# Patient Record
Sex: Female | Born: 1935 | Race: White | Hispanic: No | Smoking: Former smoker
Health system: Southern US, Community
[De-identification: ages and names within clinical notes are randomized; demographics above are authoritative.]

## PROBLEM LIST (undated history)

## (undated) DIAGNOSIS — B029 Zoster without complications: Secondary | ICD-10-CM

## (undated) DIAGNOSIS — I2510 Atherosclerotic heart disease of native coronary artery without angina pectoris: Secondary | ICD-10-CM

## (undated) DIAGNOSIS — K219 Gastro-esophageal reflux disease without esophagitis: Secondary | ICD-10-CM

## (undated) DIAGNOSIS — I10 Essential (primary) hypertension: Secondary | ICD-10-CM

## (undated) DIAGNOSIS — T4145XA Adverse effect of unspecified anesthetic, initial encounter: Secondary | ICD-10-CM

## (undated) DIAGNOSIS — F329 Major depressive disorder, single episode, unspecified: Secondary | ICD-10-CM

## (undated) DIAGNOSIS — M1990 Unspecified osteoarthritis, unspecified site: Secondary | ICD-10-CM

## (undated) DIAGNOSIS — R209 Unspecified disturbances of skin sensation: Secondary | ICD-10-CM

## (undated) DIAGNOSIS — K649 Unspecified hemorrhoids: Secondary | ICD-10-CM

## (undated) DIAGNOSIS — E785 Hyperlipidemia, unspecified: Secondary | ICD-10-CM

## (undated) DIAGNOSIS — K589 Irritable bowel syndrome without diarrhea: Secondary | ICD-10-CM

## (undated) DIAGNOSIS — C801 Malignant (primary) neoplasm, unspecified: Secondary | ICD-10-CM

## (undated) DIAGNOSIS — G4700 Insomnia, unspecified: Secondary | ICD-10-CM

## (undated) DIAGNOSIS — R0600 Dyspnea, unspecified: Secondary | ICD-10-CM

## (undated) DIAGNOSIS — J449 Chronic obstructive pulmonary disease, unspecified: Secondary | ICD-10-CM

## (undated) DIAGNOSIS — Z8719 Personal history of other diseases of the digestive system: Secondary | ICD-10-CM

## (undated) DIAGNOSIS — K5730 Diverticulosis of large intestine without perforation or abscess without bleeding: Secondary | ICD-10-CM

## (undated) DIAGNOSIS — M25559 Pain in unspecified hip: Secondary | ICD-10-CM

## (undated) DIAGNOSIS — E039 Hypothyroidism, unspecified: Secondary | ICD-10-CM

## (undated) DIAGNOSIS — M545 Low back pain: Secondary | ICD-10-CM

## (undated) DIAGNOSIS — I700 Atherosclerosis of aorta: Secondary | ICD-10-CM

## (undated) DIAGNOSIS — D649 Anemia, unspecified: Secondary | ICD-10-CM

## (undated) DIAGNOSIS — T8859XA Other complications of anesthesia, initial encounter: Secondary | ICD-10-CM

## (undated) DIAGNOSIS — F411 Generalized anxiety disorder: Secondary | ICD-10-CM

## (undated) DIAGNOSIS — R51 Headache: Secondary | ICD-10-CM

## (undated) HISTORY — DX: Pain in unspecified hip: M25.559

## (undated) HISTORY — PX: HEMORRHOID SURGERY: SHX153

## (undated) HISTORY — DX: Unspecified disturbances of skin sensation: R20.9

## (undated) HISTORY — DX: Hypothyroidism, unspecified: E03.9

## (undated) HISTORY — PX: BILATERAL SALPINGOOPHORECTOMY: SHX1223

## (undated) HISTORY — DX: Headache: R51

## (undated) HISTORY — DX: Diverticulosis of large intestine without perforation or abscess without bleeding: K57.30

## (undated) HISTORY — PX: TONSILLECTOMY: SHX5217

## (undated) HISTORY — DX: Essential (primary) hypertension: I10

## (undated) HISTORY — DX: Insomnia, unspecified: G47.00

## (undated) HISTORY — DX: Major depressive disorder, single episode, unspecified: F32.9

## (undated) HISTORY — DX: Generalized anxiety disorder: F41.1

## (undated) HISTORY — PX: APPENDECTOMY: SHX54

## (undated) HISTORY — DX: Gastro-esophageal reflux disease without esophagitis: K21.9

## (undated) HISTORY — DX: Low back pain: M54.5

## (undated) HISTORY — DX: Personal history of other diseases of the digestive system: Z87.19

## (undated) HISTORY — PX: ABDOMINAL HYSTERECTOMY: SHX81

## (undated) HISTORY — DX: Irritable bowel syndrome, unspecified: K58.9

## (undated) HISTORY — DX: Hyperlipidemia, unspecified: E78.5

---

## 1999-06-26 ENCOUNTER — Inpatient Hospital Stay (HOSPITAL_COMMUNITY): Admission: EM | Admit: 1999-06-26 | Discharge: 1999-06-27 | Payer: Self-pay | Admitting: Emergency Medicine

## 1999-06-26 ENCOUNTER — Encounter: Payer: Self-pay | Admitting: Gastroenterology

## 1999-09-16 ENCOUNTER — Encounter: Admission: RE | Admit: 1999-09-16 | Discharge: 1999-09-16 | Payer: Self-pay | Admitting: Gastroenterology

## 2000-01-10 ENCOUNTER — Encounter: Admission: RE | Admit: 2000-01-10 | Discharge: 2000-01-10 | Payer: Self-pay | Admitting: *Deleted

## 2000-01-10 ENCOUNTER — Encounter: Payer: Self-pay | Admitting: *Deleted

## 2000-01-17 ENCOUNTER — Encounter: Admission: RE | Admit: 2000-01-17 | Discharge: 2000-01-17 | Payer: Self-pay | Admitting: *Deleted

## 2000-01-17 ENCOUNTER — Encounter: Payer: Self-pay | Admitting: *Deleted

## 2000-07-29 ENCOUNTER — Encounter (INDEPENDENT_AMBULATORY_CARE_PROVIDER_SITE_OTHER): Payer: Self-pay | Admitting: Specialist

## 2000-07-29 ENCOUNTER — Ambulatory Visit (HOSPITAL_COMMUNITY): Admission: RE | Admit: 2000-07-29 | Discharge: 2000-07-29 | Payer: Self-pay | Admitting: Gastroenterology

## 2001-04-02 ENCOUNTER — Encounter: Payer: Self-pay | Admitting: Emergency Medicine

## 2001-04-02 ENCOUNTER — Emergency Department (HOSPITAL_COMMUNITY): Admission: EM | Admit: 2001-04-02 | Discharge: 2001-04-02 | Payer: Self-pay | Admitting: Emergency Medicine

## 2004-09-27 ENCOUNTER — Encounter: Admission: RE | Admit: 2004-09-27 | Discharge: 2004-09-27 | Payer: Self-pay | Admitting: Internal Medicine

## 2004-10-01 ENCOUNTER — Encounter: Admission: RE | Admit: 2004-10-01 | Discharge: 2004-10-01 | Payer: Self-pay | Admitting: Internal Medicine

## 2004-12-08 DIAGNOSIS — B029 Zoster without complications: Secondary | ICD-10-CM

## 2004-12-08 HISTORY — DX: Zoster without complications: B02.9

## 2004-12-20 ENCOUNTER — Encounter (INDEPENDENT_AMBULATORY_CARE_PROVIDER_SITE_OTHER): Payer: Self-pay | Admitting: *Deleted

## 2004-12-20 ENCOUNTER — Inpatient Hospital Stay (HOSPITAL_COMMUNITY): Admission: RE | Admit: 2004-12-20 | Discharge: 2004-12-22 | Payer: Self-pay | Admitting: Obstetrics and Gynecology

## 2004-12-23 ENCOUNTER — Inpatient Hospital Stay (HOSPITAL_COMMUNITY): Admission: AD | Admit: 2004-12-23 | Discharge: 2004-12-23 | Payer: Self-pay | Admitting: Obstetrics and Gynecology

## 2005-11-22 ENCOUNTER — Emergency Department (HOSPITAL_COMMUNITY): Admission: EM | Admit: 2005-11-22 | Discharge: 2005-11-22 | Payer: Self-pay | Admitting: Emergency Medicine

## 2005-12-27 ENCOUNTER — Inpatient Hospital Stay (HOSPITAL_COMMUNITY): Admission: EM | Admit: 2005-12-27 | Discharge: 2006-01-02 | Payer: Self-pay | Admitting: Emergency Medicine

## 2006-09-22 ENCOUNTER — Encounter: Admission: RE | Admit: 2006-09-22 | Discharge: 2006-09-22 | Payer: Self-pay | Admitting: Gastroenterology

## 2006-09-25 ENCOUNTER — Encounter: Admission: RE | Admit: 2006-09-25 | Discharge: 2006-09-25 | Payer: Self-pay | Admitting: Gastroenterology

## 2006-10-06 ENCOUNTER — Encounter: Payer: Self-pay | Admitting: Internal Medicine

## 2007-01-20 ENCOUNTER — Encounter: Admission: RE | Admit: 2007-01-20 | Discharge: 2007-01-20 | Payer: Self-pay | Admitting: Internal Medicine

## 2007-04-28 ENCOUNTER — Encounter: Admission: RE | Admit: 2007-04-28 | Discharge: 2007-04-28 | Payer: Self-pay | Admitting: Internal Medicine

## 2007-11-19 ENCOUNTER — Encounter: Admission: RE | Admit: 2007-11-19 | Discharge: 2007-11-19 | Payer: Self-pay | Admitting: Internal Medicine

## 2009-03-21 ENCOUNTER — Encounter: Payer: Self-pay | Admitting: Internal Medicine

## 2009-07-23 ENCOUNTER — Ambulatory Visit: Payer: Self-pay | Admitting: Internal Medicine

## 2009-07-23 DIAGNOSIS — K5730 Diverticulosis of large intestine without perforation or abscess without bleeding: Secondary | ICD-10-CM | POA: Insufficient documentation

## 2009-07-23 DIAGNOSIS — E785 Hyperlipidemia, unspecified: Secondary | ICD-10-CM | POA: Insufficient documentation

## 2009-07-23 DIAGNOSIS — F411 Generalized anxiety disorder: Secondary | ICD-10-CM

## 2009-07-23 DIAGNOSIS — I10 Essential (primary) hypertension: Secondary | ICD-10-CM | POA: Insufficient documentation

## 2009-07-23 DIAGNOSIS — R519 Headache, unspecified: Secondary | ICD-10-CM | POA: Insufficient documentation

## 2009-07-23 DIAGNOSIS — R51 Headache: Secondary | ICD-10-CM | POA: Insufficient documentation

## 2009-07-23 DIAGNOSIS — F329 Major depressive disorder, single episode, unspecified: Secondary | ICD-10-CM

## 2009-07-23 DIAGNOSIS — K219 Gastro-esophageal reflux disease without esophagitis: Secondary | ICD-10-CM

## 2009-07-23 DIAGNOSIS — E039 Hypothyroidism, unspecified: Secondary | ICD-10-CM | POA: Insufficient documentation

## 2009-07-23 DIAGNOSIS — F419 Anxiety disorder, unspecified: Secondary | ICD-10-CM | POA: Insufficient documentation

## 2009-07-23 DIAGNOSIS — F3289 Other specified depressive episodes: Secondary | ICD-10-CM

## 2009-07-23 HISTORY — DX: Generalized anxiety disorder: F41.1

## 2009-07-23 HISTORY — DX: Other specified depressive episodes: F32.89

## 2009-07-23 HISTORY — DX: Hypothyroidism, unspecified: E03.9

## 2009-07-23 HISTORY — DX: Gastro-esophageal reflux disease without esophagitis: K21.9

## 2009-07-23 HISTORY — DX: Headache: R51

## 2009-07-23 HISTORY — DX: Hyperlipidemia, unspecified: E78.5

## 2009-07-23 HISTORY — DX: Major depressive disorder, single episode, unspecified: F32.9

## 2009-07-23 HISTORY — DX: Diverticulosis of large intestine without perforation or abscess without bleeding: K57.30

## 2009-07-23 HISTORY — DX: Essential (primary) hypertension: I10

## 2009-08-21 ENCOUNTER — Ambulatory Visit: Payer: Self-pay | Admitting: Internal Medicine

## 2009-10-01 ENCOUNTER — Ambulatory Visit: Payer: Self-pay | Admitting: Internal Medicine

## 2009-10-01 DIAGNOSIS — R209 Unspecified disturbances of skin sensation: Secondary | ICD-10-CM

## 2009-10-01 DIAGNOSIS — M545 Low back pain, unspecified: Secondary | ICD-10-CM

## 2009-10-01 HISTORY — DX: Unspecified disturbances of skin sensation: R20.9

## 2009-10-01 HISTORY — DX: Low back pain, unspecified: M54.50

## 2010-01-07 ENCOUNTER — Ambulatory Visit: Payer: Self-pay | Admitting: Internal Medicine

## 2010-01-07 ENCOUNTER — Telehealth: Payer: Self-pay | Admitting: Internal Medicine

## 2010-01-07 DIAGNOSIS — N390 Urinary tract infection, site not specified: Secondary | ICD-10-CM | POA: Insufficient documentation

## 2010-01-07 LAB — CONVERTED CEMR LAB
Bilirubin Urine: NEGATIVE
Glucose, Urine, Semiquant: NEGATIVE
Ketones, urine, test strip: NEGATIVE
Nitrite: POSITIVE
Specific Gravity, Urine: <1.005
Urobilinogen, UA: 0.2
pH: 7

## 2010-01-10 ENCOUNTER — Encounter: Payer: Self-pay | Admitting: Internal Medicine

## 2010-01-10 ENCOUNTER — Telehealth (INDEPENDENT_AMBULATORY_CARE_PROVIDER_SITE_OTHER): Payer: Self-pay

## 2010-01-15 ENCOUNTER — Encounter: Admission: RE | Admit: 2010-01-15 | Discharge: 2010-01-15 | Payer: Self-pay | Admitting: Internal Medicine

## 2010-01-17 ENCOUNTER — Telehealth: Payer: Self-pay | Admitting: Internal Medicine

## 2010-02-04 ENCOUNTER — Telehealth: Payer: Self-pay | Admitting: Internal Medicine

## 2010-03-25 ENCOUNTER — Telehealth: Payer: Self-pay | Admitting: Internal Medicine

## 2010-04-11 ENCOUNTER — Telehealth: Payer: Self-pay | Admitting: Internal Medicine

## 2010-04-16 ENCOUNTER — Ambulatory Visit: Payer: Self-pay | Admitting: Internal Medicine

## 2010-05-16 ENCOUNTER — Telehealth: Payer: Self-pay | Admitting: Internal Medicine

## 2010-05-16 DIAGNOSIS — M25559 Pain in unspecified hip: Secondary | ICD-10-CM | POA: Insufficient documentation

## 2010-05-16 HISTORY — DX: Pain in unspecified hip: M25.559

## 2010-05-22 ENCOUNTER — Ambulatory Visit: Payer: Self-pay | Admitting: Internal Medicine

## 2010-06-13 ENCOUNTER — Telehealth: Payer: Self-pay | Admitting: Internal Medicine

## 2010-07-25 ENCOUNTER — Ambulatory Visit: Payer: Self-pay | Admitting: Internal Medicine

## 2010-08-20 ENCOUNTER — Encounter: Payer: Self-pay | Admitting: Internal Medicine

## 2010-08-21 ENCOUNTER — Encounter: Payer: Self-pay | Admitting: Internal Medicine

## 2010-09-17 ENCOUNTER — Telehealth: Payer: Self-pay | Admitting: Internal Medicine

## 2010-11-04 ENCOUNTER — Telehealth: Payer: Self-pay | Admitting: Internal Medicine

## 2010-11-15 ENCOUNTER — Encounter: Payer: Self-pay | Admitting: Internal Medicine

## 2010-11-15 ENCOUNTER — Ambulatory Visit: Payer: Self-pay | Admitting: Internal Medicine

## 2010-11-15 LAB — CONVERTED CEMR LAB
ALT: 9 units/L (ref 0–35)
AST: 18 units/L (ref 0–37)
Albumin: 4.2 g/dL (ref 3.5–5.2)
Alkaline Phosphatase: 56 units/L (ref 39–117)
BUN: 10 mg/dL (ref 6–23)
Basophils Absolute: 0 10*3/uL (ref 0.0–0.1)
Basophils Relative: 0.5 % (ref 0.0–3.0)
Bilirubin Urine: NEGATIVE
Bilirubin, Direct: 0.1 mg/dL (ref 0.0–0.3)
CO2: 30 meq/L (ref 19–32)
Calcium: 9.4 mg/dL (ref 8.4–10.5)
Chloride: 101 meq/L (ref 96–112)
Cholesterol: 200 mg/dL (ref 0–200)
Creatinine, Ser: 0.8 mg/dL (ref 0.4–1.2)
Eosinophils Absolute: 0.1 10*3/uL (ref 0.0–0.7)
Eosinophils Relative: 1.2 % (ref 0.0–5.0)
GFR calc non Af Amer: 80.42 mL/min (ref >60.00)
Glucose, Bld: 92 mg/dL (ref 70–99)
Glucose, Urine, Semiquant: NEGATIVE
HCT: 35.3 % — ABNORMAL LOW (ref 36.0–46.0)
HDL: 107.9 mg/dL (ref >39.00)
Hemoglobin: 11.7 g/dL — ABNORMAL LOW (ref 12.0–15.0)
Ketones, urine, test strip: NEGATIVE
LDL Cholesterol: 56 mg/dL (ref 0–99)
Lymphocytes Relative: 30.3 % (ref 12.0–46.0)
Lymphs Abs: 2.4 10*3/uL (ref 0.7–4.0)
MCHC: 33.2 g/dL (ref 30.0–36.0)
MCV: 97.3 fL (ref 78.0–100.0)
Monocytes Absolute: 0.6 10*3/uL (ref 0.1–1.0)
Monocytes Relative: 7.2 % (ref 3.0–12.0)
Neutro Abs: 4.8 10*3/uL (ref 1.4–7.7)
Neutrophils Relative %: 60.8 % (ref 43.0–77.0)
Nitrite: NEGATIVE
Platelets: 346 10*3/uL (ref 150.0–400.0)
Potassium: 3.9 meq/L (ref 3.5–5.1)
Protein, U semiquant: NEGATIVE
RBC: 3.63 M/uL — ABNORMAL LOW (ref 3.87–5.11)
RDW: 13.6 % (ref 11.5–14.6)
Sodium: 141 meq/L (ref 135–145)
Specific Gravity, Urine: 1.01
TSH: 1.05 microintl units/mL (ref 0.35–5.50)
Total Bilirubin: 0.5 mg/dL (ref 0.3–1.2)
Total CHOL/HDL Ratio: 2
Total Protein: 7.2 g/dL (ref 6.0–8.3)
Triglycerides: 182 mg/dL — ABNORMAL HIGH (ref 0.0–149.0)
Urobilinogen, UA: 0.2
VLDL: 36.4 mg/dL (ref 0.0–40.0)
WBC: 7.9 10*3/uL (ref 4.5–10.5)
pH: 6.5

## 2010-11-28 ENCOUNTER — Telehealth: Payer: Self-pay | Admitting: Internal Medicine

## 2010-12-05 ENCOUNTER — Telehealth: Payer: Self-pay

## 2010-12-19 ENCOUNTER — Telehealth: Payer: Self-pay | Admitting: Internal Medicine

## 2010-12-28 ENCOUNTER — Encounter: Payer: Self-pay | Admitting: Internal Medicine

## 2010-12-29 ENCOUNTER — Encounter: Payer: Self-pay | Admitting: Gastroenterology

## 2010-12-31 ENCOUNTER — Ambulatory Visit
Admission: RE | Admit: 2010-12-31 | Discharge: 2010-12-31 | Payer: Self-pay | Source: Home / Self Care | Attending: Internal Medicine | Admitting: Internal Medicine

## 2010-12-31 DIAGNOSIS — J069 Acute upper respiratory infection, unspecified: Secondary | ICD-10-CM | POA: Insufficient documentation

## 2011-01-09 NOTE — Progress Notes (Signed)
Summary: [facility] clarification  Phone Note From Pharmacy   Caller: [facility] Summary of Call: nexium order for packets - has alway gotten caps.   change faxed back to [facility] authing cap. Med list changed. KIK Initial call taken by: Cay Schillings LPN,  December 29, 624THL 12:21 PM    New/Updated Medications: * NEXIUM 40 MG  CAPSULES (ESOMEPRAZOLE MAGNESIUM) 1 once daily

## 2011-01-09 NOTE — Progress Notes (Signed)
Summary: refill triazolam  Phone Note Refill Request Message from:  Fax from Pharmacy on December 19, 2010 12:17 PM  Refills Requested: Medication #1:  TRIAZOLAM 0.25 MG TABS 1 or 2 at bedtime as needed [facility]   Method Requested: Fax to [facility] Initial call taken by: Cay Schillings LPN,  January 12, [id number] 12:18 PM    Prescriptions: TRIAZOLAM 0.25 MG TABS (TRIAZOLAM) 1 or 2 at bedtime as needed  #60 x 2   Entered by:   Cay Schillings LPN   Authorized by:   [name]  MD   Signed by:   Cay Schillings LPN on [id number]   Method used:   Historical   [id number]  faxed back to [facility]

## 2011-01-09 NOTE — Progress Notes (Signed)
Summary: request to increase med - denied  Phone Note Refill Request Message from:  Fax from Pharmacy on November 04, 2010 4:00 PM  Refills Requested: Medication #1:  CLORAZEPATE DIPOTASSIUM 7.5 MG TABS two times a day as needed   Last Refilled: 09/10/2010 [facility] faxed request that pt is requesting to increase to three times a day -   please advise. KIK   Method Requested: Fax to [facility] Initial call taken by: Cay Schillings LPN,  November 28, 624THL 4:01 PM  Follow-up for Phone Call        continue BID Follow-up by: [name]  MD,  November 04, 2010 5:37 PM  Additional Follow-up for Phone Call Additional follow up Details #1::        faxed denial for three times a day back to pharmacy Additional Follow-up by: Cay Schillings LPN,  November 28, 624THL 5:39 PM

## 2011-01-09 NOTE — Miscellaneous (Signed)
Summary: flu vaccine  Clinical Lists Changes  Observations: Added new observation of FLU VAX: Historical (08/20/2010 9:44)      Immunization History:  Influenza Immunization History:    Influenza:  Historical (08/20/2010)

## 2011-01-09 NOTE — Progress Notes (Signed)
Summary: refill triazolam  Phone Note Refill Request Message from:  Patient on KERR [phone]  Refills Requested: Medication #1:  TRIAZOLAM 0.25 MG TABS 1 or 2 at bedtime as needed Initial call taken by: Glo Herring,  September 17, 2010 12:59 PM    Prescriptions: TRIAZOLAM 0.25 MG TABS (TRIAZOLAM) 1 or 2 at bedtime as needed  #60 x 2   Entered by:   Cay Schillings LPN   Authorized by:   [name]  MD   Signed by:   Cay Schillings LPN on [id number]   Method used:   Historical   [id number]  called [facility]    kik

## 2011-01-09 NOTE — Progress Notes (Signed)
Summary: REFILL  Phone Note Refill Request Message from:  Fax from Pharmacy  Refills Requested: Medication #1:  ESTROPIPATE 1.5 MG TABS 1 qam [facility] FAX---[phone]  Initial call taken by: Despina Arias,  February 04, 2010 12:30 PM    Prescriptions: ESTROPIPATE 1.5 MG TABS (ESTROPIPATE) 1 qam  #90 x 3   Entered by:   Cay Schillings LPN   Authorized by:   [name]  MD   Signed by:   Cay Schillings LPN on 075-GRM   Method used:   Electronically to        [facility] (mail-order)             ,          Ph: [phone]       Fax: [phone]   [id number]

## 2011-01-09 NOTE — Assessment & Plan Note (Signed)
Summary: URINARY RETENTION (ISCHURIA?) // RS   Vital Signs:  Patient profile:   76 year old female Weight:      107 pounds Temp:     98.5 degrees F oral BP standing:   126 / 64  (left arm) Cuff size:   regular  Vitals Entered By: Chipper Oman, RN (January 07, 2010 1:13 PM) CC: C/o difficulty voiding- has cathed herself 3x over weekend and also constipated- took laxative yesterday. Abd bloated and backache.   CC:  C/o difficulty voiding- has cathed herself 3x over weekend and also constipated- took laxative yesterday. Abd bloated and backache..  History of Present Illness: 76 year old patient who is seen today complaining of voiding difficulties, dysuria, bloating.  She has noted a foul odor to the urine.  She has seen urology in the past and has been treated for acute urinary retention.  She was told years ago, that she would require self catheterizations, and definitely. however, after several months she started voiding spontaneously, and has done fairly well until the past few days when she has had resumed self catheterizations.  She has a history of chronic low back pain.  No extremity paresthesias and also chronic constipation.  She was placed on the last visit, and she states she has had a very nice clinical response.  Allergies: 1)  ! Cymbalta (Duloxetine Hcl) 2)  ! Vioxx 3)  ! Metronidazole (Metronidazole)  Past History:  Past Medical History: Reviewed history from 10/01/2009 and no changes required. Depression Hyperlipidemia Hypertension chronic pain syndrome paresthesias of the legs GERD Hypothyroidism Anxiety insomnia menopausal syndrome Diverticulosis, colon IBS Headache history of acute urinary retention chronic constipation history of Crohn's colitis shingles 10/06 IGT   Past Surgical History: Reviewed history from 10/01/2009 and no changes required. colonoscopy 2008 Appendectomy Hysterectomy age 89 Tonsillectomy   abdominal pelvic CT December  2008  Review of Systems       The patient complains of anorexia, abdominal pain, and depression.  The patient denies fever, weight loss, weight gain, vision loss, decreased hearing, hoarseness, chest pain, syncope, dyspnea on exertion, peripheral edema, prolonged cough, headaches, hemoptysis, melena, hematochezia, severe indigestion/heartburn, hematuria, incontinence, genital sores, muscle weakness, suspicious skin lesions, transient blindness, difficulty walking, unusual weight change, abnormal bleeding, enlarged lymph nodes, and angioedema.    Physical Exam  General:  Well-developed,well-nourished,in no acute distress; alert,appropriate and cooperative throughout examination; normal blood pressure Head:  Normocephalic and atraumatic without obvious abnormalities. No apparent alopecia or balding. Eyes:  No corneal or conjunctival inflammation noted. EOMI. Perrla. Funduscopic exam benign, without hemorrhages, exudates or papilledema. Vision grossly normal. Mouth:  Oral mucosa and oropharynx without lesions or exudates.  Teeth in good repair. Neck:  No deformities, masses, or tenderness noted. Lungs:  Normal respiratory effort, chest expands symmetrically. Lungs are clear to auscultation, no crackles or wheezes. Heart:  Normal rate and regular rhythm. S1 and S2 normal without gallop, murmur, click, rub or other extra sounds. Abdomen:  Bowel sounds positive,abdomen soft and non-tender without masses, organomegaly or hernias noted.  no obvious urinary retention Msk:  No deformity or scoliosis noted of thoracic or lumbar spine.   Neurologic:  able to easily stand from a sitting position walk across the room with a normal appearing gait   Impression & Recommendations:  Problem # 1:  UTI (ICD-599.0)  Her updated medication list for this problem includes:    Levaquin 500 Mg Tabs (Levofloxacin) ..... One daily  Problem # 2:  LOW BACK PAIN, CHRONIC (ICD-724.2)  Her updated medication list for  this problem includes:    Hydrocodone-acetaminophen 5-500 Mg Tabs (Hydrocodone-acetaminophen) ..... One every 6 hours as needed for pain will check a lumbar MRI; increased e to a b.i.d. regimen  Problem # 3:  PARESTHESIA (ICD-782.0) will check a lumbar MRI  Problem # 4:  HYPERTENSION (ICD-401.9)  Her updated medication list for this problem includes:    Diovan Hct 160-25 Mg Tabs (Valsartan-hydrochlorothiazide) .[name]... 1 once daily    Diltiazem Hcl Er Beads 240 Mg Xr24h-cap (Diltiazem hcl er beads) .[name]... 1 once daily  Complete Medication List: 1)  Diovan Hct 160-25 Mg Tabs (Valsartan-hydrochlorothiazide) .[name].. 1 once daily 2)  Lipitor 20 Mg Tabs (Atorvastatin calcium) .[name].. 1 once daily 3)  Lyrica 50 Mg Caps (Pregabalin) .[name].. 1  twice daily 4)  Nexium 40 Mg Pack (Esomeprazole magnesium) .[name].. 1 once daily 5)  Synthroid 25 Mcg Tabs (Levothyroxine sodium) .[name].. 1 once daily 6)  Triazolam 0.25 Mg Tabs (Triazolam) .[name].. 1 or 2 at bedtime as needed 7)  Estropipate 1.5 Mg Tabs (Estropipate) .[name].. 1 qam 8)  Diltiazem Hcl Er Beads 240 Mg Xr24h-cap (Diltiazem hcl er beads) .[name].. 1 once daily 9)  Clorazepate Dipotassium 7.5 Mg Tabs (Clorazepate dipotassium) .... Two times a day as needed 10)  Hydrocodone-acetaminophen 5-500 Mg Tabs (Hydrocodone-acetaminophen) .... One every 6 hours as needed for pain 11)  Promethazine Hcl 25 Mg Tabs (Promethazine hcl) .... One every 6 hours as needed for nausea 12)  Levaquin 500 Mg Tabs (Levofloxacin) .... One daily  Other Orders: UA Dipstick w/o Micro (manual) [id number])  Patient Instructions: 1)  Please schedule a follow-up appointment in 3 months. 2)  Limit your Sodium (Salt). 3)  Take your antibiotic as prescribed until ALL of it is gone, but stop if you develop a rash or swelling and contact our office as soon as possible. 4)  Lumbar MRI as scheduled Prescriptions: LYRICA 50 MG CAPS (PREGABALIN) 1  twice daily  #180 x 4   Entered and Authorized by:   [name]  MD   Signed by:   [name]  MD on 01/07/2010   Method used:   Print then Give to Patient   RxID:   [id number]   Laboratory Results   Urine Tests    Routine Urinalysis   Color: yellow Appearance: Cloudy Glucose: negative   (Normal Range: Negative) Bilirubin: negative   (Normal Range: Negative) Ketone: negative   (Normal Range: Negative) Spec. Gravity: <1.005   (Normal Range: 1.003-1.035) Blood: trace-lysed   (Normal Range: Negative) pH: 7.0   (Normal Range: 5.0-8.0) Protein: trace   (Normal Range: Negative) Urobilinogen: 0.2   (Normal Range: 0-1) Nitrite: positive   (Normal Range: Negative) Leukocyte Esterace: moderate   (Normal Range: Negative)

## 2011-01-09 NOTE — Assessment & Plan Note (Signed)
Summary: cough/congestion/cjr   Vital Signs:  Patient profile:   76 year old female Weight:      124 pounds O2 Sat:      96 % on Room air Temp:     98.2 degrees F oral Pulse rate:   70 / minute BP sitting:   112 / 70  (left arm) Cuff size:   regular  Vitals Entered By: Cay Schillings LPN (January 24, [id number] 12:56 PM)  O2 Flow:  Room air CC: c/o head and chest congestion , cough  Is Patient Diabetic? No   CC:  c/o head and chest congestion  and cough .  History of Present Illness: 76 year old patient who presents with a 3-day history of sore throats cough, minimally productive of white sputum.  She has a history of hypertension and dyslipidemia.  There's been no fever or shortness of breath.  Denies any wheezing.    Allergies: 1)  ! Cymbalta (Duloxetine Hcl) 2)  ! Vioxx 3)  ! Metronidazole (Metronidazole)  Past History:  Past Medical History: Reviewed history from 10/01/2009 and no changes required. Depression Hyperlipidemia Hypertension chronic pain syndrome paresthesias of the legs GERD Hypothyroidism Anxiety insomnia menopausal syndrome Diverticulosis, colon IBS Headache history of acute urinary retention chronic constipation history of Crohn's colitis shingles 10/06 IGT   Review of Systems       The patient complains of prolonged cough.  The patient denies anorexia, fever, weight loss, weight gain, vision loss, decreased hearing, hoarseness, chest pain, syncope, dyspnea on exertion, peripheral edema, headaches, hemoptysis, abdominal pain, melena, hematochezia, severe indigestion/heartburn, hematuria, incontinence, genital sores, muscle weakness, suspicious skin lesions, transient blindness, difficulty walking, depression, unusual weight change, abnormal bleeding, enlarged lymph nodes, angioedema, and breast masses.    Physical Exam  General:  Well-developed,well-nourished,in no acute distress; alert,appropriate and cooperative throughout  examination Head:  Normocephalic and atraumatic without obvious abnormalities. No apparent alopecia or balding. Eyes:  No corneal or conjunctival inflammation noted. EOMI. Perrla. Funduscopic exam benign, without hemorrhages, exudates or papilledema. Vision grossly normal. Ears:  External ear exam shows no significant lesions or deformities.  Otoscopic examination reveals clear canals, tympanic membranes are intact bilaterally without bulging, retraction, inflammation or discharge. Hearing is grossly normal bilaterally. Mouth:  Oral mucosa and oropharynx without lesions or exudates.  Teeth in good repair. Neck:  No deformities, masses, or tenderness noted. Lungs:  few scattered rhonchi O2 saturation 96%normal respiratory effort, no intercostal retractions, and no accessory muscle use.   Heart:  Normal rate and regular rhythm. S1 and S2 normal without gallop, murmur, click, rub or other extra sounds. Abdomen:  Bowel sounds positive,abdomen soft and non-tender without masses, organomegaly or hernias noted. Msk:  No deformity or scoliosis noted of thoracic or lumbar spine.     Impression & Recommendations:  Problem # 1:  URI (ICD-465.9)  Her updated medication list for this problem includes:    Promethazine Hcl 25 Mg Tabs (Promethazine hcl) ..... One every 6 hours as needed for nausea  Problem # 2:  HYPERTENSION (ICD-401.9)  Her updated medication list for this problem includes:    Diovan Hct 160-25 Mg Tabs (Valsartan-hydrochlorothiazide) .[name]... 1 once daily    Diltiazem Hcl Er Beads 240 Mg Xr24h-cap (Diltiazem hcl er beads) .[name]... 1 once daily  Complete Medication List: 1)  Diovan Hct 160-25 Mg Tabs (Valsartan-hydrochlorothiazide) .[name].. 1 once daily 2)  Lipitor 20 Mg Tabs (Atorvastatin calcium) .[name].. 1 once daily 3)  Lyrica 50 Mg Caps (Pregabalin) .[name].. 1  twice daily 4)  Nexium 40 Mg Capsules (esomeprazole Magnesium)  .[name].. 1 once daily 5)  Synthroid 25 Mcg Tabs (Levothyroxine sodium) .[name].. 1  once daily 6)  Triazolam 0.25 Mg Tabs (Triazolam) .[name].. 1 or 2 at bedtime as needed 7)  Estropipate 1.5 Mg Tabs (Estropipate) .[name].. 1 qam 8)  Diltiazem Hcl Er Beads 240 Mg Xr24h-cap (Diltiazem hcl er beads) .[name].. 1 once daily 9)  Clorazepate Dipotassium 7.5 Mg Tabs (Clorazepate dipotassium) .... Two times a day as needed 10)  Promethazine Hcl 25 Mg Tabs (Promethazine hcl) .... One every 6 hours as needed for nausea 11)  Hydrocodone-acetaminophen 5-500 Mg Tabs (Hydrocodone-acetaminophen) .... One every 6  hours for pain  Patient Instructions: 1)  Get plenty of rest, drink lots of clear liquids, and use Tylenol or Ibuprofen for fever and comfort. Return in 7-10 days if you're not better:sooner if you're feeling worse.   Orders Added: 1)  Est. Patient Level III [id number]

## 2011-01-09 NOTE — Progress Notes (Signed)
Summary: ? UTI?  Phone Note Call from Patient   Caller: Patient Call For: [name]  MD Summary of Call: Voice mail from patient requests a call regarding Urinary and bowel problems.  [phone] No answer, and no voice mail when call was returned. Initial call taken by: Deanna Artis CMA,  January 07, 2010 9:24 AM  Follow-up for Phone Call        Appt scheduled. Follow-up by: Deanna Artis CMA,  January 07, 2010 10:27 AM

## 2011-01-09 NOTE — Progress Notes (Signed)
Summary: refill clorazepate  Phone Note Refill Request Message from:  Fax from Pharmacy on March 25, 2010 2:35 PM  Refills Requested: Medication #1:  CLORAZEPATE DIPOTASSIUM 7.5 MG TABS two times a day as needed  Method Requested: Telephone to Pharmacy Initial call taken by: Cay Schillings LPN,  April 18, [id number] 2:35 PM    Prescriptions: CLORAZEPATE DIPOTASSIUM 7.5 MG TABS (CLORAZEPATE DIPOTASSIUM) two times a day as needed  #90 x 3   Entered by:   Cay Schillings LPN   Authorized by:   [name]  MD   Signed by:   Cay Schillings LPN on [id number]   Method used:   Historical   [id number]  #90  one two times a day as needed RF 2  called to [place]

## 2011-01-09 NOTE — Assessment & Plan Note (Signed)
Summary: 3 MTH ROV // RS   Vital Signs:  Patient profile:   76 year old female Weight:      113 pounds Temp:     97.7 degrees F oral BP sitting:   98 / 62  (left arm) Cuff size:   regular  Vitals Entered By: Cay Schillings LPN (May 10, 624THL 624THL AM) CC: 3 mos rov - congested cough - productive and waking  Is Patient Diabetic? No   CC:  3 mos rov - congested cough - productive and waking .  History of Present Illness: 76 year old patient who is seen today for follow-up.  She has multiple complaints, but her chief complaint is worsening left hip pain.  She did have a MRI of the lumbar spine revealed considerable posture arthritic changes.  She feels that she is not ready to consider total hip replacement surgery.  She wishes to try a different analgesic due to sedation with hydrocodone.  She does not take any  anti-inflammatory medications.  Also, complains of some mild allergy related symptoms.  She has dyslipidemia and hypertension.  She remains on DiovanHCT, with nice, blood pressure control is also on Cardizem 240 mg daily  Allergies: 1)  ! Cymbalta (Duloxetine Hcl) 2)  ! Vioxx 3)  ! Metronidazole (Metronidazole)  Past History:  Past Medical History: Reviewed history from 10/01/2009 and no changes required. Depression Hyperlipidemia Hypertension chronic pain syndrome paresthesias of the legs GERD Hypothyroidism Anxiety insomnia menopausal syndrome Diverticulosis, colon IBS Headache history of acute urinary retention chronic constipation history of Crohn's colitis shingles 10/06 IGT   Review of Systems       The patient complains of hoarseness, difficulty walking, and depression.  The patient denies anorexia, fever, weight loss, weight gain, vision loss, decreased hearing, chest pain, syncope, dyspnea on exertion, peripheral edema, prolonged cough, headaches, hemoptysis, abdominal pain, melena, hematochezia, severe indigestion/heartburn, hematuria,  incontinence, genital sores, muscle weakness, suspicious skin lesions, transient blindness, unusual weight change, abnormal bleeding, enlarged lymph nodes, angioedema, and breast masses.    Physical Exam  General:  Well-developed,well-nourished,in no acute distress; alert,appropriate and cooperative throughout examination Head:  Normocephalic and atraumatic without obvious abnormalities. No apparent alopecia or balding. Eyes:  No corneal or conjunctival inflammation noted. EOMI. Perrla. Funduscopic exam benign, without hemorrhages, exudates or papilledema. Vision grossly normal. Mouth:  Oral mucosa and oropharynx without lesions or exudates.  Teeth in good repair. Neck:  No deformities, masses, or tenderness noted. Lungs:  Normal respiratory effort, chest expands symmetrically. Lungs are clear to auscultation, no crackles or wheezes. Heart:  Normal rate and regular rhythm. S1 and S2 normal without gallop, murmur, click, rub or other extra sounds. Abdomen:  Bowel sounds positive,abdomen soft and non-tender without masses, organomegaly or hernias noted. Msk:  diminished range of motion with discomfort of the left hip Pulses:  R and L carotid,radial,femoral,dorsalis pedis and posterior tibial pulses are full and equal bilaterally Extremities:  No clubbing, cyanosis, edema, or deformity noted with normal full range of motion of all joints.     Impression & Recommendations:  Problem # 1:  LOW BACK PAIN, CHRONIC (ICD-724.2)  The following medications were removed from the medication list:    Hydrocodone-acetaminophen 5-500 Mg Tabs (Hydrocodone-acetaminophen) ..... One every 6 hours as needed for pain Her updated medication list for this problem includes:    Tramadol Hcl 50 Mg Tabs (Tramadol hcl) ..... One every 6 hours for pain  The following medications were removed from the medication list:    Hydrocodone-acetaminophen  5-500 Mg Tabs (Hydrocodone-acetaminophen) ..... One every 6 hours as needed  for pain Her updated medication list for this problem includes:    Tramadol Hcl 50 Mg Tabs (Tramadol hcl) ..... One every 6 hours for pain  Orders: Depo- Medrol 40mg  (J1030) Admin of Therapeutic Inj  intramuscular or subcutaneous [id number])  Problem # 2:  HYPOTHYROIDISM (ICD-244.9)  Her updated medication list for this problem includes:    Synthroid 25 Mcg Tabs (Levothyroxine sodium) .[name]... 1 once daily  Her updated medication list for this problem includes:    Synthroid 25 Mcg Tabs (Levothyroxine sodium) .[name]... 1 once daily  Problem # 3:  HYPERTENSION (ICD-401.9)  Her updated medication list for this problem includes:    Diovan Hct 160-25 Mg Tabs (Valsartan-hydrochlorothiazide) .[name]... 1 once daily    Diltiazem Hcl Er Beads 240 Mg Xr24h-cap (Diltiazem hcl er beads) .[name]... 1 once daily  Her updated medication list for this problem includes:    Diovan Hct 160-25 Mg Tabs (Valsartan-hydrochlorothiazide) .[name]... 1 once daily    Diltiazem Hcl Er Beads 240 Mg Xr24h-cap (Diltiazem hcl er beads) .[name]... 1 once daily  Problem # 4:  HYPERLIPIDEMIA (ICD-272.4)  Her updated medication list for this problem includes:    Lipitor 20 Mg Tabs (Atorvastatin calcium) .[name]... 1 once daily  Her updated medication list for this problem includes:    Lipitor 20 Mg Tabs (Atorvastatin calcium) .[name]... 1 once daily  Complete Medication List: 1)  Diovan Hct 160-25 Mg Tabs (Valsartan-hydrochlorothiazide) .[name].. 1 once daily 2)  Lipitor 20 Mg Tabs (Atorvastatin calcium) .[name].. 1 once daily 3)  Lyrica 50 Mg Caps (Pregabalin) .[name].. 1  twice daily 4)  Nexium 40 Mg Pack (Esomeprazole magnesium) .[name].. 1 once daily 5)  Synthroid 25 Mcg Tabs (Levothyroxine sodium) .[name].. 1 once daily 6)  Triazolam 0.25 Mg Tabs (Triazolam) .[name].. 1 or 2 at bedtime as needed 7)  Estropipate 1.5 Mg Tabs (Estropipate) .[name].. 1 qam 8)  Diltiazem Hcl Er Beads 240 Mg Xr24h-cap (Diltiazem hcl er beads) .[name].. 1 once daily 9)  Clorazepate Dipotassium 7.5 Mg Tabs  (Clorazepate dipotassium) .... Two times a day as needed 10)  Promethazine Hcl 25 Mg Tabs (Promethazine hcl) .... One every 6 hours as needed for nausea 11)  Levaquin 500 Mg Tabs (Levofloxacin) .... One daily 12)  Tramadol Hcl 50 Mg Tabs (Tramadol hcl) .... One every 6 hours for pain  Patient Instructions: 1)  Please schedule a follow-up appointment in 3 months. 2)  Limit your Sodium (Salt) to less than 2 grams a day(slightly less than 1/2 a teaspoon) to prevent fluid retention, swelling, or worsening of symptoms. 3)  It is important that you exercise regularly at least 20 minutes 5 times a week. If you develop chest pain, have severe difficulty breathing, or feel very tired , stop exercising immediately and seek medical attention. 4)  Take 400-600mg  of Ibuprofen (Advil, Motrin) with food every 4-6 hours as needed for relief of pain or comfort of fever. Prescriptions: TRAMADOL HCL 50 MG TABS (TRAMADOL HCL) one every 6 hours for pain  #100 x 4   Entered and Authorized by:   [name]  MD   Signed by:   [name]  MD on 04/16/2010   Method used:   Print then Give to Patient   RxID:   [id number] CLORAZEPATE DIPOTASSIUM 7.5 MG TABS (CLORAZEPATE DIPOTASSIUM) two times a day as needed  #90 x 3   Entered and Authorized by:   [name]  MD   Signed by:   [name]  MD on 04/16/2010   Method used:   Print then Give to Patient   RxID:   [id number] DILTIAZEM HCL ER BEADS 240 MG XR24H-CAP (DILTIAZEM HCL ER BEADS) 1 once daily  #90 x 4   Entered and Authorized by:   [name]  MD   Signed by:   [name]  MD on 04/16/2010   Method used:   Print then Give to Patient   RxID:   [id number] SYNTHROID 25 MCG TABS (LEVOTHYROXINE SODIUM) 1 once daily  #90 x 4   Entered and Authorized by:   [name]  MD   Signed by:   [name]  MD on 04/16/2010   Method used:   Print then Give to Patient   RxID:    [id number] [place] 40 MG PACK (ESOMEPRAZOLE MAGNESIUM) 1 once daily  #90 x 4   Entered and Authorized by:   [name]  MD   Signed by:   [name]  MD on 04/16/2010   Method used:   Print then Give to Patient   RxID:   [id number] LYRICA 50 MG CAPS (PREGABALIN) 1  twice daily  #180 x 4   Entered and Authorized by:   [name]  MD   Signed by:   [name]  MD on 04/16/2010   Method used:   Print then Give to Patient   RxID:   [id number] LIPITOR 20 MG TABS (ATORVASTATIN CALCIUM) 1 once daily  #90 x 4   Entered and Authorized by:   [name]  MD   Signed by:   [name]  MD on 04/16/2010   Method used:   Print then Give to Patient   RxID:   [id number] DIOVAN HCT 160-25 MG TABS (VALSARTAN-HYDROCHLOROTHIAZIDE) 1 once daily  #90 x 4   Entered and Authorized by:   [name]  MD   Signed by:   [name]  MD on 04/16/2010   Method used:   Print then Give to Patient   RxID:   [id number]    Medication Administration  Injection # 1:    Medication: Depo- Medrol 40mg     Diagnosis: LOW BACK PAIN, CHRONIC (ICD-724.2)    Route: IM    Site: LUOQ gluteus    Exp Date: 10/2012    Lot #: [id number]    Mfr: [employer]    Patient tolerated injection without complications    Given by: Cay Schillings LPN (May 10, 624THL [id number] PM)  Orders Added: 1)  Est. Patient Level IV [id number] 2)  Depo- Medrol 40mg  [J1030] 3)  Admin of Therapeutic Inj  intramuscular or subcutaneous [id number]

## 2011-01-09 NOTE — Progress Notes (Signed)
Summary: refill triazolam  Phone Note Refill Request Message from:  Fax from Pharmacy on June 13, 2010 2:47 PM  Refills Requested: Medication #1:  TRIAZOLAM 0.25 MG TABS 1 or 2 at bedtime as needed   Last Refilled: 05/13/2010 [facility] [place]   Method Requested: Fax to [facility] Initial call taken by: Cay Schillings LPN,  July  7, 624THL 579FGE PM    Prescriptions: TRIAZOLAM 0.25 MG TABS (TRIAZOLAM) 1 or 2 at bedtime as needed  #60 x 2   Entered by:   Cay Schillings LPN   Authorized by:   [name]  MD   Signed by:   Cay Schillings LPN on [id number]   Method used:   Historical   [id number]  faxed to pharmacy. KIk

## 2011-01-09 NOTE — Assessment & Plan Note (Signed)
Summary: 3 MONTH FOLLOW UP/CJR   Vital Signs:  Patient profile:   76 year old female Weight:      116 pounds Temp:     97.7 degrees F oral BP sitting:   100 / 60  (right arm)  Vitals Entered By: Clearnce Sorrel CMA (July 25, 2010 11:29 AM) CC: 3 mth fu, needs another pain med,src   CC:  3 mth fu, needs another pain med, and src.  History of Present Illness: 76 year old patient who is seen today for follow-up.  She has a history of chronic low back pain and established with this practice.  Approximately 1 year ago.  She has hypertension, dyslipidemia, and gastroesophageal reflux disease.  She requires analgesics for her chronic low back pain.  She has treated hypothyroidism  Current Medications (verified): 1)  Diovan Hct 160-25 Mg Tabs (Valsartan-Hydrochlorothiazide) .[name].. 1 Once Daily 2)  Lipitor 20 Mg Tabs (Atorvastatin Calcium) .[name].. 1 Once Daily 3)  Lyrica 50 Mg Caps (Pregabalin) .[name].. 1  Twice Daily 4)  Nexium 40 Mg Pack (Esomeprazole Magnesium) .[name].. 1 Once Daily 5)  Synthroid 25 Mcg Tabs (Levothyroxine Sodium) .[name].. 1 Once Daily 6)  Triazolam 0.25 Mg Tabs (Triazolam) .[name].. 1 or 2 At Bedtime As Needed 7)  Estropipate 1.5 Mg Tabs (Estropipate) .[name].. 1 Qam 8)  Diltiazem Hcl Er Beads 240 Mg Xr24h-Cap (Diltiazem Hcl Er Beads) .[name].. 1 Once Daily 9)  Clorazepate Dipotassium 7.5 Mg Tabs (Clorazepate Dipotassium) .... Two Times A Day As Needed 10)  Promethazine Hcl 25 Mg Tabs (Promethazine Hcl) .... One Every 6 Hours As Needed For Nausea 11)  Levaquin 500 Mg Tabs (Levofloxacin) .... One Daily  Allergies (verified): 1)  ! Cymbalta (Duloxetine Hcl) 2)  ! Vioxx 3)  ! Metronidazole (Metronidazole)  Past History:  Past Medical History: Reviewed history from 10/01/2009 and no changes required. Depression Hyperlipidemia Hypertension chronic pain syndrome paresthesias of the legs GERD Hypothyroidism Anxiety insomnia menopausal syndrome Diverticulosis, colon IBS Headache history of  acute urinary retention chronic constipation history of Crohn's colitis shingles 10/06 IGT   Past Surgical History: Reviewed history from 10/01/2009 and no changes required. colonoscopy 2008 Appendectomy Hysterectomy age 62 Tonsillectomy   abdominal pelvic CT December 2008  Review of Systems       The patient complains of difficulty walking.  The patient denies anorexia, fever, weight loss, weight gain, vision loss, decreased hearing, hoarseness, chest pain, syncope, dyspnea on exertion, peripheral edema, prolonged cough, headaches, hemoptysis, abdominal pain, melena, hematochezia, severe indigestion/heartburn, hematuria, incontinence, genital sores, muscle weakness, suspicious skin lesions, transient blindness, depression, unusual weight change, abnormal bleeding, enlarged lymph nodes, angioedema, and breast masses.    Physical Exam  General:  Well-developed,well-nourished,in no acute distress; alert,appropriate and cooperative throughout examination Head:  Normocephalic and atraumatic without obvious abnormalities. No apparent alopecia or balding. Eyes:  No corneal or conjunctival inflammation noted. EOMI. Perrla. Funduscopic exam benign, without hemorrhages, exudates or papilledema. Vision grossly normal. Mouth:  Oral mucosa and oropharynx without lesions or exudates.  Teeth in good repair. Neck:  No deformities, masses, or tenderness noted. Lungs:  Normal respiratory effort, chest expands symmetrically. Lungs are clear to auscultation, no crackles or wheezes. Heart:  Normal rate and regular rhythm. S1 and S2 normal without gallop, murmur, click, rub or other extra sounds. Abdomen:  Bowel sounds positive,abdomen soft and non-tender without masses, organomegaly or hernias noted. Msk:  No deformity or scoliosis noted of thoracic or lumbar spine.   Pulses:  R and L carotid,radial,femoral,dorsalis pedis and  posterior tibial pulses are full and equal bilaterally; the right dorsalis  pedis pulse diminished Extremities:  No clubbing, cyanosis, edema, or deformity noted with normal full range of motion of all joints.   Skin:  Intact without suspicious lesions or rashes Cervical Nodes:  No lymphadenopathy noted   Impression & Recommendations:  Problem # 1:  LOW BACK PAIN, CHRONIC (ICD-724.2)  Her updated medication list for this problem includes:    Hydrocodone-acetaminophen 5-500 Mg Tabs (Hydrocodone-acetaminophen) ..... One every 6  hours for pain  Problem # 2:  HYPOTHYROIDISM (ICD-244.9)  Her updated medication list for this problem includes:    Synthroid 25 Mcg Tabs (Levothyroxine sodium) .[name]... 1 once daily  Problem # 3:  HYPERTENSION (ICD-401.9)  Her updated medication list for this problem includes:    Diovan Hct 160-25 Mg Tabs (Valsartan-hydrochlorothiazide) .[name]... 1 once daily    Diltiazem Hcl Er Beads 240 Mg Xr24h-cap (Diltiazem hcl er beads) .[name]... 1 once daily  Problem # 4:  HYPERLIPIDEMIA (ICD-272.4)  Her updated medication list for this problem includes:    Lipitor 20 Mg Tabs (Atorvastatin calcium) .[name]... 1 once daily  Complete Medication List: 1)  Diovan Hct 160-25 Mg Tabs (Valsartan-hydrochlorothiazide) .[name].. 1 once daily 2)  Lipitor 20 Mg Tabs (Atorvastatin calcium) .[name].. 1 once daily 3)  Lyrica 50 Mg Caps (Pregabalin) .[name].. 1  twice daily 4)  Nexium 40 Mg Pack (Esomeprazole magnesium) .[name].. 1 once daily 5)  Synthroid 25 Mcg Tabs (Levothyroxine sodium) .[name].. 1 once daily 6)  Triazolam 0.25 Mg Tabs (Triazolam) .[name].. 1 or 2 at bedtime as needed 7)  Estropipate 1.5 Mg Tabs (Estropipate) .[name].. 1 qam 8)  Diltiazem Hcl Er Beads 240 Mg Xr24h-cap (Diltiazem hcl er beads) .[name].. 1 once daily 9)  Clorazepate Dipotassium 7.5 Mg Tabs (Clorazepate dipotassium) .... Two times a day as needed 10)  Promethazine Hcl 25 Mg Tabs (Promethazine hcl) .... One every 6 hours as needed for nausea 11)  Hydrocodone-acetaminophen 5-500 Mg Tabs (Hydrocodone-acetaminophen) .... One  every 6  hours for pain  Patient Instructions: 1)  Please schedule a follow-up appointment in 3 months. 2)  Advised not to eat any food or drink any liquids after 10 PM the night before your procedure. 3)  It is important that you exercise regularly at least 20 minutes 5 times a week. If you develop chest pain, have severe difficulty breathing, or feel very tired , stop exercising immediately and seek medical attention. Prescriptions: HYDROCODONE-ACETAMINOPHEN 5-500 MG TABS (HYDROCODONE-ACETAMINOPHEN) one every 6  hours for pain  #100 x 2   Entered and Authorized by:   [name]  MD   Signed by:   [name]  MD on 07/25/2010   Method used:   Print then Give to Patient   RxID:   [id number]

## 2011-01-09 NOTE — Progress Notes (Signed)
Summary: refill hydrocodone-acteaminophem and triazolam  Phone Note Refill Request Message from:  Fax from Pharmacy on Apr 11, 2010 8:07 AM  Refills Requested: Medication #1:  TRIAZOLAM 0.25 MG TABS 1 or 2 at bedtime as needed  Medication #2:  HYDROCODONE-ACETAMINOPHEN 5-500 MG TABS one every 6 hours as needed for pain [facility] ph [phone]    [place]   Method Requested: Telephone to Pharmacy Initial call taken by: Cay Schillings LPN,  May  5, 624THL 579FGE AM  Follow-up for Phone Call        OK  RF 2 Follow-up by: [name]  MD,  Apr 11, 2010 9:31 AM    Prescriptions: HYDROCODONE-ACETAMINOPHEN 5-500 MG TABS (HYDROCODONE-ACETAMINOPHEN) one every 6 hours as needed for pain  #100 x 2   Entered by:   Cay Schillings LPN   Authorized by:   [name]  MD   Signed by:   Cay Schillings LPN on [id number]   Method used:   Historical   [id number] TRIAZOLAM 0.25 MG TABS (TRIAZOLAM) 1 or 2 at bedtime as needed  #60 x 2   Entered by:   Cay Schillings LPN   Authorized by:   [name]  MD   Signed by:   Cay Schillings LPN on [id number]   Method used:   Historical   [id number]  called to [facility]. KIK

## 2011-01-09 NOTE — Miscellaneous (Signed)
Summary: Flu Shot/[facility]  Flu Shot/[facility]   Imported By: Laural Benes 08/26/2010 09:32:47  _____________________________________________________________________  External Attachment:    Type:   Image     Comment:   External Document

## 2011-01-09 NOTE — Miscellaneous (Signed)
  Clinical Lists Changes  Orders: Added new Referral order of Radiology Referral (Radiology) - Signed 

## 2011-01-09 NOTE — Progress Notes (Signed)
Summary: lab results  Phone Note Call from Patient Call back at Home Phone [phone]   Caller: Patient Call For: [name]  MD Summary of Call: Needs lab results, please. Initial call taken by: Deanna Artis CMA AAMA,  November 28, 2010 4:19 PM  Follow-up for Phone Call        all normal except very mild anemia Follow-up by: [name]  MD,  November 28, 2010 5:14 PM  Additional Follow-up for Phone Call Additional follow up Details #1::        spoke with pt - labs normal except mild anemia Additional Follow-up by: Cay Schillings LPN,  December 23, 624THL 9:19 AM

## 2011-01-09 NOTE — Assessment & Plan Note (Signed)
Summary: pt will come in fasting/njr pt rsc/njr/pt rescd from bump//ccm   Vital Signs:  Patient profile:   76 year old female Height:      58.5 inches Weight:      125 pounds BMI:     25.77 Temp:     97.5 degrees F oral BP sitting:   110 / 68  (left arm) Cuff size:   regular  Vitals Entered By: Cay Schillings LPN (December  9, 624THL 11:02 AM) CC: cpx--- rash to L neck Is Patient Diabetic? No   CC:  cpx--- rash to L neck.  History of Present Illness: 76 year old patient who is seen today for a wellness exam.  She has a history of depression.  Lower leg paresthesias, chronic low back pain.  She has been given a diagnosis in the past of fibromyalgia.  She is on Lyrica.  Allergies include Cymbalta.  She has hypertension and dyslipidemia.  Here for Medicare AWV:  1.   Risk factors based on Past M, S, F history:  Her vascular risk factors include hypertension, and dyslipidemia. 2.   Physical Activities: fairly sedentary limited somewhat by back and hip pain 3.   Depression/mood: on history depression 4.   Hearing: no deficits 5.   ADL's: independent in all aspects of daily living 6.   Fall Risk: moderate due to age and hip pain 7.   Home Safety: no problems identified 8.   Height, weight, &visual acuity:height and weight stable.  No change in visual acuity 9.   Counseling: heart healthy diet restricted salt diet.  All encouraged 10.   Labs ordered based on risk factors: return profile, including lipid panel will be reviewed.  Electrolytes will be monitored 11.           Referral Coordination- annual mammogram.  Encouraged will consider orthopedic referral 12.           Care Plan- heart healthy diet more regular exercise.  Encouraged 13.            Cognitive Assessment- alert and oriented, with normal affect.  No history of cognitive impairment or memory disturbance   Allergies: 1)  ! Cymbalta (Duloxetine Hcl) 2)  ! Vioxx 3)  ! Metronidazole (Metronidazole)  Past  History:  Past Medical History: Reviewed history from 10/01/2009 and no changes required. Depression Hyperlipidemia Hypertension chronic pain syndrome paresthesias of the legs GERD Hypothyroidism Anxiety insomnia menopausal syndrome Diverticulosis, colon IBS Headache history of acute urinary retention chronic constipation history of Crohn's colitis shingles 10/06 IGT   Past Surgical History: Reviewed history from 10/01/2009 and no changes required. colonoscopy 2008 Appendectomy Hysterectomy age 29 Tonsillectomy   abdominal pelvic CT December 2008  Family History: Reviewed history from 07/23/2009 and no changes required. father died age 38-history of R. A. TB, died of cancer, unclear type mother died mid 11s cancer, unclear type  Four sisters no brothers-positive for breast cancer, hypertension, dyslipidemia, end-stage COPD  Social History: Reviewed history from 07/23/2009 and no changes required. 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  The patient denies anorexia, fever, weight loss, weight gain, vision loss, decreased hearing, hoarseness, chest pain, syncope, dyspnea on exertion, prolonged cough, headaches, hemoptysis, abdominal pain, melena, hematochezia, severe indigestion/heartburn, hematuria, incontinence, genital sores, muscle weakness, suspicious skin lesions, transient blindness, depression, unusual weight change, abnormal bleeding, enlarged lymph nodes, angioedema, breast masses, and testicular masses.    Physical Exam  General:  Well-developed,well-nourished,in no acute distress; alert,appropriate and cooperative throughout examination Head:  Normocephalic and atraumatic without obvious abnormalities. No apparent alopecia or balding. Eyes:  No corneal or conjunctival inflammation noted. EOMI. Perrla. Funduscopic exam benign, without hemorrhages, exudates or  papilledema. Vision grossly normal. Ears:  External ear exam shows no significant lesions or deformities.  Otoscopic examination reveals clear canals, tympanic membranes are intact bilaterally without bulging, retraction, inflammation or discharge. Hearing is grossly normal bilaterally. Nose:  External nasal examination shows no deformity or inflammation. Nasal mucosa are pink and moist without lesions or exudates. Mouth:  Oral mucosa and oropharynx without lesions or exudates.  upper dentures in place Neck:  No deformities, masses, or tenderness noted. Chest Wall:  No deformities, masses, or tenderness noted. Breasts:  No mass, nodules, thickening, tenderness, bulging, retraction, inflamation, nipple discharge or skin changes noted.   Lungs:  Normal respiratory effort, chest expands symmetrically. Lungs are clear to auscultation, no crackles or wheezes. Heart:  Normal rate and regular rhythm. S1 and S2 normal without gallop, murmur, click, rub or other extra sounds. Abdomen:  Bowel sounds positive,abdomen soft and non-tender without masses, organomegaly or hernias noted. Rectal:  No external abnormalities noted. Normal sphincter tone. No rectal masses or tenderness. Genitalia:  status post hysterectomy Msk:  No deformity or scoliosis noted of thoracic or lumbar spine.   Pulses:  R and L carotid,radial,femoral,dorsalis pedis and posterior tibial pulses are full and equal bilaterally Extremities:  1+ left pedal edema and 1+ right pedal edema.  1+ left pedal edema and 1+ right pedal edema.   Neurologic:  No cranial nerve deficits noted. Station and gait are normal. Plantar reflexes are down-going bilaterally. DTRs are symmetrical throughout. Sensory, motor and coordinative functions appear intact. Skin:  Intact without suspicious lesions or rashes Cervical Nodes:  No lymphadenopathy noted Axillary Nodes:  No palpable lymphadenopathy Inguinal Nodes:  No significant adenopathy Psych:  Cognition and  judgment appear intact. Alert and cooperative with normal attention span and concentration. No apparent delusions, illusions, hallucinations   Impression & Recommendations:  Problem # 1:  Preventive Health Care (ICD-V70.0)  Orders: Medicare -1st Annual Wellness Visit [phone])  Problem # 2:  HIP PAIN, LEFT (ICD-719.45)  Her updated medication list for this problem includes:    Hydrocodone-acetaminophen 5-500 Mg Tabs (Hydrocodone-acetaminophen) ..... One every 6  hours for pain  Her updated medication list for this problem includes:    Hydrocodone-acetaminophen 5-500 Mg Tabs (Hydrocodone-acetaminophen) ..... One every 6  hours for pain  Problem # 3:  LOW BACK PAIN, CHRONIC (ICD-724.2)  Her updated medication list for this problem includes:    Hydrocodone-acetaminophen 5-500 Mg Tabs (Hydrocodone-acetaminophen) ..... One every 6  hours for pain    Her updated medication list for this problem includes:    Hydrocodone-acetaminophen 5-500 Mg Tabs (Hydrocodone-acetaminophen) ..... One every 6  hours for pain  Orders: Venipuncture [id number]) UA Dipstick w/o Micro (automated)  (81003) TLB-BMP (Basic Metabolic Panel-BMET) (99991111) TLB-Hepatic/Liver Function Pnl (80076-HEPATIC) TLB-CBC Platelet - w/Differential (85025-CBCD)  Problem # 4:  HYPERTENSION (ICD-401.9)  Her updated medication list for this problem includes:    Diovan Hct 160-25 Mg Tabs (Valsartan-hydrochlorothiazide) .[name]... 1 once daily    Diltiazem Hcl Er Beads 240 Mg Xr24h-cap (Diltiazem hcl er beads) .[name]... 1 once daily  Orders: EKG w/ Interpretation (93000) Venipuncture [id number]) TLB-BMP (Basic Metabolic Panel-BMET) (99991111) TLB-Hepatic/Liver Function Pnl (80076-HEPATIC) TLB-CBC Platelet - w/Differential (85025-CBCD)  Her updated medication list for this problem includes:    Diovan Hct 160-25  Mg Tabs (Valsartan-hydrochlorothiazide) .[name]... 1 once daily    Diltiazem Hcl Er Beads 240 Mg Xr24h-cap (Diltiazem hcl  er beads) .[name]... 1 once daily  Problem # 5:  HYPERLIPIDEMIA (ICD-272.4)  Her updated medication list for this problem includes:    Lipitor 20 Mg Tabs (Atorvastatin calcium) .[name]... 1 once daily    Her updated medication list for this problem includes:    Lipitor 20 Mg Tabs (Atorvastatin calcium) .[name]... 1 once daily  Orders: Venipuncture [id number]) TLB-Lipid Panel (80061-LIPID) TLB-BMP (Basic Metabolic Panel-BMET) (99991111) TLB-Hepatic/Liver Function Pnl (80076-HEPATIC) TLB-CBC Platelet - w/Differential (85025-CBCD)  Complete Medication List: 1)  Diovan Hct 160-25 Mg Tabs (Valsartan-hydrochlorothiazide) .[name].. 1 once daily 2)  Lipitor 20 Mg Tabs (Atorvastatin calcium) .[name].. 1 once daily 3)  Lyrica 50 Mg Caps (Pregabalin) .[name].. 1  twice daily 4)  Nexium 40 Mg Pack (Esomeprazole magnesium) .[name].. 1 once daily 5)  Synthroid 25 Mcg Tabs (Levothyroxine sodium) .[name].. 1 once daily 6)  Triazolam 0.25 Mg Tabs (Triazolam) .[name].. 1 or 2 at bedtime as needed 7)  Estropipate 1.5 Mg Tabs (Estropipate) .[name].. 1 qam 8)  Diltiazem Hcl Er Beads 240 Mg Xr24h-cap (Diltiazem hcl er beads) .[name].. 1 once daily 9)  Clorazepate Dipotassium 7.5 Mg Tabs (Clorazepate dipotassium) .... Two times a day as needed 10)  Promethazine Hcl 25 Mg Tabs (Promethazine hcl) .... One every 6 hours as needed for nausea 11)  Hydrocodone-acetaminophen 5-500 Mg Tabs (Hydrocodone-acetaminophen) .... One every 6  hours for pain  Other Orders: Zoster (Shingles) Vaccine Live [id number]) Admin 1st Vaccine [phone]) TLB-TSH (Thyroid Stimulating Hormone) (84443-TSH)  Patient Instructions: 1)  Please schedule a follow-up appointment in 4 months. 2)  Limit your Sodium (Salt). 3)  It is important that you exercise regularly at least 20 minutes 5 times a week. If you develop chest pain, have severe difficulty breathing, or feel very tired , stop exercising immediately and seek medical attention. 4)  Schedule your mammogram. 5)  Take calcium +Vitamin D  daily. Prescriptions: HYDROCODONE-ACETAMINOPHEN 5-500 MG TABS (HYDROCODONE-ACETAMINOPHEN) one every 6  hours for pain  #100 x 2   Entered and Authorized by:   [name]  MD   Signed by:   [name]  MD on 11/15/2010   Method used:   Print then Give to Patient   RxID:   [id number] CLORAZEPATE DIPOTASSIUM 7.5 MG TABS (CLORAZEPATE DIPOTASSIUM) two times a day as needed  #90 x 3   Entered and Authorized by:   [name]  MD   Signed by:   [name]  MD on 11/15/2010   Method used:   Print then Give to Patient   RxID:   [id number] DILTIAZEM HCL ER BEADS 240 MG XR24H-CAP (DILTIAZEM HCL ER BEADS) 1 once daily  #90 x 4   Entered and Authorized by:   [name]  MD   Signed by:   [name]  MD on 11/15/2010   Method used:   Print then Give to Patient   RxID:   [id number] SYNTHROID 25 MCG TABS (LEVOTHYROXINE SODIUM) 1 once daily  #90 x 4   Entered and Authorized by:   [name]  MD   Signed by:   [name]  MD on 11/15/2010   Method used:   Print then Give to Patient   RxID:   [id number] NEXIUM 40 MG PACK (ESOMEPRAZOLE MAGNESIUM) 1 once daily  #90 x 4   Entered and Authorized by:  [name]  MD   Signed by:   [name]  MD on 11/15/2010   Method used:   Print then Give to Patient   RxID:   [id number] LYRICA 50 MG CAPS (PREGABALIN) 1  twice daily  #180 x 6   Entered and Authorized by:   [name]  MD   Signed by:   [name]  MD on 11/15/2010   Method used:   Print then Give to Patient   RxID:   [id number] LIPITOR 20 MG TABS (ATORVASTATIN CALCIUM) 1 once daily  #90 x 4   Entered and Authorized by:   [name]  MD   Signed by:   [name]  MD on 11/15/2010   Method used:   Print then Give to Patient   RxID:   [id number] DIOVAN HCT 160-25 MG TABS (VALSARTAN-HYDROCHLOROTHIAZIDE) 1 once daily Brand medically  necessary #90 Tablet x 6   Entered and Authorized by:   [name]  MD   Signed by:   [name]  MD on 11/15/2010   Method used:   Print then Give to Patient   RxID:   [id number]    Orders Added: 1)  EKG w/ Interpretation [93000] 2)  Medicare -1st Annual Wellness Visit [id number] 3)  Est. Patient Level III [id number] 4)  Venipuncture [id number] 5)  UA Dipstick w/o Micro (automated)  [81003] 6)  Zoster (Shingles) Vaccine Live [90736] 7)  Admin 1st Vaccine [90471] 8)  TLB-Lipid Panel [80061-LIPID] 9)  TLB-BMP (Basic Metabolic Panel-BMET) [id number] 10)  TLB-Hepatic/Liver Function Pnl [80076-HEPATIC] 11)  TLB-TSH (Thyroid Stimulating Hormone) [84443-TSH] 12)  TLB-CBC Platelet - w/Differential [85025-CBCD]   Immunizations Administered:  Zostavax # 1:    Vaccine Type: Zostavax    Site: left deltoid    Mfr: Merck    Dose: 0.5 ml    Route: IM    Given by: Cay Schillings LPN    Exp. Date: 10/09/2011    Lot #: [id number]    VIS given: 09/19/05 given November 15, 2010.    Physician counseled: yes   Immunizations Administered:  Zostavax # 1:    Vaccine Type: Zostavax    Site: left deltoid    Mfr: Merck    Dose: 0.5 ml    Route: IM    Given by: Cay Schillings LPN    Exp. Date: 10/09/2011    Lot #: [id number]    VIS given: 09/19/05 given November 15, 2010.    Physician counseled: yes   Laboratory Results   Urine Tests    Routine Urinalysis   Color: yellow Appearance: Clear Glucose: negative   (Normal Range: Negative) Bilirubin: negative   (Normal Range: Negative) Ketone: negative   (Normal Range: Negative) Spec. Gravity: 1.010   (Normal Range: 1.003-1.035) Blood: trace-lysed   (Normal Range: Negative) pH: 6.5   (Normal Range: 5.0-8.0) Protein: negative   (Normal Range: Negative) Urobilinogen: 0.2   (Normal Range: 0-1) Nitrite: negative   (Normal Range: Negative) Leukocyte Esterace: 2+   (Normal Range: Negative)    Comments: Joyce Gross  November 15, 2010 5:00 PM

## 2011-01-09 NOTE — Progress Notes (Signed)
Summary: RESULTS OF MRI  Phone Note Call from Patient   Caller: Patient [phone] Reason for Call: Talk to Nurse, Talk to Doctor Summary of Call: Pt called to adv that she would like to have results from MRI called to her once received / read by Dr Raliegh Ip.... Pt adv that she can come in for an OV if necessary to go over same.  Pt can be reached at [phone] with any questions or concerns...Marland KitchenMarland KitchenMarland Kitchen If pt needs an OV to go over MRI results, direct back to me and I will schedule same.... If not, pt will be scheduled for 3 mth f/u.   Initial call taken by: Duanne Moron,  January 17, 2010 10:29 AM    called and discussed

## 2011-01-09 NOTE — Progress Notes (Signed)
Summary: hip xrays, change pain meds?  Phone Note Call from Patient   Summary of Call: Pt is asking for a stronger pain med, and wants to schedule hip xrays.  States Dr. Raliegh Ip wanted her to do this at her last visit, but could not go at that time.  [place] Renie Ora) Initial call taken by: Deanna Artis CMA,  May 16, 2010 9:33 AM  Follow-up for Phone Call        x ray l hip  Follow-up by: [name]  MD,  May 16, 2010 12:53 PM  Additional Follow-up for Phone Call Additional follow up Details #1::        Pt is requesting stronger pain meds. Additional Follow-up by: Deanna Artis CMA,  May 16, 2010 1:18 PM  New Problems: HIP PAIN, LEFT (ICD-719.45)   Additional Follow-up for Phone Call Additional follow up Details #2::    pt cb pt is aware waiting on doc advice Follow-up by: Glo Herring,  May 16, 2010 4:55 PM  Additional Follow-up for Phone Call Additional follow up Details #3:: Details for Additional Follow-up Action Taken: patient has tramadol and vidcodin to take for pain-  may take whichever works the best with alleve 2 twice daily Additional Follow-up by: [name]  MD,  May 16, 2010 4:58 PM  New Problems: HIP PAIN, LEFT (ICD-719.45)     Pt will try the Vicodin 1/2 with the Aleve and see if she gets better pain relief.

## 2011-01-09 NOTE — Progress Notes (Signed)
----   Converted from flag ---- ---- 01/10/2010 1:55 PM, [name]  MD wrote: alprazolam 0.5  #6 one 30 minutes prior to procedure  ---- 01/10/2010 1:45 PM, Daine Gravel [facility], RN wrote: wants med to help her relax for MRI ------------------------------

## 2011-01-24 ENCOUNTER — Encounter: Payer: Self-pay | Admitting: Internal Medicine

## 2011-01-24 ENCOUNTER — Ambulatory Visit (INDEPENDENT_AMBULATORY_CARE_PROVIDER_SITE_OTHER): Payer: Medicare Other | Admitting: Internal Medicine

## 2011-01-24 DIAGNOSIS — F329 Major depressive disorder, single episode, unspecified: Secondary | ICD-10-CM

## 2011-01-24 DIAGNOSIS — R5381 Other malaise: Secondary | ICD-10-CM

## 2011-01-24 DIAGNOSIS — F411 Generalized anxiety disorder: Secondary | ICD-10-CM

## 2011-01-24 DIAGNOSIS — R531 Weakness: Secondary | ICD-10-CM

## 2011-01-24 DIAGNOSIS — M545 Low back pain, unspecified: Secondary | ICD-10-CM

## 2011-01-24 DIAGNOSIS — E039 Hypothyroidism, unspecified: Secondary | ICD-10-CM

## 2011-01-24 DIAGNOSIS — E785 Hyperlipidemia, unspecified: Secondary | ICD-10-CM

## 2011-01-24 DIAGNOSIS — R5383 Other fatigue: Secondary | ICD-10-CM

## 2011-01-24 LAB — HEPATIC FUNCTION PANEL
ALT: 10 U/L (ref 0–35)
AST: 15 U/L (ref 0–37)
Albumin: 3.9 g/dL (ref 3.5–5.2)
Alkaline Phosphatase: 60 U/L (ref 39–117)
Bilirubin, Direct: 0.1 mg/dL (ref 0.0–0.3)
Total Bilirubin: 0.4 mg/dL (ref 0.3–1.2)
Total Protein: 7.2 g/dL (ref 6.0–8.3)

## 2011-01-24 LAB — BASIC METABOLIC PANEL
BUN: 19 mg/dL (ref 6–23)
CO2: 29 mEq/L (ref 19–32)
Calcium: 9.8 mg/dL (ref 8.4–10.5)
Chloride: 102 mEq/L (ref 96–112)
Creatinine, Ser: 1 mg/dL (ref 0.4–1.2)
GFR: 57.67 mL/min — ABNORMAL LOW (ref >60.00)
Glucose, Bld: 101 mg/dL — ABNORMAL HIGH (ref 70–99)
Potassium: 4.4 mEq/L (ref 3.5–5.1)
Sodium: 142 mEq/L (ref 135–145)

## 2011-01-24 LAB — TSH: TSH: 1.14 u[IU]/mL (ref 0.35–5.50)

## 2011-01-24 LAB — SEDIMENTATION RATE: Sed Rate: 53 mm/hr — ABNORMAL HIGH (ref 0–22)

## 2011-01-24 NOTE — Progress Notes (Signed)
Addended by: Denna Haggard on: 01/24/2011 04:26 PM   Modules accepted: Orders

## 2011-01-24 NOTE — Patient Instructions (Signed)
Limit your sodium (Salt) intake    It is important that you exercise regularly, at least 20 minutes 3 to 4 times per week.  If you develop chest pain or shortness of breath seek  medical attention.  Avoids foods high in acid such as tomatoes citrus juices, and spicy foods.  Avoid eating within two hours of lying down or before exercising.  Do not overheat.  Try smaller more frequent meals.  If symptoms persist, elevate the head of her bed 12 inches while sleeping.  Return office visit two weeks

## 2011-01-24 NOTE — Progress Notes (Signed)
  Subjective:    Patient ID: Heather [name], female    DOB: 04-20-1935, 76 y.o.   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  She has multinodal problems include a history of anxiety, depression, chronic pain syndrome, insomnia, history of IBS, and Crohn's colitis.  She has been seen by Dr. [name] in the past.  She has been under considerable situational stress due to the poor health of a close friend.  Complaints include swelling, difficulty in the morning, but apparently not an issue later in the day.  She complains of some weight gain, fatigue, poor sleep, and worsening pain.  She is requesting a stronger pain medication.  She is accompanied by her sister, who feels that at times.  She is over sedated. She has treated hypertension, dyslipidemia, and also hypothyroidism.  Is on low-dose thyroid supplementation    Review of Systems  Constitutional: Positive for fatigue.  HENT: Negative for hearing loss, congestion, sore throat, rhinorrhea, dental problem, sinus pressure and tinnitus.   Eyes: Negative for pain, discharge and visual disturbance.  Respiratory: Negative for cough and shortness of breath.   Cardiovascular: Negative for chest pain, palpitations and leg swelling.  Gastrointestinal: Negative for nausea, vomiting, abdominal pain, diarrhea, constipation, blood in stool and abdominal distention.  Genitourinary: Negative for dysuria, urgency, frequency, hematuria, flank pain, vaginal bleeding, vaginal discharge, difficulty urinating, vaginal pain and pelvic pain.  Musculoskeletal: Positive for back pain. Negative for joint swelling, arthralgias and gait problem.  Skin: Negative for rash.  Neurological: Positive for weakness, numbness and headaches. Negative for dizziness, syncope and speech difficulty.  Hematological: Negative for adenopathy.  Psychiatric/Behavioral: Positive for dysphoric mood and decreased concentration. Negative for behavioral  problems and agitation. The patient is not nervous/anxious.        Objective:   Physical Exam  Constitutional: She is oriented to person, place, and time. She appears well-developed and well-nourished.       Overweight Appears slightly depressed  HENT:  Head: Normocephalic.  Right Ear: External ear normal.  Left Ear: External ear normal.  Mouth/Throat: Oropharynx is clear and moist.  Eyes: Conjunctivae and EOM are normal. Pupils are equal, round, and reactive to light.  Neck: Normal range of motion. Neck supple. No thyromegaly present.  Cardiovascular: Normal rate, regular rhythm, normal heart sounds and intact distal pulses.   Pulmonary/Chest: Effort normal and breath sounds normal.  Abdominal: Soft. Bowel sounds are normal. She exhibits no mass. There is no tenderness.  Musculoskeletal: Normal range of motion.  Lymphadenopathy:    She has no cervical adenopathy.  Neurological: She is alert and oriented to person, place, and time.  Skin: Skin is warm and dry. No rash noted.  Psychiatric: Her behavior is normal.       Appears slightly depressed, and somnolent          Assessment & Plan:  Depression Chronic pain Insomnia Hypertension Dyslipidemia Hypothyroidism  Laboratory update  will be reviewed, including a TSH.  Psychological referral discussed.  Will continue her present medical regimen.

## 2011-02-06 ENCOUNTER — Encounter: Payer: Self-pay | Admitting: Internal Medicine

## 2011-02-07 ENCOUNTER — Ambulatory Visit (INDEPENDENT_AMBULATORY_CARE_PROVIDER_SITE_OTHER): Payer: Medicare Other | Admitting: Internal Medicine

## 2011-02-07 ENCOUNTER — Encounter: Payer: Self-pay | Admitting: Internal Medicine

## 2011-02-07 DIAGNOSIS — E039 Hypothyroidism, unspecified: Secondary | ICD-10-CM

## 2011-02-07 DIAGNOSIS — R209 Unspecified disturbances of skin sensation: Secondary | ICD-10-CM

## 2011-02-07 DIAGNOSIS — F411 Generalized anxiety disorder: Secondary | ICD-10-CM

## 2011-02-07 DIAGNOSIS — M545 Low back pain, unspecified: Secondary | ICD-10-CM

## 2011-02-07 DIAGNOSIS — F329 Major depressive disorder, single episode, unspecified: Secondary | ICD-10-CM

## 2011-02-07 DIAGNOSIS — I10 Essential (primary) hypertension: Secondary | ICD-10-CM

## 2011-02-07 MED ORDER — OXYCODONE-ACETAMINOPHEN 5-325 MG PO TABS
1.0000 | ORAL_TABLET | Freq: Four times a day (QID) | ORAL | Status: DC | PRN
Start: 2011-02-07 — End: 2011-09-22

## 2011-02-07 MED ORDER — FUROSEMIDE 20 MG PO TABS: ORAL_TABLET | ORAL | Status: DC

## 2011-02-07 NOTE — Patient Instructions (Signed)
Pain clinic referral as discussed Limit your sodium (Salt) intake  Return in 3 months for follow-up  Please check your blood pressure on a regular basis.  If it is consistently greater than 150/90, please make an office appointment.

## 2011-02-07 NOTE — Progress Notes (Signed)
Subjective:    Patient ID: Heather [name], female    DOB: 1935-06-27, 76 y.o.   MRN: [id number]  HPI   A 76 year old patient who is seen today for her two-week followup. She continues to complain of pain in the back and legs. She states is received A. Shingles vaccine several weeks ago and had resolution of her burning dysesthesias of the legs for several days. She complains of increasing leg pain burning and pedal edema. She states that she " hurts all over". She states that the hydrocodone is no longer effective  And she is requesting Percocet. She was on this medicine from her previous PCP. Sulfasalazine with this practice he has had radiographs of the hips do 2 left hip pain that were nonrevealing. Due to the chronic low back pain and paresthesias a lumbar MRI was also obtained.    Vitals Entered By: Chipper Oman, RN (January 02, 2010 8:43 AM)  O2 Flow:  Room air CC: OV, fasting. C/o SOB, congestion.   CC:  OV, fasting. C/o SOB, and congestion.[name]  History of Present Illness:  76 year old patient who is seen today for a comprehensive evaluation.  he has a history of advanced COPD, which has been fairly stable.  For the past few days, he has noticed an increasing cough and congestion is slightly worsened dyspnea on exertion.  He has treated hypertension, history of BPH and also a history of paroxysmal atrial fibrillation. He is on maintenance Advair, as well as home nebulizer treatments with Xopenex.  No new concerns or complaints.  In general, his pulmonary status has been stable  Allergies:  1)  ! Penicillin V Potassium (Penicillin V Potassium)  Past History:  Past Medical History: Reviewed history from 07/11/2008 and no changes required. Asthma Hypertension COPD Atrial fibrillation Benign prostatic hypertrophy  Past Surgical History: Reviewed history from 09/15/2008 and no changes required. Tonsillectomy sigmoidoscopy 2002 colonoscopy 2007  Family History: Reviewed  history from 08/18/2007 and no changes required. father died age 23 mother died age 22 apparently, an autoimmune  disorder one sister, health unknown  Social History: Reviewed history from 08/18/2007 and no changes required. wife deceased from colon cancer   Past Medical History  Diagnosis Date  . ANXIETY 07/23/2009  . DEPRESSION 07/23/2009  . DIVERTICULOSIS, COLON 07/23/2009  . GERD 07/23/2009  . Headache 07/23/2009  . HIP PAIN, LEFT 05/16/2010  . HYPERLIPIDEMIA 07/23/2009  . HYPERTENSION 07/23/2009  . HYPOTHYROIDISM 07/23/2009  . LOW BACK PAIN, CHRONIC 10/01/2009  . PARESTHESIA 10/01/2009  . IBS (irritable bowel syndrome)   . History of Crohn's disease   . Chronic pain   . Hypothyroidism   . Insomnia    Past Surgical History  Procedure Date  . Appendectomy   . Abdominal hysterectomy   . Tonsillectomy     reports that she has never smoked. She does not have any smokeless tobacco history on file. Her alcohol and drug histories not on file. family history is not on file.        Review of Systems  Constitutional: Negative.   HENT: Negative for hearing loss, congestion, sore throat, rhinorrhea, dental problem, sinus pressure and tinnitus.   Eyes: Negative for pain, discharge and visual disturbance.  Respiratory: Negative for cough and shortness of breath.   Cardiovascular: Negative for chest pain, palpitations and leg swelling.  Gastrointestinal: Negative for nausea, vomiting, abdominal pain, diarrhea, constipation, blood in stool and abdominal distention.  Genitourinary: Negative for dysuria, urgency, frequency, hematuria, flank pain, vaginal bleeding,  vaginal discharge, difficulty urinating, vaginal pain and pelvic pain.  Musculoskeletal: Positive for back pain. Negative for joint swelling, arthralgias and gait problem.  Skin: Negative for rash.  Neurological: Negative for dizziness, syncope, speech difficulty, weakness, numbness and headaches.       [ Complains of burning  dysesthesias of her legs which has been a chronic problem Hematological: Negative for adenopathy.  Psychiatric/Behavioral: Negative for behavioral problems, dysphoric mood and agitation. The patient is not nervous/anxious.        Objective:   Physical Exam  Constitutional: She is oriented to person, place, and time. She appears well-developed and well-nourished.  HENT:  Head: Normocephalic.  Right Ear: External ear normal.  Left Ear: External ear normal.  Mouth/Throat: Oropharynx is clear and moist.  Eyes: Conjunctivae and EOM are normal. Pupils are equal, round, and reactive to light.  Neck: Normal range of motion. Neck supple. No thyromegaly present.  Cardiovascular: Normal rate, regular rhythm, normal heart sounds and intact distal pulses.   Pulmonary/Chest: Effort normal and breath sounds normal.  Abdominal: Soft. Bowel sounds are normal. She exhibits no mass. There is no tenderness.  Musculoskeletal: Normal range of motion. She exhibits edema.        Trace pedal edema  Lymphadenopathy:    She has no cervical adenopathy.  Neurological: She is alert and oriented to person, place, and time.  Skin: Skin is warm and dry. No rash noted.  Psychiatric: She has a normal mood and affect. Her behavior is normal.          Assessment & Plan:   chronic low back pain and lower extremity dysesthesia. She is requesting a stronger pain medication. She was told that I did not prescribe long-term oxycodone that was going to give her a short supply until evaluation by pain management;  she is agreeable   hypertension well controlled  2 edema we'll treat with when necessary furosemide 20 mg  mildly elevated ESR  impaired glucose tolerance- possible diabetic neuropathy

## 2011-02-18 ENCOUNTER — Inpatient Hospital Stay (HOSPITAL_COMMUNITY)
Admission: EM | Admit: 2011-02-18 | Discharge: 2011-02-22 | DRG: 689 | Disposition: A | Discharge status: Home / Self Care | Payer: Medicare Other | Attending: Internal Medicine | Admitting: Internal Medicine

## 2011-02-18 ENCOUNTER — Other Ambulatory Visit (HOSPITAL_COMMUNITY): Payer: Medicare Other

## 2011-02-18 ENCOUNTER — Emergency Department (HOSPITAL_COMMUNITY): Payer: Medicare Other

## 2011-02-18 DIAGNOSIS — I10 Essential (primary) hypertension: Secondary | ICD-10-CM | POA: Diagnosis present

## 2011-02-18 DIAGNOSIS — M1990 Unspecified osteoarthritis, unspecified site: Secondary | ICD-10-CM | POA: Diagnosis present

## 2011-02-18 DIAGNOSIS — N390 Urinary tract infection, site not specified: Principal | ICD-10-CM | POA: Diagnosis present

## 2011-02-18 DIAGNOSIS — E039 Hypothyroidism, unspecified: Secondary | ICD-10-CM | POA: Diagnosis present

## 2011-02-18 DIAGNOSIS — E860 Dehydration: Secondary | ICD-10-CM | POA: Diagnosis present

## 2011-02-18 DIAGNOSIS — R109 Unspecified abdominal pain: Secondary | ICD-10-CM | POA: Diagnosis present

## 2011-02-18 DIAGNOSIS — K219 Gastro-esophageal reflux disease without esophagitis: Secondary | ICD-10-CM | POA: Diagnosis present

## 2011-02-18 DIAGNOSIS — E785 Hyperlipidemia, unspecified: Secondary | ICD-10-CM | POA: Diagnosis present

## 2011-02-18 DIAGNOSIS — K5900 Constipation, unspecified: Secondary | ICD-10-CM | POA: Diagnosis present

## 2011-02-18 DIAGNOSIS — G9341 Metabolic encephalopathy: Secondary | ICD-10-CM | POA: Diagnosis present

## 2011-02-18 LAB — DIFFERENTIAL
Basophils Absolute: 0 10*3/uL (ref 0.0–0.1)
Basophils Relative: 0 % (ref 0–1)
Eosinophils Absolute: 0.2 10*3/uL (ref 0.0–0.7)
Eosinophils Relative: 2 % (ref 0–5)
Lymphocytes Relative: 23 % (ref 12–46)
Lymphs Abs: 2.5 10*3/uL (ref 0.7–4.0)
Monocytes Absolute: 0.8 10*3/uL (ref 0.1–1.0)
Monocytes Relative: 7 % (ref 3–12)
Neutro Abs: 7.3 10*3/uL (ref 1.7–7.7)
Neutrophils Relative %: 67 % (ref 43–77)

## 2011-02-18 LAB — CARDIAC PANEL(CRET KIN+CKTOT+MB+TROPI)
CK, MB: 0.9 ng/mL (ref 0.3–4.0)
Relative Index: INVALID (ref 0.0–2.5)
Total CK: 54 U/L (ref 7–177)
Troponin I: 0.01 ng/mL (ref 0.00–0.06)

## 2011-02-18 LAB — CBC
HCT: 37.3 % (ref 36.0–46.0)
Hemoglobin: 11.9 g/dL — ABNORMAL LOW (ref 12.0–15.0)
MCH: 30.7 pg (ref 26.0–34.0)
MCHC: 31.9 g/dL (ref 30.0–36.0)
MCV: 96.4 fL (ref 78.0–100.0)
Platelets: 370 10*3/uL (ref 150–400)
RBC: 3.87 MIL/uL (ref 3.87–5.11)
RDW: 13.6 % (ref 11.5–15.5)
WBC: 10.9 10*3/uL — ABNORMAL HIGH (ref 4.0–10.5)

## 2011-02-18 LAB — COMPREHENSIVE METABOLIC PANEL
ALT: 11 U/L (ref 0–35)
AST: 20 U/L (ref 0–37)
Albumin: 3.9 g/dL (ref 3.5–5.2)
Alkaline Phosphatase: 73 U/L (ref 39–117)
BUN: 16 mg/dL (ref 6–23)
CO2: 28 mEq/L (ref 19–32)
Calcium: 9.3 mg/dL (ref 8.4–10.5)
Chloride: 102 mEq/L (ref 96–112)
Creatinine, Ser: 1.23 mg/dL — ABNORMAL HIGH (ref 0.4–1.2)
GFR calc Af Amer: 52 mL/min — ABNORMAL LOW (ref >60)
GFR calc non Af Amer: 43 mL/min — ABNORMAL LOW (ref >60)
Glucose, Bld: 117 mg/dL — ABNORMAL HIGH (ref 70–99)
Potassium: 3.7 mEq/L (ref 3.5–5.1)
Sodium: 138 mEq/L (ref 135–145)
Total Bilirubin: 0.6 mg/dL (ref 0.3–1.2)
Total Protein: 7.4 g/dL (ref 6.0–8.3)

## 2011-02-18 LAB — URINE MICROSCOPIC-ADD ON

## 2011-02-18 LAB — POCT CARDIAC MARKERS
CKMB, poc: <1 ng/mL — ABNORMAL LOW (ref 1.0–8.0)
Myoglobin, poc: 91.5 ng/mL (ref 12–200)
Troponin i, poc: <0.05 ng/mL (ref 0.00–0.09)

## 2011-02-18 LAB — URINALYSIS, ROUTINE W REFLEX MICROSCOPIC
Bilirubin Urine: NEGATIVE
Glucose, UA: NEGATIVE mg/dL
Hgb urine dipstick: NEGATIVE
Ketones, ur: NEGATIVE mg/dL
Nitrite: NEGATIVE
Protein, ur: NEGATIVE mg/dL
Specific Gravity, Urine: 1.01 (ref 1.005–1.030)
Urobilinogen, UA: 0.2 mg/dL (ref 0.0–1.0)
pH: 7.5 (ref 5.0–8.0)

## 2011-02-18 LAB — TSH: TSH: 0.298 u[IU]/mL — ABNORMAL LOW (ref 0.350–4.500)

## 2011-02-18 LAB — T4, FREE: Free T4: 1.31 ng/dL (ref 0.80–1.80)

## 2011-02-18 LAB — T3 UPTAKE: T3 Uptake Ratio: 32.9 % (ref 22.5–37.0)

## 2011-02-19 ENCOUNTER — Emergency Department (HOSPITAL_COMMUNITY): Payer: Medicare Other

## 2011-02-19 ENCOUNTER — Inpatient Hospital Stay (HOSPITAL_COMMUNITY)
Admit: 2011-02-19 | Discharge: 2011-02-19 | Disposition: A | Discharge status: Home / Self Care | Payer: Medicare Other | Attending: Internal Medicine | Admitting: Internal Medicine

## 2011-02-19 DIAGNOSIS — I369 Nonrheumatic tricuspid valve disorder, unspecified: Secondary | ICD-10-CM

## 2011-02-19 LAB — URINALYSIS, ROUTINE W REFLEX MICROSCOPIC
Bilirubin Urine: NEGATIVE
Glucose, UA: NEGATIVE mg/dL
Hgb urine dipstick: NEGATIVE
Ketones, ur: NEGATIVE mg/dL
Nitrite: NEGATIVE
Protein, ur: NEGATIVE mg/dL
Specific Gravity, Urine: 1.013 (ref 1.005–1.030)
Urobilinogen, UA: 0.2 mg/dL (ref 0.0–1.0)
pH: 7.5 (ref 5.0–8.0)

## 2011-02-19 LAB — CARDIAC PANEL(CRET KIN+CKTOT+MB+TROPI)
CK, MB: 1.1 ng/mL (ref 0.3–4.0)
CK, MB: 1.2 ng/mL (ref 0.3–4.0)
Relative Index: INVALID (ref 0.0–2.5)
Relative Index: INVALID (ref 0.0–2.5)
Total CK: 50 U/L (ref 7–177)
Total CK: 50 U/L (ref 7–177)
Troponin I: 0.01 ng/mL (ref 0.00–0.06)
Troponin I: 0.01 ng/mL (ref 0.00–0.06)

## 2011-02-19 LAB — COMPREHENSIVE METABOLIC PANEL
ALT: 12 U/L (ref 0–35)
AST: 22 U/L (ref 0–37)
Albumin: 3.8 g/dL (ref 3.5–5.2)
Alkaline Phosphatase: 75 U/L (ref 39–117)
BUN: 12 mg/dL (ref 6–23)
CO2: 27 mEq/L (ref 19–32)
Calcium: 10 mg/dL (ref 8.4–10.5)
Chloride: 105 mEq/L (ref 96–112)
Creatinine, Ser: 0.88 mg/dL (ref 0.4–1.2)
GFR calc Af Amer: >60 mL/min (ref >60)
GFR calc non Af Amer: >60 mL/min (ref >60)
Glucose, Bld: 154 mg/dL — ABNORMAL HIGH (ref 70–99)
Potassium: 3.5 mEq/L (ref 3.5–5.1)
Sodium: 140 mEq/L (ref 135–145)
Total Bilirubin: 0.7 mg/dL (ref 0.3–1.2)
Total Protein: 7.4 g/dL (ref 6.0–8.3)

## 2011-02-19 LAB — CBC
HCT: 37.3 % (ref 36.0–46.0)
Hemoglobin: 11.7 g/dL — ABNORMAL LOW (ref 12.0–15.0)
MCH: 30.1 pg (ref 26.0–34.0)
MCHC: 31.4 g/dL (ref 30.0–36.0)
MCV: 95.9 fL (ref 78.0–100.0)
Platelets: 308 10*3/uL (ref 150–400)
RBC: 3.89 MIL/uL (ref 3.87–5.11)
RDW: 13.3 % (ref 11.5–15.5)
WBC: 9 10*3/uL (ref 4.0–10.5)

## 2011-02-19 LAB — DIFFERENTIAL
Basophils Absolute: 0 10*3/uL (ref 0.0–0.1)
Basophils Relative: 0 % (ref 0–1)
Eosinophils Absolute: 0.3 10*3/uL (ref 0.0–0.7)
Eosinophils Relative: 3 % (ref 0–5)
Lymphocytes Relative: 28 % (ref 12–46)
Lymphs Abs: 2.5 10*3/uL (ref 0.7–4.0)
Monocytes Absolute: 0.8 10*3/uL (ref 0.1–1.0)
Monocytes Relative: 9 % (ref 3–12)
Neutro Abs: 5.3 10*3/uL (ref 1.7–7.7)
Neutrophils Relative %: 60 % (ref 43–77)

## 2011-02-19 LAB — URINE MICROSCOPIC-ADD ON

## 2011-02-19 LAB — VITAMIN B12: Vitamin B-12: 557 pg/mL (ref 211–911)

## 2011-02-19 LAB — RPR: RPR Ser Ql: NONREACTIVE

## 2011-02-19 LAB — MAGNESIUM: Magnesium: 2.1 mg/dL (ref 1.5–2.5)

## 2011-02-19 NOTE — H&P (Signed)
Heather [name], BOGA NO.:  [id number]  MEDICAL RECORD NO.:  [id number]           PATIENT TYPE:  E  LOCATION:  WLED                         FACILITY:  [facility]  PHYSICIAN:  Jackie Plum, MD  DATE OF BIRTH:  1935-07-29  DATE OF ADMISSION:  02/18/2011 DATE OF DISCHARGE:                             HISTORY & PHYSICAL   PRIMARY CARE DOCTOR:  [name], MD  CHIEF COMPLAINT:  Visual hallucinations and irrational talk.  This occurred at about 7:30 a.m. today.  HISTORY OF PRESENT ILLNESS:  History is obtainable from the patient and the patient's family members.  The patient is a 76 year old Caucasian female with history of hypertension and hyperlipidemia who was said to have had episodes of blackouts about a month ago.  However, at about 7:30 a.m. today, the patient woke up and was imagining seeing dead family members and also hearing voices.  The patient was also said to have been talking irrationally.  There was however no history of a fall. There was no history of a chest pain.  There is no history of shortness of breath.  There was no history of nausea or vomiting.  No fever.  No chills.  No rigor.  This abnormal behavior was, however, noticed by the caregiver who comes to the house and subsequently called family members and the patient was thereafter brought to the hospital to be evaluated.  PAST MEDICAL HISTORY:  Positive for: 1. Hypercholesterolemia. 2. Hypertension. 3. Hypothyroidism. 4. Periodic self-catheterization.  PAST SURGICAL HISTORY: 1. Hemorrhoidectomy. 2. Hysterectomy.  PREADMISSION MEDICATIONS INCLUDE: 1. Lipitor 20 mg p.o. daily. 2. Synthroid 25 mcg p.o. daily. 3. Diltiazem 240 mg p.o. daily. 4. Diovan 160/25 one p.o. daily. 5. Nexium 40 mg p.o. daily. 6. Lasix 20 mg p.o. daily. 7. Triazolam 0.2 mg p.o. p.r.n. 8. Tramadol/acetaminophen 50 mg p.o. daily. 9. Lyrica 50 mg p.o. b.i.d. 10.Promethazine VC 25 mg p.o.  p.r.n. 11.Hydrocodone/acetaminophen 5/325 p.r.n. 12.Clorazepate dipotassium 7.5 mg p.r.n. 13.Tylenol 650 mg p.r.n.  ALLERGIES:  She has no known allergies.  SOCIAL HISTORY:  Negative for alcohol or tobacco use.  The patient lives at home by herself with an elderly friend she is taking care of.  FAMILY HISTORY:  Said to positive for hypertension and cancer of unknown origin.  REVIEW OF SYSTEMS:  The patient denies any history of headaches.  No blurry vision.  No nausea or vomiting.  No fever, no chills, no rigor. No chest pain or shortness of breath.  Denied cough.  Complained about pain in the suprapubic region, which the patient had dysuria, also has periodic swelling of the lower extremities.  No intolerance to heat or cold, and no neuropsychiatric disorder.  The patient however complained about aches and pains at the low back and also at the shoulder joints.  PHYSICAL EXAMINATION:  GENERAL:  Elderly lady not in any acute distress, moderately dehydrated.  PRESENT VITAL SIGNS:  Blood pressure is 96/59, pulse 67, respiratory rate is 15, temperature is 98.0.  HEENT:  Pallor, but pupils are reactive to light and extraocular muscles are intact. NECK:  No jugular venous distention.  No carotid bruit.  No  lymphadenopathy.  CHEST:  Clear to auscultation.  HEART: Sounds are 1 and 2.  ABDOMEN:  Soft with tenderness in the suprapubic region.  No guarding, no rigidity.  Liver, spleen tip not palpable.  Bowel sounds are positive.  EXTREMITIES:  Show +5 pedal edema.  NEUROLOGIC: Nonfocal.  MUSCULOSKELETAL:  Arthritic changes in the knees and in the feet.  NEUROPSYCHIATRIC:  Unremarkable.  PERTINENT LABORATORY AND X-RAY DATA:  Initial chemistry done on the patient showed sodium of 138, potassium of 3.7, chloride of 102 with a bicarb of 28, glucose is 117, BUN is 16, creatinine is 1.23.  LFT normal.  Hematologic indices showed WBC of 10.9, hemoglobin 11.9, hematocrit 37.3, MCV of 96.4, with a  platelet count of 372 with normal differentials.  Urine microscopy showed WBC 11-20 with few bacteria. First set of cardiac markers troponin-I less than 0.05.  Urine microscopy showed moderate leukocyte esterase.  EKG done on the patient showed normal sinus rhythm with no acute ST-wave change.  Imaging study done include CT of the head without contrast, which was negative for bleed or any acute intracranial process.  Chest x- ray was normal.  X-ray of the right hip did not show any acute findings, and x-ray of the left hip showed mild degenerative changes, no acute findings.  IMPRESSION: 1. Acute confusional state?etiology. 2. Dehydration. 3. Bacteriuria, questionable urinary tract infection. 4. Hypothyroidism. 5. Hyperlipidemia. 6. History of degenerative joint disease.  PLAN:  To admit the patient to telemetry.  The patient will be adequately rehydrated with normal saline to go at a rate of 60 mL an hour.  She empirically will be treated for UTI with Rocephin 1 g IV q.24.  Other medication to be given to the patient will include Lipitor 20 mg p.o. daily for hyperlipidemia and Synthroid 25 mcg p.o. daily for hypothyroidism.  The patient's generalized pain will be managed with Dilaudid 1 mg IV q.4 p.r.n.  For now all antihypertensive medications will be on hold because of borderline blood pressure.  The patient will be on Protonix 40 mg IV q.24 for GI prophylaxis and Lovenox 40 mg subcu q.24 for DVT prophylaxis.  Further labs to be ordered on this patient will include: 1. Cardiac enzymes q.6 x3. 2. Blood culture and urine culture before starting IV antibiotics. 3. Thyroid panel will be ordered which include TSH, T3 and T4, and an     EEG. 4. 2-D echo will be ordered, and MRI and MRA of the brain will also be     done. 5. CBC, the CMP and magnesium will be repeated in the a.m. The patient will be followed and evaluated on a day-to-day basis.     Jackie Plum,  MD     CN/MEDQ  D:  02/18/2011  T:  02/18/2011  Job:  [id number]  Electronically Signed by Jackie Plum  on 02/19/2011 10:22:13 AM

## 2011-02-20 LAB — CBC
HCT: 33.9 % — ABNORMAL LOW (ref 36.0–46.0)
Hemoglobin: 10.7 g/dL — ABNORMAL LOW (ref 12.0–15.0)
MCH: 30.2 pg (ref 26.0–34.0)
MCHC: 31.6 g/dL (ref 30.0–36.0)
MCV: 95.8 fL (ref 78.0–100.0)
Platelets: 291 10*3/uL (ref 150–400)
RBC: 3.54 MIL/uL — ABNORMAL LOW (ref 3.87–5.11)
RDW: 13 % (ref 11.5–15.5)
WBC: 7.4 10*3/uL (ref 4.0–10.5)

## 2011-02-20 LAB — BASIC METABOLIC PANEL
BUN: 9 mg/dL (ref 6–23)
CO2: 26 mEq/L (ref 19–32)
Calcium: 8.7 mg/dL (ref 8.4–10.5)
Chloride: 105 mEq/L (ref 96–112)
Creatinine, Ser: 0.79 mg/dL (ref 0.4–1.2)
GFR calc Af Amer: >60 mL/min (ref >60)
GFR calc non Af Amer: >60 mL/min (ref >60)
Glucose, Bld: 96 mg/dL (ref 70–99)
Potassium: 3.4 mEq/L — ABNORMAL LOW (ref 3.5–5.1)
Sodium: 137 mEq/L (ref 135–145)

## 2011-02-21 ENCOUNTER — Inpatient Hospital Stay (HOSPITAL_COMMUNITY): Payer: Medicare Other

## 2011-02-21 LAB — URINE CULTURE
Colony Count: 55000
Culture  Setup Time: 201203140429

## 2011-02-21 LAB — BASIC METABOLIC PANEL
BUN: 9 mg/dL (ref 6–23)
CO2: 25 mEq/L (ref 19–32)
Calcium: 8.4 mg/dL (ref 8.4–10.5)
Chloride: 109 mEq/L (ref 96–112)
Creatinine, Ser: 0.7 mg/dL (ref 0.4–1.2)
GFR calc Af Amer: >60 mL/min (ref >60)
GFR calc non Af Amer: >60 mL/min (ref >60)
Glucose, Bld: 86 mg/dL (ref 70–99)
Potassium: 3.3 mEq/L — ABNORMAL LOW (ref 3.5–5.1)
Sodium: 139 mEq/L (ref 135–145)

## 2011-02-21 LAB — CBC
HCT: 30.7 % — ABNORMAL LOW (ref 36.0–46.0)
Hemoglobin: 9.9 g/dL — ABNORMAL LOW (ref 12.0–15.0)
MCH: 30.7 pg (ref 26.0–34.0)
MCHC: 32.2 g/dL (ref 30.0–36.0)
MCV: 95.3 fL (ref 78.0–100.0)
Platelets: 263 10*3/uL (ref 150–400)
RBC: 3.22 MIL/uL — ABNORMAL LOW (ref 3.87–5.11)
RDW: 12.8 % (ref 11.5–15.5)
WBC: 6.7 10*3/uL (ref 4.0–10.5)

## 2011-02-24 LAB — CULTURE, BLOOD (ROUTINE X 2)
Culture  Setup Time: 201203132220
Culture  Setup Time: 201203132220
Culture: NO GROWTH
Culture: NO GROWTH

## 2011-02-26 NOTE — Procedures (Signed)
EEG NUMBER:  __________  HISTORY:  This is a 76 year old female with acute confusional state.  MEDICATIONS:  Aspirin, Lovenox, Zocor, Synthroid, Protonix, Rocephin.  CONDITIONS OF RECORDING: 16-channel EEG was carried out with the patient in the awake and drowsy states.  DESCRIPTION:  The waking background activity consists of a low-voltage symmetrical fairly well-organized 7-8 Hz beta activity seen from the parietal, occipital, and posterior temporal regions.  Low-voltage fast activity poorly organized and seen in 2-minute time superimposed on more posterior rhythms.  A mixture of theta and alpha activity are seen from the central and temporal regions.  The patient drowses with slowing to irregular, low voltage theta and beta activity.  Stage II sleep was not obtained.  Hypoventilation and intermittent photic stimulation were not performed.  No epileptiform activity is noted.  IMPRESSION:  This is an abnormal EEG secondary to posterior background slowing.  This finding can be seen with a diffuse gray matter Disturbance that is etiologically nonspecific but may include dementia among other possibilities.  No epileptiform activity is noted.          ______________________________ Alexis Goodell, MD    [id number] D:  02/19/2011 18:51:22  T:  02/19/2011 23:57:41  Job #:  [id number]

## 2011-02-27 ENCOUNTER — Ambulatory Visit (INDEPENDENT_AMBULATORY_CARE_PROVIDER_SITE_OTHER): Payer: Medicare Other | Admitting: Internal Medicine

## 2011-02-27 ENCOUNTER — Encounter: Payer: Self-pay | Admitting: Internal Medicine

## 2011-02-27 DIAGNOSIS — F329 Major depressive disorder, single episode, unspecified: Secondary | ICD-10-CM

## 2011-02-27 DIAGNOSIS — M25559 Pain in unspecified hip: Secondary | ICD-10-CM

## 2011-02-27 DIAGNOSIS — I10 Essential (primary) hypertension: Secondary | ICD-10-CM

## 2011-02-27 DIAGNOSIS — M545 Low back pain, unspecified: Secondary | ICD-10-CM

## 2011-02-27 NOTE — Patient Instructions (Signed)
Limit your sodium (Salt) intake  Please check your blood pressure on a regular basis.  If it is consistently greater than 150/90, please make an office appointment.  Return in one month for follow-up  

## 2011-02-27 NOTE — Progress Notes (Signed)
Subjective:    Patient ID: Heather [name], female    DOB: [birth year], 76 y.o.   MRN: [id number]  Hypertension Pertinent negatives include no chest pain, headaches, palpitations or shortness of breath.  Atrial Fibrillation Symptoms include hypertension. Symptoms are negative for chest pain, dizziness, palpitations, shortness of breath and weakness. Past medical history includes atrial fibrillation and hyperlipidemia.  Hyperlipidemia Pertinent negatives include no chest pain or shortness of breath.  Urinary Tract Infection  Pertinent negatives include no flank pain, frequency, hematuria, nausea, urgency or vomiting.     76 year old patient who is seen today following hospital discharge. She was admitted for evaluation and treatment of a metabolic encephalopathy related to a urinary tract infection and dehydration. Hospital records reviewed. Evaluation included a brain MRI that revealed chronic small vessel disease only. There is no evidence of an acute stroke. She was discharged on diltiazem but the Ativan a CT has been on hold. She has been afebrile since her hospital discharge.  She had multiple complaints about her treatment in the hospital. She states that the physicians he was in contact with were all very professional that she did not feel she was kept well informed with her testing. There was apparently some rude behavior by other medical personnel. She is still quite upset about her hospital experience.  she was discharged on a small dose of oxycodone. The patient has been told that this office does not prescribe this medication chronically. Prior to going into the hospital she was scheduled to see pain management  Review of Systems  Constitutional: Negative.   HENT: Negative for hearing loss, congestion, sore throat, rhinorrhea, dental problem, sinus pressure and tinnitus.   Eyes: Negative for pain, discharge and visual disturbance.  Respiratory: Negative for cough and shortness of  breath.   Cardiovascular: Negative for chest pain, palpitations and leg swelling.  Gastrointestinal: Negative for nausea, vomiting, abdominal pain, diarrhea, constipation, blood in stool and abdominal distention.  Genitourinary: Negative for dysuria, urgency, frequency, hematuria, flank pain, vaginal bleeding, vaginal discharge, difficulty urinating, vaginal pain and pelvic pain.  Musculoskeletal: Positive for back pain and arthralgias. Negative for joint swelling and gait problem.       [ Chronic left hip pain Skin: Negative for rash.  Neurological: Negative for dizziness, syncope, speech difficulty, weakness, numbness and headaches.  Hematological: Negative for adenopathy.  Psychiatric/Behavioral: Positive for dysphoric mood. Negative for behavioral problems and agitation. The patient is not nervous/anxious.        Objective:   Physical Exam  Constitutional: She is oriented to person, place, and time. She appears well-developed and well-nourished. No distress.  HENT:  Head: Normocephalic.  Right Ear: External ear normal.  Left Ear: External ear normal.  Mouth/Throat: Oropharynx is clear and moist.  Eyes: Conjunctivae and EOM are normal. Pupils are equal, round, and reactive to light.  Neck: Normal range of motion. Neck supple. No thyromegaly present.  Cardiovascular: Normal rate, regular rhythm, normal heart sounds and intact distal pulses.   Pulmonary/Chest: Effort normal and breath sounds normal.  Abdominal: Soft. Bowel sounds are normal. She exhibits no mass. There is no tenderness.  Musculoskeletal: Normal range of motion.  Lymphadenopathy:    She has no cervical adenopathy.  Neurological: She is alert and oriented to person, place, and time.  Skin: Skin is warm and dry. No rash noted.  Psychiatric: She has a normal mood and affect. Her behavior is normal.          Assessment & Plan:   status  post metabolic encephalopathy secondary to UTI  Chronic pain syndrome. Followup  pain management  Hypertension. We'll continue to hold the Diovan we'll recheck her blood pressure in 4 weeks  Chronic low back pain and left hip pain. Followup at pain management

## 2011-03-04 NOTE — Discharge Summary (Signed)
Heather [name], Heather [name]           ACCOUNT NO.:  [id number]  MEDICAL RECORD NO.:  [id number]           PATIENT TYPE:  I  LOCATION:  [id number]                         FACILITY:  [facility]  PHYSICIAN:  Kathie Dike, MD     DATE OF BIRTH:  [birth year]  DATE OF ADMISSION:  02/18/2011 DATE OF DISCHARGE:  02/22/2011                              DISCHARGE SUMMARY   PRIMARY CARE PHYSICIAN:  [name], MD from [facility].  DISCHARGE DIAGNOSES: 1. Metabolic encephalopathy. 2. Urinary tract infection. 3. Dehydration. 4. Abdominal pain secondary to constipation, resolved. 5. Hypothyroidism. 6. Hyperlipidemia. 7. History of degenerative joint disease with chronic hip pain. 8. Gastroesophageal reflux disease.  DISCHARGE MEDICATIONS: 1. Augmentin 875 mg p.o. b.i.d. 2. MiraLax 17 g by mouth daily. 3. Percocet 5/325 mg one tablet by mouth every 4 hours as needed. 4. Tylenol 650 mg one tablet by mouth every 6 hours as needed. 5. Diltiazem CD 240 mg 1 capsule by mouth daily. 6. Lasix 20 mg 1 tablet by mouth every morning. 7. Hydrocodone/APAP 5/500 mg 1 tablet by mouth every 6 hours as     needed. 8. Levothyroxine 25 mcg 1 tablet by mouth daily. 9. Lipitor 20 mg 1 tablet by mouth daily. 10.Lyrica 50 mg 1 tablet by mouth twice daily. 11.Nexium 40 mg 1 capsule by mouth daily. 12.Triazolam 0.25 mg 1 to 2 tablets by mouth daily at bedtime as     needed.  ADMISSION HISTORY:  This is a 76 year old female who was brought to the emergency room with altered mental status.  The patient had woken up on the morning of admission with visual hallucination.  She was also hearing voices.  She was subsequently admitted to the hospital with altered mental status.  For further details, please refer the history and physical dictated by Dr. Jackie Plum.  HOSPITAL COURSE: 1. Altered mental status secondary to urinary tract infection.  The     patient has had positive urinalysis.  She was started  empirically     on Rocephin and was given some IV fluids.  With these measures, her     mental status has returned to baseline.  She has had no more     hallucinations and she is currently back to her baseline.  We will     continue with course of Augmentin.  Her urine culture was positive     for coagulase-negative staph.  We will complete a total of 7 days     of antibiotics. 2. Abdominal pain.  The patient had significant abdominal pain.  Her     abdominal x-ray showed constipation with gas.  She was given enemas     as well as MiraLax.  Since then, her abdominal pain has resolved     with bowel movements. 3. Chronic pain.  The patient has chronic hip pain secondary to     degenerative joint disease.  She has followed up with her primary     care physician for this and may eventually need an orthopedic     referral.  We will defer this to the primary care physician. 4. History of hypertension.  The patient was on Diovan as well as     diltiazem.  Her blood pressure was initially on the lower side.     Therefore, both of these medications were held on admission.  Her     blood pressure has somewhat improved.  We will restart the     diltiazem at this time but continue to hold the Diovan.  On repeat     visit with her primary care physician, her blood pressure can be     reassessed and if felt appropriate, the Diovan can be restarted at     that time. 5. Remainder the patient's medical issues have remained stable.  CONSULTATIONS:  None.  PROCEDURES:  None.  DIAGNOSTIC IMAGING: 1. X-ray of the left hip shows no acute findings. 2. X-ray of right hip shows no acute findings. 3. Chest x-ray from February 18, 2011, shows no acute cardiopulmonary     disease. 4. CT head February 18, 2011, shows negative for bleed or other acute     intracranial process. 5. MRI of the brain on February 19, 2011, shows mild chronic     microvascular ischemia in the white matter, no acute infarct.  MRA     of  the head shows probable mild intracranial atherosclerotic     disease without large vessel occlusion. 6. Abdominal x-ray on February 21, 2011, shows fairly large amount of gas     and stool in the colon that could be asymptomatic.  No sign of     obstruction or small bowel abnormality. 7. A 2D echocardiogram shows wall thickness was normal, systolic     function of 123456 to 65% with no regional wall motion abnormalities,     grade 1 diastolic dysfunction.  DISCHARGE INSTRUCTIONS:  The patient should follow up with her primary care physician within the next 1 week.  She will be set up with home health services including PT, OT and a health aide.  She should continue on heart-healthy diet, conduct her activity as tolerated.  Plan was discussed with the patient who is also in agreement.  CONDITION ON DISCHARGE:  Condition at the time of discharge is improved.  TIME SPENT ON DISCHARGE:  30 minutes.     Kathie Dike, MD     JM/MEDQ  D:  02/22/2011  T:  02/22/2011  Job:  [id number]  cc:   [name], MD [place] [address]  Electronically Signed by Kathie Dike  on 03/04/2011 [phone] PM

## 2011-03-07 ENCOUNTER — Other Ambulatory Visit: Payer: Self-pay

## 2011-03-07 MED ORDER — TRIAZOLAM 0.25 MG PO TABS: 0.2500 mg | ORAL_TABLET | Freq: Every evening | ORAL | Status: DC | PRN

## 2011-03-07 NOTE — Telephone Encounter (Signed)
Faxed back to [facility]

## 2011-03-18 ENCOUNTER — Telehealth: Payer: Self-pay | Admitting: *Deleted

## 2011-03-18 NOTE — Telephone Encounter (Signed)
Pt has cancelled all PT from [facility].

## 2011-03-21 ENCOUNTER — Ambulatory Visit: Payer: Self-pay | Admitting: Internal Medicine

## 2011-03-25 ENCOUNTER — Other Ambulatory Visit: Payer: Self-pay

## 2011-03-25 MED ORDER — HYDROCODONE-ACETAMINOPHEN 5-500 MG PO TABS: 1.0000 | ORAL_TABLET | Freq: Four times a day (QID) | ORAL | Status: DC | PRN

## 2011-03-25 NOTE — Telephone Encounter (Signed)
Faxed back to pharmacy

## 2011-03-25 NOTE — Telephone Encounter (Signed)
#  [phone]  RF 2

## 2011-03-25 NOTE — Telephone Encounter (Signed)
Refill request for vicodin 5- 550mg   - last fill 11/2010  #[phone]  Please advise

## 2011-03-31 ENCOUNTER — Encounter: Payer: Self-pay | Admitting: Internal Medicine

## 2011-03-31 ENCOUNTER — Ambulatory Visit (INDEPENDENT_AMBULATORY_CARE_PROVIDER_SITE_OTHER): Payer: Medicare Other | Admitting: Internal Medicine

## 2011-03-31 DIAGNOSIS — F411 Generalized anxiety disorder: Secondary | ICD-10-CM

## 2011-03-31 DIAGNOSIS — I10 Essential (primary) hypertension: Secondary | ICD-10-CM

## 2011-03-31 DIAGNOSIS — M545 Low back pain, unspecified: Secondary | ICD-10-CM

## 2011-03-31 MED ORDER — SERTRALINE HCL 25 MG PO TABS: 25.0000 mg | ORAL_TABLET | Freq: Every day | ORAL | Status: DC

## 2011-03-31 NOTE — Patient Instructions (Signed)
Increase Lasix to 2 daily for the next 3 days Limit your sodium (Salt) intake  Keep her legs elevated as much as possible  Call if you develop  worsening swelling  Return office visit 2 months

## 2011-03-31 NOTE — Progress Notes (Signed)
  Subjective:    Patient ID: Heather [name], female    DOB: 04-07-1935, 76 y.o.   MRN: [id number]  HPI is a 76 year old patient who is seen today for followup. She has a history of hypertension which is controlled on diltiazem. For the past few days she has had some increasing pedal edema. She does take Lasix 20 mg every morning. In general she has done quite well she has been hospitalized recently for a metabolic encephalopathy. She is followed by chronic pain management. She has osteoarthritis hypothyroidism.   When discharged instructions were being discussed the patient brought up depression. She feels that she would benefit from an antidepressant.  Review of Systems  Constitutional: Negative.   HENT: Negative for hearing loss, congestion, sore throat, rhinorrhea, dental problem, sinus pressure and tinnitus.   Eyes: Negative for pain, discharge and visual disturbance.  Respiratory: Negative for cough and shortness of breath.   Cardiovascular: Positive for leg swelling. Negative for chest pain and palpitations.  Gastrointestinal: Negative for nausea, vomiting, abdominal pain, diarrhea, constipation, blood in stool and abdominal distention.  Genitourinary: Negative for dysuria, urgency, frequency, hematuria, flank pain, vaginal bleeding, vaginal discharge, difficulty urinating, vaginal pain and pelvic pain.  Musculoskeletal: Negative for joint swelling, arthralgias and gait problem.  Skin: Negative for rash.  Neurological: Negative for dizziness, syncope, speech difficulty, weakness, numbness and headaches.  Hematological: Negative for adenopathy.  Psychiatric/Behavioral: Negative for behavioral problems, dysphoric mood and agitation. The patient is not nervous/anxious.        Objective:   Physical Exam  Constitutional: She is oriented to person, place, and time. She appears well-developed and well-nourished. No distress.       Blood pressure 130/80  HENT:  Head: Normocephalic.    Right Ear: External ear normal.  Left Ear: External ear normal.  Mouth/Throat: Oropharynx is clear and moist.  Eyes: Conjunctivae and EOM are normal. Pupils are equal, round, and reactive to light.  Neck: Normal range of motion. Neck supple. No thyromegaly present.  Cardiovascular: Normal rate, regular rhythm, normal heart sounds and intact distal pulses.   Pulmonary/Chest: Effort normal and breath sounds normal.       O2 saturation 94-95%  Abdominal: Soft. Bowel sounds are normal. She exhibits no mass. There is no tenderness.  Musculoskeletal: Normal range of motion.  Lymphadenopathy:    She has no cervical adenopathy.  Neurological: She is alert and oriented to person, place, and time.  Skin: Skin is warm and dry. No rash noted. There is erythema.       +2 ankle and pedal edema  Psychiatric: She has a normal mood and affect. Her behavior is normal.            Assessment and plan-  Hypertension well controlled Pedal edema. We'll increase her Lasix to 40 mg daily for 3 days. We'll attempt to elevate feet and restrict her sodium Osteoarthritis Depression. We'll start the patient on sertraline 25 mg daily and see back in 4 weeks

## 2011-04-05 ENCOUNTER — Other Ambulatory Visit: Payer: Self-pay | Admitting: Internal Medicine

## 2011-04-08 ENCOUNTER — Ambulatory Visit: Payer: Medicare Other | Admitting: Physical Medicine & Rehabilitation

## 2011-04-14 ENCOUNTER — Other Ambulatory Visit: Payer: Self-pay | Admitting: Internal Medicine

## 2011-04-25 NOTE — Procedures (Signed)
[facility]  Patient:    Heather [name], Heather [name]                  MRN: [id number] Proc. Date: 07/29/00 Adm. Date:  [id number] Attending:  Ernie Avena CC:         Dennie Bible. Drue Flirt, M.D.   Procedure Report  PROCEDURE:  Colonoscopywith biopsies.  INDICATION:  76 year-old female with change in bowel habits, toward diarrhea, where she used to have daily bowel movements or even constipation. Now she is having several loose bowel movements a day.  Rule out microscopic colitis.  FINDINGS:  Normal exam to the terminal ileum except for moderate sigmoid mycosis and  diverticulosis and a rare right-sided diverticulum.  INFORMED CONSENT:  The nature, purpose, and risks of the procedure were familiar to the patient from prior examination.  She provided written consent.   SEDATION:  Droperidol 5 mg, fentanyl 75 mcg and Versed 8 mg IV to a level of mildsedation without arrhythmias or desaturation.  DESCRIPTION OF PROCEDURE:  The Olympus pediatric video colonoscope was advanced fairly easily around a somewhat fixated and angulated sigmoid region to the terminal ileum which had a normal appearance, being inspected for about 5-8 cm.  Pullback was then performed.  The quality of the prep was excellent, and it is felt that all areas were well seen.  There were some sigmoid diverticula along with associated muscular thickening (mycosis), and also rare right-sided diverticulum, but I did not see any evidence of polyps, cancer, or colitis, and, in fact, the vascular pattern of the colonic mucosa looked very, very normal.  Retroflexion was performed in the rectum which led to a superficial abrasion of the rectal mucosa but no evidence of frank perforation.  There appeared to be some internal hemorrhoids which were also visible on perianal inspection.  Random mucosal biopsies wereobtained from the terminal ileum and along the length of the colon.   Thepatient tolerated the procedure quite well, and there were no evident complications.  IMPRESSION:  Essentially normal colonoscopy except for diverticular disease. specifically, no source of diarrhea endoscopically-evident.  PLAN:  Await pathology on todays biopsies. DD:  07/30/15 TD:  07/30/00 Job: [id number]

## 2011-04-25 NOTE — Discharge Summary (Signed)
NAMETONNI, HACKENBURG NO.:  [id number]   MEDICAL RECORD NO.:  [id number]          PATIENT TYPE:  INP   LOCATION:  9303                          FACILITY:  [facility]   PHYSICIAN:  Lovenia Kim, M.D.DATE OF BIRTH:  08-17-1935   DATE OF ADMISSION:  12/20/2004  DATE OF DISCHARGE:  12/22/2004                                 DISCHARGE SUMMARY   HOSPITAL COURSE:  The patient underwent uncomplicated diagnostic  laparoscopy, exploratory laparotomy, BSO, lysis of adhesions and pelvic  washings on December 20, 2004.  Postoperative course was complicated on day  #1 by low grade temperature and postoperative ileus.  Over the next 24  hours, ileus has resolved, tolerating liquids without difficulty, passing  flatus without difficulty, ambulating without difficulty.  Vital signs are  stable.  Postoperative anemia noted with a hemoglobin of 8.9, hematocrit of  25.5.   DISCHARGE MEDICATIONS:  She is discharged to home on Percocet #30 and her  previous medications as noted.   FOLLOW UP:  She is to follow up in the office in two days for staple  removal.  Discharge teaching and incision care discussed.  The patient  acknowledges and will follow up at [facility] in two days.     Rich   RJT/MEDQ  D:  12/22/2004  T:  12/22/2004  Job:  [id number]

## 2011-04-25 NOTE — Op Note (Signed)
NAMENERY, MUZNY           ACCOUNT NO.:  [id number]   MEDICAL RECORD NO.:  [id number]          PATIENT TYPE:  AMB   LOCATION:  [place]                           FACILITY:  [facility]   PHYSICIAN:  Lovenia Kim, M.D.DATE OF BIRTH:  1935/11/28   DATE OF PROCEDURE:  12/20/2004  DATE OF DISCHARGE:                                 OPERATIVE REPORT   PREOPERATIVE DIAGNOSES:  1.  Pelvic mass.  2.  Bilateral ovarian cyst.   POSTOPERATIVE DIAGNOSES:  1.  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   PROCEDURE:  Diagnostic open laparoscopy, pelvic washings, exploratory  laparotomy, BSO, left ureteral dissection with unroofing of the left ureter.   SURGEON:  Lovenia Kim, M.D.   ASSISTANT:  Freddie Apley, M.D.   ESTIMATED BLOOD LOSS:  50 mL.   COMPLICATIONS:  None.   DRAINS:  Foley.   COUNTS:  Correct.   Patient to recovery in good condition. Frozen section to pathology.   DESCRIPTION OF PROCEDURE:  After being apprised of the risks of anesthesia,  infection, bleeding, injury to abdominal organs and need for repair,  possibility for need for exploratory laparotomy, the patient was brought to  the operating room where she was administered general anesthetic without  complications, prepped and draped in the usual sterile fashion, Foley  catheter placed, sponge stick forceps placed per vagina.  An infraumbilical  incision made with the scalpel, fascia identified and opened transversely  using Mayo scissors, laparoscope placed. A pursestring suture of #0 Vicryl  suture placed, atraumatic trocar entry noted.  Normal liver and gallbladder  bed, normal omentum and bowel. Appendix not visualized.  Pelvic masses  identified bilaterally with cystic and solid component thought to be too  large to address laparoscopically thereby the procedure is terminated at  this time.  CO2 is released through the Hasson laparoscope, fascia closed  using a pursestring suture and skin  closed using Dermabond and bilateral  lower quadrant incisions were also closed. These were 5 mm trocar sites  which were made and closed using Dermabond. They were made under direct  visualization for manipulation.  Please note that during the laparoscopic  procedure, pelvic washings are collected approximately 500 mL of lactated  Ringer's solution and kept for permanent evaluation.  At this time after  closing all incisions, attention is turned to previous Pfannenstiel skin  incision which is made with the scalpel after placement of a dilute Marcaine  solution.  The fascia is then identified and opened transversely using Mayo  scissors, rectus muscles dissected sharply in the midline, peritoneum  entered sharply, Balfour retractor placed. At this time, the left adnexa was  addressed, the ureter is identified. There are bowel adhesions to the left  ovary which are dissected sharply off of the left ovary and at the level of  the attachment to the bowel mesentery, they are clamped and divided and  suture tied.  At this time, the left ovary is mobilized but there is some  adhesions into the left pelvic fossa thereby the retroperitoneal entry is  obtained. The left round ligament is identified, clamped and ligated  using  electrocautery.  The ureters are then identified and infundibulopelvic  ligament on the left side is clamped and ligated with a suture and a free  tie at this time. The ovary is dissected sharply. Because of its close  adherence to the left ureter, the left ureter is dissected off the  peritoneum and unroofed in its entirety in order to note its separation from  the existing left pelvic mass. The pelvic mass is then clamped where it  attaches to the peritoneum and removed and tied. On the right side, there is  no evidence of bowel adhesion to the right tuboovarian round ligament  complex.  At this time, the tube is dissected sharply off the peritoneum,  retroperitoneal entry  is made, infundibulopelvic ligament and right ureter  identified, infundibulopelvic ligament is clamped, ligated and tied. At this  time, the mass is then dissected sharply off the peritoneum, ureter is  identified throughout its extent along the right side but is not adherent to  the mass as was the case on the left side.  The specimen is then removed,  clamped at its attachment and tied to the attachment to the peritoneum and  the round ligament on the right side was also ligated and cauterized  allowing retroperitoneal entry and more easy removal. At this time both  masses are sent for frozen section and frozen section confirms bilateral  ovarian cystadenofibroma. Irrigation is accomplished, good hemostasis noted,  both ureters are found to be peristalsing normally. At this time, all  instruments are removed, counts are correct, irrigation is accomplished,  fascia closed using a #0 Vicryl in continuous running fashion.  The skin  closed using staples. The patient tolerated the procedure well and was  awakened and transferred to recovery in good condition.     Rich   RJT/MEDQ  D:  12/20/2004  T:  12/20/2004  Job:  [id number]

## 2011-04-25 NOTE — Consult Note (Signed)
[place]  Patient:    Heather [name], Heather [name]                  MRN: [id number] Proc. Date: 04/02/01 Adm. Date:  [id number] Disc. Date: [id number] Attending:  Katy Apo CC:         [name], M.D.  Cleotis Nipper, M.D.   Consultation Report  HISTORY OF PRESENT ILLNESS:  The patient presented to the emergency room tonight complaining of severe left-sided abdominal pain with diarrhea every 45 minutes or so, getting progressively worse since Tuesday of this week.  She has also been having rectal bleeding, mostly in the early morning, when the stool is somewhat firmer, progressing without watery diarrhea later in the day.  She has a diagnosis of collagenous colitis based on an August 2001 colonoscopy with biopsies.  She also has some degree of diverticulosis.  In addition, she has had a history of reflux with dilation of a possible stricture seen on a barium swallow in 1997.  A 12 mm barium tablet hung up at that point.  She underwent dilation, and was well for four years, although she has again noted progressive dysphagia, and is scheduled for a repeat upper endoscopy and dilation in May.  She also has a history of hemorrhoids, and had a hemorrhoidectomy in the past.  When she was seen by Dr. [name] in the office in March, she had a rigid sigmoidoscopy because of complaints of rectal bleeding.  Only hemorrhoids were seen.  PAST MEDICAL HISTORY:  Pertinent for recent disequilibrium, questionably secondary to inner ear problems, and for anxiety.  There is also a question of esophageal spasm.  CURRENT MEDICATIONS:  Ogen, one q.d.  Halcion p.r.n.  Calcium 600 mg b.i.d. Centrum Silver vitamins.  Darvocet-N 100, which she discontinued early in the week because she thought maybe it was aggravating her colon.  Asacol, 12 pills q.d.  Protonix 40 mg q.d.  Tranxene 7.5 mg t.i.d.  ALLERGIES:  Vioxx and Flagyl.  FAMILY HISTORY:   Noncontributory.  SOCIAL HISTORY:  She smokes at least 1/2 pack of cigarettes per day and does not drink alcohol.  She is a widow.  REVIEW OF SYSTEMS:  GENERAL: Questionable weight loss.  No night sweats. ENDOCRINE: No history of diabetes or thyroid problems.  SKIN: No rash or pruritus.  EYES: No icterus or change in vision.  ENT: No aphthous ulcers or chronic sore throat.  RESPIRATORY:  No shortness of breath, cough or wheezing. CARDIAC: No chest pain, palpitations or history of valvular heart disease. GASTROINTESTINAL: As above.  GENITOURINARY: No dysuria or hematuria. NEUROLOGIC: She is complaining of a diffuse headache, as she has noted in the past.  She denies any focal neurologic symptoms.  PHYSICAL EXAMINATION:  VITAL SIGNS:  Afebrile.  Initial blood pressure 189/87, later noted to be 167/72.  Pulse 111, respiratory rate 20.  GENERAL:  No acute distress.  SKIN:  Normal.  HEENT:  Eyes are anicteric.  Pupils are somewhat dilated and only mildly reactive to light.  Funduscopic exam is normal.  Oropharynx is unremarkable.  NECK:  Supple without thyromegaly.  There is no cervical or inguinal adenopathy.  CHEST:  Few scattered rhonchi.  HEART:  Regular rate and rhythm.  ABDOMEN:  Soft with minimal left lower quadrant tenderness to deep palpation. There in no rebound or hernia.  Bowel sounds are normal.  RECTAL:  Minimal external hemorrhoids.  There is no blood on the examining finger.  Stool (  which is formed) is guaiac negative.  EXTREMITIES:  Without clubbing, cyanosis, edema or rash.  LABORATORY DATA:  Abdominal x-ray series is unremarkable.  CBC reveals hemoglobin 13.2, white blood cell count 8, platelet count 608 (thrombocytosis has been noted before).  Complete metabolic panel is totally normal.  IMPRESSION: 1. Collagenous colitis with questionable recent exacerbation. 2. Rectal bleeding which cannot be documented and, in the face of a normal    hemoglobin and  negative rectal exam, I do not believe requires admission. 3. Anxiety. 4. Probable tension headache. 5. Dysphagia with an endoscopy and dilation already scheduled.  RECOMMENDATIONS:  I have tried to reassure the patient that I do not think she requires admission, and have informed her so.  I have told her to take Imodium p.r.n. and to resume her Asacol.  I also think that her abrupt discontinuation of Darvocet may have aggravated her diarrhea and precipitated her headache, and she probably should resume the same medication.  She is to call our office Monday and arrange to be seen by Dr. [name].  Headaches should be addressed by her primary physician. DD:  04/02/01 TD:  04/04/01 Job: [id number]

## 2011-04-25 NOTE — H&P (Signed)
Heather [name], SAVITZ NO.:  [id number]   MEDICAL RECORD NO.:  [id number]          PATIENT TYPE:  AMB   LOCATION:  [place]                           FACILITY:  [facility]   PHYSICIAN:  Lovenia Kim, M.D.DATE OF BIRTH:  [birth year]   DATE OF ADMISSION:  DATE OF DISCHARGE:                                HISTORY & PHYSICAL   CHIEF COMPLAINT:  Pelvic mass.   HISTORY OF PRESENT ILLNESS:  The patient is a 76 year old white female, G1,  P1, who presented for a consultation with bilateral ovarian cysts.  She has  had a normal CA125 and bilateral cystic pelvic masses confirmed by  ultrasound for definitive evaluation, removal, and possible staging.   MEDICATIONS:  1.  Purinethol for ulcerative colitis.  2.  Phenergan.  3.  Nexium.  4.  Tranxene.  5.  Ogen, which has been discontinued.  6.  Darvocet.  7.  Halcion p.r.n. sleep.   ALLERGIES:  METRONIDAZOLE.   PAST SURGICAL HISTORY:  She has a history of a hemorrhoid surgery  approximately 12 years ago.   FAMILY HISTORY:  Breast cancer, diabetes, and heart disease.   MEDICAL PROBLEMS:  1.  Status post hysterectomy.  2.  History of ulcerative colitis.  3.  Pelvic masses, as noted.   She has had a gynecologic oncology consultation with Dr. Kathyrn Drown.  In  addition, her past medical history was remarkable for esophageal stricture  and esophageal stretching.  She is a half to three-quarter pack a day  smoker.  She denies domestic or physical violence.   PHYSICAL EXAMINATION:  GENERAL:  She is a well-developed, well-nourished,  white female in no apparent distress.  HEENT:  Normal.  LUNGS:  Clear.  HEART:  Regular rate and rhythm.  ABDOMEN:  Soft, nontender.  PELVIC:  An absent uterus and cervical and pelvic fullness, consistent with  previously-noted pelvic masses.  EXTREMITIES:  No cords.  NEUROLOGIC:  Nonfocal.   IMPRESSION:  Bilateral pelvic masses with normal CA125; rule out ovarian  neoplasm versus benign  entity.   PLAN:  The plan is to proceed with open diagnostic laparoscopy, probable  BSO, peritoneal washings, possible biopsy, possible need for staging  operation.  Risks of anesthesia, infection, bleeding, injury to abdominal  organs with need for repair was discussed. Complications to include bowel  and bladder injury noted, possible inability to cure cancer at the time of  operation has been noted.  The patient acknowledges and wishes to proceed.     Rich   RJT/MEDQ  D:  12/19/2004  T:  12/19/2004  Job:  [id number]   cc:   Erling Conte

## 2011-04-25 NOTE — Discharge Summary (Signed)
[name], [name]   MEDICAL RECORD NO.:  [id number]          PATIENT TYPE:  INP   LOCATION:  [id number]                         FACILITY:  [facility]   PHYSICIAN:  Sherryl Manges, M.D.  DATE OF BIRTH:  24-Jun-1935   DATE OF ADMISSION:  12/26/2005  DATE OF DISCHARGE:  12/31/2005                                 DISCHARGE SUMMARY   DISCHARGE DIAGNOSES:  1.  Acute diverticulitis.  2.  Collagenous colitis.  3.  Macrocytic anemia. Status post blood transfusion of 2 units packed red      blood cells.  4.  Vitamin B12/Folate deficiency.  5.  Hypertension.  6.  Gastroesophageal reflux disease.  7.  History of anxiety.   DISCHARGE MEDICATIONS:  1.  Nexium 40 mg p.o. daily.  2.  Purinethol 7.5 mg p.o. daily.  3.  Estradiol 1.5 mg p.o. daily.  4.  Cardizem CD 240mg  p.o. daily.  5.  Tranxene 7.5 mg p.o. t.i.d. p.r.n.  6.  Asacol (400 mg) 4 capsules p.o. t.i.d.  7.  Halcion 0.25 mg 1 p.o. p.r.n. q.h.s.  8.  Ciprofloxacin 500 mg p.o. b.i.d., to be completed on January 04, 2006.  9.  Folic acid 1 mg p.o. daily.  10. Vitamin B12 1 mg IM monthly.  The first dose was given on December 30, 2005.  11. Synthroid 71mcg p.o. daily.  12. Vicodin (5/500) 1pill p.r.n. 6hourly.   PROCEDURES:  Abdominal/pelvic CT scan dated December 26, 2005.  This showed  extensive descending colonic diverticulosis, faint increased density  adjacent to the ascending colon, which may represent minimal diverticulitis,  possible small left renal calculus versus vascular calcification.  No  hydronephrosis.  There is also a fatty liver.  Patient is status post  hysterectomy and resection of ovarian masses.   CONSULTATIONS:  Dr. [name], gastroenterology.   ADMISSION HISTORY:  As in H&P notes of December 26, 2005. However, in brief,  this is a 76 year old female, with known history of collagenous colitis,  under the care of Dr. [name] for the past eight years, who presents with  diffuse abdominal pain, maximum in both lower quadrants, as well as  increased frequency of bowel movements.  She was admitted for evaluation,  investigation, and management.   CLINICAL COURSE:  1.  Acute diverticulitis:  Patient presents with diffuse abdominal pain,      maximal in both lower quadrants, also increased stool frequency.  Given      her known history of collagenous colitis, which at the time of      presentation, was thought to be ulcerative colitis, the possibility of a      flare-up of colitis was considered.  Patient was placed on bowel rest,      intravenous fluid hydration, stool samples were sent for microscopy,      fecal leukocytes, C. difficile toxin. As abdominal/pelvic CT scan      indicated minimal diverticulitis, the patient was started on      ciprofloxacin.  She is noted to be allergic to FLAGYL.  Also, she was  empirically started on parenteral steroids.  Gastroenterology      consultation was called, which was kindly provided by Dr. [name],      who concluded that this was not actually flare-up of colitis, but that      the clinical picture was much more consistent with diverticulitis.  The      patient's steroids were therefore discontinued.  Further review of      patient's history by Dr. [name], revealed that the patient did indeed      have collagenous colitis, as opposed to ulcerative colitis.  She      continues to be followed up by Dr. [name].  The patient      responded to above-mentioned measures.  She clinically improved, and was      subsequently advanced to a regular diet.  It is anticipated that she      will complete a total of 10 days of ciprofloxacin.  As of December 30, 2005, stool frequency has markedly decreased, and the patient is passing      only semi-solid stools.  She is actually no longer troubled by abdominal      pain.   1.  Macrocytic anemia:  At the time of presentation, the patient was found      to have a  hemoglobin of 11.4 with a hematocrit of 33.1.  Against a      background of intravenous fluid hydration, the patient subsequently      experienced a drop in hemoglobin, and this was found to be significant      at 8.7 on December 29, 2005.  MCV at that time was noted to be 109, i.e.,      macrocytic anemia, necessitating hematinic studies.  These were carried      out on December 29, 2005 and showed an iron of 53, TIBC 275, percent      saturation 19, ferritin 43.  B12 was 294, which is at best marginal.      Folate was 4.3.  The patient has therefore been commenced on B12 and      folate supplements.  She is expected to have monthly B12 injections,      which we expect the patient's PMD to arrange, and follow her hematologic      indices.   1.  Hypertension:  This remained controlled during the course of the      patient's hospital stay, initially with Catapres-TTS patch while patient      was n.p.o. and subsequently with Diltiazem 60 mg p.o. t.i.d.  We expect      the patient's PMD to follow up on the blood pressure and ajust      medications appropriately, in due course.   1.  History of gastroesophageal reflux disease:  This was managed with      proton pump inhibitor treatment.   1.  Hypothyroidism:  As part of workup for macrocytic anemia, the patient's      thyroid profile was done.  TSH was found to be elevated at 8.082.  The      patient has therefore been started on low dose Synthroid, i.e., 25 mcg      p.o. daily.  TSH should be repeated in approximately four weeks, which      we shall leave to this patient's PMD.   DISPOSITION:  Patient has significantly improved during the course of her  hospital stay and is now  considered clinically stable for discharge to be  considered.  It is anticipated that she will be discharged on or about  January 01, 2006.   DIET:  Healthy heart disease.   ACTIVITY:  As tolerated.   WOUND CARE:  Not applicable.  PAIN MANAGEMENT:  Not  applicable.   FOLLOW-UP INSTRUCTIONS:  Patient is expected to follow up with her PMD, Dr.  [name], in approximately one week.  She is to call for an  appointment.  She is also expected to follow up with gastroenterologist, Dr.  [name], per prior scheduled appointment.  All of this has been  communicated to the patient.  She has verbalized understanding.      Sherryl Manges, M.D.  Electronically Signed     CO/MEDQ  D:  12/30/2005  T:  12/30/2005  Job:  [id number]   cc:   Doree Albee, M.D.  Fax: [phone]   [name], M.D.  Fax: [phone]   Wonda Horner, M.D.  Fax: [phone]

## 2011-04-25 NOTE — Consult Note (Signed)
Heather [name], Heather [name]   MEDICAL RECORD NO.:  [id number]          PATIENT TYPE:  INP   LOCATION:  [id number]                         FACILITY:  [facility]   PHYSICIAN:  Wonda Horner, M.D.   DATE OF BIRTH:  09/22/1935   DATE OF CONSULTATION:  12/27/2005  DATE OF DISCHARGE:                                   CONSULTATION   REASON FOR CONSULTATION:  The patient is a 76 year old female with a history  of chronic ulcerative colitis.  She has been under the care of Dr. Herbie Baltimore  Buccini for her ulcerative colitis for about eight years.  The patient has  been on maintenance therapy for her ulcerative colitis with Asacol four  capsules t.i.d. and __________ 7.5 mg daily.  Other medications include  Nexium, Estradiol, Phenergan, Diltiazem, Tranxene, calcium, and Percocet.  The patient saw Dr. [name] in the office last week and was scheduled for a  colonoscopy next week.   The reason for her admission is that she has been experiencing abdominal  bloating and lower abdominal pain recently and it has increased.  She does  have chronic diarrhea but this has also increased to about 10 or 12 stools a  day.  She does not see blood in the stool.  She came to the emergency room  where a CT scan was done of the abdomen.  This showed extensive  diverticulosis of the descending colon and a suggestion of possible mild  diverticulitis.   PAST HISTORY:  1.  GERD.  2.  Peptic stricture of the esophagus.  3.  Ulcerative colitis.  4.  Hypertension.  5.  Diverticulosis.  6.  Shingles.  7.  Anxiety.   PRIOR SURGERIES:  1.  Hysterectomy.  2.  Salpingo-oophorectomy.  3.  Laparoscopy.  4.  Hemorrhoidectomy.   ALLERGIES:  1.  FLAGYL.  2.  VIOXX.   REVIEW OF SYSTEMS:  She is not complaining of chest pain or shortness of  breath.  She has recently noticed difficulty in her urination with being  unable to urinate at times.   PHYSICAL EXAMINATION:  VITAL SIGNS:  Stable.  GENERAL:  She is in no distress.  HEENT:  Non-icteric.  HEART:  Regular rhythm.  No murmurs.  LUNGS:  Clear.  ABDOMEN:  Bowel sounds are present.  It is soft.  There is some mild  tenderness in the left lower quadrant and also suprapubic area.   IMPRESSION:  1.  Chronic ulcerative colitis.  The patient has been on maintenance therapy      for this.  2.  Probable diverticulitis based on new change in clinical symptoms and CT      scan findings.   PLAN:  Continue maintenance therapy for the ulcerative colitis.  Antibiotics  with Cipro for treatment of presumed diverticulitis.  At this time I would  defer a colonoscopy until the symptoms of diverticulitis have resolved.           ______________________________  Wonda Horner, M.D.     SFG/MEDQ  D:  12/27/2005  T:  12/27/2005  Job:  [id number]   cc:  Geronimo Boot, M.D.   [name], M.D.  Fax: [phone]

## 2011-04-25 NOTE — Discharge Summary (Signed)
Heather [name], Heather [name]   MEDICAL RECORD NO.:  [id number]          PATIENT TYPE:  INP   LOCATION:  [id number]                         FACILITY:  [facility]   PHYSICIAN:  Sherryl Manges, M.D.  DATE OF BIRTH:  1935/04/02   DATE OF ADMISSION:  12/26/2005  DATE OF DISCHARGE:  01/02/2006                                 DISCHARGE SUMMARY   ADDENDUM:  For discharge diagnoses, discharge medications, procedures,  consultations, and admission history/detailed clinical course, refer to  interim discharge summary dated December 30, 2005, in addition, for the  period from December 31, 2005 to January 02, 2006.  The patient's clinical  condition remains quite stable.  She did have some problems with blood  pressure control, necessitating changing her Cardizem from the short-acting  version to Cardizem CD 240 mg p.o. daily, with improved blood pressure  control.  We expect her PMD, Dr. Anastasio Champion to follow up with this, and adjust  medications accordingly.  The patient's only other issues were nausea on  December 31, 2005 and January 01, 2006, which responded to antiemetics, i.e.,  Phenergan given p.o.  She had some loose bowel movements on January 25, 207,  after drinking orange juice and eating some cabbage with it, but this  apparently has resolved.  On January 02, 2006, the patient was completely  asymptomatic and very keen to go home.  It is felt that she is somewhat  deconditioned because of her recent illness.  Physical therapy/occupational  therapy consultation was called, and it was recommended that the patient  would benefit from home health aide, home health RN, PT/OT, and the  utilization of a rolling walker.  The patient appears satisfied with this  arrangement.  She has been discharged therefore, in satisfactory condition  on January 02, 2006.  For follow up instructions, refer to discharge summary  dated December 30, 2005.  She is expected to follow up with her PMD,  Dr.  [name], in approximately 1 week.  She is also to follow up with her  gastroenterologist, Dr. [name], per prior scheduled appointment.      Sherryl Manges, M.D.  Electronically Signed     CO/MEDQ  D:  01/02/2006  T:  01/02/2006  Job:  [id number]

## 2011-04-25 NOTE — H&P (Signed)
Heather [name], Heather [name]   MEDICAL RECORD NO.:  [id number]          PATIENT TYPE:  INP   LOCATION:  38                         FACILITY:  [facility]   PHYSICIAN:  Sherryl Manges, M.D.  DATE OF BIRTH:  May 09, 1935   DATE OF ADMISSION:  12/26/2005  DATE OF DISCHARGE:                                HISTORY & PHYSICAL   PRIMARY CARE PHYSICIAN:  Doree Albee, M.D.  Gastroenterologist, [name], M.D.   CHIEF COMPLAINT:  Abdominal pain and diarrhea.   HISTORY OF PRESENT ILLNESS:  This is a 76 year old female. For past medical  history, see below.  known case of ulcerative colitis, under the care of Dr.  [name] for the past 8 years.  Over the past 1 week, she has had diffuse  abdominal pain, maximal in both lower quadrants.  Also, has had increased  stool frequency with about 10-12 stools per day, watery, no blood.  She  denies vomiting.  She is feeling increasingly weak.   PAST MEDICAL HISTORY:  1.  GERD/Peptic stricture of esophagus, status post dilatation in the past.  2.  Ulcerative colitis, under the care of Dr. [name].  3.  Hypertension.  4.  Diverticulosis.  5.  Status post hysterectomy and bilateral salpingo-oophorectomy.  6.  Status post diagnostic laparoscopy/surgery in January 2006.  7.  Remote history of hemorrhoid surgery.  8.  Shingles in October 2006.  9.  Anxiety.   MEDICATIONS:  1.  Nexium 40 mg p.o. daily.  2.  Purinethol 7.5 mg daily.  3.  Estradiol 1.5 mg p.o. daily.  4.  Phenergan 25 mg p.o. p.r.n.  5.  Diltiazem 6 mg p.o. b.i.d.  6.  Tranxene 7.5 mg p.o. t.i.d. p.r.n.  7.  Asacol 400 mg. Four capsules p.o. t.i.d.  8.  Halcion 0.25 mg one to two pills p.o. p.r.n. nightly.  9.  Percocet 1 p.o. p.r.n.   ALLERGIES:  FLAGYL/VIOXX both causing a rash.   REVIEW OF SYSTEMS:  CARDIOPULMONARY:  Denies any shortness of breath.  GENITOURINARY:  Had difficulty passing urine on December 26, 2005.  Review of  systems  otherwise negative.   SOCIAL HISTORY:  The patient lives with a female friend, who is elderly.  She  gets [employer].  She ambulates without a cane, but is finding it  increasingly difficult to cope.  She is a nonsmoker, nondrinker.  She has no  history of drug abuse.   FAMILY HISTORY:  She has one son that is alive and well.  She had a strong  family history of cancer in one sister and both parents.  Her family history  is otherwise noncontributory.   PHYSICAL EXAMINATION:  VITAL SIGNS:  Temperature 98.7, pulse 85 per minute,  respirations 20, BP 199/80 mmHg, rechecked 155/74 mmHg, pulse oximetry 98%  on room air.  GENERAL:  The patient is alert, comfortable, communicative, not short of  breath at rest, in no obvious acute distress.  HEENT:  No clinical pallor, no jaundice, no conjunctival injection.  Throat  appears quite clear.  NECK:  Supple, JVP not seen.  No palpable  lymphadenopathy.  No palpable  goiter.  CHEST:  Clear to auscultation with no wheezes or crackles.  HEART:  S1, S2 heard, normal, regular.  No murmurs.  ABDOMEN:  Soft.  There is mild tenderness in the left lower quadrant.  No  guarding.  Bowel sounds are brisk.  EXTREMITIES:  Lower extremity examination with no pitting edema.  Palpable  peripheral pulses.  MUSCULOSKELETAL:  System was not formally examined, however, osteoarthritic  changes are noted.  NEUROLOGIC:  No neurological deficits on gross examination.   LABORATORY DATA AND X-RAY FINDINGS:  CBC with WBC 3.1, hemoglobin 11.4,  hematocrit 33.1, platelets 380.  Electrolytes with sodium 138, potassium  3.1, chloride 105, CO2 25, BUN 5, creatinine 0.7, glucose 128.  LFTs normal.  Urinalysis negative.   Abdominal/pelvic CT scan dated December 26, 2005, shows extensive colonic  diverticulosis, possible minimal diverticulitis, possible left renal  calculus vs vascular calcification.  The patient is status post  hysterectomy.   ASSESSMENT/PLAN:  1.   Ulcerative colitis with exacerbation.  We will admit the patient and      institute bowel rest, intravenous fluid hydration, ciprofloxacin and      steroids.  The patient was due to see Dr. [name] on December 27, 2005, for possible colonoscopy.  We will consult him accordingly.      Meanwhile, will send stools for Clostridium difficile toxin, fecal      leukocytes and microscopy.   1.  Hypertension, uncontrolled.  We shall institute Catapres TTS patch for      now.   1.  History of gastroesophageal reflux disease.  The patient has been placed      on proton pump inhibitor treatment.   1.  Possible diverticulitis.  This should be adequately covered by      ciprofloxacin.  Allergy to Flagyl as noted.   1.  Anxiety.  Continue pre-admission medications.  Further management will depend on clinical course.      Sherryl Manges, M.D.  Electronically Signed     CO/MEDQ  D:  12/27/2005  T:  12/27/2005  Job:  [id number]   cc:   Doree Albee, M.D.  Fax: [phone]   [name], M.D.  Fax: [phone]

## 2011-04-25 NOTE — Consult Note (Signed)
Heather Heather [name], Heather [name], M.D.DATE OF BIRTH:  Jun 25, 1935   DATE OF CONSULTATION:  11/22/2005  DATE OF DISCHARGE:                                   CONSULTATION   CONSULTING PHYSICIAN:  Cherene Altes, [name], emergency room department physician.   REASON FOR CONSULTATION:  Abdominal pain.   HISTORY OF PRESENT ILLNESS:  Heather Heather [name] is a 76 year old female  with a history of ulcerative colitis who had the abrupt onset last night at  2 in the morning of severe watery diarrhea. This was associated with severe  diffuse abdominal cramps.  Between the hours of 2 a.m. and 8 a.m., she had 7-  8 watery stools with very little p.o. intake.  She reports a subjective  fever.  There was no shortness of breath or diaphoresis.  The patient took  Vicodin which she had been given for other pain and this did help some with  the abdominal pain.  Symptoms worsened as the morning progressed, however,  and her son suggested that she should present to the emergency room for  evaluation.  She was further prompted to do so after she began to develop  some distention in the abdomen.  The patient called her GI physician who did  support the idea of her presenting to the ER for evaluation.   In the ER, the patient had a KUB which revealed no evidence of obstruction.  She was treated with pain medication.  She has had no more diarrhea during  her ER stay.  Her abdominal cramps are much improved at this time.  She is  currently tolerating p.o. intake.   REVIEW OF SYSTEMS:  Comprehensive review of systems is without significant  positive findings except for those noted in the history of present illness  above.   PAST MEDICAL HISTORY:  1.  Gastroesophageal reflux disease  with esophageal stricture, status post      dilatation.  2.  Status post hysterectomy.  3.  Status post oophorectomy.  4.  Status post diagnostic laparoscopy with bilateral salpingo-oophorectomy      and lysis of adhesions, January 2006.  5.  Ulcerative colitis.  6.  Status post hemorrhoidectomy, approximately 12 years ago.  7.  Prior history of tobacco abuse in the amount of one half to one pack per      day, discontinued one year ago.  8.  Diverticulosis via colonoscopy.  9.  Hypertension.  10. Recent shingles, October 2006.  11. Anxiety disorder.   MEDICATIONS:  1.  NuLev 1-2 p.r.n. q.4h.  2.  Nexium 40 mg every day.  3.  Halcion 1-2 p.o. q.h.s. p.r.n.  4.  Tranxene 7.5 mg t.i.d. p.r.n.  5.  Purinethol 75 mg every day.  6.  Estrogen 1.25 mg every day.  7.  Phenergan 25 mg p.r.n.  8.  Diltiazem 60 mg p.o. b.i.d.  9.  ___________ p.o.  t.i.d.  10. Vicodin p.r.n. pain.   ALLERGIES:  FLAGYL.   FAMILY HISTORY:  Noncontributory to this visit.   SOCIAL HISTORY:  The patient lives in [place].  She has a family friend  who lives with her.  She is a widow.  She has one son.  She does not drink  alcohol.   DATA REVIEW:  Hemoglobin is low at 11.6 with an MCV of 105, white count is  normal, platelets are 485.  Potassium is low at 2.9.  Electrolytes are  otherwise balanced.  BUN is 6, creatinine is 0.7, serum glucose is normal.  LFTs are normal.  Albumin is 2.9.  Lipase is 24.  Urinalysis is negative.  A  12-lead EKG reveals a normal sinus rhythm with no acute changes.  KUB  reveals no nonspecific, nonobstructive bowel gas pattern and chest x-ray  reveals no acute disease and there is no free air in the diaphragm.   PHYSICAL EXAMINATION:  VITAL SIGNS:  Temperature 97.9, blood pressure  200/85, heart rate 104, respiratory rate 22, O2 sat 97% on room air.  GENERAL:  Well-developed, well-nourished female in no acute respiratory  distress.  HEENT:  Normocephalic, atraumatic.  Pupils  equal, round, reactive to light  and accommodation.  Extraocular muscles intact bilaterally.  OC/OP clear.  NECK:  No JVD.  No lymphadenopathy.  LUNGS:  Clear to auscultation bilaterally without wheezes or rhonchi.  CARDIOVASCULAR:  Regular rate and rhythm without murmur, gallop or rub.  Normal S1 and S2.  ABDOMEN:  Mildly distended, tympanic.  Bowel sounds positive.  Mildly tender  to deep palpation throughout.  No rebound.  No abdominal wall edema.  No  erythema.  No Murphy sign.  EXTREMITIES:  No significant cyanosis, clubbing, edema bilateral lower  extremities.  NEUROLOGIC:  Alert and oriented x4.  Cranial nerves II-XII intact  bilaterally.  Strength 5/5 bilateral upper and lower extremities.  Intact  sensation to touch throughout.   ASSESSMENT/RECOMMENDATIONS:  1.  Hypokalemia.  The patient's hypokalemia is likely due to her multiple      episodes of perfuse watery diarrhea this morning.  She admits to needing      potassium replacement in the past because of this problem.  She has been      given one 10 mEq dose of potassium chloride intravenously in the      emergency room.  I recommend dosing her with an additional 40 mEq orally      at this time.  She says that she can tolerate this and has done this      before.  I am additionally placing her on K-Dur 20 mEq tablets two      tablets a day for ten days empirically to ensure that her potassium      improves to a normal range.  EKG is without evidence of changes one      would expect with severe hypokalemia.  2.  Abdominal pain.  I feel that the patient is most likely having cramping      and is trapping gas related to her ulcerative colitis bout.  She has      been seen by her gastroenterologist who confirms that she does not have      a mega colon.  I did not see an indication of remission simply based on      this as the patient has responded well to pain control in the emergency     room.  I will  provide her with Percocet  for home use for pain control.      I have advised her to follow up her gastroenterologist or her primary      care doctor in seven days for recheck of her abdomen and recheck of her      potassium level as well.  3.  Uncontrolled hypertension.  The patient has markedly elevated blood      pressure in the emergency room but is asymptomatic from such.  I feel      that this is a normal physiologic response to pain and anxiety.  I have      chosen      to not initiate any additional medications.  The patient is instructed      to resume her usual medications at home once she returns ____________.   DISPOSITION:  I have cleared the patient for discharge from the emergency  room.  ____________.      Cherene Altes, M.D.  Electronically Signed     JTM/MEDQ  D:  11/22/2005  T:  11/23/2005  Job:  [id number]   cc:   Doree Albee, M.D.  Fax: [phone]   [name], M.D.  Fax: [phone]

## 2011-05-09 ENCOUNTER — Ambulatory Visit: Payer: Medicare Other | Admitting: Internal Medicine

## 2011-05-16 ENCOUNTER — Ambulatory Visit (INDEPENDENT_AMBULATORY_CARE_PROVIDER_SITE_OTHER): Payer: Medicare Other | Admitting: Internal Medicine

## 2011-05-16 ENCOUNTER — Encounter: Payer: Self-pay | Admitting: Internal Medicine

## 2011-05-16 DIAGNOSIS — R600 Localized edema: Secondary | ICD-10-CM

## 2011-05-16 DIAGNOSIS — M545 Low back pain, unspecified: Secondary | ICD-10-CM

## 2011-05-16 DIAGNOSIS — F411 Generalized anxiety disorder: Secondary | ICD-10-CM

## 2011-05-16 DIAGNOSIS — R609 Edema, unspecified: Secondary | ICD-10-CM

## 2011-05-16 DIAGNOSIS — E785 Hyperlipidemia, unspecified: Secondary | ICD-10-CM

## 2011-05-16 DIAGNOSIS — F329 Major depressive disorder, single episode, unspecified: Secondary | ICD-10-CM

## 2011-05-16 NOTE — Progress Notes (Signed)
  Subjective:    Patient ID: Heather [name], female    DOB: 08/19/1935, 76 y.o.   MRN: [id number]  HPI  76 year old patient who is seen today for followup. She has a history of treated hypertension which has been quite stable she is presently on diltiazem 240 mg daily she has had some pedal edema that has been better controlled. She does use furosemide when necessary but not very often. She has a history of anxiety depression which has done remarkably well on sertraline. She is also on Lyrica for chronic low back pain. She has hypothyroidism gastroesophageal reflux disease. She is followed by chronic pain management.    Review of Systems  Constitutional: Negative.   HENT: Negative for hearing loss, congestion, sore throat, rhinorrhea, dental problem, sinus pressure and tinnitus.   Eyes: Negative for pain, discharge and visual disturbance.  Respiratory: Negative for cough and shortness of breath.   Cardiovascular: Negative for chest pain, palpitations and leg swelling.  Gastrointestinal: Negative for nausea, vomiting, abdominal pain, diarrhea, constipation, blood in stool and abdominal distention.  Genitourinary: Negative for dysuria, urgency, frequency, hematuria, flank pain, vaginal bleeding, vaginal discharge, difficulty urinating, vaginal pain and pelvic pain.  Musculoskeletal: Positive for back pain. Negative for joint swelling, arthralgias and gait problem.  Skin: Negative for rash.  Neurological: Negative for dizziness, syncope, speech difficulty, weakness, numbness and headaches.  Hematological: Negative for adenopathy.  Psychiatric/Behavioral: Negative for behavioral problems, dysphoric mood and agitation. The patient is not nervous/anxious.        Objective:   Physical Exam  Constitutional: She is oriented to person, place, and time. She appears well-developed and well-nourished.       Alert appropriate bright affect Blood pressure nicely controlled  HENT:  Head:  Normocephalic.  Right Ear: External ear normal.  Left Ear: External ear normal.  Mouth/Throat: Oropharynx is clear and moist.  Eyes: Conjunctivae and EOM are normal. Pupils are equal, round, and reactive to light.  Neck: Normal range of motion. Neck supple. No thyromegaly present.  Cardiovascular: Normal rate, regular rhythm, normal heart sounds and intact distal pulses.   Pulmonary/Chest: Effort normal and breath sounds normal.  Abdominal: Soft. Bowel sounds are normal. She exhibits no mass. There is no tenderness.  Musculoskeletal: Normal range of motion.  Lymphadenopathy:    She has no cervical adenopathy.  Neurological: She is alert and oriented to person, place, and time.  Skin: Skin is warm and dry. No rash noted.  Psychiatric: She has a normal mood and affect. Her behavior is normal.          Assessment & Plan:   Hypertension well controlled Anxiety depression stable Chronic low back pain stable Gastroesophageal reflux disease Dyslipidemia well controlled on Lipitor  Low salt diet encouraged We'll recheck in 4 months

## 2011-05-16 NOTE — Patient Instructions (Signed)
Limit your sodium (Salt) intake  Take a calcium supplement, plus [phone] units of vitamin D  Return in 4 months for follow-up

## 2011-06-21 ENCOUNTER — Other Ambulatory Visit: Payer: Self-pay | Admitting: Internal Medicine

## 2011-06-26 ENCOUNTER — Other Ambulatory Visit: Payer: Self-pay

## 2011-06-26 MED ORDER — CLORAZEPATE DIPOTASSIUM 7.5 MG PO TABS: 7.5000 mg | ORAL_TABLET | Freq: Two times a day (BID) | ORAL | Status: DC | PRN

## 2011-06-26 NOTE — Telephone Encounter (Signed)
Faxed back to [facility]. kik

## 2011-07-25 ENCOUNTER — Other Ambulatory Visit: Payer: Self-pay

## 2011-07-25 MED ORDER — TRIAZOLAM 0.25 MG PO TABS: 0.2500 mg | ORAL_TABLET | Freq: Every evening | ORAL | Status: DC | PRN

## 2011-07-25 NOTE — Telephone Encounter (Signed)
Faxed back to [facility]

## 2011-08-04 ENCOUNTER — Other Ambulatory Visit: Payer: Self-pay

## 2011-08-04 MED ORDER — PREGABALIN 50 MG PO CAPS: 50.0000 mg | ORAL_CAPSULE | Freq: Two times a day (BID) | ORAL | Status: DC

## 2011-08-04 NOTE — Telephone Encounter (Signed)
Faxed back to [facility]

## 2011-09-15 ENCOUNTER — Ambulatory Visit: Payer: Medicare Other | Admitting: Internal Medicine

## 2011-09-22 ENCOUNTER — Encounter: Payer: Self-pay | Admitting: Internal Medicine

## 2011-09-22 ENCOUNTER — Ambulatory Visit (INDEPENDENT_AMBULATORY_CARE_PROVIDER_SITE_OTHER): Payer: Medicare Other | Admitting: Internal Medicine

## 2011-09-22 VITALS — BP 114/70 | Temp 97.4°F | Wt 123.0 lb

## 2011-09-22 DIAGNOSIS — M545 Low back pain, unspecified: Secondary | ICD-10-CM

## 2011-09-22 DIAGNOSIS — R209 Unspecified disturbances of skin sensation: Secondary | ICD-10-CM

## 2011-09-22 DIAGNOSIS — F329 Major depressive disorder, single episode, unspecified: Secondary | ICD-10-CM

## 2011-09-22 DIAGNOSIS — Z23 Encounter for immunization: Secondary | ICD-10-CM

## 2011-09-22 DIAGNOSIS — Z0000 Encounter for general adult medical examination without abnormal findings: Secondary | ICD-10-CM

## 2011-09-22 DIAGNOSIS — I10 Essential (primary) hypertension: Secondary | ICD-10-CM

## 2011-09-22 MED ORDER — PREGABALIN 50 MG PO CAPS: 50.0000 mg | ORAL_CAPSULE | Freq: Three times a day (TID) | ORAL | Status: DC

## 2011-09-22 NOTE — Patient Instructions (Signed)
Limit your sodium (Salt) intake    It is important that you exercise regularly, at least 20 minutes 3 to 4 times per week.  If you develop chest pain or shortness of breath seek  medical attention.  Return in 6 months for follow-up  

## 2011-09-22 NOTE — Progress Notes (Signed)
  Subjective:    Patient ID: Heather [name], female    DOB: 08-17-1935, 76 y.o.   MRN: [id number]  HPI  76 year old patient who is seen today for followup. She is followed by chronic pain management for chronic low back pain and paresthesias. She states that she has done quite well and only has modest pain from the hip to the knee area. She describes this as a burning dysesthesia. She states that she discontinued all analgesics on June 11. She presently is on Lyrica 50 mg 3 times daily and feels quite well on this regimen. She is treated hypertension which has done well on diltiazem. She has a history of anxiety depression as well as dyslipidemia. She remains on Lipitor 20 mg daily.    Review of Systems  Constitutional: Negative.   HENT: Negative for hearing loss, congestion, sore throat, rhinorrhea, dental problem, sinus pressure and tinnitus.   Eyes: Negative for pain, discharge and visual disturbance.  Respiratory: Negative for cough and shortness of breath.   Cardiovascular: Negative for chest pain, palpitations and leg swelling.  Gastrointestinal: Negative for nausea, vomiting, abdominal pain, diarrhea, constipation, blood in stool and abdominal distention.  Genitourinary: Negative for dysuria, urgency, frequency, hematuria, flank pain, vaginal bleeding, vaginal discharge, difficulty urinating, vaginal pain and pelvic pain.  Musculoskeletal: Positive for back pain and arthralgias. Negative for joint swelling and gait problem.  Skin: Negative for rash.  Neurological: Positive for numbness. Negative for dizziness, syncope, speech difficulty, weakness and headaches.  Hematological: Negative for adenopathy.  Psychiatric/Behavioral: Negative for behavioral problems, dysphoric mood and agitation. The patient is not nervous/anxious.        Objective:   Physical Exam  Constitutional: She is oriented to person, place, and time. She appears well-developed and well-nourished.  HENT:  Head:  Normocephalic.  Right Ear: External ear normal.  Left Ear: External ear normal.  Mouth/Throat: Oropharynx is clear and moist.  Eyes: Conjunctivae and EOM are normal. Pupils are equal, round, and reactive to light.  Neck: Normal range of motion. Neck supple. No thyromegaly present.  Cardiovascular: Normal rate, regular rhythm, normal heart sounds and intact distal pulses.   Pulmonary/Chest: Effort normal and breath sounds normal.  Abdominal: Soft. Bowel sounds are normal. She exhibits no mass. There is no tenderness.  Musculoskeletal: Normal range of motion.  Lymphadenopathy:    She has no cervical adenopathy.  Neurological: She is alert and oriented to person, place, and time.  Skin: Skin is warm and dry. No rash noted.  Psychiatric: She has a normal mood and affect. Her behavior is normal.          Assessment & Plan:   Chronic back pain. This is quite stable on Lyrica along without the narcotic analgesics. We'll continue her present regimen Hypertension well controlled Anxiety depression stable  We'll see in 6 months for her annual physical

## 2011-10-23 ENCOUNTER — Other Ambulatory Visit: Payer: Self-pay | Admitting: Internal Medicine

## 2011-11-28 ENCOUNTER — Other Ambulatory Visit: Payer: Self-pay

## 2011-11-28 ENCOUNTER — Other Ambulatory Visit: Payer: Self-pay | Admitting: Internal Medicine

## 2011-11-28 NOTE — Telephone Encounter (Signed)
Last OV 09/22/11. Last filled 08/26/11

## 2011-11-28 NOTE — Telephone Encounter (Signed)
ok 

## 2011-12-01 MED ORDER — CLORAZEPATE DIPOTASSIUM 7.5 MG PO TABS: 7.5000 mg | ORAL_TABLET | Freq: Two times a day (BID) | ORAL | Status: DC | PRN

## 2011-12-01 NOTE — Telephone Encounter (Signed)
Called in.

## 2011-12-10 ENCOUNTER — Other Ambulatory Visit: Payer: Self-pay

## 2011-12-10 MED ORDER — TRIAZOLAM 0.25 MG PO TABS: 0.2500 mg | ORAL_TABLET | Freq: Every evening | ORAL | Status: DC | PRN

## 2012-01-05 ENCOUNTER — Other Ambulatory Visit: Payer: Self-pay | Admitting: Internal Medicine

## 2012-01-30 ENCOUNTER — Other Ambulatory Visit: Payer: Self-pay | Admitting: Internal Medicine

## 2012-02-20 ENCOUNTER — Other Ambulatory Visit: Payer: Self-pay | Admitting: Internal Medicine

## 2012-02-25 ENCOUNTER — Other Ambulatory Visit: Payer: Self-pay

## 2012-02-25 MED ORDER — PREGABALIN 50 MG PO CAPS: 50.0000 mg | ORAL_CAPSULE | Freq: Three times a day (TID) | ORAL | Status: DC

## 2012-03-02 ENCOUNTER — Other Ambulatory Visit: Payer: Self-pay

## 2012-03-02 MED ORDER — TRIAZOLAM 0.25 MG PO TABS: 0.2500 mg | ORAL_TABLET | Freq: Every evening | ORAL | Status: DC | PRN

## 2012-03-15 ENCOUNTER — Other Ambulatory Visit: Payer: Self-pay | Admitting: Internal Medicine

## 2012-03-23 ENCOUNTER — Encounter: Payer: Self-pay | Admitting: Internal Medicine

## 2012-03-23 ENCOUNTER — Ambulatory Visit (INDEPENDENT_AMBULATORY_CARE_PROVIDER_SITE_OTHER): Payer: Medicare Other | Admitting: Internal Medicine

## 2012-03-23 VITALS — BP 122/80 | HR 72 | Temp 97.7°F | Resp 18 | Ht <= 58 in | Wt 124.0 lb

## 2012-03-23 DIAGNOSIS — K5730 Diverticulosis of large intestine without perforation or abscess without bleeding: Secondary | ICD-10-CM | POA: Diagnosis not present

## 2012-03-23 DIAGNOSIS — E039 Hypothyroidism, unspecified: Secondary | ICD-10-CM

## 2012-03-23 DIAGNOSIS — R209 Unspecified disturbances of skin sensation: Secondary | ICD-10-CM | POA: Diagnosis not present

## 2012-03-23 DIAGNOSIS — Z0000 Encounter for general adult medical examination without abnormal findings: Secondary | ICD-10-CM | POA: Diagnosis not present

## 2012-03-23 DIAGNOSIS — I10 Essential (primary) hypertension: Secondary | ICD-10-CM | POA: Diagnosis not present

## 2012-03-23 DIAGNOSIS — E785 Hyperlipidemia, unspecified: Secondary | ICD-10-CM

## 2012-03-23 DIAGNOSIS — K219 Gastro-esophageal reflux disease without esophagitis: Secondary | ICD-10-CM | POA: Diagnosis not present

## 2012-03-23 LAB — LIPID PANEL
Cholesterol: 203 mg/dL — ABNORMAL HIGH (ref 0–200)
HDL: 122.9 mg/dL (ref >39.00)
Total CHOL/HDL Ratio: 2
Triglycerides: 128 mg/dL (ref 0.0–149.0)
VLDL: 25.6 mg/dL (ref 0.0–40.0)

## 2012-03-23 LAB — CBC WITH DIFFERENTIAL/PLATELET
Basophils Absolute: 0 10*3/uL (ref 0.0–0.1)
Basophils Relative: 0.2 % (ref 0.0–3.0)
Eosinophils Absolute: 0.1 10*3/uL (ref 0.0–0.7)
Eosinophils Relative: 0.8 % (ref 0.0–5.0)
HCT: 34.2 % — ABNORMAL LOW (ref 36.0–46.0)
Hemoglobin: 11 g/dL — ABNORMAL LOW (ref 12.0–15.0)
Lymphocytes Relative: 23.8 % (ref 12.0–46.0)
Lymphs Abs: 2.3 10*3/uL (ref 0.7–4.0)
MCHC: 32.2 g/dL (ref 30.0–36.0)
MCV: 84.1 fl (ref 78.0–100.0)
Monocytes Absolute: 0.6 10*3/uL (ref 0.1–1.0)
Monocytes Relative: 6.1 % (ref 3.0–12.0)
Neutro Abs: 6.6 10*3/uL (ref 1.4–7.7)
Neutrophils Relative %: 69.1 % (ref 43.0–77.0)
Platelets: 373 10*3/uL (ref 150.0–400.0)
RBC: 4.07 Mil/uL (ref 3.87–5.11)
RDW: 16.5 % — ABNORMAL HIGH (ref 11.5–14.6)
WBC: 9.6 10*3/uL (ref 4.5–10.5)

## 2012-03-23 LAB — COMPREHENSIVE METABOLIC PANEL
ALT: 10 U/L (ref 0–35)
AST: 18 U/L (ref 0–37)
Albumin: 4 g/dL (ref 3.5–5.2)
Alkaline Phosphatase: 89 U/L (ref 39–117)
BUN: 11 mg/dL (ref 6–23)
CO2: 27 mEq/L (ref 19–32)
Calcium: 9.1 mg/dL (ref 8.4–10.5)
Chloride: 104 mEq/L (ref 96–112)
Creatinine, Ser: 0.8 mg/dL (ref 0.4–1.2)
GFR: 74.37 mL/min (ref >60.00)
Glucose, Bld: 101 mg/dL — ABNORMAL HIGH (ref 70–99)
Potassium: 3.6 mEq/L (ref 3.5–5.1)
Sodium: 141 mEq/L (ref 135–145)
Total Bilirubin: 0.3 mg/dL (ref 0.3–1.2)
Total Protein: 7.6 g/dL (ref 6.0–8.3)

## 2012-03-23 LAB — TSH: TSH: 1.09 u[IU]/mL (ref 0.35–5.50)

## 2012-03-23 LAB — LDL CHOLESTEROL, DIRECT: Direct LDL: 52.3 mg/dL

## 2012-03-23 MED ORDER — LEVOTHYROXINE SODIUM 25 MCG PO TABS: 25.0000 ug | ORAL_TABLET | Freq: Every day | ORAL | Status: DC

## 2012-03-23 MED ORDER — ATORVASTATIN CALCIUM 20 MG PO TABS: 20.0000 mg | ORAL_TABLET | Freq: Every day | ORAL | Status: DC

## 2012-03-23 MED ORDER — TRIAZOLAM 0.25 MG PO TABS: 0.2500 mg | ORAL_TABLET | Freq: Every evening | ORAL | Status: DC | PRN

## 2012-03-23 MED ORDER — ESOMEPRAZOLE MAGNESIUM 40 MG PO CPDR: 40.0000 mg | DELAYED_RELEASE_CAPSULE | Freq: Every day | ORAL | Status: DC

## 2012-03-23 MED ORDER — DILTIAZEM HCL ER COATED BEADS 240 MG PO CP24: 240.0000 mg | ORAL_CAPSULE | Freq: Every day | ORAL | Status: DC

## 2012-03-23 MED ORDER — SERTRALINE HCL 25 MG PO TABS: 25.0000 mg | ORAL_TABLET | Freq: Every day | ORAL | Status: DC

## 2012-03-23 MED ORDER — CLORAZEPATE DIPOTASSIUM 7.5 MG PO TABS: 7.5000 mg | ORAL_TABLET | Freq: Two times a day (BID) | ORAL | Status: DC | PRN

## 2012-03-23 MED ORDER — PREGABALIN 50 MG PO CAPS: 50.0000 mg | ORAL_CAPSULE | Freq: Three times a day (TID) | ORAL | Status: DC

## 2012-03-23 NOTE — Progress Notes (Signed)
Subjective:    Patient ID: Heather [name], female    DOB: October 20, 1935, 77 y.o.   MRN: [id number]  HPI  History of Present Illness:   3 -year-old patient who is seen today for a wellness exam. She has a history of depression. Lower leg paresthesias, chronic low back pain. She has been given a diagnosis in the past of fibromyalgia. She is on Lyrica. Allergies include Cymbalta. She has hypertension and dyslipidemia.   Here for Medicare AWV:   1. Risk factors based on Past M, S, F history: Her vascular risk factors include hypertension, and dyslipidemia.  2. Physical Activities: fairly sedentary limited somewhat by back and hip pain  3. Depression/mood: on history depression  4. Hearing: no deficits  5. ADL's: independent in all aspects of daily living  6. Fall Risk: moderate due to age and hip pain  7. Home Safety: no problems identified  8. Height, weight, &visual acuity:height and weight stable. No change in visual acuity  9. Counseling: heart healthy diet restricted salt diet. All encouraged  10. Labs ordered based on risk factors: return profile, including lipid panel will be reviewed. Electrolytes will be monitored  11. Referral Coordination- annual mammogram. Encouraged will consider orthopedic referral  12. Care Plan- heart healthy diet more regular exercise. Encouraged  13. Cognitive Assessment- alert and oriented, with normal affect. No history of cognitive impairment or memory disturbance   Allergies:  1) ! Cymbalta (Duloxetine Hcl)  2) ! Vioxx  3) ! Metronidazole (Metronidazole)   Past History:  Past Medical History:  Reviewed history from 10/01/2009 and no changes required.  Depression  Hyperlipidemia  Hypertension  chronic pain syndrome  paresthesias of the legs  GERD  Hypothyroidism  Anxiety  insomnia  menopausal syndrome  Diverticulosis, colon  IBS  Headache  history of acute urinary retention  chronic constipation  history of Crohn's colitis    shingles 10/06  IGT   Past Surgical History:  Reviewed history from 10/01/2009 and no changes required.  colonoscopy 2008 (Buccini) Appendectomy  Hysterectomy age 54  Tonsillectomy  abdominal pelvic CT December 2008   Family History:  Reviewed history from 07/23/2009 and no changes required.  father died age 53-history of R. A. TB, died of cancer, unclear type  mother died mid 61s cancer, unclear type  Four sisters no brothers-positive for breast cancer, hypertension, dyslipidemia, end-stage COPD   Social History:  Reviewed history from 07/23/2009 and no changes required.  Retired  Widow/Widower  Never Smoked   Past Medical History  Diagnosis Date  . ANXIETY 07/23/2009  . DEPRESSION 07/23/2009  . DIVERTICULOSIS, COLON 07/23/2009  . GERD 07/23/2009  . Headache 07/23/2009  . HIP PAIN, LEFT 05/16/2010  . HYPERLIPIDEMIA 07/23/2009  . HYPERTENSION 07/23/2009  . HYPOTHYROIDISM 07/23/2009  . LOW BACK PAIN, CHRONIC 10/01/2009  . PARESTHESIA 10/01/2009  . IBS (irritable bowel syndrome)   . History of Crohn's disease   . Chronic pain   . Hypothyroidism   . Insomnia     History   Social History  . Marital Status: Widowed    Spouse Name: N/A    Number of Children: N/A  . Years of Education: N/A   Occupational History  . Not on file.   Social History Main Topics  . Smoking status: Never Smoker   . Smokeless tobacco: Never Used  . Alcohol Use: No  . Drug Use: No  . Sexually Active: Not on file   Other Topics Concern  . Not on file  Social History Narrative  . No narrative on file    Past Surgical History  Procedure Date  . Appendectomy   . Abdominal hysterectomy   . Tonsillectomy     No family history on file.  Allergies  Allergen Reactions  . Duloxetine   . Metronidazole   . Rofecoxib     Current Outpatient Prescriptions on File Prior to Visit  Medication Sig Dispense Refill  . atorvastatin (LIPITOR) 20 MG tablet TAKE 1 TABLET DAILY  90 tablet  3  .  clorazepate (TRANXENE) 7.5 MG tablet Take 1 tablet (7.5 mg total) by mouth 2 (two) times daily as needed.  90 tablet  0  . estropipate (OGEN) 1.5 MG tablet TAKE 1 TABLET EVERY MORNING  90 tablet  0  . levothyroxine (SYNTHROID, LEVOTHROID) 25 MCG tablet TAKE 1 TABLET DAILY  90 tablet  3  . NEXIUM 40 MG capsule TAKE 1 CAPSULE DAILY  90 capsule  3  . polyethylene glycol powder (GLYCOLAX/MIRALAX) powder       . pregabalin (LYRICA) 50 MG capsule Take 1 capsule (50 mg total) by mouth 3 (three) times daily.  90 capsule  0  . promethazine (PHENERGAN) 25 MG tablet TAKE ONE (1) TABLET(S) EVERY SIX (6) HOURS AS NEEDED FOR NAUSEA  30 tablet  0  . sertraline (ZOLOFT) 25 MG tablet TAKE ONE TABLET BY MOUTH ONE TIME DAILY  30 tablet  3  . triazolam (HALCION) 0.25 MG tablet Take 1 tablet (0.25 mg total) by mouth at bedtime as needed. 1 or 2 as needed at bedtime  60 tablet  2  . diltiazem (CARDIZEM CD) 240 MG 24 hr capsule TAKE 1 CAPSULE DAILY  90 capsule  3    BP 122/80  Pulse 72  Temp(Src) 97.7 F (36.5 C) (Oral)  Resp 18  Ht 4\' 10"  (1.473 m)  Wt 124 lb (56.246 kg)  BMI 25.92 kg/m2  SpO2 95%     Review of Systems  Constitutional: Negative for fever, appetite change, fatigue and unexpected weight change.  HENT: Negative for hearing loss, ear pain, nosebleeds, congestion, sore throat, mouth sores, trouble swallowing, neck stiffness, dental problem, voice change, sinus pressure and tinnitus.   Eyes: Negative for photophobia, pain, redness and visual disturbance.  Respiratory: Negative for cough, chest tightness and shortness of breath.   Cardiovascular: Negative for chest pain, palpitations and leg swelling.  Gastrointestinal: Negative for nausea, vomiting, abdominal pain, diarrhea, constipation, blood in stool, abdominal distention and rectal pain.  Genitourinary: Negative for dysuria, urgency, frequency, hematuria, flank pain, vaginal bleeding, vaginal discharge, difficulty urinating, genital sores,  vaginal pain, menstrual problem and pelvic pain.  Musculoskeletal: Positive for myalgias, back pain and arthralgias.  Skin: Negative for rash.  Neurological: Positive for weakness and numbness (lower extremity numbness). Negative for dizziness, syncope, speech difficulty, light-headedness and headaches.  Hematological: Negative for adenopathy. Does not bruise/bleed easily.  Psychiatric/Behavioral: Negative for suicidal ideas, behavioral problems, self-injury, dysphoric mood and agitation. The patient is not nervous/anxious.        Objective:   Physical Exam  Constitutional: She is oriented to person, place, and time. She appears well-developed and well-nourished.  HENT:  Head: Normocephalic and atraumatic.  Right Ear: External ear normal.  Left Ear: External ear normal.  Mouth/Throat: Oropharynx is clear and moist.  Eyes: Conjunctivae and EOM are normal.       Mild ptosis  Neck: Normal range of motion. Neck supple. No JVD present. No thyromegaly present.  Cardiovascular: Normal rate, regular  rhythm, normal heart sounds and intact distal pulses.   No murmur heard. Pulmonary/Chest: Effort normal. She has no wheezes. She has rales.       Few crackles right base only  Abdominal: Soft. Bowel sounds are normal. She exhibits no distension and no mass. There is no tenderness. There is no rebound and no guarding.  Musculoskeletal: Normal range of motion. She exhibits no edema and no tenderness.  Neurological: She is alert and oriented to person, place, and time. She has normal reflexes. No cranial nerve deficit. She exhibits normal muscle tone. Coordination normal.  Skin: Skin is warm and dry. No rash noted.  Psychiatric: She has a normal mood and affect. Her behavior is normal.          Assessment & Plan:   Preventive health exam Hypothyroidism. We'll check a TSH Dyslipidemia. We'll follow up on a lipid profile Fibromyalgia History of anxiety depression  Recheck 6 months

## 2012-03-23 NOTE — Patient Instructions (Signed)
Limit your sodium (Salt) intake    It is important that you exercise regularly, at least 20 minutes 3 to 4 times per week.  If you develop chest pain or shortness of breath seek  medical attention.  Return in 6 months for follow-up  Take a calcium supplement, plus [phone] units of vitamin D  Schedule your colonoscopy to help detect colon cancer.

## 2012-03-24 ENCOUNTER — Telehealth: Payer: Self-pay | Admitting: Internal Medicine

## 2012-03-24 NOTE — Telephone Encounter (Signed)
Pt said he medications were not called in correctly and they were supposed to do to a mail in pharmacy not a local pharmacy, pt seemed very confused about what medication needed to be sent where. Please contact pt

## 2012-03-25 MED ORDER — PROMETHAZINE HCL 25 MG PO TABS: 25.0000 mg | ORAL_TABLET | Freq: Three times a day (TID) | ORAL | Status: DC | PRN

## 2012-03-25 MED ORDER — CLORAZEPATE DIPOTASSIUM 7.5 MG PO TABS: 7.5000 mg | ORAL_TABLET | Freq: Two times a day (BID) | ORAL | Status: DC | PRN

## 2012-03-25 NOTE — Telephone Encounter (Signed)
Spoke with pt- she is requesting that all med rx go to [facility] at this time -

## 2012-04-12 ENCOUNTER — Other Ambulatory Visit: Payer: Self-pay | Admitting: Internal Medicine

## 2012-04-27 ENCOUNTER — Other Ambulatory Visit: Payer: Self-pay

## 2012-04-27 MED ORDER — PREGABALIN 50 MG PO CAPS: 50.0000 mg | ORAL_CAPSULE | Freq: Three times a day (TID) | ORAL | Status: DC

## 2012-05-25 ENCOUNTER — Other Ambulatory Visit: Payer: Self-pay

## 2012-05-25 ENCOUNTER — Other Ambulatory Visit: Payer: Self-pay | Admitting: Internal Medicine

## 2012-05-25 MED ORDER — CLORAZEPATE DIPOTASSIUM 7.5 MG PO TABS: 7.5000 mg | ORAL_TABLET | Freq: Two times a day (BID) | ORAL | Status: DC | PRN

## 2012-05-25 NOTE — Telephone Encounter (Signed)
Called in traxene - but declined halcion - should not need till next month

## 2012-06-22 ENCOUNTER — Other Ambulatory Visit: Payer: Self-pay

## 2012-06-22 ENCOUNTER — Other Ambulatory Visit: Payer: Self-pay | Admitting: Internal Medicine

## 2012-06-22 MED ORDER — TRIAZOLAM 0.25 MG PO TABS: 0.2500 mg | ORAL_TABLET | Freq: Every evening | ORAL | Status: DC | PRN

## 2012-08-17 ENCOUNTER — Telehealth: Payer: Self-pay

## 2012-08-17 DIAGNOSIS — H40059 Ocular hypertension, unspecified eye: Secondary | ICD-10-CM | POA: Diagnosis not present

## 2012-08-17 NOTE — Telephone Encounter (Signed)
Rx request for promethazine hcl 25 mg.

## 2012-08-17 NOTE — Telephone Encounter (Signed)
Rx request for triazolam 0.25 mg.  Rx last filled 06/22/12 #60x 1 rf. Pt last seen 03/23/12.  Pls advise.

## 2012-08-18 MED ORDER — PROMETHAZINE HCL 25 MG PO TABS: 25.0000 mg | ORAL_TABLET | Freq: Three times a day (TID) | ORAL | Status: DC | PRN

## 2012-08-18 MED ORDER — TRIAZOLAM 0.25 MG PO TABS: ORAL_TABLET | ORAL | Status: DC

## 2012-08-18 NOTE — Telephone Encounter (Signed)
ok 

## 2012-08-18 NOTE — Telephone Encounter (Signed)
Rx called in to pharmacy. 

## 2012-09-14 ENCOUNTER — Other Ambulatory Visit: Payer: Self-pay

## 2012-09-14 ENCOUNTER — Other Ambulatory Visit: Payer: Self-pay | Admitting: Internal Medicine

## 2012-09-14 MED ORDER — CLORAZEPATE DIPOTASSIUM 7.5 MG PO TABS: 7.5000 mg | ORAL_TABLET | Freq: Two times a day (BID) | ORAL | Status: DC | PRN

## 2012-09-22 ENCOUNTER — Encounter: Payer: Self-pay | Admitting: Internal Medicine

## 2012-09-22 ENCOUNTER — Ambulatory Visit (INDEPENDENT_AMBULATORY_CARE_PROVIDER_SITE_OTHER): Payer: Medicare Other | Admitting: Internal Medicine

## 2012-09-22 VITALS — BP 112/76 | Temp 98.1°F | Wt 130.0 lb

## 2012-09-22 DIAGNOSIS — F329 Major depressive disorder, single episode, unspecified: Secondary | ICD-10-CM

## 2012-09-22 DIAGNOSIS — Z23 Encounter for immunization: Secondary | ICD-10-CM

## 2012-09-22 DIAGNOSIS — F411 Generalized anxiety disorder: Secondary | ICD-10-CM | POA: Diagnosis not present

## 2012-09-22 DIAGNOSIS — E039 Hypothyroidism, unspecified: Secondary | ICD-10-CM

## 2012-09-22 DIAGNOSIS — I10 Essential (primary) hypertension: Secondary | ICD-10-CM | POA: Diagnosis not present

## 2012-09-22 DIAGNOSIS — F3289 Other specified depressive episodes: Secondary | ICD-10-CM

## 2012-09-22 DIAGNOSIS — M545 Low back pain, unspecified: Secondary | ICD-10-CM

## 2012-09-22 MED ORDER — CLORAZEPATE DIPOTASSIUM 7.5 MG PO TABS: 7.5000 mg | ORAL_TABLET | Freq: Two times a day (BID) | ORAL | Status: DC | PRN

## 2012-09-22 MED ORDER — TRIAZOLAM 0.25 MG PO TABS: ORAL_TABLET | ORAL | Status: DC

## 2012-09-22 NOTE — Progress Notes (Signed)
Subjective:    Patient ID: Heather [name], female    DOB: 05/27/1935, 77 y.o.   MRN: [id number]  HPI  77 year old patient who is seen today for followup. She has a history of treated hypothyroidism. Her TSH was normal 6 months ago. She has a history also of anxiety or depression which has been well-controlled on present therapy She has been on Lyrica for fibromyalgia and there has been some modest weight gain. In general she has done quite well. She has gastroesophageal reflux disease which has been controlled on Nexium. No GI complaints. She does have a history of low back and left hip pain that has been fairly stable  Past Medical History  Diagnosis Date  . ANXIETY 07/23/2009  . DEPRESSION 07/23/2009  . DIVERTICULOSIS, COLON 07/23/2009  . GERD 07/23/2009  . Headache 07/23/2009  . HIP PAIN, LEFT 05/16/2010  . HYPERLIPIDEMIA 07/23/2009  . HYPERTENSION 07/23/2009  . HYPOTHYROIDISM 07/23/2009  . LOW BACK PAIN, CHRONIC 10/01/2009  . PARESTHESIA 10/01/2009  . IBS (irritable bowel syndrome)   . History of Crohn's disease   . Chronic pain   . Hypothyroidism   . Insomnia     History   Social History  . Marital Status: Widowed    Spouse Name: N/A    Number of Children: N/A  . Years of Education: N/A   Occupational History  . Not on file.   Social History Main Topics  . Smoking status: Never Smoker   . Smokeless tobacco: Never Used  . Alcohol Use: No  . Drug Use: No  . Sexually Active: Not on file   Other Topics Concern  . Not on file   Social History Narrative  . No narrative on file    Past Surgical History  Procedure Date  . Appendectomy   . Abdominal hysterectomy   . Tonsillectomy     No family history on file.  Allergies  Allergen Reactions  . Duloxetine   . Metronidazole   . Rofecoxib     Current Outpatient Prescriptions on File Prior to Visit  Medication Sig Dispense Refill  . atorvastatin (LIPITOR) 20 MG tablet Take 1 tablet (20 mg total) by mouth  daily.  90 tablet  3  . diltiazem (CARDIZEM CD) 240 MG 24 hr capsule Take 1 capsule (240 mg total) by mouth daily.  90 capsule  3  . esomeprazole (NEXIUM) 40 MG capsule Take 1 capsule (40 mg total) by mouth daily before breakfast.  90 capsule  3  . estropipate (OGEN) 1.5 MG tablet TAKE 1 TABLET EVERY MORNING  90 tablet  3  . levothyroxine (SYNTHROID, LEVOTHROID) 25 MCG tablet Take 1 tablet (25 mcg total) by mouth daily.  90 tablet  3  . polyethylene glycol powder (GLYCOLAX/MIRALAX) powder       . pregabalin (LYRICA) 50 MG capsule Take 1 capsule (50 mg total) by mouth 3 (three) times daily.  90 capsule  5  . promethazine (PHENERGAN) 25 MG tablet TAKE ONE TABLET BY MOUTH EVERY EIGHT HOURS AS NEEDED FOR NAUSEA  20 tablet  0  . sertraline (ZOLOFT) 25 MG tablet Take 1 tablet (25 mg total) by mouth daily.  30 tablet  3  . DISCONTD: triazolam (HALCION) 0.25 MG tablet 1 or 2 as needed at bedtime  60 tablet  1    BP 112/76  Temp 98.1 F (36.7 C) (Oral)  Wt 130 lb (58.968 kg)       Review of Systems  Constitutional: Positive for  unexpected weight change.  HENT: Negative for hearing loss, congestion, sore throat, rhinorrhea, dental problem, sinus pressure and tinnitus.   Eyes: Negative for pain, discharge and visual disturbance.  Respiratory: Negative for cough and shortness of breath.   Cardiovascular: Negative for chest pain, palpitations and leg swelling.  Gastrointestinal: Negative for nausea, vomiting, abdominal pain, diarrhea, constipation, blood in stool and abdominal distention.  Genitourinary: Negative for dysuria, urgency, frequency, hematuria, flank pain, vaginal bleeding, vaginal discharge, difficulty urinating, vaginal pain and pelvic pain.  Musculoskeletal: Positive for back pain and arthralgias. Negative for joint swelling and gait problem.  Skin: Negative for rash.  Neurological: Negative for dizziness, syncope, speech difficulty, weakness, numbness and headaches.    Hematological: Negative for adenopathy.  Psychiatric/Behavioral: Negative for behavioral problems, dysphoric mood and agitation. The patient is not nervous/anxious.        Objective:   Physical Exam  Constitutional: She is oriented to person, place, and time. She appears well-developed and well-nourished.  HENT:  Head: Normocephalic.  Right Ear: External ear normal.  Left Ear: External ear normal.  Mouth/Throat: Oropharynx is clear and moist.  Eyes: Conjunctivae normal and EOM are normal. Pupils are equal, round, and reactive to light.  Neck: Normal range of motion. Neck supple. No thyromegaly present.  Cardiovascular: Normal rate, regular rhythm, normal heart sounds and intact distal pulses.   Pulmonary/Chest: Effort normal and breath sounds normal.  Abdominal: Soft. Bowel sounds are normal. She exhibits no mass. There is no tenderness.  Musculoskeletal: Normal range of motion.  Lymphadenopathy:    She has no cervical adenopathy.  Neurological: She is alert and oriented to person, place, and time.  Skin: Skin is warm and dry. No rash noted.  Psychiatric: She has a normal mood and affect. Her behavior is normal.       Bright affect today          Assessment & Plan:   Anxiety depression. Appears to be stable Modest weight gain. We'll continue Lyrica the patient will a moderate her caloric intake try to get more exercise. Hypertension stable GERD stable Dyslipidemia will  Continue atorvastatin

## 2012-09-22 NOTE — Patient Instructions (Signed)
Limit your sodium (Salt) intake    It is important that you exercise regularly, at least 20 minutes 3 to 4 times per week.  If you develop chest pain or shortness of breath seek  medical attention. 

## 2012-10-14 ENCOUNTER — Other Ambulatory Visit: Payer: Self-pay

## 2012-10-14 MED ORDER — SERTRALINE HCL 25 MG PO TABS: 25.0000 mg | ORAL_TABLET | Freq: Every day | ORAL | Status: DC

## 2012-10-14 MED ORDER — PROMETHAZINE HCL 25 MG PO TABS: 25.0000 mg | ORAL_TABLET | Freq: Three times a day (TID) | ORAL | Status: DC | PRN

## 2012-10-15 ENCOUNTER — Other Ambulatory Visit: Payer: Self-pay

## 2012-10-15 NOTE — Telephone Encounter (Signed)
Request for halcion too early - was called in 09-22-12 # 60 1 RF - not due until 11-22-12

## 2012-11-18 DIAGNOSIS — Z1211 Encounter for screening for malignant neoplasm of colon: Secondary | ICD-10-CM | POA: Diagnosis not present

## 2012-11-18 DIAGNOSIS — K5901 Slow transit constipation: Secondary | ICD-10-CM | POA: Diagnosis not present

## 2012-12-03 ENCOUNTER — Other Ambulatory Visit: Payer: Self-pay | Admitting: Internal Medicine

## 2012-12-03 MED ORDER — PROMETHAZINE HCL 25 MG PO TABS: 25.0000 mg | ORAL_TABLET | Freq: Three times a day (TID) | ORAL | Status: DC | PRN

## 2012-12-03 NOTE — Telephone Encounter (Signed)
Spoke to pt told her refilled Phenergan and there was refills left on Lyrica and pharmacy should be getting ready for her. Pt verbalized understanding.

## 2012-12-03 NOTE — Telephone Encounter (Addendum)
Pt needs refill of pregabalin (LYRICA) 50 MG capsule and promethazine (PHENERGAN) 25 MG tablet . Pt has called the pharm, but she is completely out and needs as soon as we can do.  Thank you.

## 2012-12-23 ENCOUNTER — Other Ambulatory Visit: Payer: Self-pay | Admitting: Internal Medicine

## 2012-12-23 MED ORDER — TEMAZEPAM 15 MG PO CAPS: 15.0000 mg | ORAL_CAPSULE | Freq: Every evening | ORAL | Status: DC | PRN

## 2012-12-23 NOTE — Telephone Encounter (Signed)
Spoke to pt told her called into pharmacy Restoril 15 mg capsule to take at bedtime as needed. Pt verbalized understanding.

## 2012-12-23 NOTE — Telephone Encounter (Signed)
Pt takes triazolam (HALCION) 0.25 MG tablet for sleep. But due to a 200% increase in price, pt would like to have an alternative. Pharm: [facility]/ [place]  Pt is out and needs something ASAP.

## 2012-12-23 NOTE — Telephone Encounter (Signed)
Generic Restoril 15 mg #30 one at bedtime when necessary sleep refill x1

## 2012-12-28 ENCOUNTER — Telehealth: Payer: Self-pay | Admitting: Internal Medicine

## 2012-12-28 MED ORDER — ZOLPIDEM TARTRATE 5 MG PO TABS: 5.0000 mg | ORAL_TABLET | Freq: Every evening | ORAL | Status: DC | PRN

## 2012-12-28 MED ORDER — TRAMADOL HCL 50 MG PO TABS: 50.0000 mg | ORAL_TABLET | Freq: Four times a day (QID) | ORAL | Status: DC | PRN

## 2012-12-28 NOTE — Telephone Encounter (Signed)
Spoke to pt told her will be calling in two Rx's for her Tramadol 50 mg one tablet by mouth every 6 hours as needed for pain and Ambien 5 mg one tablet at bedtime as needed for sleep. Pt verbalized understanding. Rx's called into pharmacy.

## 2012-12-28 NOTE — Telephone Encounter (Signed)
Generic Ambien 5 mg #30 one at bedtime as needed for sleep

## 2012-12-28 NOTE — Telephone Encounter (Signed)
Patient Information:  Caller Name: Pamel  Phone: [phone]  Patient: Heather [name], Heather [name]  Gender: Female  DOB: 1935-03-24  Age: 78 Years  PCP: Bluford Kaufmann [facility])  Office Follow Up:  Does the office need to follow up with this patient?: Yes  Instructions For The Office: Wants renewal of pain medication, and change sleep Rx if possible krs/can  RN Note:  States this is on going problem.  States flare up of leg pain, radiating from the left knee to the groin; states it feels hot.  States has taken halcyon and this helps, but the Rx went from $21 to $60 in price.  Something new was called in due to cost, and this medication caused nightmares.  Per Epic, was placed on restoril.  Would like new Rx for something to help her sleep, and also would like something for pain.  Per leg pain protocol, emergent symptoms denied; advised and offered appt for See Within 2 Weeks' disposition.  Patient declines at this time; prefers renewal of pain medication and switch to a different sleeping medication, as Restoril caused nightmares.  Info to office for provider review/Rx/callback.  Uses [facility] [phone].  May reach patient [phone].  krs/can  Symptoms  Reason For Call & Symptoms: leg pain  Reviewed Health History In EMR: Yes  Reviewed Medications In EMR: Yes  Reviewed Allergies In EMR: Yes  Reviewed Surgeries / Procedures: Yes  Date of Onset of Symptoms: Unknown  Guideline(s) Used:  Leg Pain  Disposition Per Guideline:   See Within 2 Weeks in Office  Reason For Disposition Reached:   Mild pain persists > 7 days  Advice Given:  N/A  Patient Refused Recommendation:  Patient Requests Prescription  Wants renewal of pain medication, and change sleep Rx if possible krs/can

## 2012-12-28 NOTE — Addendum Note (Signed)
Addended by: Marian Sorrow on: 12/28/2012 05:33 PM   Modules accepted: Orders

## 2013-01-24 ENCOUNTER — Other Ambulatory Visit: Payer: Self-pay | Admitting: Internal Medicine

## 2013-01-26 ENCOUNTER — Other Ambulatory Visit: Payer: Self-pay | Admitting: Internal Medicine

## 2013-02-17 ENCOUNTER — Other Ambulatory Visit: Payer: Self-pay | Admitting: Internal Medicine

## 2013-02-19 ENCOUNTER — Other Ambulatory Visit: Payer: Self-pay | Admitting: *Deleted

## 2013-02-19 MED ORDER — ESOMEPRAZOLE MAGNESIUM 40 MG PO CPDR: 40.0000 mg | DELAYED_RELEASE_CAPSULE | Freq: Every day | ORAL | Status: DC

## 2013-03-16 ENCOUNTER — Other Ambulatory Visit: Payer: Self-pay | Admitting: Internal Medicine

## 2013-03-29 ENCOUNTER — Ambulatory Visit (INDEPENDENT_AMBULATORY_CARE_PROVIDER_SITE_OTHER): Payer: Medicare Other | Admitting: Internal Medicine

## 2013-03-29 ENCOUNTER — Encounter: Payer: Self-pay | Admitting: Internal Medicine

## 2013-03-29 VITALS — BP 150/70 | HR 86 | Temp 98.2°F | Resp 20 | Ht <= 58 in | Wt 131.0 lb

## 2013-03-29 DIAGNOSIS — I10 Essential (primary) hypertension: Secondary | ICD-10-CM | POA: Diagnosis not present

## 2013-03-29 DIAGNOSIS — E039 Hypothyroidism, unspecified: Secondary | ICD-10-CM | POA: Diagnosis not present

## 2013-03-29 DIAGNOSIS — Z0000 Encounter for general adult medical examination without abnormal findings: Secondary | ICD-10-CM

## 2013-03-29 DIAGNOSIS — M545 Low back pain, unspecified: Secondary | ICD-10-CM | POA: Diagnosis not present

## 2013-03-29 DIAGNOSIS — E785 Hyperlipidemia, unspecified: Secondary | ICD-10-CM | POA: Diagnosis not present

## 2013-03-29 DIAGNOSIS — K219 Gastro-esophageal reflux disease without esophagitis: Secondary | ICD-10-CM

## 2013-03-29 LAB — COMPREHENSIVE METABOLIC PANEL
ALT: 10 U/L (ref 0–35)
AST: 19 U/L (ref 0–37)
Albumin: 3.8 g/dL (ref 3.5–5.2)
Alkaline Phosphatase: 104 U/L (ref 39–117)
BUN: 11 mg/dL (ref 6–23)
CO2: 29 mEq/L (ref 19–32)
Calcium: 9.3 mg/dL (ref 8.4–10.5)
Chloride: 101 mEq/L (ref 96–112)
Creatinine, Ser: 0.9 mg/dL (ref 0.4–1.2)
GFR: 69.15 mL/min (ref >60.00)
Glucose, Bld: 106 mg/dL — ABNORMAL HIGH (ref 70–99)
Potassium: 4.4 mEq/L (ref 3.5–5.1)
Sodium: 138 mEq/L (ref 135–145)
Total Bilirubin: 0.6 mg/dL (ref 0.3–1.2)
Total Protein: 7.5 g/dL (ref 6.0–8.3)

## 2013-03-29 LAB — CBC WITH DIFFERENTIAL/PLATELET
Basophils Absolute: 0.1 10*3/uL (ref 0.0–0.1)
Basophils Relative: 0.6 % (ref 0.0–3.0)
Eosinophils Absolute: 0.1 10*3/uL (ref 0.0–0.7)
Eosinophils Relative: 1 % (ref 0.0–5.0)
HCT: 31.4 % — ABNORMAL LOW (ref 36.0–46.0)
Hemoglobin: 9.7 g/dL — ABNORMAL LOW (ref 12.0–15.0)
Lymphocytes Relative: 22.3 % (ref 12.0–46.0)
Lymphs Abs: 2 10*3/uL (ref 0.7–4.0)
MCHC: 31 g/dL (ref 30.0–36.0)
MCV: 75 fl — ABNORMAL LOW (ref 78.0–100.0)
Monocytes Absolute: 0.6 10*3/uL (ref 0.1–1.0)
Monocytes Relative: 6.7 % (ref 3.0–12.0)
Neutro Abs: 6.1 10*3/uL (ref 1.4–7.7)
Neutrophils Relative %: 69.4 % (ref 43.0–77.0)
Platelets: 453 10*3/uL — ABNORMAL HIGH (ref 150.0–400.0)
RBC: 4.18 Mil/uL (ref 3.87–5.11)
RDW: 18.5 % — ABNORMAL HIGH (ref 11.5–14.6)
WBC: 8.8 10*3/uL (ref 4.5–10.5)

## 2013-03-29 LAB — LIPID PANEL
Cholesterol: 246 mg/dL — ABNORMAL HIGH (ref 0–200)
HDL: 96 mg/dL (ref >39.00)
Total CHOL/HDL Ratio: 3
Triglycerides: 166 mg/dL — ABNORMAL HIGH (ref 0.0–149.0)
VLDL: 33.2 mg/dL (ref 0.0–40.0)

## 2013-03-29 LAB — TSH: TSH: 1.28 u[IU]/mL (ref 0.35–5.50)

## 2013-03-29 LAB — LDL CHOLESTEROL, DIRECT: Direct LDL: 118.2 mg/dL

## 2013-03-29 NOTE — Progress Notes (Signed)
Patient ID: Heather [name], female   DOB: Sep 27, 1935, 78 y.o.   MRN: [id number]  Subjective:    Patient ID: Heather [name], female    DOB: 04-08-1935, 78 y.o.   MRN: [id number]  HPI  History of Present Illness:   78 year-old patient who is seen today for a wellness exam. She has a history of depression. Lower leg paresthesias, chronic low back pain. She has been given a diagnosis in the past of fibromyalgia. She is on Lyrica. Allergies include Cymbalta. She has hypertension and dyslipidemia.  She has been seen by GI recently who does not feel that further colonoscopies are indicated. She apparently has remote history of Crohn's disease that has been quiescent.  Here for Medicare AWV:   1. Risk factors based on Past M, S, F history: Her vascular risk factors include hypertension, and dyslipidemia.  2. Physical Activities: fairly sedentary limited somewhat by back and hip pain  3. Depression/mood:  history depression  stable on sertraline 4. Hearing: no deficits  5. ADL's: independent in all aspects of daily living  6. Fall Risk: moderate due to age and hip pain  7. Home Safety: no problems identified  8. Height, weight, &visual acuity:height and weight stable. No change in visual acuity  9. Counseling: heart healthy diet restricted salt diet. All encouraged  10. Labs ordered based on risk factors: return profile, including lipid panel will be reviewed. Electrolytes will be monitored  11. Referral Coordination- annual mammogram. Encouraged will consider orthopedic referral  12. Care Plan- heart healthy diet more regular exercise. Encouraged  13. Cognitive Assessment- alert and oriented, with normal affect. No history of cognitive impairment or memory disturbance   Allergies:  1) ! Cymbalta (Duloxetine Hcl)  2) ! Vioxx  3) ! Metronidazole (Metronidazole)   Past History:  Past Medical History:  Reviewed history from 10/01/2009 and no changes required.  Depression   Hyperlipidemia  Hypertension  chronic pain syndrome  paresthesias of the legs  GERD  Hypothyroidism  Anxiety  insomnia  menopausal syndrome  Diverticulosis, colon  IBS  Headache  history of acute urinary retention  chronic constipation  history of Crohn's colitis  shingles 10/06  IGT   Past Surgical History:  Reviewed history from 10/01/2009 and no changes required.  colonoscopy 2008 (Buccini) Appendectomy  Hysterectomy age 36  Tonsillectomy  abdominal pelvic CT December 2008   Family History:  Reviewed history from 07/23/2009 and no changes required.  father died age 34-history of R. A. TB, died of cancer, unclear type  mother died mid 25s cancer, unclear type  Four sisters no brothers-positive for breast cancer, hypertension, dyslipidemia, end-stage COPD   Social History:  Reviewed history from 07/23/2009 and no changes required.  Retired  Widow/Widower  Never Smoked   Past Medical History  Diagnosis Date  . ANXIETY 07/23/2009  . DEPRESSION 07/23/2009  . DIVERTICULOSIS, COLON 07/23/2009  . GERD 07/23/2009  . Headache 07/23/2009  . HIP PAIN, LEFT 05/16/2010  . HYPERLIPIDEMIA 07/23/2009  . HYPERTENSION 07/23/2009  . HYPOTHYROIDISM 07/23/2009  . LOW BACK PAIN, CHRONIC 10/01/2009  . PARESTHESIA 10/01/2009  . IBS (irritable bowel syndrome)   . History of Crohn's disease   . Chronic pain   . Hypothyroidism   . Insomnia     History   Social History  . Marital Status: Widowed    Spouse Name: N/A    Number of Children: N/A  . Years of Education: N/A   Occupational History  . Not on file.  Social History Main Topics  . Smoking status: Never Smoker   . Smokeless tobacco: Never Used  . Alcohol Use: No  . Drug Use: No  . Sexually Active: Not on file   Other Topics Concern  . Not on file   Social History Narrative  . No narrative on file    Past Surgical History  Procedure Laterality Date  . Appendectomy    . Abdominal hysterectomy    .  Tonsillectomy      No family history on file.  Allergies  Allergen Reactions  . Duloxetine   . Metronidazole   . Rofecoxib     Current Outpatient Prescriptions on File Prior to Visit  Medication Sig Dispense Refill  . atorvastatin (LIPITOR) 20 MG tablet Take 1 tablet (20 mg total) by mouth daily.  90 tablet  3  . clorazepate (TRANXENE) 7.5 MG tablet TAKE ONE TABLET BY MOUTH TWICE DAILY  90 tablet  1  . diltiazem (CARDIZEM CD) 240 MG 24 hr capsule Take 1 capsule (240 mg total) by mouth daily.  90 capsule  3  . esomeprazole (NEXIUM) 40 MG capsule Take 1 capsule (40 mg total) by mouth daily before breakfast.  90 capsule  3  . estropipate (OGEN) 1.5 MG tablet TAKE 1 TABLET EVERY MORNING  90 tablet  3  . levothyroxine (SYNTHROID, LEVOTHROID) 25 MCG tablet Take 1 tablet (25 mcg total) by mouth daily.  90 tablet  3  . Polyethyl Glycol-Propyl Glycol (SYSTANE OP) Apply to eye 2 (two) times daily.      . polyethylene glycol powder (GLYCOLAX/MIRALAX) powder       . pregabalin (LYRICA) 50 MG capsule Take 1 capsule (50 mg total) by mouth 3 (three) times daily.  90 capsule  5  . promethazine (PHENERGAN) 25 MG tablet TAKE 1 TABLET BY MOUTH EVERY 8 HOURS AS NEEDED FOR NAUSEA  20 tablet  0  . sertraline (ZOLOFT) 25 MG tablet Take 1 tablet (25 mg total) by mouth daily.  30 tablet  5  . temazepam (RESTORIL) 15 MG capsule Take 1 capsule (15 mg total) by mouth at bedtime as needed for sleep.  30 capsule  1  . traMADol (ULTRAM) 50 MG tablet TAKE ONE TABLET EVERY 6 HOURS AS NEEDED FOR PAIN  60 tablet  2  . zolpidem (AMBIEN) 5 MG tablet TAKE 1 TABLET BY MOUTH DAILY AS NEEDED FOR SLEEP  30 tablet  2   No current facility-administered medications on file prior to visit.    BP 150/70  Pulse 86  Temp(Src) 98.2 F (36.8 C) (Oral)  Resp 20  Ht 4\' 10"  (1.473 m)  Wt 131 lb (59.421 kg)  BMI 27.39 kg/m2  SpO2 94%     Review of Systems  Constitutional: Negative for fever, appetite change, fatigue and  unexpected weight change.  HENT: Negative for hearing loss, ear pain, nosebleeds, congestion, sore throat, mouth sores, trouble swallowing, neck stiffness, dental problem, voice change, sinus pressure and tinnitus.   Eyes: Negative for photophobia, pain, redness and visual disturbance.  Respiratory: Negative for cough, chest tightness and shortness of breath.   Cardiovascular: Negative for chest pain, palpitations and leg swelling.  Gastrointestinal: Negative for nausea, vomiting, abdominal pain, diarrhea, constipation, blood in stool, abdominal distention and rectal pain.  Genitourinary: Negative for dysuria, urgency, frequency, hematuria, flank pain, vaginal bleeding, vaginal discharge, difficulty urinating, genital sores, vaginal pain, menstrual problem and pelvic pain.  Musculoskeletal: Positive for myalgias, back pain and arthralgias.  Skin: Negative  for rash.  Neurological: Positive for weakness and numbness (lower extremity numbness). Negative for dizziness, syncope, speech difficulty, light-headedness and headaches.  Hematological: Negative for adenopathy. Does not bruise/bleed easily.  Psychiatric/Behavioral: Negative for suicidal ideas, behavioral problems, self-injury, dysphoric mood and agitation. The patient is not nervous/anxious.        Objective:   Physical Exam  Constitutional: She is oriented to person, place, and time. She appears well-developed and well-nourished.  HENT:  Head: Normocephalic and atraumatic.  Right Ear: External ear normal.  Left Ear: External ear normal.  Mouth/Throat: Oropharynx is clear and moist.  Eyes: Conjunctivae and EOM are normal.  Mild ptosis  Neck: Normal range of motion. Neck supple. No JVD present. No thyromegaly present.  Cardiovascular: Normal rate, regular rhythm, normal heart sounds and intact distal pulses.   No murmur heard. Posterior tibial pulses are full. Dorsalis pedis pulses faint  Pulmonary/Chest: Effort normal. She has no  wheezes. She has rales.  Few crackles right base only  Abdominal: Soft. Bowel sounds are normal. She exhibits no distension and no mass. There is no tenderness. There is no rebound and no guarding.  Musculoskeletal: Normal range of motion. She exhibits no edema and no tenderness.  Neurological: She is alert and oriented to person, place, and time. She has normal reflexes. No cranial nerve deficit. She exhibits normal muscle tone. Coordination normal.  Skin: Skin is warm and dry. No rash noted.  Psychiatric: She has a normal mood and affect. Her behavior is normal.          Assessment & Plan:   Preventive health exam Hypothyroidism. We'll check a TSH Dyslipidemia. We'll follow up on a lipid profile Fibromyalgia History of anxiety depression  Recheck 6 months

## 2013-03-29 NOTE — Patient Instructions (Signed)
Limit your sodium (Salt) intake    It is important that you exercise regularly, at least 20 minutes 3 to 4 times per week.  If you develop chest pain or shortness of breath seek  medical attention.  You need to lose weight.  Consider a lower calorie diet and regular exercise.  Return in 6 months for follow-up   

## 2013-03-30 ENCOUNTER — Other Ambulatory Visit: Payer: Self-pay | Admitting: Internal Medicine

## 2013-03-30 DIAGNOSIS — D649 Anemia, unspecified: Secondary | ICD-10-CM

## 2013-04-05 ENCOUNTER — Other Ambulatory Visit (INDEPENDENT_AMBULATORY_CARE_PROVIDER_SITE_OTHER): Payer: Medicare Other

## 2013-04-05 DIAGNOSIS — K5731 Diverticulosis of large intestine without perforation or abscess with bleeding: Secondary | ICD-10-CM

## 2013-04-05 LAB — HEMOCCULT GUIAC POC 1CARD (OFFICE)
Card #2 Fecal Occult Blod, POC: NEGATIVE
Card #3 Fecal Occult Blood, POC: NEGATIVE
Fecal Occult Blood, POC: NEGATIVE

## 2013-04-11 ENCOUNTER — Other Ambulatory Visit: Payer: Self-pay | Admitting: Internal Medicine

## 2013-04-13 ENCOUNTER — Other Ambulatory Visit (INDEPENDENT_AMBULATORY_CARE_PROVIDER_SITE_OTHER): Payer: Medicare Other

## 2013-04-13 DIAGNOSIS — D649 Anemia, unspecified: Secondary | ICD-10-CM | POA: Diagnosis not present

## 2013-04-13 LAB — CBC WITH DIFFERENTIAL/PLATELET
Basophils Absolute: 0 10*3/uL (ref 0.0–0.1)
Basophils Relative: 0.5 % (ref 0.0–3.0)
Eosinophils Absolute: 0.2 10*3/uL (ref 0.0–0.7)
Eosinophils Relative: 2.7 % (ref 0.0–5.0)
HCT: 34.5 % — ABNORMAL LOW (ref 36.0–46.0)
Hemoglobin: 11.1 g/dL — ABNORMAL LOW (ref 12.0–15.0)
Lymphocytes Relative: 31.4 % (ref 12.0–46.0)
Lymphs Abs: 2.4 10*3/uL (ref 0.7–4.0)
MCHC: 32 g/dL (ref 30.0–36.0)
MCV: 78.1 fl (ref 78.0–100.0)
Monocytes Absolute: 0.7 10*3/uL (ref 0.1–1.0)
Monocytes Relative: 9.4 % (ref 3.0–12.0)
Neutro Abs: 4.2 10*3/uL (ref 1.4–7.7)
Neutrophils Relative %: 56 % (ref 43.0–77.0)
Platelets: 485 10*3/uL — ABNORMAL HIGH (ref 150.0–400.0)
RBC: 4.42 Mil/uL (ref 3.87–5.11)
RDW: 24.8 % — ABNORMAL HIGH (ref 11.5–14.6)
WBC: 7.6 10*3/uL (ref 4.5–10.5)

## 2013-05-09 ENCOUNTER — Other Ambulatory Visit: Payer: Self-pay | Admitting: Internal Medicine

## 2013-05-26 ENCOUNTER — Other Ambulatory Visit: Payer: Self-pay | Admitting: Internal Medicine

## 2013-05-26 NOTE — Telephone Encounter (Signed)
Request from pharmacy refill for Phenergan, please advise.

## 2013-05-26 NOTE — Telephone Encounter (Signed)
ok 

## 2013-05-27 ENCOUNTER — Other Ambulatory Visit: Payer: Self-pay | Admitting: Internal Medicine

## 2013-06-06 ENCOUNTER — Other Ambulatory Visit: Payer: Self-pay | Admitting: Internal Medicine

## 2013-06-13 ENCOUNTER — Other Ambulatory Visit: Payer: Self-pay | Admitting: Internal Medicine

## 2013-06-15 NOTE — Telephone Encounter (Signed)
Spoke to pt told her Rx's called into pharmacy and she can take OTC Iron. Pt verbalized understanding.

## 2013-06-15 NOTE — Telephone Encounter (Addendum)
Pt is out of meds  Pt is in pain also. [facility] will deliver if RX is in by 9am. zolpidem (AMBIEN) 5 MG tablet  1 prn traMADol (ULTRAM) 50 MG tablet  1 / 6 hrs prn promethazine (PHENERGAN) 25 MG tablet  1 / 8 hrs prn All 90 day Pharm: [facility] / [place]  Pt was told she needs to take an IRON supplement.  Pt would like to know what dosage.[name]

## 2013-07-06 ENCOUNTER — Other Ambulatory Visit: Payer: Self-pay | Admitting: Internal Medicine

## 2013-07-25 ENCOUNTER — Other Ambulatory Visit: Payer: Self-pay | Admitting: Internal Medicine

## 2013-07-25 ENCOUNTER — Other Ambulatory Visit: Payer: Self-pay | Admitting: *Deleted

## 2013-07-25 MED ORDER — CLORAZEPATE DIPOTASSIUM 7.5 MG PO TABS: ORAL_TABLET | ORAL | Status: DC

## 2013-08-03 ENCOUNTER — Other Ambulatory Visit: Payer: Self-pay | Admitting: Internal Medicine

## 2013-08-04 ENCOUNTER — Other Ambulatory Visit: Payer: Self-pay | Admitting: Internal Medicine

## 2013-08-05 ENCOUNTER — Other Ambulatory Visit: Payer: Self-pay | Admitting: *Deleted

## 2013-08-05 MED ORDER — TRAMADOL HCL 50 MG PO TABS: ORAL_TABLET | ORAL | Status: DC

## 2013-08-16 ENCOUNTER — Other Ambulatory Visit: Payer: Self-pay | Admitting: Internal Medicine

## 2013-08-30 ENCOUNTER — Other Ambulatory Visit: Payer: Self-pay | Admitting: Internal Medicine

## 2013-08-31 NOTE — Telephone Encounter (Signed)
Please advise if okay to refill Phenergan was last filled on 9/9 # 20.

## 2013-09-01 NOTE — Telephone Encounter (Signed)
Rx was sent to 9/23.

## 2013-09-05 ENCOUNTER — Other Ambulatory Visit: Payer: Self-pay | Admitting: Internal Medicine

## 2013-09-20 ENCOUNTER — Other Ambulatory Visit: Payer: Self-pay | Admitting: Internal Medicine

## 2013-09-28 ENCOUNTER — Ambulatory Visit (INDEPENDENT_AMBULATORY_CARE_PROVIDER_SITE_OTHER): Payer: Medicare Other | Admitting: Internal Medicine

## 2013-09-28 ENCOUNTER — Encounter: Payer: Self-pay | Admitting: Internal Medicine

## 2013-09-28 VITALS — BP 132/70 | HR 88 | Temp 98.2°F | Wt 130.0 lb

## 2013-09-28 DIAGNOSIS — M545 Low back pain, unspecified: Secondary | ICD-10-CM | POA: Diagnosis not present

## 2013-09-28 DIAGNOSIS — E785 Hyperlipidemia, unspecified: Secondary | ICD-10-CM

## 2013-09-28 DIAGNOSIS — J069 Acute upper respiratory infection, unspecified: Secondary | ICD-10-CM

## 2013-09-28 DIAGNOSIS — E039 Hypothyroidism, unspecified: Secondary | ICD-10-CM | POA: Diagnosis not present

## 2013-09-28 DIAGNOSIS — I10 Essential (primary) hypertension: Secondary | ICD-10-CM | POA: Diagnosis not present

## 2013-09-28 NOTE — Progress Notes (Signed)
Subjective:    Patient ID: Heather [name], female    DOB: 1935-01-17, 78 y.o.   MRN: [id number]  HPI  78 year old patient who is seen today for her six-month followup. For the past week or 2 she has had increased cough congestion and some nocturnal wheezing. She has dyslipidemia hypertension and chronic low back pain. In general she is doing reasonably well except for recent URI symptoms  Past Medical History  Diagnosis Date  . ANXIETY 07/23/2009  . DEPRESSION 07/23/2009  . DIVERTICULOSIS, COLON 07/23/2009  . GERD 07/23/2009  . Headache(784.0) 07/23/2009  . HIP PAIN, LEFT 05/16/2010  . HYPERLIPIDEMIA 07/23/2009  . HYPERTENSION 07/23/2009  . HYPOTHYROIDISM 07/23/2009  . LOW BACK PAIN, CHRONIC 10/01/2009  . PARESTHESIA 10/01/2009  . IBS (irritable bowel syndrome)   . History of Crohn's disease   . Chronic pain   . Hypothyroidism   . Insomnia     History   Social History  . Marital Status: Widowed    Spouse Name: N/A    Number of Children: N/A  . Years of Education: N/A   Occupational History  . Not on file.   Social History Main Topics  . Smoking status: Never Smoker   . Smokeless tobacco: Never Used  . Alcohol Use: No  . Drug Use: No  . Sexual Activity: Not on file   Other Topics Concern  . Not on file   Social History Narrative  . No narrative on file    Past Surgical History  Procedure Laterality Date  . Appendectomy    . Abdominal hysterectomy    . Tonsillectomy      No family history on file.  Allergies  Allergen Reactions  . Duloxetine   . Metronidazole   . Rofecoxib     Current Outpatient Prescriptions on File Prior to Visit  Medication Sig Dispense Refill  . atorvastatin (LIPITOR) 20 MG tablet Take 1 tablet (20 mg total) by mouth daily.  90 tablet  3  . CARTIA XT 240 MG 24 hr capsule TAKE ONE CAPSULE BY MOUTH ONE TIME DAILY  90 capsule  0  . clorazepate (TRANXENE) 7.5 MG tablet TAKE 1 TABLET BY MOUTH TWICE DAILY  180 tablet  0  . estropipate  (OGEN) 1.5 MG tablet TAKE 1 TABLET EVERY MORNING  90 tablet  3  . furosemide (LASIX) 20 MG tablet TAKE 1 TABLET EVERY MORNING TO CONTROL SWELLING OF THE FEET  30 tablet  2  . levothyroxine (SYNTHROID, LEVOTHROID) 25 MCG tablet TAKE 1 TABLET BY MOUTH EVERY DAY  90 tablet  1  . LYRICA 50 MG capsule TAKE 1 CAPSULE BY MOUTH THREE TIMES DAILY  90 capsule  2  . NEXIUM 40 MG capsule TAKE ONE CAPSULE BY MOUTH ONE TIME DAILY  90 capsule  1  . Polyethyl Glycol-Propyl Glycol (SYSTANE OP) Apply to eye 2 (two) times daily.      . polyethylene glycol powder (GLYCOLAX/MIRALAX) powder       . promethazine (PHENERGAN) 25 MG tablet TAKE 1 TABLET BY MOUTH EVERY 8 HOURS AS NEEDED FOR NAUSEA  20 tablet  1  . sertraline (ZOLOFT) 25 MG tablet TAKE 1 TABLET BY MOUTH EVERY DAY  30 tablet  2  . temazepam (RESTORIL) 15 MG capsule Take 1 capsule (15 mg total) by mouth at bedtime as needed for sleep.  30 capsule  1  . traMADol (ULTRAM) 50 MG tablet TAKE 1 TABLET BY MOUTH EVERY 6 HOURS AS NEEDED FOR PAIN  60 tablet  2  . zolpidem (AMBIEN) 5 MG tablet TAKE 1 TABLET BY MOUTH AT BEDTIME AS NEEDED SLEEP  30 tablet  2  . furosemide (LASIX) 20 MG tablet One daily in the morning to control swelling of the feet  30 tablet  11   No current facility-administered medications on file prior to visit.    BP 132/70  Pulse 88  Temp(Src) 98.2 F (36.8 C) (Oral)  Wt 130 lb (58.968 kg)  BMI 27.18 kg/m2  SpO2 93%       Review of Systems  Constitutional: Positive for fatigue.  HENT: Negative for congestion, dental problem, hearing loss, rhinorrhea, sinus pressure, sore throat and tinnitus.   Eyes: Negative for pain, discharge and visual disturbance.  Respiratory: Positive for cough and wheezing. Negative for shortness of breath.   Cardiovascular: Negative for chest pain, palpitations and leg swelling.  Gastrointestinal: Negative for nausea, vomiting, abdominal pain, diarrhea, constipation, blood in stool and abdominal distention.   Genitourinary: Negative for dysuria, urgency, frequency, hematuria, flank pain, vaginal bleeding, vaginal discharge, difficulty urinating, vaginal pain and pelvic pain.  Musculoskeletal: Negative for arthralgias, gait problem and joint swelling.  Skin: Negative for rash.  Neurological: Negative for dizziness, syncope, speech difficulty, weakness, numbness and headaches.  Hematological: Negative for adenopathy.  Psychiatric/Behavioral: Negative for behavioral problems, dysphoric mood and agitation. The patient is not nervous/anxious.        Objective:   Physical Exam  Constitutional: She is oriented to person, place, and time. She appears well-developed and well-nourished.  HENT:  Head: Normocephalic.  Right Ear: External ear normal.  Left Ear: External ear normal.  Mouth/Throat: Oropharynx is clear and moist.  Eyes: Conjunctivae and EOM are normal. Pupils are equal, round, and reactive to light.  Neck: Normal range of motion. Neck supple. No thyromegaly present.  Cardiovascular: Normal rate, regular rhythm, normal heart sounds and intact distal pulses.   Pulmonary/Chest: Effort normal.  Few scattered rhonchi and faint and expiratory wheezes  Abdominal: Soft. Bowel sounds are normal. She exhibits no mass. There is no tenderness.  Musculoskeletal: Normal range of motion.  Lymphadenopathy:    She has no cervical adenopathy.  Neurological: She is alert and oriented to person, place, and time.  Skin: Skin is warm and dry. No rash noted.  Psychiatric: She has a normal mood and affect. Her behavior is normal.          Assessment & Plan:   Viral bronchitis with cough. Will treat symptomatically Hypertension well controlled  dyslipidemia. Continue with atorvastatin Chronic back pain. Medications refilled  CPX 6 months

## 2013-09-28 NOTE — Patient Instructions (Signed)
Limit your sodium (Salt) intake  Acute bronchitis symptoms for less than 10 days are generally not helped by antibiotics.  Take over-the-counter expectorants and cough medications such as  Mucinex DM.  Call if there is no improvement in 5 to 7 days or if he developed worsening cough, fever, or new symptoms, such as shortness of breath or chest pain.

## 2013-10-03 ENCOUNTER — Emergency Department (HOSPITAL_COMMUNITY)
Admission: EM | Admit: 2013-10-03 | Discharge: 2013-10-03 | Disposition: A | Discharge status: Home / Self Care | Payer: Medicare Other | Attending: Emergency Medicine | Admitting: Emergency Medicine

## 2013-10-03 ENCOUNTER — Emergency Department (HOSPITAL_COMMUNITY): Payer: Medicare Other

## 2013-10-03 ENCOUNTER — Telehealth: Payer: Self-pay | Admitting: Internal Medicine

## 2013-10-03 ENCOUNTER — Encounter (HOSPITAL_COMMUNITY): Payer: Self-pay | Admitting: Emergency Medicine

## 2013-10-03 DIAGNOSIS — I10 Essential (primary) hypertension: Secondary | ICD-10-CM | POA: Insufficient documentation

## 2013-10-03 DIAGNOSIS — Z90710 Acquired absence of both cervix and uterus: Secondary | ICD-10-CM | POA: Diagnosis not present

## 2013-10-03 DIAGNOSIS — E039 Hypothyroidism, unspecified: Secondary | ICD-10-CM | POA: Insufficient documentation

## 2013-10-03 DIAGNOSIS — F329 Major depressive disorder, single episode, unspecified: Secondary | ICD-10-CM | POA: Diagnosis not present

## 2013-10-03 DIAGNOSIS — G4700 Insomnia, unspecified: Secondary | ICD-10-CM | POA: Diagnosis not present

## 2013-10-03 DIAGNOSIS — G8929 Other chronic pain: Secondary | ICD-10-CM | POA: Diagnosis not present

## 2013-10-03 DIAGNOSIS — R112 Nausea with vomiting, unspecified: Secondary | ICD-10-CM | POA: Diagnosis not present

## 2013-10-03 DIAGNOSIS — K219 Gastro-esophageal reflux disease without esophagitis: Secondary | ICD-10-CM | POA: Insufficient documentation

## 2013-10-03 DIAGNOSIS — R197 Diarrhea, unspecified: Secondary | ICD-10-CM | POA: Insufficient documentation

## 2013-10-03 DIAGNOSIS — F411 Generalized anxiety disorder: Secondary | ICD-10-CM | POA: Insufficient documentation

## 2013-10-03 DIAGNOSIS — R1032 Left lower quadrant pain: Secondary | ICD-10-CM | POA: Insufficient documentation

## 2013-10-03 DIAGNOSIS — F3289 Other specified depressive episodes: Secondary | ICD-10-CM | POA: Insufficient documentation

## 2013-10-03 DIAGNOSIS — Z79899 Other long term (current) drug therapy: Secondary | ICD-10-CM | POA: Insufficient documentation

## 2013-10-03 DIAGNOSIS — M545 Low back pain, unspecified: Secondary | ICD-10-CM | POA: Insufficient documentation

## 2013-10-03 DIAGNOSIS — Z9089 Acquired absence of other organs: Secondary | ICD-10-CM | POA: Insufficient documentation

## 2013-10-03 DIAGNOSIS — K625 Hemorrhage of anus and rectum: Secondary | ICD-10-CM | POA: Insufficient documentation

## 2013-10-03 DIAGNOSIS — IMO0002 Reserved for concepts with insufficient information to code with codable children: Secondary | ICD-10-CM | POA: Insufficient documentation

## 2013-10-03 DIAGNOSIS — R6889 Other general symptoms and signs: Secondary | ICD-10-CM | POA: Diagnosis not present

## 2013-10-03 DIAGNOSIS — R42 Dizziness and giddiness: Secondary | ICD-10-CM | POA: Insufficient documentation

## 2013-10-03 DIAGNOSIS — E785 Hyperlipidemia, unspecified: Secondary | ICD-10-CM | POA: Diagnosis not present

## 2013-10-03 DIAGNOSIS — R109 Unspecified abdominal pain: Secondary | ICD-10-CM | POA: Diagnosis not present

## 2013-10-03 DIAGNOSIS — R5381 Other malaise: Secondary | ICD-10-CM | POA: Diagnosis not present

## 2013-10-03 LAB — COMPREHENSIVE METABOLIC PANEL
ALT: 15 U/L (ref 0–35)
AST: 20 U/L (ref 0–37)
Albumin: 4 g/dL (ref 3.5–5.2)
Alkaline Phosphatase: 128 U/L — ABNORMAL HIGH (ref 39–117)
BUN: 9 mg/dL (ref 6–23)
CO2: 30 mEq/L (ref 19–32)
Calcium: 10.6 mg/dL — ABNORMAL HIGH (ref 8.4–10.5)
Chloride: 98 mEq/L (ref 96–112)
Creatinine, Ser: 0.82 mg/dL (ref 0.50–1.10)
GFR calc Af Amer: 79 mL/min — ABNORMAL LOW (ref >90)
GFR calc non Af Amer: 68 mL/min — ABNORMAL LOW (ref >90)
Glucose, Bld: 121 mg/dL — ABNORMAL HIGH (ref 70–99)
Potassium: 4.2 mEq/L (ref 3.5–5.1)
Sodium: 139 mEq/L (ref 135–145)
Total Bilirubin: 0.3 mg/dL (ref 0.3–1.2)
Total Protein: 7.9 g/dL (ref 6.0–8.3)

## 2013-10-03 LAB — URINALYSIS, ROUTINE W REFLEX MICROSCOPIC
Bilirubin Urine: NEGATIVE
Glucose, UA: NEGATIVE mg/dL
Hgb urine dipstick: NEGATIVE
Ketones, ur: NEGATIVE mg/dL
Nitrite: NEGATIVE
Protein, ur: NEGATIVE mg/dL
Specific Gravity, Urine: 1.014 (ref 1.005–1.030)
Urobilinogen, UA: 0.2 mg/dL (ref 0.0–1.0)
pH: 7.5 (ref 5.0–8.0)

## 2013-10-03 LAB — CBC WITH DIFFERENTIAL/PLATELET
Basophils Absolute: 0 10*3/uL (ref 0.0–0.1)
Basophils Relative: 0 % (ref 0–1)
Eosinophils Absolute: 0.1 10*3/uL (ref 0.0–0.7)
Eosinophils Relative: 1 % (ref 0–5)
HCT: 40.6 % (ref 36.0–46.0)
Hemoglobin: 13.6 g/dL (ref 12.0–15.0)
Lymphocytes Relative: 21 % (ref 12–46)
Lymphs Abs: 1.9 10*3/uL (ref 0.7–4.0)
MCH: 30.8 pg (ref 26.0–34.0)
MCHC: 33.5 g/dL (ref 30.0–36.0)
MCV: 91.9 fL (ref 78.0–100.0)
Monocytes Absolute: 0.6 10*3/uL (ref 0.1–1.0)
Monocytes Relative: 6 % (ref 3–12)
Neutro Abs: 6.6 10*3/uL (ref 1.7–7.7)
Neutrophils Relative %: 72 % (ref 43–77)
Platelets: 373 10*3/uL (ref 150–400)
RBC: 4.42 MIL/uL (ref 3.87–5.11)
RDW: 12.8 % (ref 11.5–15.5)
WBC: 9.2 10*3/uL (ref 4.0–10.5)

## 2013-10-03 LAB — URINE MICROSCOPIC-ADD ON

## 2013-10-03 LAB — LIPASE, BLOOD: Lipase: 22 U/L (ref 11–59)

## 2013-10-03 MED ORDER — DOXYCYCLINE HYCLATE 100 MG PO CAPS: 100.0000 mg | ORAL_CAPSULE | Freq: Two times a day (BID) | ORAL | Status: DC

## 2013-10-03 MED ORDER — ONDANSETRON HCL 4 MG/2ML IJ SOLN
4.0000 mg | Freq: Once | INTRAMUSCULAR | Status: AC
  Administered 2013-10-03: 4 mg via INTRAVENOUS
  Filled 2013-10-03: qty 2

## 2013-10-03 MED ORDER — OXYCODONE-ACETAMINOPHEN 5-325 MG PO TABS: 1.0000 | ORAL_TABLET | ORAL | Status: DC | PRN

## 2013-10-03 MED ORDER — ONDANSETRON HCL 4 MG PO TABS: 4.0000 mg | ORAL_TABLET | Freq: Four times a day (QID) | ORAL | Status: DC

## 2013-10-03 MED ORDER — OXYCODONE-ACETAMINOPHEN 5-325 MG PO TABS
1.0000 | ORAL_TABLET | Freq: Once | ORAL | Status: AC
  Administered 2013-10-03: 1 via ORAL
  Filled 2013-10-03: qty 1

## 2013-10-03 MED ORDER — SODIUM CHLORIDE 0.9 % IV SOLN
Freq: Once | INTRAVENOUS | Status: AC
  Administered 2013-10-03: 10:00:00 via INTRAVENOUS

## 2013-10-03 MED ORDER — IOHEXOL 300 MG/ML  SOLN
50.0000 mL | Freq: Once | INTRAMUSCULAR | Status: AC | PRN
  Administered 2013-10-03: 50 mL via ORAL

## 2013-10-03 MED ORDER — SACCHAROMYCES BOULARDII 250 MG PO CAPS: 250.0000 mg | ORAL_CAPSULE | Freq: Two times a day (BID) | ORAL | Status: DC

## 2013-10-03 MED ORDER — MORPHINE SULFATE 4 MG/ML IJ SOLN
4.0000 mg | Freq: Once | INTRAMUSCULAR | Status: AC
  Administered 2013-10-03: 4 mg via INTRAVENOUS
  Filled 2013-10-03: qty 1

## 2013-10-03 MED ORDER — IOHEXOL 300 MG/ML  SOLN
100.0000 mL | Freq: Once | INTRAMUSCULAR | Status: AC | PRN
  Administered 2013-10-03: 100 mL via INTRAVENOUS

## 2013-10-03 NOTE — Telephone Encounter (Signed)
Pt states she was given Symbicort samples at ov last week.  Pharmacy needs to know the dosage pt was given.

## 2013-10-03 NOTE — Telephone Encounter (Signed)
Please advise 

## 2013-10-03 NOTE — ED Provider Notes (Signed)
CSN: [id number]     Arrival date & time 10/03/13  [id number] History   First MD Initiated Contact with Patient 10/03/13 0932     Chief Complaint  Patient presents with  . Abdominal Pain  . Rectal Bleeding   (Consider location/radiation/quality/duration/timing/severity/associated sxs/prior Treatment) Patient is a 78 y.o. female presenting with abdominal pain and hematochezia. The history is provided by the patient. No language interpreter was used.  Abdominal Pain Pain location:  LLQ Pain quality: bloating, cramping and sharp   Pain severity:  Severe Onset quality:  Gradual Progression:  Worsening Associated symptoms: diarrhea, hematochezia, nausea and vomiting   Associated symptoms: no chest pain, no dysuria, no fever and no shortness of breath   Associated symptoms comment:  She is having abdominal pain and generalized weakness for the past 2 days. No fever. She has had nausea with limited vomiting, diarrhea with blood present. She has a history of crohn's disease and feels this is familiar to her as a flare up. She states the last exacerbation was years ago.  Rectal Bleeding Associated symptoms: abdominal pain, dizziness and vomiting   Associated symptoms: no fever     Past Medical History  Diagnosis Date  . ANXIETY 07/23/2009  . DEPRESSION 07/23/2009  . DIVERTICULOSIS, COLON 07/23/2009  . GERD 07/23/2009  . Headache(784.0) 07/23/2009  . HIP PAIN, LEFT 05/16/2010  . HYPERLIPIDEMIA 07/23/2009  . HYPERTENSION 07/23/2009  . HYPOTHYROIDISM 07/23/2009  . LOW BACK PAIN, CHRONIC 10/01/2009  . PARESTHESIA 10/01/2009  . IBS (irritable bowel syndrome)   . History of Crohn's disease   . Chronic pain   . Hypothyroidism   . Insomnia    Past Surgical History  Procedure Laterality Date  . Appendectomy    . Abdominal hysterectomy    . Tonsillectomy     History reviewed. No pertinent family history. History  Substance Use Topics  . Smoking status: Never Smoker   . Smokeless tobacco: Never Used   . Alcohol Use: No   OB History   Grav Para Term Preterm Abortions TAB SAB Ect Mult Living                 Review of Systems  Constitutional: Negative for fever.  Respiratory: Negative.  Negative for shortness of breath.   Cardiovascular: Negative.  Negative for chest pain.  Gastrointestinal: Positive for nausea, vomiting, abdominal pain, diarrhea, blood in stool and hematochezia.  Genitourinary: Negative.  Negative for dysuria.  Musculoskeletal: Negative.   Neurological: Positive for dizziness and weakness.  Psychiatric/Behavioral: Negative for confusion.    Allergies  Duloxetine; Metronidazole; and Rofecoxib  Home Medications   Current Outpatient Rx  Name  Route  Sig  Dispense  Refill  . atorvastatin (LIPITOR) 20 MG tablet   Oral   Take 20 mg by mouth daily after supper.         . Budesonide-Formoterol Fumarate (SYMBICORT IN)   Inhalation   Inhale 1 puff into the lungs 2 (two) times daily.         . clorazepate (TRANXENE) 7.5 MG tablet   Oral   Take 7.5 mg by mouth 2 (two) times daily as needed for anxiety.         . Cyanocobalamin (VITAMIN B 12 PO)   Oral   Take 1 tablet by mouth at bedtime.         [name] Dextromethorphan-Guaifenesin (MUCINEX DM) 30-600 MG TB12   Oral   Take 1 tablet by mouth every 12 (twelve) hours as needed (For congestion.).         [name]  diltiazem (CARTIA XT) 240 MG 24 hr capsule   Oral   Take 240 mg by mouth every morning.         [name] esomeprazole (NEXIUM) 40 MG capsule   Oral   Take 40 mg by mouth daily before breakfast.         . furosemide (LASIX) 20 MG tablet   Oral   Take 20 mg by mouth daily as needed for fluid.         [name] levothyroxine (SYNTHROID, LEVOTHROID) 25 MCG tablet   Oral   Take 25 mcg by mouth daily before breakfast.         . Polyethyl Glycol-Propyl Glycol (SYSTANE OP)   Both Eyes   Place 1 drop into both eyes 2 (two) times daily.          . polyethylene glycol powder (GLYCOLAX/MIRALAX) powder    Oral   Take 17 g by mouth every morning.          . pregabalin (LYRICA) 50 MG capsule   Oral   Take 50 mg by mouth 3 (three) times daily.         . promethazine (PHENERGAN) 25 MG tablet   Oral   Take 25 mg by mouth every 8 (eight) hours as needed for nausea.         . sertraline (ZOLOFT) 25 MG tablet   Oral   Take 25 mg by mouth every morning.         . traMADol (ULTRAM) 50 MG tablet   Oral   Take 50 mg by mouth every 6 (six) hours as needed for pain.         [name] zolpidem (AMBIEN) 5 MG tablet   Oral   Take 5 mg by mouth every other day.          BP 150/59  Pulse 89  Temp(Src) 98.7 F (37.1 C) (Oral)  Resp 18  SpO2 94% Physical Exam  Constitutional: She is oriented to person, place, and time. She appears well-developed and well-nourished.  HENT:  Head: Normocephalic.  Neck: Normal range of motion. Neck supple.  Cardiovascular: Normal rate and regular rhythm.   Pulmonary/Chest: Effort normal and breath sounds normal.  Abdominal: Soft. Bowel sounds are normal. She exhibits no mass. There is tenderness. There is no rebound and no guarding.  Diffuse tenderness, worse in the LLQ.  Musculoskeletal: Normal range of motion.  Neurological: She is alert and oriented to person, place, and time.  Skin: Skin is warm and dry. No rash noted.  Psychiatric: She has a normal mood and affect.    ED Course  Procedures (including critical care time) Labs Review Labs Reviewed  URINE CULTURE  CBC WITH DIFFERENTIAL  COMPREHENSIVE METABOLIC PANEL  LIPASE, BLOOD  URINALYSIS, ROUTINE W REFLEX MICROSCOPIC   Results for orders placed during the hospital encounter of 10/03/13  CBC WITH DIFFERENTIAL      Result Value Range   WBC 9.2  4.0 - 10.5 K/uL   RBC 4.42  3.87 - 5.11 MIL/uL   Hemoglobin 13.6  12.0 - 15.0 g/dL   HCT 40.6  36.0 - 46.0 %   MCV 91.9  78.0 - 100.0 fL   MCH 30.8  26.0 - 34.0 pg   MCHC 33.5  30.0 - 36.0 g/dL   RDW 12.8  11.5 - 15.5 %   Platelets 373  150 -  400 K/uL   Neutrophils Relative % 72  43 - 77 %  Neutro Abs 6.6  1.7 - 7.7 K/uL   Lymphocytes Relative 21  12 - 46 %   Lymphs Abs 1.9  0.7 - 4.0 K/uL   Monocytes Relative 6  3 - 12 %   Monocytes Absolute 0.6  0.1 - 1.0 K/uL   Eosinophils Relative 1  0 - 5 %   Eosinophils Absolute 0.1  0.0 - 0.7 K/uL   Basophils Relative 0  0 - 1 %   Basophils Absolute 0.0  0.0 - 0.1 K/uL  COMPREHENSIVE METABOLIC PANEL      Result Value Range   Sodium 139  135 - 145 mEq/L   Potassium 4.2  3.5 - 5.1 mEq/L   Chloride 98  96 - 112 mEq/L   CO2 30  19 - 32 mEq/L   Glucose, Bld 121 (*) 70 - 99 mg/dL   BUN 9  6 - 23 mg/dL   Creatinine, Ser 0.82  0.50 - 1.10 mg/dL   Calcium 10.6 (*) 8.4 - 10.5 mg/dL   Total Protein 7.9  6.0 - 8.3 g/dL   Albumin 4.0  3.5 - 5.2 g/dL   AST 20  0 - 37 U/L   ALT 15  0 - 35 U/L   Alkaline Phosphatase 128 (*) 39 - 117 U/L   Total Bilirubin 0.3  0.3 - 1.2 mg/dL   GFR calc non Af Amer 68 (*) >90 mL/min   GFR calc Af Amer 79 (*) >90 mL/min  LIPASE, BLOOD      Result Value Range   Lipase 22  11 - 59 U/L  URINALYSIS, ROUTINE W REFLEX MICROSCOPIC      Result Value Range   Color, Urine YELLOW  YELLOW   APPearance CLOUDY (*) CLEAR   Specific Gravity, Urine 1.014  1.005 - 1.030   pH 7.5  5.0 - 8.0   Glucose, UA NEGATIVE  NEGATIVE mg/dL   Hgb urine dipstick NEGATIVE  NEGATIVE   Bilirubin Urine NEGATIVE  NEGATIVE   Ketones, ur NEGATIVE  NEGATIVE mg/dL   Protein, ur NEGATIVE  NEGATIVE mg/dL   Urobilinogen, UA 0.2  0.0 - 1.0 mg/dL   Nitrite NEGATIVE  NEGATIVE   Leukocytes, UA MODERATE (*) NEGATIVE  URINE MICROSCOPIC-ADD ON      Result Value Range   Squamous Epithelial / LPF FEW (*) RARE   WBC, UA 7-10  <3 WBC/hpf   RBC / HPF 0-2  <3 RBC/hpf   Bacteria, UA MANY (*) RARE   Ct Abdomen Pelvis W Contrast  10/03/2013   CLINICAL DATA:  Lower abdominal pain. Rectal bleeding.  EXAM: CT ABDOMEN AND PELVIS WITH CONTRAST  TECHNIQUE: Multidetector CT imaging of the abdomen and pelvis  was performed using the standard protocol following bolus administration of intravenous contrast.  CONTRAST:  120mL OMNIPAQUE IOHEXOL 300 MG/ML  SOLN  COMPARISON:  11/19/2007  FINDINGS: Diffuse hepatic steatosis.  Gallbladder, spleen, pancreas, adrenal glands are within normal limits.  Vascular calcifications within the central left kidney. Tiny hypodensities in the kidneys are likely cysts. They are stable bilaterally.  Borderline enlarged hepatoduodenal ligament lymph nodes are stable.  Extensive sigmoid diverticulosis without definitive evidence of acute diverticulitis.  Bladder is distended.  Retro aortic left renal vein anatomy.  No free-fluid.  Uterus is absent. Adnexa are unremarkable.  Broad-based disk protrusion at L5-S1 this does narrow the lateral recesses somewhat.  Atherosclerotic calcifications of the visceral vasculature arm minimal.  No acute bony deformity.  IMPRESSION: No acute intra-abdominal pathology.   Electronically Signed  By: Maryclare Bean M.D.   On: 10/03/2013 12:37   Imaging Review No results found.  EKG Interpretation   None       MDM  No diagnosis found. 1. Abdominal pain  Pain has been improved with medications. VSS and have remained stable. No diarrhea or vomiting in ED - tolerated CT CM without complaint of nausea.   Discussed with Dr. [name]. Advises Doxycycline for the next 7 days, a probiotic as well. He will follow up in the office. Stable for discharge. Dr. Wilson Singer has seen patient and agrees with care plan.  Dewaine Oats, PA-C 10/03/13 1514

## 2013-10-03 NOTE — ED Notes (Signed)
Pt states abd pain and bleeding started on Friday night. Pt complains of weakness and dizziness.

## 2013-10-03 NOTE — ED Notes (Addendum)
Per EMs. PT complains of abd pain and blood in diarrhea. Pt has hx of crohns, and feels like this is a flareup. Also complains of trouble swallowing, hx of esophagus stretched 15 years ago. EMS states pt slightly anxious

## 2013-10-04 LAB — URINE CULTURE: Colony Count: 7000

## 2013-10-04 NOTE — Telephone Encounter (Signed)
Samples will be sufficient. Patient will not need a long-term prescription

## 2013-10-04 NOTE — ED Provider Notes (Signed)
Medical screening examination/treatment/procedure(s) were conducted as a shared visit with non-physician practitioner(s) and myself.  I personally evaluated the patient during the encounter.  EKG Interpretation   None      78yF with abdominal pain and additionally dysphagia. Hx of crohn's and what sound like esophageal stricture? With previous dilation years ago. Ct w/o concerning pathology. Case discussed with GI by [place], PA-C. Meds per recommendations and outpt FU.   Virgel Manifold, MD 10/04/13 [phone]

## 2013-10-04 NOTE — Telephone Encounter (Signed)
Spoke to pt asked her what dosage of Symbicort she was given and what happened? Pt stated dosage 80/4.5 and felt like my tongue was swollen. Asked pt if she stopped using the inhaler? Pt stated yes immediately. Told pt okay, will had to allergy list and let pharmacy know the dosage because they called.  Pt verbalized understanding.  [place] and spoke to pharmacist she said Angela Nevin was not there and she was not sure what she wanted but being as pt is not in the ED any longer to disregard. Told her okay.

## 2013-10-10 ENCOUNTER — Other Ambulatory Visit: Payer: Self-pay | Admitting: Internal Medicine

## 2013-10-19 DIAGNOSIS — R1084 Generalized abdominal pain: Secondary | ICD-10-CM | POA: Diagnosis not present

## 2013-10-19 DIAGNOSIS — R110 Nausea: Secondary | ICD-10-CM | POA: Diagnosis not present

## 2013-10-19 DIAGNOSIS — K591 Functional diarrhea: Secondary | ICD-10-CM | POA: Diagnosis not present

## 2013-10-27 ENCOUNTER — Other Ambulatory Visit: Payer: Self-pay | Admitting: Internal Medicine

## 2013-10-27 ENCOUNTER — Encounter (HOSPITAL_COMMUNITY): Payer: Self-pay | Admitting: *Deleted

## 2013-10-27 ENCOUNTER — Other Ambulatory Visit: Payer: Self-pay | Admitting: *Deleted

## 2013-10-27 MED ORDER — CLORAZEPATE DIPOTASSIUM 7.5 MG PO TABS: 7.5000 mg | ORAL_TABLET | Freq: Two times a day (BID) | ORAL | Status: DC | PRN

## 2013-11-01 ENCOUNTER — Encounter (HOSPITAL_COMMUNITY): Payer: Self-pay | Admitting: Pharmacist

## 2013-11-07 ENCOUNTER — Other Ambulatory Visit: Payer: Self-pay | Admitting: Internal Medicine

## 2013-11-15 ENCOUNTER — Other Ambulatory Visit: Payer: Self-pay | Admitting: Internal Medicine

## 2013-11-17 ENCOUNTER — Ambulatory Visit (HOSPITAL_COMMUNITY)
Admission: RE | Admit: 2013-11-17 | Discharge: 2013-11-17 | Disposition: A | Discharge status: Home / Self Care | Payer: Medicare Other | Source: Ambulatory Visit | Attending: Gastroenterology | Admitting: Gastroenterology

## 2013-11-17 ENCOUNTER — Encounter (HOSPITAL_COMMUNITY): Payer: Medicare Other | Admitting: Anesthesiology

## 2013-11-17 ENCOUNTER — Encounter (HOSPITAL_COMMUNITY)
Admission: RE | Disposition: A | Discharge status: Home / Self Care | Payer: Self-pay | Source: Ambulatory Visit | Attending: Gastroenterology

## 2013-11-17 ENCOUNTER — Ambulatory Visit (HOSPITAL_COMMUNITY): Payer: Medicare Other | Admitting: Anesthesiology

## 2013-11-17 ENCOUNTER — Encounter (HOSPITAL_COMMUNITY): Payer: Self-pay | Admitting: *Deleted

## 2013-11-17 DIAGNOSIS — I10 Essential (primary) hypertension: Secondary | ICD-10-CM | POA: Diagnosis not present

## 2013-11-17 DIAGNOSIS — K5730 Diverticulosis of large intestine without perforation or abscess without bleeding: Secondary | ICD-10-CM | POA: Insufficient documentation

## 2013-11-17 DIAGNOSIS — K2940 Chronic atrophic gastritis without bleeding: Secondary | ICD-10-CM | POA: Insufficient documentation

## 2013-11-17 DIAGNOSIS — D126 Benign neoplasm of colon, unspecified: Secondary | ICD-10-CM | POA: Insufficient documentation

## 2013-11-17 DIAGNOSIS — Z79899 Other long term (current) drug therapy: Secondary | ICD-10-CM | POA: Insufficient documentation

## 2013-11-17 DIAGNOSIS — E785 Hyperlipidemia, unspecified: Secondary | ICD-10-CM | POA: Diagnosis not present

## 2013-11-17 DIAGNOSIS — K589 Irritable bowel syndrome without diarrhea: Secondary | ICD-10-CM | POA: Diagnosis not present

## 2013-11-17 DIAGNOSIS — K5090 Crohn's disease, unspecified, without complications: Secondary | ICD-10-CM | POA: Insufficient documentation

## 2013-11-17 DIAGNOSIS — R110 Nausea: Secondary | ICD-10-CM | POA: Diagnosis not present

## 2013-11-17 DIAGNOSIS — K219 Gastro-esophageal reflux disease without esophagitis: Secondary | ICD-10-CM | POA: Insufficient documentation

## 2013-11-17 DIAGNOSIS — K2960 Other gastritis without bleeding: Secondary | ICD-10-CM | POA: Diagnosis not present

## 2013-11-17 DIAGNOSIS — E039 Hypothyroidism, unspecified: Secondary | ICD-10-CM | POA: Insufficient documentation

## 2013-11-17 DIAGNOSIS — R197 Diarrhea, unspecified: Secondary | ICD-10-CM | POA: Insufficient documentation

## 2013-11-17 DIAGNOSIS — R198 Other specified symptoms and signs involving the digestive system and abdomen: Secondary | ICD-10-CM | POA: Diagnosis not present

## 2013-11-17 DIAGNOSIS — R633 Feeding difficulties, unspecified: Secondary | ICD-10-CM | POA: Diagnosis not present

## 2013-11-17 HISTORY — DX: Unspecified hemorrhoids: K64.9

## 2013-11-17 HISTORY — PX: ESOPHAGOGASTRODUODENOSCOPY: SHX5428

## 2013-11-17 HISTORY — PX: COLONOSCOPY: SHX5424

## 2013-11-17 HISTORY — DX: Other complications of anesthesia, initial encounter: T88.59XA

## 2013-11-17 HISTORY — DX: Zoster without complications: B02.9

## 2013-11-17 HISTORY — DX: Adverse effect of unspecified anesthetic, initial encounter: T41.45XA

## 2013-11-17 LAB — BASIC METABOLIC PANEL
BUN: 10 mg/dL (ref 6–23)
CO2: 25 mEq/L (ref 19–32)
Calcium: 9.7 mg/dL (ref 8.4–10.5)
Chloride: 100 mEq/L (ref 96–112)
Creatinine, Ser: 1 mg/dL (ref 0.50–1.10)
GFR calc Af Amer: 62 mL/min — ABNORMAL LOW (ref >90)
GFR calc non Af Amer: 53 mL/min — ABNORMAL LOW (ref >90)
Glucose, Bld: 108 mg/dL — ABNORMAL HIGH (ref 70–99)
Potassium: 3.9 mEq/L (ref 3.5–5.1)
Sodium: 139 mEq/L (ref 135–145)

## 2013-11-17 LAB — HEMOGLOBIN AND HEMATOCRIT, BLOOD
HCT: 46.9 % — ABNORMAL HIGH (ref 36.0–46.0)
Hemoglobin: 15.7 g/dL — ABNORMAL HIGH (ref 12.0–15.0)

## 2013-11-17 SURGERY — EGD (ESOPHAGOGASTRODUODENOSCOPY)
Anesthesia: Monitor Anesthesia Care

## 2013-11-17 MED ORDER — FENTANYL CITRATE 0.05 MG/ML IJ SOLN
INTRAMUSCULAR | Status: AC
  Filled 2013-11-17: qty 2

## 2013-11-17 MED ORDER — GLYCOPYRROLATE 0.2 MG/ML IJ SOLN
INTRAMUSCULAR | Status: AC
  Filled 2013-11-17: qty 1

## 2013-11-17 MED ORDER — LIDOCAINE HCL (CARDIAC) 20 MG/ML IV SOLN
INTRAVENOUS | Status: DC | PRN
  Administered 2013-11-17: 40 mg via INTRAVENOUS

## 2013-11-17 MED ORDER — SODIUM CHLORIDE 0.9 % IV SOLN: INTRAVENOUS | Status: DC

## 2013-11-17 MED ORDER — PROPOFOL 10 MG/ML IV BOLUS
INTRAVENOUS | Status: AC
  Filled 2013-11-17: qty 20

## 2013-11-17 MED ORDER — PROPOFOL 10 MG/ML IV BOLUS
INTRAVENOUS | Status: DC | PRN
  Administered 2013-11-17: 40 mg via INTRAVENOUS

## 2013-11-17 MED ORDER — SPOT INK MARKER SYRINGE KIT
PACK | SUBMUCOSAL | Status: DC | PRN
  Administered 2013-11-17: .5 mL via SUBMUCOSAL

## 2013-11-17 MED ORDER — MIDAZOLAM HCL 5 MG/5ML IJ SOLN
INTRAMUSCULAR | Status: DC | PRN
  Administered 2013-11-17: 1 mg via INTRAVENOUS

## 2013-11-17 MED ORDER — LACTATED RINGERS IV SOLN
INTRAVENOUS | Status: DC | PRN
  Administered 2013-11-17: 12:00:00 via INTRAVENOUS

## 2013-11-17 MED ORDER — MIDAZOLAM HCL 2 MG/2ML IJ SOLN
INTRAMUSCULAR | Status: AC
  Filled 2013-11-17: qty 2

## 2013-11-17 MED ORDER — FENTANYL CITRATE 0.05 MG/ML IJ SOLN
INTRAMUSCULAR | Status: DC | PRN
  Administered 2013-11-17: 50 ug via INTRAVENOUS

## 2013-11-17 MED ORDER — GLYCOPYRROLATE 0.2 MG/ML IJ SOLN
INTRAMUSCULAR | Status: DC | PRN
  Administered 2013-11-17: 0.2 mg via INTRAVENOUS

## 2013-11-17 MED ORDER — SPOT INK MARKER SYRINGE KIT
PACK | SUBMUCOSAL | Status: AC
  Filled 2013-11-17: qty 5

## 2013-11-17 MED ORDER — PROPOFOL INFUSION 10 MG/ML OPTIME
INTRAVENOUS | Status: DC | PRN
  Administered 2013-11-17: 180 ug/kg/min via INTRAVENOUS

## 2013-11-17 NOTE — Transfer of Care (Signed)
Immediate Anesthesia Transfer of Care Note  Patient: Heather [name]  Procedure(s) Performed: Procedure(s): ESOPHAGOGASTRODUODENOSCOPY (EGD) (N/A) COLONOSCOPY (N/A)  Patient Location: PACU  Anesthesia Type:MAC  Level of Consciousness: awake, alert , oriented and patient cooperative  Airway & Oxygen Therapy: Patient Spontanous Breathing and Patient connected to nasal cannula oxygen  Post-op Assessment: Report given to PACU RN, Post -op Vital signs reviewed and stable and Patient moving all extremities X 4  Post vital signs: stable  Complications: No apparent anesthesia complications

## 2013-11-17 NOTE — Anesthesia Preprocedure Evaluation (Signed)
Anesthesia Evaluation  Patient identified by MRN, date of birth, ID band Patient awake    Reviewed: Allergy & Precautions, H&P , NPO status , Patient's Chart, lab work & pertinent test results  History of Anesthesia Complications Negative for: history of anesthetic complications  Airway Mallampati: II TM Distance: >3 FB Neck ROM: Full    Dental no notable dental hx. (+) Partial Upper   Pulmonary neg pulmonary ROS,  breath sounds clear to auscultation  Pulmonary exam normal       Cardiovascular hypertension, negative cardio ROS  Rhythm:Regular Rate:Normal     Neuro/Psych negative neurological ROS  negative psych ROS   GI/Hepatic negative GI ROS, Neg liver ROS,   Endo/Other  negative endocrine ROS  Renal/GU negative Renal ROS  negative genitourinary   Musculoskeletal negative musculoskeletal ROS (+)   Abdominal   Peds negative pediatric ROS (+)  Hematology negative hematology ROS (+)   Anesthesia Other Findings   Reproductive/Obstetrics negative OB ROS                           Anesthesia Physical Anesthesia Plan  ASA: II  Anesthesia Plan: MAC   Post-op Pain Management:    Induction:   Airway Management Planned:   Additional Equipment:   Intra-op Plan:   Post-operative Plan:   Informed Consent: I have reviewed the patients History and Physical, chart, labs and discussed the procedure including the risks, benefits and alternatives for the proposed anesthesia with the patient or authorized representative who has indicated his/her understanding and acceptance.   Dental advisory given  Plan Discussed with: CRNA  Anesthesia Plan Comments:         Anesthesia Quick Evaluation

## 2013-11-17 NOTE — Op Note (Signed)
[facility] [place], [address]   COLONOSCOPY PROCEDURE REPORT  PATIENT: Heather [name], Heather [name]  MR#: [id number] BIRTHDATE: 10-05-1935 , 28  yrs. old GENDER: Female ENDOSCOPIST: [name], MD REFERRED BY:   Dr. Bluford Kaufmann PROCEDURE DATE:  11/17/2013 PROCEDURE:     colonoscopy with biopsies and directed submucosal injection ASA CLASS: INDICATIONS:  irregularity of bowel habit, with recent diarrhea in a patient with a previous history of collagenous colitis (biopsies negative for that condition on her most recent colonoscopy 7 years ago) MEDICATIONS:    MAC per anesthesia  DESCRIPTION OF PROCEDURE: the patient came as an outpatient to the [facility] long endoscopy unit and provided written consent. This procedure was performed immediately prior to her upper endoscopy. Time out that been performed already.  The Pentax adult video colonoscope was quite easily advanced around the colon to the area just above the cecum, where upon abdominal compression facilitate passage of the scope into the base of the cecum which was identified by the absence of further lumen and are rather indistinct appendiceal orifice, as well as the appearance of the ileal cecal valve. I did not enter the terminal ileum.  The quality of the prep was excellent in its felt that all areas were well seen.  In the ascending colon there was an area of slightly irregular mucosa along a fold, possibly representing a flat, spreading polyp. Several biopsies were obtained from this area and injection of SPOT (Niger ink) to mark the location was then performed.  Random mucosal biopsies were obtained along the length of the colon.  There was no evidence of colitis or other mucosal abnormalities on this exam apart from the possible polyp mentioned above. The patient did have moderate scattered diverticulosis, predominantly left-sided.  I did not see any overt inflammatory  changes, polyps, or masses.  The rectum was too small to perform retroflexion, but antegrade viewing discloses no significant abnormalities. The patient did have some mild to moderate internal hemorrhoids within the anal canal.  The patient tolerated the procedure well.     COMPLICATIONS: None  ENDOSCOPIC IMPRESSION:  1. Scattered diverticulosis 2. Slightly irregular mucosa on a fold in the proximal colon, possibly representing a flat polyp, biopsies pending 3. No endoscopically-evident source of diarrhea seen   RECOMMENDATIONS:  Await pathology results. If they are negative (as anticipated) for evidence of recurrent collagenous colitis, I would favor treating this patient as for irritable bowel    _______________________________ eSignedRonald Lobo, MD 11/17/2013 1:56 PM     PATIENT NAME:  Heather [name], Heather [name] MR#: [id number]

## 2013-11-17 NOTE — Op Note (Signed)
[facility] [place], [address]   ENDOSCOPY PROCEDURE REPORT  PATIENT: Heather, [name]  MR#: [id number] BIRTHDATE: [birth year] , 78  yrs. old GENDER: Female ENDOSCOPIST:Alyzza Andringa, MD REFERRED BY:  Dr. Bluford Kaufmann PROCEDURE DATE:  11/17/2013 PROCEDURE:      upper endoscopy with biopsy ASA CLASS: INDICATIONS:   nausea, food intolerance MEDICATION:  MAC per anesthesia TOPICAL ANESTHETIC:    none  DESCRIPTION OF PROCEDURE:   the patient came as an outpatient to the [facility] long endoscopy unit and provided written consent. Time out was performed and the patient was sedated by anesthesia.  The Pentax adult video endoscope was passed under direct vision. The larynx the vocal cords looked normal. The esophagus was readily entered.  The esophagus was endoscopically normal, without evidence of reflux esophagitis, Barrett's esophagus, varices, infection, neoplasia, or any ring, stricture, or hiatal hernia.  The stomach was entered. It contained absolutely no residual, and specifically no bile. The gastric mucosa was normal, without evidence of any erythema or gastritis, nor any focal lesions such as erosions, ulcers, polyps, or masses. A retroflexed view of the cardia was normal.  The pylorus, duodenal bulb, and second duodenum looked normal. Random biopsies of the duodenum as well as the gastric antrum were obtained prior to removal of the scope.  The patient tolerated the procedure well.     COMPLICATIONS: None  ENDOSCOPIC IMPRESSION:  1. Normal endoscopy 2. No source of nausea or food intolerance endoscopically evident  RECOMMENDATIONS:  1. Await pathology results 2. Proceed to colonoscopic evaluation because of lower tract symptoms    _______________________________ eSigned:  [name], MD 11/17/2013 1:50 PM    PATIENT NAME:  Heather, [name] MR#: [id number]

## 2013-11-17 NOTE — Anesthesia Postprocedure Evaluation (Signed)
  Anesthesia Post-op Note  Patient: Heather [name]  Procedure(s) Performed: Procedure(s) (LRB): ESOPHAGOGASTRODUODENOSCOPY (EGD) (N/A) COLONOSCOPY (N/A)  Patient Location: PACU  Anesthesia Type: MAC  Level of Consciousness: awake and alert   Airway and Oxygen Therapy: Patient Spontanous Breathing  Post-op Pain: mild  Post-op Assessment: Post-op Vital signs reviewed, Patient's Cardiovascular Status Stable, Respiratory Function Stable, Patent Airway and No signs of Nausea or vomiting  Last Vitals:  Filed Vitals:   11/17/13 1340  BP: 123/63  Pulse:   Temp:   Resp:     Post-op Vital Signs: stable   Complications: No apparent anesthesia complications

## 2013-11-17 NOTE — H&P (Signed)
Heather [name] is an 78 y.o. female.   Chief Complaint: nausea and diarrhea HPI: presents today for endoscopy and colonoscopy, because of nausea with food intolerance as well as alteration of bowel habit with irregularity of bowel movements, tending toward diarrhea, in the setting of a previous history of collagenous colitis although her most recent colonoscopy, about 7 years ago, did not show colitis activity at that time she has also had left lower quadrant pain and rectal bleeding, for which reason evaluation at the emergency room was negative including a CT scan and blood work and in fact her stool was Hemoccult negative at that time  Past Medical History  Diagnosis Date  . ANXIETY 07/23/2009  . DEPRESSION 07/23/2009  . DIVERTICULOSIS, COLON 07/23/2009  . GERD 07/23/2009  . HIP PAIN, LEFT 05/16/2010  . HYPERLIPIDEMIA 07/23/2009  . HYPERTENSION 07/23/2009  . HYPOTHYROIDISM 07/23/2009  . LOW BACK PAIN, CHRONIC 10/01/2009  . PARESTHESIA 10/01/2009  . IBS (irritable bowel syndrome)   . History of Crohn's disease   . Chronic pain   . Hypothyroidism   . Insomnia   . Headache(784.0) 07/23/2009    occasional  . Shingles 2006    back  . Hemorrhoids   . Complication of anesthesia 7 or 8 yrs ago    woke up during colonscopy    Past Surgical History  Procedure Laterality Date  . Tonsillectomy  yrs ago  . Hemorrhoid surgery  yrs ago  . Appendectomy  yrs ago  . Bilateral salpingoophorectomy  age 46 or 75  . Abdominal hysterectomy  age 58 or 14  . Colonscopy  7 or 8 yrs ago    History reviewed. No pertinent family history. Social History:  reports that she has never smoked. She has never used smokeless tobacco. She reports that she does not drink alcohol or use illicit drugs.  Allergies:  Allergies  Allergen Reactions  . Symbicort [Budesonide-Formoterol Fumarate] Other (See Comments)    Pt felt like her tongue was swollen, could not swallow  . Duloxetine Other (See Comments)   Patient doesn't recall  . Metronidazole Other (See Comments)    Patient doesn't recall   . Rofecoxib Other (See Comments)    Patient doesn't recall     Medications Prior to Admission  Medication Sig Dispense Refill  . Cholecalciferol 2000 UNITS TABS Take 2,000 Units by mouth daily.      . clorazepate (TRANXENE) 7.5 MG tablet Take 1 tablet (7.5 mg total) by mouth 2 (two) times daily as needed for anxiety.  30 tablet  2  . Dextromethorphan-Guaifenesin (MUCINEX DM) 30-600 MG TB12 Take 1 tablet by mouth daily as needed (For congestion.).       [name] diltiazem (CARTIA XT) 240 MG 24 hr capsule Take 240 mg by mouth daily.      [name] esomeprazole (NEXIUM) 40 MG capsule Take 40 mg by mouth daily before breakfast.      . ferrous sulfate 325 (65 FE) MG tablet Take 325 mg by mouth daily with breakfast.      . levothyroxine (SYNTHROID, LEVOTHROID) 25 MCG tablet Take 25 mcg by mouth daily before breakfast.      . Polyethyl Glycol-Propyl Glycol (SYSTANE OP) Place 1 drop into both eyes 2 (two) times daily.       . polyethylene glycol powder (GLYCOLAX/MIRALAX) powder Take 17 g by mouth daily as needed for mild constipation.       . pregabalin (LYRICA) 50 MG capsule Take 50 mg by mouth 3 (  three) times daily.      . promethazine (PHENERGAN) 25 MG tablet Take 25 mg by mouth every 8 (eight) hours as needed for nausea.      . sertraline (ZOLOFT) 25 MG tablet Take 25 mg by mouth every morning.      . traMADol (ULTRAM) 50 MG tablet Take 50 mg by mouth every 6 (six) hours as needed for pain.      [name] zolpidem (AMBIEN) 5 MG tablet Take 5 mg by mouth at bedtime as needed.       . furosemide (LASIX) 20 MG tablet Take 20 mg by mouth daily as needed for fluid.      . promethazine (PHENERGAN) 25 MG tablet TAKE 1 TABLET BY MOUTH EVERY 8 HOURS AS NEEDED FOR NAUSEA  20 tablet  0    No results found for this or any previous visit (from the past 48 hour(s)). No results found.  ROS  Blood pressure 156/78, temperature 98.3 F  (36.8 C), temperature source Oral, resp. rate 18, SpO2 94.00%. Physical Exam  Chest, heart, and abdomen are unremarkable.  Pt is alert, NAD.  No pallor or icterus.  Assessment/Plan Endoscopy and colonoscopy today, with random mucosal biopsies  Asiel Chrostowski V 11/17/2013, 12:16 PM

## 2013-11-18 ENCOUNTER — Encounter (HOSPITAL_COMMUNITY): Payer: Self-pay | Admitting: Gastroenterology

## 2013-11-25 ENCOUNTER — Other Ambulatory Visit: Payer: Self-pay | Admitting: Internal Medicine

## 2013-12-04 ENCOUNTER — Other Ambulatory Visit: Payer: Self-pay | Admitting: Internal Medicine

## 2013-12-12 ENCOUNTER — Encounter: Payer: Self-pay | Admitting: Internal Medicine

## 2013-12-14 ENCOUNTER — Other Ambulatory Visit: Payer: Self-pay | Admitting: Internal Medicine

## 2014-01-06 ENCOUNTER — Other Ambulatory Visit: Payer: Self-pay | Admitting: Internal Medicine

## 2014-01-11 ENCOUNTER — Other Ambulatory Visit: Payer: Self-pay | Admitting: Gastroenterology

## 2014-01-11 DIAGNOSIS — R1310 Dysphagia, unspecified: Secondary | ICD-10-CM

## 2014-01-11 DIAGNOSIS — R143 Flatulence: Secondary | ICD-10-CM | POA: Diagnosis not present

## 2014-01-11 DIAGNOSIS — R142 Eructation: Secondary | ICD-10-CM | POA: Diagnosis not present

## 2014-01-11 DIAGNOSIS — R1032 Left lower quadrant pain: Secondary | ICD-10-CM | POA: Diagnosis not present

## 2014-01-11 DIAGNOSIS — K591 Functional diarrhea: Secondary | ICD-10-CM | POA: Diagnosis not present

## 2014-01-11 DIAGNOSIS — R141 Gas pain: Secondary | ICD-10-CM | POA: Diagnosis not present

## 2014-01-16 ENCOUNTER — Other Ambulatory Visit: Payer: Self-pay | Admitting: Internal Medicine

## 2014-01-25 ENCOUNTER — Other Ambulatory Visit: Payer: Medicare Other

## 2014-01-31 ENCOUNTER — Telehealth: Payer: Self-pay | Admitting: Internal Medicine

## 2014-01-31 MED ORDER — PREGABALIN 50 MG PO CAPS: 50.0000 mg | ORAL_CAPSULE | Freq: Three times a day (TID) | ORAL | Status: DC

## 2014-01-31 MED ORDER — ESOMEPRAZOLE MAGNESIUM 40 MG PO CPDR: 40.0000 mg | DELAYED_RELEASE_CAPSULE | Freq: Every day | ORAL | Status: DC

## 2014-01-31 MED ORDER — PROMETHAZINE HCL 25 MG PO TABS: ORAL_TABLET | ORAL | Status: DC

## 2014-01-31 MED ORDER — LEVOTHYROXINE SODIUM 25 MCG PO TABS: 25.0000 ug | ORAL_TABLET | Freq: Every day | ORAL | Status: DC

## 2014-01-31 NOTE — Telephone Encounter (Signed)
Rx sent to pharmacy, and Lyrica called into pharmacy.

## 2014-01-31 NOTE — Telephone Encounter (Signed)
[facility] requesting new scripts for the following:  esomeprazole (NEXIUM) 40 MG capsule levothyroxine (SYNTHROID, LEVOTHROID) 25 MCG tablet pregabalin (LYRICA) 50 MG capsule promethazine (PHENERGAN) 25 MG tablet

## 2014-03-02 ENCOUNTER — Other Ambulatory Visit: Payer: Self-pay | Admitting: Internal Medicine

## 2014-03-06 ENCOUNTER — Other Ambulatory Visit: Payer: Self-pay | Admitting: *Deleted

## 2014-03-06 MED ORDER — CLORAZEPATE DIPOTASSIUM 7.5 MG PO TABS: ORAL_TABLET | ORAL | Status: DC

## 2014-03-09 ENCOUNTER — Telehealth: Payer: Self-pay | Admitting: Internal Medicine

## 2014-03-09 MED ORDER — TRAMADOL HCL 50 MG PO TABS: ORAL_TABLET | ORAL | Status: DC

## 2014-03-09 NOTE — Telephone Encounter (Signed)
Rx called in to pharmacy. 

## 2014-03-09 NOTE — Telephone Encounter (Signed)
[facility], [address] Is requesting re-fill on traMADol (ULTRAM) 50 MG tablet

## 2014-03-22 ENCOUNTER — Ambulatory Visit
Admission: RE | Admit: 2014-03-22 | Discharge: 2014-03-22 | Disposition: A | Discharge status: Home / Self Care | Payer: Medicare Other | Source: Ambulatory Visit | Attending: Gastroenterology | Admitting: Gastroenterology

## 2014-03-22 DIAGNOSIS — K222 Esophageal obstruction: Secondary | ICD-10-CM | POA: Diagnosis not present

## 2014-03-22 DIAGNOSIS — R1310 Dysphagia, unspecified: Secondary | ICD-10-CM

## 2014-03-29 ENCOUNTER — Ambulatory Visit (INDEPENDENT_AMBULATORY_CARE_PROVIDER_SITE_OTHER): Payer: Medicare Other | Admitting: Internal Medicine

## 2014-03-29 ENCOUNTER — Encounter: Payer: Self-pay | Admitting: Internal Medicine

## 2014-03-29 ENCOUNTER — Telehealth: Payer: Self-pay | Admitting: Internal Medicine

## 2014-03-29 VITALS — BP 130/80 | HR 87 | Temp 98.0°F | Resp 20 | Ht <= 58 in | Wt 125.0 lb

## 2014-03-29 DIAGNOSIS — G4700 Insomnia, unspecified: Secondary | ICD-10-CM

## 2014-03-29 DIAGNOSIS — E785 Hyperlipidemia, unspecified: Secondary | ICD-10-CM | POA: Diagnosis not present

## 2014-03-29 DIAGNOSIS — K219 Gastro-esophageal reflux disease without esophagitis: Secondary | ICD-10-CM | POA: Diagnosis not present

## 2014-03-29 DIAGNOSIS — I10 Essential (primary) hypertension: Secondary | ICD-10-CM

## 2014-03-29 DIAGNOSIS — E039 Hypothyroidism, unspecified: Secondary | ICD-10-CM

## 2014-03-29 DIAGNOSIS — F411 Generalized anxiety disorder: Secondary | ICD-10-CM

## 2014-03-29 MED ORDER — PROMETHAZINE HCL 25 MG PO TABS: ORAL_TABLET | ORAL | Status: DC

## 2014-03-29 MED ORDER — TRAZODONE HCL 50 MG PO TABS: 25.0000 mg | ORAL_TABLET | Freq: Every evening | ORAL | Status: DC | PRN

## 2014-03-29 MED ORDER — LEVOTHYROXINE SODIUM 25 MCG PO TABS: 25.0000 ug | ORAL_TABLET | Freq: Every day | ORAL | Status: DC

## 2014-03-29 NOTE — Progress Notes (Signed)
Pre-visit discussion using our clinic review tool. No additional management support is needed unless otherwise documented below in the visit note.  

## 2014-03-29 NOTE — Patient Instructions (Addendum)
Limit your sodium (Salt) intake  Please check your blood pressure on a regular basis.  If it is consistently greater than 150/90, please make an office appointment.  Return in 6 months for follow-up  Avoids foods high in acid such as tomatoes citrus juices, and spicy foods.  Avoid eating within two hours of lying down or before exercising.  Do not overheat.  Try smaller more frequent meals.  If symptoms persist, elevate the head of her bed 12 inches while sleeping.  Insomnia Insomnia is frequent trouble falling and/or staying asleep. Insomnia can be a long term problem or a short term problem. Both are common. Insomnia can be a short term problem when the wakefulness is related to a certain stress or worry. Long term insomnia is often related to ongoing stress during waking hours and/or poor sleeping habits. Overtime, sleep deprivation itself can make the problem worse. Every little thing feels more severe because you are overtired and your ability to cope is decreased. CAUSES   Stress, anxiety, and depression.  Poor sleeping habits.  Distractions such as TV in the bedroom.  Naps close to bedtime.  Engaging in emotionally charged conversations before bed.  Technical reading before sleep.  Alcohol and other sedatives. They may make the problem worse. They can hurt normal sleep patterns and normal dream activity.  Stimulants such as caffeine for several hours prior to bedtime.  Pain syndromes and shortness of breath can cause insomnia.  Exercise late at night.  Changing time zones may cause sleeping problems (jet lag). It is sometimes helpful to have someone observe your sleeping patterns. They should look for periods of not breathing during the night (sleep apnea). They should also look to see how long those periods last. If you live alone or observers are uncertain, you can also be observed at a sleep clinic where your sleep patterns will be professionally monitored. Sleep apnea  requires a checkup and treatment. Give your caregivers your medical history. Give your caregivers observations your family has made about your sleep.  SYMPTOMS   Not feeling rested in the morning.  Anxiety and restlessness at bedtime.  Difficulty falling and staying asleep. TREATMENT   Your caregiver may prescribe treatment for an underlying medical disorders. Your caregiver can give advice or help if you are using alcohol or other drugs for self-medication. Treatment of underlying problems will usually eliminate insomnia problems.  Medications can be prescribed for short time use. They are generally not recommended for lengthy use.  Over-the-counter sleep medicines are not recommended for lengthy use. They can be habit forming.  You can promote easier sleeping by making lifestyle changes such as:  Using relaxation techniques that help with breathing and reduce muscle tension.  Exercising earlier in the day.  Changing your diet and the time of your last meal. No night time snacks.  Establish a regular time to go to bed.  Counseling can help with stressful problems and worry.  Soothing music and white noise may be helpful if there are background noises you cannot remove.  Stop tedious detailed work at least one hour before bedtime. HOME CARE INSTRUCTIONS   Keep a diary. Inform your caregiver about your progress. This includes any medication side effects. See your caregiver regularly. Take note of:  Times when you are asleep.  Times when you are awake during the night.  The quality of your sleep.  How you feel the next day. This information will help your caregiver care for you.  Get out  of bed if you are still awake after 15 minutes. Read or do some quiet activity. Keep the lights down. Wait until you feel sleepy and go back to bed.  Keep regular sleeping and waking hours. Avoid naps.  Exercise regularly.  Avoid distractions at bedtime. Distractions include watching  television or engaging in any intense or detailed activity like attempting to balance the household checkbook.  Develop a bedtime ritual. Keep a familiar routine of bathing, brushing your teeth, climbing into bed at the same time each night, listening to soothing music. Routines increase the success of falling to sleep faster.  Use relaxation techniques. This can be using breathing and muscle tension release routines. It can also include visualizing peaceful scenes. You can also help control troubling or intruding thoughts by keeping your mind occupied with boring or repetitive thoughts like the old concept of counting sheep. You can make it more creative like imagining planting one beautiful flower after another in your backyard garden.  During your day, work to eliminate stress. When this is not possible use some of the previous suggestions to help reduce the anxiety that accompanies stressful situations. MAKE SURE YOU:   Understand these instructions.  Will watch your condition.  Will get help right away if you are not doing well or get worse. Document Released: 11/21/2000 Document Revised: 02/16/2012 Document Reviewed: 12/22/2007 [facility] Patient Information 2014 [place].

## 2014-03-29 NOTE — Telephone Encounter (Signed)
Relevant patient education mailed to patient.  

## 2014-03-29 NOTE — Progress Notes (Signed)
Subjective:    Patient ID: Heather [name], female    DOB: 07-29-1935, 79 y.o.   MRN: [id number]  HPI   79 year old patient who is in today for her six-month followup.  She has a history of hypertension, dyslipidemia, and gastroesophageal reflux disease.  She has had a recent full GI evaluation.  That has included upper and lower endoscopy.  She has a history of anxiety, depression, in general, she seems to be doing quite well.  She is having some insomnia.  Denies any cardiopulmonary complaints.  Past Medical History  Diagnosis Date  . ANXIETY 07/23/2009  . DEPRESSION 07/23/2009  . DIVERTICULOSIS, COLON 07/23/2009  . GERD 07/23/2009  . HIP PAIN, LEFT 05/16/2010  . HYPERLIPIDEMIA 07/23/2009  . HYPERTENSION 07/23/2009  . HYPOTHYROIDISM 07/23/2009  . LOW BACK PAIN, CHRONIC 10/01/2009  . PARESTHESIA 10/01/2009  . IBS (irritable bowel syndrome)   . History of Crohn's disease   . Chronic pain   . Hypothyroidism   . Insomnia   . Headache(784.0) 07/23/2009    occasional  . Shingles 2006    back  . Hemorrhoids   . Complication of anesthesia 7 or 8 yrs ago    woke up during colonscopy    History   Social History  . Marital Status: Widowed    Spouse Name: N/A    Number of Children: N/A  . Years of Education: N/A   Occupational History  . Not on file.   Social History Main Topics  . Smoking status: Never Smoker   . Smokeless tobacco: Never Used  . Alcohol Use: No  . Drug Use: No  . Sexual Activity: Not on file   Other Topics Concern  . Not on file   Social History Narrative  . No narrative on file    Past Surgical History  Procedure Laterality Date  . Tonsillectomy  yrs ago  . Hemorrhoid surgery  yrs ago  . Appendectomy  yrs ago  . Bilateral salpingoophorectomy  age 5 or 73  . Abdominal hysterectomy  age 40 or 83  . Colonscopy  7 or 8 yrs ago  . Esophagogastroduodenoscopy N/A 11/17/2013    Procedure: ESOPHAGOGASTRODUODENOSCOPY (EGD);  Surgeon: Cleotis Nipper, MD;   Location: [facility];  Service: Endoscopy;  Laterality: N/A;  . Colonoscopy N/A 11/17/2013    Procedure: COLONOSCOPY;  Surgeon: Cleotis Nipper, MD;  Location: [facility];  Service: Endoscopy;  Laterality: N/A;    No family history on file.  Allergies  Allergen Reactions  . Symbicort [Budesonide-Formoterol Fumarate] Other (See Comments)    Pt felt like her tongue was swollen, could not swallow  . Duloxetine Other (See Comments)    Patient doesn't recall  . Metronidazole Other (See Comments)    Patient doesn't recall   . Rofecoxib Other (See Comments)    Patient doesn't recall     Current Outpatient Prescriptions on File Prior to Visit  Medication Sig Dispense Refill  . CARTIA XT 240 MG 24 hr capsule TAKE 1 CAPSULE BY MOUTH EVERY DAY  90 capsule  1  . Cholecalciferol 2000 UNITS TABS Take 2,000 Units by mouth daily.      . clorazepate (TRANXENE) 7.5 MG tablet TAKE (1) TABLET TWICE DAILY AS NEEDED FOR ANXIETY.  180 tablet  1  . Dextromethorphan-Guaifenesin (MUCINEX DM) 30-600 MG TB12 Take 1 tablet by mouth daily as needed (For congestion.).       [name] diltiazem (CARTIA XT) 240 MG 24 hr capsule Take 240 mg  by mouth daily.      [name] esomeprazole (NEXIUM) 40 MG capsule Take 1 capsule (40 mg total) by mouth daily before breakfast.  90 capsule  3  . ferrous sulfate 325 (65 FE) MG tablet Take 325 mg by mouth daily with breakfast.      . furosemide (LASIX) 20 MG tablet Take 20 mg by mouth daily as needed for fluid.      Vladimir Faster Glycol-Propyl Glycol (SYSTANE OP) Place 1 drop into both eyes 2 (two) times daily.       . polyethylene glycol powder (GLYCOLAX/MIRALAX) powder Take 17 g by mouth daily as needed for mild constipation.       . pregabalin (LYRICA) 50 MG capsule Take 1 capsule (50 mg total) by mouth 3 (three) times daily.  90 capsule  1  . sertraline (ZOLOFT) 25 MG tablet TAKE 1 TABLET BY MOUTH EVERY DAY  30 tablet  5  . traMADol (ULTRAM) 50 MG tablet TAKE 1 TABLET BY MOUTH EVERY 6  HOURS AS NEEDED FOR PAIN  60 tablet  3  . zolpidem (AMBIEN) 5 MG tablet TAKE 1 TABLET BY MOUTH AT BEDTIME AS NEEDED FOR SLEEP  30 tablet  5   No current facility-administered medications on file prior to visit.    BP 130/80  Pulse 87  Temp(Src) 98 F (36.7 C) (Oral)  Resp 20  Ht 4\' 10"  (1.473 m)  Wt 125 lb (56.7 kg)  BMI 26.13 kg/m2  SpO2 95%       Review of Systems  Constitutional: Negative.   HENT: Negative for congestion, dental problem, hearing loss, rhinorrhea, sinus pressure, sore throat and tinnitus.   Eyes: Negative for pain, discharge and visual disturbance.  Respiratory: Negative for cough and shortness of breath.   Cardiovascular: Negative for chest pain, palpitations and leg swelling.  Gastrointestinal: Negative for nausea, vomiting, abdominal pain, diarrhea, constipation, blood in stool and abdominal distention.  Genitourinary: Negative for dysuria, urgency, frequency, hematuria, flank pain, vaginal bleeding, vaginal discharge, difficulty urinating, vaginal pain and pelvic pain.  Musculoskeletal: Positive for back pain. Negative for arthralgias, gait problem and joint swelling.  Skin: Negative for rash.  Neurological: Negative for dizziness, syncope, speech difficulty, weakness, numbness and headaches.  Hematological: Negative for adenopathy.  Psychiatric/Behavioral: Positive for sleep disturbance. Negative for behavioral problems, dysphoric mood and agitation. The patient is nervous/anxious.        Objective:   Physical Exam  Constitutional: She is oriented to person, place, and time. She appears well-developed and well-nourished.  HENT:  Head: Normocephalic.  Right Ear: External ear normal.  Left Ear: External ear normal.  Mouth/Throat: Oropharynx is clear and moist.  Eyes: Conjunctivae and EOM are normal. Pupils are equal, round, and reactive to light.  Neck: Normal range of motion. Neck supple. No thyromegaly present.  Cardiovascular: Normal rate,  regular rhythm, normal heart sounds and intact distal pulses.   Pulmonary/Chest: Effort normal and breath sounds normal.  Abdominal: Soft. Bowel sounds are normal. She exhibits no mass. There is no tenderness.  Musculoskeletal: Normal range of motion.  Lymphadenopathy:    She has no cervical adenopathy.  Neurological: She is alert and oriented to person, place, and time.  Skin: Skin is warm and dry. No rash noted.  Psychiatric: She has a normal mood and affect. Her behavior is normal.          Assessment & Plan:   Anxiety, depression.  Fairly stable.  We'll continue with sertraline. Hypertension well controlled Dyslipidemia.  We'll check a lipid profile in 6 months. Gastroesophageal reflux disease.  Continue PPI. Insomnia.  We'll at low dose trazodone

## 2014-04-07 DIAGNOSIS — R1032 Left lower quadrant pain: Secondary | ICD-10-CM | POA: Diagnosis not present

## 2014-04-07 DIAGNOSIS — R1310 Dysphagia, unspecified: Secondary | ICD-10-CM | POA: Diagnosis not present

## 2014-05-17 ENCOUNTER — Other Ambulatory Visit: Payer: Self-pay | Admitting: Internal Medicine

## 2014-05-19 ENCOUNTER — Other Ambulatory Visit: Payer: Self-pay | Admitting: Internal Medicine

## 2014-05-29 ENCOUNTER — Other Ambulatory Visit: Payer: Self-pay | Admitting: Internal Medicine

## 2014-06-16 ENCOUNTER — Other Ambulatory Visit: Payer: Self-pay | Admitting: Internal Medicine

## 2014-07-17 ENCOUNTER — Other Ambulatory Visit: Payer: Self-pay | Admitting: Internal Medicine

## 2014-08-06 ENCOUNTER — Other Ambulatory Visit: Payer: Self-pay | Admitting: Internal Medicine

## 2014-08-13 ENCOUNTER — Inpatient Hospital Stay (HOSPITAL_COMMUNITY)
Admission: EM | Admit: 2014-08-13 | Discharge: 2014-08-16 | DRG: 689 | Disposition: A | Discharge status: Home Health | Payer: Medicare Other | Attending: Internal Medicine | Admitting: Internal Medicine

## 2014-08-13 ENCOUNTER — Other Ambulatory Visit (HOSPITAL_COMMUNITY): Payer: Medicare Other

## 2014-08-13 ENCOUNTER — Encounter (HOSPITAL_COMMUNITY): Payer: Self-pay | Admitting: Emergency Medicine

## 2014-08-13 ENCOUNTER — Emergency Department (HOSPITAL_COMMUNITY): Payer: Medicare Other

## 2014-08-13 DIAGNOSIS — E039 Hypothyroidism, unspecified: Secondary | ICD-10-CM | POA: Diagnosis present

## 2014-08-13 DIAGNOSIS — M6282 Rhabdomyolysis: Secondary | ICD-10-CM

## 2014-08-13 DIAGNOSIS — K219 Gastro-esophageal reflux disease without esophagitis: Secondary | ICD-10-CM | POA: Diagnosis present

## 2014-08-13 DIAGNOSIS — S0990XA Unspecified injury of head, initial encounter: Secondary | ICD-10-CM | POA: Diagnosis not present

## 2014-08-13 DIAGNOSIS — F419 Anxiety disorder, unspecified: Secondary | ICD-10-CM | POA: Diagnosis present

## 2014-08-13 DIAGNOSIS — G8929 Other chronic pain: Secondary | ICD-10-CM | POA: Diagnosis present

## 2014-08-13 DIAGNOSIS — W19XXXA Unspecified fall, initial encounter: Secondary | ICD-10-CM

## 2014-08-13 DIAGNOSIS — Z79899 Other long term (current) drug therapy: Secondary | ICD-10-CM

## 2014-08-13 DIAGNOSIS — F329 Major depressive disorder, single episode, unspecified: Secondary | ICD-10-CM | POA: Diagnosis present

## 2014-08-13 DIAGNOSIS — F3289 Other specified depressive episodes: Secondary | ICD-10-CM | POA: Diagnosis not present

## 2014-08-13 DIAGNOSIS — I10 Essential (primary) hypertension: Secondary | ICD-10-CM | POA: Diagnosis not present

## 2014-08-13 DIAGNOSIS — G9340 Encephalopathy, unspecified: Secondary | ICD-10-CM | POA: Diagnosis present

## 2014-08-13 DIAGNOSIS — F411 Generalized anxiety disorder: Secondary | ICD-10-CM | POA: Diagnosis present

## 2014-08-13 DIAGNOSIS — T796XXA Traumatic ischemia of muscle, initial encounter: Secondary | ICD-10-CM | POA: Diagnosis not present

## 2014-08-13 DIAGNOSIS — M25559 Pain in unspecified hip: Secondary | ICD-10-CM | POA: Diagnosis not present

## 2014-08-13 DIAGNOSIS — E785 Hyperlipidemia, unspecified: Secondary | ICD-10-CM | POA: Diagnosis present

## 2014-08-13 DIAGNOSIS — R5383 Other fatigue: Secondary | ICD-10-CM | POA: Diagnosis not present

## 2014-08-13 DIAGNOSIS — R55 Syncope and collapse: Secondary | ICD-10-CM | POA: Diagnosis present

## 2014-08-13 DIAGNOSIS — N390 Urinary tract infection, site not specified: Secondary | ICD-10-CM | POA: Diagnosis present

## 2014-08-13 DIAGNOSIS — R4182 Altered mental status, unspecified: Secondary | ICD-10-CM | POA: Diagnosis not present

## 2014-08-13 DIAGNOSIS — S79919A Unspecified injury of unspecified hip, initial encounter: Secondary | ICD-10-CM | POA: Diagnosis not present

## 2014-08-13 DIAGNOSIS — R5381 Other malaise: Secondary | ICD-10-CM | POA: Diagnosis not present

## 2014-08-13 DIAGNOSIS — R404 Transient alteration of awareness: Secondary | ICD-10-CM | POA: Diagnosis not present

## 2014-08-13 LAB — URINALYSIS, ROUTINE W REFLEX MICROSCOPIC
Bilirubin Urine: NEGATIVE
Glucose, UA: NEGATIVE mg/dL
Hgb urine dipstick: NEGATIVE
Ketones, ur: NEGATIVE mg/dL
Nitrite: NEGATIVE
Protein, ur: NEGATIVE mg/dL
Specific Gravity, Urine: 1.012 (ref 1.005–1.030)
Urobilinogen, UA: 0.2 mg/dL (ref 0.0–1.0)
pH: 6 (ref 5.0–8.0)

## 2014-08-13 LAB — CBC WITH DIFFERENTIAL/PLATELET
Basophils Absolute: 0 10*3/uL (ref 0.0–0.1)
Basophils Relative: 0 % (ref 0–1)
Eosinophils Absolute: 0 10*3/uL (ref 0.0–0.7)
Eosinophils Relative: 0 % (ref 0–5)
HCT: 41.6 % (ref 36.0–46.0)
Hemoglobin: 13.7 g/dL (ref 12.0–15.0)
Lymphocytes Relative: 15 % (ref 12–46)
Lymphs Abs: 1.6 10*3/uL (ref 0.7–4.0)
MCH: 29.9 pg (ref 26.0–34.0)
MCHC: 32.9 g/dL (ref 30.0–36.0)
MCV: 90.8 fL (ref 78.0–100.0)
Monocytes Absolute: 1.1 10*3/uL — ABNORMAL HIGH (ref 0.1–1.0)
Monocytes Relative: 10 % (ref 3–12)
Neutro Abs: 8.4 10*3/uL — ABNORMAL HIGH (ref 1.7–7.7)
Neutrophils Relative %: 75 % (ref 43–77)
Platelets: 362 10*3/uL (ref 150–400)
RBC: 4.58 MIL/uL (ref 3.87–5.11)
RDW: 13.7 % (ref 11.5–15.5)
WBC: 11.2 10*3/uL — ABNORMAL HIGH (ref 4.0–10.5)

## 2014-08-13 LAB — URINE MICROSCOPIC-ADD ON

## 2014-08-13 LAB — BASIC METABOLIC PANEL
Anion gap: 13 (ref 5–15)
BUN: 10 mg/dL (ref 6–23)
CO2: 28 mEq/L (ref 19–32)
Calcium: 9.5 mg/dL (ref 8.4–10.5)
Chloride: 102 mEq/L (ref 96–112)
Creatinine, Ser: 0.78 mg/dL (ref 0.50–1.10)
GFR calc Af Amer: >90 mL/min (ref >90)
GFR calc non Af Amer: 79 mL/min — ABNORMAL LOW (ref >90)
Glucose, Bld: 110 mg/dL — ABNORMAL HIGH (ref 70–99)
Potassium: 4.3 mEq/L (ref 3.7–5.3)
Sodium: 143 mEq/L (ref 137–147)

## 2014-08-13 LAB — CK: Total CK: 573 U/L — ABNORMAL HIGH (ref 7–177)

## 2014-08-13 LAB — TROPONIN I
Troponin I: <0.3 ng/mL (ref <0.30)
Troponin I: <0.3 ng/mL (ref <0.30)

## 2014-08-13 MED ORDER — HYDROCODONE-ACETAMINOPHEN 5-325 MG PO TABS
1.0000 | ORAL_TABLET | ORAL | Status: DC | PRN
  Administered 2014-08-14 – 2014-08-15 (×3): 1 via ORAL
  Administered 2014-08-15: 2 via ORAL
  Filled 2014-08-13: qty 1
  Filled 2014-08-13: qty 2
  Filled 2014-08-13 (×2): qty 1

## 2014-08-13 MED ORDER — LEVOTHYROXINE SODIUM 25 MCG PO TABS
25.0000 ug | ORAL_TABLET | Freq: Every day | ORAL | Status: DC
  Administered 2014-08-14 – 2014-08-16 (×3): 25 ug via ORAL
  Filled 2014-08-13 (×4): qty 1

## 2014-08-13 MED ORDER — TRAMADOL HCL 50 MG PO TABS
50.0000 mg | ORAL_TABLET | Freq: Four times a day (QID) | ORAL | Status: DC | PRN
  Administered 2014-08-14 – 2014-08-16 (×5): 50 mg via ORAL
  Filled 2014-08-13 (×5): qty 1

## 2014-08-13 MED ORDER — CLORAZEPATE DIPOTASSIUM 3.75 MG PO TABS
3.7500 mg | ORAL_TABLET | Freq: Two times a day (BID) | ORAL | Status: DC | PRN
  Administered 2014-08-14 – 2014-08-15 (×2): 3.75 mg via ORAL
  Filled 2014-08-13 (×2): qty 1

## 2014-08-13 MED ORDER — DEXTROSE 5 % IV SOLN
1.0000 g | INTRAVENOUS | Status: DC
  Administered 2014-08-13: 1 g via INTRAVENOUS
  Filled 2014-08-13 (×2): qty 10

## 2014-08-13 MED ORDER — PANTOPRAZOLE SODIUM 40 MG PO TBEC
40.0000 mg | DELAYED_RELEASE_TABLET | Freq: Every day | ORAL | Status: DC
  Administered 2014-08-13 – 2014-08-16 (×4): 40 mg via ORAL
  Filled 2014-08-13 (×4): qty 1

## 2014-08-13 MED ORDER — DILTIAZEM HCL ER COATED BEADS 240 MG PO CP24
240.0000 mg | ORAL_CAPSULE | Freq: Every day | ORAL | Status: DC
  Administered 2014-08-14 – 2014-08-16 (×3): 240 mg via ORAL
  Filled 2014-08-13 (×3): qty 1

## 2014-08-13 MED ORDER — ONDANSETRON HCL 4 MG PO TABS
4.0000 mg | ORAL_TABLET | Freq: Four times a day (QID) | ORAL | Status: DC | PRN
  Administered 2014-08-14 – 2014-08-16 (×6): 4 mg via ORAL
  Filled 2014-08-13 (×6): qty 1

## 2014-08-13 MED ORDER — FERROUS SULFATE 325 (65 FE) MG PO TABS
325.0000 mg | ORAL_TABLET | Freq: Every day | ORAL | Status: DC
  Administered 2014-08-14 – 2014-08-16 (×3): 325 mg via ORAL
  Filled 2014-08-13 (×4): qty 1

## 2014-08-13 MED ORDER — TRAZODONE 25 MG HALF TABLET
25.0000 mg | ORAL_TABLET | Freq: Every evening | ORAL | Status: DC | PRN
  Administered 2014-08-13 – 2014-08-15 (×2): 25 mg via ORAL
  Filled 2014-08-13: qty 2

## 2014-08-13 MED ORDER — DOCUSATE SODIUM 100 MG PO CAPS
100.0000 mg | ORAL_CAPSULE | Freq: Two times a day (BID) | ORAL | Status: DC
  Administered 2014-08-13 – 2014-08-16 (×6): 100 mg via ORAL
  Filled 2014-08-13 (×7): qty 1

## 2014-08-13 MED ORDER — MORPHINE SULFATE 2 MG/ML IJ SOLN: 2.0000 mg | INTRAMUSCULAR | Status: DC | PRN

## 2014-08-13 MED ORDER — SODIUM CHLORIDE 0.9 % IJ SOLN
3.0000 mL | Freq: Two times a day (BID) | INTRAMUSCULAR | Status: DC
  Administered 2014-08-13 – 2014-08-16 (×4): 3 mL via INTRAVENOUS

## 2014-08-13 MED ORDER — SERTRALINE HCL 25 MG PO TABS
25.0000 mg | ORAL_TABLET | Freq: Every day | ORAL | Status: DC
  Administered 2014-08-13 – 2014-08-16 (×4): 25 mg via ORAL
  Filled 2014-08-13 (×4): qty 1

## 2014-08-13 MED ORDER — VITAMIN D3 25 MCG (1000 UNIT) PO TABS
2000.0000 [IU] | ORAL_TABLET | Freq: Every day | ORAL | Status: DC
  Administered 2014-08-14 – 2014-08-16 (×3): 2000 [IU] via ORAL
  Filled 2014-08-13 (×3): qty 2

## 2014-08-13 MED ORDER — ACETAMINOPHEN 325 MG PO TABS: 650.0000 mg | ORAL_TABLET | Freq: Four times a day (QID) | ORAL | Status: DC | PRN

## 2014-08-13 MED ORDER — HEPARIN SODIUM (PORCINE) 5000 UNIT/ML IJ SOLN
5000.0000 [IU] | Freq: Three times a day (TID) | INTRAMUSCULAR | Status: DC
  Administered 2014-08-13 – 2014-08-16 (×6): 5000 [IU] via SUBCUTANEOUS
  Filled 2014-08-13 (×12): qty 1

## 2014-08-13 MED ORDER — PREGABALIN 50 MG PO CAPS
50.0000 mg | ORAL_CAPSULE | Freq: Three times a day (TID) | ORAL | Status: DC
  Administered 2014-08-13 – 2014-08-16 (×9): 50 mg via ORAL
  Filled 2014-08-13 (×9): qty 1

## 2014-08-13 MED ORDER — HYDROMORPHONE HCL PF 1 MG/ML IJ SOLN
0.5000 mg | INTRAMUSCULAR | Status: DC | PRN
  Administered 2014-08-13: 0.5 mg via INTRAVENOUS
  Filled 2014-08-13: qty 1

## 2014-08-13 MED ORDER — SODIUM CHLORIDE 0.9 % IV SOLN
INTRAVENOUS | Status: AC
  Administered 2014-08-13 – 2014-08-14 (×2): via INTRAVENOUS

## 2014-08-13 MED ORDER — CHOLECALCIFEROL 50 MCG (2000 UT) PO TABS: 2000.0000 [IU] | ORAL_TABLET | Freq: Every day | ORAL | Status: DC

## 2014-08-13 MED ORDER — SENNA 8.6 MG PO TABS
1.0000 | ORAL_TABLET | Freq: Two times a day (BID) | ORAL | Status: DC
  Administered 2014-08-13 – 2014-08-16 (×6): 8.6 mg via ORAL
  Filled 2014-08-13 (×7): qty 1

## 2014-08-13 MED ORDER — ONDANSETRON HCL 4 MG/2ML IJ SOLN: 4.0000 mg | Freq: Four times a day (QID) | INTRAMUSCULAR | Status: DC | PRN

## 2014-08-13 MED ORDER — BISACODYL 10 MG RE SUPP: 10.0000 mg | Freq: Every day | RECTAL | Status: DC | PRN

## 2014-08-13 MED ORDER — ACETAMINOPHEN 650 MG RE SUPP: 650.0000 mg | Freq: Four times a day (QID) | RECTAL | Status: DC | PRN

## 2014-08-13 NOTE — ED Notes (Signed)
Pt states she woke on floor this am.  Pt is slightly confused per EMS.  No neck or back pain. C/o left hip  And right elbow.pt lives alone.

## 2014-08-13 NOTE — ED Notes (Signed)
Pt unsure if she fell last night or this am.  Pt alert and oriented on arrival to ED

## 2014-08-13 NOTE — ED Notes (Signed)
MD Nanavati at bedside.

## 2014-08-13 NOTE — ED Provider Notes (Signed)
CSN: [id number]     Arrival date & time 08/13/14  1257 History   First MD Initiated Contact with Patient 08/13/14 1315     Chief Complaint  Patient presents with  . Fall  . Weakness     (Consider location/radiation/quality/duration/timing/severity/associated sxs/prior Treatment) HPI Comments: Pt comes in with cc of fall. Pt reports that waking up, face down on the floor this afternoon. Pt reports that her TV was on, and she typically watches TV from her bed, but she doesn't recall turning the TV on, or feeling like she was going to pass out. She has no hx of seizures, and denies any incontinence or painful tongue. There is no hx of similar sx in the past.  Pt has hx of htn, hl, crohns, chronic pain. She lives by herself. Currently, pt has a headache, L sided pain - arms, hip, leg. Not on anticuagulants.  The history is provided by the patient.    Past Medical History  Diagnosis Date  . ANXIETY 07/23/2009  . DEPRESSION 07/23/2009  . DIVERTICULOSIS, COLON 07/23/2009  . GERD 07/23/2009  . HIP PAIN, LEFT 05/16/2010  . HYPERLIPIDEMIA 07/23/2009  . HYPERTENSION 07/23/2009  . HYPOTHYROIDISM 07/23/2009  . LOW BACK PAIN, CHRONIC 10/01/2009  . PARESTHESIA 10/01/2009  . IBS (irritable bowel syndrome)   . History of Crohn's disease   . Chronic pain   . Hypothyroidism   . Insomnia   . Headache(784.0) 07/23/2009    occasional  . Shingles 2006    back  . Hemorrhoids   . Complication of anesthesia 7 or 8 yrs ago    woke up during colonscopy   Past Surgical History  Procedure Laterality Date  . Tonsillectomy  yrs ago  . Hemorrhoid surgery  yrs ago  . Appendectomy  yrs ago  . Bilateral salpingoophorectomy  age 46 or 34  . Abdominal hysterectomy  age 66 or 83  . Colonscopy  7 or 8 yrs ago  . Esophagogastroduodenoscopy N/A 11/17/2013    Procedure: ESOPHAGOGASTRODUODENOSCOPY (EGD);  Surgeon: Cleotis Nipper, MD;  Location: [facility];  Service: Endoscopy;  Laterality: N/A;  . Colonoscopy N/A  11/17/2013    Procedure: COLONOSCOPY;  Surgeon: Cleotis Nipper, MD;  Location: [facility];  Service: Endoscopy;  Laterality: N/A;   History reviewed. No pertinent family history. History  Substance Use Topics  . Smoking status: Never Smoker   . Smokeless tobacco: Never Used  . Alcohol Use: No   OB History   Grav Para Term Preterm Abortions TAB SAB Ect Mult Living                 Review of Systems  Constitutional: Negative for fever, chills and activity change.  HENT: Negative for facial swelling.   Respiratory: Negative for cough, shortness of breath and wheezing.   Cardiovascular: Negative for chest pain, palpitations and leg swelling.  Gastrointestinal: Negative for nausea, vomiting, abdominal pain, diarrhea, constipation, blood in stool and abdominal distention.  Genitourinary: Negative for dysuria, hematuria and difficulty urinating.  Musculoskeletal: Negative for neck pain.  Skin: Positive for wound. Negative for color change.  Neurological: Positive for syncope and headaches. Negative for speech difficulty.  Hematological: Does not bruise/bleed easily.  Psychiatric/Behavioral: Negative for confusion.      Allergies  Symbicort; Duloxetine; Metronidazole; and Rofecoxib  Home Medications   Prior to Admission medications   Medication Sig Start Date End Date Taking? Authorizing Provider  Cholecalciferol 2000 UNITS TABS Take 2,000 Units by mouth daily.   Yes  Historical Provider, MD  clorazepate (TRANXENE) 7.5 MG tablet Take 7.5 mg by mouth 2 (two) times daily as needed for anxiety.   Yes Historical Provider, MD  diltiazem (CARDIZEM CD) 240 MG 24 hr capsule Take 240 mg by mouth daily.   Yes Historical Provider, MD  esomeprazole (NEXIUM) 40 MG capsule Take 1 capsule (40 mg total) by mouth daily before breakfast. 01/31/14  Yes [name], MD  levothyroxine (SYNTHROID, LEVOTHROID) 25 MCG tablet Take 1 tablet (25 mcg total) by mouth daily before breakfast. 03/29/14   Yes [name], MD  OVER THE COUNTER MEDICATION Take 1 capsule by mouth daily. OTC laxative   Yes Historical Provider, MD  Polyethyl Glycol-Propyl Glycol (SYSTANE OP) Place 1 drop into both eyes 2 (two) times daily.    Yes Historical Provider, MD  pregabalin (LYRICA) 50 MG capsule Take 1 capsule (50 mg total) by mouth 3 (three) times daily. 01/31/14  Yes [name], MD  promethazine (PHENERGAN) 25 MG tablet Take 25 mg by mouth every 6 (six) hours as needed for nausea.   Yes Historical Provider, MD  sertraline (ZOLOFT) 25 MG tablet Take 25 mg by mouth daily.   Yes Historical Provider, MD  traMADol (ULTRAM) 50 MG tablet Take 50 mg by mouth every 6 (six) hours as needed for moderate pain.   Yes Historical Provider, MD  traZODone (DESYREL) 50 MG tablet Take 0.5-1 tablets (25-50 mg total) by mouth at bedtime as needed for sleep. 03/29/14  Yes [name], MD  ferrous sulfate 325 (65 FE) MG tablet Take 325 mg by mouth daily with breakfast.    Historical Provider, MD   BP 127/57  Pulse 81  Temp(Src) 99.2 F (37.3 C) (Oral)  Resp 17  SpO2 95% Physical Exam  Nursing note and vitals reviewed. Constitutional: She is oriented to person, place, and time. She appears well-developed and well-nourished.  HENT:  Head: Normocephalic and atraumatic.  Eyes: EOM are normal. Pupils are equal, round, and reactive to light.  Neck: Neck supple.  No midline c-spine tenderness, pt able to turn head to 45 degrees bilaterally without any pain and able to flex neck to the chest and extend without any pain or neurologic symptoms.   Cardiovascular: Normal rate, regular rhythm and normal heart sounds.   No murmur heard. Pulmonary/Chest: Effort normal. No respiratory distress.  Abdominal: Soft. She exhibits no distension. There is no tenderness. There is no rebound and no guarding.  Musculoskeletal:  Bilateral hip tenderness.  Head to toe evaluation shows no hematoma, bleeding of the scalp, no  facial abrasions, step offs, crepitus. No tenderness to palpation of the bilateral upper and lower extremities, no gross deformities, no chest tenderness.  Neurological: She is alert and oriented to person, place, and time.  Skin: Skin is warm and dry.    ED Course  Procedures (including critical care time) Labs Review Labs Reviewed  CBC WITH DIFFERENTIAL - Abnormal; Notable for the following:    WBC 11.2 (*)    Neutro Abs 8.4 (*)    Monocytes Absolute 1.1 (*)    All other components within normal limits  BASIC METABOLIC PANEL - Abnormal; Notable for the following:    Glucose, Bld 110 (*)    GFR calc non Af Amer 79 (*)    All other components within normal limits  URINALYSIS, ROUTINE W REFLEX MICROSCOPIC - Abnormal; Notable for the following:    APPearance HAZY (*)    Leukocytes, UA TRACE (*)  All other components within normal limits  CK - Abnormal; Notable for the following:    Total CK 573 (*)    All other components within normal limits  URINE MICROSCOPIC-ADD ON - Abnormal; Notable for the following:    Squamous Epithelial / LPF MANY (*)    Bacteria, UA FEW (*)    Casts HYALINE CASTS (*)    Crystals CA OXALATE CRYSTALS (*)    All other components within normal limits  TROPONIN I    Imaging Review Dg Hip Bilateral W/pelvis  08/13/2014   CLINICAL DATA:  Lateral right hip pain and left hip pain, fall today  EXAM: BILATERAL HIP WITH PELVIS - 4+ VIEW  COMPARISON:  CT 10/03/2013  FINDINGS: No displaced pelvic fracture. Bilateral proximal femora appear unremarkable and the femoral heads appear properly located. No soft tissue abnormality. Normal visualized bowel gas pattern.  IMPRESSION: No bilateral hip or pelvic fracture identified.   Electronically Signed   By: Conchita Paris M.D.   On: 08/13/2014 15:16   Ct Head Wo Contrast  08/13/2014   CLINICAL DATA:  Patient found down, altered mental status, confusion  EXAM: CT HEAD WITHOUT CONTRAST  TECHNIQUE: Contiguous axial images  were obtained from the base of the skull through the vertex without intravenous contrast.  COMPARISON:  Brain MRI 02/19/2011, head CT 02/18/2011  FINDINGS: Mild cortical volume loss noted with proportional ventricular prominence. Areas of periventricular white matter hypodensity are most compatible with small vessel ischemic change. No acute hemorrhage, infarct, or mass lesion is identified. No significant change since prior exam. No skull fracture. A focus of trabecular rarefaction within the left occipital bone image 26 is stable. Mild mucoperiosteal thickening is identified in the ethmoid and sphenoid sinuses. Globes are grossly unremarkable.  IMPRESSION: No acute intracranial finding or significant interval change.   Electronically Signed   By: Conchita Paris M.D.   On: 08/13/2014 16:39     EKG Interpretation   Date/Time:  Sunday August 13 2014 13:05:21 EDT Ventricular Rate:  92 PR Interval:  158 QRS Duration: 77 QT Interval:  356 QTC Calculation: 440 R Axis:   73 Text Interpretation:  Sinus rhythm Nonspecific T abnormalities, diffuse  leads No acute changes No new findings Confirmed by Kathrynn Humble, MD, Thelma Comp  [phone]) on 08/13/2014 1:13:26 PM      MDM   Final diagnoses:  Traumatic rhabdomyolysis, initial encounter  Fall, initial encounter  Syncope and collapse    DDx includes: Orthostatic hypotension Stroke Vertebral artery dissection/stenosis Dysrhythmia PE Vasovagal/neurocardiogenic syncope Aortic stenosis Valvular disorder/Cardiomyopathy Anemia  DDx includes: - ICH - Fractures - Contusions - Soft tissue injury  PT comes in with fall and has hip pain, and inability to walk. It appears that patient fell due to syncope. She hd no prodrome at all, which is concerning. EKG shows no specific interval problems or dysrhythmias, which is reassuring. PT is not on any new meds.  Pt reports inability to get up after fall, and having to crawl to the phone over a period of  several minutes. CK is elevated. Pt is unable to walk in the ER. Xrays of B hip and CT head are normal.  Will admit for syncope.  Varney Biles, MD 08/13/14 [phone]

## 2014-08-13 NOTE — H&P (Signed)
Patient Demographics  Heather [name], is a 79 y.o. female  MRN: [id number]   DOB - 1935/09/18  Admit Date - 08/13/2014  Outpatient Primary MD for the patient is [name], MD   With History of -  Past Medical History  Diagnosis Date  . ANXIETY 07/23/2009  . DEPRESSION 07/23/2009  . DIVERTICULOSIS, COLON 07/23/2009  . GERD 07/23/2009  . HIP PAIN, LEFT 05/16/2010  . HYPERLIPIDEMIA 07/23/2009  . HYPERTENSION 07/23/2009  . HYPOTHYROIDISM 07/23/2009  . LOW BACK PAIN, CHRONIC 10/01/2009  . PARESTHESIA 10/01/2009  . IBS (irritable bowel syndrome)   . History of Crohn's disease   . Chronic pain   . Hypothyroidism   . Insomnia   . Headache(784.0) 07/23/2009    occasional  . Shingles 2006    back  . Hemorrhoids   . Complication of anesthesia 7 or 8 yrs ago    woke up during colonscopy      Past Surgical History  Procedure Laterality Date  . Tonsillectomy  yrs ago  . Hemorrhoid surgery  yrs ago  . Appendectomy  yrs ago  . Bilateral salpingoophorectomy  age 39 or 41  . Abdominal hysterectomy  age 59 or 42  . Colonscopy  7 or 8 yrs ago  . Esophagogastroduodenoscopy N/A 11/17/2013    Procedure: ESOPHAGOGASTRODUODENOSCOPY (EGD);  Surgeon: Cleotis Nipper, MD;  Location: [facility];  Service: Endoscopy;  Laterality: N/A;  . Colonoscopy N/A 11/17/2013    Procedure: COLONOSCOPY;  Surgeon: Cleotis Nipper, MD;  Location: [facility];  Service: Endoscopy;  Laterality: N/A;    in for   Chief Complaint  Patient presents with  . Fall  . Weakness     HPI  Heather [name]  is a 79 y.o. female, who lives at home by herself, ambulate without assistance, presents with syncope, patient reports she workup on the floor this afternoon, face down, she does not remember waking up or trying to walk, but the TV was on, which she usually turns on after she wakes up, she remember only going to bed yesterday night, patient was too weak she could not stand up secondary to her  generalized weakness and hip pain, where she had to crawl on the floor to call EMS, orthostatic could not be checked in ED secondary to hip pain, x-ray of the hips did not show any acute fracture, patient workup was only significant for UTI, and elevated total CK, no leukocytosis, no significant electrolyte abnormalities, no renal failure, CT head did not show any acute findings, patient denies any urinary or stool incontinence, there was no tongue bite, she denies any focal deficits, tingling numbness, slurred speech, altered mental status, hospitalist requested to admit the patient for further evaluation. She reports dysuria started today.   Review of Systems    In addition to the HPI above,  No Fever-chills, No Headache, No changes with Vision or hearing, No problems swallowing food or Liquids, No Chest pain, Cough or Shortness of Breath, No Abdominal pain, No Nausea or Vommitting, Bowel movements are regular, No Blood in stool or Urine, Report dysuria, No new skin rashes or bruises, No new joints pains-aches,  No new focal weakness, tingling, numbness in any extremity, but complains of generalized weakness No recent weight gain or loss, No polyuria, polydypsia or polyphagia, No significant Mental Stressors.  A full 10 point Review of Systems was done, except as stated above, all other Review of Systems were negative.   Social History History  Substance Use Topics  . Smoking status: Never Smoker   . Smokeless tobacco: Never Used  . Alcohol Use: No     Family History History reviewed. No pertinent family history.   Prior to Admission medications   Medication Sig Start Date End Date Taking? Authorizing Provider  Cholecalciferol 2000 UNITS TABS Take 2,000 Units by mouth daily.   Yes Historical Provider, MD  clorazepate (TRANXENE) 7.5 MG tablet Take 7.5 mg by mouth 2 (two) times daily as needed for anxiety.   Yes Historical Provider, MD  diltiazem (CARDIZEM CD) 240 MG 24 hr  capsule Take 240 mg by mouth daily.   Yes Historical Provider, MD  esomeprazole (NEXIUM) 40 MG capsule Take 1 capsule (40 mg total) by mouth daily before breakfast. 01/31/14  Yes [name], MD  levothyroxine (SYNTHROID, LEVOTHROID) 25 MCG tablet Take 1 tablet (25 mcg total) by mouth daily before breakfast. 03/29/14  Yes [name], MD  OVER THE COUNTER MEDICATION Take 1 capsule by mouth daily. OTC laxative   Yes Historical Provider, MD  Polyethyl Glycol-Propyl Glycol (SYSTANE OP) Place 1 drop into both eyes 2 (two) times daily.    Yes Historical Provider, MD  pregabalin (LYRICA) 50 MG capsule Take 1 capsule (50 mg total) by mouth 3 (three) times daily. 01/31/14  Yes [name], MD  promethazine (PHENERGAN) 25 MG tablet Take 25 mg by mouth every 6 (six) hours as needed for nausea.   Yes Historical Provider, MD  sertraline (ZOLOFT) 25 MG tablet Take 25 mg by mouth daily.   Yes Historical Provider, MD  traMADol (ULTRAM) 50 MG tablet Take 50 mg by mouth every 6 (six) hours as needed for moderate pain.   Yes Historical Provider, MD  traZODone (DESYREL) 50 MG tablet Take 0.5-1 tablets (25-50 mg total) by mouth at bedtime as needed for sleep. 03/29/14  Yes [name], MD  ferrous sulfate 325 (65 FE) MG tablet Take 325 mg by mouth daily with breakfast.    Historical Provider, MD    Allergies  Allergen Reactions  . Symbicort [Budesonide-Formoterol Fumarate] Other (See Comments)    Pt felt like her tongue was swollen, could not swallow  . Duloxetine Other (See Comments)    Patient doesn't recall  . Metronidazole Other (See Comments)    Patient doesn't recall   . Rofecoxib Other (See Comments)    Patient doesn't recall     Physical Exam  Vitals  Blood pressure 142/60, pulse 81, temperature 99.2 F (37.3 C), temperature source Oral, resp. rate 17, SpO2 94.00%.   1. General frail elderly female lying in bed in NAD,    2. Normal affect and insight, Not Suicidal  or Homicidal, Awake Alert, Oriented X 3.  3. No F.N deficits, ALL C.Nerves Intact, Strength 5/5 all 4 extremities, Sensation intact all 4 extremities, Plantars down going.  4. Ears and Eyes appear Normal, Conjunctivae clear, PERRLA. Moist Oral Mucosa.  5. Supple Neck, No JVD, No cervical lymphadenopathy appriciated, No Carotid Bruits.  6. Symmetrical Chest wall movement, Good air movement bilaterally, CTAB.  7. RRR, No Gallops, Rubs or Murmurs, No Parasternal Heave.  8. Positive Bowel Sounds, Abdomen Soft, No tenderness, No organomegaly appriciated,No rebound -guarding or rigidity.  9.  No Cyanosis, Normal Skin Turgor, No Skin Rash or Bruise.  10. Good muscle tone,  joints appear normal , no effusions, Normal ROM. Has mild tenderness around hip area upon active flexion and in extension of her hips, but she is  able to perform that without significant pain.  11. No Palpable Lymph Nodes in Neck or Axillae    Data Review  CBC  Recent Labs Lab 08/13/14 1408  WBC 11.2*  HGB 13.7  HCT 41.6  PLT 362  MCV 90.8  MCH 29.9  MCHC 32.9  RDW 13.7  LYMPHSABS 1.6  MONOABS 1.1*  EOSABS 0.0  BASOSABS 0.0   ------------------------------------------------------------------------------------------------------------------  Chemistries   Recent Labs Lab 08/13/14 1408  NA 143  K 4.3  CL 102  CO2 28  GLUCOSE 110*  BUN 10  CREATININE 0.78  CALCIUM 9.5   ------------------------------------------------------------------------------------------------------------------ CrCl is unknown because both a height and weight (above a minimum accepted value) are required for this calculation. ------------------------------------------------------------------------------------------------------------------ No results found for this basename: TSH, T4TOTAL, FREET3, T3FREE, THYROIDAB,  in the last 72 hours   Coagulation profile No results found for this basename: INR, PROTIME,  in the last 168  hours ------------------------------------------------------------------------------------------------------------------- No results found for this basename: DDIMER,  in the last 72 hours -------------------------------------------------------------------------------------------------------------------  Cardiac Enzymes  Recent Labs Lab 08/13/14 1408  TROPONINI <0.30   ------------------------------------------------------------------------------------------------------------------ No components found with this basename: POCBNP,    ---------------------------------------------------------------------------------------------------------------  Urinalysis    Component Value Date/Time   COLORURINE YELLOW 08/13/2014 1411   APPEARANCEUR HAZY* 08/13/2014 1411   LABSPEC 1.012 08/13/2014 1411   PHURINE 6.0 08/13/2014 1411   GLUCOSEU NEGATIVE 08/13/2014 1411   [place] 08/13/2014 1411   HGBUR trace-lysed 11/15/2010 [place] 08/13/2014 1411   [place] 08/13/2014 1411   PROTEINUR NEGATIVE 08/13/2014 1411   UROBILINOGEN 0.2 08/13/2014 1411   NITRITE NEGATIVE 08/13/2014 1411   LEUKOCYTESUR TRACE* 08/13/2014 1411    ----------------------------------------------------------------------------------------------------------------  Imaging results:   Dg Hip Bilateral W/pelvis  08/13/2014   CLINICAL DATA:  Lateral right hip pain and left hip pain, fall today  EXAM: BILATERAL HIP WITH PELVIS - 4+ VIEW  COMPARISON:  CT 10/03/2013  FINDINGS: No displaced pelvic fracture. Bilateral proximal femora appear unremarkable and the femoral heads appear properly located. No soft tissue abnormality. Normal visualized bowel gas pattern.  IMPRESSION: No bilateral hip or pelvic fracture identified.   Electronically Signed   By: Conchita Paris M.D.   On: 08/13/2014 15:16   Ct Head Wo Contrast  08/13/2014   CLINICAL DATA:  Patient found down, altered mental status, confusion  EXAM: CT HEAD WITHOUT  CONTRAST  TECHNIQUE: Contiguous axial images were obtained from the base of the skull through the vertex without intravenous contrast.  COMPARISON:  Brain MRI 02/19/2011, head CT 02/18/2011  FINDINGS: Mild cortical volume loss noted with proportional ventricular prominence. Areas of periventricular white matter hypodensity are most compatible with small vessel ischemic change. No acute hemorrhage, infarct, or mass lesion is identified. No significant change since prior exam. No skull fracture. A focus of trabecular rarefaction within the left occipital bone image 26 is stable. Mild mucoperiosteal thickening is identified in the ethmoid and sphenoid sinuses. Globes are grossly unremarkable.  IMPRESSION: No acute intracranial finding or significant interval change.   Electronically Signed   By: Conchita Paris M.D.   On: 08/13/2014 16:39    My personal review of EKG: Rhythm NSR, Rate  92 /min, QTc for 440 , no Acute ST changes    Assessment & Plan  Principal Problem:   Syncope Active Problems:   UTI (lower urinary tract infection)   Rhabdomyolysis   HYPOTHYROIDISM   DEPRESSION   HYPERTENSION   GERD    1.  Syncope:  This is most likely related to UTI, patient will be admitted to telemetry floor, she will be kept on IV fluids for hydration, could not check orthostatic in ED, we'll treat her UTI, will check 2-D echo, continue to monitor her on telemetry, will check bilateral carotid Dopplers, cardiac enzymes, check TSH, continue with hydration. 2. Rhabdomyolysis: Patient has elevation of her total CK, appears to be on the floor for few hours, continue with IV fluid hydration, avoid nephrotoxic medication, monitor BMP close, repeat total CK in a.m. 3. UTI: Will start patient on Rocephin 4. Hypothyroidism: Continue with 5. Depression:  Continue with home medication. 6. Hypertension: Blood pressure is acceptable, continue with Cardizem. 7. GERD: Continue with PPI 8. Generalized  weakness: Consult PT/OT, hydrate, treat UTI  DVT Prophylaxis Heparin -  SCDs   AM Labs Ordered, also please review Full Orders  Family Communication: Admission, patients condition and plan of care including tests being ordered have been discussed with the patient and friend at the who indicate understanding and agree with the plan and Code Status. Tried to call son but no answer.  Code Status full code, but does not wish to be kept on life support if has poor prognosis, has no living well, son is health care power of attorney.  Likely DC to  home versus subacute rehabilitation  Condition GUARDED   Time spent in minutes : 17 minute    Lopez Dentinger M.D on 08/13/2014 at 5:57 PM  Between 7am to 7pm - Pager - [phone]  After 7pm go to [email or web address] - password [email or web address]  And look for the night coverage person covering me after hours  [facility] Office  [phone]   **Disclaimer: This note may have been dictated with voice recognition software. Similar sounding words can inadvertently be transcribed and this note may contain transcription errors which may not have been corrected upon publication of note.**

## 2014-08-13 NOTE — ED Notes (Signed)
Report given to Leslie, RN.

## 2014-08-13 NOTE — ED Notes (Signed)
Called CT to advise pt is ready now.  Several ahead of pt.

## 2014-08-13 NOTE — ED Notes (Addendum)
Pt admitted to 3W room 25, pt transported on tele via stretcher, report given to [employer]

## 2014-08-13 NOTE — ED Notes (Signed)
Pt up to [facility] with the assist of Chastity NT and [employer]. Pt was unable to hold herself up. Pt was able to take a few steps but then began holding onto the staff stating, "I can't walk anymore, my hips want go"

## 2014-08-13 NOTE — Progress Notes (Signed)
Went to get patient for Head CT, pt was using the bathroom and RN was in room waiting to start and IV and draw labs.  Will check back.

## 2014-08-14 DIAGNOSIS — F329 Major depressive disorder, single episode, unspecified: Secondary | ICD-10-CM

## 2014-08-14 DIAGNOSIS — N390 Urinary tract infection, site not specified: Principal | ICD-10-CM

## 2014-08-14 DIAGNOSIS — R55 Syncope and collapse: Secondary | ICD-10-CM

## 2014-08-14 DIAGNOSIS — T796XXA Traumatic ischemia of muscle, initial encounter: Secondary | ICD-10-CM

## 2014-08-14 DIAGNOSIS — F3289 Other specified depressive episodes: Secondary | ICD-10-CM

## 2014-08-14 LAB — CBC
HCT: 34.3 % — ABNORMAL LOW (ref 36.0–46.0)
Hemoglobin: 11.2 g/dL — ABNORMAL LOW (ref 12.0–15.0)
MCH: 29.9 pg (ref 26.0–34.0)
MCHC: 32.7 g/dL (ref 30.0–36.0)
MCV: 91.5 fL (ref 78.0–100.0)
Platelets: 290 10*3/uL (ref 150–400)
RBC: 3.75 MIL/uL — ABNORMAL LOW (ref 3.87–5.11)
RDW: 13.8 % (ref 11.5–15.5)
WBC: 7 10*3/uL (ref 4.0–10.5)

## 2014-08-14 LAB — BASIC METABOLIC PANEL
Anion gap: 12 (ref 5–15)
BUN: 8 mg/dL (ref 6–23)
CO2: 28 mEq/L (ref 19–32)
Calcium: 8.5 mg/dL (ref 8.4–10.5)
Chloride: 106 mEq/L (ref 96–112)
Creatinine, Ser: 0.77 mg/dL (ref 0.50–1.10)
GFR calc Af Amer: >90 mL/min (ref >90)
GFR calc non Af Amer: 79 mL/min — ABNORMAL LOW (ref >90)
Glucose, Bld: 101 mg/dL — ABNORMAL HIGH (ref 70–99)
Potassium: 3.4 mEq/L — ABNORMAL LOW (ref 3.7–5.3)
Sodium: 146 mEq/L (ref 137–147)

## 2014-08-14 LAB — CK TOTAL AND CKMB (NOT AT ARMC)
CK, MB: 4.6 ng/mL — ABNORMAL HIGH (ref 0.3–4.0)
Relative Index: 1 (ref 0.0–2.5)
Total CK: 441 U/L — ABNORMAL HIGH (ref 7–177)

## 2014-08-14 LAB — TROPONIN I
Troponin I: <0.3 ng/mL (ref <0.30)
Troponin I: <0.3 ng/mL (ref <0.30)

## 2014-08-14 MED ORDER — DEXTROSE 5 % IV SOLN
1.0000 g | INTRAVENOUS | Status: DC
  Administered 2014-08-14 – 2014-08-15 (×2): 1 g via INTRAVENOUS
  Filled 2014-08-14 (×2): qty 10

## 2014-08-14 NOTE — Progress Notes (Signed)
*  PRELIMINARY RESULTS* Vascular Ultrasound Carotid Duplex (Doppler) has been completed.  Findings suggest 1-39% internal carotid artery stenosis bilaterally. Vertebral arteries are patent with antegrade flow.  08/14/2014 5:17 PM Maudry Mayhew, RVT, RDCS, RDMS

## 2014-08-14 NOTE — Progress Notes (Signed)
[facility] PRO[facility]ESS NOTE Assessment/Plan: Syncope: - Check orhtostatic. U?A, U.C. Pending. - cardiac markers negative x2 - no focal weakness. Echo pending.  Rhabdomyolysis - < 5K. KVO IV fluids.  HYPOTHYROIDISM - cont synthroid.   DEPRESSION Continue with home medication.   Hypertension:  Blood pressure is acceptable, continue with Cardizem  Code Status: full Family Communication: none  Disposition Plan: inpatinet   Consultants:  none  Procedures:  Echo  CXR  Antibiotics: rocephin HPI/Subjective: No complains feels better  Objective: Filed Vitals:   08/13/14 1758 08/13/14 1950 08/13/14 2114 08/14/14 0600  BP:   140/49 127/46  Pulse:   71 73  Temp: 98.6 F (37 C)  98.7 F (37.1 C) 97.8 F (36.6 C)  TempSrc: Oral  Oral Oral  Resp:   18 18  Height:  4\' 11"  (1.499 m)    Weight:  55.203 kg (121 lb 11.2 oz)  55.2 kg (121 lb 11.1 oz)  SpO2:   97% 95%    Intake/Output Summary (Last 24 hours) at 08/14/14 1032 Last data filed at 08/14/14 0600  Gross per 24 hour  Intake 961.25 ml  Output    400 ml  Net 561.25 ml   Filed Weights   08/13/14 1950 08/14/14 0600  Weight: 55.203 kg (121 lb 11.2 oz) 55.2 kg (121 lb 11.1 oz)    Exam:  General: Alert, awake, oriented x3, in no acute distress.  HEENT: No bruits, no goiter.  Heart: Regular rate and rhythm. Lungs: Good air movement, clear Abdomen: Soft, nontender, nondistended, positive bowel sounds.  Neuro: Grossly intact, nonfocal.   Data Reviewed: Basic Metabolic Panel:  Recent Labs Lab 08/13/14 1408 08/14/14 0210  NA 143 146  K 4.3 3.4*  CL 102 106  CO2 28 28  GLUCOSE 110* 101*  BUN 10 8  CREATININE 0.78 0.77  CALCIUM 9.5 8.5   Liver Function Tests: No results found for this basename: AST, ALT, ALKPHOS, BILITOT, PROT, ALBUMIN,  in the last 168 hours No results found for this basename: LIPASE, AMYLASE,  in the last 168 hours No results found for this basename: AMMONIA,  in the  last 168 hours CBC:  Recent Labs Lab 08/13/14 1408 08/14/14 0210  WBC 11.2* 7.0  NEUTROABS 8.4*  --   HGB 13.7 11.2*  HCT 41.6 34.3*  MCV 90.8 91.5  PLT 362 290   Cardiac Enzymes:  Recent Labs Lab 08/13/14 1408 08/13/14 2040 08/14/14 0210 08/14/14 0652  CKTOTAL 573*  --   --  441*  CKMB  --   --   --  4.6*  TROPONINI <0.30 <0.30 <0.30 <0.30   BNP (last 3 results) No results found for this basename: PROBNP,  in the last 8760 hours CBG: No results found for this basename: GLUCAP,  in the last 168 hours  No results found for this or any previous visit (from the past 240 hour(s)).   Studies: Dg Hip Bilateral W/pelvis  08/13/2014   CLINICAL DATA:  Lateral right hip pain and left hip pain, fall today  EXAM: BILATERAL HIP WITH PELVIS - 4+ VIEW  COMPARISON:  CT 10/03/2013  FINDINGS: No displaced pelvic fracture. Bilateral proximal femora appear unremarkable and the femoral heads appear properly located. No soft tissue abnormality. Normal visualized bowel gas pattern.  IMPRESSION: No bilateral hip or pelvic fracture identified.   Electronically Signed   By: Conchita Paris M.D.   On: 08/13/2014 15:16   Ct Head Wo Contrast  08/13/2014   CLINICAL DATA:  Patient found down, altered mental status, confusion  EXAM: CT HEAD WITHOUT CONTRAST  TECHNIQUE: Contiguous axial images were obtained from the base of the skull through the vertex without intravenous contrast.  COMPARISON:  Brain MRI 02/19/2011, head CT 02/18/2011  FINDINGS: Mild cortical volume loss noted with proportional ventricular prominence. Areas of periventricular white matter hypodensity are most compatible with small vessel ischemic change. No acute hemorrhage, infarct, or mass lesion is identified. No significant change since prior exam. No skull fracture. A focus of trabecular rarefaction within the left occipital bone image 26 is stable. Mild mucoperiosteal thickening is identified in the ethmoid and sphenoid sinuses. Globes are  grossly unremarkable.  IMPRESSION: No acute intracranial finding or significant interval change.   Electronically Signed   By: Conchita Paris M.D.   On: 08/13/2014 16:39    Scheduled Meds: . cefTRIAXone (ROCEPHIN)  IV  1 g Intravenous Q24H  . cholecalciferol  2,000 Units Oral Daily  . diltiazem  240 mg Oral Daily  . docusate sodium  100 mg Oral BID  . ferrous sulfate  325 mg Oral Q breakfast  . heparin  5,000 Units Subcutaneous 3 times per day  . levothyroxine  25 mcg Oral QAC breakfast  . pantoprazole  40 mg Oral Daily  . pregabalin  50 mg Oral TID  . senna  1 tablet Oral BID  . sertraline  25 mg Oral Daily  . sodium chloride  3 mL Intravenous Q12H   Continuous Infusions: . sodium chloride 75 mL/hr at 08/14/14 0842     Charlynne Cousins  [facility] Pager [phone]. If 8PM-8AM, please contact night-coverage at [email or web address], password [facility] 08/14/2014, 10:32 AM  LOS: 1 day      **Disclaimer: This note may have been dictated with voice recognition software. Similar sounding words can inadvertently be transcribed and this note may contain transcription errors which may not have been corrected upon publication of note.**

## 2014-08-14 NOTE — Evaluation (Signed)
Physical Therapy Evaluation Patient Details Name: Heather [name] MRN: [id number] DOB: 03/07/1935 Today's Date: 08/14/2014   History of Present Illness  Patient is a 79 y/o female admitted s/p syncopal episode at home. Pt reports she woke up on the floor, face down and does not remember waking up or trying to walk. Pt only remembers going to bed the previous night and was too weak she could not stand up secondary to her generalized weakness and hip pain- had to crawl on the floor to call EMS. Hip x-rays (-). UTI (+)   Clinical Impression  Patient presents with functional limitations due to deficits listed in PT problem list (see below). Pt with generalized weakness and balance deficits putting pt at increased risk for falls. Recommend use of RW for support during gait due to safety. Pt with short term memory impairments noticed within session even with cues. Pt would benefit from skilled acute PT and ST SNF to improve gait, balance and overall safe mobility so pt can maximize independence and return to PLOF.    Follow Up Recommendations SNF;Supervision/Assistance - 24 hour    Equipment Recommendations   (defer to SNF.)    Recommendations for Other Services       Precautions / Restrictions Precautions Precautions: Fall Precaution Comments: Aspiration precautions Restrictions Weight Bearing Restrictions: No      Mobility  Bed Mobility Overal bed mobility: Needs Assistance Bed Mobility: Rolling;Sidelying to Sit Rolling: Supervision Sidelying to sit: Supervision       General bed mobility comments: HOB flat, use of rails. Increased time and cues for technique required due to soreness.  Transfers Overall transfer level: Needs assistance Equipment used: None Transfers: Sit to/from Stand Sit to Stand: Min guard         General transfer comment: Able to stand from low bed height without AD. VC for hand placement. Dizziness upon standing - resolved within 1 minute. Min guard  for safety.  Ambulation/Gait Ambulation/Gait assistance: Min guard Ambulation Distance (Feet): 150 Feet Assistive device: Rolling walker (2 wheeled) Gait Pattern/deviations: Step-through pattern;Decreased stride length;Drifts right/left;Trunk flexed Gait velocity: decreased   General Gait Details: Unsteady gait pattern noted. Drifting present and VC for RW management and safety. 1 long standing rest break half way through bout due to weakness, fatigue. Pt pushing RW away during gait to try without it, LOB noted. requiring Min A to prevent fall.  Stairs            Wheelchair Mobility    Modified Rankin (Stroke Patients Only)       Balance Overall balance assessment: Needs assistance Sitting-balance support: Feet supported;No upper extremity supported Sitting balance-Leahy Scale: Fair     Standing balance support: During functional activity;Bilateral upper extremity supported Standing balance-Leahy Scale: Poor Standing balance comment: Requires BUE support on RW during dynamic standing activities due to balance deficits. Attempted to perform turning without AD and LOB noted.                              Pertinent Vitals/Pain Pain Assessment:  (No pain but reports being "sore all over.")    Home Living Family/patient expects to be discharged to:: Skilled nursing facility Living Arrangements: Alone                    Prior Function Level of Independence: Independent               Hand Dominance  Extremity/Trunk Assessment   Upper Extremity Assessment: Overall WFL for tasks assessed           Lower Extremity Assessment: Generalized weakness         Communication   Communication: No difficulties  Cognition Arousal/Alertness: Awake/alert Behavior During Therapy: WFL for tasks assessed/performed Overall Cognitive Status: No family/caregiver present to determine baseline cognitive functioning Area of Impairment:  Memory;Orientation;Problem solving Orientation Level: Disoriented to;Time   Memory: Decreased short-term memory       Problem Solving: Slow processing;Requires verbal cues General Comments: Pt repeatedly told it was "labor day" however unable to state this later in session on a few occasions even with cues. "I just can't get it out."  Delayed processing and response to answer questions.     General Comments General comments (skin integrity, edema, etc.): Wears glasses.     Exercises        Assessment/Plan    PT Assessment Patient needs continued PT services  PT Diagnosis Difficulty walking;Generalized weakness   PT Problem List Decreased strength;Decreased cognition;Decreased activity tolerance;Decreased knowledge of use of DME;Decreased safety awareness;Decreased balance;Decreased mobility  PT Treatment Interventions DME instruction;Balance training;Gait training;Cognitive remediation;Functional mobility training;Patient/family education;Therapeutic activities;Therapeutic exercise   PT Goals (Current goals can be found in the Care Plan section) Acute Rehab PT Goals Patient Stated Goal: to get walking better PT Goal Formulation: With patient Time For Goal Achievement: 08/28/14 Potential to Achieve Goals: Good    Frequency Min 2X/week   Barriers to discharge Decreased caregiver support Pt lives alone    Co-evaluation               End of Session Equipment Utilized During Treatment: Gait belt Activity Tolerance: Patient limited by fatigue Patient left: in chair;with call bell/phone within reach Nurse Communication: Mobility status         Time: 0131-4388 PT Time Calculation (min): 37 min   Charges:   PT Evaluation $Initial PT Evaluation Tier I: 1 Procedure PT Treatments $Gait Training: 8-22 mins   PT G CodesCandy Sledge A 08/14/2014, 11:26 AM Candy Sledge, [place], DPT [phone]

## 2014-08-15 DIAGNOSIS — M6282 Rhabdomyolysis: Secondary | ICD-10-CM

## 2014-08-15 MED ORDER — METHOCARBAMOL 500 MG PO TABS
500.0000 mg | ORAL_TABLET | Freq: Three times a day (TID) | ORAL | Status: DC | PRN
  Administered 2014-08-15: 500 mg via ORAL
  Filled 2014-08-15: qty 1

## 2014-08-15 MED ORDER — CIPROFLOXACIN HCL 500 MG PO TABS
500.0000 mg | ORAL_TABLET | Freq: Two times a day (BID) | ORAL | Status: DC
Start: 2014-08-15 — End: 2014-08-16
  Administered 2014-08-15 – 2014-08-16 (×2): 500 mg via ORAL
  Filled 2014-08-15 (×5): qty 1

## 2014-08-15 MED ORDER — POTASSIUM CHLORIDE CRYS ER 20 MEQ PO TBCR
40.0000 meq | EXTENDED_RELEASE_TABLET | Freq: Two times a day (BID) | ORAL | Status: AC
  Administered 2014-08-15 (×2): 40 meq via ORAL
  Filled 2014-08-15 (×2): qty 2

## 2014-08-15 NOTE — Progress Notes (Addendum)
[facility] PROGRESS NOTE Assessment/Plan: Syncope/generalized weakness/? Acute encephalopathy: - due to ? UTI U.C. Not send for analysis - started empirically on rocephin on admisison, now d/c. - start cipro watch overnight. - cardiac markers negative x2 - no focal weakness. Echo Systolic function was normal. The estimated ejection fraction was in the range of 55% to 60%.  Rhabdomyolysis - < 5K. KVO IV fluids.  HYPOTHYROIDISM - cont synthroid.   DEPRESSION Continue with home medication.   Hypertension:  Blood pressure is acceptable, continue with Cardizem  Code Status: full Family Communication: none  Disposition Plan: inpatinet   Consultants:  none  Procedures:  Echo  CXR  Antibiotics: rocephin HPI/Subjective: No complains feels better  Objective: Filed Vitals:   08/14/14 0600 08/14/14 1402 08/14/14 2038 08/15/14 0455  BP: 127/46 126/46 140/45 111/61  Pulse: 73 69 68 63  Temp: 97.8 F (36.6 C) 99.5 F (37.5 C) 98.6 F (37 C) 98.4 F (36.9 C)  TempSrc: Oral Oral Oral Oral  Resp: 18 18 18 18   Height:      Weight: 55.2 kg (121 lb 11.1 oz)   55.4 kg (122 lb 2.2 oz)  SpO2: 95% 97% 95% 95%    Intake/Output Summary (Last 24 hours) at 08/15/14 0948 Last data filed at 08/15/14 0100  Gross per 24 hour  Intake    600 ml  Output    800 ml  Net   -200 ml   Filed Weights   08/13/14 1950 08/14/14 0600 08/15/14 0455  Weight: 55.203 kg (121 lb 11.2 oz) 55.2 kg (121 lb 11.1 oz) 55.4 kg (122 lb 2.2 oz)    Exam:  General: Alert, awake, oriented x3, in no acute distress.  HEENT: No bruits, no goiter.  Heart: Regular rate and rhythm. Lungs: Good air movement, clear Abdomen: Soft, nontender, nondistended, positive bowel sounds.  Neuro: Grossly intact, nonfocal.   Data Reviewed: Basic Metabolic Panel:  Recent Labs Lab 08/13/14 1408 08/14/14 0210  NA 143 146  K 4.3 3.4*  CL 102 106  CO2 28 28  GLUCOSE 110* 101*  BUN 10 8  CREATININE  0.78 0.77  CALCIUM 9.5 8.5   Liver Function Tests: No results found for this basename: AST, ALT, ALKPHOS, BILITOT, PROT, ALBUMIN,  in the last 168 hours No results found for this basename: LIPASE, AMYLASE,  in the last 168 hours No results found for this basename: AMMONIA,  in the last 168 hours CBC:  Recent Labs Lab 08/13/14 1408 08/14/14 0210  WBC 11.2* 7.0  NEUTROABS 8.4*  --   HGB 13.7 11.2*  HCT 41.6 34.3*  MCV 90.8 91.5  PLT 362 290   Cardiac Enzymes:  Recent Labs Lab 08/13/14 1408 08/13/14 2040 08/14/14 0210 08/14/14 0652  CKTOTAL 573*  --   --  441*  CKMB  --   --   --  4.6*  TROPONINI <0.30 <0.30 <0.30 <0.30   BNP (last 3 results) No results found for this basename: PROBNP,  in the last 8760 hours CBG: No results found for this basename: GLUCAP,  in the last 168 hours  No results found for this or any previous visit (from the past 240 hour(s)).   Studies: Dg Hip Bilateral W/pelvis  08/13/2014   CLINICAL DATA:  Lateral right hip pain and left hip pain, fall today  EXAM: BILATERAL HIP WITH PELVIS - 4+ VIEW  COMPARISON:  CT 10/03/2013  FINDINGS: No displaced pelvic fracture. Bilateral proximal femora appear unremarkable and the femoral heads  appear properly located. No soft tissue abnormality. Normal visualized bowel gas pattern.  IMPRESSION: No bilateral hip or pelvic fracture identified.   Electronically Signed   By: Conchita Paris M.D.   On: 08/13/2014 15:16   Ct Head Wo Contrast  08/13/2014   CLINICAL DATA:  Patient found down, altered mental status, confusion  EXAM: CT HEAD WITHOUT CONTRAST  TECHNIQUE: Contiguous axial images were obtained from the base of the skull through the vertex without intravenous contrast.  COMPARISON:  Brain MRI 02/19/2011, head CT 02/18/2011  FINDINGS: Mild cortical volume loss noted with proportional ventricular prominence. Areas of periventricular white matter hypodensity are most compatible with small vessel ischemic change. No acute  hemorrhage, infarct, or mass lesion is identified. No significant change since prior exam. No skull fracture. A focus of trabecular rarefaction within the left occipital bone image 26 is stable. Mild mucoperiosteal thickening is identified in the ethmoid and sphenoid sinuses. Globes are grossly unremarkable.  IMPRESSION: No acute intracranial finding or significant interval change.   Electronically Signed   By: Conchita Paris M.D.   On: 08/13/2014 16:39    Scheduled Meds: . cholecalciferol  2,000 Units Oral Daily  . ciprofloxacin  500 mg Oral BID  . diltiazem  240 mg Oral Daily  . docusate sodium  100 mg Oral BID  . ferrous sulfate  325 mg Oral Q breakfast  . heparin  5,000 Units Subcutaneous 3 times per day  . levothyroxine  25 mcg Oral QAC breakfast  . pantoprazole  40 mg Oral Daily  . potassium chloride  40 mEq Oral BID  . pregabalin  50 mg Oral TID  . senna  1 tablet Oral BID  . sertraline  25 mg Oral Daily  . sodium chloride  3 mL Intravenous Q12H   Continuous Infusions: . sodium chloride 10 mL/hr at 08/14/14 [place], Panagiotis Oelkers  [facility] Pager [phone]. If 8PM-8AM, please contact night-coverage at [email or web address], password [facility] 08/15/2014, 9:48 AM  LOS: 2 days      **Disclaimer: This note may have been dictated with voice recognition software. Similar sounding words can inadvertently be transcribed and this note may contain transcription errors which may not have been corrected upon publication of note.**

## 2014-08-15 NOTE — Care Management Note (Addendum)
    Page 1 of 2   08/16/2014     1:51:55 PM CARE MANAGEMENT NOTE 08/16/2014  Patient:  Heather [name], Heather [name]   Account Number:  [id number]  Date Initiated:  08/15/2014  Documentation initiated by:  GRAVES-BIGELOW,Azaela Caracci  Subjective/Objective Assessment:   Pt admitted for syncope and UTI. Initiated on IV Rocephin.     Action/Plan:   Plan is for SNF once stable for d/c. CSW assisting with disposition needs.   Anticipated DC Date:  08/16/2014   Anticipated DC Plan:  SKILLED NURSING FACILITY  In-house referral  Clinical Social Worker      DC Forensic scientist  CM consult      [facility] Choice  HOME HEALTH   Choice offered to / List presented to:  C-1 Patient        [place] arranged  HH-1 RN  [place] PT      [place] agency  [place]   Status of service:  Completed, signed off Medicare Important Message given?  YES (If response is "NO", the following Medicare IM given date fields will be blank) Date Medicare IM given:  08/15/2014 Medicare IM given by:  GRAVES-BIGELOW,Mirian Casco Date Additional Medicare IM given:   Additional Medicare IM given by:    Discharge Disposition:  [place]  Per UR Regulation:  Reviewed for med. necessity/level of care/duration of stay  If discussed at [place] of Stay Meetings, dates discussed:    Comments:   08-16-14 [place], RN,BSN [phone] Plan for d/c home with [facility] services via [facility]. WS office can service the pt. No further needs from CM at this time. MD please place orders for [facility] RN, PT.  08-16-14 [address], RN,BSN [phone] CM did speak to pt and son. Pt will go home with son Heather [name] at d/c. Heather [name] [phone]. Address [place], [place] [address]. CM did make referral with [facility] to see if WS office has availability. Awaiting call back. No DME needs at this time.

## 2014-08-16 LAB — URINALYSIS, ROUTINE W REFLEX MICROSCOPIC
Bilirubin Urine: NEGATIVE
Glucose, UA: NEGATIVE mg/dL
Hgb urine dipstick: NEGATIVE
Ketones, ur: NEGATIVE mg/dL
Leukocytes, UA: NEGATIVE
Nitrite: NEGATIVE
Protein, ur: NEGATIVE mg/dL
Specific Gravity, Urine: 1.013 (ref 1.005–1.030)
Urobilinogen, UA: 0.2 mg/dL (ref 0.0–1.0)
pH: 7 (ref 5.0–8.0)

## 2014-08-16 NOTE — Progress Notes (Signed)
Clinical Social Work Department BRIEF PSYCHOSOCIAL ASSESSMENT 08/16/2014  Patient:  Heather [name], Heather [name]     Account Number:  [id number]     Admit date:  08/13/2014  Clinical Social Worker:  Adair Laundry  Date/Time:  08/15/2014 03:30 PM  Referred by:  Physician  Date Referred:  08/15/2014 Referred for  SNF Placement   Other Referral:   Interview type:  Patient Other interview type:    PSYCHOSOCIAL DATA Living Status:  ALONE Admitted from facility:   Level of care:   Primary support name:  [name] [phone] Primary support relationship to patient:  CHILD, ADULT Degree of support available:   Pt has good support available    CURRENT CONCERNS Current Concerns  Post-Acute Placement   Other Concerns:    SOCIAL WORK ASSESSMENT / PLAN CSW visited pt room and spoke with pt about PT recommendations. Pt was understanding of recommendation and had numerous questions about SNF process. CSW answered questions as best possible. CSW explained referral and selection process to pt and pt became upset. Pt informed CSW she would want to visit facilities prior to having to stay there for two weeks. CSW asked if pt had any friends or family that could visit on her behalf. Pt informed CSW her son could possibly do this. After further discussion of SNF referral process, pt once again stated she did not want to dc somewhere she had not seen. CSW explained that SNF policiies require pt to dc from hospital straight to SNF and pt would not be able to make stops inbetween. Pt not happy with this information and informed CSW she would like to speak with her son before making any decisions. CSW asked if pt would be agreeable to CSW sending out referral in the mean time so pt would have available options of SNF is the dc plan her and her son decide on. However, pt was not agreeable to this and became upset with CSW. CSW offered support and informed pt that CSW would visit again in the morning  after pt had spoken with her son.   Assessment/plan status:  Psychosocial Support/Ongoing Assessment of Needs Other assessment/ plan:   Information/referral to community resources:   SNF list denied    PATIENT'S/FAMILY'S RESPONSE TO PLAN OF CARE: Pt is agreeable to SNF for ST rehab however she is unhappy with referral and selection process. At this time, pt undecided on dc plan.       [place], [place]

## 2014-08-16 NOTE — Discharge Instructions (Signed)
Cardiac Diet This diet can help prevent heart disease and stroke. Many factors influence your heart health, including eating and exercise habits. Coronary risk rises a lot with abnormal blood fat (lipid) levels. Cardiac meal planning includes limiting unhealthy fats, increasing healthy fats, and making other small dietary changes. General guidelines are as follows:  Adjust calorie intake to reach and maintain desirable body weight.  Limit total fat intake to less than 30% of total calories. Saturated fat should be less than 7% of calories.  Saturated fats are found in animal products and in some vegetable products. Saturated vegetable fats are found in coconut oil, cocoa butter, palm oil, and palm kernel oil. Read labels carefully to avoid these products as much as possible. Use butter in moderation. Choose tub margarines and oils that have 2 grams of fat or less. Good cooking oils are canola and olive oils.  Practice low-fat cooking techniques. Do not fry food. Instead, broil, bake, boil, steam, grill, roast on a rack, stir-fry, or microwave it. Other fat reducing suggestions include:  Remove the skin from poultry.  Remove all visible fat from meats.  Skim the fat off stews, soups, and gravies before serving them.  Steam vegetables in water or broth instead of sauting them in fat.  Avoid foods with trans fat (or hydrogenated oils), such as commercially fried foods and commercially baked goods. Commercial shortening and deep-frying fats will contain trans fat.  Increase intake of fruits, vegetables, whole grains, and legumes to replace foods high in fat.  Increase consumption of nuts, legumes, and seeds to at least 4 servings weekly. One serving of a legume equals  cup, and 1 serving of nuts or seeds equals  cup.  Choose whole grains more often. Have 3 servings per day (a serving is 1 ounce [oz]).  Eat 4 to 5 servings of vegetables per day. A serving of vegetables is 1 cup of raw leafy  vegetables;  cup of raw or cooked cut-up vegetables;  cup of vegetable juice.  Eat 4 to 5 servings of fruit per day. A serving of fruit is 1 medium whole fruit;  cup of dried fruit;  cup of fresh, frozen, or canned fruit;  cup of 100% fruit juice.  Increase your intake of dietary fiber to 20 to 30 grams per day. Insoluble fiber may help lower your risk of heart disease and may help curb your appetite.  Soluble fiber binds cholesterol to be removed from the blood. Foods high in soluble fiber are dried beans, citrus fruits, oats, apples, bananas, broccoli, Brussels sprouts, and eggplant.  Try to include foods fortified with plant sterols or stanols, such as yogurt, breads, juices, or margarines. Choose several fortified foods to achieve a daily intake of 2 to 3 grams of plant sterols or stanols.  Foods with omega-3 fats can help reduce your risk of heart disease. Aim to have a 3.5 oz portion of fatty fish twice per week, such as salmon, mackerel, albacore tuna, sardines, lake trout, or herring. If you wish to take a fish oil supplement, choose one that contains 1 gram of both DHA and EPA.  Limit processed meats to 2 servings (3 oz portion) weekly.  Limit the sodium in your diet to 1500 milligrams (mg) per day. If you have high blood pressure, talk to a registered dietitian about a DASH (Dietary Approaches to Stop Hypertension) eating plan.  Limit sweets and beverages with added sugar, such as soda, to no more than 5 servings per week. One   serving is:   1 tablespoon sugar.  1 tablespoon jelly or jam.   cup sorbet.  1 cup lemonade.   cup regular soda. CHOOSING FOODS Starches  Allowed: Breads: All kinds (wheat, rye, raisin, white, oatmeal, Italian, French, and English muffin bread). Low-fat rolls: English muffins, frankfurter and hamburger buns, bagels, pita bread, tortillas (not fried). Pancakes, waffles, biscuits, and muffins made with recommended oil.  Avoid: Products made with  saturated or trans fats, oils, or whole milk products. Butter rolls, cheese breads, croissants. Commercial doughnuts, muffins, sweet rolls, biscuits, waffles, pancakes, store-bought mixes. Crackers  Allowed: Low-fat crackers and snacks: Animal, graham, rye, saltine (with recommended oil, no lard), oyster, and matzo crackers. Bread sticks, melba toast, rusks, flatbread, pretzels, and light popcorn.  Avoid: High-fat crackers: cheese crackers, butter crackers, and those made with coconut, palm oil, or trans fat (hydrogenated oils). Buttered popcorn. Cereals  Allowed: Hot or cold whole-grain cereals.  Avoid: Cereals containing coconut, hydrogenated vegetable fat, or animal fat. Potatoes / Pasta / Rice  Allowed: All kinds of potatoes, rice, and pasta (such as macaroni, spaghetti, and noodles).  Avoid: Pasta or rice prepared with cream sauce or high-fat cheese. Chow mein noodles, French fries. Vegetables  Allowed: All vegetables and vegetable juices.  Avoid: Fried vegetables. Vegetables in cream, butter, or high-fat cheese sauces. Limit coconut. Fruit in cream or custard. Protein  Allowed: Limit your intake of meat, seafood, and poultry to no more than 6 oz (cooked weight) per day. All lean, well-trimmed beef, veal, pork, and lamb. All chicken and turkey without skin. All fish and shellfish. Wild game: wild duck, rabbit, pheasant, and venison. Egg whites or low-cholesterol egg substitutes may be used as desired. Meatless dishes: recipes with dried beans, peas, lentils, and tofu (soybean curd). Seeds and nuts: all seeds and most nuts.  Avoid: Prime grade and other heavily marbled and fatty meats, such as short ribs, spare ribs, rib eye roast or steak, frankfurters, sausage, bacon, and high-fat luncheon meats, mutton. Caviar. Commercially fried fish. Domestic duck, goose, venison sausage. Organ meats: liver, gizzard, heart, chitterlings, brains, kidney, sweetbreads. Dairy  Allowed: Low-fat  cheeses: nonfat or low-fat cottage cheese (1% or 2% fat), cheeses made with part skim milk, such as mozzarella, farmers, string, or ricotta. (Cheeses should be labeled no more than 2 to 6 grams fat per oz.). Skim (or 1%) milk: liquid, powdered, or evaporated. Buttermilk made with low-fat milk. Drinks made with skim or low-fat milk or cocoa. Chocolate milk or cocoa made with skim or low-fat (1%) milk. Nonfat or low-fat yogurt.  Avoid: Whole milk cheeses, including colby, cheddar, muenster, Monterey Jack, Havarti, Brie, Camembert, American, Swiss, and blue. Creamed cottage cheese, cream cheese. Whole milk and whole milk products, including buttermilk or yogurt made from whole milk, drinks made from whole milk. Condensed milk, evaporated whole milk, and 2% milk. Soups and Combination Foods  Allowed: Low-fat low-sodium soups: broth, dehydrated soups, homemade broth, soups with the fat removed, homemade cream soups made with skim or low-fat milk. Low-fat spaghetti, lasagna, chili, and Spanish rice if low-fat ingredients and low-fat cooking techniques are used.  Avoid: Cream soups made with whole milk, cream, or high-fat cheese. All other soups. Desserts and Sweets  Allowed: Sherbet, fruit ices, gelatins, meringues, and angel food cake. Homemade desserts with recommended fats, oils, and milk products. Jam, jelly, honey, marmalade, sugars, and syrups. Pure sugar candy, such as gum drops, hard candy, jelly beans, marshmallows, mints, and small amounts of dark chocolate.  Avoid: Commercially prepared   cakes, pies, cookies, frosting, pudding, or mixes for these products. Desserts containing whole milk products, chocolate, coconut, lard, palm oil, or palm kernel oil. Ice cream or ice cream drinks. Candy that contains chocolate, coconut, butter, hydrogenated fat, or unknown ingredients. Buttered syrups. Fats and Oils  Allowed: Vegetable oils: safflower, sunflower, corn, soybean, cottonseed, sesame, canola, olive,  or peanut. Non-hydrogenated margarines. Salad dressing or mayonnaise: homemade or commercial, made with a recommended oil. Low or nonfat salad dressing or mayonnaise.  Limit added fats and oils to 6 to 8 tsp per day (includes fats used in cooking, baking, salads, and spreads on bread). Remember to count the "hidden fats" in foods.  Avoid: Solid fats and shortenings: butter, lard, salt pork, bacon drippings. Gravy containing meat fat, shortening, or suet. Cocoa butter, coconut. Coconut oil, palm oil, palm kernel oil, or hydrogenated oils: these ingredients are often used in bakery products, nondairy creamers, whipped toppings, candy, and commercially fried foods. Read labels carefully. Salad dressings made of unknown oils, sour cream, or cheese, such as blue cheese and Roquefort. Cream, all kinds: half-and-half, light, heavy, or whipping. Sour cream or cream cheese (even if "light" or low-fat). Nondairy cream substitutes: coffee creamers and sour cream substitutes made with palm, palm kernel, hydrogenated oils, or coconut oil. Beverages  Allowed: Coffee (regular or decaffeinated), tea. Diet carbonated beverages, mineral water. Alcohol: Check with your caregiver. Moderation is recommended.  Avoid: Whole milk, regular sodas, and juice drinks with added sugar. Condiments  Allowed: All seasonings and condiments. Cocoa powder. "Cream" sauces made with recommended ingredients.  Avoid: Carob powder made with hydrogenated fats. SAMPLE MENU Breakfast   cup orange juice   cup oatmeal  1 slice toast  1 tsp margarine  1 cup skim milk Lunch  Turkey sandwich with 2 oz turkey, 2 slices bread  Lettuce and tomato slices  Fresh fruit  Carrot sticks  Coffee or tea Snack  Fresh fruit or low-fat crackers Dinner  3 oz lean ground beef  1 baked potato  1 tsp margarine   cup asparagus  Lettuce salad  1 tbs non-creamy dressing   cup peach slices  1 cup skim milk Document Released:  09/02/2008 Document Revised: 05/25/2012 Document Reviewed: 01/24/2014 [employer] Patient Information 2015 [employer]. This information is not intended to replace advice given to you by your health care provider. Make sure you discuss any questions you have with your health care provider.  

## 2014-08-16 NOTE — Discharge Summary (Signed)
Physician Discharge Summary  Heather [name] YHC:[id number] DOB: [birth year] DOA: 08/13/2014  PCP: Heather Cowden, MD  Admit date: 08/13/2014 Discharge date: 08/16/2014  Time spent: >45 minutes   Discharge Condition: stable Diet recommendation: heart healthy  Discharge Diagnoses:  Principal Problem:   Syncope Active Problems:   HYPOTHYROIDISM   DEPRESSION   HYPERTENSION   GERD   UTI (lower urinary tract infection)   Rhabdomyolysis   History of present illness:  79 y/o female who lives at home by herself, ambulates without assistance, presents with syncope. The patient reports she woke up on the floor this afternoon, face down. She does not remember waking up or trying to walk, but the TV was on, which she usually turns on after she wakes up. She remember only going to bed yesterday night, patient was too weak she could not stand up secondary to her generalized weakness and hip pain, where she had to crawl on the floor to call EMS, orthostatic could not be checked in ED secondary to hip pain, x-ray of the hips did not show any acute fracture, patient workup was only significant for UTI, and elevated total CK, no leukocytosis, no significant electrolyte abnormalities, no renal failure, CT head did not show any acute findings, patient denies any urinary or stool incontinence, there was no tongue bite, she denies any focal deficits, tingling numbness, slurred speech, altered mental status, hospitalist requested to admit the patient for further evaluation.   Hospital Course:  Syncope/generalized weakness - etiology uncertain and symptoms resolved - orthostatic vitals negative - no arrhythmias noted on Telemetry - CT head negative - Carotid duplex reveal 1-395 ICA stenosis b/l and antegrade flow in Vertebral arteries - no focal weakness. Echo Systolic function was normal. The estimated ejection fraction was in the range of 55% to 60%.  - has ambulated in hall today and has no  complaints - will be going to reside with her son who will watch her for further spells at home   UTI - culture not sent - given Rocephin and then switched to Cipro- has had 4 days of antibiotic- repeat UA negative  Rhabdomyolysis  -improved- hydration stopped  HYPOTHYROIDISM  - cont synthroid.   DEPRESSION  Continue with home medication.   Hypertension:  Blood pressure is acceptable, continue with Cardizem    Procedures:  none  Consultations:  none  Discharge Exam: Filed Weights   08/14/14 0600 08/15/14 0455 08/16/14 0509  Weight: 55.2 kg (121 lb 11.1 oz) 55.4 kg (122 lb 2.2 oz) 55.3 kg (121 lb 14.6 oz)   Filed Vitals:   08/16/14 1320  BP: 117/48  Pulse: 71  Temp: 98.5 F (36.9 C)  Resp: 18    General: AAO x 3, no distress Cardiovascular: RRR, no murmurs  Respiratory: clear to auscultation bilaterally GI: soft, non-tender, non-distended, bowel sound positive  Discharge Instructions You were cared for by a hospitalist during your hospital stay. If you have any questions about your discharge medications or the care you received while you were in the hospital after you are discharged, you can call the unit and asked to speak with the hospitalist on call if the hospitalist that took care of you is not available. Once you are discharged, your primary care physician will handle any further medical issues. Please note that NO REFILLS for any discharge medications will be authorized once you are discharged, as it is imperative that you return to your primary care physician (or establish a relationship with a primary care  physician if you do not have one) for your aftercare needs so that they can reassess your need for medications and monitor your lab values.     Medication List    ASK your doctor about these medications       Cholecalciferol 2000 UNITS Tabs  Take 2,000 Units by mouth daily.     clorazepate 7.5 MG tablet  Commonly known as:  TRANXENE  Take 7.5 mg  by mouth 2 (two) times daily as needed for anxiety.     diltiazem 240 MG 24 hr capsule  Commonly known as:  CARDIZEM CD  Take 240 mg by mouth daily.     esomeprazole 40 MG capsule  Commonly known as:  NEXIUM  Take 1 capsule (40 mg total) by mouth daily before breakfast.     ferrous sulfate 325 (65 FE) MG tablet  Take 325 mg by mouth daily with breakfast.     levothyroxine 25 MCG tablet  Commonly known as:  SYNTHROID, LEVOTHROID  Take 1 tablet (25 mcg total) by mouth daily before breakfast.     OVER THE COUNTER MEDICATION  Take 1 capsule by mouth daily. OTC laxative     pregabalin 50 MG capsule  Commonly known as:  LYRICA  Take 1 capsule (50 mg total) by mouth 3 (three) times daily.     promethazine 25 MG tablet  Commonly known as:  PHENERGAN  Take 25 mg by mouth every 6 (six) hours as needed for nausea.     sertraline 25 MG tablet  Commonly known as:  ZOLOFT  Take 25 mg by mouth daily.     SYSTANE OP  Place 1 drop into both eyes 2 (two) times daily.     traMADol 50 MG tablet  Commonly known as:  ULTRAM  Take 50 mg by mouth every 6 (six) hours as needed for moderate pain.     traZODone 50 MG tablet  Commonly known as:  DESYREL  Take 0.5-1 tablets (25-50 mg total) by mouth at bedtime as needed for sleep.       Allergies  Allergen Reactions  . Symbicort [Budesonide-Formoterol Fumarate] Other (See Comments)    Pt felt like her tongue was swollen, could not swallow  . Duloxetine Other (See Comments)    Patient doesn't recall  . Metronidazole Other (See Comments)    Patient doesn't recall   . Rofecoxib Other (See Comments)    Patient doesn't recall        Follow-up Information   Follow up with [facility]. Occupational hygienist, Physical Therapy )    Contact information:   [address] [place] [address] [phone]        The results of significant diagnostics from this hospitalization (including imaging, microbiology, ancillary  and laboratory) are listed below for reference.    Significant Diagnostic Studies: Dg Hip Bilateral W/pelvis  08/13/2014   CLINICAL DATA:  Lateral right hip pain and left hip pain, fall today  EXAM: BILATERAL HIP WITH PELVIS - 4+ VIEW  COMPARISON:  CT 10/03/2013  FINDINGS: No displaced pelvic fracture. Bilateral proximal femora appear unremarkable and the femoral heads appear properly located. No soft tissue abnormality. Normal visualized bowel gas pattern.  IMPRESSION: No bilateral hip or pelvic fracture identified.   Electronically Signed   By: Conchita Paris M.D.   On: 08/13/2014 15:16   Ct Head Wo Contrast  08/13/2014   CLINICAL DATA:  Patient found down, altered mental status, confusion  EXAM: CT  HEAD WITHOUT CONTRAST  TECHNIQUE: Contiguous axial images were obtained from the base of the skull through the vertex without intravenous contrast.  COMPARISON:  Brain MRI 02/19/2011, head CT 02/18/2011  FINDINGS: Mild cortical volume loss noted with proportional ventricular prominence. Areas of periventricular white matter hypodensity are most compatible with small vessel ischemic change. No acute hemorrhage, infarct, or mass lesion is identified. No significant change since prior exam. No skull fracture. A focus of trabecular rarefaction within the left occipital bone image 26 is stable. Mild mucoperiosteal thickening is identified in the ethmoid and sphenoid sinuses. Globes are grossly unremarkable.  IMPRESSION: No acute intracranial finding or significant interval change.   Electronically Signed   By: Conchita Paris M.D.   On: 08/13/2014 16:39    Microbiology: No results found for this or any previous visit (from the past 240 hour(s)).   Labs: Basic Metabolic Panel:  Recent Labs Lab 08/13/14 1408 08/14/14 0210  NA 143 146  K 4.3 3.4*  CL 102 106  CO2 28 28  GLUCOSE 110* 101*  BUN 10 8  CREATININE 0.78 0.77  CALCIUM 9.5 8.5   Liver Function Tests: No results found for this basename:  AST, ALT, ALKPHOS, BILITOT, PROT, ALBUMIN,  in the last 168 hours No results found for this basename: LIPASE, AMYLASE,  in the last 168 hours No results found for this basename: AMMONIA,  in the last 168 hours CBC:  Recent Labs Lab 08/13/14 1408 08/14/14 0210  WBC 11.2* 7.0  NEUTROABS 8.4*  --   HGB 13.7 11.2*  HCT 41.6 34.3*  MCV 90.8 91.5  PLT 362 290   Cardiac Enzymes:  Recent Labs Lab 08/13/14 1408 08/13/14 2040 08/14/14 0210 08/14/14 0652  CKTOTAL 573*  --   --  441*  CKMB  --   --   --  4.6*  TROPONINI <0.30 <0.30 <0.30 <0.30   BNP: BNP (last 3 results) No results found for this basename: PROBNP,  in the last 8760 hours CBG: No results found for this basename: GLUCAP,  in the last 168 hours     Signed:  Debbe Odea, MD [facility] 08/16/2014, 3:48 PM

## 2014-08-17 DIAGNOSIS — F3289 Other specified depressive episodes: Secondary | ICD-10-CM | POA: Diagnosis not present

## 2014-08-17 DIAGNOSIS — F329 Major depressive disorder, single episode, unspecified: Secondary | ICD-10-CM | POA: Diagnosis not present

## 2014-08-17 DIAGNOSIS — M545 Low back pain, unspecified: Secondary | ICD-10-CM | POA: Diagnosis not present

## 2014-08-17 DIAGNOSIS — F411 Generalized anxiety disorder: Secondary | ICD-10-CM | POA: Diagnosis not present

## 2014-08-17 DIAGNOSIS — I10 Essential (primary) hypertension: Secondary | ICD-10-CM | POA: Diagnosis not present

## 2014-08-17 DIAGNOSIS — W19XXXA Unspecified fall, initial encounter: Secondary | ICD-10-CM | POA: Diagnosis not present

## 2014-08-17 DIAGNOSIS — M25559 Pain in unspecified hip: Secondary | ICD-10-CM | POA: Diagnosis not present

## 2014-08-17 NOTE — Progress Notes (Signed)
CSW Armed forces technical officer) spoke with pt and pt son at bedside. They informed CSW that after speaking with MD they have decided for pt to dc home with son. At this time, pt has no hospital social work needs. CSW signing off.  [place], [place]

## 2014-08-18 ENCOUNTER — Telehealth: Payer: Self-pay | Admitting: Internal Medicine

## 2014-08-18 NOTE — Telephone Encounter (Signed)
Pt dc'd from hospital on 9/9. Pt had UTI.   Pt did not come home w/ any meds.  Pt is begining to act a little "loopy". And he is concerned UTI was not cleared up in 3 days w/ no meds or iv only.  Son did call the hospital and they instructed him to call her PCP. He states they implied once they are discharged, that's it.he wants to know if you think she shuold be on any other meds for this. Also, son wants to know if you think we need to see pt prior to her scheduled 10/2 appt?

## 2014-08-18 NOTE — Telephone Encounter (Signed)
Spoke to pt's son [name], told him pt does not need anymore antibiotics for UTI, infection was clear when she left the hospital. [name] verbalized understanding. Asked him if she has a hospital follow up next week. [name] said no. Appointment scheduled for Friday Sept 18 th at 10:30 with Dr. Raliegh Ip for hospital follow up. [name] verbalized understanding.

## 2014-08-21 DIAGNOSIS — I10 Essential (primary) hypertension: Secondary | ICD-10-CM | POA: Diagnosis not present

## 2014-08-21 DIAGNOSIS — W19XXXA Unspecified fall, initial encounter: Secondary | ICD-10-CM | POA: Diagnosis not present

## 2014-08-21 DIAGNOSIS — M545 Low back pain, unspecified: Secondary | ICD-10-CM | POA: Diagnosis not present

## 2014-08-21 DIAGNOSIS — M25559 Pain in unspecified hip: Secondary | ICD-10-CM | POA: Diagnosis not present

## 2014-08-21 DIAGNOSIS — F3289 Other specified depressive episodes: Secondary | ICD-10-CM | POA: Diagnosis not present

## 2014-08-21 DIAGNOSIS — F411 Generalized anxiety disorder: Secondary | ICD-10-CM | POA: Diagnosis not present

## 2014-08-21 DIAGNOSIS — F329 Major depressive disorder, single episode, unspecified: Secondary | ICD-10-CM | POA: Diagnosis not present

## 2014-08-22 ENCOUNTER — Emergency Department (HOSPITAL_COMMUNITY): Payer: Medicare Other

## 2014-08-22 ENCOUNTER — Encounter (HOSPITAL_COMMUNITY): Payer: Self-pay | Admitting: Emergency Medicine

## 2014-08-22 ENCOUNTER — Emergency Department (HOSPITAL_COMMUNITY)
Admission: EM | Admit: 2014-08-22 | Discharge: 2014-08-22 | Disposition: A | Discharge status: Home / Self Care | Payer: Medicare Other | Attending: Emergency Medicine | Admitting: Emergency Medicine

## 2014-08-22 DIAGNOSIS — F329 Major depressive disorder, single episode, unspecified: Secondary | ICD-10-CM | POA: Insufficient documentation

## 2014-08-22 DIAGNOSIS — I10 Essential (primary) hypertension: Secondary | ICD-10-CM | POA: Insufficient documentation

## 2014-08-22 DIAGNOSIS — K219 Gastro-esophageal reflux disease without esophagitis: Secondary | ICD-10-CM | POA: Insufficient documentation

## 2014-08-22 DIAGNOSIS — Z8719 Personal history of other diseases of the digestive system: Secondary | ICD-10-CM | POA: Diagnosis not present

## 2014-08-22 DIAGNOSIS — G8929 Other chronic pain: Secondary | ICD-10-CM | POA: Diagnosis not present

## 2014-08-22 DIAGNOSIS — Z79899 Other long term (current) drug therapy: Secondary | ICD-10-CM | POA: Insufficient documentation

## 2014-08-22 DIAGNOSIS — K921 Melena: Secondary | ICD-10-CM | POA: Diagnosis not present

## 2014-08-22 DIAGNOSIS — F411 Generalized anxiety disorder: Secondary | ICD-10-CM | POA: Diagnosis not present

## 2014-08-22 DIAGNOSIS — F3289 Other specified depressive episodes: Secondary | ICD-10-CM | POA: Diagnosis not present

## 2014-08-22 DIAGNOSIS — Z8619 Personal history of other infectious and parasitic diseases: Secondary | ICD-10-CM | POA: Diagnosis not present

## 2014-08-22 DIAGNOSIS — K5900 Constipation, unspecified: Secondary | ICD-10-CM | POA: Insufficient documentation

## 2014-08-22 DIAGNOSIS — K2990 Gastroduodenitis, unspecified, without bleeding: Secondary | ICD-10-CM | POA: Diagnosis not present

## 2014-08-22 DIAGNOSIS — E039 Hypothyroidism, unspecified: Secondary | ICD-10-CM | POA: Diagnosis not present

## 2014-08-22 DIAGNOSIS — R1032 Left lower quadrant pain: Secondary | ICD-10-CM | POA: Diagnosis not present

## 2014-08-22 DIAGNOSIS — R109 Unspecified abdominal pain: Secondary | ICD-10-CM | POA: Insufficient documentation

## 2014-08-22 DIAGNOSIS — K2970 Gastritis, unspecified, without bleeding: Secondary | ICD-10-CM | POA: Diagnosis not present

## 2014-08-22 LAB — URINALYSIS, ROUTINE W REFLEX MICROSCOPIC
Bilirubin Urine: NEGATIVE
Glucose, UA: NEGATIVE mg/dL
Hgb urine dipstick: NEGATIVE
Ketones, ur: NEGATIVE mg/dL
Leukocytes, UA: NEGATIVE
Nitrite: NEGATIVE
Protein, ur: NEGATIVE mg/dL
Specific Gravity, Urine: 1.011 (ref 1.005–1.030)
Urobilinogen, UA: 0.2 mg/dL (ref 0.0–1.0)
pH: 6 (ref 5.0–8.0)

## 2014-08-22 LAB — COMPREHENSIVE METABOLIC PANEL
ALT: 9 U/L (ref 0–35)
AST: 15 U/L (ref 0–37)
Albumin: 3.5 g/dL (ref 3.5–5.2)
Alkaline Phosphatase: 109 U/L (ref 39–117)
Anion gap: 15 (ref 5–15)
BUN: 11 mg/dL (ref 6–23)
CO2: 24 mEq/L (ref 19–32)
Calcium: 9.4 mg/dL (ref 8.4–10.5)
Chloride: 104 mEq/L (ref 96–112)
Creatinine, Ser: 0.72 mg/dL (ref 0.50–1.10)
GFR calc Af Amer: >90 mL/min (ref >90)
GFR calc non Af Amer: 81 mL/min — ABNORMAL LOW (ref >90)
Glucose, Bld: 115 mg/dL — ABNORMAL HIGH (ref 70–99)
Potassium: 3.6 mEq/L — ABNORMAL LOW (ref 3.7–5.3)
Sodium: 143 mEq/L (ref 137–147)
Total Bilirubin: 0.6 mg/dL (ref 0.3–1.2)
Total Protein: 7 g/dL (ref 6.0–8.3)

## 2014-08-22 LAB — CBC WITH DIFFERENTIAL/PLATELET
Basophils Absolute: 0 10*3/uL (ref 0.0–0.1)
Basophils Relative: 0 % (ref 0–1)
Eosinophils Absolute: 0 10*3/uL (ref 0.0–0.7)
Eosinophils Relative: 0 % (ref 0–5)
HCT: 40.2 % (ref 36.0–46.0)
Hemoglobin: 13.4 g/dL (ref 12.0–15.0)
Lymphocytes Relative: 16 % (ref 12–46)
Lymphs Abs: 1.6 10*3/uL (ref 0.7–4.0)
MCH: 29.3 pg (ref 26.0–34.0)
MCHC: 33.3 g/dL (ref 30.0–36.0)
MCV: 87.8 fL (ref 78.0–100.0)
Monocytes Absolute: 0.8 10*3/uL (ref 0.1–1.0)
Monocytes Relative: 8 % (ref 3–12)
Neutro Abs: 7.9 10*3/uL — ABNORMAL HIGH (ref 1.7–7.7)
Neutrophils Relative %: 76 % (ref 43–77)
Platelets: 452 10*3/uL — ABNORMAL HIGH (ref 150–400)
RBC: 4.58 MIL/uL (ref 3.87–5.11)
RDW: 13.5 % (ref 11.5–15.5)
WBC: 10.4 10*3/uL (ref 4.0–10.5)

## 2014-08-22 LAB — LIPASE, BLOOD: Lipase: 18 U/L (ref 11–59)

## 2014-08-22 MED ORDER — POLYETHYLENE GLYCOL 3350 17 G PO PACK: 17.0000 g | PACK | Freq: Every day | ORAL | Status: DC

## 2014-08-22 MED ORDER — MORPHINE SULFATE 2 MG/ML IJ SOLN
2.0000 mg | Freq: Once | INTRAMUSCULAR | Status: AC
  Administered 2014-08-22: 2 mg via INTRAVENOUS
  Filled 2014-08-22: qty 1

## 2014-08-22 MED ORDER — ONDANSETRON HCL 4 MG/2ML IJ SOLN
4.0000 mg | Freq: Once | INTRAMUSCULAR | Status: AC
  Administered 2014-08-22: 4 mg via INTRAVENOUS
  Filled 2014-08-22: qty 2

## 2014-08-22 MED ORDER — IOHEXOL 300 MG/ML  SOLN: 25.0000 mL | Freq: Once | INTRAMUSCULAR | Status: DC | PRN

## 2014-08-22 MED ORDER — IOHEXOL 300 MG/ML  SOLN
80.0000 mL | Freq: Once | INTRAMUSCULAR | Status: AC | PRN
  Administered 2014-08-22: 80 mL via INTRAVENOUS

## 2014-08-22 MED ORDER — MORPHINE SULFATE 4 MG/ML IJ SOLN
4.0000 mg | Freq: Once | INTRAMUSCULAR | Status: AC
  Administered 2014-08-22: 4 mg via INTRAVENOUS
  Filled 2014-08-22: qty 1

## 2014-08-22 MED ORDER — MAGNESIUM SULFATE (LAXATIVE) POWD: 10.0000 g | Freq: Two times a day (BID) | Status: DC | PRN

## 2014-08-22 NOTE — ED Notes (Signed)
Pt reports LLQ abd pain for 2 days; reports constipation for 2 days; had 4 small BMs today with dark blood then had some relief; has IBS. BP 120/60, HR 86

## 2014-08-22 NOTE — ED Provider Notes (Signed)
CSN: [id number]     Arrival date & time 08/22/14  0910 History   First MD Initiated Contact with Patient 08/22/14 0919     Chief Complaint  Patient presents with  . Abdominal Pain     (Consider location/radiation/quality/duration/timing/severity/associated sxs/prior Treatment) HPI Pt is a 79yo female with hx of diverticulosis, GERD, IBS, and crohn's presenting to ED with c/o 2 day hx of gradually worsening LLQ pain, associated with constipation and hematochezia.  Pt states she had 4 small BMs today with dark blood, but then had some relief of abdominal pain.  Pt states pain is sharp and cramping in lower abdomen, worse in LLQ, 8/10 at worst.  States she has had some chills but no fevers. Reports dysuria but no hematuria. Reports being admitted just last week for syncope and was found to have a UTI, pt was given antibiotics while she was admitted.  Denies sick contacts or recent travel. States pain does feel similar to previous exacerbations of her IBS.  Pt is seen by Dr. [name], GI specialist.   Past Medical History  Diagnosis Date  . ANXIETY 07/23/2009  . DEPRESSION 07/23/2009  . DIVERTICULOSIS, COLON 07/23/2009  . GERD 07/23/2009  . HIP PAIN, LEFT 05/16/2010  . HYPERLIPIDEMIA 07/23/2009  . HYPERTENSION 07/23/2009  . HYPOTHYROIDISM 07/23/2009  . LOW BACK PAIN, CHRONIC 10/01/2009  . PARESTHESIA 10/01/2009  . IBS (irritable bowel syndrome)   . History of Crohn's disease   . Chronic pain   . Hypothyroidism   . Insomnia   . Headache(784.0) 07/23/2009    occasional  . Shingles 2006    back  . Hemorrhoids   . Complication of anesthesia 7 or 8 yrs ago    woke up during colonscopy   Past Surgical History  Procedure Laterality Date  . Tonsillectomy  yrs ago  . Hemorrhoid surgery  yrs ago  . Appendectomy  yrs ago  . Bilateral salpingoophorectomy  age 30 or 63  . Abdominal hysterectomy  age 22 or 60  . Colonscopy  7 or 8 yrs ago  . Esophagogastroduodenoscopy N/A 11/17/2013    Procedure:  ESOPHAGOGASTRODUODENOSCOPY (EGD);  Surgeon: Cleotis Nipper, MD;  Location: [facility];  Service: Endoscopy;  Laterality: N/A;  . Colonoscopy N/A 11/17/2013    Procedure: COLONOSCOPY;  Surgeon: Cleotis Nipper, MD;  Location: [facility];  Service: Endoscopy;  Laterality: N/A;   No family history on file. History  Substance Use Topics  . Smoking status: Never Smoker   . Smokeless tobacco: Never Used  . Alcohol Use: No   OB History   Grav Para Term Preterm Abortions TAB SAB Ect Mult Living                 Review of Systems  Constitutional: Negative for fever and chills.  Respiratory: Negative for cough and shortness of breath.   Cardiovascular: Negative for chest pain and palpitations.  Gastrointestinal: Positive for nausea, abdominal pain ( suprapubic and LLQ), constipation and blood in stool. Negative for vomiting and diarrhea.  Genitourinary: Positive for dysuria. Negative for urgency, hematuria and flank pain.  Musculoskeletal: Negative for back pain and myalgias.  All other systems reviewed and are negative.     Allergies  Symbicort; Duloxetine; Metronidazole; and Rofecoxib  Home Medications   Prior to Admission medications   Medication Sig Start Date End Date Taking? Authorizing Provider  Cholecalciferol 2000 UNITS TABS Take 2,000 Units by mouth daily.   Yes Historical Provider, MD  clorazepate (TRANXENE) 7.5 MG tablet  Take 7.5 mg by mouth 2 (two) times daily as needed for anxiety.   Yes Historical Provider, MD  diltiazem (CARDIZEM CD) 240 MG 24 hr capsule Take 240 mg by mouth daily.   Yes Historical Provider, MD  esomeprazole (NEXIUM) 40 MG capsule Take 1 capsule (40 mg total) by mouth daily before breakfast. 01/31/14  Yes [name], MD  levothyroxine (SYNTHROID, LEVOTHROID) 25 MCG tablet Take 1 tablet (25 mcg total) by mouth daily before breakfast. 03/29/14  Yes [name], MD  OVER THE COUNTER MEDICATION Take 1 capsule by mouth daily as needed  (for constipation. Dulcolax OTC).    Yes Historical Provider, MD  Polyethyl Glycol-Propyl Glycol (SYSTANE OP) Place 1 drop into both eyes 2 (two) times daily.    Yes Historical Provider, MD  pregabalin (LYRICA) 50 MG capsule Take 1 capsule (50 mg total) by mouth 3 (three) times daily. 01/31/14  Yes [name], MD  promethazine (PHENERGAN) 25 MG tablet Take 25 mg by mouth every 6 (six) hours as needed for nausea.   Yes Historical Provider, MD  sertraline (ZOLOFT) 25 MG tablet Take 25 mg by mouth daily.   Yes Historical Provider, MD  traMADol (ULTRAM) 50 MG tablet Take 50 mg by mouth every 6 (six) hours as needed for moderate pain.   Yes Historical Provider, MD  traZODone (DESYREL) 50 MG tablet Take 0.5-1 tablets (25-50 mg total) by mouth at bedtime as needed for sleep. 03/29/14  Yes [name], MD  Magnesium Sulfate, Laxative, POWD 10 g by Does not apply route 2 (two) times daily as needed (for constipation). 08/22/14   Noland Fordyce, PA-C  polyethylene glycol (MIRALAX / GLYCOLAX) packet Take 17 g by mouth daily. 08/22/14   Noland Fordyce, PA-C   BP 167/68  Pulse 83  Temp(Src) 98.1 F (36.7 C) (Oral)  Resp 14  SpO2 91% Physical Exam  Nursing note and vitals reviewed. Constitutional: She appears well-developed and well-nourished. No distress.  Elderly female lying comfortably in exam bed, NAD  HENT:  Head: Normocephalic and atraumatic.  Eyes: Conjunctivae are normal. No scleral icterus.  Neck: Normal range of motion.  Cardiovascular: Normal rate, regular rhythm and normal heart sounds.   Pulmonary/Chest: Effort normal and breath sounds normal. No respiratory distress. She has no wheezes. She has no rales. She exhibits no tenderness.  Abdominal: Soft. Bowel sounds are normal. She exhibits no distension and no mass. There is tenderness. There is guarding. There is no rebound.  Soft, non-distended. Tenderness in suprapubic region and LLQ with voluntary guarding of LLQ.  No rebound  or masses. No CVAT  Musculoskeletal: Normal range of motion.  Neurological: She is alert.  Skin: Skin is warm and dry. She is not diaphoretic.    ED Course  Procedures (including critical care time) Labs Review Labs Reviewed  CBC WITH DIFFERENTIAL - Abnormal; Notable for the following:    Platelets 452 (*)    Neutro Abs 7.9 (*)    All other components within normal limits  COMPREHENSIVE METABOLIC PANEL - Abnormal; Notable for the following:    Potassium 3.6 (*)    Glucose, Bld 115 (*)    GFR calc non Af Amer 81 (*)    All other components within normal limits  URINALYSIS, ROUTINE W REFLEX MICROSCOPIC - Abnormal; Notable for the following:    Color, Urine AMBER (*)    All other components within normal limits  URINE CULTURE  LIPASE, BLOOD    Imaging Review Ct  Abdomen Pelvis W Contrast  08/22/2014   CLINICAL DATA:  Two days of left lower quadrant pain and constipation, melena  EXAM: CT ABDOMEN AND PELVIS WITH CONTRAST  TECHNIQUE: Multidetector CT imaging of the abdomen and pelvis was performed using the standard protocol following bolus administration of intravenous contrast.  CONTRAST:  87mL OMNIPAQUE IOHEXOL 300 MG/ML SOLN intravenously; the patient also received oral contrast material.  COMPARISON:  Abdominal and pelvic CT scan of October 03, 2013  FINDINGS: There is a moderately increased stool burden in the left side of the colon. Oral contrast has not yet reached the majority of the left colon. There is an area of focal narrowing in the mid descending colon demonstrated on image 29 of series 201, on coronal image number 50 of series 203, and on sagittal image 118 of series 204. There are scattered sigmoid diverticula without evidence of acute diverticulitis. More proximally the colon exhibits a normal contrast and gas pattern. The appendix is surgically absent.  The liver, pancreas, spleen, nondistended stomach, adrenal glands, and kidneys exhibit no acute abnormalities. There is a  retroaortic left renal vein. The caliber of the abdominal aorta is normal. The gallbladder is mildly distended. A faint intraluminal radiodensity is demonstrated on image 25 which could reflect a faintly calcified stone.  Within the pelvis the urinary bladder is moderately distended but grossly normal. The uterus and adnexal structures are surgically absent. There is no free pelvic fluid. There is no inguinal hernia. There is a tiny umbilical hernia containing fat.  The lumbar spine and bony pelvis are unremarkable. The lung bases are clear.  IMPRESSION: 1. Increased stool burden within the left colon likely reflects clinical constipation. An area of focal luminal narrowing in the mid transverse colon is demonstrated. Direct visualization is recommended to exclude an annular constricting lesion in the mid transverse colon. 2. There is no objective evidence of colitis or diverticulitis. There is sigmoid diverticulosis. 3. A tiny gallstone may be present. Otherwise there is no acute hepatobiliary abnormality. 4. There is no acute urinary tract abnormality.   Electronically Signed   By: David  Martinique   On: 08/22/2014 12:02     EKG Interpretation None      MDM   Final diagnoses:  Constipation, unspecified constipation type  Abdominal pain, left lower quadrant    Pt with hx of IBS presenting to ED with c/o LLQ pain associated with constipation and dark blood in stool that provided some relief after 4 small BMs today.  Pt appears well, non-toxic. NAD.  Moderate tenderness in LLQ with voluntary guarding but no rebound or masses.   Labs: unremarkable. CT abd: consistent with increased stool burden w/n left colon, clinical constipation. An area of focal luminal narrowing in mid transverse colon is demonstrated, recommend direct visualization. Discussed findings with pt, pt states she just had a colonoscopy by Dr. [name] in August 2015. Advised pt to still advised Dr. [name] of today's findings to ensure he  is aware of mid transverse colon narrowing.    No evidence of emergent process taking place at this time including SBO, diverticulitis, pyelonephritis. Will tx for constipation. Pt discharged home with magnesium sulfate and miralax. Advised to f/u with PCP or GI specialist in 3-4 days for recheck of symptoms. Return precautions provided. Pt verbalized understanding and agreement with tx plan.  Discussed pt with Dr. Alvino Chapel who also examined pt, agrees with tx plan.     Noland Fordyce, PA-C 08/22/14 [phone]

## 2014-08-23 ENCOUNTER — Telehealth: Payer: Self-pay | Admitting: Internal Medicine

## 2014-08-23 DIAGNOSIS — M25559 Pain in unspecified hip: Secondary | ICD-10-CM | POA: Diagnosis not present

## 2014-08-23 DIAGNOSIS — F329 Major depressive disorder, single episode, unspecified: Secondary | ICD-10-CM | POA: Diagnosis not present

## 2014-08-23 DIAGNOSIS — M545 Low back pain, unspecified: Secondary | ICD-10-CM | POA: Diagnosis not present

## 2014-08-23 DIAGNOSIS — W19XXXA Unspecified fall, initial encounter: Secondary | ICD-10-CM | POA: Diagnosis not present

## 2014-08-23 DIAGNOSIS — F3289 Other specified depressive episodes: Secondary | ICD-10-CM | POA: Diagnosis not present

## 2014-08-23 DIAGNOSIS — F411 Generalized anxiety disorder: Secondary | ICD-10-CM | POA: Diagnosis not present

## 2014-08-23 DIAGNOSIS — I10 Essential (primary) hypertension: Secondary | ICD-10-CM | POA: Diagnosis not present

## 2014-08-23 LAB — URINE CULTURE
Colony Count: NO GROWTH
Culture: NO GROWTH

## 2014-08-23 NOTE — Telephone Encounter (Signed)
Heather [name] reports pt is independent and requires no further OT therapy

## 2014-08-23 NOTE — ED Provider Notes (Signed)
Medical screening examination/treatment/procedure(s) were performed by non-physician practitioner and as supervising physician I was immediately available for consultation/collaboration.   EKG Interpretation None       Jasper Riling. Alvino Chapel, MD 08/23/14 1500

## 2014-08-24 DIAGNOSIS — F3289 Other specified depressive episodes: Secondary | ICD-10-CM | POA: Diagnosis not present

## 2014-08-24 DIAGNOSIS — I10 Essential (primary) hypertension: Secondary | ICD-10-CM | POA: Diagnosis not present

## 2014-08-24 DIAGNOSIS — F329 Major depressive disorder, single episode, unspecified: Secondary | ICD-10-CM | POA: Diagnosis not present

## 2014-08-24 DIAGNOSIS — M545 Low back pain, unspecified: Secondary | ICD-10-CM | POA: Diagnosis not present

## 2014-08-24 DIAGNOSIS — M25559 Pain in unspecified hip: Secondary | ICD-10-CM | POA: Diagnosis not present

## 2014-08-24 DIAGNOSIS — F411 Generalized anxiety disorder: Secondary | ICD-10-CM | POA: Diagnosis not present

## 2014-08-24 DIAGNOSIS — W19XXXA Unspecified fall, initial encounter: Secondary | ICD-10-CM | POA: Diagnosis not present

## 2014-08-24 NOTE — Telephone Encounter (Signed)
FYI

## 2014-08-25 ENCOUNTER — Ambulatory Visit (INDEPENDENT_AMBULATORY_CARE_PROVIDER_SITE_OTHER): Payer: Medicare Other | Admitting: Internal Medicine

## 2014-08-25 ENCOUNTER — Encounter: Payer: Self-pay | Admitting: Internal Medicine

## 2014-08-25 VITALS — BP 110/60 | HR 64 | Temp 98.0°F | Resp 18 | Ht 59.0 in | Wt 119.0 lb

## 2014-08-25 DIAGNOSIS — E785 Hyperlipidemia, unspecified: Secondary | ICD-10-CM

## 2014-08-25 DIAGNOSIS — M545 Low back pain, unspecified: Secondary | ICD-10-CM

## 2014-08-25 DIAGNOSIS — Z23 Encounter for immunization: Secondary | ICD-10-CM | POA: Diagnosis not present

## 2014-08-25 DIAGNOSIS — F411 Generalized anxiety disorder: Secondary | ICD-10-CM | POA: Diagnosis not present

## 2014-08-25 DIAGNOSIS — F3289 Other specified depressive episodes: Secondary | ICD-10-CM | POA: Diagnosis not present

## 2014-08-25 DIAGNOSIS — I10 Essential (primary) hypertension: Secondary | ICD-10-CM | POA: Diagnosis not present

## 2014-08-25 DIAGNOSIS — M25559 Pain in unspecified hip: Secondary | ICD-10-CM | POA: Diagnosis not present

## 2014-08-25 DIAGNOSIS — F329 Major depressive disorder, single episode, unspecified: Secondary | ICD-10-CM | POA: Diagnosis not present

## 2014-08-25 DIAGNOSIS — W19XXXA Unspecified fall, initial encounter: Secondary | ICD-10-CM | POA: Diagnosis not present

## 2014-08-25 NOTE — Progress Notes (Signed)
Subjective:    Patient ID: Heather [name], female    DOB: 09-12-1935, 79 y.o.   MRN: [id number]  HPI  79 year old patient who has a history of hypertension, dyslipidemia, anxiety, depression. She was hostile as recently after a syncopal episode that was felt related to a UTI.  She also return to the hospital.  More recently, for evaluation of abdominal pain, thought secondary to constipation.  She has done quite well since her hospital release and states that she feels better today than she has in some time.  She is accompanied by her son.  She has received home physical therapy and briefly stayed with her son. She is using MiraLax and bowel habits are normal She did have a colonoscopy in October of last year Abdominal CT suggested a possible focal constriction in the mid transverse colon  Emergency department and hospital record reviewed  Wt Readings from Last 3 Encounters:  08/25/14 119 lb (53.978 kg)  08/16/14 121 lb 14.6 oz (55.3 kg)  03/29/14 125 lb (56.7 kg)    Past Medical History  Diagnosis Date  . ANXIETY 07/23/2009  . DEPRESSION 07/23/2009  . DIVERTICULOSIS, COLON 07/23/2009  . GERD 07/23/2009  . HIP PAIN, LEFT 05/16/2010  . HYPERLIPIDEMIA 07/23/2009  . HYPERTENSION 07/23/2009  . HYPOTHYROIDISM 07/23/2009  . LOW BACK PAIN, CHRONIC 10/01/2009  . PARESTHESIA 10/01/2009  . IBS (irritable bowel syndrome)   . History of Crohn's disease   . Chronic pain   . Hypothyroidism   . Insomnia   . Headache(784.0) 07/23/2009    occasional  . Shingles 2006    back  . Hemorrhoids   . Complication of anesthesia 7 or 8 yrs ago    woke up during colonscopy    History   Social History  . Marital Status: Widowed    Spouse Name: N/A    Number of Children: N/A  . Years of Education: N/A   Occupational History  . Not on file.   Social History Main Topics  . Smoking status: Never Smoker   . Smokeless tobacco: Never Used  . Alcohol Use: No  . Drug Use: No  . Sexual Activity:  Not on file   Other Topics Concern  . Not on file   Social History Narrative  . No narrative on file    Past Surgical History  Procedure Laterality Date  . Tonsillectomy  yrs ago  . Hemorrhoid surgery  yrs ago  . Appendectomy  yrs ago  . Bilateral salpingoophorectomy  age 31 or 10  . Abdominal hysterectomy  age 49 or 65  . Colonscopy  7 or 8 yrs ago  . Esophagogastroduodenoscopy N/A 11/17/2013    Procedure: ESOPHAGOGASTRODUODENOSCOPY (EGD);  Surgeon: Cleotis Nipper, MD;  Location: [facility];  Service: Endoscopy;  Laterality: N/A;  . Colonoscopy N/A 11/17/2013    Procedure: COLONOSCOPY;  Surgeon: Cleotis Nipper, MD;  Location: [facility];  Service: Endoscopy;  Laterality: N/A;    No family history on file.  Allergies  Allergen Reactions  . Symbicort [Budesonide-Formoterol Fumarate] Other (See Comments)    Pt felt like her tongue was swollen, could not swallow  . Duloxetine Other (See Comments)    Patient doesn't recall  . Metronidazole Other (See Comments)    Patient doesn't recall   . Rofecoxib Other (See Comments)    Patient doesn't recall     Current Outpatient Prescriptions on File Prior to Visit  Medication Sig Dispense Refill  . Cholecalciferol 2000 UNITS TABS  Take 2,000 Units by mouth daily.      . clorazepate (TRANXENE) 7.5 MG tablet Take 7.5 mg by mouth 2 (two) times daily as needed for anxiety.      [name] diltiazem (CARDIZEM CD) 240 MG 24 hr capsule Take 240 mg by mouth daily.      [name] esomeprazole (NEXIUM) 40 MG capsule Take 1 capsule (40 mg total) by mouth daily before breakfast.  90 capsule  3  . levothyroxine (SYNTHROID, LEVOTHROID) 25 MCG tablet Take 1 tablet (25 mcg total) by mouth daily before breakfast.  90 tablet  1  . Magnesium Sulfate, Laxative, POWD 10 g by Does not apply route 2 (two) times daily as needed (for constipation).  125 g  0  . OVER THE COUNTER MEDICATION Take 1 capsule by mouth daily as needed (for constipation. Dulcolax OTC).         Vladimir Faster Glycol-Propyl Glycol (SYSTANE OP) Place 1 drop into both eyes 2 (two) times daily.       . polyethylene glycol (MIRALAX / GLYCOLAX) packet Take 17 g by mouth daily.  14 each  0  . pregabalin (LYRICA) 50 MG capsule Take 1 capsule (50 mg total) by mouth 3 (three) times daily.  90 capsule  1  . promethazine (PHENERGAN) 25 MG tablet Take 25 mg by mouth every 6 (six) hours as needed for nausea.      . sertraline (ZOLOFT) 25 MG tablet Take 25 mg by mouth daily.      . traMADol (ULTRAM) 50 MG tablet Take 50 mg by mouth every 6 (six) hours as needed for moderate pain.      . traZODone (DESYREL) 50 MG tablet Take 0.5-1 tablets (25-50 mg total) by mouth at bedtime as needed for sleep.  60 tablet  3   No current facility-administered medications on file prior to visit.    BP 110/60  Pulse 64  Temp(Src) 98 F (36.7 C) (Oral)  Resp 18  Ht 4\' 11"  (1.499 m)  Wt 119 lb (53.978 kg)  BMI 24.02 kg/m2  SpO2 97%     Review of Systems  Constitutional: Negative.   HENT: Negative for congestion, dental problem, hearing loss, rhinorrhea, sinus pressure, sore throat and tinnitus.   Eyes: Negative for pain, discharge and visual disturbance.  Respiratory: Negative for cough and shortness of breath.   Cardiovascular: Negative for chest pain, palpitations and leg swelling.  Gastrointestinal: Negative for nausea, vomiting, abdominal pain, diarrhea, constipation, blood in stool and abdominal distention.  Genitourinary: Negative for dysuria, urgency, frequency, hematuria, flank pain, vaginal bleeding, vaginal discharge, difficulty urinating, vaginal pain and pelvic pain.  Musculoskeletal: Negative for arthralgias, gait problem and joint swelling.  Skin: Negative for rash.  Neurological: Negative for dizziness, syncope, speech difficulty, weakness, numbness and headaches.  Hematological: Negative for adenopathy.  Psychiatric/Behavioral: Negative for behavioral problems, dysphoric mood and agitation.  The patient is not nervous/anxious.        Objective:   Physical Exam  Constitutional: She is oriented to person, place, and time. She appears well-developed and well-nourished.  HENT:  Head: Normocephalic.  Right Ear: External ear normal.  Left Ear: External ear normal.  Mouth/Throat: Oropharynx is clear and moist.  Eyes: Conjunctivae and EOM are normal. Pupils are equal, round, and reactive to light.  Neck: Normal range of motion. Neck supple. No thyromegaly present.  Cardiovascular: Normal rate, regular rhythm, normal heart sounds and intact distal pulses.   Pulmonary/Chest: Effort normal and breath sounds normal.  Abdominal:  Soft. Bowel sounds are normal. She exhibits no distension and no mass. There is no tenderness. There is no rebound and no guarding.  Musculoskeletal: Normal range of motion.  Lymphadenopathy:    She has no cervical adenopathy.  Neurological: She is alert and oriented to person, place, and time.  Skin: Skin is warm and dry. No rash noted.  Psychiatric: She has a normal mood and affect. Her behavior is normal.          Assessment & Plan:  Abdominal pain, resolved Constipation improved History of UTI Anxiety depression Hypertension stable Hypothyroidism  Recheck 6 months Flu and Pneumovax, dispensed

## 2014-08-25 NOTE — Progress Notes (Signed)
Pre visit review using our clinic review tool, if applicable. No additional management support is needed unless otherwise documented below in the visit note. 

## 2014-08-25 NOTE — Patient Instructions (Signed)
It is important that you exercise regularly, at least 20 minutes 3 to 4 times per week.  If you develop chest pain or shortness of breath seek  medical attention. \Return in 6 months for follow-up  Call or return to clinic prn if these symptoms worsen or fail to improve as anticipated.

## 2014-08-28 ENCOUNTER — Other Ambulatory Visit: Payer: Self-pay | Admitting: Internal Medicine

## 2014-09-06 DIAGNOSIS — I10 Essential (primary) hypertension: Secondary | ICD-10-CM

## 2014-09-06 DIAGNOSIS — F3289 Other specified depressive episodes: Secondary | ICD-10-CM | POA: Diagnosis not present

## 2014-09-06 DIAGNOSIS — W19XXXA Unspecified fall, initial encounter: Secondary | ICD-10-CM

## 2014-09-06 DIAGNOSIS — M25559 Pain in unspecified hip: Secondary | ICD-10-CM | POA: Diagnosis not present

## 2014-09-06 DIAGNOSIS — M545 Low back pain, unspecified: Secondary | ICD-10-CM

## 2014-09-06 DIAGNOSIS — F329 Major depressive disorder, single episode, unspecified: Secondary | ICD-10-CM | POA: Diagnosis not present

## 2014-09-06 DIAGNOSIS — F411 Generalized anxiety disorder: Secondary | ICD-10-CM

## 2014-09-08 ENCOUNTER — Encounter: Payer: Medicare Other | Admitting: Internal Medicine

## 2014-09-15 ENCOUNTER — Telehealth: Payer: Self-pay | Admitting: Internal Medicine

## 2014-09-15 NOTE — Telephone Encounter (Signed)
Pt stated her trash cans or to heavy and she needs a note fax to [employer] [phone] to get small trash cans for a family of one.

## 2014-09-18 ENCOUNTER — Encounter: Payer: Self-pay | Admitting: *Deleted

## 2014-09-18 ENCOUNTER — Other Ambulatory Visit: Payer: Self-pay | Admitting: Internal Medicine

## 2014-09-18 NOTE — Telephone Encounter (Signed)
Please call pt once note has been fax

## 2014-09-19 NOTE — Telephone Encounter (Signed)
Pt called back told her letter was faxed to [facility]. Pt verbalized understanding.

## 2014-09-19 NOTE — Telephone Encounter (Signed)
Left message on voicemail to call office.  

## 2014-10-10 ENCOUNTER — Telehealth: Payer: Self-pay | Admitting: Internal Medicine

## 2014-10-10 NOTE — Telephone Encounter (Signed)
Pt needs a letter from dr k to  Have the  removal of her mailbox from street to put up beside/or on front porch . Please mail letter to home address

## 2014-10-12 NOTE — Telephone Encounter (Signed)
Spoke to pt, told her letter is done and it will go out in the mail tomorrow. Pt verbalized understanding. Letter mailed to pt.

## 2014-10-17 ENCOUNTER — Telehealth: Payer: Self-pay | Admitting: Internal Medicine

## 2014-10-17 MED ORDER — TRAMADOL HCL 50 MG PO TABS: 50.0000 mg | ORAL_TABLET | Freq: Four times a day (QID) | ORAL | Status: DC | PRN

## 2014-10-17 NOTE — Telephone Encounter (Signed)
Rx called in to pharmacy. 

## 2014-10-17 NOTE — Telephone Encounter (Signed)
[facility], [address]. Is requesting re-fill on traMADol (ULTRAM) 50 MG tablet

## 2014-10-30 ENCOUNTER — Other Ambulatory Visit: Payer: Self-pay | Admitting: Internal Medicine

## 2014-11-20 ENCOUNTER — Other Ambulatory Visit: Payer: Self-pay | Admitting: Internal Medicine

## 2014-12-10 ENCOUNTER — Other Ambulatory Visit: Payer: Self-pay | Admitting: Internal Medicine

## 2014-12-26 ENCOUNTER — Other Ambulatory Visit: Payer: Self-pay | Admitting: Internal Medicine

## 2015-01-08 ENCOUNTER — Encounter: Payer: Self-pay | Admitting: Internal Medicine

## 2015-01-08 ENCOUNTER — Encounter: Payer: Self-pay | Admitting: *Deleted

## 2015-01-08 ENCOUNTER — Ambulatory Visit (INDEPENDENT_AMBULATORY_CARE_PROVIDER_SITE_OTHER): Payer: Medicare Other | Admitting: Internal Medicine

## 2015-01-08 VITALS — BP 140/80 | HR 102 | Temp 97.2°F | Resp 18 | Ht 59.0 in | Wt 116.0 lb

## 2015-01-08 DIAGNOSIS — E785 Hyperlipidemia, unspecified: Secondary | ICD-10-CM

## 2015-01-08 DIAGNOSIS — I10 Essential (primary) hypertension: Secondary | ICD-10-CM | POA: Diagnosis not present

## 2015-01-08 MED ORDER — TRAMADOL HCL 50 MG PO TABS: 50.0000 mg | ORAL_TABLET | Freq: Four times a day (QID) | ORAL | Status: DC | PRN

## 2015-01-08 MED ORDER — PROMETHAZINE HCL 25 MG PO TABS: ORAL_TABLET | ORAL | Status: DC

## 2015-01-08 NOTE — Progress Notes (Signed)
Pre visit review using our clinic review tool, if applicable. No additional management support is needed unless otherwise documented below in the visit note. 

## 2015-01-08 NOTE — Progress Notes (Signed)
Subjective:    Patient ID: Heather [name], female    DOB: [birth year], 80 y.o.   MRN: [id number]  HPI 80 year old patient who has a long history of anxiety and depression.  Last week the patient developed nausea and vomiting, but states that she was too sick to come in at that time.  An appointment was made for today.  She is improved but still having neck pain.  She states that this has been present since her last visit here in September of last year.  She also states that she has noted some swollen glands in the neck for the past 2 months.  She was hospitalized for syncope in September of last year and had an abdominal CT scan at that time.  No further nausea or vomiting.  She generally feels unwell.  She lost her sister yesterday after a prolonged illness  Past Medical History  Diagnosis Date  . ANXIETY 07/23/2009  . DEPRESSION 07/23/2009  . DIVERTICULOSIS, COLON 07/23/2009  . GERD 07/23/2009  . HIP PAIN, LEFT 05/16/2010  . HYPERLIPIDEMIA 07/23/2009  . HYPERTENSION 07/23/2009  . HYPOTHYROIDISM 07/23/2009  . LOW BACK PAIN, CHRONIC 10/01/2009  . PARESTHESIA 10/01/2009  . IBS (irritable bowel syndrome)   . History of Crohn's disease   . Chronic pain   . Hypothyroidism   . Insomnia   . Headache(784.0) 07/23/2009    occasional  . Shingles 2006    back  . Hemorrhoids   . Complication of anesthesia 7 or 8 yrs ago    woke up during colonscopy    History   Social History  . Marital Status: Widowed    Spouse Name: N/A    Number of Children: N/A  . Years of Education: N/A   Occupational History  . Not on file.   Social History Main Topics  . Smoking status: Never Smoker   . Smokeless tobacco: Never Used  . Alcohol Use: No  . Drug Use: No  . Sexual Activity: Not on file   Other Topics Concern  . Not on file   Social History Narrative    Past Surgical History  Procedure Laterality Date  . Tonsillectomy  yrs ago  . Hemorrhoid surgery  yrs ago  . Appendectomy  yrs ago    . Bilateral salpingoophorectomy  age 76 or 52  . Abdominal hysterectomy  age 17 or 48  . Colonscopy  7 or 8 yrs ago  . Esophagogastroduodenoscopy N/A 11/17/2013    Procedure: ESOPHAGOGASTRODUODENOSCOPY (EGD);  Surgeon: Cleotis Nipper, MD;  Location: [facility];  Service: Endoscopy;  Laterality: N/A;  . Colonoscopy N/A 11/17/2013    Procedure: COLONOSCOPY;  Surgeon: Cleotis Nipper, MD;  Location: [facility];  Service: Endoscopy;  Laterality: N/A;    No family history on file.  Allergies  Allergen Reactions  . Symbicort [Budesonide-Formoterol Fumarate] Other (See Comments)    Pt felt like her tongue was swollen, could not swallow  . Duloxetine Other (See Comments)    Patient doesn't recall  . Metronidazole Other (See Comments)    Patient doesn't recall   . Rofecoxib Other (See Comments)    Patient doesn't recall     Current Outpatient Prescriptions on File Prior to Visit  Medication Sig Dispense Refill  . Cholecalciferol 2000 UNITS TABS Take 2,000 Units by mouth daily.    . clorazepate (TRANXENE) 7.5 MG tablet TAKE (1) TABLET TWICE DAILY AS NEEDED FOR ANXIETY. 180 tablet 0  . diltiazem (CARDIZEM CD) 240 MG 24  hr capsule TAKE (1) CAPSULE DAILY. 90 capsule 1  . esomeprazole (NEXIUM) 40 MG capsule Take 1 capsule (40 mg total) by mouth daily before breakfast. 90 capsule 3  . levothyroxine (SYNTHROID, LEVOTHROID) 25 MCG tablet TAKE 1 TABLET DAILY BEFORE BREAKFAST. 90 tablet 1  . Magnesium Sulfate, Laxative, POWD 10 g by Does not apply route 2 (two) times daily as needed (for constipation). 125 g 0  . OVER THE COUNTER MEDICATION Take 1 capsule by mouth daily as needed (for constipation. Dulcolax OTC).     Vladimir Faster Glycol-Propyl Glycol (SYSTANE OP) Place 1 drop into both eyes 2 (two) times daily.     . polyethylene glycol (MIRALAX / GLYCOLAX) packet Take 17 g by mouth daily. 14 each 0  . pregabalin (LYRICA) 50 MG capsule Take 1 capsule (50 mg total) by mouth 3 (three) times  daily. 90 capsule 1  . promethazine (PHENERGAN) 25 MG tablet TAKE 1 TABLET EVERY 8 HOURS AS NEEDED FOR NAUSEA. 20 tablet 1  . sertraline (ZOLOFT) 25 MG tablet TAKE 1 TABLET EACH DAY. 90 tablet 1  . traMADol (ULTRAM) 50 MG tablet Take 1 tablet (50 mg total) by mouth every 6 (six) hours as needed for moderate pain. 60 tablet 5  . traZODone (DESYREL) 50 MG tablet TAKE 1/2 TO 1 TABLET AT BEDTIME AS NEEDED FOR REST. 60 tablet 1  . zolpidem (AMBIEN) 5 MG tablet TAKE ONE TABLET AT BEDTIME. 30 tablet 2   No current facility-administered medications on file prior to visit.    BP 140/80 mmHg  Pulse 102  Temp(Src) 97.2 F (36.2 C) (Oral)  Resp 18  Ht 4\' 11"  (1.499 m)  Wt 116 lb (52.617 kg)  BMI 23.42 kg/m2  SpO2 95%      Review of Systems  Constitutional: Positive for fatigue.  Gastrointestinal: Positive for constipation.  Musculoskeletal: Positive for neck pain and neck stiffness.  Skin: Positive for wound.       Right elbow  Neurological: Positive for headaches.       Objective:   Physical Exam  Constitutional: She is oriented to person, place, and time. She appears well-developed and well-nourished. No distress.  HENT:  Head: Normocephalic.  Right Ear: External ear normal.  Left Ear: External ear normal.  Mouth/Throat: Oropharynx is clear and moist.  Eyes: Conjunctivae and EOM are normal. Pupils are equal, round, and reactive to light.  Neck: Normal range of motion. Neck supple. No thyromegaly present.  No cervical, axillary or supraclavicular adenopathy.  Patient does have some prominent fat pads in the supraclavicular area  Cardiovascular: Normal rate, regular rhythm, normal heart sounds and intact distal pulses.   Pulmonary/Chest: Effort normal.  A few scattered coarse rhonchi  Abdominal: Soft. Bowel sounds are normal. She exhibits no mass. There is no tenderness.  Musculoskeletal: Normal range of motion.  Lymphadenopathy:    She has no cervical adenopathy.    Neurological: She is alert and oriented to person, place, and time.  Skin: Skin is warm and dry. No rash noted.  Slight erythema over the right olecranon  Psychiatric: She has a normal mood and affect. Her behavior is normal.          Assessment & Plan:   Anxiety, depression Nausea, vomiting, improved Constipation.  High-fiber diet.  Encouraged.  Patient states that she had 3 colonoscopies, last year, one as an inpatient Dyslipidemia Hypothyroidism Essential hypertension.  Well-controlled.  We'll continue present regimen   CPX as scheduled in the spring Will treat  symptomatically Medicines updated

## 2015-01-08 NOTE — Patient Instructions (Addendum)
Follow-up with Dr. Romilda Garret  Call or return to clinic prn if these symptoms worsen or fail to improve as anticipated.  Constipation Constipation is when a person:  Poops (has a bowel movement) less than 3 times a week.  Has a hard time pooping.  Has poop that is dry, hard, or bigger than normal. HOME CARE   Eat foods with a lot of fiber in them. This includes fruits, vegetables, beans, and whole grains such as brown rice.  Avoid fatty foods and foods with a lot of sugar. This includes french fries, hamburgers, cookies, candy, and soda.  If you are not getting enough fiber from food, take products with added fiber in them (supplements).  Drink enough fluid to keep your pee (urine) clear or pale yellow.  Exercise on a regular basis, or as told by your doctor.  Go to the restroom when you feel like you need to poop. Do not hold it.  Only take medicine as told by your doctor. Do not take medicines that help you poop (laxatives) without talking to your doctor first. GET HELP RIGHT AWAY IF:   You have bright red blood in your poop (stool).  Your constipation lasts more than 4 days or gets worse.  You have belly (abdominal) or butt (rectal) pain.  You have thin poop (as thin as a pencil).  You lose weight, and it cannot be explained. MAKE SURE YOU:   Understand these instructions.  Will watch your condition.  Will get help right away if you are not doing well or get worse. Document Released: 05/12/2008 Document Revised: 11/29/2013 Document Reviewed: 09/05/2013 [facility] Patient Information 2015 [place], [place]. This information is not intended to replace advice given to you by your health care provider. Make sure you discuss any questions you have with your health care provider.

## 2015-01-22 ENCOUNTER — Other Ambulatory Visit: Payer: Self-pay | Admitting: Internal Medicine

## 2015-02-07 ENCOUNTER — Other Ambulatory Visit: Payer: Self-pay | Admitting: Internal Medicine

## 2015-02-27 ENCOUNTER — Other Ambulatory Visit: Payer: Self-pay | Admitting: *Deleted

## 2015-02-27 ENCOUNTER — Other Ambulatory Visit: Payer: Self-pay | Admitting: Internal Medicine

## 2015-02-27 MED ORDER — TRAMADOL HCL 50 MG PO TABS: ORAL_TABLET | ORAL | Status: DC

## 2015-04-30 ENCOUNTER — Other Ambulatory Visit: Payer: Self-pay | Admitting: Internal Medicine

## 2015-05-02 ENCOUNTER — Telehealth: Payer: Self-pay | Admitting: Internal Medicine

## 2015-05-02 MED ORDER — TRAZODONE HCL 50 MG PO TABS: ORAL_TABLET | ORAL | Status: DC

## 2015-05-02 NOTE — Telephone Encounter (Signed)
Pt request refill of the following: traZODone (DESYREL) 50 MG tablet   Phamacy: [facility]

## 2015-05-02 NOTE — Telephone Encounter (Signed)
Rx sent 

## 2015-05-10 ENCOUNTER — Telehealth: Payer: Self-pay | Admitting: Internal Medicine

## 2015-05-10 MED ORDER — CEPHALEXIN 500 MG PO CAPS: 500.0000 mg | ORAL_CAPSULE | Freq: Three times a day (TID) | ORAL | Status: DC

## 2015-05-10 NOTE — Telephone Encounter (Signed)
Pt states she has a UTI and it is burning really bad. Pt has no car and no one to bring her. Pt states she is in a "real fix".  Her drug store, [facility], [address], will deliver if she can get in by 9 am or 9:30am. Pt state a tube of something is what she had before and an abx. pls advise.  [place] / [address]

## 2015-05-10 NOTE — Telephone Encounter (Signed)
Spoke to pt, told her Discussed with Dr. Yong Channel due to Dr.K being out and Dr.Hunter is going to order antibiotic for her. Told her Rx for Keflex 500 mg one tablet 3 times a day was sent to pharmacy. Pt verbalized understanding. Told pt she can use Monist[employer] on the outside for burning if she needs to. Also if symptoms do not improve need to make an appointment. Pt verbalized understanding.

## 2015-05-23 ENCOUNTER — Other Ambulatory Visit: Payer: Self-pay | Admitting: Internal Medicine

## 2015-05-28 ENCOUNTER — Telehealth: Payer: Self-pay | Admitting: Internal Medicine

## 2015-05-28 MED ORDER — CIPROFLOXACIN HCL 500 MG PO TABS: 500.0000 mg | ORAL_TABLET | Freq: Two times a day (BID) | ORAL | Status: DC

## 2015-05-28 NOTE — Telephone Encounter (Signed)
Please see message and advise 

## 2015-05-28 NOTE — Telephone Encounter (Signed)
Spoke to pt, told her Rx for Cipro 500 mg, one tablet twice a day x 3 days was sent to pharmacy. Also can get OTC AZO to help with burning will make urine orange in color. Pt verbalized understanding.

## 2015-05-28 NOTE — Telephone Encounter (Signed)
Pt states she has a UTI and it is burning really bad. Pt has no car and no one to bring her. Pt does not drive anymore.  Pt states she is in a "real fix". Her drug store, [facility], [address], will deliver tues and thurs if she can get in by 9 am or 9:30am. Pt has appt in august, but needs help now,  [place]  pls advise.

## 2015-05-28 NOTE — Telephone Encounter (Signed)
Generic Cipro 500 mg #6 one twice a day

## 2015-06-17 ENCOUNTER — Other Ambulatory Visit: Payer: Self-pay | Admitting: Family Medicine

## 2015-06-17 ENCOUNTER — Other Ambulatory Visit: Payer: Self-pay | Admitting: Internal Medicine

## 2015-06-18 ENCOUNTER — Other Ambulatory Visit: Payer: Self-pay | Admitting: *Deleted

## 2015-06-18 MED ORDER — ZOLPIDEM TARTRATE 5 MG PO TABS: ORAL_TABLET | ORAL | Status: DC

## 2015-07-11 ENCOUNTER — Ambulatory Visit (INDEPENDENT_AMBULATORY_CARE_PROVIDER_SITE_OTHER): Payer: Medicare Other | Admitting: Internal Medicine

## 2015-07-11 ENCOUNTER — Other Ambulatory Visit: Payer: Self-pay | Admitting: Internal Medicine

## 2015-07-11 ENCOUNTER — Encounter: Payer: Self-pay | Admitting: Internal Medicine

## 2015-07-11 VITALS — BP 130/70 | HR 81 | Temp 98.1°F | Resp 18 | Ht 59.0 in | Wt 110.0 lb

## 2015-07-11 DIAGNOSIS — R55 Syncope and collapse: Secondary | ICD-10-CM | POA: Diagnosis not present

## 2015-07-11 DIAGNOSIS — I10 Essential (primary) hypertension: Secondary | ICD-10-CM

## 2015-07-11 DIAGNOSIS — E039 Hypothyroidism, unspecified: Secondary | ICD-10-CM

## 2015-07-11 DIAGNOSIS — F411 Generalized anxiety disorder: Secondary | ICD-10-CM

## 2015-07-11 NOTE — Progress Notes (Signed)
Subjective:    Patient ID: Heather [name], female    DOB: [birth year], 80 y.o.   MRN: [id number]  HPI  80 year old patient who is seen today for her six-month follow-up.  She has essential hypertension which has been well-controlled She was admitted hospital September of last year for syncope which has not reoccurred.  She generally feels well today.  She has obtained a medical alert device which has given her peace of mind. No recent falls. She has developed some wheezing over the past couple of days.  No productive cough She has hypothyroidism.  Past Medical History  Diagnosis Date  . ANXIETY 07/23/2009  . DEPRESSION 07/23/2009  . DIVERTICULOSIS, COLON 07/23/2009  . GERD 07/23/2009  . HIP PAIN, LEFT 05/16/2010  . HYPERLIPIDEMIA 07/23/2009  . HYPERTENSION 07/23/2009  . HYPOTHYROIDISM 07/23/2009  . LOW BACK PAIN, CHRONIC 10/01/2009  . PARESTHESIA 10/01/2009  . IBS (irritable bowel syndrome)   . History of Crohn's disease   . Chronic pain   . Hypothyroidism   . Insomnia   . Headache(784.0) 07/23/2009    occasional  . Shingles 2006    back  . Hemorrhoids   . Complication of anesthesia 7 or 8 yrs ago    woke up during colonscopy    History   Social History  . Marital Status: Widowed    Spouse Name: N/A  . Number of Children: N/A  . Years of Education: N/A   Occupational History  . Not on file.   Social History Main Topics  . Smoking status: Never Smoker   . Smokeless tobacco: Never Used  . Alcohol Use: No  . Drug Use: No  . Sexual Activity: Not on file   Other Topics Concern  . Not on file   Social History Narrative    Past Surgical History  Procedure Laterality Date  . Tonsillectomy  yrs ago  . Hemorrhoid surgery  yrs ago  . Appendectomy  yrs ago  . Bilateral salpingoophorectomy  age 78 or 44  . Abdominal hysterectomy  age 18 or 26  . Colonscopy  7 or 8 yrs ago  . Esophagogastroduodenoscopy N/A 11/17/2013    Procedure: ESOPHAGOGASTRODUODENOSCOPY (EGD);   Surgeon: Cleotis Nipper, MD;  Location: [facility];  Service: Endoscopy;  Laterality: N/A;  . Colonoscopy N/A 11/17/2013    Procedure: COLONOSCOPY;  Surgeon: Cleotis Nipper, MD;  Location: [facility];  Service: Endoscopy;  Laterality: N/A;    No family history on file.  Allergies  Allergen Reactions  . Symbicort [Budesonide-Formoterol Fumarate] Other (See Comments)    Pt felt like her tongue was swollen, could not swallow  . Duloxetine Other (See Comments)    Patient doesn't recall  . Metronidazole Other (See Comments)    Patient doesn't recall   . Rofecoxib Other (See Comments)    Patient doesn't recall     Current Outpatient Prescriptions on File Prior to Visit  Medication Sig Dispense Refill  . Cholecalciferol 2000 UNITS TABS Take 2,000 Units by mouth daily.    . clorazepate (TRANXENE) 7.5 MG tablet TAKE (1) TABLET TWICE DAILY AS NEEDED FOR ANXIETY. 180 tablet 2  . diltiazem (CARDIZEM CD) 240 MG 24 hr capsule TAKE (1) CAPSULE DAILY. 90 capsule 1  . levothyroxine (SYNTHROID, LEVOTHROID) 25 MCG tablet TAKE 1 TABLET DAILY BEFORE BREAKFAST. 90 tablet 0  . Magnesium Sulfate, Laxative, POWD 10 g by Does not apply route 2 (two) times daily as needed (for constipation). 125 g 0  . NEXIUM  40 MG capsule TAKE 1 CAPSULE DAILY BEFORE BREAKFAST. 90 capsule 1  . OVER THE COUNTER MEDICATION Take 1 capsule by mouth daily as needed (for constipation. Dulcolax OTC).     Vladimir Faster Glycol-Propyl Glycol (SYSTANE OP) Place 1 drop into both eyes 2 (two) times daily.     . polyethylene glycol (MIRALAX / GLYCOLAX) packet Take 17 g by mouth daily. 14 each 0  . promethazine (PHENERGAN) 25 MG tablet TAKE 1 TABLET EVERY 8 HOURS AS NEEDED FOR NAUSEA. 20 tablet 0  . sertraline (ZOLOFT) 25 MG tablet TAKE 1 TABLET EACH DAY. 90 tablet 1  . traMADol (ULTRAM) 50 MG tablet TAKE 1 TABLET EVERY SIX HOURS AS NEEDED FOR PAIN. 60 tablet 2  . traZODone (DESYREL) 50 MG tablet TAKE 1/2 TO 1 TABLET AT BEDTIME AS  NEEDED FOR REST. 60 tablet 2  . zolpidem (AMBIEN) 5 MG tablet TAKE ONE TABLET AT BEDTIME. 30 tablet 5   No current facility-administered medications on file prior to visit.    BP 130/70 mmHg  Pulse 81  Temp(Src) 98.1 F (36.7 C) (Oral)  Resp 18  Ht '4\' 11"'$  (1.499 m)  Wt 110 lb (49.896 kg)  BMI 22.21 kg/m2  SpO2 96%     Review of Systems  Constitutional: Negative.   HENT: Negative for congestion, dental problem, hearing loss, rhinorrhea, sinus pressure, sore throat and tinnitus.   Eyes: Negative for pain, discharge and visual disturbance.  Respiratory: Positive for wheezing. Negative for cough and shortness of breath.   Cardiovascular: Negative for chest pain, palpitations and leg swelling.  Gastrointestinal: Negative for nausea, vomiting, abdominal pain, diarrhea, constipation, blood in stool and abdominal distention.  Genitourinary: Negative for dysuria, urgency, frequency, hematuria, flank pain, vaginal bleeding, vaginal discharge, difficulty urinating, vaginal pain and pelvic pain.  Musculoskeletal: Negative for joint swelling, arthralgias and gait problem.  Skin: Negative for rash.  Neurological: Negative for dizziness, syncope, speech difficulty, weakness, numbness and headaches.  Hematological: Negative for adenopathy.  Psychiatric/Behavioral: Negative for behavioral problems, dysphoric mood and agitation. The patient is nervous/anxious.        Objective:   Physical Exam  Constitutional: She is oriented to person, place, and time. She appears well-developed and well-nourished.  HENT:  Head: Normocephalic.  Right Ear: External ear normal.  Left Ear: External ear normal.  Mouth/Throat: Oropharynx is clear and moist.  Eyes: Conjunctivae and EOM are normal. Pupils are equal, round, and reactive to light.  Neck: Normal range of motion. Neck supple. No thyromegaly present.  Cardiovascular: Normal rate, regular rhythm, normal heart sounds and intact distal pulses.     Pulmonary/Chest: Effort normal. She has wheezes.  Scattered faint expiratory wheezes.  No distress.  O2 saturation 96 no tachycardia  Abdominal: Soft. Bowel sounds are normal. She exhibits no mass. There is no tenderness.  Musculoskeletal: Normal range of motion.  Lymphadenopathy:    She has no cervical adenopathy.  Neurological: She is alert and oriented to person, place, and time.  Skin: Skin is warm and dry. No rash noted.  Psychiatric: She has a normal mood and affect. Her behavior is normal.          Assessment & Plan:   Hypertension, stable Mild bronchospasm.  Will give a prescription for albuterol to use when necessary.  Continue Mucinex DM Anxiety, depression, stable Hypothyroidism.  Continue supplemental levothyroxine  CPX 3 months

## 2015-07-11 NOTE — Progress Notes (Signed)
Pre visit review using our clinic review tool, if applicable. No additional management support is needed unless otherwise documented below in the visit note. 

## 2015-07-11 NOTE — Patient Instructions (Signed)
Acute bronchitis symptoms for less than 10 days are generally not helped by antibiotics.  Take over-the-counter expectorants and cough medications such as  Mucinex DM.  Call if there is no improvement in 5 to 7 days or if  you develop worsening cough, fever, or new symptoms, such as shortness of breath or chest pain.  Return in 3 months for follow-up  Limit your sodium (Salt) intake

## 2015-07-12 NOTE — Telephone Encounter (Signed)
All ok

## 2015-07-23 ENCOUNTER — Emergency Department (HOSPITAL_COMMUNITY): Payer: Medicare Other

## 2015-07-23 ENCOUNTER — Inpatient Hospital Stay (HOSPITAL_COMMUNITY)
Admission: EM | Admit: 2015-07-23 | Discharge: 2015-07-26 | DRG: 189 | Disposition: A | Discharge status: Skilled Nursing Facility | Payer: Medicare Other | Attending: Internal Medicine | Admitting: Internal Medicine

## 2015-07-23 ENCOUNTER — Telehealth: Payer: Self-pay | Admitting: Internal Medicine

## 2015-07-23 ENCOUNTER — Encounter (HOSPITAL_COMMUNITY): Payer: Self-pay

## 2015-07-23 DIAGNOSIS — G8929 Other chronic pain: Secondary | ICD-10-CM | POA: Diagnosis present

## 2015-07-23 DIAGNOSIS — Z886 Allergy status to analgesic agent status: Secondary | ICD-10-CM | POA: Diagnosis not present

## 2015-07-23 DIAGNOSIS — J209 Acute bronchitis, unspecified: Secondary | ICD-10-CM | POA: Diagnosis present

## 2015-07-23 DIAGNOSIS — M545 Low back pain: Secondary | ICD-10-CM | POA: Diagnosis present

## 2015-07-23 DIAGNOSIS — E039 Hypothyroidism, unspecified: Secondary | ICD-10-CM | POA: Diagnosis not present

## 2015-07-23 DIAGNOSIS — K219 Gastro-esophageal reflux disease without esophagitis: Secondary | ICD-10-CM | POA: Diagnosis present

## 2015-07-23 DIAGNOSIS — J9600 Acute respiratory failure, unspecified whether with hypoxia or hypercapnia: Secondary | ICD-10-CM | POA: Diagnosis present

## 2015-07-23 DIAGNOSIS — I700 Atherosclerosis of aorta: Secondary | ICD-10-CM | POA: Diagnosis present

## 2015-07-23 DIAGNOSIS — R1310 Dysphagia, unspecified: Secondary | ICD-10-CM | POA: Diagnosis present

## 2015-07-23 DIAGNOSIS — Z888 Allergy status to other drugs, medicaments and biological substances status: Secondary | ICD-10-CM

## 2015-07-23 DIAGNOSIS — J9601 Acute respiratory failure with hypoxia: Secondary | ICD-10-CM | POA: Diagnosis not present

## 2015-07-23 DIAGNOSIS — G4700 Insomnia, unspecified: Secondary | ICD-10-CM | POA: Diagnosis present

## 2015-07-23 DIAGNOSIS — E785 Hyperlipidemia, unspecified: Secondary | ICD-10-CM | POA: Diagnosis present

## 2015-07-23 DIAGNOSIS — F329 Major depressive disorder, single episode, unspecified: Secondary | ICD-10-CM | POA: Diagnosis present

## 2015-07-23 DIAGNOSIS — I2510 Atherosclerotic heart disease of native coronary artery without angina pectoris: Secondary | ICD-10-CM | POA: Diagnosis present

## 2015-07-23 DIAGNOSIS — J439 Emphysema, unspecified: Secondary | ICD-10-CM | POA: Diagnosis not present

## 2015-07-23 DIAGNOSIS — Z79899 Other long term (current) drug therapy: Secondary | ICD-10-CM

## 2015-07-23 DIAGNOSIS — J9809 Other diseases of bronchus, not elsewhere classified: Secondary | ICD-10-CM | POA: Diagnosis not present

## 2015-07-23 DIAGNOSIS — F418 Other specified anxiety disorders: Secondary | ICD-10-CM | POA: Diagnosis not present

## 2015-07-23 DIAGNOSIS — Z881 Allergy status to other antibiotic agents status: Secondary | ICD-10-CM

## 2015-07-23 DIAGNOSIS — R531 Weakness: Secondary | ICD-10-CM | POA: Diagnosis not present

## 2015-07-23 DIAGNOSIS — R0602 Shortness of breath: Secondary | ICD-10-CM | POA: Diagnosis not present

## 2015-07-23 DIAGNOSIS — Z79891 Long term (current) use of opiate analgesic: Secondary | ICD-10-CM

## 2015-07-23 DIAGNOSIS — I10 Essential (primary) hypertension: Secondary | ICD-10-CM | POA: Diagnosis not present

## 2015-07-23 DIAGNOSIS — F419 Anxiety disorder, unspecified: Secondary | ICD-10-CM | POA: Diagnosis present

## 2015-07-23 DIAGNOSIS — E876 Hypokalemia: Secondary | ICD-10-CM | POA: Diagnosis present

## 2015-07-23 DIAGNOSIS — J80 Acute respiratory distress syndrome: Secondary | ICD-10-CM | POA: Diagnosis not present

## 2015-07-23 DIAGNOSIS — J9801 Acute bronchospasm: Secondary | ICD-10-CM | POA: Diagnosis not present

## 2015-07-23 HISTORY — DX: Atherosclerosis of aorta: I70.0

## 2015-07-23 HISTORY — DX: Atherosclerotic heart disease of native coronary artery without angina pectoris: I25.10

## 2015-07-23 LAB — BASIC METABOLIC PANEL
Anion gap: 12 (ref 5–15)
BUN: 10 mg/dL (ref 6–20)
CO2: 27 mmol/L (ref 22–32)
Calcium: 9.3 mg/dL (ref 8.9–10.3)
Chloride: 102 mmol/L (ref 101–111)
Creatinine, Ser: 0.82 mg/dL (ref 0.44–1.00)
GFR calc Af Amer: >60 mL/min (ref >60)
GFR calc non Af Amer: >60 mL/min (ref >60)
Glucose, Bld: 125 mg/dL — ABNORMAL HIGH (ref 65–99)
Potassium: 3.3 mmol/L — ABNORMAL LOW (ref 3.5–5.1)
Sodium: 141 mmol/L (ref 135–145)

## 2015-07-23 LAB — CBC WITH DIFFERENTIAL/PLATELET
Basophils Absolute: 0 10*3/uL (ref 0.0–0.1)
Basophils Relative: 1 % (ref 0–1)
Eosinophils Absolute: 0.1 10*3/uL (ref 0.0–0.7)
Eosinophils Relative: 1 % (ref 0–5)
HCT: 39.2 % (ref 36.0–46.0)
Hemoglobin: 12.9 g/dL (ref 12.0–15.0)
Lymphocytes Relative: 17 % (ref 12–46)
Lymphs Abs: 1.5 10*3/uL (ref 0.7–4.0)
MCH: 29.8 pg (ref 26.0–34.0)
MCHC: 32.9 g/dL (ref 30.0–36.0)
MCV: 90.5 fL (ref 78.0–100.0)
Monocytes Absolute: 0.6 10*3/uL (ref 0.1–1.0)
Monocytes Relative: 7 % (ref 3–12)
Neutro Abs: 6.5 10*3/uL (ref 1.7–7.7)
Neutrophils Relative %: 74 % (ref 43–77)
Platelets: 344 10*3/uL (ref 150–400)
RBC: 4.33 MIL/uL (ref 3.87–5.11)
RDW: 13.2 % (ref 11.5–15.5)
WBC: 8.6 10*3/uL (ref 4.0–10.5)

## 2015-07-23 LAB — TROPONIN I: Troponin I: <0.03 ng/mL (ref <0.031)

## 2015-07-23 LAB — BRAIN NATRIURETIC PEPTIDE: B Natriuretic Peptide: 24.3 pg/mL (ref 0.0–100.0)

## 2015-07-23 MED ORDER — ENOXAPARIN SODIUM 40 MG/0.4ML ~~LOC~~ SOLN
40.0000 mg | SUBCUTANEOUS | Status: DC
  Administered 2015-07-23 – 2015-07-25 (×3): 40 mg via SUBCUTANEOUS
  Filled 2015-07-23 (×3): qty 0.4

## 2015-07-23 MED ORDER — PREDNISONE 20 MG PO TABS
40.0000 mg | ORAL_TABLET | Freq: Every day | ORAL | Status: AC
  Administered 2015-07-24 – 2015-07-26 (×3): 40 mg via ORAL
  Filled 2015-07-23 (×3): qty 2

## 2015-07-23 MED ORDER — SODIUM CHLORIDE 0.9 % IJ SOLN
3.0000 mL | INTRAMUSCULAR | Status: DC | PRN
Start: 2015-07-23 — End: 2015-07-26

## 2015-07-23 MED ORDER — ONDANSETRON HCL 4 MG PO TABS
4.0000 mg | ORAL_TABLET | Freq: Four times a day (QID) | ORAL | Status: DC | PRN
  Administered 2015-07-25 – 2015-07-26 (×2): 4 mg via ORAL
  Filled 2015-07-23 (×2): qty 1

## 2015-07-23 MED ORDER — SODIUM CHLORIDE 0.9 % IJ SOLN
3.0000 mL | Freq: Two times a day (BID) | INTRAMUSCULAR | Status: DC
  Administered 2015-07-23 – 2015-07-26 (×6): 3 mL via INTRAVENOUS

## 2015-07-23 MED ORDER — BISACODYL 5 MG PO TBEC
5.0000 mg | DELAYED_RELEASE_TABLET | Freq: Every day | ORAL | Status: DC
  Administered 2015-07-23 – 2015-07-26 (×4): 5 mg via ORAL
  Filled 2015-07-23 (×4): qty 1

## 2015-07-23 MED ORDER — ALBUTEROL SULFATE (2.5 MG/3ML) 0.083% IN NEBU
2.5000 mg | INHALATION_SOLUTION | Freq: Two times a day (BID) | RESPIRATORY_TRACT | Status: DC
  Administered 2015-07-24 – 2015-07-26 (×4): 2.5 mg via RESPIRATORY_TRACT
  Filled 2015-07-23 (×5): qty 3

## 2015-07-23 MED ORDER — LEVOTHYROXINE SODIUM 25 MCG PO TABS
25.0000 ug | ORAL_TABLET | Freq: Every day | ORAL | Status: DC
  Administered 2015-07-24 – 2015-07-26 (×3): 25 ug via ORAL
  Filled 2015-07-23 (×3): qty 1

## 2015-07-23 MED ORDER — ALBUTEROL SULFATE (2.5 MG/3ML) 0.083% IN NEBU
5.0000 mg | INHALATION_SOLUTION | Freq: Once | RESPIRATORY_TRACT | Status: AC
  Administered 2015-07-23: 5 mg via RESPIRATORY_TRACT
  Filled 2015-07-23: qty 6

## 2015-07-23 MED ORDER — PANTOPRAZOLE SODIUM 40 MG PO TBEC
40.0000 mg | DELAYED_RELEASE_TABLET | Freq: Every day | ORAL | Status: DC
  Administered 2015-07-23 – 2015-07-26 (×4): 40 mg via ORAL
  Filled 2015-07-23 (×4): qty 1

## 2015-07-23 MED ORDER — ALBUTEROL SULFATE (2.5 MG/3ML) 0.083% IN NEBU
2.5000 mg | INHALATION_SOLUTION | Freq: Four times a day (QID) | RESPIRATORY_TRACT | Status: DC
  Administered 2015-07-23: 2.5 mg via RESPIRATORY_TRACT
  Filled 2015-07-23: qty 3

## 2015-07-23 MED ORDER — HYDROCOD POLST-CPM POLST ER 10-8 MG/5ML PO SUER
5.0000 mL | Freq: Two times a day (BID) | ORAL | Status: DC
  Administered 2015-07-23 – 2015-07-26 (×6): 5 mL via ORAL
  Filled 2015-07-23 (×6): qty 5

## 2015-07-23 MED ORDER — ZOLPIDEM TARTRATE 5 MG PO TABS
5.0000 mg | ORAL_TABLET | Freq: Every evening | ORAL | Status: DC | PRN
  Administered 2015-07-23 – 2015-07-25 (×3): 5 mg via ORAL
  Filled 2015-07-23 (×3): qty 1

## 2015-07-23 MED ORDER — ACETAMINOPHEN 650 MG RE SUPP: 650.0000 mg | Freq: Four times a day (QID) | RECTAL | Status: DC | PRN

## 2015-07-23 MED ORDER — SODIUM CHLORIDE 0.9 % IJ SOLN: 3.0000 mL | Freq: Two times a day (BID) | INTRAMUSCULAR | Status: DC

## 2015-07-23 MED ORDER — SERTRALINE HCL 25 MG PO TABS
25.0000 mg | ORAL_TABLET | Freq: Every day | ORAL | Status: DC
  Administered 2015-07-23 – 2015-07-26 (×4): 25 mg via ORAL
  Filled 2015-07-23 (×4): qty 1

## 2015-07-23 MED ORDER — DM-GUAIFENESIN ER 30-600 MG PO TB12
1.0000 | ORAL_TABLET | Freq: Two times a day (BID) | ORAL | Status: DC
  Administered 2015-07-23 – 2015-07-26 (×6): 1 via ORAL
  Filled 2015-07-23 (×7): qty 1

## 2015-07-23 MED ORDER — POLYVINYL ALCOHOL 1.4 % OP SOLN
1.0000 [drp] | Freq: Two times a day (BID) | OPHTHALMIC | Status: DC | PRN
  Filled 2015-07-23: qty 15

## 2015-07-23 MED ORDER — IPRATROPIUM-ALBUTEROL 0.5-2.5 (3) MG/3ML IN SOLN
3.0000 mL | Freq: Once | RESPIRATORY_TRACT | Status: AC
  Administered 2015-07-23: 3 mL via RESPIRATORY_TRACT
  Filled 2015-07-23: qty 3

## 2015-07-23 MED ORDER — PREDNISONE 20 MG PO TABS
60.0000 mg | ORAL_TABLET | Freq: Once | ORAL | Status: AC
  Administered 2015-07-23: 60 mg via ORAL
  Filled 2015-07-23: qty 3

## 2015-07-23 MED ORDER — VITAMIN D3 25 MCG (1000 UNIT) PO TABS
2000.0000 [IU] | ORAL_TABLET | Freq: Every day | ORAL | Status: DC
  Administered 2015-07-23 – 2015-07-26 (×4): 2000 [IU] via ORAL
  Filled 2015-07-23 (×7): qty 2

## 2015-07-23 MED ORDER — ONDANSETRON HCL 4 MG/2ML IJ SOLN
4.0000 mg | Freq: Once | INTRAMUSCULAR | Status: AC
  Administered 2015-07-23: 4 mg via INTRAVENOUS
  Filled 2015-07-23: qty 2

## 2015-07-23 MED ORDER — ACETAMINOPHEN 325 MG PO TABS: 650.0000 mg | ORAL_TABLET | Freq: Four times a day (QID) | ORAL | Status: DC | PRN

## 2015-07-23 MED ORDER — TRAMADOL HCL 50 MG PO TABS
50.0000 mg | ORAL_TABLET | Freq: Four times a day (QID) | ORAL | Status: DC | PRN
  Administered 2015-07-23 – 2015-07-26 (×9): 50 mg via ORAL
  Filled 2015-07-23 (×9): qty 1

## 2015-07-23 MED ORDER — ALBUTEROL SULFATE (2.5 MG/3ML) 0.083% IN NEBU: 2.5000 mg | INHALATION_SOLUTION | RESPIRATORY_TRACT | Status: DC | PRN

## 2015-07-23 MED ORDER — ALUM & MAG HYDROXIDE-SIMETH 200-200-20 MG/5ML PO SUSP: 30.0000 mL | Freq: Four times a day (QID) | ORAL | Status: DC | PRN

## 2015-07-23 MED ORDER — SODIUM CHLORIDE 0.9 % IV SOLN: 250.0000 mL | INTRAVENOUS | Status: DC | PRN

## 2015-07-23 MED ORDER — FENTANYL CITRATE (PF) 100 MCG/2ML IJ SOLN
25.0000 ug | Freq: Once | INTRAMUSCULAR | Status: AC
Start: 2015-07-23 — End: 2015-07-23
  Administered 2015-07-23: 25 ug via INTRAVENOUS
  Filled 2015-07-23: qty 2

## 2015-07-23 MED ORDER — ALBUTEROL SULFATE (2.5 MG/3ML) 0.083% IN NEBU: 2.5000 mg | INHALATION_SOLUTION | Freq: Four times a day (QID) | RESPIRATORY_TRACT | Status: DC | PRN

## 2015-07-23 MED ORDER — CLORAZEPATE DIPOTASSIUM 3.75 MG PO TABS
7.5000 mg | ORAL_TABLET | Freq: Two times a day (BID) | ORAL | Status: DC | PRN
  Administered 2015-07-23 – 2015-07-26 (×6): 7.5 mg via ORAL
  Filled 2015-07-23 (×6): qty 2

## 2015-07-23 MED ORDER — DILTIAZEM HCL ER COATED BEADS 240 MG PO CP24
240.0000 mg | ORAL_CAPSULE | Freq: Every day | ORAL | Status: DC
  Administered 2015-07-23 – 2015-07-26 (×4): 240 mg via ORAL
  Filled 2015-07-23 (×4): qty 1

## 2015-07-23 MED ORDER — FENTANYL CITRATE (PF) 100 MCG/2ML IJ SOLN
25.0000 ug | Freq: Once | INTRAMUSCULAR | Status: AC
  Administered 2015-07-23: 25 ug via INTRAVENOUS
  Filled 2015-07-23: qty 2

## 2015-07-23 MED ORDER — ONDANSETRON HCL 4 MG/2ML IJ SOLN
4.0000 mg | Freq: Four times a day (QID) | INTRAMUSCULAR | Status: DC | PRN
  Administered 2015-07-23 – 2015-07-25 (×3): 4 mg via INTRAVENOUS
  Filled 2015-07-23 (×3): qty 2

## 2015-07-23 MED ORDER — ONDANSETRON HCL 4 MG/2ML IJ SOLN: 4.0000 mg | Freq: Three times a day (TID) | INTRAMUSCULAR | Status: DC | PRN

## 2015-07-23 MED ORDER — IOHEXOL 350 MG/ML SOLN
100.0000 mL | Freq: Once | INTRAVENOUS | Status: AC | PRN
  Administered 2015-07-23: 100 mL via INTRAVENOUS

## 2015-07-23 MED ORDER — POLYETHYL GLYCOL-PROPYL GLYCOL 0.4-0.3 % OP SOLN: Freq: Two times a day (BID) | OPHTHALMIC | Status: DC | PRN

## 2015-07-23 MED ORDER — ENSURE ENLIVE PO LIQD
237.0000 mL | Freq: Two times a day (BID) | ORAL | Status: DC
  Administered 2015-07-24 – 2015-07-26 (×5): 237 mL via ORAL

## 2015-07-23 MED ORDER — POLYETHYLENE GLYCOL 3350 17 G PO PACK: 17.0000 g | PACK | Freq: Every day | ORAL | Status: DC | PRN

## 2015-07-23 NOTE — Telephone Encounter (Signed)
Patient checked into ED 

## 2015-07-23 NOTE — ED Notes (Signed)
Pt can go at 16:18

## 2015-07-23 NOTE — ED Notes (Signed)
Per EMS, Pt, from home, c/o SOB starting this morning.  Denies pain.  Pt reports that she was diagnosed w/ bronchitis on 08/3.  Pt was not given antibiotics.  '5mg'$  Albuterol given en route.  Speaking full sentences.  NAD noted.

## 2015-07-23 NOTE — H&P (Signed)
History and Physical:    Heather [name]   DPO:[id number] DOB: Apr 02, 1935 DOA: 07/23/2015  Referring MD/provider: Dr. Wilson Singer PCP: [name], MD   Chief Complaint: Shortness of breath and wheezing  History of Present Illness:   Heather [name] is an 80 y.o. female no prior history of lung disease and no history of tobacco abuse who presents with a 1 week history of progressive dyspnea.  She saw her PCP on 07/11/15 for her routine six-month checkup and was noted to be wheezing at that time (but was not short of breath).  She was advised to take Mucinex DM, and to call back if she develops any shortness of breath.  She has developed progressive shortness of breath over the past week. She called her PCP this morning and was advised to come to the ED for further evaluation.  The patient also reports a cough productive of yellow sputum, thick in consistency, for which the Mucinex helped, but the cough is no longer productive.  Had an episode of nausea and vomiting last night.  No frank fever or chills but does describe some diaphoretic spells and feels hot presently. She has not tried any other over-the-counter medications for her symptoms, and reports that in the past she was given Symbicort for similar issue and developed tongue swelling with it. It looks like her PCP also prescribed albuterol, but it does not look like the patient took this.  ROS:   Review of Systems  Constitutional: Positive for malaise/fatigue and diaphoresis. Negative for fever, chills and weight loss.  HENT: Positive for congestion, ear discharge, ear pain and sore throat. Negative for hearing loss, nosebleeds and tinnitus.   Eyes: Negative.   Respiratory: Positive for cough, sputum production, shortness of breath and wheezing. Negative for hemoptysis and stridor.   Cardiovascular: Negative.   Gastrointestinal: Positive for heartburn, nausea, vomiting, abdominal pain and constipation. Negative for  diarrhea, blood in stool and melena.  Genitourinary: Negative.   Musculoskeletal: Positive for myalgias. Negative for falls.  Skin: Negative for itching and rash.  Neurological: Positive for dizziness, weakness and headaches.  Endo/Heme/Allergies: Negative for environmental allergies and polydipsia. Does not bruise/bleed easily.  Psychiatric/Behavioral: Positive for depression. The patient is nervous/anxious.     Past Medical History:   Past Medical History  Diagnosis Date  . ANXIETY 07/23/2009  . DEPRESSION 07/23/2009  . DIVERTICULOSIS, COLON 07/23/2009  . GERD 07/23/2009  . HIP PAIN, LEFT 05/16/2010  . HYPERLIPIDEMIA 07/23/2009  . HYPERTENSION 07/23/2009  . HYPOTHYROIDISM 07/23/2009  . LOW BACK PAIN, CHRONIC 10/01/2009  . PARESTHESIA 10/01/2009  . IBS (irritable bowel syndrome)   . History of Crohn's disease   . Chronic pain   . Hypothyroidism   . Insomnia   . Headache(784.0) 07/23/2009    occasional  . Shingles 2006    back  . Hemorrhoids   . Complication of anesthesia 7 or 8 yrs ago    woke up during colonscopy    Past Surgical History:   Past Surgical History  Procedure Laterality Date  . Tonsillectomy  yrs ago  . Hemorrhoid surgery  yrs ago  . Appendectomy  yrs ago  . Bilateral salpingoophorectomy  age 46 or 76  . Abdominal hysterectomy  age 80 or 54  . Colonscopy  7 or 8 yrs ago  . Esophagogastroduodenoscopy N/A 11/17/2013    Procedure: ESOPHAGOGASTRODUODENOSCOPY (EGD);  Surgeon: Cleotis Nipper, MD;  Location: [facility];  Service: Endoscopy;  Laterality: N/A;  . Colonoscopy N/A  11/17/2013    Procedure: COLONOSCOPY;  Surgeon: Cleotis Nipper, MD;  Location: [facility];  Service: Endoscopy;  Laterality: N/A;    Social History:   Social History   Social History  . Marital Status: Widowed    Spouse Name: N/A  . Number of Children: 1  . Years of Education: N/A   Occupational History  . Not on file.   Social History Main Topics  . Smoking status:  Never Smoker   . Smokeless tobacco: Never Used  . Alcohol Use: No  . Drug Use: No  . Sexual Activity: Not on file   Other Topics Concern  . Not on file   Social History Narrative   Widowed.  Lives alone in her own home.  Ambulates with a cane/walker when needed.    Family history:   Family History  Problem Relation Age of Onset  . Clotting disorder Neg Hx   . COPD Sister   . Cancer Sister     Breast  . Cancer Mother     Unknown type    Allergies   Symbicort; Duloxetine; Metronidazole; and Rofecoxib  Current Medications:   Prior to Admission medications   Medication Sig Start Date End Date Taking? Authorizing Provider  bisacodyl (DULCOLAX) 5 MG EC tablet Take 5 mg by mouth daily.   Yes Historical Provider, MD  Cholecalciferol 2000 UNITS TABS Take 2,000 Units by mouth daily.   Yes Historical Provider, MD  clorazepate (TRANXENE) 7.5 MG tablet TAKE (1) TABLET TWICE DAILY AS NEEDED FOR ANXIETY. 01/22/15  Yes [name], MD  Dextromethorphan-Guaifenesin [facility] DM PO) Take 1 tablet by mouth every 12 (twelve) hours as needed (for cold).    Yes Historical Provider, MD  diltiazem (CARDIZEM CD) 240 MG 24 hr capsule TAKE (1) CAPSULE DAILY. 07/12/15  Yes [name], MD  levothyroxine (SYNTHROID, LEVOTHROID) 25 MCG tablet TAKE 1 TABLET DAILY BEFORE BREAKFAST. 06/18/15  Yes [name], MD  NEXIUM 40 MG capsule TAKE 1 CAPSULE DAILY BEFORE BREAKFAST. 02/07/15  Yes [name], MD  Polyethyl Glycol-Propyl Glycol (SYSTANE OP) Place 1 drop into both eyes 2 (two) times daily as needed (for dry eyes).    Yes Historical Provider, MD  promethazine (PHENERGAN) 25 MG tablet TAKE 1 TABLET EVERY 8 HOURS AS NEEDED FOR NAUSEA. 07/12/15  Yes [name], MD  sertraline (ZOLOFT) 25 MG tablet TAKE 1 TABLET EACH DAY. 02/27/15  Yes [name], MD  traMADol (ULTRAM) 50 MG tablet TAKE 1 TABLET EVERY SIX HOURS AS NEEDED FOR PAIN. 07/12/15  Yes [name], MD    traZODone (DESYREL) 50 MG tablet TAKE 1/2 TO 1 TABLET AT BEDTIME AS NEEDED FOR REST. 05/02/15  Yes [name], MD  zolpidem (AMBIEN) 5 MG tablet TAKE ONE TABLET AT BEDTIME. 06/18/15  Yes [name], MD  polyethylene glycol [facility] / Floria Raveling) packet Take 17 g by mouth daily. Patient not taking: Reported on 07/23/2015 08/22/14   Noland Fordyce, PA-C    Physical Exam:   Filed Vitals:   07/23/15 1144 07/23/15 1145 07/23/15 1245 07/23/15 1400  BP: 161/57 150/57 136/55 127/50  Pulse: 83 91 85 79  Temp: 98.1 F (36.7 C)     TempSrc: Oral     Resp: '18 21 14 13  '$ SpO2: 98% 98% 97% 98%     Physical Exam: Blood pressure 127/50, pulse 79, temperature 98.1 F (36.7 C), temperature source Oral, resp. rate 13, SpO2 98 %. Gen: No  acute distress. Head: Normocephalic, atraumatic. Eyes: PERRL, EOMI, sclerae nonicteric. Mouth: Oropharynx clear. Tongue midline. Neck: Supple, no thyromegaly, no lymphadenopathy, no jugular venous distention. Chest: Lung sounds are decreased with expiratory wheezes. CV: Heart sounds are regular. No murmurs, rubs, or gallops. Abdomen: Soft, nontender, nondistended with normal active bowel sounds. Extremities: Extremities are without clubbing, edema, or cyanosis. Skin: Warm and dry. Neuro: Alert and oriented. Grossly nonfocal. Psych: Mood and affect normal.   Data Review:    Labs: Basic Metabolic Panel:  Recent Labs Lab 07/23/15 1042  NA 141  K 3.3*  CL 102  CO2 27  GLUCOSE 125*  BUN 10  CREATININE 0.82  CALCIUM 9.3   CBC:  Recent Labs Lab 07/23/15 1042  WBC 8.6  NEUTROABS 6.5  HGB 12.9  HCT 39.2  MCV 90.5  PLT 344   Cardiac Enzymes:  Recent Labs Lab 07/23/15 1042  TROPONINI <0.03    Radiographic Studies: Dg Chest 2 View  07/23/2015   CLINICAL DATA:  Shortness of breath over several days  EXAM: CHEST  2 VIEW  COMPARISON:  February 18, 2011  FINDINGS: There is minimal scarring in the left base. Lungs elsewhere clear.  Heart size pulmonary vascularity are normal. No adenopathy. No bone lesions.  IMPRESSION: Slight scarring left base.  No edema or consolidation.   Electronically Signed   By: Lowella Grip III M.D.   On: 07/23/2015 10:45   *I have personally reviewed the images above*  EKG: Independently reviewed. Normal sinus rhythm at 88 bpm. Wandering baseline. No ischemic changes. No change from baseline.   Assessment/Plan:   Principal Problem:   Acute respiratory failure with hypoxia / bronchospasm secondary to acute bronchitis - Likely secondary to bronchitis/bronchospasm. - We'll hold off on antibiotics until the CT scan of her chest is completed. Bronchitis typically viral. - We'll give bronchodilators and steroids. Continue antitussives.  Active Problems:   Hypothyroidism - Continue Synthroid.    Anxiety and depression - Continue Tranxene and Zoloft.    Essential hypertension - Continue Cardizem CD.  DVT prophylaxis  Code Status: Full. Family Communication: Anastasio Champion [phone] Disposition Plan: Home when stable.  Time spent: 1 hour.  Desmin Daleo [facility] Pager [phone] Cell: [phone]   If 7PM-7AM, please contact night-coverage [email or web address] Password [facility] 07/23/2015, 3:31 PM

## 2015-07-23 NOTE — ED Provider Notes (Signed)
CSN: [id number]     Arrival date & time 07/23/15  5188 History   First MD Initiated Contact with Patient 07/23/15 [phone]     Chief Complaint  Patient presents with  . Shortness of Breath     (Consider location/radiation/quality/duration/timing/severity/associated sxs/prior Treatment) HPI   80 year old female with dyspnea. Onset this morning. Progressively throughout the day. Patient reports she is diagnosed with bronchitis on 8/3. Treated with cough medicine and an inhaler. No steroids or antibiotics. She denies any fevers or chills. No unusual leg pain or swelling. Denies any chest pain. Cough is nonproductive. No hemoptysis.  Past Medical History  Diagnosis Date  . ANXIETY 07/23/2009  . DEPRESSION 07/23/2009  . DIVERTICULOSIS, COLON 07/23/2009  . GERD 07/23/2009  . HIP PAIN, LEFT 05/16/2010  . HYPERLIPIDEMIA 07/23/2009  . HYPERTENSION 07/23/2009  . HYPOTHYROIDISM 07/23/2009  . LOW BACK PAIN, CHRONIC 10/01/2009  . PARESTHESIA 10/01/2009  . IBS (irritable bowel syndrome)   . History of Crohn's disease   . Chronic pain   . Hypothyroidism   . Insomnia   . Headache(784.0) 07/23/2009    occasional  . Shingles 2006    back  . Hemorrhoids   . Complication of anesthesia 7 or 8 yrs ago    woke up during colonscopy   Past Surgical History  Procedure Laterality Date  . Tonsillectomy  yrs ago  . Hemorrhoid surgery  yrs ago  . Appendectomy  yrs ago  . Bilateral salpingoophorectomy  age 20 or 89  . Abdominal hysterectomy  age 81 or 75  . Colonscopy  7 or 8 yrs ago  . Esophagogastroduodenoscopy N/A 11/17/2013    Procedure: ESOPHAGOGASTRODUODENOSCOPY (EGD);  Surgeon: Cleotis Nipper, MD;  Location: [facility];  Service: Endoscopy;  Laterality: N/A;  . Colonoscopy N/A 11/17/2013    Procedure: COLONOSCOPY;  Surgeon: Cleotis Nipper, MD;  Location: [facility];  Service: Endoscopy;  Laterality: N/A;   History reviewed. No pertinent family history. Social History  Substance Use Topics  .  Smoking status: Never Smoker   . Smokeless tobacco: Never Used  . Alcohol Use: No   OB History    No data available     Review of Systems  All systems reviewed and negative, other than as noted in HPI.   Allergies  Symbicort; Duloxetine; Metronidazole; and Rofecoxib  Home Medications   Prior to Admission medications   Medication Sig Start Date End Date Taking? Authorizing Provider  bisacodyl (DULCOLAX) 5 MG EC tablet Take 5 mg by mouth daily.   Yes Historical Provider, MD  Cholecalciferol 2000 UNITS TABS Take 2,000 Units by mouth daily.   Yes Historical Provider, MD  clorazepate (TRANXENE) 7.5 MG tablet TAKE (1) TABLET TWICE DAILY AS NEEDED FOR ANXIETY. 01/22/15  Yes [name], MD  Dextromethorphan-Guaifenesin [facility] DM PO) Take 1 tablet by mouth every 12 (twelve) hours as needed (for cold).    Yes Historical Provider, MD  diltiazem (CARDIZEM CD) 240 MG 24 hr capsule TAKE (1) CAPSULE DAILY. 07/12/15  Yes [name], MD  levothyroxine (SYNTHROID, LEVOTHROID) 25 MCG tablet TAKE 1 TABLET DAILY BEFORE BREAKFAST. 06/18/15  Yes [name], MD  NEXIUM 40 MG capsule TAKE 1 CAPSULE DAILY BEFORE BREAKFAST. 02/07/15  Yes [name], MD  Polyethyl Glycol-Propyl Glycol (SYSTANE OP) Place 1 drop into both eyes 2 (two) times daily as needed (for dry eyes).    Yes Historical Provider, MD  promethazine (PHENERGAN) 25 MG tablet TAKE 1 TABLET EVERY 8 HOURS AS  NEEDED FOR NAUSEA. 07/12/15  Yes [name], MD  sertraline (ZOLOFT) 25 MG tablet TAKE 1 TABLET EACH DAY. 02/27/15  Yes [name], MD  traMADol (ULTRAM) 50 MG tablet TAKE 1 TABLET EVERY SIX HOURS AS NEEDED FOR PAIN. 07/12/15  Yes [name], MD  traZODone (DESYREL) 50 MG tablet TAKE 1/2 TO 1 TABLET AT BEDTIME AS NEEDED FOR REST. 05/02/15  Yes [name], MD  zolpidem (AMBIEN) 5 MG tablet TAKE ONE TABLET AT BEDTIME. 06/18/15  Yes [name], MD  polyethylene glycol [facility] /  Floria Raveling) packet Take 17 g by mouth daily. Patient not taking: Reported on 07/23/2015 08/22/14   Noland Fordyce, PA-C   BP 136/55 mmHg  Pulse 85  Temp(Src) 98.1 F (36.7 C) (Oral)  Resp 14  SpO2 97% Physical Exam  Constitutional: She appears well-developed and well-nourished. No distress.  HENT:  Head: Normocephalic and atraumatic.  Eyes: Conjunctivae are normal. Right eye exhibits no discharge. Left eye exhibits no discharge.  Neck: Neck supple.  Cardiovascular: Normal rate, regular rhythm and normal heart sounds.  Exam reveals no gallop and no friction rub.   No murmur heard. Pulmonary/Chest: Effort normal. No respiratory distress. She has wheezes.  Abdominal: Soft. She exhibits no distension. There is no tenderness.  Musculoskeletal: She exhibits no edema or tenderness.  Neurological: She is alert.  Skin: Skin is warm and dry.  Psychiatric: She has a normal mood and affect. Her behavior is normal. Thought content normal.  Nursing note and vitals reviewed.   ED Course  Procedures (including critical care time) Labs Review Labs Reviewed  BASIC METABOLIC PANEL - Abnormal; Notable for the following:    Potassium 3.3 (*)    Glucose, Bld 125 (*)    All other components within normal limits  CBC WITH DIFFERENTIAL/PLATELET  BRAIN NATRIURETIC PEPTIDE  TROPONIN I    Imaging Review Dg Chest 2 View  07/23/2015   CLINICAL DATA:  Shortness of breath over several days  EXAM: CHEST  2 VIEW  COMPARISON:  February 18, 2011  FINDINGS: There is minimal scarring in the left base. Lungs elsewhere clear. Heart size pulmonary vascularity are normal. No adenopathy. No bone lesions.  IMPRESSION: Slight scarring left base.  No edema or consolidation.   Electronically Signed   By: Lowella Grip III M.D.   On: 07/23/2015 10:45   I, Latorria Zeoli, personally reviewed and evaluated these images and lab results as part of my medical decision-making.   EKG Interpretation None      MDM   Final  diagnoses:  Acute respiratory failure with hypoxia    79year-old female with dyspnea. Initially wheezing. Chest x-ray does not show any acute abnormality. She was ambulated and desaturated to the 80s. Her wheezing has improved significantly after nebs but she continues to remain hypoxemic which seems out of proportion to what I'm hearing.  Atypical for ACS. EKG is not normal, but does not appear acutely changed from EKG from September/2015. nitial troponin is normal. BNP is normal as well. Needs admission for continued hypoxemia. Will CT to eval for possible PE.   Virgel Manifold, MD 07/30/15 [phone]

## 2015-07-23 NOTE — Telephone Encounter (Signed)
Patient Name: Heather [name] DOB: Apr 07, 1935 Initial Comment Caller states she was diagnosed with bronchitis last week, woke up this morning with shortness of breath Nurse Assessment Nurse: Ronnald Ramp, RN, [place] Date/Time (Eastern Time): 07/23/2015 8:15:30 AM Confirm and document reason for call. If symptomatic, describe symptoms. ---Caller states she was diagnosed with Bronchitis last week, only told to take Mucinex DM. She has been having trouble breathing and wheezing that has gotten worse since Friday. Has the patient traveled out of the country within the last 30 days? ---Not Applicable Does the patient require triage? ---Yes Related visit to physician within the last 2 weeks? ---Yes Does the PT have any chronic conditions? (i.e. diabetes, asthma, etc.) ---Yes List chronic conditions. ---Thyroid, Depression, GERD, Syncope, HTN, Guidelines Guideline Title Affirmed Question Affirmed Notes Breathing Difficulty [1] MODERATE difficulty breathing (e.g., speaks in phrases, SOB even at rest, pulse 100-120) AND [2] NEW-onset or WORSE than normal Final Disposition User Go to ED Now Ronnald Ramp, RN, [facility] - ED Disagree/Comply: Comply

## 2015-07-23 NOTE — ED Notes (Signed)
Bed: CW88 Expected date:  Expected time:  Means of arrival:  Comments: EMS- 80yo F, SOB, recent bronchitis

## 2015-07-23 NOTE — ED Notes (Signed)
Will do ekg when pt returns to room

## 2015-07-23 NOTE — ED Notes (Signed)
Pt ambulated in the hall on room air. During ambulation O2 saturation was 85 and when resting O2 saturation was 92.

## 2015-07-23 NOTE — Progress Notes (Signed)
Patient threw up dinner.  Pt has had esophagus streched in the past and states she has been unable to tolerate much food the last few days.  MD made aware.

## 2015-07-24 ENCOUNTER — Encounter (HOSPITAL_COMMUNITY): Payer: Self-pay | Admitting: Internal Medicine

## 2015-07-24 DIAGNOSIS — R1310 Dysphagia, unspecified: Secondary | ICD-10-CM

## 2015-07-24 DIAGNOSIS — I2510 Atherosclerotic heart disease of native coronary artery without angina pectoris: Secondary | ICD-10-CM | POA: Diagnosis present

## 2015-07-24 DIAGNOSIS — I700 Atherosclerosis of aorta: Secondary | ICD-10-CM | POA: Diagnosis present

## 2015-07-24 HISTORY — DX: Atherosclerotic heart disease of native coronary artery without angina pectoris: I25.10

## 2015-07-24 HISTORY — DX: Atherosclerosis of aorta: I70.0

## 2015-07-24 MED ORDER — POTASSIUM CHLORIDE 20 MEQ/15ML (10%) PO SOLN
20.0000 meq | Freq: Two times a day (BID) | ORAL | Status: AC
  Administered 2015-07-24 (×2): 20 meq via ORAL
  Filled 2015-07-24 (×2): qty 15

## 2015-07-24 MED ORDER — TRAZODONE HCL 50 MG PO TABS
50.0000 mg | ORAL_TABLET | Freq: Every day | ORAL | Status: DC
  Administered 2015-07-24 – 2015-07-25 (×2): 50 mg via ORAL
  Filled 2015-07-24 (×2): qty 1

## 2015-07-24 MED ORDER — BISACODYL 10 MG RE SUPP
10.0000 mg | Freq: Every day | RECTAL | Status: DC | PRN
  Administered 2015-07-24: 10 mg via RECTAL
  Filled 2015-07-24: qty 1

## 2015-07-24 MED ORDER — ASPIRIN 81 MG PO CHEW
81.0000 mg | CHEWABLE_TABLET | Freq: Every day | ORAL | Status: DC
  Administered 2015-07-24 – 2015-07-26 (×3): 81 mg via ORAL
  Filled 2015-07-24 (×3): qty 1

## 2015-07-24 NOTE — Progress Notes (Signed)
Patient complaining of abdominal distention.  Requesting a suppository, states that miralax does not work.  Last BM on Sunday.  MD made aware.

## 2015-07-24 NOTE — Care Management Note (Signed)
Case Management Note  Patient Details  Name: LORRE OPDAHL MRN: [id number] Date of Birth: 20-Oct-1935  Subjective/Objective:                   Shortness of breath and wheezing Action/Plan: Discharge planning  Expected Discharge Date:   (unknown)               Expected Discharge Plan:  Home/Self Care  In-House Referral:     Discharge planning Services  CM Consult  Post Acute Care Choice:    Choice offered to:     DME Arranged:    DME Agency:     HH Arranged:    HH Agency:     Status of Service:  In process, will continue to follow  Medicare Important Message Given:    Date Medicare IM Given:    Medicare IM give by:    Date Additional Medicare IM Given:    Additional Medicare Important Message give by:     If discussed at [place] of Stay Meetings, dates discussed:    Additional Comments: Utilization Review completed.  CM notes pt lives alone at home and ambulates with aid of cane or walker.  Will continue to monitor for discharge needs. Dellie Catholic, RN 07/24/2015, 12:50 PM

## 2015-07-24 NOTE — Progress Notes (Signed)
Pt stated she felt dizzy after ambulating a short distance in the hall.  Had patient sit down in chair and wheeled her back to room.  Sats sustaining in the low 90's on RA during ambulation.  Will continue to monitor closely.

## 2015-07-24 NOTE — Progress Notes (Signed)
Pt unable to tolerate any solid food (only ate two bites and then threw up).  She was able to tolerate the full liquids throughout the day.  Gave zofran.  MD made aware.

## 2015-07-24 NOTE — Progress Notes (Signed)
While ambulating to [facility] patients sats dropped to mid 80's on room air.  Placed patient back on nasal cannula on 1 L, sats rose back up to mid 90's.

## 2015-07-24 NOTE — Progress Notes (Addendum)
Progress Note   [name] ZOX:[id number] DOB: 1935/01/12 DOA: 07/23/2015 PCP: [name], MD   Brief Narrative:   Heather [name] is an 80 y.o. female with no prior history of lung disease and no history of tobacco abuse (but noted to have emphysema on imaging studies) who was admitted with hypoxia and bronchospasm secondary to acute bronchitis. In the ED, her CT angiogram was negative for pulmonary embolism or pneumonia.  Assessment/Plan:   Principal Problem:  Acute respiratory failure with hypoxia / bronchospasm secondary to acute bronchitis - Secondary to acute bronchitis/bronchospasm. - No current indication for antibiotics. Bronchitis typically viral. - Continue bronchodilators and steroids. Continue antitussives. - Wean oxygen.  Active Problems:   Hypokalemia - Give KCL 20 mEq PO BID x 2 doses.    Dysphagia - H/O esophageal stricture.  Vomited last night.  Diet downgraded to [facility]. - ST evaluation requested.  May need esophogram.    Coronary artery and aortic atherosclerosis - Noted on CT of chest. - 2-D echo done 08/14/14 with no evidence of reduced EF or diastolic dysfunction. - ASA 81 mg daily initiated.   Hypothyroidism - Continue Synthroid.   Anxiety and depression - Continue Tranxene and Zoloft.   Essential hypertension - Continue Cardizem CD. Blood pressure controlled.    DVT prophylaxis - Lovenox ordered.  Code Status: Full. Family Communication: Kathrin Penner [phone] updated by telephone, grandson updated in room. Disposition Plan: Home when hypoxia resolved, dysphagia evaluated, 1-2 days.    Code Status Orders        Start     Ordered   07/23/15 1733  Full code   Continuous     07/23/15 1732    Advance Directive Documentation        Most Recent Value   Type of Advance Directive  Healthcare Power of Attorney, Living will   Pre-existing out of facility DNR order (yellow form or pink MOST form)       "MOST" Form in Place?          IV Access:    Peripheral IV   Procedures and diagnostic studies:   Dg Chest 2 View  07/23/2015   CLINICAL DATA:  Shortness of breath over several days  EXAM: CHEST  2 VIEW  COMPARISON:  February 18, 2011  FINDINGS: There is minimal scarring in the left base. Lungs elsewhere clear. Heart size pulmonary vascularity are normal. No adenopathy. No bone lesions.  IMPRESSION: Slight scarring left base.  No edema or consolidation.   Electronically Signed   By: Lowella Grip III M.D.   On: 07/23/2015 10:45   Ct Angio Chest Pe W/cm &/or Wo Cm  07/23/2015   CLINICAL DATA:  Patient arise from home complaining of shortness of breath beginning this morning. No pain. Diagnosed with bronchitis on 07/11/2015. No treatment with antibiotics. History of hypertension.  EXAM: CT ANGIOGRAPHY CHEST WITH CONTRAST  TECHNIQUE: Multidetector CT imaging of the chest was performed using the standard protocol during bolus administration of intravenous contrast. Multiplanar CT image reconstructions and MIPs were obtained to evaluate the vascular anatomy.  CONTRAST:  160m OMNIPAQUE IOHEXOL 350 MG/ML SOLN  COMPARISON:  Chest radiograph, 07/23/2015.  FINDINGS: Angiographic study: No evidence of a pulmonary embolus. Great vessels are normal in caliber. No aortic dissection. Mild partly calcified atherosclerotic plaque along the aortic arch and at the origin of the arch branch vessels.  Thoracic inlet: No mass or adenopathy. Visualized thyroid is unremarkable.  Mediastinum and  hila: Heart is normal in size. Mild coronary artery calcifications. No mediastinal or hilar masses or pathologically enlarged lymph nodes.  Lungs and pleura: No lung consolidation or edema. No mass or suspicious nodule. Mild bronchial wall thickening is noted to the lower lobes, right greater than left, and minimally in the central right middle lobe and left upper lobe lingula. This may all be chronic. An acute component of  bronchitis is possible. There are moderate changes of centrilobular emphysema most evident in the upper lobes. No pleural effusion or pneumothorax.  Limited upper abdomen: Atherosclerotic changes of the upper abdominal aorta. Otherwise unremarkable.  Musculoskeletal:  Unremarkable.  Review of the MIP images confirms the above findings.  IMPRESSION: 1. No evidence of a pulmonary embolus. 2. Mild bronchial wall thickening in the lower lungs, which may all reflect chronic bronchitis. An acute component is possible. 3. No other evidence of an acute abnormality. No evidence of pneumonia or pulmonary edema. 4. Moderate emphysema. 5. Coronary artery and aortic atherosclerotic calcifications.   Electronically Signed   By: Lajean Manes M.D.   On: 07/23/2015 15:59     Medical Consultants:    None.  Anti-Infectives:    None.  Subjective:    Heather [name] still is a "little bit" dyspneic.  Says she didn't sleep well last night.  Vomited up her dinner last night.  Has trouble swallowing pills.  Has chronic pain "all over".  No BM for 2 days.  Objective:    Filed Vitals:   07/23/15 1730 07/23/15 1910 07/23/15 2053 07/24/15 0538  BP: 154/60  142/69 126/54  Pulse:   102 72  Temp:   97.7 F (36.5 C) 98.1 F (36.7 C)  TempSrc:   Oral Oral  Resp:   18 18  Height:      Weight:      SpO2:  98% 96% 100%    Intake/Output Summary (Last 24 hours) at 07/24/15 0938 Last data filed at 07/23/15 2134  Gross per 24 hour  Intake      3 ml  Output    350 ml  Net   -347 ml    Exam: Gen:  NAD Cardiovascular:  RRR, No M/R/G Respiratory:  Lungs with faint exp wheezes bilaterally Gastrointestinal:  Abdomen soft, NT/ND, + BS Extremities:  No C/E/C   Data Reviewed:    Labs: Basic Metabolic Panel:  Recent Labs Lab 07/23/15 1042  NA 141  K 3.3*  CL 102  CO2 27  GLUCOSE 125*  BUN 10  CREATININE 0.82  CALCIUM 9.3   GFR Estimated Creatinine Clearance: 38.6 mL/min (by C-G formula  based on Cr of 0.82).  CBC:  Recent Labs Lab 07/23/15 1042  WBC 8.6  NEUTROABS 6.5  HGB 12.9  HCT 39.2  MCV 90.5  PLT 344   Cardiac Enzymes:  Recent Labs Lab 07/23/15 1042  TROPONINI <0.03   Microbiology No results found for this or any previous visit (from the past 240 hour(s)).   Medications:   . albuterol  2.5 mg Nebulization BID  . bisacodyl  5 mg Oral Daily  . chlorpheniramine-HYDROcodone  5 mL Oral Q12H  . cholecalciferol  2,000 Units Oral Daily  . dextromethorphan-guaiFENesin  1 tablet Oral BID  . diltiazem  240 mg Oral Daily  . enoxaparin (LOVENOX) injection  40 mg Subcutaneous Q24H  . feeding supplement (ENSURE ENLIVE)  237 mL Oral BID BM  . levothyroxine  25 mcg Oral QAC breakfast  . pantoprazole  40 mg  Oral Daily  . predniSONE  40 mg Oral Q breakfast  . sertraline  25 mg Oral Daily  . sodium chloride  3 mL Intravenous Q12H  . sodium chloride  3 mL Intravenous Q12H   Continuous Infusions:   Time spent: 35 minutes with > 50% of time discussing current diagnostic test results, clinical impression and plan of care with the patient and her family.   LOS: 1 day   Eddie Koc  [facility] Pager [phone]. If unable to reach me by pager, please call my cell phone at [phone].  *Please refer to [email or web address], password [email or web address] to get updated schedule on who will round on this patient, as hospitalists switch teams weekly. If 7PM-7AM, please contact night-coverage at [email or web address], password [email or web address] for any overnight needs.  07/24/2015, 7:29 AM

## 2015-07-24 NOTE — Progress Notes (Signed)
Initial Nutrition Assessment  DOCUMENTATION CODES:   Not applicable  INTERVENTION:  - Continue Ensure Enlive BID, each supplement provides 350 kcal and 20 grams of protein - Will order Magic cup BID with meals, each supplement provides 290 kcal and 9 grams of protein - RD will continue to monitor for needs  NUTRITION DIAGNOSIS:   Swallowing difficulty related to dysphagia as evidenced by per patient/family report.  GOAL:   Patient will meet greater than or equal to 90% of their needs  MONITOR:   PO intake, Supplement acceptance, Weight trends, Labs, I & O's  REASON FOR ASSESSMENT:   Malnutrition Screening Tool  ASSESSMENT:   80 y.o. female no prior history of lung disease and no history of tobacco abuse who presents with a 1 week history of progressive dyspnea. She saw her PCP on 07/11/15 for her routine six-month checkup and was noted to be wheezing at that time (but was not short of breath). She was advised to take Mucinex DM, and to call back if she develops any shortness of breath. She has developed progressive shortness of breath over the past week. She called her PCP this morning and was advised to come to the ED for further evaluation.  Pt seen for MST. BMI indicates normal weight status. Pt was on Regular diet but vomited after dinner last night and was subsequently downgraded to FLD. She states she felt nauseated before emesis but had not taken anti-nausea medication for meal. She has had one episode of nausea without emesis today and was given anti-nausea medication at that time.  Pt consumed 75% of breakfast this AM per chart review. Visualized lunch tray with 100% completion of ice cream cup and 50% of soup. Pt states that cold items feel good on her throat. Per notes, she has hx of esophageal dilation and she had reported inability to tolerate much food the last few days related to this.   Pt drinks Ensure and Boost at home and has had a whole one earlier today. She  is thankful for [employer] order. She states she typically eats 2 meals/day because she eats so slowly and that she has a bowl of ice cream each night.  Very mild muscle wasting to upper body. Per weight hx, pt has lost 2 lbs (2% body weight) in the past 12 days which is not significant for time frame.  Likely not fully meeting needs since admission. Medications reviewed. Labs reviewed; K: 3.3 mmol/L.    Diet Order:  DIET SOFT Room service appropriate?: Yes; Fluid consistency:: Thin  Skin:  Reviewed, no issues  Last BM:  8/14  Height:   Ht Readings from Last 1 Encounters:  07/23/15 '4\' 11"'$  (1.499 m)    Weight:   Wt Readings from Last 1 Encounters:  07/23/15 108 lb 3.9 oz (49.1 kg)    Ideal Body Weight:  44.1 kg (kg)  BMI:  Body mass index is 21.85 kg/(m^2).  Estimated Nutritional Needs:   Kcal:  0160-1093  Protein:  50-60 grams  Fluid:  2 L/day  EDUCATION NEEDS:   No education needs identified at this time     Jarome Matin, RD, LDN Inpatient Clinical Dietitian Pager # [phone] After hours/weekend pager # [phone]

## 2015-07-24 NOTE — Evaluation (Addendum)
Clinical/Bedside Swallow Evaluation Patient Details  Name: SKYLARR LIZ MRN: [id number] Date of Birth: March 14, 1935  Today's Date: 07/24/2015 Time: SLP Start Time (ACUTE ONLY): 1139 SLP Stop Time (ACUTE ONLY): 1226 SLP Time Calculation (min) (ACUTE ONLY): 47 min  Past Medical History:  Past Medical History  Diagnosis Date  . ANXIETY 07/23/2009  . DEPRESSION 07/23/2009  . DIVERTICULOSIS, COLON 07/23/2009  . GERD 07/23/2009  . HIP PAIN, LEFT 05/16/2010  . HYPERLIPIDEMIA 07/23/2009  . HYPERTENSION 07/23/2009  . HYPOTHYROIDISM 07/23/2009  . LOW BACK PAIN, CHRONIC 10/01/2009  . PARESTHESIA 10/01/2009  . IBS (irritable bowel syndrome)   . History of Crohn's disease   . Chronic pain   . Hypothyroidism   . Insomnia   . Headache(784.0) 07/23/2009    occasional  . Shingles 2006    back  . Hemorrhoids   . Complication of anesthesia 7 or 8 yrs ago    woke up during colonscopy  . Atherosclerosis of aorta 07/24/2015  . Coronary atherosclerosis 07/24/2015   Past Surgical History:  Past Surgical History  Procedure Laterality Date  . Tonsillectomy  yrs ago  . Hemorrhoid surgery  yrs ago  . Appendectomy  yrs ago  . Bilateral salpingoophorectomy  age 55 or 20  . Abdominal hysterectomy  age 89 or 81  . Colonscopy  7 or 8 yrs ago  . Esophagogastroduodenoscopy N/A 11/17/2013    Procedure: ESOPHAGOGASTRODUODENOSCOPY (EGD);  Surgeon: Cleotis Nipper, MD;  Location: [facility];  Service: Endoscopy;  Laterality: N/A;  . Colonoscopy N/A 11/17/2013    Procedure: COLONOSCOPY;  Surgeon: Cleotis Nipper, MD;  Location: [facility];  Service: Endoscopy;  Laterality: N/A;   HPI:  80 yo female adm to [facility] with acute respiratory failure.  PMH + GERD, esophageal dysmotility and narrowing s/p dilatation per pt.   (*Although note from 11/2013 EGD indicated normal endoscopy and no dilatation).  Pt also with nutrition risk- weight loss of 10 lbs since September 2015 per pt, bronchiectasis, CAD, anxiety, lung  cancer.  Pt reports she had esophageal endoscopy previously with some improvement.  EPIC indicates study completed 11/2013 with normal findings.  Esophagram completed 03/2014 showed short distal esophageal stricture and dysmotility.  Pt reports  she last saw Dr Kalman Shan in September 2015 due to dysphagia symptoms and he informed her that as we age certain parts can not be fixed - and we learn to deal with them.  She states Dr Kalman Shan informed her that she did not need to come back to see him.   Pt states her swallow ability has been problematic since September without worsening however she has lost weight due to vomiting occuring a few times a week.     Assessment / Plan / Recommendation Clinical Impression  Pt with functional oropharyngeal swallow based on clinical swallow evaluation.  No focal CN deficits nor s/s of aspiration with po observed.   Pt does report problems with sensation of food lodging in esophagus - followed by belching and subsequently vomiting a few times a week.  She does admit she only eats 2 meals a day and consumes icecream at midnight.    Pt reports good tolerance of liquid breakfast today.  Vomiting occurred last pm tuna and boiled egg, SLP ?s if food was too dense given pt dysmotility.  Upon further interview, pt admits she can tolerate approximately 1/4 of meal in before occurences.  Advised her to esophageal precautions to migitate her dysphagia.   Suspect symptoms are consistent with dysmotility.  Advised pt consume several small meals/day, consume liquids t/o meal *consider room temp, masticate food thoroughly, maximize liquid nutritional supplment as able, etc.  She advised she would attempt these strategies to see if helpful.    Pt reports she last saw GI MD in September 2015 due to dysphagia symptoms and he informed her that as we age certain parts can not be fixed - and we learn to deal with them.  She states GI MD informed her that she did not need to come back to see  him.   Recommend advance diet as tolerated using general esophageal precautions.  Thanks for allowing me to help with this pt's care.     Aspiration Risk  Mild    Diet Recommendation Age appropriate regular solids;Thin   Medication Administration: Whole meds with liquid (pt has taken pills with pudding previously but reports no longer need to do so) Compensations: Small sips/bites;Slow rate;Follow solids with liquid    Other  Recommendations Oral Care Recommendations: Oral care BID             SLP Swallow Goals  n/a   Swallow Study Prior Functional Status   see hhx    General Date of Onset: 07/24/15 Other Pertinent Information: 80 yo female adm to [facility] with acute respiratory failure.  PMH + GERD, esophageal dysmotility and narrowing s/p dilatation per pt.   (*Although note from 11/2013 EGD indicated normal endoscopy and no dilatation).  Pt also with nutrition risk- weight loss of 10 lbs since September 2015 per pt, bronchiectasis, CAD, anxiety, lung cancer.  Pt reports she had her esophagus stretched previously with some improvement.  EPIC indicates study completed 2014 with normal findings.  Esophagram completed 03/2014 showed short distal esophageal stricture and dysmotility.  Pt reports  she last saw Dr Kalman Shan in September 2015 due to dysphagia symptoms and he informed her that as we age certain parts can not be fixed - and we learn to deal with them.  She states Dr Kalman Shan informed her that she did not need to come back to see him.   Pt states her swallow ability has been consistent since September however she has lost weight due to vomiting occuring a few times a week.   Type of Study: Bedside swallow evaluation Previous Swallow Assessment: see HHX Diet Prior to this Study: Thin liquids Temperature Spikes Noted: No Respiratory Status: Supplemental O2 delivered via (comment) (pt reports being xerostomic) Behavior/Cognition: Alert;Cooperative;Pleasant mood Oral Cavity -  Dentition: Adequate natural dentition/normal for age;Other (Comment) (upper denture) Self-Feeding Abilities: Able to feed self Patient Positioning: Upright in chair/Tumbleform Baseline Vocal Quality: Normal Volitional Swallow: Able to elicit    Oral/Motor/Sensory Function Overall Oral Motor/Sensory Function: Appears within functional limits for tasks assessed   Ice Chips Ice chips: Not tested   Thin Liquid Thin Liquid: Within functional limits Presentation: Cup;Self Fed    Nectar Thick Nectar Thick Liquid: Within functional limits Presentation: Cup;Self Fed Other Comments: Ensure   Honey Thick Honey Thick Liquid: Not tested   Puree Puree: Within functional limits Presentation: Self Fed;Spoon   Solid   GO    Solid: Within functional limits Presentation: Self Fed Other Comments: cracker mixed with soup       Luanna Salk, [facility] SLP [phone]

## 2015-07-25 DIAGNOSIS — I10 Essential (primary) hypertension: Secondary | ICD-10-CM

## 2015-07-25 DIAGNOSIS — J9601 Acute respiratory failure with hypoxia: Principal | ICD-10-CM

## 2015-07-25 DIAGNOSIS — E039 Hypothyroidism, unspecified: Secondary | ICD-10-CM

## 2015-07-25 DIAGNOSIS — F418 Other specified anxiety disorders: Secondary | ICD-10-CM

## 2015-07-25 MED ORDER — ENSURE ENLIVE PO LIQD: 237.0000 mL | Freq: Two times a day (BID) | ORAL | Status: DC

## 2015-07-25 MED ORDER — ALUM & MAG HYDROXIDE-SIMETH 200-200-20 MG/5ML PO SUSP: 30.0000 mL | Freq: Four times a day (QID) | ORAL | Status: DC | PRN

## 2015-07-25 MED ORDER — ASPIRIN 81 MG PO CHEW: 81.0000 mg | CHEWABLE_TABLET | Freq: Every day | ORAL | Status: DC

## 2015-07-25 MED ORDER — ACETAMINOPHEN 325 MG PO TABS: 650.0000 mg | ORAL_TABLET | Freq: Four times a day (QID) | ORAL | Status: DC | PRN

## 2015-07-25 MED ORDER — PREDNISONE 50 MG PO TABS: 50.0000 mg | ORAL_TABLET | Freq: Every day | ORAL | Status: DC

## 2015-07-25 NOTE — Discharge Summary (Signed)
Physician Discharge Summary  Heather [name] YTK:[id number] DOB: 1935/10/21 DOA: 07/23/2015  PCP: [name], MD  Admit date: 07/23/2015 Discharge date: 07/25/2015  Recommendations for Outpatient Follow-up:  1. Taper down prednisone starting from 50 mg a day, taper down by 5 mg a day down to 0 mg. for ex, today 50 mg, tomorrow 45 mg, then 40 mg the following day and etc... 2. Please avoid multiple sleeping agents. Trazodone continued.  Discharge Diagnoses:  Principal Problem:   Acute respiratory failure with hypoxia Active Problems:   Hypothyroidism   Anxiety and depression   Essential hypertension   Bronchospasm with bronchitis, acute   Coronary atherosclerosis   Atherosclerosis of aorta   Dysphagia    Discharge Condition: stable   Diet recommendation: as tolerated   History of present illness:  80 y.o. female with no prior history of lung disease and no history of tobacco abuse (but noted to have emphysema on imaging studies) who was admitted with hypoxia and bronchospasm secondary to acute bronchitis. In the ED, her CT angiogram was negative for pulmonary embolism. No acute cardiopulmonary findings were seen on CXR.   Assessment/Plan:    Principal Problem:  Acute respiratory failure with hypoxia / bronchospasm secondary to acute bronchitis - Respiratory status stable - Taper prednisone as indicated above on discharge.  - No current indication for antibiotics. Bronchitis typically viral.  Active Problems:  Hypokalemia - Due to nebulizer treatments - Potassium supplemented    Dysphagia - H/O esophageal stricture. - Tolerates soft food.   Coronary artery and aortic atherosclerosis - Noted on CT of chest. - 2-D echo done 08/14/14 showed no evidence of reduced EF or diastolic dysfunction. - Continue daily aspirin    Hypothyroidism - Continue Synthroid.   Anxiety and depression - Continue Zoloft    Essential hypertension - Continue  Cardizem CD.   DVT Prophylaxis  - Lovenox subQ ordered    Code Status: Full.  Family Communication:  plan of care discussed with the patient Disposition Plan: to SNF   IV access:  Peripheral IV  Procedures and diagnostic studies:    Dg Chest 2 View 07/23/2015   Slight scarring left base.  No edema or consolidation.   Electronically Signed   By: Lowella Grip III M.D.   On: 07/23/2015 10:45   Ct Angio Chest Pe W/cm &/or Wo Cm 07/23/2015    1. No evidence of a pulmonary embolus. 2. Mild bronchial wall thickening in the lower lungs, which may all reflect chronic bronchitis. An acute component is possible. 3. No other evidence of an acute abnormality. No evidence of pneumonia or pulmonary edema. 4. Moderate emphysema. 5. Coronary artery and aortic atherosclerotic calcifications.     Medical Consultants:  None   Other Consultants:  Physical therapy   IAnti-Infectives:   None    Signed:  Leisa Lenz, MD  [facility] 07/25/2015, 7:54 PM  Pager #: [phone]  Time spent in minutes: more than 30 minutes    Discharge Exam: Filed Vitals:   07/25/15 1506  BP: 116/57  Pulse: 71  Temp: 97.9 F (36.6 C)  Resp: 20   Filed Vitals:   07/25/15 0746 07/25/15 1108 07/25/15 1506 07/25/15 1943  BP:   116/57   Pulse:   71   Temp:   97.9 F (36.6 C)   TempSrc:   Oral   Resp:   20   Height:      Weight:      SpO2: 88% 83% 97% 94%  General: Pt is alert, follows commands appropriately, not in acute distress Cardiovascular: Rate controlled, S1/S2 +, no murmurs Respiratory: Clear to auscultation bilaterally, no wheezing, no crackles, no rhonchi Abdominal: Soft, non tender, non distended, bowel sounds +, no guarding Extremities: no edema, no cyanosis, pulses palpable bilaterally DP and PT Neuro: Grossly nonfocal  Discharge Instructions  Discharge Instructions    Call MD for:  difficulty breathing, headache or visual disturbances    Complete by:  As directed       Call MD for:  persistant nausea and vomiting    Complete by:  As directed      Call MD for:  severe uncontrolled pain    Complete by:  As directed      Diet - low sodium heart healthy    Complete by:  As directed      Discharge instructions    Complete by:  As directed   1. Taper down prednisone starting from 50 mg a day, taper down by 5 mg a day down to 0 mg. for ex, today 50 mg, tomorrow 45 mg, then 40 mg the following day and etc... 2. Please avoid multiple sleeping agents. Trazodone continued.     Increase activity slowly    Complete by:  As directed             Medication List    STOP taking these medications        polyethylene glycol packet  Commonly known as:  MIRALAX / GLYCOLAX     traMADol 50 MG tablet  Commonly known as:  ULTRAM     zolpidem 5 MG tablet  Commonly known as:  AMBIEN      TAKE these medications        acetaminophen 325 MG tablet  Commonly known as:  TYLENOL  Take 2 tablets (650 mg total) by mouth every 6 (six) hours as needed for mild pain (or Fever >/= 101).     alum & mag hydroxide-simeth 200-200-20 MG/5ML suspension  Commonly known as:  MAALOX/MYLANTA  Take 30 mLs by mouth every 6 (six) hours as needed for indigestion or heartburn (dyspepsia).     aspirin 81 MG chewable tablet  Chew 1 tablet (81 mg total) by mouth daily.     bisacodyl 5 MG EC tablet  Commonly known as:  DULCOLAX  Take 5 mg by mouth daily.     Cholecalciferol 2000 UNITS Tabs  Take 2,000 Units by mouth daily.     clorazepate 7.5 MG tablet  Commonly known as:  TRANXENE  TAKE (1) TABLET TWICE DAILY AS NEEDED FOR ANXIETY.     diltiazem 240 MG 24 hr capsule  Commonly known as:  CARDIZEM CD  TAKE (1) CAPSULE DAILY.     feeding supplement (ENSURE ENLIVE) Liqd  Take 237 mLs by mouth 2 (two) times daily between meals.     levothyroxine 25 MCG tablet  Commonly known as:  SYNTHROID, LEVOTHROID  TAKE 1 TABLET DAILY BEFORE BREAKFAST.     MUCINEX DM PO  Take 1 tablet  by mouth every 12 (twelve) hours as needed (for cold).     NEXIUM 40 MG capsule  Generic drug:  esomeprazole  TAKE 1 CAPSULE DAILY BEFORE BREAKFAST.     predniSONE 50 MG tablet  Commonly known as:  DELTASONE  Take 1 tablet (50 mg total) by mouth daily with breakfast.     promethazine 25 MG tablet  Commonly known as:  PHENERGAN  TAKE 1 TABLET EVERY 8  HOURS AS NEEDED FOR NAUSEA.     sertraline 25 MG tablet  Commonly known as:  ZOLOFT  TAKE 1 TABLET EACH DAY.     SYSTANE OP  Place 1 drop into both eyes 2 (two) times daily as needed (for dry eyes).     traZODone 50 MG tablet  Commonly known as:  DESYREL  TAKE 1/2 TO 1 TABLET AT BEDTIME AS NEEDED FOR REST.           Follow-up Information    Follow up with [name], MD. Schedule an appointment as soon as possible for a visit in 1 week.   Specialty:  Internal Medicine   Why:  Follow up appt after recent hospitalization   Contact information:   [place] [address] [phone]        The results of significant diagnostics from this hospitalization (including imaging, microbiology, ancillary and laboratory) are listed below for reference.    Significant Diagnostic Studies: Dg Chest 2 View  07/23/2015   CLINICAL DATA:  Shortness of breath over several days  EXAM: CHEST  2 VIEW  COMPARISON:  February 18, 2011  FINDINGS: There is minimal scarring in the left base. Lungs elsewhere clear. Heart size pulmonary vascularity are normal. No adenopathy. No bone lesions.  IMPRESSION: Slight scarring left base.  No edema or consolidation.   Electronically Signed   By: Lowella Grip III M.D.   On: 07/23/2015 10:45   Ct Angio Chest Pe W/cm &/or Wo Cm  07/23/2015   CLINICAL DATA:  Patient arise from home complaining of shortness of breath beginning this morning. No pain. Diagnosed with bronchitis on 07/11/2015. No treatment with antibiotics. History of hypertension.  EXAM: CT ANGIOGRAPHY CHEST WITH  CONTRAST  TECHNIQUE: Multidetector CT imaging of the chest was performed using the standard protocol during bolus administration of intravenous contrast. Multiplanar CT image reconstructions and MIPs were obtained to evaluate the vascular anatomy.  CONTRAST:  175m OMNIPAQUE IOHEXOL 350 MG/ML SOLN  COMPARISON:  Chest radiograph, 07/23/2015.  FINDINGS: Angiographic study: No evidence of a pulmonary embolus. Great vessels are normal in caliber. No aortic dissection. Mild partly calcified atherosclerotic plaque along the aortic arch and at the origin of the arch branch vessels.  Thoracic inlet: No mass or adenopathy. Visualized thyroid is unremarkable.  Mediastinum and hila: Heart is normal in size. Mild coronary artery calcifications. No mediastinal or hilar masses or pathologically enlarged lymph nodes.  Lungs and pleura: No lung consolidation or edema. No mass or suspicious nodule. Mild bronchial wall thickening is noted to the lower lobes, right greater than left, and minimally in the central right middle lobe and left upper lobe lingula. This may all be chronic. An acute component of bronchitis is possible. There are moderate changes of centrilobular emphysema most evident in the upper lobes. No pleural effusion or pneumothorax.  Limited upper abdomen: Atherosclerotic changes of the upper abdominal aorta. Otherwise unremarkable.  Musculoskeletal:  Unremarkable.  Review of the MIP images confirms the above findings.  IMPRESSION: 1. No evidence of a pulmonary embolus. 2. Mild bronchial wall thickening in the lower lungs, which may all reflect chronic bronchitis. An acute component is possible. 3. No other evidence of an acute abnormality. No evidence of pneumonia or pulmonary edema. 4. Moderate emphysema. 5. Coronary artery and aortic atherosclerotic calcifications.   Electronically Signed   By: DLajean ManesM.D.   On: 07/23/2015 15:59    Microbiology: No results found for this or any previous  visit (from the  past 240 hour(s)).   Labs: Basic Metabolic Panel:  Recent Labs Lab 07/23/15 1042  NA 141  K 3.3*  CL 102  CO2 27  GLUCOSE 125*  BUN 10  CREATININE 0.82  CALCIUM 9.3   Liver Function Tests: No results for input(s): AST, ALT, ALKPHOS, BILITOT, PROT, ALBUMIN in the last 168 hours. No results for input(s): LIPASE, AMYLASE in the last 168 hours. No results for input(s): AMMONIA in the last 168 hours. CBC:  Recent Labs Lab 07/23/15 1042  WBC 8.6  NEUTROABS 6.5  HGB 12.9  HCT 39.2  MCV 90.5  PLT 344   Cardiac Enzymes:  Recent Labs Lab 07/23/15 1042  TROPONINI <0.03   BNP: BNP (last 3 results)  Recent Labs  07/23/15 1042  BNP 24.3    ProBNP (last 3 results) No results for input(s): PROBNP in the last 8760 hours.  CBG: No results for input(s): GLUCAP in the last 168 hours.

## 2015-07-25 NOTE — Plan of Care (Signed)
Problem: Phase II Progression Outcomes Goal: Vital signs remain stable Outcome: Progressing O2 sats drop into the 70s while ambulating on room air. At rest levels stay 98-100% on room air.

## 2015-07-25 NOTE — Clinical Social Work Placement (Signed)
   CLINICAL SOCIAL WORK PLACEMENT  NOTE  Date:  07/25/2015  Patient Details  Name: Heather [name] MRN: [id number] Date of Birth: 1935/03/10  Clinical Social Work is seeking post-discharge placement for this patient at the [place] level of care (*CSW will initial, date and re-position this form in  chart as items are completed):  Yes   Patient/family provided with [place] Work Department's list of facilities offering this level of care within the geographic area requested by the patient (or if unable, by the patient's family).  Yes   Patient/family informed of their freedom to choose among providers that offer the needed level of care, that participate in Medicare, Medicaid or managed care program needed by the patient, have an available bed and are willing to accept the patient.  Yes   Patient/family informed of [facility]'s ownership interest in [facility] and [facility], as well as of the fact that they are under no obligation to receive care at these facilities.  PASRR submitted to EDS on 07/25/15     PASRR number received on 07/25/15     Existing PASRR number confirmed on       FL2 transmitted to all facilities in geographic area requested by pt/family on 07/25/15     FL2 transmitted to all facilities within larger geographic area on       Patient informed that his/her managed care company has contracts with or will negotiate with certain facilities, including the following:        Yes   Patient/family informed of bed offers received.  Patient chooses bed at       Physician recommends and patient chooses bed at      Patient to be transferred to   on  .  Patient to be transferred to facility by       Patient family notified on   of transfer.  Name of family member notified:        PHYSICIAN       Additional Comment:    _______________________________________________ Standley Brooking, LCSW 07/25/2015, 3:08  PM

## 2015-07-25 NOTE — Evaluation (Signed)
Physical Therapy Evaluation Patient Details Name: Heather [name] MRN: [id number] DOB: 1935/03/30 Today's Date: 07/25/2015   History of Present Illness  80 y.o. female admitted with dyspnea, productive cough, N/V.  Dx acute respiratory failure.   Clinical Impression  Pt admitted with above diagnosis. Pt currently with functional limitations due to the deficits listed below (see PT Problem List). Pt ambulated 77' with RW and min/guard assist, SaO2 dropped to 83% on RA while walking. Pt lives alone, doesn't have assistance available, and would benefit from ST-SNF for strengthening prior to returning home. * Pt will benefit from skilled PT to increase their independence and safety with mobility to allow discharge to the venue listed below.       Follow Up Recommendations SNF;Supervision for mobility/OOB    Equipment Recommendations  None recommended by PT    Recommendations for Other Services       Precautions / Restrictions Precautions Precautions: Fall Precaution Comments: monitor O2 Restrictions Weight Bearing Restrictions: No      Mobility  Bed Mobility Overal bed mobility: Modified Independent             General bed mobility comments: with rail  Transfers Overall transfer level: Needs assistance Equipment used: Rolling walker (2 wheeled) Transfers: Sit to/from Stand Sit to Stand: Min guard         General transfer comment: min guard for safety/balance  Ambulation/Gait Ambulation/Gait assistance: Min guard Ambulation Distance (Feet): 85 Feet Assistive device: Rolling walker (2 wheeled) Gait Pattern/deviations: Step-through pattern;Decreased stride length   Gait velocity interpretation: Below normal speed for age/gender General Gait Details: steady with RW, SaO2 dropped to 83% on RA with walking and pt reported dizziness, 97% on 2L O2 [place] at rest  Stairs            Wheelchair Mobility    Modified Rankin (Stroke Patients Only)       Balance  Overall balance assessment: Needs assistance   Sitting balance-Leahy Scale: Good       Standing balance-Leahy Scale: Fair                               Pertinent Vitals/Pain      Home Living Family/patient expects to be discharged to:: Private residence Living Arrangements: Alone     Home Access: Stairs to enter Entrance Stairs-Rails: Right;Left;Can reach both Entrance Stairs-Number of Steps: 3 Home Layout: One level Home Equipment: [place] - 4 wheels;Cane - quad;Bedside commode;Shower seat;Grab bars - tub/shower;Hand held shower head Additional Comments: walk in shower, hand held shower    Prior Function Level of Independence: Independent with assistive device(s)               Hand Dominance        Extremity/Trunk Assessment   Upper Extremity Assessment: Overall WFL for tasks assessed           Lower Extremity Assessment: Overall WFL for tasks assessed      Cervical / Trunk Assessment: Normal  Communication   Communication: No difficulties  Cognition Arousal/Alertness: Awake/alert Behavior During Therapy: WFL for tasks assessed/performed Overall Cognitive Status: Within Functional Limits for tasks assessed                      General Comments      Exercises        Assessment/Plan    PT Assessment Patient needs continued PT services  PT Diagnosis Difficulty walking  PT Problem List Decreased activity tolerance;Decreased mobility;Cardiopulmonary status limiting activity  PT Treatment Interventions Gait training;Functional mobility training;Therapeutic activities;Patient/family education;Therapeutic exercise   PT Goals (Current goals can be found in the Care Plan section) Acute Rehab PT Goals Patient Stated Goal: to be strong enough to be independent at home PT Goal Formulation: With patient Time For Goal Achievement: 08/08/15 Potential to Achieve Goals: Good    Frequency Min 3X/week   Barriers to discharge  Decreased caregiver support      Co-evaluation               End of Session Equipment Utilized During Treatment: Gait belt;Oxygen Activity Tolerance: Patient limited by fatigue;Treatment limited secondary to medical complications (Comment) Patient left: in bed;with call bell/phone within reach Nurse Communication: Mobility status;Other (comment) (O2 sats with walking)         Time: 1007-1219 PT Time Calculation (min) (ACUTE ONLY): 25 min   Charges:   PT Evaluation $Initial PT Evaluation Tier I: 1 Procedure PT Treatments $Gait Training: 8-22 mins   PT G Codes:        Blondell Reveal Kistler 07/25/2015, 11:13 AM [phone]

## 2015-07-25 NOTE — Clinical Social Work Note (Signed)
Clinical Social Work Assessment  Patient Details  Name: Heather [name] MRN: [id number] Date of Birth: 12/20/1934  Date of referral:  07/25/15               Reason for consult:  Facility Placement                Permission sought to share information with:  Chartered certified accountant granted to share information::  Yes, Verbal Permission Granted  Name::        Agency::     Relationship::     Contact Information:     Housing/Transportation Living arrangements for the past 2 months:  Single Family Home Source of Information:  Patient, Adult Children Patient Interpreter Needed:  None Criminal Activity/Legal Involvement Pertinent to Current Situation/Hospitalization:  No - Comment as needed Significant Relationships:  None Lives with:  Self Do you feel safe going back to the place where you live?  No Need for family participation in patient care:  Yes (Comment)  Care giving concerns:  CSW reviewed PT evaluation recommending SNF at discharge.    Social Worker assessment / plan:  CSW spoke with patient & son, [name] at bedside re: discharge planning/SNF.   Employment status:  Retired Forensic scientist:  [employer] PT Recommendations:  [place] / Referral to community resources:  [place]  Patient/Family's Response to care:  Patient & son are agreeable with plan for SNF - patient's sister had been to a facility in the past and would like to talk with her & other friends to determine which SNF she goes to.   Patient/Family's Understanding of and Emotional Response to Diagnosis, Current Treatment, and Prognosis:  Patient is concerned about being on oxygen, patient had removed the oxygen tubing from her nose and her O2 sats dropped from 95 to 91. Patient states that she was not on O2 at home and hopes to be able to get rid of it before returning home.   Emotional Assessment Appearance:  Appears stated  age Attitude/Demeanor/Rapport:    Affect (typically observed):  Happy, Calm Orientation:  Oriented to Self, Oriented to Place, Oriented to Situation, Oriented to  Time Alcohol / Substance use:    Psych involvement (Current and /or in the community):     Discharge Needs  Concerns to be addressed:    Readmission within the last 30 days:    Current discharge risk:    Barriers to Discharge:      Standley Brooking, LCSW 07/25/2015, 3:04 PM

## 2015-07-25 NOTE — Care Management Important Message (Signed)
Important Message  Patient Details  Name: ABBEYGAIL IGOE MRN: [id number] Date of Birth: August 25, 1935   Medicare Important Message Given:  Yes-second notification given    Camillo Flaming 07/25/2015, 1:25 PMImportant Message  Patient Details  Name: CARLISLE TORGESON MRN: [id number] Date of Birth: [birth year]   Medicare Important Message Given:  Yes-second notification given    Camillo Flaming 07/25/2015, 1:25 PM

## 2015-07-25 NOTE — Discharge Instructions (Signed)
Acute Respiratory Failure °Respiratory failure is when your lungs are not working well and your breathing (respiratory) system fails. When respiratory failure occurs, it is difficult for your lungs to get enough oxygen, get rid of carbon dioxide, or both. Respiratory failure can be life threatening.  °Respiratory failure can be acute or chronic. Acute respiratory failure is sudden, severe, and requires emergency medical treatment. Chronic respiratory failure is less severe, happens over time, and requires ongoing treatment.  °WHAT ARE THE CAUSES OF ACUTE RESPIRATORY FAILURE?  °Any problem affecting the heart or lungs can cause acute respiratory failure. Some of these causes include the following: °· Chronic bronchitis and emphysema (COPD).   °· Blood clot going to a lung (pulmonary embolism).   °· Having water in the lungs caused by heart failure, lung injury, or infection (pulmonary edema).   °· Collapsed lung (pneumothorax).   °· Pneumonia.   °· Pulmonary fibrosis.   °· Obesity.   °· Asthma.   °· Heart failure.   °· Any type of trauma to the chest that can make breathing difficult.   °· Nerve or muscle diseases making chest movements difficult. °WHAT SYMPTOMS SHOULD YOU WATCH FOR?  °If you have any of these signs or symptoms, you should seek immediate medical care:  °· You have shortness of breath (dyspnea) with or without activity.   °· You have rapid, fast breathing (tachypnea).   °· You are wheezing. °· You are unable to say more than a few words without having to catch your breath. °· You find it very difficult to function normally. °· You have a fast heart rate.   °· You have a bluish color to your finger or toe nail beds.   °· You have confusion or drowsiness or both.   °HOW WILL MY ACUTE RESPIRATORY FAILURE BE TREATED?  °Treatment of acute respiratory failure depends on the cause of the respiratory failure. Usually, you will stay in the intensive care unit so your breathing can be watched closely. Treatment  can include the following: °· Oxygen. Oxygen can be delivered through the following: °· Nasal cannula. This is small tubing that goes in your nose to give you oxygen. °· Face mask. A face mask covers your nose and mouth to give you oxygen. °· Medicine. Different medicines can be given to help with breathing. These can include: °· Nebulizers. Nebulizers deliver medicines to open the air passages (bronchodilators). These medicines help to open or relax the airways in the lungs so you can breathe better. They can also help loosen mucus from your lungs. °· Diuretics. Diuretic medicines can help you breathe better by getting rid of extra water in your body. °· Steroids. Steroid medicines can help decrease swelling (inflammation) in your lungs. °· Antibiotics. °· Chest tube. If you have a collapsed lung (pneumothorax), a chest tube is placed to help reinflate the lung. °· Non-invasive positive pressure ventilation (NPPV). This is a tight-fitting mask that goes over your nose and mouth. The mask has tubing that is attached to a machine. The machine blows air into the tubing, which helps to keep the tiny air sacs (alveoli) in your lungs open. This machine allows you to breathe on your own. °· Ventilator. A ventilator is a breathing machine. When on a ventilator, a breathing tube is put into the lungs. A ventilator is used when you can no longer breathe well enough on your own. You may have low oxygen levels or high carbon dioxide (CO2) levels in your blood. When you are on a ventilator, sedation and pain medicines are given to make you sleep   so your lungs can heal. Document Released: 11/29/2013 Document Revised: 04/10/2014 Document Reviewed: 11/29/2013 [facility] Patient Information 2015 [place], [place]. This information is not intended to replace advice given to you by your health care provider. Make sure you discuss any questions you have with your health care provider.  Chronic Respiratory Failure Respiratory failure  is when your lungs are not working well and your breathing (respiratory) system fails. When respiratory failure occurs, it is difficult for your lungs to get enough oxygen or get rid of carbon dioxide or both. Respiratory failure can be life threatening.  Respiratory failure can be acute or chronic. Acute respiratory failure is sudden, severe, and requires emergency medical treatment. Chronic respiratory failure is less severe, happens over time, and requires ongoing treatment.  CAUSES  Any problem affecting the heart or lungs can cause respiratory failure. Some of these causes may be:  Chronic bronchitis and emphysema (COPD).  Blood clot going to the lung (pulmonary embolism).  Having water in the lungs caused by heart failure, lung injury, or infection (pulmonary edema).  Collapsed lung (pneumothorax).  Pneumonia.  Pulmonary fibrosis.  Obesity.  Asthma.  Heart failure.  Any type of trauma to the chest that can make breathing difficult.  Nerve or muscle diseases making chest movements difficult. SYMPTOMS  Signs and symptoms of chronic respiratory failure include:  Shortness of breath (dyspnea) with or without activity.  Rapid, fast breathing (tachypnea).  Wheezing.  Fast heart rate.  Bluish color to the fingernail or toenail beds.  Confusion or drowsiness or both. DIAGNOSIS  Initial diagnosis requires a thorough history and a physical exam by your health care provider. Additional tests may include:  Chest X-ray.  CT scan of your lungs.  Ultrasound to check for blood clots.  Blood tests, such as an arterial blood gas test (ABG). This is a blood test that looks at the oxygen and carbon dioxide levels in your arterial blood.  Your vital signs will be taken. This includes your respiratory rate (how many times a minute you are breathing), oxygen saturation (this measures the oxygen level in your blood), heart rate, and blood pressure. These numbers help your health care  provider determine the next steps.  Electrocardiogram. TREATMENT  Treatment of chronic respiratory failure depends on the cause of the respiratory failure. Treatment can include the following:  Oxygen. Oxygen can be delivered through the following:  Nasal cannula. This is small tubing that goes in your nose to give you oxygen.  Face mask. A face mask covers your nose and mouth to give you oxygen.  Medicine. Different medicines can be given to help with breathing. These can include:  Nebulizers. Nebulizers deliver medicines to open the air passages (bronchodilators). These medicines help to open or relax the airways in the lungs so you can breathe better. They can also help loosen mucus from your lungs.  Diuretics. Diuretic medicines can help you breathe better by getting rid of extra fluid in your body.  Steroids. Steroid medicines can help decrease inflammation in your lungs.  Chest tube. If you have a collapsed lung (pneumothorax), a chest tube is placed to help reinflate the lung.  Non-invasive positive pressure ventilation (NPPV). This is a tight-fitting mask that goes over your nose and mouth. The mask has tubing that is attached to a machine. The machine blows air into the tubing, which helps to keep the tiny air sacs (alveoli) in your lungs open. This machine allows you to breathe on your own.  Ventilator. A ventilator  is a breathing machine. When on a ventilator, a breathing tube is put into the lungs. A ventilator is used when you can no longer breathe well enough on your own. You may have low oxygen levels or high carbon dioxide (CO2) levels in your blood. When you are on a ventilator, sedation and pain medicines are given to make you sleep so your lungs can heal. HOME CARE INSTRUCTIONS  Follow your health care provider's directions about medicines and respiratory therapy.  Quit smoking if you smoke. SEEK MEDICAL CARE IF:  You have increasing shortness of breath and are less  functional than you have been.  You have increased sputum, wheezing, coughing, or loss of energy.  You are on oxygen and are requiring more. SEEK IMMEDIATE MEDICAL CARE IF:  Your shortness of breath is significantly worse.  You are unable to say more than a few words without having to catch your breath.  You are much less functional. MAKE SURE YOU:  Understand these instructions.  Will watch your condition.  Will get help right away if you are not doing well or get worse. Document Released: 11/24/2005 Document Revised: 04/10/2014 Document Reviewed: 09/22/2013 [facility] Patient Information 2015 [place], [place]. This information is not intended to replace advice given to you by your health care provider. Make sure you discuss any questions you have with your health care provider.

## 2015-07-26 DIAGNOSIS — G894 Chronic pain syndrome: Secondary | ICD-10-CM | POA: Diagnosis not present

## 2015-07-26 DIAGNOSIS — R262 Difficulty in walking, not elsewhere classified: Secondary | ICD-10-CM | POA: Diagnosis not present

## 2015-07-26 DIAGNOSIS — M6281 Muscle weakness (generalized): Secondary | ICD-10-CM | POA: Diagnosis not present

## 2015-07-26 DIAGNOSIS — R2689 Other abnormalities of gait and mobility: Secondary | ICD-10-CM | POA: Diagnosis not present

## 2015-07-26 DIAGNOSIS — M797 Fibromyalgia: Secondary | ICD-10-CM | POA: Diagnosis not present

## 2015-07-26 DIAGNOSIS — I10 Essential (primary) hypertension: Secondary | ICD-10-CM | POA: Diagnosis not present

## 2015-07-26 DIAGNOSIS — J209 Acute bronchitis, unspecified: Secondary | ICD-10-CM

## 2015-07-26 DIAGNOSIS — J80 Acute respiratory distress syndrome: Secondary | ICD-10-CM | POA: Diagnosis not present

## 2015-07-26 DIAGNOSIS — R1310 Dysphagia, unspecified: Secondary | ICD-10-CM | POA: Diagnosis not present

## 2015-07-26 DIAGNOSIS — E039 Hypothyroidism, unspecified: Secondary | ICD-10-CM | POA: Diagnosis not present

## 2015-07-26 DIAGNOSIS — R531 Weakness: Secondary | ICD-10-CM | POA: Diagnosis not present

## 2015-07-26 DIAGNOSIS — E785 Hyperlipidemia, unspecified: Secondary | ICD-10-CM | POA: Diagnosis not present

## 2015-07-26 DIAGNOSIS — Z760 Encounter for issue of repeat prescription: Secondary | ICD-10-CM | POA: Diagnosis not present

## 2015-07-26 DIAGNOSIS — J449 Chronic obstructive pulmonary disease, unspecified: Secondary | ICD-10-CM | POA: Diagnosis not present

## 2015-07-26 DIAGNOSIS — I2510 Atherosclerotic heart disease of native coronary artery without angina pectoris: Secondary | ICD-10-CM | POA: Diagnosis not present

## 2015-07-26 DIAGNOSIS — M6250 Muscle wasting and atrophy, not elsewhere classified, unspecified site: Secondary | ICD-10-CM | POA: Diagnosis not present

## 2015-07-26 DIAGNOSIS — J9801 Acute bronchospasm: Secondary | ICD-10-CM | POA: Diagnosis not present

## 2015-07-26 DIAGNOSIS — J9601 Acute respiratory failure with hypoxia: Secondary | ICD-10-CM | POA: Diagnosis not present

## 2015-07-26 LAB — CBC
HCT: 37.2 % (ref 36.0–46.0)
Hemoglobin: 11.5 g/dL — ABNORMAL LOW (ref 12.0–15.0)
MCH: 28.8 pg (ref 26.0–34.0)
MCHC: 30.9 g/dL (ref 30.0–36.0)
MCV: 93.2 fL (ref 78.0–100.0)
Platelets: 314 10*3/uL (ref 150–400)
RBC: 3.99 MIL/uL (ref 3.87–5.11)
RDW: 13.6 % (ref 11.5–15.5)
WBC: 11 10*3/uL — ABNORMAL HIGH (ref 4.0–10.5)

## 2015-07-26 LAB — BASIC METABOLIC PANEL
Anion gap: 6 (ref 5–15)
BUN: 20 mg/dL (ref 6–20)
CO2: 33 mmol/L — ABNORMAL HIGH (ref 22–32)
Calcium: 9.6 mg/dL (ref 8.9–10.3)
Chloride: 101 mmol/L (ref 101–111)
Creatinine, Ser: 0.84 mg/dL (ref 0.44–1.00)
GFR calc Af Amer: >60 mL/min (ref >60)
GFR calc non Af Amer: >60 mL/min (ref >60)
Glucose, Bld: 111 mg/dL — ABNORMAL HIGH (ref 65–99)
Potassium: 4.6 mmol/L (ref 3.5–5.1)
Sodium: 140 mmol/L (ref 135–145)

## 2015-07-26 NOTE — Progress Notes (Signed)
Discharged to SNF via stretcher w/ [facility].Voices no C/O.

## 2015-07-26 NOTE — Progress Notes (Signed)
Patient seen and examined at the bedside. Please refer to discharge summary completed 07/25/2015. Patient is medically stable for discharge to skilled nursing facility. No new changes in medical management since 07/25/2015.  Leisa Lenz [facility] [phone]

## 2015-07-26 NOTE — Progress Notes (Signed)
Report called to SN[facility].Spoke to [employer]

## 2015-07-26 NOTE — Care Management Note (Signed)
Case Management Note  Patient Details  Name: Heather [name] MRN: [id number] Date of Birth: 06-Aug-1935  Subjective/Objective:                   Shortness of breath and wheezing Action/Plan: Discharge planning  Expected Discharge Date:  07/26/15               Expected Discharge Plan:  [place]  In-House Referral:     Discharge planning Services  CM Consult  Post Acute Care Choice:    Choice offered to:     DME Arranged:    DME Agency:     HH Arranged:    [place] [address] Agency:     Status of Service:  Completed, signed off  Medicare Important Message Given:  Yes-second notification given Date Medicare IM Given:    Medicare IM give by:    Date Additional Medicare IM Given:    Additional Medicare Important Message give by:     If discussed at [place] of Stay Meetings, dates discussed:    Additional Comments: CM notes pt to go to SNF; CSW arranging.  No other CM needs were communicated. Dellie Catholic, RN 07/26/2015, 12:04 PM

## 2015-07-26 NOTE — Clinical Social Work Placement (Signed)
Patient is set to discharge to [facility] today. Patient & son, [name] aware. Discharge packet given to RN, April. [facility] called for transport.     Raynaldo Opitz, [facility] Clinical Social Worker cell #: [phone]    CLINICAL SOCIAL WORK PLACEMENT  NOTE  Date:  07/26/2015  Patient Details  Name: Heather [name] MRN: [id number] Date of Birth: 01/27/1935  Clinical Social Work is seeking post-discharge placement for this patient at the [place] level of care (*CSW will initial, date and re-position this form in  chart as items are completed):  Yes   Patient/family provided with [place] Work Department's list of facilities offering this level of care within the geographic area requested by the patient (or if unable, by the patient's family).  Yes   Patient/family informed of their freedom to choose among providers that offer the needed level of care, that participate in Medicare, Medicaid or managed care program needed by the patient, have an available bed and are willing to accept the patient.  Yes   Patient/family informed of [facility]'s ownership interest in [facility] and [facility], as well as of the fact that they are under no obligation to receive care at these facilities.  PASRR submitted to EDS on 07/25/15     PASRR number received on 07/25/15     Existing PASRR number confirmed on       FL2 transmitted to all facilities in geographic area requested by pt/family on 07/25/15     FL2 transmitted to all facilities within larger geographic area on       Patient informed that his/her managed care company has contracts with or will negotiate with certain facilities, including the following:        Yes   Patient/family informed of bed offers received.  Patient chooses bed at [facility]     Physician recommends and patient chooses bed at      Patient to be transferred to [facility] on  07/26/15.  Patient to be transferred to facility by [facility]     Patient family notified on 07/26/15 of transfer.  Name of family member notified:  patient's son, [name] via phone     PHYSICIAN       Additional Comment:    _______________________________________________ Standley Brooking, LCSW 07/26/2015, 1:51 PM

## 2015-07-26 NOTE — Progress Notes (Signed)
Pt has flutter valve at bedside that she has been using. Pt knows and understands how to use.

## 2015-07-27 ENCOUNTER — Telehealth: Payer: Self-pay | Admitting: *Deleted

## 2015-07-27 DIAGNOSIS — M6250 Muscle wasting and atrophy, not elsewhere classified, unspecified site: Secondary | ICD-10-CM | POA: Diagnosis not present

## 2015-07-27 DIAGNOSIS — R2689 Other abnormalities of gait and mobility: Secondary | ICD-10-CM | POA: Diagnosis not present

## 2015-07-27 DIAGNOSIS — G894 Chronic pain syndrome: Secondary | ICD-10-CM | POA: Diagnosis not present

## 2015-07-27 DIAGNOSIS — M797 Fibromyalgia: Secondary | ICD-10-CM | POA: Diagnosis not present

## 2015-07-27 DIAGNOSIS — I10 Essential (primary) hypertension: Secondary | ICD-10-CM | POA: Diagnosis not present

## 2015-07-27 DIAGNOSIS — J449 Chronic obstructive pulmonary disease, unspecified: Secondary | ICD-10-CM | POA: Diagnosis not present

## 2015-07-27 DIAGNOSIS — I2510 Atherosclerotic heart disease of native coronary artery without angina pectoris: Secondary | ICD-10-CM | POA: Diagnosis not present

## 2015-07-27 NOTE — Telephone Encounter (Signed)
Attemped to call patient for Transitional Care Management call. Appointment is already scheduled. Will attempt to call again; left voicemail to call back.

## 2015-07-31 DIAGNOSIS — I10 Essential (primary) hypertension: Secondary | ICD-10-CM | POA: Diagnosis not present

## 2015-07-31 DIAGNOSIS — Z760 Encounter for issue of repeat prescription: Secondary | ICD-10-CM | POA: Diagnosis not present

## 2015-07-31 DIAGNOSIS — E039 Hypothyroidism, unspecified: Secondary | ICD-10-CM | POA: Diagnosis not present

## 2015-07-31 DIAGNOSIS — E785 Hyperlipidemia, unspecified: Secondary | ICD-10-CM | POA: Diagnosis not present

## 2015-08-02 ENCOUNTER — Ambulatory Visit: Payer: Medicare Other | Admitting: Internal Medicine

## 2015-08-06 DIAGNOSIS — M6281 Muscle weakness (generalized): Secondary | ICD-10-CM | POA: Diagnosis not present

## 2015-08-06 DIAGNOSIS — R262 Difficulty in walking, not elsewhere classified: Secondary | ICD-10-CM | POA: Diagnosis not present

## 2015-08-09 DIAGNOSIS — R262 Difficulty in walking, not elsewhere classified: Secondary | ICD-10-CM | POA: Diagnosis not present

## 2015-08-09 DIAGNOSIS — J9601 Acute respiratory failure with hypoxia: Secondary | ICD-10-CM | POA: Diagnosis not present

## 2015-08-09 DIAGNOSIS — J9801 Acute bronchospasm: Secondary | ICD-10-CM | POA: Diagnosis not present

## 2015-08-09 DIAGNOSIS — M6281 Muscle weakness (generalized): Secondary | ICD-10-CM | POA: Diagnosis not present

## 2015-08-09 DIAGNOSIS — J209 Acute bronchitis, unspecified: Secondary | ICD-10-CM | POA: Diagnosis not present

## 2015-08-17 DIAGNOSIS — R1310 Dysphagia, unspecified: Secondary | ICD-10-CM | POA: Diagnosis not present

## 2015-08-17 DIAGNOSIS — K219 Gastro-esophageal reflux disease without esophagitis: Secondary | ICD-10-CM | POA: Diagnosis not present

## 2015-08-17 DIAGNOSIS — G8929 Other chronic pain: Secondary | ICD-10-CM | POA: Diagnosis not present

## 2015-08-17 DIAGNOSIS — I10 Essential (primary) hypertension: Secondary | ICD-10-CM | POA: Diagnosis not present

## 2015-08-17 DIAGNOSIS — I2510 Atherosclerotic heart disease of native coronary artery without angina pectoris: Secondary | ICD-10-CM | POA: Diagnosis not present

## 2015-08-17 DIAGNOSIS — J439 Emphysema, unspecified: Secondary | ICD-10-CM | POA: Diagnosis not present

## 2015-08-19 DIAGNOSIS — I10 Essential (primary) hypertension: Secondary | ICD-10-CM | POA: Diagnosis not present

## 2015-08-19 DIAGNOSIS — J439 Emphysema, unspecified: Secondary | ICD-10-CM | POA: Diagnosis not present

## 2015-08-19 DIAGNOSIS — K219 Gastro-esophageal reflux disease without esophagitis: Secondary | ICD-10-CM | POA: Diagnosis not present

## 2015-08-19 DIAGNOSIS — R1310 Dysphagia, unspecified: Secondary | ICD-10-CM | POA: Diagnosis not present

## 2015-08-19 DIAGNOSIS — G8929 Other chronic pain: Secondary | ICD-10-CM | POA: Diagnosis not present

## 2015-08-19 DIAGNOSIS — I2510 Atherosclerotic heart disease of native coronary artery without angina pectoris: Secondary | ICD-10-CM | POA: Diagnosis not present

## 2015-08-20 ENCOUNTER — Telehealth: Payer: Self-pay | Admitting: Internal Medicine

## 2015-08-20 DIAGNOSIS — K219 Gastro-esophageal reflux disease without esophagitis: Secondary | ICD-10-CM | POA: Diagnosis not present

## 2015-08-20 DIAGNOSIS — J439 Emphysema, unspecified: Secondary | ICD-10-CM | POA: Diagnosis not present

## 2015-08-20 DIAGNOSIS — G8929 Other chronic pain: Secondary | ICD-10-CM | POA: Diagnosis not present

## 2015-08-20 DIAGNOSIS — I10 Essential (primary) hypertension: Secondary | ICD-10-CM | POA: Diagnosis not present

## 2015-08-20 DIAGNOSIS — I2510 Atherosclerotic heart disease of native coronary artery without angina pectoris: Secondary | ICD-10-CM | POA: Diagnosis not present

## 2015-08-20 DIAGNOSIS — R1310 Dysphagia, unspecified: Secondary | ICD-10-CM | POA: Diagnosis not present

## 2015-08-20 NOTE — Telephone Encounter (Signed)
Please see message and okay orders.

## 2015-08-20 NOTE — Telephone Encounter (Signed)
Left detailed message on Heather [name]'s personal voicemail, orders given for nursing orders for pt 2 x week for 9 weeks,  Home health aid 3 x week for 4 weeks,  2 x wk / 4 weeks okay per Dr.K. Any questions please call the office.

## 2015-08-20 NOTE — Telephone Encounter (Signed)
ok 

## 2015-08-20 NOTE — Telephone Encounter (Signed)
Debbie,w/  [facility] calling looking for nursing orders for pt  2 x  wk/ 9  Home health aid  3 x wk / 4,  2 x wk / 4  They sent pt home w/ albuterol vial solution , but not nebulizer.  But not sure pt needs. Her lungs are clear, pt just very tired Pt does have an inhaler and Spiriva which is 1 x day  Pt was very sick. Had resp failure.  Debbie made a visit on friday , and sat.  They were worried about pt. Do to her being so weak and her issues. They hope this was ok. Will need ok for this as well.  It was late Friday when they made decision to go. And office was closed.  Ok to [facility] on her secure personlized VM

## 2015-08-21 DIAGNOSIS — I10 Essential (primary) hypertension: Secondary | ICD-10-CM | POA: Diagnosis not present

## 2015-08-21 DIAGNOSIS — K219 Gastro-esophageal reflux disease without esophagitis: Secondary | ICD-10-CM | POA: Diagnosis not present

## 2015-08-21 DIAGNOSIS — R1310 Dysphagia, unspecified: Secondary | ICD-10-CM | POA: Diagnosis not present

## 2015-08-21 DIAGNOSIS — J439 Emphysema, unspecified: Secondary | ICD-10-CM | POA: Diagnosis not present

## 2015-08-21 DIAGNOSIS — I2510 Atherosclerotic heart disease of native coronary artery without angina pectoris: Secondary | ICD-10-CM | POA: Diagnosis not present

## 2015-08-21 DIAGNOSIS — G8929 Other chronic pain: Secondary | ICD-10-CM | POA: Diagnosis not present

## 2015-08-22 ENCOUNTER — Telehealth: Payer: Self-pay | Admitting: Internal Medicine

## 2015-08-22 DIAGNOSIS — K219 Gastro-esophageal reflux disease without esophagitis: Secondary | ICD-10-CM | POA: Diagnosis not present

## 2015-08-22 DIAGNOSIS — R1310 Dysphagia, unspecified: Secondary | ICD-10-CM | POA: Diagnosis not present

## 2015-08-22 DIAGNOSIS — G8929 Other chronic pain: Secondary | ICD-10-CM | POA: Diagnosis not present

## 2015-08-22 DIAGNOSIS — I10 Essential (primary) hypertension: Secondary | ICD-10-CM | POA: Diagnosis not present

## 2015-08-22 DIAGNOSIS — I2510 Atherosclerotic heart disease of native coronary artery without angina pectoris: Secondary | ICD-10-CM | POA: Diagnosis not present

## 2015-08-22 DIAGNOSIS — J439 Emphysema, unspecified: Secondary | ICD-10-CM | POA: Diagnosis not present

## 2015-08-22 NOTE — Telephone Encounter (Signed)
ok 

## 2015-08-22 NOTE — Telephone Encounter (Signed)
Okay for PT for pt?

## 2015-08-22 NOTE — Telephone Encounter (Signed)
Heather [name] is calling to request for pt to having physical therapy twice a wk for 6 wks. Verbal order is ok

## 2015-08-23 DIAGNOSIS — I10 Essential (primary) hypertension: Secondary | ICD-10-CM | POA: Diagnosis not present

## 2015-08-23 DIAGNOSIS — R1310 Dysphagia, unspecified: Secondary | ICD-10-CM | POA: Diagnosis not present

## 2015-08-23 DIAGNOSIS — K219 Gastro-esophageal reflux disease without esophagitis: Secondary | ICD-10-CM | POA: Diagnosis not present

## 2015-08-23 DIAGNOSIS — G8929 Other chronic pain: Secondary | ICD-10-CM | POA: Diagnosis not present

## 2015-08-23 DIAGNOSIS — I2510 Atherosclerotic heart disease of native coronary artery without angina pectoris: Secondary | ICD-10-CM | POA: Diagnosis not present

## 2015-08-23 DIAGNOSIS — J439 Emphysema, unspecified: Secondary | ICD-10-CM | POA: Diagnosis not present

## 2015-08-23 NOTE — Telephone Encounter (Signed)
Spoke to [employer], verbal order given for Physical Therapy twice a week for 6 weeks for pt, okay per Dr.K. Danielle verbalized understanding.

## 2015-08-24 DIAGNOSIS — J439 Emphysema, unspecified: Secondary | ICD-10-CM | POA: Diagnosis not present

## 2015-08-24 DIAGNOSIS — G8929 Other chronic pain: Secondary | ICD-10-CM | POA: Diagnosis not present

## 2015-08-24 DIAGNOSIS — R1310 Dysphagia, unspecified: Secondary | ICD-10-CM | POA: Diagnosis not present

## 2015-08-24 DIAGNOSIS — I2510 Atherosclerotic heart disease of native coronary artery without angina pectoris: Secondary | ICD-10-CM | POA: Diagnosis not present

## 2015-08-24 DIAGNOSIS — I10 Essential (primary) hypertension: Secondary | ICD-10-CM | POA: Diagnosis not present

## 2015-08-24 DIAGNOSIS — K219 Gastro-esophageal reflux disease without esophagitis: Secondary | ICD-10-CM | POA: Diagnosis not present

## 2015-08-27 ENCOUNTER — Telehealth: Payer: Self-pay | Admitting: Internal Medicine

## 2015-08-27 DIAGNOSIS — R1310 Dysphagia, unspecified: Secondary | ICD-10-CM | POA: Diagnosis not present

## 2015-08-27 DIAGNOSIS — I10 Essential (primary) hypertension: Secondary | ICD-10-CM | POA: Diagnosis not present

## 2015-08-27 DIAGNOSIS — K219 Gastro-esophageal reflux disease without esophagitis: Secondary | ICD-10-CM | POA: Diagnosis not present

## 2015-08-27 DIAGNOSIS — J439 Emphysema, unspecified: Secondary | ICD-10-CM | POA: Diagnosis not present

## 2015-08-27 DIAGNOSIS — G8929 Other chronic pain: Secondary | ICD-10-CM | POA: Diagnosis not present

## 2015-08-27 DIAGNOSIS — I2510 Atherosclerotic heart disease of native coronary artery without angina pectoris: Secondary | ICD-10-CM | POA: Diagnosis not present

## 2015-08-27 NOTE — Telephone Encounter (Signed)
Heather [name] with [facility] home health requesting a verbal order for in home OT for 2 times a week for four weeks.

## 2015-08-28 ENCOUNTER — Ambulatory Visit (INDEPENDENT_AMBULATORY_CARE_PROVIDER_SITE_OTHER): Payer: Medicare Other | Admitting: Internal Medicine

## 2015-08-28 ENCOUNTER — Encounter: Payer: Self-pay | Admitting: Internal Medicine

## 2015-08-28 ENCOUNTER — Other Ambulatory Visit: Payer: Self-pay | Admitting: Internal Medicine

## 2015-08-28 VITALS — BP 110/70 | HR 81 | Temp 98.2°F | Resp 20 | Ht 59.0 in | Wt 112.0 lb

## 2015-08-28 DIAGNOSIS — M545 Low back pain: Secondary | ICD-10-CM | POA: Diagnosis not present

## 2015-08-28 DIAGNOSIS — E785 Hyperlipidemia, unspecified: Secondary | ICD-10-CM

## 2015-08-28 DIAGNOSIS — F419 Anxiety disorder, unspecified: Secondary | ICD-10-CM

## 2015-08-28 DIAGNOSIS — I2510 Atherosclerotic heart disease of native coronary artery without angina pectoris: Secondary | ICD-10-CM

## 2015-08-28 DIAGNOSIS — Z23 Encounter for immunization: Secondary | ICD-10-CM

## 2015-08-28 DIAGNOSIS — I10 Essential (primary) hypertension: Secondary | ICD-10-CM | POA: Diagnosis not present

## 2015-08-28 DIAGNOSIS — F418 Other specified anxiety disorders: Secondary | ICD-10-CM

## 2015-08-28 DIAGNOSIS — E039 Hypothyroidism, unspecified: Secondary | ICD-10-CM | POA: Diagnosis not present

## 2015-08-28 DIAGNOSIS — F329 Major depressive disorder, single episode, unspecified: Secondary | ICD-10-CM

## 2015-08-28 DIAGNOSIS — J9601 Acute respiratory failure with hypoxia: Secondary | ICD-10-CM

## 2015-08-28 MED ORDER — ALBUTEROL SULFATE 108 (90 BASE) MCG/ACT IN AEPB: 2.0000 | INHALATION_SPRAY | Freq: Four times a day (QID) | RESPIRATORY_TRACT | Status: DC | PRN

## 2015-08-28 NOTE — Progress Notes (Signed)
Pre visit review using our clinic review tool, if applicable. No additional management support is needed unless otherwise documented below in the visit note. 

## 2015-08-28 NOTE — Telephone Encounter (Signed)
Okay for OT for pt?

## 2015-08-28 NOTE — Patient Instructions (Signed)
Limit your sodium (Salt) intake    It is important that you exercise regularly, at least 20 minutes 3 to 4 times per week.  If you develop chest pain or shortness of breath seek  medical attention.  Return in 6 months for follow-up  

## 2015-08-28 NOTE — Telephone Encounter (Signed)
ok 

## 2015-08-28 NOTE — Progress Notes (Signed)
Subjective:    Patient ID: Heather [name], female    DOB: 1935-12-03, 80 y.o.   MRN: [id number]  HPI Admit date: 07/23/2015 Discharge date: 07/25/2015  Recommendations for Outpatient Follow-up:  1. Taper down prednisone starting from 50 mg a day, taper down by 5 mg a day down to 0 mg. for ex, today 50 mg, tomorrow 45 mg, then 40 mg the following day and etc... 2. Please avoid multiple sleeping agents. Trazodone continued.  Discharge Diagnoses:  Principal Problem:  Acute respiratory failure with hypoxia Active Problems:  Hypothyroidism  Anxiety and depression  Essential hypertension  Bronchospasm with bronchitis, acute  Coronary atherosclerosis  Atherosclerosis of aorta  Dysphagia  80 year old patient without prior chronic lung disease.  She was admitted to the hospital about one month ago for acute bronchospasm with hypoxemia.  She was discharged on a prednisone taper to rehabilitation where she completed 20 days.  She continues to have in-home rehabilitation and continues to get stronger, but still complaining of some weakness.  No pulmonary complaints Treatment was, gated by thrush She has essential hypertension, anxiety, dyslipidemia. Denies any wheezing.  She was discharged on Spiriva  On complaint today.  Her weakness and some constipation issues.  She has been treated with MiraLAX  Past Medical History  Diagnosis Date  . ANXIETY 07/23/2009  . DEPRESSION 07/23/2009  . DIVERTICULOSIS, COLON 07/23/2009  . GERD 07/23/2009  . HIP PAIN, LEFT 05/16/2010  . HYPERLIPIDEMIA 07/23/2009  . HYPERTENSION 07/23/2009  . HYPOTHYROIDISM 07/23/2009  . LOW BACK PAIN, CHRONIC 10/01/2009  . PARESTHESIA 10/01/2009  . IBS (irritable bowel syndrome)   . History of Crohn's disease   . Chronic pain   . Hypothyroidism   . Insomnia   . Headache(784.0) 07/23/2009    occasional  . Shingles 2006    back  . Hemorrhoids   . Complication of anesthesia 7 or 8 yrs ago    woke up during  colonscopy  . Atherosclerosis of aorta 07/24/2015  . Coronary atherosclerosis 07/24/2015    Social History   Social History  . Marital Status: Widowed    Spouse Name: N/A  . Number of Children: 1  . Years of Education: N/A   Occupational History  . Not on file.   Social History Main Topics  . Smoking status: Never Smoker   . Smokeless tobacco: Never Used  . Alcohol Use: No  . Drug Use: No  . Sexual Activity: Not on file   Other Topics Concern  . Not on file   Social History Narrative   Widowed.  Lives alone in her own home.  Ambulates with a cane/walker when needed.    Past Surgical History  Procedure Laterality Date  . Tonsillectomy  yrs ago  . Hemorrhoid surgery  yrs ago  . Appendectomy  yrs ago  . Bilateral salpingoophorectomy  age 16 or 75  . Abdominal hysterectomy  age 71 or 72  . Colonscopy  7 or 8 yrs ago  . Esophagogastroduodenoscopy N/A 11/17/2013    Procedure: ESOPHAGOGASTRODUODENOSCOPY (EGD);  Surgeon: Cleotis Nipper, MD;  Location: [facility];  Service: Endoscopy;  Laterality: N/A;  . Colonoscopy N/A 11/17/2013    Procedure: COLONOSCOPY;  Surgeon: Cleotis Nipper, MD;  Location: [facility];  Service: Endoscopy;  Laterality: N/A;    Family History  Problem Relation Age of Onset  . Clotting disorder Neg Hx   . COPD Sister   . Cancer Sister     Breast  . Cancer Mother  Unknown type    Allergies  Allergen Reactions  . Symbicort [Budesonide-Formoterol Fumarate] Other (See Comments)    Pt felt like her tongue was swollen, could not swallow  . Duloxetine Other (See Comments)    Patient doesn't recall  . Metronidazole Other (See Comments)    Patient doesn't recall   . Rofecoxib Other (See Comments)    Patient doesn't recall     Current Outpatient Prescriptions on File Prior to Visit  Medication Sig Dispense Refill  . acetaminophen (TYLENOL) 325 MG tablet Take 2 tablets (650 mg total) by mouth every 6 (six) hours as needed for mild pain  (or Fever >/= 101). 30 tablet 0  . alum & mag hydroxide-simeth (MAALOX/MYLANTA) 200-200-20 MG/5ML suspension Take 30 mLs by mouth every 6 (six) hours as needed for indigestion or heartburn (dyspepsia). 355 mL 0  . Cholecalciferol 2000 UNITS TABS Take 2,000 Units by mouth daily.    . clorazepate (TRANXENE) 7.5 MG tablet TAKE (1) TABLET TWICE DAILY AS NEEDED FOR ANXIETY. 180 tablet 2  . Dextromethorphan-Guaifenesin (MUCINEX DM PO) Take 1 tablet by mouth every 12 (twelve) hours as needed (for cold).     [name] diltiazem (CARDIZEM CD) 240 MG 24 hr capsule TAKE (1) CAPSULE DAILY. 90 capsule 0  . feeding supplement, ENSURE ENLIVE, (ENSURE ENLIVE) LIQD Take 237 mLs by mouth 2 (two) times daily between meals. 237 mL 12  . levothyroxine (SYNTHROID, LEVOTHROID) 25 MCG tablet TAKE 1 TABLET DAILY BEFORE BREAKFAST. 90 tablet 0  . NEXIUM 40 MG capsule TAKE 1 CAPSULE DAILY BEFORE BREAKFAST. 90 capsule 1  . Polyethyl Glycol-Propyl Glycol (SYSTANE OP) Place 1 drop into both eyes 2 (two) times daily as needed (for dry eyes).     . promethazine (PHENERGAN) 25 MG tablet TAKE 1 TABLET EVERY 8 HOURS AS NEEDED FOR NAUSEA. 20 tablet 0  . sertraline (ZOLOFT) 25 MG tablet TAKE 1 TABLET EACH DAY. 90 tablet 1  . traZODone (DESYREL) 50 MG tablet TAKE 1/2 TO 1 TABLET AT BEDTIME AS NEEDED FOR REST. 60 tablet 2   No current facility-administered medications on file prior to visit.    BP 110/70 mmHg  Pulse 81  Temp(Src) 98.2 F (36.8 C) (Oral)  Resp 20  Ht '4\' 11"'$  (1.499 m)  Wt 112 lb (50.803 kg)  BMI 22.61 kg/m2  SpO2 96%     Review of Systems  Constitutional: Positive for fatigue.  HENT: Negative for congestion, dental problem, hearing loss, rhinorrhea, sinus pressure, sore throat and tinnitus.   Eyes: Negative for pain, discharge and visual disturbance.  Respiratory: Negative for cough and shortness of breath.   Cardiovascular: Negative for chest pain, palpitations and leg swelling.  Gastrointestinal: Positive for  constipation. Negative for nausea, vomiting, abdominal pain, diarrhea, blood in stool and abdominal distention.  Genitourinary: Negative for dysuria, urgency, frequency, hematuria, flank pain, vaginal bleeding, vaginal discharge, difficulty urinating, vaginal pain and pelvic pain.  Musculoskeletal: Negative for joint swelling, arthralgias and gait problem.  Skin: Negative for rash.  Neurological: Positive for weakness. Negative for dizziness, syncope, speech difficulty, numbness and headaches.  Hematological: Negative for adenopathy.  Psychiatric/Behavioral: Negative for behavioral problems, dysphoric mood and agitation. The patient is not nervous/anxious.        Objective:   Physical Exam  Constitutional: She is oriented to person, place, and time. She appears well-developed and well-nourished.  HENT:  Head: Normocephalic.  Right Ear: External ear normal.  Left Ear: External ear normal.  Mouth/Throat: Oropharynx is clear and moist.  Eyes: Conjunctivae and EOM are normal. Pupils are equal, round, and reactive to light.  Neck: Normal range of motion. Neck supple. No thyromegaly present.  Cardiovascular: Normal rate, regular rhythm, normal heart sounds and intact distal pulses.   Pulmonary/Chest: Effort normal and breath sounds normal. No respiratory distress. She has no wheezes.  Abdominal: Soft. Bowel sounds are normal. She exhibits no mass. There is no tenderness.  Musculoskeletal: Normal range of motion.  Lymphadenopathy:    She has no cervical adenopathy.  Neurological: She is alert and oriented to person, place, and time.  Skin: Skin is warm and dry. No rash noted.  Psychiatric: She has a normal mood and affect. Her behavior is normal.          Assessment & Plan:   Status post acute bronchitis with bronchospasm. Mild deconditioning.  Will continue in-home rehabilitation Anxiety and depression Hypothyroidism  Will give a prescription for when necessary albuterol use.   Will discontinue maintenance medications Return in 6 months for follow-up Continue rehabilitation

## 2015-08-28 NOTE — Telephone Encounter (Signed)
Spoke to [place] with [facility] verbal order given for in home OT for 2 times a week for four weeks for pt okay per Dr. Shan Levans verbalized understanding.

## 2015-08-29 DIAGNOSIS — R1310 Dysphagia, unspecified: Secondary | ICD-10-CM | POA: Diagnosis not present

## 2015-08-29 DIAGNOSIS — I2510 Atherosclerotic heart disease of native coronary artery without angina pectoris: Secondary | ICD-10-CM | POA: Diagnosis not present

## 2015-08-29 DIAGNOSIS — I10 Essential (primary) hypertension: Secondary | ICD-10-CM | POA: Diagnosis not present

## 2015-08-29 DIAGNOSIS — G8929 Other chronic pain: Secondary | ICD-10-CM | POA: Diagnosis not present

## 2015-08-29 DIAGNOSIS — K219 Gastro-esophageal reflux disease without esophagitis: Secondary | ICD-10-CM | POA: Diagnosis not present

## 2015-08-29 DIAGNOSIS — J439 Emphysema, unspecified: Secondary | ICD-10-CM | POA: Diagnosis not present

## 2015-08-30 DIAGNOSIS — G8929 Other chronic pain: Secondary | ICD-10-CM | POA: Diagnosis not present

## 2015-08-30 DIAGNOSIS — I2510 Atherosclerotic heart disease of native coronary artery without angina pectoris: Secondary | ICD-10-CM | POA: Diagnosis not present

## 2015-08-30 DIAGNOSIS — I10 Essential (primary) hypertension: Secondary | ICD-10-CM | POA: Diagnosis not present

## 2015-08-30 DIAGNOSIS — R1310 Dysphagia, unspecified: Secondary | ICD-10-CM | POA: Diagnosis not present

## 2015-08-30 DIAGNOSIS — J439 Emphysema, unspecified: Secondary | ICD-10-CM | POA: Diagnosis not present

## 2015-08-30 DIAGNOSIS — K219 Gastro-esophageal reflux disease without esophagitis: Secondary | ICD-10-CM | POA: Diagnosis not present

## 2015-08-31 DIAGNOSIS — G8929 Other chronic pain: Secondary | ICD-10-CM | POA: Diagnosis not present

## 2015-08-31 DIAGNOSIS — K219 Gastro-esophageal reflux disease without esophagitis: Secondary | ICD-10-CM | POA: Diagnosis not present

## 2015-08-31 DIAGNOSIS — I10 Essential (primary) hypertension: Secondary | ICD-10-CM | POA: Diagnosis not present

## 2015-08-31 DIAGNOSIS — I2510 Atherosclerotic heart disease of native coronary artery without angina pectoris: Secondary | ICD-10-CM | POA: Diagnosis not present

## 2015-08-31 DIAGNOSIS — J439 Emphysema, unspecified: Secondary | ICD-10-CM | POA: Diagnosis not present

## 2015-08-31 DIAGNOSIS — R1310 Dysphagia, unspecified: Secondary | ICD-10-CM | POA: Diagnosis not present

## 2015-09-03 ENCOUNTER — Telehealth: Payer: Self-pay | Admitting: Internal Medicine

## 2015-09-03 DIAGNOSIS — R1310 Dysphagia, unspecified: Secondary | ICD-10-CM | POA: Diagnosis not present

## 2015-09-03 DIAGNOSIS — I2510 Atherosclerotic heart disease of native coronary artery without angina pectoris: Secondary | ICD-10-CM | POA: Diagnosis not present

## 2015-09-03 DIAGNOSIS — J439 Emphysema, unspecified: Secondary | ICD-10-CM | POA: Diagnosis not present

## 2015-09-03 DIAGNOSIS — I10 Essential (primary) hypertension: Secondary | ICD-10-CM | POA: Diagnosis not present

## 2015-09-03 DIAGNOSIS — G8929 Other chronic pain: Secondary | ICD-10-CM | POA: Diagnosis not present

## 2015-09-03 DIAGNOSIS — K219 Gastro-esophageal reflux disease without esophagitis: Secondary | ICD-10-CM | POA: Diagnosis not present

## 2015-09-03 NOTE — Telephone Encounter (Signed)
Heather [name] from [place] saw patient today and is calling to report pt has thick yellow sputum she is coughing up. Per carol pt has wheezing expiration in lungs except upper left lung. Please advise

## 2015-09-03 NOTE — Telephone Encounter (Signed)
Spoke to pt, asked her how she is? Pt said she not feeling well, little SOB. Asked pt if taking Mucinex DM? Pt said no. Told pt Dr.K wants you to use your inhaler every 4-6 hours and see you tomorrow. Pt said okay. Asked pt if she has any cough syrup? Pt said yes, Delsym. Told pt okay take some of that as directed on the bottle. Pt verbalized understanding. Asked pt if she wants to call son to schedule appt tomorrow. Pt said yes. Told pt okay.   Called and spoke to [place] regarding pt and told him Dr.K would like to see her tomorrow and I have an opening at 11:00 AM is that time okay. [name] said yes, that would be fine I will call her and let her know. Told him okay. Appt scheduled.

## 2015-09-03 NOTE — Telephone Encounter (Signed)
Take over-the-counter expectorants and cough medications such as  Mucinex DM.  Continue albuterol treatments every 4-6 hours. Office visit tomorrow or ED referral if any clinical worsening

## 2015-09-03 NOTE — Telephone Encounter (Signed)
Please see message and advise 

## 2015-09-04 ENCOUNTER — Encounter: Payer: Self-pay | Admitting: Internal Medicine

## 2015-09-04 ENCOUNTER — Ambulatory Visit (INDEPENDENT_AMBULATORY_CARE_PROVIDER_SITE_OTHER): Payer: Medicare Other | Admitting: Internal Medicine

## 2015-09-04 VITALS — BP 130/70 | HR 74 | Temp 98.3°F | Resp 22 | Ht 59.0 in | Wt 112.0 lb

## 2015-09-04 DIAGNOSIS — I2510 Atherosclerotic heart disease of native coronary artery without angina pectoris: Secondary | ICD-10-CM

## 2015-09-04 DIAGNOSIS — J9601 Acute respiratory failure with hypoxia: Secondary | ICD-10-CM

## 2015-09-04 DIAGNOSIS — J209 Acute bronchitis, unspecified: Secondary | ICD-10-CM | POA: Diagnosis not present

## 2015-09-04 MED ORDER — TIOTROPIUM BROMIDE MONOHYDRATE 18 MCG IN CAPS: 18.0000 ug | ORAL_CAPSULE | Freq: Every day | RESPIRATORY_TRACT | Status: DC

## 2015-09-04 NOTE — Progress Notes (Signed)
Pre visit review using our clinic review tool, if applicable. No additional management support is needed unless otherwise documented below in the visit note. 

## 2015-09-04 NOTE — Progress Notes (Signed)
Subjective:    Patient ID: Heather [name], female    DOB: [birth year], 80 y.o.   MRN: [id number]  HPI 80 year old patient who was seen 1 week ago following a recent hospital admission for acute bronchitis with respiratory insufficiency with hypoxia and bronchospasm.  She had done well following a prednisone taper.  She was given a prescription for when necessary albuterol. She was stable to yesterday when she had the onset of cough and wheezing.  She has been using albuterol with resolution of her wheezing.  Today she feels well.  She has not done well on Symbicort in the past  Past Medical History  Diagnosis Date  . ANXIETY 07/23/2009  . DEPRESSION 07/23/2009  . DIVERTICULOSIS, COLON 07/23/2009  . GERD 07/23/2009  . HIP PAIN, LEFT 05/16/2010  . HYPERLIPIDEMIA 07/23/2009  . HYPERTENSION 07/23/2009  . HYPOTHYROIDISM 07/23/2009  . LOW BACK PAIN, CHRONIC 10/01/2009  . PARESTHESIA 10/01/2009  . IBS (irritable bowel syndrome)   . History of Crohn's disease   . Chronic pain   . Hypothyroidism   . Insomnia   . Headache(784.0) 07/23/2009    occasional  . Shingles 2006    back  . Hemorrhoids   . Complication of anesthesia 7 or 8 yrs ago    woke up during colonscopy  . Atherosclerosis of aorta 07/24/2015  . Coronary atherosclerosis 07/24/2015    Social History   Social History  . Marital Status: Widowed    Spouse Name: N/A  . Number of Children: 1  . Years of Education: N/A   Occupational History  . Not on file.   Social History Main Topics  . Smoking status: Never Smoker   . Smokeless tobacco: Never Used  . Alcohol Use: No  . Drug Use: No  . Sexual Activity: Not on file   Other Topics Concern  . Not on file   Social History Narrative   Widowed.  Lives alone in her own home.  Ambulates with a cane/walker when needed.    Past Surgical History  Procedure Laterality Date  . Tonsillectomy  yrs ago  . Hemorrhoid surgery  yrs ago  . Appendectomy  yrs ago  . Bilateral  salpingoophorectomy  age 75 or 89  . Abdominal hysterectomy  age 12 or 18  . Colonscopy  7 or 8 yrs ago  . Esophagogastroduodenoscopy N/A 11/17/2013    Procedure: ESOPHAGOGASTRODUODENOSCOPY (EGD);  Surgeon: Cleotis Nipper, MD;  Location: [facility];  Service: Endoscopy;  Laterality: N/A;  . Colonoscopy N/A 11/17/2013    Procedure: COLONOSCOPY;  Surgeon: Cleotis Nipper, MD;  Location: [facility];  Service: Endoscopy;  Laterality: N/A;    Family History  Problem Relation Age of Onset  . Clotting disorder Neg Hx   . COPD Sister   . Cancer Sister     Breast  . Cancer Mother     Unknown type    Allergies  Allergen Reactions  . Symbicort [Budesonide-Formoterol Fumarate] Other (See Comments)    Pt felt like her tongue was swollen, could not swallow  . Duloxetine Other (See Comments)    Patient doesn't recall  . Metronidazole Other (See Comments)    Patient doesn't recall   . Rofecoxib Other (See Comments)    Patient doesn't recall     Current Outpatient Prescriptions on File Prior to Visit  Medication Sig Dispense Refill  . acetaminophen (TYLENOL) 325 MG tablet Take 2 tablets (650 mg total) by mouth every 6 (six) hours as needed for  mild pain (or Fever >/= 101). 30 tablet 0  . Albuterol Sulfate (PROAIR RESPICLICK) 161 (90 BASE) MCG/ACT AEPB Inhale 2 puffs into the lungs every 6 (six) hours as needed. 1 each 1  . alum & mag hydroxide-simeth (MAALOX/MYLANTA) 200-200-20 MG/5ML suspension Take 30 mLs by mouth every 6 (six) hours as needed for indigestion or heartburn (dyspepsia). 355 mL 0  . Cholecalciferol 2000 UNITS TABS Take 2,000 Units by mouth daily.    . clorazepate (TRANXENE) 7.5 MG tablet TAKE (1) TABLET TWICE DAILY AS NEEDED FOR ANXIETY. 180 tablet 2  . Dextromethorphan-Guaifenesin (MUCINEX DM PO) Take 1 tablet by mouth every 12 (twelve) hours as needed (for cold).     [name] diltiazem (CARDIZEM CD) 240 MG 24 hr capsule TAKE (1) CAPSULE DAILY. 90 capsule 0  . feeding  supplement, ENSURE ENLIVE, (ENSURE ENLIVE) LIQD Take 237 mLs by mouth 2 (two) times daily between meals. 237 mL 12  . levothyroxine (SYNTHROID, LEVOTHROID) 25 MCG tablet TAKE 1 TABLET DAILY BEFORE BREAKFAST. 90 tablet 1  . NEXIUM 40 MG capsule TAKE 1 CAPSULE DAILY BEFORE BREAKFAST. 90 capsule 1  . Polyethyl Glycol-Propyl Glycol (SYSTANE OP) Place 1 drop into both eyes 2 (two) times daily as needed (for dry eyes).     . promethazine (PHENERGAN) 25 MG tablet TAKE 1 TABLET EVERY 8 HOURS AS NEEDED FOR NAUSEA. 20 tablet 0  . sertraline (ZOLOFT) 25 MG tablet TAKE 1 TABLET EACH DAY. 90 tablet 1  . traMADol (ULTRAM) 50 MG tablet TAKE 1 TABLET EVERY SIX HOURS AS NEEDED FOR PAIN. 60 tablet 2  . traZODone (DESYREL) 50 MG tablet TAKE 1/2 TO 1 TABLET AT BEDTIME AS NEEDED FOR REST. 60 tablet 2   No current facility-administered medications on file prior to visit.    BP 130/70 mmHg  Pulse 74  Temp(Src) 98.3 F (36.8 C) (Oral)  Resp 22  Ht '4\' 11"'$  (1.499 m)  Wt 112 lb (50.803 kg)  BMI 22.61 kg/m2  SpO2 93%      Review of Systems  Constitutional: Negative.   HENT: Negative for congestion, dental problem, hearing loss, rhinorrhea, sinus pressure, sore throat and tinnitus.   Eyes: Negative for pain, discharge and visual disturbance.  Respiratory: Positive for cough and wheezing. Negative for shortness of breath.   Cardiovascular: Negative for chest pain, palpitations and leg swelling.  Gastrointestinal: Negative for nausea, vomiting, abdominal pain, diarrhea, constipation, blood in stool and abdominal distention.  Genitourinary: Negative for dysuria, urgency, frequency, hematuria, flank pain, vaginal bleeding, vaginal discharge, difficulty urinating, vaginal pain and pelvic pain.  Musculoskeletal: Negative for joint swelling, arthralgias and gait problem.  Skin: Negative for rash.  Neurological: Negative for dizziness, syncope, speech difficulty, weakness, numbness and headaches.  Hematological:  Negative for adenopathy.  Psychiatric/Behavioral: Negative for behavioral problems, dysphoric mood and agitation. The patient is not nervous/anxious.        Objective:   Physical Exam  Constitutional: She is oriented to person, place, and time. She appears well-developed and well-nourished.  HENT:  Head: Normocephalic.  Right Ear: External ear normal.  Left Ear: External ear normal.  Mouth/Throat: Oropharynx is clear and moist.  Eyes: Conjunctivae and EOM are normal. Pupils are equal, round, and reactive to light.  Neck: Normal range of motion. Neck supple. No thyromegaly present.  Cardiovascular: Normal rate, regular rhythm, normal heart sounds and intact distal pulses.   Pulmonary/Chest: Effort normal and breath sounds normal. No respiratory distress. She has no wheezes. She has no rales.  Chest  is clear without increased work of breathing.  O2 saturation 93  Abdominal: Soft. Bowel sounds are normal. She exhibits no mass. There is no tenderness.  Musculoskeletal: Normal range of motion.  Lymphadenopathy:    She has no cervical adenopathy.  Neurological: She is alert and oriented to person, place, and time.  Skin: Skin is warm and dry. No rash noted.  Psychiatric: She has a normal mood and affect. Her behavior is normal.          Assessment & Plan:   COPD exacerbation.  The patient has been intolerant of Symbicort.  The patient was given a prescription for Spiriva post hospital admission.  Will refill.  Continue albuterol when necessary Anxiety, depression Essential hypertension, stable

## 2015-09-04 NOTE — Patient Instructions (Signed)
Spiriva use daily Continue pro-air every 4-6 hours as needed for wheezing  Return as scheduled for follow-up

## 2015-09-05 DIAGNOSIS — J439 Emphysema, unspecified: Secondary | ICD-10-CM | POA: Diagnosis not present

## 2015-09-05 DIAGNOSIS — K219 Gastro-esophageal reflux disease without esophagitis: Secondary | ICD-10-CM | POA: Diagnosis not present

## 2015-09-05 DIAGNOSIS — R1310 Dysphagia, unspecified: Secondary | ICD-10-CM | POA: Diagnosis not present

## 2015-09-05 DIAGNOSIS — I2510 Atherosclerotic heart disease of native coronary artery without angina pectoris: Secondary | ICD-10-CM | POA: Diagnosis not present

## 2015-09-05 DIAGNOSIS — I10 Essential (primary) hypertension: Secondary | ICD-10-CM | POA: Diagnosis not present

## 2015-09-05 DIAGNOSIS — G8929 Other chronic pain: Secondary | ICD-10-CM | POA: Diagnosis not present

## 2015-09-07 DIAGNOSIS — R1310 Dysphagia, unspecified: Secondary | ICD-10-CM | POA: Diagnosis not present

## 2015-09-07 DIAGNOSIS — K219 Gastro-esophageal reflux disease without esophagitis: Secondary | ICD-10-CM | POA: Diagnosis not present

## 2015-09-07 DIAGNOSIS — I10 Essential (primary) hypertension: Secondary | ICD-10-CM | POA: Diagnosis not present

## 2015-09-07 DIAGNOSIS — G8929 Other chronic pain: Secondary | ICD-10-CM | POA: Diagnosis not present

## 2015-09-07 DIAGNOSIS — I2510 Atherosclerotic heart disease of native coronary artery without angina pectoris: Secondary | ICD-10-CM | POA: Diagnosis not present

## 2015-09-07 DIAGNOSIS — J439 Emphysema, unspecified: Secondary | ICD-10-CM | POA: Diagnosis not present

## 2015-09-10 DIAGNOSIS — K219 Gastro-esophageal reflux disease without esophagitis: Secondary | ICD-10-CM | POA: Diagnosis not present

## 2015-09-10 DIAGNOSIS — I2510 Atherosclerotic heart disease of native coronary artery without angina pectoris: Secondary | ICD-10-CM | POA: Diagnosis not present

## 2015-09-10 DIAGNOSIS — R1310 Dysphagia, unspecified: Secondary | ICD-10-CM | POA: Diagnosis not present

## 2015-09-10 DIAGNOSIS — J439 Emphysema, unspecified: Secondary | ICD-10-CM | POA: Diagnosis not present

## 2015-09-10 DIAGNOSIS — G8929 Other chronic pain: Secondary | ICD-10-CM | POA: Diagnosis not present

## 2015-09-10 DIAGNOSIS — I10 Essential (primary) hypertension: Secondary | ICD-10-CM | POA: Diagnosis not present

## 2015-09-12 DIAGNOSIS — R1310 Dysphagia, unspecified: Secondary | ICD-10-CM | POA: Diagnosis not present

## 2015-09-12 DIAGNOSIS — K219 Gastro-esophageal reflux disease without esophagitis: Secondary | ICD-10-CM | POA: Diagnosis not present

## 2015-09-12 DIAGNOSIS — I10 Essential (primary) hypertension: Secondary | ICD-10-CM | POA: Diagnosis not present

## 2015-09-12 DIAGNOSIS — G8929 Other chronic pain: Secondary | ICD-10-CM | POA: Diagnosis not present

## 2015-09-12 DIAGNOSIS — J439 Emphysema, unspecified: Secondary | ICD-10-CM | POA: Diagnosis not present

## 2015-09-12 DIAGNOSIS — I2510 Atherosclerotic heart disease of native coronary artery without angina pectoris: Secondary | ICD-10-CM | POA: Diagnosis not present

## 2015-09-14 DIAGNOSIS — J439 Emphysema, unspecified: Secondary | ICD-10-CM | POA: Diagnosis not present

## 2015-09-14 DIAGNOSIS — G8929 Other chronic pain: Secondary | ICD-10-CM | POA: Diagnosis not present

## 2015-09-14 DIAGNOSIS — I2510 Atherosclerotic heart disease of native coronary artery without angina pectoris: Secondary | ICD-10-CM | POA: Diagnosis not present

## 2015-09-14 DIAGNOSIS — K219 Gastro-esophageal reflux disease without esophagitis: Secondary | ICD-10-CM | POA: Diagnosis not present

## 2015-09-14 DIAGNOSIS — I10 Essential (primary) hypertension: Secondary | ICD-10-CM | POA: Diagnosis not present

## 2015-09-14 DIAGNOSIS — R1310 Dysphagia, unspecified: Secondary | ICD-10-CM | POA: Diagnosis not present

## 2015-09-17 DIAGNOSIS — I2510 Atherosclerotic heart disease of native coronary artery without angina pectoris: Secondary | ICD-10-CM | POA: Diagnosis not present

## 2015-09-17 DIAGNOSIS — I10 Essential (primary) hypertension: Secondary | ICD-10-CM | POA: Diagnosis not present

## 2015-09-17 DIAGNOSIS — G8929 Other chronic pain: Secondary | ICD-10-CM | POA: Diagnosis not present

## 2015-09-17 DIAGNOSIS — K219 Gastro-esophageal reflux disease without esophagitis: Secondary | ICD-10-CM | POA: Diagnosis not present

## 2015-09-17 DIAGNOSIS — R1310 Dysphagia, unspecified: Secondary | ICD-10-CM | POA: Diagnosis not present

## 2015-09-17 DIAGNOSIS — J439 Emphysema, unspecified: Secondary | ICD-10-CM | POA: Diagnosis not present

## 2015-09-18 DIAGNOSIS — R1310 Dysphagia, unspecified: Secondary | ICD-10-CM | POA: Diagnosis not present

## 2015-09-18 DIAGNOSIS — I2510 Atherosclerotic heart disease of native coronary artery without angina pectoris: Secondary | ICD-10-CM | POA: Diagnosis not present

## 2015-09-18 DIAGNOSIS — G8929 Other chronic pain: Secondary | ICD-10-CM | POA: Diagnosis not present

## 2015-09-18 DIAGNOSIS — K219 Gastro-esophageal reflux disease without esophagitis: Secondary | ICD-10-CM | POA: Diagnosis not present

## 2015-09-18 DIAGNOSIS — J439 Emphysema, unspecified: Secondary | ICD-10-CM | POA: Diagnosis not present

## 2015-09-18 DIAGNOSIS — I10 Essential (primary) hypertension: Secondary | ICD-10-CM | POA: Diagnosis not present

## 2015-09-19 DIAGNOSIS — I2510 Atherosclerotic heart disease of native coronary artery without angina pectoris: Secondary | ICD-10-CM | POA: Diagnosis not present

## 2015-09-19 DIAGNOSIS — K219 Gastro-esophageal reflux disease without esophagitis: Secondary | ICD-10-CM | POA: Diagnosis not present

## 2015-09-19 DIAGNOSIS — G8929 Other chronic pain: Secondary | ICD-10-CM | POA: Diagnosis not present

## 2015-09-19 DIAGNOSIS — I10 Essential (primary) hypertension: Secondary | ICD-10-CM | POA: Diagnosis not present

## 2015-09-19 DIAGNOSIS — J439 Emphysema, unspecified: Secondary | ICD-10-CM | POA: Diagnosis not present

## 2015-09-19 DIAGNOSIS — R1310 Dysphagia, unspecified: Secondary | ICD-10-CM | POA: Diagnosis not present

## 2015-09-21 ENCOUNTER — Telehealth: Payer: Self-pay | Admitting: Internal Medicine

## 2015-09-21 DIAGNOSIS — K219 Gastro-esophageal reflux disease without esophagitis: Secondary | ICD-10-CM | POA: Diagnosis not present

## 2015-09-21 DIAGNOSIS — R1310 Dysphagia, unspecified: Secondary | ICD-10-CM | POA: Diagnosis not present

## 2015-09-21 DIAGNOSIS — I10 Essential (primary) hypertension: Secondary | ICD-10-CM | POA: Diagnosis not present

## 2015-09-21 DIAGNOSIS — I2510 Atherosclerotic heart disease of native coronary artery without angina pectoris: Secondary | ICD-10-CM | POA: Diagnosis not present

## 2015-09-21 DIAGNOSIS — G8929 Other chronic pain: Secondary | ICD-10-CM | POA: Diagnosis not present

## 2015-09-21 DIAGNOSIS — J439 Emphysema, unspecified: Secondary | ICD-10-CM | POA: Diagnosis not present

## 2015-09-21 NOTE — Telephone Encounter (Signed)
Pt is having a lot of coughing and wheezing.  She has been taking OTC with no relief, pt may need rx. Please advise.

## 2015-09-21 NOTE — Telephone Encounter (Signed)
Acute bronchitis symptoms for less than 10 days are generally not helped by antibiotics.  Take over-the-counter expectorants and cough medications such as  Mucinex DM.  Call if there is no improvement in 5 to 7 days or if  you develop worsening cough, fever, or new symptoms, such as shortness of breath or chest pain.   

## 2015-09-22 NOTE — Telephone Encounter (Signed)
Called and spoke with pt and pt is aware of Dr. Marthann Schiller recommendations. Pt states she has been taking that Mucinex and the tablet is huge.  Pt states she broke her pill cutter trying to cut the pill in half.  Pt crushed the pill and then read on the insert that it should not be taken that way.  Pt states she is feeling better. The cough is the main thing that is keeping her up at night.  Pt has tried OTC cough medications but they are not helping.  Pt request a codeine cough medication. She states she had that a while ago and it helped.  Advised pt that IF Dr. Raliegh Ip approved this medication it would have to be picked up in the office.  Pt states she cannot get out to get the medication.  Pt would like to know if there is another cough medication that can be sent to [facility].  Pls advise.

## 2015-09-24 ENCOUNTER — Telehealth: Payer: Self-pay | Admitting: Internal Medicine

## 2015-09-24 DIAGNOSIS — I2510 Atherosclerotic heart disease of native coronary artery without angina pectoris: Secondary | ICD-10-CM | POA: Diagnosis not present

## 2015-09-24 DIAGNOSIS — R1310 Dysphagia, unspecified: Secondary | ICD-10-CM | POA: Diagnosis not present

## 2015-09-24 DIAGNOSIS — I10 Essential (primary) hypertension: Secondary | ICD-10-CM | POA: Diagnosis not present

## 2015-09-24 DIAGNOSIS — G8929 Other chronic pain: Secondary | ICD-10-CM | POA: Diagnosis not present

## 2015-09-24 DIAGNOSIS — J439 Emphysema, unspecified: Secondary | ICD-10-CM | POA: Diagnosis not present

## 2015-09-24 DIAGNOSIS — K219 Gastro-esophageal reflux disease without esophagitis: Secondary | ICD-10-CM | POA: Diagnosis not present

## 2015-09-24 MED ORDER — BENZONATATE 100 MG PO CAPS: 100.0000 mg | ORAL_CAPSULE | Freq: Two times a day (BID) | ORAL | Status: DC | PRN

## 2015-09-24 NOTE — Telephone Encounter (Signed)
Please call and check on progress; if cough still a problem, call in tessalon  perles 100 mg #20 one BID

## 2015-09-24 NOTE — Telephone Encounter (Signed)
Spoke to pt, asked her how she was feeling? Pt said she is feeling better except for her cough, she is coughing her head off. Told pt will send Rx to pharmacy for Tessalon capsules to help with cough can take one twice a day. Pt verbalized understanding. Told pt if cough does not get better to call. Pt verbalized understanding. Rx sent to pharmacy.

## 2015-09-24 NOTE — Telephone Encounter (Signed)
Heather [name] from [place] is calling needing verbal order to extended OT to twice a wk for 3 wks. Heather [name] also would like order to use breather to strengthening respiratory muscles

## 2015-09-25 DIAGNOSIS — J439 Emphysema, unspecified: Secondary | ICD-10-CM | POA: Diagnosis not present

## 2015-09-25 DIAGNOSIS — I10 Essential (primary) hypertension: Secondary | ICD-10-CM | POA: Diagnosis not present

## 2015-09-25 DIAGNOSIS — I2510 Atherosclerotic heart disease of native coronary artery without angina pectoris: Secondary | ICD-10-CM | POA: Diagnosis not present

## 2015-09-25 DIAGNOSIS — K219 Gastro-esophageal reflux disease without esophagitis: Secondary | ICD-10-CM | POA: Diagnosis not present

## 2015-09-25 DIAGNOSIS — G8929 Other chronic pain: Secondary | ICD-10-CM | POA: Diagnosis not present

## 2015-09-25 DIAGNOSIS — R1310 Dysphagia, unspecified: Secondary | ICD-10-CM | POA: Diagnosis not present

## 2015-09-25 NOTE — Telephone Encounter (Signed)
ok 

## 2015-09-25 NOTE — Telephone Encounter (Signed)
Okay to extend OT for pt?

## 2015-09-25 NOTE — Telephone Encounter (Signed)
Left detailed message on Nancy's voicemail, verbal order for OT twice a week for 3 weeks and breather to strengthening respiratory muscles okay per Dr. Raliegh Ip. Any questions please call office.

## 2015-09-26 DIAGNOSIS — J439 Emphysema, unspecified: Secondary | ICD-10-CM | POA: Diagnosis not present

## 2015-09-26 DIAGNOSIS — I2510 Atherosclerotic heart disease of native coronary artery without angina pectoris: Secondary | ICD-10-CM | POA: Diagnosis not present

## 2015-09-26 DIAGNOSIS — I10 Essential (primary) hypertension: Secondary | ICD-10-CM | POA: Diagnosis not present

## 2015-09-26 DIAGNOSIS — R1310 Dysphagia, unspecified: Secondary | ICD-10-CM | POA: Diagnosis not present

## 2015-09-26 DIAGNOSIS — G8929 Other chronic pain: Secondary | ICD-10-CM | POA: Diagnosis not present

## 2015-09-26 DIAGNOSIS — K219 Gastro-esophageal reflux disease without esophagitis: Secondary | ICD-10-CM | POA: Diagnosis not present

## 2015-09-27 DIAGNOSIS — J439 Emphysema, unspecified: Secondary | ICD-10-CM | POA: Diagnosis not present

## 2015-09-27 DIAGNOSIS — R1310 Dysphagia, unspecified: Secondary | ICD-10-CM | POA: Diagnosis not present

## 2015-09-27 DIAGNOSIS — I2510 Atherosclerotic heart disease of native coronary artery without angina pectoris: Secondary | ICD-10-CM | POA: Diagnosis not present

## 2015-09-27 DIAGNOSIS — I10 Essential (primary) hypertension: Secondary | ICD-10-CM | POA: Diagnosis not present

## 2015-09-27 DIAGNOSIS — G8929 Other chronic pain: Secondary | ICD-10-CM | POA: Diagnosis not present

## 2015-09-27 DIAGNOSIS — K219 Gastro-esophageal reflux disease without esophagitis: Secondary | ICD-10-CM | POA: Diagnosis not present

## 2015-09-28 ENCOUNTER — Encounter: Payer: Medicare Other | Admitting: Internal Medicine

## 2015-09-28 DIAGNOSIS — I2510 Atherosclerotic heart disease of native coronary artery without angina pectoris: Secondary | ICD-10-CM | POA: Diagnosis not present

## 2015-09-28 DIAGNOSIS — K219 Gastro-esophageal reflux disease without esophagitis: Secondary | ICD-10-CM | POA: Diagnosis not present

## 2015-09-28 DIAGNOSIS — J439 Emphysema, unspecified: Secondary | ICD-10-CM | POA: Diagnosis not present

## 2015-09-28 DIAGNOSIS — G8929 Other chronic pain: Secondary | ICD-10-CM | POA: Diagnosis not present

## 2015-09-28 DIAGNOSIS — I10 Essential (primary) hypertension: Secondary | ICD-10-CM | POA: Diagnosis not present

## 2015-09-28 DIAGNOSIS — R1310 Dysphagia, unspecified: Secondary | ICD-10-CM | POA: Diagnosis not present

## 2015-10-01 DIAGNOSIS — J439 Emphysema, unspecified: Secondary | ICD-10-CM | POA: Diagnosis not present

## 2015-10-01 DIAGNOSIS — G8929 Other chronic pain: Secondary | ICD-10-CM | POA: Diagnosis not present

## 2015-10-01 DIAGNOSIS — I2510 Atherosclerotic heart disease of native coronary artery without angina pectoris: Secondary | ICD-10-CM | POA: Diagnosis not present

## 2015-10-01 DIAGNOSIS — I10 Essential (primary) hypertension: Secondary | ICD-10-CM | POA: Diagnosis not present

## 2015-10-01 DIAGNOSIS — K219 Gastro-esophageal reflux disease without esophagitis: Secondary | ICD-10-CM | POA: Diagnosis not present

## 2015-10-01 DIAGNOSIS — R1310 Dysphagia, unspecified: Secondary | ICD-10-CM | POA: Diagnosis not present

## 2015-10-02 ENCOUNTER — Telehealth: Payer: Self-pay | Admitting: Internal Medicine

## 2015-10-02 DIAGNOSIS — G8929 Other chronic pain: Secondary | ICD-10-CM | POA: Diagnosis not present

## 2015-10-02 DIAGNOSIS — K219 Gastro-esophageal reflux disease without esophagitis: Secondary | ICD-10-CM | POA: Diagnosis not present

## 2015-10-02 DIAGNOSIS — J439 Emphysema, unspecified: Secondary | ICD-10-CM | POA: Diagnosis not present

## 2015-10-02 DIAGNOSIS — R1310 Dysphagia, unspecified: Secondary | ICD-10-CM | POA: Diagnosis not present

## 2015-10-02 DIAGNOSIS — I10 Essential (primary) hypertension: Secondary | ICD-10-CM | POA: Diagnosis not present

## 2015-10-02 DIAGNOSIS — I2510 Atherosclerotic heart disease of native coronary artery without angina pectoris: Secondary | ICD-10-CM | POA: Diagnosis not present

## 2015-10-02 NOTE — Telephone Encounter (Signed)
Okay to extend PT for pt?

## 2015-10-02 NOTE — Telephone Encounter (Signed)
Heather [name] w/ [facility] Would like to request extension for physical therapy :  2 X / wk for 2 weeks

## 2015-10-02 NOTE — Telephone Encounter (Signed)
ok 

## 2015-10-03 ENCOUNTER — Other Ambulatory Visit: Payer: Self-pay | Admitting: Internal Medicine

## 2015-10-03 DIAGNOSIS — J439 Emphysema, unspecified: Secondary | ICD-10-CM | POA: Diagnosis not present

## 2015-10-03 DIAGNOSIS — I2510 Atherosclerotic heart disease of native coronary artery without angina pectoris: Secondary | ICD-10-CM | POA: Diagnosis not present

## 2015-10-03 DIAGNOSIS — I10 Essential (primary) hypertension: Secondary | ICD-10-CM | POA: Diagnosis not present

## 2015-10-03 DIAGNOSIS — R1310 Dysphagia, unspecified: Secondary | ICD-10-CM | POA: Diagnosis not present

## 2015-10-03 DIAGNOSIS — K219 Gastro-esophageal reflux disease without esophagitis: Secondary | ICD-10-CM | POA: Diagnosis not present

## 2015-10-03 DIAGNOSIS — G8929 Other chronic pain: Secondary | ICD-10-CM | POA: Diagnosis not present

## 2015-10-03 NOTE — Telephone Encounter (Signed)
Spoke with Andee Poles and gave her verbal orders. She will call back in 2 weeks with progress report.//am/cma.

## 2015-10-04 DIAGNOSIS — I2510 Atherosclerotic heart disease of native coronary artery without angina pectoris: Secondary | ICD-10-CM | POA: Diagnosis not present

## 2015-10-04 DIAGNOSIS — G8929 Other chronic pain: Secondary | ICD-10-CM | POA: Diagnosis not present

## 2015-10-04 DIAGNOSIS — I10 Essential (primary) hypertension: Secondary | ICD-10-CM | POA: Diagnosis not present

## 2015-10-04 DIAGNOSIS — K219 Gastro-esophageal reflux disease without esophagitis: Secondary | ICD-10-CM | POA: Diagnosis not present

## 2015-10-04 DIAGNOSIS — J439 Emphysema, unspecified: Secondary | ICD-10-CM | POA: Diagnosis not present

## 2015-10-04 DIAGNOSIS — R1310 Dysphagia, unspecified: Secondary | ICD-10-CM | POA: Diagnosis not present

## 2015-10-05 ENCOUNTER — Other Ambulatory Visit: Payer: Self-pay | Admitting: Internal Medicine

## 2015-10-06 DIAGNOSIS — R1310 Dysphagia, unspecified: Secondary | ICD-10-CM | POA: Diagnosis not present

## 2015-10-06 DIAGNOSIS — I2510 Atherosclerotic heart disease of native coronary artery without angina pectoris: Secondary | ICD-10-CM | POA: Diagnosis not present

## 2015-10-06 DIAGNOSIS — I10 Essential (primary) hypertension: Secondary | ICD-10-CM | POA: Diagnosis not present

## 2015-10-06 DIAGNOSIS — K219 Gastro-esophageal reflux disease without esophagitis: Secondary | ICD-10-CM | POA: Diagnosis not present

## 2015-10-06 DIAGNOSIS — J439 Emphysema, unspecified: Secondary | ICD-10-CM | POA: Diagnosis not present

## 2015-10-06 DIAGNOSIS — G8929 Other chronic pain: Secondary | ICD-10-CM | POA: Diagnosis not present

## 2015-10-08 ENCOUNTER — Other Ambulatory Visit: Payer: Self-pay | Admitting: Internal Medicine

## 2015-10-08 MED ORDER — CLORAZEPATE DIPOTASSIUM 7.5 MG PO TABS: 7.5000 mg | ORAL_TABLET | Freq: Two times a day (BID) | ORAL | Status: DC | PRN

## 2015-10-09 DIAGNOSIS — I10 Essential (primary) hypertension: Secondary | ICD-10-CM | POA: Diagnosis not present

## 2015-10-09 DIAGNOSIS — K219 Gastro-esophageal reflux disease without esophagitis: Secondary | ICD-10-CM | POA: Diagnosis not present

## 2015-10-09 DIAGNOSIS — J439 Emphysema, unspecified: Secondary | ICD-10-CM | POA: Diagnosis not present

## 2015-10-09 DIAGNOSIS — G8929 Other chronic pain: Secondary | ICD-10-CM | POA: Diagnosis not present

## 2015-10-09 DIAGNOSIS — I2510 Atherosclerotic heart disease of native coronary artery without angina pectoris: Secondary | ICD-10-CM | POA: Diagnosis not present

## 2015-10-09 DIAGNOSIS — R1310 Dysphagia, unspecified: Secondary | ICD-10-CM | POA: Diagnosis not present

## 2015-10-11 DIAGNOSIS — I10 Essential (primary) hypertension: Secondary | ICD-10-CM | POA: Diagnosis not present

## 2015-10-11 DIAGNOSIS — G8929 Other chronic pain: Secondary | ICD-10-CM | POA: Diagnosis not present

## 2015-10-11 DIAGNOSIS — I2510 Atherosclerotic heart disease of native coronary artery without angina pectoris: Secondary | ICD-10-CM | POA: Diagnosis not present

## 2015-10-11 DIAGNOSIS — R1310 Dysphagia, unspecified: Secondary | ICD-10-CM | POA: Diagnosis not present

## 2015-10-11 DIAGNOSIS — J439 Emphysema, unspecified: Secondary | ICD-10-CM | POA: Diagnosis not present

## 2015-10-11 DIAGNOSIS — K219 Gastro-esophageal reflux disease without esophagitis: Secondary | ICD-10-CM | POA: Diagnosis not present

## 2015-10-12 ENCOUNTER — Telehealth: Payer: Self-pay

## 2015-10-12 ENCOUNTER — Telehealth: Payer: Self-pay | Admitting: Internal Medicine

## 2015-10-12 ENCOUNTER — Ambulatory Visit (INDEPENDENT_AMBULATORY_CARE_PROVIDER_SITE_OTHER): Payer: Medicare Other | Admitting: Family Medicine

## 2015-10-12 ENCOUNTER — Encounter: Payer: Self-pay | Admitting: Family Medicine

## 2015-10-12 VITALS — BP 155/76 | HR 84 | Temp 98.1°F | Ht 59.0 in | Wt 113.0 lb

## 2015-10-12 DIAGNOSIS — I2510 Atherosclerotic heart disease of native coronary artery without angina pectoris: Secondary | ICD-10-CM

## 2015-10-12 DIAGNOSIS — I10 Essential (primary) hypertension: Secondary | ICD-10-CM | POA: Diagnosis not present

## 2015-10-12 DIAGNOSIS — G8929 Other chronic pain: Secondary | ICD-10-CM | POA: Diagnosis not present

## 2015-10-12 DIAGNOSIS — J209 Acute bronchitis, unspecified: Secondary | ICD-10-CM | POA: Diagnosis not present

## 2015-10-12 DIAGNOSIS — J439 Emphysema, unspecified: Secondary | ICD-10-CM | POA: Diagnosis not present

## 2015-10-12 DIAGNOSIS — R1310 Dysphagia, unspecified: Secondary | ICD-10-CM | POA: Diagnosis not present

## 2015-10-12 DIAGNOSIS — K219 Gastro-esophageal reflux disease without esophagitis: Secondary | ICD-10-CM | POA: Diagnosis not present

## 2015-10-12 MED ORDER — METHYLPREDNISOLONE ACETATE 80 MG/ML IJ SUSP
120.0000 mg | Freq: Once | INTRAMUSCULAR | Status: AC
  Administered 2015-10-12: 120 mg via INTRAMUSCULAR

## 2015-10-12 MED ORDER — AZITHROMYCIN 250 MG PO TABS
ORAL_TABLET | ORAL | Status: DC
Start: 2015-10-12 — End: 2015-12-11

## 2015-10-12 NOTE — Telephone Encounter (Signed)
Patient Name: DAVIONNE MASTRANGELO  DOB: Jan 17, 1935    Initial Comment Marcene Brawn from [facility], pt is wheezing and c/o nausea, [phone]   Nurse Assessment  Nurse: Raphael Gibney, RN, Vanita Ingles Date/Time Eilene Ghazi Time): 10/12/2015 9:09:38 AM  Confirm and document reason for call. If symptomatic, describe symptoms. ---Caller states pt is having wheezing in both lungs throughout her lungs. She is nauseated. She has emphysema. She is nauseated. Pulse 96 BP 124/68. O2 sat 92% on room air. She feels SOB. She can hear her wheezing audibly.  Has the patient traveled out of the country within the last 30 days? ---No  Does the patient have any new or worsening symptoms? ---Yes  Will a triage be completed? ---Yes  Related visit to physician within the last 2 weeks? ---No  Does the PT have any chronic conditions? (i.e. diabetes, asthma, etc.) ---Yes  List chronic conditions. ---emphysema     Guidelines    Guideline Title Affirmed Question Affirmed Notes  Breathing Difficulty Wheezing can be heard across the room    Final Disposition User   Go to ED Now Raphael Gibney, RN, Vanita Ingles    Comments  Called back line at office and gave report that the home health nurse called about the pt and she has wheezing throughout her lungs; she is having loud audible wheezing with O2 sat of 92% on room air; advised to go to ER and [facility] nurse has already advised her of that and she is refusing to go. Nurse in office with let Dr. Melina Fiddler know and will call pt back. Also provided home health nurse's number.   Referrals  GO TO FACILITY REFUSED   Disagree/Comply: Disagree  Disagree/Comply Reason: Disagree with instructions

## 2015-10-12 NOTE — Telephone Encounter (Signed)
ok 

## 2015-10-12 NOTE — Telephone Encounter (Signed)
See message and advise if orders are okay to recert skilled nursing for management for her emphysema.

## 2015-10-12 NOTE — Telephone Encounter (Signed)
Pt scheduled to see Dr Fry today.  

## 2015-10-12 NOTE — Progress Notes (Signed)
   Subjective:    Patient ID: Heather [name], female    DOB: 09-04-1935, 80 y.o.   MRN: [id number]  HPI Here for 3 days of chest tightness, wheezing, and coughing up yellow sputum. No fever or chest pain. Using her albuterol inhaler and this gives her temporary relief.    Review of Systems  Constitutional: Negative.   HENT: Negative.   Eyes: Negative.   Respiratory: Positive for cough, chest tightness, shortness of breath and wheezing.   Cardiovascular: Negative for chest pain, palpitations and leg swelling.  Gastrointestinal: Negative.        Objective:   Physical Exam  Constitutional: She appears well-developed and well-nourished. No distress.  Neck: Neck supple. No thyromegaly present.  Cardiovascular: Normal rate, regular rhythm, normal heart sounds and intact distal pulses.   Pulmonary/Chest: Effort normal. She has no rales.  Scattered wheezes and rhonchi   Lymphadenopathy:    She has no cervical adenopathy.          Assessment & Plan:  Acute bronchitis on top of chronic COPD. Treat with a Zpack and a steroid shot. Recheck prn

## 2015-10-12 NOTE — Telephone Encounter (Signed)
Spoke to Heather [name] with [facility], verbal orders given to recert her for continuing skilled nursing for management for her emphysema  2 x's a week for 5 weeks  and 1 x a week 4 weeks okay per Dr.K. Arbie Cookey verbalized understanding.

## 2015-10-12 NOTE — Progress Notes (Signed)
Pre visit review using our clinic review tool, if applicable. No additional management support is needed unless otherwise documented below in the visit note. 

## 2015-10-12 NOTE — Telephone Encounter (Signed)
Marcene Brawn from [facility] called again to request a verbal order to recert her for continuing skilled nursing for management for her emphazema.  Need it for 2 weeks 5 and one week 4.

## 2015-10-15 DIAGNOSIS — I10 Essential (primary) hypertension: Secondary | ICD-10-CM | POA: Diagnosis not present

## 2015-10-15 DIAGNOSIS — J439 Emphysema, unspecified: Secondary | ICD-10-CM | POA: Diagnosis not present

## 2015-10-15 DIAGNOSIS — G8929 Other chronic pain: Secondary | ICD-10-CM | POA: Diagnosis not present

## 2015-10-15 DIAGNOSIS — I2510 Atherosclerotic heart disease of native coronary artery without angina pectoris: Secondary | ICD-10-CM | POA: Diagnosis not present

## 2015-10-15 DIAGNOSIS — K219 Gastro-esophageal reflux disease without esophagitis: Secondary | ICD-10-CM | POA: Diagnosis not present

## 2015-10-15 DIAGNOSIS — R1310 Dysphagia, unspecified: Secondary | ICD-10-CM | POA: Diagnosis not present

## 2015-10-16 ENCOUNTER — Telehealth: Payer: Self-pay | Admitting: Internal Medicine

## 2015-10-16 DIAGNOSIS — I10 Essential (primary) hypertension: Secondary | ICD-10-CM | POA: Diagnosis not present

## 2015-10-16 DIAGNOSIS — F329 Major depressive disorder, single episode, unspecified: Secondary | ICD-10-CM | POA: Diagnosis not present

## 2015-10-16 DIAGNOSIS — F419 Anxiety disorder, unspecified: Secondary | ICD-10-CM | POA: Diagnosis not present

## 2015-10-16 DIAGNOSIS — I2510 Atherosclerotic heart disease of native coronary artery without angina pectoris: Secondary | ICD-10-CM | POA: Diagnosis not present

## 2015-10-16 DIAGNOSIS — J439 Emphysema, unspecified: Secondary | ICD-10-CM | POA: Diagnosis not present

## 2015-10-16 DIAGNOSIS — G8929 Other chronic pain: Secondary | ICD-10-CM | POA: Diagnosis not present

## 2015-10-16 NOTE — Telephone Encounter (Signed)
Heather [name] w/ [facility] would like orders for : 2 x wk for 6 weeks for PT  Verbal is requested  Pt has been re certified and this is in addition to previous order

## 2015-10-16 NOTE — Telephone Encounter (Signed)
Spoke to [employer], verbal orders given for Physical Therapy 2 x wk for 6 weeks okay per Dr. Braulio Bosch verbalized understanding.

## 2015-10-17 DIAGNOSIS — F329 Major depressive disorder, single episode, unspecified: Secondary | ICD-10-CM | POA: Diagnosis not present

## 2015-10-17 DIAGNOSIS — J439 Emphysema, unspecified: Secondary | ICD-10-CM | POA: Diagnosis not present

## 2015-10-17 DIAGNOSIS — F419 Anxiety disorder, unspecified: Secondary | ICD-10-CM | POA: Diagnosis not present

## 2015-10-17 DIAGNOSIS — I2510 Atherosclerotic heart disease of native coronary artery without angina pectoris: Secondary | ICD-10-CM | POA: Diagnosis not present

## 2015-10-17 DIAGNOSIS — I10 Essential (primary) hypertension: Secondary | ICD-10-CM | POA: Diagnosis not present

## 2015-10-17 DIAGNOSIS — G8929 Other chronic pain: Secondary | ICD-10-CM | POA: Diagnosis not present

## 2015-10-18 DIAGNOSIS — G8929 Other chronic pain: Secondary | ICD-10-CM | POA: Diagnosis not present

## 2015-10-18 DIAGNOSIS — J439 Emphysema, unspecified: Secondary | ICD-10-CM | POA: Diagnosis not present

## 2015-10-18 DIAGNOSIS — I2510 Atherosclerotic heart disease of native coronary artery without angina pectoris: Secondary | ICD-10-CM | POA: Diagnosis not present

## 2015-10-18 DIAGNOSIS — F329 Major depressive disorder, single episode, unspecified: Secondary | ICD-10-CM | POA: Diagnosis not present

## 2015-10-18 DIAGNOSIS — I10 Essential (primary) hypertension: Secondary | ICD-10-CM | POA: Diagnosis not present

## 2015-10-18 DIAGNOSIS — F419 Anxiety disorder, unspecified: Secondary | ICD-10-CM | POA: Diagnosis not present

## 2015-10-19 ENCOUNTER — Telehealth: Payer: Self-pay | Admitting: Internal Medicine

## 2015-10-19 NOTE — Telephone Encounter (Signed)
Heather [name], Occupational therapist with Heather [name], needs VO for her to continue 2 times a week through Dec 3 to continue working on ADL's, strengthening and endurance. Except for the week of Thanksgiving and that needs to be once week b/c it is a short week.

## 2015-10-19 NOTE — Telephone Encounter (Signed)
Called Izora Gala with [facility] verbal orders given to continue 2 times a week through Dec 3 to continue working on ADL's, strengthening and endurance for pt okay per Dr. Shan Levans verbalized understanding.

## 2015-10-23 ENCOUNTER — Other Ambulatory Visit: Payer: Self-pay | Admitting: Internal Medicine

## 2015-10-23 DIAGNOSIS — G8929 Other chronic pain: Secondary | ICD-10-CM | POA: Diagnosis not present

## 2015-10-23 DIAGNOSIS — F329 Major depressive disorder, single episode, unspecified: Secondary | ICD-10-CM | POA: Diagnosis not present

## 2015-10-23 DIAGNOSIS — I10 Essential (primary) hypertension: Secondary | ICD-10-CM | POA: Diagnosis not present

## 2015-10-23 DIAGNOSIS — J439 Emphysema, unspecified: Secondary | ICD-10-CM | POA: Diagnosis not present

## 2015-10-23 DIAGNOSIS — F419 Anxiety disorder, unspecified: Secondary | ICD-10-CM | POA: Diagnosis not present

## 2015-10-23 DIAGNOSIS — I2510 Atherosclerotic heart disease of native coronary artery without angina pectoris: Secondary | ICD-10-CM | POA: Diagnosis not present

## 2015-10-25 DIAGNOSIS — I10 Essential (primary) hypertension: Secondary | ICD-10-CM | POA: Diagnosis not present

## 2015-10-25 DIAGNOSIS — J439 Emphysema, unspecified: Secondary | ICD-10-CM | POA: Diagnosis not present

## 2015-10-25 DIAGNOSIS — G8929 Other chronic pain: Secondary | ICD-10-CM | POA: Diagnosis not present

## 2015-10-25 DIAGNOSIS — F419 Anxiety disorder, unspecified: Secondary | ICD-10-CM | POA: Diagnosis not present

## 2015-10-25 DIAGNOSIS — F329 Major depressive disorder, single episode, unspecified: Secondary | ICD-10-CM | POA: Diagnosis not present

## 2015-10-25 DIAGNOSIS — I2510 Atherosclerotic heart disease of native coronary artery without angina pectoris: Secondary | ICD-10-CM | POA: Diagnosis not present

## 2015-10-26 DIAGNOSIS — J439 Emphysema, unspecified: Secondary | ICD-10-CM | POA: Diagnosis not present

## 2015-10-28 DIAGNOSIS — I10 Essential (primary) hypertension: Secondary | ICD-10-CM | POA: Diagnosis not present

## 2015-10-28 DIAGNOSIS — F419 Anxiety disorder, unspecified: Secondary | ICD-10-CM | POA: Diagnosis not present

## 2015-10-28 DIAGNOSIS — J439 Emphysema, unspecified: Secondary | ICD-10-CM | POA: Diagnosis not present

## 2015-10-28 DIAGNOSIS — F329 Major depressive disorder, single episode, unspecified: Secondary | ICD-10-CM | POA: Diagnosis not present

## 2015-10-28 DIAGNOSIS — I2510 Atherosclerotic heart disease of native coronary artery without angina pectoris: Secondary | ICD-10-CM | POA: Diagnosis not present

## 2015-10-28 DIAGNOSIS — G8929 Other chronic pain: Secondary | ICD-10-CM | POA: Diagnosis not present

## 2015-10-29 DIAGNOSIS — I2510 Atherosclerotic heart disease of native coronary artery without angina pectoris: Secondary | ICD-10-CM | POA: Diagnosis not present

## 2015-10-29 DIAGNOSIS — J439 Emphysema, unspecified: Secondary | ICD-10-CM | POA: Diagnosis not present

## 2015-10-29 DIAGNOSIS — F329 Major depressive disorder, single episode, unspecified: Secondary | ICD-10-CM | POA: Diagnosis not present

## 2015-10-29 DIAGNOSIS — F419 Anxiety disorder, unspecified: Secondary | ICD-10-CM | POA: Diagnosis not present

## 2015-10-29 DIAGNOSIS — G8929 Other chronic pain: Secondary | ICD-10-CM | POA: Diagnosis not present

## 2015-10-29 DIAGNOSIS — I10 Essential (primary) hypertension: Secondary | ICD-10-CM | POA: Diagnosis not present

## 2015-10-31 DIAGNOSIS — I10 Essential (primary) hypertension: Secondary | ICD-10-CM | POA: Diagnosis not present

## 2015-10-31 DIAGNOSIS — F419 Anxiety disorder, unspecified: Secondary | ICD-10-CM | POA: Diagnosis not present

## 2015-10-31 DIAGNOSIS — F329 Major depressive disorder, single episode, unspecified: Secondary | ICD-10-CM | POA: Diagnosis not present

## 2015-10-31 DIAGNOSIS — J439 Emphysema, unspecified: Secondary | ICD-10-CM | POA: Diagnosis not present

## 2015-10-31 DIAGNOSIS — G8929 Other chronic pain: Secondary | ICD-10-CM | POA: Diagnosis not present

## 2015-10-31 DIAGNOSIS — I2510 Atherosclerotic heart disease of native coronary artery without angina pectoris: Secondary | ICD-10-CM | POA: Diagnosis not present

## 2015-11-02 DIAGNOSIS — I2510 Atherosclerotic heart disease of native coronary artery without angina pectoris: Secondary | ICD-10-CM | POA: Diagnosis not present

## 2015-11-02 DIAGNOSIS — J439 Emphysema, unspecified: Secondary | ICD-10-CM | POA: Diagnosis not present

## 2015-11-02 DIAGNOSIS — F329 Major depressive disorder, single episode, unspecified: Secondary | ICD-10-CM | POA: Diagnosis not present

## 2015-11-02 DIAGNOSIS — F419 Anxiety disorder, unspecified: Secondary | ICD-10-CM | POA: Diagnosis not present

## 2015-11-02 DIAGNOSIS — G8929 Other chronic pain: Secondary | ICD-10-CM | POA: Diagnosis not present

## 2015-11-02 DIAGNOSIS — I10 Essential (primary) hypertension: Secondary | ICD-10-CM | POA: Diagnosis not present

## 2015-11-05 DIAGNOSIS — I10 Essential (primary) hypertension: Secondary | ICD-10-CM | POA: Diagnosis not present

## 2015-11-05 DIAGNOSIS — J439 Emphysema, unspecified: Secondary | ICD-10-CM | POA: Diagnosis not present

## 2015-11-05 DIAGNOSIS — F329 Major depressive disorder, single episode, unspecified: Secondary | ICD-10-CM | POA: Diagnosis not present

## 2015-11-05 DIAGNOSIS — I2510 Atherosclerotic heart disease of native coronary artery without angina pectoris: Secondary | ICD-10-CM | POA: Diagnosis not present

## 2015-11-05 DIAGNOSIS — F419 Anxiety disorder, unspecified: Secondary | ICD-10-CM | POA: Diagnosis not present

## 2015-11-05 DIAGNOSIS — G8929 Other chronic pain: Secondary | ICD-10-CM | POA: Diagnosis not present

## 2015-11-06 DIAGNOSIS — I10 Essential (primary) hypertension: Secondary | ICD-10-CM | POA: Diagnosis not present

## 2015-11-06 DIAGNOSIS — I2510 Atherosclerotic heart disease of native coronary artery without angina pectoris: Secondary | ICD-10-CM | POA: Diagnosis not present

## 2015-11-06 DIAGNOSIS — G8929 Other chronic pain: Secondary | ICD-10-CM | POA: Diagnosis not present

## 2015-11-06 DIAGNOSIS — J439 Emphysema, unspecified: Secondary | ICD-10-CM | POA: Diagnosis not present

## 2015-11-06 DIAGNOSIS — F419 Anxiety disorder, unspecified: Secondary | ICD-10-CM | POA: Diagnosis not present

## 2015-11-06 DIAGNOSIS — F329 Major depressive disorder, single episode, unspecified: Secondary | ICD-10-CM | POA: Diagnosis not present

## 2015-11-07 DIAGNOSIS — G8929 Other chronic pain: Secondary | ICD-10-CM | POA: Diagnosis not present

## 2015-11-07 DIAGNOSIS — F329 Major depressive disorder, single episode, unspecified: Secondary | ICD-10-CM | POA: Diagnosis not present

## 2015-11-07 DIAGNOSIS — J439 Emphysema, unspecified: Secondary | ICD-10-CM | POA: Diagnosis not present

## 2015-11-07 DIAGNOSIS — I2510 Atherosclerotic heart disease of native coronary artery without angina pectoris: Secondary | ICD-10-CM | POA: Diagnosis not present

## 2015-11-07 DIAGNOSIS — F419 Anxiety disorder, unspecified: Secondary | ICD-10-CM | POA: Diagnosis not present

## 2015-11-07 DIAGNOSIS — I10 Essential (primary) hypertension: Secondary | ICD-10-CM | POA: Diagnosis not present

## 2015-11-08 ENCOUNTER — Telehealth: Payer: Self-pay | Admitting: Internal Medicine

## 2015-11-08 DIAGNOSIS — F419 Anxiety disorder, unspecified: Secondary | ICD-10-CM | POA: Diagnosis not present

## 2015-11-08 DIAGNOSIS — I2510 Atherosclerotic heart disease of native coronary artery without angina pectoris: Secondary | ICD-10-CM | POA: Diagnosis not present

## 2015-11-08 DIAGNOSIS — F329 Major depressive disorder, single episode, unspecified: Secondary | ICD-10-CM | POA: Diagnosis not present

## 2015-11-08 DIAGNOSIS — I10 Essential (primary) hypertension: Secondary | ICD-10-CM | POA: Diagnosis not present

## 2015-11-08 DIAGNOSIS — G8929 Other chronic pain: Secondary | ICD-10-CM | POA: Diagnosis not present

## 2015-11-08 DIAGNOSIS — J439 Emphysema, unspecified: Secondary | ICD-10-CM | POA: Diagnosis not present

## 2015-11-08 NOTE — Telephone Encounter (Signed)
Heather [name]. From [place] called saying Ms. Hayden told her two days ago she had extremely bad diarrhea and her bowel movements were the color of tar. You can call Arbie Cookey or Katherine directly regarding this.  Arbie Cookey H's ph# [phone] Thank you.

## 2015-11-09 NOTE — Telephone Encounter (Signed)
FYI

## 2015-11-09 NOTE — Telephone Encounter (Signed)
Spoke to pt, asked her if she was still having diarrhea or black stools? Pt said no, she had a normal bowel movement today and no blood, was constipated and took stool softner and laxative. Told pt okay if she has black stools again to let us know. Pt verbalized understanding.

## 2015-11-12 ENCOUNTER — Other Ambulatory Visit: Payer: Self-pay | Admitting: Internal Medicine

## 2015-11-12 DIAGNOSIS — I10 Essential (primary) hypertension: Secondary | ICD-10-CM | POA: Diagnosis not present

## 2015-11-12 DIAGNOSIS — J439 Emphysema, unspecified: Secondary | ICD-10-CM | POA: Diagnosis not present

## 2015-11-12 DIAGNOSIS — F419 Anxiety disorder, unspecified: Secondary | ICD-10-CM | POA: Diagnosis not present

## 2015-11-12 DIAGNOSIS — F329 Major depressive disorder, single episode, unspecified: Secondary | ICD-10-CM | POA: Diagnosis not present

## 2015-11-12 DIAGNOSIS — I2510 Atherosclerotic heart disease of native coronary artery without angina pectoris: Secondary | ICD-10-CM | POA: Diagnosis not present

## 2015-11-12 DIAGNOSIS — G8929 Other chronic pain: Secondary | ICD-10-CM | POA: Diagnosis not present

## 2015-11-14 ENCOUNTER — Other Ambulatory Visit: Payer: Self-pay | Admitting: Internal Medicine

## 2015-11-14 DIAGNOSIS — F329 Major depressive disorder, single episode, unspecified: Secondary | ICD-10-CM | POA: Diagnosis not present

## 2015-11-14 DIAGNOSIS — J439 Emphysema, unspecified: Secondary | ICD-10-CM | POA: Diagnosis not present

## 2015-11-14 DIAGNOSIS — I2510 Atherosclerotic heart disease of native coronary artery without angina pectoris: Secondary | ICD-10-CM | POA: Diagnosis not present

## 2015-11-14 DIAGNOSIS — F419 Anxiety disorder, unspecified: Secondary | ICD-10-CM | POA: Diagnosis not present

## 2015-11-14 DIAGNOSIS — I10 Essential (primary) hypertension: Secondary | ICD-10-CM | POA: Diagnosis not present

## 2015-11-14 DIAGNOSIS — G8929 Other chronic pain: Secondary | ICD-10-CM | POA: Diagnosis not present

## 2015-11-14 MED ORDER — TRAZODONE HCL 50 MG PO TABS: ORAL_TABLET | ORAL | Status: DC

## 2015-11-15 DIAGNOSIS — G8929 Other chronic pain: Secondary | ICD-10-CM | POA: Diagnosis not present

## 2015-11-15 DIAGNOSIS — J439 Emphysema, unspecified: Secondary | ICD-10-CM | POA: Diagnosis not present

## 2015-11-15 DIAGNOSIS — F419 Anxiety disorder, unspecified: Secondary | ICD-10-CM | POA: Diagnosis not present

## 2015-11-15 DIAGNOSIS — I2510 Atherosclerotic heart disease of native coronary artery without angina pectoris: Secondary | ICD-10-CM | POA: Diagnosis not present

## 2015-11-15 DIAGNOSIS — I10 Essential (primary) hypertension: Secondary | ICD-10-CM | POA: Diagnosis not present

## 2015-11-15 DIAGNOSIS — F329 Major depressive disorder, single episode, unspecified: Secondary | ICD-10-CM | POA: Diagnosis not present

## 2015-11-17 ENCOUNTER — Emergency Department (HOSPITAL_COMMUNITY): Payer: Medicare Other

## 2015-11-17 ENCOUNTER — Emergency Department (HOSPITAL_COMMUNITY)
Admission: EM | Admit: 2015-11-17 | Discharge: 2015-11-18 | Disposition: A | Discharge status: Home / Self Care | Payer: Medicare Other | Attending: Emergency Medicine | Admitting: Emergency Medicine

## 2015-11-17 ENCOUNTER — Encounter (HOSPITAL_COMMUNITY): Payer: Self-pay | Admitting: Emergency Medicine

## 2015-11-17 DIAGNOSIS — F419 Anxiety disorder, unspecified: Secondary | ICD-10-CM | POA: Diagnosis not present

## 2015-11-17 DIAGNOSIS — R1084 Generalized abdominal pain: Secondary | ICD-10-CM

## 2015-11-17 DIAGNOSIS — G8929 Other chronic pain: Secondary | ICD-10-CM | POA: Insufficient documentation

## 2015-11-17 DIAGNOSIS — Z8719 Personal history of other diseases of the digestive system: Secondary | ICD-10-CM | POA: Insufficient documentation

## 2015-11-17 DIAGNOSIS — Z90710 Acquired absence of both cervix and uterus: Secondary | ICD-10-CM | POA: Diagnosis not present

## 2015-11-17 DIAGNOSIS — Z79899 Other long term (current) drug therapy: Secondary | ICD-10-CM | POA: Insufficient documentation

## 2015-11-17 DIAGNOSIS — R112 Nausea with vomiting, unspecified: Secondary | ICD-10-CM | POA: Diagnosis not present

## 2015-11-17 DIAGNOSIS — I10 Essential (primary) hypertension: Secondary | ICD-10-CM | POA: Diagnosis not present

## 2015-11-17 DIAGNOSIS — R140 Abdominal distension (gaseous): Secondary | ICD-10-CM | POA: Diagnosis not present

## 2015-11-17 DIAGNOSIS — F329 Major depressive disorder, single episode, unspecified: Secondary | ICD-10-CM | POA: Diagnosis not present

## 2015-11-17 DIAGNOSIS — E039 Hypothyroidism, unspecified: Secondary | ICD-10-CM | POA: Insufficient documentation

## 2015-11-17 DIAGNOSIS — G4700 Insomnia, unspecified: Secondary | ICD-10-CM | POA: Diagnosis not present

## 2015-11-17 DIAGNOSIS — K625 Hemorrhage of anus and rectum: Secondary | ICD-10-CM | POA: Insufficient documentation

## 2015-11-17 DIAGNOSIS — I2510 Atherosclerotic heart disease of native coronary artery without angina pectoris: Secondary | ICD-10-CM | POA: Diagnosis not present

## 2015-11-17 DIAGNOSIS — K2970 Gastritis, unspecified, without bleeding: Secondary | ICD-10-CM | POA: Diagnosis not present

## 2015-11-17 DIAGNOSIS — K921 Melena: Secondary | ICD-10-CM | POA: Diagnosis not present

## 2015-11-17 DIAGNOSIS — Z8619 Personal history of other infectious and parasitic diseases: Secondary | ICD-10-CM | POA: Insufficient documentation

## 2015-11-17 LAB — COMPREHENSIVE METABOLIC PANEL
ALT: 10 U/L — ABNORMAL LOW (ref 14–54)
AST: 19 U/L (ref 15–41)
Albumin: 4.4 g/dL (ref 3.5–5.0)
Alkaline Phosphatase: 97 U/L (ref 38–126)
Anion gap: 11 (ref 5–15)
BUN: 12 mg/dL (ref 6–20)
CO2: 25 mmol/L (ref 22–32)
Calcium: 10 mg/dL (ref 8.9–10.3)
Chloride: 104 mmol/L (ref 101–111)
Creatinine, Ser: 0.84 mg/dL (ref 0.44–1.00)
GFR calc Af Amer: >60 mL/min (ref >60)
GFR calc non Af Amer: >60 mL/min (ref >60)
Glucose, Bld: 100 mg/dL — ABNORMAL HIGH (ref 65–99)
Potassium: 3.8 mmol/L (ref 3.5–5.1)
Sodium: 140 mmol/L (ref 135–145)
Total Bilirubin: 0.7 mg/dL (ref 0.3–1.2)
Total Protein: 7.8 g/dL (ref 6.5–8.1)

## 2015-11-17 LAB — CBC WITH DIFFERENTIAL/PLATELET
Basophils Absolute: 0 10*3/uL (ref 0.0–0.1)
Basophils Relative: 0 %
Eosinophils Absolute: 0.1 10*3/uL (ref 0.0–0.7)
Eosinophils Relative: 1 %
HCT: 43.3 % (ref 36.0–46.0)
Hemoglobin: 14.3 g/dL (ref 12.0–15.0)
Lymphocytes Relative: 26 %
Lymphs Abs: 2.7 10*3/uL (ref 0.7–4.0)
MCH: 29.7 pg (ref 26.0–34.0)
MCHC: 33 g/dL (ref 30.0–36.0)
MCV: 89.8 fL (ref 78.0–100.0)
Monocytes Absolute: 0.7 10*3/uL (ref 0.1–1.0)
Monocytes Relative: 7 %
Neutro Abs: 6.8 10*3/uL (ref 1.7–7.7)
Neutrophils Relative %: 66 %
Platelets: 382 10*3/uL (ref 150–400)
RBC: 4.82 MIL/uL (ref 3.87–5.11)
RDW: 13.3 % (ref 11.5–15.5)
WBC: 10.3 10*3/uL (ref 4.0–10.5)

## 2015-11-17 LAB — POC OCCULT BLOOD, ED: Fecal Occult Bld: POSITIVE — AB

## 2015-11-17 LAB — LIPASE, BLOOD: Lipase: 30 U/L (ref 11–51)

## 2015-11-17 LAB — I-STAT CG4 LACTIC ACID, ED: Lactic Acid, Venous: 1.23 mmol/L (ref 0.5–2.0)

## 2015-11-17 MED ORDER — MORPHINE SULFATE (PF) 2 MG/ML IV SOLN
2.0000 mg | Freq: Once | INTRAVENOUS | Status: AC
  Administered 2015-11-17: 2 mg via INTRAVENOUS
  Filled 2015-11-17: qty 1

## 2015-11-17 MED ORDER — IOHEXOL 300 MG/ML  SOLN
100.0000 mL | Freq: Once | INTRAMUSCULAR | Status: AC | PRN
  Administered 2015-11-17: 100 mL via INTRAVENOUS

## 2015-11-17 MED ORDER — PROMETHAZINE HCL 25 MG/ML IJ SOLN
12.5000 mg | Freq: Once | INTRAMUSCULAR | Status: AC
  Administered 2015-11-17: 12.5 mg via INTRAVENOUS
  Filled 2015-11-17: qty 1

## 2015-11-17 MED ORDER — IOHEXOL 300 MG/ML  SOLN: 25.0000 mL | Freq: Once | INTRAMUSCULAR | Status: DC | PRN

## 2015-11-17 NOTE — Discharge Instructions (Signed)
Abdominal Pain, Adult Many things can cause abdominal pain. Usually, abdominal pain is not caused by a disease and will improve without treatment. It can often be observed and treated at home. Your health care provider will do a physical exam and possibly order blood tests and X-rays to help determine the seriousness of your pain. However, in many cases, more time must pass before a clear cause of the pain can be found. Before that point, your health care provider may not know if you need more testing or further treatment. HOME CARE INSTRUCTIONS Monitor your abdominal pain for any changes. The following actions may help to alleviate any discomfort you are experiencing:  Only take over-the-counter or prescription medicines as directed by your health care provider.  Do not take laxatives unless directed to do so by your health care provider.  Try a clear liquid diet (broth, tea, or water) as directed by your health care provider. Slowly move to a bland diet as tolerated. SEEK MEDICAL CARE IF:  You have unexplained abdominal pain.  You have abdominal pain associated with nausea or diarrhea.  You have pain when you urinate or have a bowel movement.  You experience abdominal pain that wakes you in the night.  You have abdominal pain that is worsened or improved by eating food.  You have abdominal pain that is worsened with eating fatty foods.  You have a fever. SEEK IMMEDIATE MEDICAL CARE IF:  Your pain does not go away within 2 hours.  You keep throwing up (vomiting).  Your pain is felt only in portions of the abdomen, such as the right side or the left lower portion of the abdomen.  You pass bloody or black tarry stools. MAKE SURE YOU:  Understand these instructions.  Will watch your condition.  Will get help right away if you are not doing well or get worse.   This information is not intended to replace advice given to you by your health care provider. Make sure you discuss  any questions you have with your health care provider.   Document Released: 09/03/2005 Document Revised: 08/15/2015 Document Reviewed: 08/03/2013 Elsevier Interactive Patient Education [employer].  Please continue using previosly prescribed medications for constipation. If any new or worsening signs or symptoms present, please return immediately for further evaluation and management.

## 2015-11-17 NOTE — ED Notes (Signed)
[facility] here to transport pt back to her residence.

## 2015-11-17 NOTE — ED Notes (Signed)
  Pt transported to ct 

## 2015-11-17 NOTE — ED Notes (Signed)
Pt BIB EMS. Pt c/o generalized abdominal pain and distention. Symptoms started today. Pt denies N/V/D. Denies fever, chills. Pt has hx of IBS and Crohn's dz. Pt told EMS she had some bright red blood in her stool this afternoon. Pt has hx of hemorrhoids. Pt alert, no acute distress. Skin warm, dry.

## 2015-11-17 NOTE — ED Notes (Signed)
Bed: DG64 Expected date:  Expected time:  Means of arrival:  Comments: 74's F abd pain/rectal bleeding

## 2015-11-17 NOTE — ED Notes (Signed)
[facility] called for transport home. 

## 2015-11-17 NOTE — ED Provider Notes (Signed)
CSN: [id number]     Arrival date & time 11/17/15  1843 History   First MD Initiated Contact with Patient 11/17/15 1852     Chief Complaint  Patient presents with  . Abdominal Pain  . Rectal Bleeding    HPI   80 year old female presents today with abdominal pain and rectal bleeding. Patient reports that 3 days ago she experienced what she described as "tarry" stools with constipation. Patient reports that she battles with constipation so this is not abnormal other than the dark stools. She states that last night she started to develop left-sided abdominal pain has progressed diffusely throughout her abdomen today. She reports associated distention with the pain. She notes this morning with her bowel movement she had bright red blood along with stool. She denies any loss of blood per rectum since this incident. She notes she is had some nausea last night with vomiting of "flem" . She reports she was able tolerate by mouth today having soup without any vomiting. Patient reports she's had a colonoscopy within the last several years, chart review shows colonoscopy/endoscopy in 2014.   Past Medical History  Diagnosis Date  . ANXIETY 07/23/2009  . DEPRESSION 07/23/2009  . DIVERTICULOSIS, COLON 07/23/2009  . GERD 07/23/2009  . HIP PAIN, LEFT 05/16/2010  . HYPERLIPIDEMIA 07/23/2009  . HYPERTENSION 07/23/2009  . HYPOTHYROIDISM 07/23/2009  . LOW BACK PAIN, CHRONIC 10/01/2009  . PARESTHESIA 10/01/2009  . IBS (irritable bowel syndrome)   . History of Crohn's disease   . Chronic pain   . Hypothyroidism   . Insomnia   . Headache(784.0) 07/23/2009    occasional  . Shingles 2006    back  . Hemorrhoids   . Complication of anesthesia 7 or 8 yrs ago    woke up during colonscopy  . Atherosclerosis of aorta ([place]) 07/24/2015  . Coronary atherosclerosis 07/24/2015   Past Surgical History  Procedure Laterality Date  . Tonsillectomy  yrs ago  . Hemorrhoid surgery  yrs ago  . Appendectomy  yrs ago  .  Bilateral salpingoophorectomy  age 59 or 24  . Abdominal hysterectomy  age 28 or 69  . Colonscopy  7 or 8 yrs ago  . Esophagogastroduodenoscopy N/A 11/17/2013    Procedure: ESOPHAGOGASTRODUODENOSCOPY (EGD);  Surgeon: Cleotis Nipper, MD;  Location: [facility];  Service: Endoscopy;  Laterality: N/A;  . Colonoscopy N/A 11/17/2013    Procedure: COLONOSCOPY;  Surgeon: Cleotis Nipper, MD;  Location: [facility];  Service: Endoscopy;  Laterality: N/A;   Family History  Problem Relation Age of Onset  . Clotting disorder Neg Hx   . COPD Sister   . Cancer Sister     Breast  . Cancer Mother     Unknown type   Social History  Substance Use Topics  . Smoking status: Never Smoker   . Smokeless tobacco: Never Used  . Alcohol Use: No   OB History    No data available     Review of Systems  All other systems reviewed and are negative.   Allergies  Symbicort; Duloxetine; Metronidazole; and Rofecoxib  Home Medications   Prior to Admission medications   Medication Sig Start Date End Date Taking? Authorizing Provider  acetaminophen (TYLENOL) 325 MG tablet Take 2 tablets (650 mg total) by mouth every 6 (six) hours as needed for mild pain (or Fever >/= 101). 07/25/15   Robbie Lis, MD  Albuterol Sulfate (PROAIR RESPICLICK) 825 (90 BASE) MCG/ACT AEPB Inhale 2 puffs into the lungs every  6 (six) hours as needed. 08/28/15   [name], MD  alum & mag hydroxide-simeth (MAALOX/MYLANTA) 200-200-20 MG/5ML suspension Take 30 mLs by mouth every 6 (six) hours as needed for indigestion or heartburn (dyspepsia). Patient not taking: Reported on 10/12/2015 07/25/15   Robbie Lis, MD  azithromycin [facility]) 250 MG tablet As directed 10/12/15   Laurey Morale, MD  benzonatate (TESSALON) 100 MG capsule Take 1 capsule (100 mg total) by mouth 2 (two) times daily as needed for cough. 09/24/15   [name], MD  Cholecalciferol 2000 UNITS TABS Take 2,000 Units by mouth daily.    Historical  Provider, MD  clorazepate (TRANXENE) 7.5 MG tablet Take 1 tablet (7.5 mg total) by mouth 2 (two) times daily as needed for anxiety. 10/08/15   [name], MD  Dextromethorphan-Guaifenesin [facility] DM PO) Take 1 tablet by mouth every 12 (twelve) hours as needed (for cold).     Historical Provider, MD  diltiazem (CARDIZEM CD) 240 MG 24 hr capsule TAKE (1) CAPSULE DAILY. 11/12/15   [name], MD  feeding supplement, ENSURE ENLIVE, (ENSURE ENLIVE) LIQD Take 237 mLs by mouth 2 (two) times daily between meals. 07/25/15   Robbie Lis, MD  levothyroxine (SYNTHROID, LEVOTHROID) 25 MCG tablet TAKE 1 TABLET DAILY BEFORE BREAKFAST. 08/28/15   [name], MD  NEXIUM 40 MG capsule TAKE 1 CAPSULE DAILY BEFORE BREAKFAST. 08/28/15   [name], MD  Polyethyl Glycol-Propyl Glycol (SYSTANE OP) Place 1 drop into both eyes 2 (two) times daily as needed (for dry eyes).     Historical Provider, MD  promethazine (PHENERGAN) 25 MG tablet TAKE 1 TABLET EVERY 8 HOURS AS NEEDED FOR NAUSEA. 10/23/15   [name], MD  sertraline (ZOLOFT) 25 MG tablet TAKE 1 TABLET EACH DAY. 08/28/15   [name], MD  tiotropium (SPIRIVA HANDIHALER) 18 MCG inhalation capsule Place 1 capsule (18 mcg total) into inhaler and inhale daily. 09/04/15   [name], MD  traMADol (ULTRAM) 50 MG tablet TAKE 1 TABLET EVERY SIX HOURS AS NEEDED FOR PAIN. 11/12/15   [name], MD  traZODone (DESYREL) 50 MG tablet TAKE 1/2 TO 1 TABLET AT BEDTIME AS NEEDED FOR REST. 11/14/15   [name], MD  zolpidem (AMBIEN) 5 MG tablet Take 5 mg by mouth at bedtime as needed.  08/28/15   Historical Provider, MD   BP 155/78 mmHg  Pulse 69  Temp(Src) 98.2 F (36.8 C) (Oral)  Resp 18  SpO2 93%   Physical Exam  Constitutional: She is oriented to person, place, and time. She appears well-developed and well-nourished.  HENT:  Head: Normocephalic and atraumatic.  Eyes: Conjunctivae are normal.  Pupils are equal, round, and reactive to light. Right eye exhibits no discharge. Left eye exhibits no discharge. No scleral icterus.  Neck: Normal range of motion. No JVD present. No tracheal deviation present.  Pulmonary/Chest: Effort normal. No stridor.  Abdominal: Soft. Bowel sounds are normal. She exhibits distension. She exhibits no mass. There is tenderness. There is guarding. There is no rebound.  Genitourinary: Rectal exam shows external hemorrhoid and internal hemorrhoid. Guaiac positive stool.  Musculoskeletal: She exhibits no edema or tenderness.  Neurological: She is alert and oriented to person, place, and time. Coordination normal.  Psychiatric: She has a normal mood and affect. Her behavior is normal. Judgment and thought content normal.  Nursing note and vitals reviewed.     ED Course  Procedures (including critical  care time) Labs Review Labs Reviewed  COMPREHENSIVE METABOLIC PANEL - Abnormal; Notable for the following:    Glucose, Bld 100 (*)    ALT 10 (*)    All other components within normal limits  POC OCCULT BLOOD, ED - Abnormal; Notable for the following:    Fecal Occult Bld POSITIVE (*)    All other components within normal limits  CBC WITH DIFFERENTIAL/PLATELET  LIPASE, BLOOD  I-STAT CG4 LACTIC ACID, ED    Imaging Review Ct Abdomen Pelvis W Contrast  11/17/2015  CLINICAL DATA:  Generalized abdominal pain and distension. Symptom onset today. EXAM: CT ABDOMEN AND PELVIS WITH CONTRAST TECHNIQUE: Multidetector CT imaging of the abdomen and pelvis was performed using the standard protocol following bolus administration of intravenous contrast. CONTRAST:  153m OMNIPAQUE IOHEXOL 300 MG/ML  SOLN COMPARISON:  CT 08/22/2014 FINDINGS: Lower chest: Minimal subsegmental atelectasis the lung bases, right greater than left Liver: Normal in size.  No focal lesion. Hepatobiliary: Gallbladder physiologically distended. Pre gross question gallstone is not definitively seen.  Stable biliary prominence from prior. Pancreas: No ductal dilatation or inflammation. Spleen: Normal. Adrenal glands: No nodule. Kidneys: Symmetric renal enhancement.  No hydronephrosis. Stomach/Bowel: Stomach physiologically distended. There are no dilated or thickened small bowel loops. Extensive colonic diverticulosis without diverticulitis. No bowel inflammation. The descending colon is decompressed, the previous questioned luminal narrowing in the descending colon cannot be assessed. Small volume of stool throughout the colon without colonic wall thickening. The appendix is not visualized. Vascular/Lymphatic: No retroperitoneal adenopathy. Abdominal aorta is normal in caliber. Moderate atherosclerosis without aneurysm. There is a retro aortic left renal vein. Reproductive: Uterus surgically absent.  No adnexal mass. Bladder: Mildly distended, no wall thickening. Other: No free air, free fluid, or intra-abdominal fluid collection. Tiny fat containing umbilical hernia. Musculoskeletal: There are no acute or suspicious osseous abnormalities. Stable bone island and L3. IMPRESSION: 1. No acute abnormality in the abdomen/pelvis. 2. Colonic diverticulosis without diverticulitis. No bowel inflammation. Electronically Signed   By: MJeb LeveringM.D.   On: 11/17/2015 21:34   Dg Abd Acute W/chest  11/17/2015  CLINICAL DATA:  Left lower quadrant abdominal pain with dark red blood in stool yesterday and bright red blood today EXAM: DG ABDOMEN ACUTE W/ 1V CHEST COMPARISON:  None. FINDINGS: The heart size and vascular pattern normal. Lungs are clear. There is no free air. There are no abnormally dilated loops of bowel. There are no abnormal opacities over the abdomen. IMPRESSION: Nonobstructive gas pattern with no acute findings over the thorax or abdomen. Electronically Signed   By: RSkipper ClicheM.D.   On: 11/17/2015 19:55   I have personally reviewed and evaluated these images and lab results as part of my  medical decision-making.   EKG Interpretation None      MDM   Final diagnoses:  Generalized abdominal pain  Rectal bleeding    Labs: I-STAT lactic acid, CBC, CMP, lipase, 20 care occult blood- occult blood positive  Imaging: DG abdomen acute with chest, CT abdomen and pelvis- stomach physiologically distended no other acute findings  Consults:  Therapeutics: Morphine, Phenergan  Discharge Meds:   Assessment/Plan: Patient presents with abdominal pain and rectal bleeding. CT scan shows no acute findings that would necessitate further evaluation or management here in the ED. Patient was treated with morphine here in the ED with drastic improvement in her abdominal discomfort. Repeat abdominal exam showed a soft nontender abdomen. Patient was tolerating by mouth without difficulty urine the ED. Patient's rectal exam  showed no frank blood, no significant stool present in the rectal vault. Rectal bleeding today likely due to constipation with bleeding from internal hemorrhoids. Patient has reassuring vital signs, laboratory data, diagnostic imaging here in the ED. She will be discharged home with instructions to return immediately if any new or worsening signs or symptoms present. She does have in-home health with RNs checking on her daily, with a follow-up appointment with her primary care in 1 day. She is encouraged to follow-up as previously scheduled, return immediately if needed. She verbalized understanding and agreement for today's plan and had no further questions or concerns.   Patient care was shared with [facility] M.D. who personally evaluated the patient and agreed to my assessment plan        Okey Regal, PA-C 11/18/15 0005  Blanchie Dessert, MD 11/19/15 [phone]

## 2015-11-18 DIAGNOSIS — R791 Abnormal coagulation profile: Secondary | ICD-10-CM | POA: Diagnosis not present

## 2015-11-18 DIAGNOSIS — R1084 Generalized abdominal pain: Secondary | ICD-10-CM | POA: Diagnosis not present

## 2015-11-19 DIAGNOSIS — G8929 Other chronic pain: Secondary | ICD-10-CM | POA: Diagnosis not present

## 2015-11-19 DIAGNOSIS — F329 Major depressive disorder, single episode, unspecified: Secondary | ICD-10-CM | POA: Diagnosis not present

## 2015-11-19 DIAGNOSIS — I2510 Atherosclerotic heart disease of native coronary artery without angina pectoris: Secondary | ICD-10-CM | POA: Diagnosis not present

## 2015-11-19 DIAGNOSIS — I10 Essential (primary) hypertension: Secondary | ICD-10-CM | POA: Diagnosis not present

## 2015-11-19 DIAGNOSIS — J439 Emphysema, unspecified: Secondary | ICD-10-CM | POA: Diagnosis not present

## 2015-11-19 DIAGNOSIS — F419 Anxiety disorder, unspecified: Secondary | ICD-10-CM | POA: Diagnosis not present

## 2015-11-20 DIAGNOSIS — F419 Anxiety disorder, unspecified: Secondary | ICD-10-CM | POA: Diagnosis not present

## 2015-11-20 DIAGNOSIS — G8929 Other chronic pain: Secondary | ICD-10-CM | POA: Diagnosis not present

## 2015-11-20 DIAGNOSIS — I10 Essential (primary) hypertension: Secondary | ICD-10-CM | POA: Diagnosis not present

## 2015-11-20 DIAGNOSIS — I2510 Atherosclerotic heart disease of native coronary artery without angina pectoris: Secondary | ICD-10-CM | POA: Diagnosis not present

## 2015-11-20 DIAGNOSIS — F329 Major depressive disorder, single episode, unspecified: Secondary | ICD-10-CM | POA: Diagnosis not present

## 2015-11-20 DIAGNOSIS — J439 Emphysema, unspecified: Secondary | ICD-10-CM | POA: Diagnosis not present

## 2015-11-21 DIAGNOSIS — G8929 Other chronic pain: Secondary | ICD-10-CM | POA: Diagnosis not present

## 2015-11-21 DIAGNOSIS — F329 Major depressive disorder, single episode, unspecified: Secondary | ICD-10-CM | POA: Diagnosis not present

## 2015-11-21 DIAGNOSIS — I10 Essential (primary) hypertension: Secondary | ICD-10-CM | POA: Diagnosis not present

## 2015-11-21 DIAGNOSIS — I2510 Atherosclerotic heart disease of native coronary artery without angina pectoris: Secondary | ICD-10-CM | POA: Diagnosis not present

## 2015-11-21 DIAGNOSIS — J439 Emphysema, unspecified: Secondary | ICD-10-CM | POA: Diagnosis not present

## 2015-11-21 DIAGNOSIS — F419 Anxiety disorder, unspecified: Secondary | ICD-10-CM | POA: Diagnosis not present

## 2015-11-22 ENCOUNTER — Telehealth: Payer: Self-pay | Admitting: Internal Medicine

## 2015-11-22 DIAGNOSIS — I2510 Atherosclerotic heart disease of native coronary artery without angina pectoris: Secondary | ICD-10-CM | POA: Diagnosis not present

## 2015-11-22 DIAGNOSIS — I10 Essential (primary) hypertension: Secondary | ICD-10-CM | POA: Diagnosis not present

## 2015-11-22 DIAGNOSIS — G8929 Other chronic pain: Secondary | ICD-10-CM | POA: Diagnosis not present

## 2015-11-22 DIAGNOSIS — F329 Major depressive disorder, single episode, unspecified: Secondary | ICD-10-CM | POA: Diagnosis not present

## 2015-11-22 DIAGNOSIS — F419 Anxiety disorder, unspecified: Secondary | ICD-10-CM | POA: Diagnosis not present

## 2015-11-22 DIAGNOSIS — J439 Emphysema, unspecified: Secondary | ICD-10-CM | POA: Diagnosis not present

## 2015-11-22 NOTE — Telephone Encounter (Signed)
Danielle from [facility] called to receive permission for an extension of PT services for Heather [name]. She wants to have sessions with her twice a week for three weeks and then discharge. Please give her a call regarding this.  Danielle's ph# [phone] Thank you.

## 2015-11-22 NOTE — Telephone Encounter (Signed)
Arbie Cookey from [place] call to ask for VERBAL to do a  PRN on this pt. Pt has been having bloody stool. Arbie Cookey would like a call back before noon today    [phone]

## 2015-11-23 DIAGNOSIS — I10 Essential (primary) hypertension: Secondary | ICD-10-CM | POA: Diagnosis not present

## 2015-11-23 DIAGNOSIS — F419 Anxiety disorder, unspecified: Secondary | ICD-10-CM | POA: Diagnosis not present

## 2015-11-23 DIAGNOSIS — J439 Emphysema, unspecified: Secondary | ICD-10-CM | POA: Diagnosis not present

## 2015-11-23 DIAGNOSIS — G8929 Other chronic pain: Secondary | ICD-10-CM | POA: Diagnosis not present

## 2015-11-23 DIAGNOSIS — F329 Major depressive disorder, single episode, unspecified: Secondary | ICD-10-CM | POA: Diagnosis not present

## 2015-11-23 DIAGNOSIS — I2510 Atherosclerotic heart disease of native coronary artery without angina pectoris: Secondary | ICD-10-CM | POA: Diagnosis not present

## 2015-11-23 NOTE — Telephone Encounter (Signed)
Spoke with Arbie Cookey and give her the [facility]

## 2015-11-23 NOTE — Telephone Encounter (Signed)
Spoke to [place] from [place] and she is aware

## 2015-11-23 NOTE — Telephone Encounter (Signed)
Ok patient needs medical evaluation for rectal bleeding

## 2015-11-26 DIAGNOSIS — J439 Emphysema, unspecified: Secondary | ICD-10-CM | POA: Diagnosis not present

## 2015-11-26 DIAGNOSIS — F329 Major depressive disorder, single episode, unspecified: Secondary | ICD-10-CM | POA: Diagnosis not present

## 2015-11-26 DIAGNOSIS — F419 Anxiety disorder, unspecified: Secondary | ICD-10-CM | POA: Diagnosis not present

## 2015-11-26 DIAGNOSIS — I10 Essential (primary) hypertension: Secondary | ICD-10-CM | POA: Diagnosis not present

## 2015-11-26 DIAGNOSIS — G8929 Other chronic pain: Secondary | ICD-10-CM | POA: Diagnosis not present

## 2015-11-26 DIAGNOSIS — I2510 Atherosclerotic heart disease of native coronary artery without angina pectoris: Secondary | ICD-10-CM | POA: Diagnosis not present

## 2015-11-28 DIAGNOSIS — I10 Essential (primary) hypertension: Secondary | ICD-10-CM | POA: Diagnosis not present

## 2015-11-28 DIAGNOSIS — F329 Major depressive disorder, single episode, unspecified: Secondary | ICD-10-CM | POA: Diagnosis not present

## 2015-11-28 DIAGNOSIS — G8929 Other chronic pain: Secondary | ICD-10-CM | POA: Diagnosis not present

## 2015-11-28 DIAGNOSIS — J439 Emphysema, unspecified: Secondary | ICD-10-CM | POA: Diagnosis not present

## 2015-11-28 DIAGNOSIS — F419 Anxiety disorder, unspecified: Secondary | ICD-10-CM | POA: Diagnosis not present

## 2015-11-28 DIAGNOSIS — I2510 Atherosclerotic heart disease of native coronary artery without angina pectoris: Secondary | ICD-10-CM | POA: Diagnosis not present

## 2015-12-04 DIAGNOSIS — J439 Emphysema, unspecified: Secondary | ICD-10-CM | POA: Diagnosis not present

## 2015-12-04 DIAGNOSIS — I10 Essential (primary) hypertension: Secondary | ICD-10-CM | POA: Diagnosis not present

## 2015-12-04 DIAGNOSIS — I2510 Atherosclerotic heart disease of native coronary artery without angina pectoris: Secondary | ICD-10-CM | POA: Diagnosis not present

## 2015-12-04 DIAGNOSIS — G8929 Other chronic pain: Secondary | ICD-10-CM | POA: Diagnosis not present

## 2015-12-04 DIAGNOSIS — F329 Major depressive disorder, single episode, unspecified: Secondary | ICD-10-CM | POA: Diagnosis not present

## 2015-12-04 DIAGNOSIS — F419 Anxiety disorder, unspecified: Secondary | ICD-10-CM | POA: Diagnosis not present

## 2015-12-06 DIAGNOSIS — I10 Essential (primary) hypertension: Secondary | ICD-10-CM | POA: Diagnosis not present

## 2015-12-06 DIAGNOSIS — J439 Emphysema, unspecified: Secondary | ICD-10-CM | POA: Diagnosis not present

## 2015-12-06 DIAGNOSIS — F419 Anxiety disorder, unspecified: Secondary | ICD-10-CM | POA: Diagnosis not present

## 2015-12-06 DIAGNOSIS — I2510 Atherosclerotic heart disease of native coronary artery without angina pectoris: Secondary | ICD-10-CM | POA: Diagnosis not present

## 2015-12-06 DIAGNOSIS — G8929 Other chronic pain: Secondary | ICD-10-CM | POA: Diagnosis not present

## 2015-12-06 DIAGNOSIS — F329 Major depressive disorder, single episode, unspecified: Secondary | ICD-10-CM | POA: Diagnosis not present

## 2015-12-07 ENCOUNTER — Telehealth: Payer: Self-pay | Admitting: Internal Medicine

## 2015-12-07 DIAGNOSIS — F329 Major depressive disorder, single episode, unspecified: Secondary | ICD-10-CM | POA: Diagnosis not present

## 2015-12-07 DIAGNOSIS — I2510 Atherosclerotic heart disease of native coronary artery without angina pectoris: Secondary | ICD-10-CM | POA: Diagnosis not present

## 2015-12-07 DIAGNOSIS — F419 Anxiety disorder, unspecified: Secondary | ICD-10-CM | POA: Diagnosis not present

## 2015-12-07 DIAGNOSIS — I10 Essential (primary) hypertension: Secondary | ICD-10-CM | POA: Diagnosis not present

## 2015-12-07 DIAGNOSIS — J439 Emphysema, unspecified: Secondary | ICD-10-CM | POA: Diagnosis not present

## 2015-12-07 DIAGNOSIS — G8929 Other chronic pain: Secondary | ICD-10-CM | POA: Diagnosis not present

## 2015-12-07 NOTE — Telephone Encounter (Signed)
Spoke with pt and she doing better. She said if she start having pain or see blood in her stool she would go to ED. Pt has an appt on Tuesday

## 2015-12-07 NOTE — Telephone Encounter (Signed)
Ms. Scherzinger called saying this week she's had blood in her stool and is experiencing stomach pains. She was instructed to call 911 but refused to do so. She'd rather see Dr. Raliegh Ip. She said today she's not seen blood in her stool. At one point her home health nurse saw that her stool was black and suggested she be seen. Please give her a phone call if we can work her in next week.   Pt's ph# [phone] Thank you.

## 2015-12-11 ENCOUNTER — Encounter: Payer: Self-pay | Admitting: Internal Medicine

## 2015-12-11 ENCOUNTER — Ambulatory Visit (INDEPENDENT_AMBULATORY_CARE_PROVIDER_SITE_OTHER): Payer: Medicare Other | Admitting: Internal Medicine

## 2015-12-11 VITALS — BP 124/70 | HR 79 | Temp 98.4°F | Resp 18 | Ht 59.0 in | Wt 116.0 lb

## 2015-12-11 DIAGNOSIS — I10 Essential (primary) hypertension: Secondary | ICD-10-CM | POA: Diagnosis not present

## 2015-12-11 DIAGNOSIS — E039 Hypothyroidism, unspecified: Secondary | ICD-10-CM

## 2015-12-11 DIAGNOSIS — K625 Hemorrhage of anus and rectum: Secondary | ICD-10-CM

## 2015-12-11 DIAGNOSIS — R1030 Lower abdominal pain, unspecified: Secondary | ICD-10-CM

## 2015-12-11 DIAGNOSIS — F418 Other specified anxiety disorders: Secondary | ICD-10-CM | POA: Diagnosis not present

## 2015-12-11 DIAGNOSIS — F419 Anxiety disorder, unspecified: Secondary | ICD-10-CM

## 2015-12-11 DIAGNOSIS — F329 Major depressive disorder, single episode, unspecified: Secondary | ICD-10-CM

## 2015-12-11 LAB — CBC WITH DIFFERENTIAL/PLATELET
Basophils Absolute: 0 10*3/uL (ref 0.0–0.1)
Basophils Relative: 0.2 % (ref 0.0–3.0)
Eosinophils Absolute: 0.1 10*3/uL (ref 0.0–0.7)
Eosinophils Relative: 1.2 % (ref 0.0–5.0)
HCT: 40.8 % (ref 36.0–46.0)
Hemoglobin: 13.2 g/dL (ref 12.0–15.0)
Lymphocytes Relative: 27.2 % (ref 12.0–46.0)
Lymphs Abs: 2.6 10*3/uL (ref 0.7–4.0)
MCHC: 32.3 g/dL (ref 30.0–36.0)
MCV: 90.4 fl (ref 78.0–100.0)
Monocytes Absolute: 0.7 10*3/uL (ref 0.1–1.0)
Monocytes Relative: 6.9 % (ref 3.0–12.0)
Neutro Abs: 6.2 10*3/uL (ref 1.4–7.7)
Neutrophils Relative %: 64.5 % (ref 43.0–77.0)
Platelets: 383 10*3/uL (ref 150.0–400.0)
RBC: 4.51 Mil/uL (ref 3.87–5.11)
RDW: 14.9 % (ref 11.5–15.5)
WBC: 9.5 10*3/uL (ref 4.0–10.5)

## 2015-12-11 LAB — COMPREHENSIVE METABOLIC PANEL
ALT: 8 U/L (ref 0–35)
AST: 12 U/L (ref 0–37)
Albumin: 4.3 g/dL (ref 3.5–5.2)
Alkaline Phosphatase: 91 U/L (ref 39–117)
BUN: 11 mg/dL (ref 6–23)
CO2: 31 mEq/L (ref 19–32)
Calcium: 10 mg/dL (ref 8.4–10.5)
Chloride: 103 mEq/L (ref 96–112)
Creatinine, Ser: 0.89 mg/dL (ref 0.40–1.20)
GFR: 65.12 mL/min (ref >60.00)
Glucose, Bld: 92 mg/dL (ref 70–99)
Potassium: 4.8 mEq/L (ref 3.5–5.1)
Sodium: 143 mEq/L (ref 135–145)
Total Bilirubin: 0.4 mg/dL (ref 0.2–1.2)
Total Protein: 7.1 g/dL (ref 6.0–8.3)

## 2015-12-11 NOTE — Patient Instructions (Signed)
High-Fiber Diet Fiber, also called dietary fiber, is a type of carbohydrate found in fruits, vegetables, whole grains, and beans. A high-fiber diet can have many health benefits. Your health care provider may recommend a high-fiber diet to help:  Prevent constipation. Fiber can make your bowel movements more regular.  Lower your cholesterol.  Relieve hemorrhoids, uncomplicated diverticulosis, or irritable bowel syndrome.  Prevent overeating as part of a weight-loss plan.  Prevent heart disease, type 2 diabetes, and certain cancers. WHAT IS MY PLAN? The recommended daily intake of fiber includes:  38 grams for men under age 27.  97 grams for men over age 77.  70 grams for women under age 31.  7 grams for women over age 62. You can get the recommended daily intake of dietary fiber by eating a variety of fruits, vegetables, grains, and beans. Your health care provider may also recommend a fiber supplement if it is not possible to get enough fiber through your diet. WHAT DO I NEED TO KNOW ABOUT A HIGH-FIBER DIET?  Fiber supplements have not been widely studied for their effectiveness, so it is better to get fiber through food sources.  Always check the fiber content on thenutrition facts label of any prepackaged food. Look for foods that contain at least 5 grams of fiber per serving.  Ask your dietitian if you have questions about specific foods that are related to your condition, especially if those foods are not listed in the following section.  Increase your daily fiber consumption gradually. Increasing your intake of dietary fiber too quickly may cause bloating, cramping, or gas.  Drink plenty of water. Water helps you to digest fiber. WHAT FOODS CAN I EAT? Grains Whole-grain breads. Multigrain cereal. Oats and oatmeal. Brown rice. Barley. Bulgur wheat. [place]. Bran muffins. Popcorn. Rye wafer crackers. Vegetables Sweet potatoes. Spinach. Kale. Artichokes. Cabbage. Broccoli.  Green peas. Carrots. Squash. Fruits Berries. Pears. Apples. Oranges. Avocados. Prunes and raisins. Dried figs. Meats and Other Protein Sources Navy, kidney, pinto, and soy beans. Split peas. Lentils. Nuts and seeds. Dairy Fiber-fortified yogurt. Beverages Fiber-fortified soy milk. Fiber-fortified orange juice. Other Fiber bars. The items listed above may not be a complete list of recommended foods or beverages. Contact your dietitian for more options. WHAT FOODS ARE NOT RECOMMENDED? Grains White bread. Pasta made with refined flour. White rice. Vegetables Fried potatoes. Canned vegetables. Well-cooked vegetables.  Fruits Fruit juice. Cooked, strained fruit. Meats and Other Protein Sources Fatty cuts of meat. Fried Sales executive or fried fish. Dairy Milk. Yogurt. Cream cheese. Sour cream. Beverages Soft drinks. Other Cakes and pastries. Butter and oils. The items listed above may not be a complete list of foods and beverages to avoid. Contact your dietitian for more information. WHAT ARE SOME TIPS FOR INCLUDING HIGH-FIBER FOODS IN MY DIET?  Eat a wide variety of high-fiber foods.  Make sure that half of all grains consumed each day are whole grains.  Replace breads and cereals made from refined flour or white flour with whole-grain breads and cereals.  Replace white rice with brown rice, bulgur wheat, or millet.  Start the day with a breakfast that is high in fiber, such as a cereal that contains at least 5 grams of fiber per serving.  Use beans in place of meat in soups, salads, or pasta.  Eat high-fiber snacks, such as berries, raw vegetables, nuts, or popcorn.   This information is not intended to replace advice given to you by your health care provider. Make sure you discuss  any questions you have with your health care provider.   Document Released: 11/24/2005 Document Revised: 12/15/2014 Document Reviewed: 05/09/2014 Elsevier Interactive Patient Education 2016 Elsevier  Inc. Psyllium granules or powder for solution What is this medicine? PSYLLIUM (SIL i yum) is a bulk-forming fiber laxative. This medicine is used to treat constipation. This medicine may be used for other purposes; ask your health care provider or pharmacist if you have questions. What should I tell my health care provider before I take this medicine? They need to know if you have any of these conditions: -change in bowel habits for more than 14 days -blocked intestines or bowel -phenylketonuria -stomach pain, nausea, or vomiting -trouble swallowing -an unusual or allergic reaction to psyllium, tartrazine dye, other medicines, dyes, or preservatives -pregnant or trying or get pregnant -breast-feeding How should I use this medicine? Mix this medicine into a full glass of water or other drink. Take by mouth. Follow the directions on the package labeling, or take as directed by your health care professional. Mix this medicine with 8 ounces or more of fluid. If the drink thickens add more fluid before drinking. Take your medicine at regular intervals. Do not take your medicine more often than directed. Talk to your pediatrician regarding the use of this medicine in children. While this drug may be prescribed for children as young as 69 years old for selected conditions, precautions do apply. Overdosage: If you think you have taken too much of this medicine contact a poison control center or emergency room at once. NOTE: This medicine is only for you. Do not share this medicine with others. What if I miss a dose? If you miss a dose, take it as soon as you can. If it is almost time for your next dose, take only that dose. Do not take double or extra doses. What may interact with this medicine? Interactions are not expected. This list may not describe all possible interactions. Give your health care provider a list of all the medicines, herbs, non-prescription drugs, or dietary supplements you use.  Also tell them if you smoke, drink alcohol, or use illegal drugs. Some items may interact with your medicine. What should I watch for while using this medicine? This medicine can take up to 3 days to work. Check with your doctor or health care professional if your symptoms do not start to get better or if they get worse. See your doctor if you have to treat your constipation for more than 1 week. Avoid taking other medicines within 2 hours of taking this medicine. Drink several glasses of water a day while you are taking this medicine. This will help to relieve constipation and prevent dehydration. Avoid breathing in this medicine. This can cause an allergic reaction or make it difficult to breathe. What side effects may I notice from receiving this medicine? Side effects that you should report to your doctor or health care professional as soon as possible: -allergic reactions like skin rash, itching or hives, swelling of the face, lips, or tongue -breathing problems -chest pain -nausea, vomiting -rectal bleeding -trouble swallowing Side effects that usually do not require medical attention (report to your doctor or health care professional if they continue or are bothersome): -bloated or 'gassy' feeling -diarrhea -headache -stomach cramps This list may not describe all possible side effects. Call your doctor for medical advice about side effects. You may report side effects to FDA at 1-800-FDA-1088. Where should I keep my medicine? Keep out of the reach  of children. Store at room temperature between 15 and 30 degrees C (59 and 86 degrees F). Protect from moisture. Throw away any unused medicine after the expiration date. NOTE: This sheet is a summary. It may not cover all possible information. If you have questions about this medicine, talk to your doctor, pharmacist, or health care provider.    2016, Elsevier/Gold Standard. (2008-06-26 16:57:28)  Report any new or worsening symptoms GI  follow-up if unimproved

## 2015-12-11 NOTE — Progress Notes (Signed)
Pre visit review using our clinic review tool, if applicable. No additional management support is needed unless otherwise documented below in the visit note. 

## 2015-12-11 NOTE — Progress Notes (Signed)
Subjective:    Patient ID: Heather [name], female    DOB: June 27, 1935, 81 y.o.   MRN: [id number]  HPI  81 year old patient who presents with the complaints of abdominal pain, distention and rectal bleeding. She was seen in the ED on December 10 with similar complaints.  Abdominal CT scan was negative. She states she has a history of Crohn's disease but according to GI records, she has a history of collagenous colitis.  She is status post the EGD and colonoscopy in December 2014.  She states that she has had intermittent abdominal pain associated with alternating constipation, diarrhea for the past month.  She states approximately 4 weeks ago.  She had an episode of black stool, but over the past 4 months has had occasional bright red rectal bleeding.  She also complains of nausea and vomiting.  She does have assistance at home with [place].  she uses Dulcolax occasionally for constipation.  Issues  Wt Readings from Last 3 Encounters:  12/11/15 116 lb (52.617 kg)  10/12/15 113 lb (51.256 kg)  09/04/15 112 lb (50.803 kg)    Past Medical History  Diagnosis Date  . ANXIETY 07/23/2009  . DEPRESSION 07/23/2009  . DIVERTICULOSIS, COLON 07/23/2009  . GERD 07/23/2009  . HIP PAIN, LEFT 05/16/2010  . HYPERLIPIDEMIA 07/23/2009  . HYPERTENSION 07/23/2009  . HYPOTHYROIDISM 07/23/2009  . LOW BACK PAIN, CHRONIC 10/01/2009  . PARESTHESIA 10/01/2009  . IBS (irritable bowel syndrome)   . History of Crohn's disease   . Chronic pain   . Hypothyroidism   . Insomnia   . Headache(784.0) 07/23/2009    occasional  . Shingles 2006    back  . Hemorrhoids   . Complication of anesthesia 7 or 8 yrs ago    woke up during colonscopy  . Atherosclerosis of aorta ([place]) 07/24/2015  . Coronary atherosclerosis 07/24/2015     Review of Systems  Constitutional: Positive for appetite change.  HENT: Negative for congestion, dental problem, hearing loss, rhinorrhea, sinus pressure, sore throat and tinnitus.   Eyes:  Negative for pain, discharge and visual disturbance.  Respiratory: Negative for cough and shortness of breath.   Cardiovascular: Negative for chest pain, palpitations and leg swelling.  Gastrointestinal: Positive for nausea, abdominal pain, diarrhea, constipation, blood in stool, abdominal distention and anal bleeding. Negative for vomiting.  Genitourinary: Negative for dysuria, urgency, frequency, hematuria, flank pain, vaginal bleeding, vaginal discharge, difficulty urinating, vaginal pain and pelvic pain.  Musculoskeletal: Negative for joint swelling, arthralgias and gait problem.  Skin: Negative for rash.  Neurological: Negative for dizziness, syncope, speech difficulty, weakness, numbness and headaches.  Hematological: Negative for adenopathy.  Psychiatric/Behavioral: Negative for behavioral problems, dysphoric mood and agitation. The patient is not nervous/anxious.        Objective:   Physical Exam  Constitutional: She is oriented to person, place, and time. She appears well-developed and well-nourished.  HENT:  Head: Normocephalic.  Right Ear: External ear normal.  Left Ear: External ear normal.  Mouth/Throat: Oropharynx is clear and moist.  Eyes: Conjunctivae and EOM are normal. Pupils are equal, round, and reactive to light.  Neck: Normal range of motion. Neck supple. No thyromegaly present.  Cardiovascular: Normal rate, regular rhythm, normal heart sounds and intact distal pulses.   Pulmonary/Chest: Effort normal and breath sounds normal.  Abdominal: Soft. Bowel sounds are normal. She exhibits no mass. There is no tenderness. There is no rebound and no guarding.  Rectal exam revealed  hemorrhoidal tags Stool light brown, soft, hematest  negative No impaction  Musculoskeletal: Normal range of motion.  Lymphadenopathy:    She has no cervical adenopathy.  Neurological: She is alert and oriented to person, place, and time.  Skin: Skin is warm and dry. No rash noted.    Psychiatric: She has a normal mood and affect. Her behavior is normal.          Assessment & Plan:   Intermittent abdominal pain, distention, nausea, vomiting, bright red rectal bleeding.  History of colonoscopy December 2014.  History of collagenous colitis Essential hypertension, stable GERD Anxiety disorder  We'll check updated lab.  Continue present symptomatic therapy.  High-fiber diet.  Stressed GI follow-up if unimproved or she develops worsening symptoms

## 2015-12-12 DIAGNOSIS — F419 Anxiety disorder, unspecified: Secondary | ICD-10-CM | POA: Diagnosis not present

## 2015-12-12 DIAGNOSIS — I2510 Atherosclerotic heart disease of native coronary artery without angina pectoris: Secondary | ICD-10-CM | POA: Diagnosis not present

## 2015-12-12 DIAGNOSIS — G8929 Other chronic pain: Secondary | ICD-10-CM | POA: Diagnosis not present

## 2015-12-12 DIAGNOSIS — I10 Essential (primary) hypertension: Secondary | ICD-10-CM | POA: Diagnosis not present

## 2015-12-12 DIAGNOSIS — J439 Emphysema, unspecified: Secondary | ICD-10-CM | POA: Diagnosis not present

## 2015-12-12 DIAGNOSIS — F329 Major depressive disorder, single episode, unspecified: Secondary | ICD-10-CM | POA: Diagnosis not present

## 2015-12-13 DIAGNOSIS — F329 Major depressive disorder, single episode, unspecified: Secondary | ICD-10-CM | POA: Diagnosis not present

## 2015-12-13 DIAGNOSIS — G8929 Other chronic pain: Secondary | ICD-10-CM | POA: Diagnosis not present

## 2015-12-13 DIAGNOSIS — J439 Emphysema, unspecified: Secondary | ICD-10-CM | POA: Diagnosis not present

## 2015-12-13 DIAGNOSIS — I10 Essential (primary) hypertension: Secondary | ICD-10-CM | POA: Diagnosis not present

## 2015-12-13 DIAGNOSIS — F419 Anxiety disorder, unspecified: Secondary | ICD-10-CM | POA: Diagnosis not present

## 2015-12-13 DIAGNOSIS — I2510 Atherosclerotic heart disease of native coronary artery without angina pectoris: Secondary | ICD-10-CM | POA: Diagnosis not present

## 2016-01-08 ENCOUNTER — Encounter: Payer: Self-pay | Admitting: Internal Medicine

## 2016-01-08 ENCOUNTER — Ambulatory Visit (INDEPENDENT_AMBULATORY_CARE_PROVIDER_SITE_OTHER): Payer: Medicare Other | Admitting: Internal Medicine

## 2016-01-08 DIAGNOSIS — E785 Hyperlipidemia, unspecified: Secondary | ICD-10-CM | POA: Diagnosis not present

## 2016-01-08 DIAGNOSIS — E039 Hypothyroidism, unspecified: Secondary | ICD-10-CM

## 2016-01-08 DIAGNOSIS — I10 Essential (primary) hypertension: Secondary | ICD-10-CM | POA: Diagnosis not present

## 2016-01-08 DIAGNOSIS — R1012 Left upper quadrant pain: Secondary | ICD-10-CM | POA: Diagnosis not present

## 2016-01-08 MED ORDER — TRAZODONE HCL 50 MG PO TABS: ORAL_TABLET | ORAL | Status: DC

## 2016-01-08 MED ORDER — PROMETHAZINE HCL 25 MG PO TABS: ORAL_TABLET | ORAL | Status: DC

## 2016-01-08 MED ORDER — ZOLPIDEM TARTRATE 5 MG PO TABS: 5.0000 mg | ORAL_TABLET | Freq: Every evening | ORAL | Status: DC | PRN

## 2016-01-08 NOTE — Patient Instructions (Signed)
Gastroenterology follow-up as discussed  Low residue diet  Low-Fiber Diet Fiber is found in fruits, vegetables, and whole grains. A low-fiber diet restricts fibrous foods that are not digested in the small intestine. A diet containing about 10-15 grams of fiber per day is considered low fiber. Low-fiber diets may be used to:  Promote healing and rest the bowel during intestinal flare-ups.  Prevent blockage of a partially obstructed or narrowed gastrointestinal tract.  Reduce fecal weight and volume.  Slow the movement of feces. You may be on a low-fiber diet as a transitional diet following surgery, after an injury (trauma), or because of a short (acute) or lifelong (chronic) illness. Your health care provider will determine the length of time you need to stay on this diet.  WHAT DO I NEED TO KNOW ABOUT A LOW-FIBER DIET? Always check the fiber content on the packaging's Nutrition Facts label, especially on foods from the grains list. Ask your dietitian if you have questions about specific foods that are related to your condition, especially if the food is not listed below. In general, a low-fiber food will have less than 2 g of fiber. WHAT FOODS CAN I EAT? Grains All breads and crackers made with white flour. Sweet rolls, doughnuts, waffles, pancakes, [place] toast, bagels. Pretzels, Melba toast, zwieback. Well-cooked cereals, such as cornmeal, farina, or cream cereals. Dry cereals that do not contain whole grains, fruit, or nuts, such as refined corn, wheat, rice, and oat cereals. Potatoes prepared any way without skins, plain pastas and noodles, refined white rice. Use white flour for baking and making sauces. Use allowed list of grains for casseroles, dumplings, and puddings.  Vegetables Strained tomato and vegetable juices. Fresh lettuce, cucumber, spinach. Well-cooked (no skin or pulp) or canned vegetables, such as asparagus, bean sprouts, beets, carrots, green beans, mushrooms, potatoes,  pumpkin, spinach, yellow squash, tomato sauce/puree, turnips, yams, and zucchini. Keep servings limited to  cup.  Fruits All fruit juices except prune juice. Cooked or canned fruits without skin and seeds, such as applesauce, apricots, cherries, fruit cocktail, grapefruit, grapes, mandarin oranges, melons, peaches, pears, pineapple, and plums. Fresh fruits without skin, such as apricots, avocados, bananas, melons, pineapple, nectarines, and peaches. Keep servings limited to  cup or 1 piece.  Meat and Other Protein Sources Ground or well-cooked tender beef, ham, veal, lamb, pork, or poultry. Eggs, plain cheese. Fish, oysters, shrimp, lobster, and other seafood. Liver, organ meats. Smooth nut butters. Dairy All milk products and alternative dairy substitutes, such as soy, rice, almond, and coconut, not containing added whole nuts, seeds, or added fruit. Beverages Decaf coffee, fruit, and vegetable juices or smoothies (small amounts, with no pulp or skins, and with fruits from allowed list), sports drinks, herbal tea. Condiments Ketchup, mustard, vinegar, cream sauce, cheese sauce, cocoa powder. Spices in moderation, such as allspice, basil, bay leaves, celery powder or leaves, cinnamon, cumin powder, curry powder, ginger, mace, marjoram, onion or garlic powder, oregano, paprika, parsley flakes, ground pepper, rosemary, sage, savory, tarragon, thyme, and turmeric. Sweets and Desserts Plain cakes and cookies, pie made with allowed fruit, pudding, custard, cream pie. Gelatin, fruit, ice, sherbet, frozen ice pops. Ice cream, ice milk without nuts. Plain hard candy, honey, jelly, molasses, syrup, sugar, chocolate syrup, gumdrops, marshmallows. Limit overall sugar intake.  Fats and Oil Margarine, butter, cream, mayonnaise, salad oils, plain salad dressings made from allowed foods. Choose healthy fats such as olive oil, canola oil, and omega-3 fatty acids (such as found in salmon or tuna) when  possible.   Other Bouillon, broth, or cream soups made from allowed foods. Any strained soup. Casseroles or mixed dishes made with allowed foods. The items listed above may not be a complete list of recommended foods or beverages. Contact your dietitian for more options.  WHAT FOODS ARE NOT RECOMMENDED? Grains All whole wheat and whole grain breads and crackers. Multigrains, rye, bran seeds, nuts, or coconut. Cereals containing whole grains, multigrains, bran, coconut, nuts, raisins. Cooked or dry oatmeal, steel-cut oats. Coarse wheat cereals, granola. Cereals advertised as high fiber. Potato skins. Whole grain pasta, wild or brown rice. Popcorn. Coconut flour. Bran, buckwheat, corn bread, multigrains, rye, wheat germ.  Vegetables Fresh, cooked or canned vegetables, such as artichokes, asparagus, beet greens, broccoli, Brussels sprouts, cabbage, celery, cauliflower, corn, eggplant, kale, legumes or beans, okra, peas, and tomatoes. Avoid large servings of any vegetables, especially raw vegetables.  Fruits Fresh fruits, such as apples with or without skin, berries, cherries, figs, grapes, grapefruit, guavas, kiwis, mangoes, oranges, papayas, pears, persimmons, pineapple, and pomegranate. Prune juice and juices with pulp, stewed or dried prunes. Dried fruits, dates, raisins. Fruit seeds or skins. Avoid large servings of all fresh fruits. Meats and Other Protein Sources Tough, fibrous meats with gristle. Chunky nut butter. Cheese made with seeds, nuts, or other foods not recommended. Nuts, seeds, legumes (beans, including baked beans), dried peas, beans, lentils.  Dairy Yogurt or cheese that contains nuts, seeds, or added fruit.  Beverages Fruit juices with high pulp, prune juice. Caffeinated coffee and teas.  Condiments Coconut, maple syrup, pickles, olives. Sweets and Desserts Desserts, cookies, or candies that contain nuts or coconut, chunky peanut butter, dried fruits. Jams, preserves with seeds, marmalade.  Large amounts of sugar and sweets. Any other dessert made with fruits from the not recommended list.  Other Soups made from vegetables that are not recommended or that contain other foods not recommended.  The items listed above may not be a complete list of foods and beverages to avoid. Contact your dietitian for more information.   This information is not intended to replace advice given to you by your health care provider. Make sure you discuss any questions you have with your health care provider.   Document Released: 05/16/2002 Document Revised: 11/29/2013 Document Reviewed: 10/17/2013 Elsevier Interactive Patient Education [employer].

## 2016-01-08 NOTE — Progress Notes (Signed)
Subjective:    Patient ID: Heather [name], female    DOB: 04/21/1935, 81 y.o.   MRN: [id number]  HPI  Wt Readings from Last 3 Encounters:  12/11/15 116 lb (52.617 kg)  10/12/15 113 lb (51.256 kg)  09/04/15 112 lb (50.17 kg)   81 year old patient who is seen today in follow-up.  She presents with a one-week history of increasing abdominal discomfort, distention, nausea, anorexia.  She is seen here  about one month ago with intermittent bright red rectal bleeding.  This has not reoccurred. She was seen in the ED last month with CT abdominal scan revealed no acute findings.  She also describes some mild left flank discomfort.  She has been using tramadol without benefit. She continues to have intermittent constipation treated with Dulcolax with bouts of some loose watery stool.  She has been on a high fiber diet and also insure supplements.  She has seen GI in the past.  Past Medical History  Diagnosis Date  . ANXIETY 07/23/2009  . DEPRESSION 07/23/2009  . DIVERTICULOSIS, COLON 07/23/2009  . GERD 07/23/2009  . HIP PAIN, LEFT 05/16/2010  . HYPERLIPIDEMIA 07/23/2009  . HYPERTENSION 07/23/2009  . HYPOTHYROIDISM 07/23/2009  . LOW BACK PAIN, CHRONIC 10/01/2009  . PARESTHESIA 10/01/2009  . IBS (irritable bowel syndrome)   . History of Crohn's disease   . Chronic pain   . Hypothyroidism   . Insomnia   . Headache(784.0) 07/23/2009    occasional  . Shingles 2006    back  . Hemorrhoids   . Complication of anesthesia 7 or 8 yrs ago    woke up during colonscopy  . Atherosclerosis of aorta ([place]) 07/24/2015  . Coronary atherosclerosis 07/24/2015    Social History   Social History  . Marital Status: Widowed    Spouse Name: N/A  . Number of Children: 1  . Years of Education: N/A   Occupational History  . Not on file.   Social History Main Topics  . Smoking status: Never Smoker   . Smokeless tobacco: Never Used  . Alcohol Use: No  . Drug Use: No  . Sexual Activity: Not on file    Other Topics Concern  . Not on file   Social History Narrative   Widowed.  Lives alone in her own home.  Ambulates with a cane/walker when needed.    Past Surgical History  Procedure Laterality Date  . Tonsillectomy  yrs ago  . Hemorrhoid surgery  yrs ago  . Appendectomy  yrs ago  . Bilateral salpingoophorectomy  age 81 or 73  . Abdominal hysterectomy  age 81 or 57  . Colonscopy  7 or 8 yrs ago  . Esophagogastroduodenoscopy N/A 11/17/2013    Procedure: ESOPHAGOGASTRODUODENOSCOPY (EGD);  Surgeon: Cleotis Nipper, MD;  Location: [facility];  Service: Endoscopy;  Laterality: N/A;  . Colonoscopy N/A 11/17/2013    Procedure: COLONOSCOPY;  Surgeon: Cleotis Nipper, MD;  Location: [facility];  Service: Endoscopy;  Laterality: N/A;    Family History  Problem Relation Age of Onset  . Clotting disorder Neg Hx   . COPD Sister   . Cancer Sister     Breast  . Cancer Mother     Unknown type    Allergies  Allergen Reactions  . Tylenol [Acetaminophen] Other (See Comments)    Nightmares  . Symbicort [Budesonide-Formoterol Fumarate] Other (See Comments)    Pt felt like her tongue was swollen, could not swallow  . Duloxetine Other (See Comments)  Patient doesn't recall  . Metronidazole Other (See Comments)    Patient doesn't recall   . Rofecoxib Other (See Comments)    Patient doesn't recall     Current Outpatient Prescriptions on File Prior to Visit  Medication Sig Dispense Refill  . Albuterol Sulfate (PROAIR RESPICLICK) 161 (90 BASE) MCG/ACT AEPB Inhale 2 puffs into the lungs every 6 (six) hours as needed. 1 each 1  . benzonatate (TESSALON) 100 MG capsule Take 1 capsule (100 mg total) by mouth 2 (two) times daily as needed for cough. 20 capsule 0  . Cholecalciferol 2000 UNITS TABS Take 2,000 Units by mouth daily.    . clorazepate (TRANXENE) 7.5 MG tablet Take 1 tablet (7.5 mg total) by mouth 2 (two) times daily as needed for anxiety. 180 tablet 2  . diltiazem (CARDIZEM  CD) 240 MG 24 hr capsule TAKE (1) CAPSULE DAILY. 90 capsule 1  . feeding supplement, ENSURE ENLIVE, (ENSURE ENLIVE) LIQD Take 237 mLs by mouth 2 (two) times daily between meals. 237 mL 12  . levothyroxine (SYNTHROID, LEVOTHROID) 25 MCG tablet TAKE 1 TABLET DAILY BEFORE BREAKFAST. 90 tablet 1  . NEXIUM 40 MG capsule TAKE 1 CAPSULE DAILY BEFORE BREAKFAST. 90 capsule 1  . Polyethyl Glycol-Propyl Glycol (SYSTANE OP) Place 1 drop into both eyes 2 (two) times daily as needed (for dry eyes).     . sertraline (ZOLOFT) 25 MG tablet TAKE 1 TABLET EACH DAY. 90 tablet 1  . tiotropium (SPIRIVA HANDIHALER) 18 MCG inhalation capsule Place 1 capsule (18 mcg total) into inhaler and inhale daily. 30 capsule 12  . traMADol (ULTRAM) 50 MG tablet TAKE 1 TABLET EVERY SIX HOURS AS NEEDED FOR PAIN. 60 tablet 2   No current facility-administered medications on file prior to visit.    There were no vitals taken for this visit.      Review of Systems  Constitutional: Positive for appetite change.  HENT: Negative for congestion, dental problem, hearing loss, rhinorrhea, sinus pressure, sore throat and tinnitus.   Eyes: Negative for pain, discharge and visual disturbance.  Respiratory: Negative for cough and shortness of breath.   Cardiovascular: Negative for chest pain, palpitations and leg swelling.  Gastrointestinal: Positive for nausea, abdominal pain, diarrhea, constipation and abdominal distention. Negative for vomiting and blood in stool.  Genitourinary: Negative for dysuria, urgency, frequency, hematuria, flank pain, vaginal bleeding, vaginal discharge, difficulty urinating, vaginal pain and pelvic pain.  Musculoskeletal: Negative for joint swelling, arthralgias and gait problem.  Skin: Negative for rash.  Neurological: Negative for dizziness, syncope, speech difficulty, weakness, numbness and headaches.  Hematological: Negative for adenopathy.  Psychiatric/Behavioral: Negative for behavioral problems,  dysphoric mood and agitation. The patient is not nervous/anxious.        Objective:   Physical Exam  Constitutional: She is oriented to person, place, and time. She appears well-developed and well-nourished. No distress.  Afebrile Anxious Stable vital signs  HENT:  Head: Normocephalic.  Right Ear: External ear normal.  Left Ear: External ear normal.  Mouth/Throat: Oropharynx is clear and moist.  Eyes: Conjunctivae and EOM are normal. Pupils are equal, round, and reactive to light.  Neck: Normal range of motion. Neck supple. No thyromegaly present.  Cardiovascular: Normal rate, regular rhythm, normal heart sounds and intact distal pulses.   Pulmonary/Chest: Effort normal and breath sounds normal.  Abdominal: Soft. Bowel sounds are normal. She exhibits distension. She exhibits no mass. There is no tenderness. There is no rebound and no guarding.  Appears to be mildly  distended and tympanitic Bowel sounds active No guarding or rebound  Musculoskeletal: Normal range of motion.  Lymphadenopathy:    She has no cervical adenopathy.  Neurological: She is alert and oriented to person, place, and time.  Skin: Skin is warm and dry. No rash noted.  Psychiatric: She has a normal mood and affect. Her behavior is normal.          Assessment & Plan:   Abdominal pain and distention History of diverticulosis History of recent radiographs and CT abdominal scan which have been nonrevealing History of fairly recent colonoscopy 2014 Essential hypertension, stable Anxiety disorder  Due to the chronicity of her complaints, will set up for GI evaluation

## 2016-01-08 NOTE — Progress Notes (Signed)
Pre visit review using our clinic review tool, if applicable. No additional management support is needed unless otherwise documented below in the visit note. 

## 2016-01-18 DIAGNOSIS — R198 Other specified symptoms and signs involving the digestive system and abdomen: Secondary | ICD-10-CM | POA: Diagnosis not present

## 2016-01-18 DIAGNOSIS — R1084 Generalized abdominal pain: Secondary | ICD-10-CM | POA: Diagnosis not present

## 2016-01-18 DIAGNOSIS — K589 Irritable bowel syndrome without diarrhea: Secondary | ICD-10-CM | POA: Diagnosis not present

## 2016-02-03 ENCOUNTER — Other Ambulatory Visit: Payer: Self-pay | Admitting: Internal Medicine

## 2016-02-17 ENCOUNTER — Other Ambulatory Visit: Payer: Self-pay | Admitting: Internal Medicine

## 2016-02-21 DIAGNOSIS — K5901 Slow transit constipation: Secondary | ICD-10-CM | POA: Diagnosis not present

## 2016-02-21 DIAGNOSIS — R1030 Lower abdominal pain, unspecified: Secondary | ICD-10-CM | POA: Diagnosis not present

## 2016-02-21 DIAGNOSIS — K581 Irritable bowel syndrome with constipation: Secondary | ICD-10-CM | POA: Diagnosis not present

## 2016-02-26 ENCOUNTER — Ambulatory Visit: Payer: Medicare Other | Admitting: Internal Medicine

## 2016-03-10 ENCOUNTER — Other Ambulatory Visit: Payer: Self-pay | Admitting: Internal Medicine

## 2016-03-19 ENCOUNTER — Other Ambulatory Visit: Payer: Self-pay | Admitting: Internal Medicine

## 2016-04-01 ENCOUNTER — Encounter: Payer: Self-pay | Admitting: Internal Medicine

## 2016-04-01 ENCOUNTER — Ambulatory Visit (INDEPENDENT_AMBULATORY_CARE_PROVIDER_SITE_OTHER): Payer: Medicare Other | Admitting: Internal Medicine

## 2016-04-01 VITALS — BP 120/66 | HR 80 | Temp 98.1°F | Resp 20 | Ht 59.0 in | Wt 115.0 lb

## 2016-04-01 DIAGNOSIS — E039 Hypothyroidism, unspecified: Secondary | ICD-10-CM | POA: Diagnosis not present

## 2016-04-01 DIAGNOSIS — G8929 Other chronic pain: Secondary | ICD-10-CM

## 2016-04-01 DIAGNOSIS — I10 Essential (primary) hypertension: Secondary | ICD-10-CM | POA: Diagnosis not present

## 2016-04-01 DIAGNOSIS — M545 Low back pain: Secondary | ICD-10-CM

## 2016-04-01 MED ORDER — CLORAZEPATE DIPOTASSIUM 7.5 MG PO TABS: 7.5000 mg | ORAL_TABLET | Freq: Two times a day (BID) | ORAL | Status: DC | PRN

## 2016-04-01 MED ORDER — TRAMADOL HCL 50 MG PO TABS: ORAL_TABLET | ORAL | Status: DC

## 2016-04-01 MED ORDER — ALBUTEROL SULFATE 108 (90 BASE) MCG/ACT IN AEPB: 2.0000 | INHALATION_SPRAY | Freq: Four times a day (QID) | RESPIRATORY_TRACT | Status: DC | PRN

## 2016-04-01 MED ORDER — ZOLPIDEM TARTRATE 5 MG PO TABS: 5.0000 mg | ORAL_TABLET | Freq: Every evening | ORAL | Status: DC | PRN

## 2016-04-01 MED ORDER — FUROSEMIDE 20 MG PO TABS: ORAL_TABLET | ORAL | Status: DC

## 2016-04-01 NOTE — Progress Notes (Signed)
Subjective:    Patient ID: Heather [name], female    DOB: 10/31/1935, 81 y.o.   MRN: [id number]  HPI  81 year old patient who is seen today in follow-up.  She has done fairly well over the past few months.  She's had no further recurrent GI complaints.  She has a history of essential hypertension and treated hypothyroidism.  She has a history of anxiety or depression. She is requesting a handicap placard.  This was completed. Requires multiple medication refills  Past Medical History  Diagnosis Date  . ANXIETY 07/23/2009  . DEPRESSION 07/23/2009  . DIVERTICULOSIS, COLON 07/23/2009  . GERD 07/23/2009  . HIP PAIN, LEFT 05/16/2010  . HYPERLIPIDEMIA 07/23/2009  . HYPERTENSION 07/23/2009  . HYPOTHYROIDISM 07/23/2009  . LOW BACK PAIN, CHRONIC 10/01/2009  . PARESTHESIA 10/01/2009  . IBS (irritable bowel syndrome)   . History of Crohn's disease   . Chronic pain   . Hypothyroidism   . Insomnia   . Headache(784.0) 07/23/2009    occasional  . Shingles 2006    back  . Hemorrhoids   . Complication of anesthesia 7 or 8 yrs ago    woke up during colonscopy  . Atherosclerosis of aorta ([place]) 07/24/2015  . Coronary atherosclerosis 07/24/2015     Social History   Social History  . Marital Status: Widowed    Spouse Name: N/A  . Number of Children: 1  . Years of Education: N/A   Occupational History  . Not on file.   Social History Main Topics  . Smoking status: Never Smoker   . Smokeless tobacco: Never Used  . Alcohol Use: No  . Drug Use: No  . Sexual Activity: Not on file   Other Topics Concern  . Not on file   Social History Narrative   Widowed.  Lives alone in her own home.  Ambulates with a cane/walker when needed.    Past Surgical History  Procedure Laterality Date  . Tonsillectomy  yrs ago  . Hemorrhoid surgery  yrs ago  . Appendectomy  yrs ago  . Bilateral salpingoophorectomy  age 20 or 15  . Abdominal hysterectomy  age 11 or 59  . Colonscopy  7 or 8 yrs ago  .  Esophagogastroduodenoscopy N/A 11/17/2013    Procedure: ESOPHAGOGASTRODUODENOSCOPY (EGD);  Surgeon: Cleotis Nipper, MD;  Location: [facility];  Service: Endoscopy;  Laterality: N/A;  . Colonoscopy N/A 11/17/2013    Procedure: COLONOSCOPY;  Surgeon: Cleotis Nipper, MD;  Location: [facility];  Service: Endoscopy;  Laterality: N/A;    Family History  Problem Relation Age of Onset  . Clotting disorder Neg Hx   . COPD Sister   . Cancer Sister     Breast  . Cancer Mother     Unknown type    Allergies  Allergen Reactions  . Tylenol [Acetaminophen] Other (See Comments)    Nightmares  . Symbicort [Budesonide-Formoterol Fumarate] Other (See Comments)    Pt felt like her tongue was swollen, could not swallow  . Duloxetine Other (See Comments)    Patient doesn't recall  . Metronidazole Other (See Comments)    Patient doesn't recall   . Rofecoxib Other (See Comments)    Patient doesn't recall     Current Outpatient Prescriptions on File Prior to Visit  Medication Sig Dispense Refill  . benzonatate (TESSALON) 100 MG capsule TAKE 1 CAPSULE TWICE DAILY AS NEEDED FOR COUGH. 20 capsule 0  . Cholecalciferol 2000 UNITS TABS Take 2,000 Units by mouth  daily.    . diltiazem (CARDIZEM CD) 240 MG 24 hr capsule TAKE (1) CAPSULE DAILY. 90 capsule 1  . feeding supplement, ENSURE ENLIVE, (ENSURE ENLIVE) LIQD Take 237 mLs by mouth 2 (two) times daily between meals. 237 mL 12  . levothyroxine (SYNTHROID, LEVOTHROID) 25 MCG tablet TAKE 1 TABLET DAILY BEFORE BREAKFAST. 90 tablet 1  . NEXIUM 40 MG capsule TAKE 1 CAPSULE DAILY BEFORE BREAKFAST. 90 capsule 0  . Polyethyl Glycol-Propyl Glycol (SYSTANE OP) Place 1 drop into both eyes 2 (two) times daily as needed (for dry eyes).     . promethazine (PHENERGAN) 25 MG tablet TAKE 1 TABLET EVERY 8 HOURS AS NEEDED FOR NAUSEA. 20 tablet 5  . sertraline (ZOLOFT) 25 MG tablet TAKE 1 TABLET EACH DAY. 90 tablet 0  . tiotropium (SPIRIVA HANDIHALER) 18 MCG  inhalation capsule Place 1 capsule (18 mcg total) into inhaler and inhale daily. 30 capsule 12  . traZODone (DESYREL) 50 MG tablet TAKE 1/2 TO 1 TABLET AT BEDTIME AS NEEDED FOR REST. 60 tablet 2   No current facility-administered medications on file prior to visit.    BP 120/66 mmHg  Pulse 80  Temp(Src) 98.1 F (36.7 C) (Oral)  Resp 20  Ht '4\' 11"'$  (1.499 m)  Wt 115 lb (52.164 kg)  BMI 23.21 kg/m2  SpO2 94%     Review of Systems  Constitutional: Negative.   HENT: Negative for congestion, dental problem, hearing loss, rhinorrhea, sinus pressure, sore throat and tinnitus.   Eyes: Negative for pain, discharge and visual disturbance.  Respiratory: Negative for cough and shortness of breath.   Cardiovascular: Negative for chest pain, palpitations and leg swelling.  Gastrointestinal: Negative for nausea, vomiting, abdominal pain, diarrhea, constipation, blood in stool and abdominal distention.  Genitourinary: Negative for dysuria, urgency, frequency, hematuria, flank pain, vaginal bleeding, vaginal discharge, difficulty urinating, vaginal pain and pelvic pain.  Musculoskeletal: Positive for back pain and arthralgias. Negative for joint swelling and gait problem.  Skin: Negative for rash.  Neurological: Negative for dizziness, syncope, speech difficulty, weakness, numbness and headaches.  Hematological: Negative for adenopathy.  Psychiatric/Behavioral: Positive for sleep disturbance. Negative for behavioral problems, dysphoric mood and agitation. The patient is nervous/anxious.        Objective:   Physical Exam  Constitutional: She is oriented to person, place, and time. She appears well-developed and well-nourished.  HENT:  Head: Normocephalic.  Right Ear: External ear normal.  Left Ear: External ear normal.  Mouth/Throat: Oropharynx is clear and moist.  Eyes: Conjunctivae and EOM are normal. Pupils are equal, round, and reactive to light.  Neck: Normal range of motion. Neck  supple. No thyromegaly present.  Cardiovascular: Normal rate, regular rhythm, normal heart sounds and intact distal pulses.   Pulmonary/Chest: Effort normal and breath sounds normal.  Abdominal: Soft. Bowel sounds are normal. She exhibits no mass. There is no tenderness.  Musculoskeletal: Normal range of motion.  Lymphadenopathy:    She has no cervical adenopathy.  Neurological: She is alert and oriented to person, place, and time.  Skin: Skin is warm and dry. No rash noted.  Psychiatric: She has a normal mood and affect. Her behavior is normal.          Assessment & Plan:   Essential hypertension, stable.  We'll continue present regimen Anxiety disorder, stable Chronic low back pain.  Medications refilled Coronary artery disease, stable  No change in medical regimen Handicap placard completed  Return in 4-6 weeks for follow-up Medications updated

## 2016-04-01 NOTE — Progress Notes (Signed)
Pre visit review using our clinic review tool, if applicable. No additional management support is needed unless otherwise documented below in the visit note. 

## 2016-04-01 NOTE — Patient Instructions (Signed)
Limit your sodium (Salt) intake  Please check your blood pressure on a regular basis.  If it is consistently greater than 150/90, please make an office appointment.  Take a calcium supplement, plus [phone] units of vitamin D

## 2016-04-07 ENCOUNTER — Other Ambulatory Visit: Payer: Self-pay | Admitting: Internal Medicine

## 2016-04-16 ENCOUNTER — Other Ambulatory Visit: Payer: Self-pay | Admitting: Internal Medicine

## 2016-05-06 ENCOUNTER — Other Ambulatory Visit: Payer: Self-pay | Admitting: Internal Medicine

## 2016-05-20 ENCOUNTER — Other Ambulatory Visit: Payer: Self-pay | Admitting: Internal Medicine

## 2016-05-20 NOTE — Telephone Encounter (Signed)
Refill sent to pharmacy.   

## 2016-05-27 ENCOUNTER — Other Ambulatory Visit: Payer: Self-pay | Admitting: Internal Medicine

## 2016-05-28 ENCOUNTER — Encounter (HOSPITAL_COMMUNITY): Payer: Self-pay | Admitting: *Deleted

## 2016-05-28 ENCOUNTER — Emergency Department (HOSPITAL_COMMUNITY): Payer: Medicare Other

## 2016-05-28 ENCOUNTER — Inpatient Hospital Stay (HOSPITAL_COMMUNITY)
Admission: EM | Admit: 2016-05-28 | Discharge: 2016-06-01 | DRG: 192 | Disposition: A | Discharge status: Home / Self Care | Payer: Medicare Other | Attending: Internal Medicine | Admitting: Internal Medicine

## 2016-05-28 DIAGNOSIS — R05 Cough: Secondary | ICD-10-CM | POA: Diagnosis not present

## 2016-05-28 DIAGNOSIS — T148 Other injury of unspecified body region: Secondary | ICD-10-CM | POA: Diagnosis not present

## 2016-05-28 DIAGNOSIS — Z825 Family history of asthma and other chronic lower respiratory diseases: Secondary | ICD-10-CM

## 2016-05-28 DIAGNOSIS — G8929 Other chronic pain: Secondary | ICD-10-CM | POA: Diagnosis present

## 2016-05-28 DIAGNOSIS — M25559 Pain in unspecified hip: Secondary | ICD-10-CM | POA: Diagnosis present

## 2016-05-28 DIAGNOSIS — E785 Hyperlipidemia, unspecified: Secondary | ICD-10-CM | POA: Diagnosis not present

## 2016-05-28 DIAGNOSIS — R1310 Dysphagia, unspecified: Secondary | ICD-10-CM

## 2016-05-28 DIAGNOSIS — E039 Hypothyroidism, unspecified: Secondary | ICD-10-CM | POA: Diagnosis not present

## 2016-05-28 DIAGNOSIS — W19XXXA Unspecified fall, initial encounter: Secondary | ICD-10-CM

## 2016-05-28 DIAGNOSIS — K219 Gastro-esophageal reflux disease without esophagitis: Secondary | ICD-10-CM | POA: Diagnosis present

## 2016-05-28 DIAGNOSIS — I10 Essential (primary) hypertension: Secondary | ICD-10-CM | POA: Diagnosis present

## 2016-05-28 DIAGNOSIS — W07XXXA Fall from chair, initial encounter: Secondary | ICD-10-CM | POA: Diagnosis present

## 2016-05-28 DIAGNOSIS — M25552 Pain in left hip: Secondary | ICD-10-CM | POA: Diagnosis present

## 2016-05-28 DIAGNOSIS — J441 Chronic obstructive pulmonary disease with (acute) exacerbation: Secondary | ICD-10-CM | POA: Diagnosis not present

## 2016-05-28 DIAGNOSIS — R0602 Shortness of breath: Secondary | ICD-10-CM | POA: Diagnosis not present

## 2016-05-28 DIAGNOSIS — F411 Generalized anxiety disorder: Secondary | ICD-10-CM | POA: Diagnosis present

## 2016-05-28 DIAGNOSIS — K589 Irritable bowel syndrome without diarrhea: Secondary | ICD-10-CM | POA: Diagnosis present

## 2016-05-28 DIAGNOSIS — R0902 Hypoxemia: Secondary | ICD-10-CM | POA: Diagnosis present

## 2016-05-28 DIAGNOSIS — S3991XA Unspecified injury of abdomen, initial encounter: Secondary | ICD-10-CM | POA: Diagnosis not present

## 2016-05-28 DIAGNOSIS — I2510 Atherosclerotic heart disease of native coronary artery without angina pectoris: Secondary | ICD-10-CM | POA: Diagnosis not present

## 2016-05-28 DIAGNOSIS — F419 Anxiety disorder, unspecified: Secondary | ICD-10-CM | POA: Diagnosis present

## 2016-05-28 DIAGNOSIS — M533 Sacrococcygeal disorders, not elsewhere classified: Secondary | ICD-10-CM | POA: Diagnosis not present

## 2016-05-28 LAB — I-STAT CHEM 8, ED
BUN: 7 mg/dL (ref 6–20)
Calcium, Ion: 1.15 mmol/L (ref 1.13–1.30)
Chloride: 101 mmol/L (ref 101–111)
Creatinine, Ser: 0.8 mg/dL (ref 0.44–1.00)
Glucose, Bld: 192 mg/dL — ABNORMAL HIGH (ref 65–99)
HCT: 39 % (ref 36.0–46.0)
Hemoglobin: 13.3 g/dL (ref 12.0–15.0)
Potassium: 3.2 mmol/L — ABNORMAL LOW (ref 3.5–5.1)
Sodium: 140 mmol/L (ref 135–145)
TCO2: 25 mmol/L (ref 0–100)

## 2016-05-28 LAB — COMPREHENSIVE METABOLIC PANEL
ALT: 18 U/L (ref 14–54)
AST: 31 U/L (ref 15–41)
Albumin: 4 g/dL (ref 3.5–5.0)
Alkaline Phosphatase: 93 U/L (ref 38–126)
Anion gap: 14 (ref 5–15)
BUN: 9 mg/dL (ref 6–20)
CO2: 23 mmol/L (ref 22–32)
Calcium: 9.3 mg/dL (ref 8.9–10.3)
Chloride: 102 mmol/L (ref 101–111)
Creatinine, Ser: 0.92 mg/dL (ref 0.44–1.00)
GFR calc Af Amer: >60 mL/min (ref >60)
GFR calc non Af Amer: 58 mL/min — ABNORMAL LOW (ref >60)
Glucose, Bld: 192 mg/dL — ABNORMAL HIGH (ref 65–99)
Potassium: 3.1 mmol/L — ABNORMAL LOW (ref 3.5–5.1)
Sodium: 139 mmol/L (ref 135–145)
Total Bilirubin: 0.4 mg/dL (ref 0.3–1.2)
Total Protein: 7.4 g/dL (ref 6.5–8.1)

## 2016-05-28 LAB — TROPONIN I: Troponin I: <0.03 ng/mL (ref <0.031)

## 2016-05-28 LAB — CBC WITH DIFFERENTIAL/PLATELET
Basophils Absolute: 0 10*3/uL (ref 0.0–0.1)
Basophils Relative: 0 %
Eosinophils Absolute: 0 10*3/uL (ref 0.0–0.7)
Eosinophils Relative: 0 %
HCT: 36.8 % (ref 36.0–46.0)
Hemoglobin: 12.4 g/dL (ref 12.0–15.0)
Lymphocytes Relative: 5 %
Lymphs Abs: 0.6 10*3/uL — ABNORMAL LOW (ref 0.7–4.0)
MCH: 29 pg (ref 26.0–34.0)
MCHC: 33.7 g/dL (ref 30.0–36.0)
MCV: 86.2 fL (ref 78.0–100.0)
Monocytes Absolute: 0.1 10*3/uL (ref 0.1–1.0)
Monocytes Relative: 1 %
Neutro Abs: 12.5 10*3/uL — ABNORMAL HIGH (ref 1.7–7.7)
Neutrophils Relative %: 94 %
Platelets: 378 10*3/uL (ref 150–400)
RBC: 4.27 MIL/uL (ref 3.87–5.11)
RDW: 13.1 % (ref 11.5–15.5)
WBC: 13.2 10*3/uL — ABNORMAL HIGH (ref 4.0–10.5)

## 2016-05-28 MED ORDER — PROMETHAZINE HCL 25 MG PO TABS
25.0000 mg | ORAL_TABLET | Freq: Once | ORAL | Status: AC
  Administered 2016-05-28: 25 mg via ORAL
  Filled 2016-05-28: qty 1

## 2016-05-28 MED ORDER — IPRATROPIUM-ALBUTEROL 0.5-2.5 (3) MG/3ML IN SOLN
3.0000 mL | Freq: Once | RESPIRATORY_TRACT | Status: AC
  Administered 2016-05-28: 3 mL via RESPIRATORY_TRACT
  Filled 2016-05-28: qty 3

## 2016-05-28 MED ORDER — OXYCODONE HCL 5 MG PO TABS
5.0000 mg | ORAL_TABLET | Freq: Once | ORAL | Status: AC
  Administered 2016-05-28: 5 mg via ORAL
  Filled 2016-05-28: qty 1

## 2016-05-28 MED ORDER — PREDNISONE 20 MG PO TABS
40.0000 mg | ORAL_TABLET | Freq: Once | ORAL | Status: AC
  Administered 2016-05-28: 40 mg via ORAL
  Filled 2016-05-28: qty 2

## 2016-05-28 MED ORDER — OXYCODONE-ACETAMINOPHEN 5-325 MG PO TABS
2.0000 | ORAL_TABLET | Freq: Once | ORAL | Status: AC
  Administered 2016-05-28: 2 via ORAL
  Filled 2016-05-28: qty 2

## 2016-05-28 MED ORDER — AZITHROMYCIN 250 MG PO TABS
500.0000 mg | ORAL_TABLET | Freq: Once | ORAL | Status: AC
  Administered 2016-05-28: 500 mg via ORAL
  Filled 2016-05-28: qty 2

## 2016-05-28 NOTE — H&P (Signed)
History and Physical    Heather [name] WCB:[id number] DOB: 05/06/1935 DOA: 05/28/2016  PCP: [name], MD Consultants: GI - Buccini Patient coming from: home  Chief Complaint: SOB/wheezing/fall  HPI: Heather [name] is a 81 y.o. female with medical history significant of  COPD (reported by patient but not documented); IBS; chronic pain; and multiple other medical problems .  She reports that she has been in bed since Sunday with cough, using inhalers as instructed but symptoms kept getting worse.  She was considering an ER evaluation anyway when her telephone rang and she got up to answer it and her chair slipped out from underneath her.  Her feet went straight up and it hurt her back/tailbone badly.  Due to ongoing pain she called 911. She was very SOB while on the floor waiting for EMS.  They provided a breathing treatment and then got her out of the floor.  She does check her pulse ox daily and the last few days it has been down to 85%.  Has chronic problems with L hip and the pain is worse now after the fall.  Monday, temp was 100.4; otherwise no fevers.  Has been feeling diaphoretic and clammy.  +cough - productive of yellow sputum.  +wheezing - ambulance driver told her they could hear her wheezing from down the hall.  Has a chronic swallowing problem - able to eat small bites but has solid and pill dysphagia (takes pills with pudding) but no liquid dysphagia.  Also has IBS and she had laxative changed within the last few months with no improvement in symptoms.      ED Course: pelvic CT to evaluate for fracture; duonebs/steroids/Azithromycin for wheezing  Review of Systems: As per HPI otherwise 10 point review of systems reviewed and negative.   Ambulatory Status: ambulatory  Past Medical History  Diagnosis Date  . ANXIETY 07/23/2009  . DEPRESSION 07/23/2009  . DIVERTICULOSIS, COLON 07/23/2009  . GERD 07/23/2009  . HIP PAIN, LEFT 05/16/2010  . HYPERLIPIDEMIA  07/23/2009  . HYPERTENSION 07/23/2009  . HYPOTHYROIDISM 07/23/2009  . LOW BACK PAIN, CHRONIC 10/01/2009  . PARESTHESIA 10/01/2009  . IBS (irritable bowel syndrome)   . History of Crohn's disease   . Chronic pain   . Insomnia   . Headache(784.0) 07/23/2009    occasional  . Shingles 2006    back  . Hemorrhoids   . Complication of anesthesia 7 or 8 yrs ago    woke up during colonscopy  . Atherosclerosis of aorta ([place]) 07/24/2015  . Coronary atherosclerosis 07/24/2015    Past Surgical History  Procedure Laterality Date  . Tonsillectomy  yrs ago  . Hemorrhoid surgery  yrs ago  . Appendectomy  yrs ago  . Bilateral salpingoophorectomy  age 53 or 39  . Abdominal hysterectomy  age 48 or 21  . Esophagogastroduodenoscopy N/A 11/17/2013    Procedure: ESOPHAGOGASTRODUODENOSCOPY (EGD);  Surgeon: Cleotis Nipper, MD;  Location: [facility];  Service: Endoscopy;  Laterality: N/A;  . Colonoscopy N/A 11/17/2013    Procedure: COLONOSCOPY;  Surgeon: Cleotis Nipper, MD;  Location: [facility];  Service: Endoscopy;  Laterality: N/A;    Social History   Social History  . Marital Status: Widowed    Spouse Name: N/A  . Number of Children: 1  . Years of Education: N/A   Occupational History  . Not on file.   Social History Main Topics  . Smoking status: Never Smoker   . Smokeless tobacco: Never Used  Comment: smoked rarely in youth  . Alcohol Use: No  . Drug Use: No  . Sexual Activity: Not on file   Other Topics Concern  . Not on file   Social History Narrative   Widowed.  Lives alone in her own home.  Ambulates with a cane/walker when needed.    Allergies  Allergen Reactions  . Tylenol [Acetaminophen] Other (See Comments)    Nightmares  . Symbicort [Budesonide-Formoterol Fumarate] Other (See Comments)    Pt felt like her tongue was swollen, could not swallow  . Duloxetine Other (See Comments)    Patient doesn't recall  . Metronidazole Other (See Comments)    Patient  doesn't recall   . Rofecoxib Other (See Comments)    Patient doesn't recall     Family History  Problem Relation Age of Onset  . Clotting disorder Neg Hx   . COPD Sister   . Cancer Sister     Breast  . Cancer Mother     Unknown type    Prior to Admission medications   Medication Sig Start Date End Date Taking? Authorizing Provider  Albuterol Sulfate (PROAIR RESPICLICK) 502 (90 Base) MCG/ACT AEPB Inhale 2 puffs into the lungs every 6 (six) hours as needed. Patient taking differently: Inhale 2 puffs into the lungs every 6 (six) hours as needed (shortness of breath/ wheezing).  04/01/16  Yes [name], MD  benzonatate (TESSALON) 100 MG capsule TAKE 1 CAPSULE TWICE DAILY AS NEEDED FOR COUGH. 05/06/16  Yes [name], MD  Cholecalciferol 2000 UNITS TABS Take 2,000 Units by mouth daily.   Yes Historical Provider, MD  clorazepate (TRANXENE) 7.5 MG tablet Take 1 tablet (7.5 mg total) by mouth 2 (two) times daily as needed for anxiety. 04/01/16  Yes [name], MD  diltiazem (CARDIZEM CD) 240 MG 24 hr capsule TAKE (1) CAPSULE DAILY. Patient taking differently: TAKE (1) CAPSULE by mouth once DAILY. 05/27/16  Yes [name], MD  feeding supplement, ENSURE ENLIVE, (ENSURE ENLIVE) LIQD Take 237 mLs by mouth 2 (two) times daily between meals. Patient taking differently: Take 237 mLs by mouth 2 (two) times daily as needed (poor appetite).  07/25/15  Yes Robbie Lis, MD  furosemide (LASIX) 20 MG tablet 1 daily if needed for significant lower extremity swelling Patient taking differently: Take 20 mg by mouth daily as needed for fluid. 1 daily if needed for significant lower extremity swelling 04/01/16  Yes [name], MD  levothyroxine (SYNTHROID, LEVOTHROID) 25 MCG tablet TAKE 1 TABLET DAILY BEFORE BREAKFAST. 04/16/16  Yes [name], MD  NEXIUM 40 MG capsule TAKE 1 CAPSULE DAILY BEFORE BREAKFAST. 05/20/16  Yes [name], MD  Polyethyl  Glycol-Propyl Glycol (SYSTANE OP) Place 1 drop into both eyes 2 (two) times daily as needed (for dry eyes).    Yes Historical Provider, MD  promethazine (PHENERGAN) 25 MG tablet TAKE 1 TABLET EVERY 8 HOURS AS NEEDED FOR NAUSEA. 01/08/16  Yes [name], MD  sertraline (ZOLOFT) 25 MG tablet TAKE 1 TABLET EACH DAY. Patient taking differently: TAKE 1 TABLET by mouth once daily. 03/10/16  Yes [name], MD  tiotropium (SPIRIVA HANDIHALER) 18 MCG inhalation capsule Place 1 capsule (18 mcg total) into inhaler and inhale daily. 09/04/15  Yes [name], MD  traMADol (ULTRAM) 50 MG tablet TAKE 1 TABLET EVERY SIX HOURS AS NEEDED FOR PAIN. 04/01/16  Yes [name], MD  traZODone (DESYREL) 50 MG tablet TAKE  1/2 TO 1 TABLET AT BEDTIME AS NEEDED FOR REST. 03/19/16  Yes [name], MD  zolpidem (AMBIEN) 5 MG tablet Take 1 tablet (5 mg total) by mouth at bedtime as needed. Patient taking differently: Take 5 mg by mouth at bedtime as needed for sleep.  04/01/16  Yes [name], MD    Physical Exam: Filed Vitals:   05/28/16 1908 05/28/16 2216 05/29/16 0044 05/29/16 0116  BP: 152/67 163/69 102/58 119/61  Pulse: 91 105 92 87  Temp:  97.5 F (36.4 C) 98.5 F (36.9 C) 98.3 F (36.8 C)  TempSrc:  Oral Oral Oral  Resp: '18 28 19 20  '$ Height:    '4\' 11"'$  (1.499 m)  Weight:    53 kg (116 lb 13.5 oz)  SpO2: 93% 90% 97% 97%     General: Appears calm and comfortable.  Lying on L side due to hip/sacral pain Eyes: PERRL, EOMI, normal lids, iris ENT:  grossly normal hearing, lips & tongue, mmm Neck:  no LAD, masses or thyromegaly Cardiovascular:  RRR, no m/r/g. No LE edema.  Respiratory: Minimally increased respiratory effort while at rest or during exam.  Expiratory wheezes noted. Abdomen:  soft, ntnd, NABS Skin:  no rash or induration seen on limited exam.  No ecchymoses or obvious injury of back or buttocks. Musculoskeletal:  grossly normal tone BUE/BLE, good ROM,  no bony abnormality Psychiatric:  grossly normal mood and affect, speech fluent and appropriate, AOx3 Neurologic:  CN 2-12 grossly intact, moves all extremities in coordinated fashion, sensation intact  Labs on Admission: I have personally reviewed following labs and imaging studies  CBC:  Recent Labs Lab 05/28/16 2302 05/28/16 2312  WBC 13.2*  --   NEUTROABS 12.5*  --   HGB 12.4 13.3  HCT 36.8 39.0  MCV 86.2  --   PLT 378  --    Basic Metabolic Panel:  Recent Labs Lab 05/28/16 2302 05/28/16 2312  NA 139 140  K 3.1* 3.2*  CL 102 101  CO2 23  --   GLUCOSE 192* 192*  BUN 9 7  CREATININE 0.92 0.80  CALCIUM 9.3  --    GFR: Estimated Creatinine Clearance: 43.1 mL/min (by C-G formula based on Cr of 0.8). Liver Function Tests:  Recent Labs Lab 05/28/16 2302  AST 31  ALT 18  ALKPHOS 93  BILITOT 0.4  PROT 7.4  ALBUMIN 4.0   No results for input(s): LIPASE, AMYLASE in the last 168 hours. No results for input(s): AMMONIA in the last 168 hours. Coagulation Profile: No results for input(s): INR, PROTIME in the last 168 hours. Cardiac Enzymes:  Recent Labs Lab 05/28/16 2302  TROPONINI <0.03   BNP (last 3 results) No results for input(s): PROBNP in the last 8760 hours. HbA1C: No results for input(s): HGBA1C in the last 72 hours. CBG: No results for input(s): GLUCAP in the last 168 hours. Lipid Profile: No results for input(s): CHOL, HDL, LDLCALC, TRIG, CHOLHDL, LDLDIRECT in the last 72 hours. Thyroid Function Tests: No results for input(s): TSH, T4TOTAL, FREET4, T3FREE, THYROIDAB in the last 72 hours. Anemia Panel: No results for input(s): VITAMINB12, FOLATE, FERRITIN, TIBC, IRON, RETICCTPCT in the last 72 hours. Urine analysis:    Component Value Date/Time   COLORURINE AMBER* 08/22/2014 1105   APPEARANCEUR CLEAR 08/22/2014 1105   LABSPEC 1.011 08/22/2014 1105   PHURINE 6.0 08/22/2014 1105   GLUCOSEU NEGATIVE 08/22/2014 1105   HGBUR NEGATIVE 08/22/2014  1105   HGBUR trace-lysed 11/15/2010 1046  BILIRUBINUR NEGATIVE 08/22/2014 1105   KETONESUR NEGATIVE 08/22/2014 1105   PROTEINUR NEGATIVE 08/22/2014 1105   UROBILINOGEN 0.2 08/22/2014 1105   NITRITE NEGATIVE 08/22/2014 1105   LEUKOCYTESUR NEGATIVE 08/22/2014 1105    Creatinine Clearance: Estimated Creatinine Clearance: 43.1 mL/min (by C-G formula based on Cr of 0.8).  Sepsis Labs: '@LABRCNTIP'$ (procalcitonin:4,lacticidven:4) )No results found for this or any previous visit (from the past 240 hour(s)).   Radiological Exams on Admission: Dg Chest 2 View  05/28/2016  CLINICAL DATA:  Short of breath with productive cough for 1 week. History of emphysema and hypertension. EXAM: CHEST  2 VIEW COMPARISON:  11/17/2015 FINDINGS: Cardiac silhouette is normal in size. No mediastinal or hilar masses or evidence of adenopathy. Lungs are hyperexpanded but clear. No pleural effusion or pneumothorax. Bony thorax is intact. IMPRESSION: No acute cardiopulmonary disease. Hyperexpanded lungs consistent with COPD. Electronically Signed   By: Lajean Manes M.D.   On: 05/28/2016 17:53   Ct Pelvis Wo Contrast  05/28/2016  CLINICAL DATA:  Patient's chair slipped out from under her. C/o tailbone pain. Eval for fx. EXAM: CT PELVIS WITHOUT CONTRAST TECHNIQUE: Multidetector CT imaging of the pelvis was performed following the standard protocol without intravenous contrast. COMPARISON:  CT abdomen pelvis, 11/17/2015 FINDINGS: No fracture.  No bone lesion. Hip joints are normally spaced and aligned. There is subchondral cystic change along the superior right acetabulum. No other arthropathic change. No hip joint effusions. SI joints and symphysis pubis are normally spaced and aligned. Uterus is surgically absent. No pelvic masses or abnormal fluid collections. There is diverticulosis along the visualize left colon. No diverticulitis. No masses or adenopathy. Atherosclerotic calcification noted along the lower aorta and the  iliac vessels. Surrounding soft tissues are unremarkable. IMPRESSION: 1. No fracture or acute bony abnormality.  No dislocation. 2. Left colon diverticulosis. 3. Aortic and iliac vessel atherosclerosis. Electronically Signed   By: Lajean Manes M.D.   On: 05/28/2016 17:57    EKG: None  Assessment/Plan Principal Problem:   COPD exacerbation (HCC) Active Problems:   Hypothyroidism   Anxiety state   Essential hypertension   GERD   HIP PAIN, LEFT   Dysphagia    1. COPD Exacerbation - Patient's shortness of breath and productive cough are most likely caused by acute COPD exacerbation. She reports a h/o COPD and CXR is consistent with this diagnosis.  There is no apparent infiltrate appreciated on CXR.  Patient received 1 dose of azithromycin, steroids, and duoneb in the ED. She is not apparently followed by pulmonology and it is not clear if she has previously had spirometry performed. Plan:  -will admit patient to telemetry bed, observation status -COPD GOLD protocols initiated. -Nebulizers: scheduled Duoneb and prn albuterol -Prednisone 40 mg PO daily  -Oral azithromycin for 5 days.   -O2 supplementation as needed for hypoxia/tachypnea/DOE  2. Hypothyroidism - Continue Synthroid.  No recent (since 2014) testing appreciated in EMR so will check TSH/free T4.  3. Anxiety - Continue Sertraline, Tranxene, Trazodone.  4. HTN - Continue Cardizem - although this is a somewhat unusual option for HTN monotherapy.  5. GERD, current dysphagia - Nexium not preferred so changed to Protonix (PO).  Patient with c/o persistent dysphagia that has not apparently been evaluated.  Will order ST consult for further evaluation.  6. Hip pain - CT negative for fracture following fall but now hip pain is worse and she also has sacral pain.  Will order pain medications and follow.  Ambulation with assistance for now  and PT consult requested.   DVT prophylaxis: Lovenox  Code Status: Full - verified with  patient  Family Communication: None - NOK is son, Anastasio Champion, [phone]  Disposition Plan: home when improved  Consults called: COPD Gold  Admission status: Observation, telemetry    Karmen Bongo MD [facility]  If 7PM-7AM, please contact night-coverage [email or web address] Password [email or web address]  05/29/2016, 1:31 AM

## 2016-05-28 NOTE — ED Notes (Signed)
Provider in room  

## 2016-05-28 NOTE — ED Notes (Signed)
Dr. Dayna Barker informed of pt's request for phenergan.

## 2016-05-28 NOTE — ED Notes (Signed)
Pt. Placed on 2 liters oxygen per EDP,Mesner,MD.

## 2016-05-28 NOTE — ED Notes (Signed)
Pt now c/o nausea.  Pt stated "I take phenergan every morning & I didn't take it this morning."

## 2016-05-28 NOTE — ED Notes (Signed)
Per [facility], pt's chair slipped out from under her.  C/o tailbone pain.  Upon arrival heard pt coming down the hall wheezing.

## 2016-05-28 NOTE — ED Provider Notes (Signed)
CSN: [id number]     Arrival date & time 05/28/16  1632 History   First MD Initiated Contact with Patient 05/28/16 1639     Chief Complaint  Patient presents with  . Fall    low lumbar vs sacral pain  . Wheezing     (Consider location/radiation/quality/duration/timing/severity/associated sxs/prior Treatment) Patient is a 81 y.o. female presenting with fall and wheezing.  Fall This is a new problem. The current episode started less than 1 hour ago. The problem occurs constantly. The problem has not changed since onset.Associated symptoms include chest pain (with coughing) and shortness of breath. Pertinent negatives include no headaches. Nothing aggravates the symptoms. She has tried nothing for the symptoms. The treatment provided no relief.  Wheezing Associated symptoms: chest pain (with coughing), cough and shortness of breath   Associated symptoms: no headaches     Past Medical History  Diagnosis Date  . ANXIETY 07/23/2009  . DEPRESSION 07/23/2009  . DIVERTICULOSIS, COLON 07/23/2009  . GERD 07/23/2009  . HIP PAIN, LEFT 05/16/2010  . HYPERLIPIDEMIA 07/23/2009  . HYPERTENSION 07/23/2009  . HYPOTHYROIDISM 07/23/2009  . LOW BACK PAIN, CHRONIC 10/01/2009  . PARESTHESIA 10/01/2009  . IBS (irritable bowel syndrome)   . History of Crohn's disease   . Chronic pain   . Insomnia   . Headache(784.0) 07/23/2009    occasional  . Shingles 2006    back  . Hemorrhoids   . Complication of anesthesia 7 or 8 yrs ago    woke up during colonscopy  . Atherosclerosis of aorta ([place]) 07/24/2015  . Coronary atherosclerosis 07/24/2015   Past Surgical History  Procedure Laterality Date  . Tonsillectomy  yrs ago  . Hemorrhoid surgery  yrs ago  . Appendectomy  yrs ago  . Bilateral salpingoophorectomy  age 43 or 27  . Abdominal hysterectomy  age 82 or 26  . Esophagogastroduodenoscopy N/A 11/17/2013    Procedure: ESOPHAGOGASTRODUODENOSCOPY (EGD);  Surgeon: Cleotis Nipper, MD;  Location: [facility];   Service: Endoscopy;  Laterality: N/A;  . Colonoscopy N/A 11/17/2013    Procedure: COLONOSCOPY;  Surgeon: Cleotis Nipper, MD;  Location: [facility];  Service: Endoscopy;  Laterality: N/A;   Family History  Problem Relation Age of Onset  . Clotting disorder Neg Hx   . COPD Sister   . Cancer Sister     Breast  . Cancer Mother     Unknown type   Social History  Substance Use Topics  . Smoking status: Never Smoker   . Smokeless tobacco: Never Used     Comment: smoked rarely in youth  . Alcohol Use: No   OB History    No data available     Review of Systems  Respiratory: Positive for cough, shortness of breath and wheezing.   Cardiovascular: Positive for chest pain (with coughing).  Neurological: Negative for headaches.  All other systems reviewed and are negative.     Allergies  Tylenol; Symbicort; Duloxetine; Metronidazole; and Rofecoxib  Home Medications   Prior to Admission medications   Medication Sig Start Date End Date Taking? Authorizing Provider  Albuterol Sulfate (PROAIR RESPICLICK) 767 (90 Base) MCG/ACT AEPB Inhale 2 puffs into the lungs every 6 (six) hours as needed. Patient taking differently: Inhale 2 puffs into the lungs every 6 (six) hours as needed (shortness of breath/ wheezing).  04/01/16  Yes [name], MD  benzonatate (TESSALON) 100 MG capsule TAKE 1 CAPSULE TWICE DAILY AS NEEDED FOR COUGH. 05/06/16  Yes [name], MD  Cholecalciferol 2000 UNITS TABS Take 2,000 Units by mouth daily.   Yes Historical Provider, MD  clorazepate (TRANXENE) 7.5 MG tablet Take 1 tablet (7.5 mg total) by mouth 2 (two) times daily as needed for anxiety. 04/01/16  Yes [name], MD  diltiazem (CARDIZEM CD) 240 MG 24 hr capsule TAKE (1) CAPSULE DAILY. Patient taking differently: TAKE (1) CAPSULE by mouth once DAILY. 05/27/16  Yes [name], MD  feeding supplement, ENSURE ENLIVE, (ENSURE ENLIVE) LIQD Take 237 mLs by mouth 2 (two) times daily  between meals. Patient taking differently: Take 237 mLs by mouth 2 (two) times daily as needed (poor appetite).  07/25/15  Yes Robbie Lis, MD  furosemide (LASIX) 20 MG tablet 1 daily if needed for significant lower extremity swelling Patient taking differently: Take 20 mg by mouth daily as needed for fluid. 1 daily if needed for significant lower extremity swelling 04/01/16  Yes [name], MD  levothyroxine (SYNTHROID, LEVOTHROID) 25 MCG tablet TAKE 1 TABLET DAILY BEFORE BREAKFAST. 04/16/16  Yes [name], MD  NEXIUM 40 MG capsule TAKE 1 CAPSULE DAILY BEFORE BREAKFAST. 05/20/16  Yes [name], MD  Polyethyl Glycol-Propyl Glycol (SYSTANE OP) Place 1 drop into both eyes 2 (two) times daily as needed (for dry eyes).    Yes Historical Provider, MD  promethazine (PHENERGAN) 25 MG tablet TAKE 1 TABLET EVERY 8 HOURS AS NEEDED FOR NAUSEA. 01/08/16  Yes [name], MD  sertraline (ZOLOFT) 25 MG tablet TAKE 1 TABLET EACH DAY. Patient taking differently: TAKE 1 TABLET by mouth once daily. 03/10/16  Yes [name], MD  tiotropium (SPIRIVA HANDIHALER) 18 MCG inhalation capsule Place 1 capsule (18 mcg total) into inhaler and inhale daily. 09/04/15  Yes [name], MD  traMADol (ULTRAM) 50 MG tablet TAKE 1 TABLET EVERY SIX HOURS AS NEEDED FOR PAIN. 04/01/16  Yes [name], MD  traZODone (DESYREL) 50 MG tablet TAKE 1/2 TO 1 TABLET AT BEDTIME AS NEEDED FOR REST. 03/19/16  Yes [name], MD  zolpidem (AMBIEN) 5 MG tablet Take 1 tablet (5 mg total) by mouth at bedtime as needed. Patient taking differently: Take 5 mg by mouth at bedtime as needed for sleep.  04/01/16  Yes [name], MD   BP 119/61 mmHg  Pulse 87  Temp(Src) 98.3 F (36.8 C) (Oral)  Resp 20  Ht '4\' 11"'$  (1.499 m)  Wt 116 lb 13.5 oz (53 kg)  BMI 23.59 kg/m2  SpO2 97% Physical Exam  Constitutional: She is oriented to person, place, and time. She appears well-developed and  well-nourished.  HENT:  Head: Normocephalic and atraumatic.  Neck: Normal range of motion.  Cardiovascular: Normal rate and regular rhythm.   Pulmonary/Chest: No stridor. No respiratory distress. She has wheezes.  Tachypnea  Abdominal: Soft. She exhibits no distension. There is no tenderness.  Musculoskeletal: Normal range of motion. She exhibits no edema or tenderness.  No cervical spine tenderness, thoracic spine tenderness or Lumbar spine tenderness. Does have some ttp to sacral area. No tenderness or pain with palpation and full ROM of all joints in upper and lower extremities.  No ecchymosis or other signs of trauma on back or extremities.  No Pain with AP or lateral compression of ribs.  Paracervical ttp, paraspinal ttp   Neurological: She is alert and oriented to person, place, and time.  Skin: Skin is warm and dry.  Nursing note and vitals reviewed.   ED Course  Procedures (including critical care time) Labs Review Labs Reviewed  CBC WITH DIFFERENTIAL/PLATELET - Abnormal; Notable for the following:    WBC 13.2 (*)    Neutro Abs 12.5 (*)    Lymphs Abs 0.6 (*)    All other components within normal limits  COMPREHENSIVE METABOLIC PANEL - Abnormal; Notable for the following:    Potassium 3.1 (*)    Glucose, Bld 192 (*)    GFR calc non Af Amer 58 (*)    All other components within normal limits  I-STAT CHEM 8, ED - Abnormal; Notable for the following:    Potassium 3.2 (*)    Glucose, Bld 192 (*)    All other components within normal limits  TROPONIN I  BRAIN NATRIURETIC PEPTIDE  CBC  CREATININE, SERUM  BASIC METABOLIC PANEL    Imaging Review Dg Chest 2 View  05/28/2016  CLINICAL DATA:  Short of breath with productive cough for 1 week. History of emphysema and hypertension. EXAM: CHEST  2 VIEW COMPARISON:  11/17/2015 FINDINGS: Cardiac silhouette is normal in size. No mediastinal or hilar masses or evidence of adenopathy. Lungs are hyperexpanded but clear. No pleural  effusion or pneumothorax. Bony thorax is intact. IMPRESSION: No acute cardiopulmonary disease. Hyperexpanded lungs consistent with COPD. Electronically Signed   By: Lajean Manes M.D.   On: 05/28/2016 17:53   Ct Pelvis Wo Contrast  05/28/2016  CLINICAL DATA:  Patient's chair slipped out from under her. C/o tailbone pain. Eval for fx. EXAM: CT PELVIS WITHOUT CONTRAST TECHNIQUE: Multidetector CT imaging of the pelvis was performed following the standard protocol without intravenous contrast. COMPARISON:  CT abdomen pelvis, 11/17/2015 FINDINGS: No fracture.  No bone lesion. Hip joints are normally spaced and aligned. There is subchondral cystic change along the superior right acetabulum. No other arthropathic change. No hip joint effusions. SI joints and symphysis pubis are normally spaced and aligned. Uterus is surgically absent. No pelvic masses or abnormal fluid collections. There is diverticulosis along the visualize left colon. No diverticulitis. No masses or adenopathy. Atherosclerotic calcification noted along the lower aorta and the iliac vessels. Surrounding soft tissues are unremarkable. IMPRESSION: 1. No fracture or acute bony abnormality.  No dislocation. 2. Left colon diverticulosis. 3. Aortic and iliac vessel atherosclerosis. Electronically Signed   By: Lajean Manes M.D.   On: 05/28/2016 17:57   I have personally reviewed and evaluated these images and lab results as part of my medical decision-making.   EKG Interpretation None      MDM   Final diagnoses:  COPD exacerbation ([place])  Fall, initial encounter   Ct pelvis to eval for fx Duoneb/steroids/cxr/abx for dyspnea/wheezing that is likely related to copd exacerbation Multiple breathing treatments steroids and antibiotics patient still with persistent hypoxia when he relating and significant tachypnea. We'll admit for further evaluation. Images negative for any acute abnormalities secondary to the fall.    Merrily Pew,  MD 05/29/16 [id number]

## 2016-05-29 ENCOUNTER — Other Ambulatory Visit: Payer: Self-pay | Admitting: Internal Medicine

## 2016-05-29 ENCOUNTER — Encounter (HOSPITAL_COMMUNITY): Payer: Self-pay | Admitting: Internal Medicine

## 2016-05-29 DIAGNOSIS — R1310 Dysphagia, unspecified: Secondary | ICD-10-CM

## 2016-05-29 DIAGNOSIS — K589 Irritable bowel syndrome without diarrhea: Secondary | ICD-10-CM | POA: Diagnosis present

## 2016-05-29 DIAGNOSIS — E785 Hyperlipidemia, unspecified: Secondary | ICD-10-CM | POA: Diagnosis present

## 2016-05-29 DIAGNOSIS — I2510 Atherosclerotic heart disease of native coronary artery without angina pectoris: Secondary | ICD-10-CM | POA: Diagnosis present

## 2016-05-29 DIAGNOSIS — K219 Gastro-esophageal reflux disease without esophagitis: Secondary | ICD-10-CM

## 2016-05-29 DIAGNOSIS — F411 Generalized anxiety disorder: Secondary | ICD-10-CM | POA: Diagnosis not present

## 2016-05-29 DIAGNOSIS — R0902 Hypoxemia: Secondary | ICD-10-CM | POA: Diagnosis present

## 2016-05-29 DIAGNOSIS — E039 Hypothyroidism, unspecified: Secondary | ICD-10-CM | POA: Diagnosis not present

## 2016-05-29 DIAGNOSIS — R0602 Shortness of breath: Secondary | ICD-10-CM | POA: Diagnosis not present

## 2016-05-29 DIAGNOSIS — I10 Essential (primary) hypertension: Secondary | ICD-10-CM | POA: Diagnosis not present

## 2016-05-29 DIAGNOSIS — J9620 Acute and chronic respiratory failure, unspecified whether with hypoxia or hypercapnia: Secondary | ICD-10-CM | POA: Diagnosis not present

## 2016-05-29 DIAGNOSIS — M25552 Pain in left hip: Secondary | ICD-10-CM | POA: Diagnosis present

## 2016-05-29 DIAGNOSIS — G8929 Other chronic pain: Secondary | ICD-10-CM | POA: Diagnosis present

## 2016-05-29 DIAGNOSIS — Z825 Family history of asthma and other chronic lower respiratory diseases: Secondary | ICD-10-CM | POA: Diagnosis not present

## 2016-05-29 DIAGNOSIS — J441 Chronic obstructive pulmonary disease with (acute) exacerbation: Secondary | ICD-10-CM | POA: Diagnosis not present

## 2016-05-29 DIAGNOSIS — W07XXXA Fall from chair, initial encounter: Secondary | ICD-10-CM | POA: Diagnosis present

## 2016-05-29 LAB — CBC
HCT: 35.9 % — ABNORMAL LOW (ref 36.0–46.0)
Hemoglobin: 11.6 g/dL — ABNORMAL LOW (ref 12.0–15.0)
MCH: 28.6 pg (ref 26.0–34.0)
MCHC: 32.3 g/dL (ref 30.0–36.0)
MCV: 88.4 fL (ref 78.0–100.0)
Platelets: 354 10*3/uL (ref 150–400)
RBC: 4.06 MIL/uL (ref 3.87–5.11)
RDW: 13.4 % (ref 11.5–15.5)
WBC: 9.1 10*3/uL (ref 4.0–10.5)

## 2016-05-29 LAB — BASIC METABOLIC PANEL
Anion gap: 11 (ref 5–15)
BUN: 11 mg/dL (ref 6–20)
CO2: 25 mmol/L (ref 22–32)
Calcium: 9.1 mg/dL (ref 8.9–10.3)
Chloride: 102 mmol/L (ref 101–111)
Creatinine, Ser: 0.98 mg/dL (ref 0.44–1.00)
GFR calc Af Amer: >60 mL/min (ref >60)
GFR calc non Af Amer: 54 mL/min — ABNORMAL LOW (ref >60)
Glucose, Bld: 199 mg/dL — ABNORMAL HIGH (ref 65–99)
Potassium: 3.4 mmol/L — ABNORMAL LOW (ref 3.5–5.1)
Sodium: 138 mmol/L (ref 135–145)

## 2016-05-29 LAB — BRAIN NATRIURETIC PEPTIDE: B Natriuretic Peptide: 45.2 pg/mL (ref 0.0–100.0)

## 2016-05-29 LAB — TSH: TSH: 0.599 u[IU]/mL (ref 0.350–4.500)

## 2016-05-29 LAB — T4, FREE: Free T4: 0.83 ng/dL (ref 0.61–1.12)

## 2016-05-29 MED ORDER — DOCUSATE SODIUM 100 MG PO CAPS
100.0000 mg | ORAL_CAPSULE | Freq: Two times a day (BID) | ORAL | Status: DC
  Administered 2016-05-29 – 2016-06-01 (×8): 100 mg via ORAL
  Filled 2016-05-29 (×10): qty 1

## 2016-05-29 MED ORDER — MORPHINE SULFATE (PF) 2 MG/ML IV SOLN: 1.0000 mg | INTRAVENOUS | Status: DC | PRN

## 2016-05-29 MED ORDER — TRAZODONE HCL 50 MG PO TABS
50.0000 mg | ORAL_TABLET | Freq: Every evening | ORAL | Status: DC | PRN
  Administered 2016-05-29 – 2016-05-31 (×3): 50 mg via ORAL
  Filled 2016-05-29 (×3): qty 1

## 2016-05-29 MED ORDER — ENOXAPARIN SODIUM 40 MG/0.4ML ~~LOC~~ SOLN
40.0000 mg | SUBCUTANEOUS | Status: DC
  Administered 2016-05-29 – 2016-06-01 (×4): 40 mg via SUBCUTANEOUS
  Filled 2016-05-29 (×4): qty 0.4

## 2016-05-29 MED ORDER — MORPHINE SULFATE (PF) 2 MG/ML IV SOLN: 2.0000 mg | INTRAVENOUS | Status: DC | PRN

## 2016-05-29 MED ORDER — PANTOPRAZOLE SODIUM 40 MG PO TBEC
40.0000 mg | DELAYED_RELEASE_TABLET | Freq: Every day | ORAL | Status: DC
  Administered 2016-05-29 – 2016-06-01 (×4): 40 mg via ORAL
  Filled 2016-05-29 (×4): qty 1

## 2016-05-29 MED ORDER — DILTIAZEM HCL ER COATED BEADS 240 MG PO CP24
240.0000 mg | ORAL_CAPSULE | Freq: Every day | ORAL | Status: DC
  Administered 2016-05-29 – 2016-06-01 (×3): 240 mg via ORAL
  Filled 2016-05-29 (×4): qty 1

## 2016-05-29 MED ORDER — ACETAMINOPHEN 325 MG PO TABS
650.0000 mg | ORAL_TABLET | Freq: Four times a day (QID) | ORAL | Status: DC | PRN
  Filled 2016-05-29: qty 2

## 2016-05-29 MED ORDER — BISACODYL 10 MG RE SUPP
10.0000 mg | Freq: Every day | RECTAL | Status: DC | PRN
  Administered 2016-05-31: 10 mg via RECTAL
  Filled 2016-05-29: qty 1

## 2016-05-29 MED ORDER — ALBUTEROL SULFATE (2.5 MG/3ML) 0.083% IN NEBU
2.5000 mg | INHALATION_SOLUTION | RESPIRATORY_TRACT | Status: DC | PRN
  Administered 2016-06-01: 2.5 mg via RESPIRATORY_TRACT
  Filled 2016-05-29: qty 3

## 2016-05-29 MED ORDER — ENSURE ENLIVE PO LIQD
237.0000 mL | Freq: Two times a day (BID) | ORAL | Status: DC
  Administered 2016-05-29 – 2016-06-01 (×5): 237 mL via ORAL

## 2016-05-29 MED ORDER — METHYLPREDNISOLONE SODIUM SUCC 125 MG IJ SOLR
60.0000 mg | Freq: Two times a day (BID) | INTRAMUSCULAR | Status: DC
  Administered 2016-05-29 – 2016-05-31 (×4): 60 mg via INTRAVENOUS
  Filled 2016-05-29 (×6): qty 0.96

## 2016-05-29 MED ORDER — LEVOTHYROXINE SODIUM 25 MCG PO TABS
25.0000 ug | ORAL_TABLET | Freq: Every day | ORAL | Status: DC
  Administered 2016-05-29 – 2016-06-01 (×4): 25 ug via ORAL
  Filled 2016-05-29 (×5): qty 1

## 2016-05-29 MED ORDER — PROMETHAZINE HCL 25 MG PO TABS
25.0000 mg | ORAL_TABLET | Freq: Four times a day (QID) | ORAL | Status: DC | PRN
  Administered 2016-05-29 – 2016-06-01 (×7): 25 mg via ORAL
  Filled 2016-05-29 (×7): qty 1

## 2016-05-29 MED ORDER — FLEET ENEMA 7-19 GM/118ML RE ENEM
1.0000 | ENEMA | Freq: Once | RECTAL | Status: AC | PRN
  Administered 2016-05-31: 1 via RECTAL
  Filled 2016-05-29: qty 1

## 2016-05-29 MED ORDER — SENNOSIDES-DOCUSATE SODIUM 8.6-50 MG PO TABS
1.0000 | ORAL_TABLET | Freq: Every evening | ORAL | Status: DC | PRN
  Administered 2016-05-30 – 2016-05-31 (×2): 1 via ORAL
  Filled 2016-05-29 (×2): qty 1

## 2016-05-29 MED ORDER — IPRATROPIUM-ALBUTEROL 0.5-2.5 (3) MG/3ML IN SOLN
3.0000 mL | Freq: Four times a day (QID) | RESPIRATORY_TRACT | Status: DC
  Administered 2016-05-29: 3 mL via RESPIRATORY_TRACT
  Filled 2016-05-29: qty 3

## 2016-05-29 MED ORDER — SODIUM CHLORIDE 0.9 % IV SOLN
INTRAVENOUS | Status: DC
  Administered 2016-05-29 – 2016-05-31 (×3): via INTRAVENOUS

## 2016-05-29 MED ORDER — IPRATROPIUM-ALBUTEROL 0.5-2.5 (3) MG/3ML IN SOLN
3.0000 mL | Freq: Three times a day (TID) | RESPIRATORY_TRACT | Status: DC
  Administered 2016-05-29 – 2016-06-01 (×11): 3 mL via RESPIRATORY_TRACT
  Filled 2016-05-29 (×11): qty 3

## 2016-05-29 MED ORDER — OXYCODONE HCL 5 MG PO TABS
5.0000 mg | ORAL_TABLET | ORAL | Status: DC | PRN
  Administered 2016-05-29 – 2016-06-01 (×11): 5 mg via ORAL
  Filled 2016-05-29 (×11): qty 1

## 2016-05-29 MED ORDER — AZITHROMYCIN 250 MG PO TABS
250.0000 mg | ORAL_TABLET | Freq: Every day | ORAL | Status: AC
  Administered 2016-05-29 – 2016-06-01 (×4): 250 mg via ORAL
  Filled 2016-05-29 (×4): qty 1

## 2016-05-29 MED ORDER — SODIUM CHLORIDE 0.9% FLUSH
3.0000 mL | Freq: Two times a day (BID) | INTRAVENOUS | Status: DC
  Administered 2016-05-29 – 2016-05-30 (×4): 3 mL via INTRAVENOUS

## 2016-05-29 MED ORDER — PREDNISONE 20 MG PO TABS
40.0000 mg | ORAL_TABLET | Freq: Every day | ORAL | Status: DC
  Administered 2016-05-29: 40 mg via ORAL
  Filled 2016-05-29 (×2): qty 2

## 2016-05-29 MED ORDER — CLORAZEPATE DIPOTASSIUM 7.5 MG PO TABS
7.5000 mg | ORAL_TABLET | Freq: Two times a day (BID) | ORAL | Status: DC | PRN
  Administered 2016-05-29: 7.5 mg via ORAL
  Filled 2016-05-29: qty 1

## 2016-05-29 MED ORDER — SERTRALINE HCL 25 MG PO TABS
25.0000 mg | ORAL_TABLET | Freq: Every day | ORAL | Status: DC
  Administered 2016-05-29 – 2016-06-01 (×4): 25 mg via ORAL
  Filled 2016-05-29 (×4): qty 1

## 2016-05-29 NOTE — Care Management Note (Signed)
Case Management Note  Patient Details  Name: Heather [name] MRN: [id number] Date of Birth: [birth year]  Subjective/Objective: 81 y/o f admitted w/COPD. From home. PT cons-await recc.                   Action/Plan:d/c plan home.   Expected Discharge Date:                  Expected Discharge Plan:  [place]  In-House Referral:     Discharge planning Services  CM Consult  Post Acute Care Choice:    Choice offered to:     DME Arranged:    DME Agency:     HH Arranged:    [place] Agency:     Status of Service:  In process, will continue to follow  If discussed at Long Length of Stay Meetings, dates discussed:    Additional Comments:  Dessa Phi, RN 05/29/2016, 1:07 PM

## 2016-05-29 NOTE — Care Management Obs Status (Signed)
[place] NOTIFICATION   Patient Details  Name: NADJA LINA MRN: [id number] Date of Birth: [birth year]   Medicare Observation Status Notification Given:  Yes    MahabirJuliann Pulse, RN 05/29/2016, 1:07 PM

## 2016-05-29 NOTE — Progress Notes (Signed)
Patient does not meet criteria for COPD gold. Patient must have three or more admissions to he hospital in the last six months with a primary or secondary diagnosis of COPD.   Kathrin Greathouse, Latanya Presser, MSW Clinical Social Worker 5E and Psychiatric Service Line [phone] 05/29/2016  8:02 AM

## 2016-05-29 NOTE — Progress Notes (Signed)
PROGRESS NOTE    Heather [name]  JEH:[id number] DOB: 1935-01-24 DOA: 05/28/2016 PCP: [name], MD   [facility]     Brief Narrative:  Heather [name] is a 81 y.o. female with medical history significant of COPD (reported by patient but not documented); IBS; chronic pain; and multiple other medical problems . She reports that she has been in bed since Sunday with cough, using inhalers as instructed but symptoms kept getting worse. She was considering an ER evaluation anyway when her telephone rang and she got up to answer it and her chair slipped out from underneath her. Her feet went straight up and it hurt her back/tailbone badly. Due to ongoing pain she called 911. She was very SOB while on the floor waiting for EMS. They provided a breathing treatment and then got her out of the floor. She does check her pulse ox daily and the last few days it has been down to 85%. Has chronic problems with L hip and the pain is worse now after the fall. Monday, temp was 100.4; otherwise no fevers. Has been feeling diaphoretic and clammy. +cough - productive of yellow sputum. +wheezing - ambulance driver told her they could hear her wheezing from down the hall. Has a chronic swallowing problem - able to eat small bites but has solid and pill dysphagia (takes pills with pudding) but no liquid dysphagia. Also has IBS and she had laxative changed within the last few months with no improvement in symptoms.    Assessment & Plan:   Principal Problem:   COPD exacerbation ([place]) Active Problems:   Hypothyroidism   Anxiety state   Essential hypertension   GERD   HIP PAIN, LEFT   Dysphagia   COPD Exacerbation - -Nebulizers: scheduled Duoneb and prn albuterol -IV solumedrol -Oral azithromycin for 5 days.  -O2 supplementation as needed for hypoxia/tachypnea/DOE -PT eval in AM  Hypothyroidism - Continue Synthroid. No recent (since 2014) testing appreciated  in EMR  -TSH/free t 4 normal  Anxiety - Continue Sertraline, Tranxene, Trazodone.  HTN - Continue Cardizem - although this is a somewhat unusual option for HTN monotherapy.  GERD, current dysphagia -  PPI Patient with c/o persistent dysphagia that has not apparently been evaluated   Hip pain - CT negative for fracture following fall but now hip pain is worse and she also has sacral pain. PT eval in AM  DVT prophylaxis:  Lovenox  Code Status: Full Code   Family Communication: No family at bedside  Disposition Plan:     Consultants:     Procedures:        Subjective: C/o SOB and feeling like she needs IVF  Objective: Filed Vitals:   05/29/16 0116 05/29/16 0217 05/29/16 0450 05/29/16 0914  BP: 119/61  128/53   Pulse: 87  83   Temp: 98.3 F (36.8 C)  98 F (36.7 C)   TempSrc: Oral  Oral   Resp: 20  22   Height: '4\' 11"'$  (1.499 m)     Weight: 53 kg (116 lb 13.5 oz)     SpO2: 97% 98% 97% 97%   No intake or output data in the 24 hours ending 05/29/16 1112 Filed Weights   05/29/16 0116  Weight: 53 kg (116 lb 13.5 oz)    Examination:  General exam: Anxious appearing Respiratory system: Clear to auscultation. Respiratory effort normal. Cardiovascular system: S1 & S2 heard, RRR. No JVD, murmurs, rubs, gallops or clicks. No pedal edema. Gastrointestinal system: Abdomen  is nondistended, soft and nontender. No organomegaly or masses felt. Normal bowel sounds heard. Central nervous system: anxious     Data Reviewed: I have personally reviewed following labs and imaging studies  CBC:  Recent Labs Lab 05/28/16 2302 05/28/16 2312 05/29/16 0442  WBC 13.2*  --  9.1  NEUTROABS 12.5*  --   --   HGB 12.4 13.3 11.6*  HCT 36.8 39.0 35.9*  MCV 86.2  --  88.4  PLT 378  --  540   Basic Metabolic Panel:  Recent Labs Lab 05/28/16 2302 05/28/16 2312 05/29/16 0442  NA 139 140 138  K 3.1* 3.2* 3.4*  CL 102 101 102  CO2 23  --  25  GLUCOSE 192* 192*  199*  BUN '9 7 11  '$ CREATININE 0.92 0.80 0.98  CALCIUM 9.3  --  9.1   GFR: Estimated Creatinine Clearance: 35.2 mL/min (by C-G formula based on Cr of 0.98). Liver Function Tests:  Recent Labs Lab 05/28/16 2302  AST 31  ALT 18  ALKPHOS 93  BILITOT 0.4  PROT 7.4  ALBUMIN 4.0   No results for input(s): LIPASE, AMYLASE in the last 168 hours. No results for input(s): AMMONIA in the last 168 hours. Coagulation Profile: No results for input(s): INR, PROTIME in the last 168 hours. Cardiac Enzymes:  Recent Labs Lab 05/28/16 2302  TROPONINI <0.03   BNP (last 3 results) No results for input(s): PROBNP in the last 8760 hours. HbA1C: No results for input(s): HGBA1C in the last 72 hours. CBG: No results for input(s): GLUCAP in the last 168 hours. Lipid Profile: No results for input(s): CHOL, HDL, LDLCALC, TRIG, CHOLHDL, LDLDIRECT in the last 72 hours. Thyroid Function Tests:  Recent Labs  05/29/16 0445  TSH 0.599  FREET4 0.83   Anemia Panel: No results for input(s): VITAMINB12, FOLATE, FERRITIN, TIBC, IRON, RETICCTPCT in the last 72 hours. Urine analysis:    Component Value Date/Time   COLORURINE AMBER* 08/22/2014 1105   APPEARANCEUR CLEAR 08/22/2014 1105   LABSPEC 1.011 08/22/2014 1105   PHURINE 6.0 08/22/2014 1105   GLUCOSEU NEGATIVE 08/22/2014 1105   HGBUR NEGATIVE 08/22/2014 1105   HGBUR trace-lysed 11/15/2010 1046   BILIRUBINUR NEGATIVE 08/22/2014 1105   KETONESUR NEGATIVE 08/22/2014 1105   PROTEINUR NEGATIVE 08/22/2014 1105   UROBILINOGEN 0.2 08/22/2014 1105   NITRITE NEGATIVE 08/22/2014 1105   LEUKOCYTESUR NEGATIVE 08/22/2014 1105    )No results found for this or any previous visit (from the past 240 hour(s)).    Anti-infectives    Start     Dose/Rate Route Frequency Ordered Stop   05/29/16 1000  azithromycin (ZITHROMAX) tablet 250 mg     250 mg Oral Daily 05/29/16 0136 06/02/16 0959   05/28/16 1700  azithromycin (ZITHROMAX) tablet 500 mg     500 mg  Oral  Once 05/28/16 1652 05/28/16 1701       Radiology Studies: Dg Chest 2 View  05/28/2016  CLINICAL DATA:  Short of breath with productive cough for 1 week. History of emphysema and hypertension. EXAM: CHEST  2 VIEW COMPARISON:  11/17/2015 FINDINGS: Cardiac silhouette is normal in size. No mediastinal or hilar masses or evidence of adenopathy. Lungs are hyperexpanded but clear. No pleural effusion or pneumothorax. Bony thorax is intact. IMPRESSION: No acute cardiopulmonary disease. Hyperexpanded lungs consistent with COPD. Electronically Signed   By: Lajean Manes M.D.   On: 05/28/2016 17:53   Ct Pelvis Wo Contrast  05/28/2016  CLINICAL DATA:  Patient's chair slipped out  from under her. C/o tailbone pain. Eval for fx. EXAM: CT PELVIS WITHOUT CONTRAST TECHNIQUE: Multidetector CT imaging of the pelvis was performed following the standard protocol without intravenous contrast. COMPARISON:  CT abdomen pelvis, 11/17/2015 FINDINGS: No fracture.  No bone lesion. Hip joints are normally spaced and aligned. There is subchondral cystic change along the superior right acetabulum. No other arthropathic change. No hip joint effusions. SI joints and symphysis pubis are normally spaced and aligned. Uterus is surgically absent. No pelvic masses or abnormal fluid collections. There is diverticulosis along the visualize left colon. No diverticulitis. No masses or adenopathy. Atherosclerotic calcification noted along the lower aorta and the iliac vessels. Surrounding soft tissues are unremarkable. IMPRESSION: 1. No fracture or acute bony abnormality.  No dislocation. 2. Left colon diverticulosis. 3. Aortic and iliac vessel atherosclerosis. Electronically Signed   By: Lajean Manes M.D.   On: 05/28/2016 17:57        Scheduled Meds: . azithromycin  250 mg Oral Daily  . diltiazem  240 mg Oral Daily  . docusate sodium  100 mg Oral BID  . enoxaparin (LOVENOX) injection  40 mg Subcutaneous Q24H  .  ipratropium-albuterol  3 mL Nebulization TID  . levothyroxine  25 mcg Oral QAC breakfast  . methylPREDNISolone (SOLU-MEDROL) injection  60 mg Intravenous Q12H  . pantoprazole  40 mg Oral Daily  . sertraline  25 mg Oral Daily  . sodium chloride flush  3 mL Intravenous Q12H   Continuous Infusions:       Time spent: 25 min    [place], DO [facility] Pager [phone]  If 7PM-7AM, please contact night-coverage [email or web address] Password [facility] 05/29/2016, 11:12 AM

## 2016-05-29 NOTE — Progress Notes (Signed)
Initial Nutrition Assessment  DOCUMENTATION CODES:   Not applicable  INTERVENTION:  - Will order Ensure Enlive po BID, each supplement provides 350 kcal and 20 grams of protein. - Will order Magic Cup once/day in [employer] [facility], this supplement provides 290 kcal and 9 grams of protein. - Provide antinausea medication 30 minutes before meal per pt request. - RD will continue to monitor for needs.  NUTRITION DIAGNOSIS:   Inadequate oral intake related to nausea, acute illness as evidenced by per patient/family report.  GOAL:   Patient will meet greater than or equal to 90% of their needs  MONITOR:   PO intake, Supplement acceptance, Weight trends, Labs, Skin, I & O's  REASON FOR ASSESSMENT:   Malnutrition Screening Tool, Consult Assessment of nutrition requirement/status  ASSESSMENT:   81 y.o. female with medical history significant of COPD (reported by patient but not documented); IBS; chronic pain; and multiple other medical problems . She reports that she has been in bed since Sunday with cough, using inhalers as instructed but symptoms kept getting worse. She was considering an ER evaluation anyway when her telephone rang and she got up to answer it and her chair slipped out from underneath her. Her feet went straight up and it hurt her back/tailbone badly. Due to ongoing pain she called 911. She was very SOB while on the floor waiting for EMS. They provided a breathing treatment and then got her out of the floor. She does check her pulse ox daily and the last few days it has been down to 85%. Has chronic problems with L hip and the pain is worse now after the fall. Monday, temp was 100.4; otherwise no fevers. Has been feeling diaphoretic and clammy. +cough - productive of yellow sputum. +wheezing - ambulance driver told her they could hear her wheezing from down the hall. Has a chronic swallowing problem - able to eat small bites but has solid and pill dysphagia (takes  pills with pudding) but no liquid dysphagia. Also has IBS and she had laxative changed within the last few months with no improvement in symptoms.   Pt seen for MST and consult. BMI indicates normal weight. No intakes documented since admission. Pt reports she had a cup of ice cream early this AM and another cup of ice cream was provided by this RD per pt request at the end of visit. Pt reports she was unable to eat more than a few bites of eggs and grits with butter this AM due to not receiving antinausea medication in adequate time. Pt reports that she has been taking this medication "for a long time" at home and takes it 30 minutes before breakfast each day which lasts her throughout the day.  PTA, per her report, she had a fair appetite. She would eat breakfast ~1030, eat a snack ~1500, and eat dinner later in the evening. She was also drinking Ensure each day and intake of the supplement varied based on intakes of other foods. Pt also eats ice cream every night before bed. Pt states that for the past few months she has had increased difficulty with swallowing; this occurs with both foods and liquids and has gotten progressively worse with no alleviating factors. Pt denies chewing difficulty and she is unsure if she has increased SOB with eating/drinking as she has "not paid attention to that" previously. Pt states she takes vitamin D supplement daily.   Pt states that she weighs herself daily and that UBW 3 months ago was  119.5 lbs and that CBW was has been ~14.1 lbs. This would indicate 5.4 lb weight loss (4.5% body weight) in the past 3 months which is not significant for time frame. No muscle or fat wasting noted during physical assessment.  Will order supplements as outlined above and continue to monitor for additional needs. Medications reviewed. Labs reviewed; K: 3.4 mmol/L, GFR: 54 mL/min.     Diet Order:  Diet regular Room service appropriate?: Yes; Fluid consistency:: Thin  Skin:   Reviewed, no issues  Last BM:  PTA  Height:   Ht Readings from Last 1 Encounters:  05/29/16 '4\' 11"'$  (1.499 m)    Weight:   Wt Readings from Last 1 Encounters:  05/29/16 116 lb 13.5 oz (53 kg)    Ideal Body Weight:  42.91 kg (kg)  BMI:  Body mass index is 23.59 kg/(m^2).  Estimated Nutritional Needs:   Kcal:  1325-1540 (25-29 kcal/kg)  Protein:  50-60 grams  Fluid:  >/= 1.6 L/day  EDUCATION NEEDS:   No education needs identified at this time     Jarome Matin, MS, RD, LDN Inpatient Clinical Dietitian Pager # [phone] After hours/weekend pager # [phone]

## 2016-05-29 NOTE — Progress Notes (Signed)
PT Cancellation Note  Patient Details Name: Heather [name] MRN: [id number] DOB: 1935/08/30   Cancelled Treatment:    Reason Eval/Treat Not Completed: Medical issues which prohibited therapy-spoke with MD who recommended PT be held today.    Weston Anna, MPT Pager: [phone]

## 2016-05-29 NOTE — Evaluation (Addendum)
Clinical/Bedside Swallow Evaluation Patient Details  Name: Heather [name] MRN: [id number] Date of Birth: 03/11/1935  Today's Date: 05/29/2016 Time: SLP Start Time (ACUTE ONLY): 1645 SLP Stop Time (ACUTE ONLY): 1720 SLP Time Calculation (min) (ACUTE ONLY): 35 min  Past Medical History:  Past Medical History  Diagnosis Date  . ANXIETY 07/23/2009  . DEPRESSION 07/23/2009  . DIVERTICULOSIS, COLON 07/23/2009  . GERD 07/23/2009  . HIP PAIN, LEFT 05/16/2010  . HYPERLIPIDEMIA 07/23/2009  . HYPERTENSION 07/23/2009  . HYPOTHYROIDISM 07/23/2009  . LOW BACK PAIN, CHRONIC 10/01/2009  . PARESTHESIA 10/01/2009  . IBS (irritable bowel syndrome)   . History of Crohn's disease   . Chronic pain   . Insomnia   . Headache(784.0) 07/23/2009    occasional  . Shingles 2006    back  . Hemorrhoids   . Complication of anesthesia 7 or 8 yrs ago    woke up during colonscopy  . Atherosclerosis of aorta ([place]) 07/24/2015  . Coronary atherosclerosis 07/24/2015   Past Surgical History:  Past Surgical History  Procedure Laterality Date  . Tonsillectomy  yrs ago  . Hemorrhoid surgery  yrs ago  . Appendectomy  yrs ago  . Bilateral salpingooph[age]omy  age 64 or 70  . Abdominal hysterectomy  age 48 or 62  . Esophagogastroduodenoscopy N/A 11/17/2013    Procedure: ESOPHAGOGASTRODUODENOSCOPY (EGD);  Surgeon: Cleotis Nipper, MD;  Location: [facility];  Service: Endoscopy;  Laterality: N/A;  . Colonoscopy N/A 11/17/2013    Procedure: COLONOSCOPY;  Surgeon: Cleotis Nipper, MD;  Location: [facility];  Service: Endoscopy;  Laterality: N/A;   HPI:  81 yo female adm to [facility] after falling - has h/o COPD and esophageal dysmotility.  Swallow evaluation ordered.  Pt known to this SLP from prior evaluation August 2016.  Pt reports she is no longer able to have esophagus stretched due to it being "too thin", she also states last EGD did not improve her swallowing   Assessment / Plan / Recommendation Clinical  Impression  Pt known to this SLP from prior evaluation.  Pt continues to present with symptoms consistent with prior known esophageal dysmotility.  Did not observe pt with solids as pt was side lying due to tailbone pain from fall.  Consumption of water was functional re: oropharyngeal swallow ability.  SLP reviewed with pt and son prior clinical evaluation and prior esophagram.  Provided pt with recipies for liquid nutrition to help maximize nutrition.  Pt again advises that she eats two meals a day mostly = highly stressed recommendation for small frequent meals for nutrition and to help mitigate esophageal deficits.  Pt again admitted symptoms occur approximately after 1/4 way into meal, therefore modification may decrease incidents. Pt reports she continues pills with applesauce/puree - advised to start intake with liquids also.  Son present and educated as well, he reports he will help mother with liquid supplements.  As all education completed to help mitigate pt's chronic esophageal dysphagia, SLP will sign off.  Recommend pt not have SLP referral again as her issues are esophageal and she is compensating as best able.      Aspiration Risk  Moderate aspiration risk (due to dysmotility)    Diet Recommendation Regular;Thin liquid;Nectar-thick liquid   Liquid Administration via: Cup Medication Administration: Whole meds with puree Supervision: Patient able to self feed Compensations: Slow rate;Small sips/bites;Follow solids with liquid    Other  Recommendations     Follow up Recommendations       Frequency and  Duration            Prognosis        Swallow Study   General Date of Onset: 05/29/16 HPI: 81 yo female adm to [facility] after falling - has h/o COPD and esophageal dysmotility.  Swallow evaluation ordered.  Pt known to this SLP from prior evaluation August 2016.  Pt reports she is no longer able to have esophagus stretched due to it being "too thin", she also states last EGD did not  improve her swallowing Type of Study: Bedside Swallow Evaluation Previous Swallow Assessment: esophagram, 2015 dysmotility Diet Prior to this Study: Regular;Thin liquids Temperature Spikes Noted: No Respiratory Status: Nasal cannula History of Recent Intubation: No Behavior/Cognition: Alert Oral Cavity Assessment: Within Functional Limits Oral Care Completed by SLP: No Oral Cavity - Dentition: Adequate natural dentition Vision: Functional for self-feeding Self-Feeding Abilities: Able to feed self Patient Positioning: Partially reclined (pt lying on her side due to pain on tail bone from fall) Baseline Vocal Quality: Normal Volitional Cough: Strong Volitional Swallow: Able to elicit    Oral/Motor/Sensory Function Overall Oral Motor/Sensory Function: Within functional limits   Ice Chips Ice chips: Not tested   Thin Liquid Thin Liquid: Within functional limits Presentation: Self Fed;Straw    Nectar Thick Nectar Thick Liquid: Not tested   Honey Thick Honey Thick Liquid: Not tested   Puree Puree: Not tested   Solid   GO   Solid: Not tested        Luanna Salk, [place] [facility] SLP [phone]

## 2016-05-30 DIAGNOSIS — J9620 Acute and chronic respiratory failure, unspecified whether with hypoxia or hypercapnia: Secondary | ICD-10-CM

## 2016-05-30 MED ORDER — BISACODYL 5 MG PO TBEC: 5.0000 mg | DELAYED_RELEASE_TABLET | Freq: Every day | ORAL | Status: DC | PRN

## 2016-05-30 NOTE — Progress Notes (Signed)
PROGRESS NOTE    Heather [name]  OYD:[id number] DOB: [birth year] DOA: 05/28/2016 PCP: [name], MD   [facility]   Brief Narrative:  Heather [name] is a 81 y.o. female with medical history significant of COPD (reported by patient but not documented); IBS; chronic pain; and multiple other medical problems . She reports that she has been in bed since Sunday with cough, using inhalers as instructed but symptoms kept getting worse. She was considering an ER evaluation anyway when her telephone rang and she got up to answer it and her chair slipped out from underneath her. Her feet went straight up and it hurt her back/tailbone badly. Due to ongoing pain she called 911. She was very SOB while on the floor waiting for EMS. They provided a breathing treatment and then got her out of the floor. She does check her pulse ox daily and the last few days it has been down to 85%. Has chronic problems with L hip and the pain is worse now after the fall. Monday, temp was 100.4; otherwise no fevers. Has been feeling diaphoretic and clammy. +cough - productive of yellow sputum. +wheezing - ambulance driver told her they could hear her wheezing from down the hall. Has a chronic swallowing problem - able to eat small bites but has solid and pill dysphagia (takes pills with pudding) but no liquid dysphagia. Also has IBS and she had laxative changed within the last few months with no improvement in symptoms.    Assessment & Plan:   Principal Problem:   COPD exacerbation ([place]) Active Problems:   Hypothyroidism   Anxiety state   Essential hypertension   GERD   HIP PAIN, LEFT   Dysphagia   Hypoxia   COPD Exacerbation - Very minimal improvement reported. Low threshold to consult pulmonary team - Continue Nebulizers.-IV solumedrol - Antibiotics.  -O2 supplementation as needed for hypoxia/tachypnea/DOE - Hypothyroidism - Continue Synthroid.  Anxiety - Continue  Sertraline, Tranxene, Trazodone. -HTN - Optimize.      - Hip pain - CT negative for fracture following fall but now hip pain is worse and she also has sacral pain. PT input.  DVT prophylaxis:  Lovenox  Code Status: Full Code  Subjective: Reports very minimal improvement. Cough and sputum production have resolved.  Objective: Filed Vitals:   05/30/16 0842 05/30/16 0954 05/30/16 1340 05/30/16 1452  BP:  131/44  136/60  Pulse:  88    Temp:  98 F (36.7 C)    TempSrc:  Oral    Resp:  20    Height:      Weight:      SpO2: 94% 96% 94% 93%    Intake/Output Summary (Last 24 hours) at 05/30/16 1552 Last data filed at 05/30/16 0900  Gross per 24 hour  Intake    120 ml  Output      0 ml  Net    120 ml   Filed Weights   05/29/16 0116  Weight: 53 kg (116 lb 13.5 oz)    Examination:  General exam: Chronically ill looking.  Respiratory system: Very decreased air entry. Cardiovascular system: S1 & S2. Gastrointestinal system: Abdomen is nondistended, soft and nontender.   Central nervous system: Awake and alert. Moves all limbs. Extremities - No leg edema.  Data Reviewed: I have personally reviewed following labs and imaging studies  CBC:  Recent Labs Lab 05/28/16 2302 05/28/16 2312 05/29/16 0442  WBC 13.2*  --  9.1  NEUTROABS 12.5*  --   --  HGB 12.4 13.3 11.6*  HCT 36.8 39.0 35.9*  MCV 86.2  --  88.4  PLT 378  --  355   Basic Metabolic Panel:  Recent Labs Lab 05/28/16 2302 05/28/16 2312 05/29/16 0442  NA 139 140 138  K 3.1* 3.2* 3.4*  CL 102 101 102  CO2 23  --  25  GLUCOSE 192* 192* 199*  BUN '9 7 11  '$ CREATININE 0.92 0.80 0.98  CALCIUM 9.3  --  9.1   GFR: Estimated Creatinine Clearance: 35.2 mL/min (by C-G formula based on Cr of 0.98). Liver Function Tests:  Recent Labs Lab 05/28/16 2302  AST 31  ALT 18  ALKPHOS 93  BILITOT 0.4  PROT 7.4  ALBUMIN 4.0   No results for input(s): LIPASE, AMYLASE in the last 168 hours. No results for  input(s): AMMONIA in the last 168 hours. Coagulation Profile: No results for input(s): INR, PROTIME in the last 168 hours. Cardiac Enzymes:  Recent Labs Lab 05/28/16 2302  TROPONINI <0.03   BNP (last 3 results) No results for input(s): PROBNP in the last 8760 hours. HbA1C: No results for input(s): HGBA1C in the last 72 hours. CBG: No results for input(s): GLUCAP in the last 168 hours. Lipid Profile: No results for input(s): CHOL, HDL, LDLCALC, TRIG, CHOLHDL, LDLDIRECT in the last 72 hours. Thyroid Function Tests:  Recent Labs  05/29/16 0445  TSH 0.599  FREET4 0.83   Anemia Panel: No results for input(s): VITAMINB12, FOLATE, FERRITIN, TIBC, IRON, RETICCTPCT in the last 72 hours. Urine analysis:    Component Value Date/Time   COLORURINE AMBER* 08/22/2014 1105   APPEARANCEUR CLEAR 08/22/2014 1105   LABSPEC 1.011 08/22/2014 1105   PHURINE 6.0 08/22/2014 1105   GLUCOSEU NEGATIVE 08/22/2014 1105   HGBUR NEGATIVE 08/22/2014 1105   HGBUR trace-lysed 11/15/2010 1046   BILIRUBINUR NEGATIVE 08/22/2014 1105   KETONESUR NEGATIVE 08/22/2014 1105   PROTEINUR NEGATIVE 08/22/2014 1105   UROBILINOGEN 0.2 08/22/2014 1105   NITRITE NEGATIVE 08/22/2014 1105   LEUKOCYTESUR NEGATIVE 08/22/2014 1105    )No results found for this or any previous visit (from the past 240 hour(s)).    Anti-infectives    Start     Dose/Rate Route Frequency Ordered Stop   05/29/16 1000  azithromycin (ZITHROMAX) tablet 250 mg     250 mg Oral Daily 05/29/16 0136 06/02/16 0959   05/28/16 1700  azithromycin (ZITHROMAX) tablet 500 mg     500 mg Oral  Once 05/28/16 1652 05/28/16 1701       Radiology Studies: Dg Chest 2 View  05/28/2016  CLINICAL DATA:  Short of breath with productive cough for 1 week. History of emphysema and hypertension. EXAM: CHEST  2 VIEW COMPARISON:  11/17/2015 FINDINGS: Cardiac silhouette is normal in size. No mediastinal or hilar masses or evidence of adenopathy. Lungs are  hyperexpanded but clear. No pleural effusion or pneumothorax. Bony thorax is intact. IMPRESSION: No acute cardiopulmonary disease. Hyperexpanded lungs consistent with COPD. Electronically Signed   By: Lajean Manes M.D.   On: 05/28/2016 17:53   Ct Pelvis Wo Contrast  05/28/2016  CLINICAL DATA:  Patient's chair slipped out from under her. C/o tailbone pain. Eval for fx. EXAM: CT PELVIS WITHOUT CONTRAST TECHNIQUE: Multidetector CT imaging of the pelvis was performed following the standard protocol without intravenous contrast. COMPARISON:  CT abdomen pelvis, 11/17/2015 FINDINGS: No fracture.  No bone lesion. Hip joints are normally spaced and aligned. There is subchondral cystic change along the superior right acetabulum. No other  arthropathic change. No hip joint effusions. SI joints and symphysis pubis are normally spaced and aligned. Uterus is surgically absent. No pelvic masses or abnormal fluid collections. There is diverticulosis along the visualize left colon. No diverticulitis. No masses or adenopathy. Atherosclerotic calcification noted along the lower aorta and the iliac vessels. Surrounding soft tissues are unremarkable. IMPRESSION: 1. No fracture or acute bony abnormality.  No dislocation. 2. Left colon diverticulosis. 3. Aortic and iliac vessel atherosclerosis. Electronically Signed   By: Lajean Manes M.D.   On: 05/28/2016 17:57        Scheduled Meds: . azithromycin  250 mg Oral Daily  . diltiazem  240 mg Oral Daily  . docusate sodium  100 mg Oral BID  . enoxaparin (LOVENOX) injection  40 mg Subcutaneous Q24H  . feeding supplement (ENSURE ENLIVE)  237 mL Oral BID BM  . ipratropium-albuterol  3 mL Nebulization TID  . levothyroxine  25 mcg Oral QAC breakfast  . methylPREDNISolone (SOLU-MEDROL) injection  60 mg Intravenous Q12H  . pantoprazole  40 mg Oral Daily  . sertraline  25 mg Oral Daily  . sodium chloride flush  3 mL Intravenous Q12H   Continuous Infusions: . sodium chloride 50  mL/hr at 05/30/16 1149     LOS: 1 day    Time spent: 25 min    Bonnell Public, DO [facility] Pager [phone]  If 7PM-7AM, please contact night-coverage [email or web address] Password [facility] 05/30/2016, 3:52 PM

## 2016-05-30 NOTE — Progress Notes (Signed)
PT Cancellation Note  Patient Details Name: Heather [name] MRN: [id number] DOB: 03-Aug-1935   Cancelled Treatment:    Reason Eval/Treat Not Completed: Fatigue/lethargy limiting ability to participate (patient declined pleasantly. stated she just go stuck 3 times for an IV. will check back later as schedule allows,.)   Claretha Cooper 05/30/2016, 11:14 AM Tresa Endo PT [phone]

## 2016-05-30 NOTE — Evaluation (Signed)
Physical Therapy Evaluation Patient Details Name: Heather [name] MRN: [id number] DOB: [birth year] Today's Date: 05/30/2016   History of Present Illness  Heather [name] is a 81 y.o. female with medical history significant of COPD ,IBS; chronic pain; and multiple other medical problems . She reports that she has been in bed since Sunday with cough, using inhalers as instructed but symptoms kept getting worse. Patient had a slip and fall from a  chair slipped out from underneath her.admitted for COPD exacerbation.  Clinical Impression  The patient required much extra time to mobilize.  C/o pain of the tailbone. She adamantly declines SNF-"They nearly killed me there.".   Pt admitted with above diagnosis. Pt currently with functional limitations due to the deficits listed below (see PT Problem List). Pt will benefit from skilled PT to increase their independence and safety with mobility to allow discharge to the venue listed below.       Follow Up Recommendations SNF;Supervision/Assistance - 24 hour;Home health PT (patient states that she will not go to a snf therefore,  HHPT.)    Equipment Recommendations  None recommended by PT    Recommendations for Other Services       Precautions / Restrictions Precautions Precautions: Fall Precaution Comments: ck sats      Mobility  Bed Mobility Overal bed mobility: Needs Assistance Bed Mobility: Supine to Sit;Sit to Supine     Supine to sit: Min assist Sit to supine: Min assist   General bed mobility comments: assist with trunk and legs, moves slowly for pain  Transfers Overall transfer level: Needs assistance Equipment used: Rolling walker (2 wheeled) Transfers: Sit to/from Stand Sit to Stand: Min assist         General transfer comment: from bed and toilet  Ambulation/Gait Ambulation/Gait assistance: Min guard Ambulation Distance (Feet): 10 Feet (x2) Assistive device: Rolling walker (2 wheeled) Gait  Pattern/deviations: Step-through pattern     General Gait Details: slow, cues for safety, sats 92 % on RA for 15 minutes worth of activity  Stairs            Wheelchair Mobility    Modified Rankin (Stroke Patients Only)       Balance Overall balance assessment: History of Falls;Needs assistance Sitting-balance support: Feet supported;No upper extremity supported Sitting balance-Leahy Scale: Fair     Standing balance support: During functional activity;Single extremity supported Standing balance-Leahy Scale: Fair                               Pertinent Vitals/Pain Pain Score: 7  Pain Location: tail bone Pain Descriptors / Indicators: Cramping;Discomfort;Grimacing;Guarding Pain Intervention(s): Repositioned;Monitored during session;Limited activity within patient's tolerance    Home Living Family/patient expects to be discharged to:: Private residence Living Arrangements: Alone Available Help at Discharge: Available PRN/intermittently;Family Type of Home: House Home Access: Stairs to enter Entrance Stairs-Rails: Psychiatric nurse of Steps: 3 Home Layout: One level Home Equipment: Environmental consultant - 2 wheels;Cane - quad;Bedside commode;Shower seat;Hand held shower head Additional Comments: walk in shower, hand held shower    Prior Function Level of Independence: Independent with assistive device(s);Needs assistance         Comments: son cleans and buys groceries     Hand Dominance        Extremity/Trunk Assessment   Upper Extremity Assessment: Generalized weakness           Lower Extremity Assessment: Generalized weakness      Cervical /  Trunk Assessment: Normal  Communication   Communication: No difficulties  Cognition Arousal/Alertness: Awake/alert Behavior During Therapy: WFL for tasks assessed/performed Overall Cognitive Status: Within Functional Limits for tasks assessed                      General  Comments      Exercises        Assessment/Plan    PT Assessment Patient needs continued PT services  PT Diagnosis Difficulty walking;Acute pain;Generalized weakness   PT Problem List Decreased strength;Decreased activity tolerance;Decreased mobility;Decreased knowledge of precautions;Decreased knowledge of use of DME;Decreased safety awareness  PT Treatment Interventions Gait training;Functional mobility training;Therapeutic activities;Patient/family education   PT Goals (Current goals can be found in the Care Plan section) Acute Rehab PT Goals Patient Stated Goal: to get better PT Goal Formulation: With patient Time For Goal Achievement: 06/13/16 Potential to Achieve Goals: Good    Frequency Min 3X/week   Barriers to discharge Decreased caregiver support son works    Co-evaluation               End of Session   Activity Tolerance: Patient limited by fatigue;Patient limited by pain Patient left: in bed;with call bell/phone within reach Nurse Communication: Mobility status         Time: 1412-1450 PT Time Calculation (min) (ACUTE ONLY): 38 min   Charges:   PT Evaluation $PT Eval Low Complexity: 1 Procedure PT Treatments $Gait Training: 8-22 mins $Self Care/Home Management: 8-22   PT G Codes:        Claretha Cooper 05/30/2016, 3:03 PM Tresa Endo PT [phone]

## 2016-05-31 DIAGNOSIS — I10 Essential (primary) hypertension: Secondary | ICD-10-CM

## 2016-05-31 DIAGNOSIS — J441 Chronic obstructive pulmonary disease with (acute) exacerbation: Principal | ICD-10-CM

## 2016-05-31 DIAGNOSIS — E039 Hypothyroidism, unspecified: Secondary | ICD-10-CM

## 2016-05-31 LAB — BASIC METABOLIC PANEL
Anion gap: 8 (ref 5–15)
BUN: 17 mg/dL (ref 6–20)
CO2: 28 mmol/L (ref 22–32)
Calcium: 9.3 mg/dL (ref 8.9–10.3)
Chloride: 105 mmol/L (ref 101–111)
Creatinine, Ser: 0.71 mg/dL (ref 0.44–1.00)
GFR calc Af Amer: >60 mL/min (ref >60)
GFR calc non Af Amer: >60 mL/min (ref >60)
Glucose, Bld: 139 mg/dL — ABNORMAL HIGH (ref 65–99)
Potassium: 3.4 mmol/L — ABNORMAL LOW (ref 3.5–5.1)
Sodium: 141 mmol/L (ref 135–145)

## 2016-05-31 MED ORDER — BENZONATATE 100 MG PO CAPS
100.0000 mg | ORAL_CAPSULE | Freq: Two times a day (BID) | ORAL | Status: DC | PRN
  Administered 2016-05-31 – 2016-06-01 (×3): 100 mg via ORAL
  Filled 2016-05-31 (×3): qty 1

## 2016-05-31 MED ORDER — POTASSIUM CHLORIDE CRYS ER 20 MEQ PO TBCR
40.0000 meq | EXTENDED_RELEASE_TABLET | Freq: Once | ORAL | Status: AC
  Administered 2016-05-31: 40 meq via ORAL
  Filled 2016-05-31: qty 2

## 2016-05-31 MED ORDER — PREDNISONE 10 MG PO TABS
60.0000 mg | ORAL_TABLET | Freq: Every day | ORAL | Status: DC
  Administered 2016-05-31 – 2016-06-01 (×2): 60 mg via ORAL
  Filled 2016-05-31 (×3): qty 1

## 2016-05-31 MED ORDER — POLYETHYLENE GLYCOL 3350 17 G PO PACK
17.0000 g | PACK | Freq: Two times a day (BID) | ORAL | Status: DC
  Filled 2016-05-31 (×2): qty 1

## 2016-05-31 MED ORDER — WITCH HAZEL-GLYCERIN EX PADS
MEDICATED_PAD | CUTANEOUS | Status: DC | PRN
  Filled 2016-05-31: qty 100

## 2016-05-31 NOTE — Clinical Social Work Note (Signed)
CSW attempted to assess patient to confirm her refusal for SNF placement. Patient care currently in progress. CSW will try to check in with patient at a later time.    Liz Beach MSW, [place], [place], [phone]

## 2016-05-31 NOTE — Clinical Social Work Note (Signed)
Clinical Social Work Assessment  Patient Details  Name: MERIT GADSBY MRN: [id number] Date of Birth: 04/08/1935  Date of referral:  05/31/16               Reason for consult:  Discharge Planning                Permission sought to share information with:    Permission granted to share information::  No  Name::     [name]  Agency::     Relationship::  Son  Contact Information:     Housing/Transportation Living arrangements for the past 2 months:  Single Family Home Source of Information:  Patient Patient Interpreter Needed:  None Criminal Activity/Legal Involvement Pertinent to Current Situation/Hospitalization:  No - Comment as needed Significant Relationships:  Adult Children Lives with:  Self Do you feel safe going back to the place where you live?  Yes Need for family participation in patient care:  Yes (Comment)  Care giving concerns:  The patient states that she has no concerns about returning home at time of discharge. She requests [facility] services. She feels she will need an Therapist, sports, aide, and PT.   Social Worker assessment / plan:  CSW met with patient at bedside to complete assessment. The patient confirms that she would not be agreeable to placement in any kind of facility. She states that she will be returning home, despite understanding that PT's recommendation is for the patient to go to SNF. The patient states that she lives alone, but does have a supportive son and that "checks on her often." She states she doesn't have anyone that will be with her 24/7 but she remains adamant about going home. The patient will likely need ambulance transport home at time of discharge, unless the son can get off of work. Please contact case management if the patient requires ambulance transport.   Employment status:  Retired Forensic scientist:  [employer] PT Recommendations:  [place], [place], Home with [place] / Referral to  community resources:  Other (Comment Required) (Patient refuses placement)  Patient/Family's Response to care:  The patient appears happy with the care she has received. She is very appreciative of CSW's visit.  Patient/Family's Understanding of and Emotional Response to Diagnosis, Current Treatment, and Prognosis:  The patient seems to have a good understanding of the reason for her admission and her care plan. The patient does not seem realistic about her abilities to safely care for herself at home alone, but she remains adamant about going home.   Emotional Assessment Appearance:  Appears stated age Attitude/Demeanor/Rapport:  Other (The patient is appropriate and welcoming of CSW.) Affect (typically observed):  Accepting, Appropriate, Calm, Pleasant Orientation:  Oriented to Self, Oriented to Place, Oriented to  Time, Oriented to Situation Alcohol / Substance use:  Not Applicable Psych involvement (Current and /or in the community):  No (Comment)  Discharge Needs  Concerns to be addressed:  Discharge Planning Concerns Readmission within the last 30 days:  No Current discharge risk:  Chronically ill, Lives alone, Physical Impairment Barriers to Discharge:  Continued Medical Work up   Fredderick Phenix B, LCSW 05/31/2016, 1:13 PM

## 2016-05-31 NOTE — Progress Notes (Signed)
[facility] PROGRESS NOTE  Heather [name] ZOX:[id number] DOB: [birth year] DOA: 05/28/2016  PCP: [name], MD  Brief HPI: 81 year old Caucasian female with a past medical history of COPD, irritable bowel syndrome, chronic pain, presented with cough, shortness of breath. She was apparently wheezing. She was hospitalized for further management of COPD exacerbation.  Past medical history:  Past Medical History  Diagnosis Date  . ANXIETY 07/23/2009  . DEPRESSION 07/23/2009  . DIVERTICULOSIS, COLON 07/23/2009  . GERD 07/23/2009  . HIP PAIN, LEFT 05/16/2010  . HYPERLIPIDEMIA 07/23/2009  . HYPERTENSION 07/23/2009  . HYPOTHYROIDISM 07/23/2009  . LOW BACK PAIN, CHRONIC 10/01/2009  . PARESTHESIA 10/01/2009  . IBS (irritable bowel syndrome)   . History of Crohn's disease   . Chronic pain   . Insomnia   . Headache(784.0) 07/23/2009    occasional  . Shingles 2006    back  . Hemorrhoids   . Complication of anesthesia 7 or 8 yrs ago    woke up during colonscopy  . Atherosclerosis of aorta ([place]) 07/24/2015  . Coronary atherosclerosis 07/24/2015    Consultants: None  Procedures: None  Antibiotics: Azithromycin  Subjective: Patient states that she is feeling better, however, continues to have wheezing and cough. Denies any chest pain. No nausea, vomiting.  Objective:  Vital Signs  Filed Vitals:   05/30/16 2006 05/30/16 2106 05/31/16 0513 05/31/16 0816  BP:  148/65 158/77   Pulse:  93 79   Temp:  98.3 F (36.8 C) 98.4 F (36.9 C)   TempSrc:  Oral Oral   Resp:  22 20   Height:      Weight:      SpO2: 94% 95% 95% 94%    Intake/Output Summary (Last 24 hours) at 05/31/16 1302 Last data filed at 05/31/16 1136  Gross per 24 hour  Intake    100 ml  Output    200 ml  Net   -100 ml   Filed Weights   05/29/16 0116  Weight: 53 kg (116 lb 13.5 oz)    General appearance: alert, cooperative, appears stated age and no distress Resp: Good air entry  bilaterally. Few scattered wheezes. No crackles. Cardio: regular rate and rhythm, S1, S2 normal, no murmur, click, rub or gallop GI: soft, non-tender; bowel sounds normal; no masses,  no organomegaly Extremities: extremities normal, atraumatic, no cyanosis or edema Neurologic: Awake and alert. Oriented 3. No focal neurological deficits.  Lab Results:  Data Reviewed: I have personally reviewed following labs and imaging studies  CBC:  Recent Labs Lab 05/28/16 2302 05/28/16 2312 05/29/16 0442  WBC 13.2*  --  9.1  NEUTROABS 12.5*  --   --   HGB 12.4 13.3 11.6*  HCT 36.8 39.0 35.9*  MCV 86.2  --  88.4  PLT 378  --  811   Basic Metabolic Panel:  Recent Labs Lab 05/28/16 2302 05/28/16 2312 05/29/16 0442 05/31/16 0838  NA 139 140 138 141  K 3.1* 3.2* 3.4* 3.4*  CL 102 101 102 105  CO2 23  --  25 28  GLUCOSE 192* 192* 199* 139*  BUN '9 7 11 17  '$ CREATININE 0.92 0.80 0.98 0.71  CALCIUM 9.3  --  9.1 9.3   GFR: Estimated Creatinine Clearance: 43.1 mL/min (by C-G formula based on Cr of 0.71). Liver Function Tests:  Recent Labs Lab 05/28/16 2302  AST 31  ALT 18  ALKPHOS 93  BILITOT 0.4  PROT 7.4  ALBUMIN 4.0   Cardiac Enzymes:  Recent Labs Lab 05/28/16 2302  TROPONINI <0.03   Thyroid Function Tests:  Recent Labs  05/29/16 0445  TSH 0.599  FREET4 0.83   Urine analysis:    Component Value Date/Time   COLORURINE AMBER* 08/22/2014 1105   APPEARANCEUR CLEAR 08/22/2014 1105   LABSPEC 1.011 08/22/2014 1105   PHURINE 6.0 08/22/2014 1105   GLUCOSEU NEGATIVE 08/22/2014 1105   HGBUR NEGATIVE 08/22/2014 1105   HGBUR trace-lysed 11/15/2010 1046   BILIRUBINUR NEGATIVE 08/22/2014 1105   KETONESUR NEGATIVE 08/22/2014 1105   PROTEINUR NEGATIVE 08/22/2014 1105   UROBILINOGEN 0.2 08/22/2014 1105   NITRITE NEGATIVE 08/22/2014 1105   LEUKOCYTESUR NEGATIVE 08/22/2014 1105     Radiology Studies: No results found.   Medications:  Scheduled: . azithromycin  250  mg Oral Daily  . diltiazem  240 mg Oral Daily  . docusate sodium  100 mg Oral BID  . enoxaparin (LOVENOX) injection  40 mg Subcutaneous Q24H  . feeding supplement (ENSURE ENLIVE)  237 mL Oral BID BM  . ipratropium-albuterol  3 mL Nebulization TID  . levothyroxine  25 mcg Oral QAC breakfast  . pantoprazole  40 mg Oral Daily  . polyethylene glycol  17 g Oral BID  . predniSONE  60 mg Oral Q breakfast  . sertraline  25 mg Oral Daily  . sodium chloride flush  3 mL Intravenous Q12H   Continuous: . sodium chloride 10 mL/hr at 05/31/16 1012   RTM:YTRZNBVAPOLID, albuterol, benzonatate, bisacodyl, bisacodyl, clorazepate, oxyCODONE, promethazine, senna-docusate, traZODone, witch hazel-glycerin  Assessment/Plan:  Principal Problem:   COPD exacerbation ([place]) Active Problems:   Hypothyroidism   Anxiety state   Essential hypertension   GERD   HIP PAIN, LEFT   Dysphagia   Hypoxia     Acute COPD Exacerbation Patient was started on nebulizer treatments, steroids and antibiotics. She has been slow to improve but seems to be getting better. Lungs show improved air entry bilaterally. Continue current management for now. Change to oral steroids. Continue to mobilize. She has been saturating normally on room air. Does not use oxygen at home.  Hypothyroidism Continue Synthroid.TSH and free T4 were normal.  History of anxiety disorder  Continue Sertraline, Tranxene, Trazodone.  Essential hypertension  Continue Cardizem   GERD, dysphagia  PPI. Patient with c/o persistent dysphagia that has not apparently been evaluated. This can be pursued further as outpatient. She seems to be tolerating her diet without any problems.  Hip pain CT negative for fracture following fall but now hip pain is worse and she also has sacral pain.Seen by physical therapy. Skilled nursing facility was recommended. However, patient declines.  DVT Prophylaxis: Lovenox    Code Status: Full code  Family  Communication: Discussed with the patient  Disposition Plan: Mobilize. Anticipate discharge tomorrow with home health. Patient declined skilled nursing facility placement.    LOS: 2 days   [place] [facility] Pager [phone] 05/31/2016, 1:02 PM  If 7PM-7AM, please contact night-coverage at [email or web address], password [facility]

## 2016-06-01 MED ORDER — POLYETHYLENE GLYCOL 3350 17 G PO PACK: 17.0000 g | PACK | Freq: Two times a day (BID) | ORAL | Status: DC

## 2016-06-01 MED ORDER — PREDNISONE 20 MG PO TABS: ORAL_TABLET | ORAL | Status: DC

## 2016-06-01 MED ORDER — PROMETHAZINE HCL 25 MG PO TABS: ORAL_TABLET | ORAL | Status: DC

## 2016-06-01 MED ORDER — AZITHROMYCIN 250 MG PO TABS: 250.0000 mg | ORAL_TABLET | Freq: Every day | ORAL | Status: DC

## 2016-06-01 MED ORDER — ALBUTEROL SULFATE (2.5 MG/3ML) 0.083% IN NEBU
2.5000 mg | INHALATION_SOLUTION | Freq: Four times a day (QID) | RESPIRATORY_TRACT | Status: DC | PRN
Start: 2016-06-01 — End: 2016-09-22

## 2016-06-01 MED ORDER — DOCUSATE SODIUM 100 MG PO CAPS: 100.0000 mg | ORAL_CAPSULE | Freq: Two times a day (BID) | ORAL | Status: DC

## 2016-06-01 NOTE — Progress Notes (Signed)
SATURATION QUALIFICATIONS: (This note is used to comply with regulatory documentation for home oxygen)  Patient Saturations on Room Air at Rest = 94%  Patient Saturations on Room Air while Ambulating = 87%  Patient Saturations on 2 Liters of oxygen while Ambulating = 94%  Please briefly explain why patient needs home oxygen: COPD

## 2016-06-01 NOTE — Care Management Note (Addendum)
Case Management Note  Patient Details  Name: Heather [name] MRN: [id number] Date of Birth: 1935/01/02  Subjective/Objective:   COPD exacerbation.                 Action/Plan: Discharge Planning: AVS reviewed: NCM spoke to pt and offered choice for [facility]. States she had [employer] in the past and would like to use them for [facility]. Pt states she does not have nebulizer machine at home. Contacted [facility] DME rep for delivery of nebulizer to room prior to dc. Pt states he son, Anastasio Champion # [phone] does visit her daily and will assist as needed. Pt has declined SNF-rehab. Unit RN ambulated pt in hall without oxygen. Unit RN contacted attending. Oxygen for home not ordered.  Will reevaluate prior to dc.   [place] with new referral.   [facility] for oxygen for home. Spoke to [place], [employer] rep, will deliver portable to room and concentrator to her home once she arrives.   PCP- [name] MD   Expected Discharge Date:  06/01/2016              Expected Discharge Plan:  [place]  In-House Referral:  NA, Clinical Social Work  Discharge planning Services  CM Consult  Post Acute Care Choice:  Home Health Choice offered to:  Patient  DME Arranged:  Nebulizer/meds DME Agency:  [place]:  RN, PT, Nurse's Aide [place] Agency:  [facility]  Status of Service:  Completed, signed off  If discussed at [place] of Stay Meetings, dates discussed:    Additional Comments:  Erenest Rasher, RN 06/01/2016, 10:07 AM

## 2016-06-01 NOTE — Progress Notes (Signed)
Patient discharged.  Educated on discharge instructions, follow-up appointments, and discharge medications.  Son, Remo Lipps, in room and assists with medications.  Educated patient and son on use of nebulizer at home.  Son demonstrated understanding.  Prescriptions given to patient to bring to pharmacy.  Belongings gathered by son and patient.  Oxygen delivered to room and patient left with nebulizer machine and portable oxygen tank.  No questions or concerns at this time.  AVS signed.  Patient escorted to son's car via wheelchair with NT.

## 2016-06-01 NOTE — Discharge Summary (Addendum)
[facility]  Physician Discharge Summary   Patient ID: Heather [name] MRN: [id number] DOB/AGE: 1935-06-03 81 y.o.  Admit date: 05/28/2016 Discharge date: 06/01/2016  PCP: [name], MD  DISCHARGE DIAGNOSES:  Principal Problem:   COPD exacerbation ([place]) Active Problems:   Hypothyroidism   Anxiety state   Essential hypertension   GERD   HIP PAIN, LEFT   Dysphagia   Hypoxia   RECOMMENDATIONS FOR OUTPATIENT FOLLOW UP: 1. Patient declined skilled nursing facility placement. Home health has been ordered   DISCHARGE CONDITION: fair  Diet recommendation: As before  [facility] Weights   05/29/16 0116  Weight: 53 kg (116 lb 13.5 oz)    INITIAL HISTORY: 81 year old Caucasian female with a past medical history of COPD, irritable bowel syndrome, chronic pain, presented with cough, shortness of breath. She was apparently wheezing. She was hospitalized for further management of COPD exacerbation.   HOSPITAL COURSE:   Acute COPD Exacerbation Patient was started on nebulizer treatments, steroids and antibiotics. She was slow to improve but seems to be getting better. Lungs show improved air entry bilaterally. She will be discharged on antibiotics and a steroid taper. She was ambulated and her sats for the most part remained in the early 29s.   Hypothyroidism Continue Synthroid.TSH and free T4 were normal.  History of anxiety disorder  Continue Sertraline, Tranxene, Trazodone.  Essential hypertension  Continue Cardizem   GERD, dysphagia  Continue PPI. Patient with c/o persistent dysphagia that has not apparently been evaluated. This can be pursued further as outpatient. She seems to be tolerating her diet without any problems.  Hip pain CT negative for fracture following fall but now hip pain is worse and she also has sacral pain.Seen by physical therapy. Skilled nursing facility was recommended. However, patient declines. Home health will be  ordered.  Overall improved. Patient is stable for discharge today.  For unclear reasons patient was ambulated again and her sats dropped to 87%. Home o2 will be ordered.  PERTINENT LABS:  The results of significant diagnostics from this hospitalization (including imaging, microbiology, ancillary and laboratory) are listed below for reference.     Labs: Basic Metabolic Panel:  Recent Labs Lab 05/28/16 2302 05/28/16 2312 05/29/16 0442 05/31/16 0838  NA 139 140 138 141  K 3.1* 3.2* 3.4* 3.4*  CL 102 101 102 105  CO2 23  --  25 28  GLUCOSE 192* 192* 199* 139*  BUN '9 7 11 17  '$ CREATININE 0.92 0.80 0.98 0.71  CALCIUM 9.3  --  9.1 9.3   Liver Function Tests:  Recent Labs Lab 05/28/16 2302  AST 31  ALT 18  ALKPHOS 93  BILITOT 0.4  PROT 7.4  ALBUMIN 4.0   CBC:  Recent Labs Lab 05/28/16 2302 05/28/16 2312 05/29/16 0442  WBC 13.2*  --  9.1  NEUTROABS 12.5*  --   --   HGB 12.4 13.3 11.6*  HCT 36.8 39.0 35.9*  MCV 86.2  --  88.4  PLT 378  --  354   Cardiac Enzymes:  Recent Labs Lab 05/28/16 2302  TROPONINI <0.03   BNP: BNP (last 3 results)  Recent Labs  07/23/15 1042 05/28/16 2302  BNP 24.3 45.2     IMAGING STUDIES Dg Chest 2 View  05/28/2016  CLINICAL DATA:  Short of breath with productive cough for 1 week. History of emphysema and hypertension. EXAM: CHEST  2 VIEW COMPARISON:  11/17/2015 FINDINGS: Cardiac silhouette is normal in size. No mediastinal or hilar masses or evidence  of adenopathy. Lungs are hyperexpanded but clear. No pleural effusion or pneumothorax. Bony thorax is intact. IMPRESSION: No acute cardiopulmonary disease. Hyperexpanded lungs consistent with COPD. Electronically Signed   By: Lajean Manes M.D.   On: 05/28/2016 17:53   Ct Pelvis Wo Contrast  05/28/2016  CLINICAL DATA:  Patient's chair slipped out from under her. C/o tailbone pain. Eval for fx. EXAM: CT PELVIS WITHOUT CONTRAST TECHNIQUE: Multidetector CT imaging of the pelvis was  performed following the standard protocol without intravenous contrast. COMPARISON:  CT abdomen pelvis, 11/17/2015 FINDINGS: No fracture.  No bone lesion. Hip joints are normally spaced and aligned. There is subchondral cystic change along the superior right acetabulum. No other arthropathic change. No hip joint effusions. SI joints and symphysis pubis are normally spaced and aligned. Uterus is surgically absent. No pelvic masses or abnormal fluid collections. There is diverticulosis along the visualize left colon. No diverticulitis. No masses or adenopathy. Atherosclerotic calcification noted along the lower aorta and the iliac vessels. Surrounding soft tissues are unremarkable. IMPRESSION: 1. No fracture or acute bony abnormality.  No dislocation. 2. Left colon diverticulosis. 3. Aortic and iliac vessel atherosclerosis. Electronically Signed   By: Lajean Manes M.D.   On: 05/28/2016 17:57    DISCHARGE EXAMINATION: Filed Vitals:   05/31/16 2242 06/01/16 0436 06/01/16 5427 06/01/16 0748  BP: 155/64  160/62   Pulse: 81  82   Temp: 97.6 F (36.4 C)  98.2 F (36.8 C)   TempSrc: Oral  Oral   Resp: 16  16   Height:      Weight:      SpO2: 98% 93% 93% 98%   General appearance: alert, cooperative, appears stated age and no distress Resp: Much improved air entry. No wheezing. Cardio: regular rate and rhythm, S1, S2 normal, no murmur, click, rub or gallop GI: soft, non-tender; bowel sounds normal; no masses,  no organomegaly Extremities: extremities normal, atraumatic, no cyanosis or edema  DISPOSITION: Home with home health  Discharge Instructions    Call MD for:  difficulty breathing, headache or visual disturbances    Complete by:  As directed      Call MD for:  extreme fatigue    Complete by:  As directed      Call MD for:  persistant dizziness or light-headedness    Complete by:  As directed      Call MD for:  persistant nausea and vomiting    Complete by:  As directed      Call MD for:   severe uncontrolled pain    Complete by:  As directed      Call MD for:  temperature >100.4    Complete by:  As directed      Diet - low sodium heart healthy    Complete by:  As directed      Discharge instructions    Complete by:  As directed   Please follow up with your PCP within next few days.  You were cared for by a hospitalist during your hospital stay. If you have any questions about your discharge medications or the care you received while you were in the hospital after you are discharged, you can call the unit and asked to speak with the hospitalist on call if the hospitalist that took care of you is not available. Once you are discharged, your primary care physician will handle any further medical issues. Please note that NO REFILLS for any discharge medications will be authorized once you  are discharged, as it is imperative that you return to your primary care physician (or establish a relationship with a primary care physician if you do not have one) for your aftercare needs so that they can reassess your need for medications and monitor your lab values. If you do not have a primary care physician, you can call [phone] for a physician referral.     Increase activity slowly    Complete by:  As directed            ALLERGIES:  Allergies  Allergen Reactions  . Tylenol [Acetaminophen] Other (See Comments)    Nightmares  . Symbicort [Budesonide-Formoterol Fumarate] Other (See Comments)    Pt felt like her tongue was swollen, could not swallow  . Duloxetine Other (See Comments)    Patient doesn't recall  . Metronidazole Other (See Comments)    Patient doesn't recall   . Rofecoxib Other (See Comments)    Patient doesn't recall      Current Discharge Medication List    START taking these medications   Details  albuterol (PROVENTIL) (2.5 MG/3ML) 0.083% nebulizer solution Take 3 mLs (2.5 mg total) by nebulization every 6 (six) hours as needed for wheezing or shortness of  breath. Qty: 240 mL, Refills: 1    azithromycin (ZITHROMAX) 250 MG tablet Take 1 tablet (250 mg total) by mouth daily. For 3 more days Qty: 3 each, Refills: 0    docusate sodium (COLACE) 100 MG capsule Take 1 capsule (100 mg total) by mouth 2 (two) times daily. Qty: 60 capsule, Refills: 0    polyethylene glycol (MIRALAX / GLYCOLAX) packet Take 17 g by mouth 2 (two) times daily. Qty: 60 each, Refills: 0    predniSONE (DELTASONE) 20 MG tablet Take 3 tablets once daily for 3 days, then take 2 tablets once daily for 3 days, then take 1 tablet once daily for 3 days and then STOP. Qty: 18 tablet, Refills: 0      CONTINUE these medications which have CHANGED   Details  promethazine (PHENERGAN) 25 MG tablet TAKE 1 TABLET EVERY 8 HOURS AS NEEDED FOR NAUSEA. Qty: 20 tablet, Refills: 0      CONTINUE these medications which have NOT CHANGED   Details  Albuterol Sulfate (PROAIR RESPICLICK) 841 (90 Base) MCG/ACT AEPB Inhale 2 puffs into the lungs every 6 (six) hours as needed. Qty: 1 each, Refills: 2    benzonatate (TESSALON) 100 MG capsule TAKE 1 CAPSULE TWICE DAILY AS NEEDED FOR COUGH. Qty: 20 capsule, Refills: 0    Cholecalciferol 2000 UNITS TABS Take 2,000 Units by mouth daily.    clorazepate (TRANXENE) 7.5 MG tablet Take 1 tablet (7.5 mg total) by mouth 2 (two) times daily as needed for anxiety. Qty: 180 tablet, Refills: 1    diltiazem (CARDIZEM CD) 240 MG 24 hr capsule TAKE (1) CAPSULE DAILY. Qty: 90 capsule, Refills: 0    feeding supplement, ENSURE ENLIVE, (ENSURE ENLIVE) LIQD Take 237 mLs by mouth 2 (two) times daily between meals. Qty: 237 mL, Refills: 12    levothyroxine (SYNTHROID, LEVOTHROID) 25 MCG tablet TAKE 1 TABLET DAILY BEFORE BREAKFAST. Qty: 90 tablet, Refills: 1    NEXIUM 40 MG capsule TAKE 1 CAPSULE DAILY BEFORE BREAKFAST. Qty: 90 capsule, Refills: 1    Polyethyl Glycol-Propyl Glycol (SYSTANE OP) Place 1 drop into both eyes 2 (two) times daily as needed (for dry  eyes).     sertraline (ZOLOFT) 25 MG tablet TAKE 1 TABLET EACH DAY. Qty: 90  tablet, Refills: 0    tiotropium (SPIRIVA HANDIHALER) 18 MCG inhalation capsule Place 1 capsule (18 mcg total) into inhaler and inhale daily. Qty: 30 capsule, Refills: 12    traMADol (ULTRAM) 50 MG tablet TAKE 1 TABLET EVERY SIX HOURS AS NEEDED FOR PAIN. Qty: 60 tablet, Refills: 5    traZODone (DESYREL) 50 MG tablet TAKE 1/2 TO 1 TABLET AT BEDTIME AS NEEDED FOR REST. Qty: 60 tablet, Refills: 2    zolpidem (AMBIEN) 5 MG tablet Take 1 tablet (5 mg total) by mouth at bedtime as needed. Qty: 30 tablet, Refills: 5    furosemide (LASIX) 20 MG tablet TAKE 1 TABLET DAILY IF NEEDED FOR SIGNIFICANT LOWER EXTREMITY SWELLING. Qty: 30 tablet, Refills: 2       Follow-up Information    Follow up with [name], MD. Schedule an appointment as soon as possible for a visit in 3 days.   Specialty:  Internal Medicine   Why:  post hospitalization follow up   Contact information:   [place] [address] [place] [address] [phone]       Follow up with [facility]   Why:  Home Health RN and Physical Therapy   Contact information:   [address] [address] [place] [address] [phone]       TOTAL DISCHARGE TIME: 34 minutes  [facility]  [facility] Pager [phone]  06/01/2016, 11:54 AM

## 2016-06-02 DIAGNOSIS — F418 Other specified anxiety disorders: Secondary | ICD-10-CM | POA: Diagnosis not present

## 2016-06-02 DIAGNOSIS — G8929 Other chronic pain: Secondary | ICD-10-CM | POA: Diagnosis not present

## 2016-06-02 DIAGNOSIS — I2510 Atherosclerotic heart disease of native coronary artery without angina pectoris: Secondary | ICD-10-CM | POA: Diagnosis not present

## 2016-06-02 DIAGNOSIS — Z9981 Dependence on supplemental oxygen: Secondary | ICD-10-CM | POA: Diagnosis not present

## 2016-06-02 DIAGNOSIS — I10 Essential (primary) hypertension: Secondary | ICD-10-CM

## 2016-06-02 DIAGNOSIS — J441 Chronic obstructive pulmonary disease with (acute) exacerbation: Secondary | ICD-10-CM | POA: Diagnosis not present

## 2016-06-03 ENCOUNTER — Telehealth: Payer: Self-pay | Admitting: General Practice

## 2016-06-03 NOTE — Telephone Encounter (Signed)
Transition Care Management Follow-up Telephone Call   Date discharged?06/01/2016   How have you been since you were released from the hospital? Feeling very weak.   Do you understand why you were in the hospital? yes   Do you understand the discharge instructions? yes   Where were you discharged to? Home with home health.   Items Reviewed:  Medications reviewed: yes  Allergies reviewed: yes  Dietary changes reviewed: yes  Referrals reviewed: yes   Functional Questionnaire:   Activities of Daily Living (ADLs):   She states they are independent in the following: ambulation, feeding, continence, grooming, toileting and dressing States they require assistance with the following:Patient says that she has to have help bathing and that home health will be coming to help with that.   Any transportation issues/concerns?: yes.  Patient wants to wait until next week to schedule appointment due to transportation issues.     Any patient concerns? no   Confirmed importance and date/time of follow-up visits scheduled I confirmed the importance of follow up appointment however patient wants to wait until next week to schedule because she has to check with her son who just started a new job.  I told patient I would call her later on in the week to get something on the schedule.  Provider Appointment booked with.  Not yet scheduled.  Confirmed with patient if condition begins to worsen call PCP or go to the ER.  Patient was given the office number and encouraged to call back with question or concerns.  : yes

## 2016-06-04 DIAGNOSIS — I2510 Atherosclerotic heart disease of native coronary artery without angina pectoris: Secondary | ICD-10-CM | POA: Diagnosis not present

## 2016-06-04 DIAGNOSIS — F418 Other specified anxiety disorders: Secondary | ICD-10-CM | POA: Diagnosis not present

## 2016-06-04 DIAGNOSIS — Z9981 Dependence on supplemental oxygen: Secondary | ICD-10-CM | POA: Diagnosis not present

## 2016-06-04 DIAGNOSIS — I10 Essential (primary) hypertension: Secondary | ICD-10-CM | POA: Diagnosis not present

## 2016-06-04 DIAGNOSIS — G8929 Other chronic pain: Secondary | ICD-10-CM | POA: Diagnosis not present

## 2016-06-04 DIAGNOSIS — J441 Chronic obstructive pulmonary disease with (acute) exacerbation: Secondary | ICD-10-CM | POA: Diagnosis not present

## 2016-06-05 DIAGNOSIS — J441 Chronic obstructive pulmonary disease with (acute) exacerbation: Secondary | ICD-10-CM | POA: Diagnosis not present

## 2016-06-05 DIAGNOSIS — F418 Other specified anxiety disorders: Secondary | ICD-10-CM | POA: Diagnosis not present

## 2016-06-05 DIAGNOSIS — G8929 Other chronic pain: Secondary | ICD-10-CM | POA: Diagnosis not present

## 2016-06-05 DIAGNOSIS — I10 Essential (primary) hypertension: Secondary | ICD-10-CM | POA: Diagnosis not present

## 2016-06-05 DIAGNOSIS — Z9981 Dependence on supplemental oxygen: Secondary | ICD-10-CM | POA: Diagnosis not present

## 2016-06-05 DIAGNOSIS — I2510 Atherosclerotic heart disease of native coronary artery without angina pectoris: Secondary | ICD-10-CM | POA: Diagnosis not present

## 2016-06-06 ENCOUNTER — Telehealth: Payer: Self-pay | Admitting: Internal Medicine

## 2016-06-06 NOTE — Telephone Encounter (Signed)
All okay 

## 2016-06-06 NOTE — Telephone Encounter (Signed)
Heather [name] with [facility] home [facility] needs verbal orders. Did assessment this week. Orders for 1 this week,  2 x /week for 8 weeks (for basic strength, balancing and endurance)

## 2016-06-09 DIAGNOSIS — J441 Chronic obstructive pulmonary disease with (acute) exacerbation: Secondary | ICD-10-CM | POA: Diagnosis not present

## 2016-06-09 DIAGNOSIS — G8929 Other chronic pain: Secondary | ICD-10-CM | POA: Diagnosis not present

## 2016-06-09 DIAGNOSIS — I10 Essential (primary) hypertension: Secondary | ICD-10-CM | POA: Diagnosis not present

## 2016-06-09 DIAGNOSIS — F418 Other specified anxiety disorders: Secondary | ICD-10-CM | POA: Diagnosis not present

## 2016-06-09 DIAGNOSIS — Z9981 Dependence on supplemental oxygen: Secondary | ICD-10-CM | POA: Diagnosis not present

## 2016-06-09 DIAGNOSIS — I2510 Atherosclerotic heart disease of native coronary artery without angina pectoris: Secondary | ICD-10-CM | POA: Diagnosis not present

## 2016-06-11 ENCOUNTER — Ambulatory Visit (INDEPENDENT_AMBULATORY_CARE_PROVIDER_SITE_OTHER): Payer: Medicare Other | Admitting: Internal Medicine

## 2016-06-11 ENCOUNTER — Encounter: Payer: Self-pay | Admitting: Internal Medicine

## 2016-06-11 VITALS — BP 136/70 | HR 87 | Temp 98.4°F | Resp 18 | Ht 59.0 in | Wt 110.0 lb

## 2016-06-11 DIAGNOSIS — F418 Other specified anxiety disorders: Secondary | ICD-10-CM | POA: Diagnosis not present

## 2016-06-11 DIAGNOSIS — F329 Major depressive disorder, single episode, unspecified: Secondary | ICD-10-CM

## 2016-06-11 DIAGNOSIS — Z9981 Dependence on supplemental oxygen: Secondary | ICD-10-CM

## 2016-06-11 DIAGNOSIS — E039 Hypothyroidism, unspecified: Secondary | ICD-10-CM

## 2016-06-11 DIAGNOSIS — J9601 Acute respiratory failure with hypoxia: Secondary | ICD-10-CM

## 2016-06-11 DIAGNOSIS — J441 Chronic obstructive pulmonary disease with (acute) exacerbation: Secondary | ICD-10-CM

## 2016-06-11 DIAGNOSIS — F419 Anxiety disorder, unspecified: Secondary | ICD-10-CM

## 2016-06-11 DIAGNOSIS — I2510 Atherosclerotic heart disease of native coronary artery without angina pectoris: Secondary | ICD-10-CM | POA: Diagnosis not present

## 2016-06-11 DIAGNOSIS — I10 Essential (primary) hypertension: Secondary | ICD-10-CM

## 2016-06-11 DIAGNOSIS — G8929 Other chronic pain: Secondary | ICD-10-CM | POA: Diagnosis not present

## 2016-06-11 NOTE — Patient Instructions (Signed)
Limit your sodium (Salt) intake  Continue home physical therapy  Discontinue oxygen therapy (done)    It is important that you exercise regularly, at least 10-20 minutes 3 to 4 times per week.  If you develop chest pain or shortness of breath seek  medical attention.  Return in 3 months for follow-up

## 2016-06-11 NOTE — Progress Notes (Signed)
Pre visit review using our clinic review tool, if applicable. No additional management support is needed unless otherwise documented below in the visit note. 

## 2016-06-11 NOTE — Telephone Encounter (Signed)
Spoke to [place], verbal orders given for Home Health Physical Therapy, 1 x this past week, 2 x's a week for 8 weeks for pt. Okay per Dr.K. Mechele Claude verbalized understanding.

## 2016-06-11 NOTE — Progress Notes (Signed)
Subjective:    Patient ID: Heather [name], female    DOB: November 18, 1935, 81 y.o.   MRN: [id number]  HPI  Admit date: 05/28/2016 Discharge date: 06/01/2016  DISCHARGE DIAGNOSES:  Principal Problem:  COPD exacerbation ([place]) Active Problems:  Hypothyroidism  Anxiety state  Essential hypertension  GERD  HIP PAIN, LEFT  Dysphagia  Hypoxia   RECOMMENDATIONS FOR OUTPATIENT FOLLOW UP: 1. Patient declined skilled nursing facility placement. Home health has been ordered   81 year old patient who is seen following a recent hospital discharge within the past 2 weeks and for  transitional care management.  She continues to receive home physical therapy.  She continues to have weakness, but seems to be improving.   Her pulmonary status has been remarkably stable.  She was discharged on home O2, which she has not required.  O2 saturation here today 99%.  She remains on maintenance bronchodilators and does have a home nebulizer treatment that she uses very infrequently.  Since her discharge she did call EMS.  On a single occasion due to shortness of breath.  She has considerable anxiety.   Prednisone has been tapered and discontinued .  Denies any cough, fever, or shortness of breath.   Her main complaint is constipation.  Hospital course complicated by fecal impaction that required digital disimpaction.  The patient remains on Colace as well as MiraLAX daily.  Medications were reviewed.  When necessary medications include albuterol, furosemide, and Ultram  Past Medical History  Diagnosis Date  . ANXIETY 07/23/2009  . DEPRESSION 07/23/2009  . DIVERTICULOSIS, COLON 07/23/2009  . GERD 07/23/2009  . HIP PAIN, LEFT 05/16/2010  . HYPERLIPIDEMIA 07/23/2009  . HYPERTENSION 07/23/2009  . HYPOTHYROIDISM 07/23/2009  . LOW BACK PAIN, CHRONIC 10/01/2009  . PARESTHESIA 10/01/2009  . IBS (irritable bowel syndrome)   . History of Crohn's disease   . Chronic pain   . Insomnia   . Headache(784.0)  07/23/2009    occasional  . Shingles 2006    back  . Hemorrhoids   . Complication of anesthesia 7 or 8 yrs ago    woke up during colonscopy  . Atherosclerosis of aorta ([place]) 07/24/2015  . Coronary atherosclerosis 07/24/2015     Social History   Social History  . Marital Status: Widowed    Spouse Name: N/A  . Number of Children: 1  . Years of Education: N/A   Occupational History  . Not on file.   Social History Main Topics  . Smoking status: Never Smoker   . Smokeless tobacco: Never Used     Comment: smoked rarely in youth  . Alcohol Use: No  . Drug Use: No  . Sexual Activity: Not on file   Other Topics Concern  . Not on file   Social History Narrative   Widowed.  Lives alone in her own home.  Ambulates with a cane/walker when needed.    Past Surgical History  Procedure Laterality Date  . Tonsillectomy  yrs ago  . Hemorrhoid surgery  yrs ago  . Appendectomy  yrs ago  . Bilateral salpingoophorectomy  age 89 or 76  . Abdominal hysterectomy  age 54 or 43  . Esophagogastroduodenoscopy N/A 11/17/2013    Procedure: ESOPHAGOGASTRODUODENOSCOPY (EGD);  Surgeon: Cleotis Nipper, MD;  Location: [facility];  Service: Endoscopy;  Laterality: N/A;  . Colonoscopy N/A 11/17/2013    Procedure: COLONOSCOPY;  Surgeon: Cleotis Nipper, MD;  Location: [facility];  Service: Endoscopy;  Laterality: N/A;    Family History  Problem Relation Age of Onset  . Clotting disorder Neg Hx   . COPD Sister   . Cancer Sister     Breast  . Cancer Mother     Unknown type    Allergies  Allergen Reactions  . Tylenol [Acetaminophen] Other (See Comments)    Nightmares  . Symbicort [Budesonide-Formoterol Fumarate] Other (See Comments)    Pt felt like her tongue was swollen, could not swallow  . Duloxetine Other (See Comments)    Patient doesn't recall  . Metronidazole Other (See Comments)    Patient doesn't recall   . Rofecoxib Other (See Comments)    Patient doesn't recall      Current Outpatient Prescriptions on File Prior to Visit  Medication Sig Dispense Refill  . albuterol (PROVENTIL) (2.5 MG/3ML) 0.083% nebulizer solution Take 3 mLs (2.5 mg total) by nebulization every 6 (six) hours as needed for wheezing or shortness of breath. 240 mL 1  . Cholecalciferol 2000 UNITS TABS Take 2,000 Units by mouth daily.    . clorazepate (TRANXENE) 7.5 MG tablet Take 1 tablet (7.5 mg total) by mouth 2 (two) times daily as needed for anxiety. 180 tablet 1  . diltiazem (CARDIZEM CD) 240 MG 24 hr capsule TAKE (1) CAPSULE DAILY. (Patient taking differently: TAKE (1) CAPSULE by mouth once DAILY.) 90 capsule 0  . docusate sodium (COLACE) 100 MG capsule Take 1 capsule (100 mg total) by mouth 2 (two) times daily. 60 capsule 0  . feeding supplement, ENSURE ENLIVE, (ENSURE ENLIVE) LIQD Take 237 mLs by mouth 2 (two) times daily between meals. (Patient taking differently: Take 237 mLs by mouth 2 (two) times daily as needed (poor appetite). ) 237 mL 12  . furosemide (LASIX) 20 MG tablet TAKE 1 TABLET DAILY IF NEEDED FOR SIGNIFICANT LOWER EXTREMITY SWELLING. 30 tablet 2  . levothyroxine (SYNTHROID, LEVOTHROID) 25 MCG tablet TAKE 1 TABLET DAILY BEFORE BREAKFAST. 90 tablet 1  . NEXIUM 40 MG capsule TAKE 1 CAPSULE DAILY BEFORE BREAKFAST. 90 capsule 1  . Polyethyl Glycol-Propyl Glycol (SYSTANE OP) Place 1 drop into both eyes 2 (two) times daily as needed (for dry eyes).     . polyethylene glycol (MIRALAX / GLYCOLAX) packet Take 17 g by mouth 2 (two) times daily. 60 each 0  . promethazine (PHENERGAN) 25 MG tablet TAKE 1 TABLET EVERY 8 HOURS AS NEEDED FOR NAUSEA. 20 tablet 0  . sertraline (ZOLOFT) 25 MG tablet TAKE 1 TABLET EACH DAY. (Patient taking differently: TAKE 1 TABLET by mouth once daily.) 90 tablet 0  . tiotropium (SPIRIVA HANDIHALER) 18 MCG inhalation capsule Place 1 capsule (18 mcg total) into inhaler and inhale daily. 30 capsule 12  . traMADol (ULTRAM) 50 MG tablet TAKE 1 TABLET EVERY  SIX HOURS AS NEEDED FOR PAIN. 60 tablet 5  . traZODone (DESYREL) 50 MG tablet TAKE 1/2 TO 1 TABLET AT BEDTIME AS NEEDED FOR REST. 60 tablet 2  . zolpidem (AMBIEN) 5 MG tablet Take 1 tablet (5 mg total) by mouth at bedtime as needed. (Patient taking differently: Take 5 mg by mouth at bedtime as needed for sleep. ) 30 tablet 5   No current facility-administered medications on file prior to visit.    BP 136/70 mmHg  Pulse 87  Temp(Src) 98.4 F (36.9 C) (Oral)  Resp 18  Ht '4\' 11"'$  (1.499 m)  Wt 110 lb (49.896 kg)  BMI 22.21 kg/m2  SpO2 99%     Review of Systems  Constitutional: Positive for activity change and  fatigue. Negative for appetite change.  HENT: Negative for congestion, dental problem, hearing loss, rhinorrhea, sinus pressure, sore throat and tinnitus.   Eyes: Negative for pain, discharge and visual disturbance.  Respiratory: Positive for wheezing. Negative for cough and shortness of breath.   Cardiovascular: Negative for chest pain, palpitations and leg swelling.  Gastrointestinal: Positive for constipation. Negative for nausea, vomiting, abdominal pain, diarrhea, blood in stool and abdominal distention.  Genitourinary: Negative for dysuria, urgency, frequency, hematuria, flank pain, vaginal bleeding, vaginal discharge, difficulty urinating, vaginal pain and pelvic pain.  Musculoskeletal: Negative for joint swelling, arthralgias and gait problem.  Skin: Negative for rash.  Neurological: Positive for weakness. Negative for dizziness, syncope, speech difficulty, numbness and headaches.  Hematological: Negative for adenopathy.  Psychiatric/Behavioral: Negative for behavioral problems, dysphoric mood and agitation. The patient is nervous/anxious.        Objective:   Physical Exam  Constitutional: She is oriented to person, place, and time. She appears well-developed and well-nourished.  Anxious No distress Blood pressure 130/70 O2 saturation 99%  Wt Readings from Last 3  Encounters: 06/11/16 : 110 lb (49.896 kg) 05/29/16 : 116 lb 13.5 oz (53 kg) 04/01/16 : 115 lb (52.164 kg)   HENT:  Head: Normocephalic.  Right Ear: External ear normal.  Left Ear: External ear normal.  Mouth/Throat: Oropharynx is clear and moist.  Eyes: Conjunctivae and EOM are normal. Pupils are equal, round, and reactive to light.  Neck: Normal range of motion. Neck supple. No thyromegaly present.  Cardiovascular: Normal rate, regular rhythm, normal heart sounds and intact distal pulses.   Pulmonary/Chest: Effort normal and breath sounds normal. She has no wheezes. She has no rales.  Abdominal: Soft. Bowel sounds are normal. She exhibits no mass. There is no tenderness.  Musculoskeletal: Normal range of motion.  Lymphadenopathy:    She has no cervical adenopathy.  Neurological: She is alert and oriented to person, place, and time.  Skin: Skin is warm and dry. No rash noted.  Psychiatric: She has a normal mood and affect. Her behavior is normal.          Assessment & Plan:    COPD with recent exacerbation.  Hypoxia, resolved.  Oxygen therapy discontinued .  Deconditioning.  Will continue home physical therapy  essential hypertension, stable.  Patient states that she had accelerated hypertension during her hospital stay .  Anxiety disorder .  Constipation.  This issue addressed  .  Continue home physical therapy .  Increase level of activity .  Discontinue home O2  .  Recheck 2-3 months  [name], MD

## 2016-06-12 DIAGNOSIS — I10 Essential (primary) hypertension: Secondary | ICD-10-CM | POA: Diagnosis not present

## 2016-06-12 DIAGNOSIS — G8929 Other chronic pain: Secondary | ICD-10-CM | POA: Diagnosis not present

## 2016-06-12 DIAGNOSIS — J441 Chronic obstructive pulmonary disease with (acute) exacerbation: Secondary | ICD-10-CM | POA: Diagnosis not present

## 2016-06-12 DIAGNOSIS — I2510 Atherosclerotic heart disease of native coronary artery without angina pectoris: Secondary | ICD-10-CM | POA: Diagnosis not present

## 2016-06-12 DIAGNOSIS — F418 Other specified anxiety disorders: Secondary | ICD-10-CM | POA: Diagnosis not present

## 2016-06-12 DIAGNOSIS — Z9981 Dependence on supplemental oxygen: Secondary | ICD-10-CM | POA: Diagnosis not present

## 2016-06-13 DIAGNOSIS — Z9981 Dependence on supplemental oxygen: Secondary | ICD-10-CM | POA: Diagnosis not present

## 2016-06-13 DIAGNOSIS — F418 Other specified anxiety disorders: Secondary | ICD-10-CM | POA: Diagnosis not present

## 2016-06-13 DIAGNOSIS — I10 Essential (primary) hypertension: Secondary | ICD-10-CM | POA: Diagnosis not present

## 2016-06-13 DIAGNOSIS — I2510 Atherosclerotic heart disease of native coronary artery without angina pectoris: Secondary | ICD-10-CM | POA: Diagnosis not present

## 2016-06-13 DIAGNOSIS — G8929 Other chronic pain: Secondary | ICD-10-CM | POA: Diagnosis not present

## 2016-06-13 DIAGNOSIS — J441 Chronic obstructive pulmonary disease with (acute) exacerbation: Secondary | ICD-10-CM | POA: Diagnosis not present

## 2016-06-16 DIAGNOSIS — I10 Essential (primary) hypertension: Secondary | ICD-10-CM | POA: Diagnosis not present

## 2016-06-16 DIAGNOSIS — J441 Chronic obstructive pulmonary disease with (acute) exacerbation: Secondary | ICD-10-CM | POA: Diagnosis not present

## 2016-06-16 DIAGNOSIS — G8929 Other chronic pain: Secondary | ICD-10-CM | POA: Diagnosis not present

## 2016-06-16 DIAGNOSIS — F418 Other specified anxiety disorders: Secondary | ICD-10-CM | POA: Diagnosis not present

## 2016-06-16 DIAGNOSIS — I2510 Atherosclerotic heart disease of native coronary artery without angina pectoris: Secondary | ICD-10-CM | POA: Diagnosis not present

## 2016-06-16 DIAGNOSIS — Z9981 Dependence on supplemental oxygen: Secondary | ICD-10-CM | POA: Diagnosis not present

## 2016-06-18 ENCOUNTER — Other Ambulatory Visit: Payer: Self-pay | Admitting: Internal Medicine

## 2016-06-18 NOTE — Telephone Encounter (Signed)
Rx refill sent to pharmacy. 

## 2016-06-19 DIAGNOSIS — J441 Chronic obstructive pulmonary disease with (acute) exacerbation: Secondary | ICD-10-CM | POA: Diagnosis not present

## 2016-06-19 DIAGNOSIS — I10 Essential (primary) hypertension: Secondary | ICD-10-CM | POA: Diagnosis not present

## 2016-06-19 DIAGNOSIS — I2510 Atherosclerotic heart disease of native coronary artery without angina pectoris: Secondary | ICD-10-CM | POA: Diagnosis not present

## 2016-06-19 DIAGNOSIS — Z9981 Dependence on supplemental oxygen: Secondary | ICD-10-CM | POA: Diagnosis not present

## 2016-06-19 DIAGNOSIS — G8929 Other chronic pain: Secondary | ICD-10-CM | POA: Diagnosis not present

## 2016-06-19 DIAGNOSIS — F418 Other specified anxiety disorders: Secondary | ICD-10-CM | POA: Diagnosis not present

## 2016-06-23 DIAGNOSIS — G8929 Other chronic pain: Secondary | ICD-10-CM | POA: Diagnosis not present

## 2016-06-23 DIAGNOSIS — J441 Chronic obstructive pulmonary disease with (acute) exacerbation: Secondary | ICD-10-CM | POA: Diagnosis not present

## 2016-06-23 DIAGNOSIS — Z9981 Dependence on supplemental oxygen: Secondary | ICD-10-CM | POA: Diagnosis not present

## 2016-06-23 DIAGNOSIS — I2510 Atherosclerotic heart disease of native coronary artery without angina pectoris: Secondary | ICD-10-CM | POA: Diagnosis not present

## 2016-06-23 DIAGNOSIS — F418 Other specified anxiety disorders: Secondary | ICD-10-CM | POA: Diagnosis not present

## 2016-06-23 DIAGNOSIS — I10 Essential (primary) hypertension: Secondary | ICD-10-CM | POA: Diagnosis not present

## 2016-06-24 ENCOUNTER — Telehealth: Payer: Self-pay | Admitting: Internal Medicine

## 2016-06-24 DIAGNOSIS — G8929 Other chronic pain: Secondary | ICD-10-CM | POA: Diagnosis not present

## 2016-06-24 DIAGNOSIS — I2510 Atherosclerotic heart disease of native coronary artery without angina pectoris: Secondary | ICD-10-CM | POA: Diagnosis not present

## 2016-06-24 DIAGNOSIS — J441 Chronic obstructive pulmonary disease with (acute) exacerbation: Secondary | ICD-10-CM | POA: Diagnosis not present

## 2016-06-24 DIAGNOSIS — Z9981 Dependence on supplemental oxygen: Secondary | ICD-10-CM | POA: Diagnosis not present

## 2016-06-24 DIAGNOSIS — I10 Essential (primary) hypertension: Secondary | ICD-10-CM | POA: Diagnosis not present

## 2016-06-24 DIAGNOSIS — F418 Other specified anxiety disorders: Secondary | ICD-10-CM | POA: Diagnosis not present

## 2016-06-24 NOTE — Telephone Encounter (Signed)
Pt need you to send a fax for [facility] to go out and pick up the oxygen.  [facility]  Fax#  [phone]   Cust Id # [id number].   Pts son would like to be contacted.

## 2016-06-25 DIAGNOSIS — J441 Chronic obstructive pulmonary disease with (acute) exacerbation: Secondary | ICD-10-CM | POA: Diagnosis not present

## 2016-06-25 DIAGNOSIS — F418 Other specified anxiety disorders: Secondary | ICD-10-CM | POA: Diagnosis not present

## 2016-06-25 DIAGNOSIS — Z9981 Dependence on supplemental oxygen: Secondary | ICD-10-CM | POA: Diagnosis not present

## 2016-06-25 DIAGNOSIS — I10 Essential (primary) hypertension: Secondary | ICD-10-CM | POA: Diagnosis not present

## 2016-06-25 DIAGNOSIS — I2510 Atherosclerotic heart disease of native coronary artery without angina pectoris: Secondary | ICD-10-CM | POA: Diagnosis not present

## 2016-06-25 DIAGNOSIS — G8929 Other chronic pain: Secondary | ICD-10-CM | POA: Diagnosis not present

## 2016-06-25 NOTE — Telephone Encounter (Signed)
Spoke with Richardine Service at [facility] and she stated they had gotten the order to D/C oxygen and would go out and pick it up. Called patients son and left message to that effect.

## 2016-06-26 ENCOUNTER — Telehealth: Payer: Self-pay | Admitting: Internal Medicine

## 2016-06-26 DIAGNOSIS — F418 Other specified anxiety disorders: Secondary | ICD-10-CM | POA: Diagnosis not present

## 2016-06-26 DIAGNOSIS — G8929 Other chronic pain: Secondary | ICD-10-CM | POA: Diagnosis not present

## 2016-06-26 DIAGNOSIS — Z9981 Dependence on supplemental oxygen: Secondary | ICD-10-CM | POA: Diagnosis not present

## 2016-06-26 DIAGNOSIS — I10 Essential (primary) hypertension: Secondary | ICD-10-CM | POA: Diagnosis not present

## 2016-06-26 DIAGNOSIS — J441 Chronic obstructive pulmonary disease with (acute) exacerbation: Secondary | ICD-10-CM | POA: Diagnosis not present

## 2016-06-26 DIAGNOSIS — I2510 Atherosclerotic heart disease of native coronary artery without angina pectoris: Secondary | ICD-10-CM | POA: Diagnosis not present

## 2016-06-26 NOTE — Telephone Encounter (Signed)
Legrand Como is calling pt is having increase pain in hip and tramadol 50 mg is not touching the pain. Please advise. Legrand Como is aware md out of office until Monday 06/30/16

## 2016-06-27 NOTE — Telephone Encounter (Signed)
Please advise.  Pt was last seen 06/11/16.

## 2016-06-30 DIAGNOSIS — J441 Chronic obstructive pulmonary disease with (acute) exacerbation: Secondary | ICD-10-CM | POA: Diagnosis not present

## 2016-06-30 DIAGNOSIS — G8929 Other chronic pain: Secondary | ICD-10-CM | POA: Diagnosis not present

## 2016-06-30 DIAGNOSIS — I10 Essential (primary) hypertension: Secondary | ICD-10-CM | POA: Diagnosis not present

## 2016-06-30 DIAGNOSIS — I2510 Atherosclerotic heart disease of native coronary artery without angina pectoris: Secondary | ICD-10-CM | POA: Diagnosis not present

## 2016-06-30 DIAGNOSIS — F418 Other specified anxiety disorders: Secondary | ICD-10-CM | POA: Diagnosis not present

## 2016-06-30 DIAGNOSIS — Z9981 Dependence on supplemental oxygen: Secondary | ICD-10-CM | POA: Diagnosis not present

## 2016-06-30 NOTE — Telephone Encounter (Signed)
Please schedule office follow-up to reevaluate patient's worsening hip pain

## 2016-06-30 NOTE — Telephone Encounter (Signed)
Heather [name] like a call back to discuss this increase and dr Marthann Schiller advice

## 2016-06-30 NOTE — Telephone Encounter (Signed)
Please check on her current clinical status.  Concerning her hip pain

## 2016-07-01 ENCOUNTER — Other Ambulatory Visit: Payer: Self-pay | Admitting: Internal Medicine

## 2016-07-01 DIAGNOSIS — I2510 Atherosclerotic heart disease of native coronary artery without angina pectoris: Secondary | ICD-10-CM | POA: Diagnosis not present

## 2016-07-01 DIAGNOSIS — F418 Other specified anxiety disorders: Secondary | ICD-10-CM | POA: Diagnosis not present

## 2016-07-01 DIAGNOSIS — G8929 Other chronic pain: Secondary | ICD-10-CM | POA: Diagnosis not present

## 2016-07-01 DIAGNOSIS — J441 Chronic obstructive pulmonary disease with (acute) exacerbation: Secondary | ICD-10-CM | POA: Diagnosis not present

## 2016-07-01 DIAGNOSIS — I10 Essential (primary) hypertension: Secondary | ICD-10-CM | POA: Diagnosis not present

## 2016-07-01 DIAGNOSIS — Z9981 Dependence on supplemental oxygen: Secondary | ICD-10-CM | POA: Diagnosis not present

## 2016-07-01 MED ORDER — HYDROCODONE-ACETAMINOPHEN 5-325 MG PO TABS: 1.0000 | ORAL_TABLET | Freq: Four times a day (QID) | ORAL | 0 refills | Status: DC | PRN

## 2016-07-01 NOTE — Telephone Encounter (Signed)
Patient can increase the dose or frequency of the tramadol. We can call in a prescription for Vicodin 5-325 but she would need to have someone pick up the prescription for her

## 2016-07-01 NOTE — Telephone Encounter (Signed)
Pt is requesting that Vicodin Rx be mailed to her and she will have someone take it to the pharmacy.  She states that she will take 2 tramadol until she receives the prescription in the mail.  Please advise Vicodin quantity and directions.

## 2016-07-01 NOTE — Telephone Encounter (Signed)
Pt states she has no way to get here.  Pt states it is all she can do to walk. Pt doesn't leave the house. Pt states home health nurse is coming 2 x week, and an aid that comes 2 x wk and helps her take a shower because she cannot do alone. Pt would like Dr Raliegh Ip to please give a pain medicine or allow pt to take 2 of the tramadol. Pt states she is suffering with this pain.   Pt states she was just in here on 7/5, however was not in this much pain.

## 2016-07-02 DIAGNOSIS — I10 Essential (primary) hypertension: Secondary | ICD-10-CM | POA: Diagnosis not present

## 2016-07-02 DIAGNOSIS — J441 Chronic obstructive pulmonary disease with (acute) exacerbation: Secondary | ICD-10-CM | POA: Diagnosis not present

## 2016-07-02 DIAGNOSIS — Z9981 Dependence on supplemental oxygen: Secondary | ICD-10-CM | POA: Diagnosis not present

## 2016-07-02 DIAGNOSIS — I2510 Atherosclerotic heart disease of native coronary artery without angina pectoris: Secondary | ICD-10-CM | POA: Diagnosis not present

## 2016-07-02 DIAGNOSIS — F418 Other specified anxiety disorders: Secondary | ICD-10-CM | POA: Diagnosis not present

## 2016-07-02 DIAGNOSIS — G8929 Other chronic pain: Secondary | ICD-10-CM | POA: Diagnosis not present

## 2016-07-03 DIAGNOSIS — J441 Chronic obstructive pulmonary disease with (acute) exacerbation: Secondary | ICD-10-CM | POA: Diagnosis not present

## 2016-07-03 DIAGNOSIS — Z9981 Dependence on supplemental oxygen: Secondary | ICD-10-CM | POA: Diagnosis not present

## 2016-07-03 DIAGNOSIS — I2510 Atherosclerotic heart disease of native coronary artery without angina pectoris: Secondary | ICD-10-CM | POA: Diagnosis not present

## 2016-07-03 DIAGNOSIS — I10 Essential (primary) hypertension: Secondary | ICD-10-CM | POA: Diagnosis not present

## 2016-07-03 DIAGNOSIS — F418 Other specified anxiety disorders: Secondary | ICD-10-CM | POA: Diagnosis not present

## 2016-07-03 DIAGNOSIS — G8929 Other chronic pain: Secondary | ICD-10-CM | POA: Diagnosis not present

## 2016-07-04 DIAGNOSIS — F418 Other specified anxiety disorders: Secondary | ICD-10-CM | POA: Diagnosis not present

## 2016-07-04 DIAGNOSIS — J441 Chronic obstructive pulmonary disease with (acute) exacerbation: Secondary | ICD-10-CM | POA: Diagnosis not present

## 2016-07-04 DIAGNOSIS — I10 Essential (primary) hypertension: Secondary | ICD-10-CM | POA: Diagnosis not present

## 2016-07-04 DIAGNOSIS — I2510 Atherosclerotic heart disease of native coronary artery without angina pectoris: Secondary | ICD-10-CM | POA: Diagnosis not present

## 2016-07-04 DIAGNOSIS — G8929 Other chronic pain: Secondary | ICD-10-CM | POA: Diagnosis not present

## 2016-07-04 DIAGNOSIS — Z9981 Dependence on supplemental oxygen: Secondary | ICD-10-CM | POA: Diagnosis not present

## 2016-07-07 DIAGNOSIS — Z9981 Dependence on supplemental oxygen: Secondary | ICD-10-CM | POA: Diagnosis not present

## 2016-07-07 DIAGNOSIS — I10 Essential (primary) hypertension: Secondary | ICD-10-CM | POA: Diagnosis not present

## 2016-07-07 DIAGNOSIS — F418 Other specified anxiety disorders: Secondary | ICD-10-CM | POA: Diagnosis not present

## 2016-07-07 DIAGNOSIS — J441 Chronic obstructive pulmonary disease with (acute) exacerbation: Secondary | ICD-10-CM | POA: Diagnosis not present

## 2016-07-07 DIAGNOSIS — I2510 Atherosclerotic heart disease of native coronary artery without angina pectoris: Secondary | ICD-10-CM | POA: Diagnosis not present

## 2016-07-07 DIAGNOSIS — G8929 Other chronic pain: Secondary | ICD-10-CM | POA: Diagnosis not present

## 2016-07-08 ENCOUNTER — Other Ambulatory Visit: Payer: Self-pay | Admitting: Internal Medicine

## 2016-07-08 DIAGNOSIS — I2510 Atherosclerotic heart disease of native coronary artery without angina pectoris: Secondary | ICD-10-CM | POA: Diagnosis not present

## 2016-07-08 DIAGNOSIS — G8929 Other chronic pain: Secondary | ICD-10-CM | POA: Diagnosis not present

## 2016-07-08 DIAGNOSIS — I10 Essential (primary) hypertension: Secondary | ICD-10-CM | POA: Diagnosis not present

## 2016-07-08 DIAGNOSIS — Z9981 Dependence on supplemental oxygen: Secondary | ICD-10-CM | POA: Diagnosis not present

## 2016-07-08 DIAGNOSIS — J441 Chronic obstructive pulmonary disease with (acute) exacerbation: Secondary | ICD-10-CM | POA: Diagnosis not present

## 2016-07-08 DIAGNOSIS — F418 Other specified anxiety disorders: Secondary | ICD-10-CM | POA: Diagnosis not present

## 2016-07-08 NOTE — Telephone Encounter (Signed)
Okay to refill? 

## 2016-07-09 DIAGNOSIS — G8929 Other chronic pain: Secondary | ICD-10-CM | POA: Diagnosis not present

## 2016-07-09 DIAGNOSIS — I2510 Atherosclerotic heart disease of native coronary artery without angina pectoris: Secondary | ICD-10-CM | POA: Diagnosis not present

## 2016-07-09 DIAGNOSIS — Z9981 Dependence on supplemental oxygen: Secondary | ICD-10-CM | POA: Diagnosis not present

## 2016-07-09 DIAGNOSIS — F418 Other specified anxiety disorders: Secondary | ICD-10-CM | POA: Diagnosis not present

## 2016-07-09 DIAGNOSIS — I10 Essential (primary) hypertension: Secondary | ICD-10-CM | POA: Diagnosis not present

## 2016-07-09 DIAGNOSIS — J441 Chronic obstructive pulmonary disease with (acute) exacerbation: Secondary | ICD-10-CM | POA: Diagnosis not present

## 2016-07-09 NOTE — Telephone Encounter (Signed)
Spoke with patient and she confirmed her son picked up Rx and concerns have been cared for

## 2016-07-09 NOTE — Telephone Encounter (Signed)
Rx sent to pharmacy   

## 2016-07-10 DIAGNOSIS — Z9981 Dependence on supplemental oxygen: Secondary | ICD-10-CM | POA: Diagnosis not present

## 2016-07-10 DIAGNOSIS — G8929 Other chronic pain: Secondary | ICD-10-CM | POA: Diagnosis not present

## 2016-07-10 DIAGNOSIS — I10 Essential (primary) hypertension: Secondary | ICD-10-CM | POA: Diagnosis not present

## 2016-07-10 DIAGNOSIS — F418 Other specified anxiety disorders: Secondary | ICD-10-CM | POA: Diagnosis not present

## 2016-07-10 DIAGNOSIS — J441 Chronic obstructive pulmonary disease with (acute) exacerbation: Secondary | ICD-10-CM | POA: Diagnosis not present

## 2016-07-10 DIAGNOSIS — I2510 Atherosclerotic heart disease of native coronary artery without angina pectoris: Secondary | ICD-10-CM | POA: Diagnosis not present

## 2016-07-14 DIAGNOSIS — I10 Essential (primary) hypertension: Secondary | ICD-10-CM | POA: Diagnosis not present

## 2016-07-14 DIAGNOSIS — Z9981 Dependence on supplemental oxygen: Secondary | ICD-10-CM | POA: Diagnosis not present

## 2016-07-14 DIAGNOSIS — F418 Other specified anxiety disorders: Secondary | ICD-10-CM | POA: Diagnosis not present

## 2016-07-14 DIAGNOSIS — J441 Chronic obstructive pulmonary disease with (acute) exacerbation: Secondary | ICD-10-CM | POA: Diagnosis not present

## 2016-07-14 DIAGNOSIS — I2510 Atherosclerotic heart disease of native coronary artery without angina pectoris: Secondary | ICD-10-CM | POA: Diagnosis not present

## 2016-07-14 DIAGNOSIS — G8929 Other chronic pain: Secondary | ICD-10-CM | POA: Diagnosis not present

## 2016-07-17 DIAGNOSIS — G8929 Other chronic pain: Secondary | ICD-10-CM | POA: Diagnosis not present

## 2016-07-17 DIAGNOSIS — F418 Other specified anxiety disorders: Secondary | ICD-10-CM | POA: Diagnosis not present

## 2016-07-17 DIAGNOSIS — I2510 Atherosclerotic heart disease of native coronary artery without angina pectoris: Secondary | ICD-10-CM | POA: Diagnosis not present

## 2016-07-17 DIAGNOSIS — J441 Chronic obstructive pulmonary disease with (acute) exacerbation: Secondary | ICD-10-CM | POA: Diagnosis not present

## 2016-07-17 DIAGNOSIS — Z9981 Dependence on supplemental oxygen: Secondary | ICD-10-CM | POA: Diagnosis not present

## 2016-07-17 DIAGNOSIS — I10 Essential (primary) hypertension: Secondary | ICD-10-CM | POA: Diagnosis not present

## 2016-07-18 DIAGNOSIS — G8929 Other chronic pain: Secondary | ICD-10-CM | POA: Diagnosis not present

## 2016-07-18 DIAGNOSIS — Z9981 Dependence on supplemental oxygen: Secondary | ICD-10-CM | POA: Diagnosis not present

## 2016-07-18 DIAGNOSIS — J441 Chronic obstructive pulmonary disease with (acute) exacerbation: Secondary | ICD-10-CM | POA: Diagnosis not present

## 2016-07-18 DIAGNOSIS — I2510 Atherosclerotic heart disease of native coronary artery without angina pectoris: Secondary | ICD-10-CM | POA: Diagnosis not present

## 2016-07-18 DIAGNOSIS — F418 Other specified anxiety disorders: Secondary | ICD-10-CM | POA: Diagnosis not present

## 2016-07-18 DIAGNOSIS — I10 Essential (primary) hypertension: Secondary | ICD-10-CM | POA: Diagnosis not present

## 2016-07-21 DIAGNOSIS — Z9981 Dependence on supplemental oxygen: Secondary | ICD-10-CM | POA: Diagnosis not present

## 2016-07-21 DIAGNOSIS — I2510 Atherosclerotic heart disease of native coronary artery without angina pectoris: Secondary | ICD-10-CM | POA: Diagnosis not present

## 2016-07-21 DIAGNOSIS — J441 Chronic obstructive pulmonary disease with (acute) exacerbation: Secondary | ICD-10-CM | POA: Diagnosis not present

## 2016-07-21 DIAGNOSIS — I10 Essential (primary) hypertension: Secondary | ICD-10-CM | POA: Diagnosis not present

## 2016-07-21 DIAGNOSIS — G8929 Other chronic pain: Secondary | ICD-10-CM | POA: Diagnosis not present

## 2016-07-21 DIAGNOSIS — F418 Other specified anxiety disorders: Secondary | ICD-10-CM | POA: Diagnosis not present

## 2016-07-22 DIAGNOSIS — Z9981 Dependence on supplemental oxygen: Secondary | ICD-10-CM | POA: Diagnosis not present

## 2016-07-22 DIAGNOSIS — F418 Other specified anxiety disorders: Secondary | ICD-10-CM | POA: Diagnosis not present

## 2016-07-22 DIAGNOSIS — I10 Essential (primary) hypertension: Secondary | ICD-10-CM | POA: Diagnosis not present

## 2016-07-22 DIAGNOSIS — G8929 Other chronic pain: Secondary | ICD-10-CM | POA: Diagnosis not present

## 2016-07-22 DIAGNOSIS — J441 Chronic obstructive pulmonary disease with (acute) exacerbation: Secondary | ICD-10-CM | POA: Diagnosis not present

## 2016-07-22 DIAGNOSIS — I2510 Atherosclerotic heart disease of native coronary artery without angina pectoris: Secondary | ICD-10-CM | POA: Diagnosis not present

## 2016-07-23 ENCOUNTER — Other Ambulatory Visit: Payer: Self-pay | Admitting: Internal Medicine

## 2016-07-23 DIAGNOSIS — J441 Chronic obstructive pulmonary disease with (acute) exacerbation: Secondary | ICD-10-CM | POA: Diagnosis not present

## 2016-07-23 DIAGNOSIS — G8929 Other chronic pain: Secondary | ICD-10-CM | POA: Diagnosis not present

## 2016-07-23 DIAGNOSIS — I2510 Atherosclerotic heart disease of native coronary artery without angina pectoris: Secondary | ICD-10-CM | POA: Diagnosis not present

## 2016-07-23 DIAGNOSIS — Z9981 Dependence on supplemental oxygen: Secondary | ICD-10-CM | POA: Diagnosis not present

## 2016-07-23 DIAGNOSIS — F418 Other specified anxiety disorders: Secondary | ICD-10-CM | POA: Diagnosis not present

## 2016-07-23 DIAGNOSIS — I10 Essential (primary) hypertension: Secondary | ICD-10-CM | POA: Diagnosis not present

## 2016-07-23 NOTE — Telephone Encounter (Signed)
Okay to refill? 

## 2016-07-24 ENCOUNTER — Telehealth: Payer: Self-pay | Admitting: Internal Medicine

## 2016-07-24 DIAGNOSIS — G8929 Other chronic pain: Secondary | ICD-10-CM | POA: Diagnosis not present

## 2016-07-24 DIAGNOSIS — Z9981 Dependence on supplemental oxygen: Secondary | ICD-10-CM | POA: Diagnosis not present

## 2016-07-24 DIAGNOSIS — I10 Essential (primary) hypertension: Secondary | ICD-10-CM | POA: Diagnosis not present

## 2016-07-24 DIAGNOSIS — I2510 Atherosclerotic heart disease of native coronary artery without angina pectoris: Secondary | ICD-10-CM | POA: Diagnosis not present

## 2016-07-24 DIAGNOSIS — F418 Other specified anxiety disorders: Secondary | ICD-10-CM | POA: Diagnosis not present

## 2016-07-24 DIAGNOSIS — J441 Chronic obstructive pulmonary disease with (acute) exacerbation: Secondary | ICD-10-CM | POA: Diagnosis not present

## 2016-07-24 NOTE — Telephone Encounter (Signed)
Pt would like for [place] to mail her hydrocodone rx to [facility]

## 2016-07-25 NOTE — Telephone Encounter (Signed)
Okay to refill? 

## 2016-07-25 NOTE — Telephone Encounter (Signed)
Rx refill sent to pharmacy. 

## 2016-07-25 NOTE — Telephone Encounter (Signed)
Okay for refill?  

## 2016-07-25 NOTE — Telephone Encounter (Signed)
Prescription printed, signed and put in the mail for [facility].

## 2016-07-28 DIAGNOSIS — G8929 Other chronic pain: Secondary | ICD-10-CM | POA: Diagnosis not present

## 2016-07-28 DIAGNOSIS — J441 Chronic obstructive pulmonary disease with (acute) exacerbation: Secondary | ICD-10-CM | POA: Diagnosis not present

## 2016-07-28 DIAGNOSIS — I10 Essential (primary) hypertension: Secondary | ICD-10-CM | POA: Diagnosis not present

## 2016-07-28 DIAGNOSIS — I2510 Atherosclerotic heart disease of native coronary artery without angina pectoris: Secondary | ICD-10-CM | POA: Diagnosis not present

## 2016-07-28 DIAGNOSIS — Z9981 Dependence on supplemental oxygen: Secondary | ICD-10-CM | POA: Diagnosis not present

## 2016-07-28 DIAGNOSIS — F418 Other specified anxiety disorders: Secondary | ICD-10-CM | POA: Diagnosis not present

## 2016-07-30 DIAGNOSIS — F418 Other specified anxiety disorders: Secondary | ICD-10-CM | POA: Diagnosis not present

## 2016-07-30 DIAGNOSIS — G8929 Other chronic pain: Secondary | ICD-10-CM | POA: Diagnosis not present

## 2016-07-30 DIAGNOSIS — Z9981 Dependence on supplemental oxygen: Secondary | ICD-10-CM | POA: Diagnosis not present

## 2016-07-30 DIAGNOSIS — J441 Chronic obstructive pulmonary disease with (acute) exacerbation: Secondary | ICD-10-CM | POA: Diagnosis not present

## 2016-07-30 DIAGNOSIS — I10 Essential (primary) hypertension: Secondary | ICD-10-CM | POA: Diagnosis not present

## 2016-07-30 DIAGNOSIS — I2510 Atherosclerotic heart disease of native coronary artery without angina pectoris: Secondary | ICD-10-CM | POA: Diagnosis not present

## 2016-08-01 ENCOUNTER — Ambulatory Visit: Payer: Medicare Other | Admitting: Internal Medicine

## 2016-08-12 ENCOUNTER — Other Ambulatory Visit: Payer: Self-pay | Admitting: Internal Medicine

## 2016-08-26 ENCOUNTER — Other Ambulatory Visit: Payer: Self-pay | Admitting: Internal Medicine

## 2016-08-26 NOTE — Telephone Encounter (Signed)
Please advise if refill okay, last fill 9/8, # 20.

## 2016-08-26 NOTE — Telephone Encounter (Signed)
Okay for refill?  

## 2016-09-08 ENCOUNTER — Other Ambulatory Visit: Payer: Self-pay | Admitting: Internal Medicine

## 2016-09-15 ENCOUNTER — Other Ambulatory Visit: Payer: Self-pay | Admitting: Internal Medicine

## 2016-09-22 ENCOUNTER — Encounter: Payer: Self-pay | Admitting: Internal Medicine

## 2016-09-22 ENCOUNTER — Ambulatory Visit (INDEPENDENT_AMBULATORY_CARE_PROVIDER_SITE_OTHER): Payer: Medicare Other | Admitting: Internal Medicine

## 2016-09-22 VITALS — BP 148/68 | HR 82 | Temp 97.8°F | Ht 59.0 in | Wt 111.4 lb

## 2016-09-22 DIAGNOSIS — F411 Generalized anxiety disorder: Secondary | ICD-10-CM

## 2016-09-22 DIAGNOSIS — I10 Essential (primary) hypertension: Secondary | ICD-10-CM

## 2016-09-22 DIAGNOSIS — J441 Chronic obstructive pulmonary disease with (acute) exacerbation: Secondary | ICD-10-CM | POA: Diagnosis not present

## 2016-09-22 DIAGNOSIS — Z23 Encounter for immunization: Secondary | ICD-10-CM | POA: Diagnosis not present

## 2016-09-22 DIAGNOSIS — E039 Hypothyroidism, unspecified: Secondary | ICD-10-CM | POA: Diagnosis not present

## 2016-09-22 DIAGNOSIS — I2510 Atherosclerotic heart disease of native coronary artery without angina pectoris: Secondary | ICD-10-CM | POA: Diagnosis not present

## 2016-09-22 MED ORDER — PREDNISONE 20 MG PO TABS: 20.0000 mg | ORAL_TABLET | Freq: Two times a day (BID) | ORAL | 0 refills | Status: DC

## 2016-09-22 MED ORDER — ALBUTEROL SULFATE (2.5 MG/3ML) 0.083% IN NEBU: 2.5000 mg | INHALATION_SOLUTION | Freq: Four times a day (QID) | RESPIRATORY_TRACT | 1 refills | Status: DC | PRN

## 2016-09-22 MED ORDER — SERTRALINE HCL 50 MG PO TABS: 50.0000 mg | ORAL_TABLET | Freq: Every day | ORAL | 3 refills | Status: DC

## 2016-09-22 NOTE — Progress Notes (Signed)
Subjective:    Patient ID: Heather [name], female    DOB: Jul 05, 1935, 81 y.o.   MRN: [id number]  HPI  81 year old patient who is seen today for follow-up.  She presents with a one-week history of increasing sore throat, neck discomfort and increasing wheezing.  She does have a history of COPD and has been treated with home bronchodilators via nebulizer.  She's had increasing cough and wheezing over the past 7-10 days.  No fever. She has a history of anxiety, depression and feels that Zoloft has not been very effective.  She has only been on a 25 mg dose. She is complaining of worsening low back pain.  She has treated hypothyroidism.  Past Medical History:  Diagnosis Date  . ANXIETY 07/23/2009  . Atherosclerosis of aorta ([place]) 07/24/2015  . Chronic pain   . Complication of anesthesia 7 or 8 yrs ago   woke up during colonscopy  . Coronary atherosclerosis 07/24/2015  . DEPRESSION 07/23/2009  . DIVERTICULOSIS, COLON 07/23/2009  . GERD 07/23/2009  . Headache(784.0) 07/23/2009   occasional  . Hemorrhoids   . HIP PAIN, LEFT 05/16/2010  . History of Crohn's disease   . HYPERLIPIDEMIA 07/23/2009  . HYPERTENSION 07/23/2009  . HYPOTHYROIDISM 07/23/2009  . IBS (irritable bowel syndrome)   . Insomnia   . LOW BACK PAIN, CHRONIC 10/01/2009  . PARESTHESIA 10/01/2009  . Shingles 2006   back     Social History   Social History  . Marital status: Widowed    Spouse name: N/A  . Number of children: 1  . Years of education: N/A   Occupational History  . Not on file.   Social History Main Topics  . Smoking status: Never Smoker  . Smokeless tobacco: Never Used     Comment: smoked rarely in youth  . Alcohol use No  . Drug use: No  . Sexual activity: Not on file   Other Topics Concern  . Not on file   Social History Narrative   Widowed.  Lives alone in her own home.  Ambulates with a cane/walker when needed.    Past Surgical History:  Procedure Laterality Date  . ABDOMINAL  HYSTERECTOMY  age 44 or 26  . APPENDECTOMY  yrs ago  . BILATERAL SALPINGOOPHORECTOMY  age 65 or 15  . COLONOSCOPY N/A 11/17/2013   Procedure: COLONOSCOPY;  Surgeon: Cleotis Nipper, MD;  Location: [facility];  Service: Endoscopy;  Laterality: N/A;  . ESOPHAGOGASTRODUODENOSCOPY N/A 11/17/2013   Procedure: ESOPHAGOGASTRODUODENOSCOPY (EGD);  Surgeon: Cleotis Nipper, MD;  Location: [facility];  Service: Endoscopy;  Laterality: N/A;  . HEMORRHOID SURGERY  yrs ago  . TONSILLECTOMY  yrs ago    Family History  Problem Relation Age of Onset  . Clotting disorder Neg Hx   . COPD Sister   . Cancer Sister     Breast  . Cancer Mother     Unknown type    Allergies  Allergen Reactions  . Tylenol [Acetaminophen] Other (See Comments)    Nightmares  . Symbicort [Budesonide-Formoterol Fumarate] Other (See Comments)    Pt felt like her tongue was swollen, could not swallow  . Duloxetine Other (See Comments)    Patient doesn't recall  . Metronidazole Other (See Comments)    Patient doesn't recall   . Rofecoxib Other (See Comments)    Patient doesn't recall     Current Outpatient Prescriptions on File Prior to Visit  Medication Sig Dispense Refill  . benzonatate (TESSALON) 100 MG  capsule TAKE 1 CAPSULE TWICE DAILY AS NEEDED FOR COUGH. 20 capsule 0  . Cholecalciferol 2000 UNITS TABS Take 2,000 Units by mouth daily.    . clorazepate (TRANXENE) 7.5 MG tablet Take 1 tablet (7.5 mg total) by mouth 2 (two) times daily as needed for anxiety. 180 tablet 1  . diltiazem (CARDIZEM CD) 240 MG 24 hr capsule TAKE (1) CAPSULE DAILY. 90 capsule 1  . docusate sodium (COLACE) 100 MG capsule Take 1 capsule (100 mg total) by mouth 2 (two) times daily. 60 capsule 0  . feeding supplement, ENSURE ENLIVE, (ENSURE ENLIVE) LIQD Take 237 mLs by mouth 2 (two) times daily between meals. (Patient taking differently: Take 237 mLs by mouth 2 (two) times daily as needed (poor appetite). ) 237 mL 12  . furosemide (LASIX)  20 MG tablet TAKE 1 TABLET DAILY IF NEEDED FOR SIGNIFICANT LOWER EXTREMITY SWELLING. 30 tablet 2  . HYDROcodone-acetaminophen (NORCO/VICODIN) 5-325 MG tablet TAKE 1 TABLET EVERY 6 HOURS AS NEEDED FOR MODERATE PAIN. 60 tablet 0  . levothyroxine (SYNTHROID, LEVOTHROID) 25 MCG tablet TAKE 1 TABLET DAILY BEFORE BREAKFAST. 90 tablet 1  . NEXIUM 40 MG capsule TAKE 1 CAPSULE DAILY BEFORE BREAKFAST. 90 capsule 1  . Polyethyl Glycol-Propyl Glycol (SYSTANE OP) Place 1 drop into both eyes 2 (two) times daily as needed (for dry eyes).     . polyethylene glycol (MIRALAX / GLYCOLAX) packet Take 17 g by mouth 2 (two) times daily. 60 each 0  . PROAIR RESPICLICK 188 (90 Base) MCG/ACT AEPB INHALE 2 PUFFS INTO THE LUNGS EVERY 6 HOURS AS NEEDED. 1 each 3  . promethazine (PHENERGAN) 25 MG tablet TAKE 1 TABLET EVERY 8 HOURS AS NEEDED FOR NAUSEA. 20 tablet 0  . sertraline (ZOLOFT) 25 MG tablet TAKE 1 TABLET EACH DAY. 90 tablet 0  . tiotropium (SPIRIVA HANDIHALER) 18 MCG inhalation capsule Place 1 capsule (18 mcg total) into inhaler and inhale daily. 30 capsule 12  . traMADol (ULTRAM) 50 MG tablet TAKE 1 TABLET EVERY SIX HOURS AS NEEDED FOR PAIN. 60 tablet 5  . traZODone (DESYREL) 50 MG tablet TAKE 1/2 TO 1 TABLET AT BEDTIME AS NEEDED FOR REST. 60 tablet 2  . zolpidem (AMBIEN) 5 MG tablet Take 1 tablet (5 mg total) by mouth at bedtime as needed. (Patient taking differently: Take 5 mg by mouth at bedtime as needed for sleep. ) 30 tablet 5   No current facility-administered medications on file prior to visit.     BP (!) 148/68 (BP Location: Left Arm, Patient Position: Sitting, Cuff Size: Normal)   Pulse 82   Temp 97.8 F (36.6 C) (Oral)   Ht '4\' 11"'$  (1.499 m)   Wt 111 lb 6.4 oz (50.5 kg)   SpO2 97%   BMI 22.50 kg/m      Review of Systems  Constitutional: Positive for activity change, appetite change and fatigue.  HENT: Negative for congestion, dental problem, hearing loss, rhinorrhea, sinus pressure, sore  throat and tinnitus.   Eyes: Negative for pain, discharge and visual disturbance.  Respiratory: Positive for shortness of breath and wheezing. Negative for cough.   Cardiovascular: Negative for chest pain, palpitations and leg swelling.  Gastrointestinal: Negative for abdominal distention, abdominal pain, blood in stool, constipation, diarrhea, nausea and vomiting.  Genitourinary: Negative for difficulty urinating, dysuria, flank pain, frequency, hematuria, pelvic pain, urgency, vaginal bleeding, vaginal discharge and vaginal pain.  Musculoskeletal: Positive for arthralgias and back pain. Negative for gait problem and joint swelling.  Skin: Negative  for rash.  Neurological: Negative for dizziness, syncope, speech difficulty, weakness, numbness and headaches.  Hematological: Negative for adenopathy.  Psychiatric/Behavioral: Negative for agitation, behavioral problems and dysphoric mood. The patient is nervous/anxious.        Objective:   Physical Exam  Constitutional: She is oriented to person, place, and time. She appears well-developed and well-nourished.  HENT:  Head: Normocephalic.  Right Ear: External ear normal.  Left Ear: External ear normal.  Mouth/Throat: Oropharynx is clear and moist.  Eyes: Conjunctivae and EOM are normal. Pupils are equal, round, and reactive to light.  Neck: Normal range of motion. Neck supple. No thyromegaly present.  Cardiovascular: Normal rate, regular rhythm, normal heart sounds and intact distal pulses.   Pulmonary/Chest: Effort normal. She has wheezes.  Abdominal: Soft. Bowel sounds are normal. She exhibits no mass. There is no tenderness.  Musculoskeletal: Normal range of motion. She exhibits no edema.  Lymphadenopathy:    She has no cervical adenopathy.  Neurological: She is alert and oriented to person, place, and time.  Skin: Skin is warm and dry. No rash noted.  Psychiatric: She has a normal mood and affect. Her behavior is normal.           Assessment & Plan:    Exacerbation COPD.  Will continue home nebulizer treatments.  Will treat with prednisone 20 mg twice a day.  Will force fluids and add mucolytic Essential hypertension, stable Anxiety, depression.  Will increase sertraline to 50 mg daily Low back pain.  Tramadol refilled  [name]

## 2016-09-22 NOTE — Patient Instructions (Signed)
Acute bronchitis symptoms for less than 10 days are generally not helped by antibiotics.  Take over-the-counter expectorants and cough medications such as  Mucinex DM.  Call if there is no improvement in 5 to 7 days or if  you develop worsening cough, fever, or new symptoms, such as shortness of breath or chest pain.  Prednisone 20 mg twice daily for 6 days

## 2016-10-02 ENCOUNTER — Other Ambulatory Visit: Payer: Self-pay | Admitting: Internal Medicine

## 2016-10-08 ENCOUNTER — Other Ambulatory Visit: Payer: Self-pay | Admitting: Internal Medicine

## 2016-10-09 ENCOUNTER — Other Ambulatory Visit: Payer: Self-pay

## 2016-10-09 MED ORDER — CLORAZEPATE DIPOTASSIUM 7.5 MG PO TABS: 7.5000 mg | ORAL_TABLET | Freq: Two times a day (BID) | ORAL | 1 refills | Status: DC | PRN

## 2016-10-13 ENCOUNTER — Other Ambulatory Visit: Payer: Self-pay | Admitting: Internal Medicine

## 2016-10-27 ENCOUNTER — Other Ambulatory Visit: Payer: Self-pay | Admitting: Internal Medicine

## 2016-11-24 ENCOUNTER — Other Ambulatory Visit: Payer: Self-pay | Admitting: Internal Medicine

## 2016-12-30 ENCOUNTER — Other Ambulatory Visit: Payer: Self-pay | Admitting: Internal Medicine

## 2017-01-09 ENCOUNTER — Telehealth: Payer: Self-pay

## 2017-01-09 NOTE — Telephone Encounter (Signed)
Received PA request for Zolpidem. PA approved & form faxed back to pharmacy.

## 2017-01-26 ENCOUNTER — Other Ambulatory Visit: Payer: Self-pay | Admitting: Internal Medicine

## 2017-02-16 ENCOUNTER — Other Ambulatory Visit: Payer: Self-pay | Admitting: Internal Medicine

## 2017-03-02 ENCOUNTER — Other Ambulatory Visit: Payer: Self-pay | Admitting: Internal Medicine

## 2017-03-09 ENCOUNTER — Telehealth: Payer: Self-pay | Admitting: Internal Medicine

## 2017-03-13 ENCOUNTER — Ambulatory Visit (INDEPENDENT_AMBULATORY_CARE_PROVIDER_SITE_OTHER): Payer: Medicare Other | Admitting: Family Medicine

## 2017-03-13 ENCOUNTER — Telehealth: Payer: Self-pay

## 2017-03-13 ENCOUNTER — Encounter: Payer: Self-pay | Admitting: Family Medicine

## 2017-03-13 VITALS — BP 132/58 | HR 85 | Temp 98.4°F | Ht 59.0 in | Wt 107.4 lb

## 2017-03-13 DIAGNOSIS — J988 Other specified respiratory disorders: Secondary | ICD-10-CM | POA: Diagnosis not present

## 2017-03-13 DIAGNOSIS — J441 Chronic obstructive pulmonary disease with (acute) exacerbation: Secondary | ICD-10-CM | POA: Diagnosis not present

## 2017-03-13 MED ORDER — TIOTROPIUM BROMIDE MONOHYDRATE 18 MCG IN CAPS: 1.0000 | ORAL_CAPSULE | Freq: Every day | RESPIRATORY_TRACT | 0 refills | Status: DC

## 2017-03-13 MED ORDER — PREDNISONE 20 MG PO TABS: 40.0000 mg | ORAL_TABLET | Freq: Every day | ORAL | 0 refills | Status: DC

## 2017-03-13 MED ORDER — DOXYCYCLINE HYCLATE 100 MG PO TABS: 100.0000 mg | ORAL_TABLET | Freq: Two times a day (BID) | ORAL | 0 refills | Status: DC

## 2017-03-13 NOTE — Telephone Encounter (Signed)
Call to Ms. Brix to reschedule AWV; was scheduled for Monday the 16th after 3:45 pm apt with Dr. Raliegh Ip.  States she has sick, having difficulty breathing. States she has her inhalers, is using her nebulizer as well as proair q 5 hours.. c/o of sob during activity; does not know oxygen level; thermometer is broke.  Asked how long she has been sick and she states since Christmas, this episode does not improve. Has not been out of the house.   Advised  to come in for fast track but can't due to no transportation. Son works in [employer] and can't get off.  States the prednisone has helped her in the past.  Dr. Raliegh Ip is not here until Tuesday Called the patient back and stated her son will get off early today and scheduled her for fast track apt at 4:15 today. The patient confirmed

## 2017-03-13 NOTE — Progress Notes (Signed)
HPI:  Acute visit for Cough and SOB: -started about 1-2 weeks ago with sub fever, congestion, cough, chills -now worsening the last few days with increase sputum that is white and yellow, wheezing, SOB in increase inhaler use -she has COPD and reports when she feels this way prednisone is a miracle -denies vomiting, increased body aches, dizziness, weakness, CP -also requests refill of her spiriva  ROS: See pertinent positives and negatives per HPI.  Past Medical History:  Diagnosis Date  . ANXIETY 07/23/2009  . Atherosclerosis of aorta ([place]) 07/24/2015  . Chronic pain   . Complication of anesthesia 7 or 8 yrs ago   woke up during colonscopy  . Coronary atherosclerosis 07/24/2015  . DEPRESSION 07/23/2009  . DIVERTICULOSIS, COLON 07/23/2009  . GERD 07/23/2009  . Headache(784.0) 07/23/2009   occasional  . Hemorrhoids   . HIP PAIN, LEFT 05/16/2010  . History of Crohn's disease   . HYPERLIPIDEMIA 07/23/2009  . HYPERTENSION 07/23/2009  . HYPOTHYROIDISM 07/23/2009  . IBS (irritable bowel syndrome)   . Insomnia   . LOW BACK PAIN, CHRONIC 10/01/2009  . PARESTHESIA 10/01/2009  . Shingles 2006   back    Past Surgical History:  Procedure Laterality Date  . ABDOMINAL HYSTERECTOMY  age 48 or 63  . APPENDECTOMY  yrs ago  . BILATERAL SALPINGOOPHORECTOMY  age 26 or 57  . COLONOSCOPY N/A 11/17/2013   Procedure: COLONOSCOPY;  Surgeon: Cleotis Nipper, MD;  Location: [facility];  Service: Endoscopy;  Laterality: N/A;  . ESOPHAGOGASTRODUODENOSCOPY N/A 11/17/2013   Procedure: ESOPHAGOGASTRODUODENOSCOPY (EGD);  Surgeon: Cleotis Nipper, MD;  Location: [facility];  Service: Endoscopy;  Laterality: N/A;  . HEMORRHOID SURGERY  yrs ago  . TONSILLECTOMY  yrs ago    Family History  Problem Relation Age of Onset  . Clotting disorder Neg Hx   . COPD Sister   . Cancer Sister     Breast  . Cancer Mother     Unknown type    Social History   Social History  . Marital status: Widowed   Spouse name: N/A  . Number of children: 1  . Years of education: N/A   Social History Main Topics  . Smoking status: Never Smoker  . Smokeless tobacco: Never Used     Comment: smoked rarely in youth  . Alcohol use No  . Drug use: No  . Sexual activity: Not Asked   Other Topics Concern  . None   Social History Narrative   Widowed.  Lives alone in her own home.  Ambulates with a cane/walker when needed.     Current Outpatient Prescriptions:  .  albuterol (PROVENTIL) (2.5 MG/3ML) 0.083% nebulizer solution, Take 3 mLs (2.5 mg total) by nebulization every 6 (six) hours as needed for wheezing or shortness of breath., Disp: 240 mL, Rfl: 1 .  benzonatate (TESSALON) 100 MG capsule, TAKE 1 CAPSULE TWICE DAILY AS NEEDED FOR COUGH., Disp: 20 capsule, Rfl: 0 .  Cholecalciferol 2000 UNITS TABS, Take 2,000 Units by mouth daily., Disp: , Rfl:  .  clorazepate (TRANXENE) 7.5 MG tablet, Take 1 tablet (7.5 mg total) by mouth 2 (two) times daily as needed for anxiety., Disp: 180 tablet, Rfl: 1 .  diltiazem (CARDIZEM CD) 240 MG 24 hr capsule, TAKE (1) CAPSULE DAILY., Disp: 90 capsule, Rfl: 1 .  docusate sodium (COLACE) 100 MG capsule, Take 1 capsule (100 mg total) by mouth 2 (two) times daily., Disp: 60 capsule, Rfl: 0 .  feeding supplement, ENSURE ENLIVE, (ENSURE  ENLIVE) LIQD, Take 237 mLs by mouth 2 (two) times daily between meals. (Patient taking differently: Take 237 mLs by mouth 2 (two) times daily as needed (poor appetite). ), Disp: 237 mL, Rfl: 12 .  furosemide (LASIX) 20 MG tablet, TAKE 1 TABLET DAILY IF NEEDED FOR SIGNIFICANT LOWER EXTREMITY SWELLING., Disp: 30 tablet, Rfl: 2 .  HYDROcodone-acetaminophen (NORCO/VICODIN) 5-325 MG tablet, TAKE 1 TABLET EVERY 6 HOURS AS NEEDED FOR MODERATE PAIN., Disp: 60 tablet, Rfl: 0 .  levothyroxine (SYNTHROID, LEVOTHROID) 25 MCG tablet, TAKE 1 TABLET DAILY BEFORE BREAKFAST., Disp: 90 tablet, Rfl: 0 .  NEXIUM 40 MG capsule, TAKE 1 CAPSULE DAILY BEFORE BREAKFAST.,  Disp: 90 capsule, Rfl: 0 .  Polyethyl Glycol-Propyl Glycol (SYSTANE OP), Place 1 drop into both eyes 2 (two) times daily as needed (for dry eyes). , Disp: , Rfl:  .  polyethylene glycol (MIRALAX / GLYCOLAX) packet, Take 17 g by mouth 2 (two) times daily., Disp: 60 each, Rfl: 0 .  PROAIR RESPICLICK 093 (90 Base) MCG/ACT AEPB, INHALE 2 PUFFS INTO THE LUNGS EVERY 6 HOURS AS NEEDED., Disp: 1 each, Rfl: 3 .  promethazine (PHENERGAN) 25 MG tablet, TAKE 1 TABLET EVERY 8 HOURS AS NEEDED FOR NAUSEA., Disp: 20 tablet, Rfl: 0 .  sertraline (ZOLOFT) 50 MG tablet, Take 1 tablet (50 mg total) by mouth daily., Disp: 90 tablet, Rfl: 3 .  tiotropium (SPIRIVA HANDIHALER) 18 MCG inhalation capsule, Place 1 capsule (18 mcg total) into inhaler and inhale daily., Disp: 30 capsule, Rfl: 0 .  traMADol (ULTRAM) 50 MG tablet, TAKE 1 TABLET EVERY SIX HOURS AS NEEDED FOR PAIN., Disp: 60 tablet, Rfl: 0 .  traZODone (DESYREL) 50 MG tablet, TAKE 1/2 TO 1 TABLET AT BEDTIME AS NEEDED FOR REST., Disp: 60 tablet, Rfl: 0 .  zolpidem (AMBIEN) 5 MG tablet, TAKE 1 TABLET AT BEDTIME AS NEEDED., Disp: 30 tablet, Rfl: 0 .  doxycycline (VIBRA-TABS) 100 MG tablet, Take 1 tablet (100 mg total) by mouth 2 (two) times daily., Disp: 10 tablet, Rfl: 0 .  predniSONE (DELTASONE) 20 MG tablet, Take 2 tablets (40 mg total) by mouth daily with breakfast., Disp: 8 tablet, Rfl: 0  EXAM:  Vitals:   03/13/17 1620  BP: (!) 132/58  Pulse: 85  Temp: 98.4 F (36.9 C)    Body mass index is 21.69 kg/m.  GENERAL: vitals reviewed and listed above, alert, oriented, appears well hydrated and in no acute distress  HEENT: atraumatic, conjunttiva clear, no obvious abnormalities on inspection of external nose and ears  NECK: no obvious masses on inspection  LUNGS: difuse wheeze, prolonged exp, no signs resp distress  CV: HRRR, no peripheral edema  MS: moves all extremities without noticeable abnormality  PSYCH: pleasant and cooperative, no obvious  depression or anxiety  ASSESSMENT AND PLAN:  Discussed the following assessment and plan:  COPD with acute exacerbation (HCC)  Respiratory infection  -we discussed possible serious and likely etiologies, workup and treatment, treatment risks and return precautions -after this discussion, Albena opted for prednisone, doxy after discussion risks -follow up advised as schedule and as needed -of course, we advised Shiesha  to return or notify a doctor immediately if symptoms worsen or persist or new concerns arise.    Patient Instructions  Take the prednisone and the antibiotic (doxycycline) as prescribed.  Keep follow up as scheduled.  I hope you are feeling better soon! Seek care immediately if worsening, new concerns or you are not improving with treatment.     KIM,  HANNAH R., DO

## 2017-03-13 NOTE — Patient Instructions (Signed)
Take the prednisone and the antibiotic (doxycycline) as prescribed.  Keep follow up as scheduled.  I hope you are feeling better soon! Seek care immediately if worsening, new concerns or you are not improving with treatment.

## 2017-03-13 NOTE — Progress Notes (Signed)
Pre visit review using our clinic review tool, if applicable. No additional management support is needed unless otherwise documented below in the visit note. 

## 2017-03-16 ENCOUNTER — Ambulatory Visit: Payer: Medicare Other | Admitting: Family Medicine

## 2017-03-17 ENCOUNTER — Telehealth: Payer: Self-pay | Admitting: Internal Medicine

## 2017-03-19 NOTE — Telephone Encounter (Signed)
Pt made aware that Rx is a the pharmacy. Ready for pick up.

## 2017-03-19 NOTE — Telephone Encounter (Signed)
Pt states her home delivery is holding the  zolpidem (AMBIEN) 5 MG tablet Because they have not heard back for approval. Pt hopes to get approved today so they can deliver with the rest of her meds today.   [place], [place]

## 2017-03-23 ENCOUNTER — Encounter: Payer: Self-pay | Admitting: Internal Medicine

## 2017-03-23 ENCOUNTER — Ambulatory Visit: Payer: Medicare Other

## 2017-03-23 ENCOUNTER — Ambulatory Visit (INDEPENDENT_AMBULATORY_CARE_PROVIDER_SITE_OTHER): Payer: Medicare Other | Admitting: Internal Medicine

## 2017-03-23 VITALS — BP 140/58 | HR 77 | Temp 98.2°F | Ht 59.0 in | Wt 107.2 lb

## 2017-03-23 DIAGNOSIS — G8929 Other chronic pain: Secondary | ICD-10-CM

## 2017-03-23 DIAGNOSIS — J441 Chronic obstructive pulmonary disease with (acute) exacerbation: Secondary | ICD-10-CM

## 2017-03-23 DIAGNOSIS — M545 Low back pain, unspecified: Secondary | ICD-10-CM

## 2017-03-23 DIAGNOSIS — I10 Essential (primary) hypertension: Secondary | ICD-10-CM | POA: Diagnosis not present

## 2017-03-23 MED ORDER — HYDROCODONE-ACETAMINOPHEN 5-325 MG PO TABS: ORAL_TABLET | ORAL | 0 refills | Status: DC

## 2017-03-23 NOTE — Patient Instructions (Signed)
Limit your sodium (Salt) intake    It is important that you exercise regularly, at least 20 minutes 3 to 4 times per week.  If you develop chest pain or shortness of breath seek  medical attention.  Return in 3 months for follow-up  

## 2017-03-23 NOTE — Progress Notes (Signed)
Pre visit review using our clinic review tool, if applicable. No additional management support is needed unless otherwise documented below in the visit note. 

## 2017-03-23 NOTE — Progress Notes (Signed)
Subjective:    Patient ID: Heather [name], female    DOB: [birth year], 82 y.o.   MRN: [id number]  HPI  82 year old patient who is seen today following an exacerbation of COPD 10 days ago.  She is much improved and has completed antibiotic therapy as well as oral prednisone.  She feels her pulmonary status is now back to baseline.  Her main complaint today is her chronic low back pain.  Past Medical History:  Diagnosis Date  . ANXIETY 07/23/2009  . Atherosclerosis of aorta ([place]) 07/24/2015  . Chronic pain   . Complication of anesthesia 7 or 8 yrs ago   woke up during colonscopy  . Coronary atherosclerosis 07/24/2015  . DEPRESSION 07/23/2009  . DIVERTICULOSIS, COLON 07/23/2009  . GERD 07/23/2009  . Headache(784.0) 07/23/2009   occasional  . Hemorrhoids   . HIP PAIN, LEFT 05/16/2010  . History of Crohn's disease   . HYPERLIPIDEMIA 07/23/2009  . HYPERTENSION 07/23/2009  . HYPOTHYROIDISM 07/23/2009  . IBS (irritable bowel syndrome)   . Insomnia   . LOW BACK PAIN, CHRONIC 10/01/2009  . PARESTHESIA 10/01/2009  . Shingles 2006   back     Social History   Social History  . Marital status: Widowed    Spouse name: N/A  . Number of children: 1  . Years of education: N/A   Occupational History  . Not on file.   Social History Main Topics  . Smoking status: Never Smoker  . Smokeless tobacco: Never Used     Comment: smoked rarely in youth  . Alcohol use No  . Drug use: No  . Sexual activity: Not on file   Other Topics Concern  . Not on file   Social History Narrative   Widowed.  Lives alone in her own home.  Ambulates with a cane/walker when needed.    Past Surgical History:  Procedure Laterality Date  . ABDOMINAL HYSTERECTOMY  age 21 or 67  . APPENDECTOMY  yrs ago  . BILATERAL SALPINGOOPHORECTOMY  age 89 or 58  . COLONOSCOPY N/A 11/17/2013   Procedure: COLONOSCOPY;  Surgeon: Cleotis Nipper, MD;  Location: [facility];  Service: Endoscopy;  Laterality: N/A;  .  ESOPHAGOGASTRODUODENOSCOPY N/A 11/17/2013   Procedure: ESOPHAGOGASTRODUODENOSCOPY (EGD);  Surgeon: Cleotis Nipper, MD;  Location: [facility];  Service: Endoscopy;  Laterality: N/A;  . HEMORRHOID SURGERY  yrs ago  . TONSILLECTOMY  yrs ago    Family History  Problem Relation Age of Onset  . Clotting disorder Neg Hx   . COPD Sister   . Cancer Sister     Breast  . Cancer Mother     Unknown type    Allergies  Allergen Reactions  . Tylenol [Acetaminophen] Other (See Comments)    Nightmares  . Symbicort [Budesonide-Formoterol Fumarate] Other (See Comments)    Pt felt like her tongue was swollen, could not swallow  . Duloxetine Other (See Comments)    Patient doesn't recall  . Metronidazole Other (See Comments)    Patient doesn't recall   . Rofecoxib Other (See Comments)    Patient doesn't recall     Current Outpatient Prescriptions on File Prior to Visit  Medication Sig Dispense Refill  . albuterol (PROVENTIL) (2.5 MG/3ML) 0.083% nebulizer solution Take 3 mLs (2.5 mg total) by nebulization every 6 (six) hours as needed for wheezing or shortness of breath. 240 mL 1  . benzonatate (TESSALON) 100 MG capsule TAKE 1 CAPSULE TWICE DAILY AS NEEDED FOR COUGH. 20 capsule  0  . Cholecalciferol 2000 UNITS TABS Take 2,000 Units by mouth daily.    . clorazepate (TRANXENE) 7.5 MG tablet Take 1 tablet (7.5 mg total) by mouth 2 (two) times daily as needed for anxiety. 180 tablet 1  . diltiazem (CARDIZEM CD) 240 MG 24 hr capsule TAKE (1) CAPSULE DAILY. 90 capsule 1  . docusate sodium (COLACE) 100 MG capsule Take 1 capsule (100 mg total) by mouth 2 (two) times daily. 60 capsule 0  . feeding supplement, ENSURE ENLIVE, (ENSURE ENLIVE) LIQD Take 237 mLs by mouth 2 (two) times daily between meals. (Patient taking differently: Take 237 mLs by mouth 2 (two) times daily as needed (poor appetite). ) 237 mL 12  . furosemide (LASIX) 20 MG tablet TAKE 1 TABLET DAILY IF NEEDED FOR SIGNIFICANT LOWER EXTREMITY  SWELLING. 30 tablet 2  . levothyroxine (SYNTHROID, LEVOTHROID) 25 MCG tablet TAKE 1 TABLET DAILY BEFORE BREAKFAST. 90 tablet 0  . NEXIUM 40 MG capsule TAKE 1 CAPSULE DAILY BEFORE BREAKFAST. 90 capsule 0  . Polyethyl Glycol-Propyl Glycol (SYSTANE OP) Place 1 drop into both eyes 2 (two) times daily as needed (for dry eyes).     . polyethylene glycol (MIRALAX / GLYCOLAX) packet Take 17 g by mouth 2 (two) times daily. 60 each 0  . PROAIR RESPICLICK 427 (90 Base) MCG/ACT AEPB INHALE 2 PUFFS INTO THE LUNGS EVERY 6 HOURS AS NEEDED. 1 each 3  . promethazine (PHENERGAN) 25 MG tablet TAKE 1 TABLET EVERY 8 HOURS AS NEEDED FOR NAUSEA. 20 tablet 0  . sertraline (ZOLOFT) 50 MG tablet Take 1 tablet (50 mg total) by mouth daily. 90 tablet 3  . tiotropium (SPIRIVA HANDIHALER) 18 MCG inhalation capsule Place 1 capsule (18 mcg total) into inhaler and inhale daily. 30 capsule 0  . traMADol (ULTRAM) 50 MG tablet TAKE 1 TABLET EVERY SIX HOURS AS NEEDED FOR PAIN. 60 tablet 0  . traZODone (DESYREL) 50 MG tablet TAKE 1/2 TO 1 TABLET AT BEDTIME AS NEEDED FOR REST. 60 tablet 0  . zolpidem (AMBIEN) 5 MG tablet TAKE 1 TABLET AT BEDTIME AS NEEDED. 30 tablet 0   No current facility-administered medications on file prior to visit.     BP (!) 140/58 (BP Location: Left Arm, Patient Position: Sitting, Cuff Size: Normal)   Pulse 77   Temp 98.2 F (36.8 C) (Oral)   Ht '4\' 11"'$  (1.499 m)   Wt 107 lb 3.2 oz (48.6 kg)   SpO2 94%   BMI 21.65 kg/m      Review of Systems  Constitutional: Negative.   HENT: Negative for congestion, dental problem, hearing loss, rhinorrhea, sinus pressure, sore throat and tinnitus.   Eyes: Negative for pain, discharge and visual disturbance.  Respiratory: Positive for shortness of breath. Negative for cough.   Cardiovascular: Negative for chest pain, palpitations and leg swelling.  Gastrointestinal: Negative for abdominal distention, abdominal pain, blood in stool, constipation, diarrhea, nausea  and vomiting.  Genitourinary: Negative for difficulty urinating, dysuria, flank pain, frequency, hematuria, pelvic pain, urgency, vaginal bleeding, vaginal discharge and vaginal pain.  Musculoskeletal: Positive for back pain. Negative for arthralgias, gait problem and joint swelling.  Skin: Negative for rash.  Neurological: Negative for dizziness, syncope, speech difficulty, weakness, numbness and headaches.  Hematological: Negative for adenopathy.  Psychiatric/Behavioral: Negative for agitation, behavioral problems and dysphoric mood. The patient is not nervous/anxious.        Objective:   Physical Exam  Constitutional: She is oriented to person, place, and time. She appears  well-developed and well-nourished.  HENT:  Head: Normocephalic.  Right Ear: External ear normal.  Left Ear: External ear normal.  Mouth/Throat: Oropharynx is clear and moist.  Eyes: Conjunctivae and EOM are normal. Pupils are equal, round, and reactive to light.  Neck: Normal range of motion. Neck supple. No thyromegaly present.  Cardiovascular: Normal rate, regular rhythm, normal heart sounds and intact distal pulses.   Pulmonary/Chest: Effort normal and breath sounds normal. No respiratory distress. She has no wheezes. She has no rales.  Chest is clear O2 saturation 94%  Abdominal: Soft. Bowel sounds are normal. She exhibits no mass. There is no tenderness.  Musculoskeletal: Normal range of motion.  Lymphadenopathy:    She has no cervical adenopathy.  Neurological: She is alert and oriented to person, place, and time.  Skin: Skin is warm and dry. No rash noted.  Psychiatric: She has a normal mood and affect. Her behavior is normal.          Assessment & Plan:   COPD exacerbation.  Improved Chronic low back pain Anxiety disorder Essential hypertension, controlled  No change in medical regimen  Follow-up 3 months  Verlee Pope Pilar Plate

## 2017-04-10 ENCOUNTER — Other Ambulatory Visit: Payer: Self-pay | Admitting: Internal Medicine

## 2017-04-13 NOTE — Telephone Encounter (Signed)
Left Message - Called to reschedule awv ( appt is 4/16 - Manuela Schwartz will not be here)Left message for her to call us back.

## 2017-04-20 ENCOUNTER — Other Ambulatory Visit: Payer: Self-pay | Admitting: Internal Medicine

## 2017-04-20 ENCOUNTER — Other Ambulatory Visit: Payer: Self-pay | Admitting: Family Medicine

## 2017-04-20 NOTE — Telephone Encounter (Signed)
Last OV is 03/23/2017, Labs 05/31/16 and last refill was 10/09/2016.Please advise

## 2017-04-21 ENCOUNTER — Other Ambulatory Visit: Payer: Self-pay | Admitting: Internal Medicine

## 2017-04-21 NOTE — Telephone Encounter (Signed)
Patient is aware, left a voice message.

## 2017-04-21 NOTE — Telephone Encounter (Signed)
Pt called back and is aware of the above msg.

## 2017-04-21 NOTE — Telephone Encounter (Signed)
Okay to refill? 

## 2017-04-21 NOTE — Telephone Encounter (Signed)
Noted  

## 2017-04-21 NOTE — Telephone Encounter (Signed)
Patient Rx for Clorazepate 7.5 mg was phoned in to [facility].

## 2017-04-21 NOTE — Telephone Encounter (Signed)
Heather [name] the pharmacy is calling to in reference to the pt Rx pls call Juliann Pulse back at [phone].

## 2017-04-27 ENCOUNTER — Other Ambulatory Visit: Payer: Self-pay | Admitting: Internal Medicine

## 2017-05-05 ENCOUNTER — Other Ambulatory Visit: Payer: Self-pay | Admitting: Internal Medicine

## 2017-05-15 ENCOUNTER — Other Ambulatory Visit: Payer: Self-pay | Admitting: Internal Medicine

## 2017-05-18 ENCOUNTER — Telehealth: Payer: Self-pay | Admitting: Internal Medicine

## 2017-05-18 NOTE — Telephone Encounter (Signed)
States that she needs her refill for her HYDROcodone-acetaminophen (NORCO/VICODIN) 5-325 MG tablet.  States that we "mail" her Rx to the pharmacy for her and they deliver it to her.  Also states that she would like to see if it can be changed from every 6 hours to every 4-6 hours.

## 2017-05-18 NOTE — Telephone Encounter (Signed)
Please see message below, please advise 

## 2017-05-18 NOTE — Telephone Encounter (Signed)
No change in medication directions Okay to female 1 month supply

## 2017-05-20 MED ORDER — HYDROCODONE-ACETAMINOPHEN 5-325 MG PO TABS: ORAL_TABLET | ORAL | 0 refills | Status: DC

## 2017-05-21 NOTE — Telephone Encounter (Signed)
Rx was placed in out going mail this morning.

## 2017-05-23 ENCOUNTER — Other Ambulatory Visit: Payer: Self-pay | Admitting: Internal Medicine

## 2017-05-25 ENCOUNTER — Other Ambulatory Visit: Payer: Self-pay | Admitting: Internal Medicine

## 2017-06-01 ENCOUNTER — Other Ambulatory Visit: Payer: Self-pay | Admitting: Internal Medicine

## 2017-06-02 NOTE — Telephone Encounter (Signed)
Pt is due for her physical. Needs appointment

## 2017-06-04 NOTE — Telephone Encounter (Signed)
Pt need new Rx for zolpidem   Pharm:  [facility] state that she is out and if it can be called in this morning she will get it delivered to her house before noon.

## 2017-06-05 DIAGNOSIS — H40053 Ocular hypertension, bilateral: Secondary | ICD-10-CM | POA: Diagnosis not present

## 2017-06-19 ENCOUNTER — Telehealth: Payer: Self-pay | Admitting: Internal Medicine

## 2017-06-19 DIAGNOSIS — H40013 Open angle with borderline findings, low risk, bilateral: Secondary | ICD-10-CM | POA: Diagnosis not present

## 2017-06-19 NOTE — Telephone Encounter (Signed)
Left message on voicemail to call office.  

## 2017-06-19 NOTE — Telephone Encounter (Signed)
Pt would like to have a refill on prednisone and the antibiotic that she had previously.  Pharm:  [facility]

## 2017-06-19 NOTE — Progress Notes (Deleted)
Subjective:   Heather [name] is a 82 y.o. female who presents for Medicare Annual (Subsequent) preventive examination.  Review of Systems:  No ROS.  Medicare Wellness Visit. Additional risk factors are reflected in the social history.    Sleep patterns:    Home Safety/Smoke Alarms: Feels safe in home. Smoke alarms in place.  Living environment; residence and Firearm Safety:  [place] Safety/Bike Helmet: Wears seat belt.   Counseling:   Dental-  Female:   Pap-      Aged out, Mammo-      01/22/2007  Negative Dexa scan-    01/10/2000   Osteopenia CCS-  11/17/2013     Objective:     Vitals: There were no vitals taken for this visit.  There is no height or weight on file to calculate BMI.   Tobacco History  Smoking Status  . Never Smoker  Smokeless Tobacco  . Never Used    Comment: smoked rarely in youth     Counseling given: Not Answered   Past Medical History:  Diagnosis Date  . ANXIETY 07/23/2009  . Atherosclerosis of aorta ([place]) 07/24/2015  . Chronic pain   . Complication of anesthesia 7 or 8 yrs ago   woke up during colonscopy  . Coronary atherosclerosis 07/24/2015  . DEPRESSION 07/23/2009  . DIVERTICULOSIS, COLON 07/23/2009  . GERD 07/23/2009  . Headache(784.0) 07/23/2009   occasional  . Hemorrhoids   . HIP PAIN, LEFT 05/16/2010  . History of Crohn's disease   . HYPERLIPIDEMIA 07/23/2009  . HYPERTENSION 07/23/2009  . HYPOTHYROIDISM 07/23/2009  . IBS (irritable bowel syndrome)   . Insomnia   . LOW BACK PAIN, CHRONIC 10/01/2009  . PARESTHESIA 10/01/2009  . Shingles 2006   back   Past Surgical History:  Procedure Laterality Date  . ABDOMINAL HYSTERECTOMY  age 64 or 54  . APPENDECTOMY  yrs ago  . BILATERAL SALPINGOOPHORECTOMY  age 4 or 23  . COLONOSCOPY N/A 11/17/2013   Procedure: COLONOSCOPY;  Surgeon: Cleotis Nipper, MD;  Location: [facility];  Service: Endoscopy;  Laterality: N/A;  . ESOPHAGOGASTRODUODENOSCOPY N/A 11/17/2013   Procedure:  ESOPHAGOGASTRODUODENOSCOPY (EGD);  Surgeon: Cleotis Nipper, MD;  Location: [facility];  Service: Endoscopy;  Laterality: N/A;  . HEMORRHOID SURGERY  yrs ago  . TONSILLECTOMY  yrs ago   Family History  Problem Relation Age of Onset  . Clotting disorder Neg Hx   . COPD Sister   . Cancer Sister        Breast  . Cancer Mother        Unknown type   History  Sexual Activity  . Sexual activity: Not on file    Outpatient Encounter Prescriptions as of 06/26/2017  Medication Sig  . albuterol (PROVENTIL) (2.5 MG/3ML) 0.083% nebulizer solution Take 3 mLs (2.5 mg total) by nebulization every 6 (six) hours as needed for wheezing or shortness of breath.  . benzonatate (TESSALON) 100 MG capsule TAKE 1 CAPSULE TWICE DAILY AS NEEDED FOR COUGH.  . Cholecalciferol 2000 UNITS TABS Take 2,000 Units by mouth daily.  . clorazepate (TRANXENE) 7.5 MG tablet TAKE 1 TABLET TWICE DAILY AS NEEDED FOR ANXIETY.  [name] diltiazem (CARDIZEM CD) 240 MG 24 hr capsule TAKE (1) CAPSULE DAILY.  [name] docusate sodium (COLACE) 100 MG capsule Take 1 capsule (100 mg total) by mouth 2 (two) times daily.  . feeding supplement, ENSURE ENLIVE, (ENSURE ENLIVE) LIQD Take 237 mLs by mouth 2 (two) times daily between meals. (Patient taking differently:  Take 237 mLs by mouth 2 (two) times daily as needed (poor appetite). )  . furosemide (LASIX) 20 MG tablet TAKE 1 TABLET DAILY IF NEEDED FOR SIGNIFICANT LOWER EXTREMITY SWELLING.  [name] HYDROcodone-acetaminophen (NORCO/VICODIN) 5-325 MG tablet TAKE ONE TABLET EVERY 6 HOURS AS NEEDED FOR MODERATE PAIN.  [name] levothyroxine (SYNTHROID, LEVOTHROID) 25 MCG tablet TAKE 1 TABLET DAILY BEFORE BREAKFAST.  [name] NEXIUM 40 MG capsule TAKE 1 CAPSULE DAILY BEFORE BREAKFAST.  [name] Polyethyl Glycol-Propyl Glycol (SYSTANE OP) Place 1 drop into both eyes 2 (two) times daily as needed (for dry eyes).   . polyethylene glycol (MIRALAX / GLYCOLAX) packet Take 17 g by mouth 2 (two) times daily.  [name] PROAIR RESPICLICK 250 (90  Base) MCG/ACT AEPB INHALE 2 PUFFS INTO THE LUNGS EVERY 6 HOURS AS NEEDED FOR WHEEZING ORSHORTNESS OF BREATH.  . promethazine (PHENERGAN) 25 MG tablet TAKE 1 TABLET EVERY 8 HOURS AS NEEDED FOR NAUSEA.  [name] sertraline (ZOLOFT) 50 MG tablet Take 1 tablet (50 mg total) by mouth daily.  [name] SPIRIVA HANDIHALER 18 MCG inhalation capsule INHALE CONTENTS OF 1 CAPSULE DAILY.  . traMADol (ULTRAM) 50 MG tablet TAKE 1 TABLET EVERY SIX HOURS AS NEEDED FOR PAIN.  . traZODone (DESYREL) 50 MG tablet TAKE 1/2 TO 1 TABLET AT BEDTIME AS NEEDED FOR REST.  [name] zolpidem (AMBIEN) 5 MG tablet TAKE 1 TABLET AT BEDTIME AS NEEDED.   No facility-administered encounter medications on file as of 06/26/2017.     Activities of Daily Living No flowsheet data found.  Patient Care Team: [name], MD as PCP - General    Assessment:    Physical assessment deferred to PCP.   Exercise Activities and Dietary recommendations   Diet (meal preparation, eat out, water intake, caffeinated beverages, dairy products, fruits and vegetables): Breakfast: Lunch:  Dinner:      Goals    None     Fall Risk Fall Risk  06/11/2016 01/08/2015 09/28/2013  Falls in the past year? Yes Yes No  Number falls in past yr: 1 1 -  Injury with Fall? Yes No -  Risk for fall due to : Other (Comment) Impaired balance/gait Impaired mobility  Risk for fall due to (comments): slipped out of a chair - -   Depression Screen PHQ 2/9 Scores 06/11/2016 01/08/2015 09/28/2013  PHQ - 2 Score 0 1 0     Cognitive Function        Immunization History  Administered Date(s) Administered  . Influenza Split 09/22/2011, 09/22/2012  . Influenza Whole 10/01/2009, 08/20/2010  . Influenza, High Dose Seasonal PF 08/28/2015, 09/22/2016  . Influenza,inj,Quad PF,36+ Mos 08/25/2014  . Influenza-Unspecified 10/08/2013  . Pneumococcal Conjugate-13 08/25/2014  . Pneumococcal Polysaccharide-23 09/22/2011  . Zoster 11/15/2010   Screening Tests Health  Maintenance  Topic Date Due  . TETANUS/TDAP  08/27/2025 (Originally 06/18/1954)  . INFLUENZA VACCINE  07/08/2017  . DEXA SCAN  Completed  . PNA vac Low Risk Adult  Completed      Plan:   ***   I have personally reviewed and noted the following in the patient's chart:   . Medical and social history . Use of alcohol, tobacco or illicit drugs  . Current medications and supplements . Functional ability and status . Nutritional status . Physical activity . Advanced directives . List of other physicians . Vitals . Screenings to include cognitive, depression, and falls . Referrals and appointments  In addition, I have reviewed and discussed with patient certain preventive protocols, quality metrics, and  best practice recommendations. A written personalized care plan for preventive services as well as general preventive health recommendations were provided to patient.     Ree Edman, RN  06/19/2017

## 2017-06-19 NOTE — Telephone Encounter (Signed)
Patient will need follow-up office visit to determine appropriateness of these medications

## 2017-06-19 NOTE — Progress Notes (Deleted)
Pre visit review using our clinic review tool, if applicable. No additional management support is needed unless otherwise documented below in the visit note. 

## 2017-06-19 NOTE — Telephone Encounter (Signed)
Please advise 

## 2017-06-26 ENCOUNTER — Encounter: Payer: Self-pay | Admitting: Internal Medicine

## 2017-06-26 ENCOUNTER — Ambulatory Visit (INDEPENDENT_AMBULATORY_CARE_PROVIDER_SITE_OTHER): Payer: Medicare Other | Admitting: Internal Medicine

## 2017-06-26 VITALS — BP 166/70 | HR 95 | Temp 98.5°F | Ht 59.0 in | Wt 109.6 lb

## 2017-06-26 DIAGNOSIS — J441 Chronic obstructive pulmonary disease with (acute) exacerbation: Secondary | ICD-10-CM | POA: Diagnosis not present

## 2017-06-26 DIAGNOSIS — J439 Emphysema, unspecified: Secondary | ICD-10-CM | POA: Diagnosis not present

## 2017-06-26 DIAGNOSIS — I2510 Atherosclerotic heart disease of native coronary artery without angina pectoris: Secondary | ICD-10-CM

## 2017-06-26 DIAGNOSIS — I10 Essential (primary) hypertension: Secondary | ICD-10-CM | POA: Diagnosis not present

## 2017-06-26 DIAGNOSIS — J438 Other emphysema: Secondary | ICD-10-CM | POA: Insufficient documentation

## 2017-06-26 MED ORDER — TRAZODONE HCL 50 MG PO TABS: ORAL_TABLET | ORAL | 1 refills | Status: DC

## 2017-06-26 MED ORDER — FUROSEMIDE 20 MG PO TABS: ORAL_TABLET | ORAL | 2 refills | Status: DC

## 2017-06-26 MED ORDER — AZITHROMYCIN 250 MG PO TABS: ORAL_TABLET | ORAL | 0 refills | Status: DC

## 2017-06-26 MED ORDER — HYDROCODONE-ACETAMINOPHEN 5-325 MG PO TABS: ORAL_TABLET | ORAL | 0 refills | Status: DC

## 2017-06-26 MED ORDER — SERTRALINE HCL 50 MG PO TABS: 50.0000 mg | ORAL_TABLET | Freq: Every day | ORAL | 3 refills | Status: DC

## 2017-06-26 MED ORDER — ESOMEPRAZOLE MAGNESIUM 40 MG PO CPDR: DELAYED_RELEASE_CAPSULE | ORAL | 2 refills | Status: DC

## 2017-06-26 MED ORDER — ZOLPIDEM TARTRATE 5 MG PO TABS: 5.0000 mg | ORAL_TABLET | Freq: Every evening | ORAL | 0 refills | Status: DC | PRN

## 2017-06-26 MED ORDER — METHYLPREDNISOLONE ACETATE 80 MG/ML IJ SUSP
80.0000 mg | Freq: Once | INTRAMUSCULAR | Status: AC
  Administered 2017-06-26: 80 mg via INTRAMUSCULAR

## 2017-06-26 MED ORDER — TIOTROPIUM BROMIDE MONOHYDRATE 18 MCG IN CAPS: 1.0000 | ORAL_CAPSULE | Freq: Every day | RESPIRATORY_TRACT | 3 refills | Status: DC

## 2017-06-26 MED ORDER — LEVOTHYROXINE SODIUM 25 MCG PO TABS: 25.0000 ug | ORAL_TABLET | Freq: Every day | ORAL | 1 refills | Status: DC

## 2017-06-26 NOTE — Patient Instructions (Addendum)
Breo 1 inhalation daily   Take over-the-counter expectorants and cough medications such as  Mucinex DM.  Call if there is no improvement in 5 to 7 days or if  you develop worsening cough, fever, or new symptoms, such as shortness of breath or chest pain.  Take your antibiotic as prescribed until ALL of it is gone, but stop if you develop a rash, swelling, or any side effects of the medication.  Contact our office as soon as possible if  there are side effects of the medication.  Hydrate and Humidify  Drink enough water to keep your urine clear or pale yellow. Staying hydrated will help to thin your mucus.  Use a cool mist humidifier to keep the humidity level in your home above 50%.  Inhale steam for 10-15 minutes, 3-4 times a day or as told by your health care provider. You can do this in the bathroom while a hot shower is running.  Limit your exposure to cool or dry air. Rest  Rest as much as possible.

## 2017-06-26 NOTE — Progress Notes (Signed)
Subjective:    Patient ID: Heather [name], female    DOB: 1935/05/07, 82 y.o.   MRN: [id number]  HPI 82 year old patient who has essential hypertension and coronary artery disease.  She also has a history of emphysema. For the past week she has had increasing cough and wheezing.  She is on maintenance medication, which includes a home belies her treatment.  She has been compliant with her inhalational medications.  She has cough which is been minimally productive.  No fever.  Past Medical History:  Diagnosis Date  . ANXIETY 07/23/2009  . Atherosclerosis of aorta ([facility]) 07/24/2015  . Chronic pain   . Complication of anesthesia 7 or 8 yrs ago   woke up during colonscopy  . Coronary atherosclerosis 07/24/2015  . DEPRESSION 07/23/2009  . DIVERTICULOSIS, COLON 07/23/2009  . GERD 07/23/2009  . Headache(784.0) 07/23/2009   occasional  . Hemorrhoids   . HIP PAIN, LEFT 05/16/2010  . History of Crohn's disease   . HYPERLIPIDEMIA 07/23/2009  . HYPERTENSION 07/23/2009  . HYPOTHYROIDISM 07/23/2009  . IBS (irritable bowel syndrome)   . Insomnia   . LOW BACK PAIN, CHRONIC 10/01/2009  . PARESTHESIA 10/01/2009  . Shingles 2006   back     Social History   Social History  . Marital status: Widowed    Spouse name: N/A  . Number of children: 1  . Years of education: N/A   Occupational History  . Not on file.   Social History Main Topics  . Smoking status: Never Smoker  . Smokeless tobacco: Never Used     Comment: smoked rarely in youth  . Alcohol use No  . Drug use: No  . Sexual activity: Not on file   Other Topics Concern  . Not on file   Social History Narrative   Widowed.  Lives alone in her own home.  Ambulates with a cane/walker when needed.    Past Surgical History:  Procedure Laterality Date  . ABDOMINAL HYSTERECTOMY  age 66 or 65  . APPENDECTOMY  yrs ago  . BILATERAL SALPINGOOPHORECTOMY  age 32 or 59  . COLONOSCOPY N/A 11/17/2013   Procedure: COLONOSCOPY;  Surgeon:  Cleotis Nipper, MD;  Location: [facility];  Service: Endoscopy;  Laterality: N/A;  . ESOPHAGOGASTRODUODENOSCOPY N/A 11/17/2013   Procedure: ESOPHAGOGASTRODUODENOSCOPY (EGD);  Surgeon: Cleotis Nipper, MD;  Location: [facility];  Service: Endoscopy;  Laterality: N/A;  . HEMORRHOID SURGERY  yrs ago  . TONSILLECTOMY  yrs ago    Family History  Problem Relation Age of Onset  . Clotting disorder Neg Hx   . COPD Sister   . Cancer Sister        Breast  . Cancer Mother        Unknown type    Allergies  Allergen Reactions  . Tylenol [Acetaminophen] Other (See Comments)    Nightmares  . Symbicort [Budesonide-Formoterol Fumarate] Other (See Comments)    Pt felt like her tongue was swollen, could not swallow  . Duloxetine Other (See Comments)    Patient doesn't recall  . Metronidazole Other (See Comments)    Patient doesn't recall   . Rofecoxib Other (See Comments)    Patient doesn't recall     Current Outpatient Prescriptions on File Prior to Visit  Medication Sig Dispense Refill  . albuterol (PROVENTIL) (2.5 MG/3ML) 0.083% nebulizer solution Take 3 mLs (2.5 mg total) by nebulization every 6 (six) hours as needed for wheezing or shortness of breath. 240 mL 1  .  benzonatate (TESSALON) 100 MG capsule TAKE 1 CAPSULE TWICE DAILY AS NEEDED FOR COUGH. 20 capsule 0  . Cholecalciferol 2000 UNITS TABS Take 2,000 Units by mouth daily.    . clorazepate (TRANXENE) 7.5 MG tablet TAKE 1 TABLET TWICE DAILY AS NEEDED FOR ANXIETY. 60 tablet 0  . diltiazem (CARDIZEM CD) 240 MG 24 hr capsule TAKE (1) CAPSULE DAILY. 90 capsule 0  . docusate sodium (COLACE) 100 MG capsule Take 1 capsule (100 mg total) by mouth 2 (two) times daily. 60 capsule 0  . feeding supplement, ENSURE ENLIVE, (ENSURE ENLIVE) LIQD Take 237 mLs by mouth 2 (two) times daily between meals. (Patient taking differently: Take 237 mLs by mouth 2 (two) times daily as needed (poor appetite). ) 237 mL 12  . Polyethyl Glycol-Propyl Glycol  (SYSTANE OP) Place 1 drop into both eyes 2 (two) times daily as needed (for dry eyes).     . polyethylene glycol (MIRALAX / GLYCOLAX) packet Take 17 g by mouth 2 (two) times daily. 60 each 0  . PROAIR RESPICLICK 329 (90 Base) MCG/ACT AEPB INHALE 2 PUFFS INTO THE LUNGS EVERY 6 HOURS AS NEEDED FOR WHEEZING ORSHORTNESS OF BREATH. 1 each 3  . promethazine (PHENERGAN) 25 MG tablet TAKE 1 TABLET EVERY 8 HOURS AS NEEDED FOR NAUSEA. 20 tablet 0  . traMADol (ULTRAM) 50 MG tablet TAKE 1 TABLET EVERY SIX HOURS AS NEEDED FOR PAIN. 60 tablet 0   No current facility-administered medications on file prior to visit.     BP (!) 166/70 (BP Location: Left Arm, Patient Position: Sitting, Cuff Size: Normal)   Pulse 95   Temp 98.5 F (36.9 C) (Oral)   Ht 4\' 11"  (1.499 m)   Wt 109 lb 9.6 oz (49.7 kg)   SpO2 93%   BMI 22.14 kg/m      Review of Systems  Constitutional: Positive for activity change, appetite change and fatigue. Negative for fever.  HENT: Negative for congestion, dental problem, hearing loss, rhinorrhea, sinus pressure, sore throat and tinnitus.   Eyes: Negative for pain, discharge and visual disturbance.  Respiratory: Positive for cough, shortness of breath and wheezing.   Cardiovascular: Negative for chest pain, palpitations and leg swelling.  Gastrointestinal: Negative for abdominal distention, abdominal pain, blood in stool, constipation, diarrhea, nausea and vomiting.  Genitourinary: Negative for difficulty urinating, dysuria, flank pain, frequency, hematuria, pelvic pain, urgency, vaginal bleeding, vaginal discharge and vaginal pain.  Musculoskeletal: Negative for arthralgias, gait problem and joint swelling.  Skin: Negative for rash.  Neurological: Positive for weakness. Negative for dizziness, syncope, speech difficulty, numbness and headaches.  Hematological: Negative for adenopathy.  Psychiatric/Behavioral: Negative for agitation, behavioral problems and dysphoric mood. The patient  is not nervous/anxious.        Objective:   Physical Exam  Constitutional: She is oriented to person, place, and time. She appears well-developed and well-nourished. No distress.   Anxious No distress No increased work of breathing  HENT:  Head: Normocephalic.  Right Ear: External ear normal.  Left Ear: External ear normal.  Mouth/Throat: Oropharynx is clear and moist.  Eyes: Pupils are equal, round, and reactive to light. Conjunctivae and EOM are normal.  Neck: Normal range of motion. Neck supple. No thyromegaly present.  Cardiovascular: Normal rate, regular rhythm, normal heart sounds and intact distal pulses.   Pulmonary/Chest: Effort normal. No respiratory distress. She has wheezes.  Audible expiratory wheezing No tachypnea  Abdominal: Soft. Bowel sounds are normal. She exhibits no mass. There is no tenderness.  Musculoskeletal:  Normal range of motion.  Lymphadenopathy:    She has no cervical adenopathy.  Neurological: She is alert and oriented to person, place, and time.  Skin: Skin is warm and dry. No rash noted.  Psychiatric: She has a normal mood and affect. Her behavior is normal.          Assessment & Plan:   COPD exacerbation Will treat with Depo-Medrol expectorants.  Continue home albuterol nebulizer treatments.  Will treat with azithromycin  Essential hypertension, stable.  Systolic elevation Hypothyroidism Low back pain.  Analgesics refilled  Follow-up 3 months   Jayron Maqueda Pilar Plate

## 2017-07-07 ENCOUNTER — Other Ambulatory Visit: Payer: Self-pay | Admitting: Internal Medicine

## 2017-07-08 ENCOUNTER — Other Ambulatory Visit: Payer: Self-pay | Admitting: Internal Medicine

## 2017-07-09 ENCOUNTER — Other Ambulatory Visit: Payer: Self-pay | Admitting: Internal Medicine

## 2017-07-20 ENCOUNTER — Other Ambulatory Visit: Payer: Self-pay | Admitting: Internal Medicine

## 2017-07-24 ENCOUNTER — Telehealth: Payer: Self-pay | Admitting: Internal Medicine

## 2017-07-24 MED ORDER — HYDROCODONE-ACETAMINOPHEN 5-325 MG PO TABS: ORAL_TABLET | ORAL | 0 refills | Status: DC

## 2017-07-24 NOTE — Telephone Encounter (Signed)
Rx printed and signed and was mailed to: [address] [place], [place] [id number]

## 2017-07-24 NOTE — Telephone Encounter (Signed)
Pt needs [place] to mail rx hydrocodone to [facility]. Pt has copd and does not drive

## 2017-07-31 ENCOUNTER — Ambulatory Visit (INDEPENDENT_AMBULATORY_CARE_PROVIDER_SITE_OTHER): Payer: Medicare Other

## 2017-07-31 ENCOUNTER — Ambulatory Visit: Payer: Medicare Other | Admitting: Family Medicine

## 2017-07-31 ENCOUNTER — Other Ambulatory Visit: Payer: Self-pay | Admitting: Internal Medicine

## 2017-07-31 VITALS — BP 110/60 | HR 63 | Ht 59.0 in | Wt 110.0 lb

## 2017-07-31 DIAGNOSIS — Z0000 Encounter for general adult medical examination without abnormal findings: Secondary | ICD-10-CM

## 2017-07-31 DIAGNOSIS — Z23 Encounter for immunization: Secondary | ICD-10-CM

## 2017-07-31 MED ORDER — HYDROCODONE-ACETAMINOPHEN 5-325 MG PO TABS: ORAL_TABLET | ORAL | 0 refills | Status: DC

## 2017-07-31 NOTE — Patient Instructions (Addendum)
Heather [name] , Thank you for taking time to come for your Medicare Wellness Visit. I appreciate your ongoing commitment to your health goals. Please review the following plan we discussed and let me know if I can assist you in the future.   Drink a lot of fluids/ water Eat a couple of prunes daily    You had your high dose flu vaccine today     These are the goals we discussed: Goals    . Gain weight          Keep weight stable around 110  Eat snacks through out the day to keep you healthy        This is a list of the screening recommended for you and due dates:  Health Maintenance  Topic Date Due  . Flu Shot  07/08/2017  . Tetanus Vaccine  08/27/2025*  . DEXA scan (bone density measurement)  Completed  . Pneumonia vaccines  Completed  *Topic was postponed. The date shown is not the original due date.    Health Maintenance for Postmenopausal Women Menopause is a normal process in which your reproductive ability comes to an end. This process happens gradually over a span of months to years, usually between the ages of 97 and 47. Menopause is complete when you have missed 12 consecutive menstrual periods. It is important to talk with your health care provider about some of the most common conditions that affect postmenopausal women, such as heart disease, cancer, and bone loss (osteoporosis). Adopting a healthy lifestyle and getting preventive care can help to promote your health and wellness. Those actions can also lower your chances of developing some of these common conditions. What should I know about menopause? During menopause, you may experience a number of symptoms, such as:  Moderate-to-severe hot flashes.  Night sweats.  Decrease in sex drive.  Mood swings.  Headaches.  Tiredness.  Irritability.  Memory problems.  Insomnia.  Choosing to treat or not to treat menopausal changes is an individual decision that you make with your health care provider. What  should I know about hormone replacement therapy and supplements? Hormone therapy products are effective for treating symptoms that are associated with menopause, such as hot flashes and night sweats. Hormone replacement carries certain risks, especially as you become older. If you are thinking about using estrogen or estrogen with progestin treatments, discuss the benefits and risks with your health care provider. What should I know about heart disease and stroke? Heart disease, heart attack, and stroke become more likely as you age. This may be due, in part, to the hormonal changes that your body experiences during menopause. These can affect how your body processes dietary fats, triglycerides, and cholesterol. Heart attack and stroke are both medical emergencies. There are many things that you can do to help prevent heart disease and stroke:  Have your blood pressure checked at least every 1-2 years. High blood pressure causes heart disease and increases the risk of stroke.  If you are 52-20 years old, ask your health care provider if you should take aspirin to prevent a heart attack or a stroke.  Do not use any tobacco products, including cigarettes, chewing tobacco, or electronic cigarettes. If you need help quitting, ask your health care provider.  It is important to eat a healthy diet and maintain a healthy weight. ? Be sure to include plenty of vegetables, fruits, low-fat dairy products, and lean protein. ? Avoid eating foods that are high in solid fats,  added sugars, or salt (sodium).  Get regular exercise. This is one of the most important things that you can do for your health. ? Try to exercise for at least 150 minutes each week. The type of exercise that you do should increase your heart rate and make you sweat. This is known as moderate-intensity exercise. ? Try to do strengthening exercises at least twice each week. Do these in addition to the moderate-intensity exercise.  Know your  numbers.Ask your health care provider to check your cholesterol and your blood glucose. Continue to have your blood tested as directed by your health care provider.  What should I know about cancer screening? There are several types of cancer. Take the following steps to reduce your risk and to catch any cancer development as early as possible. Breast Cancer  Practice breast self-awareness. ? This means understanding how your breasts normally appear and feel. ? It also means doing regular breast self-exams. Let your health care provider know about any changes, no matter how small.  If you are 41 or older, have a clinician do a breast exam (clinical breast exam or CBE) every year. Depending on your age, family history, and medical history, it may be recommended that you also have a yearly breast X-ray (mammogram).  If you have a family history of breast cancer, talk with your health care provider about genetic screening.  If you are at high risk for breast cancer, talk with your health care provider about having an MRI and a mammogram every year.  Breast cancer (BRCA) gene test is recommended for women who have family members with BRCA-related cancers. Results of the assessment will determine the need for genetic counseling and BRCA1 and for BRCA2 testing. BRCA-related cancers include these types: ? Breast. This occurs in males or females. ? Ovarian. ? Tubal. This may also be called fallopian tube cancer. ? Cancer of the abdominal or pelvic lining (peritoneal cancer). ? Prostate. ? Pancreatic.  Cervical, Uterine, and Ovarian Cancer Your health care provider may recommend that you be screened regularly for cancer of the pelvic organs. These include your ovaries, uterus, and vagina. This screening involves a pelvic exam, which includes checking for microscopic changes to the surface of your cervix (Pap test).  For women ages 21-65, health care providers may recommend a pelvic exam and a Pap  test every three years. For women ages 55-65, they may recommend the Pap test and pelvic exam, combined with testing for human papilloma virus (HPV), every five years. Some types of HPV increase your risk of cervical cancer. Testing for HPV may also be done on women of any age who have unclear Pap test results.  Other health care providers may not recommend any screening for nonpregnant women who are considered low risk for pelvic cancer and have no symptoms. Ask your health care provider if a screening pelvic exam is right for you.  If you have had past treatment for cervical cancer or a condition that could lead to cancer, you need Pap tests and screening for cancer for at least 20 years after your treatment. If Pap tests have been discontinued for you, your risk factors (such as having a new sexual partner) need to be reassessed to determine if you should start having screenings again. Some women have medical problems that increase the chance of getting cervical cancer. In these cases, your health care provider may recommend that you have screening and Pap tests more often.  If you have a family  history of uterine cancer or ovarian cancer, talk with your health care provider about genetic screening.  If you have vaginal bleeding after reaching menopause, tell your health care provider.  There are currently no reliable tests available to screen for ovarian cancer.  Lung Cancer Lung cancer screening is recommended for adults 75-77 years old who are at high risk for lung cancer because of a history of smoking. A yearly low-dose CT scan of the lungs is recommended if you:  Currently smoke.  Have a history of at least 30 pack-years of smoking and you currently smoke or have quit within the past 15 years. A pack-year is smoking an average of one pack of cigarettes per day for one year.  Yearly screening should:  Continue until it has been 15 years since you quit.  Stop if you develop a health  problem that would prevent you from having lung cancer treatment.  Colorectal Cancer  This type of cancer can be detected and can often be prevented.  Routine colorectal cancer screening usually begins at age 40 and continues through age 34.  If you have risk factors for colon cancer, your health care provider may recommend that you be screened at an earlier age.  If you have a family history of colorectal cancer, talk with your health care provider about genetic screening.  Your health care provider may also recommend using home test kits to check for hidden blood in your stool.  A small camera at the end of a tube can be used to examine your colon directly (sigmoidoscopy or colonoscopy). This is done to check for the earliest forms of colorectal cancer.  Direct examination of the colon should be repeated every 5-10 years until age 38. However, if early forms of precancerous polyps or small growths are found or if you have a family history or genetic risk for colorectal cancer, you may need to be screened more often.  Skin Cancer  Check your skin from head to toe regularly.  Monitor any moles. Be sure to tell your health care provider: ? About any new moles or changes in moles, especially if there is a change in a mole's shape or color. ? If you have a mole that is larger than the size of a pencil eraser.  If any of your family members has a history of skin cancer, especially at a young age, talk with your health care provider about genetic screening.  Always use sunscreen. Apply sunscreen liberally and repeatedly throughout the day.  Whenever you are outside, protect yourself by wearing long sleeves, pants, a wide-brimmed hat, and sunglasses.  What should I know about osteoporosis? Osteoporosis is a condition in which bone destruction happens more quickly than new bone creation. After menopause, you may be at an increased risk for osteoporosis. To help prevent osteoporosis or the  bone fractures that can happen because of osteoporosis, the following is recommended:  If you are 75-23 years old, get at least 1,000 mg of calcium and at least 600 mg of vitamin D per day.  If you are older than age 58 but younger than age 56, get at least 1,200 mg of calcium and at least 600 mg of vitamin D per day.  If you are older than age 45, get at least 1,200 mg of calcium and at least 800 mg of vitamin D per day.  Smoking and excessive alcohol intake increase the risk of osteoporosis. Eat foods that are rich in calcium and vitamin D,  and do weight-bearing exercises several times each week as directed by your health care provider. What should I know about how menopause affects my mental health? Depression may occur at any age, but it is more common as you become older. Common symptoms of depression include:  Low or sad mood.  Changes in sleep patterns.  Changes in appetite or eating patterns.  Feeling an overall lack of motivation or enjoyment of activities that you previously enjoyed.  Frequent crying spells.  Talk with your health care provider if you think that you are experiencing depression. What should I know about immunizations? It is important that you get and maintain your immunizations. These include:  Tetanus, diphtheria, and pertussis (Tdap) booster vaccine.  Influenza every year before the flu season begins.  Pneumonia vaccine.  Shingles vaccine.  Your health care provider may also recommend other immunizations. This information is not intended to replace advice given to you by your health care provider. Make sure you discuss any questions you have with your health care provider. Document Released: 01/16/2006 Document Revised: 06/13/2016 Document Reviewed: 08/28/2015 Elsevier Interactive Patient Education  2018 [place] in the Home Falls can cause injuries and can affect people from all age groups. There are many simple things  that you can do to make your home safe and to help prevent falls. What can I do on the outside of my home?  Regularly repair the edges of walkways and driveways and fix any cracks.  Remove high doorway thresholds.  Trim any shrubbery on the main path into your home.  Use bright outdoor lighting.  Clear walkways of debris and clutter, including tools and rocks.  Regularly check that handrails are securely fastened and in good repair. Both sides of any steps should have handrails.  Install guardrails along the edges of any raised decks or porches.  Have leaves, snow, and ice cleared regularly.  Use sand or salt on walkways during winter months.  In the garage, clean up any spills right away, including grease or oil spills. What can I do in the bathroom?  Use night lights.  Install grab bars by the toilet and in the tub and shower. Do not use towel bars as grab bars.  Use non-skid mats or decals on the floor of the tub or shower.  If you need to sit down while you are in the shower, use a plastic, non-slip stool.  Keep the floor dry. Immediately clean up any water that spills on the floor.  Remove soap buildup in the tub or shower on a regular basis.  Attach bath mats securely with double-sided non-slip rug tape.  Remove throw rugs and other tripping hazards from the floor. What can I do in the bedroom?  Use night lights.  Make sure that a bedside light is easy to reach.  Do not use oversized bedding that drapes onto the floor.  Have a firm chair that has side arms to use for getting dressed.  Remove throw rugs and other tripping hazards from the floor. What can I do in the kitchen?  Clean up any spills right away.  Avoid walking on wet floors.  Place frequently used items in easy-to-reach places.  If you need to reach for something above you, use a sturdy step stool that has a grab bar.  Keep electrical cables out of the way.  Do not use floor polish or wax  that makes floors slippery. If you have to use wax,  make sure that it is non-skid floor wax.  Remove throw rugs and other tripping hazards from the floor. What can I do in the stairways?  Do not leave any items on the stairs.  Make sure that there are handrails on both sides of the stairs. Fix handrails that are broken or loose. Make sure that handrails are as long as the stairways.  Check any carpeting to make sure that it is firmly attached to the stairs. Fix any carpet that is loose or worn.  Avoid having throw rugs at the top or bottom of stairways, or secure the rugs with carpet tape to prevent them from moving.  Make sure that you have a light switch at the top of the stairs and the bottom of the stairs. If you do not have them, have them installed. What are some other fall prevention tips?  Wear closed-toe shoes that fit well and support your feet. Wear shoes that have rubber soles or low heels.  When you use a stepladder, make sure that it is completely opened and that the sides are firmly locked. Have someone hold the ladder while you are using it. Do not climb a closed stepladder.  Add color or contrast paint or tape to grab bars and handrails in your home. Place contrasting color strips on the first and last steps.  Use mobility aids as needed, such as canes, walkers, scooters, and crutches.  Turn on lights if it is dark. Replace any light bulbs that burn out.  Set up furniture so that there are clear paths. Keep the furniture in the same spot.  Fix any uneven floor surfaces.  Choose a carpet design that does not hide the edge of steps of a stairway.  Be aware of any and all pets.  Review your medicines with your healthcare provider. Some medicines can cause dizziness or changes in blood pressure, which increase your risk of falling. Talk with your health care provider about other ways that you can decrease your risk of falls. This may include working with a physical  therapist or trainer to improve your strength, balance, and endurance. This information is not intended to replace advice given to you by your health care provider. Make sure you discuss any questions you have with your health care provider. Document Released: 11/14/2002 Document Revised: 04/22/2016 Document Reviewed: 12/29/2014 Elsevier Interactive Patient Education  2017 [employer].   About Constipation  Constipation Overview Constipation is the most common gastrointestinal complaint - about 4 million Americans experience constipation and make 2.5 million physician visits a year to get help for the problem.  Constipation can occur when the colon absorbs too much water, the colon's muscle contraction is slow or sluggish, and/or there is delayed transit time through the colon.  The result is stool that is hard and dry.  Indicators of constipation include straining during bowel movements greater than 25% of the time, having fewer than three bowel movements per week, and/or the feeling of incomplete evacuation.  There are established guidelines ([place] II ) for defining constipation. A person needs to have two or more of the following symptoms for at least 12 weeks (not necessarily consecutive) in the preceding 12 months: . Straining in  greater than 25% of bowel movements . Lumpy or hard stools in greater than 25% of bowel movements . Sensation of incomplete emptying in greater than 25% of bowel movements . Sensation of anorectal obstruction/blockade in greater than 25% of bowel movements . Manual maneuvers to help  empty greater than 25% of bowel movements (e.g., digital evacuation, support of the pelvic floor)  . Less than  3 bowel movements/week . Loose stools are not present, and criteria for irritable bowel syndrome are insufficient  Common Causes of Constipation . Lack of fiber in your diet . Lack of physical activity . Medications, including iron and calcium supplements  . Dairy  intake . Dehydration . Abuse of laxatives  Travel  Irritable Bowel Syndrome  Pregnancy  Luteal phase of menstruation (after ovulation and before menses)  Colorectal problems  Intestinal Dysfunction  Treating Constipation  There are several ways of treating constipation, including changes to diet and exercise, use of laxatives, adjustments to the pelvic floor, and scheduled toileting.  These treatments include: . increasing fiber and fluids in the diet  . increasing physical activity . learning muscle coordination   learning proper toileting techniques and toileting modifications   designing and sticking  to a toileting schedule     2007, [employer] Doc.22

## 2017-07-31 NOTE — Progress Notes (Addendum)
Subjective:   Heather [name] is a 82 y.o. female who presents for Medicare Annual (Subsequent) preventive examination.  The Patient was informed that the wellness visit is to identify future health risk and educate and initiate measures that can reduce risk for increased disease through the lifespan.    Annual Wellness Assessment  Reports health as slow Dx copd x 1 year ago.,states she is feeling much better and looks well today.  Preventive Screening -Counseling & Management  Medicare Annual Preventive Care Visit - Subsequent Last OV -06/2017 Colonoscopy 12/14 Last mammogram 01/2007 Dexa; 01/2000- -2.1     VS reviewed;   Diet  Eats x 2 per day Cooks very little  Have meal delivered at lunch x 5 Cook on the weekends   BMI 22; was down to 103 but now eating better and eating ice cream   Exercise  Getting oob and taking care of self Does do exercise in her bed/ does pull up in bed Medical chair and bathroom and has grab bars and does not leave the home by herself  Risk for falls; no falls this year States she has alarm on wrist band; Medical alert  Feels very safe in her home Back yard is fenced    Dental - good for now  Stressors: Ok for now     Cardiac Risk Factors Addressed Hyperlipidemia - chol/hdl ratio is 3 Glucose elevated but has been on steriod therapy    Advanced Directives completed  Patient Care Team: [name], MD as PCP - General Assessed for additional providers  Immunization History  Administered Date(s) Administered  . Influenza Split 09/22/2011, 09/22/2012  . Influenza Whole 10/01/2009, 08/20/2010  . Influenza, High Dose Seasonal PF 08/28/2015, 09/22/2016  . Influenza,inj,Quad PF,6+ Mos 08/25/2014  . Influenza-Unspecified 10/08/2013  . Pneumococcal Conjugate-13 08/25/2014  . Pneumococcal Polysaccharide-23 09/22/2011  . Zoster 11/15/2010   Required Immunizations needed today  Screening test up to date or reviewed  for plan of completion Health Maintenance Due  Topic Date Due  . INFLUENZA VACCINE  07/08/2017   Given today    Cardiac Risk Factors include: advanced age (>72men, >49 women);family history of premature cardiovascular disease;hypertension     Objective:     Vitals: BP 110/60   Pulse 63   Ht 4\' 11"  (1.499 m)   Wt 110 lb (49.9 kg)   SpO2 97%   BMI 22.22 kg/m   Body mass index is 22.22 kg/m.   Tobacco History  Smoking Status  . Never Smoker  Smokeless Tobacco  . Never Used    Comment: smoked rarely in youth     Counseling given: Yes   Past Medical History:  Diagnosis Date  . ANXIETY 07/23/2009  . Atherosclerosis of aorta ([place]) 07/24/2015  . Chronic pain   . Complication of anesthesia 7 or 8 yrs ago   woke up during colonscopy  . Coronary atherosclerosis 07/24/2015  . DEPRESSION 07/23/2009  . DIVERTICULOSIS, COLON 07/23/2009  . GERD 07/23/2009  . Headache(784.0) 07/23/2009   occasional  . Hemorrhoids   . HIP PAIN, LEFT 05/16/2010  . History of Crohn's disease   . HYPERLIPIDEMIA 07/23/2009  . HYPERTENSION 07/23/2009  . HYPOTHYROIDISM 07/23/2009  . IBS (irritable bowel syndrome)   . Insomnia   . LOW BACK PAIN, CHRONIC 10/01/2009  . PARESTHESIA 10/01/2009  . Shingles 2006   back   Past Surgical History:  Procedure Laterality Date  . ABDOMINAL HYSTERECTOMY  age 31 or 58  . APPENDECTOMY  yrs ago  . BILATERAL SALPINGOOPHORECTOMY  age 43 or 11  . COLONOSCOPY N/A 11/17/2013   Procedure: COLONOSCOPY;  Surgeon: Cleotis Nipper, MD;  Location: [facility];  Service: Endoscopy;  Laterality: N/A;  . ESOPHAGOGASTRODUODENOSCOPY N/A 11/17/2013   Procedure: ESOPHAGOGASTRODUODENOSCOPY (EGD);  Surgeon: Cleotis Nipper, MD;  Location: [facility];  Service: Endoscopy;  Laterality: N/A;  . HEMORRHOID SURGERY  yrs ago  . TONSILLECTOMY  yrs ago   Family History  Problem Relation Age of Onset  . Cancer Mother        Unknown type  . COPD Sister   . Cancer Sister         Breast  . Clotting disorder Neg Hx    History  Sexual Activity  . Sexual activity: Not on file    Outpatient Encounter Prescriptions as of 07/31/2017  Medication Sig  . albuterol (PROVENTIL) (2.5 MG/3ML) 0.083% nebulizer solution Take 3 mLs (2.5 mg total) by nebulization every 6 (six) hours as needed for wheezing or shortness of breath.  . benzonatate (TESSALON) 100 MG capsule TAKE 1 CAPSULE TWICE DAILY AS NEEDED FOR COUGH.  . Cholecalciferol 2000 UNITS TABS Take 2,000 Units by mouth daily.  [name] diltiazem (CARDIZEM CD) 240 MG 24 hr capsule TAKE (1) CAPSULE DAILY.  [name] docusate sodium (COLACE) 100 MG capsule Take 1 capsule (100 mg total) by mouth 2 (two) times daily.  [name] esomeprazole (NEXIUM) 40 MG capsule TAKE 1 CAPSULE DAILY BEFORE BREAKFAST.  . feeding supplement, ENSURE ENLIVE, (ENSURE ENLIVE) LIQD Take 237 mLs by mouth 2 (two) times daily between meals. (Patient taking differently: Take 237 mLs by mouth 2 (two) times daily as needed (poor appetite). )  . furosemide (LASIX) 20 MG tablet TAKE 1 TABLET DAILY IF NEEDED FOR SIGNIFICANT LOWER EXTREMITY SWELLING.  [name] levothyroxine (SYNTHROID, LEVOTHROID) 25 MCG tablet Take 1 tablet (25 mcg total) by mouth daily before breakfast.  . Polyethyl Glycol-Propyl Glycol (SYSTANE OP) Place 1 drop into both eyes 2 (two) times daily as needed (for dry eyes).   . polyethylene glycol (MIRALAX / GLYCOLAX) packet Take 17 g by mouth 2 (two) times daily.  [name] PROAIR RESPICLICK 992 (90 Base) MCG/ACT AEPB INHALE 2 PUFFS INTO THE LUNGS EVERY 6 HOURS AS NEEDED FOR WHEEZING ORSHORTNESS OF BREATH.  . promethazine (PHENERGAN) 25 MG tablet TAKE 1 TABLET EVERY 8 HOURS AS NEEDED FOR NAUSEA.  [name] sertraline (ZOLOFT) 50 MG tablet Take 1 tablet (50 mg total) by mouth daily.  [name] tiotropium (SPIRIVA HANDIHALER) 18 MCG inhalation capsule Place 1 capsule (18 mcg total) into inhaler and inhale daily.  . traZODone (DESYREL) 50 MG tablet TAKE 1/2 TO 1 TABLET AT BEDTIME AS NEEDED FOR REST.  [name]  zolpidem (AMBIEN) 5 MG tablet Take 1 tablet (5 mg total) by mouth at bedtime as needed.  . [DISCONTINUED] HYDROcodone-acetaminophen (NORCO/VICODIN) 5-325 MG tablet TAKE ONE TABLET EVERY 6 HOURS AS NEEDED FOR MODERATE PAIN.  . clorazepate (TRANXENE) 7.5 MG tablet TAKE 1 TABLET TWICE DAILY AS NEEDED FOR ANXIETY.  . traMADol (ULTRAM) 50 MG tablet TAKE 1 TABLET EVERY SIX HOURS AS NEEDED FOR PAIN. (Patient not taking: Reported on 07/31/2017)  . [DISCONTINUED] azithromycin (ZITHROMAX) 250 MG tablet 2 tablets once daily for 3 consecutive days   No facility-administered encounter medications on file as of 07/31/2017.     Activities of Daily Living In your present state of health, do you have any difficulty performing the following activities: 07/31/2017  Hearing? N  Vision? Y  Comment a  little panicked over glaucoma; will educate but eye doctors are monitoring   Difficulty concentrating or making decisions? N  Walking or climbing stairs? N  Dressing or bathing? N  Doing errands, shopping? N  Preparing Food and eating ? N  Using the Toilet? N  In the past six months, have you accidently leaked urine? N  Do you have problems with loss of bowel control? Y  Comment constipation  Managing your Medications? N  Managing your Finances? N  Housekeeping or managing your Housekeeping? N  Some recent data might be hidden    Patient Care Team: [name], MD as PCP - General    Assessment:     Exercise Activities and Dietary recommendations Current Exercise Habits: Home exercise routine, Intensity: Mild, Exercise limited by: respiratory conditions(s)  Goals    . Gain weight          Keep weight stable around 110  Eat snacks through out the day to keep you healthy       Fall Risk Fall Risk  07/31/2017 06/11/2016 01/08/2015 09/28/2013  Falls in the past year? Yes Yes Yes No  Number falls in past yr: 1 1 1  -  Injury with Fall? Yes Yes No -  Risk for fall due to : - Other (Comment)  Impaired balance/gait Impaired mobility  Risk for fall due to: Comment - slipped out of a chair - -   Depression Screen PHQ 2/9 Scores 07/31/2017 07/31/2017 06/11/2016 01/08/2015  PHQ - 2 Score 0 0 0 1     Cognitive Function     6CIT Screen 07/31/2017  What Year? 0 points  What month? 0 points  What time? 0 points  Count back from 20 0 points  Months in reverse 0 points  Repeat phrase 0 points  Total Score 0    Immunization History  Administered Date(s) Administered  . Influenza Split 09/22/2011, 09/22/2012  . Influenza Whole 10/01/2009, 08/20/2010  . Influenza, High Dose Seasonal PF 08/28/2015, 09/22/2016  . Influenza,inj,Quad PF,6+ Mos 08/25/2014  . Influenza-Unspecified 10/08/2013  . Pneumococcal Conjugate-13 08/25/2014  . Pneumococcal Polysaccharide-23 09/22/2011  . Zoster 11/15/2010   Screening Tests Health Maintenance  Topic Date Due  . INFLUENZA VACCINE  07/08/2017  . TETANUS/TDAP  08/27/2025 (Originally 06/18/1954)  . DEXA SCAN  Completed  . PNA vac Low Risk Adult  Completed      Plan:     PCP Notes   Health Maintenance Patient agreed to take her high does flu vaccine today    Abnormal Screens no abnormal screens  Referrals  No referrals  Patient concerns; Needs hydrocodone refilled; called to [facility] and confirmed mailed rx on 8/17 was never rec'd. No refills since 7/20 and Rx re-written by Dr. Raliegh Ip and the patient left with px today  The patient also requested another Breo inhaler and was given another sample today as she stated it did help her. The patient was able to take several deep breaths today without difficulty. O2 sats were 97  Tonight, the patient is not taking her tramadol. States she did not feel this helped her at all.  The patient also stated the doctor told her she had borderline glaucoma. The doctor is following.  The patient complained of intermittent constipation. States friends help but she has not followed any. Recommended she  drink plenty of water and get up and walk around more during the day. Given information on constipation.  Nurse Concerns; As noted  Next PCP apt The  patient verbalized understanding that she is due an appointment with Dr. Raliegh Ip in October and she will make the appointment.      I have personally reviewed and noted the following in the patient's chart:   . Medical and social history . Use of alcohol, tobacco or illicit drugs  . Current medications and supplements . Functional ability and status . Nutritional status . Physical activity . Advanced directives . List of other physicians . Hospitalizations, surgeries, and ER visits in previous 12 months . Vitals . Screenings to include cognitive, depression, and falls . Referrals and appointments  In addition, I have reviewed and discussed with patient certain preventive protocols, quality metrics, and best practice recommendations. A written personalized care plan for preventive services as well as general preventive health recommendations were provided to patient.     Wynetta Fines, RN  07/31/2017  Patient's note from annual Medicare wellness visit, reviewed and agree with findings  [name]

## 2017-08-04 ENCOUNTER — Telehealth: Payer: Self-pay | Admitting: Internal Medicine

## 2017-08-04 NOTE — Telephone Encounter (Signed)
° °  Pt said she would like a call back concerning the below med. She was given a sample and would like a call back. Pt said Manuela Schwartz gave her the med   [facility] [id number]   [phone]

## 2017-08-04 NOTE — Telephone Encounter (Signed)
° ° °  Heather [name] pt said she would for you to call her concerning her visit with you on Fruday 07/31/17

## 2017-08-05 NOTE — Telephone Encounter (Signed)
Ms. Walsworth called;  No answer; left vm  To call back for questions [facility] RN

## 2017-08-05 NOTE — Telephone Encounter (Signed)
Dr. Raliegh Ip Ms. Hornbeck was here for AWV on Friday the 24th She requested the replacement for breo given as an sample when she was here 7/20   Patty pulled the sample which she now states Breo 25/200 vs the Breo 25/100 that you gave her. Documentation is for breo sample  Can she use the 25/200 until her next apt in October. She states she has tolerated well.  Please advise

## 2017-08-05 NOTE — Telephone Encounter (Signed)
Yes, ok to use 25/200. PK

## 2017-08-07 ENCOUNTER — Telehealth: Payer: Self-pay

## 2017-08-07 NOTE — Telephone Encounter (Signed)
Heather [name] was called back to take her 25/200  shauck rn

## 2017-08-16 ENCOUNTER — Other Ambulatory Visit: Payer: Self-pay | Admitting: Internal Medicine

## 2017-08-19 ENCOUNTER — Other Ambulatory Visit: Payer: Self-pay | Admitting: Internal Medicine

## 2017-09-07 ENCOUNTER — Other Ambulatory Visit: Payer: Self-pay | Admitting: Internal Medicine

## 2017-09-28 ENCOUNTER — Encounter: Payer: Self-pay | Admitting: Internal Medicine

## 2017-09-28 ENCOUNTER — Ambulatory Visit (INDEPENDENT_AMBULATORY_CARE_PROVIDER_SITE_OTHER): Payer: Medicare Other | Admitting: Internal Medicine

## 2017-09-28 VITALS — BP 158/68 | HR 85 | Temp 98.2°F | Ht 59.0 in | Wt 115.0 lb

## 2017-09-28 DIAGNOSIS — M545 Low back pain, unspecified: Secondary | ICD-10-CM

## 2017-09-28 DIAGNOSIS — G8929 Other chronic pain: Secondary | ICD-10-CM | POA: Diagnosis not present

## 2017-09-28 DIAGNOSIS — M25552 Pain in left hip: Secondary | ICD-10-CM | POA: Diagnosis not present

## 2017-09-28 DIAGNOSIS — I2510 Atherosclerotic heart disease of native coronary artery without angina pectoris: Secondary | ICD-10-CM | POA: Diagnosis not present

## 2017-09-28 DIAGNOSIS — I10 Essential (primary) hypertension: Secondary | ICD-10-CM

## 2017-09-28 MED ORDER — SERTRALINE HCL 50 MG PO TABS
50.0000 mg | ORAL_TABLET | Freq: Every day | ORAL | 3 refills | Status: DC
Start: 2017-09-28 — End: 2018-12-03

## 2017-09-28 MED ORDER — ZOLPIDEM TARTRATE 5 MG PO TABS: 5.0000 mg | ORAL_TABLET | Freq: Every evening | ORAL | 2 refills | Status: DC | PRN

## 2017-09-28 MED ORDER — HYDROCODONE-ACETAMINOPHEN 5-325 MG PO TABS: ORAL_TABLET | ORAL | 0 refills | Status: DC

## 2017-09-28 MED ORDER — METHYLPREDNISOLONE ACETATE 80 MG/ML IJ SUSP
80.0000 mg | Freq: Once | INTRAMUSCULAR | Status: AC
  Administered 2017-09-28: 80 mg via INTRAMUSCULAR

## 2017-09-28 MED ORDER — CLORAZEPATE DIPOTASSIUM 7.5 MG PO TABS: ORAL_TABLET | ORAL | 0 refills | Status: DC

## 2017-09-28 NOTE — Progress Notes (Signed)
   Subjective:    Patient ID: Heather [name], female    DOB: 12/05/1935, 82 y.o.   MRN: [id number]  HPI  82 year old patient who is seen today for pain management evaluation per opioid protocol  .Jenera printed, initialed  and scanned into the EMR. New controlled drug contract reviewed and signed  Indication for chronic opioid: patient has chronic low back pain.  This has been refractory to tramadol.  She uses hydrocodone maximum dose 3 per day Medication and dose: hydrocodone 5-acetaminophen 325 mg every 8 hours as needed for pain # pills per month: 90 Last UDS date: 09/28/2017 Pain contract signed (Y/N): yes Date narcotic database last reviewed (include red flags): 09/28/2017  Review of Systems     Objective:   Physical Exam        Assessment & Plan:    Encounter for chronic pain management (G89.29) Narcotic use  (711.90) Pain management contract signed (Z02.89)  [name]

## 2017-09-28 NOTE — Patient Instructions (Signed)
Limit your sodium (Salt) intake  Please check your blood pressure on a regular basis.  If it is consistently greater than 150/90, please make an office appointment.  Call or return to clinic prn if these symptoms worsen or fail to improve as anticipated.

## 2017-09-28 NOTE — Addendum Note (Signed)
Addended by: Abelardo Diesel on: 09/28/2017 04:58 PM   Modules accepted: Orders

## 2017-10-07 ENCOUNTER — Other Ambulatory Visit: Payer: Self-pay | Admitting: Internal Medicine

## 2017-10-21 ENCOUNTER — Other Ambulatory Visit: Payer: Self-pay | Admitting: Internal Medicine

## 2017-10-26 ENCOUNTER — Other Ambulatory Visit: Payer: Self-pay | Admitting: Internal Medicine

## 2017-11-01 ENCOUNTER — Encounter: Payer: Self-pay | Admitting: Emergency Medicine

## 2017-11-01 ENCOUNTER — Other Ambulatory Visit: Payer: Self-pay

## 2017-11-01 ENCOUNTER — Emergency Department (INDEPENDENT_AMBULATORY_CARE_PROVIDER_SITE_OTHER)
Admission: EM | Admit: 2017-11-01 | Discharge: 2017-11-01 | Disposition: A | Discharge status: Home / Self Care | Payer: Medicare Other | Source: Home / Self Care | Attending: Family Medicine | Admitting: Family Medicine

## 2017-11-01 DIAGNOSIS — J069 Acute upper respiratory infection, unspecified: Secondary | ICD-10-CM | POA: Diagnosis not present

## 2017-11-01 DIAGNOSIS — J441 Chronic obstructive pulmonary disease with (acute) exacerbation: Secondary | ICD-10-CM

## 2017-11-01 DIAGNOSIS — B9789 Other viral agents as the cause of diseases classified elsewhere: Secondary | ICD-10-CM | POA: Diagnosis not present

## 2017-11-01 MED ORDER — DOXYCYCLINE HYCLATE 100 MG PO CAPS: 100.0000 mg | ORAL_CAPSULE | Freq: Two times a day (BID) | ORAL | 0 refills | Status: DC

## 2017-11-01 MED ORDER — PREDNISONE 20 MG PO TABS: ORAL_TABLET | ORAL | 0 refills | Status: DC

## 2017-11-01 MED ORDER — METHYLPREDNISOLONE SODIUM SUCC 125 MG IJ SOLR
80.0000 mg | Freq: Once | INTRAMUSCULAR | Status: AC
  Administered 2017-11-01: 80 mg via INTRAMUSCULAR

## 2017-11-01 NOTE — Discharge Instructions (Signed)
Begin prednisone Monday 11/02/17. Take plain guaifenesin (1200mg  extended release tabs such as Mucinex) twice daily, with plenty of water, for cough and congestion.  Get adequate rest.   May use Afrin nasal spray (or generic oxymetazoline) each morning for about 5 days and then discontinue.  Also recommend using saline nasal spray several times daily and saline nasal irrigation (AYR is a common brand).  Use Flonase nasal spray each morning after using Afrin nasal spray and saline nasal irrigation. Try warm salt water gargles for sore throat.  Stop all antihistamines for now, and other non-prescription cough/cold preparations. May take Delsym Cough Suppressant at bedtime for nighttime cough.  Continue all inhalers as prescribed. If symptoms become significantly worse during the night or over the weekend, proceed to the local emergency room.

## 2017-11-01 NOTE — ED Provider Notes (Signed)
Heather [name] CARE    CSN: [id number] Arrival date & time: 11/01/17  1145     History   Chief Complaint Chief Complaint  Patient presents with  . Otalgia    HPI Heather [name] is a 82 y.o. female.   Patient complains of 3 day history of sore throat and left earache.  She has COPD and developed a dry cough with wheezing and a sensation of shortness of breath.  She has had to increase her use of her albuterol inhaler.  She has had chills and myalgias.       Past Medical History:  Diagnosis Date  . ANXIETY 07/23/2009  . Atherosclerosis of aorta ([place]) 07/24/2015  . Chronic pain   . Complication of anesthesia 7 or 8 yrs ago   woke up during colonscopy  . Coronary atherosclerosis 07/24/2015  . DEPRESSION 07/23/2009  . DIVERTICULOSIS, COLON 07/23/2009  . GERD 07/23/2009  . Headache(784.0) 07/23/2009   occasional  . Hemorrhoids   . HIP PAIN, LEFT 05/16/2010  . History of Crohn's disease   . HYPERLIPIDEMIA 07/23/2009  . HYPERTENSION 07/23/2009  . HYPOTHYROIDISM 07/23/2009  . IBS (irritable bowel syndrome)   . Insomnia   . LOW BACK PAIN, CHRONIC 10/01/2009  . PARESTHESIA 10/01/2009  . Shingles 2006   back    Patient Active Problem List   Diagnosis Date Noted  . 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 COPD exacerbation ([place]) 05/29/2016  . Hypoxia 05/29/2016  . Coronary atherosclerosis 07/24/2015  . Atherosclerosis of aorta ([place]) 07/24/2015  . Dysphagia 07/24/2015  . Acute respiratory failure with hypoxia ([place]) 07/23/2015  . Bronchospasm with bronchitis, acute 07/23/2015  . HIP PAIN, LEFT 05/16/2010  . LOW BACK PAIN, CHRONIC 10/01/2009  . PARESTHESIA 10/01/2009  . Hypothyroidism 07/23/2009  . Dyslipidemia 07/23/2009  . Anxiety state 07/23/2009  . Anxiety and depression 07/23/2009  . Essential hypertension 07/23/2009  . GERD 07/23/2009  . DIVERTICULOSIS, COLON 07/23/2009  . HEADACHE 07/23/2009    Past Surgical History:  Procedure Laterality Date  .  ABDOMINAL HYSTERECTOMY  age 53 or 68  . APPENDECTOMY  yrs ago  . BILATERAL SALPINGOOPHORECTOMY  age 14 or 57  . COLONOSCOPY N/A 11/17/2013   Procedure: COLONOSCOPY;  Surgeon: Cleotis Nipper, MD;  Location: [facility];  Service: Endoscopy;  Laterality: N/A;  . ESOPHAGOGASTRODUODENOSCOPY N/A 11/17/2013   Procedure: ESOPHAGOGASTRODUODENOSCOPY (EGD);  Surgeon: Cleotis Nipper, MD;  Location: [facility];  Service: Endoscopy;  Laterality: N/A;  . HEMORRHOID SURGERY  yrs ago  . TONSILLECTOMY  yrs ago    OB History    No data available       Home Medications    Prior to Admission medications   Medication Sig Start Date End Date Taking? Authorizing Provider  albuterol (PROVENTIL) (2.5 MG/3ML) 0.083% nebulizer solution INHALE CONTENTS OF 1 AMP VIA NEBULIZER EVERY 6 HRS AS NEEDED FOR WHEEZING/ SHORTNESS OF BREATH. 10/07/17   [name], MD  benzonatate (TESSALON) 100 MG capsule TAKE 1 CAPSULE TWICE DAILY AS NEEDED FOR COUGH. 07/20/17   [name], MD  Cholecalciferol 2000 UNITS TABS Take 2,000 Units by mouth daily.    [provider]  clorazepate (TRANXENE) 7.5 MG tablet TAKE 1 TABLET TWICE DAILY AS NEEDED FOR ANXIETY. 10/26/17   [name], MD  diltiazem (CARDIZEM CD) 240 MG 24 hr capsule TAKE (1) CAPSULE DAILY. 10/26/17   [name], MD  docusate sodium (COLACE) 100 MG capsule Take 1 capsule (100 mg total)  by mouth 2 (two) times daily. 06/01/16   Bonnielee Haff, MD  doxycycline (VIBRAMYCIN) 100 MG capsule Take 1 capsule (100 mg total) by mouth 2 (two) times daily. Take with food. 11/01/17   Kandra Nicolas, MD  esomeprazole (NEXIUM) 40 MG capsule TAKE 1 CAPSULE DAILY BEFORE BREAKFAST. 06/26/17   [name], MD  feeding supplement, ENSURE ENLIVE, (ENSURE ENLIVE) LIQD Take 237 mLs by mouth 2 (two) times daily between meals. Patient taking differently: Take 237 mLs by mouth 2 (two) times daily as needed (poor appetite).  07/25/15    Robbie Lis, MD  furosemide (LASIX) 20 MG tablet TAKE 1 TABLET DAILY IF NEEDED FOR SIGNIFICANT LOWER EXTREMITY SWELLING. 06/26/17   [name], MD  HYDROcodone-acetaminophen (NORCO/VICODIN) 5-325 MG tablet TAKE ONE TABLET EVERY 6 HOURS AS NEEDED FOR MODERATE PAIN. 09/28/17   [name], MD  levothyroxine (SYNTHROID, LEVOTHROID) 25 MCG tablet Take 1 tablet (25 mcg total) by mouth daily before breakfast. 06/26/17   [name], MD  Polyethyl Glycol-Propyl Glycol (SYSTANE OP) Place 1 drop into both eyes 2 (two) times daily as needed (for dry eyes).     [provider]  polyethylene glycol (MIRALAX / GLYCOLAX) packet Take 17 g by mouth 2 (two) times daily. 06/01/16   Bonnielee Haff, MD  predniSONE (DELTASONE) 20 MG tablet Take one tab by mouth twice daily for 4 days, then one daily for 3 days. Take with food. 11/01/17   Kandra Nicolas, MD  PROAIR RESPICLICK 147 [phone] Base) MCG/ACT AEPB INHALE 2 PUFFS INTO THE LUNGS EVERY 6 HOURS AS NEEDED FOR WHEEZING ORSHORTNESS OF BREATH. 10/07/17   [name], MD  promethazine (PHENERGAN) 25 MG tablet TAKE 1 TABLET EVERY 8 HOURS AS NEEDED FOR NAUSEA. 09/08/17   [name], MD  promethazine (PHENERGAN) 25 MG tablet TAKE 1 TABLET EVERY 8 HOURS AS NEEDED FOR NAUSEA. 10/21/17   [name], MD  sertraline (ZOLOFT) 50 MG tablet Take 1 tablet (50 mg total) by mouth daily. 09/28/17   [name], MD  tiotropium (SPIRIVA HANDIHALER) 18 MCG inhalation capsule Place 1 capsule (18 mcg total) into inhaler and inhale daily. 06/26/17   [name], MD  traZODone (DESYREL) 50 MG tablet TAKE 1/2 TO 1 TABLET AT BEDTIME AS NEEDED FOR REST. 06/26/17   [name], MD  zolpidem (AMBIEN) 5 MG tablet Take 1 tablet (5 mg total) by mouth at bedtime as needed. 09/28/17   [name], MD    Family History Family History  Problem Relation Age of Onset  . Cancer Mother        Unknown type  .  COPD Sister   . Cancer Sister        Breast  . Clotting disorder Neg Hx     Social History Social History   Tobacco Use  . Smoking status: Never Smoker  . Smokeless tobacco: Never Used  . Tobacco comment: smoked rarely in youth  Substance Use Topics  . Alcohol use: No    Alcohol/week: 0.0 oz  . Drug use: No     Allergies   Tylenol [acetaminophen]; Symbicort [budesonide-formoterol fumarate]; Duloxetine; Metronidazole; and Rofecoxib   Review of Systems Review of Systems + sore throat + cough No pleuritic pain + wheezing + nasal congestion + post-nasal drainage No sinus pain/pressure No itchy/red eyes + earache No hemoptysis + SOB No fever, + chills No nausea No vomiting No abdominal pain No diarrhea No  urinary symptoms No skin rash + fatigue + myalgias No headache Used OTC meds without relief   Physical Exam Triage Vital Signs ED Triage Vitals [11/01/17 1215]  Enc Vitals Group     BP (!) 170/86     Pulse Rate 67     Resp      Temp 98.6 F (37 C)     Temp Source Oral     SpO2 96 %     Weight 110 lb (49.9 kg)     Height 4\' 11"  (1.499 m)     Head Circumference      Peak Flow      Pain Score 10     Pain Loc      Pain Edu?      Excl. in [place]?    No data found.  Updated Vital Signs BP (!) 170/86 (BP Location: Left Arm) Comment: 172/85  Pulse 67   Temp 98.6 F (37 C) (Oral)   Ht 4\' 11"  (1.499 m)   Wt 110 lb (49.9 kg)   SpO2 96%   BMI 22.22 kg/m   Visual Acuity Right Eye Distance:   Left Eye Distance:   Bilateral Distance:    Right Eye Near:   Left Eye Near:    Bilateral Near:     Physical Exam Nursing notes and Vital Signs reviewed. Appearance:  Patient appears stated age, and in no acute distress Eyes:  Pupils are equal, round, and reactive to light and accomodation.  Extraocular movement is intact.  Conjunctivae are not inflamed  Ears:  Canals normal.  Tympanic membranes normal.  Nose:  Mildly congested turbinates.  No sinus  tenderness.   Pharynx:  Normal Neck:  Supple.  Enlarged posterior/lateral nodes are palpated bilaterally, tender to palpation on the left.   Lungs:   Faint bilateral posterior expiratory wheezes heard.  Breath sounds are equal.  Moving air well. Heart:  Regular rate and rhythm without murmurs, rubs, or gallops.  Abdomen:  Nontender without masses or hepatosplenomegaly.  Bowel sounds are present.  No CVA or flank tenderness.  Extremities:  No edema.  Skin:  No rash present.    UC Treatments / Results  Labs (all labs ordered are listed, but only abnormal results are displayed) Labs Reviewed - No data to display  EKG  EKG Interpretation None       Radiology No results found.  Procedures Procedures (including critical care time)  Medications Ordered in UC Medications  methylPREDNISolone sodium succinate (SOLU-MEDROL) 125 mg/2 mL injection 80 mg (not administered)     Initial Impression / Assessment and Plan / UC Course  I have reviewed the triage vital signs and the nursing notes.  Pertinent labs & imaging results that were available during my care of the patient were reviewed by me and considered in my medical decision making (see chart for details).    Administered Solumedrol 80mg  IM Begin empiric doxycycline 100mg  BID Begin prednisone burst/taper Monday 11/02/17. Take plain guaifenesin (1200mg  extended release tabs such as Mucinex) twice daily, with plenty of water, for cough and congestion.  Get adequate rest.   May use Afrin nasal spray (or generic oxymetazoline) each morning for about 5 days and then discontinue.  Also recommend using saline nasal spray several times daily and saline nasal irrigation (AYR is a common brand).  Use Flonase nasal spray each morning after using Afrin nasal spray and saline nasal irrigation. Try warm salt water gargles for sore throat.  Stop all antihistamines for now,  and other non-prescription cough/cold preparations. May take Delsym  Cough Suppressant at bedtime for nighttime cough.  Continue all inhalers as prescribed. If symptoms become significantly worse during the night or over the weekend, proceed to the local emergency room.  Followup with Family Doctor if not improved in one week.     Final Clinical Impressions(s) / UC Diagnoses   Final diagnoses:  Viral URI with cough  COPD with exacerbation [facility])    ED Discharge Orders        Ordered    predniSONE (DELTASONE) 20 MG tablet     11/01/17 1343    doxycycline (VIBRAMYCIN) 100 MG capsule  2 times daily     11/01/17 1343           Kandra Nicolas, MD 11/02/17 1410

## 2017-11-01 NOTE — ED Triage Notes (Signed)
Left Sided Ear Pain

## 2017-11-03 ENCOUNTER — Telehealth: Payer: Self-pay

## 2017-11-03 NOTE — Telephone Encounter (Signed)
Feeling better.  Ear pain is relieved.  Will follow up as needed.

## 2017-11-21 ENCOUNTER — Other Ambulatory Visit: Payer: Self-pay | Admitting: Internal Medicine

## 2017-12-07 ENCOUNTER — Other Ambulatory Visit: Payer: Self-pay | Admitting: Internal Medicine

## 2017-12-09 ENCOUNTER — Other Ambulatory Visit: Payer: Self-pay | Admitting: Internal Medicine

## 2017-12-10 NOTE — Telephone Encounter (Signed)
Copied from [place] [phone]. Topic: Quick Communication - See Telephone Encounter >> Dec 10, 2017 10:23 AM Bea Graff, NT wrote: CRM for notification. See Telephone encounter for: [facility] calling to get the refill of clorazepate (TRANXENE). They state the rx didn't got through to them yesterday. She is a 11am delivery today. CB# [phone] [facility]. Medication was denied yesterday.  12/10/17.

## 2017-12-10 NOTE — Telephone Encounter (Signed)
I called in script this morning. Pharmacy did not get script from yesterday.

## 2017-12-16 ENCOUNTER — Other Ambulatory Visit: Payer: Self-pay | Admitting: Internal Medicine

## 2017-12-17 ENCOUNTER — Other Ambulatory Visit: Payer: Self-pay | Admitting: Internal Medicine

## 2017-12-17 ENCOUNTER — Other Ambulatory Visit: Payer: Self-pay | Admitting: *Deleted

## 2017-12-17 MED ORDER — ALBUTEROL SULFATE 108 (90 BASE) MCG/ACT IN AEPB: 2.0000 | INHALATION_SPRAY | Freq: Four times a day (QID) | RESPIRATORY_TRACT | 0 refills | Status: DC | PRN

## 2017-12-17 MED ORDER — ALBUTEROL SULFATE (2.5 MG/3ML) 0.083% IN NEBU: INHALATION_SOLUTION | RESPIRATORY_TRACT | 0 refills | Status: DC

## 2017-12-17 NOTE — Telephone Encounter (Signed)
Rx pending for office review- has allergy alert

## 2017-12-17 NOTE — Telephone Encounter (Signed)
Copied from [place] [phone]. Topic: Quick Communication - Rx Refill/Question >> Dec 17, 2017 10:48 AM Antonieta Iba C wrote: Self.   Refill for  albuterol - pt says that she will be out. Pt says that the pharmacy will be delivering in 10 minutes. Pt says that she would like to know if we could fill as soon as possible? Pt says if not she will not receive until next week and she will be completely out by then.   Preferred Pharmacy (with phone number or street name): [place], [place].   Agent: Please be advised that RX refills may take up to 3 business days. We ask that you follow-up with your pharmacy.

## 2017-12-17 NOTE — Telephone Encounter (Signed)
Sent to the pharmacy by e-scribe. 

## 2018-01-05 ENCOUNTER — Other Ambulatory Visit: Payer: Self-pay | Admitting: Internal Medicine

## 2018-01-05 NOTE — Telephone Encounter (Signed)
Medication was last refilled on 12/10/17. Too early to refill medication.

## 2018-01-07 ENCOUNTER — Other Ambulatory Visit: Payer: Self-pay | Admitting: Internal Medicine

## 2018-01-08 ENCOUNTER — Other Ambulatory Visit: Payer: Self-pay | Admitting: Internal Medicine

## 2018-01-08 MED ORDER — CLORAZEPATE DIPOTASSIUM 7.5 MG PO TABS: 7.5000 mg | ORAL_TABLET | Freq: Two times a day (BID) | ORAL | 0 refills | Status: DC | PRN

## 2018-01-08 NOTE — Telephone Encounter (Signed)
Pt was called and informed that a face to face pain management appointment is needed to refill her medication. Pt verbalized understanding. The below letter was mailed to pt's home address.   "Dear patients, The Strengthen Opioid Misuse Prevention (STOP) Act of 2017 has been signed into law to fight the opioid problem that has had a major impact in [place] and the [place].  [facility] wants to be sure to follow the law while we continue to provide you with the exceptional care you have come to expect.   For most of you, nothing will change.  Those of you who get a regular long-term pain management prescription from one of our providers will need to schedule a separate pain management appointment.  At this appointment, your provider will talk you through the changes we have had to begin because of the STOP Act.  It's the law.  What this means for you is that now you will need to have a separate pain management visit with your provider at least every 3 months.  You will also need to sign an updated controlled substance contract that will be discussed with you in detail during your visit.   We know this change can be confusing and uncomfortable, but we are here to help you every step of the way.  Please contact us today to set up your pain management appointment.    Sincerely,   [facility] "

## 2018-01-15 ENCOUNTER — Other Ambulatory Visit: Payer: Self-pay | Admitting: Internal Medicine

## 2018-01-18 ENCOUNTER — Other Ambulatory Visit: Payer: Self-pay | Admitting: Internal Medicine

## 2018-01-19 NOTE — Telephone Encounter (Signed)
Okay for refill #60 

## 2018-01-19 NOTE — Telephone Encounter (Signed)
PT is requesting a refill on Tramadol... Medication not in chart. Please advise.

## 2018-01-20 NOTE — Telephone Encounter (Signed)
Caller name: Tye [place]  Relation to pt: [employer] back number: [phone]  Pharmacy:  [place], [place] [phone] (Phone) [phone] (Fax)     Reason for call:  Pharmacy checking on the status of message below, pharmacy requesting verbal order, please advise

## 2018-01-26 ENCOUNTER — Ambulatory Visit (INDEPENDENT_AMBULATORY_CARE_PROVIDER_SITE_OTHER): Payer: Medicare Other | Admitting: Internal Medicine

## 2018-01-26 ENCOUNTER — Encounter: Payer: Self-pay | Admitting: Internal Medicine

## 2018-01-26 VITALS — BP 122/62 | HR 79 | Temp 98.5°F | Ht 59.0 in | Wt 111.0 lb

## 2018-01-26 DIAGNOSIS — I2510 Atherosclerotic heart disease of native coronary artery without angina pectoris: Secondary | ICD-10-CM

## 2018-01-26 DIAGNOSIS — I10 Essential (primary) hypertension: Secondary | ICD-10-CM

## 2018-01-26 DIAGNOSIS — E039 Hypothyroidism, unspecified: Secondary | ICD-10-CM

## 2018-01-26 DIAGNOSIS — E785 Hyperlipidemia, unspecified: Secondary | ICD-10-CM | POA: Diagnosis not present

## 2018-01-26 DIAGNOSIS — J431 Panlobular emphysema: Secondary | ICD-10-CM

## 2018-01-26 DIAGNOSIS — M545 Low back pain, unspecified: Secondary | ICD-10-CM

## 2018-01-26 DIAGNOSIS — G8929 Other chronic pain: Secondary | ICD-10-CM

## 2018-01-26 LAB — CBC WITH DIFFERENTIAL/PLATELET
Basophils Absolute: 0.1 10*3/uL (ref 0.0–0.1)
Basophils Relative: 0.7 % (ref 0.0–3.0)
Eosinophils Absolute: 0.3 10*3/uL (ref 0.0–0.7)
Eosinophils Relative: 3.9 % (ref 0.0–5.0)
HCT: 35.6 % — ABNORMAL LOW (ref 36.0–46.0)
Hemoglobin: 11.4 g/dL — ABNORMAL LOW (ref 12.0–15.0)
Lymphocytes Relative: 23 % (ref 12.0–46.0)
Lymphs Abs: 1.8 10*3/uL (ref 0.7–4.0)
MCHC: 32.1 g/dL (ref 30.0–36.0)
MCV: 88.1 fl (ref 78.0–100.0)
Monocytes Absolute: 0.7 10*3/uL (ref 0.1–1.0)
Monocytes Relative: 8.5 % (ref 3.0–12.0)
Neutro Abs: 5.1 10*3/uL (ref 1.4–7.7)
Neutrophils Relative %: 63.9 % (ref 43.0–77.0)
Platelets: 441 10*3/uL — ABNORMAL HIGH (ref 150.0–400.0)
RBC: 4.05 Mil/uL (ref 3.87–5.11)
RDW: 15.3 % (ref 11.5–15.5)
WBC: 8 10*3/uL (ref 4.0–10.5)

## 2018-01-26 LAB — COMPREHENSIVE METABOLIC PANEL
ALT: 8 U/L (ref 0–35)
AST: 14 U/L (ref 0–37)
Albumin: 4.1 g/dL (ref 3.5–5.2)
Alkaline Phosphatase: 87 U/L (ref 39–117)
BUN: 8 mg/dL (ref 6–23)
CO2: 34 mEq/L — ABNORMAL HIGH (ref 19–32)
Calcium: 9.7 mg/dL (ref 8.4–10.5)
Chloride: 100 mEq/L (ref 96–112)
Creatinine, Ser: 0.77 mg/dL (ref 0.40–1.20)
GFR: 76.54 mL/min (ref >60.00)
Glucose, Bld: 105 mg/dL — ABNORMAL HIGH (ref 70–99)
Potassium: 4.2 mEq/L (ref 3.5–5.1)
Sodium: 140 mEq/L (ref 135–145)
Total Bilirubin: 0.5 mg/dL (ref 0.2–1.2)
Total Protein: 6.7 g/dL (ref 6.0–8.3)

## 2018-01-26 LAB — LIPID PANEL
Cholesterol: 234 mg/dL — ABNORMAL HIGH (ref 0–200)
HDL: 81.9 mg/dL (ref >39.00)
LDL Cholesterol: 130 mg/dL — ABNORMAL HIGH (ref 0–99)
NonHDL: 151.95
Total CHOL/HDL Ratio: 3
Triglycerides: 112 mg/dL (ref 0.0–149.0)
VLDL: 22.4 mg/dL (ref 0.0–40.0)

## 2018-01-26 LAB — TSH: TSH: 1.11 u[IU]/mL (ref 0.35–4.50)

## 2018-01-26 MED ORDER — TIOTROPIUM BROMIDE MONOHYDRATE 18 MCG IN CAPS
1.0000 | ORAL_CAPSULE | Freq: Every day | RESPIRATORY_TRACT | 3 refills | Status: DC
Start: 2018-01-26 — End: 2018-04-30

## 2018-01-26 MED ORDER — ALBUTEROL SULFATE 108 (90 BASE) MCG/ACT IN AEPB: 2.0000 | INHALATION_SPRAY | Freq: Four times a day (QID) | RESPIRATORY_TRACT | 6 refills | Status: DC | PRN

## 2018-01-26 MED ORDER — TRAMADOL HCL 50 MG PO TABS: 50.0000 mg | ORAL_TABLET | Freq: Four times a day (QID) | ORAL | 0 refills | Status: DC | PRN

## 2018-01-26 NOTE — Patient Instructions (Addendum)
Limit your sodium (Salt) intake  Avoids foods high in acid such as tomatoes citrus juices, and spicy foods.  Avoid eating within two hours of lying down or before exercising.  Do not overheat.  Try smaller more frequent meals.   Return in 3-6 months for follow-up

## 2018-01-26 NOTE — Progress Notes (Signed)
Subjective:    Patient ID: Heather [name], female    DOB: Apr 11, 1935, 83 y.o.   MRN: [id number]  HPI 83 year old patient who is seen today in follow-up.  She has a history of essential hypertension and coronary artery disease. She has COPD and is on maintenance Spiriva and requires frequent albuterol use.  She has arthritis and chronic back and left hip pain. She has a history of anxiety disorder. She has been on hydrocodone in the past but presently has been treated with tramadol. No history of recent falls  Past Medical History:  Diagnosis Date  . ANXIETY 07/23/2009  . Atherosclerosis of aorta ([place]) 07/24/2015  . Chronic pain   . Complication of anesthesia 7 or 8 yrs ago   woke up during colonscopy  . Coronary atherosclerosis 07/24/2015  . DEPRESSION 07/23/2009  . DIVERTICULOSIS, COLON 07/23/2009  . GERD 07/23/2009  . Headache(784.0) 07/23/2009   occasional  . Hemorrhoids   . HIP PAIN, LEFT 05/16/2010  . History of Crohn's disease   . HYPERLIPIDEMIA 07/23/2009  . HYPERTENSION 07/23/2009  . HYPOTHYROIDISM 07/23/2009  . IBS (irritable bowel syndrome)   . Insomnia   . LOW BACK PAIN, CHRONIC 10/01/2009  . PARESTHESIA 10/01/2009  . Shingles 2006   back     Social History   Socioeconomic History  . Marital status: Widowed    Spouse name: Not on file  . Number of children: 1  . Years of education: Not on file  . Highest education level: Not on file  Social Needs  . Financial resource strain: Not on file  . Food insecurity - worry: Not on file  . Food insecurity - inability: Not on file  . Transportation needs - medical: Not on file  . Transportation needs - non-medical: Not on file  Occupational History  . Not on file  Tobacco Use  . Smoking status: Never Smoker  . Smokeless tobacco: Never Used  . Tobacco comment: smoked rarely in youth  Substance and Sexual Activity  . Alcohol use: No    Alcohol/week: 0.0 oz  . Drug use: No  . Sexual activity: Not on file  Other  Topics Concern  . Not on file  Social History Narrative   Widowed.  Lives alone in her own home.  Ambulates with a cane/walker when needed.    Past Surgical History:  Procedure Laterality Date  . ABDOMINAL HYSTERECTOMY  age 30 or 61  . APPENDECTOMY  yrs ago  . BILATERAL SALPINGOOPHORECTOMY  age 33 or 44  . COLONOSCOPY N/A 11/17/2013   Procedure: COLONOSCOPY;  Surgeon: Cleotis Nipper, MD;  Location: [facility];  Service: Endoscopy;  Laterality: N/A;  . ESOPHAGOGASTRODUODENOSCOPY N/A 11/17/2013   Procedure: ESOPHAGOGASTRODUODENOSCOPY (EGD);  Surgeon: Cleotis Nipper, MD;  Location: [facility];  Service: Endoscopy;  Laterality: N/A;  . HEMORRHOID SURGERY  yrs ago  . TONSILLECTOMY  yrs ago    Family History  Problem Relation Age of Onset  . Cancer Mother        Unknown type  . COPD Sister   . Cancer Sister        Breast  . Clotting disorder Neg Hx     Allergies  Allergen Reactions  . Tylenol [Acetaminophen] Other (See Comments)    Nightmares  . Symbicort [Budesonide-Formoterol Fumarate] Other (See Comments)    Pt felt like her tongue was swollen, could not swallow  . Duloxetine Other (See Comments)    Patient doesn't recall  . Metronidazole Other (  See Comments)    Patient doesn't recall   . Rofecoxib Other (See Comments)    Patient doesn't recall     Current Outpatient Medications on File Prior to Visit  Medication Sig Dispense Refill  . albuterol (PROVENTIL) (2.5 MG/3ML) 0.083% nebulizer solution INHALE CONTENTS OF 1 AMP VIA NEBULIZER EVERY 6 HRS AS NEEDED FOR WHEEZING/ SHORTNESS OF BREATH. 225 mL 0  . Cholecalciferol 2000 UNITS TABS Take 2,000 Units by mouth daily.    . clorazepate (TRANXENE) 7.5 MG tablet Take 1 tablet (7.5 mg total) by mouth 2 (two) times daily as needed for anxiety. 60 tablet 0  . diltiazem (CARDIZEM CD) 240 MG 24 hr capsule TAKE (1) CAPSULE DAILY. 90 capsule 0  . docusate sodium (COLACE) 100 MG capsule Take 1 capsule (100 mg total) by mouth  2 (two) times daily. 60 capsule 0  . esomeprazole (NEXIUM) 40 MG capsule TAKE 1 CAPSULE DAILY BEFORE BREAKFAST. 90 capsule 2  . feeding supplement, ENSURE ENLIVE, (ENSURE ENLIVE) LIQD Take 237 mLs by mouth 2 (two) times daily between meals. (Patient taking differently: Take 237 mLs by mouth 2 (two) times daily as needed (poor appetite). ) 237 mL 12  . furosemide (LASIX) 20 MG tablet TAKE 1 TABLET DAILY IF NEEDED FOR SIGNIFICANT LOWER EXTREMITY SWELLING. 90 tablet 2  . HYDROcodone-acetaminophen (NORCO/VICODIN) 5-325 MG tablet TAKE ONE TABLET EVERY 6 HOURS AS NEEDED FOR MODERATE PAIN. 90 tablet 0  . levothyroxine (SYNTHROID, LEVOTHROID) 25 MCG tablet Take 1 tablet (25 mcg total) by mouth daily before breakfast. 90 tablet 1  . Polyethyl Glycol-Propyl Glycol (SYSTANE OP) Place 1 drop into both eyes 2 (two) times daily as needed (for dry eyes).     . polyethylene glycol (MIRALAX / GLYCOLAX) packet Take 17 g by mouth 2 (two) times daily. 60 each 0  . PROAIR RESPICLICK 597 (90 Base) MCG/ACT AEPB INHALE 2 PUFFS INTO THE LUNGS EVERY 6 HOURS AS NEEDED FOR WHEEZING ORSHORTNESS OF BREATH. 1 each 6  . sertraline (ZOLOFT) 50 MG tablet Take 1 tablet (50 mg total) by mouth daily. 90 tablet 3  . tiotropium (SPIRIVA HANDIHALER) 18 MCG inhalation capsule Place 1 capsule (18 mcg total) into inhaler and inhale daily. 90 capsule 3  . traMADol (ULTRAM) 50 MG tablet TAKE 1 TABLET EVERY SIX HOURS AS NEEDED FOR PAIN. 60 tablet 0  . traZODone (DESYREL) 50 MG tablet TAKE 1/2 TO 1 TABLET AT BEDTIME AS NEEDED FOR REST. 90 tablet 0   No current facility-administered medications on file prior to visit.     BP 122/62 (BP Location: Left Arm, Patient Position: Sitting, Cuff Size: Normal)   Pulse 79   Temp 98.5 F (36.9 C) (Oral)   Ht 4\' 11"  (1.499 m)   Wt 111 lb (50.3 kg)   SpO2 91%   BMI 22.42 kg/m      Review of Systems  Constitutional: Positive for activity change, appetite change and fatigue.  HENT: Positive for  congestion and ear pain.   Respiratory: Positive for cough, shortness of breath and wheezing.   Musculoskeletal: Positive for arthralgias, back pain and gait problem.       Objective:   Physical Exam  Constitutional: She is oriented to person, place, and time. She appears well-developed and well-nourished.  Elderly Frail Blood pressure low normal Weight 111 Walks with a cane  HENT:  Head: Normocephalic.  Right Ear: External ear normal.  Left Ear: External ear normal.  Mouth/Throat: Oropharynx is clear and moist.  Eyes:  Conjunctivae and EOM are normal. Pupils are equal, round, and reactive to light.  Neck: Normal range of motion. Neck supple. No thyromegaly present.  Cardiovascular: Normal rate, regular rhythm, normal heart sounds and intact distal pulses.  Pulmonary/Chest: Effort normal.  No increased work of breathing Scattered coarse rhonchi diffusely No tachypnea or tachycardia O2 saturation 91% (cold room)  Abdominal: Soft. Bowel sounds are normal. She exhibits no mass. There is no tenderness.  Musculoskeletal: Normal range of motion.  Lymphadenopathy:    She has no cervical adenopathy.  Neurological: She is alert and oriented to person, place, and time.  Skin: Skin is warm and dry. No rash noted.  Psychiatric: She has a normal mood and affect. Her behavior is normal.          Assessment & Plan:   COPD.  Medications updated Osteoarthritis Chronic back and left hip pain.  Analgesics refilled Essential hypertension well-controlled  We will update lab Medications updated  [name]

## 2018-01-26 NOTE — Progress Notes (Signed)
   Subjective:    Patient ID: Heather [name], female    DOB: [birth year], 83 y.o.   MRN: [id number]  HPI  Wt Readings from Last 3 Encounters:  01/26/18 111 lb (50.3 kg)  11/01/17 110 lb (49.9 kg)  09/28/17 115 lb (52.2 kg)    Review of Systems     Objective:   Physical Exam        Assessment & Plan:

## 2018-01-27 ENCOUNTER — Telehealth: Payer: Self-pay | Admitting: Internal Medicine

## 2018-01-27 DIAGNOSIS — F329 Major depressive disorder, single episode, unspecified: Secondary | ICD-10-CM | POA: Diagnosis not present

## 2018-01-27 DIAGNOSIS — J439 Emphysema, unspecified: Secondary | ICD-10-CM | POA: Diagnosis not present

## 2018-01-27 DIAGNOSIS — I2510 Atherosclerotic heart disease of native coronary artery without angina pectoris: Secondary | ICD-10-CM | POA: Diagnosis not present

## 2018-01-27 DIAGNOSIS — I10 Essential (primary) hypertension: Secondary | ICD-10-CM | POA: Diagnosis not present

## 2018-01-27 DIAGNOSIS — M545 Low back pain: Secondary | ICD-10-CM | POA: Diagnosis not present

## 2018-01-27 DIAGNOSIS — M6281 Muscle weakness (generalized): Secondary | ICD-10-CM | POA: Diagnosis not present

## 2018-01-27 NOTE — Telephone Encounter (Signed)
Okay for verbal orders as requested?

## 2018-01-27 NOTE — Telephone Encounter (Signed)
Copied from [place] [phone]. Topic: General - Other >> Jan 27, 2018  1:46 PM Darl Householder, RMA wrote: Reason for CRM: Gwinda Passe from [facility] is requesting verbal orders to continue physical therapy twice a week x4 week and then once a week x2 weeks, and please approve a home health aide,please return call to [place] at [phone]

## 2018-01-28 ENCOUNTER — Other Ambulatory Visit: Payer: Self-pay

## 2018-01-28 ENCOUNTER — Telehealth: Payer: Self-pay | Admitting: Internal Medicine

## 2018-01-28 ENCOUNTER — Emergency Department (HOSPITAL_COMMUNITY): Payer: Medicare Other

## 2018-01-28 ENCOUNTER — Encounter (HOSPITAL_COMMUNITY): Payer: Self-pay

## 2018-01-28 ENCOUNTER — Inpatient Hospital Stay (HOSPITAL_COMMUNITY)
Admission: EM | Admit: 2018-01-28 | Discharge: 2018-02-01 | DRG: 190 | Disposition: A | Discharge status: Home Health | Payer: Medicare Other | Attending: Family Medicine | Admitting: Family Medicine

## 2018-01-28 ENCOUNTER — Ambulatory Visit: Payer: Self-pay | Admitting: *Deleted

## 2018-01-28 DIAGNOSIS — K219 Gastro-esophageal reflux disease without esophagitis: Secondary | ICD-10-CM | POA: Diagnosis present

## 2018-01-28 DIAGNOSIS — J9601 Acute respiratory failure with hypoxia: Secondary | ICD-10-CM | POA: Diagnosis not present

## 2018-01-28 DIAGNOSIS — E440 Moderate protein-calorie malnutrition: Secondary | ICD-10-CM | POA: Diagnosis present

## 2018-01-28 DIAGNOSIS — J9621 Acute and chronic respiratory failure with hypoxia: Secondary | ICD-10-CM | POA: Diagnosis present

## 2018-01-28 DIAGNOSIS — B370 Candidal stomatitis: Secondary | ICD-10-CM | POA: Diagnosis present

## 2018-01-28 DIAGNOSIS — K589 Irritable bowel syndrome without diarrhea: Secondary | ICD-10-CM | POA: Diagnosis present

## 2018-01-28 DIAGNOSIS — D72829 Elevated white blood cell count, unspecified: Secondary | ICD-10-CM | POA: Diagnosis present

## 2018-01-28 DIAGNOSIS — F329 Major depressive disorder, single episode, unspecified: Secondary | ICD-10-CM

## 2018-01-28 DIAGNOSIS — E039 Hypothyroidism, unspecified: Secondary | ICD-10-CM | POA: Diagnosis not present

## 2018-01-28 DIAGNOSIS — E876 Hypokalemia: Secondary | ICD-10-CM | POA: Diagnosis not present

## 2018-01-28 DIAGNOSIS — M545 Low back pain: Secondary | ICD-10-CM | POA: Diagnosis not present

## 2018-01-28 DIAGNOSIS — J441 Chronic obstructive pulmonary disease with (acute) exacerbation: Secondary | ICD-10-CM | POA: Diagnosis not present

## 2018-01-28 DIAGNOSIS — M25552 Pain in left hip: Secondary | ICD-10-CM | POA: Diagnosis not present

## 2018-01-28 DIAGNOSIS — I10 Essential (primary) hypertension: Secondary | ICD-10-CM | POA: Diagnosis present

## 2018-01-28 DIAGNOSIS — R05 Cough: Secondary | ICD-10-CM | POA: Diagnosis not present

## 2018-01-28 DIAGNOSIS — Z90710 Acquired absence of both cervix and uterus: Secondary | ICD-10-CM

## 2018-01-28 DIAGNOSIS — R0902 Hypoxemia: Secondary | ICD-10-CM | POA: Diagnosis not present

## 2018-01-28 DIAGNOSIS — J439 Emphysema, unspecified: Secondary | ICD-10-CM | POA: Diagnosis not present

## 2018-01-28 DIAGNOSIS — M6281 Muscle weakness (generalized): Secondary | ICD-10-CM | POA: Diagnosis not present

## 2018-01-28 DIAGNOSIS — F419 Anxiety disorder, unspecified: Secondary | ICD-10-CM | POA: Diagnosis not present

## 2018-01-28 DIAGNOSIS — E785 Hyperlipidemia, unspecified: Secondary | ICD-10-CM | POA: Diagnosis present

## 2018-01-28 DIAGNOSIS — I2510 Atherosclerotic heart disease of native coronary artery without angina pectoris: Secondary | ICD-10-CM | POA: Diagnosis present

## 2018-01-28 DIAGNOSIS — Z79890 Hormone replacement therapy (postmenopausal): Secondary | ICD-10-CM | POA: Diagnosis not present

## 2018-01-28 DIAGNOSIS — F32A Depression, unspecified: Secondary | ICD-10-CM | POA: Diagnosis present

## 2018-01-28 LAB — CBC WITH DIFFERENTIAL/PLATELET
Basophils Absolute: 0 10*3/uL (ref 0.0–0.1)
Basophils Relative: 0 %
Eosinophils Absolute: 0.1 10*3/uL (ref 0.0–0.7)
Eosinophils Relative: 1 %
HCT: 36.1 % (ref 36.0–46.0)
Hemoglobin: 11.2 g/dL — ABNORMAL LOW (ref 12.0–15.0)
Lymphocytes Relative: 19 %
Lymphs Abs: 1.3 10*3/uL (ref 0.7–4.0)
MCH: 28.6 pg (ref 26.0–34.0)
MCHC: 31 g/dL (ref 30.0–36.0)
MCV: 92.1 fL (ref 78.0–100.0)
Monocytes Absolute: 0.6 10*3/uL (ref 0.1–1.0)
Monocytes Relative: 8 %
Neutro Abs: 4.8 10*3/uL (ref 1.7–7.7)
Neutrophils Relative %: 72 %
Platelets: 406 10*3/uL — ABNORMAL HIGH (ref 150–400)
RBC: 3.92 MIL/uL (ref 3.87–5.11)
RDW: 14.3 % (ref 11.5–15.5)
WBC: 6.8 10*3/uL (ref 4.0–10.5)

## 2018-01-28 LAB — BASIC METABOLIC PANEL
Anion gap: 12 (ref 5–15)
BUN: 9 mg/dL (ref 6–20)
CO2: 27 mmol/L (ref 22–32)
Calcium: 9.1 mg/dL (ref 8.9–10.3)
Chloride: 101 mmol/L (ref 101–111)
Creatinine, Ser: 0.69 mg/dL (ref 0.44–1.00)
GFR calc Af Amer: >60 mL/min (ref >60)
GFR calc non Af Amer: >60 mL/min (ref >60)
Glucose, Bld: 110 mg/dL — ABNORMAL HIGH (ref 65–99)
Potassium: 3.4 mmol/L — ABNORMAL LOW (ref 3.5–5.1)
Sodium: 140 mmol/L (ref 135–145)

## 2018-01-28 LAB — I-STAT CHEM 8, ED
BUN: 8 mg/dL (ref 6–20)
Calcium, Ion: 1.14 mmol/L — ABNORMAL LOW (ref 1.15–1.40)
Chloride: 98 mmol/L — ABNORMAL LOW (ref 101–111)
Creatinine, Ser: 0.6 mg/dL (ref 0.44–1.00)
Glucose, Bld: 107 mg/dL — ABNORMAL HIGH (ref 65–99)
HCT: 35 % — ABNORMAL LOW (ref 36.0–46.0)
Hemoglobin: 11.9 g/dL — ABNORMAL LOW (ref 12.0–15.0)
Potassium: 3.5 mmol/L (ref 3.5–5.1)
Sodium: 140 mmol/L (ref 135–145)
TCO2: 32 mmol/L (ref 22–32)

## 2018-01-28 LAB — INFLUENZA PANEL BY PCR (TYPE A & B)
Influenza A By PCR: NEGATIVE
Influenza B By PCR: NEGATIVE

## 2018-01-28 LAB — I-STAT TROPONIN, ED: Troponin i, poc: 0 ng/mL (ref 0.00–0.08)

## 2018-01-28 LAB — BRAIN NATRIURETIC PEPTIDE: B Natriuretic Peptide: 128.7 pg/mL — ABNORMAL HIGH (ref 0.0–100.0)

## 2018-01-28 MED ORDER — GUAIFENESIN-DM 100-10 MG/5ML PO SYRP
15.0000 mL | ORAL_SOLUTION | ORAL | Status: DC | PRN
  Administered 2018-01-28 – 2018-01-31 (×3): 15 mL via ORAL
  Filled 2018-01-28 (×3): qty 20

## 2018-01-28 MED ORDER — MAGNESIUM SULFATE 2 GM/50ML IV SOLN
2.0000 g | Freq: Once | INTRAVENOUS | Status: AC
  Administered 2018-01-28: 2 g via INTRAVENOUS
  Filled 2018-01-28: qty 50

## 2018-01-28 MED ORDER — PANTOPRAZOLE SODIUM 40 MG PO TBEC
40.0000 mg | DELAYED_RELEASE_TABLET | Freq: Every day | ORAL | Status: DC
  Administered 2018-01-29 – 2018-02-01 (×4): 40 mg via ORAL
  Filled 2018-01-28 (×3): qty 1

## 2018-01-28 MED ORDER — CLORAZEPATE DIPOTASSIUM 7.5 MG PO TABS
7.5000 mg | ORAL_TABLET | Freq: Two times a day (BID) | ORAL | Status: DC | PRN
  Administered 2018-01-28 – 2018-01-31 (×6): 7.5 mg via ORAL
  Filled 2018-01-28 (×6): qty 1

## 2018-01-28 MED ORDER — METHYLPREDNISOLONE SODIUM SUCC 125 MG IJ SOLR
125.0000 mg | Freq: Once | INTRAMUSCULAR | Status: AC
  Administered 2018-01-28: 125 mg via INTRAVENOUS
  Filled 2018-01-28: qty 2

## 2018-01-28 MED ORDER — IPRATROPIUM-ALBUTEROL 0.5-2.5 (3) MG/3ML IN SOLN: 3.0000 mL | Freq: Once | RESPIRATORY_TRACT | Status: DC

## 2018-01-28 MED ORDER — SERTRALINE HCL 50 MG PO TABS
50.0000 mg | ORAL_TABLET | Freq: Every day | ORAL | Status: DC
  Administered 2018-01-29 – 2018-02-01 (×4): 50 mg via ORAL
  Filled 2018-01-28 (×4): qty 1

## 2018-01-28 MED ORDER — ALBUTEROL SULFATE (2.5 MG/3ML) 0.083% IN NEBU
5.0000 mg | INHALATION_SOLUTION | Freq: Once | RESPIRATORY_TRACT | Status: AC
  Administered 2018-01-28: 5 mg via RESPIRATORY_TRACT
  Filled 2018-01-28: qty 6

## 2018-01-28 MED ORDER — IOPAMIDOL (ISOVUE-370) INJECTION 76%
INTRAVENOUS | Status: AC
  Administered 2018-01-28: 100 mL
  Filled 2018-01-28: qty 100

## 2018-01-28 MED ORDER — ONDANSETRON HCL 4 MG PO TABS: 4.0000 mg | ORAL_TABLET | Freq: Four times a day (QID) | ORAL | Status: DC | PRN

## 2018-01-28 MED ORDER — ALBUTEROL SULFATE (2.5 MG/3ML) 0.083% IN NEBU
2.5000 mg | INHALATION_SOLUTION | RESPIRATORY_TRACT | Status: DC | PRN
  Administered 2018-01-29: 2.5 mg via RESPIRATORY_TRACT
  Filled 2018-01-28: qty 3

## 2018-01-28 MED ORDER — LEVOTHYROXINE SODIUM 25 MCG PO TABS
25.0000 ug | ORAL_TABLET | Freq: Every day | ORAL | Status: DC
  Administered 2018-01-29 – 2018-02-01 (×4): 25 ug via ORAL
  Filled 2018-01-28 (×4): qty 1

## 2018-01-28 MED ORDER — ONDANSETRON HCL 4 MG/2ML IJ SOLN: 4.0000 mg | Freq: Four times a day (QID) | INTRAMUSCULAR | Status: DC | PRN

## 2018-01-28 MED ORDER — DILTIAZEM HCL ER COATED BEADS 240 MG PO CP24
240.0000 mg | ORAL_CAPSULE | Freq: Every day | ORAL | Status: DC
  Administered 2018-01-29 – 2018-02-01 (×4): 240 mg via ORAL
  Filled 2018-01-28 (×4): qty 1

## 2018-01-28 MED ORDER — IPRATROPIUM BROMIDE 0.02 % IN SOLN
0.5000 mg | Freq: Once | RESPIRATORY_TRACT | Status: AC
  Administered 2018-01-28: 0.5 mg via RESPIRATORY_TRACT
  Filled 2018-01-28: qty 2.5

## 2018-01-28 MED ORDER — SODIUM CHLORIDE 0.9 % IV SOLN
100.0000 mg | Freq: Two times a day (BID) | INTRAVENOUS | Status: DC
  Administered 2018-01-28 – 2018-01-29 (×3): 100 mg via INTRAVENOUS
  Filled 2018-01-28 (×4): qty 100

## 2018-01-28 MED ORDER — IPRATROPIUM-ALBUTEROL 0.5-2.5 (3) MG/3ML IN SOLN
3.0000 mL | Freq: Four times a day (QID) | RESPIRATORY_TRACT | Status: DC
  Administered 2018-01-29 – 2018-02-01 (×13): 3 mL via RESPIRATORY_TRACT
  Filled 2018-01-28 (×14): qty 3

## 2018-01-28 MED ORDER — ENOXAPARIN SODIUM 40 MG/0.4ML ~~LOC~~ SOLN
40.0000 mg | Freq: Every day | SUBCUTANEOUS | Status: DC
  Administered 2018-01-28 – 2018-01-31 (×4): 40 mg via SUBCUTANEOUS
  Filled 2018-01-28 (×4): qty 0.4

## 2018-01-28 MED ORDER — METHYLPREDNISOLONE SODIUM SUCC 125 MG IJ SOLR
60.0000 mg | Freq: Two times a day (BID) | INTRAMUSCULAR | Status: DC
  Administered 2018-01-29: 60 mg via INTRAVENOUS
  Filled 2018-01-28: qty 2

## 2018-01-28 MED ORDER — ALBUTEROL (5 MG/ML) CONTINUOUS INHALATION SOLN
10.0000 mg/h | INHALATION_SOLUTION | Freq: Once | RESPIRATORY_TRACT | Status: AC
  Administered 2018-01-28: 10 mg/h via RESPIRATORY_TRACT
  Filled 2018-01-28: qty 20

## 2018-01-28 MED ORDER — DOCUSATE SODIUM 100 MG PO CAPS
100.0000 mg | ORAL_CAPSULE | Freq: Two times a day (BID) | ORAL | Status: DC
  Administered 2018-01-28 – 2018-02-01 (×8): 100 mg via ORAL
  Filled 2018-01-28 (×8): qty 1

## 2018-01-28 MED ORDER — TRAZODONE HCL 50 MG PO TABS
50.0000 mg | ORAL_TABLET | Freq: Once | ORAL | Status: AC
  Administered 2018-01-28: 50 mg via ORAL
  Filled 2018-01-28: qty 1

## 2018-01-28 MED ORDER — POLYVINYL ALCOHOL 1.4 % OP SOLN: Freq: Two times a day (BID) | OPHTHALMIC | Status: DC | PRN

## 2018-01-28 NOTE — Care Management Note (Signed)
Case Management Note  Patient Details  Name: [name] MRN: [id number] Date of Birth: [birth year]  CM noted pt was active with [facility]. Contacted Michelle with [facility] and comfirmed HH PT and OT only.  She will follow for needs.  Expected Discharge Date:    Unknown              Expected Discharge Plan:  [place]  Post Acute Care Choice:  Home Health Choice offered to:  Patient  HH Arranged:  PT, OT HH Agency:  [facility]  Status of Service:  In process, will continue to follow  Rae Mar, RN 01/28/2018, 1:28 PM

## 2018-01-28 NOTE — ED Provider Notes (Addendum)
[name] DEPT Provider Note   CSN: [id number] Arrival date & time: 01/28/18  1210     History   Chief Complaint Chief Complaint  Patient presents with  . decreased SPO2  . Generalized Body Aches    HPI Heather [name] is a 83 y.o. female with a hx of COPD with emphysema, CAD, anxiety, depression, hypothyroidism, and IBS who arrives to the ED via EMS from home due to occupational therapy concern for oxygen saturation of 84% this AM. Patient states she has felt intermittently dyspneic with productive cough for the past 1 weeks. States sputum is milky in color. Has had some congestion and rhinorrhea as well.  Dyspnea has woken her from sleep, however does not seem more severe with laying in supine position. Symptoms do not have specific alleviating or aggravating factors.  Has had some chills with using nebulizer. Received 1 nebulizer tx this AM without improvement of SpO2 prompting EMS call. Upon EMS arrival patient found to be at 91% on RA. Patient has been using inhalers as prescribed including albuterol and spiriva. Was admitted to the hospital 1.5 years ago for COPD exacerbation, no admissions since, she reports that she was instructed for at home oxygen use after admission, however PCP did not think this was necessary therefore patient is not on oxygen. Denies chest pain, fever, leg swelling, or palpitations.   She was seen by her primary care provider 2 days ago for chronic left hip pain, mentioned her cough did not have any intervention with these. Recent increase in amount or tramadol prescribed for chronic L hip pain- patient requesting X-ray no recent new onset injuries. No numbness/weakenss. Ambulates with cane at baseline.   HPI  Past Medical History:  Diagnosis Date  . ANXIETY 07/23/2009  . Atherosclerosis of aorta ([place]) 07/24/2015  . Chronic pain   . Complication of anesthesia 7 or 8 yrs ago   woke up during colonscopy  . Coronary  atherosclerosis 07/24/2015  . DEPRESSION 07/23/2009  . DIVERTICULOSIS, COLON 07/23/2009  . GERD 07/23/2009  . Headache(784.0) 07/23/2009   occasional  . Hemorrhoids   . HIP PAIN, LEFT 05/16/2010  . History of Crohn's disease   . HYPERLIPIDEMIA 07/23/2009  . HYPERTENSION 07/23/2009  . HYPOTHYROIDISM 07/23/2009  . IBS (irritable bowel syndrome)   . Insomnia   . LOW BACK PAIN, CHRONIC 10/01/2009  . PARESTHESIA 10/01/2009  . Shingles 2006   back    Patient Active Problem List   Diagnosis Date Noted  . COPD with emphysema ([place]) 06/26/2017  . COPD exacerbation ([place]) 05/29/2016  . Hypoxia 05/29/2016  . Coronary atherosclerosis 07/24/2015  . Atherosclerosis of aorta ([place]) 07/24/2015  . Dysphagia 07/24/2015  . Acute respiratory failure with hypoxia ([place]) 07/23/2015  . Bronchospasm with bronchitis, acute 07/23/2015  . HIP PAIN, LEFT 05/16/2010  . LOW BACK PAIN, CHRONIC 10/01/2009  . PARESTHESIA 10/01/2009  . Hypothyroidism 07/23/2009  . Dyslipidemia 07/23/2009  . Anxiety state 07/23/2009  . Anxiety and depression 07/23/2009  . Essential hypertension 07/23/2009  . GERD 07/23/2009  . DIVERTICULOSIS, COLON 07/23/2009  . HEADACHE 07/23/2009    Past Surgical History:  Procedure Laterality Date  . ABDOMINAL HYSTERECTOMY  age 57 or 67  . APPENDECTOMY  yrs ago  . BILATERAL SALPINGOOPHORECTOMY  age 75 or 47  . COLONOSCOPY N/A 11/17/2013   Procedure: COLONOSCOPY;  Surgeon: Cleotis Nipper, MD;  Location: [facility] END[facility]COPY;  Service: Endoscopy;  Laterality: N/A;  . ESOPHAGOGASTRODUODENOSCOPY N/A 11/17/2013   Procedure: ESOPHAGOGASTRODUODENOSCOPY (  EGD);  Surgeon: Cleotis Nipper, MD;  Location: [facility] ENDOSCOPY;  Service: Endoscopy;  Laterality: N/A;  . HEMORRHOID SURGERY  yrs ago  . TONSILLECTOMY  yrs ago    OB History    No data available       Home Medications    Prior to Admission medications   Medication Sig Start Date End Date Taking? Authorizing Provider  albuterol (PROVENTIL) (2.5  MG/3ML) 0.083% nebulizer solution INHALE CONTENTS OF 1 AMP VIA NEBULIZER EVERY 6 HRS AS NEEDED FOR WHEEZING/ SHORTNESS OF BREATH. 12/17/17   [name], MD  Albuterol Sulfate (PROAIR RESPICLICK) 858 (90 Base) MCG/ACT AEPB Inhale 2 puffs into the lungs 4 (four) times daily as needed. 01/26/18   [name], MD  Cholecalciferol 2000 UNITS TABS Take 2,000 Units by mouth daily.    [provider]  clorazepate (TRANXENE) 7.5 MG tablet Take 1 tablet (7.5 mg total) by mouth 2 (two) times daily as needed for anxiety. 01/10/18   [name], MD  diltiazem (CARDIZEM CD) 240 MG 24 hr capsule TAKE (1) CAPSULE DAILY. 10/26/17   [name], MD  docusate sodium (COLACE) 100 MG capsule Take 1 capsule (100 mg total) by mouth 2 (two) times daily. 06/01/16   Bonnielee Haff, MD  esomeprazole ([place]) 40 MG capsule TAKE 1 CAPSULE DAILY BEFORE BREAKFAST. 06/26/17   [name], MD  feeding supplement, ENSURE ENLIVE, (ENSURE ENLIVE) LIQD Take 237 mLs by mouth 2 (two) times daily between meals. Patient taking differently: Take 237 mLs by mouth 2 (two) times daily as needed (poor appetite).  07/25/15   Robbie Lis, MD  furosemide (LASIX) 20 MG tablet TAKE 1 TABLET DAILY IF NEEDED FOR SIGNIFICANT LOWER EXTREMITY SWELLING. 06/26/17   [name], MD  levothyroxine (SYNTHROID, LEVOTHROID) 25 MCG tablet Take 1 tablet (25 mcg total) by mouth daily before breakfast. 06/26/17   [name], MD  Polyethyl Glycol-Propyl Glycol (SYSTANE OP) Place 1 drop into both eyes 2 (two) times daily as needed (for dry eyes).     [provider]  polyethylene glycol (MIRALAX / GLYCOLAX) packet Take 17 g by mouth 2 (two) times daily. 06/01/16   Bonnielee Haff, MD  sertraline (ZOLOFT) 50 MG tablet Take 1 tablet (50 mg total) by mouth daily. 09/28/17   [name], MD  tiotropium (SPIRIVA HANDIHALER) 18 MCG inhalation capsule Place 1 capsule (18 mcg total) into  inhaler and inhale daily. 01/26/18   [name], MD  traMADol (ULTRAM) 50 MG tablet Take 1 tablet (50 mg total) by mouth every 6 (six) hours as needed. 01/26/18   [name], MD  traZODone (DESYREL) 50 MG tablet TAKE 1/2 TO 1 TABLET AT BEDTIME AS NEEDED FOR REST. 01/18/18   [name], MD    Family History Family History  Problem Relation Age of Onset  . Cancer Mother        Unknown type  . COPD Sister   . Cancer Sister        Breast  . Clotting disorder Neg Hx     Social History Social History   Tobacco Use  . Smoking status: Never Smoker  . Smokeless tobacco: Never Used  . Tobacco comment: smoked rarely in youth  Substance Use Topics  . Alcohol use: No    Alcohol/week: 0.0 oz  . Drug use: No     Allergies   Tylenol [acetaminophen]; Symbicort [budesonide-formoterol fumarate]; Duloxetine; Metronidazole; and Rofecoxib  Review of Systems Review of Systems  Constitutional: Positive for chills (with nebulizer use). Negative for fever.  HENT: Positive for congestion and rhinorrhea. Negative for ear pain and sore throat.   Respiratory: Positive for cough and shortness of breath.   Cardiovascular: Negative for chest pain, palpitations and leg swelling.  Gastrointestinal: Negative for abdominal pain, blood in stool and diarrhea.  Musculoskeletal: Positive for arthralgias (L hip pain). Negative for back pain and neck pain.  Neurological: Negative for weakness and numbness.  All other systems reviewed and are negative.    Physical Exam Updated Vital Signs BP 122/61   Pulse 74   Temp 99 F (37.2 C) (Oral)   Resp 10   Ht 4\' 11"  (1.499 m)   Wt 49 kg (108 lb)   SpO2 100%   BMI 21.81 kg/m   Physical Exam  Constitutional: She appears well-developed and well-nourished.  Non-toxic appearance. No distress.  HENT:  Head: Normocephalic and atraumatic.  Right Ear: Tympanic membrane is not perforated, not erythematous, not retracted and not  bulging.  Left Ear: Tympanic membrane is not perforated, not erythematous, not retracted and not bulging.  Nose: Mucosal edema present.  Mouth/Throat: Uvula is midline and oropharynx is clear and moist. No oropharyngeal exudate or posterior oropharyngeal erythema.  Eyes: Conjunctivae are normal. Pupils are equal, round, and reactive to light. Right eye exhibits no discharge. Left eye exhibits no discharge.  Neck: Normal range of motion. Neck supple.  Cardiovascular: Normal rate and regular rhythm.  No murmur heard. Pulses:      Dorsalis pedis pulses are 2+ on the right side, and 2+ on the left side.  Pulmonary/Chest: No respiratory distress. She has decreased breath sounds. She has wheezes (mild expiratory throughout). She has no rales.  Patient has generally poor air movement throughout.   Abdominal: Soft. She exhibits no distension. There is no tenderness.  Musculoskeletal:  Back: No midline tenderness Lower extremities: L hip is diffusely tender to palpation, no point tenderness.   Lymphadenopathy:    She has no cervical adenopathy.  Neurological: She is alert.  Clear speech. Sensation grossly intact to bilateral lower extremities. Patient is able to lift both legs off of the bed.   Skin: Skin is warm and dry. No rash noted.  Psychiatric: She has a normal mood and affect. Her behavior is normal.  Nursing note and vitals reviewed.    ED Treatments / Results  Labs Results for orders placed or performed during the hospital encounter of 46/50/35  Basic metabolic panel  Result Value Ref Range   Sodium 140 135 - 145 mmol/L   Potassium 3.4 (L) 3.5 - 5.1 mmol/L   Chloride 101 101 - 111 mmol/L   CO2 27 22 - 32 mmol/L   Glucose, Bld 110 (H) 65 - 99 mg/dL   BUN 9 6 - 20 mg/dL   Creatinine, Ser 0.69 0.44 - 1.00 mg/dL   Calcium 9.1 8.9 - 10.3 mg/dL   GFR calc non Af Amer >60 >60 mL/min   GFR calc Af Amer >60 >60 mL/min   Anion gap 12 5 - 15  CBC with Differential  Result Value Ref  Range   WBC 6.8 4.0 - 10.5 K/uL   RBC 3.92 3.87 - 5.11 MIL/uL   Hemoglobin 11.2 (L) 12.0 - 15.0 g/dL   HCT 36.1 36.0 - 46.0 %   MCV 92.1 78.0 - 100.0 fL   MCH 28.6 26.0 - 34.0 pg   MCHC 31.0 30.0 - 36.0 g/dL  RDW 14.3 11.5 - 15.5 %   Platelets 406 (H) 150 - 400 K/uL   Neutrophils Relative % 72 %   Neutro Abs 4.8 1.7 - 7.7 K/uL   Lymphocytes Relative 19 %   Lymphs Abs 1.3 0.7 - 4.0 K/uL   Monocytes Relative 8 %   Monocytes Absolute 0.6 0.1 - 1.0 K/uL   Eosinophils Relative 1 %   Eosinophils Absolute 0.1 0.0 - 0.7 K/uL   Basophils Relative 0 %   Basophils Absolute 0.0 0.0 - 0.1 K/uL  Influenza panel by PCR (type A & B)  Result Value Ref Range   Influenza A By PCR NEGATIVE NEGATIVE   Influenza B By PCR NEGATIVE NEGATIVE  Brain natriuretic peptide  Result Value Ref Range   B Natriuretic Peptide 128.7 (H) 0.0 - 100.0 pg/mL  I-stat troponin, ED  Result Value Ref Range   Troponin i, poc 0.00 0.00 - 0.08 ng/mL   Comment 3          I-stat chem 8, ed  Result Value Ref Range   Sodium 140 135 - 145 mmol/L   Potassium 3.5 3.5 - 5.1 mmol/L   Chloride 98 (L) 101 - 111 mmol/L   BUN 8 6 - 20 mg/dL   Creatinine, Ser 0.60 0.44 - 1.00 mg/dL   Glucose, Bld 107 (H) 65 - 99 mg/dL   Calcium, Ion 1.14 (L) 1.15 - 1.40 mmol/L   TCO2 32 22 - 32 mmol/L   Hemoglobin 11.9 (L) 12.0 - 15.0 g/dL   HCT 35.0 (L) 36.0 - 46.0 %    EKG  EKG Interpretation  Date/Time:  Thursday January 28 2018 12:29:32 EST Ventricular Rate:  71 PR Interval:    QRS Duration: 90 QT Interval:  423 QTC Calculation: 460 R Axis:   63 Text Interpretation:  Sinus rhythm Nonspecific T abnormalities, diffuse leads No significant change since last tracing Confirmed by Wandra Arthurs [phone]) on 01/28/2018 1:44:38 PM       Radiology Dg Chest 2 View  Result Date: 01/28/2018 CLINICAL DATA:  Cough. EXAM: CHEST  2 VIEW COMPARISON:  Radiographs of May 28, 2016. FINDINGS: The heart size and mediastinal contours are within normal  limits. Both lungs are clear. No pneumothorax or pleural effusion is noted. The visualized skeletal structures are unremarkable. IMPRESSION: No active cardiopulmonary disease. Electronically Signed   By: Marijo Conception, M.D.   On: 01/28/2018 15:44   Ct Angio Chest Pe W And/or Wo Contrast  Result Date: 01/28/2018 CLINICAL DATA:  Low oxygen saturations.  Cough x2 days and vomiting. EXAM: CT ANGIOGRAPHY CHEST WITH CONTRAST TECHNIQUE: Multidetector CT imaging of the chest was performed using the standard protocol during bolus administration of intravenous contrast. Multiplanar CT image reconstructions and MIPs were obtained to evaluate the vascular anatomy. CONTRAST:  142mL ISOVUE-370 IOPAMIDOL (ISOVUE-370) INJECTION 76% COMPARISON:  07/23/2015 CT FINDINGS: Cardiovascular: Satisfactory opacification of the pulmonary arteries to the segmental level. No evidence of pulmonary embolism. Normal heart size. No pericardial effusion. Coronary arteriosclerosis is noted along the LAD. No aortic aneurysm. There is aortic atherosclerosis. Mediastinum/Nodes: No enlarged mediastinal, hilar, or axillary lymph nodes. Thyroid gland, trachea, and esophagus demonstrate no significant findings. Lungs/Pleura: Centrilobular emphysema of the lungs. No dominant mass or pneumonic consolidations. No effusion or pneumothorax. There is subsegmental atelectasis at the right lung base. Mild peribronchial thickening is seen to the right lower lobe where there is a 1 cm rounded opacity likely representing a small focus of mucous plugging. As a  pulmonary nodule is not entirely excluded, short-term interval follow-up is recommended. Upper Abdomen: No acute abnormality. Musculoskeletal: No chest wall abnormality. No acute or significant osseous findings. Review of the MIP images confirms the above findings. IMPRESSION: 1. Centrilobular emphysema with chronic bronchitic change and peribronchial thickening noted. Probable area of inspissated mucus in  the right lower lobe accounting for a nodular 1 cm opacity seen, new since prior 2016 CT. Consider one of the following in 3 months for both low-risk and high-risk individuals: (a) repeat chest CT, (b) follow-up PET-CT, or (c) tissue sampling. This recommendation follows the consensus statement: Guidelines for Management of Incidental Pulmonary Nodules Detected on CT Images: From the Fleischner Society 2017; Radiology 2017; 284:228-243. 2. No acute pulmonary embolus. 3. Coronary arteriosclerosis and aortic atherosclerosis. No aneurysm or dissection of the aorta. Aortic Atherosclerosis (ICD10-I70.0) and Emphysema (ICD10-J43.9). Electronically Signed   By: Ashley Royalty M.D.   On: 01/28/2018 18:41   Dg Hip Unilat With Pelvis 2-3 Views Left  Result Date: 01/28/2018 CLINICAL DATA:  Left hip pain. EXAM: DG HIP (WITH OR WITHOUT PELVIS) 2-3V LEFT COMPARISON:  None. FINDINGS: There is no evidence of hip fracture or dislocation. There is no evidence of arthropathy or other focal bone abnormality. IMPRESSION: Normal left hip. Electronically Signed   By: Marijo Conception, M.D.   On: 01/28/2018 15:46    Procedures Procedures (including critical care time)  Medications Ordered in ED Medications  methylPREDNISolone sodium succinate (SOLU-MEDROL) 125 mg/2 mL injection 125 mg (not administered)  albuterol (PROVENTIL) (2.5 MG/3ML) 0.083% nebulizer solution 5 mg (not administered)  ipratropium (ATROVENT) nebulizer solution 0.5 mg (not administered)    Initial Impression / Assessment and Plan / ED Course  I have reviewed the triage vital signs and the nursing notes.  Pertinent labs & imaging results that were available during my care of the patient were reviewed by me and considered in my medical decision making (see chart for details).   Patient presents with cough and dyspnea x 1 week with concern for desaturation to 84% this AM. Patient is nontoxic appearing, in no apparent distress, initial vitals are WNL on 2L  oxygen. On exam patient has poor air movement and minimal expiratory wheeze throughout. SpO2 >95% on 2L while in the room on initial exam therefore was wheaned off oxygen, maintained SpO2 >90% on RA will maintain on RA and continue to monitor. Will evaluate with CXR, EKG, screening lab work, influenza swab, as well as troponin and BNP. Will X-ray L hip at patient's request. Tx initiated with DuoNeb and Solumedrol.   14:00: Desaturated to 83% on RA- placed back on 2L O2 via [place], will add on CTA of the chest to evaluate for pulmonary embolism.   Lab work grossly unremarkable. Patient hgb 11.2- consistent with baseline. BNP minimally elevated. No leukocytosis. No significant electrolyte abnormalities. Troponin negative, EKG unchanged from previous, doubt ACS. CXR without active cardiopulmonary disease- no indication of pneumonia. CT negative for pulmonary embolism- Noted to have centrilobular emphysema with chronic bronchitic change and peribronchial thickening. Also mention of nodular 1 cm opacity seen, new since prior 2016 CT with recommended follow-up per CT read.  On re-evaluation patient continues to desat to the 80s when taken off of O2. Will administer continuous duoneb and IV magnesium. Plan for admission for COPD exacerbation and new onset oxygen requirement.   Findings and plan of care discussed with supervising physician Dr. Darl Householder who personally evaluated and examined this patient and is in agreement with plan.  Discussed case with hospitalist Dr. Lorin Mercy who accepts admission.    Final Clinical Impressions(s) / ED Diagnoses   Final diagnoses:  COPD exacerbation [facility])    ED Discharge Orders    None       Amaryllis Dyke, PA-C 01/28/18 2317    Drenda Freeze, MD 01/29/18 [phone]  CRITICAL CARE Performed by: Kennith Maes  Total critical care time: 35 minutes  Critical care time was exclusive of separately billable procedures and treating other  patients.  Critical care was necessary to treat or prevent imminent or life-threatening deterioration.  Critical care was time spent personally by me on the following activities: development of treatment plan with patient and/or surrogate as well as nursing, discussions with consultants, evaluation of patient's response to treatment, examination of patient, obtaining history from patient or surrogate, ordering and performing treatments and interventions, ordering and review of laboratory studies, ordering and review of radiographic studies, pulse oximetry and re-evaluation of patient's condition.      Leafy Kindle 02/17/18 1958    Drenda Freeze, MD 02/20/18 [phone]

## 2018-01-28 NOTE — ED Notes (Signed)
ED TO INPATIENT HANDOFF REPORT  Name/Age/Gender Heather [name] 83 y.o. female  Code Status Code Status History    Date Active Date Inactive Code Status Order ID Comments User Context   05/29/2016 01:37 06/01/2016 19:03 Full Code [id number]  Karmen Bongo, MD Inpatient   07/23/2015 17:32 07/26/2015 18:02 Full Code [id number]  RamaVenetia Maxon, MD Inpatient   08/13/2014 20:11 08/16/2014 20:04 Full Code [id number]  Elgergawy, [name], MD Inpatient    Advance Directive Documentation     Most Recent Value  Type of Advance Directive  Living will  Pre-existing out of facility DNR order (yellow form or pink MOST form)  No data  "MOST" Form in Place?  No data      Home/SNF/Other Home  Chief Complaint Body Pain  Level of Care/Admitting Diagnosis ED Disposition    ED Disposition Condition Comment   Admit  Hospital Area: [facility] [[id number]]  Level of Care: Med-Surg [16]  Diagnosis: COPD with acute exacerbation [facility]) [[id number]]  Admitting Physician: Karmen Bongo [[id number]]  Attending Physician: Karmen Bongo [[id number]]  Estimated length of stay: 3 - 4 days  Certification:: I certify this patient will need inpatient services for at least 2 midnights  PT Class (Do Not Modify): Inpatient [101]  PT Acc Code (Do Not Modify): Private [1]       Medical History Past Medical History:  Diagnosis Date  . ANXIETY 07/23/2009  . Atherosclerosis of aorta ([place]) 07/24/2015  . Complication of anesthesia 7 or 8 yrs ago   woke up during colonscopy  . Coronary atherosclerosis 07/24/2015  . DEPRESSION 07/23/2009  . DIVERTICULOSIS, COLON 07/23/2009  . GERD 07/23/2009  . Headache(784.0) 07/23/2009   occasional  . Hemorrhoids   . HIP PAIN, LEFT 05/16/2010  . History of Crohn's disease   . HYPERLIPIDEMIA 07/23/2009  . HYPERTENSION 07/23/2009  . HYPOTHYROIDISM 07/23/2009  . IBS (irritable bowel syndrome)   . Insomnia   . LOW BACK PAIN, CHRONIC 10/01/2009  . PARESTHESIA 10/01/2009  .  Shingles 2006   back    Allergies Allergies  Allergen Reactions  . Tylenol [Acetaminophen] Other (See Comments)    Nightmares  . Symbicort [Budesonide-Formoterol Fumarate] Other (See Comments)    Pt felt like her tongue was swollen, could not swallow  . Duloxetine Other (See Comments)    Patient doesn't recall  . Metronidazole Other (See Comments)    Patient doesn't recall   . Rofecoxib Other (See Comments)    Patient doesn't recall     IV Location/Drains/Wounds Patient Lines/Drains/Airways Status   Active Line/Drains/Airways    Name:   Placement date:   Placement time:   Site:   Days:   Peripheral IV 01/28/18 Left Antecubital   01/28/18    1748    Antecubital   less than 1   Peripheral IV 01/28/18 Anterior;Right Antecubital   01/28/18    1808    Antecubital   less than 1          Labs/Imaging Results for orders placed or performed during the hospital encounter of 01/28/18 (from the past 48 hour(s))  Basic metabolic panel     Status: Abnormal   Collection Time: 01/28/18  2:22 PM  Result Value Ref Range   Sodium 140 135 - 145 mmol/L   Potassium 3.4 (L) 3.5 - 5.1 mmol/L   Chloride 101 101 - 111 mmol/L   CO2 27 22 - 32 mmol/L   Glucose, Bld 110 (H) 65 - 99 mg/dL  BUN 9 6 - 20 mg/dL   Creatinine, Ser 0.69 0.44 - 1.00 mg/dL   Calcium 9.1 8.9 - 10.3 mg/dL   GFR calc non Af Amer >60 >60 mL/min   GFR calc Af Amer >60 >60 mL/min    Comment: (NOTE) The eGFR has been calculated using the CKD EPI equation. This calculation has not been validated in all clinical situations. eGFR's persistently <60 mL/min signify possible Chronic Kidney Disease.    Anion gap 12 5 - 15    Comment: Performed at [facility], [place] [address], [place], [place] [address]  CBC with Differential     Status: Abnormal   Collection Time: 01/28/18  2:22 PM  Result Value Ref Range   WBC 6.8 4.0 - 10.5 K/uL   RBC 3.92 3.87 - 5.11 MIL/uL   Hemoglobin 11.2 (L) 12.0 - 15.0 g/dL   HCT  36.1 36.0 - 46.0 %   MCV 92.1 78.0 - 100.0 fL   MCH 28.6 26.0 - 34.0 pg   MCHC 31.0 30.0 - 36.0 g/dL   RDW 14.3 11.5 - 15.5 %   Platelets 406 (H) 150 - 400 K/uL   Neutrophils Relative % 72 %   Neutro Abs 4.8 1.7 - 7.7 K/uL   Lymphocytes Relative 19 %   Lymphs Abs 1.3 0.7 - 4.0 K/uL   Monocytes Relative 8 %   Monocytes Absolute 0.6 0.1 - 1.0 K/uL   Eosinophils Relative 1 %   Eosinophils Absolute 0.1 0.0 - 0.7 K/uL   Basophils Relative 0 %   Basophils Absolute 0.0 0.0 - 0.1 K/uL    Comment: Performed at [facility], [place] [address] [place], [place] [address]  Influenza panel by PCR (type A & B)     Status: None   Collection Time: 01/28/18  2:22 PM  Result Value Ref Range   Influenza A By PCR NEGATIVE NEGATIVE   Influenza B By PCR NEGATIVE NEGATIVE    Comment: (NOTE) The Xpert Xpress Flu assay is intended as an aid in the diagnosis of  influenza and should not be used as a sole basis for treatment.  This  assay is FDA approved for nasopharyngeal swab specimens only. Nasal  washings and aspirates are unacceptable for Xpert Xpress Flu testing. Performed at [facility], [place] [address] [place], [place] [address]   Brain natriuretic peptide     Status: Abnormal   Collection Time: 01/28/18  2:22 PM  Result Value Ref Range   B Natriuretic Peptide 128.7 (H) 0.0 - 100.0 pg/mL    Comment: Performed at [facility], [place] [address], [place], [place] [address]  I-stat troponin, ED     Status: None   Collection Time: 01/28/18  2:34 PM  Result Value Ref Range   Troponin i, poc 0.00 0.00 - 0.08 ng/mL   Comment 3            Comment: Due to the release kinetics of cTnI, a negative result within the first hours of the onset of symptoms does not rule out myocardial infarction with certainty. If myocardial infarction is still suspected, repeat the test at appropriate intervals.   I-stat chem 8, ed     Status: Abnormal   Collection  Time: 01/28/18  2:35 PM  Result Value Ref Range   Sodium 140 135 - 145 mmol/L   Potassium 3.5 3.5 - 5.1 mmol/L   Chloride 98 (L) 101 - 111 mmol/L   BUN  8 6 - 20 mg/dL   Creatinine, Ser 0.60 0.44 - 1.00 mg/dL   Glucose, Bld 107 (H) 65 - 99 mg/dL   Calcium, Ion 1.14 (L) 1.15 - 1.40 mmol/L   TCO2 32 22 - 32 mmol/L   Hemoglobin 11.9 (L) 12.0 - 15.0 g/dL   HCT 35.0 (L) 36.0 - 46.0 %   Dg Chest 2 View  Result Date: 01/28/2018 CLINICAL DATA:  Cough. EXAM: CHEST  2 VIEW COMPARISON:  Radiographs of May 28, 2016. FINDINGS: The heart size and mediastinal contours are within normal limits. Both lungs are clear. No pneumothorax or pleural effusion is noted. The visualized skeletal structures are unremarkable. IMPRESSION: No active cardiopulmonary disease. Electronically Signed   By: Marijo Conception, M.D.   On: 01/28/2018 15:44   Ct Angio Chest Pe W And/or Wo Contrast  Result Date: 01/28/2018 CLINICAL DATA:  Low oxygen saturations.  Cough x2 days and vomiting. EXAM: CT ANGIOGRAPHY CHEST WITH CONTRAST TECHNIQUE: Multidetector CT imaging of the chest was performed using the standard protocol during bolus administration of intravenous contrast. Multiplanar CT image reconstructions and MIPs were obtained to evaluate the vascular anatomy. CONTRAST:  12m ISOVUE-370 IOPAMIDOL (ISOVUE-370) INJECTION 76% COMPARISON:  07/23/2015 CT FINDINGS: Cardiovascular: Satisfactory opacification of the pulmonary arteries to the segmental level. No evidence of pulmonary embolism. Normal heart size. No pericardial effusion. Coronary arteriosclerosis is noted along the LAD. No aortic aneurysm. There is aortic atherosclerosis. Mediastinum/Nodes: No enlarged mediastinal, hilar, or axillary lymph nodes. Thyroid gland, trachea, and esophagus demonstrate no significant findings. Lungs/Pleura: Centrilobular emphysema of the lungs. No dominant mass or pneumonic consolidations. No effusion or pneumothorax. There is subsegmental atelectasis  at the right lung base. Mild peribronchial thickening is seen to the right lower lobe where there is a 1 cm rounded opacity likely representing a small focus of mucous plugging. As a pulmonary nodule is not entirely excluded, short-term interval follow-up is recommended. Upper Abdomen: No acute abnormality. Musculoskeletal: No chest wall abnormality. No acute or significant osseous findings. Review of the MIP images confirms the above findings. IMPRESSION: 1. Centrilobular emphysema with chronic bronchitic change and peribronchial thickening noted. Probable area of inspissated mucus in the right lower lobe accounting for a nodular 1 cm opacity seen, new since prior 2016 CT. Consider one of the following in 3 months for both low-risk and high-risk individuals: (a) repeat chest CT, (b) follow-up PET-CT, or (c) tissue sampling. This recommendation follows the consensus statement: Guidelines for Management of Incidental Pulmonary Nodules Detected on CT Images: From the Fleischner Society 2017; Radiology 2017; 284:228-243. 2. No acute pulmonary embolus. 3. Coronary arteriosclerosis and aortic atherosclerosis. No aneurysm or dissection of the aorta. Aortic Atherosclerosis (ICD10-I70.0) and Emphysema (ICD10-J43.9). Electronically Signed   By: DAshley RoyaltyM.D.   On: 01/28/2018 18:41   Dg Hip Unilat With Pelvis 2-3 Views Left  Result Date: 01/28/2018 CLINICAL DATA:  Left hip pain. EXAM: DG HIP (WITH OR WITHOUT PELVIS) 2-3V LEFT COMPARISON:  None. FINDINGS: There is no evidence of hip fracture or dislocation. There is no evidence of arthropathy or other focal bone abnormality. IMPRESSION: Normal left hip. Electronically Signed   By: JMarijo Conception M.D.   On: 01/28/2018 15:46    Pending Labs Unresulted Labs (From admission, onward)   None      Vitals/Pain Today's Vitals   01/28/18 1941 01/28/18 1944 01/28/18 2030 01/28/18 2130  BP:  (!) 105/56 (!) 123/43 (!) 109/54  Pulse:  66 74 85  Resp:  14  16 15  Temp:       TempSrc:      SpO2: 99% 100% 100% 94%  Weight:      Height:      PainSc:        Isolation Precautions No active isolations  Medications Medications  ipratropium-albuterol (DUONEB) 0.5-2.5 (3) MG/3ML nebulizer solution 3 mL (3 mLs Nebulization Not Given 01/28/18 1941)  methylPREDNISolone sodium succinate (SOLU-MEDROL) 125 mg/2 mL injection 125 mg (125 mg Intravenous Given 01/28/18 1437)  albuterol (PROVENTIL) (2.5 MG/3ML) 0.083% nebulizer solution 5 mg (5 mg Nebulization Given 01/28/18 1439)  ipratropium (ATROVENT) nebulizer solution 0.5 mg (0.5 mg Nebulization Given 01/28/18 1439)  iopamidol (ISOVUE-370) 76 % injection (100 mLs  Contrast Given 01/28/18 1758)  magnesium sulfate IVPB 2 g 50 mL (0 g Intravenous Stopped 01/28/18 2001)  albuterol (PROVENTIL,VENTOLIN) solution continuous neb (10 mg/hr Nebulization Given 01/28/18 1940)    Mobility walks with device

## 2018-01-28 NOTE — Telephone Encounter (Signed)
Pt's occupational therapist, Lattie Haw from [facility] called to say that Ms. Fanelli's oxygen level was 88- 90.  Her B/P is 120/58 and HR  75,  Resp 24. She does have a productive cough with some wheezes. Pt states that she does not feel that good. Pt lives alone. Lattie Haw states that the patient is having a hard time opening up her meds for her breathing treatment and therefore have missed some treatments. She will assist her in opening up those meds and see that she gets a breathing treatment before she leaves. I will give her a call back and check to see how there O2 sat is after the treatments. According to protocol, she needs to be seen in the office within 24 hours. She was seen in the office on Tuesday. Will check with flow regarding appointment after I get results of the O2 sat after treatment.  Called Lisa back and the patient's oxygen level went up to 97% with the treatment then back down to 89. Pt is able to cough. Sounds like a congested cough. Lattie Haw attempted to notify her son, left a message on his phone. I also called son and left message to check on his mom. Pt is alert and she is able to call 911 if she gets into difficulty.

## 2018-01-28 NOTE — Telephone Encounter (Signed)
Okay 

## 2018-01-28 NOTE — Telephone Encounter (Signed)
Called [facility] and left message with orders as directed.

## 2018-01-28 NOTE — Telephone Encounter (Signed)
  Reason for Disposition . [1] MILD difficulty breathing  (e.g., minimal/no SOB at rest, SOB with walking) AND [2] worse than normal  Answer Assessment - Initial Assessment Questions 1. MAIN CONCERN OR SYMPTOM : "What's your main concern?" (e.g., low oxygen level, breathing difficulty) "What question do you have?"     Oxygen level down to below 2. ONSET: "When did the  ________  start?"      today 3. OXYGEN THERAPY:    - "Do you currently use home oxygen?" (e.g., yes, no).    - If yes, "What is your oxygen source?" (e.g., O2 tank, O2 concentrator).    - If yes, "How do you get the oxygen?" (e.g., nasal prongs, face mask).    - If yes, "How much oxygen are you supposed to use?" (e.g., 1-2 L [place])     Does not have oxygen 4. PULSE OXIMETER:    - "Do you have a pulse oximeter (pulse ox)?"  (e.g., yes, no)    - If yes, "Where do you place the probe?" (e.g., fingertip, ear lobe)     Yes, uses it on her fingertip 5. O2 MONITORING: "What is the oxygen level (pulse ox reading)?" (e.g., 70-100%) 6: VSS MONITORING "Do you monitor/measure your oxygen level or vital signs (e.g., yes, no, measurements are automatically sent to call center). Document CURRENT and NORMAL BASELINE values if available.     -  O2 SAT: "What is the oxygen level (pulse ox reading)?" (e.g., 70-100%)   -  P: "What is your pulse rate per minute?"   -  RR: "What is your respiratory rate per minute?"     B/P 120/58 and  HR 75  Resp 24 7. BREATHING DIFFICULTY: "Are you having any difficulty breathing?" If so, ask "How bad is it?"  (e.g., none, mild, moderate, severe)   - MILD: No SOB at rest, mild SOB with walking, speaks normally in sentences, able to lie down, no retractions, pulse < 100.   - MODERATE: SOB at rest, SOB with minimal exertion and prefers to sit, cannot lie down flat, speaks in phrases, mild retractions, audible wheezing, pulse 100-120.   - SEVERE: Very SOB at rest, speaks in single words, struggling to breathe,  sitting hunched forward, retractions, pulse > 120      moderate 8. OTHER SYMPTOMS: "Do you have any other symptoms?" (e.g., fever, change in sputum)     Wheezing a little . Temp 98.2, productive cough, sputum that is thick and milky color 9. SMOKING: "Do you smoke currently?" (Note: smoking around oxygen is dangerous!)     no  Protocols used: COPD OXYGEN MONITORING AND HYPOXIA-A-AH

## 2018-01-28 NOTE — Telephone Encounter (Signed)
Pt is currently in [facility] ED for evaluation.

## 2018-01-28 NOTE — ED Triage Notes (Signed)
Per EMS- Patient lives at home and a home Health nurse that was visiting for the first time called EMS for Low O2 sats-84%. When EMS arrived patient had sats-91% and when she took deep breaths her sats were 94%. Patient saw PCP 2 days ago for left hip pain.injury. Patient also c/o NP cough x 2 days and had vomiting x 2 days 4 days ago.

## 2018-01-28 NOTE — H&P (Addendum)
History and Physical    Heather [name] IDP:[id number] DOB: [birth year] DOA: 01/28/2018  PCP: [name], MD Consultants:  None Patient coming from: Home - lives alone; NOK: Son  Chief Complaint: SOB  HPI: Heather [name] is a 83 y.o. female with medical history significant of chronic pain; hypothyroidism; HTN; HLD; depression; COPD; and CAD presenting with SOB.  Symptoms started about 2 weeks ago.  She called to get an appointment but the doctor wasn't able to see her until last Tuesday.  When she saw the doctor, he did "nothing."  She was unable to get her medications, it was too early to fill the prescriptions.  +cough, productive of thick milky white sputum.  No fever.  No sick contacts.  Too weak to walk in her yard.   ED Course:  COPD exacerbation with new O2 requirement.  Intermittent cough and dyspnea x 1 week.  84% on RA this AM on OT evaluation.  2L [place] O2 here with desat to 83% in ER.  Duonebs, Mag, Solumedrol.  CTA negative for PE.  Review of Systems: As per HPI; otherwise review of systems reviewed and negative.   Ambulatory Status:   Ambulates with a cane  Past Medical History:  Diagnosis Date  . ANXIETY 07/23/2009  . Atherosclerosis of aorta ([place]) 07/24/2015  . Complication of anesthesia 7 or 8 yrs ago   woke up during colonscopy  . Coronary atherosclerosis 07/24/2015  . DEPRESSION 07/23/2009  . DIVERTICULOSIS, COLON 07/23/2009  . GERD 07/23/2009  . Headache(784.0) 07/23/2009   occasional  . Hemorrhoids   . HIP PAIN, LEFT 05/16/2010  . History of Crohn's disease   . HYPERLIPIDEMIA 07/23/2009  . HYPERTENSION 07/23/2009  . HYPOTHYROIDISM 07/23/2009  . IBS (irritable bowel syndrome)   . Insomnia   . LOW BACK PAIN, CHRONIC 10/01/2009  . PARESTHESIA 10/01/2009  . Shingles 2006   back    Past Surgical History:  Procedure Laterality Date  . ABDOMINAL HYSTERECTOMY  age 65 or 59  . APPENDECTOMY  yrs ago  . BILATERAL SALPINGOOPHORECTOMY  age 48 or 71  .  COLONOSCOPY N/A 11/17/2013   Procedure: COLONOSCOPY;  Surgeon: Cleotis Nipper, MD;  Location: [facility];  Service: Endoscopy;  Laterality: N/A;  . ESOPHAGOGASTRODUODENOSCOPY N/A 11/17/2013   Procedure: ESOPHAGOGASTRODUODENOSCOPY (EGD);  Surgeon: Cleotis Nipper, MD;  Location: [facility];  Service: Endoscopy;  Laterality: N/A;  . HEMORRHOID SURGERY  yrs ago  . TONSILLECTOMY  yrs ago    Social History   Socioeconomic History  . Marital status: Widowed    Spouse name: Not on file  . Number of children: 1  . Years of education: Not on file  . Highest education level: Not on file  Social Needs  . Financial resource strain: Not on file  . Food insecurity - worry: Not on file  . Food insecurity - inability: Not on file  . Transportation needs - medical: Not on file  . Transportation needs - non-medical: Not on file  Occupational History  . Not on file  Tobacco Use  . Smoking status: Never Smoker  . Smokeless tobacco: Never Used  . Tobacco comment: smoked rarely in youth  Substance and Sexual Activity  . Alcohol use: No    Alcohol/week: 0.0 oz  . Drug use: No  . Sexual activity: Not on file  Other Topics Concern  . Not on file  Social History Narrative   Widowed.  Lives alone in her own home.  Ambulates with  a cane/walker when needed.    Allergies  Allergen Reactions  . Tylenol [Acetaminophen] Other (See Comments)    Nightmares  . Symbicort [Budesonide-Formoterol Fumarate] Other (See Comments)    Pt felt like her tongue was swollen, could not swallow  . Duloxetine Other (See Comments)    Patient doesn't recall  . Metronidazole Other (See Comments)    Patient doesn't recall   . Rofecoxib Other (See Comments)    Patient doesn't recall     Family History  Problem Relation Age of Onset  . Cancer Mother        Unknown type  . COPD Sister   . Cancer Sister        Breast  . Clotting disorder Neg Hx     Prior to Admission medications   Medication Sig Start  Date End Date Taking? Authorizing Provider  albuterol (PROVENTIL) (2.5 MG/3ML) 0.083% nebulizer solution INHALE CONTENTS OF 1 AMP VIA NEBULIZER EVERY 6 HRS AS NEEDED FOR WHEEZING/ SHORTNESS OF BREATH. 12/17/17  Yes [name], MD  Albuterol Sulfate (PROAIR RESPICLICK) 627 (90 Base) MCG/ACT AEPB Inhale 2 puffs into the lungs 4 (four) times daily as needed. 01/26/18  Yes [name], MD  Cholecalciferol 2000 UNITS TABS Take 2,000 Units by mouth daily.   Yes [provider]  clorazepate (TRANXENE) 7.5 MG tablet Take 1 tablet (7.5 mg total) by mouth 2 (two) times daily as needed for anxiety. 01/10/18  Yes [name], MD  Dextromethorphan-guaiFENesin ([place] FAST-MAX DM MAX) 5-100 MG/5ML LIQD Take 15 mLs by mouth every 4 (four) hours as needed (chest congestion).   Yes [provider]  diltiazem (CARDIZEM CD) 240 MG 24 hr capsule TAKE (1) CAPSULE DAILY. 10/26/17  Yes [name], MD  esomeprazole (NEXIUM) 40 MG capsule TAKE 1 CAPSULE DAILY BEFORE BREAKFAST. 06/26/17  Yes [name], MD  levothyroxine (SYNTHROID, LEVOTHROID) 25 MCG tablet Take 1 tablet (25 mcg total) by mouth daily before breakfast. 06/26/17  Yes [name], MD  Polyethyl Glycol-Propyl Glycol (SYSTANE OP) Place 1 drop into both eyes 2 (two) times daily as needed (for dry eyes).    Yes [provider]  sertraline (ZOLOFT) 50 MG tablet Take 1 tablet (50 mg total) by mouth daily. 09/28/17  Yes [name], MD  tiotropium (SPIRIVA HANDIHALER) 18 MCG inhalation capsule Place 1 capsule (18 mcg total) into inhaler and inhale daily. 01/26/18  Yes [name], MD  traMADol (ULTRAM) 50 MG tablet Take 1 tablet (50 mg total) by mouth every 6 (six) hours as needed. 01/26/18  Yes [name], MD  traZODone (DESYREL) 50 MG tablet TAKE 1/2 TO 1 TABLET AT BEDTIME AS NEEDED FOR REST. 01/18/18  Yes [name], MD  docusate sodium (COLACE) 100 MG  capsule Take 1 capsule (100 mg total) by mouth 2 (two) times daily. Patient not taking: Reported on 01/28/2018 06/01/16   Bonnielee Haff, MD  feeding supplement, ENSURE ENLIVE, (ENSURE ENLIVE) LIQD Take 237 mLs by mouth 2 (two) times daily between meals. Patient not taking: Reported on 01/28/2018 07/25/15   Robbie Lis, MD  furosemide (LASIX) 20 MG tablet TAKE 1 TABLET DAILY IF NEEDED FOR SIGNIFICANT LOWER EXTREMITY SWELLING. Patient not taking: Reported on 01/28/2018 06/26/17   [name], MD  polyethylene glycol [facility] / Floria Raveling) packet Take 17 g by mouth 2 (two) times daily. Patient not taking: Reported on 01/28/2018 06/01/16   Bonnielee Haff, MD    Physical  Exam: Vitals:   01/28/18 2030 01/28/18 2130 01/28/18 2227 01/29/18 0143  BP: (!) 123/43 (!) 109/54 (!) 138/43   Pulse: 74 85 88   Resp: 16 15 16    Temp:   98.4 F (36.9 C)   TempSrc:   Oral   SpO2: 100% 94% 95% 96%  Weight:      Height:         General:  Appears calm and comfortable and is NAD; O2 sats off [place] O2 drifted into the 80s fairly quickly Eyes:  PERRL, EOMI, normal lids, iris ENT:  grossly normal hearing, lips & tongue, mmm; + oral thrush Neck:  no LAD, masses or thyromegaly; no carotid bruits Cardiovascular:  RRR, no m/r/g. No LE edema.  Respiratory:   CTA bilaterally with no wheezes/rales/rhonchi.  Normal respiratory effort.  She is just s/p continuous neb treatment. Abdomen:  soft, NT, ND, NABS Back:   normal alignment, no CVAT Skin:  no rash or induration seen on limited exam Musculoskeletal:  grossly normal tone BUE/BLE, good ROM, no bony abnormality Lower extremity:  No LE edema.  Limited foot exam with no ulcerations.  2+ distal pulses. Psychiatric:  grossly normal mood and affect, speech fluent and appropriate, AOx3 Neurologic:  CN 2-12 grossly intact, moves all extremities in coordinated fashion, sensation intact    Radiological Exams on Admission: Dg Chest 2 View  Result Date:  01/28/2018 CLINICAL DATA:  Cough. EXAM: CHEST  2 VIEW COMPARISON:  Radiographs of May 28, 2016. FINDINGS: The heart size and mediastinal contours are within normal limits. Both lungs are clear. No pneumothorax or pleural effusion is noted. The visualized skeletal structures are unremarkable. IMPRESSION: No active cardiopulmonary disease. Electronically Signed   By: Marijo Conception, M.D.   On: 01/28/2018 15:44   Ct Angio Chest Pe W And/or Wo Contrast  Result Date: 01/28/2018 CLINICAL DATA:  Low oxygen saturations.  Cough x2 days and vomiting. EXAM: CT ANGIOGRAPHY CHEST WITH CONTRAST TECHNIQUE: Multidetector CT imaging of the chest was performed using the standard protocol during bolus administration of intravenous contrast. Multiplanar CT image reconstructions and MIPs were obtained to evaluate the vascular anatomy. CONTRAST:  152mL ISOVUE-370 IOPAMIDOL (ISOVUE-370) INJECTION 76% COMPARISON:  07/23/2015 CT FINDINGS: Cardiovascular: Satisfactory opacification of the pulmonary arteries to the segmental level. No evidence of pulmonary embolism. Normal heart size. No pericardial effusion. Coronary arteriosclerosis is noted along the LAD. No aortic aneurysm. There is aortic atherosclerosis. Mediastinum/Nodes: No enlarged mediastinal, hilar, or axillary lymph nodes. Thyroid gland, trachea, and esophagus demonstrate no significant findings. Lungs/Pleura: Centrilobular emphysema of the lungs. No dominant mass or pneumonic consolidations. No effusion or pneumothorax. There is subsegmental atelectasis at the right lung base. Mild peribronchial thickening is seen to the right lower lobe where there is a 1 cm rounded opacity likely representing a small focus of mucous plugging. As a pulmonary nodule is not entirely excluded, short-term interval follow-up is recommended. Upper Abdomen: No acute abnormality. Musculoskeletal: No chest wall abnormality. No acute or significant osseous findings. Review of the MIP images confirms  the above findings. IMPRESSION: 1. Centrilobular emphysema with chronic bronchitic change and peribronchial thickening noted. Probable area of inspissated mucus in the right lower lobe accounting for a nodular 1 cm opacity seen, new since prior 2016 CT. Consider one of the following in 3 months for both low-risk and high-risk individuals: (a) repeat chest CT, (b) follow-up PET-CT, or (c) tissue sampling. This recommendation follows the consensus statement: Guidelines for Management of Incidental Pulmonary Nodules Detected  on CT Images: From the Fleischner Society 2017; Radiology 2017; 284:228-243. 2. No acute pulmonary embolus. 3. Coronary arteriosclerosis and aortic atherosclerosis. No aneurysm or dissection of the aorta. Aortic Atherosclerosis (ICD10-I70.0) and Emphysema (ICD10-J43.9). Electronically Signed   By: Ashley Royalty M.D.   On: 01/28/2018 18:41   Dg Hip Unilat With Pelvis 2-3 Views Left  Result Date: 01/28/2018 CLINICAL DATA:  Left hip pain. EXAM: DG HIP (WITH OR WITHOUT PELVIS) 2-3V LEFT COMPARISON:  None. FINDINGS: There is no evidence of hip fracture or dislocation. There is no evidence of arthropathy or other focal bone abnormality. IMPRESSION: Normal left hip. Electronically Signed   By: Marijo Conception, M.D.   On: 01/28/2018 15:46    EKG: Independently reviewed.  NSR with rate 71; nonspecific ST changes with no evidence of acute ischemia; NSCSLT   Labs on Admission: I have personally reviewed the available labs and imaging studies at the time of the admission.  Pertinent labs:   Glucose 110 BNP 128.7; 45.2 in 6/17 Troponin 0.00 Hgb 11.2, stable Flu negative Lipids 2/19: 234/82/130/112 TSH 2/19: 1.11   Assessment/Plan Principal Problem:   COPD with acute exacerbation (HCC) Active Problems:   Hypothyroidism   Anxiety and depression   Essential hypertension   Acute respiratory failure with hypoxia (HCC)   Oral thrush   Acute on chronic respiratory failure associated with  a COPD exacerbation -Patient's shortness of breath and productive cough are most likely caused by acute COPD exacerbation.  -She does not have fever or leukocytosis. Chest x-ray is not consistent with pneumonia.   -Chest CT is consistent with COPD but there is a nodular 1 cm opacity that will need repeat CT in 3 months. -She was given a continuous neb treatment in the ED with some improvement.   -will admit patient  -Nebulizers: scheduled Duoneb and prn albuterol -Solu-Medrol 60 mg IV BID  -IV Doxycycline (has 3/3 cardinal symptoms)   Depression/Anxiety -Continue Tranxene, Zoloft  HTN -Continue Cardizem  Hypothyroidism -Recent normal TSH -Continue Synthroid at current dose for now  Thrush -Will add Nystatin oral suspension    DVT prophylaxis: Lovenox  Code Status:  Full - confirmed with patient Family Communication: None present Disposition Plan:  Home once clinically improved Consults called: CM/SW/PT/OT/Nutrition/RT  Admission status: Admit - It is my clinical opinion that admission to INPATIENT is reasonable and necessary because this patient will require at least 2 midnights in the hospital to treat this condition based on the medical complexity of the problems presented.  Given the aforementioned information, the predictability of an adverse outcome is felt to be significant.   Karmen Bongo MD [facility]  If note is complete, please contact covering daytime or nighttime physician. [email or web address] Password [facility]  01/29/2018, 1:54 AM

## 2018-01-28 NOTE — Telephone Encounter (Signed)
Lattie Haw from [facility] is requesting an order for OT for this patient. She needs 1 week 1, and 2 week 6.

## 2018-01-28 NOTE — Telephone Encounter (Signed)
Message sent to Dr. Marthann Schiller nurse.

## 2018-01-28 NOTE — ED Notes (Signed)
Bed: WA13 Expected date:  Expected time:  Means of arrival:  Comments: EMS-SOB 

## 2018-01-28 NOTE — Telephone Encounter (Signed)
Occupational therapist called back and said that she had to call EMS because the patient's O2 sat has dropped to 79%. She was heading to the ED at [facility].

## 2018-01-29 DIAGNOSIS — E440 Moderate protein-calorie malnutrition: Secondary | ICD-10-CM

## 2018-01-29 DIAGNOSIS — E039 Hypothyroidism, unspecified: Secondary | ICD-10-CM

## 2018-01-29 DIAGNOSIS — E876 Hypokalemia: Secondary | ICD-10-CM

## 2018-01-29 DIAGNOSIS — J441 Chronic obstructive pulmonary disease with (acute) exacerbation: Principal | ICD-10-CM

## 2018-01-29 DIAGNOSIS — I10 Essential (primary) hypertension: Secondary | ICD-10-CM

## 2018-01-29 DIAGNOSIS — B370 Candidal stomatitis: Secondary | ICD-10-CM

## 2018-01-29 LAB — BASIC METABOLIC PANEL
Anion gap: 12 (ref 5–15)
BUN: 11 mg/dL (ref 6–20)
CO2: 25 mmol/L (ref 22–32)
Calcium: 9 mg/dL (ref 8.9–10.3)
Chloride: 102 mmol/L (ref 101–111)
Creatinine, Ser: 0.78 mg/dL (ref 0.44–1.00)
GFR calc Af Amer: >60 mL/min (ref >60)
GFR calc non Af Amer: >60 mL/min (ref >60)
Glucose, Bld: 281 mg/dL — ABNORMAL HIGH (ref 65–99)
Potassium: 2.5 mmol/L — CL (ref 3.5–5.1)
Sodium: 139 mmol/L (ref 135–145)

## 2018-01-29 LAB — CBC
HCT: 33.3 % — ABNORMAL LOW (ref 36.0–46.0)
Hemoglobin: 10.3 g/dL — ABNORMAL LOW (ref 12.0–15.0)
MCH: 28.2 pg (ref 26.0–34.0)
MCHC: 30.9 g/dL (ref 30.0–36.0)
MCV: 91.2 fL (ref 78.0–100.0)
Platelets: 425 10*3/uL — ABNORMAL HIGH (ref 150–400)
RBC: 3.65 MIL/uL — ABNORMAL LOW (ref 3.87–5.11)
RDW: 14.5 % (ref 11.5–15.5)
WBC: 8.3 10*3/uL (ref 4.0–10.5)

## 2018-01-29 MED ORDER — ENSURE ENLIVE PO LIQD: 237.0000 mL | ORAL | Status: DC | PRN

## 2018-01-29 MED ORDER — BISACODYL 10 MG RE SUPP
10.0000 mg | Freq: Once | RECTAL | Status: AC
  Administered 2018-01-29: 10 mg via RECTAL
  Filled 2018-01-29: qty 1

## 2018-01-29 MED ORDER — TRAZODONE HCL 50 MG PO TABS
50.0000 mg | ORAL_TABLET | Freq: Every evening | ORAL | Status: DC | PRN
  Administered 2018-01-29 – 2018-01-31 (×3): 50 mg via ORAL
  Filled 2018-01-29 (×3): qty 1

## 2018-01-29 MED ORDER — POTASSIUM CHLORIDE CRYS ER 20 MEQ PO TBCR
40.0000 meq | EXTENDED_RELEASE_TABLET | ORAL | Status: AC
  Administered 2018-01-29 (×2): 40 meq via ORAL
  Filled 2018-01-29 (×2): qty 2

## 2018-01-29 MED ORDER — METHYLPREDNISOLONE SODIUM SUCC 125 MG IJ SOLR
60.0000 mg | Freq: Four times a day (QID) | INTRAMUSCULAR | Status: DC
  Administered 2018-01-29 – 2018-02-01 (×12): 60 mg via INTRAVENOUS
  Filled 2018-01-29 (×12): qty 2

## 2018-01-29 MED ORDER — NYSTATIN 100000 UNIT/ML MT SUSP
5.0000 mL | Freq: Four times a day (QID) | OROMUCOSAL | Status: DC
  Administered 2018-01-29 – 2018-02-01 (×12): 500000 [IU] via ORAL
  Filled 2018-01-29 (×12): qty 5

## 2018-01-29 MED ORDER — TRAMADOL HCL 50 MG PO TABS
50.0000 mg | ORAL_TABLET | Freq: Four times a day (QID) | ORAL | Status: DC | PRN
  Administered 2018-01-29 – 2018-02-01 (×11): 50 mg via ORAL
  Filled 2018-01-29 (×11): qty 1

## 2018-01-29 MED ORDER — POTASSIUM CHLORIDE 10 MEQ/100ML IV SOLN
10.0000 meq | INTRAVENOUS | Status: AC
  Administered 2018-01-29 (×2): 10 meq via INTRAVENOUS
  Filled 2018-01-29 (×2): qty 100

## 2018-01-29 NOTE — Evaluation (Signed)
Clinical/Bedside Swallow Evaluation Patient Details  Name: Heather [name] MRN: [id number] Date of Birth: 08-Jan-1935  Today's Date: 01/29/2018 Time: SLP Start Time (ACUTE ONLY): 1225 SLP Stop Time (ACUTE ONLY): 1248 SLP Time Calculation (min) (ACUTE ONLY): 23 min  Past Medical History:  Past Medical History:  Diagnosis Date  . ANXIETY 07/23/2009  . Atherosclerosis of aorta ([place]) 07/24/2015  . Complication of anesthesia 7 or 8 yrs ago   woke up during colonscopy  . Coronary atherosclerosis 07/24/2015  . DEPRESSION 07/23/2009  . DIVERTICULOSIS, COLON 07/23/2009  . GERD 07/23/2009  . Headache(784.0) 07/23/2009   occasional  . Hemorrhoids   . HIP PAIN, LEFT 05/16/2010  . History of Crohn's disease   . HYPERLIPIDEMIA 07/23/2009  . HYPERTENSION 07/23/2009  . HYPOTHYROIDISM 07/23/2009  . IBS (irritable bowel syndrome)   . Insomnia   . LOW BACK PAIN, CHRONIC 10/01/2009  . PARESTHESIA 10/01/2009  . Shingles 2006   back   Past Surgical History:  Past Surgical History:  Procedure Laterality Date  . ABDOMINAL HYSTERECTOMY  age 63 or 43  . APPENDECTOMY  yrs ago  . BILATERAL SALPINGOOPHORECTOMY  age 13 or 10  . COLONOSCOPY N/A 11/17/2013   Procedure: COLONOSCOPY;  Surgeon: Cleotis Nipper, MD;  Location: [facility];  Service: Endoscopy;  Laterality: N/A;  . ESOPHAGOGASTRODUODENOSCOPY N/A 11/17/2013   Procedure: ESOPHAGOGASTRODUODENOSCOPY (EGD);  Surgeon: Cleotis Nipper, MD;  Location: [facility];  Service: Endoscopy;  Laterality: N/A;  . HEMORRHOID SURGERY  yrs ago  . TONSILLECTOMY  yrs ago   HPI:  Heather [name] a 83 y.o.femalewith medical history significant ofchronic pain; hypothyroidism; HTN; HLD; depression; COPD; and CAD presenting withSOB. Symptoms started about 2 weeks ago. Previously seen by SLP for BSE in 2017 revealing no s/s of aspiration and likely esphageal issues.     Assessment / Plan / Recommendation Clinical Impression   Pt presents with overtly  functional swallow function as indicated by lack of signs/symptoms concerning for aspiration with PO trials. Pt has extensive history of esophageal issues, which corresponds with pt's reported symptoms, including globus sensation and regurgitation. Pt educated on esophageal precuations: taking small.slow bites, alternating liquids and solids, eating more moist food, using hot liquids to facilitate esophageal clearance, and eating small meals throughout day, with pt reporting that she has heard of those precautions before. Following discussion, pt reported she was "choking," yet with further explanation suspect that bolus was stuck in esophagus. She used coffee to facilitate clearance with min verbal cues. Given presentation consistent with previous session, and pt's verification that she knows/follows strategies to aid in swallow comfort, SLP services not warranted. Please re-consult in the event of acute changes.   SLP Visit Diagnosis: Dysphagia, unspecified (R13.10)    Aspiration Risk  Mild aspiration risk    Diet Recommendation Regular;Thin liquid   Liquid Administration via: Straw;Cup Medication Administration: Whole meds with puree(as tolerated) Supervision: Intermittent supervision to cue for compensatory strategies;Staff to assist with self feeding Compensations: Slow rate;Small sips/bites;Follow solids with liquid;Minimize environmental distractions Postural Changes: Seated upright at 90 degrees;Remain upright for at least 30 minutes after po intake    Other  Recommendations Oral Care Recommendations: Oral care BID   Follow up Recommendations None      Frequency and Duration            Prognosis        Swallow Study   General HPI: Heather [name] a 83 y.o.femalewith medical history significant ofchronic pain; hypothyroidism; HTN; HLD; depression;  COPD; and CAD presenting withSOB. Symptoms started about 2 weeks ago. Previously seen by SLP for BSE in 2017 revealing no  s/s of aspiration and likely esphageal issues.   Type of Study: Bedside Swallow Evaluation Previous Swallow Assessment: See HPI Diet Prior to this Study: Regular;Thin liquids Temperature Spikes Noted: No Respiratory Status: Nasal cannula History of Recent Intubation: No Behavior/Cognition: Alert;Cooperative;Pleasant mood Oral Cavity Assessment: Within Functional Limits Oral Care Completed by SLP: No Oral Cavity - Dentition: Adequate natural dentition Vision: Functional for self-feeding Self-Feeding Abilities: Needs set up Patient Positioning: Upright in bed Baseline Vocal Quality: Normal Volitional Cough: Strong Volitional Swallow: Able to elicit    Oral/Motor/Sensory Function Overall Oral Motor/Sensory Function: Within functional limits   Ice Chips Ice chips: Not tested   Thin Liquid Thin Liquid: Within functional limits Presentation: Straw    Nectar Thick Nectar Thick Liquid: Not tested   Honey Thick Honey Thick Liquid: Not tested   Puree Puree: Not tested   Solid   GO   Solid: Within functional limits Presentation: Self Fed;Spoon        Heather [name] 01/29/2018,1:51 PM

## 2018-01-29 NOTE — Evaluation (Signed)
Occupational Therapy Evaluation Patient Details Name: Heather [name] MRN: [id number] DOB: [birth year] Today's Date: 01/29/2018    History of Present Illness Heather [name] is a 83 y.o. female with medical history significant of chronic pain; hypothyroidism; HTN; HLD; depression; COPD; and CAD presenting with SOB   Clinical Impression   Pt admitted with SOB. Pt currently with functional limitations due to the deficits listed below (see OT Problem List).  Pt will benefit from skilled OT to increase their safety and independence with ADL and functional mobility for ADL to facilitate discharge to venue listed below.      Follow Up Recommendations  SNF    Equipment Recommendations  None recommended by OT    Recommendations for Other Services       Precautions / Restrictions Precautions Precautions: Fall      Mobility Bed Mobility Overal bed mobility: Needs Assistance Bed Mobility: Sit to Supine       Sit to supine: Mod assist      Transfers Overall transfer level: Needs assistance Equipment used: 1 person hand held assist Transfers: Sit to/from [employer] Transfers Sit to Stand: Mod assist Stand pivot transfers: Mod assist                ADL either performed or assessed with clinical judgement   ADL Overall ADL's : Needs assistance/impaired Eating/Feeding: Sitting;Set up   Grooming: Set up;Sitting   Upper Body Bathing: Minimal assistance;Sitting   Lower Body Bathing: Sit to/from stand;Cueing for safety;Cueing for sequencing;Cueing for compensatory techniques;Moderate assistance       Lower Body Dressing: Minimal assistance;Sit to/from stand;Cueing for safety;Cueing for sequencing   Toilet Transfer: Moderate assistance;Stand-pivot Toilet Transfer Details (indicate cue type and reason): bed to chair Toileting- Clothing Manipulation and Hygiene: Maximal assistance;Sit to/from stand;Cueing for safety;Cueing for sequencing          General ADL Comments: pt fatigued very easily.  discussed possible need for [facility].  Pt resistant but know she needs A     Vision Patient Visual Report: No change from baseline              Pertinent Vitals/Pain Pain Assessment: Faces Pain Score: 4  Faces Pain Scale: Hurts little more Pain Location: IV in right arm Pain Descriptors / Indicators: Discomfort;Aching Pain Intervention(s): Limited activity within patient's tolerance;Monitored during session     Hand Dominance     Extremity/Trunk Assessment Upper Extremity Assessment Upper Extremity Assessment: Generalized weakness           Communication Communication Communication: No difficulties   Cognition Arousal/Alertness: Awake/alert Behavior During Therapy: WFL for tasks assessed/performed Overall Cognitive Status: Within Functional Limits for tasks assessed                                                Home Living Family/patient expects to be discharged to:: Private residence Living Arrangements: Alone   Type of Home: House Home Access: Stairs to enter     Home Layout: One level     Bathroom Shower/Tub: Walk-in shower         Home Equipment: Environmental consultant - 2 wheels;Cane - quad;Shower seat;Hand held shower head          Prior Functioning/Environment Level of Independence: Independent with assistive device(s)                 OT  Problem List: Decreased strength;Decreased activity tolerance;Impaired balance (sitting and/or standing);Decreased safety awareness;Decreased knowledge of precautions;Decreased knowledge of use of DME or AE      OT Treatment/Interventions: Self-care/ADL training;Patient/family education;DME and/or AE instruction    OT Goals(Current goals can be found in the care plan section) Acute Rehab OT Goals Patient Stated Goal: get stronger OT Goal Formulation: With patient Time For Goal Achievement: 02/12/18 Potential to Achieve Goals: Good  OT Frequency:  Min 2X/week    AM-PAC PT "6 Clicks" Daily Activity     Outcome Measure Help from another person eating meals?: A Little Help from another person taking care of personal grooming?: A Little Help from another person toileting, which includes using toliet, bedpan, or urinal?: A Lot Help from another person bathing (including washing, rinsing, drying)?: A Lot Help from another person to put on and taking off regular upper body clothing?: A Little Help from another person to put on and taking off regular lower body clothing?: A Lot 6 Click Score: 15   End of Session Nurse Communication: Mobility status  Activity Tolerance: Patient limited by fatigue Patient left: in chair;with call bell/phone within reach;with nursing/sitter in room  OT Visit Diagnosis: Unsteadiness on feet (R26.81)                Time: 4680-3212 OT Time Calculation (min): 24 min Charges:  OT General Charges $OT Visit: 1 Visit OT Evaluation $OT Eval Moderate Complexity: 1 Mod OT Treatments $Self Care/Home Management : 8-22 mins G-Codes:     Kari Baars, OT [phone]  Payton Mccallum D 01/29/2018, 1:43 PM

## 2018-01-29 NOTE — Progress Notes (Addendum)
Initial Nutrition Assessment  DOCUMENTATION CODES:   Non-severe (moderate) malnutrition in context of chronic illness  INTERVENTION:   - Ensure Enlive po PRN, each supplement provides 350 kcal and 20 grams of protein - Encouraged PO intake  NUTRITION DIAGNOSIS:   Moderate Malnutrition related to chronic illness(COPD, CAD) as evidenced by mild muscle depletion, mild fat depletion.  GOAL:   Patient will meet greater than or equal to 90% of their needs  MONITOR:   PO intake, Supplement acceptance, Labs  REASON FOR ASSESSMENT:   Consult, Malnutrition Screening Tool COPD Protocol  ASSESSMENT:   83 year old female with PMH significant for COPD, CAD, anxiety, and IBS who presented to ED with SOB.  Spoke with pt at bedside who reports having a hard time eating and decreased appetite for a "long time" due to nausea. Pt states that when she takes her nausea medication prior to eating, she is able to eat well. Pt states that she eats 2 meals daily and knows she is "supposed to snack in between meals" but does not because she is not hungry. Noted meal completion 50-100% in chart. Most recent meal completion listed as 100%.  Pt states that her UBW is 112 lbs and that she has lost weight. Per weight history in chart, pt's weight has been stable between 107-115 lbs since July 2017.  Medications reviewed and include: 100 mg Colace BID, 25 mcg levothyroxine, 40 mg Protonix, 40 mEq KCl every 4 hours  Labs reviewed: potassium 2.5 (L), glucose 281 (H)  NUTRITION - FOCUSED PHYSICAL EXAM:    Most Recent Value  Orbital Region  Mild depletion  Upper Arm Region  No depletion  Thoracic and Lumbar Region  No depletion  Buccal Region  No depletion  Temple Region  Mild depletion  Clavicle Bone Region  No depletion  Clavicle and Acromion Bone Region  No depletion  Scapular Bone Region  Unable to assess  Dorsal Hand  Mild depletion  Patellar Region  Mild depletion  Anterior Thigh Region  Mild  depletion  Posterior Calf Region  Mild depletion  Edema (RD Assessment)  None  Hair  Reviewed  Eyes  Reviewed  Mouth  Reviewed  Skin  Reviewed  Nails  Reviewed       Diet Order:  Diet regular Room service appropriate? Yes; Fluid consistency: Thin  EDUCATION NEEDS:   No education needs have been identified at this time  Skin:  Skin Assessment: Reviewed RN Assessment  Last BM:  01/29/18 small type 2  Height:   Ht Readings from Last 1 Encounters:  01/28/18 4\' 11"  (1.499 m)    Weight:   Wt Readings from Last 1 Encounters:  01/28/18 108 lb (49 kg)    Ideal Body Weight:  43.2 kg  BMI:  Body mass index is 21.81 kg/m.  Estimated Nutritional Needs:   Kcal:  1200-1400 kcal/day  Protein:  55-65 grams/day  Fluid:  > 1.5 L/day    Gaynell Face, MS, RD, LDN Pager: [phone] Weekend/After Hours: [phone]

## 2018-01-29 NOTE — Progress Notes (Signed)
CSW received consult for COPD Gold Protocol, though patient does not meet qualifications (only 1 admission within the past 6 months).    CSW signing off.   Jimie Kuwahara, LSCW [facility] [phone]   

## 2018-01-29 NOTE — Progress Notes (Signed)
PT Cancellation Note  Patient Details Name: Heather [name] MRN: [id number] DOB: [birth year]   Cancelled Treatment:    Reason Eval/Treat Not Completed: Fatigue/lethargy limiting ability to participate. Pt politely declined participation on today. She was agreeable to PT checking back another day to perform evaluation.    Weston Anna, MPT Pager: [phone]

## 2018-01-29 NOTE — Progress Notes (Signed)
CM consult for COPD Gold protocol. Pt does not qualify for COPD Gold as has not had any previous admissions this past year. Pt is already active with [facility] for PT/OT services. Could potentially add RN if needed. CM will continue to follow. Marney Doctor RN,BSN,NCM [phone]

## 2018-01-29 NOTE — Progress Notes (Signed)
[facility]  PROGRESS NOTE  Heather [name] SJG:[id number] DOB: 1935/05/10 DOA: 01/28/2018 PCP: [name], MD   Brief HPI:    83 y.o. female with medical history significant of chronic pain; hypothyroidism; HTN; HLD; depression; COPD; and CAD presenting with SOB.  Symptoms started about 2 weeks ago.  She called to get an appointment but the doctor wasn't able to see her until last Tuesday.  When she saw the doctor, he did "nothing."  She was unable to get her medications, it was too early to fill the prescriptions.  +cough, productive of thick milky white sputum.  No fever.  No sick contacts.  Too weak to walk in her yard.     Subjective   Patient seen and examined, complains of mild shortness of breath.   Assessment/Plan:     1. Acute on chronic hypoxic respiratory failure- secondary to COPD exacerbation, continue Solu-Medrol 60 mg IV Q6 hours, Atrovent/albuterol nebulizers Q6 hour, potassium DM Q4 hours PRN. Continue doxycycline 100 milligrams IV Q 12 hour. 2. Hypokalemia-potassium 2.5, will replace potassium and check BMP in am. 3. Depression/anxiety-continue Zoloft 4. Hypertension-blood pressure is stable, continue Cardizem 5. Hypothyroidism-recent TSH was normal, continue Synthroid 6. Oral thrush - continue nystatin oral suspension    DVT prophylaxis: Lovenox  Code Status: full code  Family Communication: no family at bedside  Disposition Plan: likely home in 2 to 3 days   Consultants:  none  Procedures:  none  Continuous infusions . doxycycline (VIBRAMYCIN) IV Stopped (01/29/18 1130)  . potassium chloride 10 mEq (01/29/18 1330)      Antibiotics:   Anti-infectives (From admission, onward)   Start     Dose/Rate Route Frequency Ordered Stop   01/28/18 2230  doxycycline (VIBRAMYCIN) 100 mg in sodium chloride 0.9 % 250 mL IVPB     100 mg 125 mL/hr over 120 Minutes Intravenous Every 12 hours 01/28/18 2225         Objective    Vitals:   01/29/18 0932 01/29/18 0939 01/29/18 1100 01/29/18 1213  BP: (!) 121/49     Pulse:      Resp:      Temp:      TempSrc:      SpO2:  (!) 88% 91% 92%  Weight:      Height:        Intake/Output Summary (Last 24 hours) at 01/29/2018 1420 Last data filed at 01/29/2018 6546 Gross per 24 hour  Intake 660 ml  Output -  Net 660 ml   Filed Weights   01/28/18 1231  Weight: 49 kg (108 lb)     Physical Examination:   Physical Exam: Eyes: No icterus, extraocular muscles intact  Mouth: Oral mucosa is moist, no lesions on palate,  Neck: Supple, no deformities, masses, or tenderness Lungs: Normal respiratory effort, bilateral wheezing Heart: Regular rate and rhythm, S1 and S2 normal, no murmurs, rubs auscultated Abdomen: BS normoactive,soft,nondistended,non-tender to palpation,no organomegaly Extremities: No pretibial edema, no erythema, no cyanosis, no clubbing Neuro : Alert and oriented to time, place and person, No focal deficits Skin: No rashes seen on exam     Data Reviewed: I have personally reviewed following labs and imaging studies  CBG: No results for input(s): GLUCAP in the last 168 hours.  CBC: Recent Labs  Lab 01/26/18 1322 01/28/18 1422 01/28/18 1435 01/29/18 0551  WBC 8.0 6.8  --  8.3  NEUTROABS 5.1 4.8  --   --   HGB 11.4* 11.2* 11.9* 10.3*  HCT 35.6* 36.1 35.0* 33.3*  MCV 88.1 92.1  --  91.2  PLT 441.0* 406*  --  425*    Basic Metabolic Panel: Recent Labs  Lab 01/26/18 1322 01/28/18 1422 01/28/18 1435 01/29/18 0551  NA 140 140 140 139  K 4.2 3.4* 3.5 2.5*  CL 100 101 98* 102  CO2 34* 27  --  25  GLUCOSE 105* 110* 107* 281*  BUN 8 9 8 11   CREATININE 0.77 0.69 0.60 0.78  CALCIUM 9.7 9.1  --  9.0    No results found for this or any previous visit (from the past 240 hour(s)).   Liver Function Tests: Recent Labs  Lab 01/26/18 1322  AST 14  ALT 8  ALKPHOS 87  BILITOT 0.5  PROT 6.7  ALBUMIN 4.1   No results for input(s):  LIPASE, AMYLASE in the last 168 hours. No results for input(s): AMMONIA in the last 168 hours.  Cardiac Enzymes: No results for input(s): CKTOTAL, CKMB, CKMBINDEX, TROPONINI in the last 168 hours. BNP (last 3 results) Recent Labs    01/28/18 1422  BNP 128.7*    ProBNP (last 3 results) No results for input(s): PROBNP in the last 8760 hours.    Studies: Dg Chest 2 View  Result Date: 01/28/2018 CLINICAL DATA:  Cough. EXAM: CHEST  2 VIEW COMPARISON:  Radiographs of May 28, 2016. FINDINGS: The heart size and mediastinal contours are within normal limits. Both lungs are clear. No pneumothorax or pleural effusion is noted. The visualized skeletal structures are unremarkable. IMPRESSION: No active cardiopulmonary disease. Electronically Signed   By: Marijo Conception, M.D.   On: 01/28/2018 15:44   Ct Angio Chest Pe W And/or Wo Contrast  Result Date: 01/28/2018 CLINICAL DATA:  Low oxygen saturations.  Cough x2 days and vomiting. EXAM: CT ANGIOGRAPHY CHEST WITH CONTRAST TECHNIQUE: Multidetector CT imaging of the chest was performed using the standard protocol during bolus administration of intravenous contrast. Multiplanar CT image reconstructions and MIPs were obtained to evaluate the vascular anatomy. CONTRAST:  135mL ISOVUE-370 IOPAMIDOL (ISOVUE-370) INJECTION 76% COMPARISON:  07/23/2015 CT FINDINGS: Cardiovascular: Satisfactory opacification of the pulmonary arteries to the segmental level. No evidence of pulmonary embolism. Normal heart size. No pericardial effusion. Coronary arteriosclerosis is noted along the LAD. No aortic aneurysm. There is aortic atherosclerosis. Mediastinum/Nodes: No enlarged mediastinal, hilar, or axillary lymph nodes. Thyroid gland, trachea, and esophagus demonstrate no significant findings. Lungs/Pleura: Centrilobular emphysema of the lungs. No dominant mass or pneumonic consolidations. No effusion or pneumothorax. There is subsegmental atelectasis at the right lung base.  Mild peribronchial thickening is seen to the right lower lobe where there is a 1 cm rounded opacity likely representing a small focus of mucous plugging. As a pulmonary nodule is not entirely excluded, short-term interval follow-up is recommended. Upper Abdomen: No acute abnormality. Musculoskeletal: No chest wall abnormality. No acute or significant osseous findings. Review of the MIP images confirms the above findings. IMPRESSION: 1. Centrilobular emphysema with chronic bronchitic change and peribronchial thickening noted. Probable area of inspissated mucus in the right lower lobe accounting for a nodular 1 cm opacity seen, new since prior 2016 CT. Consider one of the following in 3 months for both low-risk and high-risk individuals: (a) repeat chest CT, (b) follow-up PET-CT, or (c) tissue sampling. This recommendation follows the consensus statement: Guidelines for Management of Incidental Pulmonary Nodules Detected on CT Images: From the Fleischner Society 2017; Radiology 2017; 284:228-243. 2. No acute pulmonary embolus. 3. Coronary arteriosclerosis and aortic  atherosclerosis. No aneurysm or dissection of the aorta. Aortic Atherosclerosis (ICD10-I70.0) and Emphysema (ICD10-J43.9). Electronically Signed   By: Ashley Royalty M.D.   On: 01/28/2018 18:41   Dg Hip Unilat With Pelvis 2-3 Views Left  Result Date: 01/28/2018 CLINICAL DATA:  Left hip pain. EXAM: DG HIP (WITH OR WITHOUT PELVIS) 2-3V LEFT COMPARISON:  None. FINDINGS: There is no evidence of hip fracture or dislocation. There is no evidence of arthropathy or other focal bone abnormality. IMPRESSION: Normal left hip. Electronically Signed   By: Marijo Conception, M.D.   On: 01/28/2018 15:46    Scheduled Meds: . diltiazem  240 mg Oral Daily  . docusate sodium  100 mg Oral BID  . enoxaparin (LOVENOX) injection  40 mg Subcutaneous QHS  . ipratropium-albuterol  3 mL Nebulization QID  . levothyroxine  25 mcg Oral QAC breakfast  . methylPREDNISolone  (SOLU-MEDROL) injection  60 mg Intravenous Q12H  . nystatin  5 mL Oral QID  . pantoprazole  40 mg Oral Daily  . potassium chloride  40 mEq Oral Q4H  . sertraline  50 mg Oral Daily      Time spent: 25 min  [facility] Pager [phone]. If 7PM-7AM, please contact night-coverage at [email or web address], Office  [phone]  password [email or web address]  01/29/2018, 2:20 PM  LOS: 1 day

## 2018-01-29 NOTE — Progress Notes (Signed)
CRITICAL VALUE ALERT  Critical Value:  k 2.5  Date & Time Notied:  0658  Provider Notified: Yes  Orders Received/Actions taken: pending

## 2018-01-30 LAB — CBC
HCT: 32.4 % — ABNORMAL LOW (ref 36.0–46.0)
Hemoglobin: 10.1 g/dL — ABNORMAL LOW (ref 12.0–15.0)
MCH: 28.5 pg (ref 26.0–34.0)
MCHC: 31.2 g/dL (ref 30.0–36.0)
MCV: 91.5 fL (ref 78.0–100.0)
Platelets: 442 10*3/uL — ABNORMAL HIGH (ref 150–400)
RBC: 3.54 MIL/uL — ABNORMAL LOW (ref 3.87–5.11)
RDW: 14.9 % (ref 11.5–15.5)
WBC: 23.9 10*3/uL — ABNORMAL HIGH (ref 4.0–10.5)

## 2018-01-30 LAB — BASIC METABOLIC PANEL
Anion gap: 9 (ref 5–15)
BUN: 11 mg/dL (ref 6–20)
CO2: 25 mmol/L (ref 22–32)
Calcium: 8.9 mg/dL (ref 8.9–10.3)
Chloride: 107 mmol/L (ref 101–111)
Creatinine, Ser: 0.61 mg/dL (ref 0.44–1.00)
GFR calc Af Amer: >60 mL/min (ref >60)
GFR calc non Af Amer: >60 mL/min (ref >60)
Glucose, Bld: 156 mg/dL — ABNORMAL HIGH (ref 65–99)
Potassium: 5 mmol/L (ref 3.5–5.1)
Sodium: 141 mmol/L (ref 135–145)

## 2018-01-30 MED ORDER — DOXYCYCLINE HYCLATE 100 MG PO TABS
100.0000 mg | ORAL_TABLET | Freq: Two times a day (BID) | ORAL | Status: DC
  Administered 2018-01-30 – 2018-02-01 (×5): 100 mg via ORAL
  Filled 2018-01-30 (×5): qty 1

## 2018-01-30 NOTE — Progress Notes (Signed)
[facility]  PROGRESS NOTE  Heather [name] QHU:[id number] DOB: [birth year] DOA: 01/28/2018 PCP: [name], MD   Brief HPI:    83 y.o. female with medical history significant of chronic pain; hypothyroidism; HTN; HLD; depression; COPD; and CAD presenting with SOB.  Symptoms started about 2 weeks ago.  She called to get an appointment but the doctor wasn't able to see her until last Tuesday.  When she saw the doctor, he did "nothing."  She was unable to get her medications, it was too early to fill the prescriptions.  +cough, productive of thick milky white sputum.  No fever.  No sick contacts.  Too weak to walk in her yard.     Subjective   Patient seen and examined, still has mild wheezing bilaterally.   Assessment/Plan:     1. Acute on chronic hypoxic respiratory failure-slowly improving, secondary to COPD exacerbation, continue Solu-Medrol 60 mg IV Q6 hours, Atrovent/albuterol nebulizers Q6 hour, potassium DM Q4 hours PRN. Continue doxycycline. will change Doxy to PO 2. Hypokalemia-replete 3. Depression/anxiety-continue Zoloft 4. Hypertension-blood pressure is stable, continue Cardizem 5. Hypothyroidism-recent TSH was normal, continue Synthroid 6. Oral thrush - continue nystatin oral suspension 7. Leukocytosis-WBC is 23,000, likely from Solu-Medrol. Follow CBC in a.m.    DVT prophylaxis: Lovenox  Code Status: full code  Family Communication: no family at bedside  Disposition Plan: likely home in 2 to 3 days   Consultants:  none  Procedures:  none  Continuous infusions     Antibiotics:   Anti-infectives (From admission, onward)   Start     Dose/Rate Route Frequency Ordered Stop   01/30/18 1000  doxycycline (VIBRA-TABS) tablet 100 mg     100 mg Oral Every 12 hours 01/30/18 0836     01/28/18 2230  doxycycline (VIBRAMYCIN) 100 mg in sodium chloride 0.9 % 250 mL IVPB  Status:  Discontinued     100 mg 125 mL/hr over 120 Minutes Intravenous  Every 12 hours 01/28/18 2225 01/30/18 0836       Objective   Vitals:   01/29/18 2101 01/30/18 0616 01/30/18 0858 01/30/18 0903  BP:  (!) 121/58  (!) 115/43  Pulse:  80  70  Resp:  15  20  Temp:  98.2 F (36.8 C)  98.2 F (36.8 C)  TempSrc:  Oral  Oral  SpO2: 93% 91% 95% 95%  Weight:      Height:        Intake/Output Summary (Last 24 hours) at 01/30/2018 1108 Last data filed at 01/30/2018 1000 Gross per 24 hour  Intake 680 ml  Output 1350 ml  Net -670 ml   Filed Weights   01/28/18 1231  Weight: 49 kg (108 lb)     Physical Examination:   Physical Exam: Eyes: No icterus, extraocular muscles intact  Mouth: Oral mucosa is moist, no lesions on palate,  Neck: Supple, no deformities, masses, or tenderness Lungs: Normal respiratory effort, bilateral wheezing  Heart: Regular rate and rhythm, S1 and S2 normal, no murmurs, rubs auscultated Abdomen: BS normoactive,soft,nondistended,non-tender to palpation,no organomegaly Extremities: No pretibial edema, no erythema, no cyanosis, no clubbing Neuro : Alert and oriented to time, place and person, No focal deficits Skin: No rashes seen on exam      Data Reviewed: I have personally reviewed following labs and imaging studies  CBG: No results for input(s): GLUCAP in the last 168 hours.  CBC: Recent Labs  Lab 01/26/18 1322 01/28/18 1422 01/28/18 1435 01/29/18 0551 01/30/18 4656  WBC 8.0 6.8  --  8.3 23.9*  NEUTROABS 5.1 4.8  --   --   --   HGB 11.4* 11.2* 11.9* 10.3* 10.1*  HCT 35.6* 36.1 35.0* 33.3* 32.4*  MCV 88.1 92.1  --  91.2 91.5  PLT 441.0* 406*  --  425* 442*    Basic Metabolic Panel: Recent Labs  Lab 01/26/18 1322 01/28/18 1422 01/28/18 1435 01/29/18 0551 01/30/18 0626  NA 140 140 140 139 141  K 4.2 3.4* 3.5 2.5* 5.0  CL 100 101 98* 102 107  CO2 34* 27  --  25 25  GLUCOSE 105* 110* 107* 281* 156*  BUN 8 9 8 11 11   CREATININE 0.77 0.69 0.60 0.78 0.61  CALCIUM 9.7 9.1  --  9.0 8.9    No  results found for this or any previous visit (from the past 240 hour(s)).   Liver Function Tests: Recent Labs  Lab 01/26/18 1322  AST 14  ALT 8  ALKPHOS 87  BILITOT 0.5  PROT 6.7  ALBUMIN 4.1   No results for input(s): LIPASE, AMYLASE in the last 168 hours. No results for input(s): AMMONIA in the last 168 hours.  Cardiac Enzymes: No results for input(s): CKTOTAL, CKMB, CKMBINDEX, TROPONINI in the last 168 hours. BNP (last 3 results) Recent Labs    01/28/18 1422  BNP 128.7*    ProBNP (last 3 results) No results for input(s): PROBNP in the last 8760 hours.    Studies: Dg Chest 2 View  Result Date: 01/28/2018 CLINICAL DATA:  Cough. EXAM: CHEST  2 VIEW COMPARISON:  Radiographs of May 28, 2016. FINDINGS: The heart size and mediastinal contours are within normal limits. Both lungs are clear. No pneumothorax or pleural effusion is noted. The visualized skeletal structures are unremarkable. IMPRESSION: No active cardiopulmonary disease. Electronically Signed   By: Marijo Conception, M.D.   On: 01/28/2018 15:44   Ct Angio Chest Pe W And/or Wo Contrast  Result Date: 01/28/2018 CLINICAL DATA:  Low oxygen saturations.  Cough x2 days and vomiting. EXAM: CT ANGIOGRAPHY CHEST WITH CONTRAST TECHNIQUE: Multidetector CT imaging of the chest was performed using the standard protocol during bolus administration of intravenous contrast. Multiplanar CT image reconstructions and MIPs were obtained to evaluate the vascular anatomy. CONTRAST:  166mL ISOVUE-370 IOPAMIDOL (ISOVUE-370) INJECTION 76% COMPARISON:  07/23/2015 CT FINDINGS: Cardiovascular: Satisfactory opacification of the pulmonary arteries to the segmental level. No evidence of pulmonary embolism. Normal heart size. No pericardial effusion. Coronary arteriosclerosis is noted along the LAD. No aortic aneurysm. There is aortic atherosclerosis. Mediastinum/Nodes: No enlarged mediastinal, hilar, or axillary lymph nodes. Thyroid gland, trachea, and  esophagus demonstrate no significant findings. Lungs/Pleura: Centrilobular emphysema of the lungs. No dominant mass or pneumonic consolidations. No effusion or pneumothorax. There is subsegmental atelectasis at the right lung base. Mild peribronchial thickening is seen to the right lower lobe where there is a 1 cm rounded opacity likely representing a small focus of mucous plugging. As a pulmonary nodule is not entirely excluded, short-term interval follow-up is recommended. Upper Abdomen: No acute abnormality. Musculoskeletal: No chest wall abnormality. No acute or significant osseous findings. Review of the MIP images confirms the above findings. IMPRESSION: 1. Centrilobular emphysema with chronic bronchitic change and peribronchial thickening noted. Probable area of inspissated mucus in the right lower lobe accounting for a nodular 1 cm opacity seen, new since prior 2016 CT. Consider one of the following in 3 months for both low-risk and high-risk individuals: (a) repeat chest CT, (  b) follow-up PET-CT, or (c) tissue sampling. This recommendation follows the consensus statement: Guidelines for Management of Incidental Pulmonary Nodules Detected on CT Images: From the Fleischner Society 2017; Radiology 2017; 284:228-243. 2. No acute pulmonary embolus. 3. Coronary arteriosclerosis and aortic atherosclerosis. No aneurysm or dissection of the aorta. Aortic Atherosclerosis (ICD10-I70.0) and Emphysema (ICD10-J43.9). Electronically Signed   By: Ashley Royalty M.D.   On: 01/28/2018 18:41   Dg Hip Unilat With Pelvis 2-3 Views Left  Result Date: 01/28/2018 CLINICAL DATA:  Left hip pain. EXAM: DG HIP (WITH OR WITHOUT PELVIS) 2-3V LEFT COMPARISON:  None. FINDINGS: There is no evidence of hip fracture or dislocation. There is no evidence of arthropathy or other focal bone abnormality. IMPRESSION: Normal left hip. Electronically Signed   By: Marijo Conception, M.D.   On: 01/28/2018 15:46    Scheduled Meds: . diltiazem  240  mg Oral Daily  . docusate sodium  100 mg Oral BID  . doxycycline  100 mg Oral Q12H  . enoxaparin (LOVENOX) injection  40 mg Subcutaneous QHS  . ipratropium-albuterol  3 mL Nebulization QID  . levothyroxine  25 mcg Oral QAC breakfast  . methylPREDNISolone (SOLU-MEDROL) injection  60 mg Intravenous Q6H  . nystatin  5 mL Oral QID  . pantoprazole  40 mg Oral Daily  . sertraline  50 mg Oral Daily      Time spent: 25 min  [facility] Pager [phone]. If 7PM-7AM, please contact night-coverage at [email or web address], Office  [phone]  password [email or web address]  01/30/2018, 11:08 AM  LOS: 2 days

## 2018-01-30 NOTE — Evaluation (Signed)
Physical Therapy Evaluation Patient Details Name: Heather [name] MRN: [id number] DOB: 10-28-1935 Today's Date: 01/30/2018   History of Present Illness  NYNA CHILTON is a 83 y.o. female with medical history significant of chronic pain; hypothyroidism; HTN; HLD; depression; COPD; and CAD presenting with SOB.  pt with COPD exacerbation.  Clinical Impression  Pt walked in halls with RW.  Pt abel to keep O2 sats >92% on room air with cues.  Pt did have a lot of coughing with exercise.  Pt does not want any SNF care. She is weaker than her normal -she may need to hire extra assitance for few days when she gets home - encouraged hr to think about this. She needs to walk with nursing over next day to help her get her strength (I feel like she will recover her strength quickly if gets more active).  If not may need short term SNF as home alone. She VU    Follow Up Recommendations Home health PT;Supervision for mobility/OOB    Equipment Recommendations  None recommended by PT    Recommendations for Other Services       Precautions / Restrictions Precautions Precautions: Fall Restrictions Other Position/Activity Restrictions: pt reports a recent fall when she fell out of the chair.  she had bad habit of not reaching back when sitting - emphasized this in PT session today      Mobility  Bed Mobility Overal bed mobility: Needs Assistance Bed Mobility: Supine to Sit     Supine to sit: Min assist;HOB elevated     General bed mobility comments: pt needed cues for getting out of bed and hand placement  Transfers Overall transfer level: Needs assistance Equipment used: Rolling walker (2 wheeled) Transfers: Sit to/from Stand Sit to Stand: Min assist         General transfer comment: focused on cues - of pushing up from what she was sitting on and reaching back before sitting - she needed max assist every time.  no follow through of instruction during  session  Ambulation/Gait Ambulation/Gait assistance: Min assist Ambulation Distance (Feet): (45 feet x 2) Assistive device: Rolling walker (2 wheeled) Gait Pattern/deviations: Shuffle;Step-through pattern;Narrow base of support     General Gait Details: Started walk with pts quad cane - this is what she says she uses at home - pt very unsteady.  i had her sit back down and use RW.  pt given cues for breathing - without cues pt did not take deep breaths.  Pt had to be reminded repeatedely not to talk while walking - to focus on breathing.  Pt able to keep O2 sats >91% on room air but had to concentrate on breathing.  Pts legs got weak at 45 feet (HR and O2 sats OK) and pt had to have chair pulled up to sit and rest.    Stairs            Wheelchair Mobility    Modified Rankin (Stroke Patients Only)       Balance Overall balance assessment: Mild deficits observed, not formally tested(pt not safe with quad cane - needs RW for safety)                                           Pertinent Vitals/Pain Pain Assessment: 0-10 Pain Score: 4  Pain Location: pt hurts at cath site - she says  it took nursing long time to put it in.  later she reports to having pain all over Pain Intervention(s): Repositioned;Monitored during session    Home Living   Living Arrangements: Alone Available Help at Discharge: Family;Available PRN/intermittently Type of Home: House Home Access: Stairs to enter   [employer] of Steps: 4 with rail and quad cane Home Layout: One level Home Equipment: Kasandra Knudsen - quad;Walker - 2 wheels;Shower seat;Hand held shower head      Prior Function Level of Independence: Independent with assistive device(s)         Comments: pt reports son still helps clean and brings her groceries every 2 weeks. s he doesnt go out of the house without assist     Hand Dominance        Extremity/Trunk Assessment        Lower Extremity  Assessment Lower Extremity Assessment: Generalized weakness    Cervical / Trunk Assessment Cervical / Trunk Assessment: Normal  Communication   Communication: No difficulties  Cognition Arousal/Alertness: Awake/alert Behavior During Therapy: WFL for tasks assessed/performed Overall Cognitive Status: Within Functional Limits for tasks assessed                                 General Comments: pt with poor safety awareness. she was walking and then all of a sudden she said her legs were giving out and chair was pulled up for her to sit.  talked to her about identifying this sooner for her safety      General Comments      Exercises     Assessment/Plan    PT Assessment Patient needs continued PT services  PT Problem List Decreased mobility;Decreased strength;Decreased activity tolerance;Cardiopulmonary status limiting activity;Decreased safety awareness;Pain       PT Treatment Interventions Gait training;DME instruction;Therapeutic activities;Therapeutic exercise;Functional mobility training;Balance training;Patient/family education    PT Goals (Current goals can be found in the Care Plan section)  Acute Rehab PT Goals Patient Stated Goal: to do OK at home - be abel to breathe PT Goal Formulation: With patient Time For Goal Achievement: 02/06/18 Potential to Achieve Goals: Good    Frequency Min 3X/week   Barriers to discharge Decreased caregiver support pt reports seh had requested [facility] services from her PCP - they had come for one visit only -she cant remember which company    Co-evaluation               AM-PAC PT "6 Clicks" Daily Activity  Outcome Measure Difficulty turning over in bed (including adjusting bedclothes, sheets and blankets)?: Unable Difficulty moving from lying on back to sitting on the side of the bed? : Unable Difficulty sitting down on and standing up from a chair with arms (e.g., wheelchair, bedside commode, etc,.)?: Unable Help  needed moving to and from a bed to chair (including a wheelchair)?: A Lot Help needed walking in hospital room?: A Lot Help needed climbing 3-5 steps with a railing? : A Lot 6 Click Score: 9    End of Session Equipment Utilized During Treatment: Gait belt Activity Tolerance: Patient limited by fatigue Patient left: in chair;with chair alarm set;with call bell/phone within reach Nurse Communication: Mobility status PT Visit Diagnosis: Unsteadiness on feet (R26.81);History of falling (Z91.81);Difficulty in walking, not elsewhere classified (R26.2);Pain    Time: 1025-1110 PT Time Calculation (min) (ACUTE ONLY): 45 min   Charges:   PT Evaluation $PT Eval Low Complexity: 1 Low PT  Treatments $Gait Training: 23-37 mins   PT G Codes:        02-22-18   Rande Lawman, PT   Loyal Buba 2018-02-22, 11:30 AM

## 2018-01-31 LAB — BASIC METABOLIC PANEL
Anion gap: 11 (ref 5–15)
BUN: 17 mg/dL (ref 6–20)
CO2: 24 mmol/L (ref 22–32)
Calcium: 9 mg/dL (ref 8.9–10.3)
Chloride: 106 mmol/L (ref 101–111)
Creatinine, Ser: 0.67 mg/dL (ref 0.44–1.00)
GFR calc Af Amer: >60 mL/min (ref >60)
GFR calc non Af Amer: >60 mL/min (ref >60)
Glucose, Bld: 142 mg/dL — ABNORMAL HIGH (ref 65–99)
Potassium: 4.6 mmol/L (ref 3.5–5.1)
Sodium: 141 mmol/L (ref 135–145)

## 2018-01-31 LAB — CBC
HCT: 32.3 % — ABNORMAL LOW (ref 36.0–46.0)
Hemoglobin: 10.2 g/dL — ABNORMAL LOW (ref 12.0–15.0)
MCH: 29.1 pg (ref 26.0–34.0)
MCHC: 31.6 g/dL (ref 30.0–36.0)
MCV: 92 fL (ref 78.0–100.0)
Platelets: 410 10*3/uL — ABNORMAL HIGH (ref 150–400)
RBC: 3.51 MIL/uL — ABNORMAL LOW (ref 3.87–5.11)
RDW: 15.5 % (ref 11.5–15.5)
WBC: 19.1 10*3/uL — ABNORMAL HIGH (ref 4.0–10.5)

## 2018-01-31 MED ORDER — BISACODYL 10 MG RE SUPP
10.0000 mg | Freq: Once | RECTAL | Status: AC
  Administered 2018-01-31: 10 mg via RECTAL
  Filled 2018-01-31: qty 1

## 2018-01-31 NOTE — Progress Notes (Signed)
[facility]  PROGRESS NOTE  Heather [name] EUM:[id number] DOB: 24-Sep-1935 DOA: 01/28/2018 PCP: [name], MD   Brief HPI:    83 y.o. female with medical history significant of chronic pain; hypothyroidism; HTN; HLD; depression; COPD; and CAD presenting with SOB.  Symptoms started about 2 weeks ago.  She called to get an appointment but the doctor wasn't able to see her until last Tuesday.  When she saw the doctor, he did "nothing."  She was unable to get her medications, it was too early to fill the prescriptions.  +cough, productive of thick milky white sputum.  No fever.  No sick contacts.  Too weak to walk in her yard.     Subjective   Patient seen and examined, breathing has improved.   Assessment/Plan:     1. Acute on chronic hypoxic respiratory failure-improved , secondary to COPD exacerbation, continue will change Solu-Medrol to 60 mg IV Q8 hours, Atrovent/albuterol nebulizers Q6 hour, potassium DM Q4 hours PRN. Continue doxycycline. will change Doxy to PO 2. Hypokalemia-replete 3. Depression/anxiety-continue Zoloft 4. Hypertension-blood pressure is stable, continue Cardizem 5. Hypothyroidism-recent TSH was normal, continue Synthroid 6. Oral thrush - continue nystatin oral suspension 7. Leukocytosis-WBC is 19,000, likely from Solu-Medrol. Follow CBC in a.m.    DVT prophylaxis: Lovenox  Code Status: full code  Family Communication: no family at bedside  Disposition Plan: likely home in 2 to 3 days   Consultants:  none  Procedures:  none  Continuous infusions     Antibiotics:   Anti-infectives (From admission, onward)   Start     Dose/Rate Route Frequency Ordered Stop   01/30/18 1000  doxycycline (VIBRA-TABS) tablet 100 mg     100 mg Oral Every 12 hours 01/30/18 0836     01/28/18 2230  doxycycline (VIBRAMYCIN) 100 mg in sodium chloride 0.9 % 250 mL IVPB  Status:  Discontinued     100 mg 125 mL/hr over 120 Minutes Intravenous Every  12 hours 01/28/18 2225 01/30/18 0836       Objective   Vitals:   01/31/18 0542 01/31/18 0729 01/31/18 0940 01/31/18 1109  BP: 135/63     Pulse: 72     Resp: 14     Temp: 98.2 F (36.8 C)     TempSrc: Oral     SpO2: 91% 96% 95% 92%  Weight:      Height:        Intake/Output Summary (Last 24 hours) at 01/31/2018 1339 Last data filed at 01/31/2018 0800 Gross per 24 hour  Intake 480 ml  Output 751 ml  Net -271 ml   Filed Weights   01/28/18 1231  Weight: 49 kg (108 lb)     Physical Examination:   Physical Exam: Eyes: No icterus, extraocular muscles intact  Mouth: Oral mucosa is moist, no lesions on palate,  Neck: Supple, no deformities, masses, or tenderness Lungs: Normal respiratory effort, bilateral clear to auscultation, no crackles or wheezes.  Heart: Regular rate and rhythm, S1 and S2 normal, no murmurs, rubs auscultated Abdomen: BS normoactive,soft,nondistended,non-tender to palpation,no organomegaly Extremities: No pretibial edema, no erythema, no cyanosis, no clubbing Neuro : Alert and oriented to time, place and person, No focal deficits Skin: No rashes seen on exam     Data Reviewed: I have personally reviewed following labs and imaging studies  CBG: No results for input(s): GLUCAP in the last 168 hours.  CBC: Recent Labs  Lab 01/26/18 1322 01/28/18 1422 01/28/18 1435 01/29/18 0551 01/30/18 5400  01/31/18 0636  WBC 8.0 6.8  --  8.3 23.9* 19.1*  NEUTROABS 5.1 4.8  --   --   --   --   HGB 11.4* 11.2* 11.9* 10.3* 10.1* 10.2*  HCT 35.6* 36.1 35.0* 33.3* 32.4* 32.3*  MCV 88.1 92.1  --  91.2 91.5 92.0  PLT 441.0* 406*  --  425* 442* 410*    Basic Metabolic Panel: Recent Labs  Lab 01/26/18 1322 01/28/18 1422 01/28/18 1435 01/29/18 0551 01/30/18 0626 01/31/18 0636  NA 140 140 140 139 141 141  K 4.2 3.4* 3.5 2.5* 5.0 4.6  CL 100 101 98* 102 107 106  CO2 34* 27  --  25 25 24   GLUCOSE 105* 110* 107* 281* 156* 142*  BUN 8 9 8 11 11 17    CREATININE 0.77 0.69 0.60 0.78 0.61 0.67  CALCIUM 9.7 9.1  --  9.0 8.9 9.0    No results found for this or any previous visit (from the past 240 hour(s)).   Liver Function Tests: Recent Labs  Lab 01/26/18 1322  AST 14  ALT 8  ALKPHOS 87  BILITOT 0.5  PROT 6.7  ALBUMIN 4.1   No results for input(s): LIPASE, AMYLASE in the last 168 hours. No results for input(s): AMMONIA in the last 168 hours.  Cardiac Enzymes: No results for input(s): CKTOTAL, CKMB, CKMBINDEX, TROPONINI in the last 168 hours. BNP (last 3 results) Recent Labs    01/28/18 1422  BNP 128.7*    ProBNP (last 3 results) No results for input(s): PROBNP in the last 8760 hours.    Studies: No results found.  Scheduled Meds: . diltiazem  240 mg Oral Daily  . docusate sodium  100 mg Oral BID  . doxycycline  100 mg Oral Q12H  . enoxaparin (LOVENOX) injection  40 mg Subcutaneous QHS  . ipratropium-albuterol  3 mL Nebulization QID  . levothyroxine  25 mcg Oral QAC breakfast  . methylPREDNISolone (SOLU-MEDROL) injection  60 mg Intravenous Q6H  . nystatin  5 mL Oral QID  . pantoprazole  40 mg Oral Daily  . sertraline  50 mg Oral Daily      Time spent: 25 min  [facility] Pager [phone]. If 7PM-7AM, please contact night-coverage at [email or web address], Office  [phone]  password [email or web address]  01/31/2018, 1:39 PM  LOS: 3 days

## 2018-02-01 DIAGNOSIS — J9601 Acute respiratory failure with hypoxia: Secondary | ICD-10-CM

## 2018-02-01 LAB — BASIC METABOLIC PANEL
Anion gap: 10 (ref 5–15)
BUN: 20 mg/dL (ref 6–20)
CO2: 24 mmol/L (ref 22–32)
Calcium: 9.3 mg/dL (ref 8.9–10.3)
Chloride: 105 mmol/L (ref 101–111)
Creatinine, Ser: 0.71 mg/dL (ref 0.44–1.00)
GFR calc Af Amer: >60 mL/min (ref >60)
GFR calc non Af Amer: >60 mL/min (ref >60)
Glucose, Bld: 139 mg/dL — ABNORMAL HIGH (ref 65–99)
Potassium: 4.2 mmol/L (ref 3.5–5.1)
Sodium: 139 mmol/L (ref 135–145)

## 2018-02-01 MED ORDER — IPRATROPIUM-ALBUTEROL 0.5-2.5 (3) MG/3ML IN SOLN: 3.0000 mL | Freq: Two times a day (BID) | RESPIRATORY_TRACT | Status: DC

## 2018-02-01 MED ORDER — PREDNISONE 10 MG PO TABS: ORAL_TABLET | ORAL | 0 refills | Status: DC

## 2018-02-01 NOTE — Discharge Summary (Signed)
Physician Discharge Summary  Heather [name] VPX:[id number] DOB: 1935-05-14 DOA: 01/28/2018  PCP: [name], MD  Admit date: 01/28/2018 Discharge date: 02/01/2018  Time spent: 25* minutes  Recommendations for Outpatient Follow-up:  1. Follow up PCP in 2 weeks   Discharge Diagnoses:  Principal Problem:   COPD with acute exacerbation ([place]) Active Problems:   Hypothyroidism   Anxiety and depression   Essential hypertension   Acute respiratory failure with hypoxia ([facility])   Oral thrush   Malnutrition of moderate degree   Discharge Condition: Stable  Diet recommendation:  Heart healthy diet  Filed Weights   01/28/18 1231  Weight: 49 kg (108 lb)    History of present illness:  83 y.o.femalewith medical history significant ofchronic pain; hypothyroidism; HTN; HLD; depression; COPD; and CAD presenting withSOB. Symptoms started about 2 weeks ago. She called to get an appointment but the doctor wasn't able to see her until last Tuesday. When she saw the doctor, he did "nothing." She was unable to get her medications, it was too early to fill the prescriptions. +cough, productive of thick milky white sputum. No fever. No sick contacts. Too weak to walk in her yard.    Hospital Course:   1. Acute on chronic hypoxic respiratory failure-improved , secondary to COPD exacerbation, patient was started on  Solu-Medrol  60 mg IV Q8 hours, Atrovent/albuterol nebulizers Q6 hour, Doxycycline. Will discharge home on Prednisone taper. Has completed 5 days of Doxycyline in the hospital. 2. Hypokalemia-replete 3. Depression/anxiety-continue Zoloft 4. Hypertension-blood pressure is stable, continue Cardizem 5. Hypothyroidism-recent TSH was normal, continue Synthroid 6. Oral thrush - resolved with  nystatin oral suspension 7. Leukocytosis-WBC is 19,000, likely from Solu-Medrol.      Procedures:  None   Consultations:  None   Discharge Exam: Vitals:   01/31/18  2136 02/01/18 0512  BP:  (!) 142/67  Pulse:  77  Resp:  18  Temp:  97.8 F (36.6 C)  SpO2: 97% 91%    General: Appears in no acute distress Cardiovascular: S1S2 RRR Respiratory: Clear bilaterally  Discharge Instructions    Allergies as of 02/01/2018      Reactions   Tylenol [acetaminophen] Other (See Comments)   Nightmares   Symbicort [budesonide-formoterol Fumarate] Other (See Comments)   Pt felt like her tongue was swollen, could not swallow   Duloxetine Other (See Comments)   Patient doesn't recall   Metronidazole Other (See Comments)   Patient doesn't recall   Rofecoxib Other (See Comments)   Patient doesn't recall      Medication List    TAKE these medications   albuterol (2.5 MG/3ML) 0.083% nebulizer solution Commonly known as:  PROVENTIL INHALE CONTENTS OF 1 AMP VIA NEBULIZER EVERY 6 HRS AS NEEDED FOR WHEEZING/ SHORTNESS OF BREATH.   Albuterol Sulfate 108 (90 Base) MCG/ACT Aepb Commonly known as:  PROAIR RESPICLICK Inhale 2 puffs into the lungs 4 (four) times daily as needed.   Cholecalciferol 2000 units Tabs Take 2,000 Units by mouth daily.   clorazepate 7.5 MG tablet Commonly known as:  TRANXENE Take 1 tablet (7.5 mg total) by mouth 2 (two) times daily as needed for anxiety.   diltiazem 240 MG 24 hr capsule Commonly known as:  CARDIZEM CD TAKE (1) CAPSULE DAILY.   esomeprazole 40 MG capsule Commonly known as:  NEXIUM TAKE 1 CAPSULE DAILY BEFORE BREAKFAST.   levothyroxine 25 MCG tablet Commonly known as:  SYNTHROID, LEVOTHROID Take 1 tablet (25 mcg total) by mouth daily before breakfast.  MUCINEX FAST-MAX DM MAX 5-100 MG/5ML Liqd Generic drug:  Dextromethorphan-guaiFENesin Take 15 mLs by mouth every 4 (four) hours as needed (chest congestion).   predniSONE 10 MG tablet Commonly known as:  DELTASONE Prednisone 40 mg po daily x 1 day then Prednisone 30 mg po daily x 1 day then Prednisone 20 mg po daily x 1 day then Prednisone 10 mg daily x 1  day then stop...   sertraline 50 MG tablet Commonly known as:  ZOLOFT Take 1 tablet (50 mg total) by mouth daily.   SYSTANE OP Place 1 drop into both eyes 2 (two) times daily as needed (for dry eyes).   tiotropium 18 MCG inhalation capsule Commonly known as:  SPIRIVA HANDIHALER Place 1 capsule (18 mcg total) into inhaler and inhale daily.   traMADol 50 MG tablet Commonly known as:  ULTRAM Take 1 tablet (50 mg total) by mouth every 6 (six) hours as needed.   traZODone 50 MG tablet Commonly known as:  DESYREL TAKE 1/2 TO 1 TABLET AT BEDTIME AS NEEDED FOR REST.      Allergies  Allergen Reactions  . Tylenol [Acetaminophen] Other (See Comments)    Nightmares  . Symbicort [Budesonide-Formoterol Fumarate] Other (See Comments)    Pt felt like her tongue was swollen, could not swallow  . Duloxetine Other (See Comments)    Patient doesn't recall  . Metronidazole Other (See Comments)    Patient doesn't recall   . Rofecoxib Other (See Comments)    Patient doesn't recall    Follow-up Information    [name], MD.   Specialty:  Internal Medicine Contact information: [place] [address] [phone]            The results of significant diagnostics from this hospitalization (including imaging, microbiology, ancillary and laboratory) are listed below for reference.    Significant Diagnostic Studies: Dg Chest 2 View  Result Date: 01/28/2018 CLINICAL DATA:  Cough. EXAM: CHEST  2 VIEW COMPARISON:  Radiographs of May 28, 2016. FINDINGS: The heart size and mediastinal contours are within normal limits. Both lungs are clear. No pneumothorax or pleural effusion is noted. The visualized skeletal structures are unremarkable. IMPRESSION: No active cardiopulmonary disease. Electronically Signed   By: Marijo Conception, M.D.   On: 01/28/2018 15:44   Ct Angio Chest Pe W And/or Wo Contrast  Result Date: 01/28/2018 CLINICAL DATA:  Low oxygen  saturations.  Cough x2 days and vomiting. EXAM: CT ANGIOGRAPHY CHEST WITH CONTRAST TECHNIQUE: Multidetector CT imaging of the chest was performed using the standard protocol during bolus administration of intravenous contrast. Multiplanar CT image reconstructions and MIPs were obtained to evaluate the vascular anatomy. CONTRAST:  124mL ISOVUE-370 IOPAMIDOL (ISOVUE-370) INJECTION 76% COMPARISON:  07/23/2015 CT FINDINGS: Cardiovascular: Satisfactory opacification of the pulmonary arteries to the segmental level. No evidence of pulmonary embolism. Normal heart size. No pericardial effusion. Coronary arteriosclerosis is noted along the LAD. No aortic aneurysm. There is aortic atherosclerosis. Mediastinum/Nodes: No enlarged mediastinal, hilar, or axillary lymph nodes. Thyroid gland, trachea, and esophagus demonstrate no significant findings. Lungs/Pleura: Centrilobular emphysema of the lungs. No dominant mass or pneumonic consolidations. No effusion or pneumothorax. There is subsegmental atelectasis at the right lung base. Mild peribronchial thickening is seen to the right lower lobe where there is a 1 cm rounded opacity likely representing a small focus of mucous plugging. As a pulmonary nodule is not entirely excluded, short-term interval follow-up is recommended. Upper Abdomen: No acute abnormality. Musculoskeletal: No chest  wall abnormality. No acute or significant osseous findings. Review of the MIP images confirms the above findings. IMPRESSION: 1. Centrilobular emphysema with chronic bronchitic change and peribronchial thickening noted. Probable area of inspissated mucus in the right lower lobe accounting for a nodular 1 cm opacity seen, new since prior 2016 CT. Consider one of the following in 3 months for both low-risk and high-risk individuals: (a) repeat chest CT, (b) follow-up PET-CT, or (c) tissue sampling. This recommendation follows the consensus statement: Guidelines for Management of Incidental Pulmonary  Nodules Detected on CT Images: From the Fleischner Society 2017; Radiology 2017; 284:228-243. 2. No acute pulmonary embolus. 3. Coronary arteriosclerosis and aortic atherosclerosis. No aneurysm or dissection of the aorta. Aortic Atherosclerosis (ICD10-I70.0) and Emphysema (ICD10-J43.9). Electronically Signed   By: Ashley Royalty M.D.   On: 01/28/2018 18:41   Dg Hip Unilat With Pelvis 2-3 Views Left  Result Date: 01/28/2018 CLINICAL DATA:  Left hip pain. EXAM: DG HIP (WITH OR WITHOUT PELVIS) 2-3V LEFT COMPARISON:  None. FINDINGS: There is no evidence of hip fracture or dislocation. There is no evidence of arthropathy or other focal bone abnormality. IMPRESSION: Normal left hip. Electronically Signed   By: Marijo Conception, M.D.   On: 01/28/2018 15:46    Microbiology: No results found for this or any previous visit (from the past 240 hour(s)).   Labs: Basic Metabolic Panel: Recent Labs  Lab 01/28/18 1422 01/28/18 1435 01/29/18 0551 01/30/18 0626 01/31/18 0636 02/01/18 0617  NA 140 140 139 141 141 139  K 3.4* 3.5 2.5* 5.0 4.6 4.2  CL 101 98* 102 107 106 105  CO2 27  --  25 25 24 24   GLUCOSE 110* 107* 281* 156* 142* 139*  BUN 9 8 11 11 17 20   CREATININE 0.69 0.60 0.78 0.61 0.67 0.71  CALCIUM 9.1  --  9.0 8.9 9.0 9.3   Liver Function Tests: Recent Labs  Lab 01/26/18 1322  AST 14  ALT 8  ALKPHOS 87  BILITOT 0.5  PROT 6.7  ALBUMIN 4.1   No results for input(s): LIPASE, AMYLASE in the last 168 hours. No results for input(s): AMMONIA in the last 168 hours. CBC: Recent Labs  Lab 01/26/18 1322 01/28/18 1422 01/28/18 1435 01/29/18 0551 01/30/18 0626 01/31/18 0636  WBC 8.0 6.8  --  8.3 23.9* 19.1*  NEUTROABS 5.1 4.8  --   --   --   --   HGB 11.4* 11.2* 11.9* 10.3* 10.1* 10.2*  HCT 35.6* 36.1 35.0* 33.3* 32.4* 32.3*  MCV 88.1 92.1  --  91.2 91.5 92.0  PLT 441.0* 406*  --  425* 442* 410*   Cardiac Enzymes: No results for input(s): CKTOTAL, CKMB, CKMBINDEX, TROPONINI in the  last 168 hours. BNP: BNP (last 3 results) Recent Labs    01/28/18 1422  BNP 128.7*      Signed:  Oswald Hillock MD.  [facility] 02/01/2018, 10:43 AM

## 2018-02-01 NOTE — Progress Notes (Signed)
Physical Therapy Treatment Patient Details Name: Heather [name] MRN: [id number] DOB: [birth year] Today's Date: 02/01/2018    History of Present Illness Heather [name] is a 83 y.o. female with medical history significant of chronic pain; hypothyroidism; HTN; HLD; depression; COPD; and CAD presenting with SOB.  pt with COPD exacerbation.    PT Comments    Pt is progressing well with mobility, she ambulated 34' with RW, SaO2 91-92% on room air, HR 80s. Pt reports h/o BLEs "giving out" and h/o 3 falls in past 1 year. She had mild buckling twice today, pt was able to maintain balance with RW.  HHPT recommended.   Follow Up Recommendations  Home health PT;Supervision for mobility/OOB     Equipment Recommendations  None recommended by PT    Recommendations for Other Services       Precautions / Restrictions Precautions Precautions: Fall Precaution Comments: 3 falls in past 1 year Restrictions Other Position/Activity Restrictions: pt reports a recent fall when she fell out of the chair.  she had bad habit of not reaching back when sitting - emphasized this in PT session today    Mobility  Bed Mobility Overal bed mobility: Modified Independent;Needs Assistance Bed Mobility: Supine to Sit     Supine to sit: Supervision;HOB elevated     General bed mobility comments: pt needed cues for getting out of bed and hand placement  Transfers Overall transfer level: Needs assistance Equipment used: Rolling walker (2 wheeled) Transfers: Sit to/from Stand Sit to Stand: Min guard         General transfer comment: VCs hand placement  Ambulation/Gait Ambulation/Gait assistance: Min guard Ambulation Distance (Feet): 90 Feet Assistive device: Rolling walker (2 wheeled) Gait Pattern/deviations: Shuffle;Step-through pattern;Narrow base of support   Gait velocity interpretation: Below normal speed for age/gender General Gait Details: VCs to step closer to RW, mild buckling of LEs  x 2 in which pt was able to maintain her balance using the RW, she reports h/o BLEs "giving out"; SaO2 91-92% on room air walking   Stairs            Wheelchair Mobility    Modified Rankin (Stroke Patients Only)       Balance Overall balance assessment: Mild deficits observed, not formally tested;History of Falls(pt not safe with quad cane - needs RW for safety)                                          Cognition Arousal/Alertness: Awake/alert Behavior During Therapy: WFL for tasks assessed/performed Overall Cognitive Status: Within Functional Limits for tasks assessed                                        Exercises      General Comments        Pertinent Vitals/Pain Pain Score: 4  Pain Location: lower abdomen (pt thinks this is 2* constipation) Pain Descriptors / Indicators: Aching Pain Intervention(s): Monitored during session;Premedicated before session    Home Living                      Prior Function            PT Goals (current goals can now be found in the care plan section) Acute Rehab PT Goals Patient  Stated Goal: to do OK at home - be able to breathe PT Goal Formulation: With patient/family Time For Goal Achievement: 02/06/18 Potential to Achieve Goals: Good Progress towards PT goals: Progressing toward goals    Frequency    Min 3X/week      PT Plan Current plan remains appropriate    Co-evaluation              AM-PAC PT "6 Clicks" Daily Activity  Outcome Measure  Difficulty turning over in bed (including adjusting bedclothes, sheets and blankets)?: A Little Difficulty moving from lying on back to sitting on the side of the bed? : A Little Difficulty sitting down on and standing up from a chair with arms (e.g., wheelchair, bedside commode, etc,.)?: A Little Help needed moving to and from a bed to chair (including a wheelchair)?: A Little Help needed walking in hospital room?: A  Little Help needed climbing 3-5 steps with a railing? : A Lot 6 Click Score: 17    End of Session Equipment Utilized During Treatment: Gait belt Activity Tolerance: Patient tolerated treatment well Patient left: in chair;with call bell/phone within reach;with family/visitor present Nurse Communication: Mobility status PT Visit Diagnosis: Unsteadiness on feet (R26.81);History of falling (Z91.81);Difficulty in walking, not elsewhere classified (R26.2);Pain     Time: 5176-1607 PT Time Calculation (min) (ACUTE ONLY): 14 min  Charges:  $Gait Training: 8-22 mins                    G Codes:          Philomena Doheny 02/01/2018, 10:50 AM [phone]

## 2018-02-01 NOTE — Care Management Important Message (Signed)
Important Message  Patient Details  Name: PATRICK SOHM MRN: [id number] Date of Birth: 1935-01-24   Medicare Important Message Given:  Yes    Kerin Salen 02/01/2018, 11:15 AMImportant Message  Patient Details  Name: DONA WALBY MRN: [id number] Date of Birth: [birth year]   Medicare Important Message Given:  Yes    Kerin Salen 02/01/2018, 11:15 AM

## 2018-02-01 NOTE — Progress Notes (Signed)
Foley Catheter removed at 1010 am. Will monitor urine output

## 2018-02-02 ENCOUNTER — Telehealth: Payer: Self-pay | Admitting: Family Medicine

## 2018-02-02 NOTE — Telephone Encounter (Signed)
I left a voice message for pt to return my call.  

## 2018-02-03 NOTE — Telephone Encounter (Signed)
I left another voice message for pt to return my call.

## 2018-02-04 NOTE — Telephone Encounter (Signed)
I left another voice message for pt to return my call.

## 2018-02-05 ENCOUNTER — Telehealth: Payer: Self-pay | Admitting: Internal Medicine

## 2018-02-05 NOTE — Telephone Encounter (Signed)
Copied from [place]. Topic: Quick Communication - See Telephone Encounter >> Feb 05, 2018  3:10 PM Burnis Medin, NT wrote: CRM for notification. See Telephone encounter for: Son is calling because he said pt has been exposed to the flu. Pt is not showing any signs but son wanted to see if Tam flu can be sent to [place], [place]. [phone] (Phone) [phone] (Fax)    02/05/18.

## 2018-02-05 NOTE — Telephone Encounter (Signed)
Patient's son [name] called, he said "my mom just got out of the hospital and I brought her to stay with me for a while. My son was just diagnosed with the flu today and his doctor suggested that all of [place] take Tamiflu, especially her with her age, so that's why I called the office to get a prescription sent in." I advised this would be sent to the provider, he verbalized understanding.

## 2018-02-08 ENCOUNTER — Telehealth: Payer: Self-pay | Admitting: Internal Medicine

## 2018-02-08 NOTE — Telephone Encounter (Signed)
Copied from [place] [phone] [phone]. Topic: Quick Communication - See Telephone Encounter >> Feb 08, 2018  1:00 PM Antonieta Iba C wrote: CRM for notification. See Telephone encounter for: Gwinda Passe called in to request verbal orders for PT -- Frequency 2 times a week for 4 weeks and 1 time a week 1 week. CB: [phone]   02/08/18.

## 2018-02-08 NOTE — Telephone Encounter (Signed)
Copied from [place] [id number]. Topic: Quick Communication - See Telephone Encounter >> Feb 08, 2018  9:53 AM Ahmed Prima L wrote: CRM for notification. See Telephone encounter for:   02/08/18.  Marlowe Kays RN from [facility] would like verbal orders to [facility]. Will fax orders   [phone]

## 2018-02-09 ENCOUNTER — Other Ambulatory Visit: Payer: Self-pay | Admitting: Family Medicine

## 2018-02-09 ENCOUNTER — Other Ambulatory Visit: Payer: Self-pay | Admitting: Internal Medicine

## 2018-02-09 NOTE — Telephone Encounter (Signed)
Dr Raliegh Ip

## 2018-02-10 NOTE — Telephone Encounter (Signed)
Called and left detail voice message for Heather [name] from [facility] giving the verbal orders for [facility] for patient.

## 2018-02-11 ENCOUNTER — Other Ambulatory Visit: Payer: Self-pay | Admitting: Family Medicine

## 2018-02-11 ENCOUNTER — Telehealth: Payer: Self-pay

## 2018-02-11 MED ORDER — CLORAZEPATE DIPOTASSIUM 7.5 MG PO TABS: ORAL_TABLET | ORAL | 0 refills | Status: DC

## 2018-02-11 MED ORDER — OSELTAMIVIR PHOSPHATE 75 MG PO CAPS: 75.0000 mg | ORAL_CAPSULE | Freq: Two times a day (BID) | ORAL | 0 refills | Status: AC

## 2018-02-11 NOTE — Telephone Encounter (Signed)
Orders given to [facility] for [place] for [facility] per Dr.Todd

## 2018-02-11 NOTE — Telephone Encounter (Signed)
Copied from [place] [id number]. Topic: Quick Communication - Rx Refill/Question >> Feb 11, 2018 11:01 AM Oliver Pila B wrote: Medication: clorazepate (TRANXENE) 7.5 MG tablet [[id number]] DISCONTINUED, benzonatate (TESSALON) 100 MG capsule [[id number]] DISCONTINUED,  diltiazem (CARDIZEM CD) 240 MG 24 hr capsule [[id number]] ,  zolpidem (AMBIEN) 5 MG tablet [[id number]]    Has the patient contacted their pharmacy? Yes.     (Agent: If no, request that the patient contact the pharmacy for the refill.)   Preferred Pharmacy (with phone number or street name): [facility]: Please be advised that RX refills may take up to 3 business days. We ask that you follow-up with your pharmacy.

## 2018-02-11 NOTE — Telephone Encounter (Signed)
Spoke to patient and she stated that at 8:30pm she had a fever of 100.4. The patient rechecked her temperature while being on the phone with me and stated that it was at 98.7. She also said that the home nurse told her to try Tylenol and the patient claims that helped a lot. Patient had symptoms of a cough, sore throat and of body aches but patient claims that the body aches may be due to "coming off the steroids". Patient want a medication sent [facility]. I informed the patient that Dr.Kwiatkowski is out of the office and that I will speak to another provider regarding the issue. Informed patient that I will return phone call for update.

## 2018-02-11 NOTE — Telephone Encounter (Signed)
Called patient for update but no respond or voicemail set up. Tamiflu was sent out Because patient said that she could not come out in this weather feeling the way she feels to be seen. I called [place] [facility] to make sure that the patient will have her medication filled and delivered to her to day the pharmacy confirmed that the medication will be sent out at 11 am to the patients house. Spoke to patient regarding the update and went over medication and dosage. No further action needed.

## 2018-02-11 NOTE — Telephone Encounter (Signed)
Dr Raliegh Ip pt

## 2018-02-11 NOTE — Telephone Encounter (Signed)
2 prescription printed with error on 02/11/2018. Prescription shredded with Corrected one printed.

## 2018-02-11 NOTE — Telephone Encounter (Signed)
Spoke with patient's son [name] and he stated that his mom is running a fever of 101 per home nurse. Calling to check on her status now.

## 2018-02-11 NOTE — Telephone Encounter (Signed)
Ambien refill request  LOV 01/26/18 with Dr. Burnice Logan  [facility] [place], [place].

## 2018-02-12 NOTE — Telephone Encounter (Signed)
Rx/forms faxed. Fax confirmation received.

## 2018-02-14 NOTE — Telephone Encounter (Signed)
Okay for verbal order for PT

## 2018-02-15 NOTE — Telephone Encounter (Signed)
Verbal orders given to [facility]

## 2018-02-18 NOTE — Progress Notes (Signed)
Subjective:    Patient ID: Heather [name], female    DOB: 09/20/1935, 83 y.o.   MRN: [id number]  HPI She is here for an acute visit for cold symptoms.  She was in the hospital recently for the flu and COPD exacerbation.  She had low oxgyen at that time.  She has home nurse coming to her house. She is not on oxygen and has never been on oxygen.      Monday night she woke up and felt a squeezing presence in her lower chest - upper abdomen region - it wrapped around her.  Over the past week she has had decreased appetite, fatigue, sore throat, productive cough or clear, thick sputum, intermittent SOB, wheeze, constipation, and lightheadedness.    When she walked back here her oxygen saturation was in the 70% and improved with 2-3 L of oxygen.    She denies fever, chills, nasal congestion, ear pain, sinus pain, nausea, diarrhea and headaches.    Medications and allergies reviewed with patient and updated if appropriate.  Patient Active Problem List   Diagnosis Date Noted  . Oral thrush 01/29/2018  . Malnutrition of moderate degree 01/29/2018  . COPD with acute exacerbation ([place]) 01/28/2018  . COPD with emphysema ([place]) 06/26/2017  . COPD exacerbation ([place]) 05/29/2016  . Hypoxia 05/29/2016  . Coronary atherosclerosis 07/24/2015  . Atherosclerosis of aorta ([place]) 07/24/2015  . Dysphagia 07/24/2015  . Acute respiratory failure with hypoxia ([place]) 07/23/2015  . Bronchospasm with bronchitis, acute 07/23/2015  . HIP PAIN, LEFT 05/16/2010  . LOW BACK PAIN, CHRONIC 10/01/2009  . PARESTHESIA 10/01/2009  . Hypothyroidism 07/23/2009  . Dyslipidemia 07/23/2009  . Anxiety state 07/23/2009  . Anxiety and depression 07/23/2009  . Essential hypertension 07/23/2009  . GERD 07/23/2009  . DIVERTICULOSIS, COLON 07/23/2009  . HEADACHE 07/23/2009    Current Outpatient Medications on File Prior to Visit  Medication Sig Dispense Refill  . albuterol (PROVENTIL) (2.5 MG/3ML) 0.083% nebulizer  solution INHALE CONTENTS OF 1 AMP VIA NEBULIZER EVERY 6 HRS AS NEEDED FOR WHEEZING/ SHORTNESS OF BREATH. 225 mL 0  . Albuterol Sulfate (PROAIR RESPICLICK) 468 (90 Base) MCG/ACT AEPB Inhale 2 puffs into the lungs 4 (four) times daily as needed. 1 each 6  . benzonatate (TESSALON) 100 MG capsule TAKE 1 CAPSULE TWICE DAILY AS NEEDED FOR COUGH. 20 capsule 0  . Cholecalciferol 2000 UNITS TABS Take 2,000 Units by mouth daily.    . clorazepate (TRANXENE) 7.5 MG tablet TAKE (1) TABLET TWICE DAILY AS NEEDED FOR ANXIETY. 60 tablet 0  . Dextromethorphan-guaiFENesin ([place] FAST-MAX DM MAX) 5-100 MG/5ML LIQD Take 15 mLs by mouth every 4 (four) hours as needed (chest congestion).    [name] diltiazem (CARDIZEM CD) 240 MG 24 hr capsule TAKE (1) CAPSULE DAILY. 90 capsule 0  . esomeprazole (NEXIUM) 40 MG capsule TAKE 1 CAPSULE DAILY BEFORE BREAKFAST. 90 capsule 2  . levothyroxine (SYNTHROID, LEVOTHROID) 25 MCG tablet Take 1 tablet (25 mcg total) by mouth daily before breakfast. 90 tablet 1  . Polyethyl Glycol-Propyl Glycol (SYSTANE OP) Place 1 drop into both eyes 2 (two) times daily as needed (for dry eyes).     . sertraline (ZOLOFT) 50 MG tablet Take 1 tablet (50 mg total) by mouth daily. 90 tablet 3  . tiotropium (SPIRIVA HANDIHALER) 18 MCG inhalation capsule Place 1 capsule (18 mcg total) into inhaler and inhale daily. 90 capsule 3  . traMADol (ULTRAM) 50 MG tablet Take 1 tablet (50 mg total) by mouth  every 6 (six) hours as needed. 90 tablet 0  . traZODone (DESYREL) 50 MG tablet TAKE 1/2 TO 1 TABLET AT BEDTIME AS NEEDED FOR REST. 90 tablet 0  . zolpidem (AMBIEN) 5 MG tablet TAKE 1 TABLET AT BEDTIME AS NEEDED. 30 tablet 0   No current facility-administered medications on file prior to visit.     Past Medical History:  Diagnosis Date  . ANXIETY 07/23/2009  . Atherosclerosis of aorta ([place]) 07/24/2015  . Complication of anesthesia 7 or 8 yrs ago   woke up during colonscopy  . Coronary atherosclerosis 07/24/2015  .  DEPRESSION 07/23/2009  . DIVERTICULOSIS, COLON 07/23/2009  . GERD 07/23/2009  . Headache(784.0) 07/23/2009   occasional  . Hemorrhoids   . HIP PAIN, LEFT 05/16/2010  . History of Crohn's disease   . HYPERLIPIDEMIA 07/23/2009  . HYPERTENSION 07/23/2009  . HYPOTHYROIDISM 07/23/2009  . IBS (irritable bowel syndrome)   . Insomnia   . LOW BACK PAIN, CHRONIC 10/01/2009  . PARESTHESIA 10/01/2009  . Shingles 2006   back    Past Surgical History:  Procedure Laterality Date  . ABDOMINAL HYSTERECTOMY  age 24 or 42  . APPENDECTOMY  yrs ago  . BILATERAL SALPINGOOPHORECTOMY  age 89 or 55  . COLONOSCOPY N/A 11/17/2013   Procedure: COLONOSCOPY;  Surgeon: Cleotis Nipper, MD;  Location: [facility];  Service: Endoscopy;  Laterality: N/A;  . ESOPHAGOGASTRODUODENOSCOPY N/A 11/17/2013   Procedure: ESOPHAGOGASTRODUODENOSCOPY (EGD);  Surgeon: Cleotis Nipper, MD;  Location: [facility];  Service: Endoscopy;  Laterality: N/A;  . HEMORRHOID SURGERY  yrs ago  . TONSILLECTOMY  yrs ago    Social History   Socioeconomic History  . Marital status: Widowed    Spouse name: None  . Number of children: 1  . Years of education: None  . Highest education level: None  Social Needs  . Financial resource strain: None  . Food insecurity - worry: None  . Food insecurity - inability: None  . Transportation needs - medical: None  . Transportation needs - non-medical: None  Occupational History  . None  Tobacco Use  . Smoking status: Never Smoker  . Smokeless tobacco: Never Used  . Tobacco comment: smoked rarely in youth  Substance and Sexual Activity  . Alcohol use: No    Alcohol/week: 0.0 oz  . Drug use: No  . Sexual activity: None  Other Topics Concern  . None  Social History Narrative   Widowed.  Lives alone in her own home.  Ambulates with a cane/walker when needed.    Family History  Problem Relation Age of Onset  . Cancer Mother        Unknown type  . COPD Sister   . Cancer Sister         Breast  . Clotting disorder Neg Hx     Review of Systems  Constitutional: Positive for appetite change (decreased) and fatigue. Negative for chills and fever.  HENT: Positive for sore throat. Negative for congestion, ear pain and sinus pain.   Respiratory: Positive for cough (productive of clear, thick sputum), shortness of breath (intermittent) and wheezing.   Gastrointestinal: Positive for constipation. Negative for diarrhea and nausea.  Endocrine: Positive for cold intolerance.  Musculoskeletal: Positive for arthralgias (chronic arthritis) and myalgias (chronic).  Neurological: Positive for light-headedness. Negative for headaches.       Objective:   Vitals:   02/19/18 1359 02/19/18 1400  BP:    Pulse: 90 90  Temp:    SpO2:  93% 96%   Filed Weights   02/19/18 1358  Weight: 104 lb 8 oz (47.4 kg)   Body mass index is 21.11 kg/m.  Wt Readings from Last 3 Encounters:  02/19/18 104 lb 8 oz (47.4 kg)  01/28/18 108 lb (49 kg)  01/26/18 111 lb (50.3 kg)     Physical Exam GENERAL APPEARANCE: Appears stated age, well appearing, NAD EYES: conjunctiva clear, no icterus HEENT: bilateral tympanic membranes and ear canals normal, oropharynx with mild erythema, no thyromegaly, trachea midline, no cervical or supraclavicular lymphadenopathy LUNGS: unlabored breathing, fair air entry bilaterally, b/l expiratory wheeze - mild, no crackles CARDIOVASCULAR: Normal S1,S2 without murmurs, no edema ABD: soft, NT, ND SKIN: warm, dry        Assessment & Plan:   See Problem List for Assessment and Plan of chronic medical problems.

## 2018-02-19 ENCOUNTER — Ambulatory Visit (INDEPENDENT_AMBULATORY_CARE_PROVIDER_SITE_OTHER): Payer: Medicare Other | Admitting: Internal Medicine

## 2018-02-19 ENCOUNTER — Ambulatory Visit: Payer: Medicare Other | Admitting: Internal Medicine

## 2018-02-19 ENCOUNTER — Inpatient Hospital Stay (HOSPITAL_COMMUNITY)
Admission: EM | Admit: 2018-02-19 | Discharge: 2018-02-23 | DRG: 190 | Disposition: A | Discharge status: Home Health | Payer: Medicare Other | Attending: Family Medicine | Admitting: Family Medicine

## 2018-02-19 ENCOUNTER — Emergency Department (HOSPITAL_COMMUNITY): Payer: Medicare Other

## 2018-02-19 ENCOUNTER — Encounter: Payer: Self-pay | Admitting: Internal Medicine

## 2018-02-19 VITALS — BP 110/60 | HR 90 | Temp 98.4°F | Ht 59.0 in | Wt 104.5 lb

## 2018-02-19 DIAGNOSIS — I2510 Atherosclerotic heart disease of native coronary artery without angina pectoris: Secondary | ICD-10-CM | POA: Diagnosis present

## 2018-02-19 DIAGNOSIS — R0600 Dyspnea, unspecified: Secondary | ICD-10-CM | POA: Diagnosis not present

## 2018-02-19 DIAGNOSIS — E039 Hypothyroidism, unspecified: Secondary | ICD-10-CM | POA: Diagnosis present

## 2018-02-19 DIAGNOSIS — F419 Anxiety disorder, unspecified: Secondary | ICD-10-CM | POA: Diagnosis present

## 2018-02-19 DIAGNOSIS — R0602 Shortness of breath: Secondary | ICD-10-CM | POA: Diagnosis not present

## 2018-02-19 DIAGNOSIS — I10 Essential (primary) hypertension: Secondary | ICD-10-CM | POA: Diagnosis present

## 2018-02-19 DIAGNOSIS — I493 Ventricular premature depolarization: Secondary | ICD-10-CM | POA: Diagnosis not present

## 2018-02-19 DIAGNOSIS — F32A Depression, unspecified: Secondary | ICD-10-CM | POA: Diagnosis present

## 2018-02-19 DIAGNOSIS — E876 Hypokalemia: Secondary | ICD-10-CM

## 2018-02-19 DIAGNOSIS — R0902 Hypoxemia: Secondary | ICD-10-CM | POA: Diagnosis not present

## 2018-02-19 DIAGNOSIS — F329 Major depressive disorder, single episode, unspecified: Secondary | ICD-10-CM | POA: Diagnosis not present

## 2018-02-19 DIAGNOSIS — J9601 Acute respiratory failure with hypoxia: Secondary | ICD-10-CM | POA: Diagnosis present

## 2018-02-19 DIAGNOSIS — Z6821 Body mass index (BMI) 21.0-21.9, adult: Secondary | ICD-10-CM | POA: Diagnosis not present

## 2018-02-19 DIAGNOSIS — E785 Hyperlipidemia, unspecified: Secondary | ICD-10-CM | POA: Diagnosis present

## 2018-02-19 DIAGNOSIS — Z79899 Other long term (current) drug therapy: Secondary | ICD-10-CM | POA: Diagnosis not present

## 2018-02-19 DIAGNOSIS — J441 Chronic obstructive pulmonary disease with (acute) exacerbation: Principal | ICD-10-CM | POA: Diagnosis present

## 2018-02-19 DIAGNOSIS — J440 Chronic obstructive pulmonary disease with acute lower respiratory infection: Secondary | ICD-10-CM | POA: Diagnosis present

## 2018-02-19 DIAGNOSIS — E440 Moderate protein-calorie malnutrition: Secondary | ICD-10-CM | POA: Diagnosis present

## 2018-02-19 DIAGNOSIS — I700 Atherosclerosis of aorta: Secondary | ICD-10-CM | POA: Diagnosis present

## 2018-02-19 DIAGNOSIS — J209 Acute bronchitis, unspecified: Secondary | ICD-10-CM | POA: Diagnosis not present

## 2018-02-19 DIAGNOSIS — K219 Gastro-esophageal reflux disease without esophagitis: Secondary | ICD-10-CM | POA: Diagnosis present

## 2018-02-19 DIAGNOSIS — R1084 Generalized abdominal pain: Secondary | ICD-10-CM | POA: Diagnosis not present

## 2018-02-19 LAB — CBC WITH DIFFERENTIAL/PLATELET
Basophils Absolute: 0 10*3/uL (ref 0.0–0.1)
Basophils Relative: 0 %
Eosinophils Absolute: 0 10*3/uL (ref 0.0–0.7)
Eosinophils Relative: 0 %
HCT: 36.8 % (ref 36.0–46.0)
Hemoglobin: 11.7 g/dL — ABNORMAL LOW (ref 12.0–15.0)
Lymphocytes Relative: 20 %
Lymphs Abs: 1.8 10*3/uL (ref 0.7–4.0)
MCH: 27.9 pg (ref 26.0–34.0)
MCHC: 31.8 g/dL (ref 30.0–36.0)
MCV: 87.6 fL (ref 78.0–100.0)
Monocytes Absolute: 0.7 10*3/uL (ref 0.1–1.0)
Monocytes Relative: 8 %
Neutro Abs: 6.3 10*3/uL (ref 1.7–7.7)
Neutrophils Relative %: 72 %
Platelets: 636 10*3/uL — ABNORMAL HIGH (ref 150–400)
RBC: 4.2 MIL/uL (ref 3.87–5.11)
RDW: 15.1 % (ref 11.5–15.5)
WBC: 8.8 10*3/uL (ref 4.0–10.5)

## 2018-02-19 LAB — COMPREHENSIVE METABOLIC PANEL
ALT: 8 U/L — ABNORMAL LOW (ref 14–54)
AST: 15 U/L (ref 15–41)
Albumin: 3.7 g/dL (ref 3.5–5.0)
Alkaline Phosphatase: 82 U/L (ref 38–126)
Anion gap: 13 (ref 5–15)
BUN: 6 mg/dL (ref 6–20)
CO2: 28 mmol/L (ref 22–32)
Calcium: 9 mg/dL (ref 8.9–10.3)
Chloride: 96 mmol/L — ABNORMAL LOW (ref 101–111)
Creatinine, Ser: 0.73 mg/dL (ref 0.44–1.00)
GFR calc Af Amer: >60 mL/min (ref >60)
GFR calc non Af Amer: >60 mL/min (ref >60)
Glucose, Bld: 101 mg/dL — ABNORMAL HIGH (ref 65–99)
Potassium: 2.7 mmol/L — CL (ref 3.5–5.1)
Sodium: 137 mmol/L (ref 135–145)
Total Bilirubin: 0.6 mg/dL (ref 0.3–1.2)
Total Protein: 7.3 g/dL (ref 6.5–8.1)

## 2018-02-19 LAB — D-DIMER, QUANTITATIVE (NOT AT ARMC): D-Dimer, Quant: 0.33 ug/mL-FEU (ref 0.00–0.50)

## 2018-02-19 LAB — I-STAT TROPONIN, ED: Troponin i, poc: 0 ng/mL (ref 0.00–0.08)

## 2018-02-19 LAB — MAGNESIUM: Magnesium: 1.7 mg/dL (ref 1.7–2.4)

## 2018-02-19 MED ORDER — ALBUTEROL SULFATE (2.5 MG/3ML) 0.083% IN NEBU
5.0000 mg | INHALATION_SOLUTION | Freq: Once | RESPIRATORY_TRACT | Status: AC
  Administered 2018-02-19: 5 mg via RESPIRATORY_TRACT
  Filled 2018-02-19: qty 6

## 2018-02-19 MED ORDER — AZITHROMYCIN 250 MG PO TABS
250.0000 mg | ORAL_TABLET | Freq: Every day | ORAL | Status: AC
  Administered 2018-02-20 – 2018-02-23 (×4): 250 mg via ORAL
  Filled 2018-02-19 (×4): qty 1

## 2018-02-19 MED ORDER — TIOTROPIUM BROMIDE MONOHYDRATE 18 MCG IN CAPS: 1.0000 | ORAL_CAPSULE | Freq: Every day | RESPIRATORY_TRACT | Status: DC

## 2018-02-19 MED ORDER — POTASSIUM CHLORIDE 10 MEQ/100ML IV SOLN
10.0000 meq | INTRAVENOUS | Status: AC
  Administered 2018-02-20: 10 meq via INTRAVENOUS
  Filled 2018-02-19: qty 100

## 2018-02-19 MED ORDER — GUAIFENESIN ER 600 MG PO TB12
1200.0000 mg | ORAL_TABLET | Freq: Two times a day (BID) | ORAL | Status: DC
  Administered 2018-02-20 – 2018-02-23 (×7): 1200 mg via ORAL
  Filled 2018-02-19 (×8): qty 2

## 2018-02-19 MED ORDER — ENOXAPARIN SODIUM 40 MG/0.4ML ~~LOC~~ SOLN
40.0000 mg | SUBCUTANEOUS | Status: DC
  Administered 2018-02-19 – 2018-02-22 (×4): 40 mg via SUBCUTANEOUS
  Filled 2018-02-19 (×4): qty 0.4

## 2018-02-19 MED ORDER — LEVOTHYROXINE SODIUM 25 MCG PO TABS
25.0000 ug | ORAL_TABLET | Freq: Every day | ORAL | Status: DC
  Administered 2018-02-20 – 2018-02-23 (×4): 25 ug via ORAL
  Filled 2018-02-19 (×4): qty 1

## 2018-02-19 MED ORDER — ACETAMINOPHEN 325 MG PO TABS
650.0000 mg | ORAL_TABLET | Freq: Four times a day (QID) | ORAL | Status: DC | PRN
  Administered 2018-02-22: 650 mg via ORAL
  Filled 2018-02-19: qty 2

## 2018-02-19 MED ORDER — METHYLPREDNISOLONE SODIUM SUCC 40 MG IJ SOLR
40.0000 mg | Freq: Two times a day (BID) | INTRAMUSCULAR | Status: DC
  Administered 2018-02-19 – 2018-02-22 (×6): 40 mg via INTRAVENOUS
  Filled 2018-02-19 (×6): qty 1

## 2018-02-19 MED ORDER — POTASSIUM CHLORIDE 10 MEQ/100ML IV SOLN
INTRAVENOUS | Status: AC
  Administered 2018-02-19: 10 meq
  Filled 2018-02-19: qty 200

## 2018-02-19 MED ORDER — CLORAZEPATE DIPOTASSIUM 7.5 MG PO TABS
7.5000 mg | ORAL_TABLET | Freq: Two times a day (BID) | ORAL | Status: DC | PRN
  Administered 2018-02-20 – 2018-02-22 (×3): 7.5 mg via ORAL
  Filled 2018-02-19 (×3): qty 1

## 2018-02-19 MED ORDER — PANTOPRAZOLE SODIUM 40 MG PO TBEC
40.0000 mg | DELAYED_RELEASE_TABLET | Freq: Every day | ORAL | Status: DC
  Administered 2018-02-20 – 2018-02-23 (×4): 40 mg via ORAL
  Filled 2018-02-19 (×4): qty 1

## 2018-02-19 MED ORDER — TRAZODONE HCL 50 MG PO TABS
25.0000 mg | ORAL_TABLET | Freq: Every evening | ORAL | Status: DC | PRN
  Administered 2018-02-19: 25 mg via ORAL
  Filled 2018-02-19 (×2): qty 1

## 2018-02-19 MED ORDER — POTASSIUM CHLORIDE CRYS ER 20 MEQ PO TBCR
40.0000 meq | EXTENDED_RELEASE_TABLET | Freq: Once | ORAL | Status: AC
  Administered 2018-02-19: 40 meq via ORAL
  Filled 2018-02-19: qty 2

## 2018-02-19 MED ORDER — ONDANSETRON HCL 4 MG/2ML IJ SOLN
4.0000 mg | Freq: Four times a day (QID) | INTRAMUSCULAR | Status: DC | PRN
  Administered 2018-02-20 – 2018-02-23 (×9): 4 mg via INTRAVENOUS
  Filled 2018-02-19 (×9): qty 2

## 2018-02-19 MED ORDER — AZITHROMYCIN 250 MG PO TABS
500.0000 mg | ORAL_TABLET | Freq: Every day | ORAL | Status: AC
  Administered 2018-02-19: 500 mg via ORAL
  Filled 2018-02-19: qty 2

## 2018-02-19 MED ORDER — POTASSIUM CHLORIDE CRYS ER 20 MEQ PO TBCR
40.0000 meq | EXTENDED_RELEASE_TABLET | ORAL | Status: AC
  Administered 2018-02-19: 40 meq via ORAL
  Filled 2018-02-19: qty 2

## 2018-02-19 MED ORDER — ENSURE ENLIVE PO LIQD
237.0000 mL | Freq: Two times a day (BID) | ORAL | Status: DC
  Administered 2018-02-21 (×2): 237 mL via ORAL

## 2018-02-19 MED ORDER — DILTIAZEM HCL ER COATED BEADS 240 MG PO CP24
240.0000 mg | ORAL_CAPSULE | Freq: Every day | ORAL | Status: DC
  Administered 2018-02-20 – 2018-02-23 (×4): 240 mg via ORAL
  Filled 2018-02-19 (×4): qty 1

## 2018-02-19 MED ORDER — IPRATROPIUM-ALBUTEROL 0.5-2.5 (3) MG/3ML IN SOLN
3.0000 mL | Freq: Four times a day (QID) | RESPIRATORY_TRACT | Status: DC
  Administered 2018-02-19 – 2018-02-21 (×8): 3 mL via RESPIRATORY_TRACT
  Filled 2018-02-19 (×9): qty 3

## 2018-02-19 MED ORDER — SODIUM CHLORIDE 0.9 % IV SOLN: INTRAVENOUS | Status: DC

## 2018-02-19 MED ORDER — ALBUTEROL SULFATE (2.5 MG/3ML) 0.083% IN NEBU: 2.5000 mg | INHALATION_SOLUTION | RESPIRATORY_TRACT | Status: DC | PRN

## 2018-02-19 MED ORDER — BENZONATATE 100 MG PO CAPS
100.0000 mg | ORAL_CAPSULE | Freq: Three times a day (TID) | ORAL | Status: DC
  Administered 2018-02-19 – 2018-02-23 (×11): 100 mg via ORAL
  Filled 2018-02-19 (×11): qty 1

## 2018-02-19 MED ORDER — ACETAMINOPHEN 650 MG RE SUPP: 650.0000 mg | Freq: Four times a day (QID) | RECTAL | Status: DC | PRN

## 2018-02-19 MED ORDER — TRAMADOL HCL 50 MG PO TABS
50.0000 mg | ORAL_TABLET | Freq: Four times a day (QID) | ORAL | Status: DC | PRN
  Administered 2018-02-19 – 2018-02-23 (×11): 50 mg via ORAL
  Filled 2018-02-19 (×11): qty 1

## 2018-02-19 MED ORDER — ONDANSETRON HCL 4 MG PO TABS
4.0000 mg | ORAL_TABLET | Freq: Four times a day (QID) | ORAL | Status: DC | PRN
Start: 2018-02-19 — End: 2018-02-23
  Administered 2018-02-20: 4 mg via ORAL
  Filled 2018-02-19: qty 1

## 2018-02-19 MED ORDER — TIOTROPIUM BROMIDE MONOHYDRATE 18 MCG IN CAPS
1.0000 | ORAL_CAPSULE | Freq: Every day | RESPIRATORY_TRACT | Status: DC
  Filled 2018-02-19: qty 5

## 2018-02-19 MED ORDER — SERTRALINE HCL 50 MG PO TABS
50.0000 mg | ORAL_TABLET | Freq: Every day | ORAL | Status: DC
  Administered 2018-02-20 – 2018-02-23 (×4): 50 mg via ORAL
  Filled 2018-02-19 (×4): qty 1

## 2018-02-19 NOTE — ED Provider Notes (Signed)
[facility] Provider Note   CSN: [id number] Arrival date & time: 02/19/18  1503     History   Chief Complaint Chief Complaint  Patient presents with  . Shortness of Breath    HPI Heather [name] is a 83 y.o. female.  The history is provided by the patient and medical records. No language interpreter was used.   Heather [name] is a 83 y.o. female who presents to the Emergency Department complaining of sob.  She was attempted to the hospital one month ago for COPD exacerbation.  Following hospital discharge she contracted the flu a week ago and was treated with tamiflu.  She reports cough - chronic but worse for the last 2-3 days.  Cough is productive of clear/thick sputum, whitish at time.  Denies fevers.  She is SOB unless on oxygen.  She did have some leg swelling after leaving the hospital but resolved after taking lasix.  Denies chest pain but does feel like her ribs are broke.  Sxs are moderate, constant, worsening.   Past Medical History:  Diagnosis Date  . ANXIETY 07/23/2009  . Atherosclerosis of aorta ([place]) 07/24/2015  . Complication of anesthesia 7 or 8 yrs ago   woke up during colonscopy  . Coronary atherosclerosis 07/24/2015  . DEPRESSION 07/23/2009  . DIVERTICULOSIS, COLON 07/23/2009  . GERD 07/23/2009  . Headache(784.0) 07/23/2009   occasional  . Hemorrhoids   . HIP PAIN, LEFT 05/16/2010  . History of Crohn's disease   . HYPERLIPIDEMIA 07/23/2009  . HYPERTENSION 07/23/2009  . HYPOTHYROIDISM 07/23/2009  . IBS (irritable bowel syndrome)   . Insomnia   . LOW BACK PAIN, CHRONIC 10/01/2009  . PARESTHESIA 10/01/2009  . Shingles 2006   back    Patient Active Problem List   Diagnosis Date Noted  . Hypokalemia 02/19/2018  . Oral thrush 01/29/2018  . Malnutrition of moderate degree 01/29/2018  . COPD with acute exacerbation ([place]) 01/28/2018  . 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 COPD exacerbation ([place]) 05/29/2016  .  Hypoxia 05/29/2016  . Coronary atherosclerosis 07/24/2015  . Atherosclerosis of aorta ([place]) 07/24/2015  . Dysphagia 07/24/2015  . Acute respiratory failure with hypoxia ([place]) 07/23/2015  . Bronchospasm with bronchitis, acute 07/23/2015  . HIP PAIN, LEFT 05/16/2010  . LOW BACK PAIN, CHRONIC 10/01/2009  . PARESTHESIA 10/01/2009  . Hypothyroidism 07/23/2009  . Dyslipidemia 07/23/2009  . Anxiety state 07/23/2009  . Anxiety and depression 07/23/2009  . Essential hypertension 07/23/2009  . GERD 07/23/2009  . DIVERTICULOSIS, COLON 07/23/2009  . HEADACHE 07/23/2009    Past Surgical History:  Procedure Laterality Date  . ABDOMINAL HYSTERECTOMY  age 37 or 28  . APPENDECTOMY  yrs ago  . BILATERAL SALPINGOOPHORECTOMY  age 68 or 18  . COLONOSCOPY N/A 11/17/2013   Procedure: COLONOSCOPY;  Surgeon: Cleotis Nipper, MD;  Location: [facility];  Service: Endoscopy;  Laterality: N/A;  . ESOPHAGOGASTRODUODENOSCOPY N/A 11/17/2013   Procedure: ESOPHAGOGASTRODUODENOSCOPY (EGD);  Surgeon: Cleotis Nipper, MD;  Location: [facility];  Service: Endoscopy;  Laterality: N/A;  . HEMORRHOID SURGERY  yrs ago  . TONSILLECTOMY  yrs ago    OB History    No data available       Home Medications    Prior to Admission medications   Medication Sig Start Date End Date Taking? Authorizing Provider  albuterol (PROVENTIL) (2.5 MG/3ML) 0.083% nebulizer solution INHALE CONTENTS OF 1 AMP VIA NEBULIZER EVERY 6 HRS AS NEEDED FOR WHEEZING/ SHORTNESS  OF BREATH. 12/17/17  Yes [name], MD  Albuterol Sulfate (PROAIR RESPICLICK) 539 (90 Base) MCG/ACT AEPB Inhale 2 puffs into the lungs 4 (four) times daily as needed. Patient taking differently: Inhale 2 puffs into the lungs 4 (four) times daily as needed (sob and wheezing).  01/26/18  Yes [name], MD  benzonatate (TESSALON) 100 MG capsule TAKE 1 CAPSULE TWICE DAILY AS NEEDED FOR COUGH. 02/11/18  Yes [name], MD  Cholecalciferol 2000  UNITS TABS Take 2,000 Units by mouth daily.   Yes [provider]  clorazepate (TRANXENE) 7.5 MG tablet TAKE (1) TABLET TWICE DAILY AS NEEDED FOR ANXIETY. 02/11/18  Yes Laurey Morale, MD  diltiazem (CARDIZEM CD) 240 MG 24 hr capsule TAKE (1) CAPSULE DAILY. 02/11/18  Yes [name], MD  esomeprazole (NEXIUM) 40 MG capsule TAKE 1 CAPSULE DAILY BEFORE BREAKFAST. 06/26/17  Yes [name], MD  levothyroxine (SYNTHROID, LEVOTHROID) 25 MCG tablet Take 1 tablet (25 mcg total) by mouth daily before breakfast. 06/26/17  Yes [name], MD  sertraline (ZOLOFT) 50 MG tablet Take 1 tablet (50 mg total) by mouth daily. 09/28/17  Yes [name], MD  tiotropium (SPIRIVA HANDIHALER) 18 MCG inhalation capsule Place 1 capsule (18 mcg total) into inhaler and inhale daily. 01/26/18  Yes [name], MD  traMADol (ULTRAM) 50 MG tablet Take 1 tablet (50 mg total) by mouth every 6 (six) hours as needed. 01/26/18  Yes [name], MD  traZODone (DESYREL) 50 MG tablet TAKE 1/2 TO 1 TABLET AT BEDTIME AS NEEDED FOR REST. 01/18/18  Yes [name], MD  zolpidem (AMBIEN) 5 MG tablet TAKE 1 TABLET AT BEDTIME AS NEEDED. Patient taking differently: TAKE 1 TABLET PO AT BEDTIME AS NEEDED FOR SLEEP 02/09/18  Yes Dorena Cookey, MD  Dextromethorphan-guaiFENesin ([place] FAST-MAX DM MAX) 5-100 MG/5ML LIQD Take 15 mLs by mouth every 4 (four) hours as needed (chest congestion).    [provider]  Polyethyl Glycol-Propyl Glycol (SYSTANE OP) Place 1 drop into both eyes 2 (two) times daily as needed (for dry eyes).     [provider]    Family History Family History  Problem Relation Age of Onset  . Cancer Mother        Unknown type  . COPD Sister   . Cancer Sister        Breast  . Clotting disorder Neg Hx     Social History Social History   Tobacco Use  . Smoking status: Never Smoker  . Smokeless tobacco: Never Used  . Tobacco comment:  smoked rarely in youth  Substance Use Topics  . Alcohol use: No    Alcohol/week: 0.0 oz  . Drug use: No     Allergies   Tylenol [acetaminophen]; Symbicort [budesonide-formoterol fumarate]; Duloxetine; Metronidazole; and Rofecoxib   Review of Systems Review of Systems  All other systems reviewed and are negative.    Physical Exam Updated Vital Signs BP (!) 122/46 (BP Location: Right Arm)   Pulse 81   Temp 98.4 F (36.9 C) (Oral)   Resp 18   Ht 4\' 11"  (1.499 m)   Wt 47.2 kg (104 lb)   SpO2 92%   BMI 21.01 kg/m   Physical Exam  Constitutional: She is oriented to person, place, and time. She appears well-developed and well-nourished.  HENT:  Head: Normocephalic and atraumatic.  Cardiovascular: Normal rate and regular rhythm.  No murmur heard. Pulmonary/Chest: No respiratory distress.  Decreased air movement in right lung base  Abdominal: Soft. There is no rebound and no guarding.  Mild generalized abdominal tenderness  Musculoskeletal: She exhibits no edema or tenderness.  Neurological: She is alert and oriented to person, place, and time.  Skin: Skin is warm and dry.  Psychiatric: She has a normal mood and affect. Her behavior is normal.  Nursing note and vitals reviewed.    ED Treatments / Results  Labs (all labs ordered are listed, but only abnormal results are displayed) Labs Reviewed  COMPREHENSIVE METABOLIC PANEL - Abnormal; Notable for the following components:      Result Value   Potassium 2.7 (*)    Chloride 96 (*)    Glucose, Bld 101 (*)    ALT 8 (*)    All other components within normal limits  CBC WITH DIFFERENTIAL/PLATELET - Abnormal; Notable for the following components:   Hemoglobin 11.7 (*)    Platelets 636 (*)    All other components within normal limits  D-DIMER, QUANTITATIVE (NOT AT [facility])  MAGNESIUM  BASIC METABOLIC PANEL  I-STAT TROPONIN, ED    EKG  EKG Interpretation  Date/Time:  Friday February 19 2018 16:18:47 EDT Ventricular  Rate:  72 PR Interval:    QRS Duration: 92 QT Interval:  379 QTC Calculation: 415 R Axis:   59 Text Interpretation:  Sinus rhythm Ventricular premature complex Borderline T abnormalities, inferior leads Confirmed by Quintella Reichert [phone]) on 02/19/2018 5:22:31 PM       Radiology Dg Chest 2 View  Result Date: 02/19/2018 CLINICAL DATA:  Shortness of Breath EXAM: CHEST - 2 VIEW COMPARISON:  None. FINDINGS: The heart size and mediastinal contours are within normal limits. Both lungs are clear. The visualized skeletal structures are unremarkable. IMPRESSION: No active cardiopulmonary disease. Electronically Signed   By: Inez Catalina M.D.   On: 02/19/2018 16:08    Procedures Procedures (including critical care time)  Medications Ordered in ED Medications  potassium chloride 10 mEq in 100 mL IVPB (not administered)  potassium chloride SA (K-DUR,KLOR-CON) CR tablet 40 mEq (40 mEq Oral Given 02/19/18 2219)  diltiazem (CARDIZEM CD) 24 hr capsule 240 mg (not administered)  levothyroxine (SYNTHROID, LEVOTHROID) tablet 25 mcg (not administered)  sertraline (ZOLOFT) tablet 50 mg (not administered)  traZODone (DESYREL) tablet 25 mg (25 mg Oral Given 02/19/18 2239)  traMADol (ULTRAM) tablet 50 mg (50 mg Oral Given 02/19/18 2240)  pantoprazole (PROTONIX) EC tablet 40 mg (not administered)  clorazepate (TRANXENE) tablet 7.5 mg (not administered)  guaiFENesin (MUCINEX) 12 hr tablet 1,200 mg (1,200 mg Oral Given 02/19/18 2220)  ipratropium-albuterol (DUONEB) 0.5-2.5 (3) MG/3ML nebulizer solution 3 mL (3 mLs Nebulization Given 02/19/18 2025)  albuterol (PROVENTIL) (2.5 MG/3ML) 0.083% nebulizer solution 2.5 mg (not administered)  0.9 %  sodium chloride infusion ( Intravenous Hold 02/19/18 2347)  azithromycin (ZITHROMAX) tablet 500 mg (500 mg Oral Given 02/19/18 2004)    Followed by  azithromycin (ZITHROMAX) tablet 250 mg (not administered)  methylPREDNISolone sodium succinate (SOLU-MEDROL) 40 mg/mL  injection 40 mg (40 mg Intravenous Given 02/19/18 2004)  feeding supplement (ENSURE ENLIVE) (ENSURE ENLIVE) liquid 237 mL (not administered)  benzonatate (TESSALON) capsule 100 mg (100 mg Oral Given 02/19/18 2219)  enoxaparin (LOVENOX) injection 40 mg (40 mg Subcutaneous Given 02/19/18 2221)  acetaminophen (TYLENOL) tablet 650 mg (not administered)    Or  acetaminophen (TYLENOL) suppository 650 mg (not administered)  ondansetron (ZOFRAN) tablet 4 mg (not administered)    Or  ondansetron (ZOFRAN) injection 4 mg (  not administered)  tiotropium (SPIRIVA) inhalation capsule 18 mcg (not administered)  potassium chloride SA (K-DUR,KLOR-CON) CR tablet 40 mEq (40 mEq Oral Given 02/19/18 1823)  albuterol (PROVENTIL) (2.5 MG/3ML) 0.083% nebulizer solution 5 mg (5 mg Nebulization Given 02/19/18 1749)  potassium chloride 10 MEQ/100ML IVPB (0 mEq  Hold 02/19/18 2352)     Initial Impression / Assessment and Plan / ED Course  I have reviewed the triage vital signs and the nursing notes.  Pertinent labs & imaging results that were available during my care of the patient were reviewed by me and considered in my medical decision making (see chart for details).    patient here for evaluation of shortness of breath with new oxygen requirement of 2 L. She is in no distress in the emergency department. BMP demonstrates hypokalemia. Treated with IV and oral replacement. Hospitalist consulted for admission for hypoxia and hypokalemia. Patient updated findings of studies recommendation for treatment  Final Clinical Impressions(s) / ED Diagnoses   Final diagnoses:  None    ED Discharge Orders    None       Quintella Reichert, MD 02/20/18 0104

## 2018-02-19 NOTE — ED Notes (Signed)
RT at bedside.

## 2018-02-19 NOTE — ED Notes (Signed)
ED TO INPATIENT HANDOFF REPORT  Name/Age/Gender Heather [name] 83 y.o. female  Code Status Code Status History    Date Active Date Inactive Code Status Order ID Comments User Context   01/28/2018 22:25 02/01/2018 19:03 Full Code [id number]  Karmen Bongo, MD Inpatient   05/29/2016 01:37 06/01/2016 19:03 Full Code [id number]  Karmen Bongo, MD Inpatient   07/23/2015 17:32 07/26/2015 18:02 Full Code [id number]  RamaVenetia Maxon, MD Inpatient   08/13/2014 20:11 08/16/2014 20:04 Full Code [id number]  Elgergawy, [name], MD Inpatient    Advance Directive Documentation     Most Recent Value  Type of Advance Directive  Living will  Pre-existing out of facility DNR order (yellow form or pink MOST form)  No data  "MOST" Form in Place?  No data      Home/SNF/Other Home  Chief Complaint Respitory Distress  Level of Care/Admitting Diagnosis ED Disposition    ED Disposition Condition Comment   Admit  Hospital Area: [facility] [[id number]]  Level of Care: Telemetry [5]  Admit to tele based on following criteria: Other see comments  Comments: hypokalemia  Diagnosis: Hypokalemia [364680]  Admitting Physician: [place], [place]  Attending Physician: Debbe Odea [[id number]]  PT Class (Do Not Modify): Observation [104]  PT Acc Code (Do Not Modify): Observation [10022]       Medical History Past Medical History:  Diagnosis Date  . ANXIETY 07/23/2009  . Atherosclerosis of aorta ([place]) 07/24/2015  . Complication of anesthesia 7 or 8 yrs ago   woke up during colonscopy  . Coronary atherosclerosis 07/24/2015  . DEPRESSION 07/23/2009  . DIVERTICULOSIS, COLON 07/23/2009  . GERD 07/23/2009  . Headache(784.0) 07/23/2009   occasional  . Hemorrhoids   . HIP PAIN, LEFT 05/16/2010  . History of Crohn's disease   . HYPERLIPIDEMIA 07/23/2009  . HYPERTENSION 07/23/2009  . HYPOTHYROIDISM 07/23/2009  . IBS (irritable bowel syndrome)   . Insomnia   . LOW BACK PAIN, CHRONIC 10/01/2009  .  PARESTHESIA 10/01/2009  . Shingles 2006   back    Allergies Allergies  Allergen Reactions  . Tylenol [Acetaminophen] Other (See Comments)    Nightmares  . Symbicort [Budesonide-Formoterol Fumarate] Other (See Comments)    Pt felt like her tongue was swollen, could not swallow  . Duloxetine Other (See Comments)    Patient doesn't recall  . Metronidazole Other (See Comments)    Patient doesn't recall   . Rofecoxib Other (See Comments)    Patient doesn't recall     IV Location/Drains/Wounds Patient Lines/Drains/Airways Status   Active Line/Drains/Airways    None          Labs/Imaging Results for orders placed or performed during the hospital encounter of 02/19/18 (from the past 48 hour(s))  Comprehensive metabolic panel     Status: Abnormal   Collection Time: 02/19/18  3:33 PM  Result Value Ref Range   Sodium 137 135 - 145 mmol/L   Potassium 2.7 (LL) 3.5 - 5.1 mmol/L    Comment: CRITICAL RESULT CALLED TO, READ BACK BY AND VERIFIED WITH: CLAPP,S @ 1656 ON [id number] BY POTEAT,S    Chloride 96 (L) 101 - 111 mmol/L   CO2 28 22 - 32 mmol/L   Glucose, Bld 101 (H) 65 - 99 mg/dL   BUN 6 6 - 20 mg/dL   Creatinine, Ser 0.73 0.44 - 1.00 mg/dL   Calcium 9.0 8.9 - 10.3 mg/dL   Total Protein 7.3 6.5 - 8.1 g/dL   Albumin 3.7 3.5 -  5.0 g/dL   AST 15 15 - 41 U/L   ALT 8 (L) 14 - 54 U/L   Alkaline Phosphatase 82 38 - 126 U/L   Total Bilirubin 0.6 0.3 - 1.2 mg/dL   GFR calc non Af Amer >60 >60 mL/min   GFR calc Af Amer >60 >60 mL/min    Comment: (NOTE) The eGFR has been calculated using the CKD EPI equation. This calculation has not been validated in all clinical situations. eGFR's persistently <60 mL/min signify possible Chronic Kidney Disease.    Anion gap 13 5 - 15    Comment: Performed at [facility], [place] [address] [place], [place] [address]  CBC with Differential     Status: Abnormal   Collection Time: 02/19/18  3:33 PM  Result Value Ref Range   WBC  8.8 4.0 - 10.5 K/uL   RBC 4.20 3.87 - 5.11 MIL/uL   Hemoglobin 11.7 (L) 12.0 - 15.0 g/dL   HCT 36.8 36.0 - 46.0 %   MCV 87.6 78.0 - 100.0 fL   MCH 27.9 26.0 - 34.0 pg   MCHC 31.8 30.0 - 36.0 g/dL   RDW 15.1 11.5 - 15.5 %   Platelets 636 (H) 150 - 400 K/uL   Neutrophils Relative % 72 %   Neutro Abs 6.3 1.7 - 7.7 K/uL   Lymphocytes Relative 20 %   Lymphs Abs 1.8 0.7 - 4.0 K/uL   Monocytes Relative 8 %   Monocytes Absolute 0.7 0.1 - 1.0 K/uL   Eosinophils Relative 0 %   Eosinophils Absolute 0.0 0.0 - 0.7 K/uL   Basophils Relative 0 %   Basophils Absolute 0.0 0.0 - 0.1 K/uL    Comment: Performed at [facility], [place] [address], [place], [place] [address]  I-stat troponin, ED     Status: None   Collection Time: 02/19/18  3:44 PM  Result Value Ref Range   Troponin i, poc 0.00 0.00 - 0.08 ng/mL   Comment 3            Comment: Due to the release kinetics of cTnI, a negative result within the first hours of the onset of symptoms does not rule out myocardial infarction with certainty. If myocardial infarction is still suspected, repeat the test at appropriate intervals.    Dg Chest 2 View  Result Date: 02/19/2018 CLINICAL DATA:  Shortness of Breath EXAM: CHEST - 2 VIEW COMPARISON:  None. FINDINGS: The heart size and mediastinal contours are within normal limits. Both lungs are clear. The visualized skeletal structures are unremarkable. IMPRESSION: No active cardiopulmonary disease. Electronically Signed   By: Inez Catalina M.D.   On: 02/19/2018 16:08    Pending Labs Unresulted Labs (From admission, onward)   Start     Ordered   02/20/18 7846  Basic metabolic panel  Tomorrow morning,   R     02/19/18 1723   02/19/18 1758  Magnesium  Add-on,   R     02/19/18 1757   02/19/18 1713  D-dimer, quantitative  Once,   STAT     02/19/18 1712      Vitals/Pain Today's Vitals   02/19/18 1509 02/19/18 1519 02/19/18 1520  BP:  (!) 125/56   Pulse:  85   Resp:  18    Temp:  98.4 F (36.9 C)   TempSrc:  Oral   SpO2: 97% 96%   Weight:   104 lb (47.2 kg)  Height:   '4\' 11"'  (1.499 m)  Isolation Precautions No active isolations  Medications Medications  potassium chloride SA (K-DUR,KLOR-CON) CR tablet 40 mEq (not administered)  potassium chloride 10 mEq in 100 mL IVPB (not administered)  potassium chloride SA (K-DUR,KLOR-CON) CR tablet 40 mEq (not administered)  diltiazem (CARDIZEM CD) 24 hr capsule 240 mg (not administered)  levothyroxine (SYNTHROID, LEVOTHROID) tablet 25 mcg (not administered)  sertraline (ZOLOFT) tablet 50 mg (not administered)  tiotropium (SPIRIVA) inhalation capsule 18 mcg (18 mcg Inhalation Not Given 02/19/18 1740)  traZODone (DESYREL) tablet 25 mg (not administered)  traMADol (ULTRAM) tablet 50 mg (not administered)  pantoprazole (PROTONIX) EC tablet 40 mg (not administered)  clorazepate (TRANXENE) tablet 7.5 mg (not administered)  guaiFENesin (MUCINEX) 12 hr tablet 1,200 mg (not administered)  ipratropium-albuterol (DUONEB) 0.5-2.5 (3) MG/3ML nebulizer solution 3 mL (not administered)  albuterol (PROVENTIL) (2.5 MG/3ML) 0.083% nebulizer solution 2.5 mg (not administered)  0.9 %  sodium chloride infusion (not administered)  azithromycin (ZITHROMAX) tablet 500 mg (not administered)    Followed by  azithromycin (ZITHROMAX) tablet 250 mg (not administered)  methylPREDNISolone sodium succinate (SOLU-MEDROL) 40 mg/mL injection 40 mg (not administered)  albuterol (PROVENTIL) (2.5 MG/3ML) 0.083% nebulizer solution 5 mg (5 mg Nebulization Given 02/19/18 1749)    Mobility walks

## 2018-02-19 NOTE — H&P (Signed)
History and Physical    Heather [name] LOV:[id number] DOB: 04/19/1935 DOA: 02/19/2018    PCP: [name], MD  Patient coming from: home  Chief Complaint: sent for low oxygen levels  HPI: Heather [name] is a 83 y.o. female with medical history of COPD, HTN, Hypothyroidism, depression/Anxiety who is sent by her PCP. She had the flu recently with high fevers cough and dyspnea. She finished the treatment with Tamiflu but her cough has not gone away. She is coughing up white/ clear sputum. Her home health RN checked her pulse ox today and it was in the 70s. She went to see her PCP who sent her to the ER. Her pulse ox checked by myself at rest drops to about 85%. He has chest pain when she coughs. No recurrence of fevers. No sinus drainage, sore throat or ear ache. She is short of breath with exertion Has not eaten anything in 2 days. Has lost about 8 lbs in the past couple of weeks.  ED Course: K 2.8  Review of Systems:  All other systems reviewed and apart from HPI, are negative.  Past Medical History:  Diagnosis Date  . ANXIETY 07/23/2009  . Atherosclerosis of aorta ([place]) 07/24/2015  . Complication of anesthesia 7 or 8 yrs ago   woke up during colonscopy  . Coronary atherosclerosis 07/24/2015  . DEPRESSION 07/23/2009  . DIVERTICULOSIS, COLON 07/23/2009  . GERD 07/23/2009  . Headache(784.0) 07/23/2009   occasional  . Hemorrhoids   . HIP PAIN, LEFT 05/16/2010  . History of Crohn's disease   . HYPERLIPIDEMIA 07/23/2009  . HYPERTENSION 07/23/2009  . HYPOTHYROIDISM 07/23/2009  . IBS (irritable bowel syndrome)   . Insomnia   . LOW BACK PAIN, CHRONIC 10/01/2009  . PARESTHESIA 10/01/2009  . Shingles 2006   back    Past Surgical History:  Procedure Laterality Date  . ABDOMINAL HYSTERECTOMY  age 33 or 80  . APPENDECTOMY  yrs ago  . BILATERAL SALPINGOOPHORECTOMY  age 63 or 52  . COLONOSCOPY N/A 11/17/2013   Procedure: COLONOSCOPY;  Surgeon: Cleotis Nipper, MD;  Location:  [facility];  Service: Endoscopy;  Laterality: N/A;  . ESOPHAGOGASTRODUODENOSCOPY N/A 11/17/2013   Procedure: ESOPHAGOGASTRODUODENOSCOPY (EGD);  Surgeon: Cleotis Nipper, MD;  Location: [facility];  Service: Endoscopy;  Laterality: N/A;  . HEMORRHOID SURGERY  yrs ago  . TONSILLECTOMY  yrs ago    Social History:   reports that  has never smoked. she has never used smokeless tobacco. She reports that she does not drink alcohol or use drugs.  Allergies  Allergen Reactions  . Tylenol [Acetaminophen] Other (See Comments)    Nightmares  . Symbicort [Budesonide-Formoterol Fumarate] Other (See Comments)    Pt felt like her tongue was swollen, could not swallow  . Duloxetine Other (See Comments)    Patient doesn't recall  . Metronidazole Other (See Comments)    Patient doesn't recall   . Rofecoxib Other (See Comments)    Patient doesn't recall     Family History  Problem Relation Age of Onset  . Cancer Mother        Unknown type  . COPD Sister   . Cancer Sister        Breast  . Clotting disorder Neg Hx      Prior to Admission medications   Medication Sig Start Date End Date Taking? Authorizing Provider  albuterol (PROVENTIL) (2.5 MG/3ML) 0.083% nebulizer solution INHALE CONTENTS OF 1 AMP VIA NEBULIZER EVERY 6 HRS AS  NEEDED FOR WHEEZING/ SHORTNESS OF BREATH. 12/17/17  Yes [name], MD  Albuterol Sulfate (PROAIR RESPICLICK) 213 (90 Base) MCG/ACT AEPB Inhale 2 puffs into the lungs 4 (four) times daily as needed. Patient taking differently: Inhale 2 puffs into the lungs 4 (four) times daily as needed (sob and wheezing).  01/26/18  Yes [name], MD  benzonatate (TESSALON) 100 MG capsule TAKE 1 CAPSULE TWICE DAILY AS NEEDED FOR COUGH. 02/11/18  Yes [name], MD  Cholecalciferol 2000 UNITS TABS Take 2,000 Units by mouth daily.   Yes [provider]  clorazepate (TRANXENE) 7.5 MG tablet TAKE (1) TABLET TWICE DAILY AS NEEDED FOR ANXIETY. 02/11/18   Yes Laurey Morale, MD  diltiazem (CARDIZEM CD) 240 MG 24 hr capsule TAKE (1) CAPSULE DAILY. 02/11/18  Yes [name], MD  esomeprazole (NEXIUM) 40 MG capsule TAKE 1 CAPSULE DAILY BEFORE BREAKFAST. 06/26/17  Yes [name], MD  levothyroxine (SYNTHROID, LEVOTHROID) 25 MCG tablet Take 1 tablet (25 mcg total) by mouth daily before breakfast. 06/26/17  Yes [name], MD  sertraline (ZOLOFT) 50 MG tablet Take 1 tablet (50 mg total) by mouth daily. 09/28/17  Yes [name], MD  tiotropium (SPIRIVA HANDIHALER) 18 MCG inhalation capsule Place 1 capsule (18 mcg total) into inhaler and inhale daily. 01/26/18  Yes [name], MD  traMADol (ULTRAM) 50 MG tablet Take 1 tablet (50 mg total) by mouth every 6 (six) hours as needed. 01/26/18  Yes [name], MD  traZODone (DESYREL) 50 MG tablet TAKE 1/2 TO 1 TABLET AT BEDTIME AS NEEDED FOR REST. 01/18/18  Yes [name], MD  zolpidem (AMBIEN) 5 MG tablet TAKE 1 TABLET AT BEDTIME AS NEEDED. Patient taking differently: TAKE 1 TABLET PO AT BEDTIME AS NEEDED FOR SLEEP 02/09/18  Yes Dorena Cookey, MD  Dextromethorphan-guaiFENesin ([place] FAST-MAX DM MAX) 5-100 MG/5ML LIQD Take 15 mLs by mouth every 4 (four) hours as needed (chest congestion).    [provider]  Polyethyl Glycol-Propyl Glycol (SYSTANE OP) Place 1 drop into both eyes 2 (two) times daily as needed (for dry eyes).     [provider]    Physical Exam: Wt Readings from Last 3 Encounters:  02/19/18 47.2 kg (104 lb)  02/19/18 47.4 kg (104 lb 8 oz)  01/28/18 49 kg (108 lb)   Vitals:   02/19/18 1509 02/19/18 1519 02/19/18 1520  BP:  (!) 125/56   Pulse:  85   Resp:  18   Temp:  98.4 F (36.9 C)   TempSrc:  Oral   SpO2: 97% 96%   Weight:   47.2 kg (104 lb)  Height:   4\' 11"  (1.499 m)      Constitutional: NAD, calm, comfortable Eyes: PERTLA, lids and conjunctivae normal ENMT: Mucous membranes are moist. Posterior  pharynx clear of any exudate or lesions. Normal dentition.  Neck: normal, supple, no masses, no thyromegaly Respiratory: very poor air movement, frequent cough, faint wheezing bilaterally - pulse ox at 85% on room air at rest Cardiovascular: S1 & S2 heard, regular rate and rhythm, no murmurs / rubs / gallops. No extremity edema. 2+ pedal pulses. No carotid bruits.  Abdomen: No distension, no tenderness, no masses palpated. No hepatosplenomegaly. Bowel sounds normal.  Musculoskeletal: no clubbing / cyanosis. No joint deformity upper and lower extremities. Good ROM, no contractures. Normal muscle tone.  Skin: no rashes, lesions, ulcers. No induration Neurologic: CN 2-12 grossly intact. Sensation intact, DTR  normal. Strength 5/5 in all 4 limbs.  Psychiatric: Normal judgment and insight. Alert and oriented x 3. Normal mood.     Labs on Admission: I have personally reviewed following labs and imaging studies  CBC: Recent Labs  Lab 02/19/18 1533  WBC 8.8  NEUTROABS 6.3  HGB 11.7*  HCT 36.8  MCV 87.6  PLT 443*   Basic Metabolic Panel: Recent Labs  Lab 02/19/18 1533  NA 137  K 2.7*  CL 96*  CO2 28  GLUCOSE 101*  BUN 6  CREATININE 0.73  CALCIUM 9.0   GFR: Estimated Creatinine Clearance: 38.3 mL/min (by C-G formula based on SCr of 0.73 mg/dL). Liver Function Tests: Recent Labs  Lab 02/19/18 1533  AST 15  ALT 8*  ALKPHOS 82  BILITOT 0.6  PROT 7.3  ALBUMIN 3.7   No results for input(s): LIPASE, AMYLASE in the last 168 hours. No results for input(s): AMMONIA in the last 168 hours. Coagulation Profile: No results for input(s): INR, PROTIME in the last 168 hours. Cardiac Enzymes: No results for input(s): CKTOTAL, CKMB, CKMBINDEX, TROPONINI in the last 168 hours. BNP (last 3 results) No results for input(s): PROBNP in the last 8760 hours. HbA1C: No results for input(s): HGBA1C in the last 72 hours. CBG: No results for input(s): GLUCAP in the last 168 hours. Lipid  Profile: No results for input(s): CHOL, HDL, LDLCALC, TRIG, CHOLHDL, LDLDIRECT in the last 72 hours. Thyroid Function Tests: No results for input(s): TSH, T4TOTAL, FREET4, T3FREE, THYROIDAB in the last 72 hours. Anemia Panel: No results for input(s): VITAMINB12, FOLATE, FERRITIN, TIBC, IRON, RETICCTPCT in the last 72 hours. Urine analysis:    Component Value Date/Time   COLORURINE AMBER (A) 08/22/2014 1105   APPEARANCEUR CLEAR 08/22/2014 1105   LABSPEC 1.011 08/22/2014 1105   PHURINE 6.0 08/22/2014 1105   GLUCOSEU NEGATIVE 08/22/2014 1105   HGBUR NEGATIVE 08/22/2014 1105   HGBUR trace-lysed 11/15/2010 1046   BILIRUBINUR NEGATIVE 08/22/2014 1105   KETONESUR NEGATIVE 08/22/2014 1105   PROTEINUR NEGATIVE 08/22/2014 1105   UROBILINOGEN 0.2 08/22/2014 1105   NITRITE NEGATIVE 08/22/2014 1105   LEUKOCYTESUR NEGATIVE 08/22/2014 1105   Sepsis Labs: @LABRCNTIP (procalcitonin:4,lacticidven:4) )No results found for this or any previous visit (from the past 240 hour(s)).   Radiological Exams on Admission: Dg Chest 2 View  Result Date: 02/19/2018 CLINICAL DATA:  Shortness of Breath EXAM: CHEST - 2 VIEW COMPARISON:  None. FINDINGS: The heart size and mediastinal contours are within normal limits. Both lungs are clear. The visualized skeletal structures are unremarkable. IMPRESSION: No active cardiopulmonary disease. Electronically Signed   By: Inez Catalina M.D.   On: 02/19/2018 16:08    EKG: Independently reviewed. Sinus rhythm with a PVC  Assessment/Plan Principal Problem:   Bronchospasm with bronchitis, acute   Acute respiratory failure with hypoxia - she does not wear O2 at baseline and now is quite hypoxic  -attempting to recuperate from the flu - admitted last month with a COPD exacebation - on exam, she sounds very congested, has very poor air entry with wheezing and a very frequent cough- chest hurts when she coughs - start Mucinex, Dextromethorphan, Tessalon, Nebs, flutter valve,  Solumedrol, Z pak - start slow IVF as hydrating her may help loosen the mucous - d dimer ordered by ER is pending  Active Problems:   Hypokalemia - treat aggressively with oral K, check Mg  Anorexia/ weight loss - Ensure    Hypothyroidism - Synthroid    Anxiety and depression - Takes  Tranxene in the AM along with Zoloft and a PRN Trazodone at bedtime    Essential hypertension - Cardizem with holding parameters   GERD? - cont PPI  DVT prophylaxis: Lovenox Code Status: Full code  Family Communication: son at bedside  Disposition Plan: admit to telemetry  Consults called: none  Admission status: inpatient    Debbe Odea MD [facility] Pager: [email or web address] Password [email or web address] 7PM-7AM, please contact night-coverage   02/19/2018, 5:53 PM

## 2018-02-19 NOTE — ED Triage Notes (Signed)
Per EMS, patient comes from [facility]. Pt has flu and been having difficulty breathing. Oxygen 76 on arrival. 3L pt to 96%. Not on oxygen at home. Hx COPD and CHF. No other complaints besides SOB. Rhonchi/wheezing. Pt was here a month ago for the same thing.

## 2018-02-19 NOTE — Progress Notes (Signed)
Pt received from ED. Pt is A/O x4. No skin issues noted. NS infusing into left forearm PIV at 55ml/hr. Pt denies pain at this time with no s/s of distress noted. Orders carried out

## 2018-02-19 NOTE — ED Notes (Signed)
Bed: WA08 Expected date:  Expected time:  Means of arrival:  Comments: Resp distress

## 2018-02-20 ENCOUNTER — Other Ambulatory Visit: Payer: Self-pay

## 2018-02-20 DIAGNOSIS — I10 Essential (primary) hypertension: Secondary | ICD-10-CM

## 2018-02-20 DIAGNOSIS — E876 Hypokalemia: Secondary | ICD-10-CM

## 2018-02-20 DIAGNOSIS — J209 Acute bronchitis, unspecified: Secondary | ICD-10-CM

## 2018-02-20 DIAGNOSIS — E039 Hypothyroidism, unspecified: Secondary | ICD-10-CM

## 2018-02-20 DIAGNOSIS — J441 Chronic obstructive pulmonary disease with (acute) exacerbation: Principal | ICD-10-CM

## 2018-02-20 LAB — BASIC METABOLIC PANEL
Anion gap: 11 (ref 5–15)
BUN: 7 mg/dL (ref 6–20)
CO2: 24 mmol/L (ref 22–32)
Calcium: 8.5 mg/dL — ABNORMAL LOW (ref 8.9–10.3)
Chloride: 104 mmol/L (ref 101–111)
Creatinine, Ser: 0.69 mg/dL (ref 0.44–1.00)
GFR calc Af Amer: >60 mL/min (ref >60)
GFR calc non Af Amer: >60 mL/min (ref >60)
Glucose, Bld: 170 mg/dL — ABNORMAL HIGH (ref 65–99)
Potassium: 4.9 mmol/L (ref 3.5–5.1)
Sodium: 139 mmol/L (ref 135–145)

## 2018-02-20 MED ORDER — MAGNESIUM SULFATE 2 GM/50ML IV SOLN
2.0000 g | Freq: Once | INTRAVENOUS | Status: AC
  Administered 2018-02-20: 2 g via INTRAVENOUS
  Filled 2018-02-20: qty 50

## 2018-02-20 NOTE — Progress Notes (Signed)
PROGRESS NOTE    Heather [name]  ZDG:[id number] DOB: 01/12/1935 DOA: 02/19/2018 PCP: [name], MD    Brief Narrative:  83 year old female who presented with worsening hypoxemia.  She does have a significant past medical history for COPD, hypertension, hypothyroidism, depression/anxiety.  Recent influenza infection.  Received treatment with oseltamivir.  Patient had persistent cough, dry in nature, associated with pleuritic chest pain.  In the outpatient clinic her oximetry was found to be down to 85%, she was referred to the hospital for further evaluation.  On the initial physical examination blood pressure 125 and 56, heart rate 85, respiratory 18, temperature 98.4, oxygen saturation 96% on submental oxygen.  Her lungs have significant decreased air movement, positive bilateral wheezing, no rhonchi, heart S1-S2 present and rhythmic, the abdomen was nontender and nondistended, soft, no lower extremity edema.  Sodium 137, potassium 2.7, chloride 96, bicarb 28, glucose 101, BUN 6, creatinine 0.73, white count 8.8, hemoglobin 9.7, hematocrit 36.8, platelets 636.  D-dimer 0.33. Chest x-ray with positive signs of hyperinflation.  EKG normal sinus rhythm, normal axis, normal intervals, positive PVC  Patient was admitted to the hospital with a working diagnosis of acute hypoxic respiratory failure due to COPD exacerbation.   Assessment & Plan:   Principal Problem:   Bronchospasm with bronchitis, acute Active Problems:   Hypothyroidism   Anxiety and depression   Essential hypertension   Acute respiratory failure with hypoxia (HCC)   COPD exacerbation (HCC)   Hypokalemia   1.  Acute hypoxic respite failure due to COPD exacerbation. Will continue aggressive bronchodilator therapy with duoneb, and systemic steroids. Continue oxymetry monitoring and supplemental 02 per [place]. Chest film personally reviewed, noted hyperinflation but no infiltrates.  Hold on tiotropium while on  ipratropium.   2.  Hypokalemia and hypomagnesemia. Continue k correction with kcl. K at 4,9 with preserved renal function with serum cr at 0.69. Will follow on renal panel in am. Mg 1.7. Will order Mg sulfate 2 grams.   3.  Hypertension. Blood pressure monitoring, continue diltiazem.   4.  Hypothyroidism. Continue levothyroxine with good toleration.  5. Depression. Will continue sertraline and trazodone  DVT prophylaxis: enoxaparin  Code Status:  full Family Communication: no family at the bedside Disposition Plan: home when clinically improved   Consultants:     Procedures:     Antimicrobials:   Azithromycin    Subjective: Dyspnea with mild improvement. Persistent wheezing, no chest pain, significant weakness, no nausea or vomiting.   Objective: Vitals:   02/20/18 0116 02/20/18 0545 02/20/18 1357 02/20/18 1400  BP:  (!) 96/35    Pulse:  71    Resp:  17    Temp:  97.7 F (36.5 C)    TempSrc:  Oral    SpO2: 94% 92% 97% 97%  Weight:      Height:        Intake/Output Summary (Last 24 hours) at 02/20/2018 1420 Last data filed at 02/20/2018 0900 Gross per 24 hour  Intake 480 ml  Output 450 ml  Net 30 ml   Filed Weights   02/19/18 1520  Weight: 47.2 kg (104 lb)    Examination:   General: Not in pain or dyspnea. deconditioned Neurology: Awake and alert, non focal  E ENT: mild pallor, no icterus, oral mucosa moist Cardiovascular: No JVD. S1-S2 present, rhythmic, no gallops, rubs, or murmurs. No lower extremity edema. Pulmonary: decreased breath sounds bilaterally, poor air movement, positive expiratory  wheezing, no rhonchi or rales. Gastrointestinal. Abdomen  no organomegaly, non tender, no rebound or guarding Skin. No rashes Musculoskeletal: no joint deformities     Data Reviewed: I have personally reviewed following labs and imaging studies  CBC: Recent Labs  Lab 02/19/18 1533  WBC 8.8  NEUTROABS 6.3  HGB 11.7*  HCT 36.8  MCV 87.6  PLT 636*     Basic Metabolic Panel: Recent Labs  Lab 02/19/18 1533 02/19/18 1909 02/20/18 0615  NA 137  --  139  K 2.7*  --  PENDING  CL 96*  --  104  CO2 28  --  24  GLUCOSE 101*  --  170*  BUN 6  --  7  CREATININE 0.73  --  0.69  CALCIUM 9.0  --  8.5*  MG  --  1.7  --    GFR: Estimated Creatinine Clearance: 38.3 mL/min (by C-G formula based on SCr of 0.69 mg/dL). Liver Function Tests: Recent Labs  Lab 02/19/18 1533  AST 15  ALT 8*  ALKPHOS 82  BILITOT 0.6  PROT 7.3  ALBUMIN 3.7   No results for input(s): LIPASE, AMYLASE in the last 168 hours. No results for input(s): AMMONIA in the last 168 hours. Coagulation Profile: No results for input(s): INR, PROTIME in the last 168 hours. Cardiac Enzymes: No results for input(s): CKTOTAL, CKMB, CKMBINDEX, TROPONINI in the last 168 hours. BNP (last 3 results) No results for input(s): PROBNP in the last 8760 hours. HbA1C: No results for input(s): HGBA1C in the last 72 hours. CBG: No results for input(s): GLUCAP in the last 168 hours. Lipid Profile: No results for input(s): CHOL, HDL, LDLCALC, TRIG, CHOLHDL, LDLDIRECT in the last 72 hours. Thyroid Function Tests: No results for input(s): TSH, T4TOTAL, FREET4, T3FREE, THYROIDAB in the last 72 hours. Anemia Panel: No results for input(s): VITAMINB12, FOLATE, FERRITIN, TIBC, IRON, RETICCTPCT in the last 72 hours.    Radiology Studies: I have reviewed all of the imaging during this hospital visit personally     Scheduled Meds: . azithromycin  250 mg Oral Daily  . benzonatate  100 mg Oral TID  . diltiazem  240 mg Oral Daily  . enoxaparin (LOVENOX) injection  40 mg Subcutaneous Q24H  . feeding supplement (ENSURE ENLIVE)  237 mL Oral BID BM  . guaiFENesin  1,200 mg Oral BID  . ipratropium-albuterol  3 mL Nebulization Q6H  . levothyroxine  25 mcg Oral QAC breakfast  . methylPREDNISolone (SOLU-MEDROL) injection  40 mg Intravenous Q12H  . pantoprazole  40 mg Oral Daily  .  sertraline  50 mg Oral Daily  . tiotropium  1 capsule Inhalation Daily   Continuous Infusions: . sodium chloride 50 mL/hr at 02/20/18 0139     LOS: 1 day        Tawni Millers, MD [facility] Pager [phone]

## 2018-02-20 NOTE — Assessment & Plan Note (Signed)
She is hypoxic here and needs further evaluation to identify cause - possible PNA Will send patient to ED at [facility] for further evaluation and treatment Patient and her son agrees with ED evaluation

## 2018-02-20 NOTE — Progress Notes (Signed)
Pt refused her breathing medication due to being nausea. No distress noted at this time.

## 2018-02-20 NOTE — Patient Instructions (Signed)
To go to [facility] ED for further evaluation

## 2018-02-21 LAB — BASIC METABOLIC PANEL
Anion gap: 8 (ref 5–15)
BUN: 9 mg/dL (ref 6–20)
CO2: 22 mmol/L (ref 22–32)
Calcium: 8.4 mg/dL — ABNORMAL LOW (ref 8.9–10.3)
Chloride: 108 mmol/L (ref 101–111)
Creatinine, Ser: 0.64 mg/dL (ref 0.44–1.00)
GFR calc Af Amer: >60 mL/min (ref >60)
GFR calc non Af Amer: >60 mL/min (ref >60)
Glucose, Bld: 183 mg/dL — ABNORMAL HIGH (ref 65–99)
Potassium: 5.1 mmol/L (ref 3.5–5.1)
Sodium: 138 mmol/L (ref 135–145)

## 2018-02-21 LAB — MAGNESIUM: Magnesium: 2.7 mg/dL — ABNORMAL HIGH (ref 1.7–2.4)

## 2018-02-21 MED ORDER — IPRATROPIUM-ALBUTEROL 0.5-2.5 (3) MG/3ML IN SOLN
3.0000 mL | Freq: Two times a day (BID) | RESPIRATORY_TRACT | Status: DC
  Administered 2018-02-22 – 2018-02-23 (×3): 3 mL via RESPIRATORY_TRACT
  Filled 2018-02-21 (×3): qty 3

## 2018-02-21 NOTE — Progress Notes (Signed)
PROGRESS NOTE    Heather [name]  SJG:[id number] DOB: July 16, 1935 DOA: 02/19/2018 PCP: [name], MD    Brief Narrative:  83 year old female who presented with worsening hypoxemia.  She does have a significant past medical history for COPD, hypertension, hypothyroidism, depression/anxiety.  Recent influenza infection.  Received treatment with oseltamivir.  Patient had persistent cough, dry in nature, associated with pleuritic chest pain.  In the outpatient clinic her oximetry was found to be down to 85%, she was referred to the hospital for further evaluation.  On the initial physical examination blood pressure 125 and 56, heart rate 85, respiratory 18, temperature 98.4, oxygen saturation 96% on submental oxygen.  Her lungs have significant decreased air movement, positive bilateral wheezing, no rhonchi, heart S1-S2 present and rhythmic, the abdomen was nontender and nondistended, soft, no lower extremity edema.  Sodium 137, potassium 2.7, chloride 96, bicarb 28, glucose 101, BUN 6, creatinine 0.73, white count 8.8, hemoglobin 9.7, hematocrit 36.8, platelets 636.  D-dimer 0.33. Chest x-ray with positive signs of hyperinflation.  EKG normal sinus rhythm, normal axis, normal intervals, positive PVC  Patient was admitted to the hospital with a working diagnosis of acute hypoxic respiratory failure due to COPD exacerbation.  Assessment & Plan:   Principal Problem:   Bronchospasm with bronchitis, acute Active Problems:   Hypothyroidism   Anxiety and depression   Essential hypertension   Acute respiratory failure with hypoxia (HCC)   COPD exacerbation (HCC)   Hypokalemia   1.  Acute hypoxic respite failure due to COPD exacerbation. Clinically improvement in her symptoms, but still not back to her baseline, will continue with aggressive bronchodilator therapy with duoneb, plus systemic steroids. Oxymetry 93 % on room air. Will allow to be out of bed tid with meals, physical therapy  evaluation and ambulation in preparation for possible dc in am.  2.  Hypokalemia and hypomagnesemia. Corrected K at 5,1 and Mg at 2,7, will continue to follow on electrolytes in am, renal functio with serum cr at 0.64, Avoid hypotension or nephrotoxic medications.   3.  Hypertension. On diltiazem with good blood pressure control, systolic 654 mmHg.    4.  Hypothyroidism. On levothyroxine.  5. Depression. On sertraline and trazodone, no confusion or agitation.   DVT prophylaxis: enoxaparin  Code Status:  full Family Communication: no family at the bedside Disposition Plan: home when clinically improved   Consultants:     Procedures:     Antimicrobials:   Azithromycin    Subjective: Patient with improved dyspnea, not back to baseline, improved appetite, no nausea or vomiting, no chest pain. Positive cough.   Objective: Vitals:   02/20/18 2232 02/21/18 0141 02/21/18 0433 02/21/18 0806  BP: (!) 86/55  (!) 117/47   Pulse:   76   Resp:   16   Temp:   97.7 F (36.5 C)   TempSrc:   Oral   SpO2:  95% 94% 90%  Weight:      Height:        Intake/Output Summary (Last 24 hours) at 02/21/2018 1302 Last data filed at 02/21/2018 0900 Gross per 24 hour  Intake 480 ml  Output 750 ml  Net -270 ml   Filed Weights   02/19/18 1520  Weight: 47.2 kg (104 lb)    Examination:   General: Not in pain deconditioned Neurology: Awake and alert, non focal  E ENT: mild pallor, no icterus, oral mucosa moist Cardiovascular: No JVD. S1-S2 present, rhythmic, no gallops, rubs, or murmurs. No  lower extremity edema. Pulmonary: decreased breath sounds bilaterally, improved air movement, positive expiratory wheezing, scattered rhonchi and rales bilaterally. Gastrointestinal. Abdomen with no organomegaly, non tender, no rebound or guarding Skin. No rashes Musculoskeletal: no joint deformities     Data Reviewed: I have personally reviewed following labs and imaging  studies  CBC: Recent Labs  Lab 02/19/18 1533  WBC 8.8  NEUTROABS 6.3  HGB 11.7*  HCT 36.8  MCV 87.6  PLT 270*   Basic Metabolic Panel: Recent Labs  Lab 02/19/18 1533 02/19/18 1909 02/20/18 0615 02/21/18 0602  NA 137  --  139 138  K 2.7*  --  4.9 5.1  CL 96*  --  104 108  CO2 28  --  24 22  GLUCOSE 101*  --  170* 183*  BUN 6  --  7 9  CREATININE 0.73  --  0.69 0.64  CALCIUM 9.0  --  8.5* 8.4*  MG  --  1.7  --  2.7*   GFR: Estimated Creatinine Clearance: 38.3 mL/min (by C-G formula based on SCr of 0.64 mg/dL). Liver Function Tests: Recent Labs  Lab 02/19/18 1533  AST 15  ALT 8*  ALKPHOS 82  BILITOT 0.6  PROT 7.3  ALBUMIN 3.7   No results for input(s): LIPASE, AMYLASE in the last 168 hours. No results for input(s): AMMONIA in the last 168 hours. Coagulation Profile: No results for input(s): INR, PROTIME in the last 168 hours. Cardiac Enzymes: No results for input(s): CKTOTAL, CKMB, CKMBINDEX, TROPONINI in the last 168 hours. BNP (last 3 results) No results for input(s): PROBNP in the last 8760 hours. HbA1C: No results for input(s): HGBA1C in the last 72 hours. CBG: No results for input(s): GLUCAP in the last 168 hours. Lipid Profile: No results for input(s): CHOL, HDL, LDLCALC, TRIG, CHOLHDL, LDLDIRECT in the last 72 hours. Thyroid Function Tests: No results for input(s): TSH, T4TOTAL, FREET4, T3FREE, THYROIDAB in the last 72 hours. Anemia Panel: No results for input(s): VITAMINB12, FOLATE, FERRITIN, TIBC, IRON, RETICCTPCT in the last 72 hours.    Radiology Studies: I have reviewed all of the imaging during this hospital visit personally     Scheduled Meds: . azithromycin  250 mg Oral Daily  . benzonatate  100 mg Oral TID  . diltiazem  240 mg Oral Daily  . enoxaparin (LOVENOX) injection  40 mg Subcutaneous Q24H  . feeding supplement (ENSURE ENLIVE)  237 mL Oral BID BM  . guaiFENesin  1,200 mg Oral BID  . ipratropium-albuterol  3 mL  Nebulization Q6H  . levothyroxine  25 mcg Oral QAC breakfast  . methylPREDNISolone (SOLU-MEDROL) injection  40 mg Intravenous Q12H  . pantoprazole  40 mg Oral Daily  . sertraline  50 mg Oral Daily   Continuous Infusions:   LOS: 2 days        Karlisha Mathena Gerome Apley, MD [facility] Pager [phone]

## 2018-02-21 NOTE — Progress Notes (Addendum)
Initial Nutrition Assessment  DOCUMENTATION CODES:   Non-severe (moderate) malnutrition in context of chronic illness  INTERVENTION:   Continue Ensure Enlive po BID, each supplement provides 350 kcal and 20 grams of protein  NUTRITION DIAGNOSIS:   Moderate Malnutrition related to chronic illness(COPD) as evidenced by percent weight loss, mild muscle depletion, mild fat depletion.  GOAL:   Patient will meet greater than or equal to 90% of their needs  MONITOR:   PO intake, Supplement acceptance, Labs, Weight trends, I & O's  REASON FOR ASSESSMENT:   Malnutrition Screening Tool    ASSESSMENT:   83 year old female who presented with worsening hypoxemia.  She does have a significant past medical history for COPD, hypertension, hypothyroidism, depression/anxiety.   Pt currently consuming 50-75% of meals. This morning pt consumed ~430 kcal and 13g protein. Would still benefit from nutritional supplements ,will continue Ensure order. Pt struggles with nausea but she is able to eat well when she takes nausea medications.   Per chart review, pt has los 7 lb since 2/19 (6% wt loss x 1 month, significant for time frame).   Medications: IV Zofran PRN Labs reviewed: Elevated  Mg  NUTRITION - FOCUSED PHYSICAL EXAM:    Most Recent Value  Orbital Region  Mild depletion  Upper Arm Region  No depletion  Thoracic and Lumbar Region  Unable to assess  Buccal Region  No depletion  Temple Region  Mild depletion  Clavicle Bone Region  No depletion  Clavicle and Acromion Bone Region  No depletion  Scapular Bone Region  Unable to assess  Dorsal Hand  Mild depletion  Patellar Region  Mild depletion  Anterior Thigh Region  Mild depletion  Posterior Calf Region  Mild depletion  Edema (RD Assessment)  None       Diet Order:  Diet regular Room service appropriate? Yes; Fluid consistency: Thin  EDUCATION NEEDS:   No education needs have been identified at this time  Skin:  Skin  Assessment: Reviewed RN Assessment  Last BM:  PTA  Height:   Ht Readings from Last 1 Encounters:  02/19/18 4\' 11"  (1.499 m)    Weight:   Wt Readings from Last 1 Encounters:  02/19/18 104 lb (47.2 kg)    Ideal Body Weight:  44.7 kg  BMI:  Body mass index is 21.01 kg/m.  Estimated Nutritional Needs:   Kcal:  1400-1600  Protein:  65-75g  Fluid:  1.6L/day  Clayton Bibles, MS, RD, LDN [place] [facility] Pager: [phone] After Hours Pager: [phone]

## 2018-02-22 DIAGNOSIS — J9601 Acute respiratory failure with hypoxia: Secondary | ICD-10-CM

## 2018-02-22 LAB — BASIC METABOLIC PANEL
Anion gap: 7 (ref 5–15)
BUN: 11 mg/dL (ref 6–20)
CO2: 24 mmol/L (ref 22–32)
Calcium: 8.5 mg/dL — ABNORMAL LOW (ref 8.9–10.3)
Chloride: 108 mmol/L (ref 101–111)
Creatinine, Ser: 0.63 mg/dL (ref 0.44–1.00)
GFR calc Af Amer: >60 mL/min (ref >60)
GFR calc non Af Amer: >60 mL/min (ref >60)
Glucose, Bld: 138 mg/dL — ABNORMAL HIGH (ref 65–99)
Potassium: 5 mmol/L (ref 3.5–5.1)
Sodium: 139 mmol/L (ref 135–145)

## 2018-02-22 MED ORDER — METHYLPREDNISOLONE SODIUM SUCC 40 MG IJ SOLR
40.0000 mg | Freq: Every day | INTRAMUSCULAR | Status: DC
  Administered 2018-02-23: 40 mg via INTRAVENOUS
  Filled 2018-02-22: qty 1

## 2018-02-22 MED ORDER — BENZONATATE 100 MG PO CAPS
200.0000 mg | ORAL_CAPSULE | Freq: Three times a day (TID) | ORAL | Status: DC | PRN
  Administered 2018-02-22: 200 mg via ORAL
  Filled 2018-02-22: qty 2

## 2018-02-22 NOTE — Progress Notes (Addendum)
PROGRESS NOTE    Heather [name]  TKP:[id number] DOB: 07-02-1935 DOA: 02/19/2018 PCP: [name], MD    Brief Narrative:  83 year old female who presented with worsening hypoxemia. She does have a significant past medical history for COPD, hypertension, hypothyroidism, depression/anxiety. Recent influenza infection. Received treatment with oseltamivir.Patient had persistent cough,dry in nature, associated with pleuritic chest pain.In the outpatient clinic her oximetry was found to be down to 85%,she was referred to the hospital for further evaluation. On the initial physical examination blood pressure 125 and 56, heart rate 85, respiratory 18, temperature 98.4, oxygen saturation 96% on submental oxygen. Her lungs have significant decreased air movement, positive bilateral wheezing, no rhonchi, heart S1-S2 present and rhythmic, the abdomen was nontender and nondistended, soft, no lower extremity edema.Sodium 137, potassium 2.7, chloride 96, bicarb 28, glucose 101, BUN 6, creatinine 0.73, white count 8.8, hemoglobin 9.7, hematocrit 36.8, platelets 636.D-dimer 0.33. Chest x-ray with positive signs of hyperinflation.EKG normal sinus rhythm, normal axis, normal intervals, positive PVC  Patient was admitted to the hospital with a working diagnosis of acute hypoxic respiratory failure due to COPD exacerbation.   Assessment & Plan:   Principal Problem:   Bronchospasm with bronchitis, acute Active Problems:   Hypothyroidism   Anxiety and depression   Essential hypertension   Acute respiratory failure with hypoxia (HCC)   COPD exacerbation (HCC)   Hypokalemia    1.Acute hypoxic respite failure due to COPD exacerbation. Continue slow improvement, close to baseline her baseline. Continue  bronchodilator therapy with duoneb, taper steroids. Improved oxymetry, now at 92 % on room air. Out of bed as tolerated. Azithromycin po for total of 5 days.   2.Hypokalemia and  hypomagnesemia. Improved electrolytes, will continue to avoid nephrotoxic medications or hypotension. Patient tolerating po well, no nausea or vomiting, renal function preserved.   3.Hypertension. Continueu with diltiazem for blood pressure control.  4.Hypothyroidism. Continue with levothyroxine per home regimen.  5. Depression. Continue sertraline and trazodone.   DVT prophylaxis:enoxaparin Code Status:full Family Communication:no family at the bedside Disposition Plan:Plan for dc 03/19 of continue clinical improvement, will need home health and home pt.    Consultants:    Procedures:    Antimicrobials:  Azithromycin   Subjective: Dyspnea continue to improve but not back to baseline, positive weakness, no nausea or vomiting, no chest pain.   Objective: Vitals:   02/21/18 2200 02/22/18 0521 02/22/18 0629 02/22/18 0810  BP:  (!) 122/42 (!) 123/49   Pulse:  71    Resp:  18    Temp: 98.3 F (36.8 C) 98.3 F (36.8 C)    TempSrc: Oral Oral    SpO2:  93%  91%  Weight:      Height:        Intake/Output Summary (Last 24 hours) at 02/22/2018 1511 Last data filed at 02/22/2018 0851 Gross per 24 hour  Intake 360 ml  Output -  Net 360 ml   Filed Weights   02/19/18 1520  Weight: 47.2 kg (104 lb)    Examination:   General: Not in pain or dyspnea, deconditioned Neurology: Awake and alert, non focal  E ENT: mild pallor, no icterus, oral mucosa moist Cardiovascular: No JVD. S1-S2 present, rhythmic, no gallops, rubs, or murmurs. No lower extremity edema. Pulmonary: decreased breath sounds bilaterally, poor air movement, positive expiratory wheezing, scattered rhonchi and rales, bilaterally. Gastrointestinal. Abdomen flat, no organomegaly, non tender, no rebound or guarding Skin. No rashes Musculoskeletal: no joint deformities     Data Reviewed:  I have personally reviewed following labs and imaging studies  CBC: Recent Labs  Lab  02/19/18 1533  WBC 8.8  NEUTROABS 6.3  HGB 11.7*  HCT 36.8  MCV 87.6  PLT 322*   Basic Metabolic Panel: Recent Labs  Lab 02/19/18 1533 02/19/18 1909 02/20/18 0615 02/21/18 0602 02/22/18 0548  NA 137  --  139 138 139  K 2.7*  --  4.9 5.1 5.0  CL 96*  --  104 108 108  CO2 28  --  24 22 24   GLUCOSE 101*  --  170* 183* 138*  BUN 6  --  7 9 11   CREATININE 0.73  --  0.69 0.64 0.63  CALCIUM 9.0  --  8.5* 8.4* 8.5*  MG  --  1.7  --  2.7*  --    GFR: Estimated Creatinine Clearance: 38.3 mL/min (by C-G formula based on SCr of 0.63 mg/dL). Liver Function Tests: Recent Labs  Lab 02/19/18 1533  AST 15  ALT 8*  ALKPHOS 82  BILITOT 0.6  PROT 7.3  ALBUMIN 3.7   No results for input(s): LIPASE, AMYLASE in the last 168 hours. No results for input(s): AMMONIA in the last 168 hours. Coagulation Profile: No results for input(s): INR, PROTIME in the last 168 hours. Cardiac Enzymes: No results for input(s): CKTOTAL, CKMB, CKMBINDEX, TROPONINI in the last 168 hours. BNP (last 3 results) No results for input(s): PROBNP in the last 8760 hours. HbA1C: No results for input(s): HGBA1C in the last 72 hours. CBG: No results for input(s): GLUCAP in the last 168 hours. Lipid Profile: No results for input(s): CHOL, HDL, LDLCALC, TRIG, CHOLHDL, LDLDIRECT in the last 72 hours. Thyroid Function Tests: No results for input(s): TSH, T4TOTAL, FREET4, T3FREE, THYROIDAB in the last 72 hours. Anemia Panel: No results for input(s): VITAMINB12, FOLATE, FERRITIN, TIBC, IRON, RETICCTPCT in the last 72 hours.    Radiology Studies: I have reviewed all of the imaging during this hospital visit personally     Scheduled Meds: . azithromycin  250 mg Oral Daily  . benzonatate  100 mg Oral TID  . diltiazem  240 mg Oral Daily  . enoxaparin (LOVENOX) injection  40 mg Subcutaneous Q24H  . feeding supplement (ENSURE ENLIVE)  237 mL Oral BID BM  . guaiFENesin  1,200 mg Oral BID  . ipratropium-albuterol   3 mL Nebulization BID  . levothyroxine  25 mcg Oral QAC breakfast  . [START ON 02/23/2018] methylPREDNISolone (SOLU-MEDROL) injection  40 mg Intravenous Daily  . pantoprazole  40 mg Oral Daily  . sertraline  50 mg Oral Daily   Continuous Infusions:   LOS: 3 days        Nadira Single Gerome Apley, MD [facility] Pager [phone]

## 2018-02-22 NOTE — Care Management Note (Signed)
Case Management Note  Patient Details  Name: MACENZIE BURFORD MRN: [id number] Date of Birth: 04/14/1935  Subjective/Objective:   Pt admitted with COPD, Bronchospasm with Acute Bronchitis.               Action/Plan:Pt plan to discharge home with [facility], HHRN/PT/NA.    Expected Discharge Date:  (unknown)               Expected Discharge Plan:  [place]  In-House Referral:     Discharge planning Services  CM Consult  Post Acute Care Choice:  Home Health Choice offered to:  Patient  DME Arranged:    DME Agency:     HH Arranged:  RN, PT, OT, Nurse's Aide [place] Agency:  [facility]  Status of Service:  Completed, signed off  If discussed at [place] of Stay Meetings, dates discussed:    Additional CommentsPurcell Mouton, RN 02/22/2018, 1:24 PM

## 2018-02-22 NOTE — Evaluation (Signed)
Physical Therapy Evaluation Patient Details Name: Heather [name] MRN: [id number] DOB: 11-15-1935 Today's Date: 02/22/2018   History of Present Illness  83 yo female admitted with acute hypoxic resp failure, COPD exac. Hx of COPD, depression, HTN, CAD. Recent d/c from hospital 02/01/18    Clinical Impression  On eval, pt required Min assist for mobility. She walked ~40 feet with a RW. Pt presents with general weakness, decreased activity tolerance, and impaired gait and balance. O2 sat 90% on RA during session. Discussed d/c plan-pt stated she plans to return home. She does not wish to go anywhere for rehab. Recommend HHPT f/u. Pt may need increased aide services. Will follow and progress activity as tolerated.     Follow Up Recommendations Home health PT;Supervision for mobility/OOB    Equipment Recommendations  None recommended by PT    Recommendations for Other Services       Precautions / Restrictions Precautions Precautions: Fall Precaution Comments: 3 falls in past 1 year. monitor O2 sats Restrictions Weight Bearing Restrictions: No      Mobility  Bed Mobility Overal bed mobility: Needs Assistance Bed Mobility: Supine to Sit;Sit to Supine     Supine to sit: Supervision;HOB elevated Sit to supine: Supervision;HOB elevated   General bed mobility comments: for safety. increased time.  Transfers Overall transfer level: Needs assistance Equipment used: Rolling walker (2 wheeled) Transfers: Sit to/from Stand Sit to Stand: Min guard         General transfer comment: VCs safety, technique, hand placement. Increased time. Pt relied heavily on walker.   Ambulation/Gait Ambulation/Gait assistance: Min assist Ambulation Distance (Feet): 40 Feet Assistive device: Rolling walker (2 wheeled) Gait Pattern/deviations: Step-through pattern;Decreased stride length     General Gait Details: slow gait speed. Intermittent buckling noted. LE unsteadiness/shakiness noted.  Assist to stabilize pt intermittently. She fatigues easily. O2 sat 90% on Ra, dyspnea 2/4  Stairs            Wheelchair Mobility    Modified Rankin (Stroke Patients Only)       Balance Overall balance assessment: Mild deficits observed, not formally tested;History of Falls                                           Pertinent Vitals/Pain Pain Assessment: No/denies pain    Home Living Family/patient expects to be discharged to:: Private residence Living Arrangements: Alone Available Help at Discharge: Family;Available PRN/intermittently Type of Home: House Home Access: Stairs to enter   [employer] of Steps: 4 with rail and quad cane Home Layout: One level Home Equipment: Kasandra Knudsen - quad;Walker - 2 wheels;Shower seat;Hand held shower head      Prior Function Level of Independence: Needs assistance   Gait / Transfers Assistance Needed: using RW most recently. was using a quad cane prior to previous admission  ADL's / Homemaking Assistance Needed: aide assists with ADLs  Comments: pt reports son still helps clean and brings her groceries every 2 weeks. she doesnt go out of the house without assist     Hand Dominance        Extremity/Trunk Assessment   Upper Extremity Assessment Upper Extremity Assessment: Generalized weakness    Lower Extremity Assessment Lower Extremity Assessment: Generalized weakness    Cervical / Trunk Assessment Cervical / Trunk Assessment: Kyphotic  Communication   Communication: No difficulties  Cognition Arousal/Alertness: Awake/alert Behavior During Therapy: [facility]  for tasks assessed/performed Overall Cognitive Status: Within Functional Limits for tasks assessed                                        General Comments      Exercises     Assessment/Plan    PT Assessment Patient needs continued PT services  PT Problem List Decreased strength;Decreased balance;Decreased  mobility;Decreased activity tolerance;Decreased knowledge of use of DME;Decreased safety awareness       PT Treatment Interventions DME instruction;Gait training;Functional mobility training;Therapeutic activities;Balance training;Patient/family education;Therapeutic exercise    PT Goals (Current goals can be found in the Care Plan section)  Acute Rehab PT Goals Patient Stated Goal: to do OK at home  PT Goal Formulation: With patient Time For Goal Achievement: 03/08/18 Potential to Achieve Goals: Good    Frequency Min 3X/week   Barriers to discharge        Co-evaluation               AM-PAC PT "6 Clicks" Daily Activity  Outcome Measure Difficulty turning over in bed (including adjusting bedclothes, sheets and blankets)?: A Little Difficulty moving from lying on back to sitting on the side of the bed? : A Little Difficulty sitting down on and standing up from a chair with arms (e.g., wheelchair, bedside commode, etc,.)?: A Little Help needed moving to and from a bed to chair (including a wheelchair)?: A Little Help needed walking in hospital room?: A Little Help needed climbing 3-5 steps with a railing? : A Lot 6 Click Score: 17    End of Session Equipment Utilized During Treatment: Gait belt Activity Tolerance: Patient limited by fatigue Patient left: in bed;with call bell/phone within reach   PT Visit Diagnosis: Difficulty in walking, not elsewhere classified (R26.2);Muscle weakness (generalized) (M62.81);Unsteadiness on feet (R26.81);History of falling (Z91.81)    Time: 1111-1130 PT Time Calculation (min) (ACUTE ONLY): 19 min   Charges:   PT Evaluation $PT Eval Moderate Complexity: 1 Mod     PT G Codes:          Weston Anna, MPT Pager: [phone]

## 2018-02-22 NOTE — Care Management Important Message (Signed)
Important Message  Patient Details  Name: Heather [name] MRN: [id number] Date of Birth: 1935-07-23   Medicare Important Message Given:  Yes    Kerin Salen 02/22/2018, 10:40 AMImportant Message  Patient Details  Name: Heather [name] MRN: [id number] Date of Birth: 03-14-1935   Medicare Important Message Given:  Yes    Kerin Salen 02/22/2018, 10:40 AM

## 2018-02-22 NOTE — Consult Note (Signed)
   [facility] CM Inpatient Consult   02/22/2018  Heather [name] [facility] October 08, 1935 [id number]    [facility] Care Management referral received. Made inpatient RNCM aware writer will follow up tomorrow at bedside.   [name], MSN-Ed, RN,BSN [facility] Liaison [phone]

## 2018-02-23 MED ORDER — PREDNISONE 10 MG PO TABS: ORAL_TABLET | ORAL | 0 refills | Status: DC

## 2018-02-23 MED ORDER — MOMETASONE FURO-FORMOTEROL FUM 200-5 MCG/ACT IN AERO: 2.0000 | INHALATION_SPRAY | Freq: Two times a day (BID) | RESPIRATORY_TRACT | 0 refills | Status: DC

## 2018-02-23 NOTE — Consult Note (Addendum)
   [facility] CM Inpatient Consult   02/23/2018  Heather [name] [facility] January 14, 1935 [id number]   [facility] Care Management follow up.   Referral received from inpatient [facility] for COPD management.   Spoke with Heather [name] at bedside. Explained and offered [facility] program for disease and symptom management for COPD. She is agreeable and written consent obtained. [facility] Care Management folder provided.   Heather [name] lives alone.  Reports she has [facility].  Son Heather [name] takes patient to MD appointments. Heather [name] uses [employer] for medication delivery. Confirmed Primary Care MD is Dr. Burnice Logan. Primary Care office  ([facility]) is listed as doing their own transition of care call post discharge.  Explained [facility] Care Management will not interfere or replace services provided by home health.   Will refer to [facility] for COPD education and disease management.   Will make inpatient RNCM aware [place] Management will follow.   Addendum: Heather [name] requests that she be called after 11am because it take her a while to take her medications in the morning.    [name], MSN-Ed, RN,BSN [facility] Liaison [phone]

## 2018-02-23 NOTE — Discharge Summary (Signed)
Physician Discharge Summary  TAMEAKA EICHHORN  FIE:[id number]  DOB: 05-20-1935  DOA: 02/19/2018 PCP: [name], MD  Admit date: 02/19/2018 Discharge date: 02/23/2018  Admitted From: Home Disposition: Home  Recommendations for Outpatient Follow-up:  1. Follow up with PCP in 1 week 2. Please obtain BMP/CBC in one week to monitor hemoglobin and renal function 3. May need PFTs as an outpatient.  Home Health: PT/respiratory  Discharge Condition: Stable CODE STATUS: Full code Diet recommendation: Heart Healthy  Brief/Interim Summary: For full details see H&P/Progress note, but in brief, Heather [name] is a 83 year old female with past medical history of COPD, hypertension, hypothyroidism, depression/anxiety who presented to the emergency department after having dyspnea and hypoxia at outpatient clinic.  Upon initial evaluation chest x-ray showed signs of hyperinflation, d-dimer was negative.  Patient was found to have bilateral wheezing with decreasing air entry and patient was admitted with acute hypoxic respiratory failure due to COPD exacerbation.  She was treated with IV steroids, nebulizer treatment and azithromycin, subsequently oxygen was weaned off and she was saturating well while on room air.  Patient significantly improved and was deemed stable for discharge and follow-up with primary care physician.  Subjective: Patient seen and examined, she reported that she slept very well.  Denies nausea and vomiting.  Breathing has significantly improved.  Complaining of slight wheezing otherwise no shortness of breath.  Denies palpitation, dizziness and weakness.  Discharge Diagnoses/Hospital Course:  Acute respiratory failure due to COPD exacerbation Breathing has significantly improved for now at baseline.  Patient was treated with nebulizer therapy and IV steroids which will be transitioned to oral steroid for a taper of 14 days.  Pleated treatment of azithromycin.  Will  discharge on Spiriva daily, Dulera twice daily (?Allergies to Symbicort in 2014 patient does not recall any major issues, and ok to try [facility]) and albuterol as needed.  Prednisone taper for 14 days. Follow-up with PCP in 1 week.   Hypokalemia/hypomagnesemia Resolved  Hypertension Blood pressure stable during hospital stay Continue home medications without any changes  Hypothyroidism Stable continue Synthroid  Depression Continue sertraline and trazodone per home dose  All other chronic medical condition were stable during the hospitalization.  Patient was seen by physical therapy, [facility] PT On the day of the discharge the patient's vitals were stable, and no other acute medical condition were reported by patient. the patient was felt safe to be discharge to home  Discharge Instructions  You were cared for by a hospitalist during your hospital stay. If you have any questions about your discharge medications or the care you received while you were in the hospital after you are discharged, you can call the unit and asked to speak with the hospitalist on call if the hospitalist that took care of you is not available. Once you are discharged, your primary care physician will handle any further medical issues. Please note that NO REFILLS for any discharge medications will be authorized once you are discharged, as it is imperative that you return to your primary care physician (or establish a relationship with a primary care physician if you do not have one) for your aftercare needs so that they can reassess your need for medications and monitor your lab values.  Discharge Instructions    Call MD for:  difficulty breathing, headache or visual disturbances   Complete by:  As directed    Call MD for:  extreme fatigue   Complete by:  As directed  Call MD for:  hives   Complete by:  As directed    Call MD for:  persistant dizziness or light-headedness   Complete by:  As  directed    Call MD for:  persistant nausea and vomiting   Complete by:  As directed    Call MD for:  redness, tenderness, or signs of infection (pain, swelling, redness, odor or green/yellow discharge around incision site)   Complete by:  As directed    Call MD for:  severe uncontrolled pain   Complete by:  As directed    Call MD for:  temperature >100.4   Complete by:  As directed    Diet - low sodium heart healthy   Complete by:  As directed    Increase activity slowly   Complete by:  As directed      Allergies as of 02/23/2018      Reactions   Tylenol [acetaminophen] Other (See Comments)   Nightmares   Symbicort [budesonide-formoterol Fumarate] Other (See Comments)   Pt felt like her tongue was swollen, could not swallow   Duloxetine Other (See Comments)   Patient doesn't recall   Metronidazole Other (See Comments)   Patient doesn't recall   Rofecoxib Other (See Comments)   Patient doesn't recall      Medication List    TAKE these medications   albuterol (2.5 MG/3ML) 0.083% nebulizer solution Commonly known as:  PROVENTIL INHALE CONTENTS OF 1 AMP VIA NEBULIZER EVERY 6 HRS AS NEEDED FOR WHEEZING/ SHORTNESS OF BREATH. What changed:  Another medication with the same name was changed. Make sure you understand how and when to take each.   Albuterol Sulfate 108 (90 Base) MCG/ACT Aepb Commonly known as:  PROAIR RESPICLICK Inhale 2 puffs into the lungs 4 (four) times daily as needed. What changed:  reasons to take this   benzonatate 100 MG capsule Commonly known as:  TESSALON TAKE 1 CAPSULE TWICE DAILY AS NEEDED FOR COUGH.   Cholecalciferol 2000 units Tabs Take 2,000 Units by mouth daily.   clorazepate 7.5 MG tablet Commonly known as:  TRANXENE TAKE (1) TABLET TWICE DAILY AS NEEDED FOR ANXIETY.   diltiazem 240 MG 24 hr capsule Commonly known as:  CARDIZEM CD TAKE (1) CAPSULE DAILY.   esomeprazole 40 MG capsule Commonly known as:  NEXIUM TAKE 1 CAPSULE DAILY  BEFORE BREAKFAST.   levothyroxine 25 MCG tablet Commonly known as:  SYNTHROID, LEVOTHROID Take 1 tablet (25 mcg total) by mouth daily before breakfast.   mometasone-formoterol 200-5 MCG/ACT Aero Commonly known as:  DULERA Inhale 2 puffs into the lungs 2 (two) times daily.   MUCINEX FAST-MAX DM MAX 5-100 MG/5ML Liqd Generic drug:  Dextromethorphan-guaiFENesin Take 15 mLs by mouth every 4 (four) hours as needed (chest congestion).   predniSONE 10 MG tablet Commonly known as:  DELTASONE Take 4 tablets for 3 days; Take 3 tablets for 4 days; Take 2 tablets for 3 days; Take 1 tablet for 4 days   sertraline 50 MG tablet Commonly known as:  ZOLOFT Take 1 tablet (50 mg total) by mouth daily.   SYSTANE OP Place 1 drop into both eyes 2 (two) times daily as needed (for dry eyes).   tiotropium 18 MCG inhalation capsule Commonly known as:  SPIRIVA HANDIHALER Place 1 capsule (18 mcg total) into inhaler and inhale daily.   traMADol 50 MG tablet Commonly known as:  ULTRAM Take 1 tablet (50 mg total) by mouth every 6 (six) hours as needed.  traZODone 50 MG tablet Commonly known as:  DESYREL TAKE 1/2 TO 1 TABLET AT BEDTIME AS NEEDED FOR REST.   zolpidem 5 MG tablet Commonly known as:  AMBIEN TAKE 1 TABLET AT BEDTIME AS NEEDED. What changed:    how much to take  how to take this  when to take this      Follow-up Information    [name], MD. Schedule an appointment as soon as possible for a visit in 1 week(s).   Specialty:  Internal Medicine Why:  Hospital follow-up Contact information: [address] [place] [address] [phone]          Allergies  Allergen Reactions  . Tylenol [Acetaminophen] Other (See Comments)    Nightmares  . Symbicort [Budesonide-Formoterol Fumarate] Other (See Comments)    Pt felt like her tongue was swollen, could not swallow  . Duloxetine Other (See Comments)    Patient doesn't recall  . Metronidazole Other (See  Comments)    Patient doesn't recall   . Rofecoxib Other (See Comments)    Patient doesn't recall     Consultations:  None   Procedures/Studies: Dg Chest 2 View  Result Date: 02/19/2018 CLINICAL DATA:  Shortness of Breath EXAM: CHEST - 2 VIEW COMPARISON:  None. FINDINGS: The heart size and mediastinal contours are within normal limits. Both lungs are clear. The visualized skeletal structures are unremarkable. IMPRESSION: No active cardiopulmonary disease. Electronically Signed   By: Inez Catalina M.D.   On: 02/19/2018 16:08   Dg Chest 2 View  Result Date: 01/28/2018 CLINICAL DATA:  Cough. EXAM: CHEST  2 VIEW COMPARISON:  Radiographs of May 28, 2016. FINDINGS: The heart size and mediastinal contours are within normal limits. Both lungs are clear. No pneumothorax or pleural effusion is noted. The visualized skeletal structures are unremarkable. IMPRESSION: No active cardiopulmonary disease. Electronically Signed   By: Marijo Conception, M.D.   On: 01/28/2018 15:44   Ct Angio Chest Pe W And/or Wo Contrast  Result Date: 01/28/2018 CLINICAL DATA:  Low oxygen saturations.  Cough x2 days and vomiting. EXAM: CT ANGIOGRAPHY CHEST WITH CONTRAST TECHNIQUE: Multidetector CT imaging of the chest was performed using the standard protocol during bolus administration of intravenous contrast. Multiplanar CT image reconstructions and MIPs were obtained to evaluate the vascular anatomy. CONTRAST:  136mL ISOVUE-370 IOPAMIDOL (ISOVUE-370) INJECTION 76% COMPARISON:  07/23/2015 CT FINDINGS: Cardiovascular: Satisfactory opacification of the pulmonary arteries to the segmental level. No evidence of pulmonary embolism. Normal heart size. No pericardial effusion. Coronary arteriosclerosis is noted along the LAD. No aortic aneurysm. There is aortic atherosclerosis. Mediastinum/Nodes: No enlarged mediastinal, hilar, or axillary lymph nodes. Thyroid gland, trachea, and esophagus demonstrate no significant findings.  Lungs/Pleura: Centrilobular emphysema of the lungs. No dominant mass or pneumonic consolidations. No effusion or pneumothorax. There is subsegmental atelectasis at the right lung base. Mild peribronchial thickening is seen to the right lower lobe where there is a 1 cm rounded opacity likely representing a small focus of mucous plugging. As a pulmonary nodule is not entirely excluded, short-term interval follow-up is recommended. Upper Abdomen: No acute abnormality. Musculoskeletal: No chest wall abnormality. No acute or significant osseous findings. Review of the MIP images confirms the above findings. IMPRESSION: 1. Centrilobular emphysema with chronic bronchitic change and peribronchial thickening noted. Probable area of inspissated mucus in the right lower lobe accounting for a nodular 1 cm opacity seen, new since prior 2016 CT. Consider one of the following in 3 months for both low-risk  and high-risk individuals: (a) repeat chest CT, (b) follow-up PET-CT, or (c) tissue sampling. This recommendation follows the consensus statement: Guidelines for Management of Incidental Pulmonary Nodules Detected on CT Images: From the Fleischner Society 2017; Radiology 2017; 284:228-243. 2. No acute pulmonary embolus. 3. Coronary arteriosclerosis and aortic atherosclerosis. No aneurysm or dissection of the aorta. Aortic Atherosclerosis (ICD10-I70.0) and Emphysema (ICD10-J43.9). Electronically Signed   By: Ashley Royalty M.D.   On: 01/28/2018 18:41   Dg Hip Unilat With Pelvis 2-3 Views Left  Result Date: 01/28/2018 CLINICAL DATA:  Left hip pain. EXAM: DG HIP (WITH OR WITHOUT PELVIS) 2-3V LEFT COMPARISON:  None. FINDINGS: There is no evidence of hip fracture or dislocation. There is no evidence of arthropathy or other focal bone abnormality. IMPRESSION: Normal left hip. Electronically Signed   By: Marijo Conception, M.D.   On: 01/28/2018 15:46     Discharge Exam: Vitals:   02/23/18 0505 02/23/18 0809  BP: 131/61   Pulse:  67   Resp: 19   Temp: 97.7 F (36.5 C)   SpO2: 92% 95%   Vitals:   02/22/18 1511 02/22/18 2002 02/23/18 0505 02/23/18 0809  BP: (!) 127/53 (!) 141/60 131/61   Pulse: 72 66 67   Resp: 16 18 19    Temp: 98.2 F (36.8 C) 98.5 F (36.9 C) 97.7 F (36.5 C)   TempSrc: Oral Oral Oral   SpO2: 92% 92% 92% 95%  Weight:      Height:        General: Pt is alert, awake, not in acute distress Cardiovascular: RRR, S1/S2 +, no rubs, no gallops Respiratory: Bronchial sounds with late expiratory wheezing. No crackles or rales  Abdominal: Soft, NT, ND, bowel sounds + Extremities: no edema.    The results of significant diagnostics from this hospitalization (including imaging, microbiology, ancillary and laboratory) are listed below for reference.     Microbiology: No results found for this or any previous visit (from the past 240 hour(s)).   Labs: BNP (last 3 results) Recent Labs    01/28/18 1422  BNP 633.3*   Basic Metabolic Panel: Recent Labs  Lab 02/19/18 1533 02/19/18 1909 02/20/18 0615 02/21/18 0602 02/22/18 0548  NA 137  --  139 138 139  K 2.7*  --  4.9 5.1 5.0  CL 96*  --  104 108 108  CO2 28  --  24 22 24   GLUCOSE 101*  --  170* 183* 138*  BUN 6  --  7 9 11   CREATININE 0.73  --  0.69 0.64 0.63  CALCIUM 9.0  --  8.5* 8.4* 8.5*  MG  --  1.7  --  2.7*  --    Liver Function Tests: Recent Labs  Lab 02/19/18 1533  AST 15  ALT 8*  ALKPHOS 82  BILITOT 0.6  PROT 7.3  ALBUMIN 3.7   No results for input(s): LIPASE, AMYLASE in the last 168 hours. No results for input(s): AMMONIA in the last 168 hours. CBC: Recent Labs  Lab 02/19/18 1533  WBC 8.8  NEUTROABS 6.3  HGB 11.7*  HCT 36.8  MCV 87.6  PLT 636*   Cardiac Enzymes: No results for input(s): CKTOTAL, CKMB, CKMBINDEX, TROPONINI in the last 168 hours. BNP: Invalid input(s): POCBNP CBG: No results for input(s): GLUCAP in the last 168 hours. D-Dimer No results for input(s): DDIMER in the last 72  hours. Hgb A1c No results for input(s): HGBA1C in the last 72 hours. Lipid Profile No results for  input(s): CHOL, HDL, LDLCALC, TRIG, CHOLHDL, LDLDIRECT in the last 72 hours. Thyroid function studies No results for input(s): TSH, T4TOTAL, T3FREE, THYROIDAB in the last 72 hours.  Invalid input(s): FREET3 Anemia work up No results for input(s): VITAMINB12, FOLATE, FERRITIN, TIBC, IRON, RETICCTPCT in the last 72 hours. Urinalysis    Component Value Date/Time   COLORURINE AMBER (A) 08/22/2014 1105   APPEARANCEUR CLEAR 08/22/2014 1105   LABSPEC 1.011 08/22/2014 1105   PHURINE 6.0 08/22/2014 1105   GLUCOSEU NEGATIVE 08/22/2014 1105   HGBUR NEGATIVE 08/22/2014 1105   HGBUR trace-lysed 11/15/2010 1046   BILIRUBINUR NEGATIVE 08/22/2014 1105   KETONESUR NEGATIVE 08/22/2014 1105   PROTEINUR NEGATIVE 08/22/2014 1105   UROBILINOGEN 0.2 08/22/2014 1105   NITRITE NEGATIVE 08/22/2014 1105   LEUKOCYTESUR NEGATIVE 08/22/2014 1105   Sepsis Labs Invalid input(s): PROCALCITONIN,  WBC,  LACTICIDVEN Microbiology No results found for this or any previous visit (from the past 240 hour(s)).   Time coordinating discharge: 32 minutes  SIGNED:  Chipper Oman, MD  [facility] 02/23/2018, 11:07 AM  Pager please text page via  [email or web address]  Note - This record has been created using [employer]. Chart creation errors have been sought, but may not always have been located. Such creation errors do not reflect on the standard of medical care.

## 2018-02-24 ENCOUNTER — Telehealth: Payer: Self-pay | Admitting: Family Medicine

## 2018-02-24 ENCOUNTER — Other Ambulatory Visit: Payer: Self-pay | Admitting: *Deleted

## 2018-02-24 ENCOUNTER — Telehealth: Payer: Self-pay | Admitting: Internal Medicine

## 2018-02-24 NOTE — Telephone Encounter (Signed)
Noted! Routing to Dr.Kwiatkowski. 

## 2018-02-24 NOTE — Patient Outreach (Signed)
[facility]) Care Management  02/24/2018  Heather [name] Sep 20, 1935 [id number]    Referral received today with a d/c 3/19  Provider's office will complete transition of care. Therefore RN outreach for services related to the referral COPD however call was unsuccessful and RN left a HIPAA approved voice message requesting a call back. Will attempt another outreach call tomorrow for pending [facility] services.  [name], RN Care Management Coordinator [place] Office [phone]

## 2018-02-24 NOTE — Telephone Encounter (Signed)
Copied from [place] [phone]. Topic: Quick Communication - See Telephone Encounter >> Feb 24, 2018  9:03 AM Cleaster Corin, NT wrote: CRM for notification. See Telephone encounter for:   02/24/18. Christina calling from [facility] to let Dr. Burnice Logan know that pt. Wanted to start home health tomorrow 02/25/18 instead of today 02/24/18. Margreta Journey can be reached at [phone]

## 2018-02-24 NOTE — Telephone Encounter (Signed)
Transition Care Management Follow-up Telephone Call  Physician Discharge Summary  Heather [name]  SFS:[id number]  DOB: 1935-04-09  DOA: 02/19/2018 PCP: [name], MD  Admit date: 02/19/2018 Discharge date: 02/23/2018  Admitted From: Home Disposition: Home  Recommendations for Outpatient Follow-up:  1. Follow up with PCP in 1 week 2. Please obtain BMP/CBC in one week to monitor hemoglobin and renal function 3. May need PFTs as an outpatient.  Home Health: PT/respiratory  Discharge Condition: Stable CODE STATUS: Full code Diet recommendation: Heart Healthy  Brief/Interim Summary: For full details see H&P/Progress note, but in brief, Heather [name] is a 83 year old female with past medical history of COPD, hypertension, hypothyroidism, depression/anxiety who presented to the emergency department after having dyspnea and hypoxia at outpatient clinic.  Upon initial evaluation chest x-ray showed signs of hyperinflation, d-dimer was negative.  Patient was found to have bilateral wheezing with decreasing air entry and patient was admitted with acute hypoxic respiratory failure due to COPD exacerbation.  She was treated with IV steroids, nebulizer treatment and azithromycin, subsequently oxygen was weaned off and she was saturating well while on room air.  Patient significantly improved and was deemed stable for discharge and follow-up with primary care physician.    How have you been since you were released from the hospital? "weak"   Do you understand why you were in the hospital? yes   Do you understand the discharge instructions? yes   Where were you discharged to? Home    Items Reviewed:  Medications reviewed: yes  Allergies reviewed: yes  Dietary changes reviewed: yes  Referrals reviewed: yes   Functional Questionnaire:   Activities of Daily Living (ADLs):   She states they are independent in the following: ambulation, feeding, continence,  grooming, toileting and dressing States they require assistance with the following: bathing and hygiene   Any transportation issues/concerns?: no   Any patient concerns? no   Confirmed importance and date/time of follow-up visits scheduled no, son will call back on Monday to schedule visit  Provider Appointment booked with  Confirmed with patient if condition begins to worsen call PCP or go to the ER.  Patient was given the office number and encouraged to call back with question or concerns.  : yes

## 2018-02-25 ENCOUNTER — Telehealth: Payer: Self-pay | Admitting: Internal Medicine

## 2018-02-25 ENCOUNTER — Other Ambulatory Visit: Payer: Self-pay | Admitting: *Deleted

## 2018-02-25 ENCOUNTER — Telehealth: Payer: Self-pay | Admitting: Nurse Practitioner

## 2018-02-25 NOTE — Telephone Encounter (Signed)
Okay for transfer

## 2018-02-25 NOTE — Patient Outreach (Signed)
[facility]) Care Management  02/25/2018  [name] 08/09/1935 [id number]    RN attempted the 2nd outreach however unsuccessful but able to leave a HIPAA approved voice message requesting a call back. Will send outreach letter and allow pt to return a call and follow up in 10 days.  [name], RN Care Management Coordinator [place] Office [phone]

## 2018-02-25 NOTE — Telephone Encounter (Signed)
Okay for verbal orders. 

## 2018-02-25 NOTE — Telephone Encounter (Signed)
Okay for Verbal orders? Please advise

## 2018-02-25 NOTE — Telephone Encounter (Signed)
Okay with me 

## 2018-02-25 NOTE — Telephone Encounter (Signed)
Called Marlowe Kays and left detailed message giving okay orders per Dr.Kwiatkowski.

## 2018-02-25 NOTE — Telephone Encounter (Signed)
Copied from [place] [phone]. Topic: Quick Communication - See Telephone Encounter >> Feb 25, 2018  9:09 AM Corie Chiquito, NT wrote: CRM for notification. Patient's son is calling because he would like to change his mother's pcp. Stated that he is closer to the [place] office and would like his mother to be a patient in that office with [facility] since Dr. Burnice Logan will be retiring soon. If someone could give him a call back about this at [phone]

## 2018-02-25 NOTE — Telephone Encounter (Signed)
Copied from [place]. Topic: Quick Communication - See Telephone Encounter >> Feb 25, 2018 11:28 AM Cleaster Corin, NT wrote: CRM for notification. See Telephone encounter for: 02/25/18.  Marlowe Kays calling from [facility] to get verbal orders for skilled nursing for 6 weeks and a PT aide Marlowe Kays can be reached at [phone]

## 2018-02-25 NOTE — Telephone Encounter (Signed)
Request for transfer of care from Dr Inda Merlin at [place] to [facility] at [place].  Dr Inda Merlin, is this okay with you? Ashleigh, is this okay with you?

## 2018-02-26 NOTE — Telephone Encounter (Signed)
Appointment scheduled with Ashleigh. Advised to continue care as needed until appointment with Ashleigh in May (first available).

## 2018-02-27 ENCOUNTER — Inpatient Hospital Stay (HOSPITAL_COMMUNITY)
Admission: EM | Admit: 2018-02-27 | Discharge: 2018-03-13 | DRG: 394 | Disposition: A | Discharge status: Home Health | Payer: Medicare Other | Attending: Family Medicine | Admitting: Family Medicine

## 2018-02-27 ENCOUNTER — Emergency Department (HOSPITAL_COMMUNITY): Payer: Medicare Other

## 2018-02-27 ENCOUNTER — Other Ambulatory Visit: Payer: Self-pay

## 2018-02-27 ENCOUNTER — Encounter (HOSPITAL_COMMUNITY): Payer: Self-pay | Admitting: Emergency Medicine

## 2018-02-27 DIAGNOSIS — F329 Major depressive disorder, single episode, unspecified: Secondary | ICD-10-CM | POA: Diagnosis present

## 2018-02-27 DIAGNOSIS — R188 Other ascites: Secondary | ICD-10-CM | POA: Diagnosis not present

## 2018-02-27 DIAGNOSIS — K56609 Unspecified intestinal obstruction, unspecified as to partial versus complete obstruction: Secondary | ICD-10-CM | POA: Diagnosis not present

## 2018-02-27 DIAGNOSIS — K5909 Other constipation: Secondary | ICD-10-CM | POA: Diagnosis present

## 2018-02-27 DIAGNOSIS — K529 Noninfective gastroenteritis and colitis, unspecified: Secondary | ICD-10-CM | POA: Diagnosis not present

## 2018-02-27 DIAGNOSIS — R112 Nausea with vomiting, unspecified: Secondary | ICD-10-CM | POA: Diagnosis not present

## 2018-02-27 DIAGNOSIS — Z4682 Encounter for fitting and adjustment of non-vascular catheter: Secondary | ICD-10-CM | POA: Diagnosis not present

## 2018-02-27 DIAGNOSIS — J90 Pleural effusion, not elsewhere classified: Secondary | ICD-10-CM | POA: Diagnosis present

## 2018-02-27 DIAGNOSIS — K6389 Other specified diseases of intestine: Secondary | ICD-10-CM | POA: Diagnosis not present

## 2018-02-27 DIAGNOSIS — G4700 Insomnia, unspecified: Secondary | ICD-10-CM | POA: Diagnosis present

## 2018-02-27 DIAGNOSIS — R933 Abnormal findings on diagnostic imaging of other parts of digestive tract: Secondary | ICD-10-CM | POA: Diagnosis not present

## 2018-02-27 DIAGNOSIS — K219 Gastro-esophageal reflux disease without esophagitis: Secondary | ICD-10-CM | POA: Diagnosis present

## 2018-02-27 DIAGNOSIS — Z90710 Acquired absence of both cervix and uterus: Secondary | ICD-10-CM | POA: Diagnosis not present

## 2018-02-27 DIAGNOSIS — Z79890 Hormone replacement therapy (postmenopausal): Secondary | ICD-10-CM | POA: Diagnosis not present

## 2018-02-27 DIAGNOSIS — J449 Chronic obstructive pulmonary disease, unspecified: Secondary | ICD-10-CM | POA: Diagnosis present

## 2018-02-27 DIAGNOSIS — E440 Moderate protein-calorie malnutrition: Secondary | ICD-10-CM | POA: Diagnosis present

## 2018-02-27 DIAGNOSIS — I2510 Atherosclerotic heart disease of native coronary artery without angina pectoris: Secondary | ICD-10-CM | POA: Diagnosis present

## 2018-02-27 DIAGNOSIS — R339 Retention of urine, unspecified: Secondary | ICD-10-CM | POA: Diagnosis present

## 2018-02-27 DIAGNOSIS — D638 Anemia in other chronic diseases classified elsewhere: Secondary | ICD-10-CM | POA: Diagnosis present

## 2018-02-27 DIAGNOSIS — F419 Anxiety disorder, unspecified: Secondary | ICD-10-CM | POA: Diagnosis present

## 2018-02-27 DIAGNOSIS — R109 Unspecified abdominal pain: Secondary | ICD-10-CM

## 2018-02-27 DIAGNOSIS — R140 Abdominal distension (gaseous): Secondary | ICD-10-CM | POA: Diagnosis not present

## 2018-02-27 DIAGNOSIS — Z6821 Body mass index (BMI) 21.0-21.9, adult: Secondary | ICD-10-CM | POA: Diagnosis not present

## 2018-02-27 DIAGNOSIS — K558 Other vascular disorders of intestine: Secondary | ICD-10-CM | POA: Diagnosis present

## 2018-02-27 DIAGNOSIS — R197 Diarrhea, unspecified: Secondary | ICD-10-CM | POA: Diagnosis not present

## 2018-02-27 DIAGNOSIS — R1084 Generalized abdominal pain: Secondary | ICD-10-CM | POA: Diagnosis not present

## 2018-02-27 DIAGNOSIS — J438 Other emphysema: Secondary | ICD-10-CM | POA: Diagnosis present

## 2018-02-27 DIAGNOSIS — R1013 Epigastric pain: Secondary | ICD-10-CM | POA: Diagnosis not present

## 2018-02-27 DIAGNOSIS — N179 Acute kidney failure, unspecified: Secondary | ICD-10-CM | POA: Diagnosis present

## 2018-02-27 DIAGNOSIS — R1110 Vomiting, unspecified: Secondary | ICD-10-CM

## 2018-02-27 DIAGNOSIS — I10 Essential (primary) hypertension: Secondary | ICD-10-CM | POA: Diagnosis present

## 2018-02-27 DIAGNOSIS — F32A Depression, unspecified: Secondary | ICD-10-CM | POA: Diagnosis present

## 2018-02-27 DIAGNOSIS — E860 Dehydration: Secondary | ICD-10-CM | POA: Diagnosis not present

## 2018-02-27 DIAGNOSIS — E785 Hyperlipidemia, unspecified: Secondary | ICD-10-CM | POA: Diagnosis present

## 2018-02-27 DIAGNOSIS — R1032 Left lower quadrant pain: Secondary | ICD-10-CM | POA: Diagnosis not present

## 2018-02-27 DIAGNOSIS — K835 Biliary cyst: Secondary | ICD-10-CM | POA: Diagnosis not present

## 2018-02-27 DIAGNOSIS — E876 Hypokalemia: Secondary | ICD-10-CM | POA: Diagnosis present

## 2018-02-27 DIAGNOSIS — E039 Hypothyroidism, unspecified: Secondary | ICD-10-CM | POA: Diagnosis not present

## 2018-02-27 DIAGNOSIS — J439 Emphysema, unspecified: Secondary | ICD-10-CM | POA: Diagnosis present

## 2018-02-27 LAB — COMPREHENSIVE METABOLIC PANEL
ALT: 16 U/L (ref 14–54)
AST: 27 U/L (ref 15–41)
Albumin: 2.9 g/dL — ABNORMAL LOW (ref 3.5–5.0)
Alkaline Phosphatase: 101 U/L (ref 38–126)
Anion gap: 15 (ref 5–15)
BUN: 35 mg/dL — ABNORMAL HIGH (ref 6–20)
CO2: 27 mmol/L (ref 22–32)
Calcium: 8.1 mg/dL — ABNORMAL LOW (ref 8.9–10.3)
Chloride: 98 mmol/L — ABNORMAL LOW (ref 101–111)
Creatinine, Ser: 1.16 mg/dL — ABNORMAL HIGH (ref 0.44–1.00)
GFR calc Af Amer: 50 mL/min — ABNORMAL LOW (ref >60)
GFR calc non Af Amer: 43 mL/min — ABNORMAL LOW (ref >60)
Glucose, Bld: 123 mg/dL — ABNORMAL HIGH (ref 65–99)
Potassium: 3.1 mmol/L — ABNORMAL LOW (ref 3.5–5.1)
Sodium: 140 mmol/L (ref 135–145)
Total Bilirubin: 0.3 mg/dL (ref 0.3–1.2)
Total Protein: 5.7 g/dL — ABNORMAL LOW (ref 6.5–8.1)

## 2018-02-27 LAB — URINALYSIS, ROUTINE W REFLEX MICROSCOPIC
Bilirubin Urine: NEGATIVE
Glucose, UA: NEGATIVE mg/dL
Hgb urine dipstick: NEGATIVE
Ketones, ur: NEGATIVE mg/dL
Leukocytes, UA: NEGATIVE
Nitrite: NEGATIVE
Protein, ur: NEGATIVE mg/dL
Specific Gravity, Urine: 1.015 (ref 1.005–1.030)
pH: 5 (ref 5.0–8.0)

## 2018-02-27 LAB — CBC
HCT: 42.1 % (ref 36.0–46.0)
Hemoglobin: 13.7 g/dL (ref 12.0–15.0)
MCH: 27.6 pg (ref 26.0–34.0)
MCHC: 32.5 g/dL (ref 30.0–36.0)
MCV: 84.7 fL (ref 78.0–100.0)
Platelets: 575 10*3/uL — ABNORMAL HIGH (ref 150–400)
RBC: 4.97 MIL/uL (ref 3.87–5.11)
RDW: 16.2 % — ABNORMAL HIGH (ref 11.5–15.5)
WBC: 36 10*3/uL — ABNORMAL HIGH (ref 4.0–10.5)

## 2018-02-27 LAB — CBC WITH DIFFERENTIAL/PLATELET
Basophils Absolute: 0 10*3/uL (ref 0.0–0.1)
Basophils Relative: 0 %
Eosinophils Absolute: 0 10*3/uL (ref 0.0–0.7)
Eosinophils Relative: 0 %
HCT: 45.8 % (ref 36.0–46.0)
Hemoglobin: 14.6 g/dL (ref 12.0–15.0)
Lymphocytes Relative: 5 %
Lymphs Abs: 1.7 10*3/uL (ref 0.7–4.0)
MCH: 27.8 pg (ref 26.0–34.0)
MCHC: 31.9 g/dL (ref 30.0–36.0)
MCV: 87.2 fL (ref 78.0–100.0)
Monocytes Absolute: 4 10*3/uL — ABNORMAL HIGH (ref 0.1–1.0)
Monocytes Relative: 12 %
Neutro Abs: 27.3 10*3/uL — ABNORMAL HIGH (ref 1.7–7.7)
Neutrophils Relative %: 83 %
Platelets: 773 10*3/uL — ABNORMAL HIGH (ref 150–400)
RBC: 5.25 MIL/uL — ABNORMAL HIGH (ref 3.87–5.11)
RDW: 16.1 % — ABNORMAL HIGH (ref 11.5–15.5)
WBC: 33 10*3/uL — ABNORMAL HIGH (ref 4.0–10.5)

## 2018-02-27 LAB — CREATININE, SERUM
Creatinine, Ser: 1.05 mg/dL — ABNORMAL HIGH (ref 0.44–1.00)
GFR calc Af Amer: 57 mL/min — ABNORMAL LOW (ref >60)
GFR calc non Af Amer: 49 mL/min — ABNORMAL LOW (ref >60)

## 2018-02-27 LAB — LIPASE, BLOOD: Lipase: 25 U/L (ref 11–51)

## 2018-02-27 MED ORDER — POLYVINYL ALCOHOL 1.4 % OP SOLN: 1.0000 [drp] | Freq: Two times a day (BID) | OPHTHALMIC | Status: DC | PRN

## 2018-02-27 MED ORDER — MOMETASONE FURO-FORMOTEROL FUM 200-5 MCG/ACT IN AERO
2.0000 | INHALATION_SPRAY | Freq: Two times a day (BID) | RESPIRATORY_TRACT | Status: DC
  Administered 2018-02-28 – 2018-03-13 (×25): 2 via RESPIRATORY_TRACT
  Filled 2018-02-27: qty 8.8

## 2018-02-27 MED ORDER — POTASSIUM CHLORIDE IN NACL 40-0.9 MEQ/L-% IV SOLN
INTRAVENOUS | Status: AC
  Administered 2018-02-27: 20:00:00 75 mL/h via INTRAVENOUS
  Filled 2018-02-27 (×2): qty 1000

## 2018-02-27 MED ORDER — POTASSIUM CHLORIDE 10 MEQ/100ML IV SOLN
10.0000 meq | INTRAVENOUS | Status: AC
  Administered 2018-02-27 – 2018-02-28 (×3): 10 meq via INTRAVENOUS
  Filled 2018-02-27 (×3): qty 100

## 2018-02-27 MED ORDER — VITAMIN D3 25 MCG (1000 UNIT) PO TABS
2000.0000 [IU] | ORAL_TABLET | Freq: Every day | ORAL | Status: DC
  Administered 2018-02-27 – 2018-03-13 (×10): 2000 [IU] via ORAL
  Filled 2018-02-27 (×11): qty 2

## 2018-02-27 MED ORDER — PIPERACILLIN-TAZOBACTAM 3.375 G IVPB
3.3750 g | Freq: Three times a day (TID) | INTRAVENOUS | Status: DC
  Administered 2018-02-28 – 2018-03-04 (×14): 3.375 g via INTRAVENOUS
  Filled 2018-02-27 (×14): qty 50

## 2018-02-27 MED ORDER — PREDNISONE 20 MG PO TABS: 30.0000 mg | ORAL_TABLET | Freq: Every day | ORAL | Status: DC

## 2018-02-27 MED ORDER — IPRATROPIUM-ALBUTEROL 0.5-2.5 (3) MG/3ML IN SOLN
3.0000 mL | Freq: Four times a day (QID) | RESPIRATORY_TRACT | Status: DC
  Administered 2018-02-27: 3 mL via RESPIRATORY_TRACT
  Filled 2018-02-27: qty 3

## 2018-02-27 MED ORDER — SERTRALINE HCL 50 MG PO TABS
50.0000 mg | ORAL_TABLET | Freq: Every day | ORAL | Status: DC
  Administered 2018-02-27 – 2018-03-13 (×11): 50 mg via ORAL
  Filled 2018-02-27 (×14): qty 1

## 2018-02-27 MED ORDER — TRAZODONE HCL 50 MG PO TABS
50.0000 mg | ORAL_TABLET | Freq: Every evening | ORAL | Status: DC | PRN
  Administered 2018-02-27 – 2018-03-03 (×4): 50 mg via ORAL
  Filled 2018-02-27 (×4): qty 1

## 2018-02-27 MED ORDER — BENZONATATE 100 MG PO CAPS
100.0000 mg | ORAL_CAPSULE | Freq: Two times a day (BID) | ORAL | Status: DC | PRN
  Administered 2018-03-07: 100 mg via ORAL
  Filled 2018-02-27: qty 1

## 2018-02-27 MED ORDER — ONDANSETRON HCL 4 MG PO TABS
4.0000 mg | ORAL_TABLET | Freq: Four times a day (QID) | ORAL | Status: DC | PRN
  Administered 2018-02-28 – 2018-03-12 (×12): 4 mg via ORAL
  Filled 2018-02-27 (×12): qty 1

## 2018-02-27 MED ORDER — PREDNISONE 5 MG PO TABS
30.0000 mg | ORAL_TABLET | Freq: Every day | ORAL | Status: AC
  Administered 2018-02-28 – 2018-03-02 (×3): 30 mg via ORAL
  Filled 2018-02-27: qty 2
  Filled 2018-02-27 (×2): qty 1

## 2018-02-27 MED ORDER — BISACODYL 5 MG PO TBEC
5.0000 mg | DELAYED_RELEASE_TABLET | Freq: Every day | ORAL | Status: DC | PRN
  Administered 2018-03-01: 5 mg via ORAL
  Filled 2018-02-27: qty 1

## 2018-02-27 MED ORDER — PANTOPRAZOLE SODIUM 40 MG PO TBEC
40.0000 mg | DELAYED_RELEASE_TABLET | Freq: Every day | ORAL | Status: DC
  Administered 2018-02-28 – 2018-03-05 (×5): 40 mg via ORAL
  Filled 2018-02-27 (×7): qty 1

## 2018-02-27 MED ORDER — SODIUM CHLORIDE 0.9 % IV SOLN
INTRAVENOUS | Status: DC
  Administered 2018-02-27: 12:00:00 via INTRAVENOUS

## 2018-02-27 MED ORDER — ONDANSETRON HCL 4 MG/2ML IJ SOLN
4.0000 mg | Freq: Four times a day (QID) | INTRAMUSCULAR | Status: DC | PRN
  Administered 2018-02-28 – 2018-03-13 (×19): 4 mg via INTRAVENOUS
  Filled 2018-02-27 (×19): qty 2

## 2018-02-27 MED ORDER — SENNOSIDES-DOCUSATE SODIUM 8.6-50 MG PO TABS
1.0000 | ORAL_TABLET | Freq: Every evening | ORAL | Status: DC | PRN
  Administered 2018-03-01 – 2018-03-11 (×2): 1 via ORAL
  Filled 2018-02-27 (×3): qty 1

## 2018-02-27 MED ORDER — ALBUTEROL SULFATE (2.5 MG/3ML) 0.083% IN NEBU
2.5000 mg | INHALATION_SOLUTION | Freq: Four times a day (QID) | RESPIRATORY_TRACT | Status: DC
  Administered 2018-02-28: 08:00:00 2.5 mg via RESPIRATORY_TRACT
  Filled 2018-02-27: qty 3

## 2018-02-27 MED ORDER — IOPAMIDOL (ISOVUE-300) INJECTION 61%
INTRAVENOUS | Status: AC
  Administered 2018-02-27: 75 mL
  Filled 2018-02-27: qty 75

## 2018-02-27 MED ORDER — DILTIAZEM HCL ER COATED BEADS 240 MG PO CP24
240.0000 mg | ORAL_CAPSULE | Freq: Every day | ORAL | Status: DC
  Administered 2018-02-28 – 2018-03-01 (×2): 240 mg via ORAL
  Filled 2018-02-27 (×2): qty 1

## 2018-02-27 MED ORDER — ONDANSETRON HCL 4 MG/2ML IJ SOLN
4.0000 mg | Freq: Once | INTRAMUSCULAR | Status: AC
  Administered 2018-02-27: 4 mg via INTRAVENOUS
  Filled 2018-02-27: qty 2

## 2018-02-27 MED ORDER — CIPROFLOXACIN IN D5W 400 MG/200ML IV SOLN: 400.0000 mg | Freq: Once | INTRAVENOUS | Status: DC

## 2018-02-27 MED ORDER — PREDNISONE 5 MG PO TABS: 10.0000 mg | ORAL_TABLET | Freq: Every day | ORAL | Status: DC

## 2018-02-27 MED ORDER — PREDNISONE 20 MG PO TABS
20.0000 mg | ORAL_TABLET | Freq: Every day | ORAL | Status: AC
  Administered 2018-03-03 – 2018-03-05 (×2): 20 mg via ORAL
  Filled 2018-02-27 (×3): qty 1

## 2018-02-27 MED ORDER — SODIUM CHLORIDE 0.9 % IV BOLUS (SEPSIS)
500.0000 mL | Freq: Once | INTRAVENOUS | Status: AC
  Administered 2018-02-27: 500 mL via INTRAVENOUS

## 2018-02-27 MED ORDER — SODIUM CHLORIDE 0.9 % IJ SOLN
INTRAMUSCULAR | Status: AC
  Filled 2018-02-27: qty 50

## 2018-02-27 MED ORDER — MORPHINE SULFATE (PF) 2 MG/ML IV SOLN
2.0000 mg | INTRAVENOUS | Status: AC | PRN
  Administered 2018-02-27 – 2018-02-28 (×3): 2 mg via INTRAVENOUS
  Filled 2018-02-27 (×3): qty 1

## 2018-02-27 MED ORDER — METRONIDAZOLE IN NACL 5-0.79 MG/ML-% IV SOLN: 500.0000 mg | Freq: Once | INTRAVENOUS | Status: DC

## 2018-02-27 MED ORDER — HEPARIN SODIUM (PORCINE) 5000 UNIT/ML IJ SOLN
5000.0000 [IU] | Freq: Three times a day (TID) | INTRAMUSCULAR | Status: DC
  Administered 2018-02-27 – 2018-03-13 (×41): 5000 [IU] via SUBCUTANEOUS
  Filled 2018-02-27 (×42): qty 1

## 2018-02-27 MED ORDER — PIPERACILLIN-TAZOBACTAM 3.375 G IVPB 30 MIN
3.3750 g | INTRAVENOUS | Status: AC
  Filled 2018-02-27: qty 50

## 2018-02-27 MED ORDER — TRAMADOL HCL 50 MG PO TABS
50.0000 mg | ORAL_TABLET | Freq: Two times a day (BID) | ORAL | Status: DC | PRN
  Administered 2018-02-27 – 2018-03-01 (×4): 50 mg via ORAL
  Filled 2018-02-27 (×4): qty 1

## 2018-02-27 MED ORDER — FENTANYL CITRATE (PF) 100 MCG/2ML IJ SOLN
25.0000 ug | Freq: Once | INTRAMUSCULAR | Status: AC
  Administered 2018-02-27: 25 ug via INTRAVENOUS
  Filled 2018-02-27: qty 2

## 2018-02-27 MED ORDER — LEVOTHYROXINE SODIUM 25 MCG PO TABS
25.0000 ug | ORAL_TABLET | Freq: Every day | ORAL | Status: DC
  Administered 2018-02-28 – 2018-03-05 (×5): 25 ug via ORAL
  Filled 2018-02-27 (×6): qty 1

## 2018-02-27 NOTE — ED Notes (Signed)
Called report to [facility].

## 2018-02-27 NOTE — ED Notes (Signed)
Bed: WA02 Expected date: 02/27/18 Expected time: 9:38 AM Means of arrival: Ambulance Comments: Elderly female-constipated

## 2018-02-27 NOTE — Progress Notes (Addendum)
Pharmacy Antibiotic Note  Heather [name] is a 83 y.o. female admitted on 02/27/2018 with intra-abdominal infection.  Pharmacy has been consulted for Zosyn dosing.  Plan: Zosyn 3.375g IV x 1 over 30 minutes, then Zosyn 3.375g IV q8h (each dose infused over 4 hours). Monitor renal function, culture results as available, clinical course.   Height: 4\' 11"  (149.9 cm) Weight: 104 lb (47.2 kg) IBW/kg (Calculated) : 43.2  Temp (24hrs), Avg:98.1 F (36.7 C), Min:98.1 F (36.7 C), Max:98.1 F (36.7 C)  Recent Labs  Lab 02/21/18 0602 02/22/18 0548 02/27/18 1108  WBC  --   --  33.0*  CREATININE 0.64 0.63 1.16*    Estimated Creatinine Clearance: 26.4 mL/min (A) (by C-G formula based on SCr of 1.16 mg/dL (H)).    Allergies  Allergen Reactions  . Tylenol [Acetaminophen] Other (See Comments)    Nightmares  . Symbicort [Budesonide-Formoterol Fumarate] Other (See Comments)    Pt felt like her tongue was swollen, could not swallow  . Duloxetine Other (See Comments)    Patient doesn't recall  . Metronidazole Other (See Comments)    Patient doesn't recall   . Rofecoxib Other (See Comments)    Patient doesn't recall     Antimicrobials this admission: 3/23 >> Zosyn >>  Dose adjustments this admission: --  Microbiology results: None ordered  Thank you for allowing pharmacy to be a part of this patient's care.   Lindell Spar, PharmD, BCPS Pager: [phone] 02/27/2018 5:54 PM

## 2018-02-27 NOTE — ED Notes (Signed)
Pt is alert and orinted x 3 pt is c/o generalized abdominal pain pt reports nausea  And vomiting x 2 in the past 24 hours with yellow emesis. Pt reports 8/10 cramping. Pt has occasional moan. Pt denies diarrhea.

## 2018-02-27 NOTE — ED Notes (Signed)
ED TO INPATIENT HANDOFF REPORT  Name/Age/Gender Heather [name] 83 y.o. female  Code Status    Code Status Orders  (From admission, onward)        Start     Ordered   02/27/18 1704  Full code  Continuous     02/27/18 1706    Code Status History    Date Active Date Inactive Code Status Order ID Comments User Context   02/19/2018 1816 02/23/2018 1901 Full Code [id number]  Debbe Odea, MD ED   01/28/2018 2225 02/01/2018 1903 Full Code [id number]  Karmen Bongo, MD Inpatient   05/29/2016 0137 06/01/2016 1903 Full Code [id number]  Karmen Bongo, MD Inpatient   07/23/2015 1732 07/26/2015 1802 Full Code [id number]  Rama, [name], MD Inpatient   08/13/2014 2011 08/16/2014 2004 Full Code [id number]  Elgergawy, [name], MD Inpatient      Home/SNF/Other    Chief Complaint Abdominal Pain  Level of Care/Admitting Diagnosis ED Disposition    ED Disposition Condition [facility]: [facility] [[id number]]  Level of Care: Med-Surg [16]  Diagnosis: Colitis [562563]  Admitting Physician: [name] [facility] [[id number]]  Attending Physician: [name] [facility] [[id number]]  Estimated length of stay: 3 - 4 days  Certification:: I certify this patient will need inpatient services for at least 2 midnights  PT Class (Do Not Modify): Inpatient [101]  PT Acc Code (Do Not Modify): Private [1]       Medical History Past Medical History:  Diagnosis Date  . ANXIETY 07/23/2009  . Atherosclerosis of aorta ([place]) 07/24/2015  . Complication of anesthesia 7 or 8 yrs ago   woke up during colonscopy  . Coronary atherosclerosis 07/24/2015  . DEPRESSION 07/23/2009  . DIVERTICULOSIS, COLON 07/23/2009  . GERD 07/23/2009  . Headache(784.0) 07/23/2009   occasional  . Hemorrhoids   . HIP PAIN, LEFT 05/16/2010  . History of Crohn's disease   . HYPERLIPIDEMIA 07/23/2009  . HYPERTENSION 07/23/2009  . HYPOTHYROIDISM 07/23/2009  . IBS (irritable bowel syndrome)   . Insomnia   .  LOW BACK PAIN, CHRONIC 10/01/2009  . PARESTHESIA 10/01/2009  . Shingles 2006   back    Allergies Allergies  Allergen Reactions  . Tylenol [Acetaminophen] Other (See Comments)    Nightmares  . Symbicort [Budesonide-Formoterol Fumarate] Other (See Comments)    Pt felt like her tongue was swollen, could not swallow  . Duloxetine Other (See Comments)    Patient doesn't recall  . Metronidazole Other (See Comments)    Patient doesn't recall   . Rofecoxib Other (See Comments)    Patient doesn't recall     IV Location/Drains/Wounds Patient Lines/Drains/Airways Status   Active Line/Drains/Airways    Name:   Placement date:   Placement time:   Site:   Days:   Peripheral IV 02/27/18 Right Antecubital   02/27/18    1159    Antecubital   less than 1          Labs/Imaging Results for orders placed or performed during the hospital encounter of 02/27/18 (from the past 48 hour(s))  Comprehensive metabolic panel     Status: Abnormal   Collection Time: 02/27/18 11:08 AM  Result Value Ref Range   Sodium 140 135 - 145 mmol/L   Potassium 3.1 (L) 3.5 - 5.1 mmol/L   Chloride 98 (L) 101 - 111 mmol/L   CO2 27 22 - 32 mmol/L   Glucose, Bld 123 (H) 65 - 99 mg/dL  BUN 35 (H) 6 - 20 mg/dL   Creatinine, Ser 1.16 (H) 0.44 - 1.00 mg/dL   Calcium 8.1 (L) 8.9 - 10.3 mg/dL   Total Protein 5.7 (L) 6.5 - 8.1 g/dL   Albumin 2.9 (L) 3.5 - 5.0 g/dL   AST 27 15 - 41 U/L   ALT 16 14 - 54 U/L    Comment: RESULTS CONFIRMED BY MANUAL DILUTION   Alkaline Phosphatase 101 38 - 126 U/L   Total Bilirubin 0.3 0.3 - 1.2 mg/dL   GFR calc non Af Amer 43 (L) >60 mL/min   GFR calc Af Amer 50 (L) >60 mL/min    Comment: (NOTE) The eGFR has been calculated using the CKD EPI equation. This calculation has not been validated in all clinical situations. eGFR's persistently <60 mL/min signify possible Chronic Kidney Disease.    Anion gap 15 5 - 15    Comment: Performed at [facility], [place]  [address], [place], [place] [address]  CBC with Differential     Status: Abnormal   Collection Time: 02/27/18 11:08 AM  Result Value Ref Range   WBC 33.0 (H) 4.0 - 10.5 K/uL   RBC 5.25 (H) 3.87 - 5.11 MIL/uL   Hemoglobin 14.6 12.0 - 15.0 g/dL   HCT 45.8 36.0 - 46.0 %   MCV 87.2 78.0 - 100.0 fL   MCH 27.8 26.0 - 34.0 pg   MCHC 31.9 30.0 - 36.0 g/dL   RDW 16.1 (H) 11.5 - 15.5 %   Platelets 773 (H) 150 - 400 K/uL   Neutrophils Relative % 83 %   Lymphocytes Relative 5 %   Monocytes Relative 12 %   Eosinophils Relative 0 %   Basophils Relative 0 %   Neutro Abs 27.3 (H) 1.7 - 7.7 K/uL   Lymphs Abs 1.7 0.7 - 4.0 K/uL   Monocytes Absolute 4.0 (H) 0.1 - 1.0 K/uL   Eosinophils Absolute 0.0 0.0 - 0.7 K/uL   Basophils Absolute 0.0 0.0 - 0.1 K/uL   RBC Morphology POLYCHROMASIA PRESENT    WBC Morphology WHITE COUNT CONFIRMED ON SMEAR     Comment: Performed at [facility], [place] [address], [place], [place] [address]  Lipase, blood     Status: None   Collection Time: 02/27/18 11:08 AM  Result Value Ref Range   Lipase 25 11 - 51 U/L    Comment: Performed at [facility], [place] [address], [place], [place] [address]  Urinalysis, Routine w reflex microscopic     Status: None   Collection Time: 02/27/18 12:45 PM  Result Value Ref Range   Color, Urine YELLOW YELLOW   APPearance CLEAR CLEAR   Specific Gravity, Urine 1.015 1.005 - 1.030   pH 5.0 5.0 - 8.0   Glucose, UA NEGATIVE NEGATIVE mg/dL   Hgb urine dipstick NEGATIVE NEGATIVE   Bilirubin Urine NEGATIVE NEGATIVE   Ketones, ur NEGATIVE NEGATIVE mg/dL   Protein, ur NEGATIVE NEGATIVE mg/dL   Nitrite NEGATIVE NEGATIVE   Leukocytes, UA NEGATIVE NEGATIVE    Comment: Performed at [facility] [address], [place], [place] [address]   Ct Abdomen Pelvis W Contrast  Result Date: 02/27/2018 CLINICAL DATA:  Generalized abdominal pain with nausea.  Vomiting. EXAM: CT ABDOMEN AND PELVIS WITH  CONTRAST TECHNIQUE: Multidetector CT imaging of the abdomen and pelvis was performed using the standard protocol following bolus administration of intravenous contrast. CONTRAST:  69m ISOVUE-300 IOPAMIDOL (ISOVUE-300) INJECTION 61% COMPARISON:  November 17, 2015 FINDINGS: Lower chest: Mild opacity in the right base dependently is favored to represent atelectasis. Emphysematous changes in the lung bases. Fluid in the distal esophagus. Coronary artery calcifications. Lung bases otherwise normal. Hepatobiliary: Hepatic steatosis. No other acute liver abnormalities identified. The gallbladder is mildly distended but demonstrates no wall thickening, pericholecystic fluid, or stones. The portal vein is patent. Pancreas: Unremarkable. No pancreatic ductal dilatation or surrounding inflammatory changes. Spleen: Normal in size without focal abnormality. Adrenals/Urinary Tract: The adrenal glands are normal. A few tiny renal cysts are noted. The kidneys are otherwise normal. The ureters and bladder are normal. Stomach/Bowel: The stomach and small bowel are normal. Colonic diverticuli are identified. There is mucosal thickening associated with the colon, most prominent in the descending colon and proximal sigmoid colon. There is also mild wall thickening in the proximal descending colon and proximal sigmoid colon with mild adjacent stranding. There is also stranding in the left pericolic gutter, likely secondary to the colonic process. Proximal to the splenic flexure, the colon is fluid-filled, thin walled, and mildly prominent in caliber measuring up to 6 cm. The appendix is not visualized. No secondary evidence of appendicitis. The terminal ileum is normal. Vascular/Lymphatic: Atherosclerotic changes are seen in the nonaneurysmal aorta, iliac vessels common femoral vessels. No adenopathy. Reproductive: Status post hysterectomy. No adnexal masses. Other: No abdominal wall hernia or abnormality. No abdominopelvic ascites.  Musculoskeletal: There is scalloping of the inferior endplate of L3 which is new since December 2016 but does not appear to be acute. The bones are otherwise normal. IMPRESSION: 1. Mucosal enhancement and mild wall thickening associated with the descending and sigmoid colon is consistent with colitis. This could be infectious, inflammatory, or the ischemic. That being said, the IMA and SMA both enhance normally. Recommend clinical correlation and attention on follow-up. 2. Proximal to the splenic flexure, the colon is borderline in caliber and fluid-filled suggesting ileus or mild relative low-grade obstruction from the more distal colitis. No small bowel dilatation. 3. Mild opacity in the right lung base is favored to represent atelectasis rather than pneumonia. 4. Fluid in the esophagus consistent with reflux. 5. Emphysematous changes in the lung bases. 6. Atherosclerosis in the aorta. 7. Scalloping of the inferior endplate of L3, new since 2016. I suspect this is nonacute based on appearance. Electronically Signed   By: Dorise Bullion III M.D   On: 02/27/2018 15:06    Pending Labs Unresulted Labs (From admission, onward)   Start     Ordered   02/28/18 0500  Comprehensive metabolic panel  Tomorrow morning,   R     02/27/18 1706   02/28/18 0500  CBC  Tomorrow morning,   R     02/27/18 1706   02/27/18 1703  CBC  (heparin)  Once,   R    Comments:  Baseline for heparin therapy IF NOT ALREADY DRAWN.  Notify MD if PLT < 100 K.    02/27/18 1706   02/27/18 1703  Creatinine, serum  (heparin)  Once,   R    Comments:  Baseline for heparin therapy IF NOT ALREADY DRAWN.    02/27/18 1706      Vitals/Pain Today's Vitals   02/27/18 1600 02/27/18 1630 02/27/18 1640 02/27/18 1700  BP: (!) 110/48 124/62 124/62 122/60  Pulse: (!) 104 (!) 109 (!) 104 (!) 101  Resp:   (!) 28   Temp:      TempSrc:      SpO2: 92% 95% 94% 95%  Weight:  Height:        Isolation Precautions No active  isolations  Medications Medications  potassium chloride 10 mEq in 100 mL IVPB (10 mEq Intravenous New Bag/Given 02/27/18 1522)  sodium chloride 0.9 % injection (has no administration in time range)  polyethylene glycol 0.4% and propylene glycol 0.3% (SYSTANE) ophthalmic gel (has no administration in time range)  Cholecalciferol TABS 2,000 Units (has no administration in time range)  pantoprazole (PROTONIX) EC tablet 40 mg (has no administration in time range)  levothyroxine (SYNTHROID, LEVOTHROID) tablet 25 mcg (has no administration in time range)  sertraline (ZOLOFT) tablet 50 mg (has no administration in time range)  traZODone (DESYREL) tablet 50 mg (has no administration in time range)  traMADol (ULTRAM) tablet 50 mg (has no administration in time range)  diltiazem (CARDIZEM CD) 24 hr capsule 240 mg (has no administration in time range)  benzonatate (TESSALON) capsule 100 mg (has no administration in time range)  predniSONE (DELTASONE) tablet 30 mg (has no administration in time range)  mometasone-formoterol (DULERA) 200-5 MCG/ACT inhaler 2 puff (has no administration in time range)  heparin injection 5,000 Units (has no administration in time range)  0.9 % NaCl with KCl 40 mEq / L  infusion (has no administration in time range)  senna-docusate (Senokot-S) tablet 1 tablet (has no administration in time range)  bisacodyl (DULCOLAX) EC tablet 5 mg (has no administration in time range)  ondansetron (ZOFRAN) tablet 4 mg (has no administration in time range)    Or  ondansetron (ZOFRAN) injection 4 mg (has no administration in time range)  morphine 2 MG/ML injection 2 mg (has no administration in time range)  ipratropium-albuterol (DUONEB) 0.5-2.5 (3) MG/3ML nebulizer solution 3 mL (has no administration in time range)  piperacillin-tazobactam (ZOSYN) IVPB 3.375 g (has no administration in time range)  sodium chloride 0.9 % bolus 500 mL (0 mLs Intravenous Stopped 02/27/18 1543)  iopamidol  (ISOVUE-300) 61 % injection (75 mLs  Contrast Given 02/27/18 1419)  fentaNYL (SUBLIMAZE) injection 25 mcg (25 mcg Intravenous Given 02/27/18 1517)  ondansetron (ZOFRAN) injection 4 mg (4 mg Intravenous Given 02/27/18 1517)    Mobility walks with person assist

## 2018-02-27 NOTE — ED Triage Notes (Signed)
Per EMS, pt comes to the ED c/o abdominal pain x1 day with distention and guarding. Constipation x 3 days. Nausea x 2 days.

## 2018-02-27 NOTE — ED Provider Notes (Signed)
[facility] DEPT Provider Note   CSN: [id number] Arrival date & time: 02/27/18  6387     History   Chief Complaint Chief Complaint  Patient presents with  . Abdominal Pain  . Constipation    HPI Heather [name] is a 83 y.o. female.  Patient complains of abdominal pain with constipation.  She reports it has been going on for 3 days.  States that she is thirsty.  She is a poor historian.  She thinks she was here 3 days ago for evaluation of a similar problem.  Her last visit here was on 02/19/18 when she was evaluated for shortness of breath.  At that time she was admitted for hypokalemia, and subsequently discharged on 02/23/2018.  Pertinent issues during the hospitalization included hypokalemia, hypomagnesemia, hypertension, hypothyroidism and depression.  She has not seen her PCP recently.  Level 5 caveat-poor historian  HPI  Past Medical History:  Diagnosis Date  . ANXIETY 07/23/2009  . Atherosclerosis of aorta ([place]) 07/24/2015  . Complication of anesthesia 7 or 8 yrs ago   woke up during colonscopy  . Coronary atherosclerosis 07/24/2015  . DEPRESSION 07/23/2009  . DIVERTICULOSIS, COLON 07/23/2009  . GERD 07/23/2009  . Headache(784.0) 07/23/2009   occasional  . Hemorrhoids   . HIP PAIN, LEFT 05/16/2010  . History of Crohn's disease   . HYPERLIPIDEMIA 07/23/2009  . HYPERTENSION 07/23/2009  . HYPOTHYROIDISM 07/23/2009  . IBS (irritable bowel syndrome)   . Insomnia   . LOW BACK PAIN, CHRONIC 10/01/2009  . PARESTHESIA 10/01/2009  . Shingles 2006   back    Patient Active Problem List   Diagnosis Date Noted  . Hypokalemia 02/19/2018  . Oral thrush 01/29/2018  . Malnutrition of moderate degree 01/29/2018  . COPD with acute exacerbation ([place]) 01/28/2018  . 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 COPD exacerbation ([place]) 05/29/2016  . Hypoxia 05/29/2016  . Coronary atherosclerosis 07/24/2015  . Atherosclerosis of aorta ([place]) 07/24/2015    . Dysphagia 07/24/2015  . Acute respiratory failure with hypoxia ([place]) 07/23/2015  . Bronchospasm with bronchitis, acute 07/23/2015  . HIP PAIN, LEFT 05/16/2010  . LOW BACK PAIN, CHRONIC 10/01/2009  . PARESTHESIA 10/01/2009  . Hypothyroidism 07/23/2009  . Dyslipidemia 07/23/2009  . Anxiety state 07/23/2009  . Anxiety and depression 07/23/2009  . Essential hypertension 07/23/2009  . GERD 07/23/2009  . DIVERTICULOSIS, COLON 07/23/2009  . HEADACHE 07/23/2009    Past Surgical History:  Procedure Laterality Date  . ABDOMINAL HYSTERECTOMY  age 25 or 80  . APPENDECTOMY  yrs ago  . BILATERAL SALPINGOOPHORECTOMY  age 35 or 21  . COLONOSCOPY N/A 11/17/2013   Procedure: COLONOSCOPY;  Surgeon: Cleotis Nipper, MD;  Location: [facility] END[facility]COPY;  Service: Endoscopy;  Laterality: N/A;  . ESOPHAGOGASTRODUODENOSCOPY N/A 11/17/2013   Procedure: ESOPHAGOGASTRODUODENOSCOPY (EGD);  Surgeon: Cleotis Nipper, MD;  Location: [facility];  Service: Endoscopy;  Laterality: N/A;  . HEMORRHOID SURGERY  yrs ago  . TONSILLECTOMY  yrs ago     OB History   None      Home Medications    Prior to Admission medications   Medication Sig Start Date End Date Taking? Authorizing Provider  albuterol (PROVENTIL) (2.5 MG/3ML) 0.083% nebulizer solution INHALE CONTENTS OF 1 AMP VIA NEBULIZER EVERY 6 HRS AS NEEDED FOR WHEEZING/ SHORTNESS OF BREATH. 12/17/17  Yes [name], MD  Albuterol Sulfate (PROAIR RESPICLICK) 564 (90 Base) MCG/ACT AEPB Inhale 2 puffs into the lungs 4 (four) times daily as  needed. Patient taking differently: Inhale 2 puffs into the lungs 4 (four) times daily as needed (sob and wheezing).  01/26/18  Yes [name], MD  benzonatate (TESSALON) 100 MG capsule TAKE 1 CAPSULE TWICE DAILY AS NEEDED FOR COUGH. 02/11/18  Yes [name], MD  Cholecalciferol 2000 UNITS TABS Take 2,000 Units by mouth daily.   Yes [provider]  clorazepate (TRANXENE) 7.5 MG tablet TAKE  (1) TABLET TWICE DAILY AS NEEDED FOR ANXIETY. 02/11/18  Yes Laurey Morale, MD  Dextromethorphan-guaiFENesin ([place] FAST-MAX DM MAX) 5-100 MG/5ML LIQD Take 15 mLs by mouth every 4 (four) hours as needed (chest congestion).   Yes [provider]  diltiazem (CARDIZEM CD) 240 MG 24 hr capsule TAKE (1) CAPSULE DAILY. 02/11/18  Yes [name], MD  esomeprazole (NEXIUM) 40 MG capsule TAKE 1 CAPSULE DAILY BEFORE BREAKFAST. 06/26/17  Yes [name], MD  levothyroxine (SYNTHROID, LEVOTHROID) 25 MCG tablet Take 1 tablet (25 mcg total) by mouth daily before breakfast. 06/26/17  Yes [name], MD  mometasone-formoterol [facility]) 200-5 MCG/ACT AERO Inhale 2 puffs into the lungs 2 (two) times daily. 02/23/18  Yes Patrecia Pour, Christean Grief, MD  Polyethyl Glycol-Propyl Glycol (SYSTANE OP) Place 1 drop into both eyes 2 (two) times daily as needed (for dry eyes).    Yes [provider]  predniSONE (DELTASONE) 10 MG tablet Take 4 tablets for 3 days; Take 3 tablets for 4 days; Take 2 tablets for 3 days; Take 1 tablet for 4 days Patient taking differently: Take 30 mg by mouth daily. Take 4 tablets for 3 days; Take 3 tablets for 4 days; Take 2 tablets for 3 days; Take 1 tablet for 4 days 02/23/18  Yes Patrecia Pour, Christean Grief, MD  sertraline (ZOLOFT) 50 MG tablet Take 1 tablet (50 mg total) by mouth daily. 09/28/17  Yes [name], MD  tiotropium (SPIRIVA HANDIHALER) 18 MCG inhalation capsule Place 1 capsule (18 mcg total) into inhaler and inhale daily. 01/26/18  Yes [name], MD  traMADol (ULTRAM) 50 MG tablet Take 1 tablet (50 mg total) by mouth every 6 (six) hours as needed. 01/26/18  Yes [name], MD  traZODone (DESYREL) 50 MG tablet TAKE 1/2 TO 1 TABLET AT BEDTIME AS NEEDED FOR REST. 01/18/18  Yes [name], MD  zolpidem (AMBIEN) 5 MG tablet TAKE 1 TABLET AT BEDTIME AS NEEDED. Patient taking differently: TAKE 1 TABLET PO AT BEDTIME AS NEEDED FOR  SLEEP 02/09/18  Yes Dorena Cookey, MD    Family History Family History  Problem Relation Age of Onset  . Cancer Mother        Unknown type  . COPD Sister   . Cancer Sister        Breast  . Clotting disorder Neg Hx     Social History Social History   Tobacco Use  . Smoking status: Never Smoker  . Smokeless tobacco: Never Used  . Tobacco comment: smoked rarely in youth  Substance Use Topics  . Alcohol use: No    Alcohol/week: 0.0 oz  . Drug use: No     Allergies   Tylenol [acetaminophen]; Symbicort [budesonide-formoterol fumarate]; Duloxetine; Metronidazole; and Rofecoxib   Review of Systems Review of Systems  Unable to perform ROS: Other     Physical Exam Updated Vital Signs BP 124/62 (BP Location: Left Arm)   Pulse (!) 104   Temp 98.1 F (36.7 C) (Oral)   Resp (!) 28  Ht 4\' 11"  (1.499 m)   Wt 47.2 kg (104 lb)   SpO2 94%   BMI 21.01 kg/m   Physical Exam  Constitutional: She is oriented to person, place, and time. She appears well-developed.  Elderly, frail  HENT:  Head: Normocephalic and atraumatic.  Oral mucous members are dry.  Eyes: Pupils are equal, round, and reactive to light. Conjunctivae and EOM are normal.  Neck: Normal range of motion and phonation normal. Neck supple.  Cardiovascular: Regular rhythm and normal heart sounds.  Tachycardic  Pulmonary/Chest: Effort normal and breath sounds normal. She exhibits no tenderness.  Abdominal: Soft. She exhibits no distension. There is tenderness (Diffuse, moderate). There is no guarding.  Genitourinary:  Genitourinary Comments: Normal anus.  Rectal vault empty.  Mucus sent for Hemoccult testing.  Musculoskeletal:  Back is diffusely tender without palpable deformity of the cervical thoracic or lumbar spines.  Neurological: She is alert and oriented to person, place, and time. She exhibits normal muscle tone.  Skin: Skin is warm and dry.  Psychiatric: Her behavior is normal.  Anxious  Nursing note  and vitals reviewed.    ED Treatments / Results  Labs (all labs ordered are listed, but only abnormal results are displayed) Labs Reviewed  COMPREHENSIVE METABOLIC PANEL - Abnormal; Notable for the following components:      Result Value   Potassium 3.1 (*)    Chloride 98 (*)    Glucose, Bld 123 (*)    BUN 35 (*)    Creatinine, Ser 1.16 (*)    Calcium 8.1 (*)    Total Protein 5.7 (*)    Albumin 2.9 (*)    GFR calc non Af Amer 43 (*)    GFR calc Af Amer 50 (*)    All other components within normal limits  CBC WITH DIFFERENTIAL/PLATELET - Abnormal; Notable for the following components:   WBC 33.0 (*)    RBC 5.25 (*)    RDW 16.1 (*)    Platelets 773 (*)    Neutro Abs 27.3 (*)    Monocytes Absolute 4.0 (*)    All other components within normal limits  LIPASE, BLOOD  URINALYSIS, ROUTINE W REFLEX MICROSCOPIC  POC OCCULT BLOOD, ED    EKG None  Radiology Ct Abdomen Pelvis W Contrast  Result Date: 02/27/2018 CLINICAL DATA:  Generalized abdominal pain with nausea.  Vomiting. EXAM: CT ABDOMEN AND PELVIS WITH CONTRAST TECHNIQUE: Multidetector CT imaging of the abdomen and pelvis was performed using the standard protocol following bolus administration of intravenous contrast. CONTRAST:  53mL ISOVUE-300 IOPAMIDOL (ISOVUE-300) INJECTION 61% COMPARISON:  November 17, 2015 FINDINGS: Lower chest: Mild opacity in the right base dependently is favored to represent atelectasis. Emphysematous changes in the lung bases. Fluid in the distal esophagus. Coronary artery calcifications. Lung bases otherwise normal. Hepatobiliary: Hepatic steatosis. No other acute liver abnormalities identified. The gallbladder is mildly distended but demonstrates no wall thickening, pericholecystic fluid, or stones. The portal vein is patent. Pancreas: Unremarkable. No pancreatic ductal dilatation or surrounding inflammatory changes. Spleen: Normal in size without focal abnormality. Adrenals/Urinary Tract: The adrenal  glands are normal. A few tiny renal cysts are noted. The kidneys are otherwise normal. The ureters and bladder are normal. Stomach/Bowel: The stomach and small bowel are normal. Colonic diverticuli are identified. There is mucosal thickening associated with the colon, most prominent in the descending colon and proximal sigmoid colon. There is also mild wall thickening in the proximal descending colon and proximal sigmoid colon with mild adjacent stranding.  There is also stranding in the left pericolic gutter, likely secondary to the colonic process. Proximal to the splenic flexure, the colon is fluid-filled, thin walled, and mildly prominent in caliber measuring up to 6 cm. The appendix is not visualized. No secondary evidence of appendicitis. The terminal ileum is normal. Vascular/Lymphatic: Atherosclerotic changes are seen in the nonaneurysmal aorta, iliac vessels common femoral vessels. No adenopathy. Reproductive: Status post hysterectomy. No adnexal masses. Other: No abdominal wall hernia or abnormality. No abdominopelvic ascites. Musculoskeletal: There is scalloping of the inferior endplate of L3 which is new since December 2016 but does not appear to be acute. The bones are otherwise normal. IMPRESSION: 1. Mucosal enhancement and mild wall thickening associated with the descending and sigmoid colon is consistent with colitis. This could be infectious, inflammatory, or the ischemic. That being said, the IMA and SMA both enhance normally. Recommend clinical correlation and attention on follow-up. 2. Proximal to the splenic flexure, the colon is borderline in caliber and fluid-filled suggesting ileus or mild relative low-grade obstruction from the more distal colitis. No small bowel dilatation. 3. Mild opacity in the right lung base is favored to represent atelectasis rather than pneumonia. 4. Fluid in the esophagus consistent with reflux. 5. Emphysematous changes in the lung bases. 6. Atherosclerosis in the  aorta. 7. Scalloping of the inferior endplate of L3, new since 2016. I suspect this is nonacute based on appearance. Electronically Signed   By: Dorise Bullion III M.D   On: 02/27/2018 15:06    Procedures Procedures (including critical care time)  Medications Ordered in ED Medications  0.9 %  sodium chloride infusion ( Intravenous New Bag/Given 02/27/18 1203)  potassium chloride 10 mEq in 100 mL IVPB (10 mEq Intravenous New Bag/Given 02/27/18 1522)  sodium chloride 0.9 % injection (has no administration in time range)  ciprofloxacin (CIPRO) IVPB 400 mg (has no administration in time range)  metroNIDAZOLE (FLAGYL) IVPB 500 mg (has no administration in time range)  sodium chloride 0.9 % bolus 500 mL (0 mLs Intravenous Stopped 02/27/18 1543)  iopamidol (ISOVUE-300) 61 % injection (75 mLs  Contrast Given 02/27/18 1419)  fentaNYL (SUBLIMAZE) injection 25 mcg (25 mcg Intravenous Given 02/27/18 1517)  ondansetron (ZOFRAN) injection 4 mg (4 mg Intravenous Given 02/27/18 1517)     Initial Impression / Assessment and Plan / ED Course  I have reviewed the triage vital signs and the nursing notes.  Pertinent labs & imaging results that were available during my care of the patient were reviewed by me and considered in my medical decision making (see chart for details).  Clinical Course as of Feb 28 1647  Sat Feb 27, 2018  1040 She presents for evaluation of abdominal pain associated with decreased stooling.  She is a poor historian.  Initial vital signs are abnormal with elevated blood pressure and pulse.  Remainder normal.  History and exam completed.   [EW]  6295 At this time she remains uncomfortable, and her abdomen is distended and diffusely tender.  CT imaging reviewed by me is consistent with small bowel obstruction.  IV narcotic ordered and will await radiologist's formal interpretation prior to initiating further treatment.   [EW]  1446 Normal  Urinalysis, Routine w reflex microscopic [EW]    1446 Normal except hypokalemia and hypochloremia, elevated glucose, elevated BUN, elevated creatinine, low calcium and protein.  Comprehensive metabolic panel(!) [EW]  2841 Normal   [EW]  1447 Elevated white blood cell count with left shift and normal hemoglobin.  CBC with  Differential(!) [EW]  1622 Evaluation is consistent with colitis and nonspecific partial colon and small bowel obstruction  CT Abdomen Pelvis W Contrast [EW]    Clinical Course User Index [EW] Daleen Bo, MD     Patient Vitals for the past 24 hrs:  BP Temp Temp src Pulse Resp SpO2 Height Weight  02/27/18 1640 124/62 - - (!) 104 (!) 28 94 % - -  02/27/18 1530 (!) 126/56 - - (!) 108 - 91 % - -  02/27/18 1500 (!) 130/51 - - (!) 102 - 91 % - -  02/27/18 1445 - - - (!) 101 - 97 % - -  02/27/18 1430 (!) 146/136 - - - - - - -  02/27/18 1410 (!) 140/109 - - (!) 111 12 93 % - -  02/27/18 1207 - - - - - - 4\' 11"  (1.499 m) 47.2 kg (104 lb)  02/27/18 0948 (!) 151/70 98.1 F (36.7 C) Oral (!) 112 16 94 % - -    4:34 PM-Consult complete with hospitalist. Patient case explained and discussed.  He agrees to admit patient for further evaluation and treatment. Call ended at 4:42 PM  4:44 PM Reevaluation with update and discussion. After initial assessment and treatment, an updated evaluation reveals no change in clinical status, no vomiting now, discussed with the patient. Daleen Bo   MDM-colitis, unspecified with broad differential.  Patient symptomatic with decreased stooling and decreased appetite and partial small bowel and colonic obstruction.  Not currently vomiting.  She was begun emergency department, IV fluids, potassium, and empiric antibiotics.  Patient will require admission for ongoing management and possible consultation with GI.  No immediate indication for surgical intervention.  Doubt serious bacterial infection or impending vascular collapse.   Final Clinical Impressions(s) / ED Diagnoses   Final  diagnoses:  Colitis  Hypokalemia  Dehydration    ED Discharge Orders    None       Daleen Bo, MD 02/27/18 1650

## 2018-02-27 NOTE — Progress Notes (Signed)
ED RN reports pt needs one more K+ IVB, she has 3rd one & will send up. Reminded her to chart 2nd K+ IVB. Brynley Cuddeback, [employer]

## 2018-02-27 NOTE — H&P (Signed)
History and Physical    Heather [name] HMC:[id number] DOB: July 14, 1935 DOA: 02/27/2018  PCP: [name], MD Patient coming from: Home  Chief Complaint: Abdominal pain  HPI: Heather [name] is a 83 y.o. female with medical history significant of, depression, hypothyroidism, hypertension, insomnia comes to the hospital with complaints of abdominal pain.  Patient was recently admitted here for acute hypoxic respiratory distress secondary to COPD exacerbation and discharged on February 22, 2018.  She was discharged on 14-day steroid taper and bronchodilators.  Patient stated after going home she started developing left-sided abdominal pain which she describes as sharp and crampy along with feeling of nausea without any vomiting.  Due to this she was unable to tolerate any oral intake.  She denies any fevers, chills, diarrhea.  States typically she suffers from constipation and has not had a bowel movement last 4-5 days. Had his colonoscopy and endoscopy back in 2014, colonoscopy showed diverticulosis and flat polyp in the proximal colon.  Endoscopy was normal  In the ER today she appeared in mild distress due to left-sided abdominal pain.  Her vital signs are unremarkable.  On her labs she was noted to have hypokalemic potassium was 3.1, creatinine 1.16 which is up from baseline of 1.16.  WBC was elevated at 33.0.  CT of the abdomen pelvis was suggestive of distal sigmoid colitis, ileus versus partial small bowel obstruction.  Case was discussed with general surgery by the ER physician who recommended medical management and if necessary requested formal consult.  Patient was admitted to medical reasons   Review of Systems: As per HPI otherwise 10 point review of systems negative.  Review of Systems Otherwise negative except as per HPI, including: General: Denies fever, chills, night sweats or unintended weight loss. Resp: Denies cough, wheezing, shortness of breath. Cardiac: Denies  chest pain, palpitations, orthopnea, paroxysmal nocturnal dyspnea. GI: Denies hematemesis GU: Denies dysuria, frequency, hesitancy or incontinence MS: Denies muscle aches, joint pain or swelling Neuro: Denies headache, neurologic deficits (focal weakness, numbness, tingling), abnormal gait Psych: Denies anxiety, depression, SI/HI/AVH Skin: Denies new rashes or lesions ID: Denies sick contacts, exotic exposures, travel  Past Medical History:  Diagnosis Date  . ANXIETY 07/23/2009  . Atherosclerosis of aorta ([place]) 07/24/2015  . Complication of anesthesia 7 or 8 yrs ago   woke up during colonscopy  . Coronary atherosclerosis 07/24/2015  . DEPRESSION 07/23/2009  . DIVERTICULOSIS, COLON 07/23/2009  . GERD 07/23/2009  . Headache(784.0) 07/23/2009   occasional  . Hemorrhoids   . HIP PAIN, LEFT 05/16/2010  . History of Crohn's disease   . HYPERLIPIDEMIA 07/23/2009  . HYPERTENSION 07/23/2009  . HYPOTHYROIDISM 07/23/2009  . IBS (irritable bowel syndrome)   . Insomnia   . LOW BACK PAIN, CHRONIC 10/01/2009  . PARESTHESIA 10/01/2009  . Shingles 2006   back    Past Surgical History:  Procedure Laterality Date  . ABDOMINAL HYSTERECTOMY  age 75 or 42  . APPENDECTOMY  yrs ago  . BILATERAL SALPINGOOPHORECTOMY  age 84 or 51  . COLONOSCOPY N/A 11/17/2013   Procedure: COLONOSCOPY;  Surgeon: Cleotis Nipper, MD;  Location: [facility];  Service: Endoscopy;  Laterality: N/A;  . ESOPHAGOGASTRODUODENOSCOPY N/A 11/17/2013   Procedure: ESOPHAGOGASTRODUODENOSCOPY (EGD);  Surgeon: Cleotis Nipper, MD;  Location: [facility];  Service: Endoscopy;  Laterality: N/A;  . HEMORRHOID SURGERY  yrs ago  . TONSILLECTOMY  yrs ago    SOCIAL HISTORY:  reports that she has never smoked. She has never used smokeless  tobacco. She reports that she does not drink alcohol or use drugs.  Allergies  Allergen Reactions  . Tylenol [Acetaminophen] Other (See Comments)    Nightmares  . Symbicort [Budesonide-Formoterol  Fumarate] Other (See Comments)    Pt felt like her tongue was swollen, could not swallow  . Duloxetine Other (See Comments)    Patient doesn't recall  . Metronidazole Other (See Comments)    Patient doesn't recall   . Rofecoxib Other (See Comments)    Patient doesn't recall     FAMILY HISTORY: Family History  Problem Relation Age of Onset  . Cancer Mother        Unknown type  . COPD Sister   . Cancer Sister        Breast  . Clotting disorder Neg Hx      Prior to Admission medications   Medication Sig Start Date End Date Taking? Authorizing Provider  albuterol (PROVENTIL) (2.5 MG/3ML) 0.083% nebulizer solution INHALE CONTENTS OF 1 AMP VIA NEBULIZER EVERY 6 HRS AS NEEDED FOR WHEEZING/ SHORTNESS OF BREATH. 12/17/17  Yes [name], MD  Albuterol Sulfate (PROAIR RESPICLICK) 353 (90 Base) MCG/ACT AEPB Inhale 2 puffs into the lungs 4 (four) times daily as needed. Patient taking differently: Inhale 2 puffs into the lungs 4 (four) times daily as needed (sob and wheezing).  01/26/18  Yes [name], MD  benzonatate (TESSALON) 100 MG capsule TAKE 1 CAPSULE TWICE DAILY AS NEEDED FOR COUGH. 02/11/18  Yes [name], MD  Cholecalciferol 2000 UNITS TABS Take 2,000 Units by mouth daily.   Yes [provider]  clorazepate (TRANXENE) 7.5 MG tablet TAKE (1) TABLET TWICE DAILY AS NEEDED FOR ANXIETY. 02/11/18  Yes Laurey Morale, MD  Dextromethorphan-guaiFENesin ([place] FAST-MAX DM MAX) 5-100 MG/5ML LIQD Take 15 mLs by mouth every 4 (four) hours as needed (chest congestion).   Yes [provider]  diltiazem (CARDIZEM CD) 240 MG 24 hr capsule TAKE (1) CAPSULE DAILY. 02/11/18  Yes [name], MD  esomeprazole (NEXIUM) 40 MG capsule TAKE 1 CAPSULE DAILY BEFORE BREAKFAST. 06/26/17  Yes [name], MD  levothyroxine (SYNTHROID, LEVOTHROID) 25 MCG tablet Take 1 tablet (25 mcg total) by mouth daily before breakfast. 06/26/17  Yes [name], MD  mometasone-formoterol [facility]) 200-5 MCG/ACT AERO Inhale 2 puffs into the lungs 2 (two) times daily. 02/23/18  Yes Patrecia Pour, Christean Grief, MD  Polyethyl Glycol-Propyl Glycol (SYSTANE OP) Place 1 drop into both eyes 2 (two) times daily as needed (for dry eyes).    Yes [provider]  predniSONE (DELTASONE) 10 MG tablet Take 4 tablets for 3 days; Take 3 tablets for 4 days; Take 2 tablets for 3 days; Take 1 tablet for 4 days Patient taking differently: Take 30 mg by mouth daily. Take 4 tablets for 3 days; Take 3 tablets for 4 days; Take 2 tablets for 3 days; Take 1 tablet for 4 days 02/23/18  Yes Patrecia Pour, Christean Grief, MD  sertraline (ZOLOFT) 50 MG tablet Take 1 tablet (50 mg total) by mouth daily. 09/28/17  Yes [name], MD  tiotropium (SPIRIVA HANDIHALER) 18 MCG inhalation capsule Place 1 capsule (18 mcg total) into inhaler and inhale daily. 01/26/18  Yes [name], MD  traMADol (ULTRAM) 50 MG tablet Take 1 tablet (50 mg total) by mouth every 6 (six) hours as needed. 01/26/18  Yes [name], MD  traZODone (DESYREL) 50 MG tablet TAKE 1/2 TO 1 TABLET AT BEDTIME  AS NEEDED FOR REST. 01/18/18  Yes [name], MD  zolpidem (AMBIEN) 5 MG tablet TAKE 1 TABLET AT BEDTIME AS NEEDED. Patient taking differently: TAKE 1 TABLET PO AT BEDTIME AS NEEDED FOR SLEEP 02/09/18  Yes Dorena Cookey, MD    Physical Exam: Vitals:   02/27/18 1445 02/27/18 1500 02/27/18 1530 02/27/18 1640  BP:  (!) 130/51 (!) 126/56 124/62  Pulse: (!) 101 (!) 102 (!) 108 (!) 104  Resp:    (!) 28  Temp:      TempSrc:      SpO2: 97% 91% 91% 94%  Weight:      Height:          Constitutional: NAD, calm, comfortable, elderly frail-appearing Eyes: PERRL, lids and conjunctivae normal ENMT: Mucous membranes are dry posterior pharynx clear of any exudate or lesions.Normal dentition.  Neck: normal, supple, no masses, no thyromegaly Respiratory: clear to auscultation bilaterally, no  wheezing, no crackles. Normal respiratory effort. No accessory muscle use.  Cardiovascular: Regular rate and rhythm, no murmurs / rubs / gallops. No extremity edema. 2+ pedal pulses. No carotid bruits.  Abdomen: Tender to deep palpation on the left side, diminished bowel sounds.  No masses felt Musculoskeletal: no clubbing / cyanosis. No joint deformity upper and lower extremities. Good ROM, no contractures. Normal muscle tone.  Skin: no rashes, lesions, ulcers. No induration Neurologic: CN 2-12 grossly intact. Sensation intact, DTR normal. Strength 5/5 in all 4.  Psychiatric: Normal judgment and insight. Alert and oriented x 3. Normal mood.     Labs on Admission: I have personally reviewed following labs and imaging studies  CBC: Recent Labs  Lab 02/27/18 1108  WBC 33.0*  NEUTROABS 27.3*  HGB 14.6  HCT 45.8  MCV 87.2  PLT 948*   Basic Metabolic Panel: Recent Labs  Lab 02/21/18 0602 02/22/18 0548 02/27/18 1108  NA 138 139 140  K 5.1 5.0 3.1*  CL 108 108 98*  CO2 22 24 27   GLUCOSE 183* 138* 123*  BUN 9 11 35*  CREATININE 0.64 0.63 1.16*  CALCIUM 8.4* 8.5* 8.1*  MG 2.7*  --   --    GFR: Estimated Creatinine Clearance: 26.4 mL/min (A) (by C-G formula based on SCr of 1.16 mg/dL (H)). Liver Function Tests: Recent Labs  Lab 02/27/18 1108  AST 27  ALT 16  ALKPHOS 101  BILITOT 0.3  PROT 5.7*  ALBUMIN 2.9*   Recent Labs  Lab 02/27/18 1108  LIPASE 25   No results for input(s): AMMONIA in the last 168 hours. Coagulation Profile: No results for input(s): INR, PROTIME in the last 168 hours. Cardiac Enzymes: No results for input(s): CKTOTAL, CKMB, CKMBINDEX, TROPONINI in the last 168 hours. BNP (last 3 results) No results for input(s): PROBNP in the last 8760 hours. HbA1C: No results for input(s): HGBA1C in the last 72 hours. CBG: No results for input(s): GLUCAP in the last 168 hours. Lipid Profile: No results for input(s): CHOL, HDL, LDLCALC, TRIG, CHOLHDL,  LDLDIRECT in the last 72 hours. Thyroid Function Tests: No results for input(s): TSH, T4TOTAL, FREET4, T3FREE, THYROIDAB in the last 72 hours. Anemia Panel: No results for input(s): VITAMINB12, FOLATE, FERRITIN, TIBC, IRON, RETICCTPCT in the last 72 hours. Urine analysis:    Component Value Date/Time   COLORURINE YELLOW 02/27/2018 [place] 02/27/2018 1245   LABSPEC 1.015 02/27/2018 1245   PHURINE 5.0 02/27/2018 1245   GLUCOSEU NEGATIVE 02/27/2018 1245   HGBUR NEGATIVE 02/27/2018 1245  HGBUR trace-lysed 11/15/2010 [place] 02/27/2018 1245   KETONESUR NEGATIVE 02/27/2018 1245   PROTEINUR NEGATIVE 02/27/2018 1245   UROBILINOGEN 0.2 08/22/2014 1105   NITRITE NEGATIVE 02/27/2018 1245   LEUKOCYTESUR NEGATIVE 02/27/2018 1245   Sepsis Labs: !!!!!!!!!!!!!!!!!!!!!!!!!!!!!!!!!!!!!!!!!!!! @LABRCNTIP (procalcitonin:4,lacticidven:4) )No results found for this or any previous visit (from the past 240 hour(s)).   Radiological Exams on Admission: Ct Abdomen Pelvis W Contrast  Result Date: 02/27/2018 CLINICAL DATA:  Generalized abdominal pain with nausea.  Vomiting. EXAM: CT ABDOMEN AND PELVIS WITH CONTRAST TECHNIQUE: Multidetector CT imaging of the abdomen and pelvis was performed using the standard protocol following bolus administration of intravenous contrast. CONTRAST:  67mL ISOVUE-300 IOPAMIDOL (ISOVUE-300) INJECTION 61% COMPARISON:  November 17, 2015 FINDINGS: Lower chest: Mild opacity in the right base dependently is favored to represent atelectasis. Emphysematous changes in the lung bases. Fluid in the distal esophagus. Coronary artery calcifications. Lung bases otherwise normal. Hepatobiliary: Hepatic steatosis. No other acute liver abnormalities identified. The gallbladder is mildly distended but demonstrates no wall thickening, pericholecystic fluid, or stones. The portal vein is patent. Pancreas: Unremarkable. No pancreatic ductal dilatation or surrounding  inflammatory changes. Spleen: Normal in size without focal abnormality. Adrenals/Urinary Tract: The adrenal glands are normal. A few tiny renal cysts are noted. The kidneys are otherwise normal. The ureters and bladder are normal. Stomach/Bowel: The stomach and small bowel are normal. Colonic diverticuli are identified. There is mucosal thickening associated with the colon, most prominent in the descending colon and proximal sigmoid colon. There is also mild wall thickening in the proximal descending colon and proximal sigmoid colon with mild adjacent stranding. There is also stranding in the left pericolic gutter, likely secondary to the colonic process. Proximal to the splenic flexure, the colon is fluid-filled, thin walled, and mildly prominent in caliber measuring up to 6 cm. The appendix is not visualized. No secondary evidence of appendicitis. The terminal ileum is normal. Vascular/Lymphatic: Atherosclerotic changes are seen in the nonaneurysmal aorta, iliac vessels common femoral vessels. No adenopathy. Reproductive: Status post hysterectomy. No adnexal masses. Other: No abdominal wall hernia or abnormality. No abdominopelvic ascites. Musculoskeletal: There is scalloping of the inferior endplate of L3 which is new since December 2016 but does not appear to be acute. The bones are otherwise normal. IMPRESSION: 1. Mucosal enhancement and mild wall thickening associated with the descending and sigmoid colon is consistent with colitis. This could be infectious, inflammatory, or the ischemic. That being said, the IMA and SMA both enhance normally. Recommend clinical correlation and attention on follow-up. 2. Proximal to the splenic flexure, the colon is borderline in caliber and fluid-filled suggesting ileus or mild relative low-grade obstruction from the more distal colitis. No small bowel dilatation. 3. Mild opacity in the right lung base is favored to represent atelectasis rather than pneumonia. 4. Fluid in the  esophagus consistent with reflux. 5. Emphysematous changes in the lung bases. 6. Atherosclerosis in the aorta. 7. Scalloping of the inferior endplate of L3, new since 2016. I suspect this is nonacute based on appearance. Electronically Signed   By: Dorise Bullion III M.D   On: 02/27/2018 15:06     All images have been reviewed by me personally.    Assessment/Plan Principal Problem:   Colitis Active Problems:   Hypothyroidism   Dyslipidemia   Anxiety and depression   Essential hypertension   COPD with emphysema (HCC)   Malnutrition of moderate degree   Hypokalemia   Abdominal pain   SBO (small bowel obstruction) ([place])  Left-sided abdominal pain, crampy Descending and sigmoid colon colitis Nausea without vomiting Partial small bowel obstruction versus ileus -Admit the patient to the hospital for further care.  Colitis could be infectious or ischemic but overall bigger mesenteric vessels look patent. -We will keep the patient on clear liquid diet.  Advance as tolerated.  Start patient on Zosyn -She is not having any diarrhea therefore no stool studies -NG tube is not necessary at this point. PRN antiemetics.  We will treat her hypokalemia which can contribute to ileus -Provide supportive care, monitor urine output -General surgery curbside in the ER-recommends medical management -Pain control  C scope 2014: Scattered diverticulosis, flat polyp in the proximal colon EGD 2014: Normal endoscopy  Leukocytosis -Multifactorial in nature dehydration, infection and also being on steroids.  Currently she is getting treatment for all of them as discussed individually  Acute kidney injury -Baseline creatinine 0.6, today is 1.1.  Likely prerenal in nature from poor p.o. intake -She is getting gentle hydration, will trend creatinine.  No further workup indicated at this time unless does not improve with hydration  Hypokalemia -Getting repletion, PA tomorrow morning.  Possible  right-sided atelectasis COPD with emphysema, recent exacerbation-still recovering -We will continue her home taper dose of steroids, continue nebulizer treatments -Incentive spirometry has been ordered, supplemental oxygen as needed -Unlikely pneumonia but she is already on Zosyn  History of essential hypertension -Continue same diltiazem  Depression and insomnia - Continue home medications sertraline, trazodone  Hypothyroidism -Continue Synthroid 25 mcg   DVT prophylaxis: Subcutaneous heparin  code Status: Code Family Communication: Son at bedside Disposition Plan: To be determined Consults called: General surgery curbside by the ED physician, recommended medical treatment for partial small bowel obstruction Admission status: Inpatient admission    Ankit Arsenio Loader MD [facility] Pager [phone]  If 7PM-7AM, please contact night-coverage [email or web address] Password [facility]  02/27/2018, 5:08 PM   Please Note: This patient record was dictated using Editor, commissioning. Chart creation errors have been sought, but may not always have been located. Such creation errors do not reflect on the Standard of Medical Care.

## 2018-02-28 ENCOUNTER — Encounter (HOSPITAL_COMMUNITY): Payer: Self-pay | Admitting: *Deleted

## 2018-02-28 LAB — CBC
HCT: 40 % (ref 36.0–46.0)
Hemoglobin: 12.4 g/dL (ref 12.0–15.0)
MCH: 27.1 pg (ref 26.0–34.0)
MCHC: 31 g/dL (ref 30.0–36.0)
MCV: 87.5 fL (ref 78.0–100.0)
Platelets: 485 10*3/uL — ABNORMAL HIGH (ref 150–400)
RBC: 4.57 MIL/uL (ref 3.87–5.11)
RDW: 16.5 % — ABNORMAL HIGH (ref 11.5–15.5)
WBC: 29.8 10*3/uL — ABNORMAL HIGH (ref 4.0–10.5)

## 2018-02-28 LAB — COMPREHENSIVE METABOLIC PANEL
ALT: 10 U/L — ABNORMAL LOW (ref 14–54)
AST: 17 U/L (ref 15–41)
Albumin: 2.2 g/dL — ABNORMAL LOW (ref 3.5–5.0)
Alkaline Phosphatase: 104 U/L (ref 38–126)
Anion gap: 8 (ref 5–15)
BUN: 24 mg/dL — ABNORMAL HIGH (ref 6–20)
CO2: 26 mmol/L (ref 22–32)
Calcium: 7.2 mg/dL — ABNORMAL LOW (ref 8.9–10.3)
Chloride: 102 mmol/L (ref 101–111)
Creatinine, Ser: 0.88 mg/dL (ref 0.44–1.00)
GFR calc Af Amer: >60 mL/min (ref >60)
GFR calc non Af Amer: >60 mL/min (ref >60)
Glucose, Bld: 83 mg/dL (ref 65–99)
Potassium: 4.2 mmol/L (ref 3.5–5.1)
Sodium: 136 mmol/L (ref 135–145)
Total Bilirubin: 1 mg/dL (ref 0.3–1.2)
Total Protein: 4.8 g/dL — ABNORMAL LOW (ref 6.5–8.1)

## 2018-02-28 MED ORDER — ALBUTEROL SULFATE (2.5 MG/3ML) 0.083% IN NEBU
INHALATION_SOLUTION | RESPIRATORY_TRACT | Status: AC
  Filled 2018-02-28: qty 3

## 2018-02-28 MED ORDER — ALBUTEROL SULFATE (2.5 MG/3ML) 0.083% IN NEBU
2.5000 mg | INHALATION_SOLUTION | RESPIRATORY_TRACT | Status: DC | PRN
  Administered 2018-02-28 – 2018-03-01 (×2): 2.5 mg via RESPIRATORY_TRACT
  Filled 2018-02-28: qty 3

## 2018-02-28 MED ORDER — ALBUTEROL SULFATE (2.5 MG/3ML) 0.083% IN NEBU
2.5000 mg | INHALATION_SOLUTION | Freq: Three times a day (TID) | RESPIRATORY_TRACT | Status: DC
  Administered 2018-03-01 – 2018-03-05 (×10): 2.5 mg via RESPIRATORY_TRACT
  Filled 2018-02-28 (×15): qty 3

## 2018-02-28 MED ORDER — ALBUTEROL SULFATE (2.5 MG/3ML) 0.083% IN NEBU: 2.5000 mg | INHALATION_SOLUTION | Freq: Two times a day (BID) | RESPIRATORY_TRACT | Status: DC

## 2018-02-28 MED ORDER — TAMSULOSIN HCL 0.4 MG PO CAPS
0.4000 mg | ORAL_CAPSULE | Freq: Every day | ORAL | Status: DC
  Administered 2018-02-28 – 2018-03-12 (×9): 0.4 mg via ORAL
  Filled 2018-02-28 (×10): qty 1

## 2018-02-28 NOTE — Progress Notes (Signed)
Patient had been unable to void since admission at [place].   We were unsuccessful with the [place] as well.  Bladder scan showed at least 792 cc of urine in the bladder.  After 2 unsuccessful attempts to do an I&O cath, we tried a 14 fr foley and were successful on the 2nd attempt, resulting in an immediate return of 900 cc of clear, amber colored urine.  Patient reported previous trouble being catheterized as well.   The meatus is very close to and almost buried just inside the vagina.

## 2018-02-28 NOTE — Progress Notes (Signed)
Patient Demographics:    Heather [name], is a 83 y.o. female, DOB - 13-Jan-1935, WEX:[id number]  Admit date - 02/27/2018   Admitting Physician Ankit Arsenio Loader, MD  Outpatient Primary MD for the patient is [name], MD  LOS - 1   Chief Complaint  Patient presents with  . Abdominal Pain  . Constipation        Subjective:    [name] today has no fevers, no emesis,  No chest pain, she was unable to void, Foley placed, no further diarrhea  Assessment  & Plan :    Principal Problem:   Colitis Active Problems:   Hypothyroidism   Dyslipidemia   Anxiety and depression   Essential hypertension   COPD with emphysema (HCC)   Malnutrition of moderate degree   Hypokalemia   Abdominal pain   SBO (small bowel obstruction) [facility])   Brief Summary 83 year old who was discharged on 02/22/2018 after treatment for acute hypoxic respiratory failure secondary to COPD exacerbation who readmitted on 02/27/2018 with abdominal pain nausea and vomiting and findings suggestive of colitis.  Patient has a history of chronic constipation typically  C scope 2014: Scattered diverticulosis, flat polyp in the proximal colon EGD 2014: Normal endoscopy    Plan:- 1)Left-sided/Descending and sigmoid colon colitis--- colitis could be infectious, ischemic colitis less likely, continue Zosyn , concerns about possible ileus versus small bowel obstruction repeat abdominal x-rays in a.m. antiemetics and pain medications as ordered, leukocytosis noted however patient has been on steroids recently for COPD   2) acute kidney injury -suspect due to poor oral intake and GI losses , patient's baseline creatinine is typically 0.6 on admission creatinine was up to 1.1 hydrate, avoid nephrotoxic agents  3) COPD/emphysema-continue steroid taper, continue supplemental oxygen,, use incentive spirometry, continue  bronchodilators  4)HTN-it is not clear to me why patient is on Cardizem for BP control despite history of CAD and no history of atrial arrhythmias.  Continue Cardizem  5)Depression and insomnia- - Continue sertraline, trazodone  6)Hypothyroidism- -Continue Synthroid 25 mcg  7)Urinary Retention-after insertion of Foley catheter on 02/28/2018 900 mL of clear urine removed, start Flomax   DVT prophylaxis: Subcutaneous heparin  code Status: Code Disposition Plan: To be determined Consults called: General surgery curbside by the ED physician, recommended medical treatment for partial small bowel obstruction   Lab Results  Component Value Date   PLT 485 (H) 02/28/2018    Inpatient Medications  Scheduled Meds: . albuterol  2.5 mg Nebulization TID  . cholecalciferol  2,000 Units Oral Daily  . diltiazem  240 mg Oral Daily  . heparin  5,000 Units Subcutaneous Q8H  . levothyroxine  25 mcg Oral QAC breakfast  . mometasone-formoterol  2 puff Inhalation BID  . pantoprazole  40 mg Oral Daily  . predniSONE  30 mg Oral Q breakfast   Followed by  . [START ON 03/03/2018] predniSONE  20 mg Oral Q breakfast   Followed by  . [START ON 03/06/2018] predniSONE  10 mg Oral Q breakfast  . sertraline  50 mg Oral Daily  . tamsulosin  0.4 mg Oral QPC supper   Continuous Infusions: . 0.9 % NaCl with KCl 40 mEq / L 75 mL/hr (02/27/18 2026)  . piperacillin-tazobactam 100  mL/hr at 02/27/18 2026  . piperacillin-tazobactam (ZOSYN)  IV 3.375 g (02/28/18 1022)   PRN Meds:.albuterol, benzonatate, bisacodyl, ondansetron **OR** ondansetron (ZOFRAN) IV, polyvinyl alcohol, senna-docusate, traMADol, traZODone    Anti-infectives (From admission, onward)   Start     Dose/Rate Route Frequency Ordered Stop   02/28/18 0200  piperacillin-tazobactam (ZOSYN) IVPB 3.375 g     3.375 g 12.5 mL/hr over 240 Minutes Intravenous Every 8 hours 02/27/18 1753     02/27/18 1745  piperacillin-tazobactam (ZOSYN) IVPB 3.375 g      3.375 g 100 mL/hr over 30 Minutes Intravenous STAT 02/27/18 1735 02/28/18 1745   02/27/18 1645  ciprofloxacin (CIPRO) IVPB 400 mg  Status:  Discontinued     400 mg 200 mL/hr over 60 Minutes Intravenous  Once 02/27/18 1643 02/27/18 1734   02/27/18 1645  metroNIDAZOLE (FLAGYL) IVPB 500 mg  Status:  Discontinued     500 mg 100 mL/hr over 60 Minutes Intravenous  Once 02/27/18 1643 02/27/18 1734        Objective:   Vitals:   02/28/18 0646 02/28/18 0740 02/28/18 1317 02/28/18 1332  BP:    102/65  Pulse:    90  Resp:    17  Temp:    98 F (36.7 C)  TempSrc:    Oral  SpO2:  91% 90% 92%  Weight: 51.3 kg (113 lb 1.5 oz)     Height:        Wt Readings from Last 3 Encounters:  02/28/18 51.3 kg (113 lb 1.5 oz)  02/19/18 47.2 kg (104 lb)  02/19/18 47.4 kg (104 lb 8 oz)     Intake/Output Summary (Last 24 hours) at 02/28/2018 1527 Last data filed at 02/28/2018 1332 Gross per 24 hour  Intake 1700 ml  Output 1500 ml  Net 200 ml     Physical Exam  Gen:- Awake Alert, in no acute distress  HEENT:- [place].AT, No sclera icterus Neck-Supple Neck,No JVD,.  Lungs-  CTAB , fair air movement bilaterally CV- S1, S2 normal Abd-  +ve B.Sounds, Abd Soft, no abdominal tenderness, no rebound or guarding Extremity/Skin:- No  edema,   good pedal pulses Psych-affect is appropriate, mostly oriented, some cognitive concerns Neuro-no new focal deficits, no tremors   Data Review:   Micro Results No results found for this or any previous visit (from the past 240 hour(s)).  Radiology Reports Dg Chest 2 View  Result Date: 02/19/2018 CLINICAL DATA:  Shortness of Breath EXAM: CHEST - 2 VIEW COMPARISON:  None. FINDINGS: The heart size and mediastinal contours are within normal limits. Both lungs are clear. The visualized skeletal structures are unremarkable. IMPRESSION: No active cardiopulmonary disease. Electronically Signed   By: Inez Catalina M.D.   On: 02/19/2018 16:08   Ct Abdomen Pelvis W  Contrast  Result Date: 02/27/2018 CLINICAL DATA:  Generalized abdominal pain with nausea.  Vomiting. EXAM: CT ABDOMEN AND PELVIS WITH CONTRAST TECHNIQUE: Multidetector CT imaging of the abdomen and pelvis was performed using the standard protocol following bolus administration of intravenous contrast. CONTRAST:  38mL ISOVUE-300 IOPAMIDOL (ISOVUE-300) INJECTION 61% COMPARISON:  November 17, 2015 FINDINGS: Lower chest: Mild opacity in the right base dependently is favored to represent atelectasis. Emphysematous changes in the lung bases. Fluid in the distal esophagus. Coronary artery calcifications. Lung bases otherwise normal. Hepatobiliary: Hepatic steatosis. No other acute liver abnormalities identified. The gallbladder is mildly distended but demonstrates no wall thickening, pericholecystic fluid, or stones. The portal vein is patent. Pancreas: Unremarkable. No pancreatic ductal  dilatation or surrounding inflammatory changes. Spleen: Normal in size without focal abnormality. Adrenals/Urinary Tract: The adrenal glands are normal. A few tiny renal cysts are noted. The kidneys are otherwise normal. The ureters and bladder are normal. Stomach/Bowel: The stomach and small bowel are normal. Colonic diverticuli are identified. There is mucosal thickening associated with the colon, most prominent in the descending colon and proximal sigmoid colon. There is also mild wall thickening in the proximal descending colon and proximal sigmoid colon with mild adjacent stranding. There is also stranding in the left pericolic gutter, likely secondary to the colonic process. Proximal to the splenic flexure, the colon is fluid-filled, thin walled, and mildly prominent in caliber measuring up to 6 cm. The appendix is not visualized. No secondary evidence of appendicitis. The terminal ileum is normal. Vascular/Lymphatic: Atherosclerotic changes are seen in the nonaneurysmal aorta, iliac vessels common femoral vessels. No adenopathy.  Reproductive: Status post hysterectomy. No adnexal masses. Other: No abdominal wall hernia or abnormality. No abdominopelvic ascites. Musculoskeletal: There is scalloping of the inferior endplate of L3 which is new since December 2016 but does not appear to be acute. The bones are otherwise normal. IMPRESSION: 1. Mucosal enhancement and mild wall thickening associated with the descending and sigmoid colon is consistent with colitis. This could be infectious, inflammatory, or the ischemic. That being said, the IMA and SMA both enhance normally. Recommend clinical correlation and attention on follow-up. 2. Proximal to the splenic flexure, the colon is borderline in caliber and fluid-filled suggesting ileus or mild relative low-grade obstruction from the more distal colitis. No small bowel dilatation. 3. Mild opacity in the right lung base is favored to represent atelectasis rather than pneumonia. 4. Fluid in the esophagus consistent with reflux. 5. Emphysematous changes in the lung bases. 6. Atherosclerosis in the aorta. 7. Scalloping of the inferior endplate of L3, new since 2016. I suspect this is nonacute based on appearance. Electronically Signed   By: Dorise Bullion III M.D   On: 02/27/2018 15:06     CBC Recent Labs  Lab 02/27/18 1108 02/27/18 2006 02/28/18 0426  WBC 33.0* 36.0* 29.8*  HGB 14.6 13.7 12.4  HCT 45.8 42.1 40.0  PLT 773* 575* 485*  MCV 87.2 84.7 87.5  MCH 27.8 27.6 27.1  MCHC 31.9 32.5 31.0  RDW 16.1* 16.2* 16.5*  LYMPHSABS 1.7  --   --   MONOABS 4.0*  --   --   EOSABS 0.0  --   --   BASOSABS 0.0  --   --     Chemistries  Recent Labs  Lab 02/22/18 0548 02/27/18 1108 02/27/18 2006 02/28/18 0426  NA 139 140  --  136  K 5.0 3.1*  --  4.2  CL 108 98*  --  102  CO2 24 27  --  26  GLUCOSE 138* 123*  --  83  BUN 11 35*  --  24*  CREATININE 0.63 1.16* 1.05* 0.88  CALCIUM 8.5* 8.1*  --  7.2*  AST  --  27  --  17  ALT  --  16  --  10*  ALKPHOS  --  101  --  104    BILITOT  --  0.3  --  1.0   ------------------------------------------------------------------------------------------------------------------ No results for input(s): CHOL, HDL, LDLCALC, TRIG, CHOLHDL, LDLDIRECT in the last 72 hours.  No results found for: HGBA1C ------------------------------------------------------------------------------------------------------------------ No results for input(s): TSH, T4TOTAL, T3FREE, THYROIDAB in the last 72 hours.  Invalid input(s): FREET3 ------------------------------------------------------------------------------------------------------------------ No results  for input(s): VITAMINB12, FOLATE, FERRITIN, TIBC, IRON, RETICCTPCT in the last 72 hours.  Coagulation profile No results for input(s): INR, PROTIME in the last 168 hours.  No results for input(s): DDIMER in the last 72 hours.  Cardiac Enzymes No results for input(s): CKMB, TROPONINI, MYOGLOBIN in the last 168 hours.  Invalid input(s): CK ------------------------------------------------------------------------------------------------------------------    Component Value Date/Time   BNP 128.7 (H) 01/28/2018 1422     Roxan Hockey M.D on 02/28/2018 at 3:27 PM  Between 7am to 7pm - Pager - [phone]  After 7pm go to [email or web address] - password [email or web address]  [facility] -  Office  [phone]   Voice Recognition Viviann Spare dictation system was used to create this note, attempts have been made to correct errors. Please contact the author with questions and/or clarifications.

## 2018-03-01 LAB — BASIC METABOLIC PANEL
Anion gap: 10 (ref 5–15)
BUN: 14 mg/dL (ref 6–20)
CO2: 25 mmol/L (ref 22–32)
Calcium: 7.5 mg/dL — ABNORMAL LOW (ref 8.9–10.3)
Chloride: 101 mmol/L (ref 101–111)
Creatinine, Ser: 0.7 mg/dL (ref 0.44–1.00)
GFR calc Af Amer: >60 mL/min (ref >60)
GFR calc non Af Amer: >60 mL/min (ref >60)
Glucose, Bld: 103 mg/dL — ABNORMAL HIGH (ref 65–99)
Potassium: 3.6 mmol/L (ref 3.5–5.1)
Sodium: 136 mmol/L (ref 135–145)

## 2018-03-01 LAB — CBC
HCT: 35.1 % — ABNORMAL LOW (ref 36.0–46.0)
Hemoglobin: 10.7 g/dL — ABNORMAL LOW (ref 12.0–15.0)
MCH: 26.7 pg (ref 26.0–34.0)
MCHC: 30.5 g/dL (ref 30.0–36.0)
MCV: 87.5 fL (ref 78.0–100.0)
Platelets: 365 10*3/uL (ref 150–400)
RBC: 4.01 MIL/uL (ref 3.87–5.11)
RDW: 16.5 % — ABNORMAL HIGH (ref 11.5–15.5)
WBC: 23.6 10*3/uL — ABNORMAL HIGH (ref 4.0–10.5)

## 2018-03-01 LAB — GLUCOSE, CAPILLARY: Glucose-Capillary: 97 mg/dL (ref 65–99)

## 2018-03-01 MED ORDER — OXYCODONE HCL 5 MG PO TABS
5.0000 mg | ORAL_TABLET | Freq: Four times a day (QID) | ORAL | Status: DC | PRN
  Administered 2018-03-01 – 2018-03-02 (×4): 10 mg via ORAL
  Administered 2018-03-02: 5 mg via ORAL
  Administered 2018-03-02: 13:00:00 10 mg via ORAL
  Administered 2018-03-03: 5 mg via ORAL
  Administered 2018-03-03 (×2): 10 mg via ORAL
  Administered 2018-03-04 (×2): 5 mg via ORAL
  Filled 2018-03-01: qty 1
  Filled 2018-03-01 (×3): qty 2
  Filled 2018-03-01: qty 1
  Filled 2018-03-01 (×5): qty 2
  Filled 2018-03-01: qty 1

## 2018-03-01 MED ORDER — METOPROLOL TARTRATE 12.5 MG HALF TABLET
12.5000 mg | ORAL_TABLET | Freq: Two times a day (BID) | ORAL | Status: DC
  Administered 2018-03-02 – 2018-03-05 (×5): 12.5 mg via ORAL
  Filled 2018-03-01 (×7): qty 1

## 2018-03-01 MED ORDER — MENTHOL 3 MG MT LOZG
1.0000 | LOZENGE | OROMUCOSAL | Status: DC | PRN
  Filled 2018-03-01: qty 9

## 2018-03-01 NOTE — Progress Notes (Signed)
Patient Demographics:    Heather [name], is a 83 y.o. female, DOB - 31-Aug-1935, ONG:[id number]  Admit date - 02/27/2018   Admitting Physician Ankit Arsenio Loader, MD  Outpatient Primary MD for the patient is [name], MD  LOS - 2  Chief Complaint  Patient presents with  . Abdominal Pain  . Constipation        Subjective:    Heather [name] today has no fevers,   No chest pain, patient had emesis after breakfast this morning, no diarrhea  Assessment  & Plan :    Principal Problem:   Colitis Active Problems:   Hypothyroidism   Dyslipidemia   Anxiety and depression   Essential hypertension   COPD with emphysema (HCC)   Malnutrition of moderate degree   Hypokalemia   Abdominal pain   SBO (small bowel obstruction) [facility])   Brief Summary 83 year old who was discharged on 02/22/2018 after treatment for acute hypoxic respiratory failure secondary to COPD exacerbation who readmitted on 02/27/2018 with abdominal pain nausea and vomiting and findings suggestive of colitis.  Patient has a history of chronic constipation typically, on IV Zosyn since 02/27/2018, WBC trending down  C scope 2014: Scattered diverticulosis, flat polyp in the proximal colon EGD 2014: Normal endoscopy   Plan:- 1)Left-sided/Descending and sigmoid colon colitis--- colitis could be infectious, ischemic colitis less likely, continue Zosyn (started 02/27/18), concerns about possible ileus versus small bowel obstruction repeat abdominal x-rays in a.m if emesis abdominal discomfort persist. antiemetics and pain medications as ordered, leukocytosis noted however patient has been on steroids recently for COPD, WBC is down to 23k  from 36K   2)Acute kidney injury -now resolved, suspect due to poor oral intake and GI losses , patient's baseline creatinine is typically 0.6 on admission creatinine was up to 1.1, creatinine is  back down to 0.7 , avoid nephrotoxic agents  3) COPD/emphysema-continue Prednisone taper, continue supplemental oxygen,, use incentive spirometry, continue bronchodilators  4)HTN-it is not clear to me why patient is on Cardizem for BP control despite history of CAD and no history of atrial arrhythmias.  STOP Cardizem, give metoprolol 12.5 mg twice daily  5)Depression and insomnia- - Continue sertraline, trazodone  6)Hypothyroidism- -Continue Synthroid 25 mcg  7)Urinary Retention-after insertion of Foley catheter on 02/28/2018 900 mL of clear urine removed, started Flomax on 02/28/18, consider doing a voiding trial on 03/02/2018   DVT prophylaxis: Subcutaneous heparin  code Status: Code Disposition Plan: To be determined Consults called: General surgery curbside by the ED physician, recommended medical treatment for partial small bowel obstruction   Lab Results  Component Value Date   PLT 365 03/01/2018    Inpatient Medications  Scheduled Meds: . albuterol  2.5 mg Nebulization TID  . cholecalciferol  2,000 Units Oral Daily  . diltiazem  240 mg Oral Daily  . heparin  5,000 Units Subcutaneous Q8H  . levothyroxine  25 mcg Oral QAC breakfast  . mometasone-formoterol  2 puff Inhalation BID  . pantoprazole  40 mg Oral Daily  . predniSONE  30 mg Oral Q breakfast   Followed by  . [START ON 03/03/2018] predniSONE  20 mg Oral Q breakfast   Followed by  . [START ON 03/06/2018] predniSONE  10 mg Oral Q breakfast  .  sertraline  50 mg Oral Daily  . tamsulosin  0.4 mg Oral QPC supper   Continuous Infusions: . piperacillin-tazobactam (ZOSYN)  IV Stopped (03/01/18 1521)   PRN Meds:.albuterol, benzonatate, bisacodyl, menthol-cetylpyridinium, ondansetron **OR** ondansetron (ZOFRAN) IV, oxyCODONE, polyvinyl alcohol, senna-docusate, traMADol, traZODone    Anti-infectives (From admission, onward)   Start     Dose/Rate Route Frequency Ordered Stop   02/28/18 0200  piperacillin-tazobactam  (ZOSYN) IVPB 3.375 g     3.375 g 12.5 mL/hr over 240 Minutes Intravenous Every 8 hours 02/27/18 1753     02/27/18 1745  piperacillin-tazobactam (ZOSYN) IVPB 3.375 g     3.375 g 100 mL/hr over 30 Minutes Intravenous STAT 02/27/18 1735 02/28/18 1745   02/27/18 1645  ciprofloxacin (CIPRO) IVPB 400 mg  Status:  Discontinued     400 mg 200 mL/hr over 60 Minutes Intravenous  Once 02/27/18 1643 02/27/18 1734   02/27/18 1645  metroNIDAZOLE (FLAGYL) IVPB 500 mg  Status:  Discontinued     500 mg 100 mL/hr over 60 Minutes Intravenous  Once 02/27/18 1643 02/27/18 1734        Objective:   Vitals:   03/01/18 0937 03/01/18 1327 03/01/18 1332 03/01/18 1408  BP:  (!) 109/47    Pulse:  82    Resp:      Temp:  98.6 F (37 C)    TempSrc:  Oral    SpO2:  (!) 81% 91% 91%  Weight: 53.1 kg (117 lb 1 oz)     Height:        Wt Readings from Last 3 Encounters:  03/01/18 53.1 kg (117 lb 1 oz)  02/19/18 47.2 kg (104 lb)  02/19/18 47.4 kg (104 lb 8 oz)     Intake/Output Summary (Last 24 hours) at 03/01/2018 1813 Last data filed at 03/01/2018 1739 Gross per 24 hour  Intake 1200 ml  Output 1550 ml  Net -350 ml     Physical Exam  Gen:- Awake Alert, in no acute distress  HEENT:- [place].AT, No sclera icterus Neck-Supple Neck,No JVD,.  Lungs-  CTAB , fair air movement bilaterally CV- S1, S2 normal Abd-  +ve B.Sounds, Abd Soft, no abdominal tenderness, no rebound or guarding Extremity/Skin:- No  edema,   good pedal pulses Psych-affect is appropriate, mostly oriented, some cognitive concerns Neuro-no new focal deficits, no tremors   Data Review:   Micro Results No results found for this or any previous visit (from the past 240 hour(s)).  Radiology Reports Dg Chest 2 View  Result Date: 02/19/2018 CLINICAL DATA:  Shortness of Breath EXAM: CHEST - 2 VIEW COMPARISON:  None. FINDINGS: The heart size and mediastinal contours are within normal limits. Both lungs are clear. The visualized skeletal  structures are unremarkable. IMPRESSION: No active cardiopulmonary disease. Electronically Signed   By: Inez Catalina M.D.   On: 02/19/2018 16:08   Ct Abdomen Pelvis W Contrast  Result Date: 02/27/2018 CLINICAL DATA:  Generalized abdominal pain with nausea.  Vomiting. EXAM: CT ABDOMEN AND PELVIS WITH CONTRAST TECHNIQUE: Multidetector CT imaging of the abdomen and pelvis was performed using the standard protocol following bolus administration of intravenous contrast. CONTRAST:  42mL ISOVUE-300 IOPAMIDOL (ISOVUE-300) INJECTION 61% COMPARISON:  November 17, 2015 FINDINGS: Lower chest: Mild opacity in the right base dependently is favored to represent atelectasis. Emphysematous changes in the lung bases. Fluid in the distal esophagus. Coronary artery calcifications. Lung bases otherwise normal. Hepatobiliary: Hepatic steatosis. No other acute liver abnormalities identified. The gallbladder is mildly distended but demonstrates  no wall thickening, pericholecystic fluid, or stones. The portal vein is patent. Pancreas: Unremarkable. No pancreatic ductal dilatation or surrounding inflammatory changes. Spleen: Normal in size without focal abnormality. Adrenals/Urinary Tract: The adrenal glands are normal. A few tiny renal cysts are noted. The kidneys are otherwise normal. The ureters and bladder are normal. Stomach/Bowel: The stomach and small bowel are normal. Colonic diverticuli are identified. There is mucosal thickening associated with the colon, most prominent in the descending colon and proximal sigmoid colon. There is also mild wall thickening in the proximal descending colon and proximal sigmoid colon with mild adjacent stranding. There is also stranding in the left pericolic gutter, likely secondary to the colonic process. Proximal to the splenic flexure, the colon is fluid-filled, thin walled, and mildly prominent in caliber measuring up to 6 cm. The appendix is not visualized. No secondary evidence of  appendicitis. The terminal ileum is normal. Vascular/Lymphatic: Atherosclerotic changes are seen in the nonaneurysmal aorta, iliac vessels common femoral vessels. No adenopathy. Reproductive: Status post hysterectomy. No adnexal masses. Other: No abdominal wall hernia or abnormality. No abdominopelvic ascites. Musculoskeletal: There is scalloping of the inferior endplate of L3 which is new since December 2016 but does not appear to be acute. The bones are otherwise normal. IMPRESSION: 1. Mucosal enhancement and mild wall thickening associated with the descending and sigmoid colon is consistent with colitis. This could be infectious, inflammatory, or the ischemic. That being said, the IMA and SMA both enhance normally. Recommend clinical correlation and attention on follow-up. 2. Proximal to the splenic flexure, the colon is borderline in caliber and fluid-filled suggesting ileus or mild relative low-grade obstruction from the more distal colitis. No small bowel dilatation. 3. Mild opacity in the right lung base is favored to represent atelectasis rather than pneumonia. 4. Fluid in the esophagus consistent with reflux. 5. Emphysematous changes in the lung bases. 6. Atherosclerosis in the aorta. 7. Scalloping of the inferior endplate of L3, new since 2016. I suspect this is nonacute based on appearance. Electronically Signed   By: Dorise Bullion III M.D   On: 02/27/2018 15:06     CBC Recent Labs  Lab 02/27/18 1108 02/27/18 2006 02/28/18 0426 03/01/18 0510  WBC 33.0* 36.0* 29.8* 23.6*  HGB 14.6 13.7 12.4 10.7*  HCT 45.8 42.1 40.0 35.1*  PLT 773* 575* 485* 365  MCV 87.2 84.7 87.5 87.5  MCH 27.8 27.6 27.1 26.7  MCHC 31.9 32.5 31.0 30.5  RDW 16.1* 16.2* 16.5* 16.5*  LYMPHSABS 1.7  --   --   --   MONOABS 4.0*  --   --   --   EOSABS 0.0  --   --   --   BASOSABS 0.0  --   --   --     Chemistries  Recent Labs  Lab 02/27/18 1108 02/27/18 2006 02/28/18 0426 03/01/18 0510  NA 140  --  136 136  K  3.1*  --  4.2 3.6  CL 98*  --  102 101  CO2 27  --  26 25  GLUCOSE 123*  --  83 103*  BUN 35*  --  24* 14  CREATININE 1.16* 1.05* 0.88 0.70  CALCIUM 8.1*  --  7.2* 7.5*  AST 27  --  17  --   ALT 16  --  10*  --   ALKPHOS 101  --  104  --   BILITOT 0.3  --  1.0  --    ------------------------------------------------------------------------------------------------------------------ No results for input(s):  CHOL, HDL, LDLCALC, TRIG, CHOLHDL, LDLDIRECT in the last 72 hours.  No results found for: HGBA1C ------------------------------------------------------------------------------------------------------------------ No results for input(s): TSH, T4TOTAL, T3FREE, THYROIDAB in the last 72 hours.  Invalid input(s): FREET3 ------------------------------------------------------------------------------------------------------------------ No results for input(s): VITAMINB12, FOLATE, FERRITIN, TIBC, IRON, RETICCTPCT in the last 72 hours.  Coagulation profile No results for input(s): INR, PROTIME in the last 168 hours.  No results for input(s): DDIMER in the last 72 hours.  Cardiac Enzymes No results for input(s): CKMB, TROPONINI, MYOGLOBIN in the last 168 hours.  Invalid input(s): CK ------------------------------------------------------------------------------------------------------------------    Component Value Date/Time   BNP 128.7 (H) 01/28/2018 1422     Roxan Hockey M.D on 03/01/2018 at 6:13 PM  Between 7am to 7pm - Pager - [phone]  After 7pm go to [email or web address] - password [email or web address]  [facility] -  Office  [phone]   Voice Recognition Viviann Spare dictation system was used to create this note, attempts have been made to correct errors. Please contact the author with questions and/or clarifications.

## 2018-03-01 NOTE — Progress Notes (Signed)
This encounter was created in error - please disregard.

## 2018-03-02 ENCOUNTER — Inpatient Hospital Stay (HOSPITAL_COMMUNITY): Payer: Medicare Other

## 2018-03-02 DIAGNOSIS — E860 Dehydration: Secondary | ICD-10-CM

## 2018-03-02 DIAGNOSIS — R1013 Epigastric pain: Secondary | ICD-10-CM

## 2018-03-02 LAB — BASIC METABOLIC PANEL
Anion gap: 10 (ref 5–15)
BUN: 8 mg/dL (ref 6–20)
CO2: 28 mmol/L (ref 22–32)
Calcium: 8.4 mg/dL — ABNORMAL LOW (ref 8.9–10.3)
Chloride: 102 mmol/L (ref 101–111)
Creatinine, Ser: 0.7 mg/dL (ref 0.44–1.00)
GFR calc Af Amer: >60 mL/min (ref >60)
GFR calc non Af Amer: >60 mL/min (ref >60)
Glucose, Bld: 82 mg/dL (ref 65–99)
Potassium: 3.9 mmol/L (ref 3.5–5.1)
Sodium: 140 mmol/L (ref 135–145)

## 2018-03-02 LAB — CBC
HCT: 36.1 % (ref 36.0–46.0)
Hemoglobin: 11.1 g/dL — ABNORMAL LOW (ref 12.0–15.0)
MCH: 26.9 pg (ref 26.0–34.0)
MCHC: 30.7 g/dL (ref 30.0–36.0)
MCV: 87.4 fL (ref 78.0–100.0)
Platelets: 348 10*3/uL (ref 150–400)
RBC: 4.13 MIL/uL (ref 3.87–5.11)
RDW: 16.2 % — ABNORMAL HIGH (ref 11.5–15.5)
WBC: 18.8 10*3/uL — ABNORMAL HIGH (ref 4.0–10.5)

## 2018-03-02 LAB — GLUCOSE, CAPILLARY: Glucose-Capillary: 82 mg/dL (ref 65–99)

## 2018-03-02 MED ORDER — HYDROXYZINE HCL 25 MG PO TABS
25.0000 mg | ORAL_TABLET | ORAL | Status: DC | PRN
Start: 2018-03-02 — End: 2018-03-13
  Administered 2018-03-02 – 2018-03-12 (×5): 25 mg via ORAL
  Filled 2018-03-02 (×5): qty 1

## 2018-03-02 MED ORDER — POLYETHYLENE GLYCOL 3350 17 G PO PACK
17.0000 g | PACK | Freq: Two times a day (BID) | ORAL | Status: DC
  Administered 2018-03-02 – 2018-03-05 (×6): 17 g via ORAL
  Filled 2018-03-02 (×8): qty 1

## 2018-03-02 MED ORDER — BISACODYL 10 MG RE SUPP
10.0000 mg | Freq: Two times a day (BID) | RECTAL | Status: AC
  Administered 2018-03-02 (×2): 10 mg via RECTAL
  Filled 2018-03-02 (×2): qty 1

## 2018-03-02 MED ORDER — DOCUSATE SODIUM 100 MG PO CAPS
100.0000 mg | ORAL_CAPSULE | Freq: Two times a day (BID) | ORAL | Status: DC
  Administered 2018-03-02 – 2018-03-03 (×3): 100 mg via ORAL
  Filled 2018-03-02 (×3): qty 1

## 2018-03-02 MED ORDER — TRAMADOL HCL 50 MG PO TABS
50.0000 mg | ORAL_TABLET | Freq: Two times a day (BID) | ORAL | Status: DC | PRN
  Administered 2018-03-03 (×2): 50 mg via ORAL
  Filled 2018-03-02 (×2): qty 1

## 2018-03-02 NOTE — Progress Notes (Signed)
Pharmacy Antibiotic Note  Heather [name] is a 83 y.o. female admitted on 02/27/2018 with intra-abdominal infection.  Pharmacy has been consulted for Zosyn dosing.  03/02/2018 Scr 0.7, CrCl ~ 42.15mls/min WBC 18.8 afebrile  Plan: Continue Zosyn 3.375g IV Q8H infused over 4hrs. Pharmacy will sign off and follow peripherally   Height: 4\' 11"  (149.9 cm) Weight: 120 lb 9.5 oz (54.7 kg) IBW/kg (Calculated) : 43.2  Temp (24hrs), Avg:98.3 F (36.8 C), Min:98 F (36.7 C), Max:98.6 F (37 C)  Recent Labs  Lab 02/27/18 1108 02/27/18 2006 02/28/18 0426 03/01/18 0510 03/02/18 0645  WBC 33.0* 36.0* 29.8* 23.6* 18.8*  CREATININE 1.16* 1.05* 0.88 0.70 0.70    Estimated Creatinine Clearance: 42.3 mL/min (by C-G formula based on SCr of 0.7 mg/dL).    Allergies  Allergen Reactions  . Tylenol [Acetaminophen] Other (See Comments)    Nightmares  . Symbicort [Budesonide-Formoterol Fumarate] Other (See Comments)    Pt felt like her tongue was swollen, could not swallow  . Duloxetine Other (See Comments)    Patient doesn't recall  . Metronidazole Other (See Comments)    Patient doesn't recall   . Rofecoxib Other (See Comments)    Patient doesn't recall     Antimicrobials this admission: 3/23 >> Zosyn >>  Dose adjustments this admission: --  Microbiology results: None ordered  Thank you for allowing pharmacy to be a part of this patient's care.   Dolly Rias RPh 03/02/2018, 11:56 AM Pager [phone]

## 2018-03-02 NOTE — Evaluation (Signed)
Physical Therapy Evaluation Patient Details Name: Heather [name] MRN: [id number] DOB: 1935-01-16 Today's Date: 03/02/2018   History of Present Illness  83 year old who was discharged on 02/22/2018 after treatment for acute hypoxic respiratory failure secondary to COPD exacerbation who readmitted on 02/27/2018 with abdominal pain nausea and vomiting and findings suggestive of colitis.   Clinical Impression  Pt admitted with above diagnosis. Pt currently with functional limitations due to the deficits listed below (see PT Problem List). Pt ambulated 16' with RW, buckling of LEs noted, distance limited by pain and fatigue.  Pt will benefit from skilled PT during acute stay to increase their independence and safety with mobility to allow discharge to the venue listed below.       Follow Up Recommendations Supervision for mobility/OOB;SNF; HHPT and home health aide if pt refuses SNF    Equipment Recommendations  None recommended by PT    Recommendations for Other Services       Precautions / Restrictions Precautions Precautions: Fall Precaution Comments: 3 falls in past 1 year. monitor O2 sats Restrictions Weight Bearing Restrictions: No      Mobility  Bed Mobility               General bed mobility comments: up in chair  Transfers Overall transfer level: Needs assistance Equipment used: Rolling walker (2 wheeled) Transfers: Sit to/from Stand Sit to Stand: Min assist         General transfer comment: VCs safety, technique, hand placement. Increased time. Pt relied heavily on walker.   Ambulation/Gait Ambulation/Gait assistance: Min guard;Min assist Ambulation Distance (Feet): 60 Feet Assistive device: Rolling walker (2 wheeled) Gait Pattern/deviations: Step-through pattern;Decreased stride length   Gait velocity interpretation: Below normal speed for age/gender General Gait Details: slow gait speed. Intermittent buckling noted. LE unsteadiness/shakiness noted.  Assist to stabilize pt intermittently. She fatigues easily.   Stairs            Wheelchair Mobility    Modified Rankin (Stroke Patients Only)       Balance Overall balance assessment: Mild deficits observed, not formally tested;History of Falls                                           Pertinent Vitals/Pain Pain Assessment: 0-10 Pain Score: 7  Pain Location: lower abdomen Pain Descriptors / Indicators: Aching Pain Intervention(s): Limited activity within patient's tolerance;Monitored during session;Premedicated before session    Home Living Family/patient expects to be discharged to:: Private residence Living Arrangements: Alone Available Help at Discharge: Family;Available PRN/intermittently Type of Home: House Home Access: Stairs to enter   [employer] of Steps: 4 with rail and quad cane Home Layout: One level Home Equipment: Kasandra Knudsen - quad;Walker - 2 wheels;Shower seat;Hand held shower head      Prior Function Level of Independence: Needs assistance   Gait / Transfers Assistance Needed: used quad cane at home (RW doesn't fit well in her space)  ADL's / Homemaking Assistance Needed: aide assists with ADLs  Comments: pt reports son still helps clean and brings her groceries every 2 weeks. she doesnt go out of the house without assist     Hand Dominance        Extremity/Trunk Assessment   Upper Extremity Assessment Upper Extremity Assessment: Generalized weakness    Lower Extremity Assessment Lower Extremity Assessment: Generalized weakness    Cervical / Trunk Assessment Cervical /  Trunk Assessment: Kyphotic  Communication   Communication: No difficulties  Cognition Arousal/Alertness: Awake/alert Behavior During Therapy: WFL for tasks assessed/performed Overall Cognitive Status: Within Functional Limits for tasks assessed                                        General Comments      Exercises      Assessment/Plan    PT Assessment Patient needs continued PT services  PT Problem List Decreased strength;Decreased balance;Decreased mobility;Decreased activity tolerance;Decreased knowledge of use of DME;Decreased safety awareness       PT Treatment Interventions DME instruction;Gait training;Functional mobility training;Therapeutic activities;Balance training;Patient/family education;Therapeutic exercise    PT Goals (Current goals can be found in the Care Plan section)  Acute Rehab PT Goals Patient Stated Goal: to do OK at home  PT Goal Formulation: With patient Time For Goal Achievement: 03/16/18 Potential to Achieve Goals: Fair    Frequency Min 3X/week   Barriers to discharge        Co-evaluation               AM-PAC PT "6 Clicks" Daily Activity  Outcome Measure Difficulty turning over in bed (including adjusting bedclothes, sheets and blankets)?: A Little Difficulty moving from lying on back to sitting on the side of the bed? : A Little Difficulty sitting down on and standing up from a chair with arms (e.g., wheelchair, bedside commode, etc,.)?: A Little Help needed moving to and from a bed to chair (including a wheelchair)?: A Little Help needed walking in hospital room?: A Little Help needed climbing 3-5 steps with a railing? : A Lot 6 Click Score: 17    End of Session Equipment Utilized During Treatment: Gait belt Activity Tolerance: Patient limited by fatigue Patient left: with call bell/phone within reach;in chair Nurse Communication: Mobility status PT Visit Diagnosis: Difficulty in walking, not elsewhere classified (R26.2);Muscle weakness (generalized) (M62.81);Unsteadiness on feet (R26.81);History of falling (Z91.81)    Time: 8088-1103 PT Time Calculation (min) (ACUTE ONLY): 21 min   Charges:   PT Evaluation $PT Eval Low Complexity: 1 Low     PT G Codes:          Philomena Doheny 03/02/2018, 12:54 PM [phone]

## 2018-03-02 NOTE — Progress Notes (Addendum)
Patient Demographics:    Heather [name], is a 83 y.o. female, DOB - 12-02-1935, IOM:[id number]  Admit date - 02/27/2018   Admitting Physician Ankit Arsenio Loader, MD  Outpatient Primary MD for the patient is [name], MD  LOS - 3  Chief Complaint  Patient presents with  . Abdominal Pain  . Constipation        Subjective:    [name] today continues to be nauseous, very uncomfortable and very bloated, has not had a bowel movement since admission. Also complaining of anxiety     Assessment  & Plan :    Principal Problem:   Colitis Active Problems:   Hypothyroidism   Dyslipidemia   Anxiety and depression   Essential hypertension   COPD with emphysema (HCC)   Malnutrition of moderate degree   Hypokalemia   Abdominal pain   SBO (small bowel obstruction) [facility])   Brief Summary 83 year old who was discharged on 02/22/2018 after treatment for acute hypoxic respiratory failure secondary to COPD exacerbation who readmitted on 02/27/2018 with abdominal pain nausea and vomiting and findings suggestive of colitis.  Patient has a history of chronic constipation typically,. Patient also found to have a mild ileus or low-grade bowel obstruction from distal colitis. Patient initiated on IV Zosyn since 02/27/2018, WBC trending down  C scope 2014: Scattered diverticulosis, flat polyp in the proximal colon EGD 2014: Normal endoscopy   Plan:- 1)Left-sided/Descending and sigmoid colon colitis--- colitis could be infectious, ischemic colitis less likely, continue Zosyn (started 02/27/18), concerns about possible ileus versus small bowel obstruction repeat abdominal x-rays , did not show any bowel obstruction or ileus. Mild patient is distention of several small bowel loops, moderate colonic stool burden with no impaction. Patient continues to endorse abdominal discomfort  continue antiemetics  and pain medications as ordered, leukocytosis noted however patient has been on steroids recently for COPD, WBC is down to 18.8 k  from 36K. could transition him to oral antibiotics namely Cipro/Flagyl tomorrow, if able to tolerate orals, change to soft diet. Will also add MiraLAX and Colace, trial of Dulcolax suppository, encouraged ambulation   2)Acute kidney injury -now resolved, suspect due to poor oral intake and GI losses , patient's baseline creatinine is typically 0.6 on admission creatinine was up to 1.1, creatinine is back down to 0.7 , avoid nephrotoxic agents  3) COPD/emphysema-continue Prednisone taper, continue supplemental oxygen,, use incentive spirometry, continue bronchodilators  4)HTN-patient switch from Cardizem to metoprolol 12.5 mg twice daily  5)Depression and insomnia- - Continue sertraline, trazodone  6)Hypothyroidism- -Continue Synthroid 25 mcg  7)Urinary Retention-after insertion of Foley catheter on 02/28/2018 900 mL of clear urine removed, started Flomax on 02/28/18,  Will order to discontinue Foley tomorrow morning and see the patient can urinate  8)anxiety-patient started on when necessary Vistaril   DVT prophylaxis: Subcutaneous heparin  code Status: Code Disposition Plan: has not moved her bowels yet, still has ileus  Consults called: General surgery curbside by the ED physician, recommended medical treatment for partial small bowel obstruction   Lab Results  Component Value Date   PLT 348 03/02/2018    Inpatient Medications  Scheduled Meds: . albuterol  2.5 mg Nebulization TID  . cholecalciferol  2,000 Units Oral Daily  . heparin  5,000 Units Subcutaneous Q8H  . levothyroxine  25 mcg Oral QAC breakfast  . metoprolol tartrate  12.5 mg Oral BID  . mometasone-formoterol  2 puff Inhalation BID  . pantoprazole  40 mg Oral Daily  . [START ON 03/03/2018] predniSONE  20 mg Oral Q breakfast   Followed by  . [START ON 03/06/2018] predniSONE  10 mg  Oral Q breakfast  . sertraline  50 mg Oral Daily  . tamsulosin  0.4 mg Oral QPC supper   Continuous Infusions: . piperacillin-tazobactam (ZOSYN)  IV 3.375 g (03/02/18 1015)   PRN Meds:.albuterol, benzonatate, bisacodyl, menthol-cetylpyridinium, ondansetron **OR** ondansetron (ZOFRAN) IV, oxyCODONE, polyvinyl alcohol, senna-docusate, traMADol, traZODone    Anti-infectives (From admission, onward)   Start     Dose/Rate Route Frequency Ordered Stop   02/28/18 0200  piperacillin-tazobactam (ZOSYN) IVPB 3.375 g     3.375 g 12.5 mL/hr over 240 Minutes Intravenous Every 8 hours 02/27/18 1753     02/27/18 1745  piperacillin-tazobactam (ZOSYN) IVPB 3.375 g     3.375 g 100 mL/hr over 30 Minutes Intravenous STAT 02/27/18 1735 02/28/18 1745   02/27/18 1645  ciprofloxacin (CIPRO) IVPB 400 mg  Status:  Discontinued     400 mg 200 mL/hr over 60 Minutes Intravenous  Once 02/27/18 1643 02/27/18 1734   02/27/18 1645  metroNIDAZOLE (FLAGYL) IVPB 500 mg  Status:  Discontinued     500 mg 100 mL/hr over 60 Minutes Intravenous  Once 02/27/18 1643 02/27/18 1734        Objective:   Vitals:   03/01/18 2151 03/02/18 0500 03/02/18 0516 03/02/18 0719  BP: (!) 136/51  135/62   Pulse: 76  74   Resp:      Temp: 98 F (36.7 C)  98.3 F (36.8 C)   TempSrc: Oral  Oral   SpO2: 99%  97% 91%  Weight:  54.7 kg (120 lb 9.5 oz)    Height:        Wt Readings from Last 3 Encounters:  03/02/18 54.7 kg (120 lb 9.5 oz)  02/19/18 47.2 kg (104 lb)  02/19/18 47.4 kg (104 lb 8 oz)     Intake/Output Summary (Last 24 hours) at 03/02/2018 1053 Last data filed at 03/02/2018 0959 Gross per 24 hour  Intake 850 ml  Output 1800 ml  Net -950 ml     Physical Exam  Gen:- Awake Alert, in no acute distress  HEENT:- [place].AT, No sclera icterus Neck-Supple Neck,No JVD,.  Lungs-  CTAB , fair air movement bilaterally CV- S1, S2 normal Abd- diffusely tender and bloated Extremity/Skin:- No  edema,   good pedal  pulses Psych-affect is appropriate, mostly oriented, some cognitive concerns Neuro-no new focal deficits, no tremors   Data Review:   Micro Results No results found for this or any previous visit (from the past 240 hour(s)).  Radiology Reports Dg Chest 2 View  Result Date: 02/19/2018 CLINICAL DATA:  Shortness of Breath EXAM: CHEST - 2 VIEW COMPARISON:  None. FINDINGS: The heart size and mediastinal contours are within normal limits. Both lungs are clear. The visualized skeletal structures are unremarkable. IMPRESSION: No active cardiopulmonary disease. Electronically Signed   By: Inez Catalina M.D.   On: 02/19/2018 16:08   Ct Abdomen Pelvis W Contrast  Result Date: 02/27/2018 CLINICAL DATA:  Generalized abdominal pain with nausea.  Vomiting. EXAM: CT ABDOMEN AND PELVIS WITH CONTRAST TECHNIQUE: Multidetector CT imaging of the abdomen and pelvis was performed using the standard protocol following bolus administration of intravenous  contrast. CONTRAST:  55mL ISOVUE-300 IOPAMIDOL (ISOVUE-300) INJECTION 61% COMPARISON:  November 17, 2015 FINDINGS: Lower chest: Mild opacity in the right base dependently is favored to represent atelectasis. Emphysematous changes in the lung bases. Fluid in the distal esophagus. Coronary artery calcifications. Lung bases otherwise normal. Hepatobiliary: Hepatic steatosis. No other acute liver abnormalities identified. The gallbladder is mildly distended but demonstrates no wall thickening, pericholecystic fluid, or stones. The portal vein is patent. Pancreas: Unremarkable. No pancreatic ductal dilatation or surrounding inflammatory changes. Spleen: Normal in size without focal abnormality. Adrenals/Urinary Tract: The adrenal glands are normal. A few tiny renal cysts are noted. The kidneys are otherwise normal. The ureters and bladder are normal. Stomach/Bowel: The stomach and small bowel are normal. Colonic diverticuli are identified. There is mucosal thickening associated  with the colon, most prominent in the descending colon and proximal sigmoid colon. There is also mild wall thickening in the proximal descending colon and proximal sigmoid colon with mild adjacent stranding. There is also stranding in the left pericolic gutter, likely secondary to the colonic process. Proximal to the splenic flexure, the colon is fluid-filled, thin walled, and mildly prominent in caliber measuring up to 6 cm. The appendix is not visualized. No secondary evidence of appendicitis. The terminal ileum is normal. Vascular/Lymphatic: Atherosclerotic changes are seen in the nonaneurysmal aorta, iliac vessels common femoral vessels. No adenopathy. Reproductive: Status post hysterectomy. No adnexal masses. Other: No abdominal wall hernia or abnormality. No abdominopelvic ascites. Musculoskeletal: There is scalloping of the inferior endplate of L3 which is new since December 2016 but does not appear to be acute. The bones are otherwise normal. IMPRESSION: 1. Mucosal enhancement and mild wall thickening associated with the descending and sigmoid colon is consistent with colitis. This could be infectious, inflammatory, or the ischemic. That being said, the IMA and SMA both enhance normally. Recommend clinical correlation and attention on follow-up. 2. Proximal to the splenic flexure, the colon is borderline in caliber and fluid-filled suggesting ileus or mild relative low-grade obstruction from the more distal colitis. No small bowel dilatation. 3. Mild opacity in the right lung base is favored to represent atelectasis rather than pneumonia. 4. Fluid in the esophagus consistent with reflux. 5. Emphysematous changes in the lung bases. 6. Atherosclerosis in the aorta. 7. Scalloping of the inferior endplate of L3, new since 2016. I suspect this is nonacute based on appearance. Electronically Signed   By: Dorise Bullion III M.D   On: 02/27/2018 15:06   Dg Abd Acute W/chest  Result Date: 03/02/2018 CLINICAL  DATA:  Nausea and vomiting EXAM: DG ABDOMEN ACUTE W/ 1V CHEST COMPARISON:  Abdominal and pelvic CT scan of February 27, 2018 and chest x-ray of February 19, 2018. FINDINGS: The lungs are adequately inflated. There is a small amount of pleural fluid blunting the right lateral costophrenic angle. There is no alveolar infiltrate. The heart and pulmonary vascularity are normal. There is calcification in the wall of the aortic arch. The bony thorax exhibits no acute abnormality. Within the abdomen the colonic and rectal stool burden is moderate. There are a few loops of mildly distended gas-filled small bowel in the mid and lower abdomen. The bony structures are unremarkable. There are no abnormal soft tissue calcification. IMPRESSION: No bowel obstruction nor ileus. Mild gaseous distention of several small bowel loops may reflect a mild ileus. Moderate colonic stool burden but no fecal impaction. Small right pleural effusion new since the previous study. No focal pneumonia underlying COPD. Thoracic aortic atherosclerosis. Electronically Signed  By: David  Martinique M.D.   On: 03/02/2018 09:09     CBC Recent Labs  Lab 02/27/18 1108 02/27/18 2006 02/28/18 0426 03/01/18 0510 03/02/18 0645  WBC 33.0* 36.0* 29.8* 23.6* 18.8*  HGB 14.6 13.7 12.4 10.7* 11.1*  HCT 45.8 42.1 40.0 35.1* 36.1  PLT 773* 575* 485* 365 348  MCV 87.2 84.7 87.5 87.5 87.4  MCH 27.8 27.6 27.1 26.7 26.9  MCHC 31.9 32.5 31.0 30.5 30.7  RDW 16.1* 16.2* 16.5* 16.5* 16.2*  LYMPHSABS 1.7  --   --   --   --   MONOABS 4.0*  --   --   --   --   EOSABS 0.0  --   --   --   --   BASOSABS 0.0  --   --   --   --     Chemistries  Recent Labs  Lab 02/27/18 1108 02/27/18 2006 02/28/18 0426 03/01/18 0510 03/02/18 0645  NA 140  --  136 136 140  K 3.1*  --  4.2 3.6 3.9  CL 98*  --  102 101 102  CO2 27  --  26 25 28   GLUCOSE 123*  --  83 103* 82  BUN 35*  --  24* 14 8  CREATININE 1.16* 1.05* 0.88 0.70 0.70  CALCIUM 8.1*  --  7.2* 7.5* 8.4*   AST 27  --  17  --   --   ALT 16  --  10*  --   --   ALKPHOS 101  --  104  --   --   BILITOT 0.3  --  1.0  --   --    ------------------------------------------------------------------------------------------------------------------ No results for input(s): CHOL, HDL, LDLCALC, TRIG, CHOLHDL, LDLDIRECT in the last 72 hours.  No results found for: HGBA1C ------------------------------------------------------------------------------------------------------------------ No results for input(s): TSH, T4TOTAL, T3FREE, THYROIDAB in the last 72 hours.  Invalid input(s): FREET3 ------------------------------------------------------------------------------------------------------------------ No results for input(s): VITAMINB12, FOLATE, FERRITIN, TIBC, IRON, RETICCTPCT in the last 72 hours.  Coagulation profile No results for input(s): INR, PROTIME in the last 168 hours.  No results for input(s): DDIMER in the last 72 hours.  Cardiac Enzymes No results for input(s): CKMB, TROPONINI, MYOGLOBIN in the last 168 hours.  Invalid input(s): CK ------------------------------------------------------------------------------------------------------------------    Component Value Date/Time   BNP 128.7 (H) 01/28/2018 1422     Reyne Dumas M.D on 03/02/2018 at 10:53 AM     After 7pm go to [email or web address] - password [email or web address]  [facility] -  Office  [phone]   Voice Recognition Viviann Spare dictation system was used to create this note, attempts have been made to correct errors. Please contact the author with questions and/or clarifications.

## 2018-03-02 NOTE — Consult Note (Signed)
   [facility] CM Inpatient Consult   03/02/2018  Heather [name] 03-27-1935 [id number]     Heather [name] recently active with [place] [facility] program. However, [place] team was unable to reach her. Please see chart review tab then encounter for patient outreach details.   Spoke with Heather [name] at bedside about ongoing [place] [facility] follow up. She is agreeable. States she did receive the messages from [facility] team but was not well or was back in hospital.  Heather [name] is adamant that she is returning home and not go to SNF.   Spoke with inpatient RNCM to make aware [facility] is following. Also discussed patient could benefit from [place] program due to limited support at home.   Will continue to follow and update [facility] team as needed.     [name], MSN-Ed, RN,BSN [facility] Liaison [phone]

## 2018-03-03 ENCOUNTER — Inpatient Hospital Stay: Payer: Medicare Other | Admitting: Family Medicine

## 2018-03-03 DIAGNOSIS — E785 Hyperlipidemia, unspecified: Secondary | ICD-10-CM

## 2018-03-03 LAB — COMPREHENSIVE METABOLIC PANEL
ALT: 10 U/L — ABNORMAL LOW (ref 14–54)
AST: 10 U/L — ABNORMAL LOW (ref 15–41)
Albumin: 2.1 g/dL — ABNORMAL LOW (ref 3.5–5.0)
Alkaline Phosphatase: 96 U/L (ref 38–126)
Anion gap: 11 (ref 5–15)
BUN: 10 mg/dL (ref 6–20)
CO2: 26 mmol/L (ref 22–32)
Calcium: 8.1 mg/dL — ABNORMAL LOW (ref 8.9–10.3)
Chloride: 103 mmol/L (ref 101–111)
Creatinine, Ser: 0.68 mg/dL (ref 0.44–1.00)
GFR calc Af Amer: >60 mL/min (ref >60)
GFR calc non Af Amer: >60 mL/min (ref >60)
Glucose, Bld: 86 mg/dL (ref 65–99)
Potassium: 3.5 mmol/L (ref 3.5–5.1)
Sodium: 140 mmol/L (ref 135–145)
Total Bilirubin: 0.5 mg/dL (ref 0.3–1.2)
Total Protein: 4.9 g/dL — ABNORMAL LOW (ref 6.5–8.1)

## 2018-03-03 LAB — CBC
HCT: 31.2 % — ABNORMAL LOW (ref 36.0–46.0)
Hemoglobin: 9.6 g/dL — ABNORMAL LOW (ref 12.0–15.0)
MCH: 27.3 pg (ref 26.0–34.0)
MCHC: 30.8 g/dL (ref 30.0–36.0)
MCV: 88.6 fL (ref 78.0–100.0)
Platelets: 285 10*3/uL (ref 150–400)
RBC: 3.52 MIL/uL — ABNORMAL LOW (ref 3.87–5.11)
RDW: 16.4 % — ABNORMAL HIGH (ref 11.5–15.5)
WBC: 13.7 10*3/uL — ABNORMAL HIGH (ref 4.0–10.5)

## 2018-03-03 LAB — MAGNESIUM: Magnesium: 2.2 mg/dL (ref 1.7–2.4)

## 2018-03-03 MED ORDER — BISACODYL 10 MG RE SUPP
10.0000 mg | Freq: Once | RECTAL | Status: AC
  Administered 2018-03-03: 10 mg via RECTAL
  Filled 2018-03-03: qty 1

## 2018-03-03 MED ORDER — POTASSIUM CHLORIDE IN NACL 20-0.9 MEQ/L-% IV SOLN
INTRAVENOUS | Status: DC
  Administered 2018-03-03 – 2018-03-06 (×6): via INTRAVENOUS
  Filled 2018-03-03 (×6): qty 1000

## 2018-03-03 NOTE — Care Management Important Message (Signed)
Important Message  Patient Details IM Letter given to Suzanne/Case Manager to present to the Patient Name: Heather [name] MRN: [id number] Date of Birth: Mar 15, 1935   Medicare Important Message Given:  Yes    Kerin Salen 03/03/2018, 10:59 AMImportant Message  Patient Details  Name: Heather [name] MRN: [id number] Date of Birth: 28-May-1935   Medicare Important Message Given:  Yes    Kerin Salen 03/03/2018, 10:59 AM

## 2018-03-03 NOTE — Progress Notes (Signed)
Patient Demographics:    Heather [name], is a 83 y.o. female, DOB - 1935-03-04, JJK:[id number]  Admit date - 02/27/2018   Admitting Physician Ankit Arsenio Loader, MD  Outpatient Primary MD for the patient is [name], MD  LOS - 4  Chief Complaint  Patient presents with  . Abdominal Pain  . Constipation        Subjective:    Heather [name] today continues to be nauseous, she is trying to eat. Son is by the bedside. Patient states that she had a massive large bowel movement today     Assessment  & Plan :    Principal Problem:   Colitis Active Problems:   Hypothyroidism   Dyslipidemia   Anxiety and depression   Essential hypertension   COPD with emphysema (HCC)   Malnutrition of moderate degree   Hypokalemia   Abdominal pain   SBO (small bowel obstruction) (HCC)   Dehydration   Brief Summary 83 year old who was discharged on 02/22/2018 after treatment for acute hypoxic respiratory failure secondary to COPD exacerbation who readmitted on 02/27/2018 with abdominal pain nausea and vomiting and findings suggestive of colitis.  Patient has a history of chronic constipation typically,. Patient also found to have a mild ileus or low-grade bowel obstruction from distal colitis. Patient initiated on IV Zosyn since 02/27/2018, WBC trending down  C scope 2014: Scattered diverticulosis, flat polyp in the proximal colon EGD 2014: Normal endoscopy   Plan:- 1)Left-sided/Descending and sigmoid colon colitis--- colitis could be infectious, ischemic colitis less likely, also concern for C. Difficile colitis given recent admission between 3/15-3/19 for COPD  Exacerbation and treatment with antibiotics. Continue Zosyn (started 02/27/18), concerns about possible ileus versus small bowel obstruction repeat abdominal x-rays ,  Showed mild ileus .  moderate colonic stool burden with no  impaction.patient states that she had a large bowel movement today. Hopefully her ileus is improving.  continue antiemetics and pain medications as ordered, leukocytosis improving as well,  Recently given steroids for COPD exacerbation, WBC is down to 13.7  k  from 36K. could transition him to oral antibiotics namely Cipro/Flagyl  Once nausea resolves. Suspect she will need another one to 2 days before she can be discharged. Given the fact that she had a bowel movement, will send off stool studies, GI panel. Hold off on further laxatives/stool softeners, and monitor frequency of stools.she will need an outpatient colonoscopy and GI follow-up.   2)Acute kidney injury -now resolved, suspect due to poor oral intake and GI losses , patient's baseline creatinine is typically 0.6 on admission creatinine was up to 1.1, creatinine is back down to 0.7 , avoid nephrotoxic agents  3) COPD/emphysema-continue Prednisone taper, continue supplemental oxygen,, use incentive spirometry, continue bronchodilators  4)HTN-patient switch from Cardizem to metoprolol 12.5 mg twice daily  5)Depression and insomnia- - Continue sertraline, trazodone  6)Hypothyroidism- -Continue Synthroid 25 mcg  7)Urinary Retention-after insertion of Foley catheter on 02/28/2018 900 mL of clear urine removed, started Flomax on 02/28/18,  Will order to discontinue Foley   morning and see if the patient can urinate  8)anxiety-patient started on when necessary Vistaril   DVT prophylaxis: Subcutaneous heparin  code Status: Code Disposition Plan:  still nauseous, anticipate discharge in 1-2 days  Consults called: General surgery curbside by the ED physician, recommended medical treatment for partial small bowel obstruction   Lab Results  Component Value Date   PLT 285 03/03/2018    Inpatient Medications  Scheduled Meds: . albuterol  2.5 mg Nebulization TID  . cholecalciferol  2,000 Units Oral Daily  . heparin  5,000 Units  Subcutaneous Q8H  . levothyroxine  25 mcg Oral QAC breakfast  . metoprolol tartrate  12.5 mg Oral BID  . mometasone-formoterol  2 puff Inhalation BID  . pantoprazole  40 mg Oral Daily  . polyethylene glycol  17 g Oral BID  . predniSONE  20 mg Oral Q breakfast   Followed by  . [START ON 03/06/2018] predniSONE  10 mg Oral Q breakfast  . sertraline  50 mg Oral Daily  . tamsulosin  0.4 mg Oral QPC supper   Continuous Infusions: . piperacillin-tazobactam (ZOSYN)  IV Stopped (03/03/18 0526)   PRN Meds:.albuterol, benzonatate, hydrOXYzine, menthol-cetylpyridinium, ondansetron **OR** ondansetron (ZOFRAN) IV, oxyCODONE, polyvinyl alcohol, senna-docusate, traMADol, traZODone    Anti-infectives (From admission, onward)   Start     Dose/Rate Route Frequency Ordered Stop   02/28/18 0200  piperacillin-tazobactam (ZOSYN) IVPB 3.375 g     3.375 g 12.5 mL/hr over 240 Minutes Intravenous Every 8 hours 02/27/18 1753     02/27/18 1745  piperacillin-tazobactam (ZOSYN) IVPB 3.375 g     3.375 g 100 mL/hr over 30 Minutes Intravenous STAT 02/27/18 1735 02/28/18 1745   02/27/18 1645  ciprofloxacin (CIPRO) IVPB 400 mg  Status:  Discontinued     400 mg 200 mL/hr over 60 Minutes Intravenous  Once 02/27/18 1643 02/27/18 1734   02/27/18 1645  metroNIDAZOLE (FLAGYL) IVPB 500 mg  Status:  Discontinued     500 mg 100 mL/hr over 60 Minutes Intravenous  Once 02/27/18 1643 02/27/18 1734        Objective:   Vitals:   03/02/18 2203 03/03/18 0508 03/03/18 0510 03/03/18 0747  BP: 132/60     Pulse: 66 65    Resp: 15 16    Temp:  98.2 F (36.8 C)    TempSrc:  Oral    SpO2:  92%  95%  Weight:   54.3 kg (119 lb 11.4 oz)   Height:        Wt Readings from Last 3 Encounters:  03/03/18 54.3 kg (119 lb 11.4 oz)  02/19/18 47.2 kg (104 lb)  02/19/18 47.4 kg (104 lb 8 oz)     Intake/Output Summary (Last 24 hours) at 03/03/2018 1035 Last data filed at 03/03/2018 0500 Gross per 24 hour  Intake 610 ml  Output 875  ml  Net -265 ml     Physical Exam  Gen:- Awake Alert, in no acute distress  HEENT:- [place].AT, No sclera icterus Neck-Supple Neck,No JVD,.  Lungs-  CTAB , fair air movement bilaterally CV- S1, S2 normal Abd- diffusely tender and bloated Extremity/Skin:- No  edema,   good pedal pulses Psych-affect is appropriate, mostly oriented, some cognitive concerns Neuro-no new focal deficits, no tremors   Data Review:   Micro Results No results found for this or any previous visit (from the past 240 hour(s)).  Radiology Reports Dg Chest 2 View  Result Date: 02/19/2018 CLINICAL DATA:  Shortness of Breath EXAM: CHEST - 2 VIEW COMPARISON:  None. FINDINGS: The heart size and mediastinal contours are within normal limits. Both lungs are clear. The visualized skeletal structures are unremarkable. IMPRESSION: No active cardiopulmonary disease. Electronically Signed  By: Inez Catalina M.D.   On: 02/19/2018 16:08   Ct Abdomen Pelvis W Contrast  Result Date: 02/27/2018 CLINICAL DATA:  Generalized abdominal pain with nausea.  Vomiting. EXAM: CT ABDOMEN AND PELVIS WITH CONTRAST TECHNIQUE: Multidetector CT imaging of the abdomen and pelvis was performed using the standard protocol following bolus administration of intravenous contrast. CONTRAST:  74mL ISOVUE-300 IOPAMIDOL (ISOVUE-300) INJECTION 61% COMPARISON:  November 17, 2015 FINDINGS: Lower chest: Mild opacity in the right base dependently is favored to represent atelectasis. Emphysematous changes in the lung bases. Fluid in the distal esophagus. Coronary artery calcifications. Lung bases otherwise normal. Hepatobiliary: Hepatic steatosis. No other acute liver abnormalities identified. The gallbladder is mildly distended but demonstrates no wall thickening, pericholecystic fluid, or stones. The portal vein is patent. Pancreas: Unremarkable. No pancreatic ductal dilatation or surrounding inflammatory changes. Spleen: Normal in size without focal abnormality.  Adrenals/Urinary Tract: The adrenal glands are normal. A few tiny renal cysts are noted. The kidneys are otherwise normal. The ureters and bladder are normal. Stomach/Bowel: The stomach and small bowel are normal. Colonic diverticuli are identified. There is mucosal thickening associated with the colon, most prominent in the descending colon and proximal sigmoid colon. There is also mild wall thickening in the proximal descending colon and proximal sigmoid colon with mild adjacent stranding. There is also stranding in the left pericolic gutter, likely secondary to the colonic process. Proximal to the splenic flexure, the colon is fluid-filled, thin walled, and mildly prominent in caliber measuring up to 6 cm. The appendix is not visualized. No secondary evidence of appendicitis. The terminal ileum is normal. Vascular/Lymphatic: Atherosclerotic changes are seen in the nonaneurysmal aorta, iliac vessels common femoral vessels. No adenopathy. Reproductive: Status post hysterectomy. No adnexal masses. Other: No abdominal wall hernia or abnormality. No abdominopelvic ascites. Musculoskeletal: There is scalloping of the inferior endplate of L3 which is new since December 2016 but does not appear to be acute. The bones are otherwise normal. IMPRESSION: 1. Mucosal enhancement and mild wall thickening associated with the descending and sigmoid colon is consistent with colitis. This could be infectious, inflammatory, or the ischemic. That being said, the IMA and SMA both enhance normally. Recommend clinical correlation and attention on follow-up. 2. Proximal to the splenic flexure, the colon is borderline in caliber and fluid-filled suggesting ileus or mild relative low-grade obstruction from the more distal colitis. No small bowel dilatation. 3. Mild opacity in the right lung base is favored to represent atelectasis rather than pneumonia. 4. Fluid in the esophagus consistent with reflux. 5. Emphysematous changes in the lung  bases. 6. Atherosclerosis in the aorta. 7. Scalloping of the inferior endplate of L3, new since 2016. I suspect this is nonacute based on appearance. Electronically Signed   By: Dorise Bullion III M.D   On: 02/27/2018 15:06   Dg Abd Acute W/chest  Result Date: 03/02/2018 CLINICAL DATA:  Nausea and vomiting EXAM: DG ABDOMEN ACUTE W/ 1V CHEST COMPARISON:  Abdominal and pelvic CT scan of February 27, 2018 and chest x-ray of February 19, 2018. FINDINGS: The lungs are adequately inflated. There is a small amount of pleural fluid blunting the right lateral costophrenic angle. There is no alveolar infiltrate. The heart and pulmonary vascularity are normal. There is calcification in the wall of the aortic arch. The bony thorax exhibits no acute abnormality. Within the abdomen the colonic and rectal stool burden is moderate. There are a few loops of mildly distended gas-filled small bowel in the mid and lower abdomen.  The bony structures are unremarkable. There are no abnormal soft tissue calcification. IMPRESSION: No bowel obstruction nor ileus. Mild gaseous distention of several small bowel loops may reflect a mild ileus. Moderate colonic stool burden but no fecal impaction. Small right pleural effusion new since the previous study. No focal pneumonia underlying COPD. Thoracic aortic atherosclerosis. Electronically Signed   By: David  Martinique M.D.   On: 03/02/2018 09:09     CBC Recent Labs  Lab 02/27/18 1108 02/27/18 2006 02/28/18 0426 03/01/18 0510 03/02/18 0645 03/03/18 0553  WBC 33.0* 36.0* 29.8* 23.6* 18.8* 13.7*  HGB 14.6 13.7 12.4 10.7* 11.1* 9.6*  HCT 45.8 42.1 40.0 35.1* 36.1 31.2*  PLT 773* 575* 485* 365 348 285  MCV 87.2 84.7 87.5 87.5 87.4 88.6  MCH 27.8 27.6 27.1 26.7 26.9 27.3  MCHC 31.9 32.5 31.0 30.5 30.7 30.8  RDW 16.1* 16.2* 16.5* 16.5* 16.2* 16.4*  LYMPHSABS 1.7  --   --   --   --   --   MONOABS 4.0*  --   --   --   --   --   EOSABS 0.0  --   --   --   --   --   BASOSABS 0.0  --   --    --   --   --     Chemistries  Recent Labs  Lab 02/27/18 1108 02/27/18 2006 02/28/18 0426 03/01/18 0510 03/02/18 0645 03/03/18 0553  NA 140  --  136 136 140 140  K 3.1*  --  4.2 3.6 3.9 3.5  CL 98*  --  102 101 102 103  CO2 27  --  26 25 28 26   GLUCOSE 123*  --  83 103* 82 86  BUN 35*  --  24* 14 8 10   CREATININE 1.16* 1.05* 0.88 0.70 0.70 0.68  CALCIUM 8.1*  --  7.2* 7.5* 8.4* 8.1*  MG  --   --   --   --   --  2.2  AST 27  --  17  --   --  10*  ALT 16  --  10*  --   --  10*  ALKPHOS 101  --  104  --   --  96  BILITOT 0.3  --  1.0  --   --  0.5   ------------------------------------------------------------------------------------------------------------------ No results for input(s): CHOL, HDL, LDLCALC, TRIG, CHOLHDL, LDLDIRECT in the last 72 hours.  No results found for: HGBA1C ------------------------------------------------------------------------------------------------------------------ No results for input(s): TSH, T4TOTAL, T3FREE, THYROIDAB in the last 72 hours.  Invalid input(s): FREET3 ------------------------------------------------------------------------------------------------------------------ No results for input(s): VITAMINB12, FOLATE, FERRITIN, TIBC, IRON, RETICCTPCT in the last 72 hours.  Coagulation profile No results for input(s): INR, PROTIME in the last 168 hours.  No results for input(s): DDIMER in the last 72 hours.  Cardiac Enzymes No results for input(s): CKMB, TROPONINI, MYOGLOBIN in the last 168 hours.  Invalid input(s): CK ------------------------------------------------------------------------------------------------------------------    Component Value Date/Time   BNP 128.7 (H) 01/28/2018 1422     Reyne Dumas M.D on 03/03/2018 at 10:35 AM     After 7pm go to [email or web address] - password [email or web address]  [facility] -  Office  [phone]   Voice Recognition Viviann Spare dictation system was used to create this note, attempts have been  made to correct errors. Please contact the author with questions and/or clarifications.

## 2018-03-03 NOTE — Progress Notes (Signed)
Physical Therapy Treatment Patient Details Name: Heather [name] MRN: [id number] DOB: 1934-12-26 Today's Date: 03/03/2018    History of Present Illness 83 year old who was discharged on 02/22/2018 after treatment for acute hypoxic respiratory failure secondary to COPD exacerbation who readmitted on 02/27/2018 with abdominal pain nausea and vomiting and findings suggestive of colitis.     PT Comments    Pt agreeable to mobilize and requested using bathroom first.  Pt only able to tolerate ambulating to/from bathroom due to abdominal pain and fatigue.  RN notified.   Follow Up Recommendations  Supervision for mobility/OOB;SNF     Equipment Recommendations  None recommended by PT    Recommendations for Other Services       Precautions / Restrictions Precautions Precautions: Fall    Mobility  Bed Mobility Overal bed mobility: Needs Assistance Bed Mobility: Supine to Sit;Sit to Supine     Supine to sit: Supervision;HOB elevated Sit to supine: Supervision;HOB elevated   General bed mobility comments: increased time  Transfers Overall transfer level: Needs assistance Equipment used: Rolling walker (2 wheeled) Transfers: Sit to/from Stand Sit to Stand: Min guard         General transfer comment: verbal cues for safe technique  Ambulation/Gait Ambulation/Gait assistance: Min guard Ambulation Distance (Feet): 15 Feet Assistive device: Rolling walker (2 wheeled) Gait Pattern/deviations: Step-through pattern;Decreased stride length     General Gait Details: pt requested to use bathroom prior to ambulating and only able to tolerate ambulating to/from bathroom, shaky LEs with washing hands and requested back to bed   Stairs            Wheelchair Mobility    Modified Rankin (Stroke Patients Only)       Balance                                            Cognition Arousal/Alertness: Awake/alert Behavior During Therapy: WFL for tasks  assessed/performed Overall Cognitive Status: Within Functional Limits for tasks assessed                                        Exercises      General Comments        Pertinent Vitals/Pain Pain Assessment: 0-10 Pain Score: 8  Pain Location: lower abdomen Pain Descriptors / Indicators: Aching Pain Intervention(s): Repositioned;Limited activity within patient's tolerance;Monitored during session    Home Living                      Prior Function            PT Goals (current goals can now be found in the care plan section) Progress towards PT goals: Progressing toward goals    Frequency    Min 3X/week      PT Plan Current plan remains appropriate    Co-evaluation              AM-PAC PT "6 Clicks" Daily Activity  Outcome Measure  Difficulty turning over in bed (including adjusting bedclothes, sheets and blankets)?: A Little Difficulty moving from lying on back to sitting on the side of the bed? : A Little Difficulty sitting down on and standing up from a chair with arms (e.g., wheelchair, bedside commode, etc,.)?: A Little Help needed moving to and  from a bed to chair (including a wheelchair)?: A Little Help needed walking in hospital room?: A Little Help needed climbing 3-5 steps with a railing? : A Lot 6 Click Score: 17    End of Session   Activity Tolerance: Patient limited by fatigue Patient left: with call bell/phone within reach;in bed;with bed alarm set Nurse Communication: Mobility status PT Visit Diagnosis: Difficulty in walking, not elsewhere classified (R26.2);Muscle weakness (generalized) (M62.81);Unsteadiness on feet (R26.81)     Time: 4920-1007 PT Time Calculation (min) (ACUTE ONLY): 21 min  Charges:  $Gait Training: 8-22 mins                    G Codes:      Carmelia Bake, PT, DPT 03/03/2018 Pager: [phone]  York Ram E 03/03/2018, 2:02 PM

## 2018-03-03 NOTE — Progress Notes (Signed)
Patient has been up to the [facility] several times this shift. Passing large amounts of flatus but no stoool. Small amounts of mucous passed. Patient reports abdominal pain decreases when passing gas. Will cont to monitor.

## 2018-03-03 NOTE — Progress Notes (Signed)
Patient recommended for SNF but she declined. With recent admissions, discussed with [facility]. Referral made for [facility] program, patient agreeable. HH orders placed, Alvis Lemmings will follow for d/c needs. [phone]

## 2018-03-04 ENCOUNTER — Inpatient Hospital Stay (HOSPITAL_COMMUNITY): Payer: Medicare Other

## 2018-03-04 ENCOUNTER — Encounter (HOSPITAL_COMMUNITY): Payer: Self-pay | Admitting: Radiology

## 2018-03-04 DIAGNOSIS — J439 Emphysema, unspecified: Secondary | ICD-10-CM

## 2018-03-04 DIAGNOSIS — R112 Nausea with vomiting, unspecified: Secondary | ICD-10-CM

## 2018-03-04 LAB — BASIC METABOLIC PANEL
Anion gap: 11 (ref 5–15)
BUN: 8 mg/dL (ref 6–20)
CO2: 25 mmol/L (ref 22–32)
Calcium: 7.7 mg/dL — ABNORMAL LOW (ref 8.9–10.3)
Chloride: 103 mmol/L (ref 101–111)
Creatinine, Ser: 0.68 mg/dL (ref 0.44–1.00)
GFR calc Af Amer: >60 mL/min (ref >60)
GFR calc non Af Amer: >60 mL/min (ref >60)
Glucose, Bld: 87 mg/dL (ref 65–99)
Potassium: 3.4 mmol/L — ABNORMAL LOW (ref 3.5–5.1)
Sodium: 139 mmol/L (ref 135–145)

## 2018-03-04 LAB — GLUCOSE, CAPILLARY
Glucose-Capillary: 152 mg/dL — ABNORMAL HIGH (ref 65–99)
Glucose-Capillary: 96 mg/dL (ref 65–99)

## 2018-03-04 MED ORDER — POTASSIUM CHLORIDE 10 MEQ/100ML IV SOLN
10.0000 meq | INTRAVENOUS | Status: AC
  Administered 2018-03-04 (×3): 10 meq via INTRAVENOUS
  Filled 2018-03-04 (×3): qty 100

## 2018-03-04 MED ORDER — IOPAMIDOL (ISOVUE-300) INJECTION 61%
INTRAVENOUS | Status: AC
  Administered 2018-03-04: 100 mL
  Filled 2018-03-04: qty 100

## 2018-03-04 MED ORDER — PROCHLORPERAZINE EDISYLATE 5 MG/ML IJ SOLN
5.0000 mg | Freq: Once | INTRAMUSCULAR | Status: AC
  Administered 2018-03-04: 04:00:00 5 mg via INTRAVENOUS
  Filled 2018-03-04: qty 2

## 2018-03-04 MED ORDER — MORPHINE SULFATE (PF) 2 MG/ML IV SOLN
2.0000 mg | INTRAVENOUS | Status: DC | PRN
  Administered 2018-03-04 – 2018-03-05 (×4): 2 mg via INTRAVENOUS
  Filled 2018-03-04 (×4): qty 1

## 2018-03-04 MED ORDER — IOPAMIDOL (ISOVUE-300) INJECTION 61%
INTRAVENOUS | Status: AC
  Administered 2018-03-04: 15:00:00 15 mL
  Filled 2018-03-04: qty 30

## 2018-03-04 MED ORDER — METRONIDAZOLE IN NACL 5-0.79 MG/ML-% IV SOLN
500.0000 mg | Freq: Three times a day (TID) | INTRAVENOUS | Status: DC
  Administered 2018-03-04 – 2018-03-13 (×27): 500 mg via INTRAVENOUS
  Filled 2018-03-04 (×27): qty 100

## 2018-03-04 MED ORDER — MORPHINE SULFATE (PF) 2 MG/ML IV SOLN
2.0000 mg | INTRAVENOUS | Status: DC | PRN
  Administered 2018-03-04 (×5): 2 mg via INTRAVENOUS
  Filled 2018-03-04 (×5): qty 1

## 2018-03-04 MED ORDER — CIPROFLOXACIN IN D5W 400 MG/200ML IV SOLN
400.0000 mg | Freq: Two times a day (BID) | INTRAVENOUS | Status: DC
  Administered 2018-03-04 – 2018-03-13 (×19): 400 mg via INTRAVENOUS
  Filled 2018-03-04 (×20): qty 200

## 2018-03-04 MED ORDER — IOPAMIDOL (ISOVUE-300) INJECTION 61%: 30.0000 mL | Freq: Once | INTRAVENOUS | Status: DC

## 2018-03-04 MED ORDER — DEXTROSE 50 % IV SOLN
25.0000 mL | INTRAVENOUS | Status: DC | PRN
  Administered 2018-03-04 – 2018-03-07 (×3): 25 mL via INTRAVENOUS
  Filled 2018-03-04 (×3): qty 50

## 2018-03-04 NOTE — Progress Notes (Addendum)
Hypoglycemic Event  CBG: 68  Treatment: 89mL D50  Symptoms: None  Follow-up CBG: Time:1730 CBG Result:152  Possible Reasons for Event: Patient NPO  Comments/MD notified:Yes.     Fredderick Erb

## 2018-03-04 NOTE — Progress Notes (Signed)
Patient Demographics:    Heather [name], is a 83 y.o. female, DOB - 1935/08/10, DSK:[id number]  Admit date - 02/27/2018   Admitting Physician Ankit Arsenio Loader, MD  Outpatient Primary MD for the patient is [name], MD  LOS - 5  Chief Complaint  Patient presents with  . Abdominal Pain  . Constipation        Subjective:    [name] today continues to have nausea with vomiting. Also c/o abdominal distention, pain.     Assessment  & Plan :    Principal Problem:   Colitis Active Problems:   Hypothyroidism   Dyslipidemia   Anxiety and depression   Essential hypertension   COPD with emphysema (HCC)   Malnutrition of moderate degree   Hypokalemia   Abdominal pain   SBO (small bowel obstruction) (HCC)   Dehydration   Brief Summary 83 year old who was discharged on 02/22/2018 after treatment for acute hypoxic respiratory failure secondary to COPD exacerbation who readmitted on 02/27/2018 with abdominal pain nausea and vomiting and findings suggestive of colitis.  Patient has a history of chronic constipation typically,. Patient also found to have a mild ileus or low-grade bowel obstruction from distal colitis. Patient initiated on IV Zosyn since 02/27/2018, WBC trending down  C scope 2014: Scattered diverticulosis, flat polyp in the proximal colon EGD 2014: Normal endoscopy   Plan:- 1)Left-sided/Descending and sigmoid colon colitis--- colitis could be infectious, ischemic colitis less likely, also concern for C. Difficile colitis given recent admission between 3/15-3/19 for COPD  Exacerbation and treatment with antibiotics.  - On Zosyn (started 02/27/18), concerns about possible ileus versus small bowel obstruction repeat abdominal x-rays ,  Showed mild ileus .  moderate colonic stool burden with no impaction. -Previously it was mentioned that the patient had a large BM.   However, I discussed with the nurse and with the patient today and the patient denies having a bowel movement.  - She is complaining of worsening abdominal distention, nausea with vomiting.  She is unable to tolerate anything orally.  - Will make her NPO. - Close Monitoring of Accu-Cheks while NPO. - Consulted GI. - If abdominal not improving she may need another imaging. - On treatment with IV Zosyn.   - WBC count improving.  However, she is still having abdominal pain.  -If persistent then will consider switching to Cipro/Flagyl (Patient allergic to flagyl? But doesn't recall reactiion)  -Stool studies ordered but the patient did not have a bowel movement.     2)Acute kidney injury -now resolved, suspect due to poor oral intake and GI losses , patient's baseline creatinine is typically 0.6 on admission creatinine was up to 1.1, creatinine is back down to 0.68, avoid nephrotoxic agents  3) COPD/emphysema-continue Prednisone taper, continue supplemental oxygen,, use incentive spirometry, continue bronchodilators  4)HTN-patient switched from Cardizem to metoprolol 12.5 mg twice daily  5)Depression and insomnia- - Continue sertraline, trazodone  6)Hypothyroidism- -Continue Synthroid 25 mcg  7)Urinary Retention-after insertion of Foley catheter on 02/28/2018 900 mL of clear urine removed, started Flomax on 02/28/18. Because  of worsening abdominal pain, abdominal distention, nausea with vomiting will keep the Foley catheter for now to monitor intake/output.  8)anxiety-patient started on when necessary Vistaril   DVT prophylaxis:  Subcutaneous heparin  code Status: Code Disposition Plan:  still nauseous. 2 -3 days  Consults called: General surgery curbside by the ED physician, recommended medical treatment for partial small bowel obstruction. 03/04/18: Gastroenterology consulted.   Lab Results  Component Value Date   PLT 285 03/03/2018    Inpatient Medications  Scheduled  Meds: . albuterol  2.5 mg Nebulization TID  . cholecalciferol  2,000 Units Oral Daily  . heparin  5,000 Units Subcutaneous Q8H  . levothyroxine  25 mcg Oral QAC breakfast  . metoprolol tartrate  12.5 mg Oral BID  . mometasone-formoterol  2 puff Inhalation BID  . pantoprazole  40 mg Oral Daily  . polyethylene glycol  17 g Oral BID  . predniSONE  20 mg Oral Q breakfast   Followed by  . [START ON 03/06/2018] predniSONE  10 mg Oral Q breakfast  . sertraline  50 mg Oral Daily  . tamsulosin  0.4 mg Oral QPC supper   Continuous Infusions: . 0.9 % NaCl with KCl 20 mEq / L 75 mL/hr at 03/04/18 9485  . piperacillin-tazobactam (ZOSYN)  IV 3.375 g (03/04/18 0956)   PRN Meds:.albuterol, benzonatate, hydrOXYzine, menthol-cetylpyridinium, morphine injection, ondansetron **OR** ondansetron (ZOFRAN) IV, oxyCODONE, polyvinyl alcohol, senna-docusate, traMADol, traZODone    Anti-infectives (From admission, onward)   Start     Dose/Rate Route Frequency Ordered Stop   02/28/18 0200  piperacillin-tazobactam (ZOSYN) IVPB 3.375 g     3.375 g 12.5 mL/hr over 240 Minutes Intravenous Every 8 hours 02/27/18 1753     02/27/18 1745  piperacillin-tazobactam (ZOSYN) IVPB 3.375 g     3.375 g 100 mL/hr over 30 Minutes Intravenous STAT 02/27/18 1735 02/28/18 1745   02/27/18 1645  ciprofloxacin (CIPRO) IVPB 400 mg  Status:  Discontinued     400 mg 200 mL/hr over 60 Minutes Intravenous  Once 02/27/18 1643 02/27/18 1734   02/27/18 1645  metroNIDAZOLE (FLAGYL) IVPB 500 mg  Status:  Discontinued     500 mg 100 mL/hr over 60 Minutes Intravenous  Once 02/27/18 1643 02/27/18 1734        Objective:   Vitals:   03/03/18 2122 03/04/18 0500 03/04/18 0504 03/04/18 0837  BP: (!) 179/54  (!) 168/70   Pulse: 70  68   Resp: 18  18   Temp: 98.2 F (36.8 C)  98.8 F (37.1 C)   TempSrc: Oral  Oral   SpO2: 96%  92% 94%  Weight:  53.9 kg (118 lb 13.3 oz)    Height:        Wt Readings from Last 3 Encounters:  03/04/18  53.9 kg (118 lb 13.3 oz)  02/19/18 47.2 kg (104 lb)  02/19/18 47.4 kg (104 lb 8 oz)     Intake/Output Summary (Last 24 hours) at 03/04/2018 1137 Last data filed at 03/04/2018 0505 Gross per 24 hour  Intake 497.5 ml  Output 1000 ml  Net -502.5 ml     Physical Exam  Gen:- Awake Alert, mild - moderate distress  HEENT:- [place].AT, No sclera icterus Neck-Supple Neck,No JVD,.  Lungs-  CTAB , fair air movement bilaterally CV- S1, S2 normal Abd- distended, diffusely tender and bloated Extremity/Skin:- No  edema,   good pedal pulses Psych-affect is appropriate, mostly oriented, some cognitive concerns Neuro-no new focal deficits, no tremors   Data Review:   Micro Results No results found for this or any previous visit (from the past 240 hour(s)).  Radiology Reports Dg Chest 2 View  Result Date: 02/19/2018 CLINICAL  DATA:  Shortness of Breath EXAM: CHEST - 2 VIEW COMPARISON:  None. FINDINGS: The heart size and mediastinal contours are within normal limits. Both lungs are clear. The visualized skeletal structures are unremarkable. IMPRESSION: No active cardiopulmonary disease. Electronically Signed   By: Inez Catalina M.D.   On: 02/19/2018 16:08   Ct Abdomen Pelvis W Contrast  Result Date: 02/27/2018 CLINICAL DATA:  Generalized abdominal pain with nausea.  Vomiting. EXAM: CT ABDOMEN AND PELVIS WITH CONTRAST TECHNIQUE: Multidetector CT imaging of the abdomen and pelvis was performed using the standard protocol following bolus administration of intravenous contrast. CONTRAST:  37mL ISOVUE-300 IOPAMIDOL (ISOVUE-300) INJECTION 61% COMPARISON:  November 17, 2015 FINDINGS: Lower chest: Mild opacity in the right base dependently is favored to represent atelectasis. Emphysematous changes in the lung bases. Fluid in the distal esophagus. Coronary artery calcifications. Lung bases otherwise normal. Hepatobiliary: Hepatic steatosis. No other acute liver abnormalities identified. The gallbladder is mildly  distended but demonstrates no wall thickening, pericholecystic fluid, or stones. The portal vein is patent. Pancreas: Unremarkable. No pancreatic ductal dilatation or surrounding inflammatory changes. Spleen: Normal in size without focal abnormality. Adrenals/Urinary Tract: The adrenal glands are normal. A few tiny renal cysts are noted. The kidneys are otherwise normal. The ureters and bladder are normal. Stomach/Bowel: The stomach and small bowel are normal. Colonic diverticuli are identified. There is mucosal thickening associated with the colon, most prominent in the descending colon and proximal sigmoid colon. There is also mild wall thickening in the proximal descending colon and proximal sigmoid colon with mild adjacent stranding. There is also stranding in the left pericolic gutter, likely secondary to the colonic process. Proximal to the splenic flexure, the colon is fluid-filled, thin walled, and mildly prominent in caliber measuring up to 6 cm. The appendix is not visualized. No secondary evidence of appendicitis. The terminal ileum is normal. Vascular/Lymphatic: Atherosclerotic changes are seen in the nonaneurysmal aorta, iliac vessels common femoral vessels. No adenopathy. Reproductive: Status post hysterectomy. No adnexal masses. Other: No abdominal wall hernia or abnormality. No abdominopelvic ascites. Musculoskeletal: There is scalloping of the inferior endplate of L3 which is new since December 2016 but does not appear to be acute. The bones are otherwise normal. IMPRESSION: 1. Mucosal enhancement and mild wall thickening associated with the descending and sigmoid colon is consistent with colitis. This could be infectious, inflammatory, or the ischemic. That being said, the IMA and SMA both enhance normally. Recommend clinical correlation and attention on follow-up. 2. Proximal to the splenic flexure, the colon is borderline in caliber and fluid-filled suggesting ileus or mild relative low-grade  obstruction from the more distal colitis. No small bowel dilatation. 3. Mild opacity in the right lung base is favored to represent atelectasis rather than pneumonia. 4. Fluid in the esophagus consistent with reflux. 5. Emphysematous changes in the lung bases. 6. Atherosclerosis in the aorta. 7. Scalloping of the inferior endplate of L3, new since 2016. I suspect this is nonacute based on appearance. Electronically Signed   By: Dorise Bullion III M.D   On: 02/27/2018 15:06   Dg Abd Acute W/chest  Result Date: 03/02/2018 CLINICAL DATA:  Nausea and vomiting EXAM: DG ABDOMEN ACUTE W/ 1V CHEST COMPARISON:  Abdominal and pelvic CT scan of February 27, 2018 and chest x-ray of February 19, 2018. FINDINGS: The lungs are adequately inflated. There is a small amount of pleural fluid blunting the right lateral costophrenic angle. There is no alveolar infiltrate. The heart and pulmonary vascularity are normal. There is  calcification in the wall of the aortic arch. The bony thorax exhibits no acute abnormality. Within the abdomen the colonic and rectal stool burden is moderate. There are a few loops of mildly distended gas-filled small bowel in the mid and lower abdomen. The bony structures are unremarkable. There are no abnormal soft tissue calcification. IMPRESSION: No bowel obstruction nor ileus. Mild gaseous distention of several small bowel loops may reflect a mild ileus. Moderate colonic stool burden but no fecal impaction. Small right pleural effusion new since the previous study. No focal pneumonia underlying COPD. Thoracic aortic atherosclerosis. Electronically Signed   By: David  Martinique M.D.   On: 03/02/2018 09:09     CBC Recent Labs  Lab 02/27/18 1108 02/27/18 2006 02/28/18 0426 03/01/18 0510 03/02/18 0645 03/03/18 0553  WBC 33.0* 36.0* 29.8* 23.6* 18.8* 13.7*  HGB 14.6 13.7 12.4 10.7* 11.1* 9.6*  HCT 45.8 42.1 40.0 35.1* 36.1 31.2*  PLT 773* 575* 485* 365 348 285  MCV 87.2 84.7 87.5 87.5 87.4 88.6    MCH 27.8 27.6 27.1 26.7 26.9 27.3  MCHC 31.9 32.5 31.0 30.5 30.7 30.8  RDW 16.1* 16.2* 16.5* 16.5* 16.2* 16.4*  LYMPHSABS 1.7  --   --   --   --   --   MONOABS 4.0*  --   --   --   --   --   EOSABS 0.0  --   --   --   --   --   BASOSABS 0.0  --   --   --   --   --     Chemistries  Recent Labs  Lab 02/27/18 1108  02/28/18 0426 03/01/18 0510 03/02/18 0645 03/03/18 0553 03/04/18 0529  NA 140  --  136 136 140 140 139  K 3.1*  --  4.2 3.6 3.9 3.5 3.4*  CL 98*  --  102 101 102 103 103  CO2 27  --  26 25 28 26 25   GLUCOSE 123*  --  83 103* 82 86 87  BUN 35*  --  24* 14 8 10 8   CREATININE 1.16*   < > 0.88 0.70 0.70 0.68 0.68  CALCIUM 8.1*  --  7.2* 7.5* 8.4* 8.1* 7.7*  MG  --   --   --   --   --  2.2  --   AST 27  --  17  --   --  10*  --   ALT 16  --  10*  --   --  10*  --   ALKPHOS 101  --  104  --   --  96  --   BILITOT 0.3  --  1.0  --   --  0.5  --    < > = values in this interval not displayed.   ------------------------------------------------------------------------------------------------------------------ No results for input(s): CHOL, HDL, LDLCALC, TRIG, CHOLHDL, LDLDIRECT in the last 72 hours.  No results found for: HGBA1C ------------------------------------------------------------------------------------------------------------------ No results for input(s): TSH, T4TOTAL, T3FREE, THYROIDAB in the last 72 hours.  Invalid input(s): FREET3 ------------------------------------------------------------------------------------------------------------------ No results for input(s): VITAMINB12, FOLATE, FERRITIN, TIBC, IRON, RETICCTPCT in the last 72 hours.  Coagulation profile No results for input(s): INR, PROTIME in the last 168 hours.  No results for input(s): DDIMER in the last 72 hours.  Cardiac Enzymes No results for input(s): CKMB, TROPONINI, MYOGLOBIN in the last 168 hours.  Invalid input(s):  CK ------------------------------------------------------------------------------------------------------------------    Component Value Date/Time   BNP 128.7 (H) 01/28/2018 1422  Shaley Leavens M.D on 03/04/2018 at 11:37 AM     After 7pm go to [email or web address] - password [email or web address]  [facility] -  Office  [phone]   Voice Recognition Viviann Spare dictation system was used to create this note, attempts have been made to correct errors. Please contact the author with questions and/or clarifications.

## 2018-03-04 NOTE — Consult Note (Addendum)
Referring Provider:  Dr.Matcha Primary Care Physician:  [name], MD Primary Gastroenterologist:  Dr. [name]   Reason for Consultation:  Nausea, vomiting , Abdominal pain, colitis  HPI: Heather [name] is a 83 y.o. female past medical history of anxiety, depression, hypothyroidism admitted to the hospital on 02/27/2018 for evaluation of abdominal pain. CT scan on initial evaluation showed mild wall thickening of descending and sigmoid colon consistent with colitis. Patient continues to have nausea and vomiting and worsening abdominal pain. GI is consulted for further evaluation.  Patient seen and examined at bedside. She is complaining of worsening abdominal pain for last few days as well as worsening abdominal distention. Complaining of ongoing nausea and nonbloody vomiting. No bowel movement in last 5 days. Not passing gas.  EGD and colonoscopy in December 2014 by Dr. [name]. EGD was normal. Biopsy was positive for intestinal metaplasia of gastric mucosa.  Random colon biopsy was negative for microscopic colitis. Small hyperplastic polyp was noted. Past Medical History:  Diagnosis Date  . ANXIETY 07/23/2009  . Atherosclerosis of aorta ([place]) 07/24/2015  . Complication of anesthesia 7 or 8 yrs ago   woke up during colonscopy  . Coronary atherosclerosis 07/24/2015  . DEPRESSION 07/23/2009  . DIVERTICULOSIS, COLON 07/23/2009  . GERD 07/23/2009  . Headache(784.0) 07/23/2009   occasional  . Hemorrhoids   . HIP PAIN, LEFT 05/16/2010  . History of Crohn's disease   . HYPERLIPIDEMIA 07/23/2009  . HYPERTENSION 07/23/2009  . HYPOTHYROIDISM 07/23/2009  . IBS (irritable bowel syndrome)   . Insomnia   . LOW BACK PAIN, CHRONIC 10/01/2009  . PARESTHESIA 10/01/2009  . Shingles 2006   back    Past Surgical History:  Procedure Laterality Date  . ABDOMINAL HYSTERECTOMY  age 42 or 75  . APPENDECTOMY  yrs ago  . BILATERAL SALPINGOOPHORECTOMY  age 41 or 30  . COLONOSCOPY N/A 11/17/2013   Procedure: COLONOSCOPY;  Surgeon: Cleotis Nipper, MD;  Location: [facility];  Service: Endoscopy;  Laterality: N/A;  . ESOPHAGOGASTRODUODENOSCOPY N/A 11/17/2013   Procedure: ESOPHAGOGASTRODUODENOSCOPY (EGD);  Surgeon: Cleotis Nipper, MD;  Location: [facility];  Service: Endoscopy;  Laterality: N/A;  . HEMORRHOID SURGERY  yrs ago  . TONSILLECTOMY  yrs ago    Prior to Admission medications   Medication Sig Start Date End Date Taking? Authorizing Provider  albuterol (PROVENTIL) (2.5 MG/3ML) 0.083% nebulizer solution INHALE CONTENTS OF 1 AMP VIA NEBULIZER EVERY 6 HRS AS NEEDED FOR WHEEZING/ SHORTNESS OF BREATH. 12/17/17  Yes [name], MD  Albuterol Sulfate (PROAIR RESPICLICK) 188 (90 Base) MCG/ACT AEPB Inhale 2 puffs into the lungs 4 (four) times daily as needed. Patient taking differently: Inhale 2 puffs into the lungs 4 (four) times daily as needed (sob and wheezing).  01/26/18  Yes [name], MD  benzonatate (TESSALON) 100 MG capsule TAKE 1 CAPSULE TWICE DAILY AS NEEDED FOR COUGH. 02/11/18  Yes [name], MD  Cholecalciferol 2000 UNITS TABS Take 2,000 Units by mouth daily.   Yes [provider]  clorazepate (TRANXENE) 7.5 MG tablet TAKE (1) TABLET TWICE DAILY AS NEEDED FOR ANXIETY. 02/11/18  Yes Laurey Morale, MD  Dextromethorphan-guaiFENesin ([place] FAST-MAX DM MAX) 5-100 MG/5ML LIQD Take 15 mLs by mouth every 4 (four) hours as needed (chest congestion).   Yes [provider]  diltiazem (CARDIZEM CD) 240 MG 24 hr capsule TAKE (1) CAPSULE DAILY. 02/11/18  Yes [name], MD  esomeprazole (NEXIUM) 40 MG capsule TAKE 1 CAPSULE DAILY BEFORE  BREAKFAST. 06/26/17  Yes [name], MD  levothyroxine (SYNTHROID, LEVOTHROID) 25 MCG tablet Take 1 tablet (25 mcg total) by mouth daily before breakfast. 06/26/17  Yes [name], MD  mometasone-formoterol [facility]) 200-5 MCG/ACT AERO Inhale 2 puffs into the lungs 2 (two) times daily.  02/23/18  Yes Patrecia Pour, Christean Grief, MD  Polyethyl Glycol-Propyl Glycol (SYSTANE OP) Place 1 drop into both eyes 2 (two) times daily as needed (for dry eyes).    Yes [provider]  predniSONE (DELTASONE) 10 MG tablet Take 4 tablets for 3 days; Take 3 tablets for 4 days; Take 2 tablets for 3 days; Take 1 tablet for 4 days Patient taking differently: Take 30 mg by mouth daily. Take 4 tablets for 3 days; Take 3 tablets for 4 days; Take 2 tablets for 3 days; Take 1 tablet for 4 days 02/23/18  Yes Patrecia Pour, Christean Grief, MD  sertraline (ZOLOFT) 50 MG tablet Take 1 tablet (50 mg total) by mouth daily. 09/28/17  Yes [name], MD  tiotropium (SPIRIVA HANDIHALER) 18 MCG inhalation capsule Place 1 capsule (18 mcg total) into inhaler and inhale daily. 01/26/18  Yes [name], MD  traMADol (ULTRAM) 50 MG tablet Take 1 tablet (50 mg total) by mouth every 6 (six) hours as needed. 01/26/18  Yes [name], MD  traZODone (DESYREL) 50 MG tablet TAKE 1/2 TO 1 TABLET AT BEDTIME AS NEEDED FOR REST. 01/18/18  Yes [name], MD  zolpidem (AMBIEN) 5 MG tablet TAKE 1 TABLET AT BEDTIME AS NEEDED. Patient taking differently: TAKE 1 TABLET PO AT BEDTIME AS NEEDED FOR SLEEP 02/09/18  Yes Dorena Cookey, MD    Scheduled Meds: . albuterol  2.5 mg Nebulization TID  . cholecalciferol  2,000 Units Oral Daily  . heparin  5,000 Units Subcutaneous Q8H  . levothyroxine  25 mcg Oral QAC breakfast  . metoprolol tartrate  12.5 mg Oral BID  . mometasone-formoterol  2 puff Inhalation BID  . pantoprazole  40 mg Oral Daily  . polyethylene glycol  17 g Oral BID  . predniSONE  20 mg Oral Q breakfast   Followed by  . [START ON 03/06/2018] predniSONE  10 mg Oral Q breakfast  . sertraline  50 mg Oral Daily  . tamsulosin  0.4 mg Oral QPC supper   Continuous Infusions: . 0.9 % NaCl with KCl 20 mEq / L 75 mL/hr at 03/04/18 0865  . ciprofloxacin 400 mg (03/04/18 1249)  . metronidazole    .  potassium chloride     PRN Meds:.albuterol, benzonatate, hydrOXYzine, menthol-cetylpyridinium, morphine injection, ondansetron **OR** ondansetron (ZOFRAN) IV, oxyCODONE, polyvinyl alcohol, senna-docusate, traMADol, traZODone  Allergies as of 02/27/2018 - Review Complete 02/27/2018  Allergen Reaction Noted  . Tylenol [acetaminophen] Other (See Comments) 12/11/2015  . Symbicort [budesonide-formoterol fumarate] Other (See Comments) 10/04/2013  . Duloxetine Other (See Comments)   . Metronidazole Other (See Comments)   . Rofecoxib Other (See Comments) 07/23/2009    Family History  Problem Relation Age of Onset  . Cancer Mother        Unknown type  . COPD Sister   . Cancer Sister        Breast  . Clotting disorder Neg Hx     Social History   Socioeconomic History  . Marital status: Widowed    Spouse name: Not on file  . Number of children: 1  . Years of education: Not on file  . Highest education level: Not on  file  Occupational History  . Not on file  Social Needs  . Financial resource strain: Not on file  . Food insecurity:    Worry: Not on file    Inability: Not on file  . Transportation needs:    Medical: Not on file    Non-medical: Not on file  Tobacco Use  . Smoking status: Never Smoker  . Smokeless tobacco: Never Used  . Tobacco comment: smoked rarely in youth  Substance and Sexual Activity  . Alcohol use: No    Alcohol/week: 0.0 oz  . Drug use: No  . Sexual activity: Not on file  Lifestyle  . Physical activity:    Days per week: Not on file    Minutes per session: Not on file  . Stress: Not on file  Relationships  . Social connections:    Talks on phone: Not on file    Gets together: Not on file    Attends religious service: Not on file    Active member of club or organization: Not on file    Attends meetings of clubs or organizations: Not on file    Relationship status: Not on file  . Intimate partner violence:    Fear of current or ex partner: Not  on file    Emotionally abused: Not on file    Physically abused: Not on file    Forced sexual activity: Not on file  Other Topics Concern  . Not on file  Social History Narrative   Widowed.  Lives alone in her own home.  Ambulates with a cane/walker when needed.    Review of Systems: Review of Systems  Constitutional: Positive for malaise/fatigue. Negative for chills and fever.  HENT: Negative for hearing loss and tinnitus.   Eyes: Negative for blurred vision and double vision.  Respiratory: Positive for shortness of breath. Negative for cough and hemoptysis.   Cardiovascular: Negative for chest pain and palpitations.  Gastrointestinal: Positive for abdominal pain, constipation, heartburn, nausea and vomiting. Negative for blood in stool and melena.  Genitourinary: Positive for urgency. Negative for dysuria.  Musculoskeletal: Positive for back pain. Negative for myalgias.  Skin: Negative for itching and rash.  Neurological: Negative for seizures and loss of consciousness.  Endo/Heme/Allergies: Does not bruise/bleed easily.  Psychiatric/Behavioral: Negative for hallucinations and suicidal ideas.    Physical Exam: Vital signs: Vitals:   03/04/18 0504 03/04/18 0837  BP: (!) 168/70   Pulse: 68   Resp: 18   Temp: 98.8 F (37.1 C)   SpO2: 92% 94%   Last BM Date: 02/27/18 Physical Exam  Constitutional: She is oriented to person, place, and time. She appears well-developed and well-nourished. She appears distressed.  Mild distress from abdominal pain and distention  HENT:  Head: Normocephalic and atraumatic.  Oropharynx dry.  Eyes: EOM are normal. No scleral icterus.  Neck: Normal range of motion. Neck supple.  Cardiovascular: Normal rate, regular rhythm and normal heart sounds.  Pulmonary/Chest: Effort normal. No respiratory distress.  Decreased breath sounds bilaterally. Anterior exam only.  Abdominal: Bowel sounds are normal. She exhibits distension. There is tenderness.  There is guarding. There is no rebound.  Abdomen is difficult and distended with generalized tenderness to palpation. Positive for guarding But negative for rebound  Musculoskeletal: Normal range of motion. She exhibits no edema.  Neurological: She is alert and oriented to person, place, and time.  Skin: Skin is dry. No erythema.  Psychiatric: She has a normal mood and affect. Judgment normal.  GI:  Lab Results: Recent Labs    03/02/18 0645 03/03/18 0553  WBC 18.8* 13.7*  HGB 11.1* 9.6*  HCT 36.1 31.2*  PLT 348 285   BMET Recent Labs    03/02/18 0645 03/03/18 0553 03/04/18 0529  NA 140 140 139  K 3.9 3.5 3.4*  CL 102 103 103  CO2 28 26 25   GLUCOSE 82 86 87  BUN 8 10 8   CREATININE 0.70 0.68 0.68  CALCIUM 8.4* 8.1* 7.7*   LFT Recent Labs    03/03/18 0553  PROT 4.9*  ALBUMIN 2.1*  AST 10*  ALT 10*  ALKPHOS 96  BILITOT 0.5   PT/INR No results for input(s): LABPROT, INR in the last 72 hours.   Studies/Results: No results found.  Impression/Plan: - Generalized abdominal pain as well as distention. No bowel movement in last 5 days. Not passing gas. Need to rule out small bowel obstruction. CT on admission was negative for obstruction but patient's symptoms are getting worse. Abdominal pain could be from her colitis.  - Abnormal CT scan showing colitis - Recurrent nausea and vomiting. Small bowel obstruction versus biliary etiology. LFTs normal. Lipase is normal. - Significant leukocytosis. Improving. CBC on 02/27/2018 showed significant leukocytosis with a WBCs of 36,000. White counts are trending down and now 13.7. - Anemia. Most likely dilutional. Hemoglobin was normal on admission , now down to 9.6    Recommendations ------------------------- - Repeat CT abdomen pelvis today to rule out small bowel obstruction. CT will also help rule out internal bleeding given the drop in hemoglobin. - If CT scan negative, consider ultrasound and HIDA scan to rule out  biliary etiology. - Consider NG suction if continues to worsening abdominal distention. - Continue antibiotics. - GI will follow   LOS: 5 days   Otis Brace  MD, FACP 03/04/2018, 1:06 PM  Contact #  [phone]

## 2018-03-04 NOTE — Consult Note (Addendum)
   [facility] CM Inpatient Consult   03/04/2018  Heather [name] [facility] 06-Jun-1935 [id number]    [facility] Care Management follow up.   Confirmed with inpatient RNCM and with [facility] with [place] that Heather [name] will go home with [facility] First program at discharge.   Spoke with Heather [name] at bedside to make her aware that [place] Management will assist if transportation or medication needs are identified while on the [place] program. She expresses understanding of this. Heather [name] also made aware that [place] Management could potentially follow again post [place] completion.  Will update [facility] Community team of Heather [name]'s enrollment with [place]. Therefore, [place] Management will not follow for community case management at this time.   Will update [facility] Care Management office.   [name], MSN-Ed, RN,BSN [facility] Liaison [phone]

## 2018-03-04 NOTE — Progress Notes (Signed)
Patient vomited few times moderate amounts of clear and yellow thick mucus, Zofran was given with no relief MD was paged, new order was received  for Compazine, medication was given to patient with only mild relief, patient could not keep PO Oxycodone, MD was paged again, he ordered  Morphine IV. Patient is medicated with Morphine this morning.

## 2018-03-05 ENCOUNTER — Inpatient Hospital Stay (HOSPITAL_COMMUNITY): Payer: Medicare Other

## 2018-03-05 ENCOUNTER — Encounter: Payer: Self-pay | Admitting: *Deleted

## 2018-03-05 ENCOUNTER — Other Ambulatory Visit: Payer: Self-pay | Admitting: *Deleted

## 2018-03-05 DIAGNOSIS — R109 Unspecified abdominal pain: Secondary | ICD-10-CM

## 2018-03-05 LAB — CBC WITH DIFFERENTIAL/PLATELET
Basophils Absolute: 0.1 10*3/uL (ref 0.0–0.1)
Basophils Relative: 1 %
Eosinophils Absolute: 0 10*3/uL (ref 0.0–0.7)
Eosinophils Relative: 0 %
HCT: 40.8 % (ref 36.0–46.0)
Hemoglobin: 13 g/dL (ref 12.0–15.0)
Lymphocytes Relative: 11 %
Lymphs Abs: 1.9 10*3/uL (ref 0.7–4.0)
MCH: 27.2 pg (ref 26.0–34.0)
MCHC: 31.9 g/dL (ref 30.0–36.0)
MCV: 85.4 fL (ref 78.0–100.0)
Monocytes Absolute: 3 10*3/uL (ref 0.1–1.0)
Monocytes Relative: 16 %
Neutro Abs: 13.2 10*3/uL (ref 1.7–7.7)
Neutrophils Relative %: 72 %
Platelets: 319 10*3/uL (ref 150–400)
RBC: 4.78 MIL/uL (ref 3.87–5.11)
RDW: 16 % — ABNORMAL HIGH (ref 11.5–15.5)
WBC: 18.2 10*3/uL — ABNORMAL HIGH (ref 4.0–10.5)

## 2018-03-05 LAB — GLUCOSE, CAPILLARY
Glucose-Capillary: 103 mg/dL — ABNORMAL HIGH (ref 65–99)
Glucose-Capillary: 118 mg/dL — ABNORMAL HIGH (ref 65–99)
Glucose-Capillary: 120 mg/dL — ABNORMAL HIGH (ref 65–99)
Glucose-Capillary: 68 mg/dL (ref 65–99)
Glucose-Capillary: 87 mg/dL (ref 65–99)
Glucose-Capillary: 88 mg/dL (ref 65–99)
Glucose-Capillary: 88 mg/dL (ref 65–99)
Glucose-Capillary: 91 mg/dL (ref 65–99)
Glucose-Capillary: 93 mg/dL (ref 65–99)

## 2018-03-05 LAB — COMPREHENSIVE METABOLIC PANEL
ALT: 11 U/L — ABNORMAL LOW (ref 14–54)
AST: 25 U/L (ref 15–41)
Albumin: 2.1 g/dL — ABNORMAL LOW (ref 3.5–5.0)
Alkaline Phosphatase: 106 U/L (ref 38–126)
Anion gap: 11 (ref 5–15)
BUN: <5 mg/dL — ABNORMAL LOW (ref 6–20)
CO2: 22 mmol/L (ref 22–32)
Calcium: 7.1 mg/dL — ABNORMAL LOW (ref 8.9–10.3)
Chloride: 101 mmol/L (ref 101–111)
Creatinine, Ser: 0.48 mg/dL (ref 0.44–1.00)
GFR calc Af Amer: >60 mL/min (ref >60)
GFR calc non Af Amer: >60 mL/min (ref >60)
Glucose, Bld: 94 mg/dL (ref 65–99)
Potassium: 3.1 mmol/L — ABNORMAL LOW (ref 3.5–5.1)
Sodium: 134 mmol/L — ABNORMAL LOW (ref 135–145)
Total Bilirubin: 0.5 mg/dL (ref 0.3–1.2)
Total Protein: 4.9 g/dL — ABNORMAL LOW (ref 6.5–8.1)

## 2018-03-05 MED ORDER — METOPROLOL TARTRATE 5 MG/5ML IV SOLN
5.0000 mg | Freq: Three times a day (TID) | INTRAVENOUS | Status: DC
  Administered 2018-03-05 – 2018-03-10 (×15): 5 mg via INTRAVENOUS
  Filled 2018-03-05 (×15): qty 5

## 2018-03-05 MED ORDER — POTASSIUM CHLORIDE 10 MEQ/100ML IV SOLN
10.0000 meq | INTRAVENOUS | Status: AC
  Administered 2018-03-05 – 2018-03-06 (×5): 10 meq via INTRAVENOUS
  Filled 2018-03-05 (×2): qty 100

## 2018-03-05 MED ORDER — POTASSIUM CHLORIDE 10 MEQ/100ML IV SOLN: 10.0000 meq | INTRAVENOUS | Status: DC

## 2018-03-05 MED ORDER — LEVOTHYROXINE SODIUM 100 MCG IV SOLR
12.5000 ug | Freq: Every day | INTRAVENOUS | Status: DC
  Administered 2018-03-05 – 2018-03-09 (×5): 12.5 ug via INTRAVENOUS
  Filled 2018-03-05 (×5): qty 5

## 2018-03-05 MED ORDER — HYDROMORPHONE HCL 1 MG/ML IJ SOLN
0.5000 mg | INTRAMUSCULAR | Status: DC | PRN
  Administered 2018-03-05 – 2018-03-08 (×16): 0.5 mg via INTRAVENOUS
  Filled 2018-03-05 (×16): qty 0.5

## 2018-03-05 MED ORDER — MENTHOL 3 MG MT LOZG
1.0000 | LOZENGE | OROMUCOSAL | Status: DC | PRN
  Administered 2018-03-08: 3 mg via ORAL
  Filled 2018-03-05: qty 9

## 2018-03-05 MED ORDER — TECHNETIUM TC 99M MEBROFENIN IV KIT
5.1900 | PACK | Freq: Once | INTRAVENOUS | Status: AC | PRN
  Administered 2018-03-05: 5.19 via INTRAVENOUS

## 2018-03-05 NOTE — Progress Notes (Signed)
Physical Therapy Treatment Patient Details Name: Heather [name] MRN: [id number] DOB: 27-Jan-1935 Today's Date: 03/05/2018    History of Present Illness 83 year old who was discharged on 02/22/2018 after treatment for acute hypoxic respiratory failure secondary to COPD exacerbation who readmitted on 02/27/2018 with abdominal pain nausea and vomiting and findings suggestive of colitis.  NG tube placed 02/04/18    PT Comments    Pt encouraged to ambulate and agreeable although reports feeling very lousy.  Distance limited by dizziness and abdominal pain (approximately 30 feet total).  Pt now with NG tube although question functioning of suctioning ability on wall unit (RN notified).  Pt with plans for HIDA scan later today and encouraged to sit up in recliner end of session.    Follow Up Recommendations  Supervision for mobility/OOB;SNF     Equipment Recommendations  None recommended by PT    Recommendations for Other Services       Precautions / Restrictions Precautions Precautions: Fall Precaution Comments: NG tube    Mobility  Bed Mobility Overal bed mobility: Needs Assistance Bed Mobility: Supine to Sit;Sit to Supine     Supine to sit: Supervision;HOB elevated Sit to supine: Supervision;HOB elevated   General bed mobility comments: increased time  Transfers Overall transfer level: Needs assistance Equipment used: Rolling walker (2 wheeled) Transfers: Sit to/from Stand Sit to Stand: Min assist;+2 safety/equipment         General transfer comment: verbal cues for safe technique, assist to rise and steady  Ambulation/Gait Ambulation/Gait assistance: Min assist;+2 safety/equipment Ambulation Distance (Feet): 30 Feet(total) Assistive device: Rolling walker (2 wheeled) Gait Pattern/deviations: Step-through pattern;Decreased stride length;Trunk flexed     General Gait Details: pt reports dizziness and abdominal pain limiting ambulation; vitals WNL taken with seated  rest break, 10' x1 and then another 20' x1   Stairs            Wheelchair Mobility    Modified Rankin (Stroke Patients Only)       Balance                                            Cognition Arousal/Alertness: Awake/alert Behavior During Therapy: WFL for tasks assessed/performed Overall Cognitive Status: Within Functional Limits for tasks assessed                                        Exercises      General Comments        Pertinent Vitals/Pain Pain Assessment: 0-10 Pain Score: 10-Worst pain ever Pain Location: lower abdomen Pain Descriptors / Indicators: Aching Pain Intervention(s): Limited activity within patient's tolerance;Repositioned;Monitored during session    Home Living                      Prior Function            PT Goals (current goals can now be found in the care plan section) Progress towards PT goals: Progressing toward goals    Frequency    Min 3X/week      PT Plan Current plan remains appropriate    Co-evaluation              AM-PAC PT "6 Clicks" Daily Activity  Outcome Measure  Difficulty turning over in bed (including adjusting bedclothes,  sheets and blankets)?: A Little Difficulty moving from lying on back to sitting on the side of the bed? : A Little Difficulty sitting down on and standing up from a chair with arms (e.g., wheelchair, bedside commode, etc,.)?: A Little Help needed moving to and from a bed to chair (including a wheelchair)?: A Little Help needed walking in hospital room?: A Lot Help needed climbing 3-5 steps with a railing? : A Lot 6 Click Score: 16    End of Session Equipment Utilized During Treatment: Gait belt Activity Tolerance: Patient limited by fatigue Patient left: in chair;with call bell/phone within reach;with family/visitor present;with chair alarm set Nurse Communication: Mobility status PT Visit Diagnosis: Difficulty in walking, not  elsewhere classified (R26.2);Muscle weakness (generalized) (M62.81);Unsteadiness on feet (R26.81)     Time: 2751-7001 PT Time Calculation (min) (ACUTE ONLY): 25 min  Charges:  $Gait Training: 8-22 mins                    G Codes:       Carmelia Bake, PT, DPT 03/05/2018 Pager: [phone]  York Ram E 03/05/2018, 2:35 PM

## 2018-03-05 NOTE — Patient Outreach (Signed)
[facility]) Care Management  03/05/2018  Heather [name] 05/27/1935 [id number]    Hospital liaison has indicated pt is with [facility] and will be discharged via [facility] services due to external case management program. Will alert primary provider of pt's disposition via [facility] service and deactivate at this time.  [name], RN Care Management Coordinator [place] Office [phone]

## 2018-03-05 NOTE — Consult Note (Signed)
[facility] Surgery Consult/Admission Note  Heather [name] [facility] 11/04/1935  [id number].    Requesting MD: Dr. Alessandra Bevels Chief Complaint/Reason for Consult: gallbladder distention seen on CT  HPI:   Patient is an 83 year old female who was discharged on 02/22/2018 after treatment for acute hypoxic respiratory failure secondary to COPD exacerbation who readmitted on 02/27/2018 with abdominal pain nausea and vomiting and findings suggestive of colitis.  Repeat CT scan yesterday showed newly abnormally distended gallbladder with new dilatation of the common bile duct and part of the dorsal pancreatic duct.  Patient states she is still having continued diffuse severe, nonradiating abdominal pain.  She has not had flatus or bowel movement since admission.  NG tube in place with bilious output.  WBC is 18.2, LFTs WNL, T bili 0.5.  Son at bedside.  We were asked to see for changes in gallbladder seen on CT.   ROS:  Review of Systems  Constitutional: Negative for chills, diaphoresis and fever.  HENT: Positive for sore throat (from NGT).   Respiratory: Negative for cough and shortness of breath.   Cardiovascular: Negative for chest pain.  Gastrointestinal: Positive for abdominal pain, constipation, nausea and vomiting. Negative for blood in stool and diarrhea.  Genitourinary: Negative for dysuria.  Skin: Negative for rash.  Neurological: Negative for dizziness and loss of consciousness.  All other systems reviewed and are negative.    Family History  Problem Relation Age of Onset  . Cancer Mother        Unknown type  . COPD Sister   . Cancer Sister        Breast  . Clotting disorder Neg Hx     Past Medical History:  Diagnosis Date  . ANXIETY 07/23/2009  . Atherosclerosis of aorta ([place]) 07/24/2015  . Complication of anesthesia 7 or 8 yrs ago   woke up during colonscopy  . Coronary atherosclerosis 07/24/2015  . DEPRESSION 07/23/2009  . DIVERTICULOSIS, COLON 07/23/2009  . GERD  07/23/2009  . Headache(784.0) 07/23/2009   occasional  . Hemorrhoids   . HIP PAIN, LEFT 05/16/2010  . History of Crohn's disease   . HYPERLIPIDEMIA 07/23/2009  . HYPERTENSION 07/23/2009  . HYPOTHYROIDISM 07/23/2009  . IBS (irritable bowel syndrome)   . Insomnia   . LOW BACK PAIN, CHRONIC 10/01/2009  . PARESTHESIA 10/01/2009  . Shingles 2006   back    Past Surgical History:  Procedure Laterality Date  . ABDOMINAL HYSTERECTOMY  age 54 or 44  . APPENDECTOMY  yrs ago  . BILATERAL SALPINGOOPHORECTOMY  age 31 or 88  . COLONOSCOPY N/A 11/17/2013   Procedure: COLONOSCOPY;  Surgeon: Cleotis Nipper, MD;  Location: [facility];  Service: Endoscopy;  Laterality: N/A;  . ESOPHAGOGASTRODUODENOSCOPY N/A 11/17/2013   Procedure: ESOPHAGOGASTRODUODENOSCOPY (EGD);  Surgeon: Cleotis Nipper, MD;  Location: [facility];  Service: Endoscopy;  Laterality: N/A;  . HEMORRHOID SURGERY  yrs ago  . TONSILLECTOMY  yrs ago    Social History:  reports that she has never smoked. She has never used smokeless tobacco. She reports that she does not drink alcohol or use drugs.  Allergies:  Allergies  Allergen Reactions  . Tylenol [Acetaminophen] Other (See Comments)    Nightmares  . Symbicort [Budesonide-Formoterol Fumarate] Other (See Comments)    Pt felt like her tongue was swollen, could not swallow  . Duloxetine Other (See Comments)    Patient doesn't recall  . Metronidazole Other (See Comments)    Patient doesn't recall   . Rofecoxib Other (See Comments)  Patient doesn't recall     Medications Prior to Admission  Medication Sig Dispense Refill  . albuterol (PROVENTIL) (2.5 MG/3ML) 0.083% nebulizer solution INHALE CONTENTS OF 1 AMP VIA NEBULIZER EVERY 6 HRS AS NEEDED FOR WHEEZING/ SHORTNESS OF BREATH. 225 mL 0  . Albuterol Sulfate (PROAIR RESPICLICK) 540 (90 Base) MCG/ACT AEPB Inhale 2 puffs into the lungs 4 (four) times daily as needed. (Patient taking differently: Inhale 2 puffs into the lungs  4 (four) times daily as needed (sob and wheezing). ) 1 each 6  . benzonatate (TESSALON) 100 MG capsule TAKE 1 CAPSULE TWICE DAILY AS NEEDED FOR COUGH. 20 capsule 0  . Cholecalciferol 2000 UNITS TABS Take 2,000 Units by mouth daily.    . clorazepate (TRANXENE) 7.5 MG tablet TAKE (1) TABLET TWICE DAILY AS NEEDED FOR ANXIETY. 60 tablet 0  . Dextromethorphan-guaiFENesin ([place] FAST-MAX DM MAX) 5-100 MG/5ML LIQD Take 15 mLs by mouth every 4 (four) hours as needed (chest congestion).    [name] diltiazem (CARDIZEM CD) 240 MG 24 hr capsule TAKE (1) CAPSULE DAILY. 90 capsule 0  . esomeprazole (NEXIUM) 40 MG capsule TAKE 1 CAPSULE DAILY BEFORE BREAKFAST. 90 capsule 2  . levothyroxine (SYNTHROID, LEVOTHROID) 25 MCG tablet Take 1 tablet (25 mcg total) by mouth daily before breakfast. 90 tablet 1  . mometasone-formoterol (DULERA) 200-5 MCG/ACT AERO Inhale 2 puffs into the lungs 2 (two) times daily. 1 Inhaler 0  . Polyethyl Glycol-Propyl Glycol (SYSTANE OP) Place 1 drop into both eyes 2 (two) times daily as needed (for dry eyes).     . predniSONE (DELTASONE) 10 MG tablet Take 4 tablets for 3 days; Take 3 tablets for 4 days; Take 2 tablets for 3 days; Take 1 tablet for 4 days (Patient taking differently: Take 30 mg by mouth daily. Take 4 tablets for 3 days; Take 3 tablets for 4 days; Take 2 tablets for 3 days; Take 1 tablet for 4 days) 34 tablet 0  . sertraline (ZOLOFT) 50 MG tablet Take 1 tablet (50 mg total) by mouth daily. 90 tablet 3  . tiotropium (SPIRIVA HANDIHALER) 18 MCG inhalation capsule Place 1 capsule (18 mcg total) into inhaler and inhale daily. 90 capsule 3  . traMADol (ULTRAM) 50 MG tablet Take 1 tablet (50 mg total) by mouth every 6 (six) hours as needed. 90 tablet 0  . traZODone (DESYREL) 50 MG tablet TAKE 1/2 TO 1 TABLET AT BEDTIME AS NEEDED FOR REST. 90 tablet 0  . zolpidem (AMBIEN) 5 MG tablet TAKE 1 TABLET AT BEDTIME AS NEEDED. (Patient taking differently: TAKE 1 TABLET PO AT BEDTIME AS NEEDED FOR  SLEEP) 30 tablet 0    Blood pressure (!) 166/82, pulse 100, temperature 99.2 F (37.3 C), temperature source Oral, resp. rate 18, height _0  (1.499 m), weight 122 lb 12.7 oz (55.7 kg), SpO2 92 %.  Physical Exam  Constitutional: She is oriented to person, place, and time and well-developed, well-nourished, and in no distress. No distress.  HENT:  Head: Normocephalic and atraumatic.  Nose: Nose normal.  Mouth/Throat: Mucous membranes are normal.  Eyes: Pupils are equal, round, and reactive to light. Conjunctivae are normal. Right eye exhibits no discharge. Left eye exhibits no discharge. No scleral icterus.  Neck: Normal range of motion. Neck supple. No thyromegaly present.  Cardiovascular: Normal rate, regular rhythm, normal heart sounds and intact distal pulses.  No murmur heard. Pulses:      Radial pulses are 2+ on the right side, and 2+ on the left  side.  Pulmonary/Chest: Effort normal and breath sounds normal. No respiratory distress. She has no wheezes. She has no rhonchi. She has no rales.  Abdominal: Soft. She exhibits distension. Bowel sounds are hypoactive and tinkling. There is generalized tenderness. There is no rigidity and no guarding.  Diffusely distended abdomen worse in the upper and left areas.  Due to distended colon was unable to assess for Murphy sign or hepatosplenomegaly  Musculoskeletal: Normal range of motion. She exhibits no edema, tenderness or deformity.  Lymphadenopathy:    She has no cervical adenopathy.  Neurological: She is alert and oriented to person, place, and time.  Skin: Skin is warm and dry. No rash noted. She is not diaphoretic.  Psychiatric: Mood and affect normal.  Nursing note and vitals reviewed.   Results for orders placed or performed during the hospital encounter of 02/27/18 (from the past 48 hour(s))  Basic metabolic panel     Status: Abnormal   Collection Time: 03/04/18  5:29 AM  Result Value Ref Range   Sodium 139 135 - 145 mmol/L    Potassium 3.4 (L) 3.5 - 5.1 mmol/L   Chloride 103 101 - 111 mmol/L   CO2 25 22 - 32 mmol/L   Glucose, Bld 87 65 - 99 mg/dL   BUN 8 6 - 20 mg/dL   Creatinine, Ser 0.68 0.44 - 1.00 mg/dL   Calcium 7.7 (L) 8.9 - 10.3 mg/dL   GFR calc non Af Amer >60 >60 mL/min   GFR calc Af Amer >60 >60 mL/min    Comment: (NOTE) The eGFR has been calculated using the CKD EPI equation. This calculation has not been validated in all clinical situations. eGFR's persistently <60 mL/min signify possible Chronic Kidney Disease.    Anion gap 11 5 - 15    Comment: Performed at [facility], [place] [address] [place], [place] [address]  Glucose, capillary     Status: Abnormal   Collection Time: 03/04/18  5:37 PM  Result Value Ref Range   Glucose-Capillary 152 (H) 65 - 99 mg/dL  Glucose, capillary     Status: None   Collection Time: 03/04/18  8:04 PM  Result Value Ref Range   Glucose-Capillary 96 65 - 99 mg/dL  Glucose, capillary     Status: None   Collection Time: 03/05/18  4:04 AM  Result Value Ref Range   Glucose-Capillary 88 65 - 99 mg/dL  CBC with Differential/Platelet     Status: Abnormal   Collection Time: 03/05/18  5:21 AM  Result Value Ref Range   WBC 18.2 (H) 4.0 - 10.5 K/uL   RBC 4.78 3.87 - 5.11 MIL/uL   Hemoglobin 13.0 12.0 - 15.0 g/dL    Comment: REPEATED TO VERIFY DELTA CHECK NOTED    HCT 40.8 36.0 - 46.0 %   MCV 85.4 78.0 - 100.0 fL   MCH 27.2 26.0 - 34.0 pg   MCHC 31.9 30.0 - 36.0 g/dL   RDW 16.0 (H) 11.5 - 15.5 %   Platelets 319 150 - 400 K/uL   Neutrophils Relative % 72 %   Neutro Abs 13.2 1.7 - 7.7 K/uL   Lymphocytes Relative 11 %   Lymphs Abs 1.9 0.7 - 4.0 K/uL   Monocytes Relative 16 %   Monocytes Absolute 3.0 0.1 - 1.0 K/uL   Eosinophils Relative 0 %   Eosinophils Absolute 0.0 0.0 - 0.7 K/uL   Basophils Relative 1 %   Basophils Absolute 0.1 0.0 - 0.1 K/uL  WBC Morphology MILD LEFT SHIFT (1-5% METAS, OCC MYELO, OCC BANDS)     Comment: Performed at  [place] [address] [place], [place] [address]  Comprehensive metabolic panel     Status: Abnormal   Collection Time: 03/05/18  5:21 AM  Result Value Ref Range   Sodium 134 (L) 135 - 145 mmol/L   Potassium 3.1 (L) 3.5 - 5.1 mmol/L   Chloride 101 101 - 111 mmol/L   CO2 22 22 - 32 mmol/L   Glucose, Bld 94 65 - 99 mg/dL   BUN <5 (L) 6 - 20 mg/dL   Creatinine, Ser 0.48 0.44 - 1.00 mg/dL   Calcium 7.1 (L) 8.9 - 10.3 mg/dL   Total Protein 4.9 (L) 6.5 - 8.1 g/dL   Albumin 2.1 (L) 3.5 - 5.0 g/dL   AST 25 15 - 41 U/L   ALT 11 (L) 14 - 54 U/L   Alkaline Phosphatase 106 38 - 126 U/L   Total Bilirubin 0.5 0.3 - 1.2 mg/dL   GFR calc non Af Amer >60 >60 mL/min   GFR calc Af Amer >60 >60 mL/min    Comment: (NOTE) The eGFR has been calculated using the CKD EPI equation. This calculation has not been validated in all clinical situations. eGFR's persistently <60 mL/min signify possible Chronic Kidney Disease.    Anion gap 11 5 - 15    Comment: Performed at [facility], [place] [address] [place], [place] [address]  Glucose, capillary     Status: None   Collection Time: 03/05/18  8:14 AM  Result Value Ref Range   Glucose-Capillary 87 65 - 99 mg/dL   Ct Abdomen Pelvis W Contrast  Result Date: 03/04/2018 CLINICAL DATA:  Abdominal pain, prior distal colitis EXAM: CT ABDOMEN AND PELVIS WITH CONTRAST TECHNIQUE: Multidetector CT imaging of the abdomen and pelvis was performed using the standard protocol following bolus administration of intravenous contrast. CONTRAST:  157m ISOVUE-300 IOPAMIDOL (ISOVUE-300) INJECTION 61%, 139mISOVUE-300 IOPAMIDOL (ISOVUE-300) INJECTION 61% COMPARISON:  02/27/2018 FINDINGS: Despite efforts by the technologist and patient, motion artifact is present on today's exam and could not be eliminated. This reduces exam sensitivity and specificity. Lower chest: Small new bilateral pleural effusions with passive atelectasis. These  effusions are nonspecific for transudative or exudative etiology. Borderline wall thickening of the distal thoracic esophagus. Hepatobiliary: Abnormally distended gallbladder, 5.4 cm in diameter. The common bile duct measures 1.2 cm in diameter and previously was only 0.5 cm in diameter. Pancreas: Mild dorsal pancreatic duct dilatation in the pancreatic body and head, increased from prior. Spleen: Unremarkable Adrenals/Urinary Tract: Unremarkable. Foley catheter in the urinary bladder. Stomach/Bowel: No obvious mass in the vicinity of the ampulla The colon is moderately distended with abnormal wall thickening extending from the distal transverse colon through to the sigmoid colon. Mildly accentuated associated enhancement. Air fluid levels in the distal colon compatible with diarrheal process. There is sigmoid colon diverticulosis without appreciable active diverticulitis. Along the anterior wall of the descending colon, there several small patches of relative mucosal hypoenhancement with some exaggerated stranding along the margins, for example on images 94 through 101 of series 6. Small caliber terminal ileum particularly compared to the prominent colon. Small bowel loops are relatively unremarkable and the rectum appears unremarkable. Vascular/Lymphatic: Aortoiliac atherosclerotic vascular disease. Patent celiac trunk, SMA, and inferior mesenteric artery. No pathologic adenopathy identified. Reproductive: Uterus absent.  Adnexa unremarkable. Other: Scattered moderate ascites. No free intraperitoneal gas is identified. Musculoskeletal: Bilateral flank edema  increased from 02/27/2018. There is edema tracking along the upper thigh musculature, right greater than left. IMPRESSION: 1. Abnormal appearance of the colon, once again with distended colon, distal air-fluid levels indicating diarrheal process, and abnormal accentuated enhancement of the colon wall in the descending colon and proximal sigmoid. There are 2  regions of anterior hypoenhancement along the affected colon wall, of uncertain significance. There is sigmoid diverticulosis without active diverticulitis. Overall the appearance is compatible with colitis and could be due to prominent infectious colitis or inflammatory bowel disease such as Crohn's colitis. The central mesenteric vessels are patent making ischemic colitis less likely but not totally excluded. There is new scattered scattered moderate ascites including ascites around the descending colon along with inflammatory stranding. 2. Along with the ascites, there small bilateral pleural effusions and bilateral flank edema tracking into the upper thighs indicating third spacing of fluids. 3. Newly abnormally distended gallbladder, 5.4 cm in diameter, with new dilatation of the common bile duct and of part of the dorsal pancreatic duct. I do not see an obvious lesion in the vicinity of the ampulla. The possibility of occult choledocholithiasis or occult ampullary lesion is raised. If assessment for acute cholecystitis is warranted, hepatobiliary nuclear medicine scan can be used to assess cystic duct patency. There is no current significant intrahepatic biliary dilatation. 4.  Aortic Atherosclerosis (ICD10-I70.0). Electronically Signed   By: Van Clines M.D.   On: 03/04/2018 17:04      Assessment/Plan Principal Problem:   Colitis Active Problems:   Hypothyroidism   Dyslipidemia   Anxiety and depression   Essential hypertension   COPD with emphysema (HCC)   Malnutrition of moderate degree   Hypokalemia   Abdominal pain   SBO (small bowel obstruction) (HCC)   Dehydration  Distended gallbladder seen on CT -HIDA scan pending to better evaluate gallbladder  -CT scan showed distended gallbladder new dilatation of the CBD.  LFTs and T bili within normal limits.  Mild gallbladder distention seen on CT scan on 3/23.  This could be inflammation 2/2 transverse colitis.  - would recommend  dietitian consult and starting TPN in the setting of prolonged NGT  Continue NPO.  We will follow.  Thank you for the consult.  Kalman Drape, [facility] Surgery 03/05/2018, 10:38 AM Pager: [phone] Consults: [phone] Mon-Fri 7:00 am-4:30 pm Sat-Sun 7:00 am-11:30 am

## 2018-03-05 NOTE — Progress Notes (Signed)
[facility] Gastroenterology Progress Note  Heather [name] 83 y.o. [birth year]  CC:  Nausea, vomiting, abdominal pain, colitis   Subjective: patient continues to have nausea vomiting and abdominal pain along with abdominal distention. Has a NG tube placement yesterday night with removal of around 300 mL of bilious fluid. No bowel movement since last 5 days.  ROS : positive for weakness and fatigue. Negative for chest pain.   Objective: Vital signs in last 24 hours: Vitals:   03/05/18 0408 03/05/18 0715  BP: (!) 166/82   Pulse: 100   Resp: 18   Temp: 99.2 F (37.3 C)   SpO2: 94% 92%    Physical Exam:  General.Alert oriented 3. In mild distress.NG tube in place. Heart. Regular rhythm regular. Abdomen. Significantly distended with generalized tenderness to palpation, hyperactive bowel sound, positive guarding and left lower quadrant. No rebound  Lab Results: Recent Labs    03/03/18 0553 03/04/18 0529 03/05/18 0521  NA 140 139 134*  K 3.5 3.4* 3.1*  CL 103 103 101  CO2 26 25 22   GLUCOSE 86 87 94  BUN 10 8 <5*  CREATININE 0.68 0.68 0.48  CALCIUM 8.1* 7.7* 7.1*  MG 2.2  --   --    Recent Labs    03/03/18 0553 03/05/18 0521  AST 10* 25  ALT 10* 11*  ALKPHOS 96 106  BILITOT 0.5 0.5  PROT 4.9* 4.9*  ALBUMIN 2.1* 2.1*   Recent Labs    03/03/18 0553 03/05/18 0521  WBC 13.7* 18.2*  NEUTROABS  --  13.2  HGB 9.6* 13.0  HCT 31.2* 40.8  MCV 88.6 85.4  PLT 285 319   No results for input(s): LABPROT, INR in the last 72 hours.    Assessment/Plan: - Generalized abdominal pain as well as distention. No bowel movement in last 5 days. Not passing gas. Repeat CT scan yesterday showed abnormally distended gallbladder with a bile duct dilatation. LFTsnormal. - Abnormal CT scan showing colitis with wall thickening from distal transverse colon through the sigmoid colon. - new-onset ascites. - Thickening of the distal esophagus. Could be reactive. - Recurrent nausea and  vomiting. Most likely biliary etiology. LFTs normal. Lipase is normal. - Significant leukocytosis. Improving. CBC on 02/27/2018 showed significant leukocytosis with a WBCs of 36,000. White counts are trending down and now 13.7. - Anemia. Most likely dilutional. Hemoglobin was normal on admission , ecent increase in hemoglobin could be from hemoconcentration.   Recommendations ------------------------- - Surgery consulted for further evaluation of significantly distended and abnormal gallbladder. She may need a HIDA scan for further evaluation and  possibly a percutaneous cholecystostomy tube. - consider  diagnostic paracentesis after surgical evaluation - continue antibiotics - GI will follow    Otis Brace MD, [place] 03/05/2018, 8:57 AM  Contact #  [phone]

## 2018-03-05 NOTE — Progress Notes (Signed)
Patient Demographics:    Heather [name], is a 83 y.o. female, DOB - Jul 05, 1935, OZH:[id number]  Admit date - 02/27/2018   Admitting Physician Ankit Arsenio Loader, MD  Outpatient Primary MD for the patient is [name], MD  LOS - 6  Chief Complaint  Patient presents with  . Abdominal Pain  . Constipation        Subjective:    Heather [name] today continues to have nausea, abdominal pain and distention.     Assessment  & Plan :    Principal Problem:   Colitis Active Problems:   Hypothyroidism   Dyslipidemia   Anxiety and depression   Essential hypertension   COPD with emphysema (HCC)   Malnutrition of moderate degree   Hypokalemia   Abdominal pain   SBO (small bowel obstruction) (HCC)   Dehydration   Brief Summary 83 year old who was discharged on 02/22/2018 after treatment for acute hypoxic respiratory failure secondary to COPD exacerbation who readmitted on 02/27/2018 with abdominal pain nausea and vomiting and findings suggestive of colitis.  Patient has a history of chronic constipation typically,. Patient also found to have a mild ileus or low-grade bowel obstruction from distal colitis. Patient initiated on IV Zosyn since 02/27/2018.  C scope 2014: Scattered diverticulosis, flat polyp in the proximal colon EGD 2014: Normal endoscopy   Plan:- 1)Left-sided/Descending and sigmoid colon colitis--- colitis could be infectious, ischemic colitis less likely, also concern for C. Difficile colitis given recent admission between 3/15-3/19 for COPD  Exacerbation and treatment with antibiotics.  - On Zosyn (started 02/27/18), concerns about possible ileus versus small bowel obstruction repeat abdominal x-rays ,  Showed mild ileus.  moderate colonic stool burden with no impaction. -Previously it was mentioned that the patient had a large BM.  However, I discussed with the nurse and  with the patient and the patient denies having a bowel movement.  - She is complaining of worsening abdominal distention, nausea with vomiting.  She is unable to tolerate anything orally.  - Has NG tube. NPO. - Close Monitoring of Accu-Cheks while NPO. - Consulted GI. - Repeat CT of abdomen and pelvis 03/04/18 showing abnormal appearance of the colon, distended colon, air-fluid levels. Findings concerning for infectious colitis or inflammatory bowel disease. Also Newly abnormally distended gallbladder, 5.4 cm in diameter, with new dilatation of the common bile duct and of part of the dorsal pancreatic duct. -Surgery consulted. Plan for HIDA scan today.  - On IV cipro and flagyl.   - WBC count elevated to 18.2.  She is still having abdominal pain.  -Stool studies ordered but the patient did not have a bowel movement.     2)Acute kidney injury -now resolved, suspect due to poor oral intake and GI losses , patient's baseline creatinine is typically 0.6 on admission creatinine was up to 1.1, creatinine is back down to 0.48, avoid nephrotoxic agents  3) COPD/emphysema- continue supplemental oxygen, use incentive spirometry, continue bronchodilators. - Stable at this time.  4)HTN-patient switched from Cardizem to metoprolol 12.5 mg twice daily. - However, at this time NPO. - Will order IV metoprolol. - Continue to monitor BP and adjust meds.  5)Depression and insomnia-  On sertraline, trazodone - Currently NPO.  6)Hypothyroidism- -Continue Synthroid 25 mcg -  Discussed with Pharmacy and changed to IV.  7)Urinary Retention-after insertion of Foley catheter on 02/28/2018 900 mL of clear urine removed, started Flomax on 02/28/18. Because  of worsening abdominal pain, abdominal distention, nausea with vomiting will keep the Foley catheter for now to monitor intake/output.  8)anxiety-patient started on when necessary Vistaril. However, now NPO.  - Monitor. If needed IV Ativan.  DVT prophylaxis:  Subcutaneous heparin due to risk of DVT code Status: Code Disposition Plan:  still nauseous. 2 -3 days  Consults called:  - Gastroenterology - General Surgery   Lab Results  Component Value Date   PLT 319 03/05/2018    Inpatient Medications  Scheduled Meds: . albuterol  2.5 mg Nebulization TID  . cholecalciferol  2,000 Units Oral Daily  . heparin  5,000 Units Subcutaneous Q8H  . iopamidol  30 mL Oral Once  . levothyroxine  25 mcg Oral QAC breakfast  . metoprolol tartrate  12.5 mg Oral BID  . mometasone-formoterol  2 puff Inhalation BID  . pantoprazole  40 mg Oral Daily  . polyethylene glycol  17 g Oral BID  . predniSONE  20 mg Oral Q breakfast   Followed by  . [START ON 03/06/2018] predniSONE  10 mg Oral Q breakfast  . sertraline  50 mg Oral Daily  . tamsulosin  0.4 mg Oral QPC supper   Continuous Infusions: . 0.9 % NaCl with KCl 20 mEq / L 75 mL/hr at 03/05/18 0504  . ciprofloxacin 400 mg (03/05/18 1100)  . metronidazole Stopped (03/05/18 0605)   PRN Meds:.albuterol, benzonatate, dextrose, hydrOXYzine, menthol-cetylpyridinium, morphine injection, ondansetron **OR** ondansetron (ZOFRAN) IV, polyvinyl alcohol, senna-docusate, traMADol, traZODone    Anti-infectives (From admission, onward)   Start     Dose/Rate Route Frequency Ordered Stop   03/04/18 1400  metroNIDAZOLE (FLAGYL) IVPB 500 mg     500 mg 100 mL/hr over 60 Minutes Intravenous Every 8 hours 03/04/18 1211     03/04/18 1300  ciprofloxacin (CIPRO) IVPB 400 mg     400 mg 200 mL/hr over 60 Minutes Intravenous Every 12 hours 03/04/18 1211     02/28/18 0200  piperacillin-tazobactam (ZOSYN) IVPB 3.375 g  Status:  Discontinued     3.375 g 12.5 mL/hr over 240 Minutes Intravenous Every 8 hours 02/27/18 1753 03/04/18 1208   02/27/18 1745  piperacillin-tazobactam (ZOSYN) IVPB 3.375 g     3.375 g 100 mL/hr over 30 Minutes Intravenous STAT 02/27/18 1735 02/28/18 1745   02/27/18 1645  ciprofloxacin (CIPRO) IVPB 400 mg   Status:  Discontinued     400 mg 200 mL/hr over 60 Minutes Intravenous  Once 02/27/18 1643 02/27/18 1734   02/27/18 1645  metroNIDAZOLE (FLAGYL) IVPB 500 mg  Status:  Discontinued     500 mg 100 mL/hr over 60 Minutes Intravenous  Once 02/27/18 1643 02/27/18 1734        Objective:   Vitals:   03/04/18 2021 03/04/18 2148 03/05/18 0408 03/05/18 0715  BP:   (!) 166/82   Pulse: 88  100   Resp: 20  18   Temp:   99.2 F (37.3 C)   TempSrc:   Oral   SpO2:  92% 94% 92%  Weight:   55.7 kg (122 lb 12.7 oz)   Height:        Wt Readings from Last 3 Encounters:  03/05/18 55.7 kg (122 lb 12.7 oz)  02/19/18 47.2 kg (104 lb)  02/19/18 47.4 kg (104 lb 8 oz)     Intake/Output  Summary (Last 24 hours) at 03/05/2018 1130 Last data filed at 03/05/2018 1103 Gross per 24 hour  Intake 2835 ml  Output 2340 ml  Net 495 ml     Physical Exam  Gen:- Awake Alert, mild - moderate distress  HEENT:- [place].AT, No sclera icterus Neck-Supple Neck,No JVD,.  Lungs-  CTAB , fair air movement bilaterally CV- S1, S2 normal Abd- distended, diffusely tender, tinkling bowel sounds Extremity/Skin:- No  edema,   good pedal pulses Psych-affect is appropriate, mostly oriented, some cognitive concerns Neuro-no new focal deficits, no tremors   Data Review:   Micro Results No results found for this or any previous visit (from the past 240 hour(s)).  Radiology Reports Dg Chest 2 View  Result Date: 02/19/2018 CLINICAL DATA:  Shortness of Breath EXAM: CHEST - 2 VIEW COMPARISON:  None. FINDINGS: The heart size and mediastinal contours are within normal limits. Both lungs are clear. The visualized skeletal structures are unremarkable. IMPRESSION: No active cardiopulmonary disease. Electronically Signed   By: Inez Catalina M.D.   On: 02/19/2018 16:08   Ct Abdomen Pelvis W Contrast  Result Date: 03/04/2018 CLINICAL DATA:  Abdominal pain, prior distal colitis EXAM: CT ABDOMEN AND PELVIS WITH CONTRAST TECHNIQUE:  Multidetector CT imaging of the abdomen and pelvis was performed using the standard protocol following bolus administration of intravenous contrast. CONTRAST:  1107mL ISOVUE-300 IOPAMIDOL (ISOVUE-300) INJECTION 61%, 83mL ISOVUE-300 IOPAMIDOL (ISOVUE-300) INJECTION 61% COMPARISON:  02/27/2018 FINDINGS: Despite efforts by the technologist and patient, motion artifact is present on today's exam and could not be eliminated. This reduces exam sensitivity and specificity. Lower chest: Small new bilateral pleural effusions with passive atelectasis. These effusions are nonspecific for transudative or exudative etiology. Borderline wall thickening of the distal thoracic esophagus. Hepatobiliary: Abnormally distended gallbladder, 5.4 cm in diameter. The common bile duct measures 1.2 cm in diameter and previously was only 0.5 cm in diameter. Pancreas: Mild dorsal pancreatic duct dilatation in the pancreatic body and head, increased from prior. Spleen: Unremarkable Adrenals/Urinary Tract: Unremarkable. Foley catheter in the urinary bladder. Stomach/Bowel: No obvious mass in the vicinity of the ampulla The colon is moderately distended with abnormal wall thickening extending from the distal transverse colon through to the sigmoid colon. Mildly accentuated associated enhancement. Air fluid levels in the distal colon compatible with diarrheal process. There is sigmoid colon diverticulosis without appreciable active diverticulitis. Along the anterior wall of the descending colon, there several small patches of relative mucosal hypoenhancement with some exaggerated stranding along the margins, for example on images 94 through 101 of series 6. Small caliber terminal ileum particularly compared to the prominent colon. Small bowel loops are relatively unremarkable and the rectum appears unremarkable. Vascular/Lymphatic: Aortoiliac atherosclerotic vascular disease. Patent celiac trunk, SMA, and inferior mesenteric artery. No pathologic  adenopathy identified. Reproductive: Uterus absent.  Adnexa unremarkable. Other: Scattered moderate ascites. No free intraperitoneal gas is identified. Musculoskeletal: Bilateral flank edema increased from 02/27/2018. There is edema tracking along the upper thigh musculature, right greater than left. IMPRESSION: 1. Abnormal appearance of the colon, once again with distended colon, distal air-fluid levels indicating diarrheal process, and abnormal accentuated enhancement of the colon wall in the descending colon and proximal sigmoid. There are 2 regions of anterior hypoenhancement along the affected colon wall, of uncertain significance. There is sigmoid diverticulosis without active diverticulitis. Overall the appearance is compatible with colitis and could be due to prominent infectious colitis or inflammatory bowel disease such as Crohn's colitis. The central mesenteric vessels are patent making ischemic colitis  less likely but not totally excluded. There is new scattered scattered moderate ascites including ascites around the descending colon along with inflammatory stranding. 2. Along with the ascites, there small bilateral pleural effusions and bilateral flank edema tracking into the upper thighs indicating third spacing of fluids. 3. Newly abnormally distended gallbladder, 5.4 cm in diameter, with new dilatation of the common bile duct and of part of the dorsal pancreatic duct. I do not see an obvious lesion in the vicinity of the ampulla. The possibility of occult choledocholithiasis or occult ampullary lesion is raised. If assessment for acute cholecystitis is warranted, hepatobiliary nuclear medicine scan can be used to assess cystic duct patency. There is no current significant intrahepatic biliary dilatation. 4.  Aortic Atherosclerosis (ICD10-I70.0). Electronically Signed   By: Van Clines M.D.   On: 03/04/2018 17:04   Ct Abdomen Pelvis W Contrast  Result Date: 02/27/2018 CLINICAL DATA:   Generalized abdominal pain with nausea.  Vomiting. EXAM: CT ABDOMEN AND PELVIS WITH CONTRAST TECHNIQUE: Multidetector CT imaging of the abdomen and pelvis was performed using the standard protocol following bolus administration of intravenous contrast. CONTRAST:  39mL ISOVUE-300 IOPAMIDOL (ISOVUE-300) INJECTION 61% COMPARISON:  November 17, 2015 FINDINGS: Lower chest: Mild opacity in the right base dependently is favored to represent atelectasis. Emphysematous changes in the lung bases. Fluid in the distal esophagus. Coronary artery calcifications. Lung bases otherwise normal. Hepatobiliary: Hepatic steatosis. No other acute liver abnormalities identified. The gallbladder is mildly distended but demonstrates no wall thickening, pericholecystic fluid, or stones. The portal vein is patent. Pancreas: Unremarkable. No pancreatic ductal dilatation or surrounding inflammatory changes. Spleen: Normal in size without focal abnormality. Adrenals/Urinary Tract: The adrenal glands are normal. A few tiny renal cysts are noted. The kidneys are otherwise normal. The ureters and bladder are normal. Stomach/Bowel: The stomach and small bowel are normal. Colonic diverticuli are identified. There is mucosal thickening associated with the colon, most prominent in the descending colon and proximal sigmoid colon. There is also mild wall thickening in the proximal descending colon and proximal sigmoid colon with mild adjacent stranding. There is also stranding in the left pericolic gutter, likely secondary to the colonic process. Proximal to the splenic flexure, the colon is fluid-filled, thin walled, and mildly prominent in caliber measuring up to 6 cm. The appendix is not visualized. No secondary evidence of appendicitis. The terminal ileum is normal. Vascular/Lymphatic: Atherosclerotic changes are seen in the nonaneurysmal aorta, iliac vessels common femoral vessels. No adenopathy. Reproductive: Status post hysterectomy. No adnexal  masses. Other: No abdominal wall hernia or abnormality. No abdominopelvic ascites. Musculoskeletal: There is scalloping of the inferior endplate of L3 which is new since December 2016 but does not appear to be acute. The bones are otherwise normal. IMPRESSION: 1. Mucosal enhancement and mild wall thickening associated with the descending and sigmoid colon is consistent with colitis. This could be infectious, inflammatory, or the ischemic. That being said, the IMA and SMA both enhance normally. Recommend clinical correlation and attention on follow-up. 2. Proximal to the splenic flexure, the colon is borderline in caliber and fluid-filled suggesting ileus or mild relative low-grade obstruction from the more distal colitis. No small bowel dilatation. 3. Mild opacity in the right lung base is favored to represent atelectasis rather than pneumonia. 4. Fluid in the esophagus consistent with reflux. 5. Emphysematous changes in the lung bases. 6. Atherosclerosis in the aorta. 7. Scalloping of the inferior endplate of L3, new since 2016. I suspect this is nonacute based on appearance. Electronically  Signed   By: Dorise Bullion III M.D   On: 02/27/2018 15:06   Dg Abd Acute W/chest  Result Date: 03/02/2018 CLINICAL DATA:  Nausea and vomiting EXAM: DG ABDOMEN ACUTE W/ 1V CHEST COMPARISON:  Abdominal and pelvic CT scan of February 27, 2018 and chest x-ray of February 19, 2018. FINDINGS: The lungs are adequately inflated. There is a small amount of pleural fluid blunting the right lateral costophrenic angle. There is no alveolar infiltrate. The heart and pulmonary vascularity are normal. There is calcification in the wall of the aortic arch. The bony thorax exhibits no acute abnormality. Within the abdomen the colonic and rectal stool burden is moderate. There are a few loops of mildly distended gas-filled small bowel in the mid and lower abdomen. The bony structures are unremarkable. There are no abnormal soft tissue  calcification. IMPRESSION: No bowel obstruction nor ileus. Mild gaseous distention of several small bowel loops may reflect a mild ileus. Moderate colonic stool burden but no fecal impaction. Small right pleural effusion new since the previous study. No focal pneumonia underlying COPD. Thoracic aortic atherosclerosis. Electronically Signed   By: David  Martinique M.D.   On: 03/02/2018 09:09     CBC Recent Labs  Lab 02/27/18 1108  02/28/18 0426 03/01/18 0510 03/02/18 0645 03/03/18 0553 03/05/18 0521  WBC 33.0*   < > 29.8* 23.6* 18.8* 13.7* 18.2*  HGB 14.6   < > 12.4 10.7* 11.1* 9.6* 13.0  HCT 45.8   < > 40.0 35.1* 36.1 31.2* 40.8  PLT 773*   < > 485* 365 348 285 319  MCV 87.2   < > 87.5 87.5 87.4 88.6 85.4  MCH 27.8   < > 27.1 26.7 26.9 27.3 27.2  MCHC 31.9   < > 31.0 30.5 30.7 30.8 31.9  RDW 16.1*   < > 16.5* 16.5* 16.2* 16.4* 16.0*  LYMPHSABS 1.7  --   --   --   --   --  1.9  MONOABS 4.0*  --   --   --   --   --  3.0  EOSABS 0.0  --   --   --   --   --  0.0  BASOSABS 0.0  --   --   --   --   --  0.1   < > = values in this interval not displayed.    Chemistries  Recent Labs  Lab 02/27/18 1108  02/28/18 0426 03/01/18 0510 03/02/18 0645 03/03/18 0553 03/04/18 0529 03/05/18 0521  NA 140  --  136 136 140 140 139 134*  K 3.1*  --  4.2 3.6 3.9 3.5 3.4* 3.1*  CL 98*  --  102 101 102 103 103 101  CO2 27  --  26 25 28 26 25 22   GLUCOSE 123*  --  83 103* 82 86 87 94  BUN 35*  --  24* 14 8 10 8  <5*  CREATININE 1.16*   < > 0.88 0.70 0.70 0.68 0.68 0.48  CALCIUM 8.1*  --  7.2* 7.5* 8.4* 8.1* 7.7* 7.1*  MG  --   --   --   --   --  2.2  --   --   AST 27  --  17  --   --  10*  --  25  ALT 16  --  10*  --   --  10*  --  11*  ALKPHOS 101  --  104  --   --  96  --  106  BILITOT 0.3  --  1.0  --   --  0.5  --  0.5   < > = values in this interval not displayed.   ------------------------------------------------------------------------------------------------------------------ No results  for input(s): CHOL, HDL, LDLCALC, TRIG, CHOLHDL, LDLDIRECT in the last 72 hours.  No results found for: HGBA1C ------------------------------------------------------------------------------------------------------------------ No results for input(s): TSH, T4TOTAL, T3FREE, THYROIDAB in the last 72 hours.  Invalid input(s): FREET3 ------------------------------------------------------------------------------------------------------------------ No results for input(s): VITAMINB12, FOLATE, FERRITIN, TIBC, IRON, RETICCTPCT in the last 72 hours.  Coagulation profile No results for input(s): INR, PROTIME in the last 168 hours.  No results for input(s): DDIMER in the last 72 hours.  Cardiac Enzymes No results for input(s): CKMB, TROPONINI, MYOGLOBIN in the last 168 hours.  Invalid input(s): CK ------------------------------------------------------------------------------------------------------------------    Component Value Date/Time   BNP 128.7 (H) 01/28/2018 1422     Chevelle Durr M.D on 03/05/2018 at 11:30 AM     After 7pm go to [email or web address] - password [email or web address]  [facility] -  Office  [phone]   Voice Recognition Viviann Spare dictation system was used to create this note, attempts have been made to correct errors. Please contact the author with questions and/or clarifications.

## 2018-03-05 NOTE — Plan of Care (Signed)
Plan of care discussed. Accepting and compliant with interventions. Better pain control.

## 2018-03-05 NOTE — Plan of Care (Signed)
  Problem: Elimination: Goal: Will not experience complications related to bowel motility Outcome: Progressing   Problem: Pain Managment: Goal: General experience of comfort will improve Outcome: Progressing   Problem: Activity: Goal: Risk for activity intolerance will decrease Outcome: Progressing

## 2018-03-06 DIAGNOSIS — E039 Hypothyroidism, unspecified: Secondary | ICD-10-CM

## 2018-03-06 DIAGNOSIS — I10 Essential (primary) hypertension: Secondary | ICD-10-CM

## 2018-03-06 DIAGNOSIS — K56609 Unspecified intestinal obstruction, unspecified as to partial versus complete obstruction: Secondary | ICD-10-CM

## 2018-03-06 LAB — CBC WITH DIFFERENTIAL/PLATELET
Basophils Absolute: 0 10*3/uL (ref 0.0–0.1)
Basophils Relative: 0 %
Eosinophils Absolute: 0 10*3/uL (ref 0.0–0.7)
Eosinophils Relative: 0 %
HCT: 37 % (ref 36.0–46.0)
Hemoglobin: 11.5 g/dL — ABNORMAL LOW (ref 12.0–15.0)
Lymphocytes Relative: 17 %
Lymphs Abs: 2.4 10*3/uL (ref 0.7–4.0)
MCH: 26.3 pg (ref 26.0–34.0)
MCHC: 31.1 g/dL (ref 30.0–36.0)
MCV: 84.5 fL (ref 78.0–100.0)
Monocytes Absolute: 1.9 10*3/uL — ABNORMAL HIGH (ref 0.1–1.0)
Monocytes Relative: 13 %
Neutro Abs: 10 10*3/uL — ABNORMAL HIGH (ref 1.7–7.7)
Neutrophils Relative %: 70 %
Platelets: 320 10*3/uL (ref 150–400)
RBC: 4.38 MIL/uL (ref 3.87–5.11)
RDW: 16 % — ABNORMAL HIGH (ref 11.5–15.5)
WBC: 14.3 10*3/uL — ABNORMAL HIGH (ref 4.0–10.5)

## 2018-03-06 LAB — GLUCOSE, CAPILLARY
Glucose-Capillary: 102 mg/dL — ABNORMAL HIGH (ref 65–99)
Glucose-Capillary: 71 mg/dL (ref 65–99)
Glucose-Capillary: 70 mg/dL (ref 65–99)
Glucose-Capillary: 85 mg/dL (ref 65–99)
Glucose-Capillary: 54 mg/dL — ABNORMAL LOW (ref 65–99)
Glucose-Capillary: 84 mg/dL (ref 65–99)

## 2018-03-06 LAB — C DIFFICILE QUICK SCREEN W PCR REFLEX
C Diff antigen: NEGATIVE
C Diff interpretation: NOT DETECTED
C Diff toxin: NEGATIVE

## 2018-03-06 LAB — COMPREHENSIVE METABOLIC PANEL
ALT: 12 U/L — ABNORMAL LOW (ref 14–54)
AST: 18 U/L (ref 15–41)
Albumin: 2 g/dL — ABNORMAL LOW (ref 3.5–5.0)
Alkaline Phosphatase: 91 U/L (ref 38–126)
Anion gap: 8 (ref 5–15)
BUN: 6 mg/dL (ref 6–20)
CO2: 26 mmol/L (ref 22–32)
Calcium: 7.6 mg/dL — ABNORMAL LOW (ref 8.9–10.3)
Chloride: 103 mmol/L (ref 101–111)
Creatinine, Ser: 0.56 mg/dL (ref 0.44–1.00)
GFR calc Af Amer: >60 mL/min (ref >60)
GFR calc non Af Amer: >60 mL/min (ref >60)
Glucose, Bld: 88 mg/dL (ref 65–99)
Potassium: 3.5 mmol/L (ref 3.5–5.1)
Sodium: 137 mmol/L (ref 135–145)
Total Bilirubin: 0.5 mg/dL (ref 0.3–1.2)
Total Protein: 4.4 g/dL — ABNORMAL LOW (ref 6.5–8.1)

## 2018-03-06 LAB — GASTROINTESTINAL PANEL BY PCR, STOOL (REPLACES STOOL CULTURE)

## 2018-03-06 MED ORDER — PHENOL 1.4 % MT LIQD
1.0000 | OROMUCOSAL | Status: DC | PRN
  Administered 2018-03-06: 21:00:00 1 via OROMUCOSAL
  Filled 2018-03-06: qty 177

## 2018-03-06 MED ORDER — POTASSIUM CHLORIDE 10 MEQ/100ML IV SOLN
INTRAVENOUS | Status: AC
  Administered 2018-03-06: 10 meq via INTRAVENOUS
  Filled 2018-03-06: qty 100

## 2018-03-06 MED ORDER — PANTOPRAZOLE SODIUM 40 MG IV SOLR
40.0000 mg | INTRAVENOUS | Status: DC
  Administered 2018-03-06 – 2018-03-11 (×6): 40 mg via INTRAVENOUS
  Filled 2018-03-06 (×6): qty 40

## 2018-03-06 NOTE — Progress Notes (Signed)
C-diff returned negative, notified Dr Karleen Hampshire. Enteric precautions discontinued. Heather [name], [employer]

## 2018-03-06 NOTE — Progress Notes (Signed)
[facility] Gastroenterology Progress Note  Heather [name] 83 y.o. June 02, 1935   Subjective: Complaining of abdominal pain. NG tube in place with dark bilious drainage. Diarrhea persisting.  Objective: Vital signs: Vitals:   03/06/18 0809 03/06/18 1347  BP:  (!) 151/58  Pulse:  91  Resp:  16  Temp:  97.8 F (36.6 C)  SpO2: 95% 96%    Physical Exam: Gen: lethargic, elderly, mild acute distress  HEENT: anicteric sclera CV: RRR Chest: CTA B Abd: +distention, diffuse tenderness with guarding, +BS Ext: no edema  Lab Results: Recent Labs    03/05/18 0521 03/06/18 0531  NA 134* 137  K 3.1* 3.5  CL 101 103  CO2 22 26  GLUCOSE 94 88  BUN <5* 6  CREATININE 0.48 0.56  CALCIUM 7.1* 7.6*   Recent Labs    03/05/18 0521 03/06/18 0531  AST 25 18  ALT 11* 12*  ALKPHOS 106 91  BILITOT 0.5 0.5  PROT 4.9* 4.4*  ALBUMIN 2.1* 2.0*   Recent Labs    03/05/18 0521 03/06/18 0531  WBC 18.2* 14.3*  NEUTROABS 13.2 10.0*  HGB 13.0 11.5*  HCT 40.8 37.0  MCV 85.4 84.5  PLT 319 320      Assessment/Plan: Diffuse abdominal pain with colitis on CT. GI pathogen panel pending. HIDA scan negative for cholecystitis. Source of colitis unclear but await GI pathogen panel. Distention concerning. WBC improving. If GI pathogen panel negative then will need a flex sig if diarrhea persisting. Continue supportive care.  [place]. 03/06/2018, 1:49 PM  Pager [phone]  AFTER 5 PM or on weekends please call [phone] ID: Heather [name], female   DOB: 01-03-1935, 83 y.o.   MRN: [id number]

## 2018-03-06 NOTE — Progress Notes (Signed)
[facility] Office:  [phone] General Surgery Progress Note   LOS: 7 days  POD -     Chief Complaint: Abdominal pain  Assessment and Plan: 1. Gall bladder - normal HIDA scan - no cholecystitis  2.  Colitis (last CT scan 03/04/2018)  WBC - 14,300 - 03/06/2018  Cipro/Flagyl  Has NGT - some loose stools (though she is on Miralax)   3.  COPD/emphysema 4.  HTN 5.  Anxiety/depression 6.  On prednisone 7.   DVT prophylaxis - SQ Heparin 8.  On isolation - though not entirely sure why - will order C Diff and, if negative, can get her off isolation (she's getting Miralax    Principal Problem:   Colitis Active Problems:   Hypothyroidism   Dyslipidemia   Anxiety and depression   Essential hypertension   COPD with emphysema (HCC)   Malnutrition of moderate degree   Hypokalemia   Abdominal pain   SBO (small bowel obstruction) (HCC)   Dehydration   Subjective:  Less abdominal pain, but still distended    Has NGT and foley  Objective:   Vitals:   03/05/18 2157 03/06/18 0633  BP: (!) 141/69 (!) 107/59  Pulse: 92 86  Resp: 16 16  Temp: 98.4 F (36.9 C) 98 F (36.7 C)  SpO2: 95% 95%     Intake/Output from previous day:  03/29 0701 - 03/30 0700 In: 1806.3 [I.V.:1146.3; NG/GT:60; IV Piggyback:600] Out: 0981 [Urine:1540; Emesis/NG output:500; XBJYN:8295]  Intake/Output this shift:  No intake/output data recorded.   Physical Exam:   General: Older sickly F who is alert and oriented.    HEENT: Normal. Pupils equal. .   Lungs: Clear   Abdomen: Distended and tender in lower abdomen.  No peritoneal sxes.  Few BS.   Lab Results:    Recent Labs    03/05/18 0521 03/06/18 0531  WBC 18.2* 14.3*  HGB 13.0 11.5*  HCT 40.8 37.0  PLT 319 320    BMET   Recent Labs    03/05/18 0521 03/06/18 0531  NA 134* 137  K 3.1* 3.5  CL 101 103  CO2 22 26  GLUCOSE 94 88  BUN <5* 6  CREATININE 0.48 0.56  CALCIUM 7.1* 7.6*    PT/INR  No results for input(s):  LABPROT, INR in the last 72 hours.  ABG  No results for input(s): PHART, HCO3 in the last 72 hours.  Invalid input(s): PCO2, PO2   Studies/Results:  Nm Hepatobiliary Liver Func  Result Date: 03/05/2018 CLINICAL DATA:  Abdominal pain with nausea and vomiting. Distended gallbladder. EXAM: NUCLEAR MEDICINE HEPATOBILIARY IMAGING TECHNIQUE: Sequential images of the abdomen were obtained out to 60 minutes following intravenous administration of radiopharmaceutical. RADIOPHARMACEUTICALS:  5.19 mCi Tc-39m  Choletec IV COMPARISON:  CT abdomen and pelvis March 04, 2018 FINDINGS: Liver uptake of radiotracer is unremarkable. There is visualization of gallbladder and small bowel, indicating patency of the cystic and common bile ducts. The patient was unable to tolerate Ensure consumption to facilitate calculation of the computer generated ejection fraction of radiotracer from the gallbladder. IMPRESSION: Cystic and common bile ducts are patent, evidenced by visualization of gallbladder and small bowel. Gallbladder is noted to be distended on recent CT. Patient unable to tolerate Ensure to facilitate calculation of the computer generated ejection fraction of radiotracer from gallbladder. Electronically Signed   By: Lowella Grip III M.D.   On: 03/05/2018 16:53   Ct Abdomen Pelvis W Contrast  Result Date: 03/04/2018 CLINICAL DATA:  Abdominal pain, prior distal colitis EXAM: CT ABDOMEN AND PELVIS WITH CONTRAST TECHNIQUE: Multidetector CT imaging of the abdomen and pelvis was performed using the standard protocol following bolus administration of intravenous contrast. CONTRAST:  134mL ISOVUE-300 IOPAMIDOL (ISOVUE-300) INJECTION 61%, 26mL ISOVUE-300 IOPAMIDOL (ISOVUE-300) INJECTION 61% COMPARISON:  02/27/2018 FINDINGS: Despite efforts by the technologist and patient, motion artifact is present on today's exam and could not be eliminated. This reduces exam sensitivity and specificity. Lower chest: Small new bilateral  pleural effusions with passive atelectasis. These effusions are nonspecific for transudative or exudative etiology. Borderline wall thickening of the distal thoracic esophagus. Hepatobiliary: Abnormally distended gallbladder, 5.4 cm in diameter. The common bile duct measures 1.2 cm in diameter and previously was only 0.5 cm in diameter. Pancreas: Mild dorsal pancreatic duct dilatation in the pancreatic body and head, increased from prior. Spleen: Unremarkable Adrenals/Urinary Tract: Unremarkable. Foley catheter in the urinary bladder. Stomach/Bowel: No obvious mass in the vicinity of the ampulla The colon is moderately distended with abnormal wall thickening extending from the distal transverse colon through to the sigmoid colon. Mildly accentuated associated enhancement. Air fluid levels in the distal colon compatible with diarrheal process. There is sigmoid colon diverticulosis without appreciable active diverticulitis. Along the anterior wall of the descending colon, there several small patches of relative mucosal hypoenhancement with some exaggerated stranding along the margins, for example on images 94 through 101 of series 6. Small caliber terminal ileum particularly compared to the prominent colon. Small bowel loops are relatively unremarkable and the rectum appears unremarkable. Vascular/Lymphatic: Aortoiliac atherosclerotic vascular disease. Patent celiac trunk, SMA, and inferior mesenteric artery. No pathologic adenopathy identified. Reproductive: Uterus absent.  Adnexa unremarkable. Other: Scattered moderate ascites. No free intraperitoneal gas is identified. Musculoskeletal: Bilateral flank edema increased from 02/27/2018. There is edema tracking along the upper thigh musculature, right greater than left. IMPRESSION: 1. Abnormal appearance of the colon, once again with distended colon, distal air-fluid levels indicating diarrheal process, and abnormal accentuated enhancement of the colon wall in the  descending colon and proximal sigmoid. There are 2 regions of anterior hypoenhancement along the affected colon wall, of uncertain significance. There is sigmoid diverticulosis without active diverticulitis. Overall the appearance is compatible with colitis and could be due to prominent infectious colitis or inflammatory bowel disease such as Crohn's colitis. The central mesenteric vessels are patent making ischemic colitis less likely but not totally excluded. There is new scattered scattered moderate ascites including ascites around the descending colon along with inflammatory stranding. 2. Along with the ascites, there small bilateral pleural effusions and bilateral flank edema tracking into the upper thighs indicating third spacing of fluids. 3. Newly abnormally distended gallbladder, 5.4 cm in diameter, with new dilatation of the common bile duct and of part of the dorsal pancreatic duct. I do not see an obvious lesion in the vicinity of the ampulla. The possibility of occult choledocholithiasis or occult ampullary lesion is raised. If assessment for acute cholecystitis is warranted, hepatobiliary nuclear medicine scan can be used to assess cystic duct patency. There is no current significant intrahepatic biliary dilatation. 4.  Aortic Atherosclerosis (ICD10-I70.0). Electronically Signed   By: Van Clines M.D.   On: 03/04/2018 17:04     Anti-infectives:   Anti-infectives (From admission, onward)   Start     Dose/Rate Route Frequency Ordered Stop   03/04/18 1400  metroNIDAZOLE (FLAGYL) IVPB 500 mg     500 mg 100 mL/hr over 60 Minutes Intravenous Every 8 hours 03/04/18 1211  03/04/18 1300  ciprofloxacin (CIPRO) IVPB 400 mg     400 mg 200 mL/hr over 60 Minutes Intravenous Every 12 hours 03/04/18 1211     02/28/18 0200  piperacillin-tazobactam (ZOSYN) IVPB 3.375 g  Status:  Discontinued     3.375 g 12.5 mL/hr over 240 Minutes Intravenous Every 8 hours 02/27/18 1753 03/04/18 1208   02/27/18  1745  piperacillin-tazobactam (ZOSYN) IVPB 3.375 g     3.375 g 100 mL/hr over 30 Minutes Intravenous STAT 02/27/18 1735 02/28/18 1745   02/27/18 1645  ciprofloxacin (CIPRO) IVPB 400 mg  Status:  Discontinued     400 mg 200 mL/hr over 60 Minutes Intravenous  Once 02/27/18 1643 02/27/18 1734   02/27/18 1645  metroNIDAZOLE (FLAGYL) IVPB 500 mg  Status:  Discontinued     500 mg 100 mL/hr over 60 Minutes Intravenous  Once 02/27/18 1643 02/27/18 1734      Alphonsa Overall, MD, FACS Pager: [facility] Office: [phone] 03/06/2018

## 2018-03-06 NOTE — Plan of Care (Signed)
Patient with multiple stools this evening.

## 2018-03-06 NOTE — Progress Notes (Signed)
PROGRESS NOTE    Heather [name]  ZCH:[id number] DOB: [birth year] DOA: 02/27/2018 PCP: [name], MD    Brief Narrative:  83 year old who was discharged on 02/22/2018 after treatment for acute hypoxic respiratory failure secondary to COPD exacerbation who readmitted on 02/27/2018 with abdominal pain nausea and vomiting and findings suggestive of colitis.  Patient has a history of chronic constipation typically,. Patient also found to have a mild ileus or low-grade bowel obstruction from distal colitis.   Assessment & Plan:   Principal Problem:   Colitis Active Problems:   Hypothyroidism   Dyslipidemia   Anxiety and depression   Essential hypertension   COPD with emphysema (HCC)   Malnutrition of moderate degree   Hypokalemia   Abdominal pain   SBO (small bowel obstruction) (HCC)   Dehydration   Diffuse Colitis with SBO : CT abd shows some dilatation of the CBD with unclear etiology.  HIDA scan not sig for acute cholecystitis.  Surgery and GI  on board On NG tube and on suction. Afebrile, , persistent leukocytosis.  c diff pcr is negative and gi pathogen pcr was pending.  Currently on IV ciprofloxacin and IV flagyl.  IV fluids stopped because of third spacing and ascites, pleural effusions.   AKI: Resolved.    Hypertension:  Well controlled.    Hypoalbuminemia:  Third spacing with ascites, pleural effusions.   Hypothyroidism: Resume synthroid.       DVT prophylaxis: HEPARIN Code Status: FULL CODE.  Family Communication: NONE AT BEDSIDE.  Disposition Plan: pending resolution with SBO.    Consultants:   Surgery  Gastroenterology.    Procedures: nONE.    Antimicrobials: ciprofloxacin and flagyl.    Subjective: Reports abdominal pain and discomfort.   Objective: Vitals:   03/05/18 2157 03/06/18 0633 03/06/18 0809 03/06/18 1347  BP: (!) 141/69 (!) 107/59  (!) 151/58  Pulse: 92 86  91  Resp: 16 16  16   Temp: 98.4 F (36.9 C) 98  F (36.7 C)  97.8 F (36.6 C)  TempSrc: Oral Oral  Oral  SpO2: 95% 95% 95% 96%  Weight:      Height:        Intake/Output Summary (Last 24 hours) at 03/06/2018 1654 Last data filed at 03/06/2018 1455 Gross per 24 hour  Intake 2433.75 ml  Output 3426 ml  Net -992.25 ml   Filed Weights   03/03/18 0510 03/04/18 0500 03/05/18 0408  Weight: 54.3 kg (119 lb 11.4 oz) 53.9 kg (118 lb 13.3 oz) 55.7 kg (122 lb 12.7 oz)    Examination:  General exam: Appears to be in mild distress.  Respiratory system: Clear to auscultation. Respiratory effort normal. Cardiovascular system: S1 & S2 heard, RRR. No JVD, murmurs,. Trace pedal edema. Gastrointestinal system: abd is distended, soft, bowel sounds diminished. Tender generalized.  Central nervous system: Alert and oriented. Non focal.  Extremities: trace edema.  Skin: No rashes, lesions or ulcers Psychiatry: anxious about the NG tube.     Data Reviewed: I have personally reviewed following labs and imaging studies  CBC: Recent Labs  Lab 03/01/18 0510 03/02/18 0645 03/03/18 0553 03/05/18 0521 03/06/18 0531  WBC 23.6* 18.8* 13.7* 18.2* 14.3*  NEUTROABS  --   --   --  13.2 10.0*  HGB 10.7* 11.1* 9.6* 13.0 11.5*  HCT 35.1* 36.1 31.2* 40.8 37.0  MCV 87.5 87.4 88.6 85.4 84.5  PLT 365 348 285 319 287   Basic Metabolic Panel: Recent Labs  Lab 03/02/18 0645 03/03/18  2423 03/04/18 0529 03/05/18 0521 03/06/18 0531  NA 140 140 139 134* 137  K 3.9 3.5 3.4* 3.1* 3.5  CL 102 103 103 101 103  CO2 28 26 25 22 26   GLUCOSE 82 86 87 94 88  BUN 8 10 8  <5* 6  CREATININE 0.70 0.68 0.68 0.48 0.56  CALCIUM 8.4* 8.1* 7.7* 7.1* 7.6*  MG  --  2.2  --   --   --    GFR: Estimated Creatinine Clearance: 42.7 mL/min (by C-G formula based on SCr of 0.56 mg/dL). Liver Function Tests: Recent Labs  Lab 02/28/18 0426 03/03/18 0553 03/05/18 0521 03/06/18 0531  AST 17 10* 25 18  ALT 10* 10* 11* 12*  ALKPHOS 104 96 106 91  BILITOT 1.0 0.5 0.5 0.5    PROT 4.8* 4.9* 4.9* 4.4*  ALBUMIN 2.2* 2.1* 2.1* 2.0*   No results for input(s): LIPASE, AMYLASE in the last 168 hours. No results for input(s): AMMONIA in the last 168 hours. Coagulation Profile: No results for input(s): INR, PROTIME in the last 168 hours. Cardiac Enzymes: No results for input(s): CKTOTAL, CKMB, CKMBINDEX, TROPONINI in the last 168 hours. BNP (last 3 results) No results for input(s): PROBNP in the last 8760 hours. HbA1C: No results for input(s): HGBA1C in the last 72 hours. CBG: Recent Labs  Lab 03/05/18 2353 03/06/18 0353 03/06/18 0759 03/06/18 1200 03/06/18 1615  GLUCAP 88 102* 71 70 85   Lipid Profile: No results for input(s): CHOL, HDL, LDLCALC, TRIG, CHOLHDL, LDLDIRECT in the last 72 hours. Thyroid Function Tests: No results for input(s): TSH, T4TOTAL, FREET4, T3FREE, THYROIDAB in the last 72 hours. Anemia Panel: No results for input(s): VITAMINB12, FOLATE, FERRITIN, TIBC, IRON, RETICCTPCT in the last 72 hours. Sepsis Labs: No results for input(s): PROCALCITON, LATICACIDVEN in the last 168 hours.  Recent Results (from the past 240 hour(s))  C difficile quick scan w PCR reflex     Status: None   Collection Time: 03/06/18  7:42 AM  Result Value Ref Range Status   C Diff antigen NEGATIVE NEGATIVE Final   C Diff toxin NEGATIVE NEGATIVE Final   C Diff interpretation No C. difficile detected.  Final    Comment: Performed at [facility], [place] [address], [place], [place] [address]         Radiology Studies: Nm Hepatobiliary Liver Func  Result Date: 03/05/2018 CLINICAL DATA:  Abdominal pain with nausea and vomiting. Distended gallbladder. EXAM: NUCLEAR MEDICINE HEPATOBILIARY IMAGING TECHNIQUE: Sequential images of the abdomen were obtained out to 60 minutes following intravenous administration of radiopharmaceutical. RADIOPHARMACEUTICALS:  5.19 mCi Tc-27m  Choletec IV COMPARISON:  CT abdomen and pelvis March 04, 2018 FINDINGS: Liver  uptake of radiotracer is unremarkable. There is visualization of gallbladder and small bowel, indicating patency of the cystic and common bile ducts. The patient was unable to tolerate Ensure consumption to facilitate calculation of the computer generated ejection fraction of radiotracer from the gallbladder. IMPRESSION: Cystic and common bile ducts are patent, evidenced by visualization of gallbladder and small bowel. Gallbladder is noted to be distended on recent CT. Patient unable to tolerate Ensure to facilitate calculation of the computer generated ejection fraction of radiotracer from gallbladder. Electronically Signed   By: Lowella Grip III M.D.   On: 03/05/2018 16:53        Scheduled Meds: . cholecalciferol  2,000 Units Oral Daily  . heparin  5,000 Units Subcutaneous Q8H  . iopamidol  30 mL Oral Once  . levothyroxine  12.5 mcg Intravenous Daily  . metoprolol tartrate  5 mg Intravenous Q8H  . mometasone-formoterol  2 puff Inhalation BID  . pantoprazole (PROTONIX) IV  40 mg Intravenous Q24H  . sertraline  50 mg Oral Daily  . tamsulosin  0.4 mg Oral QPC supper   Continuous Infusions: . ciprofloxacin Stopped (03/06/18 1455)  . metronidazole Stopped (03/06/18 1455)     LOS: 7 days    Time spent: 35 minutes.     Hosie Poisson, MD [facility] Pager [phone]  If 7PM-7AM, please contact night-coverage [email or web address] Password [facility] 03/06/2018, 4:54 PM

## 2018-03-07 ENCOUNTER — Inpatient Hospital Stay (HOSPITAL_COMMUNITY): Payer: Medicare Other

## 2018-03-07 LAB — CBC
HCT: 33.5 % — ABNORMAL LOW (ref 36.0–46.0)
Hemoglobin: 10.7 g/dL — ABNORMAL LOW (ref 12.0–15.0)
MCH: 26.5 pg (ref 26.0–34.0)
MCHC: 31.9 g/dL (ref 30.0–36.0)
MCV: 82.9 fL (ref 78.0–100.0)
Platelets: 257 10*3/uL (ref 150–400)
RBC: 4.04 MIL/uL (ref 3.87–5.11)
RDW: 16.3 % — ABNORMAL HIGH (ref 11.5–15.5)
WBC: 14.1 10*3/uL — ABNORMAL HIGH (ref 4.0–10.5)

## 2018-03-07 LAB — GLUCOSE, CAPILLARY
Glucose-Capillary: 144 mg/dL — ABNORMAL HIGH (ref 65–99)
Glucose-Capillary: 68 mg/dL (ref 65–99)
Glucose-Capillary: 79 mg/dL (ref 65–99)
Glucose-Capillary: 88 mg/dL (ref 65–99)
Glucose-Capillary: 91 mg/dL (ref 65–99)
Glucose-Capillary: 95 mg/dL (ref 65–99)

## 2018-03-07 LAB — BASIC METABOLIC PANEL
Anion gap: 9 (ref 5–15)
BUN: <5 mg/dL — ABNORMAL LOW (ref 6–20)
CO2: 26 mmol/L (ref 22–32)
Calcium: 7.7 mg/dL — ABNORMAL LOW (ref 8.9–10.3)
Chloride: 101 mmol/L (ref 101–111)
Creatinine, Ser: 0.45 mg/dL (ref 0.44–1.00)
GFR calc Af Amer: >60 mL/min (ref >60)
GFR calc non Af Amer: >60 mL/min (ref >60)
Glucose, Bld: 92 mg/dL (ref 65–99)
Potassium: 3.4 mmol/L — ABNORMAL LOW (ref 3.5–5.1)
Sodium: 136 mmol/L (ref 135–145)

## 2018-03-07 MED ORDER — POTASSIUM CHLORIDE 10 MEQ/100ML IV SOLN
10.0000 meq | INTRAVENOUS | Status: AC
  Administered 2018-03-07 (×2): 10 meq via INTRAVENOUS
  Filled 2018-03-07 (×2): qty 100

## 2018-03-07 MED ORDER — HYDROXYZINE HCL 50 MG/ML IM SOLN
25.0000 mg | Freq: Once | INTRAMUSCULAR | Status: DC
  Filled 2018-03-07: qty 0.5

## 2018-03-07 MED ORDER — DEXTROSE 5 % IV SOLN
INTRAVENOUS | Status: DC
  Administered 2018-03-07 – 2018-03-13 (×7): via INTRAVENOUS

## 2018-03-07 NOTE — Progress Notes (Signed)
Patient ID: Heather [name], female   DOB: [birth year], 83 y.o.   MRN: [id number]  Patient in Xray. Stools recorded as mushy. GI pathogen panel and C. Diff negative.  Diarrhea seems to be resolving. Continue supportive care. NG tube remains in place. [facility] will f/u tomorrow.

## 2018-03-07 NOTE — Progress Notes (Signed)
[facility] Office:  [phone] General Surgery Progress Note   LOS: 8 days  POD -     Chief Complaint: Abdominal pain  Assessment and Plan: 1.  Colitis (last CT scan 03/04/2018) (C diff neg)  WBC - 14,100 - 03/07/2018  Cipro/Flagyl  Has NGT (only 300 cc recorded last 24 hours) - still complains of vague abdominal pain, will get KUB to check abdomen  2.  Gall bladder - normal HIDA scan - no cholecystitis   3.  COPD/emphysema 4.  HTN 5.  Anxiety/depression 6.  Prednisone stopped 03/06/2018 7.   DVT prophylaxis - SQ Heparin 8.  Needs to ambulate   Principal Problem:   Colitis Active Problems:   Hypothyroidism   Dyslipidemia   Anxiety and depression   Essential hypertension   COPD with emphysema (HCC)   Malnutrition of moderate degree   Hypokalemia   Abdominal pain   SBO (small bowel obstruction) (HCC)   Dehydration   Subjective:  Still complains of vague abdominal pain, L>R    Has NGT and foley  Objective:   Vitals:   03/06/18 2242 03/07/18 0630  BP: (!) 135/53 (!) 160/62  Pulse: 84 96  Resp: 18 16  Temp: 98.3 F (36.8 C) 98.8 F (37.1 C)  SpO2: 94% 96%     Intake/Output from previous day:  03/30 0701 - 03/31 0700 In: 1914.6 [I.V.:1114.6; IV Piggyback:800] Out: 1625 [Urine:1325; Emesis/NG output:300]  Intake/Output this shift:  No intake/output data recorded.   Physical Exam:   General: Older sickly F who is alert and oriented.    HEENT: Normal. Pupils equal. .   Lungs: Clear   Abdomen: Distended and tender in lower abdomen.  No peritoneal sxes.  Few BS.  NGT - 300 cc recorded yesterday   Lab Results:    Recent Labs    03/06/18 0531 03/07/18 0507  WBC 14.3* 14.1*  HGB 11.5* 10.7*  HCT 37.0 33.5*  PLT 320 257    BMET   Recent Labs    03/06/18 0531 03/07/18 0507  NA 137 136  K 3.5 3.4*  CL 103 101  CO2 26 26  GLUCOSE 88 92  BUN 6 <5*  CREATININE 0.56 0.45  CALCIUM 7.6* 7.7*    PT/INR  No results for input(s):  LABPROT, INR in the last 72 hours.  ABG  No results for input(s): PHART, HCO3 in the last 72 hours.  Invalid input(s): PCO2, PO2   Studies/Results:  Nm Hepatobiliary Liver Func  Result Date: 03/05/2018 CLINICAL DATA:  Abdominal pain with nausea and vomiting. Distended gallbladder. EXAM: NUCLEAR MEDICINE HEPATOBILIARY IMAGING TECHNIQUE: Sequential images of the abdomen were obtained out to 60 minutes following intravenous administration of radiopharmaceutical. RADIOPHARMACEUTICALS:  5.19 mCi Tc-87m  Choletec IV COMPARISON:  CT abdomen and pelvis March 04, 2018 FINDINGS: Liver uptake of radiotracer is unremarkable. There is visualization of gallbladder and small bowel, indicating patency of the cystic and common bile ducts. The patient was unable to tolerate Ensure consumption to facilitate calculation of the computer generated ejection fraction of radiotracer from the gallbladder. IMPRESSION: Cystic and common bile ducts are patent, evidenced by visualization of gallbladder and small bowel. Gallbladder is noted to be distended on recent CT. Patient unable to tolerate Ensure to facilitate calculation of the computer generated ejection fraction of radiotracer from gallbladder. Electronically Signed   By: Lowella Grip III M.D.   On: 03/05/2018 16:53     Anti-infectives:   Anti-infectives (From admission, onward)   Start  Dose/Rate Route Frequency Ordered Stop   03/04/18 1400  metroNIDAZOLE (FLAGYL) IVPB 500 mg     500 mg 100 mL/hr over 60 Minutes Intravenous Every 8 hours 03/04/18 1211     03/04/18 1300  ciprofloxacin (CIPRO) IVPB 400 mg     400 mg 200 mL/hr over 60 Minutes Intravenous Every 12 hours 03/04/18 1211     02/28/18 0200  piperacillin-tazobactam (ZOSYN) IVPB 3.375 g  Status:  Discontinued     3.375 g 12.5 mL/hr over 240 Minutes Intravenous Every 8 hours 02/27/18 1753 03/04/18 1208   02/27/18 1745  piperacillin-tazobactam (ZOSYN) IVPB 3.375 g     3.375 g 100 mL/hr over 30  Minutes Intravenous STAT 02/27/18 1735 02/28/18 1745   02/27/18 1645  ciprofloxacin (CIPRO) IVPB 400 mg  Status:  Discontinued     400 mg 200 mL/hr over 60 Minutes Intravenous  Once 02/27/18 1643 02/27/18 1734   02/27/18 1645  metroNIDAZOLE (FLAGYL) IVPB 500 mg  Status:  Discontinued     500 mg 100 mL/hr over 60 Minutes Intravenous  Once 02/27/18 1643 02/27/18 1734      Alphonsa Overall, MD, FACS Pager: [facility] Office: [phone] 03/07/2018

## 2018-03-07 NOTE — Progress Notes (Signed)
PROGRESS NOTE    Heather [name]  RUE:[id number] DOB: [birth year] DOA: 02/27/2018 PCP: [name], MD    Brief Narrative:  83 year old who was discharged on 02/22/2018 after treatment for acute hypoxic respiratory failure secondary to COPD exacerbation who readmitted on 02/27/2018 with abdominal pain nausea and vomiting and findings suggestive of colitis.  Patient has a history of chronic constipation typically,. Patient also found to have a mild ileus or low-grade bowel obstruction from distal colitis.   Assessment & Plan:   Principal Problem:   Colitis Active Problems:   Hypothyroidism   Dyslipidemia   Anxiety and depression   Essential hypertension   COPD with emphysema (HCC)   Malnutrition of moderate degree   Hypokalemia   Abdominal pain   SBO (small bowel obstruction) (HCC)   Dehydration   Diffuse Colitis with SBO : CT abd shows some dilatation of the CBD with unclear etiology.  HIDA scan not sig for acute cholecystitis.  Surgery and GI  on board On NG tube and on suction. Afebrile, , persistent leukocytosis.  c diff pcr is negative and gi pathogen pcr is negative.  Currently on IV ciprofloxacin and IV flagyl. Continue the same.  IV fluids stopped because of third spacing and ascites, pleural effusions.  Repeat ABD film does nto show any obstruction.   AKI: Resolved.    Hypertension:  Well controlled.    Hypoalbuminemia:  Third spacing with ascites, pleural effusions.   Hypothyroidism: Resume synthroid.    Hypokalemia: replaced.    DVT prophylaxis: HEPARIN Code Status: FULL CODE.  Family Communication: NONE AT BEDSIDE.  Disposition Plan: pending resolution of colitis.     Consultants:   Surgery  Gastroenterology.    Procedures: nONE.    Antimicrobials: ciprofloxacin and flagyl.    Subjective: abd pain is better.  Diarrhea is better.  No vomiting.   Objective: Vitals:   03/07/18 0500 03/07/18 0630 03/07/18 0835 03/07/18  1400  BP:  (!) 160/62  (!) 148/61  Pulse:  96  67  Resp:  16  14  Temp:  98.8 F (37.1 C)  98.9 F (37.2 C)  TempSrc:  Oral  Oral  SpO2:  96% 95% 97%  Weight: 55.5 kg (122 lb 5.7 oz)     Height:        Intake/Output Summary (Last 24 hours) at 03/07/2018 1616 Last data filed at 03/07/2018 1349 Gross per 24 hour  Intake 1161.67 ml  Output 1975 ml  Net -813.33 ml   Filed Weights   03/04/18 0500 03/05/18 0408 03/07/18 0500  Weight: 53.9 kg (118 lb 13.3 oz) 55.7 kg (122 lb 12.7 oz) 55.5 kg (122 lb 5.7 oz)    Examination:  General exam: calm and comfortable.  Respiratory system: Clear, no wheezing or rhonchi.  Cardiovascular system: S1 & S2 heard, RRR. No JVD, murmurs,. Trace pedal edema. Gastrointestinal system: abd is soft non tender non distended bowel sounds heard.  Central nervous system: Alert and oriented. Non focal.  Extremities: trace edema.  Skin: No rashes, lesions or ulcers Psychiatry: anxious about the NG tube.     Data Reviewed: I have personally reviewed following labs and imaging studies  CBC: Recent Labs  Lab 03/02/18 0645 03/03/18 0553 03/05/18 0521 03/06/18 0531 03/07/18 0507  WBC 18.8* 13.7* 18.2* 14.3* 14.1*  NEUTROABS  --   --  13.2 10.0*  --   HGB 11.1* 9.6* 13.0 11.5* 10.7*  HCT 36.1 31.2* 40.8 37.0 33.5*  MCV 87.4 88.6 85.4 84.5  82.9  PLT 348 285 319 320 937   Basic Metabolic Panel: Recent Labs  Lab 03/03/18 0553 03/04/18 0529 03/05/18 0521 03/06/18 0531 03/07/18 0507  NA 140 139 134* 137 136  K 3.5 3.4* 3.1* 3.5 3.4*  CL 103 103 101 103 101  CO2 26 25 22 26 26   GLUCOSE 86 87 94 88 92  BUN 10 8 <5* 6 <5*  CREATININE 0.68 0.68 0.48 0.56 0.45  CALCIUM 8.1* 7.7* 7.1* 7.6* 7.7*  MG 2.2  --   --   --   --    GFR: Estimated Creatinine Clearance: 42.6 mL/min (by C-G formula based on SCr of 0.45 mg/dL). Liver Function Tests: Recent Labs  Lab 03/03/18 0553 03/05/18 0521 03/06/18 0531  AST 10* 25 18  ALT 10* 11* 12*  ALKPHOS 96  106 91  BILITOT 0.5 0.5 0.5  PROT 4.9* 4.9* 4.4*  ALBUMIN 2.1* 2.1* 2.0*   No results for input(s): LIPASE, AMYLASE in the last 168 hours. No results for input(s): AMMONIA in the last 168 hours. Coagulation Profile: No results for input(s): INR, PROTIME in the last 168 hours. Cardiac Enzymes: No results for input(s): CKTOTAL, CKMB, CKMBINDEX, TROPONINI in the last 168 hours. BNP (last 3 results) No results for input(s): PROBNP in the last 8760 hours. HbA1C: No results for input(s): HGBA1C in the last 72 hours. CBG: Recent Labs  Lab 03/07/18 0016 03/07/18 0106 03/07/18 0421 03/07/18 0737 03/07/18 1150  GLUCAP 68 144* 79 95 91   Lipid Profile: No results for input(s): CHOL, HDL, LDLCALC, TRIG, CHOLHDL, LDLDIRECT in the last 72 hours. Thyroid Function Tests: No results for input(s): TSH, T4TOTAL, FREET4, T3FREE, THYROIDAB in the last 72 hours. Anemia Panel: No results for input(s): VITAMINB12, FOLATE, FERRITIN, TIBC, IRON, RETICCTPCT in the last 72 hours. Sepsis Labs: No results for input(s): PROCALCITON, LATICACIDVEN in the last 168 hours.  Recent Results (from the past 240 hour(s))  Gastrointestinal Panel by PCR , Stool     Status: None   Collection Time: 03/05/18  8:18 PM  Result Value Ref Range Status   Campylobacter species NOT DETECTED NOT DETECTED Final   Plesimonas shigelloides NOT DETECTED NOT DETECTED Final   Salmonella species NOT DETECTED NOT DETECTED Final   Yersinia enterocolitica NOT DETECTED NOT DETECTED Final   Vibrio species NOT DETECTED NOT DETECTED Final   Vibrio cholerae NOT DETECTED NOT DETECTED Final   Enteroaggregative E coli (EAEC) NOT DETECTED NOT DETECTED Final   Enteropathogenic E coli (EPEC) NOT DETECTED NOT DETECTED Final   Enterotoxigenic E coli (ETEC) NOT DETECTED NOT DETECTED Final   Shiga like toxin producing E coli (STEC) NOT DETECTED NOT DETECTED Final   Shigella/Enteroinvasive E coli (EIEC) NOT DETECTED NOT DETECTED Final    Cryptosporidium NOT DETECTED NOT DETECTED Final   Cyclospora cayetanensis NOT DETECTED NOT DETECTED Final   Entamoeba histolytica NOT DETECTED NOT DETECTED Final   Giardia lamblia NOT DETECTED NOT DETECTED Final   Adenovirus F40/41 NOT DETECTED NOT DETECTED Final   Astrovirus NOT DETECTED NOT DETECTED Final   Norovirus GI/GII NOT DETECTED NOT DETECTED Final   Rotavirus A NOT DETECTED NOT DETECTED Final   Sapovirus (I, II, IV, and V) NOT DETECTED NOT DETECTED Final    Comment: Performed at [facility], [address] [place], [place] [address]  C difficile quick scan w PCR reflex     Status: None   Collection Time: 03/06/18  7:42 AM  Result Value Ref Range Status  C Diff antigen NEGATIVE NEGATIVE Final   C Diff toxin NEGATIVE NEGATIVE Final   C Diff interpretation No C. difficile detected.  Final    Comment: Performed at [facility] [address] [place], [place] [address]         Radiology Studies: Nm Hepatobiliary Liver Func  Result Date: 03/05/2018 CLINICAL DATA:  Abdominal pain with nausea and vomiting. Distended gallbladder. EXAM: NUCLEAR MEDICINE HEPATOBILIARY IMAGING TECHNIQUE: Sequential images of the abdomen were obtained out to 60 minutes following intravenous administration of radiopharmaceutical. RADIOPHARMACEUTICALS:  5.19 mCi Tc-68m  Choletec IV COMPARISON:  CT abdomen and pelvis March 04, 2018 FINDINGS: Liver uptake of radiotracer is unremarkable. There is visualization of gallbladder and small bowel, indicating patency of the cystic and common bile ducts. The patient was unable to tolerate Ensure consumption to facilitate calculation of the computer generated ejection fraction of radiotracer from the gallbladder. IMPRESSION: Cystic and common bile ducts are patent, evidenced by visualization of gallbladder and small bowel. Gallbladder is noted to be distended on recent CT. Patient unable to tolerate Ensure to facilitate calculation of the  computer generated ejection fraction of radiotracer from gallbladder. Electronically Signed   By: Lowella Grip III M.D.   On: 03/05/2018 16:53   Dg Abd 2 Views  Result Date: 03/07/2018 CLINICAL DATA:  Nasogastric tube placement EXAM: ABDOMEN - 2 VIEW COMPARISON:  None. FINDINGS: Nasogastric tube with the tip projecting over the stomach. There is no bowel dilatation to suggest obstruction. There is no evidence of pneumoperitoneum, portal venous gas or pneumatosis. There are no pathologic calcifications along the expected course of the ureters. Trace bilateral pleural effusions. The osseous structures are unremarkable. IMPRESSION: Nasogastric tube with the tip projecting over the stomach. Electronically Signed   By: Kathreen Devoid   On: 03/07/2018 10:58        Scheduled Meds: . cholecalciferol  2,000 Units Oral Daily  . heparin  5,000 Units Subcutaneous Q8H  . iopamidol  30 mL Oral Once  . levothyroxine  12.5 mcg Intravenous Daily  . metoprolol tartrate  5 mg Intravenous Q8H  . mometasone-formoterol  2 puff Inhalation BID  . pantoprazole (PROTONIX) IV  40 mg Intravenous Q24H  . sertraline  50 mg Oral Daily  . tamsulosin  0.4 mg Oral QPC supper   Continuous Infusions: . ciprofloxacin Stopped (03/07/18 1332)  . dextrose Stopped (03/07/18 1349)  . metronidazole 500 mg (03/07/18 1349)     LOS: 8 days    Time spent: 35 minutes.     Hosie Poisson, MD [facility] Pager [phone]  If 7PM-7AM, please contact night-coverage [email or web address] Password [facility] 03/07/2018, 4:16 PM

## 2018-03-07 NOTE — Progress Notes (Addendum)
[place], [place], obtained cbg for 1600, reported it as 46. Docking the meter so far has not resulted in transfer of the data. Dillie Burandt, [employer]

## 2018-03-08 ENCOUNTER — Inpatient Hospital Stay (HOSPITAL_COMMUNITY): Payer: Medicare Other

## 2018-03-08 DIAGNOSIS — F419 Anxiety disorder, unspecified: Secondary | ICD-10-CM

## 2018-03-08 DIAGNOSIS — F329 Major depressive disorder, single episode, unspecified: Secondary | ICD-10-CM

## 2018-03-08 DIAGNOSIS — E876 Hypokalemia: Secondary | ICD-10-CM

## 2018-03-08 LAB — CBC
HCT: 33.1 % — ABNORMAL LOW (ref 36.0–46.0)
Hemoglobin: 10.6 g/dL — ABNORMAL LOW (ref 12.0–15.0)
MCH: 26.9 pg (ref 26.0–34.0)
MCHC: 32 g/dL (ref 30.0–36.0)
MCV: 84 fL (ref 78.0–100.0)
Platelets: 298 10*3/uL (ref 150–400)
RBC: 3.94 MIL/uL (ref 3.87–5.11)
RDW: 16.3 % — ABNORMAL HIGH (ref 11.5–15.5)
WBC: 17.1 10*3/uL — ABNORMAL HIGH (ref 4.0–10.5)

## 2018-03-08 LAB — GLUCOSE, CAPILLARY
Glucose-Capillary: 103 mg/dL — ABNORMAL HIGH (ref 65–99)
Glucose-Capillary: 115 mg/dL — ABNORMAL HIGH (ref 65–99)
Glucose-Capillary: 151 mg/dL — ABNORMAL HIGH (ref 65–99)
Glucose-Capillary: 85 mg/dL (ref 65–99)
Glucose-Capillary: 93 mg/dL (ref 65–99)
Glucose-Capillary: 93 mg/dL (ref 65–99)
Glucose-Capillary: 94 mg/dL (ref 65–99)

## 2018-03-08 LAB — BASIC METABOLIC PANEL
Anion gap: 10 (ref 5–15)
BUN: <5 mg/dL — ABNORMAL LOW (ref 6–20)
CO2: 30 mmol/L (ref 22–32)
Calcium: 7.7 mg/dL — ABNORMAL LOW (ref 8.9–10.3)
Chloride: 96 mmol/L — ABNORMAL LOW (ref 101–111)
Creatinine, Ser: 0.48 mg/dL (ref 0.44–1.00)
GFR calc Af Amer: >60 mL/min (ref >60)
GFR calc non Af Amer: >60 mL/min (ref >60)
Glucose, Bld: 93 mg/dL (ref 65–99)
Potassium: 2.6 mmol/L — CL (ref 3.5–5.1)
Sodium: 136 mmol/L (ref 135–145)

## 2018-03-08 LAB — MAGNESIUM: Magnesium: 1.4 mg/dL — ABNORMAL LOW (ref 1.7–2.4)

## 2018-03-08 LAB — OCCULT BLOOD X 1 CARD TO LAB, STOOL: Fecal Occult Bld: POSITIVE — AB

## 2018-03-08 LAB — POTASSIUM: Potassium: 3.3 mmol/L — ABNORMAL LOW (ref 3.5–5.1)

## 2018-03-08 MED ORDER — HYDROMORPHONE HCL 1 MG/ML IJ SOLN
0.5000 mg | Freq: Once | INTRAMUSCULAR | Status: AC
  Administered 2018-03-08: 0.5 mg via INTRAVENOUS
  Filled 2018-03-08: qty 0.5

## 2018-03-08 MED ORDER — POTASSIUM CHLORIDE CRYS ER 20 MEQ PO TBCR
40.0000 meq | EXTENDED_RELEASE_TABLET | Freq: Two times a day (BID) | ORAL | Status: AC
  Administered 2018-03-08 (×2): 40 meq via ORAL
  Filled 2018-03-08 (×2): qty 2

## 2018-03-08 MED ORDER — HYDROMORPHONE HCL 1 MG/ML IJ SOLN
1.0000 mg | INTRAMUSCULAR | Status: DC | PRN
  Administered 2018-03-08 – 2018-03-10 (×9): 1 mg via INTRAVENOUS
  Filled 2018-03-08 (×9): qty 1

## 2018-03-08 MED ORDER — POTASSIUM CHLORIDE 10 MEQ/100ML IV SOLN
10.0000 meq | INTRAVENOUS | Status: AC
  Administered 2018-03-08 (×4): 10 meq via INTRAVENOUS
  Filled 2018-03-08 (×4): qty 100

## 2018-03-08 MED ORDER — POTASSIUM CHLORIDE 10 MEQ/100ML IV SOLN
10.0000 meq | INTRAVENOUS | Status: AC
  Administered 2018-03-08 (×2): 10 meq via INTRAVENOUS
  Filled 2018-03-08 (×2): qty 100

## 2018-03-08 NOTE — Progress Notes (Signed)
Heather [name] 1:55 PM  Subjective: patient seen and examined and her hospital computer chart reviewed and her case discussed with my partner Dr. Michail Sermon and unfortunately her primary complaint is her NG tube but she did fail having it clamped and trying sips of clear liquids but I did hook up her tube to suction and not much was left in her stomach and she denies any diarrhea and no signs of bleeding but does not remember when her last colonoscopy was in the chart says 2014 and she says she tends to be constipated at home  Objective: Vital signs stable afebrile no acute distress abdomen is soft there is some minimal lower discomfort without guarding and rebound rare bowel sound potassium decreased 2  CTs reviewed Assessment: Multiple GI complaints and problems questionable etiology  Plan: Agree with trying to get NG tube out might need a flexible sigmoidoscopy at some point hopefully she will tolerate clear liquids soon Will check on tomorrow  [facility]  Pager [phone] After 5PM or if no answer call [phone]

## 2018-03-08 NOTE — Progress Notes (Signed)
Made Dr.Akula aware that patient had increasing pain after clamping NG tube and patient eating a small amount of clear liquids at lunch. Dr.Akula ordered 2 view ABD xray and increase IV dilaudid to 1mg  and given the remaining 0.5mg  now.

## 2018-03-08 NOTE — Progress Notes (Signed)
Physical Therapy Treatment Patient Details Name: Heather [name] MRN: [id number] DOB: [birth year] Today's Date: 03/08/2018    History of Present Illness 83 year old who was discharged on 02/22/2018 after treatment for acute hypoxic respiratory failure secondary to COPD exacerbation who readmitted on 02/27/2018 with abdominal pain nausea and vomiting and findings suggestive of colitis.  NG tube placed 02/04/18    PT Comments    Pt in bed with NG tube clamped.  Mild c/o nausea.  Eating multiple ICE chips.  Assisted OOB to amb a greater distance with walker.  Max c/o sore throat.  Assisted back to bed per pt request.   Follow Up Recommendations  Supervision for mobility/OOB;SNF     Equipment Recommendations  None recommended by PT    Recommendations for Other Services       Precautions / Restrictions Precautions Precautions: Fall Precaution Comments: NG tube Restrictions Weight Bearing Restrictions: No    Mobility  Bed Mobility Overal bed mobility: Needs Assistance Bed Mobility: Supine to Sit;Sit to Supine     Supine to sit: Supervision;HOB elevated;Min guard Sit to supine: Supervision;HOB elevated;Min guard   General bed mobility comments: increased time  Transfers Overall transfer level: Needs assistance Equipment used: Rolling walker (2 wheeled) Transfers: Sit to/from Stand Sit to Stand: Min guard;Min assist         General transfer comment: verbal cues for safe technique, assist to rise and steady  Ambulation/Gait Ambulation/Gait assistance: Min guard;Supervision Ambulation Distance (Feet): 55 Feet Assistive device: Rolling walker (2 wheeled) Gait Pattern/deviations: Step-through pattern;Decreased stride length;Trunk flexed Gait velocity: decreased    General Gait Details: tolerated an increased distance but still limited by ABD pain and mild c/o nausea.     Stairs            Wheelchair Mobility    Modified Rankin (Stroke Patients Only)       Balance                                            Cognition Arousal/Alertness: Awake/alert Behavior During Therapy: WFL for tasks assessed/performed Overall Cognitive Status: Within Functional Limits for tasks assessed                                        Exercises      General Comments        Pertinent Vitals/Pain Pain Assessment: Faces Faces Pain Scale: Hurts little more Pain Location: lower abdomen Pain Descriptors / Indicators: Aching;Grimacing Pain Intervention(s): Monitored during session    Home Living                      Prior Function            PT Goals (current goals can now be found in the care plan section) Progress towards PT goals: Progressing toward goals    Frequency    Min 3X/week      PT Plan Current plan remains appropriate    Co-evaluation              AM-PAC PT "6 Clicks" Daily Activity  Outcome Measure  Difficulty turning over in bed (including adjusting bedclothes, sheets and blankets)?: A Little Difficulty moving from lying on back to sitting on the side of the bed? : A Little Difficulty  sitting down on and standing up from a chair with arms (e.g., wheelchair, bedside commode, etc,.)?: A Little Help needed moving to and from a bed to chair (including a wheelchair)?: A Little Help needed walking in hospital room?: A Lot Help needed climbing 3-5 steps with a railing? : A Lot 6 Click Score: 16    End of Session Equipment Utilized During Treatment: Gait belt Activity Tolerance: Patient limited by fatigue;No increased pain Patient left: in bed;with call bell/phone within reach Nurse Communication: Mobility status PT Visit Diagnosis: Difficulty in walking, not elsewhere classified (R26.2);Muscle weakness (generalized) (M62.81);Unsteadiness on feet (R26.81)     Time: 1350-1415 PT Time Calculation (min) (ACUTE ONLY): 25 min  Charges:  $Gait Training: 8-22 mins $Therapeutic  Activity: 8-22 mins                    G Codes:       {Sedalia Greeson  PTA [facility]  Acute  Rehab Pager      [phone]

## 2018-03-08 NOTE — Progress Notes (Addendum)
   Subjective/Chief Complaint: She reports minimal abdominal discomfort. Complains more of the NG   Objective: Vital signs in last 24 hours: Temp:  [98.6 F (37 C)-98.9 F (37.2 C)] 98.9 F (37.2 C) (04/01 0503) Pulse Rate:  [67-91] 91 (04/01 0503) Resp:  [14-15] 15 (03/31 2100) BP: (148-163)/(55-62) 163/55 (04/01 0503) SpO2:  [90 %-97 %] 90 % (04/01 0748) Last BM Date: 03/07/18  Intake/Output from previous day: 03/31 0701 - 04/01 0700 In: 1550.8 [I.V.:850.8; IV Piggyback:700] Out: 2725 [Urine:2475; Emesis/NG output:250] Intake/Output this shift: Total I/O In: 100 [IV Piggyback:100] Out: 100 [Urine:100]  Exam: Looks comfortable Abdomen is mildly full, minimally tender  Lab Results:  Recent Labs    03/07/18 0507 03/08/18 0546  WBC 14.1* 17.1*  HGB 10.7* 10.6*  HCT 33.5* 33.1*  PLT 257 298   BMET Recent Labs    03/07/18 0507 03/08/18 0546  NA 136 136  K 3.4* 2.6*  CL 101 96*  CO2 26 30  GLUCOSE 92 93  BUN <5* <5*  CREATININE 0.45 0.48  CALCIUM 7.7* 7.7*   PT/INR No results for input(s): LABPROT, INR in the last 72 hours. ABG No results for input(s): PHART, HCO3 in the last 72 hours.  Invalid input(s): PCO2, PO2  Studies/Results: Dg Abd 2 Views  Result Date: 03/07/2018 CLINICAL DATA:  Nasogastric tube placement EXAM: ABDOMEN - 2 VIEW COMPARISON:  None. FINDINGS: Nasogastric tube with the tip projecting over the stomach. There is no bowel dilatation to suggest obstruction. There is no evidence of pneumoperitoneum, portal venous gas or pneumatosis. There are no pathologic calcifications along the expected course of the ureters. Trace bilateral pleural effusions. The osseous structures are unremarkable. IMPRESSION: Nasogastric tube with the tip projecting over the stomach. Electronically Signed   By: Kathreen Devoid   On: 03/07/2018 10:58    Anti-infectives: Anti-infectives (From admission, onward)   Start     Dose/Rate Route Frequency Ordered Stop   03/04/18 1400  metroNIDAZOLE (FLAGYL) IVPB 500 mg     500 mg 100 mL/hr over 60 Minutes Intravenous Every 8 hours 03/04/18 1211     03/04/18 1300  ciprofloxacin (CIPRO) IVPB 400 mg     400 mg 200 mL/hr over 60 Minutes Intravenous Every 12 hours 03/04/18 1211     02/28/18 0200  piperacillin-tazobactam (ZOSYN) IVPB 3.375 g  Status:  Discontinued     3.375 g 12.5 mL/hr over 240 Minutes Intravenous Every 8 hours 02/27/18 1753 03/04/18 1208   02/27/18 1745  piperacillin-tazobactam (ZOSYN) IVPB 3.375 g     3.375 g 100 mL/hr over 30 Minutes Intravenous STAT 02/27/18 1735 02/28/18 1745   02/27/18 1645  ciprofloxacin (CIPRO) IVPB 400 mg  Status:  Discontinued     400 mg 200 mL/hr over 60 Minutes Intravenous  Once 02/27/18 1643 02/27/18 1734   02/27/18 1645  metroNIDAZOLE (FLAGYL) IVPB 500 mg  Status:  Discontinued     500 mg 100 mL/hr over 60 Minutes Intravenous  Once 02/27/18 1643 02/27/18 1734      Assessment/Plan:  Colitis  HIDA neg for cholecystitis.  abd Xray yesterday normal without evidence of obstruction. WBC up further.  No evidence of diverticulitis on CT, just colitis. ?other source  Will d/c NG and start clears  LOS: 9 days    Heather [name] A 03/08/2018

## 2018-03-08 NOTE — Progress Notes (Signed)
CRITICAL VALUE ALERT  Critical Value:  K+ 2.6  Date & Time Notied:  0626  Provider Notified: [phone] [place]  Orders Received/Actions taken: orders received [id number]

## 2018-03-08 NOTE — Care Management Important Message (Signed)
Important Message  Patient Details  Name: Heather [name] MRN: [id number] Date of Birth: 10/10/1935   Medicare Important Message Given:  Yes    Kerin Salen 03/08/2018, 12:04 [place] Message  Patient Details  Name: Heather [name] MRN: [id number] Date of Birth: [birth year]   Medicare Important Message Given:  Yes    Kerin Salen 03/08/2018, 12:04 PM

## 2018-03-08 NOTE — Progress Notes (Signed)
Patient asked me to look at her perineal area due to painful to touch. Patient has redness, swelling, and pain in labia majora and minora. Gerhardts Butt cream would be helpful, however, patient is allergic to one of the ingredients, the steroid, which would help the swelling. Ice pack applied, pt states it helped a lot. Barrier cream used. Patient states the swelling started after a cleanser was sprayed on her this morning. Will remove from room and pass on to next shift. Hoyle Barr, RN-C

## 2018-03-08 NOTE — Progress Notes (Signed)
Dr.Akula aware of patients NG still in place. Clamped patients NG tube this am and provided water and jello for new clear liquid diet. Patient had two sips of water and one bite of jello, patient began dry heaving and increased nausea and increase in pain. Gave IV pain medication. Dr.Akula stated to give IV zofran when patients lunch arrived and to clamp NG tube to see if patient tolerated a clear liquid diet for lunch with nausea medicine.

## 2018-03-08 NOTE — Progress Notes (Signed)
PROGRESS NOTE    Heather [name]  NKN:[id number] DOB: [birth year] DOA: 02/27/2018 PCP: [name], MD    Brief Narrative:  83 year old who was discharged on 02/22/2018 after treatment for acute hypoxic respiratory failure secondary to COPD exacerbation who readmitted on 02/27/2018 with abdominal pain nausea and vomiting and findings suggestive of colitis.  Patient has a history of chronic constipation typically,. Patient also found to have a mild ileus or low-grade bowel obstruction from distal colitis.   Assessment & Plan:   Principal Problem:   Colitis Active Problems:   Hypothyroidism   Dyslipidemia   Anxiety and depression   Essential hypertension   COPD with emphysema (HCC)   Malnutrition of moderate degree   Hypokalemia   Abdominal pain   SBO (small bowel obstruction) (HCC)   Dehydration   Diffuse Colitis with SBO : CT abd shows some dilatation of the CBD with unclear etiology.  HIDA scan not sig for acute cholecystitis.  Surgery and GI  on board On NG tube and on suction. Afebrile, , persistent leukocytosis.  c diff pcr is negative and gi pathogen pcr is negative.  Currently on IV ciprofloxacin and IV flagyl. Continue the same.  IV fluids stopped because of third spacing and ascites, pleural effusions.  Repeat ABD film today shows some bowel dilatation but no obstruction. NG tube clamp today and try clears and if able to tolerate, can d/c N tube in am.    Pt reports worsening pain after clamping the NG tube, increased pain meds. Ordered PT evaluation.   AKI: Resolved.    Hypertension:  Well controlled.    Hypoalbuminemia:  Third spacing with ascites, pleural effusions.   Hypothyroidism: Resume synthroid.    Hypokalemia: replaced. Repeat in am.  Magnesium levels ordered.    DVT prophylaxis: HEPARIN Code Status: FULL CODE.  Family Communication: NONE AT BEDSIDE.  Disposition Plan: pending resolution of colitis.     Consultants:    Surgery  Gastroenterology.    Procedures: nONE.    Antimicrobials: ciprofloxacin and flagyl.    Subjective: Nauseated and abdominal pain present.  2 bm today.   Objective: Vitals:   03/07/18 2100 03/08/18 0503 03/08/18 0748 03/08/18 1343  BP: (!) 161/62 (!) 163/55  (!) 148/78  Pulse: 84 91  89  Resp: 15   16  Temp: 98.6 F (37 C) 98.9 F (37.2 C)  99 F (37.2 C)  TempSrc: Oral Oral  Oral  SpO2: 97% 95% 90% 92%  Weight:      Height:        Intake/Output Summary (Last 24 hours) at 03/08/2018 1930 Last data filed at 03/08/2018 1800 Gross per 24 hour  Intake 2180 ml  Output 2550 ml  Net -370 ml   Filed Weights   03/04/18 0500 03/05/18 0408 03/07/18 0500  Weight: 53.9 kg (118 lb 13.3 oz) 55.7 kg (122 lb 12.7 oz) 55.5 kg (122 lb 5.7 oz)    Examination:  General exam: in mod distress from abd pain.  Respiratory system: good air entry bilateral. No wheezing.  Cardiovascular system: S1 & S2 heard, RRR. No JVD, murmurs,. Trace pedal edema. Gastrointestinal system: abd is soft tender generalized, bowel sounds good, and distended .  Central nervous system: Alert and oriented. Non focal.  Extremities: trace edema.  Skin: No rashes, lesions or ulcers Psychiatry: anxious about the NG tube.     Data Reviewed: I have personally reviewed following labs and imaging studies  CBC: Recent Labs  Lab 03/03/18 0553  03/05/18 0521 03/06/18 0531 03/07/18 0507 03/08/18 0546  WBC 13.7* 18.2* 14.3* 14.1* 17.1*  NEUTROABS  --  13.2 10.0*  --   --   HGB 9.6* 13.0 11.5* 10.7* 10.6*  HCT 31.2* 40.8 37.0 33.5* 33.1*  MCV 88.6 85.4 84.5 82.9 84.0  PLT 285 319 320 257 539   Basic Metabolic Panel: Recent Labs  Lab 03/03/18 0553 03/04/18 0529 03/05/18 0521 03/06/18 0531 03/07/18 0507 03/08/18 0546 03/08/18 1334  NA 140 139 134* 137 136 136  --   K 3.5 3.4* 3.1* 3.5 3.4* 2.6* 3.3*  CL 103 103 101 103 101 96*  --   CO2 26 25 22 26 26 30   --   GLUCOSE 86 87 94 88 92 93  --    BUN 10 8 <5* 6 <5* <5*  --   CREATININE 0.68 0.68 0.48 0.56 0.45 0.48  --   CALCIUM 8.1* 7.7* 7.1* 7.6* 7.7* 7.7*  --   MG 2.2  --   --   --   --   --   --    GFR: Estimated Creatinine Clearance: 42.6 mL/min (by C-G formula based on SCr of 0.48 mg/dL). Liver Function Tests: Recent Labs  Lab 03/03/18 0553 03/05/18 0521 03/06/18 0531  AST 10* 25 18  ALT 10* 11* 12*  ALKPHOS 96 106 91  BILITOT 0.5 0.5 0.5  PROT 4.9* 4.9* 4.4*  ALBUMIN 2.1* 2.1* 2.0*   No results for input(s): LIPASE, AMYLASE in the last 168 hours. No results for input(s): AMMONIA in the last 168 hours. Coagulation Profile: No results for input(s): INR, PROTIME in the last 168 hours. Cardiac Enzymes: No results for input(s): CKTOTAL, CKMB, CKMBINDEX, TROPONINI in the last 168 hours. BNP (last 3 results) No results for input(s): PROBNP in the last 8760 hours. HbA1C: No results for input(s): HGBA1C in the last 72 hours. CBG: Recent Labs  Lab 03/08/18 0014 03/08/18 0501 03/08/18 0731 03/08/18 1147 03/08/18 1613  GLUCAP 85 93 103* 115* 151*   Lipid Profile: No results for input(s): CHOL, HDL, LDLCALC, TRIG, CHOLHDL, LDLDIRECT in the last 72 hours. Thyroid Function Tests: No results for input(s): TSH, T4TOTAL, FREET4, T3FREE, THYROIDAB in the last 72 hours. Anemia Panel: No results for input(s): VITAMINB12, FOLATE, FERRITIN, TIBC, IRON, RETICCTPCT in the last 72 hours. Sepsis Labs: No results for input(s): PROCALCITON, LATICACIDVEN in the last 168 hours.  Recent Results (from the past 240 hour(s))  Gastrointestinal Panel by PCR , Stool     Status: None   Collection Time: 03/05/18  8:18 PM  Result Value Ref Range Status   Campylobacter species NOT DETECTED NOT DETECTED Final   Plesimonas shigelloides NOT DETECTED NOT DETECTED Final   Salmonella species NOT DETECTED NOT DETECTED Final   Yersinia enterocolitica NOT DETECTED NOT DETECTED Final   Vibrio species NOT DETECTED NOT DETECTED Final   Vibrio  cholerae NOT DETECTED NOT DETECTED Final   Enteroaggregative E coli (EAEC) NOT DETECTED NOT DETECTED Final   Enteropathogenic E coli (EPEC) NOT DETECTED NOT DETECTED Final   Enterotoxigenic E coli (ETEC) NOT DETECTED NOT DETECTED Final   Shiga like toxin producing E coli (STEC) NOT DETECTED NOT DETECTED Final   Shigella/Enteroinvasive E coli (EIEC) NOT DETECTED NOT DETECTED Final   Cryptosporidium NOT DETECTED NOT DETECTED Final   Cyclospora cayetanensis NOT DETECTED NOT DETECTED Final   Entamoeba histolytica NOT DETECTED NOT DETECTED Final   Giardia lamblia NOT DETECTED NOT DETECTED Final   Adenovirus F40/41 NOT DETECTED  NOT DETECTED Final   Astrovirus NOT DETECTED NOT DETECTED Final   Norovirus GI/GII NOT DETECTED NOT DETECTED Final   Rotavirus A NOT DETECTED NOT DETECTED Final   Sapovirus (I, II, IV, and V) NOT DETECTED NOT DETECTED Final    Comment: Performed at [facility], [address] [place], [place] [address]  C difficile quick scan w PCR reflex     Status: None   Collection Time: 03/06/18  7:42 AM  Result Value Ref Range Status   C Diff antigen NEGATIVE NEGATIVE Final   C Diff toxin NEGATIVE NEGATIVE Final   C Diff interpretation No C. difficile detected.  Final    Comment: Performed at [facility], [place] [address] [place], [place] [address]         Radiology Studies: Dg Abd 2 Views  Result Date: 03/07/2018 CLINICAL DATA:  Nasogastric tube placement EXAM: ABDOMEN - 2 VIEW COMPARISON:  None. FINDINGS: Nasogastric tube with the tip projecting over the stomach. There is no bowel dilatation to suggest obstruction. There is no evidence of pneumoperitoneum, portal venous gas or pneumatosis. There are no pathologic calcifications along the expected course of the ureters. Trace bilateral pleural effusions. The osseous structures are unremarkable. IMPRESSION: Nasogastric tube with the tip projecting over the stomach. Electronically Signed   By: Kathreen Devoid   On: 03/07/2018 10:58   Dg Abd Portable 2v  Result Date: 03/08/2018 CLINICAL DATA:  Abdominal discomfort and distension, nausea, small-bowel obstruction EXAM: PORTABLE ABDOMEN - 2 VIEW COMPARISON:  03/07/2018 FINDINGS: Tip of nasogastric tube projects over gastric antrum. Single air-filled loop of bowel in the mid abdomen, suspect transverse colon. Paucity of remaining bowel gas. No definite dilated small bowel loops are visualized. Lung bases emphysematous but clear. Bones demineralized. IMPRESSION: Non-specific bowel gas pattern. Electronically Signed   By: Lavonia Dana M.D.   On: 03/08/2018 18:54        Scheduled Meds: . cholecalciferol  2,000 Units Oral Daily  . heparin  5,000 Units Subcutaneous Q8H  . hydrOXYzine  25 mg Intramuscular Once  . iopamidol  30 mL Oral Once  . levothyroxine  12.5 mcg Intravenous Daily  . metoprolol tartrate  5 mg Intravenous Q8H  . mometasone-formoterol  2 puff Inhalation BID  . pantoprazole (PROTONIX) IV  40 mg Intravenous Q24H  . potassium chloride  40 mEq Oral BID  . sertraline  50 mg Oral Daily  . tamsulosin  0.4 mg Oral QPC supper   Continuous Infusions: . ciprofloxacin Stopped (03/08/18 1540)  . dextrose 50 mL/hr at 03/08/18 0512  . metronidazole Stopped (03/08/18 1700)     LOS: 9 days    Time spent: 35 minutes.     Hosie Poisson, MD [facility] Pager [phone]  If 7PM-7AM, please contact night-coverage [email or web address] Password [email or web address] 03/08/2018, 7:30 PM

## 2018-03-09 LAB — BASIC METABOLIC PANEL
Anion gap: 9 (ref 5–15)
BUN: <5 mg/dL — ABNORMAL LOW (ref 6–20)
CO2: 29 mmol/L (ref 22–32)
Calcium: 7.6 mg/dL — ABNORMAL LOW (ref 8.9–10.3)
Chloride: 99 mmol/L — ABNORMAL LOW (ref 101–111)
Creatinine, Ser: 0.52 mg/dL (ref 0.44–1.00)
GFR calc Af Amer: >60 mL/min (ref >60)
GFR calc non Af Amer: >60 mL/min (ref >60)
Glucose, Bld: 113 mg/dL — ABNORMAL HIGH (ref 65–99)
Potassium: 3.2 mmol/L — ABNORMAL LOW (ref 3.5–5.1)
Sodium: 137 mmol/L (ref 135–145)

## 2018-03-09 LAB — VITAMIN B12: Vitamin B-12: 451 pg/mL (ref 180–914)

## 2018-03-09 LAB — GLUCOSE, CAPILLARY
Glucose-Capillary: 103 mg/dL — ABNORMAL HIGH (ref 65–99)
Glucose-Capillary: 101 mg/dL — ABNORMAL HIGH (ref 65–99)
Glucose-Capillary: 122 mg/dL — ABNORMAL HIGH (ref 65–99)
Glucose-Capillary: 92 mg/dL (ref 65–99)
Glucose-Capillary: 98 mg/dL (ref 65–99)

## 2018-03-09 LAB — CBC WITH DIFFERENTIAL/PLATELET
Basophils Absolute: 0 10*3/uL (ref 0.0–0.1)
Basophils Relative: 0 %
Eosinophils Absolute: 0 10*3/uL (ref 0.0–0.7)
Eosinophils Relative: 0 %
HCT: 30.5 % — ABNORMAL LOW (ref 36.0–46.0)
Hemoglobin: 9.7 g/dL — ABNORMAL LOW (ref 12.0–15.0)
Lymphocytes Relative: 10 %
Lymphs Abs: 1.4 10*3/uL (ref 0.7–4.0)
MCH: 27 pg (ref 26.0–34.0)
MCHC: 31.8 g/dL (ref 30.0–36.0)
MCV: 85 fL (ref 78.0–100.0)
Monocytes Absolute: 1.1 10*3/uL — ABNORMAL HIGH (ref 0.1–1.0)
Monocytes Relative: 8 %
Neutro Abs: 11.5 10*3/uL — ABNORMAL HIGH (ref 1.7–7.7)
Neutrophils Relative %: 82 %
Platelets: 329 10*3/uL (ref 150–400)
RBC: 3.59 MIL/uL — ABNORMAL LOW (ref 3.87–5.11)
RDW: 16.8 % — ABNORMAL HIGH (ref 11.5–15.5)
WBC: 14 10*3/uL — ABNORMAL HIGH (ref 4.0–10.5)

## 2018-03-09 LAB — FERRITIN: Ferritin: 103 ng/mL (ref 11–307)

## 2018-03-09 LAB — FOLATE: Folate: 3.4 ng/mL — ABNORMAL LOW (ref >5.9)

## 2018-03-09 LAB — IRON AND TIBC
Iron: 45 ug/dL (ref 28–170)
Saturation Ratios: 24 % (ref 10.4–31.8)
TIBC: 189 ug/dL — ABNORMAL LOW (ref 250–450)
UIBC: 144 ug/dL

## 2018-03-09 LAB — MAGNESIUM: Magnesium: 1.4 mg/dL — ABNORMAL LOW (ref 1.7–2.4)

## 2018-03-09 LAB — RETICULOCYTES
RBC.: 3.62 MIL/uL — ABNORMAL LOW (ref 3.87–5.11)
Retic Count, Absolute: 47.1 10*3/uL (ref 19.0–186.0)
Retic Ct Pct: 1.3 % (ref 0.4–3.1)

## 2018-03-09 MED ORDER — LEVOTHYROXINE SODIUM 25 MCG PO TABS
25.0000 ug | ORAL_TABLET | Freq: Every day | ORAL | Status: DC
  Administered 2018-03-10 – 2018-03-13 (×4): 25 ug via ORAL
  Filled 2018-03-09 (×4): qty 1

## 2018-03-09 MED ORDER — MAGNESIUM SULFATE 50 % IJ SOLN
3.0000 g | Freq: Once | INTRAVENOUS | Status: AC
  Administered 2018-03-09: 17:00:00 3 g via INTRAVENOUS
  Filled 2018-03-09: qty 6

## 2018-03-09 MED ORDER — POTASSIUM CHLORIDE CRYS ER 20 MEQ PO TBCR
40.0000 meq | EXTENDED_RELEASE_TABLET | Freq: Two times a day (BID) | ORAL | Status: AC
  Administered 2018-03-09 (×2): 40 meq via ORAL
  Filled 2018-03-09 (×2): qty 2

## 2018-03-09 MED ORDER — POTASSIUM CHLORIDE 10 MEQ/100ML IV SOLN
10.0000 meq | INTRAVENOUS | Status: AC
  Administered 2018-03-09 (×2): 10 meq via INTRAVENOUS
  Filled 2018-03-09 (×2): qty 100

## 2018-03-09 MED ORDER — PROMETHAZINE HCL 25 MG/ML IJ SOLN
12.5000 mg | Freq: Four times a day (QID) | INTRAMUSCULAR | Status: DC | PRN
  Administered 2018-03-09 – 2018-03-11 (×3): 12.5 mg via INTRAVENOUS
  Filled 2018-03-09 (×3): qty 1

## 2018-03-09 NOTE — Progress Notes (Signed)
PROGRESS NOTE    Heather [name]  MPN:[id number] DOB: [birth year] DOA: 02/27/2018 PCP: [name], MD    Brief Narrative:  83 year old who was discharged on 02/22/2018 after treatment for acute hypoxic respiratory failure secondary to COPD exacerbation who readmitted on 02/27/2018 with abdominal pain nausea and vomiting and findings suggestive of colitis.  Patient has a history of chronic constipation typically,. Patient also found to have a mild ileus or low-grade bowel obstruction from distal colitis.   Assessment & Plan:   Principal Problem:   Colitis Active Problems:   Hypothyroidism   Dyslipidemia   Anxiety and depression   Essential hypertension   COPD with emphysema (HCC)   Malnutrition of moderate degree   Hypokalemia   Abdominal pain   SBO (small bowel obstruction) (HCC)   Dehydration   Diffuse Colitis with SBO : CT abd shows some dilatation of the CBD with unclear etiology.  HIDA scan not sig for acute cholecystitis.  Surgery and GI  on board On NG tube and on suction. Afebrile, , persistent leukocytosis.  c diff pcr is negative and gi pathogen pcr is negative.  Currently on IV ciprofloxacin and IV flagyl. Continue the same.  IV fluids stopped because of third spacing and ascites, pleural effusions.  Repeat ABD film today shows some bowel dilatation but no obstruction.  Repeat abdominal films does not show any obstruction.  NG tube discontinued.  She was started on clears will advance diet as tolerated.  Patient remains afebrile and leukocytosis is improving.   AKI: Resolved.    Hypertension:  Well controlled.    Hypoalbuminemia:  Third spacing with ascites, pleural effusions.   Hypothyroidism: Resume synthroid.  Change to oral starting from tomorrow   Hypokalemia: replaced. Repeat in am.    Hypomagnesemia replaced a repeat in the morning.  Anemia of chronic disease Normocytic.  Baseline creatinine around 11 currently her hemoglobin is  around 9.7.  Need further workup with sigmoidoscopy by gastroenterology.  Fecal occult blood was positive.  Will get anemia panel.   DVT prophylaxis: HEPARIN Code Status: FULL CODE.  Family Communication: NONE AT BEDSIDE.  Disposition Plan: pending resolution of colitis.     Consultants:   Surgery  Gastroenterology.    Procedures: nONE.    Antimicrobials: ciprofloxacin and flagyl.  Since admission   Subjective: Patient feels better after the NG tube is out but she continues to get nauseated.  Abdominal pain is better.  Objective: Vitals:   03/08/18 2130 03/09/18 0555 03/09/18 0728 03/09/18 1314  BP: (!) 158/69 (!) 146/63  (!) 145/60  Pulse: 80 80  80  Resp: 15   16  Temp: 99.3 F (37.4 C) 99 F (37.2 C)  98.4 F (36.9 C)  TempSrc: Oral Oral  Oral  SpO2: 93% 92% 95% 96%  Weight:      Height:        Intake/Output Summary (Last 24 hours) at 03/09/2018 1510 Last data filed at 03/09/2018 1410 Gross per 24 hour  Intake 2840 ml  Output 2125 ml  Net 715 ml   Filed Weights   03/04/18 0500 03/05/18 0408 03/07/18 0500  Weight: 53.9 kg (118 lb 13.3 oz) 55.7 kg (122 lb 12.7 oz) 55.5 kg (122 lb 5.7 oz)    Examination:  General exam: Appears comfortable sitting in the chair, NG tube is out Respiratory system: good air entry bilateral. No wheezing.  Or rhonchi Cardiovascular system: S1 & S2 heard, RRR. No JVD, murmurs,. Trace pedal edema. Gastrointestinal system:  abd is soft, mild tenderness in the abdomen, mildly distended abdomen, bowel sounds are good..  Central nervous system: Alert and oriented. Non focal.  Extremities: trace edema.  Skin: No rashes, lesions or ulcers Psychiatry: Normal mood.    Data Reviewed: I have personally reviewed following labs and imaging studies  CBC: Recent Labs  Lab 03/05/18 0521 03/06/18 0531 03/07/18 0507 03/08/18 0546 03/09/18 0642  WBC 18.2* 14.3* 14.1* 17.1* 14.0*  NEUTROABS 13.2 10.0*  --   --  11.5*  HGB 13.0 11.5*  10.7* 10.6* 9.7*  HCT 40.8 37.0 33.5* 33.1* 30.5*  MCV 85.4 84.5 82.9 84.0 85.0  PLT 319 320 257 298 229   Basic Metabolic Panel: Recent Labs  Lab 03/03/18 0553  03/05/18 0521 03/06/18 0531 03/07/18 0507 03/08/18 0546 03/08/18 1334 03/08/18 1338 03/09/18 0552  NA 140   < > 134* 137 136 136  --   --  137  K 3.5   < > 3.1* 3.5 3.4* 2.6* 3.3*  --  3.2*  CL 103   < > 101 103 101 96*  --   --  99*  CO2 26   < > 22 26 26 30   --   --  29  GLUCOSE 86   < > 94 88 92 93  --   --  113*  BUN 10   < > <5* 6 <5* <5*  --   --  <5*  CREATININE 0.68   < > 0.48 0.56 0.45 0.48  --   --  0.52  CALCIUM 8.1*   < > 7.1* 7.6* 7.7* 7.7*  --   --  7.6*  MG 2.2  --   --   --   --   --   --  1.4*  --    < > = values in this interval not displayed.   GFR: Estimated Creatinine Clearance: 42.6 mL/min (by C-G formula based on SCr of 0.52 mg/dL). Liver Function Tests: Recent Labs  Lab 03/03/18 0553 03/05/18 0521 03/06/18 0531  AST 10* 25 18  ALT 10* 11* 12*  ALKPHOS 96 106 91  BILITOT 0.5 0.5 0.5  PROT 4.9* 4.9* 4.4*  ALBUMIN 2.1* 2.1* 2.0*   No results for input(s): LIPASE, AMYLASE in the last 168 hours. No results for input(s): AMMONIA in the last 168 hours. Coagulation Profile: No results for input(s): INR, PROTIME in the last 168 hours. Cardiac Enzymes: No results for input(s): CKTOTAL, CKMB, CKMBINDEX, TROPONINI in the last 168 hours. BNP (last 3 results) No results for input(s): PROBNP in the last 8760 hours. HbA1C: No results for input(s): HGBA1C in the last 72 hours. CBG: Recent Labs  Lab 03/08/18 1613 03/08/18 2103 03/09/18 0552 03/09/18 0812 03/09/18 1208  GLUCAP 151* 94 103* 101* 122*   Lipid Profile: No results for input(s): CHOL, HDL, LDLCALC, TRIG, CHOLHDL, LDLDIRECT in the last 72 hours. Thyroid Function Tests: No results for input(s): TSH, T4TOTAL, FREET4, T3FREE, THYROIDAB in the last 72 hours. Anemia Panel: No results for input(s): VITAMINB12, FOLATE, FERRITIN,  TIBC, IRON, RETICCTPCT in the last 72 hours. Sepsis Labs: No results for input(s): PROCALCITON, LATICACIDVEN in the last 168 hours.  Recent Results (from the past 240 hour(s))  Gastrointestinal Panel by PCR , Stool     Status: None   Collection Time: 03/05/18  8:18 PM  Result Value Ref Range Status   Campylobacter species NOT DETECTED NOT DETECTED Final   Plesimonas shigelloides NOT DETECTED NOT DETECTED Final   Salmonella  species NOT DETECTED NOT DETECTED Final   Yersinia enterocolitica NOT DETECTED NOT DETECTED Final   Vibrio species NOT DETECTED NOT DETECTED Final   Vibrio cholerae NOT DETECTED NOT DETECTED Final   Enteroaggregative E coli (EAEC) NOT DETECTED NOT DETECTED Final   Enteropathogenic E coli (EPEC) NOT DETECTED NOT DETECTED Final   Enterotoxigenic E coli (ETEC) NOT DETECTED NOT DETECTED Final   Shiga like toxin producing E coli (STEC) NOT DETECTED NOT DETECTED Final   Shigella/Enteroinvasive E coli (EIEC) NOT DETECTED NOT DETECTED Final   Cryptosporidium NOT DETECTED NOT DETECTED Final   Cyclospora cayetanensis NOT DETECTED NOT DETECTED Final   Entamoeba histolytica NOT DETECTED NOT DETECTED Final   Giardia lamblia NOT DETECTED NOT DETECTED Final   Adenovirus F40/41 NOT DETECTED NOT DETECTED Final   Astrovirus NOT DETECTED NOT DETECTED Final   Norovirus GI/GII NOT DETECTED NOT DETECTED Final   Rotavirus A NOT DETECTED NOT DETECTED Final   Sapovirus (I, II, IV, and V) NOT DETECTED NOT DETECTED Final    Comment: Performed at [facility], [address] [place], [place] [address]  C difficile quick scan w PCR reflex     Status: None   Collection Time: 03/06/18  7:42 AM  Result Value Ref Range Status   C Diff antigen NEGATIVE NEGATIVE Final   C Diff toxin NEGATIVE NEGATIVE Final   C Diff interpretation No C. difficile detected.  Final    Comment: Performed at [facility], [place] [address] [place], [place] [address]         Radiology  Studies: Dg Abd Portable 2v  Result Date: 03/08/2018 CLINICAL DATA:  Abdominal discomfort and distension, nausea, small-bowel obstruction EXAM: PORTABLE ABDOMEN - 2 VIEW COMPARISON:  03/07/2018 FINDINGS: Tip of nasogastric tube projects over gastric antrum. Single air-filled loop of bowel in the mid abdomen, suspect transverse colon. Paucity of remaining bowel gas. No definite dilated small bowel loops are visualized. Lung bases emphysematous but clear. Bones demineralized. IMPRESSION: Non-specific bowel gas pattern. Electronically Signed   By: Lavonia Dana M.D.   On: 03/08/2018 18:54        Scheduled Meds: . cholecalciferol  2,000 Units Oral Daily  . heparin  5,000 Units Subcutaneous Q8H  . hydrOXYzine  25 mg Intramuscular Once  . iopamidol  30 mL Oral Once  . levothyroxine  12.5 mcg Intravenous Daily  . metoprolol tartrate  5 mg Intravenous Q8H  . mometasone-formoterol  2 puff Inhalation BID  . pantoprazole (PROTONIX) IV  40 mg Intravenous Q24H  . sertraline  50 mg Oral Daily  . tamsulosin  0.4 mg Oral QPC supper   Continuous Infusions: . ciprofloxacin Stopped (03/09/18 1311)  . dextrose 50 mL/hr at 03/09/18 0807  . metronidazole Stopped (03/09/18 0700)     LOS: 10 days    Time spent: 35 minutes.     Hosie Poisson, MD [facility] Pager [phone]  If 7PM-7AM, please contact night-coverage [email or web address] Password [email or web address] 03/09/2018, 3:10 PM

## 2018-03-09 NOTE — Progress Notes (Signed)
Patient ID: Heather [name], female   DOB: [birth year], 83 y.o.   MRN: [id number]       Subjective: Patient with no nausea.  NGT still in place, clamped, even though order was written for NG to be removed yesterday.  Still having some abdominal pain.  No nausea  Objective: Vital signs in last 24 hours: Temp:  [99 F (37.2 C)-99.3 F (37.4 C)] 99 F (37.2 C) (04/02 0555) Pulse Rate:  [80-89] 80 (04/02 0555) Resp:  [15-16] 15 (04/01 2130) BP: (146-158)/(63-78) 146/63 (04/02 0555) SpO2:  [92 %-95 %] 95 % (04/02 0728) FiO2 (%):  [21 %] 21 % (04/02 0728) Last BM Date: 03/08/18  Intake/Output from previous day: 04/01 0701 - 04/02 0700 In: 2700 [P.O.:360; I.V.:1140; IV Piggyback:1200] Out: 2400 [Urine:2400] Intake/Output this shift: Total I/O In: 60 [P.O.:60] Out: 150 [Urine:150]  PE: Abd: soft, diffusely tender, but no guarding, +BS, nondistended.  Lab Results:  Recent Labs    03/08/18 0546 03/09/18 0642  WBC 17.1* 14.0*  HGB 10.6* 9.7*  HCT 33.1* 30.5*  PLT 298 329   BMET Recent Labs    03/08/18 0546 03/08/18 1334 03/09/18 0552  NA 136  --  137  K 2.6* 3.3* 3.2*  CL 96*  --  99*  CO2 30  --  29  GLUCOSE 93  --  113*  BUN <5*  --  <5*  CREATININE 0.48  --  0.52  CALCIUM 7.7*  --  7.6*   PT/INR No results for input(s): LABPROT, INR in the last 72 hours. CMP     Component Value Date/Time   NA 137 03/09/2018 0552   K 3.2 (L) 03/09/2018 0552   CL 99 (L) 03/09/2018 0552   CO2 29 03/09/2018 0552   GLUCOSE 113 (H) 03/09/2018 0552   BUN <5 (L) 03/09/2018 0552   CREATININE 0.52 03/09/2018 0552   CALCIUM 7.6 (L) 03/09/2018 0552   PROT 4.4 (L) 03/06/2018 0531   ALBUMIN 2.0 (L) 03/06/2018 0531   AST 18 03/06/2018 0531   ALT 12 (L) 03/06/2018 0531   ALKPHOS 91 03/06/2018 0531   BILITOT 0.5 03/06/2018 0531   GFRNONAA >60 03/09/2018 0552   GFRAA >60 03/09/2018 0552   Lipase     Component Value Date/Time   LIPASE 25 02/27/2018 1108        Studies/Results: Dg Abd Portable 2v  Result Date: 03/08/2018 CLINICAL DATA:  Abdominal discomfort and distension, nausea, small-bowel obstruction EXAM: PORTABLE ABDOMEN - 2 VIEW COMPARISON:  03/07/2018 FINDINGS: Tip of nasogastric tube projects over gastric antrum. Single air-filled loop of bowel in the mid abdomen, suspect transverse colon. Paucity of remaining bowel gas. No definite dilated small bowel loops are visualized. Lung bases emphysematous but clear. Bones demineralized. IMPRESSION: Non-specific bowel gas pattern. Electronically Signed   By: Lavonia Dana M.D.   On: 03/08/2018 18:54    Anti-infectives: Anti-infectives (From admission, onward)   Start     Dose/Rate Route Frequency Ordered Stop   03/04/18 1400  metroNIDAZOLE (FLAGYL) IVPB 500 mg     500 mg 100 mL/hr over 60 Minutes Intravenous Every 8 hours 03/04/18 1211     03/04/18 1300  ciprofloxacin (CIPRO) IVPB 400 mg     400 mg 200 mL/hr over 60 Minutes Intravenous Every 12 hours 03/04/18 1211     02/28/18 0200  piperacillin-tazobactam (ZOSYN) IVPB 3.375 g  Status:  Discontinued     3.375 g 12.5 mL/hr over 240 Minutes Intravenous Every 8 hours 02/27/18 1753  03/04/18 1208   02/27/18 1745  piperacillin-tazobactam (ZOSYN) IVPB 3.375 g     3.375 g 100 mL/hr over 30 Minutes Intravenous STAT 02/27/18 1735 02/28/18 1745   02/27/18 1645  ciprofloxacin (CIPRO) IVPB 400 mg  Status:  Discontinued     400 mg 200 mL/hr over 60 Minutes Intravenous  Once 02/27/18 1643 02/27/18 1734   02/27/18 1645  metroNIDAZOLE (FLAGYL) IVPB 500 mg  Status:  Discontinued     500 mg 100 mL/hr over 60 Minutes Intravenous  Once 02/27/18 1643 02/27/18 1734       Assessment/Plan  Colitis Agree with GI for flex sig at some point. NGT to be DC.  Continue clears and may advance diet as tolerates or pain allows. No plans for surgical intervention.  Will defer further colitis care to primary service and GI.  We will sign off.  Call back if  needed.  FEN - clears VTE - heparin ID - Cipro   LOS: 10 days    Henreitta Cea , [facility] 03/09/2018, 12:09 PM Pager: [phone]

## 2018-03-09 NOTE — Progress Notes (Signed)
Physical Therapy Treatment Patient Details Name: Heather [name] MRN: [id number] DOB: [birth year] Today's Date: 03/09/2018    History of Present Illness 83 year old who was discharged on 02/22/2018 after treatment for acute hypoxic respiratory failure secondary to COPD exacerbation who readmitted on 02/27/2018 with abdominal pain nausea and vomiting and findings suggestive of colitis.  NG tube placed 02/04/18    PT Comments      Assisted OOB to amb a greater distance with walker.  Pt required increased time and rest breaks between activityies.  "I feel so weak and tired"   Pt reports several days of poor po intake.    Follow Up Recommendations  Supervision for mobility/OOB;SNF     Equipment Recommendations  None recommended by PT    Recommendations for Other Services       Precautions / Restrictions Precautions Precautions: Fall Restrictions Weight Bearing Restrictions: No    Mobility  Bed Mobility Overal bed mobility: Needs Assistance Bed Mobility: Supine to Sit;Sit to Supine     Supine to sit: Supervision;HOB elevated;Min guard Sit to supine: Supervision;HOB elevated;Min guard   General bed mobility comments: increased time and assist lines and tubes  Transfers Overall transfer level: Needs assistance Equipment used: Rolling walker (2 wheeled) Transfers: Sit to/from Stand Sit to Stand: Min guard;Min assist         General transfer comment: verbal cues for safe technique, assist to rise and steady  Ambulation/Gait Ambulation/Gait assistance: Min guard;Supervision Ambulation Distance (Feet): 75 Feet Assistive device: Rolling walker (2 wheeled) Gait Pattern/deviations: Step-through pattern;Decreased stride length;Trunk flexed Gait velocity: decreased    General Gait Details: tolerated an increased distance but still limited by ABD pain.  No c/o nausea today    Stairs            Wheelchair Mobility    Modified Rankin (Stroke Patients Only)        Balance                                            Cognition Arousal/Alertness: Awake/alert Behavior During Therapy: WFL for tasks assessed/performed Overall Cognitive Status: Within Functional Limits for tasks assessed                                        Exercises      General Comments        Pertinent Vitals/Pain Faces Pain Scale: Hurts little more Pain Location: lower abdomen Pain Descriptors / Indicators: Aching;Grimacing Pain Intervention(s): Monitored during session    Home Living                      Prior Function            PT Goals (current goals can now be found in the care plan section) Progress towards PT goals: Progressing toward goals    Frequency    Min 3X/week      PT Plan Current plan remains appropriate    Co-evaluation              AM-PAC PT "6 Clicks" Daily Activity  Outcome Measure  Difficulty turning over in bed (including adjusting bedclothes, sheets and blankets)?: A Little Difficulty moving from lying on back to sitting on the side of the bed? : A Little Difficulty sitting  down on and standing up from a chair with arms (e.g., wheelchair, bedside commode, etc,.)?: A Little Help needed moving to and from a bed to chair (including a wheelchair)?: A Little Help needed walking in hospital room?: A Lot Help needed climbing 3-5 steps with a railing? : A Lot 6 Click Score: 16    End of Session Equipment Utilized During Treatment: Gait belt Activity Tolerance: No increased pain;Patient tolerated treatment well Patient left: in bed;with call bell/phone within reach Nurse Communication: Mobility status PT Visit Diagnosis: Difficulty in walking, not elsewhere classified (R26.2);Muscle weakness (generalized) (M62.81);Unsteadiness on feet (R26.81)     Time: 9507-2257 PT Time Calculation (min) (ACUTE ONLY): 27 min  Charges:  $Gait Training: 8-22 mins $Therapeutic Activity: 8-22  mins                    G Codes:       Rica Koyanagi  PTA [facility]  Acute  Rehab Pager      [phone]

## 2018-03-09 NOTE — Progress Notes (Addendum)
Heather [name] Diana 12:20 PM  Subjective: Patient doing better than yesterday no new complaints and no bowel movements and no nausea or vomiting today and seemingly tolerating clear liquids she is glad to have the NG tube out  Objective: Vital signs stable afebrile no acute distress abdomen mildly tender mostly left lower quadrant rare bowel sounds no guarding or rebound white count decreased k still low  Assessment: improved  Plan: Will check on tomorrow and if she continues to improve will discuss flexible sigmoidoscopy for Thursday  Heather [name] E  Pager [phone] After 5PM or if no answer call [phone]

## 2018-03-10 ENCOUNTER — Ambulatory Visit: Payer: Self-pay | Admitting: *Deleted

## 2018-03-10 LAB — GLUCOSE, CAPILLARY
Glucose-Capillary: 107 mg/dL — ABNORMAL HIGH (ref 65–99)
Glucose-Capillary: 113 mg/dL — ABNORMAL HIGH (ref 65–99)
Glucose-Capillary: 95 mg/dL (ref 65–99)

## 2018-03-10 LAB — BASIC METABOLIC PANEL
Anion gap: 8 (ref 5–15)
BUN: <5 mg/dL — ABNORMAL LOW (ref 6–20)
CO2: 29 mmol/L (ref 22–32)
Calcium: 7.5 mg/dL — ABNORMAL LOW (ref 8.9–10.3)
Chloride: 100 mmol/L — ABNORMAL LOW (ref 101–111)
Creatinine, Ser: 0.53 mg/dL (ref 0.44–1.00)
GFR calc Af Amer: >60 mL/min (ref >60)
GFR calc non Af Amer: >60 mL/min (ref >60)
Glucose, Bld: 112 mg/dL — ABNORMAL HIGH (ref 65–99)
Potassium: 3.1 mmol/L — ABNORMAL LOW (ref 3.5–5.1)
Sodium: 137 mmol/L (ref 135–145)

## 2018-03-10 LAB — MAGNESIUM: Magnesium: 2.1 mg/dL (ref 1.7–2.4)

## 2018-03-10 LAB — CBC
HCT: 30.9 % — ABNORMAL LOW (ref 36.0–46.0)
Hemoglobin: 9.7 g/dL — ABNORMAL LOW (ref 12.0–15.0)
MCH: 26.6 pg (ref 26.0–34.0)
MCHC: 31.4 g/dL (ref 30.0–36.0)
MCV: 84.7 fL (ref 78.0–100.0)
Platelets: 334 10*3/uL (ref 150–400)
RBC: 3.65 MIL/uL — ABNORMAL LOW (ref 3.87–5.11)
RDW: 17.3 % — ABNORMAL HIGH (ref 11.5–15.5)
WBC: 12.2 10*3/uL — ABNORMAL HIGH (ref 4.0–10.5)

## 2018-03-10 MED ORDER — POTASSIUM CHLORIDE CRYS ER 20 MEQ PO TBCR: 40.0000 meq | EXTENDED_RELEASE_TABLET | Freq: Two times a day (BID) | ORAL | Status: DC

## 2018-03-10 MED ORDER — POTASSIUM CHLORIDE 20 MEQ PO PACK
40.0000 meq | PACK | Freq: Two times a day (BID) | ORAL | Status: DC
Start: 2018-03-10 — End: 2018-03-13
  Administered 2018-03-10 – 2018-03-13 (×7): 40 meq via ORAL
  Filled 2018-03-10 (×8): qty 2

## 2018-03-10 MED ORDER — CLORAZEPATE DIPOTASSIUM 7.5 MG PO TABS
7.5000 mg | ORAL_TABLET | Freq: Two times a day (BID) | ORAL | Status: DC | PRN
  Administered 2018-03-12: 7.5 mg via ORAL
  Filled 2018-03-10: qty 1

## 2018-03-10 MED ORDER — DILTIAZEM HCL ER COATED BEADS 240 MG PO CP24
240.0000 mg | ORAL_CAPSULE | Freq: Every day | ORAL | Status: DC
  Administered 2018-03-10 – 2018-03-13 (×4): 240 mg via ORAL
  Filled 2018-03-10 (×4): qty 1

## 2018-03-10 MED ORDER — POTASSIUM CHLORIDE 10 MEQ/100ML IV SOLN
INTRAVENOUS | Status: AC
  Administered 2018-03-10: 20:00:00 10 meq
  Filled 2018-03-10: qty 100

## 2018-03-10 MED ORDER — TIOTROPIUM BROMIDE MONOHYDRATE 18 MCG IN CAPS
1.0000 | ORAL_CAPSULE | Freq: Every day | RESPIRATORY_TRACT | Status: DC
  Administered 2018-03-11 – 2018-03-13 (×3): 18 ug via RESPIRATORY_TRACT
  Filled 2018-03-10: qty 5

## 2018-03-10 MED ORDER — POTASSIUM CHLORIDE CRYS ER 20 MEQ PO TBCR
40.0000 meq | EXTENDED_RELEASE_TABLET | Freq: Two times a day (BID) | ORAL | Status: DC
  Filled 2018-03-10: qty 2

## 2018-03-10 MED ORDER — POTASSIUM CHLORIDE 10 MEQ/100ML IV SOLN
10.0000 meq | INTRAVENOUS | Status: AC
  Administered 2018-03-10 (×2): 10 meq via INTRAVENOUS
  Filled 2018-03-10 (×2): qty 100

## 2018-03-10 MED ORDER — POTASSIUM CHLORIDE 10 MEQ/100ML IV SOLN
10.0000 meq | INTRAVENOUS | Status: AC
  Administered 2018-03-10: 18:00:00 10 meq via INTRAVENOUS
  Filled 2018-03-10: qty 100

## 2018-03-10 MED ORDER — FOLIC ACID 1 MG PO TABS
1.0000 mg | ORAL_TABLET | Freq: Every day | ORAL | Status: DC
Start: 2018-03-10 — End: 2018-03-13
  Administered 2018-03-10 – 2018-03-13 (×4): 1 mg via ORAL
  Filled 2018-03-10 (×4): qty 1

## 2018-03-10 MED ORDER — POTASSIUM CHLORIDE 20 MEQ PO PACK: 40.0000 meq | PACK | Freq: Two times a day (BID) | ORAL | Status: DC

## 2018-03-10 MED ORDER — TRAMADOL HCL 50 MG PO TABS
100.0000 mg | ORAL_TABLET | Freq: Four times a day (QID) | ORAL | Status: DC | PRN
Start: 2018-03-10 — End: 2018-03-13
  Administered 2018-03-10 – 2018-03-13 (×12): 100 mg via ORAL
  Filled 2018-03-10 (×12): qty 2

## 2018-03-10 NOTE — Progress Notes (Signed)
Physical Therapy Treatment Patient Details Name: Heather [name] MRN: [id number] DOB: 08-10-1935 Today's Date: 03/10/2018    History of Present Illness 83 year old who was discharged on 02/22/2018 after treatment for acute hypoxic respiratory failure secondary to COPD exacerbation who readmitted on 02/27/2018 with abdominal pain nausea and vomiting and findings suggestive of colitis.  NG tube placed 02/04/18    PT Comments    Assisted OOB to [facility] then amb an increased distance in hallway.  Pt progressing slolwy and will need ST Rehab at SNF prior to home.   Follow Up Recommendations  Supervision for mobility/OOB;SNF     Equipment Recommendations  None recommended by PT    Recommendations for Other Services       Precautions / Restrictions Precautions Precautions: Fall Restrictions Weight Bearing Restrictions: No    Mobility  Bed Mobility Overal bed mobility: Needs Assistance Bed Mobility: Supine to Sit;Sit to Supine     Supine to sit: Supervision;HOB elevated;Min guard Sit to supine: Supervision;HOB elevated;Min guard   General bed mobility comments: increased time and assist lines and tubes plus assist to support B LE up onto bed   Transfers Overall transfer level: Needs assistance Equipment used: Rolling walker (2 wheeled) Transfers: Sit to/from [facility] Sit to Stand: Supervision;Min guard Stand pivot transfers: Supervision;Min guard       General transfer comment: verbal cues for safe technique, assist to rise and steady   also assisted on/odd [facility]   Ambulation/Gait Ambulation/Gait assistance: Min guard;Supervision Ambulation Distance (Feet): 82 Feet Assistive device: Rolling walker (2 wheeled) Gait Pattern/deviations: Step-through pattern;Decreased stride length;Trunk flexed Gait velocity: decreased    General Gait Details: tolerated an increased distance but still limited by ABD pain.  No c/o nausea today    Stairs             Wheelchair Mobility    Modified Rankin (Stroke Patients Only)       Balance                                            Cognition Arousal/Alertness: Awake/alert Behavior During Therapy: WFL for tasks assessed/performed Overall Cognitive Status: Within Functional Limits for tasks assessed                                        Exercises      General Comments        Pertinent Vitals/Pain Pain Assessment: Faces Faces Pain Scale: Hurts little more Pain Location: L arm IV site (Potassium) Pain Descriptors / Indicators: Burning Pain Intervention(s): Monitored during session    Home Living                      Prior Function            PT Goals (current goals can now be found in the care plan section) Progress towards PT goals: Progressing toward goals    Frequency    Min 3X/week      PT Plan Current plan remains appropriate    Co-evaluation              AM-PAC PT "6 Clicks" Daily Activity  Outcome Measure  Difficulty turning over in bed (including adjusting bedclothes, sheets and blankets)?: A Little Difficulty moving from lying on back  to sitting on the side of the bed? : A Little Difficulty sitting down on and standing up from a chair with arms (e.g., wheelchair, bedside commode, etc,.)?: A Little Help needed moving to and from a bed to chair (including a wheelchair)?: A Little Help needed walking in hospital room?: A Little Help needed climbing 3-5 steps with a railing? : A Lot 6 Click Score: 17    End of Session Equipment Utilized During Treatment: Gait belt Activity Tolerance: Patient tolerated treatment well Patient left: in bed;with call bell/phone within reach Nurse Communication: Mobility status PT Visit Diagnosis: Difficulty in walking, not elsewhere classified (R26.2);Muscle weakness (generalized) (M62.81);Unsteadiness on feet (R26.81)     Time: 9371-6967 PT Time Calculation (min) (ACUTE  ONLY): 27 min  Charges:  $Gait Training: 8-22 mins $Therapeutic Activity: 8-22 mins                    G Codes:       Rica Koyanagi  PTA [facility]  Acute  Rehab Pager      [phone]

## 2018-03-10 NOTE — Progress Notes (Addendum)
Heather [name] 1:32 PM  Subjective: Patient with minimal abdominal soreness but no other complaints and did move her bowels without blood or diarrhea and is tolerating clear liquids without nausea or vomiting and has no new complaints  Objective: ital signs stable afebrile no acute distress abdomen is soft rare bowel sounds little soreness no guarding or rebound white count decreased potassium still low  Assessment: Slowly improving  Plan: I discussed a flexible sigmoidoscopy with her to try to delineate the cause of her colitis which is probably ischemic but she is not interested in this time so okay with me to slowly advance diet either later today or tomorrow and will check on tomorrow  [facility] E  Pager [phone] After 5PM or if no answer call [phone]

## 2018-03-10 NOTE — Plan of Care (Signed)
"  I'm so comfortable." plan of care discussed.

## 2018-03-10 NOTE — Progress Notes (Signed)
PROGRESS NOTE    Heather [name]  OVF:[id number] DOB: Mar 04, 1935 DOA: 02/27/2018 PCP: [name], MD    Brief Narrative:  83 year old who was discharged on 02/22/2018 after treatment for acute hypoxic respiratory failure secondary to COPD exacerbation who readmitted on 02/27/2018 with abdominal pain nausea and vomiting and findings suggestive of colitis.  Patient has a history of chronic constipation typically,. Patient also found to have a mild ileus or low-grade bowel obstruction from distal colitis.   Assessment & Plan:   Principal Problem:   Colitis Active Problems:   Hypothyroidism   Dyslipidemia   Anxiety and depression   Essential hypertension   COPD with emphysema (HCC)   Malnutrition of moderate degree   Hypokalemia   Abdominal pain   SBO (small bowel obstruction) (HCC)   Dehydration   Diffuse Colitis with SBO : CT abd on admission shows some dilatation of the CBD with unclear etiology.  HIDA scan not sig for acute cholecystitis.  Surgery and GI  on board She was started on NG tube and later on taken out as bowels started moving.  She remains Afebrile and her leukocytosis is improving.  c diff pcr is negative and gi pathogen pcr is negative.  Currently on IV ciprofloxacin and IV flagyl, plan to transition to oral in am.  IV fluids stopped because of third spacing and ascites, pleural effusions.  Gi recommended flex sigmoidoscopy, but pt wants to defer it at this time.    AKI: Resolved.    Hypertension:  Well controlled.    Hypoalbuminemia:  Third spacing with ascites, pleural effusions.   Hypothyroidism: Resume synthroid.  Change to oral starting from tomorrow   Hypokalemia: replaced. Repeat in am.    Hypomagnesemia replaced repeat is normal.   Anemia of chronic disease Normocytic.  Baseline creatinine around 11 currently her hemoglobin is around 9.7.  Need further workup with sigmoidoscopy by gastroenterology.  Fecal occult blood was  positive.  Anemia panel shows low folate levels, replaced.    DVT prophylaxis: HEPARIN Code Status: FULL CODE.  Family Communication: discussed with son at bedside.  Disposition Plan: pending resolution of colitis.     Consultants:   Surgery  Gastroenterology.    Procedures: nONE.    Antimicrobials: ciprofloxacin and flagyl.  Since admission   Subjective: She is in good spirits , wanted to be discharge in 1 to 2 days.  Advanced her diet.   Objective: Vitals:   03/09/18 2215 03/10/18 0414 03/10/18 0505 03/10/18 0749  BP:  131/64    Pulse:  98    Resp:  16    Temp:  99.3 F (37.4 C)    TempSrc:  Oral    SpO2: 92% 93%  94%  Weight:   55.2 kg (121 lb 11.1 oz)   Height:        Intake/Output Summary (Last 24 hours) at 03/10/2018 0926 Last data filed at 03/10/2018 8416 Gross per 24 hour  Intake 2810.17 ml  Output 1075 ml  Net 1735.17 ml   Filed Weights   03/05/18 0408 03/07/18 0500 03/10/18 0505  Weight: 55.7 kg (122 lb 12.7 oz) 55.5 kg (122 lb 5.7 oz) 55.2 kg (121 lb 11.1 oz)    Examination:  General exam: Appears comfortable sitting in the chair, . Respiratory system: clear to auscultation, no wheezing or rhonchi.  Cardiovascular system: S1 & S2 heard, RRR. No JVD, murmurs,. Trace pedal edema. Gastrointestinal system: abd is soft, mild gen tenderness, bowel sounds good.  Central nervous  system: Alert and oriented. Non focal.  Extremities: trace edema.  Skin: No rashes, lesions or ulcers Psychiatry: Normal mood.    Data Reviewed: I have personally reviewed following labs and imaging studies  CBC: Recent Labs  Lab 03/05/18 0521 03/06/18 0531 03/07/18 0507 03/08/18 0546 03/09/18 0642 03/10/18 0535  WBC 18.2* 14.3* 14.1* 17.1* 14.0* 12.2*  NEUTROABS 13.2 10.0*  --   --  11.5*  --   HGB 13.0 11.5* 10.7* 10.6* 9.7* 9.7*  HCT 40.8 37.0 33.5* 33.1* 30.5* 30.9*  MCV 85.4 84.5 82.9 84.0 85.0 84.7  PLT 319 320 257 298 329 956   Basic Metabolic  Panel: Recent Labs  Lab 03/06/18 0531 03/07/18 0507 03/08/18 0546 03/08/18 1334 03/08/18 1338 03/09/18 0552 03/09/18 1644 03/10/18 0535  NA 137 136 136  --   --  137  --  137  K 3.5 3.4* 2.6* 3.3*  --  3.2*  --  3.1*  CL 103 101 96*  --   --  99*  --  100*  CO2 26 26 30   --   --  29  --  29  GLUCOSE 88 92 93  --   --  113*  --  112*  BUN 6 <5* <5*  --   --  <5*  --  <5*  CREATININE 0.56 0.45 0.48  --   --  0.52  --  0.53  CALCIUM 7.6* 7.7* 7.7*  --   --  7.6*  --  7.5*  MG  --   --   --   --  1.4*  --  1.4*  --    GFR: Estimated Creatinine Clearance: 42.5 mL/min (by C-G formula based on SCr of 0.53 mg/dL). Liver Function Tests: Recent Labs  Lab 03/05/18 0521 03/06/18 0531  AST 25 18  ALT 11* 12*  ALKPHOS 106 91  BILITOT 0.5 0.5  PROT 4.9* 4.4*  ALBUMIN 2.1* 2.0*   No results for input(s): LIPASE, AMYLASE in the last 168 hours. No results for input(s): AMMONIA in the last 168 hours. Coagulation Profile: No results for input(s): INR, PROTIME in the last 168 hours. Cardiac Enzymes: No results for input(s): CKTOTAL, CKMB, CKMBINDEX, TROPONINI in the last 168 hours. BNP (last 3 results) No results for input(s): PROBNP in the last 8760 hours. HbA1C: No results for input(s): HGBA1C in the last 72 hours. CBG: Recent Labs  Lab 03/09/18 1208 03/09/18 1613 03/09/18 1956 03/10/18 0006 03/10/18 0809  GLUCAP 122* 92 98 95 113*   Lipid Profile: No results for input(s): CHOL, HDL, LDLCALC, TRIG, CHOLHDL, LDLDIRECT in the last 72 hours. Thyroid Function Tests: No results for input(s): TSH, T4TOTAL, FREET4, T3FREE, THYROIDAB in the last 72 hours. Anemia Panel: Recent Labs    03/09/18 0642 03/09/18 1644  VITAMINB12  --  451  FOLATE  --  3.4*  FERRITIN  --  103  TIBC  --  189*  IRON  --  45  RETICCTPCT 1.3  --    Sepsis Labs: No results for input(s): PROCALCITON, LATICACIDVEN in the last 168 hours.  Recent Results (from the past 240 hour(s))  Gastrointestinal  Panel by PCR , Stool     Status: None   Collection Time: 03/05/18  8:18 PM  Result Value Ref Range Status   Campylobacter species NOT DETECTED NOT DETECTED Final   Plesimonas shigelloides NOT DETECTED NOT DETECTED Final   Salmonella species NOT DETECTED NOT DETECTED Final   Yersinia enterocolitica NOT DETECTED NOT DETECTED Final  Vibrio species NOT DETECTED NOT DETECTED Final   Vibrio cholerae NOT DETECTED NOT DETECTED Final   Enteroaggregative E coli (EAEC) NOT DETECTED NOT DETECTED Final   Enteropathogenic E coli (EPEC) NOT DETECTED NOT DETECTED Final   Enterotoxigenic E coli (ETEC) NOT DETECTED NOT DETECTED Final   Shiga like toxin producing E coli (STEC) NOT DETECTED NOT DETECTED Final   Shigella/Enteroinvasive E coli (EIEC) NOT DETECTED NOT DETECTED Final   Cryptosporidium NOT DETECTED NOT DETECTED Final   Cyclospora cayetanensis NOT DETECTED NOT DETECTED Final   Entamoeba histolytica NOT DETECTED NOT DETECTED Final   Giardia lamblia NOT DETECTED NOT DETECTED Final   Adenovirus F40/41 NOT DETECTED NOT DETECTED Final   Astrovirus NOT DETECTED NOT DETECTED Final   Norovirus GI/GII NOT DETECTED NOT DETECTED Final   Rotavirus A NOT DETECTED NOT DETECTED Final   Sapovirus (I, II, IV, and V) NOT DETECTED NOT DETECTED Final    Comment: Performed at [facility], [address] [place], [place] [address]  C difficile quick scan w PCR reflex     Status: None   Collection Time: 03/06/18  7:42 AM  Result Value Ref Range Status   C Diff antigen NEGATIVE NEGATIVE Final   C Diff toxin NEGATIVE NEGATIVE Final   C Diff interpretation No C. difficile detected.  Final    Comment: Performed at [facility], [place] [address] [place], [place] [address]         Radiology Studies: Dg Abd Portable 2v  Result Date: 03/08/2018 CLINICAL DATA:  Abdominal discomfort and distension, nausea, small-bowel obstruction EXAM: PORTABLE ABDOMEN - 2 VIEW COMPARISON:  03/07/2018  FINDINGS: Tip of nasogastric tube projects over gastric antrum. Single air-filled loop of bowel in the mid abdomen, suspect transverse colon. Paucity of remaining bowel gas. No definite dilated small bowel loops are visualized. Lung bases emphysematous but clear. Bones demineralized. IMPRESSION: Non-specific bowel gas pattern. Electronically Signed   By: Lavonia Dana M.D.   On: 03/08/2018 18:54        Scheduled Meds: . cholecalciferol  2,000 Units Oral Daily  . folic acid  1 mg Oral Daily  . heparin  5,000 Units Subcutaneous Q8H  . hydrOXYzine  25 mg Intramuscular Once  . iopamidol  30 mL Oral Once  . levothyroxine  25 mcg Oral QAC breakfast  . metoprolol tartrate  5 mg Intravenous Q8H  . mometasone-formoterol  2 puff Inhalation BID  . pantoprazole (PROTONIX) IV  40 mg Intravenous Q24H  . potassium chloride  40 mEq Oral BID  . sertraline  50 mg Oral Daily  . tamsulosin  0.4 mg Oral QPC supper   Continuous Infusions: . ciprofloxacin Stopped (03/10/18 0155)  . dextrose 50 mL/hr at 03/10/18 0429  . metronidazole Stopped (03/10/18 4259)  . potassium chloride       LOS: 11 days    Time spent: 35 minutes.     Hosie Poisson, MD [facility] Pager [phone]  If 7PM-7AM, please contact night-coverage [email or web address] Password [facility] 03/10/2018, 9:26 AM

## 2018-03-10 NOTE — Plan of Care (Signed)
Discussed plan of care with patient and son

## 2018-03-11 NOTE — Care Management Note (Signed)
Case Management Note  Patient Details  Name: Heather [name] MRN: [id number] Date of Birth: 1935/06/10  Subjective/Objective:Patient declines SNF. [facility] will provide [place] @ d/c rep [facility] following-HHRN/PT/OT/aide.                   Action/Plan:d/c plan home w/[facility].   Expected Discharge Date:  03/02/18               Expected Discharge Plan:  [place]  In-House Referral:  Clinical Social Work  Discharge planning Services  CM Consult  Post Acute Care Choice:  Home Health Choice offered to:  Patient  DME Arranged:  N/A DME Agency:  NA  HH Arranged:  RN, PT, Disease Management, OT, Nurse's Aide [place] Agency:  [place]  Status of Service:  Completed, signed off  If discussed at [place] of Stay Meetings, dates discussed:    Additional Comments:  Dessa Phi, RN 03/11/2018, 11:54 AM

## 2018-03-11 NOTE — Progress Notes (Signed)
Patient Demographics:    Heather [name], is a 83 y.o. female, DOB - 17-Jun-1935, SJG:[id number]  Admit date - 02/27/2018   Admitting Physician Ankit Arsenio Loader, MD  Outpatient Primary MD for the patient is [name], MD  LOS - 12   Chief Complaint  Patient presents with  . Abdominal Pain  . Constipation        Subjective:    Heather [name] today has no fevers, no emesis,  No chest pain, barely tolerating liquid diet  Assessment  & Plan :    Principal Problem:   Colitis Active Problems:   Hypothyroidism   Dyslipidemia   Anxiety and depression   Essential hypertension   COPD with emphysema (HCC)   Malnutrition of moderate degree   Hypokalemia   Abdominal pain   SBO (small bowel obstruction) (HCC)   Dehydration  Principal Problem:   Colitis Active Problems:   Hypothyroidism   Dyslipidemia   Anxiety and depression   Essential hypertension   COPD with emphysema (HCC)   Malnutrition of moderate degree   Hypokalemia   Abdominal pain   SBO (small bowel obstruction) ([place])   Dehydration  Brief Narrative:  83 year old who was discharged on 02/22/2018 after treatment for acute hypoxic respiratory failure secondary to COPD exacerbation who readmitted on 02/27/2018 with abdominal pain nausea and vomiting and findings suggestive of colitis. Patient has a history of chronic constipation typically,. Patient also found to have a mild ileus or low-grade bowel obstruction from distal colitis.   Assessment & Plan:   1)Diffuse Colitis with SBO : Colitis may be infectious versus ischemic, currently improving very very slowly, able to tolerate some liquid diet, CT abd on admission shows some dilatation of the CBD with unclear etiology. HIDA scan not sig for acute cholecystitis, Surgery and GI consults appreciated, she was started on NG tube and later on taken out as bowels started  moving. She remains Afebrile and her leukocytosis is improving. c diff pcr is negative and gi pathogen pcr is negative.  Continue IV Cipro and Flagyl, Gi recommended flex sigmoidoscopy, but pt wants to defer it at this time.   2)AKI: Resolved.   3)Hypertension: Stable, continue Cardizem CD 240 mg daily Well controlled. -   4)FEN-moderate protein caloric malnutrition with Hypoalbuminemia:leading to third spacing with ascites, pleural effusions. ,  Nutritional supplements advised  5)Hypothyroidism:-Continue levothyroxine 25 mcg daily  6) chronic anemia -no evidence of ongoing bleeding , baseline hemoglobin usually around 11 ,   Need further workup with sigmoidoscopy by gastroenterology,. Gi recommended flex sigmoidoscopy, but pt wants to defer it at this time.   Fecal occult blood was positive.  Anemia panel shows low folate levels, replaced.    DVT prophylaxis: HEPARIN Code Status: FULL CODE.  Family Communication: discussed with son at bedside.  Disposition Plan: pending resolution of colitis.     Consultants:   Surgery  Gastroenterology.    Procedures: nONE.    Antimicrobials: ciprofloxacin and flagyl.  Since admission   Lab Results  Component Value Date   PLT 334 03/10/2018    Inpatient Medications  Scheduled Meds: . cholecalciferol  2,000 Units Oral Daily  . diltiazem  240 mg Oral Daily  . folic acid  1 mg Oral Daily  .  heparin  5,000 Units Subcutaneous Q8H  . hydrOXYzine  25 mg Intramuscular Once  . iopamidol  30 mL Oral Once  . levothyroxine  25 mcg Oral QAC breakfast  . mometasone-formoterol  2 puff Inhalation BID  . pantoprazole (PROTONIX) IV  40 mg Intravenous Q24H  . potassium chloride  40 mEq Oral BID  . sertraline  50 mg Oral Daily  . tamsulosin  0.4 mg Oral QPC supper  . tiotropium  1 capsule Inhalation Daily   Continuous Infusions: . ciprofloxacin Stopped (03/11/18 1445)  . dextrose 50 mL/hr at 03/11/18 0934  . metronidazole Stopped  (03/11/18 1605)   PRN Meds:.albuterol, benzonatate, clorazepate, dextrose, hydrOXYzine, menthol-cetylpyridinium, ondansetron **OR** ondansetron (ZOFRAN) IV, phenol, polyvinyl alcohol, promethazine, senna-docusate, traMADol    Anti-infectives (From admission, onward)   Start     Dose/Rate Route Frequency Ordered Stop   03/04/18 1400  metroNIDAZOLE (FLAGYL) IVPB 500 mg     500 mg 100 mL/hr over 60 Minutes Intravenous Every 8 hours 03/04/18 1211     03/04/18 1300  ciprofloxacin (CIPRO) IVPB 400 mg     400 mg 200 mL/hr over 60 Minutes Intravenous Every 12 hours 03/04/18 1211     02/28/18 0200  piperacillin-tazobactam (ZOSYN) IVPB 3.375 g  Status:  Discontinued     3.375 g 12.5 mL/hr over 240 Minutes Intravenous Every 8 hours 02/27/18 1753 03/04/18 1208   02/27/18 1745  piperacillin-tazobactam (ZOSYN) IVPB 3.375 g     3.375 g 100 mL/hr over 30 Minutes Intravenous STAT 02/27/18 1735 02/28/18 1745   02/27/18 1645  ciprofloxacin (CIPRO) IVPB 400 mg  Status:  Discontinued     400 mg 200 mL/hr over 60 Minutes Intravenous  Once 02/27/18 1643 02/27/18 1734   02/27/18 1645  metroNIDAZOLE (FLAGYL) IVPB 500 mg  Status:  Discontinued     500 mg 100 mL/hr over 60 Minutes Intravenous  Once 02/27/18 1643 02/27/18 1734        Objective:   Vitals:   03/11/18 0853 03/11/18 0900 03/11/18 0959 03/11/18 1354  BP: 115/61  (!) 114/40 (!) 109/43  Pulse: 75  87 81  Resp: 16  15 14   Temp: 97.6 F (36.4 C)   98.3 F (36.8 C)  TempSrc: Oral   Oral  SpO2: 97% 92% 93% 95%  Weight:      Height:        Wt Readings from Last 3 Encounters:  03/11/18 51.6 kg (113 lb 12.1 oz)  02/19/18 47.2 kg (104 lb)  02/19/18 47.4 kg (104 lb 8 oz)     Intake/Output Summary (Last 24 hours) at 03/11/2018 1951 Last data filed at 03/11/2018 1839 Gross per 24 hour  Intake 2298.33 ml  Output 650 ml  Net 1648.33 ml     Physical Exam  Gen:- Awake Alert,  In no apparent distress  HEENT:- [employer].AT, No sclera  icterus Neck-Supple Neck,No JVD,.  Lungs-  CTAB no wheezing CV- S1, S2 normal Abd-  +ve B.Sounds, Abd Soft, No tenderness,    Extremity/Skin:- No  edema,   good pulses Psych-affect is appropriate, oriented x3 Neuro-no new focal deficits, no tremors   Data Review:   Micro Results Recent Results (from the past 240 hour(s))  Gastrointestinal Panel by PCR , Stool     Status: None   Collection Time: 03/05/18  8:18 PM  Result Value Ref Range Status   Campylobacter species NOT DETECTED NOT DETECTED Final   Plesimonas shigelloides NOT DETECTED NOT DETECTED Final   Salmonella species NOT  DETECTED NOT DETECTED Final   Yersinia enterocolitica NOT DETECTED NOT DETECTED Final   Vibrio species NOT DETECTED NOT DETECTED Final   Vibrio cholerae NOT DETECTED NOT DETECTED Final   Enteroaggregative E coli (EAEC) NOT DETECTED NOT DETECTED Final   Enteropathogenic E coli (EPEC) NOT DETECTED NOT DETECTED Final   Enterotoxigenic E coli (ETEC) NOT DETECTED NOT DETECTED Final   Shiga like toxin producing E coli (STEC) NOT DETECTED NOT DETECTED Final   Shigella/Enteroinvasive E coli (EIEC) NOT DETECTED NOT DETECTED Final   Cryptosporidium NOT DETECTED NOT DETECTED Final   Cyclospora cayetanensis NOT DETECTED NOT DETECTED Final   Entamoeba histolytica NOT DETECTED NOT DETECTED Final   Giardia lamblia NOT DETECTED NOT DETECTED Final   Adenovirus F40/41 NOT DETECTED NOT DETECTED Final   Astrovirus NOT DETECTED NOT DETECTED Final   Norovirus GI/GII NOT DETECTED NOT DETECTED Final   Rotavirus A NOT DETECTED NOT DETECTED Final   Sapovirus (I, II, IV, and V) NOT DETECTED NOT DETECTED Final    Comment: Performed at [facility], [address] [place], [place] [address]  C difficile quick scan w PCR reflex     Status: None   Collection Time: 03/06/18  7:42 AM  Result Value Ref Range Status   C Diff antigen NEGATIVE NEGATIVE Final   C Diff toxin NEGATIVE NEGATIVE Final   C Diff interpretation No C.  difficile detected.  Final    Comment: Performed at [facility], [place] [address], [place], [place] [address]    Radiology Reports Dg Chest 2 View  Result Date: 02/19/2018 CLINICAL DATA:  Shortness of Breath EXAM: CHEST - 2 VIEW COMPARISON:  None. FINDINGS: The heart size and mediastinal contours are within normal limits. Both lungs are clear. The visualized skeletal structures are unremarkable. IMPRESSION: No active cardiopulmonary disease. Electronically Signed   By: Inez Catalina M.D.   On: 02/19/2018 16:08   Nm Hepatobiliary Liver Func  Result Date: 03/05/2018 CLINICAL DATA:  Abdominal pain with nausea and vomiting. Distended gallbladder. EXAM: NUCLEAR MEDICINE HEPATOBILIARY IMAGING TECHNIQUE: Sequential images of the abdomen were obtained out to 60 minutes following intravenous administration of radiopharmaceutical. RADIOPHARMACEUTICALS:  5.19 mCi Tc-13m  Choletec IV COMPARISON:  CT abdomen and pelvis March 04, 2018 FINDINGS: Liver uptake of radiotracer is unremarkable. There is visualization of gallbladder and small bowel, indicating patency of the cystic and common bile ducts. The patient was unable to tolerate Ensure consumption to facilitate calculation of the computer generated ejection fraction of radiotracer from the gallbladder. IMPRESSION: Cystic and common bile ducts are patent, evidenced by visualization of gallbladder and small bowel. Gallbladder is noted to be distended on recent CT. Patient unable to tolerate Ensure to facilitate calculation of the computer generated ejection fraction of radiotracer from gallbladder. Electronically Signed   By: Lowella Grip III M.D.   On: 03/05/2018 16:53   Ct Abdomen Pelvis W Contrast  Result Date: 03/04/2018 CLINICAL DATA:  Abdominal pain, prior distal colitis EXAM: CT ABDOMEN AND PELVIS WITH CONTRAST TECHNIQUE: Multidetector CT imaging of the abdomen and pelvis was performed using the standard protocol following bolus  administration of intravenous contrast. CONTRAST:  111mL ISOVUE-300 IOPAMIDOL (ISOVUE-300) INJECTION 61%, 22mL ISOVUE-300 IOPAMIDOL (ISOVUE-300) INJECTION 61% COMPARISON:  02/27/2018 FINDINGS: Despite efforts by the technologist and patient, motion artifact is present on today's exam and could not be eliminated. This reduces exam sensitivity and specificity. Lower chest: Small new bilateral pleural effusions with passive atelectasis. These effusions are nonspecific for transudative or  exudative etiology. Borderline wall thickening of the distal thoracic esophagus. Hepatobiliary: Abnormally distended gallbladder, 5.4 cm in diameter. The common bile duct measures 1.2 cm in diameter and previously was only 0.5 cm in diameter. Pancreas: Mild dorsal pancreatic duct dilatation in the pancreatic body and head, increased from prior. Spleen: Unremarkable Adrenals/Urinary Tract: Unremarkable. Foley catheter in the urinary bladder. Stomach/Bowel: No obvious mass in the vicinity of the ampulla The colon is moderately distended with abnormal wall thickening extending from the distal transverse colon through to the sigmoid colon. Mildly accentuated associated enhancement. Air fluid levels in the distal colon compatible with diarrheal process. There is sigmoid colon diverticulosis without appreciable active diverticulitis. Along the anterior wall of the descending colon, there several small patches of relative mucosal hypoenhancement with some exaggerated stranding along the margins, for example on images 94 through 101 of series 6. Small caliber terminal ileum particularly compared to the prominent colon. Small bowel loops are relatively unremarkable and the rectum appears unremarkable. Vascular/Lymphatic: Aortoiliac atherosclerotic vascular disease. Patent celiac trunk, SMA, and inferior mesenteric artery. No pathologic adenopathy identified. Reproductive: Uterus absent.  Adnexa unremarkable. Other: Scattered moderate ascites.  No free intraperitoneal gas is identified. Musculoskeletal: Bilateral flank edema increased from 02/27/2018. There is edema tracking along the upper thigh musculature, right greater than left. IMPRESSION: 1. Abnormal appearance of the colon, once again with distended colon, distal air-fluid levels indicating diarrheal process, and abnormal accentuated enhancement of the colon wall in the descending colon and proximal sigmoid. There are 2 regions of anterior hypoenhancement along the affected colon wall, of uncertain significance. There is sigmoid diverticulosis without active diverticulitis. Overall the appearance is compatible with colitis and could be due to prominent infectious colitis or inflammatory bowel disease such as Crohn's colitis. The central mesenteric vessels are patent making ischemic colitis less likely but not totally excluded. There is new scattered scattered moderate ascites including ascites around the descending colon along with inflammatory stranding. 2. Along with the ascites, there small bilateral pleural effusions and bilateral flank edema tracking into the upper thighs indicating third spacing of fluids. 3. Newly abnormally distended gallbladder, 5.4 cm in diameter, with new dilatation of the common bile duct and of part of the dorsal pancreatic duct. I do not see an obvious lesion in the vicinity of the ampulla. The possibility of occult choledocholithiasis or occult ampullary lesion is raised. If assessment for acute cholecystitis is warranted, hepatobiliary nuclear medicine scan can be used to assess cystic duct patency. There is no current significant intrahepatic biliary dilatation. 4.  Aortic Atherosclerosis (ICD10-I70.0). Electronically Signed   By: Van Clines M.D.   On: 03/04/2018 17:04   Ct Abdomen Pelvis W Contrast  Result Date: 02/27/2018 CLINICAL DATA:  Generalized abdominal pain with nausea.  Vomiting. EXAM: CT ABDOMEN AND PELVIS WITH CONTRAST TECHNIQUE:  Multidetector CT imaging of the abdomen and pelvis was performed using the standard protocol following bolus administration of intravenous contrast. CONTRAST:  48mL ISOVUE-300 IOPAMIDOL (ISOVUE-300) INJECTION 61% COMPARISON:  November 17, 2015 FINDINGS: Lower chest: Mild opacity in the right base dependently is favored to represent atelectasis. Emphysematous changes in the lung bases. Fluid in the distal esophagus. Coronary artery calcifications. Lung bases otherwise normal. Hepatobiliary: Hepatic steatosis. No other acute liver abnormalities identified. The gallbladder is mildly distended but demonstrates no wall thickening, pericholecystic fluid, or stones. The portal vein is patent. Pancreas: Unremarkable. No pancreatic ductal dilatation or surrounding inflammatory changes. Spleen: Normal in size without focal abnormality. Adrenals/Urinary Tract: The adrenal glands are normal.  A few tiny renal cysts are noted. The kidneys are otherwise normal. The ureters and bladder are normal. Stomach/Bowel: The stomach and small bowel are normal. Colonic diverticuli are identified. There is mucosal thickening associated with the colon, most prominent in the descending colon and proximal sigmoid colon. There is also mild wall thickening in the proximal descending colon and proximal sigmoid colon with mild adjacent stranding. There is also stranding in the left pericolic gutter, likely secondary to the colonic process. Proximal to the splenic flexure, the colon is fluid-filled, thin walled, and mildly prominent in caliber measuring up to 6 cm. The appendix is not visualized. No secondary evidence of appendicitis. The terminal ileum is normal. Vascular/Lymphatic: Atherosclerotic changes are seen in the nonaneurysmal aorta, iliac vessels common femoral vessels. No adenopathy. Reproductive: Status post hysterectomy. No adnexal masses. Other: No abdominal wall hernia or abnormality. No abdominopelvic ascites. Musculoskeletal: There  is scalloping of the inferior endplate of L3 which is new since December 2016 but does not appear to be acute. The bones are otherwise normal. IMPRESSION: 1. Mucosal enhancement and mild wall thickening associated with the descending and sigmoid colon is consistent with colitis. This could be infectious, inflammatory, or the ischemic. That being said, the IMA and SMA both enhance normally. Recommend clinical correlation and attention on follow-up. 2. Proximal to the splenic flexure, the colon is borderline in caliber and fluid-filled suggesting ileus or mild relative low-grade obstruction from the more distal colitis. No small bowel dilatation. 3. Mild opacity in the right lung base is favored to represent atelectasis rather than pneumonia. 4. Fluid in the esophagus consistent with reflux. 5. Emphysematous changes in the lung bases. 6. Atherosclerosis in the aorta. 7. Scalloping of the inferior endplate of L3, new since 2016. I suspect this is nonacute based on appearance. Electronically Signed   By: Dorise Bullion III M.D   On: 02/27/2018 15:06   Dg Abd 2 Views  Result Date: 03/07/2018 CLINICAL DATA:  Nasogastric tube placement EXAM: ABDOMEN - 2 VIEW COMPARISON:  None. FINDINGS: Nasogastric tube with the tip projecting over the stomach. There is no bowel dilatation to suggest obstruction. There is no evidence of pneumoperitoneum, portal venous gas or pneumatosis. There are no pathologic calcifications along the expected course of the ureters. Trace bilateral pleural effusions. The osseous structures are unremarkable. IMPRESSION: Nasogastric tube with the tip projecting over the stomach. Electronically Signed   By: Kathreen Devoid   On: 03/07/2018 10:58   Dg Abd Acute W/chest  Result Date: 03/02/2018 CLINICAL DATA:  Nausea and vomiting EXAM: DG ABDOMEN ACUTE W/ 1V CHEST COMPARISON:  Abdominal and pelvic CT scan of February 27, 2018 and chest x-ray of February 19, 2018. FINDINGS: The lungs are adequately inflated.  There is a small amount of pleural fluid blunting the right lateral costophrenic angle. There is no alveolar infiltrate. The heart and pulmonary vascularity are normal. There is calcification in the wall of the aortic arch. The bony thorax exhibits no acute abnormality. Within the abdomen the colonic and rectal stool burden is moderate. There are a few loops of mildly distended gas-filled small bowel in the mid and lower abdomen. The bony structures are unremarkable. There are no abnormal soft tissue calcification. IMPRESSION: No bowel obstruction nor ileus. Mild gaseous distention of several small bowel loops may reflect a mild ileus. Moderate colonic stool burden but no fecal impaction. Small right pleural effusion new since the previous study. No focal pneumonia underlying COPD. Thoracic aortic atherosclerosis. Electronically Signed  By: David  Martinique M.D.   On: 03/02/2018 09:09   Dg Abd Portable 2v  Result Date: 03/08/2018 CLINICAL DATA:  Abdominal discomfort and distension, nausea, small-bowel obstruction EXAM: PORTABLE ABDOMEN - 2 VIEW COMPARISON:  03/07/2018 FINDINGS: Tip of nasogastric tube projects over gastric antrum. Single air-filled loop of bowel in the mid abdomen, suspect transverse colon. Paucity of remaining bowel gas. No definite dilated small bowel loops are visualized. Lung bases emphysematous but clear. Bones demineralized. IMPRESSION: Non-specific bowel gas pattern. Electronically Signed   By: Lavonia Dana M.D.   On: 03/08/2018 18:54     CBC Recent Labs  Lab 03/05/18 0521 03/06/18 0531 03/07/18 0507 03/08/18 0546 03/09/18 0642 03/10/18 0535  WBC 18.2* 14.3* 14.1* 17.1* 14.0* 12.2*  HGB 13.0 11.5* 10.7* 10.6* 9.7* 9.7*  HCT 40.8 37.0 33.5* 33.1* 30.5* 30.9*  PLT 319 320 257 298 329 334  MCV 85.4 84.5 82.9 84.0 85.0 84.7  MCH 27.2 26.3 26.5 26.9 27.0 26.6  MCHC 31.9 31.1 31.9 32.0 31.8 31.4  RDW 16.0* 16.0* 16.3* 16.3* 16.8* 17.3*  LYMPHSABS 1.9 2.4  --   --  1.4  --     MONOABS 3.0 1.9*  --   --  1.1*  --   EOSABS 0.0 0.0  --   --  0.0  --   BASOSABS 0.1 0.0  --   --  0.0  --     Chemistries  Recent Labs  Lab 03/05/18 0521 03/06/18 0531 03/07/18 0507 03/08/18 0546 03/08/18 1334 03/08/18 1338 03/09/18 0552 03/09/18 1644 03/10/18 0535  NA 134* 137 136 136  --   --  137  --  137  K 3.1* 3.5 3.4* 2.6* 3.3*  --  3.2*  --  3.1*  CL 101 103 101 96*  --   --  99*  --  100*  CO2 22 26 26 30   --   --  29  --  29  GLUCOSE 94 88 92 93  --   --  113*  --  112*  BUN <5* 6 <5* <5*  --   --  <5*  --  <5*  CREATININE 0.48 0.56 0.45 0.48  --   --  0.52  --  0.53  CALCIUM 7.1* 7.6* 7.7* 7.7*  --   --  7.6*  --  7.5*  MG  --   --   --   --   --  1.4*  --  1.4* 2.1  AST 25 18  --   --   --   --   --   --   --   ALT 11* 12*  --   --   --   --   --   --   --   ALKPHOS 106 91  --   --   --   --   --   --   --   BILITOT 0.5 0.5  --   --   --   --   --   --   --    ------------------------------------------------------------------------------------------------------------------ No results for input(s): CHOL, HDL, LDLCALC, TRIG, CHOLHDL, LDLDIRECT in the last 72 hours.  No results found for: HGBA1C ------------------------------------------------------------------------------------------------------------------ No results for input(s): TSH, T4TOTAL, T3FREE, THYROIDAB in the last 72 hours.  Invalid input(s): FREET3 ------------------------------------------------------------------------------------------------------------------ Recent Labs    03/09/18 0642 03/09/18 1644  VITAMINB12  --  451  FOLATE  --  3.4*  FERRITIN  --  103  TIBC  --  189*  IRON  --  45  RETICCTPCT 1.3  --     Coagulation profile No results for input(s): INR, PROTIME in the last 168 hours.  No results for input(s): DDIMER in the last 72 hours.  Cardiac Enzymes No results for input(s): CKMB, TROPONINI, MYOGLOBIN in the last 168 hours.  Invalid input(s):  CK ------------------------------------------------------------------------------------------------------------------    Component Value Date/Time   BNP 128.7 (H) 01/28/2018 1422     Roxan Hockey M.D on 03/11/2018 at 7:51 PM  Between 7am to 7pm - Pager - [phone]  After 7pm go to [email or web address] - password [email or web address]  [facility] -  Office  [phone]   Voice Recognition Viviann Spare dictation system was used to create this note, attempts have been made to correct errors. Please contact the author with questions and/or clarifications.

## 2018-03-11 NOTE — Progress Notes (Signed)
Heather [name] 6:27 PM  Subjective: Patient is doing about the samewithout any new complaints and tolerating full liquids and no nausea vomiting or bowel movement today  Objective: Vital signs stable afebrile no acute distress abdomen slightly distended mildly tender left lower quadrant mostly no guarding or rebound rare bowel sound no new labs  Assessment: Colitis slow to improve probably ischemic  Plan: pLease call one of my partners this weekend if we could be of any further assistance and one of [place] is happy to see back in follow-up if needed and we will recheck labs tomorrow just to be sure  [facility] E  Pager [phone] After 5PM or if no answer call [phone]

## 2018-03-11 NOTE — Progress Notes (Addendum)
CSW met with patient at bedside, explained role and reason for visit to assist with discharge to SNF.  Patient states, "I plan to go home when I leave here." Patient reports she has services at home already but could not remember the name of the company. Patient reports her son lives in [place] and is accessible.  CSW spoke to the patient son he reports the patient has been referred to the [facility] program and will meet with the [facility] staff once pt. Is discharge to discuss the pt. Care plan.  Case Manager is aware.  CSW signing off.    , LCSWA, MSW Clinical Social Worker  [phone] 03/11/2018  10:25 AM 

## 2018-03-12 LAB — CBC WITH DIFFERENTIAL/PLATELET
Basophils Absolute: 0 10*3/uL (ref 0.0–0.1)
Basophils Relative: 0 %
Eosinophils Absolute: 0 10*3/uL (ref 0.0–0.7)
Eosinophils Relative: 0 %
HCT: 28.5 % — ABNORMAL LOW (ref 36.0–46.0)
Hemoglobin: 8.9 g/dL — ABNORMAL LOW (ref 12.0–15.0)
Lymphocytes Relative: 13 %
Lymphs Abs: 1.2 10*3/uL (ref 0.7–4.0)
MCH: 26.7 pg (ref 26.0–34.0)
MCHC: 31.2 g/dL (ref 30.0–36.0)
MCV: 85.6 fL (ref 78.0–100.0)
Monocytes Absolute: 1.3 10*3/uL — ABNORMAL HIGH (ref 0.1–1.0)
Monocytes Relative: 13 %
Neutro Abs: 7.2 10*3/uL (ref 1.7–7.7)
Neutrophils Relative %: 74 %
Platelets: 369 10*3/uL (ref 150–400)
RBC: 3.33 MIL/uL — ABNORMAL LOW (ref 3.87–5.11)
RDW: 18 % — ABNORMAL HIGH (ref 11.5–15.5)
WBC: 9.7 10*3/uL (ref 4.0–10.5)

## 2018-03-12 LAB — COMPREHENSIVE METABOLIC PANEL
ALT: 10 U/L — ABNORMAL LOW (ref 14–54)
AST: 14 U/L — ABNORMAL LOW (ref 15–41)
Albumin: 2.4 g/dL — ABNORMAL LOW (ref 3.5–5.0)
Alkaline Phosphatase: 60 U/L (ref 38–126)
Anion gap: 8 (ref 5–15)
BUN: <5 mg/dL — ABNORMAL LOW (ref 6–20)
CO2: 25 mmol/L (ref 22–32)
Calcium: 8.2 mg/dL — ABNORMAL LOW (ref 8.9–10.3)
Chloride: 104 mmol/L (ref 101–111)
Creatinine, Ser: 0.59 mg/dL (ref 0.44–1.00)
GFR calc Af Amer: >60 mL/min (ref >60)
GFR calc non Af Amer: >60 mL/min (ref >60)
Glucose, Bld: 100 mg/dL — ABNORMAL HIGH (ref 65–99)
Potassium: 4.1 mmol/L (ref 3.5–5.1)
Sodium: 137 mmol/L (ref 135–145)
Total Bilirubin: 0.3 mg/dL (ref 0.3–1.2)
Total Protein: 4.9 g/dL — ABNORMAL LOW (ref 6.5–8.1)

## 2018-03-12 MED ORDER — ADULT MULTIVITAMIN W/MINERALS CH
1.0000 | ORAL_TABLET | Freq: Every day | ORAL | Status: DC
  Administered 2018-03-12 – 2018-03-13 (×2): 1 via ORAL
  Filled 2018-03-12 (×2): qty 1

## 2018-03-12 MED ORDER — PANTOPRAZOLE SODIUM 40 MG PO TBEC
40.0000 mg | DELAYED_RELEASE_TABLET | Freq: Every day | ORAL | Status: DC
  Administered 2018-03-12 – 2018-03-13 (×2): 40 mg via ORAL
  Filled 2018-03-12 (×2): qty 1

## 2018-03-12 MED ORDER — ENSURE ENLIVE PO LIQD
237.0000 mL | Freq: Two times a day (BID) | ORAL | Status: DC
  Administered 2018-03-13: 10:00:00 237 mL via ORAL

## 2018-03-12 NOTE — Care Management Important Message (Signed)
Important Message  Patient Details  Name: Heather [name] MRN: [id number] Date of Birth: [birth year]   Medicare Important Message Given:  Yes    Kerin Salen 03/12/2018, 12:01 [place] Message  Patient Details  Name: Heather [name] MRN: [id number] Date of Birth: 20-May-1935   Medicare Important Message Given:  Yes    Kerin Salen 03/12/2018, 12:00 [place] Message  Patient Details  Name: Heather [name] MRN: [id number] Date of Birth: 02/19/1935   Medicare Important Message Given:  Yes    Kerin Salen 03/12/2018, 11:59 AM

## 2018-03-12 NOTE — Progress Notes (Signed)
Patient Demographics:    Heather [name], is a 83 y.o. female, DOB - [birth year], GQB:[id number]  Admit date - 02/27/2018   Admitting Physician Ankit Arsenio Loader, MD  Outpatient Primary MD for the patient is [name], MD  LOS - 7   Chief Complaint  Patient presents with  . Abdominal Pain  . Constipation        Subjective:    Heather [name] today has no fevers, no emesis,  No chest pain, son at bedside  Assessment  & Plan :    Principal Problem:   Colitis Active Problems:   Hypothyroidism   Dyslipidemia   Anxiety and depression   Essential hypertension   COPD with emphysema (HCC)   Malnutrition of moderate degree   Hypokalemia   Abdominal pain   SBO (small bowel obstruction) (HCC)   Dehydration  Principal Problem:   Colitis Active Problems:   Hypothyroidism   Dyslipidemia   Anxiety and depression   Essential hypertension   COPD with emphysema (HCC)   Malnutrition of moderate degree   Hypokalemia   Abdominal pain   SBO (small bowel obstruction) ([place])   Dehydration  Brief Narrative:  83 year old who was discharged on 02/22/2018 after treatment for acute hypoxic respiratory failure secondary to COPD exacerbation who readmitted on 02/27/2018 with abdominal pain nausea and vomiting and findings suggestive of colitis. Patient has a history of chronic constipation typically,. Patient also found to have a mild ileus or low-grade bowel obstruction from distal colitis.   Assessment & Plan:   1)Diffuse Colitis with SBO : Colitis may be infectious versus ischemic, currently improving very very slowly, tolerating full liquid diet, will advance to GI soft diet, CT abd on admission shows some dilatation of the CBD with unclear etiology. HIDA scan not sig for acute cholecystitis, Surgery and GI consults appreciated, she was started on NG tube and later on taken out as bowels  started moving. She remains Afebrile and her leukocytosis is improving. c diff pcr is negative and gi pathogen pcr is negative.  Continue IV Cipro and Flagyl, Gi recommended flex sigmoidoscopy, but pt wants to defer it at this time.   2)AKI: Resolved.   3)Hypertension: Stable, continue Cardizem CD 240 mg daily Well controlled. -   4)FEN-moderate protein caloric malnutrition with Hypoalbuminemia:leading to third spacing with ascites, pleural effusions. ,  Nutritional supplements advised  5)Hypothyroidism:-Continue levothyroxine 25 mcg daily  6) chronic anemia -no evidence of ongoing bleeding , baseline hemoglobin usually around 11 ,   Need further workup with sigmoidoscopy by gastroenterology,. Gi recommended flex sigmoidoscopy, but pt wants to defer it at this time.   Fecal occult blood was positive.  Anemia panel shows low folate levels, replaced.  7)Disposition-plan of care discussed with patient and son at bedside, patient refuses to go to skilled nursing facility for rehab, she would like to go home with home health services possibly on 03/13/2018, patient has generalized weakness and debility, she is starting to eat slightly better   DVT prophylaxis: HEPARIN Code Status: FULL CODE.  Family Communication: discussed with son at bedside.  Disposition Plan: pending resolution of colitis.     Consultants:   Surgery  Gastroenterology.    Procedures: nONE.    Antimicrobials: ciprofloxacin and  flagyl.  Since admission   Lab Results  Component Value Date   PLT 369 03/12/2018    Inpatient Medications  Scheduled Meds: . cholecalciferol  2,000 Units Oral Daily  . diltiazem  240 mg Oral Daily  . feeding supplement (ENSURE ENLIVE)  237 mL Oral BID BM  . folic acid  1 mg Oral Daily  . heparin  5,000 Units Subcutaneous Q8H  . hydrOXYzine  25 mg Intramuscular Once  . iopamidol  30 mL Oral Once  . levothyroxine  25 mcg Oral QAC breakfast  . mometasone-formoterol  2 puff  Inhalation BID  . multivitamin with minerals  1 tablet Oral Daily  . pantoprazole  40 mg Oral Daily  . potassium chloride  40 mEq Oral BID  . sertraline  50 mg Oral Daily  . tamsulosin  0.4 mg Oral QPC supper  . tiotropium  1 capsule Inhalation Daily   Continuous Infusions: . ciprofloxacin Stopped (03/12/18 1353)  . dextrose 50 mL/hr at 03/12/18 0549  . metronidazole Stopped (03/12/18 1518)   PRN Meds:.albuterol, benzonatate, clorazepate, dextrose, hydrOXYzine, menthol-cetylpyridinium, ondansetron **OR** ondansetron (ZOFRAN) IV, phenol, polyvinyl alcohol, promethazine, senna-docusate, traMADol    Anti-infectives (From admission, onward)   Start     Dose/Rate Route Frequency Ordered Stop   03/04/18 1400  metroNIDAZOLE (FLAGYL) IVPB 500 mg     500 mg 100 mL/hr over 60 Minutes Intravenous Every 8 hours 03/04/18 1211     03/04/18 1300  ciprofloxacin (CIPRO) IVPB 400 mg     400 mg 200 mL/hr over 60 Minutes Intravenous Every 12 hours 03/04/18 1211     02/28/18 0200  piperacillin-tazobactam (ZOSYN) IVPB 3.375 g  Status:  Discontinued     3.375 g 12.5 mL/hr over 240 Minutes Intravenous Every 8 hours 02/27/18 1753 03/04/18 1208   02/27/18 1745  piperacillin-tazobactam (ZOSYN) IVPB 3.375 g     3.375 g 100 mL/hr over 30 Minutes Intravenous STAT 02/27/18 1735 02/28/18 1745   02/27/18 1645  ciprofloxacin (CIPRO) IVPB 400 mg  Status:  Discontinued     400 mg 200 mL/hr over 60 Minutes Intravenous  Once 02/27/18 1643 02/27/18 1734   02/27/18 1645  metroNIDAZOLE (FLAGYL) IVPB 500 mg  Status:  Discontinued     500 mg 100 mL/hr over 60 Minutes Intravenous  Once 02/27/18 1643 02/27/18 1734        Objective:   Vitals:   03/12/18 0500 03/12/18 0746 03/12/18 1014 03/12/18 1443  BP:   (!) 119/41 (!) 110/50  Pulse:   80 92  Resp:    16  Temp:    98.6 F (37 C)  TempSrc:    Oral  SpO2:  95%  96%  Weight: 50.8 kg (111 lb 15.9 oz)     Height:        Wt Readings from Last 3 Encounters:    03/12/18 50.8 kg (111 lb 15.9 oz)  02/19/18 47.2 kg (104 lb)  02/19/18 47.4 kg (104 lb 8 oz)     Intake/Output Summary (Last 24 hours) at 03/12/2018 1920 Last data filed at 03/12/2018 1443 Gross per 24 hour  Intake 676.67 ml  Output 300 ml  Net 376.67 ml     Physical Exam  Gen:- Awake Alert,  In no apparent distress  HEENT:- .AT, No sclera icterus Neck-Supple Neck,No JVD,.  Lungs-  CTAB no wheezing CV- S1, S2 normal Abd-  +ve B.Sounds, Abd Soft, No tenderness,    Extremity/Skin:- No  edema,   good pulses Psych-affect is  appropriate, oriented x3 Neuro-no new focal deficits, no tremors   Data Review:   Micro Results Recent Results (from the past 240 hour(s))  Gastrointestinal Panel by PCR , Stool     Status: None   Collection Time: 03/05/18  8:18 PM  Result Value Ref Range Status   Campylobacter species NOT DETECTED NOT DETECTED Final   Plesimonas shigelloides NOT DETECTED NOT DETECTED Final   Salmonella species NOT DETECTED NOT DETECTED Final   Yersinia enterocolitica NOT DETECTED NOT DETECTED Final   Vibrio species NOT DETECTED NOT DETECTED Final   Vibrio cholerae NOT DETECTED NOT DETECTED Final   Enteroaggregative E coli (EAEC) NOT DETECTED NOT DETECTED Final   Enteropathogenic E coli (EPEC) NOT DETECTED NOT DETECTED Final   Enterotoxigenic E coli (ETEC) NOT DETECTED NOT DETECTED Final   Shiga like toxin producing E coli (STEC) NOT DETECTED NOT DETECTED Final   Shigella/Enteroinvasive E coli (EIEC) NOT DETECTED NOT DETECTED Final   Cryptosporidium NOT DETECTED NOT DETECTED Final   Cyclospora cayetanensis NOT DETECTED NOT DETECTED Final   Entamoeba histolytica NOT DETECTED NOT DETECTED Final   Giardia lamblia NOT DETECTED NOT DETECTED Final   Adenovirus F40/41 NOT DETECTED NOT DETECTED Final   Astrovirus NOT DETECTED NOT DETECTED Final   Norovirus GI/GII NOT DETECTED NOT DETECTED Final   Rotavirus A NOT DETECTED NOT DETECTED Final   Sapovirus (I, II, IV, and V)  NOT DETECTED NOT DETECTED Final    Comment: Performed at [facility], [address] [place], [place] [address]  C difficile quick scan w PCR reflex     Status: None   Collection Time: 03/06/18  7:42 AM  Result Value Ref Range Status   C Diff antigen NEGATIVE NEGATIVE Final   C Diff toxin NEGATIVE NEGATIVE Final   C Diff interpretation No C. difficile detected.  Final    Comment: Performed at [facility], [place] [address], [place], [place] [address]    Radiology Reports Dg Chest 2 View  Result Date: 02/19/2018 CLINICAL DATA:  Shortness of Breath EXAM: CHEST - 2 VIEW COMPARISON:  None. FINDINGS: The heart size and mediastinal contours are within normal limits. Both lungs are clear. The visualized skeletal structures are unremarkable. IMPRESSION: No active cardiopulmonary disease. Electronically Signed   By: Inez Catalina M.D.   On: 02/19/2018 16:08   Nm Hepatobiliary Liver Func  Result Date: 03/05/2018 CLINICAL DATA:  Abdominal pain with nausea and vomiting. Distended gallbladder. EXAM: NUCLEAR MEDICINE HEPATOBILIARY IMAGING TECHNIQUE: Sequential images of the abdomen were obtained out to 60 minutes following intravenous administration of radiopharmaceutical. RADIOPHARMACEUTICALS:  5.19 mCi Tc-87m  Choletec IV COMPARISON:  CT abdomen and pelvis March 04, 2018 FINDINGS: Liver uptake of radiotracer is unremarkable. There is visualization of gallbladder and small bowel, indicating patency of the cystic and common bile ducts. The patient was unable to tolerate Ensure consumption to facilitate calculation of the computer generated ejection fraction of radiotracer from the gallbladder. IMPRESSION: Cystic and common bile ducts are patent, evidenced by visualization of gallbladder and small bowel. Gallbladder is noted to be distended on recent CT. Patient unable to tolerate Ensure to facilitate calculation of the computer generated ejection fraction of radiotracer from  gallbladder. Electronically Signed   By: Lowella Grip III M.D.   On: 03/05/2018 16:53   Ct Abdomen Pelvis W Contrast  Result Date: 03/04/2018 CLINICAL DATA:  Abdominal pain, prior distal colitis EXAM: CT ABDOMEN AND PELVIS WITH CONTRAST TECHNIQUE: Multidetector CT imaging of the abdomen  and pelvis was performed using the standard protocol following bolus administration of intravenous contrast. CONTRAST:  182mL ISOVUE-300 IOPAMIDOL (ISOVUE-300) INJECTION 61%, 72mL ISOVUE-300 IOPAMIDOL (ISOVUE-300) INJECTION 61% COMPARISON:  02/27/2018 FINDINGS: Despite efforts by the technologist and patient, motion artifact is present on today's exam and could not be eliminated. This reduces exam sensitivity and specificity. Lower chest: Small new bilateral pleural effusions with passive atelectasis. These effusions are nonspecific for transudative or exudative etiology. Borderline wall thickening of the distal thoracic esophagus. Hepatobiliary: Abnormally distended gallbladder, 5.4 cm in diameter. The common bile duct measures 1.2 cm in diameter and previously was only 0.5 cm in diameter. Pancreas: Mild dorsal pancreatic duct dilatation in the pancreatic body and head, increased from prior. Spleen: Unremarkable Adrenals/Urinary Tract: Unremarkable. Foley catheter in the urinary bladder. Stomach/Bowel: No obvious mass in the vicinity of the ampulla The colon is moderately distended with abnormal wall thickening extending from the distal transverse colon through to the sigmoid colon. Mildly accentuated associated enhancement. Air fluid levels in the distal colon compatible with diarrheal process. There is sigmoid colon diverticulosis without appreciable active diverticulitis. Along the anterior wall of the descending colon, there several small patches of relative mucosal hypoenhancement with some exaggerated stranding along the margins, for example on images 94 through 101 of series 6. Small caliber terminal ileum  particularly compared to the prominent colon. Small bowel loops are relatively unremarkable and the rectum appears unremarkable. Vascular/Lymphatic: Aortoiliac atherosclerotic vascular disease. Patent celiac trunk, SMA, and inferior mesenteric artery. No pathologic adenopathy identified. Reproductive: Uterus absent.  Adnexa unremarkable. Other: Scattered moderate ascites. No free intraperitoneal gas is identified. Musculoskeletal: Bilateral flank edema increased from 02/27/2018. There is edema tracking along the upper thigh musculature, right greater than left. IMPRESSION: 1. Abnormal appearance of the colon, once again with distended colon, distal air-fluid levels indicating diarrheal process, and abnormal accentuated enhancement of the colon wall in the descending colon and proximal sigmoid. There are 2 regions of anterior hypoenhancement along the affected colon wall, of uncertain significance. There is sigmoid diverticulosis without active diverticulitis. Overall the appearance is compatible with colitis and could be due to prominent infectious colitis or inflammatory bowel disease such as Crohn's colitis. The central mesenteric vessels are patent making ischemic colitis less likely but not totally excluded. There is new scattered scattered moderate ascites including ascites around the descending colon along with inflammatory stranding. 2. Along with the ascites, there small bilateral pleural effusions and bilateral flank edema tracking into the upper thighs indicating third spacing of fluids. 3. Newly abnormally distended gallbladder, 5.4 cm in diameter, with new dilatation of the common bile duct and of part of the dorsal pancreatic duct. I do not see an obvious lesion in the vicinity of the ampulla. The possibility of occult choledocholithiasis or occult ampullary lesion is raised. If assessment for acute cholecystitis is warranted, hepatobiliary nuclear medicine scan can be used to assess cystic duct  patency. There is no current significant intrahepatic biliary dilatation. 4.  Aortic Atherosclerosis (ICD10-I70.0). Electronically Signed   By: Van Clines M.D.   On: 03/04/2018 17:04   Ct Abdomen Pelvis W Contrast  Result Date: 02/27/2018 CLINICAL DATA:  Generalized abdominal pain with nausea.  Vomiting. EXAM: CT ABDOMEN AND PELVIS WITH CONTRAST TECHNIQUE: Multidetector CT imaging of the abdomen and pelvis was performed using the standard protocol following bolus administration of intravenous contrast. CONTRAST:  41mL ISOVUE-300 IOPAMIDOL (ISOVUE-300) INJECTION 61% COMPARISON:  November 17, 2015 FINDINGS: Lower chest: Mild opacity in the right base dependently is  favored to represent atelectasis. Emphysematous changes in the lung bases. Fluid in the distal esophagus. Coronary artery calcifications. Lung bases otherwise normal. Hepatobiliary: Hepatic steatosis. No other acute liver abnormalities identified. The gallbladder is mildly distended but demonstrates no wall thickening, pericholecystic fluid, or stones. The portal vein is patent. Pancreas: Unremarkable. No pancreatic ductal dilatation or surrounding inflammatory changes. Spleen: Normal in size without focal abnormality. Adrenals/Urinary Tract: The adrenal glands are normal. A few tiny renal cysts are noted. The kidneys are otherwise normal. The ureters and bladder are normal. Stomach/Bowel: The stomach and small bowel are normal. Colonic diverticuli are identified. There is mucosal thickening associated with the colon, most prominent in the descending colon and proximal sigmoid colon. There is also mild wall thickening in the proximal descending colon and proximal sigmoid colon with mild adjacent stranding. There is also stranding in the left pericolic gutter, likely secondary to the colonic process. Proximal to the splenic flexure, the colon is fluid-filled, thin walled, and mildly prominent in caliber measuring up to 6 cm. The appendix is not  visualized. No secondary evidence of appendicitis. The terminal ileum is normal. Vascular/Lymphatic: Atherosclerotic changes are seen in the nonaneurysmal aorta, iliac vessels common femoral vessels. No adenopathy. Reproductive: Status post hysterectomy. No adnexal masses. Other: No abdominal wall hernia or abnormality. No abdominopelvic ascites. Musculoskeletal: There is scalloping of the inferior endplate of L3 which is new since December 2016 but does not appear to be acute. The bones are otherwise normal. IMPRESSION: 1. Mucosal enhancement and mild wall thickening associated with the descending and sigmoid colon is consistent with colitis. This could be infectious, inflammatory, or the ischemic. That being said, the IMA and SMA both enhance normally. Recommend clinical correlation and attention on follow-up. 2. Proximal to the splenic flexure, the colon is borderline in caliber and fluid-filled suggesting ileus or mild relative low-grade obstruction from the more distal colitis. No small bowel dilatation. 3. Mild opacity in the right lung base is favored to represent atelectasis rather than pneumonia. 4. Fluid in the esophagus consistent with reflux. 5. Emphysematous changes in the lung bases. 6. Atherosclerosis in the aorta. 7. Scalloping of the inferior endplate of L3, new since 2016. I suspect this is nonacute based on appearance. Electronically Signed   By: Dorise Bullion III M.D   On: 02/27/2018 15:06   Dg Abd 2 Views  Result Date: 03/07/2018 CLINICAL DATA:  Nasogastric tube placement EXAM: ABDOMEN - 2 VIEW COMPARISON:  None. FINDINGS: Nasogastric tube with the tip projecting over the stomach. There is no bowel dilatation to suggest obstruction. There is no evidence of pneumoperitoneum, portal venous gas or pneumatosis. There are no pathologic calcifications along the expected course of the ureters. Trace bilateral pleural effusions. The osseous structures are unremarkable. IMPRESSION: Nasogastric tube  with the tip projecting over the stomach. Electronically Signed   By: Kathreen Devoid   On: 03/07/2018 10:58   Dg Abd Acute W/chest  Result Date: 03/02/2018 CLINICAL DATA:  Nausea and vomiting EXAM: DG ABDOMEN ACUTE W/ 1V CHEST COMPARISON:  Abdominal and pelvic CT scan of February 27, 2018 and chest x-ray of February 19, 2018. FINDINGS: The lungs are adequately inflated. There is a small amount of pleural fluid blunting the right lateral costophrenic angle. There is no alveolar infiltrate. The heart and pulmonary vascularity are normal. There is calcification in the wall of the aortic arch. The bony thorax exhibits no acute abnormality. Within the abdomen the colonic and rectal stool burden is moderate. There are a  few loops of mildly distended gas-filled small bowel in the mid and lower abdomen. The bony structures are unremarkable. There are no abnormal soft tissue calcification. IMPRESSION: No bowel obstruction nor ileus. Mild gaseous distention of several small bowel loops may reflect a mild ileus. Moderate colonic stool burden but no fecal impaction. Small right pleural effusion new since the previous study. No focal pneumonia underlying COPD. Thoracic aortic atherosclerosis. Electronically Signed   By: David  Martinique M.D.   On: 03/02/2018 09:09   Dg Abd Portable 2v  Result Date: 03/08/2018 CLINICAL DATA:  Abdominal discomfort and distension, nausea, small-bowel obstruction EXAM: PORTABLE ABDOMEN - 2 VIEW COMPARISON:  03/07/2018 FINDINGS: Tip of nasogastric tube projects over gastric antrum. Single air-filled loop of bowel in the mid abdomen, suspect transverse colon. Paucity of remaining bowel gas. No definite dilated small bowel loops are visualized. Lung bases emphysematous but clear. Bones demineralized. IMPRESSION: Non-specific bowel gas pattern. Electronically Signed   By: Lavonia Dana M.D.   On: 03/08/2018 18:54     CBC Recent Labs  Lab 03/06/18 0531 03/07/18 0507 03/08/18 0546 03/09/18 0642  03/10/18 0535 03/12/18 0523  WBC 14.3* 14.1* 17.1* 14.0* 12.2* 9.7  HGB 11.5* 10.7* 10.6* 9.7* 9.7* 8.9*  HCT 37.0 33.5* 33.1* 30.5* 30.9* 28.5*  PLT 320 257 298 329 334 369  MCV 84.5 82.9 84.0 85.0 84.7 85.6  MCH 26.3 26.5 26.9 27.0 26.6 26.7  MCHC 31.1 31.9 32.0 31.8 31.4 31.2  RDW 16.0* 16.3* 16.3* 16.8* 17.3* 18.0*  LYMPHSABS 2.4  --   --  1.4  --  1.2  MONOABS 1.9*  --   --  1.1*  --  1.3*  EOSABS 0.0  --   --  0.0  --  0.0  BASOSABS 0.0  --   --  0.0  --  0.0    Chemistries  Recent Labs  Lab 03/06/18 0531 03/07/18 0507 03/08/18 0546 03/08/18 1334 03/08/18 1338 03/09/18 0552 03/09/18 1644 03/10/18 0535 03/12/18 0523  NA 137 136 136  --   --  137  --  137 137  K 3.5 3.4* 2.6* 3.3*  --  3.2*  --  3.1* 4.1  CL 103 101 96*  --   --  99*  --  100* 104  CO2 26 26 30   --   --  29  --  29 25  GLUCOSE 88 92 93  --   --  113*  --  112* 100*  BUN 6 <5* <5*  --   --  <5*  --  <5* <5*  CREATININE 0.56 0.45 0.48  --   --  0.52  --  0.53 0.59  CALCIUM 7.6* 7.7* 7.7*  --   --  7.6*  --  7.5* 8.2*  MG  --   --   --   --  1.4*  --  1.4* 2.1  --   AST 18  --   --   --   --   --   --   --  14*  ALT 12*  --   --   --   --   --   --   --  10*  ALKPHOS 91  --   --   --   --   --   --   --  60  BILITOT 0.5  --   --   --   --   --   --   --  0.3   ------------------------------------------------------------------------------------------------------------------  No results for input(s): CHOL, HDL, LDLCALC, TRIG, CHOLHDL, LDLDIRECT in the last 72 hours.  No results found for: HGBA1C ------------------------------------------------------------------------------------------------------------------ No results for input(s): TSH, T4TOTAL, T3FREE, THYROIDAB in the last 72 hours.  Invalid input(s): FREET3 ------------------------------------------------------------------------------------------------------------------ No results for input(s): VITAMINB12, FOLATE, FERRITIN, TIBC, IRON, RETICCTPCT  in the last 72 hours.  Coagulation profile No results for input(s): INR, PROTIME in the last 168 hours.  No results for input(s): DDIMER in the last 72 hours.  Cardiac Enzymes No results for input(s): CKMB, TROPONINI, MYOGLOBIN in the last 168 hours.  Invalid input(s): CK ------------------------------------------------------------------------------------------------------------------    Component Value Date/Time   BNP 128.7 (H) 01/28/2018 1422     Roxan Hockey M.D on 03/12/2018 at 7:20 PM  Between 7am to 7pm - Pager - [phone]  After 7pm go to [email or web address] - password [email or web address]  [facility] -  Office  [phone]   Voice Recognition Viviann Spare dictation system was used to create this note, attempts have been made to correct errors. Please contact the author with questions and/or clarifications.

## 2018-03-12 NOTE — Progress Notes (Signed)
Physical Therapy Treatment Patient Details Name: Heather [name] MRN: [id number] DOB: 08-20-1935 Today's Date: 03/12/2018    History of Present Illness 83 year old who was discharged on 02/22/2018 after treatment for acute hypoxic respiratory failure secondary to COPD exacerbation who readmitted on 02/27/2018 with abdominal pain nausea and vomiting and findings suggestive of colitis.  NG tube placed 02/04/18    PT Comments    Assisted OOB to [facility] then amb an increased distance in hallway.  Pt feeling better but still weak and tired.  Slowly increasing her diet but not tolerating much.   Follow Up Recommendations  Supervision for mobility/OOB;SNF     Equipment Recommendations  None recommended by PT    Recommendations for Other Services       Precautions / Restrictions Precautions Precautions: Fall Restrictions Weight Bearing Restrictions: No    Mobility  Bed Mobility   Bed Mobility: Supine to Sit;Sit to Supine     Supine to sit: Supervision;HOB elevated;Min guard Sit to supine: Supervision;HOB elevated;Min guard   General bed mobility comments: increased time and assist lines and tubes plus assist to support B LE up onto bed   Transfers Overall transfer level: Needs assistance Equipment used: Rolling walker (2 wheeled);None Transfers: Sit to/from [employer] to Stand: Supervision;Min guard Stand pivot transfers: Supervision;Min guard       General transfer comment: verbal cues for safe technique, assist to rise and steady   Also assisted on/odff BSC   Ambulation/Gait Ambulation/Gait assistance: Min guard;Supervision Ambulation Distance (Feet): 85 Feet Assistive device: Rolling walker (2 wheeled) Gait Pattern/deviations: Step-through pattern;Decreased stride length;Trunk flexed Gait velocity: decreased    General Gait Details: tolerated an increased distance but still limited by ABD pain.  No c/o nausea today    Stairs             Wheelchair Mobility    Modified Rankin (Stroke Patients Only)       Balance                                            Cognition                                              Exercises      General Comments        Pertinent Vitals/Pain      Home Living                      Prior Function            PT Goals (current goals can now be found in the care plan section) Progress towards PT goals: Progressing toward goals    Frequency    Min 3X/week      PT Plan Current plan remains appropriate    Co-evaluation              AM-PAC PT "6 Clicks" Daily Activity  Outcome Measure  Difficulty turning over in bed (including adjusting bedclothes, sheets and blankets)?: A Little Difficulty moving from lying on back to sitting on the side of the bed? : A Little Difficulty sitting down on and standing up from a chair with arms (e.g., wheelchair, bedside commode, etc,.)?: A Little Help needed moving to and  from a bed to chair (including a wheelchair)?: A Little Help needed walking in hospital room?: A Little Help needed climbing 3-5 steps with a railing? : A Lot 6 Click Score: 17    End of Session Equipment Utilized During Treatment: Gait belt Activity Tolerance: Patient tolerated treatment well Patient left: in bed;with call bell/phone within reach Nurse Communication: Mobility status PT Visit Diagnosis: Difficulty in walking, not elsewhere classified (R26.2);Muscle weakness (generalized) (M62.81);Unsteadiness on feet (R26.81)     Time: 4825-0037 PT Time Calculation (min) (ACUTE ONLY): 30 min  Charges:  $Gait Training: 8-22 mins $Therapeutic Activity: 8-22 mins                    G Codes:      Rica Koyanagi  PTA [facility]  Acute  Rehab Pager      [phone]

## 2018-03-12 NOTE — Progress Notes (Signed)
PHARMACIST - PHYSICIAN COMMUNICATION  DR:   Denton Brick  CONCERNING: IV to Oral Route Change Policy  RECOMMENDATION: This patient is receiving Protonix by the intravenous route.  Based on criteria approved by the Pharmacy and Therapeutics Committee, the intravenous medication(s) is/are being converted to the equivalent oral dose form(s).   DESCRIPTION: These criteria include:  The patient is eating (either orally or via tube) and/or has been taking other orally administered medications for a least 24 hours  The patient has no evidence of active gastrointestinal bleeding or impaired GI absorption (gastrectomy, short bowel, patient on TNA or NPO).  If you have questions about this conversion, please contact the Pharmacy Department  []   [phone] )  [facility] []   [phone] )  [facility] []   [phone] )  [facility] []   [phone] )  [facility] [x]   [phone] )  [place], [place], [facility] 03/12/2018 8:58 AM

## 2018-03-12 NOTE — Progress Notes (Signed)
Initial Nutrition Assessment  DOCUMENTATION CODES:   Not applicable  INTERVENTION:  - Will order Ensure Enlive BID, each supplement provides 350 kcal and 20 grams of protein - Will order daily multivitamin with minerals.  - Continue to encourage PO intakes.   NUTRITION DIAGNOSIS:   Inadequate oral intake related to acute illness, poor appetite as evidenced by per patient/family report, meal completion < 50%.  GOAL:   Patient will meet greater than or equal to 90% of their needs  MONITOR:   PO intake, Supplement acceptance, Weight trends, Labs  REASON FOR ASSESSMENT:   LOS(13 days)  ASSESSMENT:   83 year old who was discharged on 02/22/2018 after treatment for acute hypoxic respiratory failure secondary to COPD exacerbation who readmitted on 02/27/2018 with abdominal pain nausea and vomiting and findings suggestive of colitis.  Patient has a history of chronic constipation typically,. Patient also found to have a mild ileus or low-grade bowel obstruction from distal colitis.  BMI indicates normal weight. Pt previously had NGT in place, removed on 4/2. Diet advanced from NPO to CLD on 4/1, from CLD to [place] on 4/3, and from FLD to Soft today at 9:15 AM. Pt was able to consume 25% of dinner last night (FLD) and 25% of breakfast this AM (Soft). She denies any N/V and abdominal pain has improved but not resolved. Appetite decreased d/t acute illness and some degree of ongoing abdominal pain.   Noted that pt was identified by RD on 02/21/18 to meet criteria for moderate malnutrition in the context of chronic illness based on mild muscle and mild fat depletions and 6% wt loss in 1 month. NFPE from this assessment outlined below. In addition, pt has gained 7 lbs since 3/17. At this time, unable to identify pt as meeting criteria for malnutrition.  Medications reviewed; 2000 units vitamin D/day, 25 mcg oral Synthroid/day, 40 mg oral Protonix/day, 40 mEq oral KCl BID. Labs reviewed; CBGs: 95,  113, and 107 mg/dL this AM, BUN: <5 mg/dL, Ca: 8.2 mg/dL.  IVF: D5 @ 50 mL/hr (204 kcal).       NUTRITION - FOCUSED PHYSICAL EXAM:    Most Recent Value  Orbital Region  No depletion  Upper Arm Region  No depletion  Thoracic and Lumbar Region  No depletion  Buccal Region  No depletion  Temple Region  Mild depletion  Clavicle Bone Region  No depletion  Clavicle and Acromion Bone Region  No depletion  Scapular Bone Region  No depletion  Dorsal Hand  No depletion  Patellar Region  No depletion  Anterior Thigh Region  Mild depletion  Posterior Calf Region  No depletion  Edema (RD Assessment)  None  Hair  Reviewed  Eyes  Reviewed  Mouth  Reviewed  Skin  Reviewed  Nails  Reviewed       Diet Order:  DIET SOFT Room service appropriate? Yes; Fluid consistency: Thin  EDUCATION NEEDS:   No education needs have been identified at this time  Skin:  Skin Assessment: Reviewed RN Assessment  Last BM:  4/4  Height:   Ht Readings from Last 1 Encounters:  02/27/18 4\' 11"  (1.499 m)    Weight:   Wt Readings from Last 1 Encounters:  03/12/18 111 lb 15.9 oz (50.8 kg)    Ideal Body Weight:  42.91 kg  BMI:  Body mass index is 22.62 kg/m.  Estimated Nutritional Needs:   Kcal:  1420-1625 (28-32 kcal/kg)  Protein:  60-70 grams  Fluid:  >/= 1.5 L/day  Jarome Matin, MS, RD, LDN, [facility] Inpatient Clinical Dietitian Pager # [phone] After hours/weekend pager # [phone]

## 2018-03-13 MED ORDER — HYDROXYZINE HCL 25 MG PO TABS: 25.0000 mg | ORAL_TABLET | ORAL | 0 refills | Status: DC | PRN

## 2018-03-13 MED ORDER — METRONIDAZOLE 500 MG PO TABS: 500.0000 mg | ORAL_TABLET | Freq: Three times a day (TID) | ORAL | 0 refills | Status: AC

## 2018-03-13 MED ORDER — POTASSIUM CHLORIDE 20 MEQ PO PACK: 40.0000 meq | PACK | Freq: Every day | ORAL | 0 refills | Status: DC

## 2018-03-13 MED ORDER — SENNOSIDES-DOCUSATE SODIUM 8.6-50 MG PO TABS: 2.0000 | ORAL_TABLET | Freq: Every day | ORAL | 2 refills | Status: DC

## 2018-03-13 MED ORDER — CIPROFLOXACIN HCL 500 MG PO TABS: 500.0000 mg | ORAL_TABLET | Freq: Two times a day (BID) | ORAL | 0 refills | Status: AC

## 2018-03-13 MED ORDER — FOLIC ACID 1 MG PO TABS: 1.0000 mg | ORAL_TABLET | Freq: Every day | ORAL | 1 refills | Status: DC

## 2018-03-13 MED ORDER — TRAMADOL HCL 50 MG PO TABS: 50.0000 mg | ORAL_TABLET | Freq: Four times a day (QID) | ORAL | 0 refills | Status: DC | PRN

## 2018-03-13 MED ORDER — ADULT MULTIVITAMIN W/MINERALS CH: 1.0000 | ORAL_TABLET | Freq: Every day | ORAL | 1 refills | Status: DC

## 2018-03-13 MED ORDER — ONDANSETRON HCL 4 MG PO TABS: 4.0000 mg | ORAL_TABLET | Freq: Three times a day (TID) | ORAL | 0 refills | Status: DC | PRN

## 2018-03-13 NOTE — Discharge Summary (Signed)
Heather [name], is a 83 y.o. female  DOB [birth year]  MRN [id number].  Admission date:  02/27/2018  Admitting Physician  Damita Lack, MD  Discharge Date:  03/13/2018   Primary MD  [name], MD  Recommendations for primary care physician for things to follow:   1)Take medications as prescribed 2)Follow-up with Gastroenterologist Dr. Watt Climes or Dr [name]  in 1 week 3)Repeat CBC and BMP test advised in 1 week   Admission Diagnosis  Dehydration [E86.0] Hypokalemia [E87.6] Colitis [K52.9]  Discharge Diagnosis  Dehydration [E86.0] Hypokalemia [E87.6] Colitis [K52.9]    Principal Problem:   Colitis Active Problems:   Hypothyroidism   Dyslipidemia   Anxiety and depression   Essential hypertension   COPD with emphysema (HCC)   Malnutrition of moderate degree   Hypokalemia   Abdominal pain   SBO (small bowel obstruction) ([place])   Dehydration      Past Medical History:  Diagnosis Date  . ANXIETY 07/23/2009  . Atherosclerosis of aorta ([place]) 07/24/2015  . Complication of anesthesia 7 or 8 yrs ago   woke up during colonscopy  . Coronary atherosclerosis 07/24/2015  . DEPRESSION 07/23/2009  . DIVERTICULOSIS, COLON 07/23/2009  . GERD 07/23/2009  . Headache(784.0) 07/23/2009   occasional  . Hemorrhoids   . HIP PAIN, LEFT 05/16/2010  . History of Crohn's disease   . HYPERLIPIDEMIA 07/23/2009  . HYPERTENSION 07/23/2009  . HYPOTHYROIDISM 07/23/2009  . IBS (irritable bowel syndrome)   . Insomnia   . LOW BACK PAIN, CHRONIC 10/01/2009  . PARESTHESIA 10/01/2009  . Shingles 2006   back    Past Surgical History:  Procedure Laterality Date  . ABDOMINAL HYSTERECTOMY  age 24 or 75  . APPENDECTOMY  yrs ago  . BILATERAL SALPINGOOPHORECTOMY  age 52 or 89  . COLONOSCOPY N/A 11/17/2013   Procedure: COLONOSCOPY;  Surgeon: Cleotis Nipper, MD;  Location: [facility];  Service: Endoscopy;   Laterality: N/A;  . ESOPHAGOGASTRODUODENOSCOPY N/A 11/17/2013   Procedure: ESOPHAGOGASTRODUODENOSCOPY (EGD);  Surgeon: Cleotis Nipper, MD;  Location: [facility];  Service: Endoscopy;  Laterality: N/A;  . HEMORRHOID SURGERY  yrs ago  . TONSILLECTOMY  yrs ago       HPI  from the history and physical done on the day of admission:    Chief Complaint: Abdominal pain  HPI: Heather [name] is a 83 y.o. female with medical history significant of, depression, hypothyroidism, hypertension, insomnia comes to the hospital with complaints of abdominal pain.  Patient was recently admitted here for acute hypoxic respiratory distress secondary to COPD exacerbation and discharged on February 22, 2018.  She was discharged on 14-day steroid taper and bronchodilators.  Patient stated after going home she started developing left-sided abdominal pain which she describes as sharp and crampy along with feeling of nausea without any vomiting.  Due to this she was unable to tolerate any oral intake.  She denies any fevers, chills, diarrhea.  States typically she suffers from constipation and has not had a bowel movement  last 4-5 days. Had his colonoscopy and endoscopy back in 2014, colonoscopy showed diverticulosis and flat polyp in the proximal colon.  Endoscopy was normal  In the ER today she appeared in mild distress due to left-sided abdominal pain.  Her vital signs are unremarkable.  On her labs she was noted to have hypokalemic potassium was 3.1, creatinine 1.16 which is up from baseline of 1.16.  WBC was elevated at 33.0.  CT of the abdomen pelvis was suggestive of distal sigmoid colitis, ileus versus partial small bowel obstruction.  Case was discussed with general surgery by the ER physician who recommended medical management and if necessary requested formal consult.  Patient was admitted to medical reasons     Hospital Course:    Brief Narrative: 83 year old who was discharged on 02/22/2018 after  treatment for acute hypoxic respiratory failure secondary to COPD exacerbation who readmitted on 02/27/2018 with abdominal pain nausea and vomiting and findings suggestive of colitis. Patient has a history of chronic constipation typically,. Patient also found to have a mild ileus or low-grade bowel obstruction from distal colitis.   Assessment & Plan:  1)Diffuse Colitis with SBO : Colitis may be infectious versus ischemic, favor ischemic, overall improved, slowly, tolerating soft diet well, GI physician recommends treating with Cipro and Flagyl for another week outpatient follow-up with GI.  Hemoccult positive stools, she wants to defer flex sigmoidoscopy at this time, will consider doing this as outpatient. HIDA scan not sig for acute cholecystitis, Surgery and GI consults appreciated, She remainsAfebrile .c diff pcr is negative and gi pathogen pcr is negative.    2)AKI: Resolved.   3)Hypertension: Stable, continue Cardizem CD 240 mg daily Well controlled. -   4)FEN-moderate protein caloric malnutrition with Hypoalbuminemia:leading to third spacing with ascites, pleural effusions. ,  Nutritional supplements advised  5)Hypothyroidism:-Continue levothyroxine 25 mcg daily  6) chronic anemia -no evidence of ongoing bleeding , baseline hemoglobin usually around 11 ,  Need further workup with sigmoidoscopy by gastroenterology,. Gi recommended flex sigmoidoscopy, but pt wants to defer it at this time. Fecal occult blood was positive. Anemia panel shows low folate levels, replaced.  7)Disposition-plan of care discussed with patient and son at bedside, patient refuses to go to skilled nursing facility for rehab, she would like to go home instead, patient has generalized weakness and debility, she is starting to eat  better  Code Status:FULL CODE.  Family Communication:discussed with son at bedside.    Consultants:  Surgery  Gastroenterology.    Procedures:nONE.    Antimicrobials: ciprofloxacin and flagyl.    Discharge Condition: stable  Follow UP  Follow-up Information    [facility] Follow up.   Specialty:  Home Health Services Why:  [place] will call to arrange initial appointment Contact information: [address] [place] [address] [phone]             Diet and Activity recommendation:  As advised  Discharge Instructions    Discharge Instructions    Call MD for:  difficulty breathing, headache or visual disturbances   Complete by:  As directed    Call MD for:  persistant dizziness or light-headedness   Complete by:  As directed    Call MD for:  persistant nausea and vomiting   Complete by:  As directed    Call MD for:  severe uncontrolled pain   Complete by:  As directed    Call MD for:  temperature >100.4   Complete by:  As directed    Diet - low sodium heart healthy   Complete by:  As directed    Discharge instructions   Complete by:  As directed    1) take medications as prescribed 2) follow-up with gastroenterologist Dr. Watt Climes or Dr [name]  in 1 week 3) repeat CBC and BMP test advised in 1 week   Increase activity slowly   Complete by:  As directed         Discharge Medications     Allergies as of 03/13/2018      Reactions   Tylenol [acetaminophen] Other (See Comments)   Nightmares   Symbicort [budesonide-formoterol Fumarate] Other (See Comments)   Pt felt like her tongue was swollen, could not swallow   Duloxetine Other (See Comments)   Patient doesn't recall   Metronidazole Other (See Comments)   Patient doesn't recall-->Tolerated IV Flagyl during inpt admission March 2019   Rofecoxib Other (See Comments)   Patient doesn't recall      Medication List    STOP taking these medications   esomeprazole 40 MG capsule Commonly known as:  NEXIUM   predniSONE 10 MG tablet Commonly known as:  DELTASONE    zolpidem 5 MG tablet Commonly known as:  AMBIEN     TAKE these medications   albuterol (2.5 MG/3ML) 0.083% nebulizer solution Commonly known as:  PROVENTIL INHALE CONTENTS OF 1 AMP VIA NEBULIZER EVERY 6 HRS AS NEEDED FOR WHEEZING/ SHORTNESS OF BREATH. What changed:  Another medication with the same name was changed. Make sure you understand how and when to take each.   Albuterol Sulfate 108 (90 Base) MCG/ACT Aepb Commonly known as:  PROAIR RESPICLICK Inhale 2 puffs into the lungs 4 (four) times daily as needed. What changed:  reasons to take this   benzonatate 100 MG capsule Commonly known as:  TESSALON TAKE 1 CAPSULE TWICE DAILY AS NEEDED FOR COUGH.   Cholecalciferol 2000 units Tabs Take 2,000 Units by mouth daily.   ciprofloxacin 500 MG tablet Commonly known as:  CIPRO Take 1 tablet (500 mg total) by mouth 2 (two) times daily for 7 days.   clorazepate 7.5 MG tablet Commonly known as:  TRANXENE TAKE (1) TABLET TWICE DAILY AS NEEDED FOR ANXIETY.   diltiazem 240 MG 24 hr capsule Commonly known as:  CARDIZEM CD TAKE (1) CAPSULE DAILY.   folic acid 1 MG tablet Commonly known as:  FOLVITE Take 1 tablet (1 mg total) by mouth daily. Start taking on:  03/14/2018   hydrOXYzine 25 MG tablet Commonly known as:  ATARAX/VISTARIL Take 1 tablet (25 mg total) by mouth every 4 (four) hours as needed for anxiety.   levothyroxine 25 MCG tablet Commonly known as:  SYNTHROID, LEVOTHROID Take 1 tablet (25 mcg total) by mouth daily before breakfast.   metroNIDAZOLE 500 MG tablet Commonly known as:  FLAGYL Take 1 tablet (500 mg total) by mouth 3 (three) times daily for 7 days.   mometasone-formoterol 200-5 MCG/ACT Aero Commonly known as:  DULERA Inhale 2 puffs into the lungs 2 (two) times daily.   MUCINEX FAST-MAX DM MAX 5-100 MG/5ML Liqd Generic drug:  Dextromethorphan-guaiFENesin Take 15 mLs by mouth every 4 (four) hours as needed (chest congestion).   multivitamin with minerals  Tabs tablet Take 1 tablet by mouth daily. Start taking on:  03/14/2018   ondansetron 4 MG tablet Commonly known as:  ZOFRAN Take 1 tablet (4 mg total) by mouth every 8 (eight) hours as needed for nausea or vomiting.  potassium chloride 20 MEQ packet Commonly known as:  KLOR-CON Take 40 mEq by mouth daily.   senna-docusate 8.6-50 MG tablet Commonly known as:  Senokot-S Take 2 tablets by mouth at bedtime.   sertraline 50 MG tablet Commonly known as:  ZOLOFT Take 1 tablet (50 mg total) by mouth daily.   SYSTANE OP Place 1 drop into both eyes 2 (two) times daily as needed (for dry eyes).   tiotropium 18 MCG inhalation capsule Commonly known as:  SPIRIVA HANDIHALER Place 1 capsule (18 mcg total) into inhaler and inhale daily.   traMADol 50 MG tablet Commonly known as:  ULTRAM Take 1 tablet (50 mg total) by mouth every 6 (six) hours as needed for moderate pain. What changed:  reasons to take this   traZODone 50 MG tablet Commonly known as:  DESYREL TAKE 1/2 TO 1 TABLET AT BEDTIME AS NEEDED FOR REST.       Major procedures and Radiology Reports - PLEASE review detailed and final reports for all details, in brief -    Dg Chest 2 View  Result Date: 02/19/2018 CLINICAL DATA:  Shortness of Breath EXAM: CHEST - 2 VIEW COMPARISON:  None. FINDINGS: The heart size and mediastinal contours are within normal limits. Both lungs are clear. The visualized skeletal structures are unremarkable. IMPRESSION: No active cardiopulmonary disease. Electronically Signed   By: Inez Catalina M.D.   On: 02/19/2018 16:08   Nm Hepatobiliary Liver Func  Result Date: 03/05/2018 CLINICAL DATA:  Abdominal pain with nausea and vomiting. Distended gallbladder. EXAM: NUCLEAR MEDICINE HEPATOBILIARY IMAGING TECHNIQUE: Sequential images of the abdomen were obtained out to 60 minutes following intravenous administration of radiopharmaceutical. RADIOPHARMACEUTICALS:  5.19 mCi Tc-70m  Choletec IV COMPARISON:  CT  abdomen and pelvis March 04, 2018 FINDINGS: Liver uptake of radiotracer is unremarkable. There is visualization of gallbladder and small bowel, indicating patency of the cystic and common bile ducts. The patient was unable to tolerate Ensure consumption to facilitate calculation of the computer generated ejection fraction of radiotracer from the gallbladder. IMPRESSION: Cystic and common bile ducts are patent, evidenced by visualization of gallbladder and small bowel. Gallbladder is noted to be distended on recent CT. Patient unable to tolerate Ensure to facilitate calculation of the computer generated ejection fraction of radiotracer from gallbladder. Electronically Signed   By: Lowella Grip III M.D.   On: 03/05/2018 16:53   Ct Abdomen Pelvis W Contrast  Result Date: 03/04/2018 CLINICAL DATA:  Abdominal pain, prior distal colitis EXAM: CT ABDOMEN AND PELVIS WITH CONTRAST TECHNIQUE: Multidetector CT imaging of the abdomen and pelvis was performed using the standard protocol following bolus administration of intravenous contrast. CONTRAST:  155mL ISOVUE-300 IOPAMIDOL (ISOVUE-300) INJECTION 61%, 12mL ISOVUE-300 IOPAMIDOL (ISOVUE-300) INJECTION 61% COMPARISON:  02/27/2018 FINDINGS: Despite efforts by the technologist and patient, motion artifact is present on today's exam and could not be eliminated. This reduces exam sensitivity and specificity. Lower chest: Small new bilateral pleural effusions with passive atelectasis. These effusions are nonspecific for transudative or exudative etiology. Borderline wall thickening of the distal thoracic esophagus. Hepatobiliary: Abnormally distended gallbladder, 5.4 cm in diameter. The common bile duct measures 1.2 cm in diameter and previously was only 0.5 cm in diameter. Pancreas: Mild dorsal pancreatic duct dilatation in the pancreatic body and head, increased from prior. Spleen: Unremarkable Adrenals/Urinary Tract: Unremarkable. Foley catheter in the urinary bladder.  Stomach/Bowel: No obvious mass in the vicinity of the ampulla The colon is moderately distended with abnormal wall thickening extending from the distal  transverse colon through to the sigmoid colon. Mildly accentuated associated enhancement. Air fluid levels in the distal colon compatible with diarrheal process. There is sigmoid colon diverticulosis without appreciable active diverticulitis. Along the anterior wall of the descending colon, there several small patches of relative mucosal hypoenhancement with some exaggerated stranding along the margins, for example on images 94 through 101 of series 6. Small caliber terminal ileum particularly compared to the prominent colon. Small bowel loops are relatively unremarkable and the rectum appears unremarkable. Vascular/Lymphatic: Aortoiliac atherosclerotic vascular disease. Patent celiac trunk, SMA, and inferior mesenteric artery. No pathologic adenopathy identified. Reproductive: Uterus absent.  Adnexa unremarkable. Other: Scattered moderate ascites. No free intraperitoneal gas is identified. Musculoskeletal: Bilateral flank edema increased from 02/27/2018. There is edema tracking along the upper thigh musculature, right greater than left. IMPRESSION: 1. Abnormal appearance of the colon, once again with distended colon, distal air-fluid levels indicating diarrheal process, and abnormal accentuated enhancement of the colon wall in the descending colon and proximal sigmoid. There are 2 regions of anterior hypoenhancement along the affected colon wall, of uncertain significance. There is sigmoid diverticulosis without active diverticulitis. Overall the appearance is compatible with colitis and could be due to prominent infectious colitis or inflammatory bowel disease such as Crohn's colitis. The central mesenteric vessels are patent making ischemic colitis less likely but not totally excluded. There is new scattered scattered moderate ascites including ascites around the  descending colon along with inflammatory stranding. 2. Along with the ascites, there small bilateral pleural effusions and bilateral flank edema tracking into the upper thighs indicating third spacing of fluids. 3. Newly abnormally distended gallbladder, 5.4 cm in diameter, with new dilatation of the common bile duct and of part of the dorsal pancreatic duct. I do not see an obvious lesion in the vicinity of the ampulla. The possibility of occult choledocholithiasis or occult ampullary lesion is raised. If assessment for acute cholecystitis is warranted, hepatobiliary nuclear medicine scan can be used to assess cystic duct patency. There is no current significant intrahepatic biliary dilatation. 4.  Aortic Atherosclerosis (ICD10-I70.0). Electronically Signed   By: Van Clines M.D.   On: 03/04/2018 17:04   Ct Abdomen Pelvis W Contrast  Result Date: 02/27/2018 CLINICAL DATA:  Generalized abdominal pain with nausea.  Vomiting. EXAM: CT ABDOMEN AND PELVIS WITH CONTRAST TECHNIQUE: Multidetector CT imaging of the abdomen and pelvis was performed using the standard protocol following bolus administration of intravenous contrast. CONTRAST:  38mL ISOVUE-300 IOPAMIDOL (ISOVUE-300) INJECTION 61% COMPARISON:  November 17, 2015 FINDINGS: Lower chest: Mild opacity in the right base dependently is favored to represent atelectasis. Emphysematous changes in the lung bases. Fluid in the distal esophagus. Coronary artery calcifications. Lung bases otherwise normal. Hepatobiliary: Hepatic steatosis. No other acute liver abnormalities identified. The gallbladder is mildly distended but demonstrates no wall thickening, pericholecystic fluid, or stones. The portal vein is patent. Pancreas: Unremarkable. No pancreatic ductal dilatation or surrounding inflammatory changes. Spleen: Normal in size without focal abnormality. Adrenals/Urinary Tract: The adrenal glands are normal. A few tiny renal cysts are noted. The kidneys are  otherwise normal. The ureters and bladder are normal. Stomach/Bowel: The stomach and small bowel are normal. Colonic diverticuli are identified. There is mucosal thickening associated with the colon, most prominent in the descending colon and proximal sigmoid colon. There is also mild wall thickening in the proximal descending colon and proximal sigmoid colon with mild adjacent stranding. There is also stranding in the left pericolic gutter, likely secondary to the colonic process. Proximal to  the splenic flexure, the colon is fluid-filled, thin walled, and mildly prominent in caliber measuring up to 6 cm. The appendix is not visualized. No secondary evidence of appendicitis. The terminal ileum is normal. Vascular/Lymphatic: Atherosclerotic changes are seen in the nonaneurysmal aorta, iliac vessels common femoral vessels. No adenopathy. Reproductive: Status post hysterectomy. No adnexal masses. Other: No abdominal wall hernia or abnormality. No abdominopelvic ascites. Musculoskeletal: There is scalloping of the inferior endplate of L3 which is new since December 2016 but does not appear to be acute. The bones are otherwise normal. IMPRESSION: 1. Mucosal enhancement and mild wall thickening associated with the descending and sigmoid colon is consistent with colitis. This could be infectious, inflammatory, or the ischemic. That being said, the IMA and SMA both enhance normally. Recommend clinical correlation and attention on follow-up. 2. Proximal to the splenic flexure, the colon is borderline in caliber and fluid-filled suggesting ileus or mild relative low-grade obstruction from the more distal colitis. No small bowel dilatation. 3. Mild opacity in the right lung base is favored to represent atelectasis rather than pneumonia. 4. Fluid in the esophagus consistent with reflux. 5. Emphysematous changes in the lung bases. 6. Atherosclerosis in the aorta. 7. Scalloping of the inferior endplate of L3, new since 2016. I  suspect this is nonacute based on appearance. Electronically Signed   By: Dorise Bullion III M.D   On: 02/27/2018 15:06   Dg Abd 2 Views  Result Date: 03/07/2018 CLINICAL DATA:  Nasogastric tube placement EXAM: ABDOMEN - 2 VIEW COMPARISON:  None. FINDINGS: Nasogastric tube with the tip projecting over the stomach. There is no bowel dilatation to suggest obstruction. There is no evidence of pneumoperitoneum, portal venous gas or pneumatosis. There are no pathologic calcifications along the expected course of the ureters. Trace bilateral pleural effusions. The osseous structures are unremarkable. IMPRESSION: Nasogastric tube with the tip projecting over the stomach. Electronically Signed   By: Kathreen Devoid   On: 03/07/2018 10:58   Dg Abd Acute W/chest  Result Date: 03/02/2018 CLINICAL DATA:  Nausea and vomiting EXAM: DG ABDOMEN ACUTE W/ 1V CHEST COMPARISON:  Abdominal and pelvic CT scan of February 27, 2018 and chest x-ray of February 19, 2018. FINDINGS: The lungs are adequately inflated. There is a small amount of pleural fluid blunting the right lateral costophrenic angle. There is no alveolar infiltrate. The heart and pulmonary vascularity are normal. There is calcification in the wall of the aortic arch. The bony thorax exhibits no acute abnormality. Within the abdomen the colonic and rectal stool burden is moderate. There are a few loops of mildly distended gas-filled small bowel in the mid and lower abdomen. The bony structures are unremarkable. There are no abnormal soft tissue calcification. IMPRESSION: No bowel obstruction nor ileus. Mild gaseous distention of several small bowel loops may reflect a mild ileus. Moderate colonic stool burden but no fecal impaction. Small right pleural effusion new since the previous study. No focal pneumonia underlying COPD. Thoracic aortic atherosclerosis. Electronically Signed   By: David  Martinique M.D.   On: 03/02/2018 09:09   Dg Abd Portable 2v  Result Date:  03/08/2018 CLINICAL DATA:  Abdominal discomfort and distension, nausea, small-bowel obstruction EXAM: PORTABLE ABDOMEN - 2 VIEW COMPARISON:  03/07/2018 FINDINGS: Tip of nasogastric tube projects over gastric antrum. Single air-filled loop of bowel in the mid abdomen, suspect transverse colon. Paucity of remaining bowel gas. No definite dilated small bowel loops are visualized. Lung bases emphysematous but clear. Bones demineralized. IMPRESSION: Non-specific bowel gas  pattern. Electronically Signed   By: Lavonia Dana M.D.   On: 03/08/2018 18:54    Micro Results     Recent Results (from the past 240 hour(s))  Gastrointestinal Panel by PCR , Stool     Status: None   Collection Time: 03/05/18  8:18 PM  Result Value Ref Range Status   Campylobacter species NOT DETECTED NOT DETECTED Final   Plesimonas shigelloides NOT DETECTED NOT DETECTED Final   Salmonella species NOT DETECTED NOT DETECTED Final   Yersinia enterocolitica NOT DETECTED NOT DETECTED Final   Vibrio species NOT DETECTED NOT DETECTED Final   Vibrio cholerae NOT DETECTED NOT DETECTED Final   Enteroaggregative E coli (EAEC) NOT DETECTED NOT DETECTED Final   Enteropathogenic E coli (EPEC) NOT DETECTED NOT DETECTED Final   Enterotoxigenic E coli (ETEC) NOT DETECTED NOT DETECTED Final   Shiga like toxin producing E coli (STEC) NOT DETECTED NOT DETECTED Final   Shigella/Enteroinvasive E coli (EIEC) NOT DETECTED NOT DETECTED Final   Cryptosporidium NOT DETECTED NOT DETECTED Final   Cyclospora cayetanensis NOT DETECTED NOT DETECTED Final   Entamoeba histolytica NOT DETECTED NOT DETECTED Final   Giardia lamblia NOT DETECTED NOT DETECTED Final   Adenovirus F40/41 NOT DETECTED NOT DETECTED Final   Astrovirus NOT DETECTED NOT DETECTED Final   Norovirus GI/GII NOT DETECTED NOT DETECTED Final   Rotavirus A NOT DETECTED NOT DETECTED Final   Sapovirus (I, II, IV, and V) NOT DETECTED NOT DETECTED Final    Comment: Performed at [facility], [address] [place], [place] [address]  C difficile quick scan w PCR reflex     Status: None   Collection Time: 03/06/18  7:42 AM  Result Value Ref Range Status   C Diff antigen NEGATIVE NEGATIVE Final   C Diff toxin NEGATIVE NEGATIVE Final   C Diff interpretation No C. difficile detected.  Final    Comment: Performed at [facility], [place] [address] [place], [place] [address]       Today   Subjective    Heather [name] today has no new complaints eating and drinking okay, had BM          Patient has been seen and examined prior to discharge   Objective   Blood pressure (!) 135/58, pulse (!) 108, temperature 98.2 F (36.8 C), temperature source Oral, resp. rate 16, height 4\' 11"  (1.499 m), weight 53.3 kg (117 lb 8.1 oz), SpO2 94 %.   Intake/Output Summary (Last 24 hours) at 03/13/2018 1901 Last data filed at 03/13/2018 1300 Gross per 24 hour  Intake 560 ml  Output 1000 ml  Net -440 ml   Exam Gen:- Awake Alert,  In no apparent distress  HEENT:- [place].AT, No sclera icterus Neck-Supple Neck,No JVD,.  Lungs-  CTAB no wheezing CV- S1, S2 normal Abd-  +ve B.Sounds, Abd Soft, No tenderness,    Extremity/Skin:- No  edema,   good pulses Psych-affect is appropriate, oriented x3 Neuro-no new focal deficits, no tremors     Data Review   CBC w Diff:  Lab Results  Component Value Date   WBC 9.7 03/12/2018   HGB 8.9 (L) 03/12/2018   HCT 28.5 (L) 03/12/2018   PLT 369 03/12/2018   LYMPHOPCT 13 03/12/2018   MONOPCT 13 03/12/2018   EOSPCT 0 03/12/2018   BASOPCT 0 03/12/2018    CMP:  Lab Results  Component Value Date   NA 137 03/12/2018   K 4.1 03/12/2018   CL  104 03/12/2018   CO2 25 03/12/2018   BUN <5 (L) 03/12/2018   CREATININE 0.59 03/12/2018   PROT 4.9 (L) 03/12/2018   ALBUMIN 2.4 (L) 03/12/2018   BILITOT 0.3 03/12/2018   ALKPHOS 60 03/12/2018   AST 14 (L) 03/12/2018   ALT 10 (L) 03/12/2018  .   Total Discharge time is about 33  minutes  Roxan Hockey M.D on 03/13/2018 at 7:01 PM  [facility]   Office  [phone]  Voice Recognition Viviann Spare dictation system was used to create this note, attempts have been made to correct errors. Please contact the author with questions and/or clarifications.

## 2018-03-13 NOTE — Progress Notes (Signed)
DC instructions given and explained in detail to patient and patient's son. Patient and patient's son verbalizes understanding of dc instructions and prescriptions. Escorted to lobby safely in wheel chair via tech

## 2018-03-13 NOTE — Discharge Instructions (Signed)
1) take medications as prescribed 2) follow-up with gastroenterologist Dr. Watt Climes or Dr [name]  in 1 week 3) repeat CBC and BMP test advised in 1 week

## 2018-03-14 ENCOUNTER — Other Ambulatory Visit: Payer: Self-pay | Admitting: Internal Medicine

## 2018-03-14 DIAGNOSIS — K56609 Unspecified intestinal obstruction, unspecified as to partial versus complete obstruction: Secondary | ICD-10-CM | POA: Diagnosis not present

## 2018-03-14 DIAGNOSIS — K529 Noninfective gastroenteritis and colitis, unspecified: Secondary | ICD-10-CM | POA: Diagnosis not present

## 2018-03-14 NOTE — Progress Notes (Signed)
[facility] aware of dc home on yesterday for resumption of care for [facility]. Jonnie Finner RN CCM Case Mgmt phone [phone]

## 2018-03-15 ENCOUNTER — Other Ambulatory Visit: Payer: Self-pay | Admitting: *Deleted

## 2018-03-15 DIAGNOSIS — K56609 Unspecified intestinal obstruction, unspecified as to partial versus complete obstruction: Secondary | ICD-10-CM | POA: Diagnosis not present

## 2018-03-15 DIAGNOSIS — K529 Noninfective gastroenteritis and colitis, unspecified: Secondary | ICD-10-CM | POA: Diagnosis not present

## 2018-03-15 NOTE — Consult Note (Signed)
[facility] Care Management follow up.  Chart reviewed and confirmed with Tommi Rumps with [facility] that Mrs. Avans discharged home with [facility] program over the weekend.  Sent notification to [place] Management office to make aware of above notes.   [name], MSN-Ed, RN,BSN [facility] Liaison [phone]

## 2018-03-16 DIAGNOSIS — K56609 Unspecified intestinal obstruction, unspecified as to partial versus complete obstruction: Secondary | ICD-10-CM | POA: Diagnosis not present

## 2018-03-16 DIAGNOSIS — K529 Noninfective gastroenteritis and colitis, unspecified: Secondary | ICD-10-CM | POA: Diagnosis not present

## 2018-03-17 ENCOUNTER — Telehealth: Payer: Self-pay | Admitting: Internal Medicine

## 2018-03-17 DIAGNOSIS — K529 Noninfective gastroenteritis and colitis, unspecified: Secondary | ICD-10-CM | POA: Diagnosis not present

## 2018-03-17 DIAGNOSIS — K56609 Unspecified intestinal obstruction, unspecified as to partial versus complete obstruction: Secondary | ICD-10-CM | POA: Diagnosis not present

## 2018-03-17 NOTE — Telephone Encounter (Signed)
Copied from [place] [id number]. Topic: Quick Communication - See Telephone Encounter >> Mar 17, 2018 10:36 AM Rutherford Nail, NT wrote: CRM for notification. See Telephone encounter for: 03/17/18. Don an Therapist, music from [facility] calling to get orders for an OT Eval only. Patient declines OT. CB#: [phone]

## 2018-03-17 NOTE — Telephone Encounter (Signed)
Left message to return call 

## 2018-03-19 ENCOUNTER — Encounter: Payer: Self-pay | Admitting: Family Medicine

## 2018-03-19 ENCOUNTER — Ambulatory Visit (INDEPENDENT_AMBULATORY_CARE_PROVIDER_SITE_OTHER): Payer: Medicare Other | Admitting: Family Medicine

## 2018-03-19 ENCOUNTER — Other Ambulatory Visit: Payer: Self-pay | Admitting: Internal Medicine

## 2018-03-19 VITALS — BP 136/58 | HR 114 | Temp 98.4°F | Wt 96.8 lb

## 2018-03-19 DIAGNOSIS — K529 Noninfective gastroenteritis and colitis, unspecified: Secondary | ICD-10-CM

## 2018-03-19 DIAGNOSIS — I2510 Atherosclerotic heart disease of native coronary artery without angina pectoris: Secondary | ICD-10-CM

## 2018-03-19 DIAGNOSIS — E876 Hypokalemia: Secondary | ICD-10-CM | POA: Diagnosis not present

## 2018-03-19 DIAGNOSIS — Z09 Encounter for follow-up examination after completed treatment for conditions other than malignant neoplasm: Secondary | ICD-10-CM

## 2018-03-19 LAB — CBC
HCT: 35.7 % — ABNORMAL LOW (ref 36.0–46.0)
Hemoglobin: 11.7 g/dL — ABNORMAL LOW (ref 12.0–15.0)
MCHC: 32.7 g/dL (ref 30.0–36.0)
MCV: 83.5 fl (ref 78.0–100.0)
Platelets: 521 10*3/uL — ABNORMAL HIGH (ref 150.0–400.0)
RBC: 4.28 Mil/uL (ref 3.87–5.11)
RDW: 20.7 % — ABNORMAL HIGH (ref 11.5–15.5)
WBC: 8.9 10*3/uL (ref 4.0–10.5)

## 2018-03-19 LAB — BASIC METABOLIC PANEL
BUN: 7 mg/dL (ref 6–23)
CO2: 23 mEq/L (ref 19–32)
Calcium: 8.6 mg/dL (ref 8.4–10.5)
Chloride: 101 mEq/L (ref 96–112)
Creatinine, Ser: 0.68 mg/dL (ref 0.40–1.20)
GFR: 88.32 mL/min (ref >60.00)
Glucose, Bld: 92 mg/dL (ref 70–99)
Potassium: 4.1 mEq/L (ref 3.5–5.1)
Sodium: 135 mEq/L (ref 135–145)

## 2018-03-19 MED ORDER — DILTIAZEM HCL ER COATED BEADS 240 MG PO CP24: ORAL_CAPSULE | ORAL | 2 refills | Status: DC

## 2018-03-19 MED ORDER — POTASSIUM CHLORIDE 20 MEQ/15ML (10%) PO SOLN: 40.0000 meq | Freq: Every day | ORAL | 0 refills | Status: DC

## 2018-03-19 MED ORDER — LEVOTHYROXINE SODIUM 25 MCG PO TABS: 25.0000 ug | ORAL_TABLET | Freq: Every day | ORAL | 2 refills | Status: DC

## 2018-03-19 MED ORDER — MOMETASONE FURO-FORMOTEROL FUM 200-5 MCG/ACT IN AERO: 2.0000 | INHALATION_SPRAY | Freq: Two times a day (BID) | RESPIRATORY_TRACT | 3 refills | Status: DC

## 2018-03-19 NOTE — Progress Notes (Signed)
Subjective:    Patient ID: Heather [name], female    DOB: [birth year], 83 y.o.   MRN: [id number]  Chief Complaint  Patient presents with  . Follow-up  Patient accompanied by her son.  HPI Patient was seen today for hospital follow-up.  Patient was hospitalized at the beginning of March for COPD exacerbation, discharged on 02/22/18.  Patient was recently readmitted, 3/23-03/13/18, for dehydration, hypokalemia, colitis with SBO.  Surgery was consulted and felt medical management was appropriate.  Pt had hemoccult positive stools.  HIDA scan obtained and negative.  Pt was sent home on Cipro and Flagyl.  Pt has 1 more dose of these medications left.  At time of discharge pt was advised to follow-up with gastroenterology.  Since discharge patient states she feels terrible, however cannot elaborate.  Patient does endorse continued abdominal pain.  Patient had an episode of emesis last night while trying to take the K dur.  Per patient's son they have not made a follow-up appointment with gastroenterology at this time.  Patient endorses not having much of an appetite she is eating oatmeal and a bran muffin around 11 AM for breakfast.  Pt's son brings her dinner.  Per patient she continues to feel weak but is trying to get up and move around the house more.  Pt continuing to have "dark stools", had a BM this am.  Denies constipation.  Pt son displeased as pt does not have refills on certain medications.  Per review of med list from son several of the medications were not meant to be continued hence they have no refills; such as Tessalon Perles, the antibiotics and tramadol.  Pt's son did not fully understand this.  Son also upset pt has been in and out of the hospital.  Past Medical History:  Diagnosis Date  . ANXIETY 07/23/2009  . Atherosclerosis of aorta ([place]) 07/24/2015  . Complication of anesthesia 7 or 8 yrs ago   woke up during colonscopy  . Coronary atherosclerosis 07/24/2015  . DEPRESSION  07/23/2009  . DIVERTICULOSIS, COLON 07/23/2009  . GERD 07/23/2009  . Headache(784.0) 07/23/2009   occasional  . Hemorrhoids   . HIP PAIN, LEFT 05/16/2010  . History of Crohn's disease   . HYPERLIPIDEMIA 07/23/2009  . HYPERTENSION 07/23/2009  . HYPOTHYROIDISM 07/23/2009  . IBS (irritable bowel syndrome)   . Insomnia   . LOW BACK PAIN, CHRONIC 10/01/2009  . PARESTHESIA 10/01/2009  . Shingles 2006   back    Allergies  Allergen Reactions  . Tylenol [Acetaminophen] Other (See Comments)    Nightmares  . Symbicort [Budesonide-Formoterol Fumarate] Other (See Comments)    Pt felt like her tongue was swollen, could not swallow  . Duloxetine Other (See Comments)    Patient doesn't recall  . Metronidazole Other (See Comments)    Patient doesn't recall-->Tolerated IV Flagyl during inpt admission March 2019   . Rofecoxib Other (See Comments)    Patient doesn't recall     ROS General: Denies fever, chills, night sweats, changes in weight, changes in appetite HEENT: Denies headaches, ear pain, changes in vision, rhinorrhea, sore throat CV: Denies CP, palpitations, SOB, orthopnea Pulm: Denies SOB, cough, wheezing GI: Denies nausea, vomiting, diarrhea, constipation  +abdominal pain. GU: Denies dysuria, hematuria, frequency, vaginal discharge Msk: Denies muscle cramps, joint pains Neuro: Denies weakness, numbness, tingling Skin: Denies rashes, bruising Psych: Denies depression, anxiety, hallucinations     Objective:    Blood pressure (!) 136/58, pulse (!) 114, temperature 98.4 F (36.9 C),  temperature source Oral, weight 96 lb 12.8 oz (43.9 kg), SpO2 93 %.   Gen. Pleasant, well-nourished, in no distress, normal affect   HEENT: [place]/AT, face symmetric, conjunctiva clear, no scleral icterus, PERRLA, EOMI, nares patent without drainage Lungs: no accessory muscle use, CTAB, no wheezes or rales Cardiovascular: RRR, no m/r/g, no peripheral edema Abdomen: BS present, soft, mild  tenderness/ND Neuro:  A&Ox3, CN II-XII intact, gait not assessed as patient sitting in wheelchair Skin:  Warm, no lesions/ rash.  Ecchymosis on bilateral arms 2/2 blood draws   Wt Readings from Last 3 Encounters:  03/19/18 96 lb 12.8 oz (43.9 kg)  03/13/18 117 lb 8.1 oz (53.3 kg)  02/19/18 104 lb (47.2 kg)    Lab Results  Component Value Date   WBC 9.7 03/12/2018   HGB 8.9 (L) 03/12/2018   HCT 28.5 (L) 03/12/2018   PLT 369 03/12/2018   GLUCOSE 100 (H) 03/12/2018   CHOL 234 (H) 01/26/2018   TRIG 112.0 01/26/2018   HDL 81.90 01/26/2018   LDLDIRECT 118.2 03/29/2013   LDLCALC 130 (H) 01/26/2018   ALT 10 (L) 03/12/2018   AST 14 (L) 03/12/2018   NA 137 03/12/2018   K 4.1 03/12/2018   CL 104 03/12/2018   CREATININE 0.59 03/12/2018   BUN <5 (L) 03/12/2018   CO2 25 03/12/2018   TSH 1.11 01/26/2018    Assessment/Plan:  Colitis -Patient encouraged to complete antibiotic course -Patient advised to advance diet slowly. -Patient encouraged to stay hydrated - Plan: CBC (no diff), Ambulatory referral to Gastroenterology -Patient given RTC or ED precautions given dark stools and ongoing abdominal pain.  Hypokalemia -As patient is having difficulty with Klor-Con we will send an Rx for potassium chloride solution. - Plan: Basic metabolic panel, potassium chloride 20 -CBC and BMP ordered MEQ/15ML (10%) [facility] discharge follow-up -Chart reviewed  F/u prn  Grier Mitts, MD

## 2018-03-19 NOTE — Patient Instructions (Signed)
Colitis Colitis is inflammation of the colon. Colitis may last a short time (acute) or it may last a long time (chronic). What are the causes? This condition may be caused by:  Viruses.  Bacteria.  Reactions to medicine.  Certain autoimmune diseases, such as Crohn disease or ulcerative colitis.  What are the signs or symptoms? Symptoms of this condition include:  Diarrhea.  Passing bloody or tarry stool.  Pain.  Fever.  Vomiting.  Tiredness (fatigue).  Weight loss.  Bloating.  Sudden increase in abdominal pain.  Having fewer bowel movements than usual.  How is this diagnosed? This condition is diagnosed with a stool test or a blood test. You may also have other tests, including X-rays, a CT scan, or a colonoscopy. How is this treated? Treatment may include:  Resting the bowel. This involves not eating or drinking for a period of time.  Fluids that are given through an IV tube.  Medicine for pain and diarrhea.  Antibiotic medicines.  Cortisone medicines.  Surgery.  Follow these instructions at home: Eating and drinking  Follow instructions from your health care provider about eating or drinking restrictions.  Drink enough fluid to keep your urine clear or pale yellow.  Work with a dietitian to determine which foods cause your condition to flare up.  Avoid foods that cause flare-ups.  Eat a well-balanced diet. Medicines  Take over-the-counter and prescription medicines only as told by your health care provider.  If you were prescribed an antibiotic medicine, take it as told by your health care provider. Do not stop taking the antibiotic even if you start to feel better. General instructions  Keep all follow-up visits as told by your health care provider. This is important. Contact a health care provider if:  Your symptoms do not go away.  You develop new symptoms. Get help right away if:  You have a fever that does not go away with  treatment.  You develop chills.  You have extreme weakness, fainting, or dehydration.  You have repeated vomiting.  You develop severe pain in your abdomen.  You pass bloody or tarry stool. This information is not intended to replace advice given to you by your health care provider. Make sure you discuss any questions you have with your health care provider. Document Released: 01/01/2005 Document Revised: 05/01/2016 Document Reviewed: 03/19/2015 Elsevier Interactive Patient Education  2018 Elsevier Inc.  

## 2018-03-22 ENCOUNTER — Encounter: Payer: Self-pay | Admitting: Physician Assistant

## 2018-03-22 DIAGNOSIS — K529 Noninfective gastroenteritis and colitis, unspecified: Secondary | ICD-10-CM | POA: Diagnosis not present

## 2018-03-22 DIAGNOSIS — K56609 Unspecified intestinal obstruction, unspecified as to partial versus complete obstruction: Secondary | ICD-10-CM | POA: Diagnosis not present

## 2018-03-22 NOTE — Telephone Encounter (Signed)
Heather [name] was giving verbal orders.

## 2018-03-24 ENCOUNTER — Other Ambulatory Visit: Payer: Self-pay | Admitting: Family Medicine

## 2018-03-24 DIAGNOSIS — K56609 Unspecified intestinal obstruction, unspecified as to partial versus complete obstruction: Secondary | ICD-10-CM | POA: Diagnosis not present

## 2018-03-24 DIAGNOSIS — K529 Noninfective gastroenteritis and colitis, unspecified: Secondary | ICD-10-CM | POA: Diagnosis not present

## 2018-03-24 NOTE — Telephone Encounter (Signed)
Dr Raliegh Ip pt

## 2018-03-25 DIAGNOSIS — K529 Noninfective gastroenteritis and colitis, unspecified: Secondary | ICD-10-CM | POA: Diagnosis not present

## 2018-03-25 DIAGNOSIS — K56609 Unspecified intestinal obstruction, unspecified as to partial versus complete obstruction: Secondary | ICD-10-CM | POA: Diagnosis not present

## 2018-03-25 NOTE — Telephone Encounter (Signed)
Beverly from [facility] calling to check on a med refill for Zolpidem Tartrate 5 MG TAKE 1 TABLET AT BEDTIME AS NEEDED. States that the patient is a Tuesday/Thursday medication delivery system and the patient is currently out. Please advise. CB#: [phone]

## 2018-03-26 ENCOUNTER — Other Ambulatory Visit: Payer: Self-pay | Admitting: Internal Medicine

## 2018-03-29 DIAGNOSIS — K529 Noninfective gastroenteritis and colitis, unspecified: Secondary | ICD-10-CM | POA: Diagnosis not present

## 2018-03-29 DIAGNOSIS — K56609 Unspecified intestinal obstruction, unspecified as to partial versus complete obstruction: Secondary | ICD-10-CM | POA: Diagnosis not present

## 2018-03-31 DIAGNOSIS — K529 Noninfective gastroenteritis and colitis, unspecified: Secondary | ICD-10-CM | POA: Diagnosis not present

## 2018-03-31 DIAGNOSIS — K56609 Unspecified intestinal obstruction, unspecified as to partial versus complete obstruction: Secondary | ICD-10-CM | POA: Diagnosis not present

## 2018-04-01 ENCOUNTER — Other Ambulatory Visit: Payer: Self-pay | Admitting: Family Medicine

## 2018-04-01 ENCOUNTER — Other Ambulatory Visit: Payer: Self-pay | Admitting: Internal Medicine

## 2018-04-01 DIAGNOSIS — K529 Noninfective gastroenteritis and colitis, unspecified: Secondary | ICD-10-CM | POA: Diagnosis not present

## 2018-04-01 DIAGNOSIS — K56609 Unspecified intestinal obstruction, unspecified as to partial versus complete obstruction: Secondary | ICD-10-CM | POA: Diagnosis not present

## 2018-04-01 NOTE — Telephone Encounter (Signed)
Last OV 03/19/2018   Last refilled 02/11/2018 disp 60 with no refills   Sent to PCP

## 2018-04-02 NOTE — Telephone Encounter (Signed)
Rx was phoned in to the pharmacy for refill #60 with 0 refills.

## 2018-04-02 NOTE — Telephone Encounter (Signed)
Okay for refill?  

## 2018-04-05 ENCOUNTER — Other Ambulatory Visit: Payer: Self-pay | Admitting: Internal Medicine

## 2018-04-05 DIAGNOSIS — K529 Noninfective gastroenteritis and colitis, unspecified: Secondary | ICD-10-CM | POA: Diagnosis not present

## 2018-04-05 DIAGNOSIS — K56609 Unspecified intestinal obstruction, unspecified as to partial versus complete obstruction: Secondary | ICD-10-CM | POA: Diagnosis not present

## 2018-04-06 ENCOUNTER — Ambulatory Visit (INDEPENDENT_AMBULATORY_CARE_PROVIDER_SITE_OTHER): Payer: Medicare Other | Admitting: Physician Assistant

## 2018-04-06 ENCOUNTER — Other Ambulatory Visit: Payer: Self-pay | Admitting: Internal Medicine

## 2018-04-06 ENCOUNTER — Other Ambulatory Visit (INDEPENDENT_AMBULATORY_CARE_PROVIDER_SITE_OTHER): Payer: Medicare Other

## 2018-04-06 ENCOUNTER — Encounter: Payer: Self-pay | Admitting: Physician Assistant

## 2018-04-06 VITALS — BP 140/70 | HR 88 | Ht 59.0 in | Wt 93.5 lb

## 2018-04-06 DIAGNOSIS — I2510 Atherosclerotic heart disease of native coronary artery without angina pectoris: Secondary | ICD-10-CM | POA: Diagnosis not present

## 2018-04-06 DIAGNOSIS — R634 Abnormal weight loss: Secondary | ICD-10-CM

## 2018-04-06 DIAGNOSIS — R1084 Generalized abdominal pain: Secondary | ICD-10-CM | POA: Diagnosis not present

## 2018-04-06 DIAGNOSIS — R110 Nausea: Secondary | ICD-10-CM

## 2018-04-06 DIAGNOSIS — R935 Abnormal findings on diagnostic imaging of other abdominal regions, including retroperitoneum: Secondary | ICD-10-CM | POA: Diagnosis not present

## 2018-04-06 LAB — CBC WITH DIFFERENTIAL/PLATELET
Basophils Absolute: 0 10*3/uL (ref 0.0–0.1)
Basophils Relative: 0.5 % (ref 0.0–3.0)
Eosinophils Absolute: 0 10*3/uL (ref 0.0–0.7)
Eosinophils Relative: 0.3 % (ref 0.0–5.0)
HCT: 35.3 % — ABNORMAL LOW (ref 36.0–46.0)
Hemoglobin: 11.4 g/dL — ABNORMAL LOW (ref 12.0–15.0)
Lymphocytes Relative: 30.7 % (ref 12.0–46.0)
Lymphs Abs: 2.7 10*3/uL (ref 0.7–4.0)
MCHC: 32.3 g/dL (ref 30.0–36.0)
MCV: 85.2 fl (ref 78.0–100.0)
Monocytes Absolute: 0.7 10*3/uL (ref 0.1–1.0)
Monocytes Relative: 7.9 % (ref 3.0–12.0)
Neutro Abs: 5.4 10*3/uL (ref 1.4–7.7)
Neutrophils Relative %: 60.6 % (ref 43.0–77.0)
Platelets: 407 10*3/uL — ABNORMAL HIGH (ref 150.0–400.0)
RBC: 4.15 Mil/uL (ref 3.87–5.11)
RDW: 20.8 % — ABNORMAL HIGH (ref 11.5–15.5)
WBC: 8.8 10*3/uL (ref 4.0–10.5)

## 2018-04-06 LAB — COMPREHENSIVE METABOLIC PANEL
ALT: 9 U/L (ref 0–35)
AST: 17 U/L (ref 0–37)
Albumin: 3.5 g/dL (ref 3.5–5.2)
Alkaline Phosphatase: 72 U/L (ref 39–117)
BUN: 6 mg/dL (ref 6–23)
CO2: 26 mEq/L (ref 19–32)
Calcium: 9.2 mg/dL (ref 8.4–10.5)
Chloride: 104 mEq/L (ref 96–112)
Creatinine, Ser: 0.61 mg/dL (ref 0.40–1.20)
GFR: 100.1 mL/min (ref >60.00)
Glucose, Bld: 82 mg/dL (ref 70–99)
Potassium: 3.8 mEq/L (ref 3.5–5.1)
Sodium: 140 mEq/L (ref 135–145)
Total Bilirubin: 0.5 mg/dL (ref 0.2–1.2)
Total Protein: 6.5 g/dL (ref 6.0–8.3)

## 2018-04-06 LAB — LIPASE: Lipase: 14 U/L (ref 11.0–59.0)

## 2018-04-06 LAB — SEDIMENTATION RATE: Sed Rate: 78 mm/hr — ABNORMAL HIGH (ref 0–30)

## 2018-04-06 MED ORDER — HYDROCODONE-ACETAMINOPHEN 5-325 MG PO TABS: ORAL_TABLET | ORAL | 0 refills | Status: DC

## 2018-04-06 NOTE — Progress Notes (Signed)
Subjective:    Patient ID: Heather [name], female    DOB: 1935/09/29, 83 y.o.   MRN: [id number]  HPI Heather [name] is a pleasant 83 year old white female, new to GI today, referred by Dr. Cordelia Pen after recent hospitalization.  Patient has history of coronary artery disease, COPD with no oxygen use, chronic GERD, diverticulosis, she status post hysterectomy and appendectomy.  She also has remote diagnosis of collagenous colitis by prior records. She is known previously to Dr. [name] and had EGD and colonoscopy done in 2014.  His notes state that she had prior history of collagenous colitis.  Colonoscopy showed scattered diverticulosis otherwise negative exam.  Biopsies showed a hyperplastic polyp there was benign colonic mucosa on random biopsies with no evidence of microscopic colitis.  EGD showed some chronic active gastritis small bowel biopsies were benign. She was hospitalized 3/28 through 03/13/2018 after she presented with acute abdominal pain and distention.  She was actually seen in consultation by [facility] during her admission, and it was felt most likely she had had an episode of ischemic colitis.  She had CT of the abdomen and pelvis on 03/04/2018 that showed gallbladder to be distended to 5.4 cm, CBD of 1.2 cm previously 0.5 cm there was colonic wall thickening of the distal transverse colon through the sigmoid and air-fluid levels in this area consistent with diarrhea noted diverticulosis without diverticulitis.  Her mesenteric vessels were patent there was a small amount of ascites. She also had a HIDA scan done, the patient could not tolerate the Ensure, the cystic and CBD duct were patent but no EF done. She had presented with a WBC of 36,000, normal LFTs creatinine 1.16, no lactate done.  She was covered with IV Cipro and Flagyl, noted to be heme positive.  Her symptoms gradually improved as discharged by 03/13/2018 to complete a short course of Cipro and Flagyl. She was seen by primary  care on 03/19/2018 still feeling poorly and then set up for appointment here. She says she still hurting fairly constantly does not understand why she cannot have something for pain.  She had been given a prescription for tramadol which she says was not helpful.  She does have Phenergan and Zofran at home.  She has been using some Phenergan which she does find helpful for nausea.  She says her appetite is poor and she has lost at least 10 pounds over the past month or so.  He has been drinking some Ensure and boost but says otherwise she is only able to eat very small amounts at a time.  If she eats larger amounts she has increase in pain.  She says her whole abdomen hurts and she is never pain-free.  Describes it as a poking jabbing type of pain area and she is having bowel movements daily usually one loose stool but no diarrhea.  She says her blood stools have been very dark appearing no overt blood.  Review of Systems Pertinent positive and negative review of systems were noted in the above HPI section.  All other review of systems was otherwise negative.  Outpatient Encounter Medications as of 04/06/2018  Medication Sig  . albuterol (PROVENTIL) (2.5 MG/3ML) 0.083% nebulizer solution INHALE CONTENTS OF 1 AMP VIA NEBULIZER EVERY 6 HRS AS NEEDED FOR WHEEZING/ SHORTNESS OF BREATH.  . Albuterol Sulfate (PROAIR RESPICLICK) 425 (90 Base) MCG/ACT AEPB Inhale 2 puffs into the lungs 4 (four) times daily as needed. (Patient taking differently: Inhale 2 puffs into the lungs 4 (  four) times daily as needed (sob and wheezing). )  . benzonatate (TESSALON) 100 MG capsule TAKE 1 CAPSULE TWICE DAILY AS NEEDED FOR COUGH.  . Cholecalciferol 2000 UNITS TABS Take 2,000 Units by mouth daily.  . clorazepate (TRANXENE) 7.5 MG tablet TAKE (1) TABLET TWICE DAILY AS NEEDED FOR ANXIETY.  . Dextromethorphan-guaiFENesin ([place] FAST-MAX DM MAX) 5-100 MG/5ML LIQD Take 15 mLs by mouth every 4 (four) hours as needed (chest congestion).   [name] diltiazem (CARDIZEM CD) 240 MG 24 hr capsule TAKE (1) CAPSULE DAILY.  . folic acid (FOLVITE) 1 MG tablet Take 1 tablet (1 mg total) by mouth daily.  [name] levothyroxine (SYNTHROID, LEVOTHROID) 25 MCG tablet Take 1 tablet (25 mcg total) by mouth daily before breakfast.  . mometasone-formoterol (DULERA) 200-5 MCG/ACT AERO Inhale 2 puffs into the lungs 2 (two) times daily.  . Multiple Vitamin (MULTIVITAMIN WITH MINERALS) TABS tablet Take 1 tablet by mouth daily.  . ondansetron (ZOFRAN) 4 MG tablet Take 1 tablet (4 mg total) by mouth every 8 (eight) hours as needed for nausea or vomiting.  Vladimir Faster Glycol-Propyl Glycol (SYSTANE OP) Place 1 drop into both eyes 2 (two) times daily as needed (for dry eyes).   . potassium chloride 20 MEQ/15ML (10%) SOLN Take 30 mLs (40 mEq total) by mouth daily.  . promethazine (PHENERGAN) 25 MG tablet TAKE 1 TABLET EVERY 8 HOURS AS NEEDED FOR NAUSEA.  [name] senna-docusate (SENOKOT-S) 8.6-50 MG tablet Take 2 tablets by mouth at bedtime.  . sertraline (ZOLOFT) 50 MG tablet Take 1 tablet (50 mg total) by mouth daily.  [name] tiotropium (SPIRIVA HANDIHALER) 18 MCG inhalation capsule Place 1 capsule (18 mcg total) into inhaler and inhale daily.  . traMADol (ULTRAM) 50 MG tablet TAKE ONE TABLET EVERY 6 HOURS AS NEEDED FOR MODERATE PAIN.  . traZODone (DESYREL) 50 MG tablet TAKE 1/2 TO 1 TABLET AT BEDTIME AS NEEDED FOR REST.  [name] zolpidem (AMBIEN) 5 MG tablet TAKE 1 TABLET AT BEDTIME AS NEEDED.  [name] HYDROcodone-acetaminophen (NORCO/VICODIN) 5-325 MG tablet Take 1/2 tablet by mouth twice daily as needed for pain.  . [DISCONTINUED] hydrOXYzine (ATARAX/VISTARIL) 25 MG tablet Take 1 tablet (25 mg total) by mouth every 4 (four) hours as needed for anxiety.   No facility-administered encounter medications on file as of 04/06/2018.    Allergies  Allergen Reactions  . Tylenol [Acetaminophen] Other (See Comments)    Nightmares  . Symbicort [Budesonide-Formoterol Fumarate] Other (See Comments)      Pt felt like her tongue was swollen, could not swallow  . Duloxetine Other (See Comments)    Patient doesn't recall  . Metronidazole Other (See Comments)    Patient doesn't recall-->Tolerated IV Flagyl during inpt admission March 2019   . Rofecoxib Other (See Comments)    Patient doesn't recall    Patient Active Problem List   Diagnosis Date Noted  . Dehydration   . Abdominal pain 02/27/2018  . Colitis 02/27/2018  . SBO (small bowel obstruction) ([place]) 02/27/2018  . Hypokalemia 02/19/2018  . Oral thrush 01/29/2018  . Malnutrition of moderate degree 01/29/2018  . COPD with acute exacerbation ([place]) 01/28/2018  . 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 COPD exacerbation ([place]) 05/29/2016  . Hypoxia 05/29/2016  . Coronary atherosclerosis 07/24/2015  . Atherosclerosis of aorta ([place]) 07/24/2015  . Dysphagia 07/24/2015  . Acute respiratory failure with hypoxia ([place]) 07/23/2015  . Bronchospasm with bronchitis, acute 07/23/2015  . HIP PAIN, LEFT 05/16/2010  . LOW BACK PAIN, CHRONIC 10/01/2009  .  PARESTHESIA 10/01/2009  . Hypothyroidism 07/23/2009  . Dyslipidemia 07/23/2009  . Anxiety state 07/23/2009  . Anxiety and depression 07/23/2009  . Essential hypertension 07/23/2009  . GERD 07/23/2009  . DIVERTICULOSIS, COLON 07/23/2009  . HEADACHE 07/23/2009   Social History   Socioeconomic History  . Marital status: Widowed    Spouse name: Not on file  . Number of children: 1  . Years of education: Not on file  . Highest education level: Not on file  Occupational History  . Not on file  Social Needs  . Financial resource strain: Not on file  . Food insecurity:    Worry: Not on file    Inability: Not on file  . Transportation needs:    Medical: Not on file    Non-medical: Not on file  Tobacco Use  . Smoking status: Never Smoker  . Smokeless tobacco: Never Used  . Tobacco comment: smoked rarely in youth  Substance and Sexual Activity  . Alcohol use: No    Alcohol/week:  0.0 oz  . Drug use: No  . Sexual activity: Not on file  Lifestyle  . Physical activity:    Days per week: Not on file    Minutes per session: Not on file  . Stress: Not on file  Relationships  . Social connections:    Talks on phone: Not on file    Gets together: Not on file    Attends religious service: Not on file    Active member of club or organization: Not on file    Attends meetings of clubs or organizations: Not on file    Relationship status: Not on file  . Intimate partner violence:    Fear of current or ex partner: Not on file    Emotionally abused: Not on file    Physically abused: Not on file    Forced sexual activity: Not on file  Other Topics Concern  . Not on file  Social History Narrative   Widowed.  Lives alone in her own home.  Ambulates with a cane/walker when needed.    Heather [name] family history includes COPD in her sister; Cancer in her mother and sister.      Objective:    Vitals:   04/06/18 1042  BP: 140/70  Pulse: 88    Physical Exam; well-developed elderly white female, who comes in a wheelchair accompanied by her son.  Patient generally ambulates with a walker at home.  Blood pressure 140/70 pulse 88, height 4 foot 11, weight 93, BMI 18.8.  HEENT ;nontraumatic normocephalic EOMI PERRLA sclera anicteric, buccal mucosa moist, neck supple no JVD, Cardiovascular regular rate and rhythm with S1-S2 no murmur rub or gallop.  Pulmonary ;somewhat decreased breath sounds bilaterally, Abdomen; somewhat protuberant, and distended bowel sounds are present she has rather generalized abdominal tenderness no guarding or rebound no palpable mass or hepatosplenomegaly, no audible bruit, Recta;l exam brown stool Hemoccult negative no external lesion noted or internal lesion palpated.  Extremitie;s thin frail no clubbing cyanosis or edema skin warm and dry, Neuro; patient has a non-intention tremor, alert and oriented x3 otherwise nonfocal, mood and affect  appropriate       Assessment & Plan:   #97 83 year old white female with 1 month history of ongoing generalized abdominal pain, hospitalized 3/28 through 03/13/2018 with acute abdominal pain and distention, associated with marked leukocytosis very CT imaging showed a distended gallbladder and also showed fairly diffuse colonic wall thickening of the distal transverse colon through the sigmoid ,  as enteric vessels felt to be patent. She was seen by [facility] during her admission, felt most likely to have had an ischemic colitis. He was treated empirically with Cipro and Flagyl and completed a course after discharge. She continues to have ongoing rather generalized abdominal pain nausea, poor appetite and we able to tolerate very small amounts of food at a time.  She has had weight loss of at least 10 pounds.  #2 COPD no oxygen use 3.  Coronary artery disease 4.  GERD 5.  History of diverticulosis 6.  Status post hysterectomy and appendectomy 7.  Distended gallbladder without stones noted on recent imaging  Plan; patient advised to eat at least 5 very small feedings per day, soft blander foods and to continue Ensure or boost at least twice daily Advised her to use Zofran rather than Phenergan.  Zofran 4 mg p.o. twice daily I will give her a limited amount of Vicodin 5/325.  She is advised to take half tablet twice daily.  I discussed with her and her son that we would not give her ongoing narcotics and that narcotics will further slow her bowel and we need to be careful due to her frail status. We will schedule for CT of the abdomen and pelvis with contrast Repeat labs today She may eventually require endoscopic evaluation.  This was discussed with the patient and her son.  She will be established with Dr. Silverio Decamp .  Further plans pending results of repeat imaging and labs.  Heather [name] S Asya Derryberry PA-C 04/06/2018   Cc: [name], MD

## 2018-04-06 NOTE — Patient Instructions (Signed)
If you are age 83 or older, your body mass index should be between 23-30. Your Body mass index is 18.88 kg/m. If this is out of the aforementioned range listed, please consider follow up with your Primary Care Provider.  Your provider has requested that you go to the basement level for lab work before leaving today. Press "B" on the elevator. The lab is located at the first door on the left as you exit the elevator.  We have provided you with a script  To take to the pharmacy for Vicodin.  Take the Zofran 4 mg you have. Take 1 tab every 6 hours as needed for nausea.    You have been scheduled for a CT scan of the abdomen and pelvis at [facility] ([address]---this is in the same building as Press [facility]).   You are scheduled on 04-12-2018 at 3:30 PM. You should arrive at 3:15 PM  to your appointment time for registration. Please follow the written instructions below on the day of your exam:  WARNING: IF YOU ARE ALLERGIC TO IODINE/X-RAY DYE, PLEASE NOTIFY RADIOLOGY IMMEDIATELY AT [phone]! YOU WILL BE GIVEN A 13 HOUR PREMEDICATION PREP.  1) Do not eat  anything after 11:30 am (4 hours prior to your test) 2) You have been given 2 bottles of oral contrast to drink. The solution may taste better if refrigerated, but do NOT add ice or any other liquid to this solution. Shake well before drinking.    Drink 1 bottle of contrast @ 1:30 PM (2 hours prior to your exam)  Drink 1 bottle of contrast @ 2:30 PM (1 hour prior to your exam)  You may take any medications as prescribed with a small amount of water except for the following: Metformin, Glucophage, Glucovance, Avandamet, Riomet, Fortamet, Actoplus Met, Janumet, Glumetza or Metaglip. The above medications must be held the day of the exam AND 48 hours after the exam.  The purpose of you drinking the oral contrast is to aid in the visualization of your intestinal tract. The contrast solution may cause some diarrhea. Before  your exam is started, you will be given a small amount of fluid to drink. Depending on your individual set of symptoms, you may also receive an intravenous injection of x-ray contrast/dye. Plan on being at [facility] for 30 minutes or long, depending on the type of exam you are having performed.  If you have any questions regarding your exam or if you need to reschedule, you may call the CT department at [phone] between the hours of 8:00 am and 5:00 pm, Monday-Friday.  ________________________________________________________________________

## 2018-04-07 DIAGNOSIS — K56609 Unspecified intestinal obstruction, unspecified as to partial versus complete obstruction: Secondary | ICD-10-CM | POA: Diagnosis not present

## 2018-04-07 DIAGNOSIS — K529 Noninfective gastroenteritis and colitis, unspecified: Secondary | ICD-10-CM | POA: Diagnosis not present

## 2018-04-07 NOTE — Telephone Encounter (Signed)
Okay for refill?  

## 2018-04-07 NOTE — Telephone Encounter (Signed)
Do you refill tramadol for patient. I saw a refill 2x from you. Okay to refill?  Please advise

## 2018-04-08 ENCOUNTER — Other Ambulatory Visit: Payer: Self-pay | Admitting: Internal Medicine

## 2018-04-08 ENCOUNTER — Telehealth: Payer: Self-pay | Admitting: Internal Medicine

## 2018-04-08 DIAGNOSIS — K56609 Unspecified intestinal obstruction, unspecified as to partial versus complete obstruction: Secondary | ICD-10-CM | POA: Diagnosis not present

## 2018-04-08 DIAGNOSIS — K529 Noninfective gastroenteritis and colitis, unspecified: Secondary | ICD-10-CM | POA: Diagnosis not present

## 2018-04-08 MED ORDER — TRAMADOL HCL 50 MG PO TABS: ORAL_TABLET | ORAL | 0 refills | Status: DC

## 2018-04-08 NOTE — Telephone Encounter (Unsigned)
Copied from [place] [id number]. Topic: Quick Communication - See Telephone Encounter >> Apr 08, 2018  5:02 PM Percell Belt A wrote: CRM for notification. See Telephone encounter for: 04/08/18. Free from [facility] Wanted to extend PT for  2 week 1

## 2018-04-08 NOTE — Telephone Encounter (Signed)
What quantity? Every quantity from previous Rx for patient on file are different

## 2018-04-08 NOTE — Telephone Encounter (Signed)
Okay for orders? Please advise 

## 2018-04-08 NOTE — Telephone Encounter (Signed)
Okay for refill #30 

## 2018-04-09 ENCOUNTER — Other Ambulatory Visit: Payer: Self-pay | Admitting: Internal Medicine

## 2018-04-09 NOTE — Progress Notes (Signed)
Reviewed and agree with documentation and assessment and plan. K. Veena Nandigam , MD   

## 2018-04-12 ENCOUNTER — Ambulatory Visit (INDEPENDENT_AMBULATORY_CARE_PROVIDER_SITE_OTHER)
Admission: RE | Admit: 2018-04-12 | Discharge: 2018-04-12 | Disposition: A | Discharge status: Home / Self Care | Payer: Medicare Other | Source: Ambulatory Visit | Attending: Physician Assistant | Admitting: Physician Assistant

## 2018-04-12 ENCOUNTER — Other Ambulatory Visit: Payer: Self-pay | Admitting: Internal Medicine

## 2018-04-12 DIAGNOSIS — R1084 Generalized abdominal pain: Secondary | ICD-10-CM | POA: Diagnosis not present

## 2018-04-12 DIAGNOSIS — R110 Nausea: Secondary | ICD-10-CM

## 2018-04-12 DIAGNOSIS — R634 Abnormal weight loss: Secondary | ICD-10-CM

## 2018-04-12 DIAGNOSIS — R109 Unspecified abdominal pain: Secondary | ICD-10-CM | POA: Diagnosis not present

## 2018-04-12 MED ORDER — IOPAMIDOL (ISOVUE-300) INJECTION 61%
100.0000 mL | Freq: Once | INTRAVENOUS | Status: AC | PRN
  Administered 2018-04-12: 100 mL via INTRAVENOUS

## 2018-04-12 NOTE — Telephone Encounter (Signed)
Gave Free verbal orders per Dr.Kwiatkowski to extend PT.

## 2018-04-12 NOTE — Telephone Encounter (Signed)
Okay to extend PT

## 2018-04-13 DIAGNOSIS — K56609 Unspecified intestinal obstruction, unspecified as to partial versus complete obstruction: Secondary | ICD-10-CM | POA: Diagnosis not present

## 2018-04-13 DIAGNOSIS — K529 Noninfective gastroenteritis and colitis, unspecified: Secondary | ICD-10-CM | POA: Diagnosis not present

## 2018-04-14 DIAGNOSIS — K529 Noninfective gastroenteritis and colitis, unspecified: Secondary | ICD-10-CM | POA: Diagnosis not present

## 2018-04-14 DIAGNOSIS — K56609 Unspecified intestinal obstruction, unspecified as to partial versus complete obstruction: Secondary | ICD-10-CM | POA: Diagnosis not present

## 2018-04-16 ENCOUNTER — Other Ambulatory Visit: Payer: Self-pay | Admitting: Internal Medicine

## 2018-04-19 DIAGNOSIS — K56609 Unspecified intestinal obstruction, unspecified as to partial versus complete obstruction: Secondary | ICD-10-CM | POA: Diagnosis not present

## 2018-04-19 DIAGNOSIS — K529 Noninfective gastroenteritis and colitis, unspecified: Secondary | ICD-10-CM | POA: Diagnosis not present

## 2018-04-21 NOTE — Telephone Encounter (Signed)
Okay to refill? 

## 2018-04-21 NOTE — Telephone Encounter (Signed)
Okay #20 with no refill

## 2018-04-22 ENCOUNTER — Telehealth: Payer: Self-pay | Admitting: Physician Assistant

## 2018-04-22 NOTE — Telephone Encounter (Signed)
Reviewed the CT report again. She is not ready to make an appointment.

## 2018-04-27 DIAGNOSIS — K56609 Unspecified intestinal obstruction, unspecified as to partial versus complete obstruction: Secondary | ICD-10-CM | POA: Diagnosis not present

## 2018-04-27 DIAGNOSIS — K529 Noninfective gastroenteritis and colitis, unspecified: Secondary | ICD-10-CM | POA: Diagnosis not present

## 2018-04-29 ENCOUNTER — Other Ambulatory Visit: Payer: Self-pay | Admitting: Internal Medicine

## 2018-04-30 ENCOUNTER — Encounter: Payer: Self-pay | Admitting: Family

## 2018-04-30 ENCOUNTER — Ambulatory Visit (INDEPENDENT_AMBULATORY_CARE_PROVIDER_SITE_OTHER): Payer: Medicare Other | Admitting: Family

## 2018-04-30 ENCOUNTER — Other Ambulatory Visit: Payer: Self-pay

## 2018-04-30 VITALS — BP 110/60 | HR 80 | Temp 98.3°F | Ht 59.0 in | Wt 95.0 lb

## 2018-04-30 DIAGNOSIS — J439 Emphysema, unspecified: Secondary | ICD-10-CM

## 2018-04-30 DIAGNOSIS — F419 Anxiety disorder, unspecified: Secondary | ICD-10-CM

## 2018-04-30 DIAGNOSIS — K219 Gastro-esophageal reflux disease without esophagitis: Secondary | ICD-10-CM

## 2018-04-30 DIAGNOSIS — E876 Hypokalemia: Secondary | ICD-10-CM | POA: Diagnosis not present

## 2018-04-30 DIAGNOSIS — F329 Major depressive disorder, single episode, unspecified: Secondary | ICD-10-CM | POA: Diagnosis not present

## 2018-04-30 DIAGNOSIS — E039 Hypothyroidism, unspecified: Secondary | ICD-10-CM

## 2018-04-30 DIAGNOSIS — I10 Essential (primary) hypertension: Secondary | ICD-10-CM

## 2018-04-30 MED ORDER — PROMETHAZINE HCL 25 MG PO TABS: 25.0000 mg | ORAL_TABLET | Freq: Three times a day (TID) | ORAL | 0 refills | Status: DC | PRN

## 2018-04-30 MED ORDER — TIOTROPIUM BROMIDE MONOHYDRATE 18 MCG IN CAPS: 1.0000 | ORAL_CAPSULE | Freq: Every day | RESPIRATORY_TRACT | 3 refills | Status: DC

## 2018-04-30 MED ORDER — CLORAZEPATE DIPOTASSIUM 7.5 MG PO TABS: ORAL_TABLET | ORAL | 0 refills | Status: DC

## 2018-04-30 MED ORDER — TRAMADOL HCL 50 MG PO TABS: ORAL_TABLET | ORAL | 1 refills | Status: DC

## 2018-04-30 MED ORDER — MOMETASONE FURO-FORMOTEROL FUM 200-5 MCG/ACT IN AERO: 2.0000 | INHALATION_SPRAY | Freq: Two times a day (BID) | RESPIRATORY_TRACT | 3 refills | Status: DC

## 2018-04-30 MED ORDER — FOLIC ACID 1 MG PO TABS: 1.0000 mg | ORAL_TABLET | Freq: Every day | ORAL | 1 refills | Status: DC

## 2018-04-30 NOTE — Progress Notes (Signed)
Heather [name] is a 83 y.o. female with the following history as recorded in EpicCare:  Patient Active Problem List   Diagnosis Date Noted  . Dehydration   . Abdominal pain 02/27/2018  . Colitis 02/27/2018  . SBO (small bowel obstruction) ([place]) 02/27/2018  . Hypokalemia 02/19/2018  . Oral thrush 01/29/2018  . Malnutrition of moderate degree 01/29/2018  . COPD with acute exacerbation ([place]) 01/28/2018  . 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 COPD exacerbation ([place]) 05/29/2016  . Hypoxia 05/29/2016  . Coronary atherosclerosis 07/24/2015  . Atherosclerosis of aorta ([place]) 07/24/2015  . Dysphagia 07/24/2015  . Acute respiratory failure with hypoxia ([place]) 07/23/2015  . Bronchospasm with bronchitis, acute 07/23/2015  . HIP PAIN, LEFT 05/16/2010  . LOW BACK PAIN, CHRONIC 10/01/2009  . PARESTHESIA 10/01/2009  . Hypothyroidism 07/23/2009  . Dyslipidemia 07/23/2009  . Anxiety state 07/23/2009  . Anxiety and depression 07/23/2009  . Essential hypertension 07/23/2009  . GERD 07/23/2009  . DIVERTICULOSIS, COLON 07/23/2009  . HEADACHE 07/23/2009    Current Outpatient Medications  Medication Sig Dispense Refill  . albuterol (PROVENTIL) (2.5 MG/3ML) 0.083% nebulizer solution INHALE CONTENTS OF 1 AMP VIA NEBULIZER EVERY 6 HRS AS NEEDED FOR WHEEZING/ SHORTNESS OF BREATH. 225 mL 0  . Albuterol Sulfate (PROAIR RESPICLICK) 510 (90 Base) MCG/ACT AEPB Inhale 2 puffs into the lungs 4 (four) times daily as needed. (Patient taking differently: Inhale 2 puffs into the lungs 4 (four) times daily as needed (sob and wheezing). ) 1 each 6  . benzonatate (TESSALON) 100 MG capsule TAKE 1 CAPSULE TWICE DAILY AS NEEDED FOR COUGH. 20 capsule 0  . Cholecalciferol 2000 UNITS TABS Take 2,000 Units by mouth daily.    . clorazepate (TRANXENE) 7.5 MG tablet TAKE (1) TABLET TWICE DAILY AS NEEDED FOR ANXIETY. 60 tablet 0  . Dextromethorphan-guaiFENesin ([place] FAST-MAX DM MAX) 5-100 MG/5ML LIQD Take 15 mLs by  mouth every 4 (four) hours as needed (chest congestion).    [name] diltiazem (CARDIZEM CD) 240 MG 24 hr capsule TAKE (1) CAPSULE DAILY. 90 capsule 2  . folic acid (FOLVITE) 1 MG tablet Take 1 tablet (1 mg total) by mouth daily. 90 tablet 1  . HYDROcodone-acetaminophen (NORCO/VICODIN) 5-325 MG tablet Take 1/2 tablet by mouth twice daily as needed for pain. 30 tablet 0  . levothyroxine (SYNTHROID, LEVOTHROID) 25 MCG tablet Take 1 tablet (25 mcg total) by mouth daily before breakfast. 90 tablet 2  . mometasone-formoterol (DULERA) 200-5 MCG/ACT AERO Inhale 2 puffs into the lungs 2 (two) times daily. 1 Inhaler 3  . Multiple Vitamin (MULTIVITAMIN WITH MINERALS) TABS tablet Take 1 tablet by mouth daily. 30 tablet 1  . NEXIUM 40 MG capsule     . ondansetron (ZOFRAN) 4 MG tablet Take 1 tablet (4 mg total) by mouth every 8 (eight) hours as needed for nausea or vomiting. 20 tablet 0  . Polyethyl Glycol-Propyl Glycol (SYSTANE OP) Place 1 drop into both eyes 2 (two) times daily as needed (for dry eyes).     . potassium chloride 20 MEQ/15ML (10%) SOLN Take 30 mLs (40 mEq total) by mouth daily. 3800 mL 0  . promethazine (PHENERGAN) 25 MG tablet Take 1 tablet (25 mg total) by mouth every 8 (eight) hours as needed for nausea or vomiting. 20 tablet 0  . senna-docusate (SENOKOT-S) 8.6-50 MG tablet Take 2 tablets by mouth at bedtime. 60 tablet 2  . sertraline (ZOLOFT) 50 MG tablet Take 1 tablet (50 mg total) by mouth daily. 90 tablet  3  . tiotropium (SPIRIVA HANDIHALER) 18 MCG inhalation capsule Place 1 capsule (18 mcg total) into inhaler and inhale daily. 90 capsule 3  . traMADol (ULTRAM) 50 MG tablet TAKE ONE TABLET EVERY 6 HOURS AS NEEDED FOR MODERATE PAIN. 30 tablet 1  . traZODone (DESYREL) 50 MG tablet TAKE 1/2 TO 1 TABLET AT BEDTIME AS NEEDED FOR REST. 90 tablet 0  . zolpidem (AMBIEN) 5 MG tablet TAKE 1 TABLET AT BEDTIME AS NEEDED. 30 tablet 0   No current facility-administered medications for this visit.      Allergies: Tylenol [acetaminophen]; Symbicort [budesonide-formoterol fumarate]; Duloxetine; Metronidazole; and Rofecoxib  Past Medical History:  Diagnosis Date  . ANXIETY 07/23/2009  . Atherosclerosis of aorta ([place]) 07/24/2015  . Complication of anesthesia 7 or 8 yrs ago   woke up during colonscopy  . Coronary atherosclerosis 07/24/2015  . DEPRESSION 07/23/2009  . DIVERTICULOSIS, COLON 07/23/2009  . GERD 07/23/2009  . Headache(784.0) 07/23/2009   occasional  . Hemorrhoids   . HIP PAIN, LEFT 05/16/2010  . History of Crohn's disease   . HYPERLIPIDEMIA 07/23/2009  . HYPERTENSION 07/23/2009  . HYPOTHYROIDISM 07/23/2009  . IBS (irritable bowel syndrome)   . Insomnia   . LOW BACK PAIN, CHRONIC 10/01/2009  . PARESTHESIA 10/01/2009  . Shingles 2006   back    Past Surgical History:  Procedure Laterality Date  . ABDOMINAL HYSTERECTOMY  age 6 or 71  . APPENDECTOMY  yrs ago  . BILATERAL SALPINGOOPHORECTOMY  age 17 or 3  . COLONOSCOPY N/A 11/17/2013   Procedure: COLONOSCOPY;  Surgeon: Cleotis Nipper, MD;  Location: [facility];  Service: Endoscopy;  Laterality: N/A;  . ESOPHAGOGASTRODUODENOSCOPY N/A 11/17/2013   Procedure: ESOPHAGOGASTRODUODENOSCOPY (EGD);  Surgeon: Cleotis Nipper, MD;  Location: [facility];  Service: Endoscopy;  Laterality: N/A;  . HEMORRHOID SURGERY  yrs ago  . TONSILLECTOMY  yrs ago    Family History  Problem Relation Age of Onset  . Cancer Mother        Unknown type  . COPD Sister   . Cancer Sister        Breast  . Clotting disorder Neg Hx     Social History   Tobacco Use  . Smoking status: Never Smoker  . Smokeless tobacco: Never Used  . Tobacco comment: smoked rarely in youth  Substance Use Topics  . Alcohol use: No    Alcohol/week: 0.0 oz    Subjective:  Patient presents to transfer her care from another provider in the [facility] system; has been very happy with her care at that office- our office is closer to her home and it will be easier to get to  appointments here; accompanied by her son; in baseline state of health with no concerns. Chronic care needs include: 1) HTN; 2) COPD- stable now; 2 hospitalizations since February; 3) Hypothyroidism- TSH last checked earlier this year; 4) Anxiety/ Depression- stable; 5) Insomnia- stable; 6) GERD- stable on Nexium; 7) Hypokalemia- tolerating oral potassium supplement; Has lost weight in the past few months- working with GI and will be scheduling follow-up there; pleased to see she has gained some weight today; appetite is good; drinks Boost daily;   Objective:  Vitals:   04/30/18 1114  BP: 110/60  Pulse: 80  Temp: 98.3 F (36.8 C)  TempSrc: Oral  SpO2: 97%  Weight: 95 lb (43.1 kg)  Height: 4\' 11"  (1.499 m)    General: Well developed, well nourished, in no acute distress  Skin : Warm and  dry.  Head: Normocephalic and atraumatic  Lungs: Respirations unlabored; clear to auscultation bilaterally without wheeze, rales, rhonchi  CVS exam: normal rate and regular rhythm.  Abdomen: ? Left inguinal lymph node Musculoskeletal: No deformities; no active joint inflammation  Extremities: No edema, cyanosis, clubbing  Vessels: Symmetric bilaterally  Neurologic: Alert and oriented; speech intact; face symmetrical; moves all extremities well; CNII-XII intact without focal deficit  Assessment:  1. Essential hypertension   2. Hypokalemia   3. Gastroesophageal reflux disease, esophagitis presence not specified   4. Hypothyroidism, unspecified type   5. Anxiety and depression   6. Pulmonary emphysema, unspecified emphysema type ([place])     Plan:  Conditions are stable; Extensive medication review done with patient today; patient leaves with corrected/ up to date medication list; she will see GI as previously recommended; follow-up if lymph node in left groin does not resolve in the next 1-2 weeks;  Spent 30 minutes with patient; greater than 50% spent in counseling;    Return for AWV with Sharee Pimple on  8/27 at 2:00 pm.  No orders of the defined types were placed in this encounter.   Requested Prescriptions   Signed Prescriptions Disp Refills  . clorazepate (TRANXENE) 7.5 MG tablet 60 tablet 0    Sig: TAKE (1) TABLET TWICE DAILY AS NEEDED FOR ANXIETY.  . folic acid (FOLVITE) 1 MG tablet 90 tablet 1    Sig: Take 1 tablet (1 mg total) by mouth daily.  . mometasone-formoterol (DULERA) 200-5 MCG/ACT AERO 1 Inhaler 3    Sig: Inhale 2 puffs into the lungs 2 (two) times daily.  . promethazine (PHENERGAN) 25 MG tablet 20 tablet 0    Sig: Take 1 tablet (25 mg total) by mouth every 8 (eight) hours as needed for nausea or vomiting.  . tiotropium (SPIRIVA HANDIHALER) 18 MCG inhalation capsule 90 capsule 3    Sig: Place 1 capsule (18 mcg total) into inhaler and inhale daily.  . traMADol (ULTRAM) 50 MG tablet 30 tablet 1    Sig: TAKE ONE TABLET EVERY 6 HOURS AS NEEDED FOR MODERATE PAIN.

## 2018-05-05 DIAGNOSIS — K529 Noninfective gastroenteritis and colitis, unspecified: Secondary | ICD-10-CM | POA: Diagnosis not present

## 2018-05-05 DIAGNOSIS — K56609 Unspecified intestinal obstruction, unspecified as to partial versus complete obstruction: Secondary | ICD-10-CM | POA: Diagnosis not present

## 2018-05-31 ENCOUNTER — Ambulatory Visit (INDEPENDENT_AMBULATORY_CARE_PROVIDER_SITE_OTHER): Payer: Medicare Other | Admitting: Family

## 2018-05-31 ENCOUNTER — Other Ambulatory Visit (INDEPENDENT_AMBULATORY_CARE_PROVIDER_SITE_OTHER): Payer: Medicare Other

## 2018-05-31 ENCOUNTER — Encounter: Payer: Self-pay | Admitting: Family

## 2018-05-31 ENCOUNTER — Ambulatory Visit (INDEPENDENT_AMBULATORY_CARE_PROVIDER_SITE_OTHER)
Admission: RE | Admit: 2018-05-31 | Discharge: 2018-05-31 | Disposition: A | Discharge status: Home / Self Care | Payer: Medicare Other | Source: Ambulatory Visit | Attending: Family | Admitting: Family

## 2018-05-31 VITALS — BP 112/58 | HR 77 | Temp 98.4°F | Ht 59.0 in | Wt 97.0 lb

## 2018-05-31 DIAGNOSIS — J441 Chronic obstructive pulmonary disease with (acute) exacerbation: Secondary | ICD-10-CM

## 2018-05-31 DIAGNOSIS — J811 Chronic pulmonary edema: Secondary | ICD-10-CM | POA: Diagnosis not present

## 2018-05-31 DIAGNOSIS — R42 Dizziness and giddiness: Secondary | ICD-10-CM | POA: Diagnosis not present

## 2018-05-31 DIAGNOSIS — R600 Localized edema: Secondary | ICD-10-CM

## 2018-05-31 DIAGNOSIS — R200 Anesthesia of skin: Secondary | ICD-10-CM | POA: Diagnosis not present

## 2018-05-31 LAB — COMPREHENSIVE METABOLIC PANEL
ALT: 11 U/L (ref 0–35)
AST: 15 U/L (ref 0–37)
Albumin: 4.6 g/dL (ref 3.5–5.2)
Alkaline Phosphatase: 87 U/L (ref 39–117)
BUN: 11 mg/dL (ref 6–23)
CO2: 26 mEq/L (ref 19–32)
Calcium: 10.1 mg/dL (ref 8.4–10.5)
Chloride: 102 mEq/L (ref 96–112)
Creatinine, Ser: 0.72 mg/dL (ref 0.40–1.20)
GFR: 82.64 mL/min (ref >60.00)
Glucose, Bld: 97 mg/dL (ref 70–99)
Potassium: 3.9 mEq/L (ref 3.5–5.1)
Sodium: 138 mEq/L (ref 135–145)
Total Bilirubin: 0.4 mg/dL (ref 0.2–1.2)
Total Protein: 7.7 g/dL (ref 6.0–8.3)

## 2018-05-31 LAB — CBC WITH DIFFERENTIAL/PLATELET
Basophils Absolute: 0.1 10*3/uL (ref 0.0–0.1)
Basophils Relative: 1.1 % (ref 0.0–3.0)
Eosinophils Absolute: 0.1 10*3/uL (ref 0.0–0.7)
Eosinophils Relative: 0.6 % (ref 0.0–5.0)
HCT: 35.4 % — ABNORMAL LOW (ref 36.0–46.0)
Hemoglobin: 11.7 g/dL — ABNORMAL LOW (ref 12.0–15.0)
Lymphocytes Relative: 27 % (ref 12.0–46.0)
Lymphs Abs: 2.9 10*3/uL (ref 0.7–4.0)
MCHC: 33.1 g/dL (ref 30.0–36.0)
MCV: 86.8 fl (ref 78.0–100.0)
Monocytes Absolute: 0.7 10*3/uL (ref 0.1–1.0)
Monocytes Relative: 6.7 % (ref 3.0–12.0)
Neutro Abs: 7 10*3/uL (ref 1.4–7.7)
Neutrophils Relative %: 64.6 % (ref 43.0–77.0)
Platelets: 439 10*3/uL — ABNORMAL HIGH (ref 150.0–400.0)
RBC: 4.08 Mil/uL (ref 3.87–5.11)
RDW: 16.9 % — ABNORMAL HIGH (ref 11.5–15.5)
WBC: 10.8 10*3/uL — ABNORMAL HIGH (ref 4.0–10.5)

## 2018-05-31 LAB — BRAIN NATRIURETIC PEPTIDE: Pro B Natriuretic peptide (BNP): 68 pg/mL (ref 0.0–100.0)

## 2018-05-31 NOTE — Progress Notes (Signed)
Heather [name] is a 83 y.o. female with the following history as recorded in EpicCare:  Patient Active Problem List   Diagnosis Date Noted  . Dehydration   . Abdominal pain 02/27/2018  . Colitis 02/27/2018  . SBO (small bowel obstruction) ([place]) 02/27/2018  . Hypokalemia 02/19/2018  . Oral thrush 01/29/2018  . Malnutrition of moderate degree 01/29/2018  . COPD with acute exacerbation ([place]) 01/28/2018  . 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 COPD exacerbation ([place]) 05/29/2016  . Hypoxia 05/29/2016  . Coronary atherosclerosis 07/24/2015  . Atherosclerosis of aorta ([place]) 07/24/2015  . Dysphagia 07/24/2015  . Acute respiratory failure with hypoxia ([place]) 07/23/2015  . Bronchospasm with bronchitis, acute 07/23/2015  . HIP PAIN, LEFT 05/16/2010  . LOW BACK PAIN, CHRONIC 10/01/2009  . PARESTHESIA 10/01/2009  . Hypothyroidism 07/23/2009  . Dyslipidemia 07/23/2009  . Anxiety state 07/23/2009  . Anxiety and depression 07/23/2009  . Essential hypertension 07/23/2009  . GERD 07/23/2009  . DIVERTICULOSIS, COLON 07/23/2009  . HEADACHE 07/23/2009    Current Outpatient Medications  Medication Sig Dispense Refill  . albuterol (PROVENTIL) (2.5 MG/3ML) 0.083% nebulizer solution INHALE CONTENTS OF 1 AMP VIA NEBULIZER EVERY 6 HRS AS NEEDED FOR WHEEZING/ SHORTNESS OF BREATH. 225 mL 0  . Albuterol Sulfate (PROAIR RESPICLICK) 510 (90 Base) MCG/ACT AEPB Inhale 2 puffs into the lungs 4 (four) times daily as needed. (Patient taking differently: Inhale 2 puffs into the lungs 4 (four) times daily as needed (sob and wheezing). ) 1 each 6  . benzonatate (TESSALON) 100 MG capsule TAKE 1 CAPSULE TWICE DAILY AS NEEDED FOR COUGH. 20 capsule 0  . Cholecalciferol 2000 UNITS TABS Take 2,000 Units by mouth daily.    . clorazepate (TRANXENE) 7.5 MG tablet TAKE (1) TABLET TWICE DAILY AS NEEDED FOR ANXIETY. 60 tablet 0  . Dextromethorphan-guaiFENesin ([place] FAST-MAX DM MAX) 5-100 MG/5ML LIQD Take 15 mLs by  mouth every 4 (four) hours as needed (chest congestion).    [name] diltiazem (CARDIZEM CD) 240 MG 24 hr capsule TAKE (1) CAPSULE DAILY. 90 capsule 2  . folic acid (FOLVITE) 1 MG tablet Take 1 tablet (1 mg total) by mouth daily. 90 tablet 1  . HYDROcodone-acetaminophen (NORCO/VICODIN) 5-325 MG tablet Take 1/2 tablet by mouth twice daily as needed for pain. 30 tablet 0  . levothyroxine (SYNTHROID, LEVOTHROID) 25 MCG tablet Take 1 tablet (25 mcg total) by mouth daily before breakfast. 90 tablet 2  . mometasone-formoterol (DULERA) 200-5 MCG/ACT AERO Inhale 2 puffs into the lungs 2 (two) times daily. 1 Inhaler 3  . Multiple Vitamin (MULTIVITAMIN WITH MINERALS) TABS tablet Take 1 tablet by mouth daily. 30 tablet 1  . NEXIUM 40 MG capsule     . ondansetron (ZOFRAN) 4 MG tablet Take 1 tablet (4 mg total) by mouth every 8 (eight) hours as needed for nausea or vomiting. 20 tablet 0  . Polyethyl Glycol-Propyl Glycol (SYSTANE OP) Place 1 drop into both eyes 2 (two) times daily as needed (for dry eyes).     . potassium chloride 20 MEQ/15ML (10%) SOLN Take 30 mLs (40 mEq total) by mouth daily. 3800 mL 0  . promethazine (PHENERGAN) 25 MG tablet Take 1 tablet (25 mg total) by mouth every 8 (eight) hours as needed for nausea or vomiting. 20 tablet 0  . senna-docusate (SENOKOT-S) 8.6-50 MG tablet Take 2 tablets by mouth at bedtime. 60 tablet 2  . sertraline (ZOLOFT) 50 MG tablet Take 1 tablet (50 mg total) by mouth daily. 90 tablet  3  . tiotropium (SPIRIVA HANDIHALER) 18 MCG inhalation capsule Place 1 capsule (18 mcg total) into inhaler and inhale daily. 90 capsule 3  . traMADol (ULTRAM) 50 MG tablet TAKE ONE TABLET EVERY 6 HOURS AS NEEDED FOR MODERATE PAIN. 30 tablet 1  . traZODone (DESYREL) 50 MG tablet TAKE 1/2 TO 1 TABLET AT BEDTIME AS NEEDED FOR REST. 90 tablet 0  . zolpidem (AMBIEN) 5 MG tablet TAKE 1 TABLET AT BEDTIME AS NEEDED. 30 tablet 0   No current facility-administered medications for this visit.      Allergies: Tylenol [acetaminophen]; Symbicort [budesonide-formoterol fumarate]; Duloxetine; Metronidazole; and Rofecoxib  Past Medical History:  Diagnosis Date  . ANXIETY 07/23/2009  . Atherosclerosis of aorta ([place]) 07/24/2015  . Complication of anesthesia 7 or 8 yrs ago   woke up during colonscopy  . Coronary atherosclerosis 07/24/2015  . DEPRESSION 07/23/2009  . DIVERTICULOSIS, COLON 07/23/2009  . GERD 07/23/2009  . Headache(784.0) 07/23/2009   occasional  . Hemorrhoids   . HIP PAIN, LEFT 05/16/2010  . History of Crohn's disease   . HYPERLIPIDEMIA 07/23/2009  . HYPERTENSION 07/23/2009  . HYPOTHYROIDISM 07/23/2009  . IBS (irritable bowel syndrome)   . Insomnia   . LOW BACK PAIN, CHRONIC 10/01/2009  . PARESTHESIA 10/01/2009  . Shingles 2006   back    Past Surgical History:  Procedure Laterality Date  . ABDOMINAL HYSTERECTOMY  age 69 or 57  . APPENDECTOMY  yrs ago  . BILATERAL SALPINGOOPHORECTOMY  age 69 or 46  . COLONOSCOPY N/A 11/17/2013   Procedure: COLONOSCOPY;  Surgeon: Cleotis Nipper, MD;  Location: [facility];  Service: Endoscopy;  Laterality: N/A;  . ESOPHAGOGASTRODUODENOSCOPY N/A 11/17/2013   Procedure: ESOPHAGOGASTRODUODENOSCOPY (EGD);  Surgeon: Cleotis Nipper, MD;  Location: [facility];  Service: Endoscopy;  Laterality: N/A;  . HEMORRHOID SURGERY  yrs ago  . TONSILLECTOMY  yrs ago    Family History  Problem Relation Age of Onset  . Cancer Mother        Unknown type  . COPD Sister   . Cancer Sister        Breast  . Clotting disorder Neg Hx     Social History   Tobacco Use  . Smoking status: Never Smoker  . Smokeless tobacco: Never Used  . Tobacco comment: smoked rarely in youth  Substance Use Topics  . Alcohol use: No    Alcohol/week: 0.0 oz    Subjective:  Patient presents with concerns for episodes of dizziness/ ankle swelling- developed in the past month after appointment here in May; notes that her ankles were very swollen last week and took some  left over Lasix x 3 days with good relief of symptoms ( Lasix 20 mg); Denies any chest pain on exertion or new onset shortness of breath- feels that her COPD is stable; no blood noted in stool/ no abdominal pain; Also complaining of sudden onset of pain in low back/ tops of feet within the past week;   On a positive note, has gained 2 pounds since office visit here 1 month ago;    Objective:  Vitals:   05/31/18 1550  BP: (!) 112/58  Pulse: 77  Temp: 98.4 F (36.9 C)  TempSrc: Oral  SpO2: 96%  Weight: 97 lb 0.6 oz (44 kg)  Height: _0  (1.499 m)    General: Well developed, well nourished, in no acute distress  Skin : Warm and dry.  Head: Normocephalic and atraumatic  Eyes: Sclera and conjunctiva clear; pupils  round and reactive to light; extraocular movements intact  Ears: External normal; canals clear; tympanic membranes normal  Oropharynx: Pink, supple. No suspicious lesions  Neck: Supple without thyromegaly, adenopathy  Lungs: Respirations unlabored; clear to auscultation bilaterally without wheeze, rales, rhonchi  CVS exam: normal rate and regular rhythm.  Musculoskeletal: No deformities; no active joint inflammation  Extremities: No edema, cyanosis, clubbing- no pedal edema is noted today Vessels: Symmetric bilaterally  Neurologic: Alert and oriented; speech intact; face symmetrical; moves all extremities well; CNII-XII intact without focal deficit  Assessment:  1. Pedal edema   2. Dizzy   3. Chronic obstructive pulmonary disease with acute exacerbation ([place])   4. Numbness     Plan:  1. Update CXR today; check CBC, CMP, BNP today; 2. Update EKG- no acute changes seen; may need to consider carotid dopplers, sed rate, CRP;  3. Update Chest CT due to abnormality seen on Chest CT while in the hospital in 01/2018; 4. Update B12 level;   No follow-ups on file.  Orders Placed This Encounter  Procedures  . DG Chest 2 View    Standing Status:   Future    Number of  Occurrences:   1    Standing Expiration Date:   08/01/2019    Order Specific Question:   Reason for Exam (SYMPTOM  OR DIAGNOSIS REQUIRED)    Answer:   pedal edema    Order Specific Question:   Preferred imaging location?    Answer:   [facility]    Order Specific Question:   Radiology Contrast Protocol - do NOT remove file path    Answer:   \\[email or web address]  . CT Chest Wo Contrast    Follow-up on abnormal chest CT from 01/2018; per radiology recommendations, update within 3-4 months.    Standing Status:   Future    Standing Expiration Date:   08/01/2019    Order Specific Question:   Preferred imaging location?    Answer:   [facility]    Order Specific Question:   Radiology Contrast Protocol - do NOT remove file path    Answer:   \\[email or web address]  . CBC w/Diff    Standing Status:   Future    Number of Occurrences:   1    Standing Expiration Date:   05/31/2019  . Comp Met (CMET)    Standing Status:   Future    Number of Occurrences:   1    Standing Expiration Date:   05/31/2019  . B Nat Peptide    Standing Status:   Future    Number of Occurrences:   1    Standing Expiration Date:   05/31/2019  . B12    Standing Status:   Future    Number of Occurrences:   1    Standing Expiration Date:   05/31/2019  . EKG 12-Lead    Requested Prescriptions    No prescriptions requested or ordered in this encounter

## 2018-06-01 LAB — VITAMIN B12: Vitamin B-12: 223 pg/mL (ref 211–911)

## 2018-06-01 NOTE — Progress Notes (Signed)
No acute change seen;

## 2018-06-02 ENCOUNTER — Other Ambulatory Visit: Payer: Self-pay | Admitting: Internal Medicine

## 2018-06-03 ENCOUNTER — Other Ambulatory Visit: Payer: Self-pay | Admitting: Internal Medicine

## 2018-06-03 ENCOUNTER — Other Ambulatory Visit: Payer: Self-pay | Admitting: Family

## 2018-06-04 ENCOUNTER — Ambulatory Visit (INDEPENDENT_AMBULATORY_CARE_PROVIDER_SITE_OTHER): Payer: Medicare Other

## 2018-06-04 DIAGNOSIS — E538 Deficiency of other specified B group vitamins: Secondary | ICD-10-CM | POA: Diagnosis not present

## 2018-06-04 MED ORDER — CYANOCOBALAMIN 1000 MCG/ML IJ SOLN
1000.0000 ug | Freq: Once | INTRAMUSCULAR | Status: AC
  Administered 2018-06-04: 1000 ug via INTRAMUSCULAR

## 2018-06-07 ENCOUNTER — Other Ambulatory Visit: Payer: Self-pay | Admitting: Internal Medicine

## 2018-06-09 ENCOUNTER — Other Ambulatory Visit: Payer: Self-pay | Admitting: Family

## 2018-06-09 ENCOUNTER — Telehealth: Payer: Self-pay | Admitting: Family

## 2018-06-09 ENCOUNTER — Telehealth: Payer: Self-pay | Admitting: *Deleted

## 2018-06-09 MED ORDER — HYDROCODONE-ACETAMINOPHEN 5-325 MG PO TABS: ORAL_TABLET | ORAL | 0 refills | Status: DC

## 2018-06-09 NOTE — Telephone Encounter (Signed)
Duplicate msg 1st msg already sent to [facility] waiting on approval. Will close this encounter.Marland KitchenJohny Chess

## 2018-06-09 NOTE — Telephone Encounter (Signed)
Will put Rx up front for her- she will need to ask for it when she is here for her B12 shot.

## 2018-06-09 NOTE — Telephone Encounter (Signed)
Notified pt w/[name] response.Marland KitchenJohny [name]

## 2018-06-09 NOTE — Telephone Encounter (Signed)
Copied from [place] [id number]. Topic: General - Other >> Jun 09, 2018  9:08 AM Judyann Munson wrote: Reason for CRM: Patient is calling to request a written  prescription HYDROcodone-acetaminophen (NORCO/VICODIN) 5-325 MG tablet, she stated she will be in the office on 7-5 for an appt and would like to pick it up. Please advise

## 2018-06-09 NOTE — Telephone Encounter (Signed)
Copied from [place] [phone] [phone]. Topic: General - Other >> Jun 09, 2018  9:08 AM Judyann Munson wrote: Reason for CRM: Patient is calling to request a written  prescription HYDROcodone-acetaminophen (NORCO/VICODIN) 5-325 MG tablet, she stated she will be in the office on 7-5 for an appt and would like to pick it up. Please advise    Check  registry last filled 04/06/2018.Marland KitchenJohny Chess

## 2018-06-11 ENCOUNTER — Other Ambulatory Visit: Payer: Self-pay | Admitting: Family

## 2018-06-11 ENCOUNTER — Ambulatory Visit (INDEPENDENT_AMBULATORY_CARE_PROVIDER_SITE_OTHER): Payer: Medicare Other

## 2018-06-11 ENCOUNTER — Ambulatory Visit (INDEPENDENT_AMBULATORY_CARE_PROVIDER_SITE_OTHER)
Admission: RE | Admit: 2018-06-11 | Discharge: 2018-06-11 | Disposition: A | Discharge status: Home / Self Care | Payer: Medicare Other | Source: Ambulatory Visit | Attending: Family | Admitting: Family

## 2018-06-11 DIAGNOSIS — J441 Chronic obstructive pulmonary disease with (acute) exacerbation: Secondary | ICD-10-CM | POA: Diagnosis not present

## 2018-06-11 DIAGNOSIS — E538 Deficiency of other specified B group vitamins: Secondary | ICD-10-CM | POA: Diagnosis not present

## 2018-06-11 DIAGNOSIS — R911 Solitary pulmonary nodule: Secondary | ICD-10-CM

## 2018-06-11 MED ORDER — CYANOCOBALAMIN 1000 MCG/ML IJ SOLN
1000.0000 ug | Freq: Once | INTRAMUSCULAR | Status: AC
  Administered 2018-06-11: 1000 ug via INTRAMUSCULAR

## 2018-06-14 ENCOUNTER — Other Ambulatory Visit: Payer: Self-pay | Admitting: Family

## 2018-06-14 DIAGNOSIS — R911 Solitary pulmonary nodule: Secondary | ICD-10-CM

## 2018-06-18 ENCOUNTER — Other Ambulatory Visit (INDEPENDENT_AMBULATORY_CARE_PROVIDER_SITE_OTHER): Payer: Medicare Other

## 2018-06-18 ENCOUNTER — Ambulatory Visit: Payer: Medicare Other

## 2018-06-18 ENCOUNTER — Other Ambulatory Visit: Payer: Self-pay | Admitting: Family

## 2018-06-18 ENCOUNTER — Telehealth: Payer: Self-pay

## 2018-06-18 DIAGNOSIS — E039 Hypothyroidism, unspecified: Secondary | ICD-10-CM

## 2018-06-18 DIAGNOSIS — E538 Deficiency of other specified B group vitamins: Secondary | ICD-10-CM | POA: Diagnosis not present

## 2018-06-18 LAB — VITAMIN B12: Vitamin B-12: 1027 pg/mL — ABNORMAL HIGH (ref 211–911)

## 2018-06-18 LAB — TSH: TSH: 2.44 u[IU]/mL (ref 0.35–4.50)

## 2018-06-18 NOTE — Telephone Encounter (Signed)
Patient complained of several days of itching, especially waking her up at night after b12 administrations---did not give b12 injection today---per laura, patient is going down for b12 and tsh labs today, patient advised we will call her back with lab results and further instructions.

## 2018-06-23 ENCOUNTER — Ambulatory Visit (HOSPITAL_COMMUNITY): Payer: Medicare Other

## 2018-06-23 ENCOUNTER — Other Ambulatory Visit: Payer: Self-pay | Admitting: Family

## 2018-06-28 ENCOUNTER — Other Ambulatory Visit: Payer: Self-pay | Admitting: Family

## 2018-06-28 ENCOUNTER — Encounter (HOSPITAL_COMMUNITY)
Admission: RE | Admit: 2018-06-28 | Discharge: 2018-06-28 | Disposition: A | Discharge status: Home / Self Care | Payer: Medicare Other | Source: Ambulatory Visit | Attending: Family | Admitting: Family

## 2018-06-28 DIAGNOSIS — R911 Solitary pulmonary nodule: Secondary | ICD-10-CM | POA: Insufficient documentation

## 2018-06-28 DIAGNOSIS — R942 Abnormal results of pulmonary function studies: Secondary | ICD-10-CM

## 2018-06-28 LAB — GLUCOSE, CAPILLARY: Glucose-Capillary: 99 mg/dL (ref 70–99)

## 2018-06-28 MED ORDER — FLUDEOXYGLUCOSE F - 18 (FDG) INJECTION
4.9000 | Freq: Once | INTRAVENOUS | Status: AC | PRN
  Administered 2018-06-28: 4.9 via INTRAVENOUS

## 2018-06-28 NOTE — Progress Notes (Signed)
Spoke with patient and her son; they both understand need to see pulmonology to get are biopsied; they both agree that son should be contacted about scheduling so he can arrange his work schedule to drive her.

## 2018-07-01 ENCOUNTER — Other Ambulatory Visit: Payer: Self-pay | Admitting: Internal Medicine

## 2018-07-01 ENCOUNTER — Other Ambulatory Visit: Payer: Self-pay | Admitting: Family

## 2018-07-05 ENCOUNTER — Other Ambulatory Visit: Payer: Self-pay | Admitting: Internal Medicine

## 2018-07-05 NOTE — Telephone Encounter (Signed)
Rx is printed waiting on Signature.

## 2018-07-12 ENCOUNTER — Telehealth: Payer: Self-pay | Admitting: Family

## 2018-07-12 NOTE — Telephone Encounter (Signed)
Patient called and said her refill for her tramadol was denied due to being requested to soon. Patient said she took her last one on Thursday, she said she takes 2 a day. One in the morning and one in the evening. Patient re-filled this on 7/18 with 30 tablets. Please advise

## 2018-07-13 ENCOUNTER — Other Ambulatory Visit: Payer: Self-pay | Admitting: Family

## 2018-07-13 MED ORDER — TRAMADOL HCL 50 MG PO TABS: ORAL_TABLET | ORAL | 1 refills | Status: DC

## 2018-07-13 NOTE — Telephone Encounter (Signed)
Spoke with patient and info given that script had been faxed in today for her.

## 2018-07-16 ENCOUNTER — Ambulatory Visit (INDEPENDENT_AMBULATORY_CARE_PROVIDER_SITE_OTHER): Payer: Medicare Other | Admitting: Pulmonary Disease

## 2018-07-16 ENCOUNTER — Encounter: Payer: Self-pay | Admitting: Pulmonary Disease

## 2018-07-16 VITALS — BP 110/64 | HR 75 | Ht 59.0 in | Wt 100.0 lb

## 2018-07-16 DIAGNOSIS — J439 Emphysema, unspecified: Secondary | ICD-10-CM | POA: Diagnosis not present

## 2018-07-16 DIAGNOSIS — R911 Solitary pulmonary nodule: Secondary | ICD-10-CM

## 2018-07-16 NOTE — Progress Notes (Signed)
lw 

## 2018-07-16 NOTE — Patient Instructions (Signed)
  Patient with an 11 mm lung nodule, was present on prior CT, increased metabolism on PET scan  History of severe COPD on inhalers-seems to be working well  3 recent hospitalizations   Options of treatment that we discussed include  Repeating CT in 3 months, bronchoscopic biopsy, surgical removal  Risk with surgical removal is very high especially with extensive emphysema and frailty  Bronchoscopic biopsy will also be challenging, significant COPD risk of pneumothorax causing a leak  In the lung  Repeating a CT in 3 months will be an option-unfortunately this may be associated with increasing size of the lesion as well-she is very frail with 3 recent hospitalizations--risk of injury with your frail status and significant  We will obtain a pulmonary function study to assess how severe COPD is  You should call if any significant concerns or questions regarding our discussion about biopsy  I will see you back in the office in 4 weeks

## 2018-07-16 NOTE — Progress Notes (Signed)
Heather [name]    [id number]    81-Oct-1936  Primary Care Physician:Murray, Marvis Repress, FNP  Referring Physician: Marrian Salvage, [address] [place], [place] [address]  Chief complaint:   Patient being seen in the office for a lung nodule, abnormal CT scan of the chest/PET scan HPI:  Elderly lady with a history of chronic obstructive pulmonary disease-controlled on inhalers Has had 3 recent hospitalization 1 for her breathing, 2 other admissions relating to colitis She is quite frail She had had a CT scan of the chest in February showing a 1 cm opacity in the right lower lobe-could be related to mucous plugging Repeat CT in July 2019 did reveal a 1.1 cm opacity-this was followed up with a PET scan with an SUV of 2.7  Occupation: No significant occupational history Exposures: No exposure to mold Smoking history: Reformed smoker Travel history: No Significant travel  Outpatient Encounter Medications as of 07/16/2018  Medication Sig  . albuterol (PROVENTIL) (2.5 MG/3ML) 0.083% nebulizer solution INHALE CONTENTS OF 1 AMP VIA NEBULIZER EVERY 6 HRS AS NEEDED FOR WHEEZING/ SHORTNESS OF BREATH.  . Albuterol Sulfate (PROAIR RESPICLICK) 063 (90 Base) MCG/ACT AEPB Inhale 2 puffs into the lungs 4 (four) times daily as needed. (Patient taking differently: Inhale 2 puffs into the lungs 4 (four) times daily as needed (sob and wheezing). )  . benzonatate (TESSALON) 100 MG capsule TAKE 1 CAPSULE TWICE DAILY AS NEEDED FOR COUGH.  . Cholecalciferol 2000 UNITS TABS Take 2,000 Units by mouth daily.  . clorazepate (TRANXENE) 15 MG tablet TAKE 1/2 TABLET TWICE DAILY AS NEEDED FOR ANXIETY.  . clorazepate (TRANXENE) 7.5 MG tablet TAKE (1) TABLET TWICE DAILY AS NEEDED FOR ANXIETY.  . Dextromethorphan-guaiFENesin ([place] FAST-MAX DM MAX) 5-100 MG/5ML LIQD Take 15 mLs by mouth every 4 (four) hours as needed (chest congestion).  [name] diltiazem (CARDIZEM CD) 240 MG 24 hr capsule  TAKE (1) CAPSULE DAILY.  . folic acid (FOLVITE) 1 MG tablet Take 1 tablet (1 mg total) by mouth daily.  [name] HYDROcodone-acetaminophen (NORCO/VICODIN) 5-325 MG tablet Take 1/2 tablet by mouth twice daily as needed for pain.  [name] levothyroxine (SYNTHROID, LEVOTHROID) 25 MCG tablet Take 1 tablet (25 mcg total) by mouth daily before breakfast.  . mometasone-formoterol (DULERA) 200-5 MCG/ACT AERO Inhale 2 puffs into the lungs 2 (two) times daily.  . Multiple Vitamin (MULTIVITAMIN WITH MINERALS) TABS tablet Take 1 tablet by mouth daily.  [name] NEXIUM 40 MG capsule TAKE 1 CAPSULE DAILY BEFORE BREAKFAST.  [name] ondansetron (ZOFRAN) 4 MG tablet Take 1 tablet (4 mg total) by mouth every 8 (eight) hours as needed for nausea or vomiting.  Vladimir Faster Glycol-Propyl Glycol (SYSTANE OP) Place 1 drop into both eyes 2 (two) times daily as needed (for dry eyes).   . potassium chloride 20 MEQ/15ML (10%) SOLN Take 30 mLs (40 mEq total) by mouth daily.  . promethazine (PHENERGAN) 25 MG tablet Take 1 tablet (25 mg total) by mouth every 8 (eight) hours as needed for nausea or vomiting.  . senna-docusate (SENOKOT-S) 8.6-50 MG tablet Take 2 tablets by mouth at bedtime.  . sertraline (ZOLOFT) 50 MG tablet Take 1 tablet (50 mg total) by mouth daily.  [name] tiotropium (SPIRIVA HANDIHALER) 18 MCG inhalation capsule Place 1 capsule (18 mcg total) into inhaler and inhale daily.  . traMADol (ULTRAM) 50 MG tablet Take 1 tablet every 6 hours as needed for moderate pain  . traZODone (DESYREL) 50  MG tablet TAKE 1/2 TO 1 TABLET AT BEDTIME AS NEEDED FOR REST.  [name] zolpidem (AMBIEN) 5 MG tablet TAKE 1 TABLET AT BEDTIME AS NEEDED.   No facility-administered encounter medications on file as of 07/16/2018.     Allergies as of 07/16/2018 - Review Complete 07/16/2018  Allergen Reaction Noted  . Tylenol [acetaminophen] Other (See Comments) 12/11/2015  . Symbicort [budesonide-formoterol fumarate] Other (See Comments) 10/04/2013  . Duloxetine Other (See  Comments)   . Metronidazole Other (See Comments)   . Rofecoxib Other (See Comments) 07/23/2009    Past Medical History:  Diagnosis Date  . ANXIETY 07/23/2009  . Atherosclerosis of aorta ([place]) 07/24/2015  . Complication of anesthesia 7 or 8 yrs ago   woke up during colonscopy  . Coronary atherosclerosis 07/24/2015  . DEPRESSION 07/23/2009  . DIVERTICULOSIS, COLON 07/23/2009  . GERD 07/23/2009  . Headache(784.0) 07/23/2009   occasional  . Hemorrhoids   . HIP PAIN, LEFT 05/16/2010  . History of Crohn's disease   . HYPERLIPIDEMIA 07/23/2009  . HYPERTENSION 07/23/2009  . HYPOTHYROIDISM 07/23/2009  . IBS (irritable bowel syndrome)   . Insomnia   . LOW BACK PAIN, CHRONIC 10/01/2009  . PARESTHESIA 10/01/2009  . Shingles 2006   back    Past Surgical History:  Procedure Laterality Date  . ABDOMINAL HYSTERECTOMY  age 66 or 70  . APPENDECTOMY  yrs ago  . BILATERAL SALPINGOOPHORECTOMY  age 74 or 17  . COLONOSCOPY N/A 11/17/2013   Procedure: COLONOSCOPY;  Surgeon: Cleotis Nipper, MD;  Location: [facility];  Service: Endoscopy;  Laterality: N/A;  . ESOPHAGOGASTRODUODENOSCOPY N/A 11/17/2013   Procedure: ESOPHAGOGASTRODUODENOSCOPY (EGD);  Surgeon: Cleotis Nipper, MD;  Location: [facility];  Service: Endoscopy;  Laterality: N/A;  . HEMORRHOID SURGERY  yrs ago  . TONSILLECTOMY  yrs ago    Family History  Problem Relation Age of Onset  . Cancer Mother        Unknown type  . COPD Sister   . Cancer Sister        Breast  . Clotting disorder Neg Hx     Social History   Socioeconomic History  . Marital status: Widowed    Spouse name: Not on file  . Number of children: 1  . Years of education: Not on file  . Highest education level: Not on file  Occupational History  . Not on file  Social Needs  . Financial resource strain: Not on file  . Food insecurity:    Worry: Not on file    Inability: Not on file  . Transportation needs:    Medical: Not on file    Non-medical: Not on  file  Tobacco Use  . Smoking status: Former Smoker    Packs/day: 0.50    Types: Cigarettes    Start date: 07/16/1960    Last attempt to quit: 07/16/1998    Years since quitting: 20.0  . Smokeless tobacco: Never Used  Substance and Sexual Activity  . Alcohol use: No    Alcohol/week: 0.0 standard drinks  . Drug use: No  . Sexual activity: Not on file  Lifestyle  . Physical activity:    Days per week: Not on file    Minutes per session: Not on file  . Stress: Not on file  Relationships  . Social connections:    Talks on phone: Not on file    Gets together: Not on file    Attends religious service: Not on file    Active member  of club or organization: Not on file    Attends meetings of clubs or organizations: Not on file    Relationship status: Not on file  . Intimate partner violence:    Fear of current or ex partner: Not on file    Emotionally abused: Not on file    Physically abused: Not on file    Forced sexual activity: Not on file  Other Topics Concern  . Not on file  Social History Narrative   Widowed.  Lives alone in her own home.  Ambulates with a cane/walker when needed.    Review of systems: Review of Systems  Constitutional: Negative for fever and chills.  HENT: Negative.   Eyes: Negative for blurred vision.  Respiratory: Shortness of breath with exertion, does not have a cough at present Cardiovascular: Negative for chest pain and palpitations.  Gastrointestinal: Negative for vomiting, diarrhea, blood per rectum. Genitourinary: Negative for dysuria, urgency, frequency and hematuria.  Musculoskeletal: Negative for myalgias, back pain and joint pain.  Skin: Negative for itching and rash.  Neurological: Negative for dizziness, tremors, focal weakness, seizures and loss of consciousness.  Endo/Heme/Allergies: Negative for environmental allergies.  Psychiatric/Behavioral: Negative for depression, suicidal ideas and hallucinations.  All other systems reviewed and  are negative.  Physical Exam:  Vitals:   07/16/18 1110  BP: 110/64  Pulse: 75  SpO2: 96%   Gen:      No acute distress, generally frail HEENT:  EOMI, sclera anicteric Neck:     No masses; no thyromegaly Lungs:    Decreased air movement bilaterally, no rales CV:         Regular rate and rhythm; no murmurs Abd:      + bowel sounds; soft, non-tender; no palpable masses, no distension Ext:    No edema; adequate peripheral perfusion Skin:      Warm and dry; no rash Neuro: alert and oriented x 3 Psych: normal mood and affect  Data Reviewed: .  CT scan of the chest reviewed with the patient  .  PET scan reviewed with the patient . [name] Recent hospital records relating to recent admissions reviewed   Assessment:  .  Abnormal radiological study-CT and PET with a right lower lobe 1.1 cm lesion, 2.7 SUV on PET scan  .  History of COPD/emphysema  .  Frail  .  Plan/Recommendations:  .  Obtain pulmonary function study  .  Options of investigation discussed with the patient including bronchoscopy, surgical biopsy/excision  .  Based on her frail status and possible recent admissions-risk of decompensation is high  .  She will likely not tolerate surgical excision--we will obtain a PFT to ascertain severity of obstructive lung disease  .  Nodule is small, vascular bundle is close to this lesion as well-risk with biopsy will include bleeding, pneumothorax  .  Repeating the CT in 3 months is an option, to be taken in the context of her usual health which is already challenged by advanced COPD, frail status-may not be able to tolerate risks/complications with invasive testing  .  Continue inhaler use  .  I will see her back in the office in about 4 weeks  .  She was advised to give me a call if she changes her mind with respect to invasive testing   Sherrilyn Rist MD [facility] 07/16/2018, 11:57 AM  CC: Marrian Salvage,*

## 2018-08-02 NOTE — Progress Notes (Addendum)
Subjective:   Heather [name] is a 83 y.o. female who presents for Medicare Annual (Subsequent) preventive examination.  Review of Systems:  No ROS.  Medicare Wellness Visit. Additional risk factors are reflected in the social history.  Cardiac Risk Factors include: advanced age (>65men, >41 women);dyslipidemia;hypertension Sleep patterns: gets up 1-2 times nightly to void and sleeps 5-6 hours nightly.    Home Safety/Smoke Alarms: Feels safe in home. Smoke alarms in place.  Living environment; residence and Firearm Safety: 1-story house/ trailer, no firearms. Lives alone, no needs for DME, good support system Seat Belt Safety/Bike Helmet: Wears seat belt.      Objective:     Vitals: BP 131/64   Pulse 76   Resp 17   Ht 4\' 11"  (1.499 m)   Wt 101 lb (45.8 kg)   SpO2 98%   BMI 20.40 kg/m   Body mass index is 20.4 kg/m.  Advanced Directives 08/03/2018 02/28/2018 02/19/2018 01/28/2018 01/28/2018 07/31/2017 05/28/2016  Does Patient Have a Medical Advance Directive? Yes Yes Yes Yes Yes Yes Yes  Type of Paramedic of [place];Living will Living will Living will Living will Living will - Living will  Does patient want to make changes to medical advance directive? - No - Patient declined No - Patient declined No - Patient declined - - -  Copy of [place] in Chart? Yes - - - - - -  Would patient like information on creating a medical advance directive? - - - - - - -    Tobacco Social History   Tobacco Use  Smoking Status Former Smoker  . Packs/day: 0.50  . Types: Cigarettes  . Start date: 07/16/1960  . Last attempt to quit: 07/16/1998  . Years since quitting: 20.0  Smokeless Tobacco Never Used     Counseling given: Not Answered  Past Medical History:  Diagnosis Date  . ANXIETY 07/23/2009  . Atherosclerosis of aorta ([place]) 07/24/2015  . Complication of anesthesia 7 or 8 yrs ago   woke up during colonscopy  . Coronary atherosclerosis  07/24/2015  . DEPRESSION 07/23/2009  . DIVERTICULOSIS, COLON 07/23/2009  . GERD 07/23/2009  . Headache(784.0) 07/23/2009   occasional  . Hemorrhoids   . HIP PAIN, LEFT 05/16/2010  . History of Crohn's disease   . HYPERLIPIDEMIA 07/23/2009  . HYPERTENSION 07/23/2009  . HYPOTHYROIDISM 07/23/2009  . IBS (irritable bowel syndrome)   . Insomnia   . LOW BACK PAIN, CHRONIC 10/01/2009  . PARESTHESIA 10/01/2009  . Shingles 2006   back   Past Surgical History:  Procedure Laterality Date  . ABDOMINAL HYSTERECTOMY  age 39 or 6  . APPENDECTOMY  yrs ago  . BILATERAL SALPINGOOPHORECTOMY  age 6 or 59  . COLONOSCOPY N/A 11/17/2013   Procedure: COLONOSCOPY;  Surgeon: Cleotis Nipper, MD;  Location: [facility];  Service: Endoscopy;  Laterality: N/A;  . ESOPHAGOGASTRODUODENOSCOPY N/A 11/17/2013   Procedure: ESOPHAGOGASTRODUODENOSCOPY (EGD);  Surgeon: Cleotis Nipper, MD;  Location: [facility];  Service: Endoscopy;  Laterality: N/A;  . HEMORRHOID SURGERY  yrs ago  . TONSILLECTOMY  yrs ago   Family History  Problem Relation Age of Onset  . Cancer Mother        Unknown type  . COPD Sister   . Cancer Sister        Breast  . Clotting disorder Neg Hx    Social History   Socioeconomic History  . Marital status: Widowed    Spouse name: Not  on file  . Number of children: 1  . Years of education: Not on file  . Highest education level: Not on file  Occupational History  . Not on file  Social Needs  . Financial resource strain: Not hard at all  . Food insecurity:    Worry: Never true    Inability: Never true  . Transportation needs:    Medical: No    Non-medical: No  Tobacco Use  . Smoking status: Former Smoker    Packs/day: 0.50    Types: Cigarettes    Start date: 07/16/1960    Last attempt to quit: 07/16/1998    Years since quitting: 20.0  . Smokeless tobacco: Never Used  Substance and Sexual Activity  . Alcohol use: No    Alcohol/week: 0.0 standard drinks  . Drug use: No  . Sexual  activity: Never  Lifestyle  . Physical activity:    Days per week: 0 days    Minutes per session: 0 min  . Stress: To some extent  Relationships  . Social connections:    Talks on phone: More than three times a week    Gets together: More than three times a week    Attends religious service: Never    Active member of club or organization: No    Attends meetings of clubs or organizations: Never    Relationship status: Widowed  Other Topics Concern  . Not on file  Social History Narrative   Widowed.  Lives alone in her own home.  Ambulates with a cane/walker when needed.    Outpatient Encounter Medications as of 08/03/2018  Medication Sig  . Albuterol Sulfate (PROAIR RESPICLICK) 237 (90 Base) MCG/ACT AEPB Inhale 2 puffs into the lungs 4 (four) times daily as needed. (Patient taking differently: Inhale 2 puffs into the lungs 4 (four) times daily as needed (sob and wheezing). )  . benzonatate (TESSALON) 100 MG capsule TAKE 1 CAPSULE TWICE DAILY AS NEEDED FOR COUGH.  . Cholecalciferol 2000 UNITS TABS Take 2,000 Units by mouth daily.  . clorazepate (TRANXENE) 15 MG tablet TAKE 1/2 TABLET TWICE DAILY AS NEEDED FOR ANXIETY.  . Dextromethorphan-guaiFENesin ([place] FAST-MAX DM MAX) 5-100 MG/5ML LIQD Take 15 mLs by mouth every 4 (four) hours as needed (chest congestion).  [name] diltiazem (CARDIZEM CD) 240 MG 24 hr capsule TAKE (1) CAPSULE DAILY.  . folic acid (FOLVITE) 1 MG tablet Take 1 tablet (1 mg total) by mouth daily.  [name] HYDROcodone-acetaminophen (NORCO/VICODIN) 5-325 MG tablet Take 1/2 tablet by mouth twice daily as needed for pain.  [name] levothyroxine (SYNTHROID, LEVOTHROID) 25 MCG tablet Take 1 tablet (25 mcg total) by mouth daily before breakfast.  . mometasone-formoterol (DULERA) 200-5 MCG/ACT AERO Inhale 2 puffs into the lungs 2 (two) times daily.  . Multiple Vitamin (MULTIVITAMIN WITH MINERALS) TABS tablet Take 1 tablet by mouth daily.  [name] NEXIUM 40 MG capsule TAKE 1 CAPSULE DAILY BEFORE  BREAKFAST.  [name] ondansetron (ZOFRAN) 4 MG tablet Take 1 tablet (4 mg total) by mouth every 8 (eight) hours as needed for nausea or vomiting.  Vladimir Faster Glycol-Propyl Glycol (SYSTANE OP) Place 1 drop into both eyes 2 (two) times daily as needed (for dry eyes).   . promethazine (PHENERGAN) 25 MG tablet Take 1 tablet (25 mg total) by mouth every 8 (eight) hours as needed for nausea or vomiting.  . senna-docusate (SENOKOT-S) 8.6-50 MG tablet Take 2 tablets by mouth at bedtime.  . sertraline (ZOLOFT) 50 MG tablet Take 1 tablet (50  mg total) by mouth daily.  [name] tiotropium (SPIRIVA HANDIHALER) 18 MCG inhalation capsule Place 1 capsule (18 mcg total) into inhaler and inhale daily.  . traMADol (ULTRAM) 50 MG tablet Take 1 tablet every 6 hours as needed for moderate pain  . traZODone (DESYREL) 50 MG tablet TAKE 1/2 TO 1 TABLET AT BEDTIME AS NEEDED FOR REST.  [name] zolpidem (AMBIEN) 5 MG tablet TAKE 1 TABLET AT BEDTIME AS NEEDED.  . [DISCONTINUED] albuterol (PROVENTIL) (2.5 MG/3ML) 0.083% nebulizer solution INHALE CONTENTS OF 1 AMP VIA NEBULIZER EVERY 6 HRS AS NEEDED FOR WHEEZING/ SHORTNESS OF BREATH. (Patient not taking: Reported on 08/03/2018)  . [DISCONTINUED] clorazepate (TRANXENE) 7.5 MG tablet TAKE (1) TABLET TWICE DAILY AS NEEDED FOR ANXIETY. (Patient not taking: Reported on 08/03/2018)  . [DISCONTINUED] potassium chloride 20 MEQ/15ML (10%) SOLN Take 30 mLs (40 mEq total) by mouth daily. (Patient not taking: Reported on 08/03/2018)   No facility-administered encounter medications on file as of 08/03/2018.     Activities of Daily Living In your present state of health, do you have any difficulty performing the following activities: 08/03/2018 02/28/2018  Hearing? N N  Vision? N Y  Difficulty concentrating or making decisions? N Y  Walking or climbing stairs? Y Y  Dressing or bathing? N Y  Doing errands, shopping? Tempie Donning  Preparing Food and eating ? Y -  Using the Toilet? N -  In the past six months, have you  accidently leaked urine? N -  Do you have problems with loss of bowel control? Y -  Managing your Medications? N -  Managing your Finances? N -  Housekeeping or managing your Housekeeping? N -  Some recent data might be hidden    Patient Care Team: Marrian Salvage, [place] as PCP - General (Internal Medicine)    Assessment:   This is a routine wellness examination for Kema. Physical assessment deferred to PCP.   Exercise Activities and Dietary recommendations Current Exercise Habits: Home exercise routine(chair exercise print-outs provided), Time (Minutes): 20, Frequency (Times/Week): 6, Weekly Exercise (Minutes/Week): 120, Intensity: Mild, Exercise limited by: orthopedic condition(s);respiratory conditions(s)  Diet (meal preparation, eat out, water intake, caffeinated beverages, dairy products, fruits and vegetables): in general, a "healthy" diet   States her appetite is improving she does supplement with Boost.   Reviewed heart healthy diet. Encouraged patient to increase daily water, healthy fluid intake, and to eat small frequent bland meals.   Goals    . Gain weight     Keep weight stable around 110  Eat snacks through out the day to keep you healthy     . Patient Stated     Stay as healthy and as independent as possible by continuing to do physical therapy exercises and starting to do some chair exercises to maintain my strength.       Fall Risk Fall Risk  08/03/2018 07/31/2017 07/31/2017 06/11/2016 01/08/2015  Falls in the past year? No No Yes Yes Yes  Comment - no falls this year - - -  Number falls in past yr: - - 1 1 1   Injury with Fall? - - Yes Yes No  Risk for fall due to : Impaired balance/gait;Impaired mobility - - Other (Comment) Impaired balance/gait  Risk for fall due to: Comment - - - slipped out of a chair -    Depression Screen PHQ 2/9 Scores 08/03/2018 07/31/2017 07/31/2017 07/31/2017  PHQ - 2 Score 3 0 0 0  PHQ- 9 Score 7 - - -  Cognitive  Function MMSE - Mini Mental State Exam 08/03/2018  Not completed: Refused  Ad8 score reviewed for issues:  Issues making decisions: no  Less interest in hobbies / activities: no  Repeats questions, stories (family complaining): no  Trouble using ordinary gadgets (microwave, computer, phone):no  Forgets the month or year: no  Mismanaging finances: no  Remembering appts: no  Daily problems with thinking and/or memory: yes Ad8 score is= 1   6CIT Screen 07/31/2017  What Year? 0 points  What month? 0 points  What time? 0 points  Count back from 20 0 points  Months in reverse 0 points  Repeat phrase 0 points  Total Score 0    Immunization History  Administered Date(s) Administered  . Influenza Split 09/22/2011, 09/22/2012  . Influenza Whole 10/01/2009, 08/20/2010  . Influenza, High Dose Seasonal PF 08/28/2015, 09/22/2016, 07/31/2017  . Influenza,inj,Quad PF,6+ Mos 08/25/2014  . Influenza-Unspecified 10/08/2013  . Pneumococcal Conjugate-13 08/25/2014  . Pneumococcal Polysaccharide-23 09/22/2011  . Zoster 11/15/2010   Screening Tests Health Maintenance  Topic Date Due  . INFLUENZA VACCINE  03/23/2019 (Originally 07/08/2018)  . TETANUS/TDAP  08/27/2025 (Originally 06/18/1954)  . DEXA SCAN  Completed  . PNA vac Low Risk Adult  Completed      Plan:    Continue doing brain stimulating activities (puzzles, reading, adult coloring books, staying active) to keep memory sharp.   Continue to eat heart healthy diet (full of fruits, vegetables, whole grains, lean protein, water--limit salt, fat, and sugar intake) and increase physical activity as tolerated.  I have personally reviewed and noted the following in the patient's chart:   . Medical and social history . Use of alcohol, tobacco or illicit drugs  . Current medications and supplements . Functional ability and status . Nutritional status . Physical activity . Advanced directives . List of other  physicians . Vitals . Screenings to include cognitive, depression, and falls . Referrals and appointments  In addition, I have reviewed and discussed with patient certain preventive protocols, quality metrics, and best practice recommendations. A written personalized care plan for preventive services as well as general preventive health recommendations were provided to patient.     Michiel Cowboy, RN  08/03/2018  Medical screening examination/treatment/procedure(s) were performed by non-physician practitioner and as supervising provider I was immediately available for consultation/collaboration.  I agree with above. Marrian Salvage, FNP

## 2018-08-03 ENCOUNTER — Telehealth: Payer: Self-pay | Admitting: *Deleted

## 2018-08-03 ENCOUNTER — Ambulatory Visit: Payer: Medicare Other

## 2018-08-03 ENCOUNTER — Ambulatory Visit (INDEPENDENT_AMBULATORY_CARE_PROVIDER_SITE_OTHER): Payer: Medicare Other | Admitting: *Deleted

## 2018-08-03 VITALS — BP 131/64 | HR 76 | Resp 17 | Ht 59.0 in | Wt 101.0 lb

## 2018-08-03 DIAGNOSIS — Z0000 Encounter for general adult medical examination without abnormal findings: Secondary | ICD-10-CM | POA: Diagnosis not present

## 2018-08-03 NOTE — Telephone Encounter (Signed)
During AWV, patient stated she needed a refill for phenergan. She also added that due to colitis, she has abdominal cramping and asked if PCP could prescribe anything to help.

## 2018-08-03 NOTE — Patient Instructions (Addendum)
Continue doing brain stimulating activities (puzzles, reading, adult coloring books, staying active) to keep memory sharp.   Continue to eat heart healthy diet (full of fruits, vegetables, whole grains, lean protein, water--limit salt, fat, and sugar intake) and increase physical activity as tolerated.  Health Maintenance, Female Adopting a healthy lifestyle and getting preventive care can go a long way to promote health and wellness. Talk with your health care provider about what schedule of regular examinations is right for you. This is a good chance for you to check in with your provider about disease prevention and staying healthy. In between checkups, there are plenty of things you can do on your own. Experts have done a lot of research about which lifestyle changes and preventive measures are most likely to keep you healthy. Ask your health care provider for more information. Weight and diet Eat a healthy diet  Be sure to include plenty of vegetables, fruits, low-fat dairy products, and lean protein.  Do not eat a lot of foods high in solid fats, added sugars, or salt.  Get regular exercise. This is one of the most important things you can do for your health. ? Most adults should exercise for at least 150 minutes each week. The exercise should increase your heart rate and make you sweat (moderate-intensity exercise). ? Most adults should also do strengthening exercises at least twice a week. This is in addition to the moderate-intensity exercise.  Maintain a healthy weight  Body mass index (BMI) is a measurement that can be used to identify possible weight problems. It estimates body fat based on height and weight. Your health care provider can help determine your BMI and help you achieve or maintain a healthy weight.  For females 40 years of age and older: ? A BMI below 18.5 is considered underweight. ? A BMI of 18.5 to 24.9 is normal. ? A BMI of 25 to 29.9 is considered  overweight. ? A BMI of 30 and above is considered obese.  Watch levels of cholesterol and blood lipids  You should start having your blood tested for lipids and cholesterol at 81 years of age, then have this test every 5 years.  You may need to have your cholesterol levels checked more often if: ? Your lipid or cholesterol levels are high. ? You are older than 81 years of age. ? You are at high risk for heart disease.  Cancer screening Lung Cancer  Lung cancer screening is recommended for adults 53-15 years old who are at high risk for lung cancer because of a history of smoking.  A yearly low-dose CT scan of the lungs is recommended for people who: ? Currently smoke. ? Have quit within the past 15 years. ? Have at least a 30-pack-year history of smoking. A pack year is smoking an average of one pack of cigarettes a day for 1 year.  Yearly screening should continue until it has been 15 years since you quit.  Yearly screening should stop if you develop a health problem that would prevent you from having lung cancer treatment.  Breast Cancer  Practice breast self-awareness. This means understanding how your breasts normally appear and feel.  It also means doing regular breast self-exams. Let your health care provider know about any changes, no matter how small.  If you are in your 20s or 30s, you should have a clinical breast exam (CBE) by a health care provider every 1-3 years as part of a regular health exam.  If  you are 40 or older, have a CBE every year. Also consider having a breast X-ray (mammogram) every year.  If you have a family history of breast cancer, talk to your health care provider about genetic screening.  If you are at high risk for breast cancer, talk to your health care provider about having an MRI and a mammogram every year.  Breast cancer gene (BRCA) assessment is recommended for women who have family members with BRCA-related cancers. BRCA-related cancers  include: ? Breast. ? Ovarian. ? Tubal. ? Peritoneal cancers.  Results of the assessment will determine the need for genetic counseling and BRCA1 and BRCA2 testing.  Cervical Cancer Your health care provider may recommend that you be screened regularly for cancer of the pelvic organs (ovaries, uterus, and vagina). This screening involves a pelvic examination, including checking for microscopic changes to the surface of your cervix (Pap test). You may be encouraged to have this screening done every 3 years, beginning at age 34.  For women ages 5-65, health care providers may recommend pelvic exams and Pap testing every 3 years, or they may recommend the Pap and pelvic exam, combined with testing for human papilloma virus (HPV), every 5 years. Some types of HPV increase your risk of cervical cancer. Testing for HPV may also be done on women of any age with unclear Pap test results.  Other health care providers may not recommend any screening for nonpregnant women who are considered low risk for pelvic cancer and who do not have symptoms. Ask your health care provider if a screening pelvic exam is right for you.  If you have had past treatment for cervical cancer or a condition that could lead to cancer, you need Pap tests and screening for cancer for at least 20 years after your treatment. If Pap tests have been discontinued, your risk factors (such as having a new sexual partner) need to be reassessed to determine if screening should resume. Some women have medical problems that increase the chance of getting cervical cancer. In these cases, your health care provider may recommend more frequent screening and Pap tests.  Colorectal Cancer  This type of cancer can be detected and often prevented.  Routine colorectal cancer screening usually begins at 81 years of age and continues through 81 years of age.  Your health care provider may recommend screening at an earlier age if you have risk factors  for colon cancer.  Your health care provider may also recommend using home test kits to check for hidden blood in the stool.  A small camera at the end of a tube can be used to examine your colon directly (sigmoidoscopy or colonoscopy). This is done to check for the earliest forms of colorectal cancer.  Routine screening usually begins at age 70.  Direct examination of the colon should be repeated every 5-10 years through 81 years of age. However, you may need to be screened more often if early forms of precancerous polyps or small growths are found.  Skin Cancer  Check your skin from head to toe regularly.  Tell your health care provider about any new moles or changes in moles, especially if there is a change in a mole's shape or color.  Also tell your health care provider if you have a mole that is larger than the size of a pencil eraser.  Always use sunscreen. Apply sunscreen liberally and repeatedly throughout the day.  Protect yourself by wearing long sleeves, pants, a wide-brimmed hat, and sunglasses  whenever you are outside.  Heart disease, diabetes, and high blood pressure  High blood pressure causes heart disease and increases the risk of stroke. High blood pressure is more likely to develop in: ? People who have blood pressure in the high end of the normal range (130-139/85-89 mm Hg). ? People who are overweight or obese. ? People who are African American.  If you are 64-28 years of age, have your blood pressure checked every 3-5 years. If you are 58 years of age or older, have your blood pressure checked every year. You should have your blood pressure measured twice-once when you are at a hospital or clinic, and once when you are not at a hospital or clinic. Record the average of the two measurements. To check your blood pressure when you are not at a hospital or clinic, you can use: ? An automated blood pressure machine at a pharmacy. ? A home blood pressure monitor.  If  you are between 46 years and 42 years old, ask your health care provider if you should take aspirin to prevent strokes.  Have regular diabetes screenings. This involves taking a blood sample to check your fasting blood sugar level. ? If you are at a normal weight and have a low risk for diabetes, have this test once every three years after 81 years of age. ? If you are overweight and have a high risk for diabetes, consider being tested at a younger age or more often. Preventing infection Hepatitis B  If you have a higher risk for hepatitis B, you should be screened for this virus. You are considered at high risk for hepatitis B if: ? You were born in a country where hepatitis B is common. Ask your health care provider which countries are considered high risk. ? Your parents were born in a high-risk country, and you have not been immunized against hepatitis B (hepatitis B vaccine). ? You have HIV or AIDS. ? You use needles to inject street drugs. ? You live with someone who has hepatitis B. ? You have had sex with someone who has hepatitis B. ? You get hemodialysis treatment. ? You take certain medicines for conditions, including cancer, organ transplantation, and autoimmune conditions.  Hepatitis C  Blood testing is recommended for: ? Everyone born from [birth year] through 1963. ? Anyone with known risk factors for hepatitis C.  Sexually transmitted infections (STIs)  You should be screened for sexually transmitted infections (STIs) including gonorrhea and chlamydia if: ? You are sexually active and are younger than 81 years of age. ? You are older than 81 years of age and your health care provider tells you that you are at risk for this type of infection. ? Your sexual activity has changed since you were last screened and you are at an increased risk for chlamydia or gonorrhea. Ask your health care provider if you are at risk.  If you do not have HIV, but are at risk, it may be recommended  that you take a prescription medicine daily to prevent HIV infection. This is called pre-exposure prophylaxis (PrEP). You are considered at risk if: ? You are sexually active and do not regularly use condoms or know the HIV status of your partner(s). ? You take drugs by injection. ? You are sexually active with a partner who has HIV.  Talk with your health care provider about whether you are at high risk of being infected with HIV. If you choose to begin PrEP, you should  first be tested for HIV. You should then be tested every 3 months for as long as you are taking PrEP. Pregnancy  If you are premenopausal and you may become pregnant, ask your health care provider about preconception counseling.  If you may become pregnant, take 400 to 800 micrograms (mcg) of folic acid every day.  If you want to prevent pregnancy, talk to your health care provider about birth control (contraception). Osteoporosis and menopause  Osteoporosis is a disease in which the bones lose minerals and strength with aging. This can result in serious bone fractures. Your risk for osteoporosis can be identified using a bone density scan.  If you are 38 years of age or older, or if you are at risk for osteoporosis and fractures, ask your health care provider if you should be screened.  Ask your health care provider whether you should take a calcium or vitamin D supplement to lower your risk for osteoporosis.  Menopause may have certain physical symptoms and risks.  Hormone replacement therapy may reduce some of these symptoms and risks. Talk to your health care provider about whether hormone replacement therapy is right for you. Follow these instructions at home:  Schedule regular health, dental, and eye exams.  Stay current with your immunizations.  Do not use any tobacco products including cigarettes, chewing tobacco, or electronic cigarettes.  If you are pregnant, do not drink alcohol.  If you are  breastfeeding, limit how much and how often you drink alcohol.  Limit alcohol intake to no more than 1 drink per day for nonpregnant women. One drink equals 12 ounces of beer, 5 ounces of wine, or 1 ounces of hard liquor.  Do not use street drugs.  Do not share needles.  Ask your health care provider for help if you need support or information about quitting drugs.  Tell your health care provider if you often feel depressed.  Tell your health care provider if you have ever been abused or do not feel safe at home. This information is not intended to replace advice given to you by your health care provider. Make sure you discuss any questions you have with your health care provider. Document Released: 06/09/2011 Document Revised: 05/01/2016 Document Reviewed: 08/28/2015 Elsevier Interactive Patient Education  [employer].

## 2018-08-04 ENCOUNTER — Telehealth: Payer: Self-pay | Admitting: *Deleted

## 2018-08-04 MED ORDER — PROMETHAZINE HCL 25 MG PO TABS: 25.0000 mg | ORAL_TABLET | Freq: Three times a day (TID) | ORAL | 0 refills | Status: DC | PRN

## 2018-08-04 NOTE — Telephone Encounter (Signed)
Called to inform patient that the phenergan prescription was sent to her pharmacy and to explain that PCP wants her to call her GI doctor to address her abdominal cramping as her colitis has been severe enough to cause a hospitalization in the past. Patient verbalized understanding and was appreciative for the call-back.

## 2018-08-04 NOTE — Telephone Encounter (Signed)
I will send in the refill for Phenergan; She asked about pain due to colitis- I want her to go back to her GI due to her history with this issue which called hospitalization earlier this year.

## 2018-08-04 NOTE — Telephone Encounter (Signed)
Spoke with patient and info given 

## 2018-08-10 ENCOUNTER — Other Ambulatory Visit: Payer: Self-pay | Admitting: Family

## 2018-08-10 ENCOUNTER — Other Ambulatory Visit: Payer: Self-pay | Admitting: Internal Medicine

## 2018-08-12 ENCOUNTER — Other Ambulatory Visit: Payer: Self-pay | Admitting: Internal Medicine

## 2018-08-13 ENCOUNTER — Encounter: Payer: Self-pay | Admitting: Pulmonary Disease

## 2018-08-13 ENCOUNTER — Ambulatory Visit (INDEPENDENT_AMBULATORY_CARE_PROVIDER_SITE_OTHER): Payer: Medicare Other | Admitting: Pulmonary Disease

## 2018-08-13 VITALS — BP 120/60 | HR 67 | Ht <= 58 in | Wt 98.0 lb

## 2018-08-13 DIAGNOSIS — Z23 Encounter for immunization: Secondary | ICD-10-CM | POA: Diagnosis not present

## 2018-08-13 DIAGNOSIS — J439 Emphysema, unspecified: Secondary | ICD-10-CM

## 2018-08-13 DIAGNOSIS — R911 Solitary pulmonary nodule: Secondary | ICD-10-CM | POA: Diagnosis not present

## 2018-08-13 LAB — PULMONARY FUNCTION TEST
DL/VA % pred: 60 %
DL/VA: 2.35 ml/min/mmHg/L
DLCO unc % pred: 53 %
DLCO unc: 8.62 ml/min/mmHg
FEF 25-75 Post: 0.75 L/sec
FEF 25-75 Pre: 0.48 L/sec
FEF2575-%Change-Post: 57 %
FEF2575-%Pred-Post: 73 %
FEF2575-%Pred-Pre: 46 %
FEV1-%Change-Post: 14 %
FEV1-%Pred-Post: 91 %
FEV1-%Pred-Pre: 79 %
FEV1-Post: 1.24 L
FEV1-Pre: 1.09 L
FEV1FVC-%Change-Post: 7 %
FEV1FVC-%Pred-Pre: 77 %
FEV6-%Change-Post: 7 %
FEV6-%Pred-Post: 115 %
FEV6-%Pred-Pre: 106 %
FEV6-Post: 2 L
FEV6-Pre: 1.85 L
FEV6FVC-%Change-Post: 1 %
FEV6FVC-%Pred-Post: 105 %
FEV6FVC-%Pred-Pre: 104 %
FVC-%Change-Post: 6 %
FVC-%Pred-Post: 108 %
FVC-%Pred-Pre: 101 %
FVC-Post: 2.02 L
FVC-Pre: 1.9 L
Post FEV1/FVC ratio: 61 %
Post FEV6/FVC ratio: 99 %
Pre FEV1/FVC ratio: 57 %
Pre FEV6/FVC Ratio: 98 %

## 2018-08-13 NOTE — Patient Instructions (Signed)
Lung nodule  Chronic obstructive pulmonary disease  Pulmonary function test significant for mild obstructive lung disease and reviewed with you   The plan will be to repeat CT in 2 months to follow-up on the nodule, this will be 3 months after previous study  I will see you after that  We will provide you with a flu shot today

## 2018-08-13 NOTE — Progress Notes (Signed)
PFT done today. 

## 2018-08-13 NOTE — Progress Notes (Signed)
Heather [name]    [id number]    [birth year]    Primary Care Physician:Murray, Marvis Repress, FNP  Referring Physician: Marrian Salvage, [employer] [place], [place] [address]  Chief complaint:   Patient being seen in the office for a lung nodule, abnormal CT scan of the chest/PET scan Symptoms have been better with inhaler use  HPI:  Elderly lady with a history of chronic obstructive pulmonary disease-controlled on inhalers Has had 3 recent hospitalization 1 for her breathing, 2 other admissions relating to colitis She is quite frail-she feels better today  She had had a CT scan of the chest in February showing a 1 cm opacity in the right lower lobe-could be related to mucous plugging Repeat CT in July 2019 did reveal a 1.1 cm opacity-this was followed up with a PET scan with an SUV of 2.7-we did look at the CT together during the last visit We discussed options of intervention which at present is very risky as she is quite frail We agreed on repeating CT after 3 months to see if there is any significant evolution  Occupation: No significant occupational history Exposures: No exposure to mold Smoking history: Reformed smoker Travel history: No Significant travel  Outpatient Encounter Medications as of 08/13/2018  Medication Sig  . Albuterol Sulfate (PROAIR RESPICLICK) 726 (90 Base) MCG/ACT AEPB Inhale 2 puffs into the lungs 4 (four) times daily as needed. (Patient taking differently: Inhale 2 puffs into the lungs 4 (four) times daily as needed (sob and wheezing). )  . benzonatate (TESSALON) 100 MG capsule TAKE 1 CAPSULE TWICE DAILY AS NEEDED FOR COUGH.  . Cholecalciferol 2000 UNITS TABS Take 2,000 Units by mouth daily.  . clorazepate (TRANXENE) 15 MG tablet TAKE 1/2 TABLET TWICE DAILY AS NEEDED FOR ANXIETY.  . Dextromethorphan-guaiFENesin (Herman FAST-MAX DM MAX) 5-100 MG/5ML LIQD Take 15 mLs by mouth every 4 (four) hours as needed (chest congestion).    [name] diltiazem (CARDIZEM CD) 240 MG 24 hr capsule TAKE (1) CAPSULE DAILY.  . folic acid (FOLVITE) 1 MG tablet Take 1 tablet (1 mg total) by mouth daily.  [name] HYDROcodone-acetaminophen (NORCO/VICODIN) 5-325 MG tablet Take 1/2 tablet by mouth twice daily as needed for pain.  [name] levothyroxine (SYNTHROID, LEVOTHROID) 25 MCG tablet Take 1 tablet (25 mcg total) by mouth daily before breakfast.  . mometasone-formoterol (DULERA) 200-5 MCG/ACT AERO Inhale 2 puffs into the lungs 2 (two) times daily.  . Multiple Vitamin (MULTIVITAMIN WITH MINERALS) TABS tablet Take 1 tablet by mouth daily.  [name] NEXIUM 40 MG capsule TAKE 1 CAPSULE DAILY BEFORE BREAKFAST.  [name] ondansetron (ZOFRAN) 4 MG tablet Take 1 tablet (4 mg total) by mouth every 8 (eight) hours as needed for nausea or vomiting.  Vladimir Faster Glycol-Propyl Glycol (SYSTANE OP) Place 1 drop into both eyes 2 (two) times daily as needed (for dry eyes).   . promethazine (PHENERGAN) 25 MG tablet Take 1 tablet (25 mg total) by mouth every 8 (eight) hours as needed for nausea or vomiting.  . senna-docusate (SENOKOT-S) 8.6-50 MG tablet Take 2 tablets by mouth at bedtime.  . sertraline (ZOLOFT) 50 MG tablet Take 1 tablet (50 mg total) by mouth daily.  [name] tiotropium (SPIRIVA HANDIHALER) 18 MCG inhalation capsule Place 1 capsule (18 mcg total) into inhaler and inhale daily.  . traMADol (ULTRAM) 50 MG tablet Take 1 tablet every 6 hours as needed for moderate pain  . traZODone (DESYREL) 50 MG tablet TAKE 1/2  TO 1 TABLET AT BEDTIME AS NEEDED FOR REST.  [name] zolpidem (AMBIEN) 5 MG tablet TAKE 1 TABLET AT BEDTIME AS NEEDED.   No facility-administered encounter medications on file as of 08/13/2018.     Allergies as of 08/13/2018 - Review Complete 08/13/2018  Allergen Reaction Noted  . Tylenol [acetaminophen] Other (See Comments) 12/11/2015  . Symbicort [budesonide-formoterol fumarate] Other (See Comments) 10/04/2013    Past Medical History:  Diagnosis Date  . ANXIETY 07/23/2009   . Atherosclerosis of aorta ([place]) 07/24/2015  . Complication of anesthesia 7 or 8 yrs ago   woke up during colonscopy  . Coronary atherosclerosis 07/24/2015  . DEPRESSION 07/23/2009  . DIVERTICULOSIS, COLON 07/23/2009  . GERD 07/23/2009  . Headache(784.0) 07/23/2009   occasional  . Hemorrhoids   . HIP PAIN, LEFT 05/16/2010  . History of Crohn's disease   . HYPERLIPIDEMIA 07/23/2009  . HYPERTENSION 07/23/2009  . HYPOTHYROIDISM 07/23/2009  . IBS (irritable bowel syndrome)   . Insomnia   . LOW BACK PAIN, CHRONIC 10/01/2009  . PARESTHESIA 10/01/2009  . Shingles 2006   back    Past Surgical History:  Procedure Laterality Date  . ABDOMINAL HYSTERECTOMY  age 83 or 83  . APPENDECTOMY  yrs ago  . BILATERAL SALPINGOOPHORECTOMY  age 83 or 83  . COLONOSCOPY N/A 11/17/2013   Procedure: COLONOSCOPY;  Surgeon: Cleotis Nipper, MD;  Location: [facility];  Service: Endoscopy;  Laterality: N/A;  . ESOPHAGOGASTRODUODENOSCOPY N/A 11/17/2013   Procedure: ESOPHAGOGASTRODUODENOSCOPY (EGD);  Surgeon: Cleotis Nipper, MD;  Location: [facility];  Service: Endoscopy;  Laterality: N/A;  . HEMORRHOID SURGERY  yrs ago  . TONSILLECTOMY  yrs ago    Family History  Problem Relation Age of Onset  . Cancer Mother        Unknown type  . COPD Sister   . Cancer Sister        Breast  . Clotting disorder Neg Hx     Social History   Socioeconomic History  . Marital status: Widowed    Spouse name: Not on file  . Number of children: 1  . Years of education: Not on file  . Highest education level: Not on file  Occupational History  . Not on file  Social Needs  . Financial resource strain: Not hard at all  . Food insecurity:    Worry: Never true    Inability: Never true  . Transportation needs:    Medical: No    Non-medical: No  Tobacco Use  . Smoking status: Former Smoker    Packs/day: 0.50    Types: Cigarettes    Start date: 07/16/1960    Last attempt to quit: 07/16/1998    Years since quitting:  20.0  . Smokeless tobacco: Never Used  Substance and Sexual Activity  . Alcohol use: No    Alcohol/week: 0.0 standard drinks  . Drug use: No  . Sexual activity: Never  Lifestyle  . Physical activity:    Days per week: 0 days    Minutes per session: 0 min  . Stress: To some extent  Relationships  . Social connections:    Talks on phone: More than three times a week    Gets together: More than three times a week    Attends religious service: Never    Active member of club or organization: No    Attends meetings of clubs or organizations: Never    Relationship status: Widowed  . Intimate partner violence:    Fear  of current or ex partner: Not on file    Emotionally abused: Not on file    Physically abused: Not on file    Forced sexual activity: Not on file  Other Topics Concern  . Not on file  Social History Narrative   Widowed.  Lives alone in her own home.  Ambulates with a cane/walker when needed.    Review of systems: Review of Systems  Constitutional: Negative for fever and chills.  HENT: Negative.   Eyes: Negative for blurred vision.  Respiratory: Shortness of breath with exertion, very minimal cough Cardiovascular: No chest pain or chest discomfort Musculoskeletal: Negative for myalgias, back pain and joint pain.  All other systems reviewed and are negative.  Physical Exam:  Vitals:   08/13/18 1104  BP: 120/60  Pulse: 67  SpO2: 96%   Gen:      No acute distress, generally frail Neck:     No masses; no thyromegaly Lungs:    Good air entry bilaterally, no rales, no wheezing CV:         Regular rate and rhythm; no murmurs Ext:    No edema; adequate peripheral perfusion Psych: normal mood and affect  Data Reviewed: .  CT scan of the chest reviewed today, was reviewed with the patient during last visit  .  PET scan reviewed with the patient . [name] Recent hospital records relating to recent admissions reviewed   Assessment:   .  Abnormal CT scan of the  chest showing a 1.1 cm lesion  COPD  Emphysema  Some improvement in symptoms are present  Still quite frail  PFT reviewed with the patient showing mild obstructive disease with significant bronchodilator response  Plan/Recommendations:   .  Options of investigation discussed with the patient including bronchoscopy, surgical biopsy/excision-the risk with intervention remains high, very frail.  [name]  She will likely not tolerate surgical excision  .  Nodule is small, vascular bundle is close to this lesion as well-risk with biopsy will include bleeding, pneumothorax-we will follow  .  We agreed on repeating CT in 3 months  .  Continue inhaler use  .  I will see her back in the office in 2 months following repeat CT  .  Encouraged to call back with any significant symptoms   Sherrilyn Rist MD [facility] 08/13/2018, 11:32 AM  CC: Marrian Salvage,*

## 2018-08-16 ENCOUNTER — Other Ambulatory Visit: Payer: Self-pay | Admitting: Internal Medicine

## 2018-08-17 ENCOUNTER — Other Ambulatory Visit: Payer: Self-pay | Admitting: Family

## 2018-09-02 ENCOUNTER — Other Ambulatory Visit: Payer: Self-pay | Admitting: Internal Medicine

## 2018-09-02 ENCOUNTER — Other Ambulatory Visit: Payer: Self-pay | Admitting: Family

## 2018-09-03 NOTE — Telephone Encounter (Signed)
Pt aware to follow up with new PCP for further refills due to Dr.Kwiakowski's retiring.  

## 2018-09-06 ENCOUNTER — Other Ambulatory Visit: Payer: Self-pay | Admitting: Family

## 2018-09-23 ENCOUNTER — Telehealth: Payer: Self-pay | Admitting: Pulmonary Disease

## 2018-09-23 NOTE — Telephone Encounter (Signed)
Last ov note clearly states it's a CT not and not an MRI  Spoke with the pt and notified her of this  She verbalized understanding  I have scheduled her rov with [facility] for 10/21/18 at 2:30  Nothing further needed

## 2018-09-29 ENCOUNTER — Other Ambulatory Visit: Payer: Self-pay | Admitting: Family

## 2018-10-04 ENCOUNTER — Other Ambulatory Visit: Payer: Self-pay | Admitting: Family

## 2018-10-18 ENCOUNTER — Ambulatory Visit (INDEPENDENT_AMBULATORY_CARE_PROVIDER_SITE_OTHER)
Admission: RE | Admit: 2018-10-18 | Discharge: 2018-10-18 | Disposition: A | Discharge status: Home / Self Care | Payer: Medicare Other | Source: Ambulatory Visit | Attending: Pulmonary Disease | Admitting: Pulmonary Disease

## 2018-10-18 DIAGNOSIS — J439 Emphysema, unspecified: Secondary | ICD-10-CM | POA: Diagnosis not present

## 2018-10-18 DIAGNOSIS — R911 Solitary pulmonary nodule: Secondary | ICD-10-CM | POA: Diagnosis not present

## 2018-10-20 ENCOUNTER — Other Ambulatory Visit: Payer: Self-pay | Admitting: Family

## 2018-10-21 ENCOUNTER — Other Ambulatory Visit: Payer: Self-pay | Admitting: Family

## 2018-10-21 ENCOUNTER — Ambulatory Visit (INDEPENDENT_AMBULATORY_CARE_PROVIDER_SITE_OTHER): Payer: Medicare Other | Admitting: Pulmonary Disease

## 2018-10-21 ENCOUNTER — Encounter: Payer: Self-pay | Admitting: Pulmonary Disease

## 2018-10-21 ENCOUNTER — Telehealth: Payer: Self-pay | Admitting: Family

## 2018-10-21 VITALS — BP 124/64 | HR 84 | Ht <= 58 in | Wt 107.0 lb

## 2018-10-21 DIAGNOSIS — R9389 Abnormal findings on diagnostic imaging of other specified body structures: Secondary | ICD-10-CM

## 2018-10-21 MED ORDER — HYDROCODONE-ACETAMINOPHEN 5-325 MG PO TABS: ORAL_TABLET | ORAL | 0 refills | Status: DC

## 2018-10-21 NOTE — Telephone Encounter (Signed)
Copied from [place] [id number]. Topic: Quick Communication - See Telephone Encounter >> Oct 21, 2018 10:06 AM Conception Chancy, NT wrote: CRM for notification. See Telephone encounter for: 10/21/18.  Patient is calling and requesting a refill on HYDROcodone-acetaminophen (NORCO/VICODIN) 5-325 MG tablet. She states she comes in at 2:30 for Pulmonary today 10/21/18 and wants to get it today. I informed patient of the prescription request 24-72 hr. She states she has done this in the past. Please advise.

## 2018-10-21 NOTE — Telephone Encounter (Signed)
I sent #20 to the pharmacy for her;

## 2018-10-21 NOTE — Patient Instructions (Signed)
Right lower lobe nodule-did increase in size from previous Mild hypermetabolic activity in the nodule on recent PET  Likely cancerous process Risk of decompensation is too high for any invasive intervention  We will repeat CT in 6 months We will provide you with a pneumonia vaccine today  I will see you back in the office in about 6 months

## 2018-10-21 NOTE — Progress Notes (Signed)
Heather [name]    [id number]    10-Nov-1935  Primary Care Physician:Murray, Marvis Repress, FNP  Referring Physician: Marrian Salvage, [place] [address], [place] [address]  Chief complaint:   Patient being seen in the office for a lung nodule, abnormal CT scan of the chest/PET scan Symptoms have been better with inhaler use Still quite frail  HPI:  Elderly lady with a history of chronic obstructive pulmonary disease-controlled on inhalers Has had 3 recent hospitalization 1 for her breathing, 2 other admissions relating to colitis She is quite frail-she feels better today  She is still recovering, still very weak Able to get around the house but takes frequent breaks  She had had a CT scan of the chest in February showing a 1 cm opacity in the right lower lobe-could be related to mucous plugging Repeat CT in July 2019 did reveal a 1.1 cm opacity-this was followed up with a PET scan with an SUV of 2.7-we did look at the CT together during the last visit We discussed options of intervention which at present is very risky as she is quite frail We agreed on repeating CT after 3 months to see if there is any significant evolution-the repeat CT does reveal increase in the size of the nodule  Occupation: No significant occupational history Exposures: No exposure to mold Smoking history: Reformed smoker Travel history: No Significant travel  Outpatient Encounter Medications as of 10/21/2018  Medication Sig  . Albuterol Sulfate (PROAIR RESPICLICK) 341 (90 Base) MCG/ACT AEPB Inhale 2 puffs into the lungs 4 (four) times daily as needed. (Patient taking differently: Inhale 2 puffs into the lungs 4 (four) times daily as needed (sob and wheezing). )  . benzonatate (TESSALON) 100 MG capsule TAKE 1 CAPSULE TWICE DAILY AS NEEDED FOR COUGH.  . clorazepate (TRANXENE) 15 MG tablet TAKE 1/2 TABLET TWICE DAILY AS NEEDED FOR ANXIETY.  . Dextromethorphan-guaiFENesin  ([place] FAST-MAX DM MAX) 5-100 MG/5ML LIQD Take 15 mLs by mouth every 4 (four) hours as needed (chest congestion).  [name] diltiazem (CARDIZEM CD) 240 MG 24 hr capsule TAKE (1) CAPSULE DAILY.  . folic acid (FOLVITE) 1 MG tablet Take 1 tablet (1 mg total) by mouth daily.  [name] HYDROcodone-acetaminophen (NORCO/VICODIN) 5-325 MG tablet Take 1/2 tablet by mouth twice daily as needed for pain.  [name] levothyroxine (SYNTHROID, LEVOTHROID) 25 MCG tablet Take 1 tablet (25 mcg total) by mouth daily before breakfast.  . mometasone-formoterol (DULERA) 200-5 MCG/ACT AERO Inhale 2 puffs into the lungs 2 (two) times daily.  . Multiple Vitamin (MULTIVITAMIN WITH MINERALS) TABS tablet Take 1 tablet by mouth daily.  [name] NEXIUM 40 MG capsule TAKE 1 CAPSULE DAILY BEFORE BREAKFAST.  [name] ondansetron (ZOFRAN) 4 MG tablet Take 1 tablet (4 mg total) by mouth every 8 (eight) hours as needed for nausea or vomiting.  Vladimir Faster Glycol-Propyl Glycol (SYSTANE OP) Place 1 drop into both eyes 2 (two) times daily as needed (for dry eyes).   . promethazine (PHENERGAN) 25 MG tablet Take 1 tablet (25 mg total) by mouth every 8 (eight) hours as needed for nausea or vomiting.  . senna-docusate (SENOKOT-S) 8.6-50 MG tablet Take 2 tablets by mouth at bedtime.  . sertraline (ZOLOFT) 50 MG tablet Take 1 tablet (50 mg total) by mouth daily.  [name] tiotropium (SPIRIVA HANDIHALER) 18 MCG inhalation capsule Place 1 capsule (18 mcg total) into inhaler and inhale daily.  . traMADol (ULTRAM) 50 MG tablet TAKE  ONE TABLET EVERY 6 HOURS AS NEEDED FOR MODERATE PAIN.  . traZODone (DESYREL) 50 MG tablet TAKE 1/2 TO 1 TABLET AT BEDTIME AS NEEDED FOR REST.  [name] zolpidem (AMBIEN) 5 MG tablet TAKE 1 TABLET AT BEDTIME AS NEEDED.  . [DISCONTINUED] promethazine (PHENERGAN) 25 MG tablet TAKE 1 TABLET EVERY 8 HOURS AS NEEDED FOR NAUSEA & VOMITING.  . [DISCONTINUED] benzonatate (TESSALON) 100 MG capsule TAKE 1 CAPSULE TWICE DAILY AS NEEDED FOR COUGH.  . [DISCONTINUED]  Cholecalciferol 2000 UNITS TABS Take 2,000 Units by mouth daily.  . [DISCONTINUED] HYDROcodone-acetaminophen (NORCO/VICODIN) 5-325 MG tablet Take 1/2 tablet by mouth twice daily as needed for pain.  . [DISCONTINUED] promethazine (PHENERGAN) 25 MG tablet TAKE 1 TABLET EVERY 8 HOURS AS NEEDED FOR NAUSEA & VOMITING.   No facility-administered encounter medications on file as of 10/21/2018.     Allergies as of 10/21/2018 - Review Complete 10/21/2018  Allergen Reaction Noted  . Tylenol [acetaminophen] Other (See Comments) 12/11/2015  . Symbicort [budesonide-formoterol fumarate] Other (See Comments) 10/04/2013    Past Medical History:  Diagnosis Date  . ANXIETY 07/23/2009  . Atherosclerosis of aorta ([place]) 07/24/2015  . Complication of anesthesia 7 or 8 yrs ago   woke up during colonscopy  . Coronary atherosclerosis 07/24/2015  . DEPRESSION 07/23/2009  . DIVERTICULOSIS, COLON 07/23/2009  . GERD 07/23/2009  . Headache(784.0) 07/23/2009   occasional  . Hemorrhoids   . HIP PAIN, LEFT 05/16/2010  . History of Crohn's disease   . HYPERLIPIDEMIA 07/23/2009  . HYPERTENSION 07/23/2009  . HYPOTHYROIDISM 07/23/2009  . IBS (irritable bowel syndrome)   . Insomnia   . LOW BACK PAIN, CHRONIC 10/01/2009  . PARESTHESIA 10/01/2009  . Shingles 2006   back    Past Surgical History:  Procedure Laterality Date  . ABDOMINAL HYSTERECTOMY  age 63 or 4  . APPENDECTOMY  yrs ago  . BILATERAL SALPINGOOPHORECTOMY  age 37 or 54  . COLONOSCOPY N/A 11/17/2013   Procedure: COLONOSCOPY;  Surgeon: Cleotis Nipper, MD;  Location: [facility];  Service: Endoscopy;  Laterality: N/A;  . ESOPHAGOGASTRODUODENOSCOPY N/A 11/17/2013   Procedure: ESOPHAGOGASTRODUODENOSCOPY (EGD);  Surgeon: Cleotis Nipper, MD;  Location: [facility];  Service: Endoscopy;  Laterality: N/A;  . HEMORRHOID SURGERY  yrs ago  . TONSILLECTOMY  yrs ago    Family History  Problem Relation Age of Onset  . Cancer Mother        Unknown type  .  COPD Sister   . Cancer Sister        Breast  . Clotting disorder Neg Hx     Social History   Socioeconomic History  . Marital status: Widowed    Spouse name: Not on file  . Number of children: 1  . Years of education: Not on file  . Highest education level: Not on file  Occupational History  . Not on file  Social Needs  . Financial resource strain: Not hard at all  . Food insecurity:    Worry: Never true    Inability: Never true  . Transportation needs:    Medical: No    Non-medical: No  Tobacco Use  . Smoking status: Former Smoker    Packs/day: 0.50    Types: Cigarettes    Start date: 07/16/1960    Last attempt to quit: 07/16/1998    Years since quitting: 20.2  . Smokeless tobacco: Never Used  Substance and Sexual Activity  . Alcohol use: No    Alcohol/week: 0.0 standard drinks  .  Drug use: No  . Sexual activity: Never  Lifestyle  . Physical activity:    Days per week: 0 days    Minutes per session: 0 min  . Stress: To some extent  Relationships  . Social connections:    Talks on phone: More than three times a week    Gets together: More than three times a week    Attends religious service: Never    Active member of club or organization: No    Attends meetings of clubs or organizations: Never    Relationship status: Widowed  . Intimate partner violence:    Fear of current or ex partner: Not on file    Emotionally abused: Not on file    Physically abused: Not on file    Forced sexual activity: Not on file  Other Topics Concern  . Not on file  Social History Narrative   Widowed.  Lives alone in her own home.  Ambulates with a cane/walker when needed.    Review of systems: Constitutional: No fever or chills HENT: No nasal stuffiness or congestion Eyes: No visual changes Respiratory: Mild cough with shortness of breath with exertion Cardiovascular: No chest pains or discomfort All other systems reviewed and are negative.  Physical Exam:  Vitals:    10/21/18 1447  BP: 124/64  Pulse: 84  SpO2: 94%   Gen:   Frail elderly lady Neck:     no thyromegaly, no adenopathy Lungs:    No wheezes, no rales, fair air entry CV:         S1-S2 appreciated Ext:    No edema; adequate peripheral perfusion Psych: normal mood and affect  Data Reviewed: .  CT scan of the chest reviewed today with the patient and her son Lung nodule has increased to 1.5 cm  .  PET scan reviewed with the patient . [name] Recent hospital records relating to recent admissions reviewed   Assessment:   .  Abnormal CT scan of the chest showing a 1.5 cm lesion -This is likely neoplastic process, patient is aware of this -Risk of intervention remains very high as patient remains very frail and she is 83 years old -Risk of doing more harm than good is very significant  COPD  Emphysema  Obstructive lung disease on PFT with significant bronchodilator response  Plan/Recommendations:  .  We did go over options of intervention, balance and intervention with the risk of decompensation .   Risk of decompensation is still quite high  .  She will likely not tolerate surgical excision  .  We agreed on repeating CT in 6 months  .  Continue inhaler use  I will see her back following CT scan, she wants to come in following the winter months   Sherrilyn Rist MD [facility] 10/21/2018, 4:39 PM  CC: Marrian Salvage,*

## 2018-10-21 NOTE — Telephone Encounter (Signed)
Spoke with patient today and info given. 

## 2018-10-22 ENCOUNTER — Telehealth: Payer: Self-pay | Admitting: Pulmonary Disease

## 2018-10-22 NOTE — Telephone Encounter (Signed)
Spoke with pt. She is aware of Brian's response. Nothing further was needed.

## 2018-10-22 NOTE — Telephone Encounter (Signed)
Spoke with pt, she states her arm hurts really bad and feels really hot. She states she never had this pain like this after an injection before. She did state she took a pain pill to help with the pain but wanted to call and let us know. I did explain that this happens sometimes but she wanted to let us know. Any recommendations? Please advise DOD since Dr. Jenetta Downer is not here.

## 2018-10-22 NOTE — Telephone Encounter (Signed)
Sorry to hear the patient is having the symptoms.  Arm soreness is not uncommon when receiving IM injections.  She can take 600mg  of ibuprofen every 8 hours for 1-2 days.  Try to move the arm the best she can.  If symptoms worsen she can present to our office for further evaluation or she can present to an emergency room for further evaluation.  Will route to Dr. Ander Slade to make sure he is aware.   Wyn Quaker FNP

## 2018-10-28 ENCOUNTER — Encounter: Payer: Self-pay | Admitting: Radiation Oncology

## 2018-10-28 ENCOUNTER — Telehealth: Payer: Self-pay | Admitting: Pulmonary Disease

## 2018-10-28 ENCOUNTER — Other Ambulatory Visit: Payer: Self-pay | Admitting: Pulmonary Disease

## 2018-10-28 DIAGNOSIS — R911 Solitary pulmonary nodule: Secondary | ICD-10-CM

## 2018-10-28 MED ORDER — PREDNISONE 10 MG PO TABS: 10.0000 mg | ORAL_TABLET | Freq: Every day | ORAL | 0 refills | Status: DC

## 2018-10-28 MED ORDER — AMOXICILLIN-POT CLAVULANATE 875-125 MG PO TABS: 1.0000 | ORAL_TABLET | Freq: Two times a day (BID) | ORAL | 0 refills | Status: DC

## 2018-10-28 NOTE — Telephone Encounter (Signed)
Augmentin 875 p.o. twice daily for 7 days Prednisone 10 p.o. twice daily for 7 days Mucinex 600 p.o. twice daily for 7 days  May take a couple of days to start feeling better even with the above regimen  Call back with significant concerns

## 2018-10-28 NOTE — Progress Notes (Signed)
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  Given the patient's comorbidities, a biopsy could be high risk.  The patient also is very focused on quality of life, and does not want to be overly aggressive in her work-up.  The tumor is in a very good location for possible stereotactic body radiation treatment.  We would be happy to discuss this with the patient as a possibility, and pulmonary medicine will further discuss this option with her and make a referral if the patient is interested in potentially pursuing this.

## 2018-10-28 NOTE — Telephone Encounter (Signed)
  Schedule to see Dr. Salomon Fick oncologist  I did update the patient that we are going to do this She is feeling unwell at present If we can get an appointment in about 2 weeks will be good for her  Appointment is regarding a right lung nodule that is increasing in size

## 2018-10-28 NOTE — Telephone Encounter (Signed)
Spoke with pt. States that she is not feeling well. Reports increased chest congestion, shortness of breath, back pain and fatigue. She is not able to cough up any congestion. Denies chest tightness, wheezing, fever/chills/sweats. Stated, "I feel like I am coming down with the flu." Pt would like to have something sent to her pharmacy.  Dr. Ander Slade - please advise. Thanks.

## 2018-10-28 NOTE — Telephone Encounter (Signed)
I have placed this order nothing further needed at this time. 

## 2018-10-28 NOTE — Telephone Encounter (Signed)
Spoke with pt. She is aware of Dr. Judson Roch recommendations. Rxs have been sent in. Nothing further was needed.

## 2018-10-31 ENCOUNTER — Other Ambulatory Visit: Payer: Self-pay | Admitting: Family

## 2018-11-15 NOTE — Progress Notes (Signed)
Thoracic Location of Tumor / Histology: Right lower lobe  Patient presented with multiple ER visits with various medical issues, spot noted on CT scan.  CT Chest 10/18/2018: Enlarging right lower lobe pulmonary nodule now measures 15 x 13 mm compared to 11 x 11 mm on prior chest CT.  No new pulmonary nodules.  CT Chest 06/11/2018: right lower lobe nodule which currently measures 11 x 11 mm.  A few other scattered 2-3 mm pulmonary nodules are noted in lungs bilaterally, unchanged.  CTA Chest 01/28/2018: 1 cm opacity in the right lower lobe.  Biopsies of   Tobacco/Marijuana/Snuff/ETOH use: Former smoker.  Past/Anticpated interventions by Pulmonary, if any: Dr. Ander Slade 10/21/2018 -We did go over options of intervention, balance and intervention with the risk of decompensation. -She will likely not tolerate surgical excision. -We agreed on repeating CT in 6 months. -Continue inhaler use. -I will see her back following CT scan, she wants to come in following the winter months. -referral to Dr. Lisbeth Renshaw for enlarging right lung nodule.  Past/Anticipated interventions by cardiothoracic surgery, if any:   Past/Anticipated interventions by medical oncology, if any:   Signs/Symptoms  Weight changes, if any: Fluctuates daily.  Respiratory complaints, if any: Occasional SOB at any time, checks oxygen level daily, running 97-98%.  Hemoptysis, if any: Occasional productive cough.  Started a couple days ago, yellow in color.  No blood noted.  Pain issues, if any:  Pain in bilateral ankles.  BP (!) 149/59 (BP Location: Left Arm, Patient Position: Sitting)   Pulse 65   Temp 98.2 F (36.8 C) (Oral)   Resp 20   Ht 4\' 10"  (1.473 m)   Wt 108 lb 3.2 oz (49.1 kg)   SpO2 97%   BMI 22.61 kg/m    Wt Readings from Last 3 Encounters:  11/16/18 108 lb 3.2 oz (49.1 kg)  10/21/18 107 lb (48.5 kg)  08/13/18 98 lb (44.5 kg)    SAFETY ISSUES:  Prior radiation? No  Pacemaker/ICD? No  Possible  current pregnancy? Abdominal hysterectomy  Is the patient on methotrexate? No  Current Complaints / other details:

## 2018-11-16 ENCOUNTER — Ambulatory Visit
Admission: RE | Admit: 2018-11-16 | Discharge: 2018-11-16 | Disposition: A | Discharge status: Home / Self Care | Payer: Medicare Other | Source: Ambulatory Visit | Attending: Radiation Oncology | Admitting: Radiation Oncology

## 2018-11-16 ENCOUNTER — Encounter: Payer: Self-pay | Admitting: Radiation Oncology

## 2018-11-16 ENCOUNTER — Other Ambulatory Visit: Payer: Self-pay

## 2018-11-16 VITALS — BP 149/59 | HR 65 | Temp 98.2°F | Resp 20 | Ht <= 58 in | Wt 108.2 lb

## 2018-11-16 DIAGNOSIS — Z79899 Other long term (current) drug therapy: Secondary | ICD-10-CM | POA: Diagnosis not present

## 2018-11-16 DIAGNOSIS — C3431 Malignant neoplasm of lower lobe, right bronchus or lung: Secondary | ICD-10-CM

## 2018-11-16 DIAGNOSIS — Z87891 Personal history of nicotine dependence: Secondary | ICD-10-CM | POA: Insufficient documentation

## 2018-11-16 DIAGNOSIS — Z79890 Hormone replacement therapy (postmenopausal): Secondary | ICD-10-CM | POA: Diagnosis not present

## 2018-11-16 DIAGNOSIS — J449 Chronic obstructive pulmonary disease, unspecified: Secondary | ICD-10-CM | POA: Insufficient documentation

## 2018-11-16 DIAGNOSIS — R3989 Other symptoms and signs involving the genitourinary system: Secondary | ICD-10-CM | POA: Diagnosis not present

## 2018-11-16 DIAGNOSIS — R918 Other nonspecific abnormal finding of lung field: Secondary | ICD-10-CM | POA: Diagnosis not present

## 2018-11-16 HISTORY — DX: Chronic obstructive pulmonary disease, unspecified: J44.9

## 2018-11-16 NOTE — Progress Notes (Signed)
Radiation Oncology         [phone] ________________________________  Name: Heather [name]        MRN: [id number]  Date of Service: 11/16/2018 DOB: [birth year]  EQ:ASTMHD, Marvis Repress, FNP  Laurin Coder, MD     REFERRING PHYSICIAN: Laurin Coder, MD   DIAGNOSIS: The primary encounter diagnosis was Primary malignant neoplasm of right lower lobe of lung ([place]). A diagnosis of Malignant neoplasm of lower lobe of right lung [facility]) was also pertinent to this visit.   HISTORY OF PRESENT ILLNESS: Heather [name] is a 83 y.o. female seen at the request of Dr. Ander Slade for a probable stage I lung cancer. The paitent has a history of COPD controlled on 3 inhalers and who had a CT in February 2019 that revealed a 1 cm mass in the right lower lobe.  This has been followed on subsequent scans as it was originally felt to be mucus plugging, however on 06/11/2018 this was seen again measuring 1.1 x 1.1 cm.  No evidence of adenopathy was identified.  She was counseled on the rationale to proceed with pet imaging which she underwent on 06/28/2018.  The SUV of the lesion was 2.77, with a blood pool of 2.23.  The lesion did measure 12 mm.  No additional evidence of adenopathy was identified.  She was counseled on repeat imaging and underwent this on 10/18/2018.  The lesion now measures 15 x 13 mm in comparison to her prior imaging.  No adenopathy is identified.  She is against moving forward with aggressive work-up, and would not be a good surgical candidate.  She comes today to discuss options of putative treatment with stereotactic body radiotherapy.  PREVIOUS RADIATION THERAPY: No   PAST MEDICAL HISTORY:  Past Medical History:  Diagnosis Date  . ANXIETY 07/23/2009  . Atherosclerosis of aorta ([place]) 07/24/2015  . Complication of anesthesia 7 or 8 yrs ago   woke up during colonscopy  . COPD (chronic obstructive pulmonary disease) ([place])   . Coronary atherosclerosis 07/24/2015  . DEPRESSION  07/23/2009  . DIVERTICULOSIS, COLON 07/23/2009  . GERD 07/23/2009  . Headache(784.0) 07/23/2009   occasional  . Hemorrhoids   . HIP PAIN, LEFT 05/16/2010  . History of Crohn's disease   . HYPERLIPIDEMIA 07/23/2009  . HYPERTENSION 07/23/2009  . HYPOTHYROIDISM 07/23/2009  . IBS (irritable bowel syndrome)   . Insomnia   . LOW BACK PAIN, CHRONIC 10/01/2009  . PARESTHESIA 10/01/2009  . Shingles 2006   back       PAST SURGICAL HISTORY: Past Surgical History:  Procedure Laterality Date  . ABDOMINAL HYSTERECTOMY  age 85 or 47  . APPENDECTOMY  yrs ago  . BILATERAL SALPINGOOPHORECTOMY  age 80 or 61  . COLONOSCOPY N/A 11/17/2013   Procedure: COLONOSCOPY;  Surgeon: Cleotis Nipper, MD;  Location: [facility];  Service: Endoscopy;  Laterality: N/A;  . ESOPHAGOGASTRODUODENOSCOPY N/A 11/17/2013   Procedure: ESOPHAGOGASTRODUODENOSCOPY (EGD);  Surgeon: Cleotis Nipper, MD;  Location: [facility];  Service: Endoscopy;  Laterality: N/A;  . HEMORRHOID SURGERY  yrs ago  . TONSILLECTOMY  yrs ago     FAMILY HISTORY:  Family History  Problem Relation Age of Onset  . Cancer Mother        Unknown type  . Cancer Sister        Breast  . Clotting disorder Neg Hx      SOCIAL HISTORY:  reports that she quit smoking about 20 years ago. Her smoking use  included cigarettes. She started smoking about 58 years ago. She smoked 0.50 packs per day. She has never used smokeless tobacco. She reports that she does not drink alcohol or use drugs.   ALLERGIES: Tylenol [acetaminophen] and Symbicort [budesonide-formoterol fumarate]   MEDICATIONS:  Current Outpatient Medications  Medication Sig Dispense Refill  . Albuterol Sulfate (PROAIR RESPICLICK) 397 (90 Base) MCG/ACT AEPB Inhale 2 puffs into the lungs 4 (four) times daily as needed. (Patient taking differently: Inhale 2 puffs into the lungs 4 (four) times daily as needed (sob and wheezing). ) 1 each 6  . benzonatate (TESSALON) 100 MG capsule TAKE 1 CAPSULE  TWICE DAILY AS NEEDED FOR COUGH. 20 capsule 0  . benzonatate (TESSALON) 100 MG capsule TAKE 1 CAPSULE TWICE DAILY AS NEEDED FOR COUGH. 20 capsule 0  . clorazepate (TRANXENE) 15 MG tablet TAKE 1/2 TABLET TWICE DAILY AS NEEDED FOR ANXIETY. 30 tablet 0  . Dextromethorphan-guaiFENesin ([place] FAST-MAX DM MAX) 5-100 MG/5ML LIQD Take 15 mLs by mouth every 4 (four) hours as needed (chest congestion).    [name] diltiazem (CARDIZEM CD) 240 MG 24 hr capsule TAKE (1) CAPSULE DAILY. 90 capsule 2  . folic acid (FOLVITE) 1 MG tablet Take 1 tablet (1 mg total) by mouth daily. 90 tablet 1  . HYDROcodone-acetaminophen (NORCO/VICODIN) 5-325 MG tablet Take 1/2 tablet by mouth twice daily as needed for pain. 20 tablet 0  . levothyroxine (SYNTHROID, LEVOTHROID) 25 MCG tablet Take 1 tablet (25 mcg total) by mouth daily before breakfast. 90 tablet 2  . mometasone-formoterol (DULERA) 200-5 MCG/ACT AERO Inhale 2 puffs into the lungs 2 (two) times daily. 1 Inhaler 3  . Multiple Vitamin (MULTIVITAMIN WITH MINERALS) TABS tablet Take 1 tablet by mouth daily. 30 tablet 1  . NEXIUM 40 MG capsule TAKE 1 CAPSULE DAILY BEFORE BREAKFAST. 90 capsule 0  . ondansetron (ZOFRAN) 4 MG tablet Take 1 tablet (4 mg total) by mouth every 8 (eight) hours as needed for nausea or vomiting. 20 tablet 0  . Polyethyl Glycol-Propyl Glycol (SYSTANE OP) Place 1 drop into both eyes 2 (two) times daily as needed (for dry eyes).     . promethazine (PHENERGAN) 25 MG tablet Take 1 tablet (25 mg total) by mouth every 8 (eight) hours as needed for nausea or vomiting. 20 tablet 0  . senna-docusate (SENOKOT-S) 8.6-50 MG tablet Take 2 tablets by mouth at bedtime. 60 tablet 2  . sertraline (ZOLOFT) 50 MG tablet Take 1 tablet (50 mg total) by mouth daily. 90 tablet 3  . tiotropium (SPIRIVA HANDIHALER) 18 MCG inhalation capsule Place 1 capsule (18 mcg total) into inhaler and inhale daily. 90 capsule 3  . traMADol (ULTRAM) 50 MG tablet TAKE ONE TABLET EVERY 6 HOURS AS  NEEDED FOR MODERATE PAIN. 60 tablet 0  . traZODone (DESYREL) 50 MG tablet TAKE 1/2 TO 1 TABLET AT BEDTIME AS NEEDED FOR REST. 90 tablet 0  . zolpidem (AMBIEN) 5 MG tablet TAKE 1 TABLET AT BEDTIME AS NEEDED. 30 tablet 0   No current facility-administered medications for this encounter.      REVIEW OF SYSTEMS: On review of systems, the patient reports that she is doing well overall. She does state though she's had a history in the past of needing to self catheterize. In the last week or two she's noticed a few episodes of abdominal fullness, cramping, and passing less urine. She has not mentioned this to her PCP. She denies any chest pain, shortness of breath, cough, fevers, chills, night sweats,  unintended weight changes. She denies any bowel or bladder disturbances, and denies abdominal pain, nausea or vomiting. She denies any new musculoskeletal or joint aches or pains. A complete review of systems is obtained and is otherwise negative.     PHYSICAL EXAM:  Wt Readings from Last 3 Encounters:  11/16/18 108 lb 3.2 oz (49.1 kg)  10/21/18 107 lb (48.5 kg)  08/13/18 98 lb (44.5 kg)   Temp Readings from Last 3 Encounters:  11/16/18 98.2 F (36.8 C) (Oral)  05/31/18 98.4 F (36.9 C) (Oral)  04/30/18 98.3 F (36.8 C) (Oral)   BP Readings from Last 3 Encounters:  11/16/18 (!) 149/59  10/21/18 124/64  08/13/18 120/60   Pulse Readings from Last 3 Encounters:  11/16/18 65  10/21/18 84  08/13/18 67   Pain Assessment Pain Score: 5 (Bilateral ankle pain, bone pain)/10  In general this is a well appearing caucasian female in no acute distress. She is alert and oriented x4 and appropriate throughout the examination. HEENT reveals that the patient is normocephalic, atraumatic. EOMs are intact. Skin is intact without any evidence of gross lesions. Cardiovascular exam reveals a regular rate and rhythm, no clicks rubs or murmurs are auscultated. Chest is clear to auscultation bilaterally.  Lymphatic assessment is performed and does not reveal any adenopathy in the cervical, supraclavicular, axillary, or inguinal chains. Abdomen has active bowel sounds in [name] quadrants and is intact. The abdomen is soft, mildly tender at the umbilicus, mildly distended.    ECOG = 0  0 - Asymptomatic (Fully active, able to carry on [name] predisease activities without restriction)  1 - Symptomatic but completely ambulatory (Restricted in physically strenuous activity but ambulatory and able to carry out work of a light or sedentary nature. For example, light housework, office work)  2 - Symptomatic, <50% in bed during the day (Ambulatory and capable of [name] self care but unable to carry out any work activities. Up and about more than 50% of waking hours)  3 - Symptomatic, >50% in bed, but not bedbound (Capable of only limited self-care, confined to bed or chair 50% or more of waking hours)  4 - Bedbound (Completely disabled. Cannot carry on any self-care. Totally confined to bed or chair)  5 - Death   Eustace Pen MM, Creech RH, Tormey DC, et al. [phone]). "Toxicity and response criteria of the [facility]". [place] Oncol. 5 (6): 649-55    LABORATORY DATA:  Lab Results  Component Value Date   WBC 10.8 (H) 05/31/2018   HGB 11.7 (L) 05/31/2018   HCT 35.4 (L) 05/31/2018   MCV 86.8 05/31/2018   PLT 439.0 (H) 05/31/2018   Lab Results  Component Value Date   NA 138 05/31/2018   K 3.9 05/31/2018   CL 102 05/31/2018   CO2 26 05/31/2018   Lab Results  Component Value Date   ALT 11 05/31/2018   AST 15 05/31/2018   ALKPHOS 87 05/31/2018   BILITOT 0.4 05/31/2018      RADIOGRAPHY: Ct Chest Wo Contrast  Result Date: 10/18/2018 CLINICAL DATA:  Follow-up pulmonary nodule EXAM: CT CHEST WITHOUT CONTRAST TECHNIQUE: Multidetector CT imaging of the chest was performed following the standard protocol without IV contrast. COMPARISON:  06/11/2018.  PET CT 06/28/2018 FINDINGS:  Cardiovascular: Moderate coronary artery and aortic calcifications. No evidence of aortic aneurysm. Heart is normal size. Mediastinum/Nodes: Scattered small mediastinal lymph nodes, none pathologically enlarged. No mediastinal, hilar, or axillary adenopathy. Lungs/Pleura: Mild to moderate emphysema. Enlarging  right lower lobe pulmonary nodule now measures 15 x 13 mm compared to 11 x 11 mm on prior chest CT. No new pulmonary nodules. No effusions. Upper Abdomen: Imaging into the upper abdomen shows no acute findings. Musculoskeletal: Chest wall soft tissues are unremarkable. No acute bony abnormality. IMPRESSION: Enlarging right lower lobe pulmonary nodule, 15 x 13 mm currently compared to 11 x 11 mm previously concerning for primary lung cancer. Coronary artery disease. Aortic Atherosclerosis (ICD10-I70.0) and Emphysema (ICD10-J43.9). Electronically Signed   By: Rolm Baptise M.D.   On: 10/18/2018 10:54       IMPRESSION/PLAN: 1. Putative Stage IA2, cT1bN0M0, NSCLC of the right lower lobe. Dr. Lisbeth Renshaw discusses the pathology findings and reviews the nature of early stage lung cancer and the rationale to consider stereotactic body ratiotherapy (SBRT). Dr. Lisbeth Renshaw discusses the limitations in treatment without definitive biopsy but do proceed with biopsy is felt to be higher risk than the benefit as she's had multiple data points to support the change in characteristics over time. We discussed the risks, benefits, short, and long term effects of radiotherapy, and the patient is interested in proceeding. Dr. Lisbeth Renshaw discusses the delivery and logistics of radiotherapy and anticipates a course of 3-5 fractions of radiotherapy, though he anticipates 3. She would like to think about her options prior to proceeding with signing consent. We will revisit this at her simulation appointment.  2. COPD. She will continue her current regimen and see Dr. Ander Slade for follow up. 3. Urinary symptoms. I encouraged the patient to follow  up with her PCP since she declined referral to urology.  In a visit lasting 60 minutes, greater than 50% of the time was spent face to face discussing her case, and coordinating the patient's care.  The above documentation reflects my direct findings during this shared patient visit. Please see the separate note by Dr. Lisbeth Renshaw on this date for the remainder of the patient's plan of care.    Carola Rhine, PAC

## 2018-11-17 ENCOUNTER — Other Ambulatory Visit: Payer: Self-pay | Admitting: Family

## 2018-11-18 ENCOUNTER — Encounter: Payer: Self-pay | Admitting: General Practice

## 2018-11-18 ENCOUNTER — Telehealth: Payer: Self-pay | Admitting: General Practice

## 2018-11-18 NOTE — Telephone Encounter (Signed)
[place] CSW Progress Notes  Lives alone, has help from adult son who lives in [place].  Staes her primary needs are for "someone to help w the vacuuming and change the linens, I just cant do that any more." As these are not services covered by insurance, CSW will refer to [employer] via [facility] for assessment and referral to appropriate community resources.  Patient advised that this kind of help would be private pay arrangement.  Patient would like help ASAP as she is concerned about impact of upcoming radiation treatments and their impact on her fatigue level.  Referral made.    Edwyna Shell, LCSW Clinical Social Worker Phone:  [phone]

## 2018-11-18 NOTE — Progress Notes (Signed)
[place] Psychosocial Distress Screening Clinical Social Work  Clinical Social Work was referred by distress screening protocol.  The patient scored a 8 on the Psychosocial Distress Thermometer which indicates moderate distress. Clinical Social Worker contacted patient by phone to assess for distress and other psychosocial needs. Called patient, no answer and unable to leave message/no VM.  As concerns are primarily physical in nature, CSW will await direction from medical team and respond to consults as needed.    ONCBCN DISTRESS SCREENING 11/16/2018  Screening Type Initial Screening  Distress experienced in past week (1-10) 8  Emotional problem type Nervousness/Anxiety;Adjusting to illness;Adjusting to appearance changes  Information Concerns Type Lack of info about diagnosis  Physical Problem type Pain;Nausea/vomiting;Sleep/insomnia;Getting around;Breathing;Mouth sores/swallowing;Constipation/diarrhea;Changes in urination;Tingling hands/feet  Other Reached by phone [phone]    Clinical Social Worker follow up needed: No.  If yes, follow up plan:  Beverely Pace, [place], LCSW Clinical Social Worker Phone:  [phone]

## 2018-11-19 ENCOUNTER — Telehealth: Payer: Self-pay | Admitting: *Deleted

## 2018-11-19 NOTE — Telephone Encounter (Signed)
Received a voicemail from Mrs. Heather [name] stating that she is ready to start radiation treatments.  MD and PA made aware.  Will continue to follow as necessary.  [name]. [name], BSN

## 2018-11-22 ENCOUNTER — Telehealth: Payer: Self-pay | Admitting: Family

## 2018-11-22 MED ORDER — FLUTICASONE-UMECLIDIN-VILANT 100-62.5-25 MCG/INH IN AEPB: 1.0000 | INHALATION_SPRAY | Freq: Every day | RESPIRATORY_TRACT | 6 refills | Status: DC

## 2018-11-22 MED ORDER — ESOMEPRAZOLE MAGNESIUM 40 MG PO CPDR: 40.0000 mg | DELAYED_RELEASE_CAPSULE | Freq: Every day | ORAL | 3 refills | Status: DC

## 2018-11-22 NOTE — Telephone Encounter (Signed)
p pec Copied from [place] [phone]. Topic: Quick Communication - See Telephone Encounter >> Nov 22, 2018  9:49 AM Nils Flack wrote: CRM for notification. See Telephone encounter for: 11/22/18. Beginning Jan 1, pt insurance will not cover Nexium, Spiriva and Dulera Pt needs to change if you think its ok to : esomeprazole, increuse ellipta , advair diskus, breo  Pt uses [facility]  Cb for pt is [phone]

## 2018-11-22 NOTE — Telephone Encounter (Signed)
See below- I accidentally closed this for her;

## 2018-11-22 NOTE — Telephone Encounter (Signed)
Yes, that is fine to be change the medications;  Esomeprazole is the generic form of Nexium so that is an easy switch; We could try her on a medication called Trelegy that would combine the medications in Spiriva and Dulera into one medication. Let's start there and see if they will cover that for her.

## 2018-11-23 NOTE — Telephone Encounter (Signed)
Spoke with patient and info given 

## 2018-11-25 ENCOUNTER — Other Ambulatory Visit: Payer: Self-pay | Admitting: Family

## 2018-11-26 ENCOUNTER — Other Ambulatory Visit: Payer: Self-pay | Admitting: Internal Medicine

## 2018-11-26 ENCOUNTER — Other Ambulatory Visit: Payer: Self-pay | Admitting: Family

## 2018-11-26 MED ORDER — FLUTICASONE FUROATE-VILANTEROL 200-25 MCG/INH IN AEPB: 1.0000 | INHALATION_SPRAY | Freq: Every day | RESPIRATORY_TRACT | 3 refills | Status: DC

## 2018-11-29 ENCOUNTER — Other Ambulatory Visit: Payer: Self-pay | Admitting: Internal Medicine

## 2018-12-02 ENCOUNTER — Ambulatory Visit
Admission: RE | Admit: 2018-12-02 | Discharge: 2018-12-02 | Disposition: A | Discharge status: Home / Self Care | Payer: Medicare Other | Source: Ambulatory Visit | Attending: Radiation Oncology | Admitting: Radiation Oncology

## 2018-12-02 DIAGNOSIS — C3431 Malignant neoplasm of lower lobe, right bronchus or lung: Secondary | ICD-10-CM | POA: Diagnosis not present

## 2018-12-02 NOTE — Progress Notes (Signed)
  [facility] Radiation Oncology Simulation and Treatment Planning Note   Name:  Heather [name] MRN: [id number]   Date: 12/02/2018  DOB: 1935/11/23  Status:outpatient    DIAGNOSIS:    ICD-10-CM   1. Malignant neoplasm of lower lobe of right lung ([place]) C34.31      CONSENT VERIFIED:yes   SET UP: Patient is setup supine   IMMOBILIZATION: The patient was immobilized using a customized Vac Loc bag/ blue bag and customized accuform device   NARRATIVE:The patient was brought to the [facility].  Identity was confirmed.  All relevant records and images related to the planned course of therapy were reviewed.  Then, the patient was positioned in a stable reproducible clinical set-up for radiation therapy. Abdominal compression was applied by me.  4D CT images were obtained and reproducible breathing pattern was confirmed. Free breathing CT images were obtained.  Skin markings were placed.  The CT images were loaded into the planning software where the target and avoidance structures were contoured.  The radiation prescription was entered and confirmed.    TREATMENT PLANNING NOTE:  Treatment planning then occurred. I have requested : MLC's, isodose plan, basic dose calculation.  3 dimensional simulation is performed and dose volume histogram of the gross tumor volume, planning tumor volume and criticial normal structures including the spinal cord and lungs were analyzed and requested.  Special treatment procedure was performed due to high dose per fraction.  The patient will be monitored for increased risk of toxicity.  Daily imaging using cone beam CT will be used for target localization.  I anticipate that the patient will receive 54 Gy in 3 fractions to target volume. Further adjustments will be made based on the planning process is necessary.  ------------------------------------------------  Jodelle Gross, MD, PhD

## 2018-12-03 DIAGNOSIS — C3431 Malignant neoplasm of lower lobe, right bronchus or lung: Secondary | ICD-10-CM | POA: Diagnosis not present

## 2018-12-03 NOTE — Telephone Encounter (Signed)
Caller name: Marianna Fuss  Relation to pt: [employer] back number: [phone]  Pharmacy: [facility], [place] [phone] (Phone) [phone] (Fax)     Reason for call:   Pharmacy would like to know the status of traZODone (DESYREL) 50 MG tablet and dilTIAZem HCl Coated Beads TAKE (1) CAPSULE DAILY request.

## 2018-12-03 NOTE — Telephone Encounter (Signed)
Called and spoke with pharmacists and info updated.

## 2018-12-03 NOTE — Telephone Encounter (Signed)
Caller name: Marianna Fuss  Relation to pt: [employer] back number: [phone]  Pharmacy: [facility], [place] [phone] (Phone) [phone] (Fax)     Reason for call:         Pharmacy would like to know the status of sertraline (ZOLOFT) 50 MG tablet  request.

## 2018-12-03 NOTE — Telephone Encounter (Signed)
Please call the pharmacist and let them know they are sending all of her refill requests to the wrong office. They should be sending to [place] and [place]. That is why it is taking so long to get refills for her.

## 2018-12-09 ENCOUNTER — Other Ambulatory Visit: Payer: Self-pay | Admitting: Family

## 2018-12-09 NOTE — Telephone Encounter (Signed)
Please advise in PCP's absence. Thanks! [place] Controlled Database Checked Last filled: Clorazepate 12/11/ 19 #30 Zolpidem 11/01/18 # 30 LOV w/PCP: 04/30/18 Next appt w/PCP: None

## 2018-12-09 NOTE — Telephone Encounter (Signed)
Done erx 

## 2018-12-10 ENCOUNTER — Telehealth: Payer: Self-pay | Admitting: *Deleted

## 2018-12-10 DIAGNOSIS — C3431 Malignant neoplasm of lower lobe, right bronchus or lung: Secondary | ICD-10-CM

## 2018-12-10 DIAGNOSIS — J9601 Acute respiratory failure with hypoxia: Secondary | ICD-10-CM

## 2018-12-10 NOTE — Telephone Encounter (Signed)
Copied from [place] [phone]. Topic: General - Other >> Dec 09, 2018  2:19 PM Windy Kalata wrote: Reason for CRM: Patient will be starting radiation therapy on 12/15/2018, and she lives alone and states that she is already weak, she needs to know if she can be advised on how to get a home health care nurse to come to her house 3 times a week for 4 hours a day. Please advise as soon as you can so she can get this set up.  Best call back is [phone]

## 2018-12-10 NOTE — Telephone Encounter (Signed)
I have put in a referral to home health for her. They will have to evaluate her to see if she qualifies. Someone from home health should be in touch.

## 2018-12-10 NOTE — Telephone Encounter (Signed)
Pt informed of below.  

## 2018-12-13 ENCOUNTER — Telehealth: Payer: Self-pay | Admitting: Family

## 2018-12-13 NOTE — Telephone Encounter (Signed)
Please let her know that the services she is wanting to have someone come to her house 2-3 x per week to help her is not covered by home health; unfortunately, the type of care she is asking for would have to be paid for out of pocket.

## 2018-12-14 ENCOUNTER — Other Ambulatory Visit: Payer: Self-pay | Admitting: Family

## 2018-12-14 MED ORDER — DILTIAZEM HCL ER COATED BEADS 240 MG PO CP24: 240.0000 mg | ORAL_CAPSULE | Freq: Every day | ORAL | 1 refills | Status: DC

## 2018-12-14 NOTE — Telephone Encounter (Signed)
Spoke with patient today and info given. 

## 2018-12-15 ENCOUNTER — Ambulatory Visit
Admission: RE | Admit: 2018-12-15 | Discharge: 2018-12-15 | Disposition: A | Discharge status: Home / Self Care | Payer: Medicare Other | Source: Ambulatory Visit | Attending: Radiation Oncology | Admitting: Radiation Oncology

## 2018-12-15 DIAGNOSIS — C3431 Malignant neoplasm of lower lobe, right bronchus or lung: Secondary | ICD-10-CM | POA: Diagnosis not present

## 2018-12-17 ENCOUNTER — Ambulatory Visit
Admission: RE | Admit: 2018-12-17 | Discharge: 2018-12-17 | Disposition: A | Discharge status: Home / Self Care | Payer: Medicare Other | Source: Ambulatory Visit | Attending: Radiation Oncology | Admitting: Radiation Oncology

## 2018-12-17 DIAGNOSIS — C3431 Malignant neoplasm of lower lobe, right bronchus or lung: Secondary | ICD-10-CM | POA: Diagnosis not present

## 2018-12-19 ENCOUNTER — Other Ambulatory Visit: Payer: Self-pay | Admitting: Family

## 2018-12-20 ENCOUNTER — Ambulatory Visit: Payer: Medicare Other | Admitting: Radiation Oncology

## 2018-12-20 NOTE — Telephone Encounter (Signed)
She must schedule an OV for continued refills. I will refill the medications one more time as I know she is starting radiation this week.

## 2018-12-20 NOTE — Telephone Encounter (Signed)
Spoke with patient and info given 

## 2018-12-22 ENCOUNTER — Other Ambulatory Visit: Payer: Self-pay | Admitting: Radiation Oncology

## 2018-12-22 ENCOUNTER — Encounter: Payer: Self-pay | Admitting: Radiation Oncology

## 2018-12-22 ENCOUNTER — Ambulatory Visit
Admission: RE | Admit: 2018-12-22 | Discharge: 2018-12-22 | Disposition: A | Discharge status: Home / Self Care | Payer: Medicare Other | Source: Ambulatory Visit | Attending: Radiation Oncology | Admitting: Radiation Oncology

## 2018-12-22 DIAGNOSIS — C3431 Malignant neoplasm of lower lobe, right bronchus or lung: Secondary | ICD-10-CM | POA: Diagnosis not present

## 2018-12-22 NOTE — Progress Notes (Signed)
The patient finished her 3rd fraction of radiotherapy to the RLL. She has done well with mild fatigue. On exam she has RRR, no C/R/M. Chest is CTAB. She is anxious and has an elevated BP. She will be following up with PCP as well as to see Dr. Ander Slade for her COPD. I've ordered a CT chest to be the day of her follow up visit with me in March. She is in agreement and was given precautions to call if she had questions or concerns prior to this.     Carola Rhine, PAC

## 2018-12-23 NOTE — Progress Notes (Signed)
  Radiation Oncology         [phone] ________________________________  Name: Heather [name] MRN: [id number]  Date: 12/22/2018  DOB: May 27, 1935  End of Treatment Note  Diagnosis:   84 y.o. female with Putative Stage IA2, cT1bN0M0, NSCLC of the right lower lobe   Indication for treatment:  Curative       Radiation treatment dates:   12/15/2018, 12/17/2018, 12/22/2018  Site/dose:   The tumor in the RLL lung was treated with a course of stereotactic body radiation treatment. The patient received 54 Gy in 3 fractions at 18 Gy per fraction.  Beams/energy:   SBRT/SRT-VMAT // 6X-FFF Photon  Narrative: The patient tolerated radiation treatment relatively well.   The patient did not have any signs of acute toxicity during treatment.  Plan: The patient has completed radiation treatment. The patient will return to radiation oncology clinic for routine followup in one month. I advised the patient to call or return sooner if they have any questions or concerns related to their recovery or treatment.   ------------------------------------------------  Jodelle Gross, MD, PhD  This document serves as a record of services personally performed by Kyung Rudd, MD. It was created on his behalf by Rae Lips, a trained medical scribe. The creation of this record is based on the scribe's personal observations and the provider's statements to them. This document has been checked and approved by the attending provider.

## 2019-01-19 ENCOUNTER — Encounter: Payer: Self-pay | Admitting: Family

## 2019-01-19 ENCOUNTER — Ambulatory Visit (INDEPENDENT_AMBULATORY_CARE_PROVIDER_SITE_OTHER)
Admission: RE | Admit: 2019-01-19 | Discharge: 2019-01-19 | Disposition: A | Discharge status: Home / Self Care | Payer: Medicare Other | Source: Ambulatory Visit | Attending: Family | Admitting: Family

## 2019-01-19 ENCOUNTER — Ambulatory Visit (INDEPENDENT_AMBULATORY_CARE_PROVIDER_SITE_OTHER): Payer: Medicare Other | Admitting: Family

## 2019-01-19 VITALS — BP 114/72 | HR 72 | Temp 98.2°F | Ht <= 58 in | Wt 110.1 lb

## 2019-01-19 DIAGNOSIS — C3431 Malignant neoplasm of lower lobe, right bronchus or lung: Secondary | ICD-10-CM

## 2019-01-19 DIAGNOSIS — M25511 Pain in right shoulder: Secondary | ICD-10-CM | POA: Diagnosis not present

## 2019-01-19 DIAGNOSIS — R600 Localized edema: Secondary | ICD-10-CM

## 2019-01-19 DIAGNOSIS — R911 Solitary pulmonary nodule: Secondary | ICD-10-CM | POA: Diagnosis not present

## 2019-01-19 DIAGNOSIS — G4700 Insomnia, unspecified: Secondary | ICD-10-CM | POA: Diagnosis not present

## 2019-01-19 DIAGNOSIS — G894 Chronic pain syndrome: Secondary | ICD-10-CM

## 2019-01-19 MED ORDER — HYDROCODONE-ACETAMINOPHEN 5-325 MG PO TABS: ORAL_TABLET | ORAL | 0 refills | Status: DC

## 2019-01-19 MED ORDER — CLORAZEPATE DIPOTASSIUM 15 MG PO TABS: 15.0000 mg | ORAL_TABLET | Freq: Two times a day (BID) | ORAL | 2 refills | Status: DC

## 2019-01-19 MED ORDER — FUROSEMIDE 20 MG PO TABS: ORAL_TABLET | ORAL | 0 refills | Status: DC

## 2019-01-19 MED ORDER — TRAMADOL HCL 50 MG PO TABS: 50.0000 mg | ORAL_TABLET | Freq: Four times a day (QID) | ORAL | 1 refills | Status: DC | PRN

## 2019-01-19 MED ORDER — PROMETHAZINE HCL 25 MG PO TABS: 25.0000 mg | ORAL_TABLET | Freq: Three times a day (TID) | ORAL | 0 refills | Status: DC | PRN

## 2019-01-19 NOTE — Progress Notes (Signed)
Heather [name] is a 84 y.o. female with the following history as recorded in EpicCare:  Patient Active Problem List   Diagnosis Date Noted  . Malignant neoplasm of lower lobe of right lung ([place]) 11/16/2018  . Dehydration   . Abdominal pain 02/27/2018  . Colitis 02/27/2018  . SBO (small bowel obstruction) ([place]) 02/27/2018  . Hypokalemia 02/19/2018  . Oral thrush 01/29/2018  . Malnutrition of moderate degree 01/29/2018  . COPD with acute exacerbation ([place]) 01/28/2018  . COPD with emphysema ([place]) 06/26/2017  . COPD exacerbation ([place]) 05/29/2016  . Hypoxia 05/29/2016  . Coronary atherosclerosis 07/24/2015  . Atherosclerosis of aorta ([place]) 07/24/2015  . Dysphagia 07/24/2015  . Acute respiratory failure with hypoxia ([place]) 07/23/2015  . Bronchospasm with bronchitis, acute 07/23/2015  . HIP PAIN, LEFT 05/16/2010  . LOW BACK PAIN, CHRONIC 10/01/2009  . PARESTHESIA 10/01/2009  . Hypothyroidism 07/23/2009  . Dyslipidemia 07/23/2009  . Anxiety state 07/23/2009  . Anxiety and depression 07/23/2009  . Essential hypertension 07/23/2009  . GERD 07/23/2009  . DIVERTICULOSIS, COLON 07/23/2009  . HEADACHE 07/23/2009    Current Outpatient Medications  Medication Sig Dispense Refill  . Albuterol Sulfate (PROAIR RESPICLICK) 465 (90 Base) MCG/ACT AEPB Inhale 2 puffs into the lungs 4 (four) times daily as needed. (Patient taking differently: Inhale 2 puffs into the lungs 4 (four) times daily as needed (sob and wheezing). ) 1 each 6  . benzonatate (TESSALON) 100 MG capsule TAKE 1 CAPSULE TWICE DAILY AS NEEDED FOR COUGH. 20 capsule 0  . clorazepate (TRANXENE) 15 MG tablet Take 1 tablet (15 mg total) by mouth 2 (two) times daily. 60 tablet 2  . diltiazem (CARDIZEM CD) 240 MG 24 hr capsule Take 1 capsule (240 mg total) by mouth daily. 90 capsule 1  . esomeprazole (NEXIUM) 40 MG capsule Take 1 capsule (40 mg total) by mouth daily. 90 capsule 3  . fluticasone furoate-vilanterol (BREO ELLIPTA) 200-25  MCG/INH AEPB Inhale 1 puff into the lungs daily. 1 each 3  . folic acid (FOLVITE) 1 MG tablet TAKE 1 TABLET ONCE DAILY. 90 tablet 0  . HYDROcodone-acetaminophen (NORCO/VICODIN) 5-325 MG tablet 1 tablet every 6- 8 hours as needed for severe pain not controlled by Ultram 60 tablet 0  . levothyroxine (SYNTHROID, LEVOTHROID) 25 MCG tablet Take 1 tablet (25 mcg total) by mouth daily before breakfast. 90 tablet 2  . Multiple Vitamin (MULTIVITAMIN WITH MINERALS) TABS tablet Take 1 tablet by mouth daily. 30 tablet 1  . Polyethyl Glycol-Propyl Glycol (SYSTANE OP) Place 1 drop into both eyes 2 (two) times daily as needed (for dry eyes).     . promethazine (PHENERGAN) 25 MG tablet Take 1 tablet (25 mg total) by mouth every 8 (eight) hours as needed for nausea or vomiting. 30 tablet 0  . senna-docusate (SENOKOT-S) 8.6-50 MG tablet Take 2 tablets by mouth at bedtime. 60 tablet 2  . sertraline (ZOLOFT) 50 MG tablet TAKE 1 TABLET EACH DAY. 90 tablet 0  . traMADol (ULTRAM) 50 MG tablet Take 1 tablet (50 mg total) by mouth every 6 (six) hours as needed. 60 tablet 1  . traZODone (DESYREL) 50 MG tablet TAKE 1/2 TO 1 TABLET AT BEDTIME AS NEEDED FOR REST. 90 tablet 0  . furosemide (LASIX) 20 MG tablet TAKE 1 TABLET DAILY IF NEEDED FOR SIGNIFICANT LOWER EXTREMITY SWELLING. 90 tablet 0   No current facility-administered medications for this visit.     Allergies: Tylenol [acetaminophen] and Symbicort [budesonide-formoterol fumarate]  Past Medical History:  Diagnosis Date  . ANXIETY 07/23/2009  . Atherosclerosis of aorta ([place]) 07/24/2015  . Complication of anesthesia 7 or 8 yrs ago   woke up during colonscopy  . COPD (chronic obstructive pulmonary disease) ([place])   . Coronary atherosclerosis 07/24/2015  . DEPRESSION 07/23/2009  . DIVERTICULOSIS, COLON 07/23/2009  . GERD 07/23/2009  . Headache(784.0) 07/23/2009   occasional  . Hemorrhoids   . HIP PAIN, LEFT 05/16/2010  . History of Crohn's disease   . HYPERLIPIDEMIA  07/23/2009  . HYPERTENSION 07/23/2009  . HYPOTHYROIDISM 07/23/2009  . IBS (irritable bowel syndrome)   . Insomnia   . LOW BACK PAIN, CHRONIC 10/01/2009  . PARESTHESIA 10/01/2009  . Shingles 2006   back    Past Surgical History:  Procedure Laterality Date  . ABDOMINAL HYSTERECTOMY  age 50 or 16  . APPENDECTOMY  yrs ago  . BILATERAL SALPINGOOPHORECTOMY  age 58 or 29  . COLONOSCOPY N/A 11/17/2013   Procedure: COLONOSCOPY;  Surgeon: Cleotis Nipper, MD;  Location: [facility];  Service: Endoscopy;  Laterality: N/A;  . ESOPHAGOGASTRODUODENOSCOPY N/A 11/17/2013   Procedure: ESOPHAGOGASTRODUODENOSCOPY (EGD);  Surgeon: Cleotis Nipper, MD;  Location: [facility];  Service: Endoscopy;  Laterality: N/A;  . HEMORRHOID SURGERY  yrs ago  . TONSILLECTOMY  yrs ago    Family History  Problem Relation Age of Onset  . Cancer Mother        Unknown type  . Cancer Sister        Breast  . Clotting disorder Neg Hx     Social History   Tobacco Use  . Smoking status: Former Smoker    Packs/day: 0.50    Types: Cigarettes    Start date: 07/16/1960    Last attempt to quit: 07/16/1998    Years since quitting: 20.5  . Smokeless tobacco: Never Used  Substance Use Topics  . Alcohol use: No    Alcohol/week: 0.0 standard drinks    Subjective:  Accompanied by her son today; presents to follow-up on chronic care needs; On a positive note, has gained weight since patient was last here- 1) Requesting refill on Lasix- uses as needed for swelling in her right arm; 2) Increased pain in right shoulder x 1 week- "heard popping sound"/ limited ROM 3) Admits that anxiety has been very high recently- has completed 3 rounds of radiation/ scheduled for follow-up there in early March; would like to discuss increasing dosage of Tranxene; has only been taking 1/2 tablet bid as needed;  4) Not sleeping well- not currently taking Ambien- not effective; ? If taking Trazodone; 5) Needs refill on her chronic medications-  has been having increased pain recently; takes Tramadol daily and uses Norco for breakthrough pain;       Objective:  Vitals:   01/19/19 1310  BP: 114/72  Pulse: 72  Temp: 98.2 F (36.8 C)  TempSrc: Oral  SpO2: 97%  Weight: 110 lb 1.3 oz (49.9 kg)  Height: 4\' 10"  (1.473 m)    General: Well developed, well nourished, in no acute distress  Skin : Warm and dry.  Head: Normocephalic and atraumatic  Eyes: Sclera and conjunctiva clear; pupils round and reactive to light; extraocular movements intact  Ears: External normal; canals clear; tympanic membranes normal  Oropharynx: Pink, supple. No suspicious lesions  Neck: Supple without thyromegaly, adenopathy  Lungs: Respirations unlabored; clear to auscultation bilaterally without wheeze, rales, rhonchi  CVS exam: normal rate and regular rhythm.  Musculoskeletal: No deformities; no active joint inflammation  Extremities:  No edema, cyanosis, clubbing  Vessels: Symmetric bilaterally  Neurologic: Alert and oriented; speech intact; face symmetrical; moves all extremities well; CNII-XII intact without focal deficit   Assessment:  1. Acute pain of right shoulder   2. Pedal edema   3. Malignant neoplasm of lower lobe of right lung ([place])   4. Chronic pain syndrome   5. Insomnia, unspecified type     Plan:  1. Update right shoulder x-ray; follow-up to be determined; 2. Update CXR; refill given on Lasix to use as needed; follow-up to be determined; 3. Keep planned follow-up with radiation oncologist;  4. Refills updated; chronic pain contract updated today; will need to get drug screen at later date;  5. Encouraged to use Trazodone 50 mg qhs; dosage can be increased if needed; will d/c Ambien;   No follow-ups on file.  Orders Placed This Encounter  Procedures  . DG Chest 2 View    Standing Status:   Future    Number of Occurrences:   1    Standing Expiration Date:   03/19/2020    Order Specific Question:   Reason for Exam (SYMPTOM  OR  DIAGNOSIS REQUIRED)    Answer:   right shoulder pain    Order Specific Question:   Preferred imaging location?    Answer:   [facility]    Order Specific Question:   Radiology Contrast Protocol - do NOT remove file path    Answer:   \\[email or web address]s.pdf  . DG Shoulder Right    Standing Status:   Future    Number of Occurrences:   1    Standing Expiration Date:   03/19/2020    Order Specific Question:   Reason for Exam (SYMPTOM  OR DIAGNOSIS REQUIRED)    Answer:   right shoulder pain    Order Specific Question:   Preferred imaging location?    Answer:   [facility]    Order Specific Question:   Radiology Contrast Protocol - do NOT remove file path    Answer:   \\[email or web address]s.[email or web address]    Requested Prescriptions   Signed Prescriptions Disp Refills  . furosemide (LASIX) 20 MG tablet 90 tablet 0    Sig: TAKE 1 TABLET DAILY IF NEEDED FOR SIGNIFICANT LOWER EXTREMITY SWELLING.  . clorazepate (TRANXENE) 15 MG tablet 60 tablet 2    Sig: Take 1 tablet (15 mg total) by mouth 2 (two) times daily.  [name] HYDROcodone-acetaminophen (NORCO/VICODIN) 5-325 MG tablet 60 tablet 0    Sig: 1 tablet every 6- 8 hours as needed for severe pain not controlled by Ultram  . promethazine (PHENERGAN) 25 MG tablet 30 tablet 0    Sig: Take 1 tablet (25 mg total) by mouth every 8 (eight) hours as needed for nausea or vomiting.  . traMADol (ULTRAM) 50 MG tablet 60 tablet 1    Sig: Take 1 tablet (50 mg total) by mouth every 6 (six) hours as needed.

## 2019-01-21 ENCOUNTER — Ambulatory Visit: Payer: Medicare Other | Admitting: Family

## 2019-02-10 ENCOUNTER — Telehealth: Payer: Self-pay | Admitting: *Deleted

## 2019-02-10 NOTE — Telephone Encounter (Signed)
CALLED PATIENT TO INFORM OF LABS AND CT FOR 02-14-19 - LAB APPT. 02-14-19 @ 1:15 PM, CT 02-14-19- ARRIVAL TIME - 2:15 PM, PT. TO HAVE WATER ONLY - 4 HRS. PRIOR TO TEST, AND PT. TO FU WITH A. PERKINS ON 02-14-19, LVM FOR A RETURN CALL

## 2019-02-14 ENCOUNTER — Ambulatory Visit: Payer: Medicare Other | Admitting: Radiation Oncology

## 2019-02-14 ENCOUNTER — Ambulatory Visit: Payer: Medicare Other

## 2019-02-14 ENCOUNTER — Ambulatory Visit (HOSPITAL_COMMUNITY): Payer: Medicare Other

## 2019-02-14 ENCOUNTER — Telehealth: Payer: Self-pay | Admitting: Radiation Oncology

## 2019-02-14 NOTE — Telephone Encounter (Signed)
The patient cancelled her appointment today due to nausea and GI upset. I spoke with her son and discussed the plans to proceed with her CT scan in the next week or so. I will call them with results. If her scan looks good which I expect, we will repeat in 6 months and see her then. [name] was in agreement.

## 2019-02-18 ENCOUNTER — Ambulatory Visit (HOSPITAL_COMMUNITY)
Admission: RE | Admit: 2019-02-18 | Discharge: 2019-02-18 | Disposition: A | Discharge status: Home / Self Care | Payer: Medicare Other | Source: Ambulatory Visit | Attending: Radiation Oncology | Admitting: Radiation Oncology

## 2019-02-18 ENCOUNTER — Other Ambulatory Visit: Payer: Self-pay

## 2019-02-18 DIAGNOSIS — C3490 Malignant neoplasm of unspecified part of unspecified bronchus or lung: Secondary | ICD-10-CM | POA: Diagnosis not present

## 2019-02-18 DIAGNOSIS — C3431 Malignant neoplasm of lower lobe, right bronchus or lung: Secondary | ICD-10-CM | POA: Diagnosis not present

## 2019-02-18 LAB — POCT I-STAT CREATININE: Creatinine, Ser: 0.9 mg/dL (ref 0.44–1.00)

## 2019-02-18 MED ORDER — IOHEXOL 300 MG/ML  SOLN
75.0000 mL | Freq: Once | INTRAMUSCULAR | Status: AC | PRN
  Administered 2019-02-18: 75 mL via INTRAVENOUS

## 2019-02-18 MED ORDER — SODIUM CHLORIDE (PF) 0.9 % IJ SOLN
INTRAMUSCULAR | Status: AC
  Filled 2019-02-18: qty 50

## 2019-02-19 ENCOUNTER — Other Ambulatory Visit: Payer: Self-pay | Admitting: Family

## 2019-02-21 ENCOUNTER — Telehealth: Payer: Self-pay | Admitting: Radiation Oncology

## 2019-02-21 NOTE — Telephone Encounter (Signed)
Lets make sure she has an appointment to come to the office

## 2019-02-21 NOTE — Telephone Encounter (Signed)
I called the patient and reviewed her CT scan results. We will plan to see her back in 6 months for repeat scan per NCCN guidelines. I let her know the importance to keep her routine follow up with Dr. Ander Slade and her PCP [name] and that scans may need to be ordered differently if she's symptomatic, though I'll plan to see her q6 months. She's in agreement with this plan.

## 2019-02-23 ENCOUNTER — Other Ambulatory Visit: Payer: Self-pay | Admitting: Internal Medicine

## 2019-02-23 NOTE — Telephone Encounter (Signed)
Patient has been scheduled for the follow up appointment nothing further is need at this time.

## 2019-02-24 ENCOUNTER — Other Ambulatory Visit: Payer: Self-pay | Admitting: Family Medicine

## 2019-02-24 ENCOUNTER — Other Ambulatory Visit: Payer: Self-pay | Admitting: Family

## 2019-03-02 ENCOUNTER — Other Ambulatory Visit: Payer: Self-pay | Admitting: Family

## 2019-03-15 ENCOUNTER — Telehealth: Payer: Self-pay | Admitting: Radiation Oncology

## 2019-03-15 ENCOUNTER — Telehealth: Payer: Self-pay | Admitting: Emergency Medicine

## 2019-03-15 ENCOUNTER — Other Ambulatory Visit: Payer: Self-pay | Admitting: Radiation Oncology

## 2019-03-15 DIAGNOSIS — C3431 Malignant neoplasm of lower lobe, right bronchus or lung: Secondary | ICD-10-CM

## 2019-03-15 NOTE — Telephone Encounter (Signed)
The patient has a history of a Stage IA lung cancer s/p XRT to the right lung. She is due for a CT in September but I don't see my previous order. I replaced this order. She called however to let us know she was having tremendous right ear pain. She reports this has been over the las 2-3 days. She denies injury, bleeding, discharge, fever, but states her ear is warm. She has been dizzy as well. I encouraged her to check with her PCP to see if she can be evaluated. I explained that due to the coronavirus pandemic we are not able to see nonurgent visits, but that I wasn't sure what was possible for Internal Medicine Clinics. I will forward this to her PCP Heather Mourning, Heather [name], and I also called her son Heather [name] as the patient seemed somewhat confused about why she couldn't get out of the house and indicated it was the bad weather rather than pandemic. He will follow up with her by phone as well this afternoon.

## 2019-03-15 NOTE — Telephone Encounter (Signed)
Copied from [place] [id number]. Topic: General - Other >> Mar 15, 2019  1:59 PM Carolyn Stare wrote:   Pt has a right ear ache and is asking for something for pain

## 2019-03-15 NOTE — Telephone Encounter (Signed)
Should pt schedule with another provider?

## 2019-03-15 NOTE — Telephone Encounter (Signed)
Tried contacting pt, unable to get through.

## 2019-03-16 NOTE — Telephone Encounter (Signed)
I will put her on with [facility] today. She only has one patient

## 2019-03-16 NOTE — Telephone Encounter (Signed)
Can you please set her up with a virtual visit with someone in the office today? Thanks-

## 2019-03-16 NOTE — Telephone Encounter (Signed)
Contacted patient to schedule.  Patient used some ear drops that she had a home with a warm wash cloth and states that it feels much better. She does not wish to have a visit at this time and will contact us if things worsen.

## 2019-03-29 ENCOUNTER — Ambulatory Visit: Payer: Medicare Other | Admitting: Pulmonary Disease

## 2019-04-08 ENCOUNTER — Ambulatory Visit: Payer: Medicare Other | Attending: Radiation Oncology

## 2019-04-08 DIAGNOSIS — C3431 Malignant neoplasm of lower lobe, right bronchus or lung: Secondary | ICD-10-CM | POA: Insufficient documentation

## 2019-04-11 ENCOUNTER — Ambulatory Visit: Payer: Medicare Other | Admitting: Radiation Oncology

## 2019-04-22 ENCOUNTER — Emergency Department (HOSPITAL_COMMUNITY): Payer: Medicare Other

## 2019-04-22 ENCOUNTER — Other Ambulatory Visit: Payer: Self-pay

## 2019-04-22 ENCOUNTER — Encounter (HOSPITAL_COMMUNITY): Payer: Self-pay | Admitting: Emergency Medicine

## 2019-04-22 ENCOUNTER — Inpatient Hospital Stay (HOSPITAL_COMMUNITY)
Admission: EM | Admit: 2019-04-22 | Discharge: 2019-04-25 | DRG: 558 | Disposition: A | Discharge status: Home Health | Payer: Medicare Other | Attending: Internal Medicine | Admitting: Internal Medicine

## 2019-04-22 DIAGNOSIS — M545 Low back pain: Secondary | ICD-10-CM | POA: Diagnosis not present

## 2019-04-22 DIAGNOSIS — W19XXXA Unspecified fall, initial encounter: Secondary | ICD-10-CM | POA: Diagnosis present

## 2019-04-22 DIAGNOSIS — Z87891 Personal history of nicotine dependence: Secondary | ICD-10-CM

## 2019-04-22 DIAGNOSIS — E860 Dehydration: Secondary | ICD-10-CM | POA: Diagnosis present

## 2019-04-22 DIAGNOSIS — M48061 Spinal stenosis, lumbar region without neurogenic claudication: Secondary | ICD-10-CM | POA: Diagnosis present

## 2019-04-22 DIAGNOSIS — R296 Repeated falls: Secondary | ICD-10-CM | POA: Diagnosis present

## 2019-04-22 DIAGNOSIS — Z803 Family history of malignant neoplasm of breast: Secondary | ICD-10-CM

## 2019-04-22 DIAGNOSIS — R748 Abnormal levels of other serum enzymes: Secondary | ICD-10-CM

## 2019-04-22 DIAGNOSIS — Z1159 Encounter for screening for other viral diseases: Secondary | ICD-10-CM | POA: Diagnosis not present

## 2019-04-22 DIAGNOSIS — W1830XA Fall on same level, unspecified, initial encounter: Secondary | ICD-10-CM | POA: Diagnosis present

## 2019-04-22 DIAGNOSIS — S3993XA Unspecified injury of pelvis, initial encounter: Secondary | ICD-10-CM | POA: Diagnosis not present

## 2019-04-22 DIAGNOSIS — R54 Age-related physical debility: Secondary | ICD-10-CM | POA: Diagnosis present

## 2019-04-22 DIAGNOSIS — I10 Essential (primary) hypertension: Secondary | ICD-10-CM | POA: Diagnosis present

## 2019-04-22 DIAGNOSIS — E876 Hypokalemia: Secondary | ICD-10-CM | POA: Diagnosis present

## 2019-04-22 DIAGNOSIS — I700 Atherosclerosis of aorta: Secondary | ICD-10-CM | POA: Diagnosis present

## 2019-04-22 DIAGNOSIS — M5489 Other dorsalgia: Secondary | ICD-10-CM | POA: Diagnosis not present

## 2019-04-22 DIAGNOSIS — Z79899 Other long term (current) drug therapy: Secondary | ICD-10-CM

## 2019-04-22 DIAGNOSIS — E785 Hyperlipidemia, unspecified: Secondary | ICD-10-CM | POA: Diagnosis present

## 2019-04-22 DIAGNOSIS — S199XXA Unspecified injury of neck, initial encounter: Secondary | ICD-10-CM | POA: Diagnosis not present

## 2019-04-22 DIAGNOSIS — J439 Emphysema, unspecified: Secondary | ICD-10-CM | POA: Diagnosis not present

## 2019-04-22 DIAGNOSIS — F419 Anxiety disorder, unspecified: Secondary | ICD-10-CM | POA: Diagnosis not present

## 2019-04-22 DIAGNOSIS — S3992XA Unspecified injury of lower back, initial encounter: Secondary | ICD-10-CM | POA: Diagnosis not present

## 2019-04-22 DIAGNOSIS — I2510 Atherosclerotic heart disease of native coronary artery without angina pectoris: Secondary | ICD-10-CM | POA: Diagnosis present

## 2019-04-22 DIAGNOSIS — K5730 Diverticulosis of large intestine without perforation or abscess without bleeding: Secondary | ICD-10-CM | POA: Diagnosis present

## 2019-04-22 DIAGNOSIS — S0990XA Unspecified injury of head, initial encounter: Secondary | ICD-10-CM | POA: Diagnosis not present

## 2019-04-22 DIAGNOSIS — S299XXA Unspecified injury of thorax, initial encounter: Secondary | ICD-10-CM | POA: Diagnosis not present

## 2019-04-22 DIAGNOSIS — R51 Headache: Secondary | ICD-10-CM | POA: Diagnosis not present

## 2019-04-22 DIAGNOSIS — Z85118 Personal history of other malignant neoplasm of bronchus and lung: Secondary | ICD-10-CM

## 2019-04-22 DIAGNOSIS — Y92009 Unspecified place in unspecified non-institutional (private) residence as the place of occurrence of the external cause: Secondary | ICD-10-CM | POA: Diagnosis present

## 2019-04-22 DIAGNOSIS — Z888 Allergy status to other drugs, medicaments and biological substances status: Secondary | ICD-10-CM

## 2019-04-22 DIAGNOSIS — K589 Irritable bowel syndrome without diarrhea: Secondary | ICD-10-CM | POA: Diagnosis present

## 2019-04-22 DIAGNOSIS — Z923 Personal history of irradiation: Secondary | ICD-10-CM

## 2019-04-22 DIAGNOSIS — E039 Hypothyroidism, unspecified: Secondary | ICD-10-CM | POA: Diagnosis present

## 2019-04-22 DIAGNOSIS — R7989 Other specified abnormal findings of blood chemistry: Secondary | ICD-10-CM | POA: Diagnosis not present

## 2019-04-22 DIAGNOSIS — F329 Major depressive disorder, single episode, unspecified: Secondary | ICD-10-CM | POA: Diagnosis present

## 2019-04-22 DIAGNOSIS — D72829 Elevated white blood cell count, unspecified: Secondary | ICD-10-CM | POA: Diagnosis present

## 2019-04-22 DIAGNOSIS — Z79891 Long term (current) use of opiate analgesic: Secondary | ICD-10-CM

## 2019-04-22 DIAGNOSIS — M6282 Rhabdomyolysis: Principal | ICD-10-CM | POA: Diagnosis present

## 2019-04-22 DIAGNOSIS — Z66 Do not resuscitate: Secondary | ICD-10-CM | POA: Diagnosis present

## 2019-04-22 DIAGNOSIS — M549 Dorsalgia, unspecified: Secondary | ICD-10-CM | POA: Diagnosis not present

## 2019-04-22 DIAGNOSIS — K219 Gastro-esophageal reflux disease without esophagitis: Secondary | ICD-10-CM | POA: Diagnosis present

## 2019-04-22 DIAGNOSIS — J438 Other emphysema: Secondary | ICD-10-CM | POA: Diagnosis present

## 2019-04-22 DIAGNOSIS — R52 Pain, unspecified: Secondary | ICD-10-CM | POA: Diagnosis not present

## 2019-04-22 DIAGNOSIS — Z886 Allergy status to analgesic agent status: Secondary | ICD-10-CM

## 2019-04-22 DIAGNOSIS — Z90710 Acquired absence of both cervix and uterus: Secondary | ICD-10-CM

## 2019-04-22 DIAGNOSIS — G8929 Other chronic pain: Secondary | ICD-10-CM | POA: Diagnosis present

## 2019-04-22 DIAGNOSIS — Z9181 History of falling: Secondary | ICD-10-CM

## 2019-04-22 LAB — CBC WITH DIFFERENTIAL/PLATELET
Abs Immature Granulocytes: 0.08 10*3/uL — ABNORMAL HIGH (ref 0.00–0.07)
Basophils Absolute: 0 10*3/uL (ref 0.0–0.1)
Basophils Relative: 0 %
Eosinophils Absolute: 0 10*3/uL (ref 0.0–0.5)
Eosinophils Relative: 0 %
HCT: 39.6 % (ref 36.0–46.0)
Hemoglobin: 12.2 g/dL (ref 12.0–15.0)
Immature Granulocytes: 1 %
Lymphocytes Relative: 4 %
Lymphs Abs: 0.7 10*3/uL (ref 0.7–4.0)
MCH: 26.5 pg (ref 26.0–34.0)
MCHC: 30.8 g/dL (ref 30.0–36.0)
MCV: 85.9 fL (ref 80.0–100.0)
Monocytes Absolute: 1.4 10*3/uL — ABNORMAL HIGH (ref 0.1–1.0)
Monocytes Relative: 9 %
Neutro Abs: 14.3 10*3/uL — ABNORMAL HIGH (ref 1.7–7.7)
Neutrophils Relative %: 86 %
Platelets: 357 10*3/uL (ref 150–400)
RBC: 4.61 MIL/uL (ref 3.87–5.11)
RDW: 14.3 % (ref 11.5–15.5)
WBC: 16.5 10*3/uL — ABNORMAL HIGH (ref 4.0–10.5)
nRBC: 0 % (ref 0.0–0.2)

## 2019-04-22 LAB — URINALYSIS, ROUTINE W REFLEX MICROSCOPIC
Bilirubin Urine: NEGATIVE
Glucose, UA: NEGATIVE mg/dL
Ketones, ur: NEGATIVE mg/dL
Leukocytes,Ua: NEGATIVE
Nitrite: NEGATIVE
Protein, ur: NEGATIVE mg/dL
Specific Gravity, Urine: 1.01 (ref 1.005–1.030)
pH: 6 (ref 5.0–8.0)

## 2019-04-22 LAB — TSH: TSH: 0.761 u[IU]/mL (ref 0.350–4.500)

## 2019-04-22 LAB — COMPREHENSIVE METABOLIC PANEL
ALT: 21 U/L (ref 0–44)
AST: 48 U/L — ABNORMAL HIGH (ref 15–41)
Albumin: 4.8 g/dL (ref 3.5–5.0)
Alkaline Phosphatase: 102 U/L (ref 38–126)
Anion gap: 12 (ref 5–15)
BUN: 11 mg/dL (ref 8–23)
CO2: 29 mmol/L (ref 22–32)
Calcium: 10.1 mg/dL (ref 8.9–10.3)
Chloride: 100 mmol/L (ref 98–111)
Creatinine, Ser: 0.97 mg/dL (ref 0.44–1.00)
GFR calc Af Amer: >60 mL/min (ref >60)
GFR calc non Af Amer: 55 mL/min — ABNORMAL LOW (ref >60)
Glucose, Bld: 137 mg/dL — ABNORMAL HIGH (ref 70–99)
Potassium: 3.4 mmol/L — ABNORMAL LOW (ref 3.5–5.1)
Sodium: 141 mmol/L (ref 135–145)
Total Bilirubin: 0.9 mg/dL (ref 0.3–1.2)
Total Protein: 7.8 g/dL (ref 6.5–8.1)

## 2019-04-22 LAB — URINALYSIS, MICROSCOPIC (REFLEX)

## 2019-04-22 LAB — SARS CORONAVIRUS 2 BY RT PCR (HOSPITAL ORDER, PERFORMED IN ~~LOC~~ HOSPITAL LAB): SARS Coronavirus 2: NEGATIVE

## 2019-04-22 LAB — CK: Total CK: 800 U/L — ABNORMAL HIGH (ref 38–234)

## 2019-04-22 LAB — TROPONIN I: Troponin I: <0.03 ng/mL (ref <0.03)

## 2019-04-22 MED ORDER — FLUTICASONE FUROATE-VILANTEROL 200-25 MCG/INH IN AEPB
1.0000 | INHALATION_SPRAY | Freq: Every day | RESPIRATORY_TRACT | Status: DC
  Administered 2019-04-24 – 2019-04-25 (×2): 1 via RESPIRATORY_TRACT
  Filled 2019-04-22: qty 28

## 2019-04-22 MED ORDER — ZOLPIDEM TARTRATE 5 MG PO TABS
5.0000 mg | ORAL_TABLET | Freq: Every evening | ORAL | Status: DC | PRN
  Administered 2019-04-22 – 2019-04-24 (×2): 5 mg via ORAL
  Filled 2019-04-22 (×2): qty 1

## 2019-04-22 MED ORDER — DILTIAZEM HCL ER COATED BEADS 240 MG PO CP24
240.0000 mg | ORAL_CAPSULE | Freq: Every day | ORAL | Status: DC
  Administered 2019-04-23 – 2019-04-25 (×3): 240 mg via ORAL
  Filled 2019-04-22 (×3): qty 1

## 2019-04-22 MED ORDER — POTASSIUM CHLORIDE CRYS ER 20 MEQ PO TBCR
20.0000 meq | EXTENDED_RELEASE_TABLET | ORAL | Status: AC
  Administered 2019-04-22: 20 meq via ORAL
  Filled 2019-04-22: qty 1

## 2019-04-22 MED ORDER — ALBUTEROL SULFATE (2.5 MG/3ML) 0.083% IN NEBU: 2.5000 mg | INHALATION_SOLUTION | Freq: Four times a day (QID) | RESPIRATORY_TRACT | Status: DC | PRN

## 2019-04-22 MED ORDER — ENOXAPARIN SODIUM 40 MG/0.4ML ~~LOC~~ SOLN
40.0000 mg | SUBCUTANEOUS | Status: DC
  Administered 2019-04-22 – 2019-04-24 (×3): 40 mg via SUBCUTANEOUS
  Filled 2019-04-22 (×3): qty 0.4

## 2019-04-22 MED ORDER — SODIUM CHLORIDE 0.9 % IV BOLUS
1000.0000 mL | Freq: Once | INTRAVENOUS | Status: AC
  Administered 2019-04-22: 1000 mL via INTRAVENOUS

## 2019-04-22 MED ORDER — LEVOTHYROXINE SODIUM 25 MCG PO TABS
25.0000 ug | ORAL_TABLET | Freq: Every day | ORAL | Status: DC
  Administered 2019-04-23 – 2019-04-25 (×3): 25 ug via ORAL
  Filled 2019-04-22 (×3): qty 1

## 2019-04-22 MED ORDER — POLYETHYL GLYCOL-PROPYL GLYCOL 0.4-0.3 % OP GEL: Freq: Two times a day (BID) | OPHTHALMIC | Status: DC | PRN

## 2019-04-22 MED ORDER — ONDANSETRON HCL 4 MG PO TABS: 4.0000 mg | ORAL_TABLET | Freq: Four times a day (QID) | ORAL | Status: DC | PRN

## 2019-04-22 MED ORDER — CLORAZEPATE DIPOTASSIUM 3.75 MG PO TABS
15.0000 mg | ORAL_TABLET | Freq: Two times a day (BID) | ORAL | Status: DC | PRN
  Administered 2019-04-24 (×2): 15 mg via ORAL
  Filled 2019-04-22 (×2): qty 4

## 2019-04-22 MED ORDER — ONDANSETRON HCL 4 MG/2ML IJ SOLN
4.0000 mg | Freq: Four times a day (QID) | INTRAMUSCULAR | Status: DC | PRN
  Administered 2019-04-23 – 2019-04-25 (×7): 4 mg via INTRAVENOUS
  Filled 2019-04-22 (×7): qty 2

## 2019-04-22 MED ORDER — HYDROCODONE-ACETAMINOPHEN 5-325 MG PO TABS
1.0000 | ORAL_TABLET | Freq: Four times a day (QID) | ORAL | Status: DC | PRN
  Administered 2019-04-22 – 2019-04-25 (×10): 1 via ORAL
  Filled 2019-04-22 (×11): qty 1

## 2019-04-22 NOTE — ED Provider Notes (Signed)
Medical screening examination/treatment/procedure(s) were conducted as a shared visit with non-physician practitioner(s) and myself.  I personally evaluated the patient during the encounter. Briefly, the patient is a 84 y.o. female with history of Crohn's disease, hypertension, high cholesterol who presents to the ED following syncopal event.  Patient with overall unremarkable vitals.  Patient overall with poor health at baseline.  She states that she thinks she woke up around 3 AM needing to go to the bathroom.  She thinks that she may have been sitting on the toilet bowl and ended up somehow wedged between the toilet bowl in the bathtub.  Overall she is a poor historian.  She does not know if she lost consciousness or hit her head.  She has mostly low back pain and left hip pain.  She has not been able to ambulate since.  She denies any prodrome such as lightheadedness, nausea, vomiting before the fall.  She denies any chest pain, shortness of breath currently.  Patient does not appear to be on blood thinners.  She lives at home by herself.  She uses a walker and I believe a wheelchair at baseline.  Neurologically she appears intact.  EKG shows sinus rhythm.  No signs of ischemic changes.  Overall concern for possible arrhythmic event versus vasovagal event versus orthopedic injury from the fall.  Possibly dehydration, anemia, rhabdomyolysis.  Will give IV fluids and check labs and get images of head, neck, chest, low back and pelvis.  Given likely syncopal event and her age and risk factors likely will need admission for further syncope work-up and hydration.  Will reevaluate.  Imaging showed no acute findings.  Urinalysis negative.  CK elevated 800.  Patient overall with no significant leukocytosis, anemia, electrolyte abnormality.  Given high risk syncope admitted for further observation and work-up.  This chart was dictated using voice recognition software.  Despite best efforts to proofread,  errors can  occur which can change the documentation meaning.     EKG Interpretation  Date/Time:  Friday Apr 22 2019 09:32:31 EDT Ventricular Rate:  88 PR Interval:    QRS Duration: 90 QT Interval:  492 QTC Calculation: 596 R Axis:   91 Text Interpretation:  Sinus rhythm Right axis deviation Borderline repolarization abnormality Prolonged QT interval Confirmed by Lennice Sites [phone]) on 04/22/2019 9:33:38 AM           Lennice Sites, DO 04/22/19 1401

## 2019-04-22 NOTE — Progress Notes (Signed)
Pt admitted to room 32, placed on telemetry. Skin check done with Primitivo Gauze, RN: only issues are redness noted to right elbow. Pt has pain in low back - which is chronic - but now also has pain in right elbow, right ankle, neck. Placed on low bed with high fall risk precautions due to admission post fall. [place] on, draining light yellow urine. Pt is alert and oriented to person, place [facility]), but not to time. She knows she came in after a fall. She passed bedside swallow - no signs of issue with thin liquids or pudding, or crackers. Took pills whole in pudding.

## 2019-04-22 NOTE — H&P (Addendum)
History and Physical    HILIANA [name] XBD:[id number] DOB: 1935/08/27 DOA: 04/22/2019  Referring MD/NP/PA: Delia Heady, PA-C PCP: Marrian Salvage, [place]  Patient coming from: Home via EMS  Chief Complaint:   I have personally briefly reviewed patient's old medical records in [place]   HPI: Heather [name] is a 84 y.o. female with medical history significant of HTN, hypothyroidism, COPD, anxiety, chronic low back pain, and IBS; who presents after having a fall sometime last night.  History is limited as patient is a poor historian.  At baseline patient lives alone and utilizes a Programmer, multimedia to ambulate.  Last night she recalls waking up having to urinate urgently, but reports using her cane.  She went to the bathroom and somehow got stuck between the toilet and the shower.  Denied falling or lose of consciousness.  Patient currently states that she did not hit her head, but was recorded as stating so earlier.  Patient reports that if she did hit her head she likely would have broke the glass shower door.  Is unclear how long she was wedged in between tub and shower, but she utilized her life alert bracelet call for help.  Unclear how long she was down, but she does complain of feeling thirsty.  ED Course: Upon admission into the emergency department patient was noted to be afebrile, pulse 66 -01, respiration 10-19, blood pressure 136/87-178/69, and O2 saturation maintained on room air.  Labs were significant for WBC 16.5, potassium 3.4, and CK 800. Not found to have any significant signs of infection on urinalysis.  Chest x-ray showing stable right pulmonary nodule with surrounding fibrosis/scarring.  CT scans revealed no acute intracranial abnormality and note chronic changes in the lumbar spine. COVID-19 screening was negative.  Review of Systems  Constitutional: Negative for chills and fever.  HENT: Negative for congestion and nosebleeds.   Eyes: Negative for photophobia  and pain.  Respiratory: Positive for cough.   Cardiovascular: Negative for chest pain, palpitations and claudication.  Gastrointestinal: Negative for abdominal pain, nausea and vomiting.  Genitourinary: Negative for dysuria and hematuria.  Musculoskeletal: Positive for back pain and falls.  Neurological: Negative for focal weakness and loss of consciousness.  Psychiatric/Behavioral: Positive for memory loss. The patient is nervous/anxious.   All other systems reviewed and are negative.   Past Medical History:  Diagnosis Date   ANXIETY 07/23/2009   Atherosclerosis of aorta ([place]) 8/34/1962   Complication of anesthesia 7 or 8 yrs ago   woke up during colonscopy   COPD (chronic obstructive pulmonary disease) ([place])    Coronary atherosclerosis 07/24/2015   DEPRESSION 07/23/2009   DIVERTICULOSIS, COLON 07/23/2009   GERD 07/23/2009   Headache(784.0) 07/23/2009   occasional   Hemorrhoids    HIP PAIN, LEFT 05/16/2010   History of Crohn's disease    HYPERLIPIDEMIA 07/23/2009   HYPERTENSION 07/23/2009   HYPOTHYROIDISM 07/23/2009   IBS (irritable bowel syndrome)    Insomnia    LOW BACK PAIN, CHRONIC 10/01/2009   PARESTHESIA 10/01/2009   Shingles 2006   back    Past Surgical History:  Procedure Laterality Date   ABDOMINAL HYSTERECTOMY  age 46 or 35   APPENDECTOMY  yrs ago   BILATERAL SALPINGOOPHORECTOMY  age 8 or 24   COLONOSCOPY N/A 11/17/2013   Procedure: COLONOSCOPY;  Surgeon: Cleotis Nipper, MD;  Location: [facility];  Service: Endoscopy;  Laterality: N/A;   ESOPHAGOGASTRODUODENOSCOPY N/A 11/17/2013   Procedure: ESOPHAGOGASTRODUODENOSCOPY (EGD);  Surgeon: Youlanda Mighty Buccini,  MD;  Location: [facility];  Service: Endoscopy;  Laterality: N/A;   HEMORRHOID SURGERY  yrs ago   TONSILLECTOMY  yrs ago     reports that she quit smoking about 20 years ago. Her smoking use included cigarettes. She started smoking about 58 years ago. She smoked 0.50 packs per day.  She has never used smokeless tobacco. She reports that she does not drink alcohol or use drugs.  Allergies  Allergen Reactions   Tylenol [Acetaminophen] Other (See Comments)    Nightmares   Symbicort [Budesonide-Formoterol Fumarate] Other (See Comments)    Pt felt like her tongue was swollen, could not swallow    Family History  Problem Relation Age of Onset   Cancer Mother        Unknown type   Cancer Sister        Breast   Clotting disorder Neg Hx     Prior to Admission medications   Medication Sig Start Date End Date Taking? Authorizing Provider  Albuterol Sulfate (PROAIR RESPICLICK) 956 (90 Base) MCG/ACT AEPB Inhale 2 puffs into the lungs 4 (four) times daily as needed. Patient taking differently: Inhale 2 puffs into the lungs 4 (four) times daily as needed (sob and wheezing).  01/26/18  Yes [name], MD  clorazepate (TRANXENE) 15 MG tablet Take 1 tablet (15 mg total) by mouth 2 (two) times daily. Patient taking differently: Take 15 mg by mouth 2 (two) times daily as needed for anxiety.  01/19/19  Yes Marrian Salvage, FNP  diltiazem (CARDIZEM CD) 240 MG 24 hr capsule Take 1 capsule (240 mg total) by mouth daily. 12/14/18  Yes Marrian Salvage, FNP  esomeprazole (NEXIUM) 40 MG capsule Take 1 capsule (40 mg total) by mouth daily. 11/22/18  Yes Marrian Salvage, FNP  fluticasone furoate-vilanterol (BREO ELLIPTA) 200-25 MCG/INH AEPB Inhale 1 puff into the lungs daily. 11/26/18  Yes Marrian Salvage, FNP  folic acid (FOLVITE) 1 MG tablet TAKE 1 TABLET ONCE DAILY. Patient taking differently: Take 1 mg by mouth daily.  02/24/19  Yes Mosie Lukes, MD  furosemide (LASIX) 20 MG tablet TAKE 1 TABLET DAILY IF NEEDED FOR SIGNIFICANT LOWER EXTREMITY SWELLING. 01/19/19  Yes Marrian Salvage, FNP  levothyroxine (SYNTHROID, LEVOTHROID) 25 MCG tablet TAKE 1 TABLET DAILY BEFORE BREAKFAST. 03/02/19  Yes Marrian Salvage, FNP  sertraline (ZOLOFT) 50 MG  tablet TAKE 1 TABLET EACH DAY. Patient taking differently: Take 50 mg by mouth daily.  02/24/19  Yes Marrian Salvage, FNP  traMADol (ULTRAM) 50 MG tablet Take 1 tablet (50 mg total) by mouth every 6 (six) hours as needed. Patient taking differently: Take 50 mg by mouth every 6 (six) hours as needed for moderate pain.  01/19/19  Yes Marrian Salvage, FNP  traZODone (DESYREL) 50 MG tablet TAKE 1/2 TO 1 TABLET AT BEDTIME AS NEEDED FOR REST. Patient taking differently: Take 25-50 mg by mouth at bedtime as needed for sleep.  02/21/19  Yes Marrian Salvage, FNP  zolpidem (AMBIEN) 5 MG tablet TAKE 1 TABLET AT BEDTIME AS NEEDED. Patient taking differently: Take 5 mg by mouth at bedtime as needed for sleep.  02/24/19  Yes Marrian Salvage, FNP  benzonatate (TESSALON) 100 MG capsule TAKE 1 CAPSULE TWICE DAILY AS NEEDED FOR COUGH. Patient not taking: Reported on 04/22/2019 04/21/18   [name], MD  HYDROcodone-acetaminophen (NORCO/VICODIN) 5-325 MG tablet TAKE 1 TABLET EVERY 6-8 HOURS AS NEEDED FOR SEVERE PAIN NOT CONTROLLED BY TRAMADOL.  02/21/19   Marrian Salvage, FNP  Multiple Vitamin (MULTIVITAMIN WITH MINERALS) TABS tablet Take 1 tablet by mouth daily. 03/14/18   Roxan Hockey, MD  Polyethyl Glycol-Propyl Glycol (SYSTANE OP) Place 1 drop into both eyes 2 (two) times daily as needed (for dry eyes).     [provider]  promethazine (PHENERGAN) 25 MG tablet Take 1 tablet (25 mg total) by mouth every 8 (eight) hours as needed for nausea or vomiting. Patient not taking: Reported on 04/22/2019 01/19/19   Marrian Salvage, FNP  senna-docusate (SENOKOT-S) 8.6-50 MG tablet Take 2 tablets by mouth at bedtime. Patient not taking: Reported on 04/22/2019 03/13/18   Roxan Hockey, MD    Physical Exam:  Constitutional: Elderly female who appears no acute distress Vitals:   04/22/19 1145 04/22/19 1200 04/22/19 1230 04/22/19 1300  BP:  (!) 154/69 (!) 178/69 (!)  165/67  Pulse: 73 72 75 66  Resp: 13 13 10 13   Temp:      TempSrc:      SpO2: 97% 98% 96% 96%   Eyes: PERRL, lids and conjunctivae normal ENMT: Mucous membranes are dry. Posterior pharynx clear of any exudate or lesions.  Neck: normal, supple, no masses, no thyromegaly Respiratory: clear to auscultation bilaterally, no wheezing, no crackles. Normal respiratory effort. No accessory muscle use.  Cardiovascular: Regular rate and rhythm, no murmurs / rubs / gallops. No extremity edema. 2+ pedal pulses. No carotid bruits.  Abdomen: no tenderness, no masses palpated. No hepatosplenomegaly. Bowel sounds positive.  Musculoskeletal: no clubbing / cyanosis. No joint deformity upper and lower extremities.  Skin: no rashes, lesions, ulcers. No induration Neurologic: CN 2-12 grossly intact.  Able to move all extremities.  Tremor present. Psychiatric: Poor memory. Alert and oriented x 2. Normal mood.     Labs on Admission: I have personally reviewed following labs and imaging studies  CBC: Recent Labs  Lab 04/22/19 0959  WBC 16.5*  NEUTROABS 14.3*  HGB 12.2  HCT 39.6  MCV 85.9  PLT 440   Basic Metabolic Panel: Recent Labs  Lab 04/22/19 0959  NA 141  K 3.4*  CL 100  CO2 29  GLUCOSE 137*  BUN 11  CREATININE 0.97  CALCIUM 10.1   GFR: CrCl cannot be calculated (Unknown ideal weight.). Liver Function Tests: Recent Labs  Lab 04/22/19 0959  AST 48*  ALT 21  ALKPHOS 102  BILITOT 0.9  PROT 7.8  ALBUMIN 4.8   No results for input(s): LIPASE, AMYLASE in the last 168 hours. No results for input(s): AMMONIA in the last 168 hours. Coagulation Profile: No results for input(s): INR, PROTIME in the last 168 hours. Cardiac Enzymes: Recent Labs  Lab 04/22/19 0959  CKTOTAL 800*  TROPONINI <0.03   BNP (last 3 results) Recent Labs    05/31/18 1646  PROBNP 68.0   HbA1C: No results for input(s): HGBA1C in the last 72 hours. CBG: No results for input(s): GLUCAP in the last 168  hours. Lipid Profile: No results for input(s): CHOL, HDL, LDLCALC, TRIG, CHOLHDL, LDLDIRECT in the last 72 hours. Thyroid Function Tests: No results for input(s): TSH, T4TOTAL, FREET4, T3FREE, THYROIDAB in the last 72 hours. Anemia Panel: No results for input(s): VITAMINB12, FOLATE, FERRITIN, TIBC, IRON, RETICCTPCT in the last 72 hours. Urine analysis:    Component Value Date/Time   COLORURINE STRAW (A) 04/22/2019 [place] 04/22/2019 1138   LABSPEC 1.010 04/22/2019 1138   PHURINE 6.0 04/22/2019 1138   [place] 04/22/2019 1138  HGBUR TRACE (A) 04/22/2019 1138   HGBUR trace-lysed 11/15/2010 [place] 04/22/2019 [place] 04/22/2019 1138   PROTEINUR NEGATIVE 04/22/2019 1138   UROBILINOGEN 0.2 08/22/2014 1105   NITRITE NEGATIVE 04/22/2019 [place] 04/22/2019 1138   Sepsis Labs: No results found for this or any previous visit (from the past 240 hour(s)).   Radiological Exams on Admission: Ct Head Wo Contrast  Result Date: 04/22/2019 CLINICAL DATA:  Pt arrives from home via EMS. EMS reports that pt was getting up from the toilet and looked like her hand might have slipped and she was halfway stuck between toilet and bathtub. Pain is reported. EXAM: CT HEAD WITHOUT CONTRAST CT CERVICAL SPINE WITHOUT CONTRAST TECHNIQUE: Multidetector CT imaging of the head and cervical spine was performed following the standard protocol without intravenous contrast. Multiplanar CT image reconstructions of the cervical spine were also generated. COMPARISON:  CT head 08/13/2014. FINDINGS: CT HEAD FINDINGS Brain: No evidence for acute infarction, hemorrhage, mass lesion, hydrocephalus, or extra-axial fluid. Generalized atrophy. Moderate chronic microvascular ischemic change. Vascular: Calcification of the cavernous internal carotid arteries consistent with cerebrovascular atherosclerotic disease. No signs of intracranial large vessel  occlusion. Skull: Normal. Negative for fracture or focal lesion. Sinuses/Orbits: No acute finding. Other: None. CT CERVICAL SPINE FINDINGS Alignment: Slight straightening of the normal cervical lordosis. No traumatic subluxation. 2 mm of degenerative anterolisthesis C7-T1 is facet mediated. Skull base and vertebrae: No acute fracture. No primary bone lesion or focal pathologic process. Soft tissues and spinal canal: No prevertebral fluid or swelling. No visible canal hematoma. Disc levels: C2-3:  Normal. C3-4:  Disc space narrowing. No impingement. C4-5:  Unremarkable. C5-6: Disc space narrowing. Osseous spurring. BILATERAL C6 foraminal narrowing. C6-7: Near complete loss of interspace height. Osseous spurring. BILATERAL C7 foraminal narrowing. C7-T1: 2 mm anterolisthesis. Facet arthropathy. No definite impingement. Upper chest: No mass or pneumothorax. Emphysema. Other: None. IMPRESSION: 1. No skull fracture or intracranial hemorrhage. Atrophy and small vessel disease. 2. No cervical spine fracture or traumatic subluxation. Multilevel spondylosis. Electronically Signed   By: Staci Righter M.D.   On: 04/22/2019 10:57   Ct Cervical Spine Wo Contrast  Result Date: 04/22/2019 CLINICAL DATA:  Pt arrives from home via EMS. EMS reports that pt was getting up from the toilet and looked like her hand might have slipped and she was halfway stuck between toilet and bathtub. Pain is reported. EXAM: CT HEAD WITHOUT CONTRAST CT CERVICAL SPINE WITHOUT CONTRAST TECHNIQUE: Multidetector CT imaging of the head and cervical spine was performed following the standard protocol without intravenous contrast. Multiplanar CT image reconstructions of the cervical spine were also generated. COMPARISON:  CT head 08/13/2014. FINDINGS: CT HEAD FINDINGS Brain: No evidence for acute infarction, hemorrhage, mass lesion, hydrocephalus, or extra-axial fluid. Generalized atrophy. Moderate chronic microvascular ischemic change. Vascular:  Calcification of the cavernous internal carotid arteries consistent with cerebrovascular atherosclerotic disease. No signs of intracranial large vessel occlusion. Skull: Normal. Negative for fracture or focal lesion. Sinuses/Orbits: No acute finding. Other: None. CT CERVICAL SPINE FINDINGS Alignment: Slight straightening of the normal cervical lordosis. No traumatic subluxation. 2 mm of degenerative anterolisthesis C7-T1 is facet mediated. Skull base and vertebrae: No acute fracture. No primary bone lesion or focal pathologic process. Soft tissues and spinal canal: No prevertebral fluid or swelling. No visible canal hematoma. Disc levels: C2-3:  Normal. C3-4:  Disc space narrowing. No impingement. C4-5:  Unremarkable. C5-6: Disc space narrowing. Osseous spurring. BILATERAL C6 foraminal narrowing.  C6-7: Near complete loss of interspace height. Osseous spurring. BILATERAL C7 foraminal narrowing. C7-T1: 2 mm anterolisthesis. Facet arthropathy. No definite impingement. Upper chest: No mass or pneumothorax. Emphysema. Other: None. IMPRESSION: 1. No skull fracture or intracranial hemorrhage. Atrophy and small vessel disease. 2. No cervical spine fracture or traumatic subluxation. Multilevel spondylosis. Electronically Signed   By: Staci Righter M.D.   On: 04/22/2019 10:57   Ct Lumbar Spine Wo Contrast  Result Date: 04/22/2019 CLINICAL DATA:  Pt arrives from home via EMS. EMS reports that pt was getting up from the toilet and looked like her hand might have slipped and she was halfway stuck between toilet and bathtub. Pain is reported. EXAM: CT LUMBAR SPINE WITHOUT CONTRAST TECHNIQUE: Multidetector CT imaging of the lumbar spine was performed without intravenous contrast administration. Multiplanar CT image reconstructions were also generated. COMPARISON:  CT of the abdomen and pelvis with sagittal reformatted images, 04/12/2018. FINDINGS: Segmentation: 5 lumbar type vertebrae. Alignment: Trace retrolisthesis L3-4,  nontraumatic. Vertebrae: Chronic endplate deformity inferior L3, unchanged from 2019. Paraspinal and other soft tissues: Incompletely evaluated, but clearly worsened, RIGHT lower lobe process, either pneumonia or tumor progression. Disc levels: No visible traumatic disc herniation. Moderate multifactorial spinal stenosis at L4-5 related to posterior element hypertrophy, and central disc protrusion. Additionally, central and leftward protrusion is noted L5-S1 with asymmetric facet arthropathy. IMPRESSION: 1. Moderate multifactorial spinal stenosis at L4-5 related to posterior element hypertrophy, and central disc protrusion; additional central and leftward protrusion at L5-S1. Neither appear posttraumatic. 2. Chronic endplate deformity L3. No lumbar compression fracture or traumatic subluxation. 3. Incompletely evaluated, but clearly worsened RIGHT lower lobe process, either pneumonia or tumor progression. Electronically Signed   By: Staci Righter M.D.   On: 04/22/2019 10:45   Dg Pelvis Portable  Result Date: 04/22/2019 CLINICAL DATA:  Fall. EXAM: PORTABLE PELVIS 1-2 VIEWS COMPARISON:  None. FINDINGS: There is no evidence of pelvic fracture or diastasis. No pelvic bone lesions are seen. IMPRESSION: Negative. Electronically Signed   By: Marijo Conception M.D.   On: 04/22/2019 10:37   Dg Chest Portable 1 View  Result Date: 04/22/2019 CLINICAL DATA:  Fall. EXAM: PORTABLE CHEST 1 VIEW COMPARISON:  Radiographs of January 19, 2019. CT scan of February 18, 2019. FINDINGS: The heart size and mediastinal contours are within normal limits. No pneumothorax or pleural effusion is noted. Left lung is clear. Stable nodular density is noted in right lower lobe consistent with neoplasm and associated fibrosis or scarring. The visualized skeletal structures are unremarkable. IMPRESSION: Stable right lower lobe nodule with surrounding fibrosis or scarring. No traumatic abnormality seen in the chest. Electronically Signed   By:  Marijo Conception M.D.   On: 04/22/2019 11:46    EKG: Independently reviewed.  Sinus rhythm at 80 bpm with significant background artifact noted.  Assessment/Plan Fall: Acute.  Patient noted to have somehow fallen between her toilet and commode.  Imaging studies showed no acute fractures.  Possible causes for fall include mechanical, syncope, or arrhythmia. -Admit to a telemetry bed -Physical therapy/Occupational Therapy to evaluate and treat her safety at home -Follow-up telemetry overnight  Elevated CK: Acute.  Creatinine kinase mildly elevated at 800 with a subtle signs of renal insufficiency has baseline creatinine appears around 0.6-0.7 and creatinine up to 0.9.  Suspect secondary to the fall.  Patient was given 1 L normal saline IV fluids. -Recheck CK and kidney function  in a.m.   Leukocytosis: Acute.  WBC elevated at 16.5.  Urinalysis was  not significant for signs of infection.  Chest x-ray showing stable right lower lobe nodule with surrounding fibrosis/scarring -Recheck CBC in a.m.   Hypokalemia: Acute.  Potassium mildly low at 3.4. -Give 20 mEq of potassium chloride x1 dose -Continue to monitor and replace as needed  Non-small cell lung cancer: Patient with history of stage I non-small cell lung cancer diagnosed in 10/2018.  Patient was noted not to be a surgical candidate for resection and underwent 3 rounds of radiation therapy. -Continue outpatient follow-up and management  Prolonged QT interval: QT interval noted to be abnormal, but there was a lot of background artifact on initial ECG -Recheck EKG  Chronic back pain, spinal stenosis: Chronic.  No acute injuries noted on imaging. -Hydrocodone as needed for back pain  COPD, without exacerbation -Continue Breo -Albuterol nebs as needed for shortness of breath/wheezing  Hypothyroidism: TSH last noted to be within normal limits in 06/2018. -Check TSH -Continue levothyroxine  Anxiety -Continue clorazepate -Restart  Zoloft when medically appropriate   DVT prophylaxis:  lovenox  Code Status: Full  Family Communication: Discussed with son over the phone Disposition Plan: Possible discharge home in a.m. if work-up negative Consults called: none Admission status: observation  Norval Morton MD [facility] Pager [phone]   If 7PM-7AM, please contact night-coverage [email or web address] Password [facility]  04/22/2019, 2:14 PM

## 2019-04-22 NOTE — ED Notes (Signed)
ED TO INPATIENT HANDOFF REPORT  ED Nurse Name and Phone #: Ethelle Lyon, Shelly Bombard [phone]   S Name/Age/Gender Heather [name] 84 y.o. female Room/Bed: 022C/022C  Code Status   Code Status: Prior  Home/SNF/Other Home Patient oriented to: self and situation Is this baseline? Yes   Triage Complete: Triage complete  Chief Complaint fall  Triage Note Pt arrives from home via EMS. EMS reports that pt was getting up from the toilet and looked like her hand might have slipped and she was halfway stuck between toilet and bathtub. Pt never fell all the way to ground just was stuck leaning over between bathtub and toilet.  EMS reports A&O x4 Bruising mid lower back  Pt lives alone at home and pressed her medical alert bracelet to call EMS.   Allergies Allergies  Allergen Reactions  . Tylenol [Acetaminophen] Other (See Comments)    Nightmares  . Symbicort [Budesonide-Formoterol Fumarate] Other (See Comments)    Pt felt like her tongue was swollen, could not swallow    Level of Care/Admitting Diagnosis ED Disposition    ED Disposition Condition Comment   Admit  The patient appears reasonably stabilized for admission considering the current resources, flow, and capabilities available in the ED at this time, and I doubt any other [facility] requiring further screening and/or treatment in the ED prior to admission is  present.       B Medical/Surgery History Past Medical History:  Diagnosis Date  . ANXIETY 07/23/2009  . Atherosclerosis of aorta ([place]) 07/24/2015  . Complication of anesthesia 7 or 8 yrs ago   woke up during colonscopy  . COPD (chronic obstructive pulmonary disease) ([place])   . Coronary atherosclerosis 07/24/2015  . DEPRESSION 07/23/2009  . DIVERTICULOSIS, COLON 07/23/2009  . GERD 07/23/2009  . Headache(784.0) 07/23/2009   occasional  . Hemorrhoids   . HIP PAIN, LEFT 05/16/2010  . History of Crohn's disease   . HYPERLIPIDEMIA 07/23/2009  . HYPERTENSION 07/23/2009  .  HYPOTHYROIDISM 07/23/2009  . IBS (irritable bowel syndrome)   . Insomnia   . LOW BACK PAIN, CHRONIC 10/01/2009  . PARESTHESIA 10/01/2009  . Shingles 2006   back   Past Surgical History:  Procedure Laterality Date  . ABDOMINAL HYSTERECTOMY  age 74 or 54  . APPENDECTOMY  yrs ago  . BILATERAL SALPINGOOPHORECTOMY  age 80 or 50  . COLONOSCOPY N/A 11/17/2013   Procedure: COLONOSCOPY;  Surgeon: Cleotis Nipper, MD;  Location: [facility] ENDOSCOPY;  Service: Endoscopy;  Laterality: N/A;  . ESOPHAGOGASTRODUODENOSCOPY N/A 11/17/2013   Procedure: ESOPHAGOGASTRODUODENOSCOPY (EGD);  Surgeon: Cleotis Nipper, MD;  Location: [facility];  Service: Endoscopy;  Laterality: N/A;  . HEMORRHOID SURGERY  yrs ago  . TONSILLECTOMY  yrs ago     A IV Location/Drains/Wounds Patient Lines/Drains/Airways Status   Active Line/Drains/Airways    Name:   Placement date:   Placement time:   Site:   Days:   Peripheral IV 04/22/19 Right Antecubital   04/22/19    1000    Antecubital   less than 1          Intake/Output Last 24 hours No intake or output data in the 24 hours ending 04/22/19 1311  Labs/Imaging Results for orders placed or performed during the hospital encounter of 04/22/19 (from the past 48 hour(s))  CK     Status: Abnormal   Collection Time: 04/22/19  9:59 AM  Result Value Ref Range   Total CK 800 (H) 38 - 234 U/L  Comment: Performed at [facility], [place] [address] [place], [place] [address]  Comprehensive metabolic panel     Status: Abnormal   Collection Time: 04/22/19  9:59 AM  Result Value Ref Range   Sodium 141 135 - 145 mmol/L   Potassium 3.4 (L) 3.5 - 5.1 mmol/L   Chloride 100 98 - 111 mmol/L   CO2 29 22 - 32 mmol/L   Glucose, Bld 137 (H) 70 - 99 mg/dL   BUN 11 8 - 23 mg/dL   Creatinine, Ser 0.97 0.44 - 1.00 mg/dL   Calcium 10.1 8.9 - 10.3 mg/dL   Total Protein 7.8 6.5 - 8.1 g/dL   Albumin 4.8 3.5 - 5.0 g/dL   AST 48 (H) 15 - 41 U/L   ALT 21 0 - 44 U/L   Alkaline  Phosphatase 102 38 - 126 U/L   Total Bilirubin 0.9 0.3 - 1.2 mg/dL   GFR calc non Af Amer 55 (L) >60 mL/min   GFR calc Af Amer >60 >60 mL/min   Anion gap 12 5 - 15    Comment: Performed at [facility], [place] [address], [place], [place] [address]  CBC with Differential     Status: Abnormal   Collection Time: 04/22/19  9:59 AM  Result Value Ref Range   WBC 16.5 (H) 4.0 - 10.5 K/uL   RBC 4.61 3.87 - 5.11 MIL/uL   Hemoglobin 12.2 12.0 - 15.0 g/dL   HCT 39.6 36.0 - 46.0 %   MCV 85.9 80.0 - 100.0 fL   MCH 26.5 26.0 - 34.0 pg   MCHC 30.8 30.0 - 36.0 g/dL   RDW 14.3 11.5 - 15.5 %   Platelets 357 150 - 400 K/uL   nRBC 0.0 0.0 - 0.2 %   Neutrophils Relative % 86 %   Neutro Abs 14.3 (H) 1.7 - 7.7 K/uL   Lymphocytes Relative 4 %   Lymphs Abs 0.7 0.7 - 4.0 K/uL   Monocytes Relative 9 %   Monocytes Absolute 1.4 (H) 0.1 - 1.0 K/uL   Eosinophils Relative 0 %   Eosinophils Absolute 0.0 0.0 - 0.5 K/uL   Basophils Relative 0 %   Basophils Absolute 0.0 0.0 - 0.1 K/uL   Immature Granulocytes 1 %   Abs Immature Granulocytes 0.08 (H) 0.00 - 0.07 K/uL    Comment: Performed at [facility] [address] [place], [place] [address]  Troponin I - Once     Status: None   Collection Time: 04/22/19  9:59 AM  Result Value Ref Range   Troponin I <0.03 <0.03 ng/mL    Comment: Performed at [facility] [address], [place], [place] [address]  Urinalysis, Routine w reflex microscopic     Status: Abnormal   Collection Time: 04/22/19 11:38 AM  Result Value Ref Range   Color, Urine STRAW (A) YELLOW   APPearance CLEAR CLEAR   Specific Gravity, Urine 1.010 1.005 - 1.030   pH 6.0 5.0 - 8.0   Glucose, UA NEGATIVE NEGATIVE mg/dL   Hgb urine dipstick TRACE (A) NEGATIVE   Bilirubin Urine NEGATIVE NEGATIVE   Ketones, ur NEGATIVE NEGATIVE mg/dL   Protein, ur NEGATIVE NEGATIVE mg/dL   Nitrite NEGATIVE NEGATIVE   Leukocytes,Ua NEGATIVE NEGATIVE    Comment: Performed at [facility] [address], [place],  [address]  Urinalysis, Microscopic (reflex)     Status: Abnormal   Collection Time: 04/22/19 11:38 AM  Result Value Ref Range   RBC / HPF 0-5 0 - 5 RBC/hpf   WBC, UA 0-5 0 - 5 WBC/hpf   Bacteria, UA RARE (A) NONE SEEN   Squamous Epithelial / LPF 0-5 0 - 5   Mucus PRESENT     Comment: Performed at [facility], [place] [address] [place], [place] [address]   Ct Head Wo Contrast  Result Date: 04/22/2019 CLINICAL DATA:  Pt arrives from home via EMS. EMS reports that pt was getting up from the toilet and looked like her hand might have slipped and she was halfway stuck between toilet and bathtub. Pain is reported. EXAM: CT HEAD WITHOUT CONTRAST CT CERVICAL SPINE WITHOUT CONTRAST TECHNIQUE: Multidetector CT imaging of the head and cervical spine was performed following the standard protocol without intravenous contrast. Multiplanar CT image reconstructions of the cervical spine were also generated. COMPARISON:  CT head 08/13/2014. FINDINGS: CT HEAD FINDINGS Brain: No evidence for acute infarction, hemorrhage, mass lesion, hydrocephalus, or extra-axial fluid. Generalized atrophy. Moderate chronic microvascular ischemic change. Vascular: Calcification of the cavernous internal carotid arteries consistent with cerebrovascular atherosclerotic disease. No signs of intracranial large vessel occlusion. Skull: Normal. Negative for fracture or focal lesion. Sinuses/Orbits: No acute finding. Other: None. CT CERVICAL SPINE FINDINGS Alignment: Slight straightening of the normal cervical lordosis. No traumatic subluxation. 2 mm of degenerative anterolisthesis C7-T1 is facet mediated. Skull base and vertebrae: No acute fracture. No primary bone lesion or focal pathologic process. Soft tissues and spinal canal: No prevertebral fluid or swelling. No visible canal hematoma. Disc levels: C2-3:  Normal. C3-4:  Disc space narrowing. No impingement. C4-5:  Unremarkable. C5-6: Disc space  narrowing. Osseous spurring. BILATERAL C6 foraminal narrowing. C6-7: Near complete loss of interspace height. Osseous spurring. BILATERAL C7 foraminal narrowing. C7-T1: 2 mm anterolisthesis. Facet arthropathy. No definite impingement. Upper chest: No mass or pneumothorax. Emphysema. Other: None. IMPRESSION: 1. No skull fracture or intracranial hemorrhage. Atrophy and small vessel disease. 2. No cervical spine fracture or traumatic subluxation. Multilevel spondylosis. Electronically Signed   By: Staci Righter M.D.   On: 04/22/2019 10:57   Ct Cervical Spine Wo Contrast  Result Date: 04/22/2019 CLINICAL DATA:  Pt arrives from home via EMS. EMS reports that pt was getting up from the toilet and looked like her hand might have slipped and she was halfway stuck between toilet and bathtub. Pain is reported. EXAM: CT HEAD WITHOUT CONTRAST CT CERVICAL SPINE WITHOUT CONTRAST TECHNIQUE: Multidetector CT imaging of the head and cervical spine was performed following the standard protocol without intravenous contrast. Multiplanar CT image reconstructions of the cervical spine were also generated. COMPARISON:  CT head 08/13/2014. FINDINGS: CT HEAD FINDINGS Brain: No evidence for acute infarction, hemorrhage, mass lesion, hydrocephalus, or extra-axial fluid. Generalized atrophy. Moderate chronic microvascular ischemic change. Vascular: Calcification of the cavernous internal carotid arteries consistent with cerebrovascular atherosclerotic disease. No signs of intracranial large vessel occlusion. Skull: Normal. Negative for fracture or focal lesion. Sinuses/Orbits: No acute finding. Other: None. CT CERVICAL SPINE FINDINGS Alignment: Slight straightening of the normal cervical lordosis. No traumatic subluxation. 2 mm of degenerative anterolisthesis C7-T1 is facet mediated. Skull base and vertebrae: No acute fracture. No primary bone lesion or focal pathologic process. Soft tissues and spinal canal: No prevertebral fluid or  swelling. No visible canal hematoma. Disc levels: C2-3:  Normal. C3-4:  Disc space narrowing. No impingement. C4-5:  Unremarkable. C5-6: Disc space narrowing. Osseous spurring. BILATERAL C6 foraminal narrowing. C6-7: Near complete loss of interspace  height. Osseous spurring. BILATERAL C7 foraminal narrowing. C7-T1: 2 mm anterolisthesis. Facet arthropathy. No definite impingement. Upper chest: No mass or pneumothorax. Emphysema. Other: None. IMPRESSION: 1. No skull fracture or intracranial hemorrhage. Atrophy and small vessel disease. 2. No cervical spine fracture or traumatic subluxation. Multilevel spondylosis. Electronically Signed   By: Staci Righter M.D.   On: 04/22/2019 10:57   Ct Lumbar Spine Wo Contrast  Result Date: 04/22/2019 CLINICAL DATA:  Pt arrives from home via EMS. EMS reports that pt was getting up from the toilet and looked like her hand might have slipped and she was halfway stuck between toilet and bathtub. Pain is reported. EXAM: CT LUMBAR SPINE WITHOUT CONTRAST TECHNIQUE: Multidetector CT imaging of the lumbar spine was performed without intravenous contrast administration. Multiplanar CT image reconstructions were also generated. COMPARISON:  CT of the abdomen and pelvis with sagittal reformatted images, 04/12/2018. FINDINGS: Segmentation: 5 lumbar type vertebrae. Alignment: Trace retrolisthesis L3-4, nontraumatic. Vertebrae: Chronic endplate deformity inferior L3, unchanged from 2019. Paraspinal and other soft tissues: Incompletely evaluated, but clearly worsened, RIGHT lower lobe process, either pneumonia or tumor progression. Disc levels: No visible traumatic disc herniation. Moderate multifactorial spinal stenosis at L4-5 related to posterior element hypertrophy, and central disc protrusion. Additionally, central and leftward protrusion is noted L5-S1 with asymmetric facet arthropathy. IMPRESSION: 1. Moderate multifactorial spinal stenosis at L4-5 related to posterior element  hypertrophy, and central disc protrusion; additional central and leftward protrusion at L5-S1. Neither appear posttraumatic. 2. Chronic endplate deformity L3. No lumbar compression fracture or traumatic subluxation. 3. Incompletely evaluated, but clearly worsened RIGHT lower lobe process, either pneumonia or tumor progression. Electronically Signed   By: Staci Righter M.D.   On: 04/22/2019 10:45   Dg Pelvis Portable  Result Date: 04/22/2019 CLINICAL DATA:  Fall. EXAM: PORTABLE PELVIS 1-2 VIEWS COMPARISON:  None. FINDINGS: There is no evidence of pelvic fracture or diastasis. No pelvic bone lesions are seen. IMPRESSION: Negative. Electronically Signed   By: Marijo Conception M.D.   On: 04/22/2019 10:37   Dg Chest Portable 1 View  Result Date: 04/22/2019 CLINICAL DATA:  Fall. EXAM: PORTABLE CHEST 1 VIEW COMPARISON:  Radiographs of January 19, 2019. CT scan of February 18, 2019. FINDINGS: The heart size and mediastinal contours are within normal limits. No pneumothorax or pleural effusion is noted. Left lung is clear. Stable nodular density is noted in right lower lobe consistent with neoplasm and associated fibrosis or scarring. The visualized skeletal structures are unremarkable. IMPRESSION: Stable right lower lobe nodule with surrounding fibrosis or scarring. No traumatic abnormality seen in the chest. Electronically Signed   By: Marijo Conception M.D.   On: 04/22/2019 11:46    Pending Labs Unresulted Labs (From admission, onward)    Start     Ordered   04/22/19 0948  Urine culture  ONCE - STAT,   STAT     04/22/19 0948          Vitals/Pain Today's Vitals   04/22/19 1130 04/22/19 1145 04/22/19 1200 04/22/19 1230  BP: (!) 149/73  (!) 154/69 (!) 178/69  Pulse: 80 73 72 75  Resp: 10 13 13 10   Temp:      TempSrc:      SpO2: 95% 97% 98% 96%  PainSc:        Isolation Precautions No active isolations  Medications Medications  sodium chloride 0.9 % bolus 1,000 mL (1,000 mLs Intravenous New  Bag/Given 04/22/19 1128)    Mobility walks with device High  fall risk   Focused Assessments Cardiac Assessment Handoff:    Lab Results  Component Value Date   CKTOTAL 800 (H) 04/22/2019   CKMB 4.6 (H) 08/14/2014   TROPONINI <0.03 04/22/2019   Lab Results  Component Value Date   DDIMER 0.33 02/19/2018   Does the Patient currently have chest pain? No     R Recommendations: See Admitting Provider Note  Report given to:   Additional Notes:.

## 2019-04-22 NOTE — ED Notes (Signed)
Nurse Navigator communication: Heather [name] son and HPOA with communication about his mothers status and admission. He was very grateful to hear his mothers voice. He has been given the room number and contact numbers for the unit.

## 2019-04-22 NOTE — ED Notes (Signed)
Nurse Navigator communication: The patient reports that her son is aware of where she is but not that she is staying. Assisted with bedpan.   Attending Dr. Tamala Julian at beside at this time.

## 2019-04-22 NOTE — ED Provider Notes (Signed)
[facility] EMERGENCY DEPARTMENT Provider Note   CSN: [id number] Arrival date & time: 04/22/19  4193    History   Chief Complaint Chief Complaint  Patient presents with   Back Pain    HPI Heather [name] is a 84 y.o. female with a past medical history of hypertension, hyperlipidemia, IBS, chronic lower back pain, COPD presents to ED for fall that occurred at an unknown time.  History is limited secondary to patient's mental status, unsure if this is her baseline.  Per EMS and patient's report she was using the bathroom sometime approximately 7 hours ago.  It appears that she got stuck between the toilet and the bathtub but denies hitting the floor.  However, she then stated that she did hit the floor with her head and is complaining of pain in her head as well as her lower back.  She cannot recall what caused her to fall.  States that she lives alone and has a medical alert bracelet which she used to contact EMS.  He usually ambulates with walker, cane and wheelchair. She complains of a dry mouth and feeling thirsty.     HPI  Past Medical History:  Diagnosis Date   ANXIETY 07/23/2009   Atherosclerosis of aorta ([place]) 7/90/2409   Complication of anesthesia 7 or 8 yrs ago   woke up during colonscopy   COPD (chronic obstructive pulmonary disease) ([place])    Coronary atherosclerosis 07/24/2015   DEPRESSION 07/23/2009   DIVERTICULOSIS, COLON 07/23/2009   GERD 07/23/2009   Headache(784.0) 07/23/2009   occasional   Hemorrhoids    HIP PAIN, LEFT 05/16/2010   History of Crohn's disease    HYPERLIPIDEMIA 07/23/2009   HYPERTENSION 07/23/2009   HYPOTHYROIDISM 07/23/2009   IBS (irritable bowel syndrome)    Insomnia    LOW BACK PAIN, CHRONIC 10/01/2009   PARESTHESIA 10/01/2009   Shingles 2006   back    Patient Active Problem List   Diagnosis Date Noted   Malignant neoplasm of lower lobe of right lung ([place]) 11/16/2018   Dehydration    Abdominal  pain 02/27/2018   Colitis 02/27/2018   SBO (small bowel obstruction) ([place]) 02/27/2018   Hypokalemia 02/19/2018   Oral thrush 01/29/2018   Malnutrition of moderate degree 01/29/2018   COPD with acute exacerbation ([place]) 01/28/2018   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   COPD exacerbation ([place]) 05/29/2016   Hypoxia 05/29/2016   Coronary atherosclerosis 07/24/2015   Atherosclerosis of aorta ([place]) 07/24/2015   Dysphagia 07/24/2015   Acute respiratory failure with hypoxia ([place]) 07/23/2015   Bronchospasm with bronchitis, acute 07/23/2015   HIP PAIN, LEFT 05/16/2010   LOW BACK PAIN, CHRONIC 10/01/2009   PARESTHESIA 10/01/2009   Hypothyroidism 07/23/2009   Dyslipidemia 07/23/2009   Anxiety state 07/23/2009   Anxiety and depression 07/23/2009   Essential hypertension 07/23/2009   GERD 07/23/2009   DIVERTICULOSIS, COLON 07/23/2009   HEADACHE 07/23/2009    Past Surgical History:  Procedure Laterality Date   ABDOMINAL HYSTERECTOMY  age 45 or 89   APPENDECTOMY  yrs ago   BILATERAL SALPINGOOPHORECTOMY  age 44 or 19   COLONOSCOPY N/A 11/17/2013   Procedure: COLONOSCOPY;  Surgeon: Cleotis Nipper, MD;  Location: [facility] ENDOSCOPY;  Service: Endoscopy;  Laterality: N/A;   ESOPHAGOGASTRODUODENOSCOPY N/A 11/17/2013   Procedure: ESOPHAGOGASTRODUODENOSCOPY (EGD);  Surgeon: Cleotis Nipper, MD;  Location: [facility];  Service: Endoscopy;  Laterality: N/A;   HEMORRHOID SURGERY  yrs ago   TONSILLECTOMY  yrs ago  OB History   No obstetric history on file.      Home Medications    Prior to Admission medications   Medication Sig Start Date End Date Taking? Authorizing Provider  Albuterol Sulfate (PROAIR RESPICLICK) 073 (90 Base) MCG/ACT AEPB Inhale 2 puffs into the lungs 4 (four) times daily as needed. Patient taking differently: Inhale 2 puffs into the lungs 4 (four) times daily as needed (sob and wheezing).  01/26/18  Yes [name], MD    clorazepate (TRANXENE) 15 MG tablet Take 1 tablet (15 mg total) by mouth 2 (two) times daily. Patient taking differently: Take 15 mg by mouth 2 (two) times daily as needed for anxiety.  01/19/19  Yes Marrian Salvage, FNP  diltiazem (CARDIZEM CD) 240 MG 24 hr capsule Take 1 capsule (240 mg total) by mouth daily. 12/14/18  Yes Marrian Salvage, FNP  esomeprazole (NEXIUM) 40 MG capsule Take 1 capsule (40 mg total) by mouth daily. 11/22/18  Yes Marrian Salvage, FNP  fluticasone furoate-vilanterol (BREO ELLIPTA) 200-25 MCG/INH AEPB Inhale 1 puff into the lungs daily. 11/26/18  Yes Marrian Salvage, FNP  folic acid (FOLVITE) 1 MG tablet TAKE 1 TABLET ONCE DAILY. Patient taking differently: Take 1 mg by mouth daily.  02/24/19  Yes Mosie Lukes, MD  furosemide (LASIX) 20 MG tablet TAKE 1 TABLET DAILY IF NEEDED FOR SIGNIFICANT LOWER EXTREMITY SWELLING. 01/19/19  Yes Marrian Salvage, FNP  levothyroxine (SYNTHROID, LEVOTHROID) 25 MCG tablet TAKE 1 TABLET DAILY BEFORE BREAKFAST. 03/02/19  Yes Marrian Salvage, FNP  sertraline (ZOLOFT) 50 MG tablet TAKE 1 TABLET EACH DAY. Patient taking differently: Take 50 mg by mouth daily.  02/24/19  Yes Marrian Salvage, FNP  traMADol (ULTRAM) 50 MG tablet Take 1 tablet (50 mg total) by mouth every 6 (six) hours as needed. Patient taking differently: Take 50 mg by mouth every 6 (six) hours as needed for moderate pain.  01/19/19  Yes Marrian Salvage, FNP  traZODone (DESYREL) 50 MG tablet TAKE 1/2 TO 1 TABLET AT BEDTIME AS NEEDED FOR REST. Patient taking differently: Take 25-50 mg by mouth at bedtime as needed for sleep.  02/21/19  Yes Marrian Salvage, FNP  zolpidem (AMBIEN) 5 MG tablet TAKE 1 TABLET AT BEDTIME AS NEEDED. Patient taking differently: Take 5 mg by mouth at bedtime as needed for sleep.  02/24/19  Yes Marrian Salvage, FNP  benzonatate (TESSALON) 100 MG capsule TAKE 1 CAPSULE TWICE DAILY AS NEEDED FOR  COUGH. Patient not taking: Reported on 04/22/2019 04/21/18   [name], MD  HYDROcodone-acetaminophen (NORCO/VICODIN) 5-325 MG tablet TAKE 1 TABLET EVERY 6-8 HOURS AS NEEDED FOR SEVERE PAIN NOT CONTROLLED BY TRAMADOL. 02/21/19   Marrian Salvage, FNP  Multiple Vitamin (MULTIVITAMIN WITH MINERALS) TABS tablet Take 1 tablet by mouth daily. 03/14/18   Roxan Hockey, MD  Polyethyl Glycol-Propyl Glycol (SYSTANE OP) Place 1 drop into both eyes 2 (two) times daily as needed (for dry eyes).     [provider]  promethazine (PHENERGAN) 25 MG tablet Take 1 tablet (25 mg total) by mouth every 8 (eight) hours as needed for nausea or vomiting. Patient not taking: Reported on 04/22/2019 01/19/19   Marrian Salvage, FNP  senna-docusate (SENOKOT-S) 8.6-50 MG tablet Take 2 tablets by mouth at bedtime. Patient not taking: Reported on 04/22/2019 03/13/18   Roxan Hockey, MD    Family History Family History  Problem Relation Age of Onset   Cancer Mother  Unknown type   Cancer Sister        Breast   Clotting disorder Neg Hx     Social History Social History   Tobacco Use   Smoking status: Former Smoker    Packs/day: 0.50    Types: Cigarettes    Start date: 07/16/1960    Last attempt to quit: 07/16/1998    Years since quitting: 20.7   Smokeless tobacco: Never Used  Substance Use Topics   Alcohol use: No    Alcohol/week: 0.0 standard drinks   Drug use: No     Allergies   Tylenol [acetaminophen] and Symbicort [budesonide-formoterol fumarate]   Review of Systems Review of Systems  Unable to perform ROS: Age  Constitutional: Negative for fever.  Gastrointestinal: Negative for nausea.  Musculoskeletal: Positive for back pain and myalgias.  Neurological: Positive for headaches.     Physical Exam Updated Vital Signs BP (!) 165/67    Pulse 66    Temp 98.3 F (36.8 C) (Oral)    Resp 13    SpO2 96%   Physical Exam Vitals signs and nursing note  reviewed.  Constitutional:      General: She is not in acute distress.    Appearance: She is well-developed.  HENT:     Head: Normocephalic and atraumatic.     Nose: Nose normal.     Mouth/Throat:     Mouth: Mucous membranes are dry.  Eyes:     General: No scleral icterus.       Right eye: No discharge.        Left eye: No discharge.     Conjunctiva/sclera: Conjunctivae normal.     Pupils: Pupils are equal, round, and reactive to light.  Neck:     Musculoskeletal: Normal range of motion and neck supple. Spinous process tenderness and muscular tenderness present.      Comments: C-collar in place. Cardiovascular:     Rate and Rhythm: Normal rate and regular rhythm.     Heart sounds: Normal heart sounds. No murmur. No friction rub. No gallop.   Pulmonary:     Effort: Pulmonary effort is normal. No respiratory distress.     Breath sounds: Normal breath sounds.  Abdominal:     General: Bowel sounds are normal. There is no distension.     Palpations: Abdomen is soft.     Tenderness: There is no abdominal tenderness. There is no guarding.  Musculoskeletal: Normal range of motion.       Back:     Comments: TTP of the lumbar spine at midline and paraspinal musculature as noted in image. Erythema in area. No step-off palpated. No visible bruising, edema or temperature change noted. No objective signs of numbness present. No saddle anesthesia. 2+ DP pulses bilaterally. Sensation intact to light touch.  Skin:    General: Skin is warm and dry.     Findings: No rash.  Neurological:     Mental Status: She is alert.     Motor: No abnormal muscle tone.     Coordination: Coordination normal.     Comments: Alert, oriented to self, month, day of week, place. Believes the year is 55 and the president is Marijean Bravo? No facial asymmetry noted. PERRL. Strength 4/5 in BUE. Symmetric tongue protrusion.      ED Treatments / Results  Labs (all labs ordered are listed, but only abnormal results are  displayed) Labs Reviewed  CK - Abnormal; Notable for the following components:  Result Value   Total CK 800 (*)    All other components within normal limits  COMPREHENSIVE METABOLIC PANEL - Abnormal; Notable for the following components:   Potassium 3.4 (*)    Glucose, Bld 137 (*)    AST 48 (*)    GFR calc non Af Amer 55 (*)    All other components within normal limits  CBC WITH DIFFERENTIAL/PLATELET - Abnormal; Notable for the following components:   WBC 16.5 (*)    Neutro Abs 14.3 (*)    Monocytes Absolute 1.4 (*)    Abs Immature Granulocytes 0.08 (*)    All other components within normal limits  URINALYSIS, ROUTINE W REFLEX MICROSCOPIC - Abnormal; Notable for the following components:   Color, Urine STRAW (*)    Hgb urine dipstick TRACE (*)    All other components within normal limits  URINALYSIS, MICROSCOPIC (REFLEX) - Abnormal; Notable for the following components:   Bacteria, UA RARE (*)    All other components within normal limits  URINE CULTURE  SARS CORONAVIRUS 2 (HOSPITAL ORDER, PERFORMED IN [facility])  TROPONIN I    EKG EKG Interpretation  Date/Time:  Friday Apr 22 2019 09:32:31 EDT Ventricular Rate:  88 PR Interval:    QRS Duration: 90 QT Interval:  492 QTC Calculation: 596 R Axis:   91 Text Interpretation:  Sinus rhythm Right axis deviation Borderline repolarization abnormality Prolonged QT interval Confirmed by Lennice Sites [phone]) on 04/22/2019 9:33:38 AM   Radiology Ct Head Wo Contrast  Result Date: 04/22/2019 CLINICAL DATA:  Pt arrives from home via EMS. EMS reports that pt was getting up from the toilet and looked like her hand might have slipped and she was halfway stuck between toilet and bathtub. Pain is reported. EXAM: CT HEAD WITHOUT CONTRAST CT CERVICAL SPINE WITHOUT CONTRAST TECHNIQUE: Multidetector CT imaging of the head and cervical spine was performed following the standard protocol without intravenous contrast. Multiplanar  CT image reconstructions of the cervical spine were also generated. COMPARISON:  CT head 08/13/2014. FINDINGS: CT HEAD FINDINGS Brain: No evidence for acute infarction, hemorrhage, mass lesion, hydrocephalus, or extra-axial fluid. Generalized atrophy. Moderate chronic microvascular ischemic change. Vascular: Calcification of the cavernous internal carotid arteries consistent with cerebrovascular atherosclerotic disease. No signs of intracranial large vessel occlusion. Skull: Normal. Negative for fracture or focal lesion. Sinuses/Orbits: No acute finding. Other: None. CT CERVICAL SPINE FINDINGS Alignment: Slight straightening of the normal cervical lordosis. No traumatic subluxation. 2 mm of degenerative anterolisthesis C7-T1 is facet mediated. Skull base and vertebrae: No acute fracture. No primary bone lesion or focal pathologic process. Soft tissues and spinal canal: No prevertebral fluid or swelling. No visible canal hematoma. Disc levels: C2-3:  Normal. C3-4:  Disc space narrowing. No impingement. C4-5:  Unremarkable. C5-6: Disc space narrowing. Osseous spurring. BILATERAL C6 foraminal narrowing. C6-7: Near complete loss of interspace height. Osseous spurring. BILATERAL C7 foraminal narrowing. C7-T1: 2 mm anterolisthesis. Facet arthropathy. No definite impingement. Upper chest: No mass or pneumothorax. Emphysema. Other: None. IMPRESSION: 1. No skull fracture or intracranial hemorrhage. Atrophy and small vessel disease. 2. No cervical spine fracture or traumatic subluxation. Multilevel spondylosis. Electronically Signed   By: Staci Righter M.D.   On: 04/22/2019 10:57   Ct Cervical Spine Wo Contrast  Result Date: 04/22/2019 CLINICAL DATA:  Pt arrives from home via EMS. EMS reports that pt was getting up from the toilet and looked like her hand might have slipped and she was halfway stuck between toilet and  bathtub. Pain is reported. EXAM: CT HEAD WITHOUT CONTRAST CT CERVICAL SPINE WITHOUT CONTRAST TECHNIQUE:  Multidetector CT imaging of the head and cervical spine was performed following the standard protocol without intravenous contrast. Multiplanar CT image reconstructions of the cervical spine were also generated. COMPARISON:  CT head 08/13/2014. FINDINGS: CT HEAD FINDINGS Brain: No evidence for acute infarction, hemorrhage, mass lesion, hydrocephalus, or extra-axial fluid. Generalized atrophy. Moderate chronic microvascular ischemic change. Vascular: Calcification of the cavernous internal carotid arteries consistent with cerebrovascular atherosclerotic disease. No signs of intracranial large vessel occlusion. Skull: Normal. Negative for fracture or focal lesion. Sinuses/Orbits: No acute finding. Other: None. CT CERVICAL SPINE FINDINGS Alignment: Slight straightening of the normal cervical lordosis. No traumatic subluxation. 2 mm of degenerative anterolisthesis C7-T1 is facet mediated. Skull base and vertebrae: No acute fracture. No primary bone lesion or focal pathologic process. Soft tissues and spinal canal: No prevertebral fluid or swelling. No visible canal hematoma. Disc levels: C2-3:  Normal. C3-4:  Disc space narrowing. No impingement. C4-5:  Unremarkable. C5-6: Disc space narrowing. Osseous spurring. BILATERAL C6 foraminal narrowing. C6-7: Near complete loss of interspace height. Osseous spurring. BILATERAL C7 foraminal narrowing. C7-T1: 2 mm anterolisthesis. Facet arthropathy. No definite impingement. Upper chest: No mass or pneumothorax. Emphysema. Other: None. IMPRESSION: 1. No skull fracture or intracranial hemorrhage. Atrophy and small vessel disease. 2. No cervical spine fracture or traumatic subluxation. Multilevel spondylosis. Electronically Signed   By: Staci Righter M.D.   On: 04/22/2019 10:57   Ct Lumbar Spine Wo Contrast  Result Date: 04/22/2019 CLINICAL DATA:  Pt arrives from home via EMS. EMS reports that pt was getting up from the toilet and looked like her hand might have slipped and she  was halfway stuck between toilet and bathtub. Pain is reported. EXAM: CT LUMBAR SPINE WITHOUT CONTRAST TECHNIQUE: Multidetector CT imaging of the lumbar spine was performed without intravenous contrast administration. Multiplanar CT image reconstructions were also generated. COMPARISON:  CT of the abdomen and pelvis with sagittal reformatted images, 04/12/2018. FINDINGS: Segmentation: 5 lumbar type vertebrae. Alignment: Trace retrolisthesis L3-4, nontraumatic. Vertebrae: Chronic endplate deformity inferior L3, unchanged from 2019. Paraspinal and other soft tissues: Incompletely evaluated, but clearly worsened, RIGHT lower lobe process, either pneumonia or tumor progression. Disc levels: No visible traumatic disc herniation. Moderate multifactorial spinal stenosis at L4-5 related to posterior element hypertrophy, and central disc protrusion. Additionally, central and leftward protrusion is noted L5-S1 with asymmetric facet arthropathy. IMPRESSION: 1. Moderate multifactorial spinal stenosis at L4-5 related to posterior element hypertrophy, and central disc protrusion; additional central and leftward protrusion at L5-S1. Neither appear posttraumatic. 2. Chronic endplate deformity L3. No lumbar compression fracture or traumatic subluxation. 3. Incompletely evaluated, but clearly worsened RIGHT lower lobe process, either pneumonia or tumor progression. Electronically Signed   By: Staci Righter M.D.   On: 04/22/2019 10:45   Dg Pelvis Portable  Result Date: 04/22/2019 CLINICAL DATA:  Fall. EXAM: PORTABLE PELVIS 1-2 VIEWS COMPARISON:  None. FINDINGS: There is no evidence of pelvic fracture or diastasis. No pelvic bone lesions are seen. IMPRESSION: Negative. Electronically Signed   By: Marijo Conception M.D.   On: 04/22/2019 10:37   Dg Chest Portable 1 View  Result Date: 04/22/2019 CLINICAL DATA:  Fall. EXAM: PORTABLE CHEST 1 VIEW COMPARISON:  Radiographs of January 19, 2019. CT scan of February 18, 2019. FINDINGS: The  heart size and mediastinal contours are within normal limits. No pneumothorax or pleural effusion is noted. Left lung is clear. Stable nodular density is  noted in right lower lobe consistent with neoplasm and associated fibrosis or scarring. The visualized skeletal structures are unremarkable. IMPRESSION: Stable right lower lobe nodule with surrounding fibrosis or scarring. No traumatic abnormality seen in the chest. Electronically Signed   By: Marijo Conception M.D.   On: 04/22/2019 11:46    Procedures Procedures (including critical care time)  Medications Ordered in ED Medications  sodium chloride 0.9 % bolus 1,000 mL (1,000 mLs Intravenous New Bag/Given 04/22/19 1128)     Initial Impression / Assessment and Plan / ED Course  I have reviewed the triage vital signs and the nursing notes.  Pertinent labs & imaging results that were available during my care of the patient were reviewed by me and considered in my medical decision making (see chart for details).        84 year old female with past medical history of hypertension, hyperlipidemia, COPD presents to ED for fall that occurred at unknown time.  History is limited secondary to patient's mental status, unsure if this is her baseline earlier this morning and states that she got stuck between but then tells me that she did hit the floor.  She does appear dehydrated with dry mucous membranes.  Imaging including CT of the lumbar and cervical spine, CT of the head, chest x-ray, pelvic x-ray unremarkable.  CK elevated to 800, renal function at baseline.  Mild hypokalemia 3.4.  Urinalysis with some bacteria.  Urine culture sent.  Troponin is negative.  Due to her age, risk factors and unknown story, feel that it would be best to admit her for possible syncope work-up. Will consult hospitalist for admission.   Final Clinical Impressions(s) / ED Diagnoses   Final diagnoses:  Fall in home, initial encounter  Dehydration  Elevated CK    ED  Discharge Orders    None     Portions of this note were generated with Dragon dictation software. Dictation errors may occur despite best attempts at proofreading.   Delia Heady, PA-C 04/22/19 1332    Lennice Sites, DO 04/22/19 1401

## 2019-04-22 NOTE — ED Notes (Signed)
Admitting at bedside 

## 2019-04-22 NOTE — Plan of Care (Signed)
Pt admited after ?syncope, was caught between toilet and wall for a short time, used her life alert bracelet.   Problem: Education: Goal: Knowledge of General Education information will improve Description Including pain rating scale, medication(s)/side effects and non-pharmacologic comfort measures Outcome: Progressing   Problem: Health Behavior/Discharge Planning: Goal: Ability to manage health-related needs will improve Outcome: Progressing   Problem: Clinical Measurements: Goal: Will remain free from infection Outcome: Progressing   Problem: Activity: Goal: Risk for activity intolerance will decrease Outcome: Progressing   Problem: Nutrition: Goal: Adequate nutrition will be maintained Outcome: Progressing   Problem: Elimination: Goal: Will not experience complications related to bowel motility Outcome: Progressing Goal: Will not experience complications related to urinary retention Outcome: Progressing   Problem: Pain Managment: Goal: General experience of comfort will improve Outcome: Progressing   Problem: Safety: Goal: Ability to remain free from injury will improve Outcome: Progressing   Problem: Skin Integrity: Goal: Risk for impaired skin integrity will decrease Outcome: Progressing

## 2019-04-22 NOTE — ED Notes (Signed)
ED TO INPATIENT HANDOFF REPORT  ED Nurse Name and Phone #: [phone]  S Name/Age/Gender Heather [name] 84 y.o. female Room/Bed: 022C/022C  Code Status   Code Status: Prior  Home/SNF/Other Home Patient oriented to: self Is this baseline? No   Triage Complete: Triage complete  Chief Complaint fall  Triage Note Pt arrives from home via EMS. EMS reports that pt was getting up from the toilet and looked like her hand might have slipped and she was halfway stuck between toilet and bathtub. Pt never fell all the way to ground just was stuck leaning over between bathtub and toilet.  EMS reports A&O x4 Bruising mid lower back  Pt lives alone at home and pressed her medical alert bracelet to call EMS.   Allergies Allergies  Allergen Reactions  . Tylenol [Acetaminophen] Other (See Comments)    Nightmares  . Symbicort [Budesonide-Formoterol Fumarate] Other (See Comments)    Pt felt like her tongue was swollen, could not swallow    Level of Care/Admitting Diagnosis ED Disposition    ED Disposition Condition [place]: [place] [[id number]]  Level of Care: Telemetry Cardiac [103]  I expect the patient will be discharged within 24 hours: No (not a candidate for 5C-Observation unit)  Covid Evaluation: Screening Protocol (No Symptoms)  Diagnosis: Fall [290176]  Admitting Physician: Norval Morton [[id number]]  Attending Physician: Norval Morton [[id number]]  PT Class (Do Not Modify): Observation [104]  PT Acc Code (Do Not Modify): Observation [10022]       B Medical/Surgery History Past Medical History:  Diagnosis Date  . ANXIETY 07/23/2009  . Atherosclerosis of aorta ([place]) 07/24/2015  . Complication of anesthesia 7 or 8 yrs ago   woke up during colonscopy  . COPD (chronic obstructive pulmonary disease) ([place])   . Coronary atherosclerosis 07/24/2015  . DEPRESSION 07/23/2009  . DIVERTICULOSIS, COLON 07/23/2009  . GERD 07/23/2009  .  Headache(784.0) 07/23/2009   occasional  . Hemorrhoids   . HIP PAIN, LEFT 05/16/2010  . History of Crohn's disease   . HYPERLIPIDEMIA 07/23/2009  . HYPERTENSION 07/23/2009  . HYPOTHYROIDISM 07/23/2009  . IBS (irritable bowel syndrome)   . Insomnia   . LOW BACK PAIN, CHRONIC 10/01/2009  . PARESTHESIA 10/01/2009  . Shingles 2006   back   Past Surgical History:  Procedure Laterality Date  . ABDOMINAL HYSTERECTOMY  age 7 or 11  . APPENDECTOMY  yrs ago  . BILATERAL SALPINGOOPHORECTOMY  age 3 or 67  . COLONOSCOPY N/A 11/17/2013   Procedure: COLONOSCOPY;  Surgeon: Cleotis Nipper, MD;  Location: [facility] END[facility]COPY;  Service: Endoscopy;  Laterality: N/A;  . ESOPHAGOGASTRODUODENOSCOPY N/A 11/17/2013   Procedure: ESOPHAGOGASTRODUODENOSCOPY (EGD);  Surgeon: Cleotis Nipper, MD;  Location: [facility];  Service: Endoscopy;  Laterality: N/A;  . HEMORRHOID SURGERY  yrs ago  . TONSILLECTOMY  yrs ago     A IV Location/Drains/Wounds Patient Lines/Drains/Airways Status   Active Line/Drains/Airways    Name:   Placement date:   Placement time:   Site:   Days:   Peripheral IV 04/22/19 Right Antecubital   04/22/19    1000    Antecubital   less than 1          Intake/Output Last 24 hours No intake or output data in the 24 hours ending 04/22/19 1448  Labs/Imaging Results for orders placed or performed during the hospital encounter of 04/22/19 (from the past 48 hour(s))  CK  Status: Abnormal   Collection Time: 04/22/19  9:59 AM  Result Value Ref Range   Total CK 800 (H) 38 - 234 U/L    Comment: Performed at [facility], [place] [address], [place], [place] [address]  Comprehensive metabolic panel     Status: Abnormal   Collection Time: 04/22/19  9:59 AM  Result Value Ref Range   Sodium 141 135 - 145 mmol/L   Potassium 3.4 (L) 3.5 - 5.1 mmol/L   Chloride 100 98 - 111 mmol/L   CO2 29 22 - 32 mmol/L   Glucose, Bld 137 (H) 70 - 99 mg/dL   BUN 11 8 - 23 mg/dL   Creatinine, Ser 0.97 0.44 -  1.00 mg/dL   Calcium 10.1 8.9 - 10.3 mg/dL   Total Protein 7.8 6.5 - 8.1 g/dL   Albumin 4.8 3.5 - 5.0 g/dL   AST 48 (H) 15 - 41 U/L   ALT 21 0 - 44 U/L   Alkaline Phosphatase 102 38 - 126 U/L   Total Bilirubin 0.9 0.3 - 1.2 mg/dL   GFR calc non Af Amer 55 (L) >60 mL/min   GFR calc Af Amer >60 >60 mL/min   Anion gap 12 5 - 15    Comment: Performed at [facility], [place] [address] [place], [place] [address]  CBC with Differential     Status: Abnormal   Collection Time: 04/22/19  9:59 AM  Result Value Ref Range   WBC 16.5 (H) 4.0 - 10.5 K/uL   RBC 4.61 3.87 - 5.11 MIL/uL   Hemoglobin 12.2 12.0 - 15.0 g/dL   HCT 39.6 36.0 - 46.0 %   MCV 85.9 80.0 - 100.0 fL   MCH 26.5 26.0 - 34.0 pg   MCHC 30.8 30.0 - 36.0 g/dL   RDW 14.3 11.5 - 15.5 %   Platelets 357 150 - 400 K/uL   nRBC 0.0 0.0 - 0.2 %   Neutrophils Relative % 86 %   Neutro Abs 14.3 (H) 1.7 - 7.7 K/uL   Lymphocytes Relative 4 %   Lymphs Abs 0.7 0.7 - 4.0 K/uL   Monocytes Relative 9 %   Monocytes Absolute 1.4 (H) 0.1 - 1.0 K/uL   Eosinophils Relative 0 %   Eosinophils Absolute 0.0 0.0 - 0.5 K/uL   Basophils Relative 0 %   Basophils Absolute 0.0 0.0 - 0.1 K/uL   Immature Granulocytes 1 %   Abs Immature Granulocytes 0.08 (H) 0.00 - 0.07 K/uL    Comment: Performed at [facility] [address] [place], [place] [address]  Troponin I - Once     Status: None   Collection Time: 04/22/19  9:59 AM  Result Value Ref Range   Troponin I <0.03 <0.03 ng/mL    Comment: Performed at [facility] [address] [place], [place] [address]  Urinalysis, Routine w reflex microscopic     Status: Abnormal   Collection Time: 04/22/19 11:38 AM  Result Value Ref Range   Color, Urine STRAW (A) YELLOW   APPearance CLEAR CLEAR   Specific Gravity, Urine 1.010 1.005 - 1.030   pH 6.0 5.0 - 8.0   Glucose, UA NEGATIVE NEGATIVE mg/dL   Hgb urine dipstick TRACE (A) NEGATIVE   Bilirubin Urine NEGATIVE NEGATIVE   Ketones, ur  NEGATIVE NEGATIVE mg/dL   Protein, ur NEGATIVE NEGATIVE mg/dL   Nitrite NEGATIVE NEGATIVE   Leukocytes,Ua NEGATIVE NEGATIVE    Comment: Performed at [facility]  [facility], [place] [address] [place], [place] [address]  Urinalysis, Microscopic (reflex)     Status: Abnormal   Collection Time: 04/22/19 11:38 AM  Result Value Ref Range   RBC / HPF 0-5 0 - 5 RBC/hpf   WBC, UA 0-5 0 - 5 WBC/hpf   Bacteria, UA RARE (A) NONE SEEN   Squamous Epithelial / LPF 0-5 0 - 5   Mucus PRESENT     Comment: Performed at [facility], [place] [address] [place], [place] [address]  SARS Coronavirus 2 (CEPHEID - Performed in [facility] hospital lab), Hosp Order     Status: None   Collection Time: 04/22/19  1:20 PM  Result Value Ref Range   SARS Coronavirus 2 NEGATIVE NEGATIVE    Comment: (NOTE) If result is NEGATIVE SARS-CoV-2 target nucleic acids are NOT DETECTED. The SARS-CoV-2 RNA is generally detectable in upper and lower  respiratory specimens during the acute phase of infection. The lowest  concentration of SARS-CoV-2 viral copies this assay can detect is 250  copies / mL. A negative result does not preclude SARS-CoV-2 infection  and should not be used as the sole basis for treatment or other  patient management decisions.  A negative result may occur with  improper specimen collection / handling, submission of specimen other  than nasopharyngeal swab, presence of viral mutation(s) within the  areas targeted by this assay, and inadequate number of viral copies  (<250 copies / mL). A negative result must be combined with clinical  observations, patient history, and epidemiological information. If result is POSITIVE SARS-CoV-2 target nucleic acids are DETECTED. The SARS-CoV-2 RNA is generally detectable in upper and lower  respiratory specimens dur ing the acute phase of infection.  Positive  results are indicative of active infection with SARS-CoV-2.  Clinical  correlation with patient history and  other diagnostic information is  necessary to determine patient infection status.  Positive results do  not rule out bacterial infection or co-infection with other viruses. If result is PRESUMPTIVE POSTIVE SARS-CoV-2 nucleic acids MAY BE PRESENT.   A presumptive positive result was obtained on the submitted specimen  and confirmed on repeat testing.  While 2019 novel coronavirus  (SARS-CoV-2) nucleic acids may be present in the submitted sample  additional confirmatory testing may be necessary for epidemiological  and / or clinical management purposes  to differentiate between  SARS-CoV-2 and other Sarbecovirus currently known to infect humans.  If clinically indicated additional testing with an alternate test  methodology [phone]) is advised. The SARS-CoV-2 RNA is generally  detectable in upper and lower respiratory sp ecimens during the acute  phase of infection. The expected result is Negative. Fact Sheet for Patients:  StrictlyIdeas.no Fact Sheet for Healthcare Providers: BankingDealers.co.za This test is not yet approved or cleared by the [place] FDA and has been authorized for detection and/or diagnosis of SARS-CoV-2 by FDA under an Emergency Use Authorization (EUA).  This EUA will remain in effect (meaning this test can be used) for the duration of the COVID-19 declaration under Section 564(b)(1) of the Act, 21 U.S.C. section 360bbb-3(b)(1), unless the authorization is terminated or revoked sooner. Performed at [facility], [place] [address], [place], [place] [address]    Ct Head Wo Contrast  Result Date: 04/22/2019 CLINICAL DATA:  Pt arrives from home via EMS. EMS reports that pt was getting up from the toilet and looked like her hand might have slipped and she was halfway stuck between toilet  and bathtub. Pain is reported. EXAM: CT HEAD WITHOUT CONTRAST CT CERVICAL SPINE WITHOUT CONTRAST TECHNIQUE: Multidetector CT  imaging of the head and cervical spine was performed following the standard protocol without intravenous contrast. Multiplanar CT image reconstructions of the cervical spine were also generated. COMPARISON:  CT head 08/13/2014. FINDINGS: CT HEAD FINDINGS Brain: No evidence for acute infarction, hemorrhage, mass lesion, hydrocephalus, or extra-axial fluid. Generalized atrophy. Moderate chronic microvascular ischemic change. Vascular: Calcification of the cavernous internal carotid arteries consistent with cerebrovascular atherosclerotic disease. No signs of intracranial large vessel occlusion. Skull: Normal. Negative for fracture or focal lesion. Sinuses/Orbits: No acute finding. Other: None. CT CERVICAL SPINE FINDINGS Alignment: Slight straightening of the normal cervical lordosis. No traumatic subluxation. 2 mm of degenerative anterolisthesis C7-T1 is facet mediated. Skull base and vertebrae: No acute fracture. No primary bone lesion or focal pathologic process. Soft tissues and spinal canal: No prevertebral fluid or swelling. No visible canal hematoma. Disc levels: C2-3:  Normal. C3-4:  Disc space narrowing. No impingement. C4-5:  Unremarkable. C5-6: Disc space narrowing. Osseous spurring. BILATERAL C6 foraminal narrowing. C6-7: Near complete loss of interspace height. Osseous spurring. BILATERAL C7 foraminal narrowing. C7-T1: 2 mm anterolisthesis. Facet arthropathy. No definite impingement. Upper chest: No mass or pneumothorax. Emphysema. Other: None. IMPRESSION: 1. No skull fracture or intracranial hemorrhage. Atrophy and small vessel disease. 2. No cervical spine fracture or traumatic subluxation. Multilevel spondylosis. Electronically Signed   By: Staci Righter M.D.   On: 04/22/2019 10:57   Ct Cervical Spine Wo Contrast  Result Date: 04/22/2019 CLINICAL DATA:  Pt arrives from home via EMS. EMS reports that pt was getting up from the toilet and looked like her hand might have slipped and she was halfway  stuck between toilet and bathtub. Pain is reported. EXAM: CT HEAD WITHOUT CONTRAST CT CERVICAL SPINE WITHOUT CONTRAST TECHNIQUE: Multidetector CT imaging of the head and cervical spine was performed following the standard protocol without intravenous contrast. Multiplanar CT image reconstructions of the cervical spine were also generated. COMPARISON:  CT head 08/13/2014. FINDINGS: CT HEAD FINDINGS Brain: No evidence for acute infarction, hemorrhage, mass lesion, hydrocephalus, or extra-axial fluid. Generalized atrophy. Moderate chronic microvascular ischemic change. Vascular: Calcification of the cavernous internal carotid arteries consistent with cerebrovascular atherosclerotic disease. No signs of intracranial large vessel occlusion. Skull: Normal. Negative for fracture or focal lesion. Sinuses/Orbits: No acute finding. Other: None. CT CERVICAL SPINE FINDINGS Alignment: Slight straightening of the normal cervical lordosis. No traumatic subluxation. 2 mm of degenerative anterolisthesis C7-T1 is facet mediated. Skull base and vertebrae: No acute fracture. No primary bone lesion or focal pathologic process. Soft tissues and spinal canal: No prevertebral fluid or swelling. No visible canal hematoma. Disc levels: C2-3:  Normal. C3-4:  Disc space narrowing. No impingement. C4-5:  Unremarkable. C5-6: Disc space narrowing. Osseous spurring. BILATERAL C6 foraminal narrowing. C6-7: Near complete loss of interspace height. Osseous spurring. BILATERAL C7 foraminal narrowing. C7-T1: 2 mm anterolisthesis. Facet arthropathy. No definite impingement. Upper chest: No mass or pneumothorax. Emphysema. Other: None. IMPRESSION: 1. No skull fracture or intracranial hemorrhage. Atrophy and small vessel disease. 2. No cervical spine fracture or traumatic subluxation. Multilevel spondylosis. Electronically Signed   By: Staci Righter M.D.   On: 04/22/2019 10:57   Ct Lumbar Spine Wo Contrast  Result Date: 04/22/2019 CLINICAL DATA:  Pt  arrives from home via EMS. EMS reports that pt was getting up from the toilet and looked like her hand might have slipped and she was halfway stuck between  toilet and bathtub. Pain is reported. EXAM: CT LUMBAR SPINE WITHOUT CONTRAST TECHNIQUE: Multidetector CT imaging of the lumbar spine was performed without intravenous contrast administration. Multiplanar CT image reconstructions were also generated. COMPARISON:  CT of the abdomen and pelvis with sagittal reformatted images, 04/12/2018. FINDINGS: Segmentation: 5 lumbar type vertebrae. Alignment: Trace retrolisthesis L3-4, nontraumatic. Vertebrae: Chronic endplate deformity inferior L3, unchanged from 2019. Paraspinal and other soft tissues: Incompletely evaluated, but clearly worsened, RIGHT lower lobe process, either pneumonia or tumor progression. Disc levels: No visible traumatic disc herniation. Moderate multifactorial spinal stenosis at L4-5 related to posterior element hypertrophy, and central disc protrusion. Additionally, central and leftward protrusion is noted L5-S1 with asymmetric facet arthropathy. IMPRESSION: 1. Moderate multifactorial spinal stenosis at L4-5 related to posterior element hypertrophy, and central disc protrusion; additional central and leftward protrusion at L5-S1. Neither appear posttraumatic. 2. Chronic endplate deformity L3. No lumbar compression fracture or traumatic subluxation. 3. Incompletely evaluated, but clearly worsened RIGHT lower lobe process, either pneumonia or tumor progression. Electronically Signed   By: Staci Righter M.D.   On: 04/22/2019 10:45   Dg Pelvis Portable  Result Date: 04/22/2019 CLINICAL DATA:  Fall. EXAM: PORTABLE PELVIS 1-2 VIEWS COMPARISON:  None. FINDINGS: There is no evidence of pelvic fracture or diastasis. No pelvic bone lesions are seen. IMPRESSION: Negative. Electronically Signed   By: Marijo Conception M.D.   On: 04/22/2019 10:37   Dg Chest Portable 1 View  Result Date: 04/22/2019 CLINICAL  DATA:  Fall. EXAM: PORTABLE CHEST 1 VIEW COMPARISON:  Radiographs of January 19, 2019. CT scan of February 18, 2019. FINDINGS: The heart size and mediastinal contours are within normal limits. No pneumothorax or pleural effusion is noted. Left lung is clear. Stable nodular density is noted in right lower lobe consistent with neoplasm and associated fibrosis or scarring. The visualized skeletal structures are unremarkable. IMPRESSION: Stable right lower lobe nodule with surrounding fibrosis or scarring. No traumatic abnormality seen in the chest. Electronically Signed   By: Marijo Conception M.D.   On: 04/22/2019 11:46    Pending Labs Unresulted Labs (From admission, onward)    Start     Ordered   04/22/19 0948  Urine culture  ONCE - STAT,   STAT     04/22/19 0948          Vitals/Pain Today's Vitals   04/22/19 1200 04/22/19 1230 04/22/19 1300 04/22/19 1330  BP: (!) 154/69 (!) 178/69 (!) 165/67 (!) 160/75  Pulse: 72 75 66   Resp: 13 10 13    Temp:      TempSrc:      SpO2: 98% 96% 96%   PainSc:        Isolation Precautions No active isolations  Medications Medications  potassium chloride SA (K-DUR) CR tablet 20 mEq (has no administration in time range)  sodium chloride 0.9 % bolus 1,000 mL (1,000 mLs Intravenous New Bag/Given 04/22/19 1128)    Mobility walks with device High fall risk   Focused Assessments Neuro Assessment Handoff:  Swallow screen pass? No          Neuro Assessment: Exceptions to WDL Neuro Checks:      Last Documented NIHSS Modified Score:   Has TPA been given? No If patient is a Neuro Trauma and patient is going to OR before floor call report to [place] nurse: [phone] or [phone]     R Recommendations: See Admitting Provider Note  Report given to:   Additional Notes: Bedpan

## 2019-04-22 NOTE — ED Triage Notes (Addendum)
Pt arrives from home via EMS. EMS reports that pt was getting up from the toilet and looked like her hand might have slipped and she was halfway stuck between toilet and bathtub. Pt never fell all the way to ground just was stuck leaning over between bathtub and toilet.  EMS reports A&O x4 Bruising mid lower back  Pt lives alone at home and pressed her medical alert bracelet to call EMS.

## 2019-04-23 DIAGNOSIS — M6282 Rhabdomyolysis: Secondary | ICD-10-CM | POA: Diagnosis not present

## 2019-04-23 DIAGNOSIS — E039 Hypothyroidism, unspecified: Secondary | ICD-10-CM | POA: Diagnosis not present

## 2019-04-23 DIAGNOSIS — M549 Dorsalgia, unspecified: Secondary | ICD-10-CM | POA: Diagnosis not present

## 2019-04-23 DIAGNOSIS — Z90710 Acquired absence of both cervix and uterus: Secondary | ICD-10-CM | POA: Diagnosis not present

## 2019-04-23 DIAGNOSIS — W19XXXA Unspecified fall, initial encounter: Secondary | ICD-10-CM | POA: Diagnosis not present

## 2019-04-23 DIAGNOSIS — K5730 Diverticulosis of large intestine without perforation or abscess without bleeding: Secondary | ICD-10-CM | POA: Diagnosis present

## 2019-04-23 DIAGNOSIS — F419 Anxiety disorder, unspecified: Secondary | ICD-10-CM | POA: Diagnosis not present

## 2019-04-23 DIAGNOSIS — F329 Major depressive disorder, single episode, unspecified: Secondary | ICD-10-CM | POA: Diagnosis present

## 2019-04-23 DIAGNOSIS — G8929 Other chronic pain: Secondary | ICD-10-CM | POA: Diagnosis not present

## 2019-04-23 DIAGNOSIS — E785 Hyperlipidemia, unspecified: Secondary | ICD-10-CM | POA: Diagnosis present

## 2019-04-23 DIAGNOSIS — K589 Irritable bowel syndrome without diarrhea: Secondary | ICD-10-CM | POA: Diagnosis not present

## 2019-04-23 DIAGNOSIS — Z85118 Personal history of other malignant neoplasm of bronchus and lung: Secondary | ICD-10-CM | POA: Diagnosis not present

## 2019-04-23 DIAGNOSIS — R296 Repeated falls: Secondary | ICD-10-CM | POA: Diagnosis present

## 2019-04-23 DIAGNOSIS — W1830XA Fall on same level, unspecified, initial encounter: Secondary | ICD-10-CM | POA: Diagnosis present

## 2019-04-23 DIAGNOSIS — R54 Age-related physical debility: Secondary | ICD-10-CM | POA: Diagnosis present

## 2019-04-23 DIAGNOSIS — M48061 Spinal stenosis, lumbar region without neurogenic claudication: Secondary | ICD-10-CM | POA: Diagnosis not present

## 2019-04-23 DIAGNOSIS — I2510 Atherosclerotic heart disease of native coronary artery without angina pectoris: Secondary | ICD-10-CM | POA: Diagnosis not present

## 2019-04-23 DIAGNOSIS — I10 Essential (primary) hypertension: Secondary | ICD-10-CM | POA: Diagnosis not present

## 2019-04-23 DIAGNOSIS — K219 Gastro-esophageal reflux disease without esophagitis: Secondary | ICD-10-CM | POA: Diagnosis present

## 2019-04-23 DIAGNOSIS — R748 Abnormal levels of other serum enzymes: Secondary | ICD-10-CM | POA: Diagnosis not present

## 2019-04-23 DIAGNOSIS — Z87891 Personal history of nicotine dependence: Secondary | ICD-10-CM | POA: Diagnosis not present

## 2019-04-23 DIAGNOSIS — I700 Atherosclerosis of aorta: Secondary | ICD-10-CM | POA: Diagnosis present

## 2019-04-23 DIAGNOSIS — E876 Hypokalemia: Secondary | ICD-10-CM | POA: Diagnosis not present

## 2019-04-23 DIAGNOSIS — Z1159 Encounter for screening for other viral diseases: Secondary | ICD-10-CM | POA: Diagnosis not present

## 2019-04-23 DIAGNOSIS — J439 Emphysema, unspecified: Secondary | ICD-10-CM | POA: Diagnosis not present

## 2019-04-23 DIAGNOSIS — M545 Low back pain: Secondary | ICD-10-CM | POA: Diagnosis not present

## 2019-04-23 DIAGNOSIS — E860 Dehydration: Secondary | ICD-10-CM | POA: Diagnosis present

## 2019-04-23 LAB — CBC
HCT: 33 % — ABNORMAL LOW (ref 36.0–46.0)
Hemoglobin: 10.4 g/dL — ABNORMAL LOW (ref 12.0–15.0)
MCH: 26.8 pg (ref 26.0–34.0)
MCHC: 31.5 g/dL (ref 30.0–36.0)
MCV: 85.1 fL (ref 80.0–100.0)
Platelets: 293 10*3/uL (ref 150–400)
RBC: 3.88 MIL/uL (ref 3.87–5.11)
RDW: 14.7 % (ref 11.5–15.5)
WBC: 8.9 10*3/uL (ref 4.0–10.5)
nRBC: 0 % (ref 0.0–0.2)

## 2019-04-23 LAB — BASIC METABOLIC PANEL
Anion gap: 11 (ref 5–15)
BUN: 9 mg/dL (ref 8–23)
CO2: 26 mmol/L (ref 22–32)
Calcium: 9 mg/dL (ref 8.9–10.3)
Chloride: 104 mmol/L (ref 98–111)
Creatinine, Ser: 0.82 mg/dL (ref 0.44–1.00)
GFR calc Af Amer: >60 mL/min (ref >60)
GFR calc non Af Amer: >60 mL/min (ref >60)
Glucose, Bld: 92 mg/dL (ref 70–99)
Potassium: 2.9 mmol/L — ABNORMAL LOW (ref 3.5–5.1)
Sodium: 141 mmol/L (ref 135–145)

## 2019-04-23 LAB — CK: Total CK: 1411 U/L — ABNORMAL HIGH (ref 38–234)

## 2019-04-23 LAB — URINE CULTURE: Culture: NO GROWTH

## 2019-04-23 MED ORDER — SODIUM CHLORIDE 0.9 % IV SOLN
INTRAVENOUS | Status: AC
  Administered 2019-04-23: 23:00:00 via INTRAVENOUS
  Administered 2019-04-23: 1000 mL via INTRAVENOUS
  Administered 2019-04-23: 500 mL via INTRAVENOUS
  Administered 2019-04-24: 1000 mL via INTRAVENOUS

## 2019-04-23 MED ORDER — POTASSIUM CHLORIDE CRYS ER 20 MEQ PO TBCR
40.0000 meq | EXTENDED_RELEASE_TABLET | Freq: Once | ORAL | Status: AC
  Administered 2019-04-23: 40 meq via ORAL
  Filled 2019-04-23: qty 2

## 2019-04-23 MED ORDER — POTASSIUM CHLORIDE 10 MEQ/100ML IV SOLN
10.0000 meq | INTRAVENOUS | Status: AC
  Administered 2019-04-23 (×2): 10 meq via INTRAVENOUS
  Filled 2019-04-23 (×2): qty 100

## 2019-04-23 NOTE — Progress Notes (Signed)
PROGRESS NOTE        PATIENT DETAILS Name: Heather [name] Age: 84 y.o. Sex: female Date of Birth: 1935/07/06 Admit Date: 04/22/2019 Admitting Physician Norval Morton, MD [id number], Marvis Repress, FNP  Brief Narrative: Patient is a 84 y.o. 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  She was found to have rhabdomyolysis, leukocytosis-and incredibly weak/deconditioned-and was subsequently admitted to the hospitalist service.  Subjective: Very poor historian-gives very vague and tangential answers to simple questions.  Claims she walks around the house with a walker/cane-apparently has chronic back pain.  Lives alone but family checks on her frequently.  Assessment/Plan: Frequent falls: Gives a history of frequent falls-on admission-she fell and got stuck between the toilet and commode.  CT head/C-spine/L-spine/x-ray pelvis-all negative for fractures.  Suspect cause of frequent falls is multifactorial-secondary to frailty/advanced age/lumbar spinal stenosis.  Await PT/OT evaluation but suspect she requires SNF.  Rhabdomyolysis: Worsening CK-start IV fluids (NS at 75 cc an hour)-repeat CK tomorrow.  Etiology secondary to prolonged immobilization/lying on the floor from her falling.  Hypokalemia: We will replete with IV KCl-recheck tomorrow.  Hypothyroidism: TSH within normal limits continue with levothyroxine  COPD: Stable-continue bronchodilators.  History of right lung neoplasm s/p radiation therapy  Chronic back pain/spinal stenosis: Continue with supportive care-she is very weak/frail and deconditioned-suspect this is best managed medically-she will likely not be a candidate for any sort of surgical intervention at this  point.  Debility/deconditioning: Suspect a significant amount of debility at baseline-apparently walks with the help of a walker-suspect she has worsened further from her baseline due to rhabdomyolysis/acute illness.  Await PT/OT evaluation.  COVID 19 screen:negative  DVT Prophylaxis: Prophylactic Lovenox   Code Status: DNR-confirmed with the patient's son over the phone, who indicated that patient has a prior existing DNR order.  Family Communication: Son over the phone  Disposition Plan: Remain inpatient-but will plan on Home health vs SNF on discharge   Antimicrobial agents: Anti-infectives (From admission, onward)   None      Procedures: Nonee  CONSULTS:  None  Time spent: 25- minutes-Greater than 50% of this time was spent in counseling, explanation of diagnosis, planning of further management, and coordination of care.  MEDICATIONS: Scheduled Meds:  diltiazem  240 mg Oral Daily   enoxaparin (LOVENOX) injection  40 mg Subcutaneous Q24H   fluticasone furoate-vilanterol  1 puff Inhalation Daily   levothyroxine  25 mcg Oral Q0600   Continuous Infusions:  sodium chloride 1,000 mL (04/23/19 0846)   potassium chloride 10 mEq (04/23/19 1008)   PRN Meds:.albuterol, clorazepate, HYDROcodone-acetaminophen, ondansetron **OR** ondansetron (ZOFRAN) IV, zolpidem   PHYSICAL EXAM: Vital signs: Vitals:   04/22/19 2203 04/22/19 2207 04/23/19 0532 04/23/19 0618  BP: (!) 142/66 (!) 133/52 (!) 148/73   Pulse: 72 71 64   Resp: 16 18 16    Temp: 98.6 F (37 C)  98.4 F (36.9 C)   TempSrc: Oral  Oral   SpO2: 92% 92% 90% 94%  Weight:    54 kg  Height:       Filed Weights   04/23/19 0618  Weight: 54 kg   Body mass index is 24.87 kg/m.   General  appearance :Awake, alert, not in any distress.  HEENT: Atraumatic and Normocephalic Neck: supple Resp:Good air entry bilaterally, no added sounds  CVS: S1 S2 regular GI: Bowel sounds present, Non tender and not  distended with no gaurding, rigidity or rebound.No organomegaly Neurology: Appears to have significant amount of generalized weakness-able to just about left her lower legs of the bed. Musculoskeletal:No digital cyanosis Skin:No Rash, warm and dry Wounds:N/A  I have personally reviewed following labs and imaging studies  LABORATORY DATA: CBC: Recent Labs  Lab 04/22/19 0959 04/23/19 0213  WBC 16.5* 8.9  NEUTROABS 14.3*  --   HGB 12.2 10.4*  HCT 39.6 33.0*  MCV 85.9 85.1  PLT 357 381    Basic Metabolic Panel: Recent Labs  Lab 04/22/19 0959 04/23/19 0213  NA 141 141  K 3.4* 2.9*  CL 100 104  CO2 29 26  GLUCOSE 137* 92  BUN 11 9  CREATININE 0.97 0.82  CALCIUM 10.1 9.0    GFR: Estimated Creatinine Clearance: 39.2 mL/min (by C-G formula based on SCr of 0.82 mg/dL).  Liver Function Tests: Recent Labs  Lab 04/22/19 0959  AST 48*  ALT 21  ALKPHOS 102  BILITOT 0.9  PROT 7.8  ALBUMIN 4.8   No results for input(s): LIPASE, AMYLASE in the last 168 hours. No results for input(s): AMMONIA in the last 168 hours.  Coagulation Profile: No results for input(s): INR, PROTIME in the last 168 hours.  Cardiac Enzymes: Recent Labs  Lab 04/22/19 0959 04/23/19 0213  CKTOTAL 800* 1,411*  TROPONINI <0.03  --     BNP (last 3 results) Recent Labs    05/31/18 1646  PROBNP 68.0    HbA1C: No results for input(s): HGBA1C in the last 72 hours.  CBG: No results for input(s): GLUCAP in the last 168 hours.  Lipid Profile: No results for input(s): CHOL, HDL, LDLCALC, TRIG, CHOLHDL, LDLDIRECT in the last 72 hours.  Thyroid Function Tests: Recent Labs    04/22/19 1932  TSH 0.761    Anemia Panel: No results for input(s): VITAMINB12, FOLATE, FERRITIN, TIBC, IRON, RETICCTPCT in the last 72 hours.  Urine analysis:    Component Value Date/Time   COLORURINE STRAW (A) 04/22/2019 1138   APPEARANCEUR CLEAR 04/22/2019 1138   LABSPEC 1.010 04/22/2019 1138   PHURINE 6.0  04/22/2019 1138   GLUCOSEU NEGATIVE 04/22/2019 1138   HGBUR TRACE (A) 04/22/2019 1138   HGBUR trace-lysed 11/15/2010 1046   BILIRUBINUR NEGATIVE 04/22/2019 1138   Dent 04/22/2019 1138   PROTEINUR NEGATIVE 04/22/2019 1138   UROBILINOGEN 0.2 08/22/2014 1105   NITRITE NEGATIVE 04/22/2019 1138   LEUKOCYTESUR NEGATIVE 04/22/2019 1138    Sepsis Labs: Lactic Acid, Venous    Component Value Date/Time   LATICACIDVEN 1.23 11/17/2015 1938    MICROBIOLOGY: Recent Results (from the past 240 hour(s))  SARS Coronavirus 2 (CEPHEID - Performed in [place] hospital lab), Hosp Order     Status: None   Collection Time: 04/22/19  1:20 PM  Result Value Ref Range Status   SARS Coronavirus 2 NEGATIVE NEGATIVE Final    Comment: (NOTE) If result is NEGATIVE SARS-CoV-2 target nucleic acids are NOT DETECTED. The SARS-CoV-2 RNA is generally detectable in upper and lower  respiratory specimens during the acute phase of infection. The lowest  concentration of SARS-CoV-2 viral copies this assay can detect is 250  copies / mL. A negative result does not preclude SARS-CoV-2 infection  and should not be used as the sole basis for treatment  or other  patient management decisions.  A negative result may occur with  improper specimen collection / handling, submission of specimen other  than nasopharyngeal swab, presence of viral mutation(s) within the  areas targeted by this assay, and inadequate number of viral copies  (<250 copies / mL). A negative result must be combined with clinical  observations, patient history, and epidemiological information. If result is POSITIVE SARS-CoV-2 target nucleic acids are DETECTED. The SARS-CoV-2 RNA is generally detectable in upper and lower  respiratory specimens dur ing the acute phase of infection.  Positive  results are indicative of active infection with SARS-CoV-2.  Clinical  correlation with patient history and other diagnostic information is   necessary to determine patient infection status.  Positive results do  not rule out bacterial infection or co-infection with other viruses. If result is PRESUMPTIVE POSTIVE SARS-CoV-2 nucleic acids MAY BE PRESENT.   A presumptive positive result was obtained on the submitted specimen  and confirmed on repeat testing.  While 2019 novel coronavirus  (SARS-CoV-2) nucleic acids may be present in the submitted sample  additional confirmatory testing may be necessary for epidemiological  and / or clinical management purposes  to differentiate between  SARS-CoV-2 and other Sarbecovirus currently known to infect humans.  If clinically indicated additional testing with an alternate test  methodology [phone]) is advised. The SARS-CoV-2 RNA is generally  detectable in upper and lower respiratory sp ecimens during the acute  phase of infection. The expected result is Negative. Fact Sheet for Patients:  StrictlyIdeas.no Fact Sheet for Healthcare Providers: BankingDealers.co.za This test is not yet approved or cleared by the [place] FDA and has been authorized for detection and/or diagnosis of SARS-CoV-2 by FDA under an Emergency Use Authorization (EUA).  This EUA will remain in effect (meaning this test can be used) for the duration of the COVID-19 declaration under Section 564(b)(1) of the Act, 21 U.S.C. section 360bbb-3(b)(1), unless the authorization is terminated or revoked sooner. Performed at [facility], [place] [address], [place], [place] [address]     RADIOLOGY STUDIES/RESULTS: Ct Head Wo Contrast  Result Date: 04/22/2019 CLINICAL DATA:  Pt arrives from home via EMS. EMS reports that pt was getting up from the toilet and looked like her hand might have slipped and she was halfway stuck between toilet and bathtub. Pain is reported. EXAM: CT HEAD WITHOUT CONTRAST CT CERVICAL SPINE WITHOUT CONTRAST TECHNIQUE: Multidetector CT  imaging of the head and cervical spine was performed following the standard protocol without intravenous contrast. Multiplanar CT image reconstructions of the cervical spine were also generated. COMPARISON:  CT head 08/13/2014. FINDINGS: CT HEAD FINDINGS Brain: No evidence for acute infarction, hemorrhage, mass lesion, hydrocephalus, or extra-axial fluid. Generalized atrophy. Moderate chronic microvascular ischemic change. Vascular: Calcification of the cavernous internal carotid arteries consistent with cerebrovascular atherosclerotic disease. No signs of intracranial large vessel occlusion. Skull: Normal. Negative for fracture or focal lesion. Sinuses/Orbits: No acute finding. Other: None. CT CERVICAL SPINE FINDINGS Alignment: Slight straightening of the normal cervical lordosis. No traumatic subluxation. 2 mm of degenerative anterolisthesis C7-T1 is facet mediated. Skull base and vertebrae: No acute fracture. No primary bone lesion or focal pathologic process. Soft tissues and spinal canal: No prevertebral fluid or swelling. No visible canal hematoma. Disc levels: C2-3:  Normal. C3-4:  Disc space narrowing. No impingement. C4-5:  Unremarkable. C5-6: Disc space narrowing. Osseous spurring. BILATERAL C6 foraminal narrowing. C6-7: Near complete loss of interspace height. Osseous spurring. BILATERAL C7 foraminal narrowing. C7-T1:  2 mm anterolisthesis. Facet arthropathy. No definite impingement. Upper chest: No mass or pneumothorax. Emphysema. Other: None. IMPRESSION: 1. No skull fracture or intracranial hemorrhage. Atrophy and small vessel disease. 2. No cervical spine fracture or traumatic subluxation. Multilevel spondylosis. Electronically Signed   By: Staci Righter M.D.   On: 04/22/2019 10:57   Ct Cervical Spine Wo Contrast  Result Date: 04/22/2019 CLINICAL DATA:  Pt arrives from home via EMS. EMS reports that pt was getting up from the toilet and looked like her hand might have slipped and she was halfway  stuck between toilet and bathtub. Pain is reported. EXAM: CT HEAD WITHOUT CONTRAST CT CERVICAL SPINE WITHOUT CONTRAST TECHNIQUE: Multidetector CT imaging of the head and cervical spine was performed following the standard protocol without intravenous contrast. Multiplanar CT image reconstructions of the cervical spine were also generated. COMPARISON:  CT head 08/13/2014. FINDINGS: CT HEAD FINDINGS Brain: No evidence for acute infarction, hemorrhage, mass lesion, hydrocephalus, or extra-axial fluid. Generalized atrophy. Moderate chronic microvascular ischemic change. Vascular: Calcification of the cavernous internal carotid arteries consistent with cerebrovascular atherosclerotic disease. No signs of intracranial large vessel occlusion. Skull: Normal. Negative for fracture or focal lesion. Sinuses/Orbits: No acute finding. Other: None. CT CERVICAL SPINE FINDINGS Alignment: Slight straightening of the normal cervical lordosis. No traumatic subluxation. 2 mm of degenerative anterolisthesis C7-T1 is facet mediated. Skull base and vertebrae: No acute fracture. No primary bone lesion or focal pathologic process. Soft tissues and spinal canal: No prevertebral fluid or swelling. No visible canal hematoma. Disc levels: C2-3:  Normal. C3-4:  Disc space narrowing. No impingement. C4-5:  Unremarkable. C5-6: Disc space narrowing. Osseous spurring. BILATERAL C6 foraminal narrowing. C6-7: Near complete loss of interspace height. Osseous spurring. BILATERAL C7 foraminal narrowing. C7-T1: 2 mm anterolisthesis. Facet arthropathy. No definite impingement. Upper chest: No mass or pneumothorax. Emphysema. Other: None. IMPRESSION: 1. No skull fracture or intracranial hemorrhage. Atrophy and small vessel disease. 2. No cervical spine fracture or traumatic subluxation. Multilevel spondylosis. Electronically Signed   By: Staci Righter M.D.   On: 04/22/2019 10:57   Ct Lumbar Spine Wo Contrast  Result Date: 04/22/2019 CLINICAL DATA:  Pt  arrives from home via EMS. EMS reports that pt was getting up from the toilet and looked like her hand might have slipped and she was halfway stuck between toilet and bathtub. Pain is reported. EXAM: CT LUMBAR SPINE WITHOUT CONTRAST TECHNIQUE: Multidetector CT imaging of the lumbar spine was performed without intravenous contrast administration. Multiplanar CT image reconstructions were also generated. COMPARISON:  CT of the abdomen and pelvis with sagittal reformatted images, 04/12/2018. FINDINGS: Segmentation: 5 lumbar type vertebrae. Alignment: Trace retrolisthesis L3-4, nontraumatic. Vertebrae: Chronic endplate deformity inferior L3, unchanged from 2019. Paraspinal and other soft tissues: Incompletely evaluated, but clearly worsened, RIGHT lower lobe process, either pneumonia or tumor progression. Disc levels: No visible traumatic disc herniation. Moderate multifactorial spinal stenosis at L4-5 related to posterior element hypertrophy, and central disc protrusion. Additionally, central and leftward protrusion is noted L5-S1 with asymmetric facet arthropathy. IMPRESSION: 1. Moderate multifactorial spinal stenosis at L4-5 related to posterior element hypertrophy, and central disc protrusion; additional central and leftward protrusion at L5-S1. Neither appear posttraumatic. 2. Chronic endplate deformity L3. No lumbar compression fracture or traumatic subluxation. 3. Incompletely evaluated, but clearly worsened RIGHT lower lobe process, either pneumonia or tumor progression. Electronically Signed   By: Staci Righter M.D.   On: 04/22/2019 10:45   Dg Pelvis Portable  Result Date: 04/22/2019 CLINICAL DATA:  Fall. EXAM:  PORTABLE PELVIS 1-2 VIEWS COMPARISON:  None. FINDINGS: There is no evidence of pelvic fracture or diastasis. No pelvic bone lesions are seen. IMPRESSION: Negative. Electronically Signed   By: Marijo Conception M.D.   On: 04/22/2019 10:37   Dg Chest Portable 1 View  Result Date: 04/22/2019 CLINICAL  DATA:  Fall. EXAM: PORTABLE CHEST 1 VIEW COMPARISON:  Radiographs of January 19, 2019. CT scan of February 18, 2019. FINDINGS: The heart size and mediastinal contours are within normal limits. No pneumothorax or pleural effusion is noted. Left lung is clear. Stable nodular density is noted in right lower lobe consistent with neoplasm and associated fibrosis or scarring. The visualized skeletal structures are unremarkable. IMPRESSION: Stable right lower lobe nodule with surrounding fibrosis or scarring. No traumatic abnormality seen in the chest. Electronically Signed   By: Marijo Conception M.D.   On: 04/22/2019 11:46     LOS: 0 days   Oren Binet, MD  [facility]  If 7PM-7AM, please contact night-coverage  Please page via [email or web address]  Go to [email or web address] and use [place]'s universal password to access. If you do not have the password, please contact the hospital operator.  Locate the [facility] provider you are looking for under [facility] and page to a number that you can be directly reached. If you still have difficulty reaching the provider, please page the [facility] (Director on Call) for the Hospitalists listed on amion for assistance.  04/23/2019, 10:33 AM

## 2019-04-23 NOTE — Evaluation (Signed)
Physical Therapy Evaluation Patient Details Name: Heather [name] MRN: [id number] DOB: Apr 28, 1935 Today's Date: 04/23/2019   History of Present Illness  Pt is a 84 yo F with primary problem of acute fall. PMH significant for hypothyroidism, anxiety, COPD, kypokalemia, elevated CK, leukocytosis, LBP. Also complains of Left groin pain and Right shoulder pain.   Clinical Impression  Pt found in bed and was willing to participate in PT because she is stiff in bed. Reports significant pain in her coccyx, Lt groin, Right flank pain on posterior aspect and Right shoulder pain. Assistance required for mobility and transfers. Ambulated to the toilet and did a couple of exercises at the sink which she reports made her feel much better. Pt was left in the chair and asked to do frequent movement with arms and legs to avoid getting stiff. Will benefit from further balance and strength training at CIR as she lives alone but has a son nearby who is able to help her.     Follow Up Recommendations CIR    Equipment Recommendations  Rolling walker with 5" wheels    Recommendations for Other Services Rehab consult     Precautions / Restrictions Precautions Precautions: Fall Restrictions Weight Bearing Restrictions: No      Mobility  Bed Mobility Overal bed mobility: Needs Assistance Bed Mobility: Supine to Sit     Supine to sit: Mod assist        Transfers Overall transfer level: Needs assistance Equipment used: Rolling walker (2 wheeled) Transfers: Sit to/from Stand Sit to Stand: Min guard         General transfer comment: able to stand with min guard with elevated bed  Ambulation/Gait Ambulation/Gait assistance: Min assist Gait Distance (Feet): 20 Feet Assistive device: Rolling walker (2 wheeled) Gait Pattern/deviations: Antalgic     General Gait Details: decreased cadence, flat foot bilaterally, flexed posture  Stairs            Wheelchair Mobility    Modified  Rankin (Stroke Patients Only)       Balance Overall balance assessment: Needs assistance;History of Falls Sitting-balance support: Single extremity supported;Feet supported Sitting balance-Leahy Scale: Good     Standing balance support: Bilateral upper extremity supported Standing balance-Leahy Scale: Poor Standing balance comment: requires bil UE support on RW                             Pertinent Vitals/Pain Pain Assessment: 0-10 Pain Score: 10-Worst pain ever Pain Location: Right shoulder, Rt posterior flank, coccyx, Lt groin Pain Descriptors / Indicators: Aching Pain Intervention(s): Limited activity within patient's tolerance;Monitored during session    Home Living Family/patient expects to be discharged to:: Private residence Living Arrangements: Alone Available Help at Discharge: Family;Available PRN/intermittently   Home Access: Stairs to enter     Home Layout: One level Home Equipment: Kasandra Knudsen - quad;Walker - 2 wheels;Grab bars - tub/shower;Hand held shower head;Shower seat;Walker - 4 wheels Additional Comments: alternates rollator and RW    Prior Function Level of Independence: Independent with assistive device(s)   Gait / Transfers Assistance Needed: reports alternating use of rollator and RW  ADL's / Homemaking Assistance Needed: reports doing her own housekeeping and is independent in ADLs        Hand Dominance        Extremity/Trunk Assessment   Upper Extremity Assessment Upper Extremity Assessment: Generalized weakness    Lower Extremity Assessment Lower Extremity Assessment: Generalized weakness  Cervical / Trunk Assessment Cervical / Trunk Assessment: Other exceptions Cervical / Trunk Exceptions: decreased lumbar lordosis, edema around Rt clavicle with significant bruising on posterior Rt GHJ  Communication   Communication: No difficulties  Cognition Arousal/Alertness: Awake/alert Behavior During Therapy: WFL for tasks  assessed/performed Overall Cognitive Status: No family/caregiver present to determine baseline cognitive functioning                                 General Comments: reports she wants to go home if she can move around. repeated stories about fall and son.       General Comments      Exercises Other Exercises Other Exercises: marching at sink Other Exercises: mini squats at sink   Assessment/Plan    PT Assessment Patient needs continued PT services  PT Problem List Decreased strength;Decreased activity tolerance;Decreased balance;Pain;Decreased mobility       PT Treatment Interventions DME instruction;Therapeutic exercise;Gait training;Balance training;Manual techniques;Modalities;Functional mobility training;Therapeutic activities;Patient/family education    PT Goals (Current goals can be found in the Care Plan section)  Acute Rehab PT Goals Patient Stated Goal: decrease pain, go home safely PT Goal Formulation: With patient Time For Goal Achievement: 05/07/19 Potential to Achieve Goals: Fair    Frequency Min 3X/week   Barriers to discharge   lives alone    Co-evaluation               AM-PAC PT "6 Clicks" Mobility  Outcome Measure Help needed turning from your back to your side while in a flat bed without using bedrails?: A Little Help needed moving from lying on your back to sitting on the side of a flat bed without using bedrails?: A Lot Help needed moving to and from a bed to a chair (including a wheelchair)?: A Lot Help needed standing up from a chair using your arms (e.g., wheelchair or bedside chair)?: A Lot Help needed to walk in hospital room?: A Lot Help needed climbing 3-5 steps with a railing? : A Lot 6 Click Score: 13    End of Session Equipment Utilized During Treatment: Gait belt Activity Tolerance: Patient tolerated treatment well Patient left: in chair;with call bell/phone within reach;with chair alarm set   PT Visit  Diagnosis: Unsteadiness on feet (R26.81);Repeated falls (R29.6);Muscle weakness (generalized) (M62.81);History of falling (Z91.81);Difficulty in walking, not elsewhere classified (R26.2);Pain Pain - Right/Left: Right Pain - part of body: Shoulder(Lt groin)    Time: 0240-9735 PT Time Calculation (min) (ACUTE ONLY): 41 min   Charges:   PT Evaluation $PT Eval Moderate Complexity: 1 Mod PT Treatments $Gait Training: 8-22 mins $Therapeutic Exercise: 8-22 mins      Selinda Eon PT, DPT Acute Rehab [phone]

## 2019-04-23 NOTE — Progress Notes (Signed)
Occupational Therapy Evaluation Patient Details Name: EMMERIE BATTAGLIA MRN: [id number] DOB: 1935-01-24 Today's Date: 04/23/2019    History of Present Illness  CALEYAH JR is a 84 y.o. female with medical history significant of HTN, hypothyroidism, COPD, anxiety, chronic low back pain, and IBS; who presents after having a fall.    Clinical Impression   PTA, pt was living at home alone, and reports she was independent with ADL/IADL and modified independent with functional mobility at spc level. Pt currently requires minguard-minA for ADL while seated, pt requires modA for functional mobility at RW level. Pt demonstrated decreased activity tolerance requiring return to sitting after standing for 2 min. Pt reports about 30 "falls" in the past month describing them as "losing her balance" and "falling into walls". Pt states she does not feel safe to return home without support 24/7. She reports she is open to short-term SNF placement, but would prefer having [facility]. Due to decline in current level of function, pt would benefit from acute OT to address established goals to facilitate safe D/C to venue listed below. At this time, recommend SNF follow-up. Will continue to follow acutely.  Orthostatic Blood Pressure:  Lying: 147/63  Sitting: 151/74  Standing: 139/71   SpO2 97% 1.5 lnc while sitting; SpO2 86% RA standing; SpO2 98% 1.5 lnc sitting with pursed lip breathing.       Follow Up Recommendations  SNF;Supervision/Assistance - 24 hour (HHOT if pt able to have 24/7 physical assistance)   Equipment Recommendations  3 in 1 bedside commode;Tub/shower seat    Recommendations for Other Services PT consult     Precautions / Restrictions Precautions Precautions: Fall Restrictions Weight Bearing Restrictions: No      Mobility Bed Mobility Overal bed mobility: Needs Assistance Bed Mobility: Supine to Sit;Rolling Rolling: Mod assist   Supine to sit: Mod assist      General bed mobility comments: pt required modA for rolling to get off/on bed pan  Transfers Overall transfer level: Needs assistance Equipment used: Rolling walker (2 wheeled) Transfers: Sit to/from [facility] Sit to Stand: Min assist Stand pivot transfers: Mod assist       General transfer comment: pt with R foot drop during mobility requiring modA for stability;minA for powerup and vc for safe hand placement    Balance Overall balance assessment: Needs assistance Sitting-balance support: Feet unsupported;No upper extremity supported Sitting balance-Leahy Scale: Fair Sitting balance - Comments: pt able to figure-4 to adjust socks;mild instability required minguard   Standing balance support: Bilateral upper extremity supported Standing balance-Leahy Scale: Poor Standing balance comment: requires UE support during dynamic standing                            ADL either performed or assessed with clinical judgement   ADL Overall ADL's : Needs assistance/impaired Eating/Feeding: Set up;Sitting;Minimal assistance   Grooming: Wash/dry hands;Wash/dry face;Set up;Sitting   Upper Body Bathing: Set up;Sitting   Lower Body Bathing: Minimal assistance;Sit to/from stand;Moderate assistance Lower Body Bathing Details (indicate cue type and reason): min guard for completion while sitting;minA for sit<>stand Upper Body Dressing : Min guard;Sitting   Lower Body Dressing: Minimal assistance;Sit to/from stand;Moderate assistance Lower Body Dressing Details (indicate cue type and reason): pt able to figure-4 to adjust socks;minA to don socks due to pt increased fatigue while sitting EOB Toilet Transfer: Stand-pivot;RW;Minimal assistance   Toileting- Clothing Manipulation and Hygiene: Sit to/from stand;Minimal assistance  Functional mobility during ADLs: Minimal assistance;Moderate assistance;Rolling walker General ADL Comments: pt demonstrates decreased  activity tolerance requiring return to sitting after 75min standing      Vision         Perception     Praxis      Pertinent Vitals/Pain Pain Assessment: 0-10 Pain Score: 10-Worst pain ever Pain Location: lower back  Pain Descriptors / Indicators: Aching;Constant;Discomfort;Grimacing;Guarding Pain Intervention(s): Limited activity within patient's tolerance;Monitored during session;RN gave pain meds during session     Hand Dominance Right   Extremity/Trunk Assessment Upper Extremity Assessment Upper Extremity Assessment: Generalized weakness   Lower Extremity Assessment Lower Extremity Assessment: Defer to PT evaluation;Generalized weakness;RLE deficits/detail RLE Deficits / Details: notable foot drop with ambulation   Cervical / Trunk Assessment Cervical / Trunk Assessment: Normal   Communication Communication Communication: No difficulties   Cognition Arousal/Alertness: Awake/alert Behavior During Therapy: WFL for tasks assessed/performed Overall Cognitive Status: No family/caregiver present to determine baseline cognitive functioning                                 General Comments: pt stated she was at [facility] hospital in [place];unsure pt's prior cognition;pt demonstrated good safety awareness stating she "did not feel safe to return home alone" also states "I know I can't ever live alone again"   General Comments  pt on 1.5lnc;pt with reports of nausea after taking medication from RN, nsg present and aware of difficulty getting medication down;    Exercises     Shoulder Instructions      Home Living Family/patient expects to be discharged to:: Private residence Living Arrangements: Alone Available Help at Discharge: Family;Available PRN/intermittently Type of Home: House Home Access: Stairs to enter [employer] of Steps: 4 Entrance Stairs-Rails: Right;Left Home Layout: One level     Bathroom Shower/Tub: Walk-in shower          Home Equipment: Kasandra Knudsen - quad;Walker - 2 wheels;Grab bars - tub/shower;Hand held shower head;Shower seat          Prior Functioning/Environment Level of Independence: Independent with assistive device(s)  Gait / Transfers Assistance Needed: pt reports using quad cane primarily with intermittent use of RW ADL's / Homemaking Assistance Needed: pt reports she is independent with ADL/IADL states she does not cook but prepares microwave meals   Comments: pt states shes had "30 falls in the past month" describing her "falls" as "losing her balance and falling into walls"        OT Problem List: Decreased strength;Decreased range of motion;Decreased activity tolerance;Impaired balance (sitting and/or standing);Decreased safety awareness;Decreased cognition;Impaired UE functional use      OT Treatment/Interventions: Self-care/ADL training;Therapeutic exercise;Energy conservation;DME and/or AE instruction;Therapeutic activities;Cognitive remediation/compensation;Patient/family education;Balance training    OT Goals(Current goals can be found in the care plan section) Acute Rehab OT Goals Patient Stated Goal: to get back home safely OT Goal Formulation: With patient Time For Goal Achievement: 05/07/19 Potential to Achieve Goals: Good ADL Goals Pt Will Perform Grooming: with modified independence;standing Pt Will Perform Upper Body Bathing: with modified independence;standing Pt Will Perform Lower Body Dressing: with modified independence;sit to/from stand Pt Will Transfer to Toilet: with modified independence;ambulating Pt Will Perform Tub/Shower Transfer: with modified independence  OT Frequency: Min 2X/week   Barriers to D/C: Decreased caregiver support  family available PRN        Co-evaluation              AM-PAC  OT "6 Clicks" Daily Activity     Outcome Measure Help from another person eating meals?: None Help from another person taking care of personal grooming?: A  Little Help from another person toileting, which includes using toliet, bedpan, or urinal?: A Lot Help from another person bathing (including washing, rinsing, drying)?: A Lot Help from another person to put on and taking off regular upper body clothing?: A Little Help from another person to put on and taking off regular lower body clothing?: A Lot 6 Click Score: 16   End of Session Equipment Utilized During Treatment: Gait belt;Oxygen Nurse Communication: Mobility status(pt left on bed pan )  Activity Tolerance: Patient tolerated treatment well Patient left: in bed;with call bell/phone within reach;with bed alarm set;Other (comment)(pt left on bed pan, nsg aware)  OT Visit Diagnosis: Unsteadiness on feet (R26.81);Other abnormalities of gait and mobility (R26.89);Repeated falls (R29.6);History of falling (Z91.81);Muscle weakness (generalized) (M62.81);Pain Pain - part of body: (lower back )                Time: 0912-0959 OT Time Calculation (min): 47 min Charges:  OT General Charges $OT Visit: 1 Visit OT Evaluation $OT Eval Moderate Complexity: 1 Mod OT Treatments $Self Care/Home Management : 23-37 mins  Dorinda Hill OTR/L Acute Rehabilitation Services Office: [place] 04/23/2019, 10:24 AM

## 2019-04-24 LAB — BASIC METABOLIC PANEL
Anion gap: 9 (ref 5–15)
BUN: 7 mg/dL — ABNORMAL LOW (ref 8–23)
CO2: 25 mmol/L (ref 22–32)
Calcium: 8.7 mg/dL — ABNORMAL LOW (ref 8.9–10.3)
Chloride: 104 mmol/L (ref 98–111)
Creatinine, Ser: 0.84 mg/dL (ref 0.44–1.00)
GFR calc Af Amer: >60 mL/min (ref >60)
GFR calc non Af Amer: >60 mL/min (ref >60)
Glucose, Bld: 90 mg/dL (ref 70–99)
Potassium: 3.9 mmol/L (ref 3.5–5.1)
Sodium: 138 mmol/L (ref 135–145)

## 2019-04-24 LAB — MAGNESIUM: Magnesium: 1.6 mg/dL — ABNORMAL LOW (ref 1.7–2.4)

## 2019-04-24 LAB — CK: Total CK: 615 U/L — ABNORMAL HIGH (ref 38–234)

## 2019-04-24 NOTE — Progress Notes (Signed)
Assisted patient to bathroom at approximately 0145 as no BSC present in room. Patient x1 assist with rolling walker. BSC requested. Patient constipated and straining; will inform day RN of need for meds for bowel.    Prior to assisting to bathroom patient removed purwick and stated she was trying to urinate in a cup at the bedside. Informed patient the purpose of purwick and encouraged patient to leave it in place.  Re-educated patient on need to call staff for assistance and not to attempt to get up without assistance.  Notified by tele at [place] that patient HR 47. RN to bedside. Patient assessed; alert and no distress. Notified NP Schorr via page at [phone].

## 2019-04-24 NOTE — Progress Notes (Addendum)
PROGRESS NOTE        PATIENT DETAILS Name: Heather [name] Age: 84 y.o. Sex: female Date of Birth: 1935/09/20 Admit Date: 04/22/2019 Admitting Physician Norval Morton, MD QAS:[id number], Marvis Repress, FNP  Brief Narrative: Patient is a 84 y.o. 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  She was found to have rhabdomyolysis, leukocytosis-and incredibly weak/deconditioned-and was subsequently admitted to the hospitalist service.  Subjective: Crying this morning-apparently talked to her son-and has come to the realization that she may not be able to live alone.  Thinks that she is somewhat better than yesterday-seems to be moving her lower extremities more fluently compared to yesterday.  Claims she ambulated with assistance to the bathroom.  Assessment/Plan: Frequent falls: Has history of frequent falls-on day of admission she fell and got stuck between the commode and shower.  Suspect etiology to be multifactorial-from frailty/chronic deconditioning/advanced age/lower extremity arthritis and underlying lumbar degenerative disease from spinal stenosis.  PT/OT evaluation complete-have consulted CIR. Had spoken with son on 5/16-if SNF required on discharge, he will rather take her to his home with maximum home health services.  CT head/C-spine/L-spine/x-ray pelvis-all negative for fractures.    Rhabdomyolysis: Mild-secondary to prolonged immobilization/lying on the floor after falling.  CK levels are downtrending.  Repeat CK levels periodically.  Worsening CK-start IV fluids (NS at 75 cc an hour)-repeat CK tomorrow.    Hypokalemia: Repleted  Hypothyroidism: TSH within normal limits continue with levothyroxine  COPD: Stable-continue  bronchodilators.  History of right lung neoplasm s/p radiation therapy  Chronic back pain/spinal stenosis: Continue with supportive care-she is very weak/frail and deconditioned-suspect this is best managed medically-she will likely not be a candidate for any sort of surgical intervention at this point.  Debility/deconditioning: Suspect a significant amount of debility at baseline-apparently walks with the help of a walker-suspect she has worsened further from her baseline due to rhabdomyolysis/acute illness.  PT evaluation completed-have consulted CIR.  If felt not to be a CIR candidate-family would like to take her home rather than have her go to SNF.  COVID 19 screen:negative  DVT Prophylaxis: Prophylactic Lovenox   Code Status: DNR-confirmed with the patient's son over the phone, who indicated that patient has a prior existing DNR order.  Family Communication: Spoke with son over the phone on 5/16-unable to get in touch with him today-have left message.  Disposition Plan: Remain inpatient  Antimicrobial agents: Anti-infectives (From admission, onward)   None      Procedures: Nonee  CONSULTS:  None  Time spent: 25- minutes-Greater than 50% of this time was spent in counseling, explanation of diagnosis, planning of further management, and coordination of care.  MEDICATIONS: Scheduled Meds:  diltiazem  240 mg Oral Daily   enoxaparin (LOVENOX) injection  40 mg Subcutaneous Q24H   fluticasone furoate-vilanterol  1 puff Inhalation Daily   levothyroxine  25 mcg Oral Q0600   Continuous Infusions:  sodium chloride 1,000 mL (04/24/19 0914)   PRN Meds:.albuterol, clorazepate, HYDROcodone-acetaminophen, ondansetron **OR** ondansetron (ZOFRAN) IV, zolpidem   PHYSICAL EXAM: Vital signs: Vitals:   04/23/19 0618 04/23/19 1608 04/23/19 2134 04/24/19 0544  BP:  (!) 132/56 (!) 135/55 (!) 125/47  Pulse:  61 (!) 56 (!) 50  Resp:  16 16 16   Temp:  98.9 F (37.2 C) 98.6 F  (37 C) 98.3 F (36.8 C)  TempSrc:  Oral Oral Oral  SpO2: 94% 96% 98% 100%  Weight: 54 kg     Height:       Filed Weights   04/23/19 0618  Weight: 54 kg   Body mass index is 24.87 kg/m.   General appearance:Awake, alert, not in any distress.  Eyes:no scleral icterus. HEENT: Atraumatic and Normocephalic Neck: supple, no JVD. Resp:Good air entry bilaterally,no rales or rhonchi CVS: S1 S2 regular GI: Bowel sounds present, Non tender and not distended with no gaurding, rigidity or rebound. Extremities: B/L Lower Ext shows no edema, both legs are warm to touch Neurology: Has generalized weakness but seems to be more fluently lifting her legs off the bed this morning (compared to yesterday).  Nonfocal exam Psychiatric: Normal judgment and insight. Normal mood. Musculoskeletal:No digital cyanosis Skin:No Rash, warm and dry Wounds:N/A  I have personally reviewed following labs and imaging studies  LABORATORY DATA: CBC: Recent Labs  Lab 04/22/19 0959 04/23/19 0213  WBC 16.5* 8.9  NEUTROABS 14.3*  --   HGB 12.2 10.4*  HCT 39.6 33.0*  MCV 85.9 85.1  PLT 357 762    Basic Metabolic Panel: Recent Labs  Lab 04/22/19 0959 04/23/19 0213 04/24/19 0232  NA 141 141 138  K 3.4* 2.9* 3.9  CL 100 104 104  CO2 29 26 25   GLUCOSE 137* 92 90  BUN 11 9 7*  CREATININE 0.97 0.82 0.84  CALCIUM 10.1 9.0 8.7*  MG  --   --  1.6*    GFR: Estimated Creatinine Clearance: 38.2 mL/min (by C-G formula based on SCr of 0.84 mg/dL).  Liver Function Tests: Recent Labs  Lab 04/22/19 0959  AST 48*  ALT 21  ALKPHOS 102  BILITOT 0.9  PROT 7.8  ALBUMIN 4.8   No results for input(s): LIPASE, AMYLASE in the last 168 hours. No results for input(s): AMMONIA in the last 168 hours.  Coagulation Profile: No results for input(s): INR, PROTIME in the last 168 hours.  Cardiac Enzymes: Recent Labs  Lab 04/22/19 0959 04/23/19 0213 04/24/19 0825  CKTOTAL 800* 1,411* 615*  TROPONINI <0.03   --   --     BNP (last 3 results) Recent Labs    05/31/18 1646  PROBNP 68.0    HbA1C: No results for input(s): HGBA1C in the last 72 hours.  CBG: No results for input(s): GLUCAP in the last 168 hours.  Lipid Profile: No results for input(s): CHOL, HDL, LDLCALC, TRIG, CHOLHDL, LDLDIRECT in the last 72 hours.  Thyroid Function Tests: Recent Labs    04/22/19 1932  TSH 0.761    Anemia Panel: No results for input(s): VITAMINB12, FOLATE, FERRITIN, TIBC, IRON, RETICCTPCT in the last 72 hours.  Urine analysis:    Component Value Date/Time   COLORURINE STRAW (A) 04/22/2019 1138   APPEARANCEUR CLEAR 04/22/2019 1138   LABSPEC 1.010 04/22/2019 1138   PHURINE 6.0 04/22/2019 1138   GLUCOSEU NEGATIVE 04/22/2019 1138   HGBUR TRACE (A) 04/22/2019 1138   HGBUR trace-lysed 11/15/2010 [place] 04/22/2019 [place] 04/22/2019 1138   PROTEINUR NEGATIVE 04/22/2019 1138   UROBILINOGEN 0.2 08/22/2014 1105   NITRITE NEGATIVE 04/22/2019 [place] 04/22/2019 1138    Sepsis Labs: Lactic Acid, Venous    Component Value Date/Time   LATICACIDVEN 1.23 11/17/2015 1938  MICROBIOLOGY: Recent Results (from the past 240 hour(s))  Urine culture     Status: None   Collection Time: 04/22/19  9:48 AM  Result Value Ref Range Status   Specimen Description URINE, RANDOM  Final   Special Requests NONE  Final   Culture   Final    NO GROWTH Performed at [facility], [address] [place], [place] [address]    Report Status 04/23/2019 FINAL  Final  SARS Coronavirus 2 (CEPHEID - Performed in [place] hospital lab), Hosp Order     Status: None   Collection Time: 04/22/19  1:20 PM  Result Value Ref Range Status   SARS Coronavirus 2 NEGATIVE NEGATIVE Final    Comment: (NOTE) If result is NEGATIVE SARS-CoV-2 target nucleic acids are NOT DETECTED. The SARS-CoV-2 RNA is generally detectable in upper and lower  respiratory specimens  during the acute phase of infection. The lowest  concentration of SARS-CoV-2 viral copies this assay can detect is 250  copies / mL. A negative result does not preclude SARS-CoV-2 infection  and should not be used as the sole basis for treatment or other  patient management decisions.  A negative result may occur with  improper specimen collection / handling, submission of specimen other  than nasopharyngeal swab, presence of viral mutation(s) within the  areas targeted by this assay, and inadequate number of viral copies  (<250 copies / mL). A negative result must be combined with clinical  observations, patient history, and epidemiological information. If result is POSITIVE SARS-CoV-2 target nucleic acids are DETECTED. The SARS-CoV-2 RNA is generally detectable in upper and lower  respiratory specimens dur ing the acute phase of infection.  Positive  results are indicative of active infection with SARS-CoV-2.  Clinical  correlation with patient history and other diagnostic information is  necessary to determine patient infection status.  Positive results do  not rule out bacterial infection or co-infection with other viruses. If result is PRESUMPTIVE POSTIVE SARS-CoV-2 nucleic acids MAY BE PRESENT.   A presumptive positive result was obtained on the submitted specimen  and confirmed on repeat testing.  While 2019 novel coronavirus  (SARS-CoV-2) nucleic acids may be present in the submitted sample  additional confirmatory testing may be necessary for epidemiological  and / or clinical management purposes  to differentiate between  SARS-CoV-2 and other Sarbecovirus currently known to infect humans.  If clinically indicated additional testing with an alternate test  methodology [phone]) is advised. The SARS-CoV-2 RNA is generally  detectable in upper and lower respiratory sp ecimens during the acute  phase of infection. The expected result is Negative. Fact Sheet for Patients:   StrictlyIdeas.no Fact Sheet for Healthcare Providers: BankingDealers.co.za This test is not yet approved or cleared by the [place] FDA and has been authorized for detection and/or diagnosis of SARS-CoV-2 by FDA under an Emergency Use Authorization (EUA).  This EUA will remain in effect (meaning this test can be used) for the duration of the COVID-19 declaration under Section 564(b)(1) of the Act, 21 U.S.C. section 360bbb-3(b)(1), unless the authorization is terminated or revoked sooner. Performed at [facility], [place] [address] [place], [place] [address]     RADIOLOGY STUDIES/RESULTS: Ct Head Wo Contrast  Result Date: 04/22/2019 CLINICAL DATA:  Pt arrives from home via EMS. EMS reports that pt was getting up from the toilet and looked like her hand might have slipped and she was halfway stuck between toilet and bathtub. Pain is reported. EXAM:  CT HEAD WITHOUT CONTRAST CT CERVICAL SPINE WITHOUT CONTRAST TECHNIQUE: Multidetector CT imaging of the head and cervical spine was performed following the standard protocol without intravenous contrast. Multiplanar CT image reconstructions of the cervical spine were also generated. COMPARISON:  CT head 08/13/2014. FINDINGS: CT HEAD FINDINGS Brain: No evidence for acute infarction, hemorrhage, mass lesion, hydrocephalus, or extra-axial fluid. Generalized atrophy. Moderate chronic microvascular ischemic change. Vascular: Calcification of the cavernous internal carotid arteries consistent with cerebrovascular atherosclerotic disease. No signs of intracranial large vessel occlusion. Skull: Normal. Negative for fracture or focal lesion. Sinuses/Orbits: No acute finding. Other: None. CT CERVICAL SPINE FINDINGS Alignment: Slight straightening of the normal cervical lordosis. No traumatic subluxation. 2 mm of degenerative anterolisthesis C7-T1 is facet mediated. Skull base and vertebrae: No acute fracture. No  primary bone lesion or focal pathologic process. Soft tissues and spinal canal: No prevertebral fluid or swelling. No visible canal hematoma. Disc levels: C2-3:  Normal. C3-4:  Disc space narrowing. No impingement. C4-5:  Unremarkable. C5-6: Disc space narrowing. Osseous spurring. BILATERAL C6 foraminal narrowing. C6-7: Near complete loss of interspace height. Osseous spurring. BILATERAL C7 foraminal narrowing. C7-T1: 2 mm anterolisthesis. Facet arthropathy. No definite impingement. Upper chest: No mass or pneumothorax. Emphysema. Other: None. IMPRESSION: 1. No skull fracture or intracranial hemorrhage. Atrophy and small vessel disease. 2. No cervical spine fracture or traumatic subluxation. Multilevel spondylosis. Electronically Signed   By: Staci Righter M.D.   On: 04/22/2019 10:57   Ct Cervical Spine Wo Contrast  Result Date: 04/22/2019 CLINICAL DATA:  Pt arrives from home via EMS. EMS reports that pt was getting up from the toilet and looked like her hand might have slipped and she was halfway stuck between toilet and bathtub. Pain is reported. EXAM: CT HEAD WITHOUT CONTRAST CT CERVICAL SPINE WITHOUT CONTRAST TECHNIQUE: Multidetector CT imaging of the head and cervical spine was performed following the standard protocol without intravenous contrast. Multiplanar CT image reconstructions of the cervical spine were also generated. COMPARISON:  CT head 08/13/2014. FINDINGS: CT HEAD FINDINGS Brain: No evidence for acute infarction, hemorrhage, mass lesion, hydrocephalus, or extra-axial fluid. Generalized atrophy. Moderate chronic microvascular ischemic change. Vascular: Calcification of the cavernous internal carotid arteries consistent with cerebrovascular atherosclerotic disease. No signs of intracranial large vessel occlusion. Skull: Normal. Negative for fracture or focal lesion. Sinuses/Orbits: No acute finding. Other: None. CT CERVICAL SPINE FINDINGS Alignment: Slight straightening of the normal cervical  lordosis. No traumatic subluxation. 2 mm of degenerative anterolisthesis C7-T1 is facet mediated. Skull base and vertebrae: No acute fracture. No primary bone lesion or focal pathologic process. Soft tissues and spinal canal: No prevertebral fluid or swelling. No visible canal hematoma. Disc levels: C2-3:  Normal. C3-4:  Disc space narrowing. No impingement. C4-5:  Unremarkable. C5-6: Disc space narrowing. Osseous spurring. BILATERAL C6 foraminal narrowing. C6-7: Near complete loss of interspace height. Osseous spurring. BILATERAL C7 foraminal narrowing. C7-T1: 2 mm anterolisthesis. Facet arthropathy. No definite impingement. Upper chest: No mass or pneumothorax. Emphysema. Other: None. IMPRESSION: 1. No skull fracture or intracranial hemorrhage. Atrophy and small vessel disease. 2. No cervical spine fracture or traumatic subluxation. Multilevel spondylosis. Electronically Signed   By: Staci Righter M.D.   On: 04/22/2019 10:57   Ct Lumbar Spine Wo Contrast  Result Date: 04/22/2019 CLINICAL DATA:  Pt arrives from home via EMS. EMS reports that pt was getting up from the toilet and looked like her hand might have slipped and she was halfway stuck between toilet and bathtub. Pain is reported. EXAM:  CT LUMBAR SPINE WITHOUT CONTRAST TECHNIQUE: Multidetector CT imaging of the lumbar spine was performed without intravenous contrast administration. Multiplanar CT image reconstructions were also generated. COMPARISON:  CT of the abdomen and pelvis with sagittal reformatted images, 04/12/2018. FINDINGS: Segmentation: 5 lumbar type vertebrae. Alignment: Trace retrolisthesis L3-4, nontraumatic. Vertebrae: Chronic endplate deformity inferior L3, unchanged from 2019. Paraspinal and other soft tissues: Incompletely evaluated, but clearly worsened, RIGHT lower lobe process, either pneumonia or tumor progression. Disc levels: No visible traumatic disc herniation. Moderate multifactorial spinal stenosis at L4-5 related to  posterior element hypertrophy, and central disc protrusion. Additionally, central and leftward protrusion is noted L5-S1 with asymmetric facet arthropathy. IMPRESSION: 1. Moderate multifactorial spinal stenosis at L4-5 related to posterior element hypertrophy, and central disc protrusion; additional central and leftward protrusion at L5-S1. Neither appear posttraumatic. 2. Chronic endplate deformity L3. No lumbar compression fracture or traumatic subluxation. 3. Incompletely evaluated, but clearly worsened RIGHT lower lobe process, either pneumonia or tumor progression. Electronically Signed   By: Staci Righter M.D.   On: 04/22/2019 10:45   Dg Pelvis Portable  Result Date: 04/22/2019 CLINICAL DATA:  Fall. EXAM: PORTABLE PELVIS 1-2 VIEWS COMPARISON:  None. FINDINGS: There is no evidence of pelvic fracture or diastasis. No pelvic bone lesions are seen. IMPRESSION: Negative. Electronically Signed   By: Marijo Conception M.D.   On: 04/22/2019 10:37   Dg Chest Portable 1 View  Result Date: 04/22/2019 CLINICAL DATA:  Fall. EXAM: PORTABLE CHEST 1 VIEW COMPARISON:  Radiographs of January 19, 2019. CT scan of February 18, 2019. FINDINGS: The heart size and mediastinal contours are within normal limits. No pneumothorax or pleural effusion is noted. Left lung is clear. Stable nodular density is noted in right lower lobe consistent with neoplasm and associated fibrosis or scarring. The visualized skeletal structures are unremarkable. IMPRESSION: Stable right lower lobe nodule with surrounding fibrosis or scarring. No traumatic abnormality seen in the chest. Electronically Signed   By: Marijo Conception M.D.   On: 04/22/2019 11:46     LOS: 1 day   Oren Binet, MD  [facility]  If 7PM-7AM, please contact night-coverage  Please page via [email or web address]  Go to [email or web address] and use [facility]'s universal password to access. If you do not have the password, please contact the hospital operator.  Locate the [facility]  provider you are looking for under [facility] and page to a number that you can be directly reached. If you still have difficulty reaching the provider, please page the [facility] (Director on Call) for the Hospitalists listed on amion for assistance.  04/24/2019, 1:22 PM

## 2019-04-25 LAB — BASIC METABOLIC PANEL
Anion gap: 11 (ref 5–15)
BUN: 7 mg/dL — ABNORMAL LOW (ref 8–23)
CO2: 27 mmol/L (ref 22–32)
Calcium: 9.5 mg/dL (ref 8.9–10.3)
Chloride: 103 mmol/L (ref 98–111)
Creatinine, Ser: 0.9 mg/dL (ref 0.44–1.00)
GFR calc Af Amer: >60 mL/min (ref >60)
GFR calc non Af Amer: 60 mL/min — ABNORMAL LOW (ref >60)
Glucose, Bld: 96 mg/dL (ref 70–99)
Potassium: 4.1 mmol/L (ref 3.5–5.1)
Sodium: 141 mmol/L (ref 135–145)

## 2019-04-25 LAB — CK: Total CK: 411 U/L — ABNORMAL HIGH (ref 38–234)

## 2019-04-25 MED ORDER — SENNOSIDES-DOCUSATE SODIUM 8.6-50 MG PO TABS: 2.0000 | ORAL_TABLET | Freq: Every day | ORAL | Status: DC

## 2019-04-25 MED ORDER — POLYETHYLENE GLYCOL 3350 17 G PO PACK
17.0000 g | PACK | Freq: Once | ORAL | Status: AC
  Administered 2019-04-25: 17 g via ORAL
  Filled 2019-04-25: qty 1

## 2019-04-25 NOTE — Progress Notes (Signed)
Pharmacy is unable to confirm the medications the patient was taking at home. All options have been exhausted and a resolution to the situation is not expected.   Pt cannot assist with Med Hx. She lives alone and manages her own meds. Pt's son cannot assist either.  Where possible, their outpatient pharmacy(s) have been contacted for the last time prescriptions were filled and that information has been added to each medication in an Order Note (highlighted yellow below the medication).  Please contact pharmacy if further assistance is needed.   Romeo Rabon, PharmD. Mobile: [phone]. 04/25/2019,9:14 AM.

## 2019-04-25 NOTE — Discharge Summary (Signed)
Physician Discharge Summary  Heather [name] UEA:[id number] DOB: 01-Sep-1935 DOA: 04/22/2019  PCP: Marrian Salvage, FNP  Admit date: 04/22/2019  Discharge date: 04/25/2019  Admitted From:Home  Disposition:  Home  Recommendations for Outpatient Follow-up:  1. Follow up with PCP in 1-2 weeks 2. Follow-up BMP and CK levels in 1 week  Home Health:Yes with PT  Equipment/Devices:Rolling walker  Discharge Condition:Stable  CODE STATUS: DNR  Diet recommendation: Heart Healthy  Brief/Interim Summary: Per HPI: Patient is a 84 y.o. 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  She was found to have rhabdomyolysis, leukocytosis-and incredibly weak/deconditioned-and was subsequently admitted to the hospitalist service.  Patient was admitted with rhabdomyolysis secondary to frequent falls and has been assessed by PT with recommendations to go to CIR for rehabilitation, but refuses and would like to go home with home health and this was discussed with her son.  She will actually be living with her son at this point in time and will have home health services at his home with recommendations for a rolling walker.  CK levels on discharge are 411 and these have improved with IV fluid hydration.  Discharge Diagnoses:  Principal Problem:   Fall Active Problems:   Hypothyroidism   Anxiety   COPD with emphysema ([place])   Hypokalemia   Elevated CK   Leukocytosis    Discharge Instructions  Discharge Instructions    Diet - low sodium heart healthy   Complete by:  As directed    Increase activity slowly   Complete by:  As directed      Allergies as of 04/25/2019      Reactions   Tylenol [acetaminophen] Other (See Comments)   Nightmares   Symbicort [budesonide-formoterol  Fumarate] Other (See Comments)   Pt felt like her tongue was swollen, could not swallow      Medication List    TAKE these medications   Albuterol Sulfate 108 (90 Base) MCG/ACT Aepb Commonly known as:  ProAir RespiClick Inhale 2 puffs into the lungs 4 (four) times daily as needed. What changed:  reasons to take this   benzonatate 100 MG capsule Commonly known as:  TESSALON TAKE 1 CAPSULE TWICE DAILY AS NEEDED FOR COUGH. What changed:  See the new instructions.   clorazepate 15 MG tablet Commonly known as:  TRANXENE Take 1 tablet (15 mg total) by mouth 2 (two) times daily. What changed:    when to take this  reasons to take this   diltiazem 240 MG 24 hr capsule Commonly known as:  CARDIZEM CD Take 1 capsule (240 mg total) by mouth daily.   esomeprazole 40 MG capsule Commonly known as:  NexIUM Take 1 capsule (40 mg total) by mouth daily.   fluticasone furoate-vilanterol 200-25 MCG/INH Aepb Commonly known as:  Breo Ellipta Inhale 1 puff into the lungs daily.   folic acid 1 MG tablet Commonly known as:  FOLVITE TAKE 1 TABLET ONCE DAILY.   furosemide 20 MG tablet Commonly known as:  LASIX TAKE 1 TABLET DAILY IF NEEDED FOR SIGNIFICANT LOWER EXTREMITY SWELLING.   HYDROcodone-acetaminophen 5-325 MG tablet Commonly known as:  NORCO/VICODIN TAKE 1 TABLET EVERY 6-8 HOURS AS NEEDED FOR SEVERE PAIN NOT CONTROLLED BY TRAMADOL. What changed:    how much to take  how to take this  when  to take this  reasons to take this  additional instructions   levothyroxine 25 MCG tablet Commonly known as:  SYNTHROID TAKE 1 TABLET DAILY BEFORE BREAKFAST.   multivitamin with minerals Tabs tablet Take 1 tablet by mouth daily.   promethazine 25 MG tablet Commonly known as:  PHENERGAN Take 1 tablet (25 mg total) by mouth every 8 (eight) hours as needed for nausea or vomiting.   senna-docusate 8.6-50 MG tablet Commonly known as:  Senokot-S Take 2 tablets by mouth at bedtime.    sertraline 50 MG tablet Commonly known as:  ZOLOFT TAKE 1 TABLET EACH DAY. What changed:  See the new instructions.   SYSTANE OP Place 1 drop into both eyes 2 (two) times daily as needed (for dry eyes).   traMADol 50 MG tablet Commonly known as:  ULTRAM Take 1 tablet (50 mg total) by mouth every 6 (six) hours as needed. What changed:  reasons to take this   traZODone 50 MG tablet Commonly known as:  DESYREL TAKE 1/2 TO 1 TABLET AT BEDTIME AS NEEDED FOR REST. What changed:  See the new instructions.   zolpidem 5 MG tablet Commonly known as:  AMBIEN TAKE 1 TABLET AT BEDTIME AS NEEDED. What changed:  reasons to take this            Durable Medical Equipment  (From admission, onward)         Start     Ordered   04/25/19 1158  For home use only DME Walker rolling  [facility])  Once    Question:  Patient needs a walker to treat with the following condition  Answer:  Weakness   04/25/19 [place], [name] Woodruff, FNP Follow up in 1 week(s).   Specialty:  Internal Medicine Contact information: [address] [place] [address] [phone]          Allergies  Allergen Reactions  . Tylenol [Acetaminophen] Other (See Comments)    Nightmares  . Symbicort [Budesonide-Formoterol Fumarate] Other (See Comments)    Pt felt like her tongue was swollen, could not swallow    Consultations:  None   Procedures/Studies: Ct Head Wo Contrast  Result Date: 04/22/2019 CLINICAL DATA:  Pt arrives from home via EMS. EMS reports that pt was getting up from the toilet and looked like her hand might have slipped and she was halfway stuck between toilet and bathtub. Pain is reported. EXAM: CT HEAD WITHOUT CONTRAST CT CERVICAL SPINE WITHOUT CONTRAST TECHNIQUE: Multidetector CT imaging of the head and cervical spine was performed following the standard protocol without intravenous contrast. Multiplanar CT image reconstructions of the cervical  spine were also generated. COMPARISON:  CT head 08/13/2014. FINDINGS: CT HEAD FINDINGS Brain: No evidence for acute infarction, hemorrhage, mass lesion, hydrocephalus, or extra-axial fluid. Generalized atrophy. Moderate chronic microvascular ischemic change. Vascular: Calcification of the cavernous internal carotid arteries consistent with cerebrovascular atherosclerotic disease. No signs of intracranial large vessel occlusion. Skull: Normal. Negative for fracture or focal lesion. Sinuses/Orbits: No acute finding. Other: None. CT CERVICAL SPINE FINDINGS Alignment: Slight straightening of the normal cervical lordosis. No traumatic subluxation. 2 mm of degenerative anterolisthesis C7-T1 is facet mediated. Skull base and vertebrae: No acute fracture. No primary bone lesion or focal pathologic process. Soft tissues and spinal canal: No prevertebral fluid or swelling. No visible canal hematoma. Disc levels: C2-3:  Normal. C3-4:  Disc space narrowing. No impingement. C4-5:  Unremarkable. C5-6:  Disc space narrowing. Osseous spurring. BILATERAL C6 foraminal narrowing. C6-7: Near complete loss of interspace height. Osseous spurring. BILATERAL C7 foraminal narrowing. C7-T1: 2 mm anterolisthesis. Facet arthropathy. No definite impingement. Upper chest: No mass or pneumothorax. Emphysema. Other: None. IMPRESSION: 1. No skull fracture or intracranial hemorrhage. Atrophy and small vessel disease. 2. No cervical spine fracture or traumatic subluxation. Multilevel spondylosis. Electronically Signed   By: Staci Righter M.D.   On: 04/22/2019 10:57   Ct Cervical Spine Wo Contrast  Result Date: 04/22/2019 CLINICAL DATA:  Pt arrives from home via EMS. EMS reports that pt was getting up from the toilet and looked like her hand might have slipped and she was halfway stuck between toilet and bathtub. Pain is reported. EXAM: CT HEAD WITHOUT CONTRAST CT CERVICAL SPINE WITHOUT CONTRAST TECHNIQUE: Multidetector CT imaging of the head and  cervical spine was performed following the standard protocol without intravenous contrast. Multiplanar CT image reconstructions of the cervical spine were also generated. COMPARISON:  CT head 08/13/2014. FINDINGS: CT HEAD FINDINGS Brain: No evidence for acute infarction, hemorrhage, mass lesion, hydrocephalus, or extra-axial fluid. Generalized atrophy. Moderate chronic microvascular ischemic change. Vascular: Calcification of the cavernous internal carotid arteries consistent with cerebrovascular atherosclerotic disease. No signs of intracranial large vessel occlusion. Skull: Normal. Negative for fracture or focal lesion. Sinuses/Orbits: No acute finding. Other: None. CT CERVICAL SPINE FINDINGS Alignment: Slight straightening of the normal cervical lordosis. No traumatic subluxation. 2 mm of degenerative anterolisthesis C7-T1 is facet mediated. Skull base and vertebrae: No acute fracture. No primary bone lesion or focal pathologic process. Soft tissues and spinal canal: No prevertebral fluid or swelling. No visible canal hematoma. Disc levels: C2-3:  Normal. C3-4:  Disc space narrowing. No impingement. C4-5:  Unremarkable. C5-6: Disc space narrowing. Osseous spurring. BILATERAL C6 foraminal narrowing. C6-7: Near complete loss of interspace height. Osseous spurring. BILATERAL C7 foraminal narrowing. C7-T1: 2 mm anterolisthesis. Facet arthropathy. No definite impingement. Upper chest: No mass or pneumothorax. Emphysema. Other: None. IMPRESSION: 1. No skull fracture or intracranial hemorrhage. Atrophy and small vessel disease. 2. No cervical spine fracture or traumatic subluxation. Multilevel spondylosis. Electronically Signed   By: Staci Righter M.D.   On: 04/22/2019 10:57   Ct Lumbar Spine Wo Contrast  Result Date: 04/22/2019 CLINICAL DATA:  Pt arrives from home via EMS. EMS reports that pt was getting up from the toilet and looked like her hand might have slipped and she was halfway stuck between toilet and  bathtub. Pain is reported. EXAM: CT LUMBAR SPINE WITHOUT CONTRAST TECHNIQUE: Multidetector CT imaging of the lumbar spine was performed without intravenous contrast administration. Multiplanar CT image reconstructions were also generated. COMPARISON:  CT of the abdomen and pelvis with sagittal reformatted images, 04/12/2018. FINDINGS: Segmentation: 5 lumbar type vertebrae. Alignment: Trace retrolisthesis L3-4, nontraumatic. Vertebrae: Chronic endplate deformity inferior L3, unchanged from 2019. Paraspinal and other soft tissues: Incompletely evaluated, but clearly worsened, RIGHT lower lobe process, either pneumonia or tumor progression. Disc levels: No visible traumatic disc herniation. Moderate multifactorial spinal stenosis at L4-5 related to posterior element hypertrophy, and central disc protrusion. Additionally, central and leftward protrusion is noted L5-S1 with asymmetric facet arthropathy. IMPRESSION: 1. Moderate multifactorial spinal stenosis at L4-5 related to posterior element hypertrophy, and central disc protrusion; additional central and leftward protrusion at L5-S1. Neither appear posttraumatic. 2. Chronic endplate deformity L3. No lumbar compression fracture or traumatic subluxation. 3. Incompletely evaluated, but clearly worsened RIGHT lower lobe process, either pneumonia or tumor progression. Electronically Signed   By:  Staci Righter M.D.   On: 04/22/2019 10:45   Dg Pelvis Portable  Result Date: 04/22/2019 CLINICAL DATA:  Fall. EXAM: PORTABLE PELVIS 1-2 VIEWS COMPARISON:  None. FINDINGS: There is no evidence of pelvic fracture or diastasis. No pelvic bone lesions are seen. IMPRESSION: Negative. Electronically Signed   By: Marijo Conception M.D.   On: 04/22/2019 10:37   Dg Chest Portable 1 View  Result Date: 04/22/2019 CLINICAL DATA:  Fall. EXAM: PORTABLE CHEST 1 VIEW COMPARISON:  Radiographs of January 19, 2019. CT scan of February 18, 2019. FINDINGS: The heart size and mediastinal contours  are within normal limits. No pneumothorax or pleural effusion is noted. Left lung is clear. Stable nodular density is noted in right lower lobe consistent with neoplasm and associated fibrosis or scarring. The visualized skeletal structures are unremarkable. IMPRESSION: Stable right lower lobe nodule with surrounding fibrosis or scarring. No traumatic abnormality seen in the chest. Electronically Signed   By: Marijo Conception M.D.   On: 04/22/2019 11:46     Discharge Exam: Vitals:   04/25/19 0939 04/25/19 0953  BP:  140/64  Pulse: 70 65  Resp: 18   Temp:    SpO2: 94%    Vitals:   04/24/19 2137 04/25/19 0614 04/25/19 0939 04/25/19 0953  BP: (!) 127/56 (!) 142/48  140/64  Pulse: 68 61 70 65  Resp: 16 16 18    Temp: 99 F (37.2 C) 98.3 F (36.8 C)    TempSrc: Oral Oral    SpO2: (!) 88% 91% 94%   Weight:      Height:        General: Pt is alert, awake, not in acute distress Cardiovascular: RRR, S1/S2 +, no rubs, no gallops Respiratory: CTA bilaterally, no wheezing, no rhonchi Abdominal: Soft, NT, ND, bowel sounds + Extremities: no edema, no cyanosis    The results of significant diagnostics from this hospitalization (including imaging, microbiology, ancillary and laboratory) are listed below for reference.     Microbiology: Recent Results (from the past 240 hour(s))  Urine culture     Status: None   Collection Time: 04/22/19  9:48 AM  Result Value Ref Range Status   Specimen Description URINE, RANDOM  Final   Special Requests NONE  Final   Culture   Final    NO GROWTH Performed at [facility], [address], [place], [place] [address]    Report Status 04/23/2019 FINAL  Final  SARS Coronavirus 2 (CEPHEID - Performed in [place] hospital lab), Hosp Order     Status: None   Collection Time: 04/22/19  1:20 PM  Result Value Ref Range Status   SARS Coronavirus 2 NEGATIVE NEGATIVE Final    Comment: (NOTE) If result is NEGATIVE SARS-CoV-2 target nucleic acids are  NOT DETECTED. The SARS-CoV-2 RNA is generally detectable in upper and lower  respiratory specimens during the acute phase of infection. The lowest  concentration of SARS-CoV-2 viral copies this assay can detect is 250  copies / mL. A negative result does not preclude SARS-CoV-2 infection  and should not be used as the sole basis for treatment or other  patient management decisions.  A negative result may occur with  improper specimen collection / handling, submission of specimen other  than nasopharyngeal swab, presence of viral mutation(s) within the  areas targeted by this assay, and inadequate number of viral copies  (<250 copies / mL). A negative result must be combined with clinical  observations, patient history,  and epidemiological information. If result is POSITIVE SARS-CoV-2 target nucleic acids are DETECTED. The SARS-CoV-2 RNA is generally detectable in upper and lower  respiratory specimens dur ing the acute phase of infection.  Positive  results are indicative of active infection with SARS-CoV-2.  Clinical  correlation with patient history and other diagnostic information is  necessary to determine patient infection status.  Positive results do  not rule out bacterial infection or co-infection with other viruses. If result is PRESUMPTIVE POSTIVE SARS-CoV-2 nucleic acids MAY BE PRESENT.   A presumptive positive result was obtained on the submitted specimen  and confirmed on repeat testing.  While 2019 novel coronavirus  (SARS-CoV-2) nucleic acids may be present in the submitted sample  additional confirmatory testing may be necessary for epidemiological  and / or clinical management purposes  to differentiate between  SARS-CoV-2 and other Sarbecovirus currently known to infect humans.  If clinically indicated additional testing with an alternate test  methodology [phone]) is advised. The SARS-CoV-2 RNA is generally  detectable in upper and lower respiratory sp ecimens  during the acute  phase of infection. The expected result is Negative. Fact Sheet for Patients:  StrictlyIdeas.no Fact Sheet for Healthcare Providers: BankingDealers.co.za This test is not yet approved or cleared by the [place] FDA and has been authorized for detection and/or diagnosis of SARS-CoV-2 by FDA under an Emergency Use Authorization (EUA).  This EUA will remain in effect (meaning this test can be used) for the duration of the COVID-19 declaration under Section 564(b)(1) of the Act, 21 U.S.C. section 360bbb-3(b)(1), unless the authorization is terminated or revoked sooner. Performed at [facility], [place] [address] [place], [place] [address]      Labs: BNP (last 3 results) No results for input(s): BNP in the last 8760 hours. Basic Metabolic Panel: Recent Labs  Lab 04/22/19 0959 04/23/19 0213 04/24/19 0232 04/25/19 0428  NA 141 141 138 141  K 3.4* 2.9* 3.9 4.1  CL 100 104 104 103  CO2 29 26 25 27   GLUCOSE 137* 92 90 96  BUN 11 9 7* 7*  CREATININE 0.97 0.82 0.84 0.90  CALCIUM 10.1 9.0 8.7* 9.5  MG  --   --  1.6*  --    Liver Function Tests: Recent Labs  Lab 04/22/19 0959  AST 48*  ALT 21  ALKPHOS 102  BILITOT 0.9  PROT 7.8  ALBUMIN 4.8   No results for input(s): LIPASE, AMYLASE in the last 168 hours. No results for input(s): AMMONIA in the last 168 hours. CBC: Recent Labs  Lab 04/22/19 0959 04/23/19 0213  WBC 16.5* 8.9  NEUTROABS 14.3*  --   HGB 12.2 10.4*  HCT 39.6 33.0*  MCV 85.9 85.1  PLT 357 293   Cardiac Enzymes: Recent Labs  Lab 04/22/19 0959 04/23/19 0213 04/24/19 0825 04/25/19 0428  CKTOTAL 800* 1,411* 615* 411*  TROPONINI <0.03  --   --   --    BNP: Invalid input(s): POCBNP CBG: No results for input(s): GLUCAP in the last 168 hours. D-Dimer No results for input(s): DDIMER in the last 72 hours. Hgb A1c No results for input(s): HGBA1C in the last 72 hours. Lipid  Profile No results for input(s): CHOL, HDL, LDLCALC, TRIG, CHOLHDL, LDLDIRECT in the last 72 hours. Thyroid function studies Recent Labs    04/22/19 1932  TSH 0.761   Anemia work up No results for input(s): VITAMINB12, FOLATE, FERRITIN, TIBC, IRON, RETICCTPCT in the last 72 hours. Urinalysis  Component Value Date/Time   COLORURINE STRAW (A) 04/22/2019 1138   APPEARANCEUR CLEAR 04/22/2019 1138   LABSPEC 1.010 04/22/2019 1138   PHURINE 6.0 04/22/2019 1138   GLUCOSEU NEGATIVE 04/22/2019 1138   HGBUR TRACE (A) 04/22/2019 1138   HGBUR trace-lysed 11/15/2010 [place] 04/22/2019 1138   KETONESUR NEGATIVE 04/22/2019 1138   PROTEINUR NEGATIVE 04/22/2019 1138   UROBILINOGEN 0.2 08/22/2014 1105   NITRITE NEGATIVE 04/22/2019 1138   LEUKOCYTESUR NEGATIVE 04/22/2019 1138   Sepsis Labs Invalid input(s): PROCALCITONIN,  WBC,  LACTICIDVEN Microbiology Recent Results (from the past 240 hour(s))  Urine culture     Status: None   Collection Time: 04/22/19  9:48 AM  Result Value Ref Range Status   Specimen Description URINE, RANDOM  Final   Special Requests NONE  Final   Culture   Final    NO GROWTH Performed at [facility], [place] [address] [place], [place] [address]    Report Status 04/23/2019 FINAL  Final  SARS Coronavirus 2 (CEPHEID - Performed in [place] hospital lab), Hosp Order     Status: None   Collection Time: 04/22/19  1:20 PM  Result Value Ref Range Status   SARS Coronavirus 2 NEGATIVE NEGATIVE Final    Comment: (NOTE) If result is NEGATIVE SARS-CoV-2 target nucleic acids are NOT DETECTED. The SARS-CoV-2 RNA is generally detectable in upper and lower  respiratory specimens during the acute phase of infection. The lowest  concentration of SARS-CoV-2 viral copies this assay can detect is 250  copies / mL. A negative result does not preclude SARS-CoV-2 infection  and should not be used as the sole basis for treatment or other  patient management  decisions.  A negative result may occur with  improper specimen collection / handling, submission of specimen other  than nasopharyngeal swab, presence of viral mutation(s) within the  areas targeted by this assay, and inadequate number of viral copies  (<250 copies / mL). A negative result must be combined with clinical  observations, patient history, and epidemiological information. If result is POSITIVE SARS-CoV-2 target nucleic acids are DETECTED. The SARS-CoV-2 RNA is generally detectable in upper and lower  respiratory specimens dur ing the acute phase of infection.  Positive  results are indicative of active infection with SARS-CoV-2.  Clinical  correlation with patient history and other diagnostic information is  necessary to determine patient infection status.  Positive results do  not rule out bacterial infection or co-infection with other viruses. If result is PRESUMPTIVE POSTIVE SARS-CoV-2 nucleic acids MAY BE PRESENT.   A presumptive positive result was obtained on the submitted specimen  and confirmed on repeat testing.  While 2019 novel coronavirus  (SARS-CoV-2) nucleic acids may be present in the submitted sample  additional confirmatory testing may be necessary for epidemiological  and / or clinical management purposes  to differentiate between  SARS-CoV-2 and other Sarbecovirus currently known to infect humans.  If clinically indicated additional testing with an alternate test  methodology [phone]) is advised. The SARS-CoV-2 RNA is generally  detectable in upper and lower respiratory sp ecimens during the acute  phase of infection. The expected result is Negative. Fact Sheet for Patients:  StrictlyIdeas.no Fact Sheet for Healthcare Providers: BankingDealers.co.za This test is not yet approved or cleared by the [place] FDA and has been authorized for detection and/or diagnosis of SARS-CoV-2 by FDA under an  Emergency Use Authorization (EUA).  This EUA will remain in effect (meaning this test can  be used) for the duration of the COVID-19 declaration under Section 564(b)(1) of the Act, 21 U.S.C. section 360bbb-3(b)(1), unless the authorization is terminated or revoked sooner. Performed at [facility], [place] [address] [place], [place] [address]      Time coordinating discharge: 35 minutes  SIGNED:   Rodena Goldmann, DO [facility] 04/25/2019, 11:58 AM  If 7PM-7AM, please contact night-coverage [email or web address] Password [email or web address]

## 2019-04-25 NOTE — Plan of Care (Signed)
  Problem: Education: Goal: Knowledge of General Education information will improve Description Including pain rating scale, medication(s)/side effects and non-pharmacologic comfort measures Outcome: Progressing   Problem: Health Behavior/Discharge Planning: Goal: Ability to manage health-related needs will improve Outcome: Adequate for Discharge   Problem: Clinical Measurements: Goal: Will remain free from infection Outcome: Completed/Met   Problem: Activity: Goal: Risk for activity intolerance will decrease Outcome: Adequate for Discharge  Pt is still unsteady on her feet & weak but refuses rehab. Family aware.   Problem: Nutrition: Goal: Adequate nutrition will be maintained Outcome: Completed/Met   Problem: Elimination: Goal: Will not experience complications related to bowel motility Outcome: Adequate for Discharge Goal: Will not experience complications related to urinary retention Outcome: Completed/Met   Problem: Pain Managment: Goal: General experience of comfort will improve Outcome: Adequate for Discharge  Pt has chronic pain.

## 2019-04-25 NOTE — Progress Notes (Signed)
Physical Therapy Treatment Patient Details Name: Heather [name] MRN: [id number] DOB: 08-14-1935 Today's Date: 04/25/2019    History of Present Illness Pt is a 85 yo F with primary problem of acute fall. PMH significant for hypothyroidism, anxiety, COPD, kypokalemia, elevated CK, leukocytosis, LBP. Also complains of Left groin pain and Right shoulder pain.     PT Comments    Patient seen for mobility progression. Pt requires mod A for bed mobility and min/mod A for gait training. Given pt's mobility level and cognitive deficits noted during session recommend further skilled therapies to maximize independence and safety with mobility.     Follow Up Recommendations  CIR     Equipment Recommendations  Rolling walker with 5" wheels    Recommendations for Other Services       Precautions / Restrictions Precautions Precautions: Fall    Mobility  Bed Mobility Overal bed mobility: Needs Assistance Bed Mobility: Sidelying to Sit   Sidelying to sit: Mod assist;HOB elevated       General bed mobility comments: pt in side lying upon arrival; pt requires assistance to elevate trunk into sitting   Transfers Overall transfer level: Needs assistance Equipment used: Rolling walker (2 wheeled) Transfers: Sit to/from Stand Sit to Stand: Min assist         General transfer comment: assist to power up into standing; cues for safe hand placement/positioning prior to stand  Ambulation/Gait Ambulation/Gait assistance: Min assist Gait Distance (Feet): 75 Feet Assistive device: Rolling walker (2 wheeled) Gait Pattern/deviations: Step-through pattern;Decreased stride length;Drifts right/left;Trunk flexed     General Gait Details: cues for upright posture and for safe use of AD; pt with tendency to stay outside of RW especially when turning   Stairs             Wheelchair Mobility    Modified Rankin (Stroke Patients Only)       Balance Overall balance assessment:  Needs assistance;History of Falls   Sitting balance-Leahy Scale: Fair     Standing balance support: Bilateral upper extremity supported Standing balance-Leahy Scale: Poor                              Cognition Arousal/Alertness: Awake/alert Behavior During Therapy: WFL for tasks assessed/performed Overall Cognitive Status: No family/caregiver present to determine baseline cognitive functioning                                 General Comments: history of falls; appears to have decreased short term memory and requires cues for completion of tasks/initiation; pt took a list of phone numbers with her when ambulating stating "in case I need them". pt needs directional cues to navigate environment and easily distracted in hallway      Exercises      General Comments General comments (skin integrity, edema, etc.): pt c/o dizziness while mobilizing       Pertinent Vitals/Pain Pain Assessment: Faces Faces Pain Scale: Hurts little more Pain Location: back Pain Descriptors / Indicators: Aching;Grimacing;Guarding Pain Intervention(s): Limited activity within patient's tolerance;Monitored during session;Repositioned    Home Living                      Prior Function            PT Goals (current goals can now be found in the care plan section) Progress towards PT goals: Progressing  toward goals    Frequency    Min 3X/week      PT Plan Current plan remains appropriate    Co-evaluation              AM-PAC PT "6 Clicks" Mobility   Outcome Measure  Help needed turning from your back to your side while in a flat bed without using bedrails?: A Little Help needed moving from lying on your back to sitting on the side of a flat bed without using bedrails?: A Lot Help needed moving to and from a bed to a chair (including a wheelchair)?: A Little Help needed standing up from a chair using your arms (e.g., wheelchair or bedside chair)?: A  Little Help needed to walk in hospital room?: A Little Help needed climbing 3-5 steps with a railing? : A Lot 6 Click Score: 16    End of Session Equipment Utilized During Treatment: Gait belt Activity Tolerance: Patient tolerated treatment well Patient left: in chair;with call bell/phone within reach;with chair alarm set Nurse Communication: Mobility status PT Visit Diagnosis: Unsteadiness on feet (R26.81);Repeated falls (R29.6);Muscle weakness (generalized) (M62.81);History of falling (Z91.81);Difficulty in walking, not elsewhere classified (R26.2);Pain Pain - Right/Left: Right Pain - part of body: Shoulder(Lt groin, back)     Time: 2694-8546 PT Time Calculation (min) (ACUTE ONLY): 24 min  Charges:  $Gait Training: 23-37 mins                     Earney Navy, PTA Acute Rehabilitation Services Pager: [phone] Office: [phone]     Darliss Cheney 04/25/2019, 1:45 PM

## 2019-04-25 NOTE — Progress Notes (Signed)
Inpatient Rehabilitation Admissions Coordinator  Inpatient Rehab Consult received. I met with patient at the bedside for rehabilitation assessment. She adamantly refuses any rehab except for d/c home. I contacted her son, and he states arrangements are being made for his Mom to d/c home today. We will sign off at this time.  Danne Baxter, RN, MSN Rehab Admissions Coordinator [phone] 04/25/2019 12:22 PM

## 2019-04-25 NOTE — Progress Notes (Signed)
Discharge instructions given to pt as well as son (steven, via telephone). Pt's son stated he did not have any questions. Pt's iv was removed & belongings were gathered & sent with pt. Hoover Brunette, RN

## 2019-04-25 NOTE — TOC Transition Note (Signed)
Transition of Care [facility]) - CM/SW Discharge Note   Patient Details  Name: Heather [name] MRN: [id number] Date of Birth: 26-Jan-1935  Transition of Care [facility]) CM/SW Contact:  Sharin Mons, RN Phone Number: 04/25/2019, 12:14 PM   Clinical Narrative:   Admitted s/p fall/ rhabdomyolysis, hx of HTN, hypothyroidism, COPD, neoplasm involving the right lung s/p radiation therapy, chronic back pain, IBS.   Pt will transition  to son's home ([address] [place]). Home health services [facility]) to follow. Son to provide transportation to home.  Final next level of care: [facility](declined CIR) Barriers to Discharge: Barriers Resolved   Patient Goals and CMS Choice Patient states their goals for this hospitalization and ongoing recovery are:: I want to go home CMS Medicare.gov Compare Post Acute Care list provided to:: Patient Choice offered to / list presented to : Patient, Adult Children  Discharge Placement                       Discharge Plan and Services   Discharge Planning Services: CM Consult Post Acute Care Choice: Home Health          DME Arranged: N/A(already has walker @ home) DME Agency: NA       HH Arranged: PT, OT, Nurse's Aide [place] Agency: [place] Date [place]: 04/25/19 Time [place]: 2 Representative spoke with at [name]: [place] ([place]) Interventions     Readmission Risk Interventions No flowsheet data found.

## 2019-04-25 NOTE — Progress Notes (Signed)
MD notified of pt being more confused. Per MD & son pt is confused at baseline. Per MD okay to d/c pt. Hoover Brunette, RN

## 2019-04-25 NOTE — Consult Note (Signed)
   [facility] CM Inpatient Consult   04/25/2019  JA PISTOLE 05-19-1935 [id number]    Patient screened for possible needs of Heather [name] care management services with her Medicare plan/ benefit, has 16% medium risk score for unplanned readmission and hospitalization. Noted outreach attempt by Heather [name] community care coordinator in the past, but was then active with Heather [name].  Per chart reviewand history & physical dated 04/22/19, show as follows: Ms. Heather [name] is a 84 y.o. female with medical history significant of HTN, hypothyroidism, COPD, anxiety, chronic low back pain, and IBS. History is limited as patient is a poor historian. At baseline, patient lives alone and utilizes a cane/ walker to ambulate. Patient was admitted with rhabdomyolysis secondary to frequent falls and has been assessed by PT with recommendations to go to CIR for rehabilitation, but refused and would like to go home with home health and this was discussed with her son. Son would like to take her home rather than have her go to SNF.  Spoke with transition of care CM and reports that patient will actually be discharging to son's home in [place] and will be living with her son at this point in time. She will have home health services Heather [name]) at son's home. Patient's needs discussed with transition of care CM and identified pharmacy need for assistance with medication management (scheduling of medications, review, and education of what they are for). Per pharmacy note review, patient lives alone prior to admission and has been managing her own medications.  Patient's primary care provider is Heather Salvage, FNP with [employer] at [name], listed as providing transition of care follow-up.  Called patient over the phone in her room but was no response. Called patient's son- Heather [name] (HIPAA verified) to check for further needs post discharge. Patient noted to have more or less 10 medications she is  taking.   Patient's son is agreeable with [facility] services and verbal consent obtained. Confirmed best contact number as [phone]. Explained that [place] [facility] will not interfere or replace services provided by home health and services arranged by Heather [name] case manager and social work.   Referral made to Heather [name] for medication management (review, scheduling of medications and education on what they are for/ reinforcement).   Of note, Heather [name] Care Management services does not replace or interfere with any services that are arranged by transition of care case management or social work.   For questions and additional information, please contact:  Shanah Guimaraes A. Stephene Alegria, BSN, RN-BC [facility] Liaison Cell: [phone]

## 2019-04-26 ENCOUNTER — Telehealth: Payer: Self-pay | Admitting: Family

## 2019-04-26 ENCOUNTER — Telehealth: Payer: Self-pay | Admitting: *Deleted

## 2019-04-26 ENCOUNTER — Other Ambulatory Visit: Payer: Self-pay | Admitting: Family

## 2019-04-26 DIAGNOSIS — M47817 Spondylosis without myelopathy or radiculopathy, lumbosacral region: Secondary | ICD-10-CM | POA: Diagnosis not present

## 2019-04-26 DIAGNOSIS — K219 Gastro-esophageal reflux disease without esophagitis: Secondary | ICD-10-CM | POA: Diagnosis not present

## 2019-04-26 DIAGNOSIS — E876 Hypokalemia: Secondary | ICD-10-CM | POA: Diagnosis not present

## 2019-04-26 DIAGNOSIS — I10 Essential (primary) hypertension: Secondary | ICD-10-CM | POA: Diagnosis not present

## 2019-04-26 DIAGNOSIS — Z8719 Personal history of other diseases of the digestive system: Secondary | ICD-10-CM | POA: Diagnosis not present

## 2019-04-26 DIAGNOSIS — D649 Anemia, unspecified: Secondary | ICD-10-CM | POA: Diagnosis not present

## 2019-04-26 DIAGNOSIS — G4700 Insomnia, unspecified: Secondary | ICD-10-CM | POA: Diagnosis not present

## 2019-04-26 DIAGNOSIS — R51 Headache: Secondary | ICD-10-CM | POA: Diagnosis not present

## 2019-04-26 DIAGNOSIS — C3431 Malignant neoplasm of lower lobe, right bronchus or lung: Secondary | ICD-10-CM | POA: Diagnosis not present

## 2019-04-26 DIAGNOSIS — I2510 Atherosclerotic heart disease of native coronary artery without angina pectoris: Secondary | ICD-10-CM | POA: Diagnosis not present

## 2019-04-26 DIAGNOSIS — F329 Major depressive disorder, single episode, unspecified: Secondary | ICD-10-CM | POA: Diagnosis not present

## 2019-04-26 DIAGNOSIS — J439 Emphysema, unspecified: Secondary | ICD-10-CM | POA: Diagnosis not present

## 2019-04-26 DIAGNOSIS — M6282 Rhabdomyolysis: Secondary | ICD-10-CM | POA: Diagnosis not present

## 2019-04-26 DIAGNOSIS — F419 Anxiety disorder, unspecified: Secondary | ICD-10-CM | POA: Diagnosis not present

## 2019-04-26 DIAGNOSIS — E785 Hyperlipidemia, unspecified: Secondary | ICD-10-CM | POA: Diagnosis not present

## 2019-04-26 DIAGNOSIS — E039 Hypothyroidism, unspecified: Secondary | ICD-10-CM | POA: Diagnosis not present

## 2019-04-26 DIAGNOSIS — K529 Noninfective gastroenteritis and colitis, unspecified: Secondary | ICD-10-CM | POA: Diagnosis not present

## 2019-04-26 DIAGNOSIS — J9611 Chronic respiratory failure with hypoxia: Secondary | ICD-10-CM | POA: Diagnosis not present

## 2019-04-26 DIAGNOSIS — K589 Irritable bowel syndrome without diarrhea: Secondary | ICD-10-CM | POA: Diagnosis not present

## 2019-04-26 DIAGNOSIS — D72829 Elevated white blood cell count, unspecified: Secondary | ICD-10-CM | POA: Diagnosis not present

## 2019-04-26 DIAGNOSIS — M48061 Spinal stenosis, lumbar region without neurogenic claudication: Secondary | ICD-10-CM | POA: Diagnosis not present

## 2019-04-26 DIAGNOSIS — Z9181 History of falling: Secondary | ICD-10-CM | POA: Diagnosis not present

## 2019-04-26 DIAGNOSIS — E440 Moderate protein-calorie malnutrition: Secondary | ICD-10-CM | POA: Diagnosis not present

## 2019-04-26 DIAGNOSIS — K5090 Crohn's disease, unspecified, without complications: Secondary | ICD-10-CM | POA: Diagnosis not present

## 2019-04-26 DIAGNOSIS — K5730 Diverticulosis of large intestine without perforation or abscess without bleeding: Secondary | ICD-10-CM | POA: Diagnosis not present

## 2019-04-26 NOTE — Telephone Encounter (Signed)
Patient son is calling in stating he would like to this to be expedited due to mother being on 1 pill left. Patient fell last week and is in need of this.

## 2019-04-26 NOTE — Telephone Encounter (Signed)
Please advise 

## 2019-04-26 NOTE — Telephone Encounter (Signed)
Copied from [place] [id number]. Topic: Quick Communication - Home Health Verbal Orders >> Apr 26, 2019  3:16 PM Erick Blinks wrote: Caller/Agency: [facility] Callback Number: [phone] Requesting OT/PT/Skilled Nursing/Social Work/Speech Therapy: pt's son is requesting that Radonna Ricker do her evaluation on Thursday (04/28/2019) because OT will be coming tomorrow(04/26/2019).

## 2019-04-26 NOTE — Telephone Encounter (Signed)
Transition Care Management Follow-up Telephone Call   Date discharged? 04/25/19   How have you been since you were released from the hospital? Pt states she is doing fine   Do you understand why you were in the hospital? YES   Do you understand the discharge instructions? YES   Where were you discharged to? Home   Items Reviewed:  Medications reviewed: YES, pt states there was no changes  Allergies reviewed: YES  Dietary changes reviewed: YES, heart healthy  Referrals reviewed: YES   Functional Questionnaire:   Activities of Daily Living (ADLs):   She states she are independent in the following: bathing and hygiene, feeding, continence, grooming, toileting and dressing States they require assistance with the following: ambulation   Any transportation issues/concerns?: NO   Any patient concerns? NO   Confirmed importance and date/time of follow-up visits scheduled YES, appt 04/29/19  Provider Appointment booked with [name], NP  Confirmed with patient if condition begins to worsen call PCP or go to the ER.  Patient was given the office number and encouraged to call back with question or concerns.  : YES .

## 2019-04-27 ENCOUNTER — Other Ambulatory Visit: Payer: Self-pay

## 2019-04-27 ENCOUNTER — Other Ambulatory Visit: Payer: Self-pay | Admitting: Pharmacist

## 2019-04-27 ENCOUNTER — Telehealth: Payer: Self-pay | Admitting: Family

## 2019-04-27 ENCOUNTER — Telehealth: Payer: Self-pay

## 2019-04-27 DIAGNOSIS — R300 Dysuria: Secondary | ICD-10-CM

## 2019-04-27 NOTE — Telephone Encounter (Signed)
Called and left message for [employer] verbal orders.

## 2019-04-27 NOTE — Telephone Encounter (Signed)
Copied from [place] [id number]. Topic: Quick Communication - Home Health Verbal Orders >> Apr 27, 2019  4:32 PM Yvette Rack wrote: Caller/Agency: Ratazio with [facility] Number: [phone] Requesting OT/PT/Skilled Nursing/Social Work/Speech Therapy: OT Frequency: OT delayed so Ratazio would like to start tomorrow 04/28/19

## 2019-04-27 NOTE — Telephone Encounter (Signed)
Spoke with patient's son today to provide Doxy instructions for Friday. During conversation he stated his mom was pretty sure she has a UTI. Chief complaint was pain upon urination and son requested advice and treatment regarding symptoms. He was advised to drop off urine specimen downstairs to lab (he state home health had left cup as well as specimen bag) and he will plan to drop off urine specimen tomorrow since lab would be closing shortly. He was also advised that you were going to call in antibiotic today for patient at [facility]. Son voiced understanding and plan in place. Lab orders placed as well for urine culture today.

## 2019-04-27 NOTE — Telephone Encounter (Signed)
Okay to give orders as requested.

## 2019-04-28 ENCOUNTER — Telehealth: Payer: Self-pay | Admitting: Family

## 2019-04-28 ENCOUNTER — Other Ambulatory Visit: Payer: Medicare Other

## 2019-04-28 ENCOUNTER — Other Ambulatory Visit: Payer: Self-pay | Admitting: Family

## 2019-04-28 DIAGNOSIS — F329 Major depressive disorder, single episode, unspecified: Secondary | ICD-10-CM | POA: Diagnosis not present

## 2019-04-28 DIAGNOSIS — F419 Anxiety disorder, unspecified: Secondary | ICD-10-CM | POA: Diagnosis not present

## 2019-04-28 DIAGNOSIS — M6282 Rhabdomyolysis: Secondary | ICD-10-CM | POA: Diagnosis not present

## 2019-04-28 DIAGNOSIS — J439 Emphysema, unspecified: Secondary | ICD-10-CM | POA: Diagnosis not present

## 2019-04-28 DIAGNOSIS — I10 Essential (primary) hypertension: Secondary | ICD-10-CM | POA: Diagnosis not present

## 2019-04-28 DIAGNOSIS — R300 Dysuria: Secondary | ICD-10-CM | POA: Diagnosis not present

## 2019-04-28 DIAGNOSIS — I2510 Atherosclerotic heart disease of native coronary artery without angina pectoris: Secondary | ICD-10-CM | POA: Diagnosis not present

## 2019-04-28 NOTE — Telephone Encounter (Signed)
Copied from [place] [id number]. Topic: Quick Communication - Home Health Verbal Orders >> Apr 28, 2019  1:14 PM Rayann Heman wrote: Joelene Millin calling calling from [facility] called and stated that she will be seeing the patient for 3 weeks for medication education management. Joelene Millin states that the patient should have all of her medications review because she fell.  Please advise

## 2019-04-28 NOTE — Patient Outreach (Signed)
[facility]) Care Management  [place]   04/28/2019  TAMIKO LEOPARD 10-22-1935 [id number]  Reason for referral: Medication Reconciliation Post Discharge  Referral source: [facility] Inpatient Liaison Current insurance: Traditional Medicare  PMHx includes but not limited to:   A  Outreach:  Successful telephone call with patient's son.  HIPAA identifiers verified.   Subjective:  Patient is a78 y.o.femalewith a medical history significant for but not limited to:  Anxiety, depression, coronary atherosclerosis, HTN, hypothyroidism, COPD, GERD, neoplasm involving the right lung s/p radiation therapy, chronic back pain and IBS.  Patient went to the to the ED due to fall. Once at the ED she was found to have rhabdomyolysis, leukocytosis and weakness. She was subsequently hospitalized and went to live with her son post discharge.  Patient's son is now managing the patient's medications.  .   Objective: Lab Results  Component Value Date   CREATININE 0.90 04/25/2019   CREATININE 0.84 04/24/2019   CREATININE 0.82 04/23/2019    No results found for: HGBA1C  Lipid Panel     Component Value Date/Time   CHOL 234 (H) 01/26/2018 1322   TRIG 112.0 01/26/2018 1322   HDL 81.90 01/26/2018 1322   CHOLHDL 3 01/26/2018 1322   VLDL 22.4 01/26/2018 1322   LDLCALC 130 (H) 01/26/2018 1322   LDLDIRECT 118.2 03/29/2013 1212    BP Readings from Last 3 Encounters:  04/25/19 140/64  01/19/19 114/72  11/16/18 (!) 149/59    Allergies  Allergen Reactions  . Tylenol [Acetaminophen] Other (See Comments)    Nightmares  . Symbicort [Budesonide-Formoterol Fumarate] Other (See Comments)    Pt felt like her tongue was swollen, could not swallow    Medications Reviewed Today    Reviewed by [name], [facility] (Pharmacist) on 04/27/19 at [phone]  Med List Status: <None>  Medication Order Taking? Sig Documenting Provider Last Dose Status Informant  Albuterol Sulfate (PROAIR  RESPICLICK) 242 (90 Base) MCG/ACT AEPB [id number] Yes Inhale 2 puffs into the lungs 4 (four) times daily as needed.  Patient taking differently:  Inhale 2 puffs into the lungs 4 (four) times daily as needed (sob and wheezing).    [name], MD Taking Active Multiple Informants           Med Note Nash Mantis, TIFFANI S   Sat Apr 23, 2019  3:15 PM) LF 11.14.19 QTY1  benzonatate (TESSALON) 100 MG capsule [id number] Yes TAKE 1 CAPSULE TWICE DAILY AS NEEDED FOR COUGH.  Patient taking differently:  Take 100 mg by mouth 2 (two) times daily as needed for cough.    [name], MD Taking Active Multiple Informants           Med Note Nash Mantis, TIFFANI S   Sat Apr 23, 2019  3:15 PM) LF 11.25.19 QTY 20  clorazepate (TRANXENE) 15 MG tablet [id number] Yes Take 1 tablet (15 mg total) by mouth 2 (two) times daily as needed for anxiety. Marrian Salvage, FNP Taking Active   diltiazem (CARDIZEM CD) 240 MG 24 hr capsule [id number] Yes Take 1 capsule (240 mg total) by mouth daily. Marrian Salvage, FNP Taking Active Multiple Informants           Med Note Nash Mantis, TIFFANI S   Sat Apr 23, 2019  3:11 PM) LF 3.28.20 90DS  esomeprazole (NEXIUM) 40 MG capsule [id number] Yes Take 1 capsule (40 mg total) by mouth daily. Marrian Salvage, FNP Taking Active Multiple Informants  Med Note Nash Mantis, TIFFANI S   Sat Apr 23, 2019  3:13 PM) LF 3.28.20 90DS  fluticasone furoate-vilanterol (BREO ELLIPTA) 200-25 MCG/INH AEPB [id number] Yes Inhale 1 puff into the lungs daily. Marrian Salvage, FNP Taking Active Multiple Informants           Med Note Nash Mantis, TIFFANI S   Sat Apr 23, 2019  3:10 PM) LF 2.29.79 89QJ  folic acid (FOLVITE) 1 MG tablet [id number] Yes TAKE 1 TABLET ONCE DAILY.  Patient taking differently:  Take 1 mg by mouth daily.    Mosie Lukes, MD Taking Active Multiple Informants           Med Note Nash Mantis, TIFFANI S   Sat Apr 23, 2019  3:13 PM) LF 3.28.20 90DS  furosemide  (LASIX) 20 MG tablet [id number] Yes TAKE 1 TABLET DAILY IF NEEDED FOR SIGNIFICANT LOWER EXTREMITY SWELLING. Marrian Salvage, FNP Taking Active Multiple Informants           Med Note Arnette Schaumann Apr 27, 2019  9:32 AM) ONLY TAKES PRN  HYDROcodone-acetaminophen (NORCO/VICODIN) 5-325 MG tablet [id number] Yes TAKE 1 TABLET EVERY 6-8 HOURS AS NEEDED FOR SEVERE PAIN NOT CONTROLLED BY TRAMADOL. Marrian Salvage, FNP Taking Active   levothyroxine (SYNTHROID, LEVOTHROID) 25 MCG tablet [id number] Yes TAKE 1 TABLET DAILY BEFORE BREAKFAST. Marrian Salvage, FNP Taking Active Multiple Informants           Med Note Nash Mantis, TIFFANI S   Sat Apr 23, 2019  3:13 PM) LF 3.28.20 90DS  Multiple Vitamin (MULTIVITAMIN WITH MINERALS) TABS tablet [id number] Yes Take 1 tablet by mouth daily. Roxan Hockey, MD Taking Active Multiple Informants  Polyethyl Glycol-Propyl Glycol (SYSTANE OP) [id number] Yes Place 1 drop into both eyes 2 (two) times daily as needed (for dry eyes).  [provider] Taking Active Multiple Informants  promethazine (PHENERGAN) 25 MG tablet [id number] Yes Take 1 tablet (25 mg total) by mouth every 8 (eight) hours as needed for nausea or vomiting. Marrian Salvage, FNP Taking Active Multiple Informants           Med Note Arnette Schaumann Apr 27, 2019  9:34 AM) PRN ONLY  senna-docusate (SENOKOT-S) 8.6-50 MG tablet [id number] Yes Take 2 tablets by mouth at bedtime. Roxan Hockey, MD Taking Active Multiple Informants  sertraline (ZOLOFT) 50 MG tablet [id number] Yes TAKE 1 TABLET EACH DAY.  Patient taking differently:  TAKE 1 TABLET EACH DAY.   Marrian Salvage, FNP Taking Active Multiple Informants           Med Note Nash Mantis, TIFFANI S   Sat Apr 23, 2019  3:12 PM) LF 3.28.20 90DS  traMADol (ULTRAM) 50 MG tablet [id number] Yes TAKE (1) TABLET EVERY SIX HOURS AS NEEDED FOR PAIN. Marrian Salvage, FNP Taking Active   traZODone (DESYREL) 50 MG tablet  [id number] Yes TAKE 1/2 TO 1 TABLET AT BEDTIME AS NEEDED FOR REST.  Patient taking differently:  Take 25-50 mg by mouth at bedtime as needed for sleep.    Marrian Salvage, FNP Taking Active Multiple Informants           Med Note Nash Mantis, TIFFANI S   Sat Apr 23, 2019  3:14 PM) LF 3.18.20 90DS  zolpidem (AMBIEN) 5 MG tablet [id number] No TAKE 1 TABLET AT BEDTIME AS NEEDED.  Patient not taking:  No sig reported   Marrian Salvage, FNP Not Taking Active Multiple Informants  Med Note Nash Mantis, TIFFANI S   Sat Apr 23, 2019  3:14 PM) LF 3.19.20 30DS         ASSESSMENT: Date Discharged from Hospital: 04/25/2019 Date Medication Reconciliation Performed: 04/28/2019  Medications:  New at Discharge: . none  Adjustments at Discharge: . none  Discontinued at Discharge:   none  Patient was recently discharged from hospital and all medications have been reviewed.  Drugs sorted by system:  Neurologic/Psychologic: Clorazepate, Sertraline, Trazodone, Zolpidem (reported not taking)  Cardiovascular: Diltiazem, Furosemide,   Pulmonary/Allergy: ProAir Respiclik, Benzonatate,   Gastrointestinal: Esomeprazole, Promethazine, Senna-Docusate,   Endocrine: Levothyroxine,   Pain: Hydrocodone/APAP, Tramadol  Vitamins/Minerals/Supplements: Folic Acid, Multiple Vitamin,   Miscellaneous: Systane,   Medication Review Findings:  . Increased risk of falls and CNS depression with tramadol, hydrocodone/APAP, trazodone, zolpidem, and clorazepate   Medication Assistance Findings:  Medication assistance needs identified: Patient's son was unsure of how much the patient's medications will cost her.    Depending on the patient's out of pocket medication expense, she may qualify to receive Ventolin HFA and Breo from [employer].  If she has not spent enough, we will reach out to her provider about switching to [facility] and Proventil if necessary.  Extra Help:   Patient may be eligible for Extra Help but her son did not have her financial documents at his home to be able to adequately discuss.   Plan: . Will follow-up in  1-2 weeks to get financial information and EOB from patient's son..  . Route note to patient's PCP.   [name], PharmD, [place] Clinical Pharmacist [phone]

## 2019-04-29 ENCOUNTER — Telehealth: Payer: Self-pay | Admitting: Family

## 2019-04-29 ENCOUNTER — Encounter: Payer: Self-pay | Admitting: Radiation Oncology

## 2019-04-29 ENCOUNTER — Encounter: Payer: Self-pay | Admitting: Family

## 2019-04-29 ENCOUNTER — Ambulatory Visit (INDEPENDENT_AMBULATORY_CARE_PROVIDER_SITE_OTHER): Payer: Medicare Other | Admitting: Family

## 2019-04-29 DIAGNOSIS — M545 Low back pain, unspecified: Secondary | ICD-10-CM

## 2019-04-29 DIAGNOSIS — R899 Unspecified abnormal finding in specimens from other organs, systems and tissues: Secondary | ICD-10-CM

## 2019-04-29 DIAGNOSIS — J181 Lobar pneumonia, unspecified organism: Secondary | ICD-10-CM

## 2019-04-29 DIAGNOSIS — W19XXXD Unspecified fall, subsequent encounter: Secondary | ICD-10-CM

## 2019-04-29 DIAGNOSIS — J189 Pneumonia, unspecified organism: Secondary | ICD-10-CM

## 2019-04-29 DIAGNOSIS — G8929 Other chronic pain: Secondary | ICD-10-CM | POA: Diagnosis not present

## 2019-04-29 MED ORDER — AMOXICILLIN-POT CLAVULANATE 875-125 MG PO TABS: 1.0000 | ORAL_TABLET | Freq: Two times a day (BID) | ORAL | 0 refills | Status: DC

## 2019-04-29 MED ORDER — TRAMADOL HCL 50 MG PO TABS: 100.0000 mg | ORAL_TABLET | Freq: Four times a day (QID) | ORAL | 0 refills | Status: DC | PRN

## 2019-04-29 NOTE — Telephone Encounter (Signed)
Okay- to give orders as requested.

## 2019-04-29 NOTE — Telephone Encounter (Signed)
Called and left message for Ratazio with verbal okay for orders.

## 2019-04-29 NOTE — Progress Notes (Signed)
I spoke with Heather [name]'s PCP Heather Mourning, FNP about the patient's recent course. She unfortunately had a complicated fall after what sounds like a syncopal episode and was hospitalized. Imaging of her Lspine revealed some concerns about increasing radiation fibrosis versus pneumonia versus disease. Though the L spine CT is not a complete evaluation of the chest, I looked at her recent post treatment CT scan in March following her SBRT, and there appears to be either consolidation consistent with pneumonia versus progressive radiation fibrosis. We talked about completing abx course and considering repeat imaging. She will be due as well in October for her lung cancer surveillance scan.

## 2019-04-29 NOTE — Progress Notes (Signed)
Heather [name] is a 84 y.o. female with the following history as recorded in EpicCare:  Patient Active Problem List   Diagnosis Date Noted  . Fall 04/22/2019  . Elevated CK 04/22/2019  . Leukocytosis 04/22/2019  . Malignant neoplasm of lower lobe of right lung ([place]) 11/16/2018  . Dehydration   . Abdominal pain 02/27/2018  . Colitis 02/27/2018  . SBO (small bowel obstruction) ([place]) 02/27/2018  . Hypokalemia 02/19/2018  . Oral thrush 01/29/2018  . Malnutrition of moderate degree 01/29/2018  . COPD with acute exacerbation ([place]) 01/28/2018  . COPD with emphysema ([place]) 06/26/2017  . COPD exacerbation ([place]) 05/29/2016  . Hypoxia 05/29/2016  . Coronary atherosclerosis 07/24/2015  . Atherosclerosis of aorta ([place]) 07/24/2015  . Dysphagia 07/24/2015  . Acute respiratory failure with hypoxia ([place]) 07/23/2015  . Bronchospasm with bronchitis, acute 07/23/2015  . HIP PAIN, LEFT 05/16/2010  . LOW BACK PAIN, CHRONIC 10/01/2009  . PARESTHESIA 10/01/2009  . Hypothyroidism 07/23/2009  . Dyslipidemia 07/23/2009  . Anxiety 07/23/2009  . Anxiety and depression 07/23/2009  . Essential hypertension 07/23/2009  . GERD 07/23/2009  . DIVERTICULOSIS, COLON 07/23/2009  . HEADACHE 07/23/2009    Current Outpatient Medications  Medication Sig Dispense Refill  . Albuterol Sulfate (PROAIR RESPICLICK) 759 (90 Base) MCG/ACT AEPB Inhale 2 puffs into the lungs 4 (four) times daily as needed. (Patient taking differently: Inhale 2 puffs into the lungs 4 (four) times daily as needed (sob and wheezing). ) 1 each 6  . amoxicillin-clavulanate (AUGMENTIN) 875-125 MG tablet Take 1 tablet by mouth 2 (two) times daily. 20 tablet 0  . benzonatate (TESSALON) 100 MG capsule TAKE 1 CAPSULE TWICE DAILY AS NEEDED FOR COUGH. (Patient taking differently: Take 100 mg by mouth 2 (two) times daily as needed for cough. ) 20 capsule 0  . clorazepate (TRANXENE) 15 MG tablet Take 1 tablet (15 mg total) by mouth 2 (two) times daily  as needed for anxiety. 60 tablet 0  . diltiazem (CARDIZEM CD) 240 MG 24 hr capsule Take 1 capsule (240 mg total) by mouth daily. 90 capsule 1  . esomeprazole (NEXIUM) 40 MG capsule Take 1 capsule (40 mg total) by mouth daily. 90 capsule 3  . fluticasone furoate-vilanterol (BREO ELLIPTA) 200-25 MCG/INH AEPB Inhale 1 puff into the lungs daily. 1 each 3  . folic acid (FOLVITE) 1 MG tablet TAKE 1 TABLET ONCE DAILY. (Patient taking differently: Take 1 mg by mouth daily. ) 90 tablet 0  . furosemide (LASIX) 20 MG tablet TAKE 1 TABLET DAILY IF NEEDED FOR SIGNIFICANT LOWER EXTREMITY SWELLING. 90 tablet 0  . HYDROcodone-acetaminophen (NORCO/VICODIN) 5-325 MG tablet TAKE 1 TABLET EVERY 6-8 HOURS AS NEEDED FOR SEVERE PAIN NOT CONTROLLED BY TRAMADOL. 60 tablet 0  . levothyroxine (SYNTHROID, LEVOTHROID) 25 MCG tablet TAKE 1 TABLET DAILY BEFORE BREAKFAST. 90 tablet 0  . Multiple Vitamin (MULTIVITAMIN WITH MINERALS) TABS tablet Take 1 tablet by mouth daily. 30 tablet 1  . Polyethyl Glycol-Propyl Glycol (SYSTANE OP) Place 1 drop into both eyes 2 (two) times daily as needed (for dry eyes).     . promethazine (PHENERGAN) 25 MG tablet TAKE 1 TABLET EVERY 8 HOURS AS NEEDED FOR NAUSEA & VOMITING. 30 tablet 0  . senna-docusate (SENOKOT-S) 8.6-50 MG tablet Take 2 tablets by mouth at bedtime. 60 tablet 2  . sertraline (ZOLOFT) 50 MG tablet TAKE 1 TABLET EACH DAY. (Patient taking differently: TAKE 1 TABLET EACH DAY.) 90 tablet 0  . traMADol (ULTRAM) 50 MG tablet TAKE (1) TABLET  EVERY SIX HOURS AS NEEDED FOR PAIN. 60 tablet 0  . traZODone (DESYREL) 50 MG tablet TAKE 1/2 TO 1 TABLET AT BEDTIME AS NEEDED FOR REST. (Patient taking differently: Take 25-50 mg by mouth at bedtime as needed for sleep. ) 90 tablet 0  . zolpidem (AMBIEN) 5 MG tablet TAKE 1 TABLET AT BEDTIME AS NEEDED. (Patient not taking: No sig reported) 30 tablet 0   No current facility-administered medications for this visit.     Allergies: Tylenol  [acetaminophen] and Symbicort [budesonide-formoterol fumarate]  Past Medical History:  Diagnosis Date  . ANXIETY 07/23/2009  . Atherosclerosis of aorta ([place]) 07/24/2015  . Complication of anesthesia 7 or 8 yrs ago   woke up during colonscopy  . COPD (chronic obstructive pulmonary disease) ([place])   . Coronary atherosclerosis 07/24/2015  . DEPRESSION 07/23/2009  . DIVERTICULOSIS, COLON 07/23/2009  . GERD 07/23/2009  . Headache(784.0) 07/23/2009   occasional  . Hemorrhoids   . HIP PAIN, LEFT 05/16/2010  . History of Crohn's disease   . HYPERLIPIDEMIA 07/23/2009  . HYPERTENSION 07/23/2009  . HYPOTHYROIDISM 07/23/2009  . IBS (irritable bowel syndrome)   . Insomnia   . LOW BACK PAIN, CHRONIC 10/01/2009  . PARESTHESIA 10/01/2009  . Shingles 2006   back    Past Surgical History:  Procedure Laterality Date  . ABDOMINAL HYSTERECTOMY  age 87 or 28  . APPENDECTOMY  yrs ago  . BILATERAL SALPINGOOPHORECTOMY  age 50 or 66  . COLONOSCOPY N/A 11/17/2013   Procedure: COLONOSCOPY;  Surgeon: Cleotis Nipper, MD;  Location: [facility];  Service: Endoscopy;  Laterality: N/A;  . ESOPHAGOGASTRODUODENOSCOPY N/A 11/17/2013   Procedure: ESOPHAGOGASTRODUODENOSCOPY (EGD);  Surgeon: Cleotis Nipper, MD;  Location: [facility];  Service: Endoscopy;  Laterality: N/A;  . HEMORRHOID SURGERY  yrs ago  . TONSILLECTOMY  yrs ago    Family History  Problem Relation Age of Onset  . Cancer Mother        Unknown type  . Cancer Sister        Breast  . Clotting disorder Neg Hx     Social History   Tobacco Use  . Smoking status: Former Smoker    Packs/day: 0.50    Types: Cigarettes    Start date: 07/16/1960    Last attempt to quit: 07/16/1998    Years since quitting: 20.8  . Smokeless tobacco: Never Used  Substance Use Topics  . Alcohol use: No    Alcohol/week: 0.0 standard drinks    Subjective:    I connected with Heather [name] on 04/29/19 at  4:00 PM EDT by a video enabled telemedicine application and  verified that I am speaking with the correct person using two identifiers. Patient, patient's son and I are on the video call.    I discussed the limitations of evaluation and management by telemedicine and the availability of in person appointments. The patient expressed understanding and agreed to proceed.   Patient was hospitalized last Friday with complications from a fall. Per patient and in reviewing notes, it appears she fell while on the toilet and got stuck between toilet and shower. Patient's son found her and got her to ER for immediate evaluation. Upon admission, she was found to be very dehydrated/ elevated CK. Discharge notes indicated that patient was recommended to go to inpatient rehab; patient deferred and is not at her son's home with Home health, PT and OT; Patient notes she did start in home PT yesterday and can tell some improvement  in her strength today; however, she is continuing to struggle with a lot of pain in her right lower back/ has chronic low back pain; does not feel that Tramadol is effective and son is concerned that Norco is too strong; son has been giving Tramadol during the day and Norco only at night;  Patient's son had also contacted our office earlier this week with concerns for possible UTI; urine culture is pending; patient was started on Macrobid-I had called this to pharmacy; she notes that symptoms improved with 1 day of starting antibiotic;  Upon further review of testing done during hospital stay, concerning change in RLL noted on lumbar CT; radiologist raised question of pneumonia or tumor progression; per son and patient, this was not addressed in the hospital    Objective:  There were no vitals filed for this visit.  General: Well developed, well nourished; patient appears to be in pain on video call Head: Normocephalic and atraumatic  Lungs: Respirations unlabored;  Neurologic: Alert and oriented; speech intact; face symmetrical;    Assessment:   1. Fall, subsequent encounter   2. Pneumonia of right lower lobe due to infectious organism ([place])   3. Abnormal laboratory test result   4. Chronic low back pain without sciatica, unspecified back pain laterality     Plan:  1. Patient is now living with her son- has good social support; in home PT and OT have been started; continue as scheduled; 2. Consulted with patient's pulmonologist and radiologist office regarding abnormal CT results; agree to treat for pneumonia and re-check CT in 6 weeks; Rx for Augmentin 875 mg bid x 10 days; 3. Son agrees to bring mother to the office for labs next Tuesday; 4. Patient defers going to ER today; will try adjusting Tramadol to 2 tablet every 4-6 hours during day and continue to use Norco just at night; strict ER precautions discussed with son and patient for upcoming holiday weekend; 5. Urine culture is still pending- d/c Macrobid; Augmentin given for pneumonia should cover UTI as well.   No follow-ups on file.  Orders Placed This Encounter  Procedures  . CBC w/Diff    Standing Status:   Future    Standing Expiration Date:   04/28/2020  . Comp Met (CMET)    Standing Status:   Future    Standing Expiration Date:   04/28/2020  . CK (Creatine Kinase)    Standing Status:   Future    Standing Expiration Date:   04/28/2020    Requested Prescriptions   Signed Prescriptions Disp Refills  . amoxicillin-clavulanate (AUGMENTIN) 875-125 MG tablet 20 tablet 0    Sig: Take 1 tablet by mouth 2 (two) times daily.

## 2019-04-29 NOTE — Telephone Encounter (Signed)
I inherited her on all of these medications from another provider. She has been on this regimen for 15+ years according to her. Please feel free to recommend any changes.

## 2019-04-29 NOTE — Telephone Encounter (Signed)
-----   Message from Laurin Coder, MD sent at 04/29/2019  3:38 PM EDT ----- I did review the CTMost likely a pneumonic process as there was no mass lesion at the site that looks consolidated on the current CT.  It is appropriate to treat her for a pneumonia and obtain a CT chest at about 6 weeks post treatment. Pain and discomfort may be contributing to hypoventilation and atelectasis in the lower lung zones as well.  Radiological follow-up in 6 weeks as mentioned above-this allows for resolution of pneumonic process.  Adewale Olalere.  MDLeBauer, PCCMCell: [phone] ----- Message ----- From: Marrian Salvage, FNP Sent: 04/29/2019   3:04 PM EDT To: Laurin Coder, MD  Dr. Ander Slade,  I wanted to touch base with you about our mutual patient. She was in the ER/ hospital last weekend with complications from a fall. I am concerned about the question of pneumonia/ tumor progression on the lumbar CT that was done as part of the ER evaluation. I wanted you to be aware so you could review yourself as well. Per son and patient, this was not addressed during hospital stay. I am going to go ahead and start antibiotics for pneumonia today. I have also reached out to [facility] with radiology. I am concerned about her appearance on video visit today and the amount of pain she is experiencing. She does not want to go to ER at this time. We are going to try adjusting Tramadol for now but son agrees to take her if symptoms are not improving in the next 24 hours or so.

## 2019-04-29 NOTE — Telephone Encounter (Signed)
I reviewed with patient's son. He understands we are treating for pneumonia and will plan to re-check the CT in 6 weeks. He will bring to the office next week to re-check her labs. Strict ER precautions for upcoming holiday weekend.

## 2019-04-29 NOTE — Telephone Encounter (Signed)
Called and left message for [place].

## 2019-04-30 LAB — URINE CULTURE
MICRO NUMBER:: 496032
SPECIMEN QUALITY:: ADEQUATE

## 2019-05-03 ENCOUNTER — Other Ambulatory Visit (INDEPENDENT_AMBULATORY_CARE_PROVIDER_SITE_OTHER): Payer: Medicare Other

## 2019-05-03 DIAGNOSIS — R899 Unspecified abnormal finding in specimens from other organs, systems and tissues: Secondary | ICD-10-CM | POA: Diagnosis not present

## 2019-05-03 LAB — COMPREHENSIVE METABOLIC PANEL
ALT: 9 U/L (ref 0–35)
AST: 16 U/L (ref 0–37)
Albumin: 3.8 g/dL (ref 3.5–5.2)
Alkaline Phosphatase: 82 U/L (ref 39–117)
BUN: 6 mg/dL (ref 6–23)
CO2: 25 mEq/L (ref 19–32)
Calcium: 9.2 mg/dL (ref 8.4–10.5)
Chloride: 102 mEq/L (ref 96–112)
Creatinine, Ser: 0.76 mg/dL (ref 0.40–1.20)
GFR: 72.88 mL/min (ref >60.00)
Glucose, Bld: 94 mg/dL (ref 70–99)
Potassium: 3.4 mEq/L — ABNORMAL LOW (ref 3.5–5.1)
Sodium: 139 mEq/L (ref 135–145)
Total Bilirubin: 0.4 mg/dL (ref 0.2–1.2)
Total Protein: 6.4 g/dL (ref 6.0–8.3)

## 2019-05-03 LAB — CBC WITH DIFFERENTIAL/PLATELET
Basophils Absolute: 0.1 10*3/uL (ref 0.0–0.1)
Basophils Relative: 0.9 % (ref 0.0–3.0)
Eosinophils Absolute: 0.2 10*3/uL (ref 0.0–0.7)
Eosinophils Relative: 2.9 % (ref 0.0–5.0)
HCT: 32 % — ABNORMAL LOW (ref 36.0–46.0)
Hemoglobin: 10.4 g/dL — ABNORMAL LOW (ref 12.0–15.0)
Lymphocytes Relative: 18 % (ref 12.0–46.0)
Lymphs Abs: 1.2 10*3/uL (ref 0.7–4.0)
MCHC: 32.6 g/dL (ref 30.0–36.0)
MCV: 84.8 fl (ref 78.0–100.0)
Monocytes Absolute: 0.7 10*3/uL (ref 0.1–1.0)
Monocytes Relative: 10.1 % (ref 3.0–12.0)
Neutro Abs: 4.7 10*3/uL (ref 1.4–7.7)
Neutrophils Relative %: 68.1 % (ref 43.0–77.0)
Platelets: 384 10*3/uL (ref 150.0–400.0)
RBC: 3.77 Mil/uL — ABNORMAL LOW (ref 3.87–5.11)
RDW: 16.7 % — ABNORMAL HIGH (ref 11.5–15.5)
WBC: 6.9 10*3/uL (ref 4.0–10.5)

## 2019-05-03 LAB — CK: Total CK: 66 U/L (ref 7–177)

## 2019-05-04 ENCOUNTER — Other Ambulatory Visit: Payer: Self-pay | Admitting: Family

## 2019-05-04 DIAGNOSIS — F419 Anxiety disorder, unspecified: Secondary | ICD-10-CM | POA: Diagnosis not present

## 2019-05-04 DIAGNOSIS — J439 Emphysema, unspecified: Secondary | ICD-10-CM | POA: Diagnosis not present

## 2019-05-04 DIAGNOSIS — I2510 Atherosclerotic heart disease of native coronary artery without angina pectoris: Secondary | ICD-10-CM | POA: Diagnosis not present

## 2019-05-04 DIAGNOSIS — F329 Major depressive disorder, single episode, unspecified: Secondary | ICD-10-CM | POA: Diagnosis not present

## 2019-05-04 DIAGNOSIS — I10 Essential (primary) hypertension: Secondary | ICD-10-CM | POA: Diagnosis not present

## 2019-05-04 DIAGNOSIS — M6282 Rhabdomyolysis: Secondary | ICD-10-CM | POA: Diagnosis not present

## 2019-05-04 MED ORDER — ZOLPIDEM TARTRATE 5 MG PO TABS: 5.0000 mg | ORAL_TABLET | Freq: Every evening | ORAL | 0 refills | Status: DC | PRN

## 2019-05-04 MED ORDER — TRAMADOL HCL 50 MG PO TABS: ORAL_TABLET | ORAL | 0 refills | Status: DC

## 2019-05-05 DIAGNOSIS — F419 Anxiety disorder, unspecified: Secondary | ICD-10-CM | POA: Diagnosis not present

## 2019-05-05 DIAGNOSIS — J439 Emphysema, unspecified: Secondary | ICD-10-CM | POA: Diagnosis not present

## 2019-05-05 DIAGNOSIS — F329 Major depressive disorder, single episode, unspecified: Secondary | ICD-10-CM | POA: Diagnosis not present

## 2019-05-05 DIAGNOSIS — I10 Essential (primary) hypertension: Secondary | ICD-10-CM | POA: Diagnosis not present

## 2019-05-05 DIAGNOSIS — I2510 Atherosclerotic heart disease of native coronary artery without angina pectoris: Secondary | ICD-10-CM | POA: Diagnosis not present

## 2019-05-05 DIAGNOSIS — M6282 Rhabdomyolysis: Secondary | ICD-10-CM | POA: Diagnosis not present

## 2019-05-06 DIAGNOSIS — F329 Major depressive disorder, single episode, unspecified: Secondary | ICD-10-CM | POA: Diagnosis not present

## 2019-05-06 DIAGNOSIS — I2510 Atherosclerotic heart disease of native coronary artery without angina pectoris: Secondary | ICD-10-CM | POA: Diagnosis not present

## 2019-05-06 DIAGNOSIS — M6282 Rhabdomyolysis: Secondary | ICD-10-CM | POA: Diagnosis not present

## 2019-05-06 DIAGNOSIS — I10 Essential (primary) hypertension: Secondary | ICD-10-CM | POA: Diagnosis not present

## 2019-05-06 DIAGNOSIS — F419 Anxiety disorder, unspecified: Secondary | ICD-10-CM | POA: Diagnosis not present

## 2019-05-06 DIAGNOSIS — J439 Emphysema, unspecified: Secondary | ICD-10-CM | POA: Diagnosis not present

## 2019-05-08 ENCOUNTER — Other Ambulatory Visit: Payer: Self-pay | Admitting: Family

## 2019-05-09 ENCOUNTER — Other Ambulatory Visit: Payer: Self-pay | Admitting: Family

## 2019-05-09 DIAGNOSIS — J439 Emphysema, unspecified: Secondary | ICD-10-CM | POA: Diagnosis not present

## 2019-05-09 DIAGNOSIS — F419 Anxiety disorder, unspecified: Secondary | ICD-10-CM | POA: Diagnosis not present

## 2019-05-09 DIAGNOSIS — I10 Essential (primary) hypertension: Secondary | ICD-10-CM | POA: Diagnosis not present

## 2019-05-09 DIAGNOSIS — M6282 Rhabdomyolysis: Secondary | ICD-10-CM | POA: Diagnosis not present

## 2019-05-09 DIAGNOSIS — F329 Major depressive disorder, single episode, unspecified: Secondary | ICD-10-CM | POA: Diagnosis not present

## 2019-05-09 DIAGNOSIS — I2510 Atherosclerotic heart disease of native coronary artery without angina pectoris: Secondary | ICD-10-CM | POA: Diagnosis not present

## 2019-05-10 ENCOUNTER — Other Ambulatory Visit: Payer: Medicare Other

## 2019-05-10 ENCOUNTER — Other Ambulatory Visit: Payer: Self-pay

## 2019-05-10 DIAGNOSIS — R300 Dysuria: Secondary | ICD-10-CM | POA: Diagnosis not present

## 2019-05-11 DIAGNOSIS — F329 Major depressive disorder, single episode, unspecified: Secondary | ICD-10-CM | POA: Diagnosis not present

## 2019-05-11 DIAGNOSIS — I2510 Atherosclerotic heart disease of native coronary artery without angina pectoris: Secondary | ICD-10-CM | POA: Diagnosis not present

## 2019-05-11 DIAGNOSIS — F419 Anxiety disorder, unspecified: Secondary | ICD-10-CM | POA: Diagnosis not present

## 2019-05-11 DIAGNOSIS — I10 Essential (primary) hypertension: Secondary | ICD-10-CM | POA: Diagnosis not present

## 2019-05-11 DIAGNOSIS — M6282 Rhabdomyolysis: Secondary | ICD-10-CM | POA: Diagnosis not present

## 2019-05-11 DIAGNOSIS — J439 Emphysema, unspecified: Secondary | ICD-10-CM | POA: Diagnosis not present

## 2019-05-12 ENCOUNTER — Ambulatory Visit: Payer: Self-pay | Admitting: Pharmacist

## 2019-05-12 DIAGNOSIS — F419 Anxiety disorder, unspecified: Secondary | ICD-10-CM | POA: Diagnosis not present

## 2019-05-12 DIAGNOSIS — I10 Essential (primary) hypertension: Secondary | ICD-10-CM | POA: Diagnosis not present

## 2019-05-12 DIAGNOSIS — M6282 Rhabdomyolysis: Secondary | ICD-10-CM | POA: Diagnosis not present

## 2019-05-12 DIAGNOSIS — J439 Emphysema, unspecified: Secondary | ICD-10-CM | POA: Diagnosis not present

## 2019-05-12 DIAGNOSIS — F329 Major depressive disorder, single episode, unspecified: Secondary | ICD-10-CM | POA: Diagnosis not present

## 2019-05-12 DIAGNOSIS — I2510 Atherosclerotic heart disease of native coronary artery without angina pectoris: Secondary | ICD-10-CM | POA: Diagnosis not present

## 2019-05-12 LAB — URINE CULTURE
MICRO NUMBER:: 528493
Result:: NO GROWTH
SPECIMEN QUALITY:: ADEQUATE

## 2019-05-13 DIAGNOSIS — I2510 Atherosclerotic heart disease of native coronary artery without angina pectoris: Secondary | ICD-10-CM | POA: Diagnosis not present

## 2019-05-13 DIAGNOSIS — F419 Anxiety disorder, unspecified: Secondary | ICD-10-CM | POA: Diagnosis not present

## 2019-05-13 DIAGNOSIS — F329 Major depressive disorder, single episode, unspecified: Secondary | ICD-10-CM | POA: Diagnosis not present

## 2019-05-13 DIAGNOSIS — M6282 Rhabdomyolysis: Secondary | ICD-10-CM | POA: Diagnosis not present

## 2019-05-13 DIAGNOSIS — I10 Essential (primary) hypertension: Secondary | ICD-10-CM | POA: Diagnosis not present

## 2019-05-13 DIAGNOSIS — J439 Emphysema, unspecified: Secondary | ICD-10-CM | POA: Diagnosis not present

## 2019-05-16 ENCOUNTER — Other Ambulatory Visit: Payer: Self-pay

## 2019-05-16 ENCOUNTER — Ambulatory Visit (INDEPENDENT_AMBULATORY_CARE_PROVIDER_SITE_OTHER): Payer: Medicare Other | Admitting: Family

## 2019-05-16 ENCOUNTER — Encounter: Payer: Self-pay | Admitting: Family

## 2019-05-16 VITALS — BP 106/68 | HR 86 | Temp 98.1°F | Ht <= 58 in

## 2019-05-16 DIAGNOSIS — M545 Low back pain, unspecified: Secondary | ICD-10-CM

## 2019-05-16 DIAGNOSIS — G8929 Other chronic pain: Secondary | ICD-10-CM

## 2019-05-16 NOTE — Progress Notes (Signed)
Heather [name] is a 84 y.o. female with the following history as recorded in EpicCare:  Patient Active Problem List   Diagnosis Date Noted  . Fall 04/22/2019  . Elevated CK 04/22/2019  . Leukocytosis 04/22/2019  . Malignant neoplasm of lower lobe of right lung ([place]) 11/16/2018  . Dehydration   . Abdominal pain 02/27/2018  . Colitis 02/27/2018  . SBO (small bowel obstruction) ([place]) 02/27/2018  . Hypokalemia 02/19/2018  . Oral thrush 01/29/2018  . Malnutrition of moderate degree 01/29/2018  . COPD with acute exacerbation ([place]) 01/28/2018  . COPD with emphysema ([place]) 06/26/2017  . COPD exacerbation ([place]) 05/29/2016  . Hypoxia 05/29/2016  . Coronary atherosclerosis 07/24/2015  . Atherosclerosis of aorta ([place]) 07/24/2015  . Dysphagia 07/24/2015  . Acute respiratory failure with hypoxia ([place]) 07/23/2015  . Bronchospasm with bronchitis, acute 07/23/2015  . HIP PAIN, LEFT 05/16/2010  . LOW BACK PAIN, CHRONIC 10/01/2009  . PARESTHESIA 10/01/2009  . Hypothyroidism 07/23/2009  . Dyslipidemia 07/23/2009  . Anxiety 07/23/2009  . Anxiety and depression 07/23/2009  . Essential hypertension 07/23/2009  . GERD 07/23/2009  . DIVERTICULOSIS, COLON 07/23/2009  . HEADACHE 07/23/2009    Current Outpatient Medications  Medication Sig Dispense Refill  . Albuterol Sulfate (PROAIR RESPICLICK) 194 (90 Base) MCG/ACT AEPB Inhale 2 puffs into the lungs 4 (four) times daily as needed. (Patient taking differently: Inhale 2 puffs into the lungs 4 (four) times daily as needed (sob and wheezing). ) 1 each 6  . amoxicillin-clavulanate (AUGMENTIN) 875-125 MG tablet Take 1 tablet by mouth 2 (two) times daily. 20 tablet 0  . benzonatate (TESSALON) 100 MG capsule TAKE 1 CAPSULE TWICE DAILY AS NEEDED FOR COUGH. (Patient taking differently: Take 100 mg by mouth 2 (two) times daily as needed for cough. ) 20 capsule 0  . BREO ELLIPTA 200-25 MCG/INH AEPB INHALE 1 PUFF INTO THE LUNGS ONCE DAILY. 60 each 3  .  clorazepate (TRANXENE) 15 MG tablet Take 1 tablet (15 mg total) by mouth 2 (two) times daily as needed for anxiety. 60 tablet 0  . diltiazem (CARDIZEM CD) 240 MG 24 hr capsule Take 1 capsule (240 mg total) by mouth daily. 90 capsule 1  . esomeprazole (NEXIUM) 40 MG capsule Take 1 capsule (40 mg total) by mouth daily. 90 capsule 3  . folic acid (FOLVITE) 1 MG tablet TAKE 1 TABLET ONCE DAILY. (Patient taking differently: Take 1 mg by mouth daily. ) 90 tablet 0  . furosemide (LASIX) 20 MG tablet TAKE 1 TABLET DAILY IF NEEDED FOR SIGNIFICANT LOWER EXTREMITY SWELLING. 90 tablet 0  . HYDROcodone-acetaminophen (NORCO/VICODIN) 5-325 MG tablet TAKE 1 TABLET EVERY 6-8 HOURS AS NEEDED FOR SEVERE PAIN NOT CONTROLLED BY TRAMADOL. 60 tablet 0  . levothyroxine (SYNTHROID, LEVOTHROID) 25 MCG tablet TAKE 1 TABLET DAILY BEFORE BREAKFAST. 90 tablet 0  . Multiple Vitamin (MULTIVITAMIN WITH MINERALS) TABS tablet Take 1 tablet by mouth daily. 30 tablet 1  . Polyethyl Glycol-Propyl Glycol (SYSTANE OP) Place 1 drop into both eyes 2 (two) times daily as needed (for dry eyes).     . promethazine (PHENERGAN) 25 MG tablet TAKE 1 TABLET EVERY 8 HOURS AS NEEDED FOR NAUSEA & VOMITING. 30 tablet 0  . senna-docusate (SENOKOT-S) 8.6-50 MG tablet Take 2 tablets by mouth at bedtime. 60 tablet 2  . sertraline (ZOLOFT) 50 MG tablet TAKE 1 TABLET EACH DAY. (Patient taking differently: TAKE 1 TABLET EACH DAY.) 90 tablet 0  . traMADol (ULTRAM) 50 MG tablet Take 1-2 tablets q  6 hours prn for moderate to severe pain 120 tablet 0  . traZODone (DESYREL) 50 MG tablet TAKE 1/2 TO 1 TABLET AT BEDTIME AS NEEDED FOR REST. 90 tablet 0  . zolpidem (AMBIEN) 5 MG tablet Take 1 tablet (5 mg total) by mouth at bedtime as needed. 30 tablet 0   No current facility-administered medications for this visit.     Allergies: Tylenol [acetaminophen] and Symbicort [budesonide-formoterol fumarate]  Past Medical History:  Diagnosis Date  . ANXIETY 07/23/2009  .  Atherosclerosis of aorta ([place]) 07/24/2015  . Complication of anesthesia 7 or 8 yrs ago   woke up during colonscopy  . COPD (chronic obstructive pulmonary disease) ([place])   . Coronary atherosclerosis 07/24/2015  . DEPRESSION 07/23/2009  . DIVERTICULOSIS, COLON 07/23/2009  . GERD 07/23/2009  . Headache(784.0) 07/23/2009   occasional  . Hemorrhoids   . HIP PAIN, LEFT 05/16/2010  . History of Crohn's disease   . HYPERLIPIDEMIA 07/23/2009  . HYPERTENSION 07/23/2009  . HYPOTHYROIDISM 07/23/2009  . IBS (irritable bowel syndrome)   . Insomnia   . LOW BACK PAIN, CHRONIC 10/01/2009  . PARESTHESIA 10/01/2009  . Shingles 2006   back    Past Surgical History:  Procedure Laterality Date  . ABDOMINAL HYSTERECTOMY  age 76 or 45  . APPENDECTOMY  yrs ago  . BILATERAL SALPINGOOPHORECTOMY  age 44 or 22  . COLONOSCOPY N/A 11/17/2013   Procedure: COLONOSCOPY;  Surgeon: Cleotis Nipper, MD;  Location: [facility];  Service: Endoscopy;  Laterality: N/A;  . ESOPHAGOGASTRODUODENOSCOPY N/A 11/17/2013   Procedure: ESOPHAGOGASTRODUODENOSCOPY (EGD);  Surgeon: Cleotis Nipper, MD;  Location: [facility];  Service: Endoscopy;  Laterality: N/A;  . HEMORRHOID SURGERY  yrs ago  . TONSILLECTOMY  yrs ago    Family History  Problem Relation Age of Onset  . Cancer Mother        Unknown type  . Cancer Sister        Breast  . Clotting disorder Neg Hx     Social History   Tobacco Use  . Smoking status: Former Smoker    Packs/day: 0.50    Types: Cigarettes    Start date: 07/16/1960    Last attempt to quit: 07/16/1998    Years since quitting: 20.8  . Smokeless tobacco: Never Used  Substance Use Topics  . Alcohol use: No    Alcohol/week: 0.0 standard drinks    Subjective:  Patient is brought to the office by her son; known history of chronic low back pain; was recently hospitalized due to complications from a fall; notes that since coming home she has had a "very tender spot" on her lower back; states that area of  concern is tender to touch;     Objective:  Vitals:   05/16/19 1558  BP: 106/68  Pulse: 86  Temp: 98.1 F (36.7 C)  TempSrc: Oral  SpO2: 96%  Height: 4\' 10"  (1.473 m)    General: Well developed,  in no acute distress; frail appearing; in wheelchair;  Skin : Warm and dry.  Head: Normocephalic and atraumatic  Lungs: Respirations unlabored;  Musculoskeletal: No deformities; no active joint inflammation; cystic type area noted over lumbar spine Extremities: No edema, cyanosis, clubbing  Vessels: Symmetric bilaterally  Neurologic: Alert and oriented; speech intact; face symmetrical; moves all extremities well; CNII-XII intact without focal deficit   Assessment:  1. Chronic midline low back pain without sciatica     Plan:  ? Compression fracture vs cystic lesion over spine; repeat  lumbar CT; will most likely need to refer to neurosurgeon; follow-up to be determined.   No follow-ups on file.  Orders Placed This Encounter  Procedures  . CT Lumbar Spine Wo Contrast    Standing Status:   Future    Standing Expiration Date:   08/15/2020    Scheduling Instructions:     Call son Heather [name] [phone] to schedule    Order Specific Question:   ** REASON FOR EXAM (FREE TEXT)    Answer:   rule out spinal fracture    Order Specific Question:   Preferred imaging location?    Answer:   [facility]    Order Specific Question:   Call Results- Best Contact Number?    Answer:   [phone] ( call son to schedule)    Order Specific Question:   Radiology Contrast Protocol - do NOT remove file path    Answer:   \\[email or web address]    Requested Prescriptions    No prescriptions requested or ordered in this encounter

## 2019-05-17 ENCOUNTER — Telehealth: Payer: Self-pay | Admitting: *Deleted

## 2019-05-17 NOTE — Telephone Encounter (Signed)

## 2019-05-18 ENCOUNTER — Other Ambulatory Visit: Payer: Self-pay | Admitting: Family

## 2019-05-18 ENCOUNTER — Ambulatory Visit (INDEPENDENT_AMBULATORY_CARE_PROVIDER_SITE_OTHER)
Admission: RE | Admit: 2019-05-18 | Discharge: 2019-05-18 | Disposition: A | Discharge status: Home / Self Care | Payer: Medicare Other | Source: Ambulatory Visit | Attending: Family | Admitting: Family

## 2019-05-18 ENCOUNTER — Other Ambulatory Visit: Payer: Self-pay

## 2019-05-18 DIAGNOSIS — S32020A Wedge compression fracture of second lumbar vertebra, initial encounter for closed fracture: Secondary | ICD-10-CM | POA: Diagnosis not present

## 2019-05-18 DIAGNOSIS — S32020G Wedge compression fracture of second lumbar vertebra, subsequent encounter for fracture with delayed healing: Secondary | ICD-10-CM

## 2019-05-18 DIAGNOSIS — M545 Low back pain, unspecified: Secondary | ICD-10-CM

## 2019-05-18 DIAGNOSIS — G8929 Other chronic pain: Secondary | ICD-10-CM

## 2019-05-19 DIAGNOSIS — F329 Major depressive disorder, single episode, unspecified: Secondary | ICD-10-CM | POA: Diagnosis not present

## 2019-05-19 DIAGNOSIS — I2510 Atherosclerotic heart disease of native coronary artery without angina pectoris: Secondary | ICD-10-CM | POA: Diagnosis not present

## 2019-05-19 DIAGNOSIS — F419 Anxiety disorder, unspecified: Secondary | ICD-10-CM | POA: Diagnosis not present

## 2019-05-19 DIAGNOSIS — M6282 Rhabdomyolysis: Secondary | ICD-10-CM | POA: Diagnosis not present

## 2019-05-19 DIAGNOSIS — J439 Emphysema, unspecified: Secondary | ICD-10-CM | POA: Diagnosis not present

## 2019-05-19 DIAGNOSIS — I10 Essential (primary) hypertension: Secondary | ICD-10-CM | POA: Diagnosis not present

## 2019-05-20 ENCOUNTER — Ambulatory Visit: Payer: Self-pay

## 2019-05-20 ENCOUNTER — Telehealth: Payer: Self-pay | Admitting: Family

## 2019-05-20 ENCOUNTER — Telehealth: Payer: Self-pay | Admitting: *Deleted

## 2019-05-20 ENCOUNTER — Other Ambulatory Visit: Payer: Medicare Other

## 2019-05-20 DIAGNOSIS — Z20822 Contact with and (suspected) exposure to covid-19: Secondary | ICD-10-CM

## 2019-05-20 DIAGNOSIS — R6889 Other general symptoms and signs: Secondary | ICD-10-CM | POA: Diagnosis not present

## 2019-05-20 NOTE — Telephone Encounter (Signed)
Protocol given to son and message sent to [facility] to contact him and schedule patient.

## 2019-05-20 NOTE — Telephone Encounter (Signed)
Called son and left message with info.

## 2019-05-20 NOTE — Telephone Encounter (Signed)
Can you call them and discuss the COVID testing protocol? Okay to then order test for her today. I do not need to see her again today.

## 2019-05-20 NOTE — Telephone Encounter (Signed)
Marcina Millard, CMA  [facility] Testing Pool        Patient: Heather [name]  DOB: 01-05-1935   Reason: Possible COVID exposure/High Risk patient   Insurance: MEDICARE PART A AND B  ID# [id number]   Contact number: [phone]  Son: [name]    Call to patient's son and patient has been scheduled

## 2019-05-20 NOTE — Telephone Encounter (Signed)
Copied from [place] [phone]. Topic: Quick Communication - See Telephone Encounter >> May 20, 2019 10:30 AM Blase Mess A wrote: CRM for notification. See Telephone encounter for: 05/20/19.  Patient son's Anastasio Champion is calling to get the results of the patient's CT scan Please advise thank you CB- [phone]

## 2019-05-20 NOTE — Telephone Encounter (Signed)
Patient's son called and says he was exposed to a co-worker who he found out today that she tested positive for Covid. He says he doesn't have any symptoms and the patient doesn't have any symptoms. 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 He denies travel for him or his mother, but says they went out to have a CT scan recently. He denies exposure. I called the office and spoke to [facility] who advised to let him know he will receive a call back once Mickel Baas reviews the note. I advised the son and he verbalized understanding.  Answer Assessment - Initial Assessment Questions 1. CLOSE CONTACT: "Who is the person with the confirmed or suspected COVID-19 infection that you were exposed to?"     Son was exposed to a co-worker who tested positive 2. PLACE of CONTACT: "Where were you when you were exposed to COVID-19?" (e.g., home, school, medical waiting room; which city?)     Mother lives with son; son exposed at work in [place], [place] 3. 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 DURATION of CONTACT: "How long were you in contact with the COVID-19 patient?" (e.g., a few seconds, passed by person, a few minutes, live with the patient)     Passed by the person during 8 hour shift 5. DATE of CONTACT: "When did you have contact with a COVID-19 patient?" (e.g., how many days ago)     Son's last contact 5 days ago 6. TRAVEL: "Have you traveled out of the country recently?" If so, "When and where?"     * Also ask about out-of-state travel, since the CDC has identified some high-risk cities for community spread in the [place].     * Note: Travel becomes less relevant if there is widespread community transmission where the patient lives.     No travel for  mother 7. COMMUNITY SPREAD: "Are there lots of cases of COVID-19 (community spread) where you live?" (See public health department website, if unsure)       No 8. SYMPTOMS: "Do you have any symptoms?" (e.g., fever, cough, breathing difficulty)     No symptoms for patient or son 9. PREGNANCY OR POSTPARTUM: "Is there any chance you are pregnant?" "When was your last menstrual period?" "Did you deliver in the last 2 weeks?"     N/A 10. HIGH RISK: "Do you have any heart or lung problems? Do you have a weak immune system?" (e.g., CHF, COPD, asthma, HIV positive, chemotherapy, renal failure, diabetes mellitus, sickle cell anemia)      Yes  Protocols used: CORONAVIRUS (COVID-19) EXPOSURE-A-AH

## 2019-05-22 LAB — NOVEL CORONAVIRUS, NAA: SARS-CoV-2, NAA: NOT DETECTED

## 2019-05-24 ENCOUNTER — Telehealth: Payer: Self-pay | Admitting: Family

## 2019-05-24 ENCOUNTER — Other Ambulatory Visit: Payer: Self-pay | Admitting: Family Medicine

## 2019-05-24 ENCOUNTER — Other Ambulatory Visit: Payer: Self-pay | Admitting: Family

## 2019-05-24 NOTE — Telephone Encounter (Signed)
Remo Lipps, son, called for pt results. Advised him of negative/not detected result for COVID19.

## 2019-05-26 ENCOUNTER — Telehealth: Payer: Self-pay | Admitting: Family

## 2019-05-26 DIAGNOSIS — G4700 Insomnia, unspecified: Secondary | ICD-10-CM | POA: Diagnosis not present

## 2019-05-26 DIAGNOSIS — D649 Anemia, unspecified: Secondary | ICD-10-CM | POA: Diagnosis not present

## 2019-05-26 DIAGNOSIS — J9611 Chronic respiratory failure with hypoxia: Secondary | ICD-10-CM | POA: Diagnosis not present

## 2019-05-26 DIAGNOSIS — K5090 Crohn's disease, unspecified, without complications: Secondary | ICD-10-CM | POA: Diagnosis not present

## 2019-05-26 DIAGNOSIS — C3431 Malignant neoplasm of lower lobe, right bronchus or lung: Secondary | ICD-10-CM | POA: Diagnosis not present

## 2019-05-26 DIAGNOSIS — Z8719 Personal history of other diseases of the digestive system: Secondary | ICD-10-CM | POA: Diagnosis not present

## 2019-05-26 DIAGNOSIS — K219 Gastro-esophageal reflux disease without esophagitis: Secondary | ICD-10-CM | POA: Diagnosis not present

## 2019-05-26 DIAGNOSIS — J439 Emphysema, unspecified: Secondary | ICD-10-CM | POA: Diagnosis not present

## 2019-05-26 DIAGNOSIS — K529 Noninfective gastroenteritis and colitis, unspecified: Secondary | ICD-10-CM | POA: Diagnosis not present

## 2019-05-26 DIAGNOSIS — E440 Moderate protein-calorie malnutrition: Secondary | ICD-10-CM | POA: Diagnosis not present

## 2019-05-26 DIAGNOSIS — I2510 Atherosclerotic heart disease of native coronary artery without angina pectoris: Secondary | ICD-10-CM | POA: Diagnosis not present

## 2019-05-26 DIAGNOSIS — E876 Hypokalemia: Secondary | ICD-10-CM | POA: Diagnosis not present

## 2019-05-26 DIAGNOSIS — Z9181 History of falling: Secondary | ICD-10-CM | POA: Diagnosis not present

## 2019-05-26 DIAGNOSIS — I10 Essential (primary) hypertension: Secondary | ICD-10-CM | POA: Diagnosis not present

## 2019-05-26 DIAGNOSIS — F329 Major depressive disorder, single episode, unspecified: Secondary | ICD-10-CM | POA: Diagnosis not present

## 2019-05-26 DIAGNOSIS — E039 Hypothyroidism, unspecified: Secondary | ICD-10-CM | POA: Diagnosis not present

## 2019-05-26 DIAGNOSIS — K589 Irritable bowel syndrome without diarrhea: Secondary | ICD-10-CM | POA: Diagnosis not present

## 2019-05-26 DIAGNOSIS — F419 Anxiety disorder, unspecified: Secondary | ICD-10-CM | POA: Diagnosis not present

## 2019-05-26 DIAGNOSIS — D72829 Elevated white blood cell count, unspecified: Secondary | ICD-10-CM | POA: Diagnosis not present

## 2019-05-26 DIAGNOSIS — M47817 Spondylosis without myelopathy or radiculopathy, lumbosacral region: Secondary | ICD-10-CM | POA: Diagnosis not present

## 2019-05-26 DIAGNOSIS — R51 Headache: Secondary | ICD-10-CM | POA: Diagnosis not present

## 2019-05-26 DIAGNOSIS — K5730 Diverticulosis of large intestine without perforation or abscess without bleeding: Secondary | ICD-10-CM | POA: Diagnosis not present

## 2019-05-26 DIAGNOSIS — M6282 Rhabdomyolysis: Secondary | ICD-10-CM | POA: Diagnosis not present

## 2019-05-26 DIAGNOSIS — M48061 Spinal stenosis, lumbar region without neurogenic claudication: Secondary | ICD-10-CM | POA: Diagnosis not present

## 2019-05-26 DIAGNOSIS — E785 Hyperlipidemia, unspecified: Secondary | ICD-10-CM | POA: Diagnosis not present

## 2019-05-26 NOTE — Telephone Encounter (Signed)
Heather [name] calling from [place] called and stated that family is requesting to hold home health for PT and OT   until patient is seen by spine doctor.

## 2019-05-26 NOTE — Telephone Encounter (Signed)
Please advise 

## 2019-05-27 ENCOUNTER — Other Ambulatory Visit: Payer: Self-pay | Admitting: Family

## 2019-05-27 ENCOUNTER — Other Ambulatory Visit: Payer: Self-pay | Admitting: Pharmacist

## 2019-05-27 NOTE — Patient Outreach (Signed)
Heather [name]) Care Management  05/27/2019  Heather [name] 25-Jul-1935 [id number]   Called patient's son Heather [name] back to follow up with medication assistance assessment. HIPAA identifiers were obtained. Heather [name] confirmed that since we talked, he found out his mother has full extra help.  Plan: Close patient's pharmacy case. Send closure letters to patient and her provider.  [name], PharmD, [place] Clinical Pharmacist [phone]

## 2019-05-28 ENCOUNTER — Other Ambulatory Visit: Payer: Self-pay | Admitting: Family

## 2019-05-30 ENCOUNTER — Other Ambulatory Visit: Payer: Self-pay | Admitting: Pharmacist

## 2019-05-30 ENCOUNTER — Ambulatory Visit: Payer: Self-pay | Admitting: Pharmacist

## 2019-05-30 DIAGNOSIS — S32020A Wedge compression fracture of second lumbar vertebra, initial encounter for closed fracture: Secondary | ICD-10-CM | POA: Diagnosis not present

## 2019-05-30 NOTE — Patient Outreach (Signed)
[facility]) Care Management  05/30/2019  Heather [name] 1935/11/07 [id number]   Received a message from [place] that the patient's son was expecting a call from me today at 11:00am.  I did not have an appointment scheduled with the patient and closed her case last week after speaking with the patient's son Heather [name]).  On 05/27/2019, Heather [name] confirmed his mother had Full LIS/Extra Help. As such, she is not eligible for patient assistance programs.  Patient was referred for patient assistance.  Plan: Patient's case will remain closed. Case closure letters were sent on 05/27/2019.   [name], PharmD, [place] Clinical Pharmacist [phone]

## 2019-05-31 DIAGNOSIS — I2510 Atherosclerotic heart disease of native coronary artery without angina pectoris: Secondary | ICD-10-CM | POA: Diagnosis not present

## 2019-05-31 DIAGNOSIS — J439 Emphysema, unspecified: Secondary | ICD-10-CM | POA: Diagnosis not present

## 2019-05-31 DIAGNOSIS — F419 Anxiety disorder, unspecified: Secondary | ICD-10-CM | POA: Diagnosis not present

## 2019-05-31 DIAGNOSIS — F329 Major depressive disorder, single episode, unspecified: Secondary | ICD-10-CM | POA: Diagnosis not present

## 2019-05-31 DIAGNOSIS — I10 Essential (primary) hypertension: Secondary | ICD-10-CM | POA: Diagnosis not present

## 2019-05-31 DIAGNOSIS — M6282 Rhabdomyolysis: Secondary | ICD-10-CM | POA: Diagnosis not present

## 2019-06-03 ENCOUNTER — Telehealth: Payer: Self-pay | Admitting: Family

## 2019-06-03 DIAGNOSIS — J439 Emphysema, unspecified: Secondary | ICD-10-CM | POA: Diagnosis not present

## 2019-06-03 DIAGNOSIS — F329 Major depressive disorder, single episode, unspecified: Secondary | ICD-10-CM | POA: Diagnosis not present

## 2019-06-03 DIAGNOSIS — M6282 Rhabdomyolysis: Secondary | ICD-10-CM | POA: Diagnosis not present

## 2019-06-03 DIAGNOSIS — F419 Anxiety disorder, unspecified: Secondary | ICD-10-CM | POA: Diagnosis not present

## 2019-06-03 DIAGNOSIS — I2510 Atherosclerotic heart disease of native coronary artery without angina pectoris: Secondary | ICD-10-CM | POA: Diagnosis not present

## 2019-06-03 DIAGNOSIS — I10 Essential (primary) hypertension: Secondary | ICD-10-CM | POA: Diagnosis not present

## 2019-06-03 NOTE — Telephone Encounter (Signed)
Called and left message for Ratazio with verbal ok to continue home health OT

## 2019-06-03 NOTE — Telephone Encounter (Signed)
Ratazio w/ [facility] would like to continue home health OT Pt has missed several visits due to health issues, So visits have not been done.   1 wk 3  [phone]

## 2019-06-03 NOTE — Telephone Encounter (Signed)
I would like her to try to do OT; based on the note I got, she and her son have opted against treating the compression fracture at this time so we need to try and get some of her home health needs managed.

## 2019-06-07 DIAGNOSIS — M6282 Rhabdomyolysis: Secondary | ICD-10-CM | POA: Diagnosis not present

## 2019-06-07 DIAGNOSIS — I10 Essential (primary) hypertension: Secondary | ICD-10-CM | POA: Diagnosis not present

## 2019-06-07 DIAGNOSIS — I2510 Atherosclerotic heart disease of native coronary artery without angina pectoris: Secondary | ICD-10-CM | POA: Diagnosis not present

## 2019-06-07 DIAGNOSIS — F329 Major depressive disorder, single episode, unspecified: Secondary | ICD-10-CM | POA: Diagnosis not present

## 2019-06-07 DIAGNOSIS — F419 Anxiety disorder, unspecified: Secondary | ICD-10-CM | POA: Diagnosis not present

## 2019-06-07 DIAGNOSIS — J439 Emphysema, unspecified: Secondary | ICD-10-CM | POA: Diagnosis not present

## 2019-06-08 DIAGNOSIS — I2510 Atherosclerotic heart disease of native coronary artery without angina pectoris: Secondary | ICD-10-CM | POA: Diagnosis not present

## 2019-06-08 DIAGNOSIS — J439 Emphysema, unspecified: Secondary | ICD-10-CM | POA: Diagnosis not present

## 2019-06-08 DIAGNOSIS — I10 Essential (primary) hypertension: Secondary | ICD-10-CM | POA: Diagnosis not present

## 2019-06-08 DIAGNOSIS — M6282 Rhabdomyolysis: Secondary | ICD-10-CM | POA: Diagnosis not present

## 2019-06-08 DIAGNOSIS — F329 Major depressive disorder, single episode, unspecified: Secondary | ICD-10-CM | POA: Diagnosis not present

## 2019-06-08 DIAGNOSIS — F419 Anxiety disorder, unspecified: Secondary | ICD-10-CM | POA: Diagnosis not present

## 2019-06-09 ENCOUNTER — Telehealth: Payer: Self-pay | Admitting: Family

## 2019-06-09 DIAGNOSIS — F419 Anxiety disorder, unspecified: Secondary | ICD-10-CM | POA: Diagnosis not present

## 2019-06-09 DIAGNOSIS — I2510 Atherosclerotic heart disease of native coronary artery without angina pectoris: Secondary | ICD-10-CM | POA: Diagnosis not present

## 2019-06-09 DIAGNOSIS — J439 Emphysema, unspecified: Secondary | ICD-10-CM | POA: Diagnosis not present

## 2019-06-09 DIAGNOSIS — M6282 Rhabdomyolysis: Secondary | ICD-10-CM | POA: Diagnosis not present

## 2019-06-09 DIAGNOSIS — F329 Major depressive disorder, single episode, unspecified: Secondary | ICD-10-CM | POA: Diagnosis not present

## 2019-06-09 DIAGNOSIS — I10 Essential (primary) hypertension: Secondary | ICD-10-CM | POA: Diagnosis not present

## 2019-06-09 NOTE — Telephone Encounter (Signed)
Heather [name] is calling stating pt's son is requesting to cancel PT visit this week but they will resume next week.FYI

## 2019-06-10 ENCOUNTER — Other Ambulatory Visit: Payer: Self-pay | Admitting: Family

## 2019-06-10 DIAGNOSIS — J439 Emphysema, unspecified: Secondary | ICD-10-CM | POA: Diagnosis not present

## 2019-06-10 DIAGNOSIS — M6282 Rhabdomyolysis: Secondary | ICD-10-CM | POA: Diagnosis not present

## 2019-06-10 DIAGNOSIS — F329 Major depressive disorder, single episode, unspecified: Secondary | ICD-10-CM | POA: Diagnosis not present

## 2019-06-10 DIAGNOSIS — F419 Anxiety disorder, unspecified: Secondary | ICD-10-CM | POA: Diagnosis not present

## 2019-06-10 DIAGNOSIS — I10 Essential (primary) hypertension: Secondary | ICD-10-CM | POA: Diagnosis not present

## 2019-06-10 DIAGNOSIS — I2510 Atherosclerotic heart disease of native coronary artery without angina pectoris: Secondary | ICD-10-CM | POA: Diagnosis not present

## 2019-06-13 ENCOUNTER — Telehealth: Payer: Self-pay | Admitting: Family

## 2019-06-13 ENCOUNTER — Other Ambulatory Visit: Payer: Self-pay | Admitting: Family

## 2019-06-13 DIAGNOSIS — C3431 Malignant neoplasm of lower lobe, right bronchus or lung: Secondary | ICD-10-CM

## 2019-06-13 NOTE — Telephone Encounter (Signed)
Spoke with patient's son and info given regarding CT scan.

## 2019-06-13 NOTE — Telephone Encounter (Signed)
Please remind her that her pulmonologist wanted [place] to get a repeat CT in early July to follow-up on the suspected pneumonia from May; order has been updated;

## 2019-06-14 ENCOUNTER — Encounter: Payer: Self-pay | Admitting: Family

## 2019-06-14 ENCOUNTER — Ambulatory Visit (INDEPENDENT_AMBULATORY_CARE_PROVIDER_SITE_OTHER): Payer: Medicare Other | Admitting: Family

## 2019-06-14 DIAGNOSIS — R350 Frequency of micturition: Secondary | ICD-10-CM | POA: Diagnosis not present

## 2019-06-14 DIAGNOSIS — I2510 Atherosclerotic heart disease of native coronary artery without angina pectoris: Secondary | ICD-10-CM | POA: Diagnosis not present

## 2019-06-14 DIAGNOSIS — J439 Emphysema, unspecified: Secondary | ICD-10-CM | POA: Diagnosis not present

## 2019-06-14 DIAGNOSIS — I10 Essential (primary) hypertension: Secondary | ICD-10-CM | POA: Diagnosis not present

## 2019-06-14 DIAGNOSIS — M6282 Rhabdomyolysis: Secondary | ICD-10-CM | POA: Diagnosis not present

## 2019-06-14 DIAGNOSIS — F329 Major depressive disorder, single episode, unspecified: Secondary | ICD-10-CM | POA: Diagnosis not present

## 2019-06-14 DIAGNOSIS — F419 Anxiety disorder, unspecified: Secondary | ICD-10-CM | POA: Diagnosis not present

## 2019-06-14 MED ORDER — NITROFURANTOIN MONOHYD MACRO 100 MG PO CAPS: 100.0000 mg | ORAL_CAPSULE | Freq: Two times a day (BID) | ORAL | 0 refills | Status: DC

## 2019-06-14 NOTE — Progress Notes (Signed)
Heather [name] is a 84 y.o. female with the following history as recorded in EpicCare:  Patient Active Problem List   Diagnosis Date Noted  . Fall 04/22/2019  . Elevated CK 04/22/2019  . Leukocytosis 04/22/2019  . Malignant neoplasm of lower lobe of right lung ([place]) 11/16/2018  . Dehydration   . Abdominal pain 02/27/2018  . Colitis 02/27/2018  . SBO (small bowel obstruction) ([place]) 02/27/2018  . Hypokalemia 02/19/2018  . Oral thrush 01/29/2018  . Malnutrition of moderate degree 01/29/2018  . COPD with acute exacerbation ([place]) 01/28/2018  . COPD with emphysema ([place]) 06/26/2017  . COPD exacerbation ([place]) 05/29/2016  . Hypoxia 05/29/2016  . Coronary atherosclerosis 07/24/2015  . Atherosclerosis of aorta ([place]) 07/24/2015  . Dysphagia 07/24/2015  . Acute respiratory failure with hypoxia ([place]) 07/23/2015  . Bronchospasm with bronchitis, acute 07/23/2015  . HIP PAIN, LEFT 05/16/2010  . LOW BACK PAIN, CHRONIC 10/01/2009  . PARESTHESIA 10/01/2009  . Hypothyroidism 07/23/2009  . Dyslipidemia 07/23/2009  . Anxiety 07/23/2009  . Anxiety and depression 07/23/2009  . Essential hypertension 07/23/2009  . GERD 07/23/2009  . DIVERTICULOSIS, COLON 07/23/2009  . HEADACHE 07/23/2009    Current Outpatient Medications  Medication Sig Dispense Refill  . Albuterol Sulfate (PROAIR RESPICLICK) 086 (90 Base) MCG/ACT AEPB Inhale 2 puffs into the lungs 4 (four) times daily as needed. (Patient taking differently: Inhale 2 puffs into the lungs 4 (four) times daily as needed (sob and wheezing). ) 1 each 6  . amoxicillin-clavulanate (AUGMENTIN) 875-125 MG tablet Take 1 tablet by mouth 2 (two) times daily. 20 tablet 0  . benzonatate (TESSALON) 100 MG capsule TAKE 1 CAPSULE TWICE DAILY AS NEEDED FOR COUGH. (Patient taking differently: Take 100 mg by mouth 2 (two) times daily as needed for cough. ) 20 capsule 0  . BREO ELLIPTA 200-25 MCG/INH AEPB INHALE 1 PUFF INTO THE LUNGS ONCE DAILY. 60 each 3  .  clorazepate (TRANXENE) 15 MG tablet TAKE (1) TABLET TWICE DAILY AS NEEDED FOR ANXIETY. 60 tablet 1  . diltiazem (CARDIZEM CD) 240 MG 24 hr capsule Take 1 capsule (240 mg total) by mouth daily. 90 capsule 1  . esomeprazole (NEXIUM) 40 MG capsule Take 1 capsule (40 mg total) by mouth daily. 90 capsule 3  . folic acid (FOLVITE) 1 MG tablet TAKE 1 TABLET ONCE DAILY. 90 tablet 0  . furosemide (LASIX) 20 MG tablet TAKE 1 TABLET DAILY IF NEEDED FOR SIGNIFICANT LOWER EXTREMITY SWELLING. 90 tablet 0  . HYDROcodone-acetaminophen (NORCO/VICODIN) 5-325 MG tablet TAKE 1 TABLET EVERY 6-8 HOURS AS NEEDED FOR SEVERE PAIN NOT CONTROLLED BY TRAMADOL. 60 tablet 0  . levothyroxine (SYNTHROID) 25 MCG tablet TAKE 1 TABLET DAILY BEFORE BREAKFAST. 90 tablet 3  . Multiple Vitamin (MULTIVITAMIN WITH MINERALS) TABS tablet Take 1 tablet by mouth daily. 30 tablet 1  . Polyethyl Glycol-Propyl Glycol (SYSTANE OP) Place 1 drop into both eyes 2 (two) times daily as needed (for dry eyes).     . promethazine (PHENERGAN) 25 MG tablet TAKE 1 TABLET EVERY 8 HOURS AS NEEDED FOR NAUSEA & VOMITING. 30 tablet 0  . senna-docusate (SENOKOT-S) 8.6-50 MG tablet Take 2 tablets by mouth at bedtime. 60 tablet 2  . sertraline (ZOLOFT) 50 MG tablet TAKE 1 TABLET EACH DAY. 90 tablet 0  . traMADol (ULTRAM) 50 MG tablet TAKE 1-2 TABLETS EVERY 6 HOURS AS NEEDED FOR MODERATE TO SEVERE PAIN. 120 tablet 0  . traZODone (DESYREL) 50 MG tablet TAKE 1/2 TO 1 TABLET AT BEDTIME  AS NEEDED FOR REST. 90 tablet 0  . zolpidem (AMBIEN) 5 MG tablet Take 1 tablet (5 mg total) by mouth at bedtime as needed. 30 tablet 0   No current facility-administered medications for this visit.     Allergies: Tylenol [acetaminophen] and Symbicort [budesonide-formoterol fumarate]  Past Medical History:  Diagnosis Date  . ANXIETY 07/23/2009  . Atherosclerosis of aorta ([place]) 07/24/2015  . Complication of anesthesia 7 or 8 yrs ago   woke up during colonscopy  . COPD (chronic  obstructive pulmonary disease) ([place])   . Coronary atherosclerosis 07/24/2015  . DEPRESSION 07/23/2009  . DIVERTICULOSIS, COLON 07/23/2009  . GERD 07/23/2009  . Headache(784.0) 07/23/2009   occasional  . Hemorrhoids   . HIP PAIN, LEFT 05/16/2010  . History of Crohn's disease   . HYPERLIPIDEMIA 07/23/2009  . HYPERTENSION 07/23/2009  . HYPOTHYROIDISM 07/23/2009  . IBS (irritable bowel syndrome)   . Insomnia   . LOW BACK PAIN, CHRONIC 10/01/2009  . PARESTHESIA 10/01/2009  . Shingles 2006   back    Past Surgical History:  Procedure Laterality Date  . ABDOMINAL HYSTERECTOMY  age 61 or 73  . APPENDECTOMY  yrs ago  . BILATERAL SALPINGOOPHORECTOMY  age 5 or 81  . COLONOSCOPY N/A 11/17/2013   Procedure: COLONOSCOPY;  Surgeon: Cleotis Nipper, MD;  Location: [facility];  Service: Endoscopy;  Laterality: N/A;  . ESOPHAGOGASTRODUODENOSCOPY N/A 11/17/2013   Procedure: ESOPHAGOGASTRODUODENOSCOPY (EGD);  Surgeon: Cleotis Nipper, MD;  Location: [facility];  Service: Endoscopy;  Laterality: N/A;  . HEMORRHOID SURGERY  yrs ago  . TONSILLECTOMY  yrs ago    Family History  Problem Relation Age of Onset  . Cancer Mother        Unknown type  . Cancer Sister        Breast  . Clotting disorder Neg Hx     Social History   Tobacco Use  . Smoking status: Former Smoker    Packs/day: 0.50    Types: Cigarettes    Start date: 07/16/1960    Quit date: 07/16/1998    Years since quitting: 20.9  . Smokeless tobacco: Never Used  Substance Use Topics  . Alcohol use: No    Alcohol/week: 0.0 standard drinks    Subjective:    I connected with Heather [name] on 06/14/19 at 11:40 AM EDT by a video enabled telemedicine application and verified that I am speaking with the correct person using two identifiers. Patient, her daughter-in-law and I are the only 2 people on the video call.    I discussed the limitations of evaluation and management by telemedicine and the availability of in person  appointments. The patient expressed understanding and agreed to proceed.  Complaining of 3 week history of increased urinary frequency; denies any burning with urination or blood in urine; denies any back pain; was treated for a UTI at the beginning of June- repeat culture did indicate resolution;    Objective:  There were no vitals filed for this visit.  General: Well developed, well nourished, in no acute distress  Lungs: Respirations unlabored;  Neurologic: Alert and oriented; speech intact; face symmetrical;   Assessment:  1. Urinary frequency     Plan:  ? Underlying UTI; will have family member drop off urine sample to check for underlying infection; in the interim, will go ahead and treat for possible infection; will call in Macrobid x 7 days; follow-up to be determined.   No follow-ups on file.  Orders Placed This Encounter  Procedures  . Urine Culture    Standing Status:   Future    Standing Expiration Date:   06/13/2020  . Urinalysis    Standing Status:   Future    Standing Expiration Date:   06/13/2020    Requested Prescriptions    No prescriptions requested or ordered in this encounter

## 2019-06-15 ENCOUNTER — Other Ambulatory Visit (INDEPENDENT_AMBULATORY_CARE_PROVIDER_SITE_OTHER): Payer: Medicare Other

## 2019-06-15 DIAGNOSIS — M6282 Rhabdomyolysis: Secondary | ICD-10-CM | POA: Diagnosis not present

## 2019-06-15 DIAGNOSIS — R350 Frequency of micturition: Secondary | ICD-10-CM | POA: Diagnosis not present

## 2019-06-15 DIAGNOSIS — F419 Anxiety disorder, unspecified: Secondary | ICD-10-CM | POA: Diagnosis not present

## 2019-06-15 DIAGNOSIS — J439 Emphysema, unspecified: Secondary | ICD-10-CM | POA: Diagnosis not present

## 2019-06-15 DIAGNOSIS — I2510 Atherosclerotic heart disease of native coronary artery without angina pectoris: Secondary | ICD-10-CM | POA: Diagnosis not present

## 2019-06-15 DIAGNOSIS — I10 Essential (primary) hypertension: Secondary | ICD-10-CM | POA: Diagnosis not present

## 2019-06-15 DIAGNOSIS — F329 Major depressive disorder, single episode, unspecified: Secondary | ICD-10-CM | POA: Diagnosis not present

## 2019-06-15 LAB — URINALYSIS
Bilirubin Urine: NEGATIVE
Hgb urine dipstick: NEGATIVE
Ketones, ur: 15 — AB
Leukocytes,Ua: NEGATIVE
Nitrite: NEGATIVE
Specific Gravity, Urine: 1.01 (ref 1.000–1.030)
Total Protein, Urine: NEGATIVE
Urine Glucose: NEGATIVE
Urobilinogen, UA: 0.2 (ref 0.0–1.0)
pH: 6.5 (ref 5.0–8.0)

## 2019-06-16 DIAGNOSIS — I2510 Atherosclerotic heart disease of native coronary artery without angina pectoris: Secondary | ICD-10-CM | POA: Diagnosis not present

## 2019-06-16 DIAGNOSIS — M6282 Rhabdomyolysis: Secondary | ICD-10-CM | POA: Diagnosis not present

## 2019-06-16 DIAGNOSIS — F329 Major depressive disorder, single episode, unspecified: Secondary | ICD-10-CM | POA: Diagnosis not present

## 2019-06-16 DIAGNOSIS — F419 Anxiety disorder, unspecified: Secondary | ICD-10-CM | POA: Diagnosis not present

## 2019-06-16 DIAGNOSIS — J439 Emphysema, unspecified: Secondary | ICD-10-CM | POA: Diagnosis not present

## 2019-06-16 DIAGNOSIS — I10 Essential (primary) hypertension: Secondary | ICD-10-CM | POA: Diagnosis not present

## 2019-06-16 LAB — URINE CULTURE
MICRO NUMBER:: 645971
Result:: NO GROWTH
SPECIMEN QUALITY:: ADEQUATE

## 2019-06-17 DIAGNOSIS — F329 Major depressive disorder, single episode, unspecified: Secondary | ICD-10-CM | POA: Diagnosis not present

## 2019-06-17 DIAGNOSIS — M6282 Rhabdomyolysis: Secondary | ICD-10-CM | POA: Diagnosis not present

## 2019-06-17 DIAGNOSIS — F419 Anxiety disorder, unspecified: Secondary | ICD-10-CM | POA: Diagnosis not present

## 2019-06-17 DIAGNOSIS — I2510 Atherosclerotic heart disease of native coronary artery without angina pectoris: Secondary | ICD-10-CM | POA: Diagnosis not present

## 2019-06-17 DIAGNOSIS — I10 Essential (primary) hypertension: Secondary | ICD-10-CM | POA: Diagnosis not present

## 2019-06-17 DIAGNOSIS — J439 Emphysema, unspecified: Secondary | ICD-10-CM | POA: Diagnosis not present

## 2019-06-20 ENCOUNTER — Other Ambulatory Visit: Payer: Self-pay | Admitting: Family

## 2019-06-20 ENCOUNTER — Telehealth: Payer: Self-pay

## 2019-06-20 DIAGNOSIS — Z8639 Personal history of other endocrine, nutritional and metabolic disease: Secondary | ICD-10-CM

## 2019-06-20 DIAGNOSIS — R350 Frequency of micturition: Secondary | ICD-10-CM

## 2019-06-20 MED ORDER — HYDROCODONE-ACETAMINOPHEN 5-325 MG PO TABS: ORAL_TABLET | ORAL | 0 refills | Status: DC

## 2019-06-21 ENCOUNTER — Other Ambulatory Visit (INDEPENDENT_AMBULATORY_CARE_PROVIDER_SITE_OTHER): Payer: Medicare Other

## 2019-06-21 DIAGNOSIS — Z8639 Personal history of other endocrine, nutritional and metabolic disease: Secondary | ICD-10-CM | POA: Diagnosis not present

## 2019-06-21 DIAGNOSIS — R350 Frequency of micturition: Secondary | ICD-10-CM | POA: Diagnosis not present

## 2019-06-21 LAB — CBC WITH DIFFERENTIAL/PLATELET
Basophils Absolute: 0 10*3/uL (ref 0.0–0.1)
Basophils Relative: 0.6 % (ref 0.0–3.0)
Eosinophils Absolute: 0.1 10*3/uL (ref 0.0–0.7)
Eosinophils Relative: 1.3 % (ref 0.0–5.0)
HCT: 33.4 % — ABNORMAL LOW (ref 36.0–46.0)
Hemoglobin: 10.6 g/dL — ABNORMAL LOW (ref 12.0–15.0)
Lymphocytes Relative: 23.7 % (ref 12.0–46.0)
Lymphs Abs: 1.6 10*3/uL (ref 0.7–4.0)
MCHC: 31.7 g/dL (ref 30.0–36.0)
MCV: 83.6 fl (ref 78.0–100.0)
Monocytes Absolute: 0.5 10*3/uL (ref 0.1–1.0)
Monocytes Relative: 8.1 % (ref 3.0–12.0)
Neutro Abs: 4.3 10*3/uL (ref 1.4–7.7)
Neutrophils Relative %: 66.3 % (ref 43.0–77.0)
Platelets: 349 10*3/uL (ref 150.0–400.0)
RBC: 3.99 Mil/uL (ref 3.87–5.11)
RDW: 16.8 % — ABNORMAL HIGH (ref 11.5–15.5)
WBC: 6.6 10*3/uL (ref 4.0–10.5)

## 2019-06-21 LAB — COMPREHENSIVE METABOLIC PANEL
ALT: 6 U/L (ref 0–35)
AST: 10 U/L (ref 0–37)
Albumin: 4.2 g/dL (ref 3.5–5.2)
Alkaline Phosphatase: 74 U/L (ref 39–117)
BUN: 10 mg/dL (ref 6–23)
CO2: 28 mEq/L (ref 19–32)
Calcium: 9.2 mg/dL (ref 8.4–10.5)
Chloride: 103 mEq/L (ref 96–112)
Creatinine, Ser: 0.76 mg/dL (ref 0.40–1.20)
GFR: 72.86 mL/min (ref >60.00)
Glucose, Bld: 110 mg/dL — ABNORMAL HIGH (ref 70–99)
Potassium: 3.1 mEq/L — ABNORMAL LOW (ref 3.5–5.1)
Sodium: 140 mEq/L (ref 135–145)
Total Bilirubin: 0.3 mg/dL (ref 0.2–1.2)
Total Protein: 6.6 g/dL (ref 6.0–8.3)

## 2019-06-21 LAB — HEMOGLOBIN A1C: Hgb A1c MFr Bld: 5.7 % (ref 4.6–6.5)

## 2019-06-21 NOTE — Telephone Encounter (Signed)
This has been taken care of.

## 2019-06-22 ENCOUNTER — Other Ambulatory Visit: Payer: Self-pay | Admitting: Family

## 2019-06-22 MED ORDER — MIRABEGRON ER 25 MG PO TB24: 25.0000 mg | ORAL_TABLET | Freq: Every day | ORAL | 1 refills | Status: DC

## 2019-06-22 MED ORDER — POTASSIUM CHLORIDE CRYS ER 20 MEQ PO TBCR: 20.0000 meq | EXTENDED_RELEASE_TABLET | Freq: Every day | ORAL | 0 refills | Status: DC

## 2019-06-23 DIAGNOSIS — I10 Essential (primary) hypertension: Secondary | ICD-10-CM | POA: Diagnosis not present

## 2019-06-23 DIAGNOSIS — M6282 Rhabdomyolysis: Secondary | ICD-10-CM | POA: Diagnosis not present

## 2019-06-23 DIAGNOSIS — F329 Major depressive disorder, single episode, unspecified: Secondary | ICD-10-CM | POA: Diagnosis not present

## 2019-06-23 DIAGNOSIS — J439 Emphysema, unspecified: Secondary | ICD-10-CM | POA: Diagnosis not present

## 2019-06-23 DIAGNOSIS — F419 Anxiety disorder, unspecified: Secondary | ICD-10-CM | POA: Diagnosis not present

## 2019-06-23 DIAGNOSIS — I2510 Atherosclerotic heart disease of native coronary artery without angina pectoris: Secondary | ICD-10-CM | POA: Diagnosis not present

## 2019-06-24 DIAGNOSIS — F329 Major depressive disorder, single episode, unspecified: Secondary | ICD-10-CM | POA: Diagnosis not present

## 2019-06-24 DIAGNOSIS — I10 Essential (primary) hypertension: Secondary | ICD-10-CM | POA: Diagnosis not present

## 2019-06-24 DIAGNOSIS — I2510 Atherosclerotic heart disease of native coronary artery without angina pectoris: Secondary | ICD-10-CM | POA: Diagnosis not present

## 2019-06-24 DIAGNOSIS — F419 Anxiety disorder, unspecified: Secondary | ICD-10-CM | POA: Diagnosis not present

## 2019-06-24 DIAGNOSIS — J439 Emphysema, unspecified: Secondary | ICD-10-CM | POA: Diagnosis not present

## 2019-06-24 DIAGNOSIS — M6282 Rhabdomyolysis: Secondary | ICD-10-CM | POA: Diagnosis not present

## 2019-06-27 DIAGNOSIS — I10 Essential (primary) hypertension: Secondary | ICD-10-CM | POA: Diagnosis not present

## 2019-06-27 DIAGNOSIS — S32020A Wedge compression fracture of second lumbar vertebra, initial encounter for closed fracture: Secondary | ICD-10-CM | POA: Diagnosis not present

## 2019-06-28 ENCOUNTER — Telehealth: Payer: Self-pay

## 2019-06-28 NOTE — Telephone Encounter (Signed)
Message sent to Mickel Baas to address.

## 2019-06-29 NOTE — Telephone Encounter (Signed)
Had to leave message; agree that patient should not be living at home by herself; he can call back if he has further questions or concerns.

## 2019-07-04 ENCOUNTER — Ambulatory Visit
Admission: RE | Admit: 2019-07-04 | Discharge: 2019-07-04 | Disposition: A | Discharge status: Home / Self Care | Payer: Medicare Other | Source: Ambulatory Visit | Attending: Family | Admitting: Family

## 2019-07-04 DIAGNOSIS — J439 Emphysema, unspecified: Secondary | ICD-10-CM | POA: Diagnosis not present

## 2019-07-04 DIAGNOSIS — Z87891 Personal history of nicotine dependence: Secondary | ICD-10-CM | POA: Diagnosis not present

## 2019-07-04 DIAGNOSIS — C3431 Malignant neoplasm of lower lobe, right bronchus or lung: Secondary | ICD-10-CM

## 2019-07-04 DIAGNOSIS — J181 Lobar pneumonia, unspecified organism: Secondary | ICD-10-CM | POA: Diagnosis not present

## 2019-07-10 ENCOUNTER — Other Ambulatory Visit: Payer: Self-pay | Admitting: Family

## 2019-07-17 ENCOUNTER — Other Ambulatory Visit: Payer: Self-pay | Admitting: Family

## 2019-07-18 ENCOUNTER — Other Ambulatory Visit: Payer: Self-pay

## 2019-07-18 ENCOUNTER — Ambulatory Visit (INDEPENDENT_AMBULATORY_CARE_PROVIDER_SITE_OTHER): Payer: Medicare Other | Admitting: Pulmonary Disease

## 2019-07-18 ENCOUNTER — Encounter: Payer: Self-pay | Admitting: Pulmonary Disease

## 2019-07-18 DIAGNOSIS — R918 Other nonspecific abnormal finding of lung field: Secondary | ICD-10-CM | POA: Diagnosis not present

## 2019-07-18 NOTE — Patient Instructions (Signed)
Abnormal CT scan of the chest status post radiation treatment Lung scarring  Compression fracture  You are stable from a breathing perspective to go for your procedure to stabilize the spine  We will follow-up your CT in 6 months I will see you back in the office in 6 months

## 2019-07-18 NOTE — Progress Notes (Signed)
Heather [name]    [id number]    [birth year]  Primary Care Physician:Murray, Marvis Repress, FNP  Referring Physician: Marrian Salvage, [address] [place],  [place] [id number]  Chief complaint:   Presumed lung cancer status post radiation treatment  HPI:  Status post radiation treatment for presumed lung cancer PET positive, Had a fall recently causing compression fractures  Considering vertebroplasty  She has been doing well with respect to her breathing  She is still quite frail Elderly lady with a history of chronic obstructive pulmonary disease-controlled on inhalers  Historically She had had a CT scan of the chest in February showing a 1 cm opacity in the right lower lobe-could be related to mucous plugging Repeat CT in July 2019 did reveal a 1.1 cm opacity-this was followed up with a PET scan with an SUV of 2.7-we did look at the CT together during the last visit We discussed options of intervention which at present is very risky as she is quite frail We agreed on repeating CT after 3 months to see if there is any significant evolution-the repeat CT does reveal increase in the size of the nodule  Occupation: No significant occupational history Exposures: No exposure to mold Smoking history: Reformed smoker Travel history: No Significant travel  Outpatient Encounter Medications as of 07/18/2019  Medication Sig  . Albuterol Sulfate (PROAIR RESPICLICK) 557 (90 Base) MCG/ACT AEPB Inhale 2 puffs into the lungs 4 (four) times daily as needed. (Patient taking differently: Inhale 2 puffs into the lungs 4 (four) times daily as needed (sob and wheezing). )  . benzonatate (TESSALON) 100 MG capsule TAKE 1 CAPSULE TWICE DAILY AS NEEDED FOR COUGH. (Patient taking differently: Take 100 mg by mouth 2 (two) times daily as needed for cough. )  . BREO ELLIPTA 200-25 MCG/INH AEPB INHALE 1 PUFF INTO THE LUNGS ONCE DAILY.  . clorazepate (TRANXENE) 15 MG tablet TAKE  (1) TABLET TWICE DAILY AS NEEDED FOR ANXIETY.  [name] diltiazem (CARDIZEM CD) 240 MG 24 hr capsule Take 1 capsule (240 mg total) by mouth daily.  [name] esomeprazole (NEXIUM) 40 MG capsule Take 1 capsule (40 mg total) by mouth daily.  . folic acid (FOLVITE) 1 MG tablet TAKE 1 TABLET ONCE DAILY.  . furosemide (LASIX) 20 MG tablet TAKE 1 TABLET DAILY IF NEEDED FOR SIGNIFICANT LOWER EXTREMITY SWELLING.  [name] HYDROcodone-acetaminophen (NORCO/VICODIN) 5-325 MG tablet Take 1 tablet every 4-6 hours prn for pain  . levothyroxine (SYNTHROID) 25 MCG tablet TAKE 1 TABLET DAILY BEFORE BREAKFAST.  [name] mirabegron ER (MYRBETRIQ) 25 MG TB24 tablet Take 1 tablet (25 mg total) by mouth daily.  . Multiple Vitamin (MULTIVITAMIN WITH MINERALS) TABS tablet Take 1 tablet by mouth daily.  . nitrofurantoin, macrocrystal-monohydrate, (MACROBID) 100 MG capsule Take 1 capsule (100 mg total) by mouth 2 (two) times daily.  Vladimir Faster Glycol-Propyl Glycol (SYSTANE OP) Place 1 drop into both eyes 2 (two) times daily as needed (for dry eyes).   . potassium chloride SA (K-DUR) 20 MEQ tablet Take 1 tablet (20 mEq total) by mouth daily.  . promethazine (PHENERGAN) 25 MG tablet TAKE 1 TABLET EVERY 8 HOURS AS NEEDED FOR NAUSEA & VOMITING.  . senna-docusate (SENOKOT-S) 8.6-50 MG tablet Take 2 tablets by mouth at bedtime.  . sertraline (ZOLOFT) 50 MG tablet TAKE 1 TABLET EACH DAY.  . traMADol (ULTRAM) 50 MG tablet TAKE 1-2 TABLETS EVERY 6 HOURS AS NEEDED FOR MODERATE TO SEVERE PAIN.  [name]  traZODone (DESYREL) 50 MG tablet TAKE 1/2 TO 1 TABLET AT BEDTIME AS NEEDED FOR REST.  [name] zolpidem (AMBIEN) 5 MG tablet TAKE 1 TABLET AT BEDTIME AS NEEDED.   No facility-administered encounter medications on file as of 07/18/2019.     Allergies as of 07/18/2019 - Review Complete 07/18/2019  Allergen Reaction Noted  . Tylenol [acetaminophen] Other (See Comments) 12/11/2015  . Symbicort [budesonide-formoterol fumarate] Other (See Comments) 10/04/2013    Past  Medical History:  Diagnosis Date  . ANXIETY 07/23/2009  . Atherosclerosis of aorta ([place]) 07/24/2015  . Complication of anesthesia 7 or 8 yrs ago   woke up during colonscopy  . COPD (chronic obstructive pulmonary disease) ([place])   . Coronary atherosclerosis 07/24/2015  . DEPRESSION 07/23/2009  . DIVERTICULOSIS, COLON 07/23/2009  . GERD 07/23/2009  . Headache(784.0) 07/23/2009   occasional  . Hemorrhoids   . HIP PAIN, LEFT 05/16/2010  . History of Crohn's disease   . HYPERLIPIDEMIA 07/23/2009  . HYPERTENSION 07/23/2009  . HYPOTHYROIDISM 07/23/2009  . IBS (irritable bowel syndrome)   . Insomnia   . LOW BACK PAIN, CHRONIC 10/01/2009  . PARESTHESIA 10/01/2009  . Shingles 2006   back    Past Surgical History:  Procedure Laterality Date  . ABDOMINAL HYSTERECTOMY  age 65 or 50  . APPENDECTOMY  yrs ago  . BILATERAL SALPINGOOPHORECTOMY  age 26 or 56  . COLONOSCOPY N/A 11/17/2013   Procedure: COLONOSCOPY;  Surgeon: Cleotis Nipper, MD;  Location: [facility];  Service: Endoscopy;  Laterality: N/A;  . ESOPHAGOGASTRODUODENOSCOPY N/A 11/17/2013   Procedure: ESOPHAGOGASTRODUODENOSCOPY (EGD);  Surgeon: Cleotis Nipper, MD;  Location: [facility];  Service: Endoscopy;  Laterality: N/A;  . HEMORRHOID SURGERY  yrs ago  . TONSILLECTOMY  yrs ago    Family History  Problem Relation Age of Onset  . Cancer Mother        Unknown type  . Cancer Sister        Breast  . Clotting disorder Neg Hx     Social History   Socioeconomic History  . Marital status: Widowed    Spouse name: Not on file  . Number of children: 1  . Years of education: Not on file  . Highest education level: Not on file  Occupational History  . Not on file  Social Needs  . Financial resource strain: Not hard at all  . Food insecurity    Worry: Never true    Inability: Never true  . Transportation needs    Medical: No    Non-medical: No  Tobacco Use  . Smoking status: Former Smoker    Packs/day: 0.50    Types:  Cigarettes    Start date: 07/16/1960    Quit date: 07/16/1998    Years since quitting: 21.0  . Smokeless tobacco: Never Used  Substance and Sexual Activity  . Alcohol use: No    Alcohol/week: 0.0 standard drinks  . Drug use: No  . Sexual activity: Never  Lifestyle  . Physical activity    Days per week: 0 days    Minutes per session: 0 min  . Stress: To some extent  Relationships  . Social connections    Talks on phone: More than three times a week    Gets together: More than three times a week    Attends religious service: Never    Active member of club or organization: No    Attends meetings of clubs or organizations: Never    Relationship status: Widowed  .  Intimate partner violence    Fear of current or ex partner: Not on file    Emotionally abused: Not on file    Physically abused: Not on file    Forced sexual activity: Not on file  Other Topics Concern  . Not on file  Social History Narrative   Widowed.  Lives alone in her own home.  Ambulates with a cane/walker when needed.    Review of systems: Constitutional: No fever, no chills Denies any significant respiratory complaints Has significant back pain  Physical Exam:  Vitals:   07/18/19 1555  BP: 122/68  Pulse: 80  Temp: 98.9 F (37.2 C)  SpO2: 94%   Gen:   Frail elderly lady Neck:     Moist oral mucosa Lungs:    No wheezes, no rales, fair air entry CV:         S1-S2 appreciated  Data Reviewed: .  CT scan of the chest reviewed showing scarring at the site of previous lung nodule  Assessment:   Presumed lung cancer status post radiation treatment Tolerated radiation treatment well  Recent fall with compression fractures  Chronic obstructive pulmonary disease/emphysema  Shortness of breath with activity  Plan/Recommendations:  Continue lines of care  Repeat CT scan of the chest in 6 months   She is stable from a respiratory perspective to go ahead with vertebroplasty   Sherrilyn Rist MD  [facility] 07/18/2019, 4:09 PM  CC: Marrian Salvage,*

## 2019-07-21 DIAGNOSIS — Z1159 Encounter for screening for other viral diseases: Secondary | ICD-10-CM | POA: Diagnosis not present

## 2019-07-23 ENCOUNTER — Other Ambulatory Visit: Payer: Self-pay | Admitting: Family

## 2019-07-27 DIAGNOSIS — S32020G Wedge compression fracture of second lumbar vertebra, subsequent encounter for fracture with delayed healing: Secondary | ICD-10-CM | POA: Diagnosis not present

## 2019-07-27 DIAGNOSIS — Y939 Activity, unspecified: Secondary | ICD-10-CM | POA: Diagnosis not present

## 2019-07-27 DIAGNOSIS — Y999 Unspecified external cause status: Secondary | ICD-10-CM | POA: Diagnosis not present

## 2019-07-27 DIAGNOSIS — S32020A Wedge compression fracture of second lumbar vertebra, initial encounter for closed fracture: Secondary | ICD-10-CM | POA: Diagnosis not present

## 2019-07-27 DIAGNOSIS — X58XXXA Exposure to other specified factors, initial encounter: Secondary | ICD-10-CM | POA: Diagnosis not present

## 2019-07-27 DIAGNOSIS — Y929 Unspecified place or not applicable: Secondary | ICD-10-CM | POA: Diagnosis not present

## 2019-08-04 ENCOUNTER — Telehealth: Payer: Self-pay

## 2019-08-05 MED ORDER — HYDROCODONE-ACETAMINOPHEN 5-325 MG PO TABS: ORAL_TABLET | ORAL | 0 refills | Status: DC

## 2019-08-05 NOTE — Telephone Encounter (Signed)
I can refill the medication but they need to make sure to talk to the surgeon about her pain and make sure they are taking medication as prescribed for post-op.

## 2019-08-05 NOTE — Telephone Encounter (Signed)
Spoke with son today and info.

## 2019-08-09 ENCOUNTER — Other Ambulatory Visit: Payer: Self-pay | Admitting: Family

## 2019-08-18 DIAGNOSIS — S32020A Wedge compression fracture of second lumbar vertebra, initial encounter for closed fracture: Secondary | ICD-10-CM | POA: Diagnosis not present

## 2019-08-23 NOTE — Telephone Encounter (Signed)
error 

## 2019-08-24 ENCOUNTER — Other Ambulatory Visit: Payer: Self-pay | Admitting: Family

## 2019-08-29 ENCOUNTER — Other Ambulatory Visit: Payer: Self-pay | Admitting: Family

## 2019-08-30 ENCOUNTER — Other Ambulatory Visit: Payer: Self-pay | Admitting: Family

## 2019-09-07 ENCOUNTER — Other Ambulatory Visit: Payer: Self-pay | Admitting: Family

## 2019-09-15 ENCOUNTER — Other Ambulatory Visit: Payer: Self-pay | Admitting: Family

## 2019-10-11 ENCOUNTER — Telehealth: Payer: Self-pay | Admitting: *Deleted

## 2019-10-11 NOTE — Telephone Encounter (Signed)
CALLED PATIENT TO INFORM OF STAT LAB ON 10-14-19 - 1:45 PM @ [place] AND HER CT ON 10-14-19 - ARRIVAL TIME- 2:45 PM @ [facility], PT. TO HAVE WATER ONLY - 4 HRS. PRIOR TO TEST, PATIENT TO RECEIVE RESULTS FROM ALISON PERKINS ON 10-24-19 @ 3 PM VIA TELEPHONE, SPOKE WITH PATIENT'S SON - STEVE STRICKLAND AND HE IS AWARE OF THESE APPTS.

## 2019-10-14 ENCOUNTER — Other Ambulatory Visit: Payer: Self-pay

## 2019-10-14 ENCOUNTER — Ambulatory Visit (HOSPITAL_COMMUNITY)
Admission: RE | Admit: 2019-10-14 | Discharge: 2019-10-14 | Disposition: A | Discharge status: Home / Self Care | Payer: Medicare Other | Source: Ambulatory Visit | Attending: Radiation Oncology | Admitting: Radiation Oncology

## 2019-10-14 ENCOUNTER — Ambulatory Visit
Admission: RE | Admit: 2019-10-14 | Discharge: 2019-10-14 | Disposition: A | Discharge status: Home / Self Care | Payer: Medicare Other | Source: Ambulatory Visit | Attending: Radiation Oncology | Admitting: Radiation Oncology

## 2019-10-14 ENCOUNTER — Other Ambulatory Visit: Payer: Self-pay | Admitting: Family

## 2019-10-14 ENCOUNTER — Telehealth: Payer: Self-pay

## 2019-10-14 DIAGNOSIS — C3431 Malignant neoplasm of lower lobe, right bronchus or lung: Secondary | ICD-10-CM | POA: Diagnosis not present

## 2019-10-14 LAB — BUN & CREATININE (CHCC)
BUN: 12 mg/dL (ref 8–23)
Creatinine: 0.83 mg/dL (ref 0.44–1.00)
GFR, Est AFR Am: >60 mL/min (ref >60)
GFR, Estimated: >60 mL/min (ref >60)

## 2019-10-14 MED ORDER — IOHEXOL 300 MG/ML  SOLN
75.0000 mL | Freq: Once | INTRAMUSCULAR | Status: AC | PRN
  Administered 2019-10-14: 15:00:00 75 mL via INTRAVENOUS

## 2019-10-14 MED ORDER — SODIUM CHLORIDE (PF) 0.9 % IJ SOLN
INTRAMUSCULAR | Status: AC
  Filled 2019-10-14: qty 50

## 2019-10-14 MED ORDER — TRAMADOL HCL 50 MG PO TABS: ORAL_TABLET | ORAL | 0 refills | Status: DC

## 2019-10-17 ENCOUNTER — Telehealth: Payer: Self-pay | Admitting: *Deleted

## 2019-10-17 NOTE — Telephone Encounter (Signed)
CALLED PATIENT TO ASK IF SHE WOULD BE WILLING TO HAVE ALISON PERKINS CALL HER ON THE PHONE TOMORROW TO GIVE HER SCANS RESULTS @ 10:30 AM, PATIENT AGREED TO TAKE PHONE CALL ON 10-18-19 @ 10:30 AM

## 2019-10-18 ENCOUNTER — Other Ambulatory Visit: Payer: Self-pay

## 2019-10-18 ENCOUNTER — Ambulatory Visit
Admission: RE | Admit: 2019-10-18 | Discharge: 2019-10-18 | Disposition: A | Discharge status: Home / Self Care | Payer: Medicare Other | Source: Ambulatory Visit | Attending: Radiation Oncology | Admitting: Radiation Oncology

## 2019-10-18 ENCOUNTER — Telehealth: Payer: Self-pay | Admitting: Family

## 2019-10-18 ENCOUNTER — Encounter: Payer: Self-pay | Admitting: Radiation Oncology

## 2019-10-18 DIAGNOSIS — Z8709 Personal history of other diseases of the respiratory system: Secondary | ICD-10-CM | POA: Diagnosis not present

## 2019-10-18 DIAGNOSIS — Z08 Encounter for follow-up examination after completed treatment for malignant neoplasm: Secondary | ICD-10-CM | POA: Diagnosis not present

## 2019-10-18 DIAGNOSIS — J449 Chronic obstructive pulmonary disease, unspecified: Secondary | ICD-10-CM | POA: Diagnosis not present

## 2019-10-18 DIAGNOSIS — Z87891 Personal history of nicotine dependence: Secondary | ICD-10-CM | POA: Diagnosis not present

## 2019-10-18 DIAGNOSIS — C3431 Malignant neoplasm of lower lobe, right bronchus or lung: Secondary | ICD-10-CM

## 2019-10-18 NOTE — Progress Notes (Addendum)
Radiation Oncology         [phone] ________________________________  Outpatient Follow Up - Conducted via telephone due to current COVID-19 concerns for limiting patient exposure  I spoke with the patient to conduct this consult visit via telephone to spare the patient unnecessary potential exposure in the healthcare setting during the current COVID-19 pandemic. The patient was notified in advance and was offered a [place] meeting to allow for face to face communication but unfortunately reported that they did not have the appropriate resources/technology to support such a visit and instead preferred to proceed with a telephone visit.   Name: Heather [name]        MRN: [id number]  Date of Service: 10/18/2019 DOB: 1935/08/12  WT:UUEKCM, Marvis Repress, FNP  Marrian Salvage,*     REFERRING PHYSICIAN: Marrian Salvage,*   DIAGNOSIS: The encounter diagnosis was Malignant neoplasm of lower lobe of right lung ([place]).   HISTORY OF PRESENT ILLNESS: Heather [name] is a 84 y.o. female with a history of putative Stage IA2, cT1bN0M0, NSCLC of the right lower lobe. The paitent has a history of COPD controlled on 3 inhalers and who had a CT in February 2019 that revealed a 1 cm mass in the right lower lobe.  This has been followed on subsequent scans as it was originally felt to be mucus plugging, however on 06/11/2018 this was seen again measuring 1.1 x 1.1 cm.  No evidence of adenopathy was identified.  She was counseled on the rationale to proceed with pet imaging which she underwent on 06/28/2018.  The SUV of the lesion was 2.77, with a blood pool of 2.23.  The lesion did measure 12 mm.  No additional evidence of adenopathy was identified.  She was counseled on repeat imaging and underwent this on 10/18/2018.  The lesion now measures 15 x 13 mm in comparison to her prior imaging.  No adenopathy is identified.  She is against moving forward with aggressive work-up, and would not be a  good surgical candidate.  Proceeded with stereotactic body radiotherapy (SBRT) which she completed in January of this year. She has been NED since and her last CT of the chest on 10/14/2019 revealed postradiotherapy fibrotic changes that were slightly smaller than in July 2020. She does have some compression deformity seen at L2, as well as atherosclerotic disease in the aorta and emphysema.   PREVIOUS RADIATION THERAPY:   12/15/18-12/22/2018 SBRT Treatment: The RLL Target was treated to 54 Gy in 3 fractions  PAST MEDICAL HISTORY:  Past Medical History:  Diagnosis Date   ANXIETY 07/23/2009   Atherosclerosis of aorta ([place]) 0/34/9179   Complication of anesthesia 7 or 8 yrs ago   woke up during colonscopy   COPD (chronic obstructive pulmonary disease) ([place])    Coronary atherosclerosis 07/24/2015   DEPRESSION 07/23/2009   DIVERTICULOSIS, COLON 07/23/2009   GERD 07/23/2009   Headache(784.0) 07/23/2009   occasional   Hemorrhoids    HIP PAIN, LEFT 05/16/2010   History of Crohn's disease    HYPERLIPIDEMIA 07/23/2009   HYPERTENSION 07/23/2009   HYPOTHYROIDISM 07/23/2009   IBS (irritable bowel syndrome)    Insomnia    LOW BACK PAIN, CHRONIC 10/01/2009   PARESTHESIA 10/01/2009   Shingles 2006   back       PAST SURGICAL HISTORY: Past Surgical History:  Procedure Laterality Date   ABDOMINAL HYSTERECTOMY  age 35 or 41   APPENDECTOMY  yrs ago   BILATERAL SALPINGOOPHORECTOMY  age 77 or 42  COLONOSCOPY N/A 11/17/2013   Procedure: COLONOSCOPY;  Surgeon: Cleotis Nipper, MD;  Location: [facility];  Service: Endoscopy;  Laterality: N/A;   ESOPHAGOGASTRODUODENOSCOPY N/A 11/17/2013   Procedure: ESOPHAGOGASTRODUODENOSCOPY (EGD);  Surgeon: Cleotis Nipper, MD;  Location: [facility];  Service: Endoscopy;  Laterality: N/A;   HEMORRHOID SURGERY  yrs ago   TONSILLECTOMY  yrs ago     FAMILY HISTORY:  Family History  Problem Relation Age of Onset   Cancer Mother         Unknown type   Cancer Sister        Breast   Clotting disorder Neg Hx      SOCIAL HISTORY:  reports that she quit smoking about 21 years ago. Her smoking use included cigarettes. She started smoking about 59 years ago. She smoked 0.50 packs per day. She has never used smokeless tobacco. She reports that she does not drink alcohol or use drugs. The patient is widowed and lives in [place]. Her son Remo Lipps is an active participant in her medical care.   ALLERGIES: Tylenol [acetaminophen] and Symbicort [budesonide-formoterol fumarate]   MEDICATIONS:  Current Outpatient Medications  Medication Sig Dispense Refill   Albuterol Sulfate (PROAIR RESPICLICK) 793 (90 Base) MCG/ACT AEPB Inhale 2 puffs into the lungs 4 (four) times daily as needed. (Patient taking differently: Inhale 2 puffs into the lungs 4 (four) times daily as needed (sob and wheezing). ) 1 each 6   benzonatate (TESSALON) 100 MG capsule TAKE 1 CAPSULE TWICE DAILY AS NEEDED FOR COUGH. (Patient taking differently: Take 100 mg by mouth 2 (two) times daily as needed for cough. ) 20 capsule 0   BREO ELLIPTA 200-25 MCG/INH AEPB INHALE 1 PUFF INTO THE LUNGS ONCE DAILY. 60 each 3   clorazepate (TRANXENE) 15 MG tablet TAKE (1) TABLET TWICE DAILY AS NEEDED FOR ANXIETY. 60 tablet 0   diltiazem (CARDIZEM CD) 240 MG 24 hr capsule TAKE (1) CAPSULE DAILY. 90 capsule 0   esomeprazole (NEXIUM) 40 MG capsule Take 1 capsule (40 mg total) by mouth daily. 90 capsule 3   folic acid (FOLVITE) 1 MG tablet TAKE 1 TABLET ONCE DAILY. 90 tablet 0   furosemide (LASIX) 20 MG tablet TAKE 1 TABLET DAILY IF NEEDED FOR SIGNIFICANT LOWER EXTREMITY SWELLING. 90 tablet 0   HYDROcodone-acetaminophen (NORCO/VICODIN) 5-325 MG tablet Take 1 tablet every 4-6 hours prn for pain 60 tablet 0   levothyroxine (SYNTHROID) 25 MCG tablet TAKE 1 TABLET DAILY BEFORE BREAKFAST. 90 tablet 3   Multiple Vitamin (MULTIVITAMIN WITH MINERALS) TABS tablet Take 1 tablet by mouth  daily. 30 tablet 1   MYRBETRIQ 25 MG TB24 tablet TAKE 1 TABLET ONCE. 90 tablet 0   nitrofurantoin, macrocrystal-monohydrate, (MACROBID) 100 MG capsule Take 1 capsule (100 mg total) by mouth 2 (two) times daily. 14 capsule 0   Polyethyl Glycol-Propyl Glycol (SYSTANE OP) Place 1 drop into both eyes 2 (two) times daily as needed (for dry eyes).      potassium chloride SA (K-DUR) 20 MEQ tablet Take 1 tablet (20 mEq total) by mouth daily. 5 tablet 0   promethazine (PHENERGAN) 25 MG tablet TAKE 1 TABLET EVERY 8 HOURS AS NEEDED FOR NAUSEA & VOMITING. 30 tablet 0   senna-docusate (SENOKOT-S) 8.6-50 MG tablet Take 2 tablets by mouth at bedtime. 60 tablet 2   sertraline (ZOLOFT) 50 MG tablet TAKE 1 TABLET EACH DAY. 90 tablet 0   traMADol (ULTRAM) 50 MG tablet Take 1-2 tablets every 4-6 hours prn pain 120 tablet  0   traZODone (DESYREL) 50 MG tablet TAKE 1/2 TO 1 TABLET AT BEDTIME AS NEEDED FOR REST. 90 tablet 0   zolpidem (AMBIEN) 5 MG tablet TAKE 1 TABLET AT BEDTIME AS NEEDED. 30 tablet 0   No current facility-administered medications for this visit.      REVIEW OF SYSTEMS: On review of systems, the patient reports that she is doing well overall. She denies any chest pain, shortness of breath, cough, fevers, chills, night sweats, unintended weight changes. She denies any bowel or bladder disturbances, and denies abdominal pain, nausea or vomiting. She continues to have anxiety and low back pain. She reports she takes tramadol for this but has not had any procedures performed. She denies any new musculoskeletal or joint aches or pains, new skin lesions or concerns. A complete review of systems is obtained and is otherwise negative.     PHYSICAL EXAM:  Unable to assess due to encounter type.  ECOG = 0  0 - Asymptomatic (Fully active, able to carry on all predisease activities without restriction)  1 - Symptomatic but completely ambulatory (Restricted in physically strenuous activity but  ambulatory and able to carry out work of a light or sedentary nature. For example, light housework, office work)  2 - Symptomatic, <50% in bed during the day (Ambulatory and capable of all self care but unable to carry out any work activities. Up and about more than 50% of waking hours)  3 - Symptomatic, >50% in bed, but not bedbound (Capable of only limited self-care, confined to bed or chair 50% or more of waking hours)  4 - Bedbound (Completely disabled. Cannot carry on any self-care. Totally confined to bed or chair)  5 - Death   Eustace Pen MM, Creech RH, Tormey DC, et al. [phone]). "Toxicity and response criteria of the [facility]". [place] Oncol. 5 (6): 649-55    LABORATORY DATA:  Lab Results  Component Value Date   WBC 6.6 06/21/2019   HGB 10.6 (L) 06/21/2019   HCT 33.4 (L) 06/21/2019   MCV 83.6 06/21/2019   PLT 349.0 06/21/2019   Lab Results  Component Value Date   NA 140 06/21/2019   K 3.1 (L) 06/21/2019   CL 103 06/21/2019   CO2 28 06/21/2019   Lab Results  Component Value Date   ALT 6 06/21/2019   AST 10 06/21/2019   ALKPHOS 74 06/21/2019   BILITOT 0.3 06/21/2019      RADIOGRAPHY: Ct Chest W Contrast  Result Date: 10/14/2019 CLINICAL DATA:  Putative stage IA right lower lobe lung cancer treated with radiation therapy completed 12/22/2018. Restaging. EXAM: CT CHEST WITH CONTRAST TECHNIQUE: Multidetector CT imaging of the chest was performed during intravenous contrast administration. CONTRAST:  88m OMNIPAQUE IOHEXOL 300 MG/ML  SOLN COMPARISON:  07/04/2019 chest CT. FINDINGS: Cardiovascular: Normal heart size. No significant pericardial effusion/thickening. Left anterior descending coronary atherosclerosis. Atherosclerotic nonaneurysmal thoracic aorta. Normal caliber pulmonary arteries. No central pulmonary emboli. Mediastinum/Nodes: No discrete thyroid nodules. Unremarkable esophagus. No pathologically enlarged axillary, mediastinal or hilar  lymph nodes. Lungs/Pleura: No pneumothorax. No pleural effusion. Severe centrilobular emphysema. Masslike fibrosis in the right lower lobe measures 4.1 x 3.4 cm (series 5/image 105) with surrounding sharply marginated bandlike consolidation and associated volume loss and distortion, previously 4.8 x 3.4 cm, compatible with evolving postradiation change. No acute consolidative airspace disease or new significant pulmonary nodules. Upper abdomen: No acute abnormality. Musculoskeletal: No aggressive appearing focal osseous lesions. Partially visualized vertebroplasty change within L2  vertebral compression fracture. IMPRESSION: 1. Expected evolution of masslike fibrosis due to radiation change in the right lower lobe. No findings to suggest local tumor recurrence at this time. Continued chest CT surveillance warranted. 2. No findings of metastatic disease in the chest. Aortic Atherosclerosis (ICD10-I70.0) and Emphysema (ICD10-J43.9). Electronically Signed   By: Ilona Sorrel M.D.   On: 10/14/2019 16:24       IMPRESSION/PLAN: 1. Putative Stage IA2, cT1bN0M0, NSCLC of the right lower lobe.The patient continues to be NED since treatment. Her postraditoherapy fibrosis appears stable to slightly improved and no new disease is noted. She was counseled on the rationale to proceed with repeat imaging of the chest in 6 months time. We will coordinate this with her.  2. COPD. The patient reports she is still doing well with her inhaled medications. She will follow up with pulmonary medicine as well. 3. Atherosclerotic changes on CT. This is followed by her PCP. 4. L2 compression fracture. The patient does not recall having met with neurosurgery. I will follow up with her PCP to see if there are any plans for this as the patient continues to have pain from this site. 5. HIPPA request. The patient's daughter in law was part of a meaningful use call with the nursing staff this am. I'm not sure the logistics of how this came  about but when I spoke with the patient she request that we only share medical information about her case with her son and not any other family members. I let her know that I'd be happy to speak with her son [name] and he and I have communicated previously. She is in agreement to share the information we discussed today with him and let him know to call if he has questions or concerns.    Given current concerns for patient exposure during the COVID-19 pandemic, this encounter was conducted via telephone.  The patient has given verbal consent for this type of encounter. The time spent during this encounter was 25 minutes and 50% of that time was spent in the coordination of her care. The attendants for this meeting include Shona Simpson, [facility] and [name]  During the encounter, Shona Simpson [facility] was located at [facility] Radiation Oncology Department.  Heather [name]  was located at home.     Carola Rhine, PAC

## 2019-10-18 NOTE — Telephone Encounter (Signed)
Please let her and her son know that they need to contact the neurosurgeon about continued pain/ problems at the area of the compression fracture. If they have been released by neurosurgery and pain is continuing to be an issue, we will need to have her meet with pain management.

## 2019-10-19 ENCOUNTER — Other Ambulatory Visit: Payer: Self-pay | Admitting: Family

## 2019-10-19 DIAGNOSIS — G8929 Other chronic pain: Secondary | ICD-10-CM

## 2019-10-19 DIAGNOSIS — M4856XS Collapsed vertebra, not elsewhere classified, lumbar region, sequela of fracture: Secondary | ICD-10-CM

## 2019-10-19 DIAGNOSIS — M545 Low back pain, unspecified: Secondary | ICD-10-CM

## 2019-10-24 ENCOUNTER — Ambulatory Visit: Payer: Self-pay | Admitting: Radiation Oncology

## 2019-10-31 ENCOUNTER — Emergency Department (HOSPITAL_COMMUNITY): Payer: Medicare Other

## 2019-10-31 ENCOUNTER — Emergency Department (HOSPITAL_COMMUNITY)
Admission: EM | Admit: 2019-10-31 | Discharge: 2019-11-01 | Disposition: A | Discharge status: Home / Self Care | Payer: Medicare Other | Attending: Emergency Medicine | Admitting: Emergency Medicine

## 2019-10-31 ENCOUNTER — Other Ambulatory Visit: Payer: Self-pay

## 2019-10-31 ENCOUNTER — Encounter (HOSPITAL_COMMUNITY): Payer: Self-pay | Admitting: Emergency Medicine

## 2019-10-31 DIAGNOSIS — Z79899 Other long term (current) drug therapy: Secondary | ICD-10-CM | POA: Insufficient documentation

## 2019-10-31 DIAGNOSIS — W1830XA Fall on same level, unspecified, initial encounter: Secondary | ICD-10-CM | POA: Insufficient documentation

## 2019-10-31 DIAGNOSIS — Z20828 Contact with and (suspected) exposure to other viral communicable diseases: Secondary | ICD-10-CM | POA: Insufficient documentation

## 2019-10-31 DIAGNOSIS — J449 Chronic obstructive pulmonary disease, unspecified: Secondary | ICD-10-CM | POA: Insufficient documentation

## 2019-10-31 DIAGNOSIS — M542 Cervicalgia: Secondary | ICD-10-CM | POA: Diagnosis not present

## 2019-10-31 DIAGNOSIS — E039 Hypothyroidism, unspecified: Secondary | ICD-10-CM | POA: Insufficient documentation

## 2019-10-31 DIAGNOSIS — M5489 Other dorsalgia: Secondary | ICD-10-CM | POA: Diagnosis not present

## 2019-10-31 DIAGNOSIS — S199XXA Unspecified injury of neck, initial encounter: Secondary | ICD-10-CM | POA: Diagnosis not present

## 2019-10-31 DIAGNOSIS — Z043 Encounter for examination and observation following other accident: Secondary | ICD-10-CM | POA: Diagnosis present

## 2019-10-31 DIAGNOSIS — K5730 Diverticulosis of large intestine without perforation or abscess without bleeding: Secondary | ICD-10-CM | POA: Diagnosis not present

## 2019-10-31 DIAGNOSIS — Y92009 Unspecified place in unspecified non-institutional (private) residence as the place of occurrence of the external cause: Secondary | ICD-10-CM

## 2019-10-31 DIAGNOSIS — W19XXXA Unspecified fall, initial encounter: Secondary | ICD-10-CM

## 2019-10-31 DIAGNOSIS — Z87891 Personal history of nicotine dependence: Secondary | ICD-10-CM | POA: Diagnosis not present

## 2019-10-31 DIAGNOSIS — R52 Pain, unspecified: Secondary | ICD-10-CM | POA: Diagnosis not present

## 2019-10-31 DIAGNOSIS — R531 Weakness: Secondary | ICD-10-CM | POA: Insufficient documentation

## 2019-10-31 DIAGNOSIS — R10815 Periumbilic abdominal tenderness: Secondary | ICD-10-CM | POA: Insufficient documentation

## 2019-10-31 DIAGNOSIS — R410 Disorientation, unspecified: Secondary | ICD-10-CM | POA: Diagnosis not present

## 2019-10-31 DIAGNOSIS — G44309 Post-traumatic headache, unspecified, not intractable: Secondary | ICD-10-CM | POA: Diagnosis not present

## 2019-10-31 DIAGNOSIS — R0902 Hypoxemia: Secondary | ICD-10-CM | POA: Diagnosis not present

## 2019-10-31 DIAGNOSIS — R404 Transient alteration of awareness: Secondary | ICD-10-CM | POA: Diagnosis not present

## 2019-10-31 LAB — URINALYSIS, ROUTINE W REFLEX MICROSCOPIC
Bilirubin Urine: NEGATIVE
Glucose, UA: NEGATIVE mg/dL
Hgb urine dipstick: NEGATIVE
Ketones, ur: 80 mg/dL — AB
Leukocytes,Ua: NEGATIVE
Nitrite: NEGATIVE
Protein, ur: NEGATIVE mg/dL
Specific Gravity, Urine: >1.046 — ABNORMAL HIGH (ref 1.005–1.030)
pH: 6 (ref 5.0–8.0)

## 2019-10-31 LAB — CBC WITH DIFFERENTIAL/PLATELET
Abs Immature Granulocytes: 0.04 10*3/uL (ref 0.00–0.07)
Basophils Absolute: 0 10*3/uL (ref 0.0–0.1)
Basophils Relative: 0 %
Eosinophils Absolute: 0 10*3/uL (ref 0.0–0.5)
Eosinophils Relative: 0 %
HCT: 40.5 % (ref 36.0–46.0)
Hemoglobin: 12.5 g/dL (ref 12.0–15.0)
Immature Granulocytes: 0 %
Lymphocytes Relative: 12 %
Lymphs Abs: 1.3 10*3/uL (ref 0.7–4.0)
MCH: 26.5 pg (ref 26.0–34.0)
MCHC: 30.9 g/dL (ref 30.0–36.0)
MCV: 85.8 fL (ref 80.0–100.0)
Monocytes Absolute: 0.7 10*3/uL (ref 0.1–1.0)
Monocytes Relative: 6 %
Neutro Abs: 9.1 10*3/uL — ABNORMAL HIGH (ref 1.7–7.7)
Neutrophils Relative %: 82 %
Platelets: 713 10*3/uL — ABNORMAL HIGH (ref 150–400)
RBC: 4.72 MIL/uL (ref 3.87–5.11)
RDW: 15.2 % (ref 11.5–15.5)
WBC: 11.2 10*3/uL — ABNORMAL HIGH (ref 4.0–10.5)
nRBC: 0 % (ref 0.0–0.2)

## 2019-10-31 LAB — BASIC METABOLIC PANEL
Anion gap: 17 — ABNORMAL HIGH (ref 5–15)
BUN: 10 mg/dL (ref 8–23)
CO2: 17 mmol/L — ABNORMAL LOW (ref 22–32)
Calcium: 9.8 mg/dL (ref 8.9–10.3)
Chloride: 108 mmol/L (ref 98–111)
Creatinine, Ser: 0.96 mg/dL (ref 0.44–1.00)
GFR calc Af Amer: >60 mL/min (ref >60)
GFR calc non Af Amer: 55 mL/min — ABNORMAL LOW (ref >60)
Glucose, Bld: 87 mg/dL (ref 70–99)
Potassium: 3.7 mmol/L (ref 3.5–5.1)
Sodium: 142 mmol/L (ref 135–145)

## 2019-10-31 LAB — CK: Total CK: 560 U/L — ABNORMAL HIGH (ref 38–234)

## 2019-10-31 LAB — SARS CORONAVIRUS 2 (TAT 6-24 HRS): SARS Coronavirus 2: NEGATIVE

## 2019-10-31 MED ORDER — MORPHINE SULFATE (PF) 4 MG/ML IV SOLN
4.0000 mg | Freq: Once | INTRAVENOUS | Status: AC
  Administered 2019-10-31: 4 mg via INTRAVENOUS
  Filled 2019-10-31: qty 1

## 2019-10-31 MED ORDER — LACTATED RINGERS IV BOLUS
1000.0000 mL | Freq: Once | INTRAVENOUS | Status: AC
  Administered 2019-10-31: 1000 mL via INTRAVENOUS

## 2019-10-31 MED ORDER — MORPHINE SULFATE (PF) 2 MG/ML IV SOLN
2.0000 mg | Freq: Once | INTRAVENOUS | Status: AC
  Administered 2019-10-31: 2 mg via INTRAVENOUS
  Filled 2019-10-31: qty 1

## 2019-10-31 MED ORDER — IOHEXOL 300 MG/ML  SOLN
100.0000 mL | Freq: Once | INTRAMUSCULAR | Status: AC | PRN
  Administered 2019-10-31: 100 mL via INTRAVENOUS

## 2019-10-31 MED ORDER — LORAZEPAM 2 MG/ML IJ SOLN
0.5000 mg | Freq: Once | INTRAMUSCULAR | Status: AC
  Administered 2019-10-31: 0.5 mg via INTRAVENOUS
  Filled 2019-10-31: qty 1

## 2019-10-31 NOTE — ED Triage Notes (Signed)
Per [facility] pt coming from home where she was found on bathroom floor by her son. Unsure how long patient has been on ground. Patient c/o neck, back, right shoulder and bilateral elbow pain.

## 2019-10-31 NOTE — Telephone Encounter (Signed)
This has been addressed already

## 2019-10-31 NOTE — ED Provider Notes (Signed)
[facility] EMERGENCY DEPARTMENT Provider Note   CSN: [id number] Arrival date & time: 10/31/19  1733     History   Chief Complaint Chief Complaint  Patient presents with   Fall    HPI Heather [name] is a 84 y.o. female.     HPI   84 year old female presenting after fall.  Found on the bathroom floor by her son.  She is complaining of pain "all over."  She is unsure of how/why she fell.  No fevers or chills.  Reports chronically loose stools.  Some increased urinary frequency recently.  No dysuria or hematuria.  No acute respiratory complaints.  She is not anticoagulated.  Past Medical History:  Diagnosis Date   ANXIETY 07/23/2009   Atherosclerosis of aorta ([place]) 9/83/3825   Complication of anesthesia 7 or 8 yrs ago   woke up during colonscopy   COPD (chronic obstructive pulmonary disease) ([place])    Coronary atherosclerosis 07/24/2015   DEPRESSION 07/23/2009   DIVERTICULOSIS, COLON 07/23/2009   GERD 07/23/2009   Headache(784.0) 07/23/2009   occasional   Hemorrhoids    HIP PAIN, LEFT 05/16/2010   History of Crohn's disease    HYPERLIPIDEMIA 07/23/2009   HYPERTENSION 07/23/2009   HYPOTHYROIDISM 07/23/2009   IBS (irritable bowel syndrome)    Insomnia    LOW BACK PAIN, CHRONIC 10/01/2009   PARESTHESIA 10/01/2009   Shingles 2006   back    Patient Active Problem List   Diagnosis Date Noted   Fall 04/22/2019   Elevated CK 04/22/2019   Leukocytosis 04/22/2019   Malignant neoplasm of lower lobe of right lung ([place]) 11/16/2018   Dehydration    Abdominal pain 02/27/2018   Colitis 02/27/2018   SBO (small bowel obstruction) ([place]) 02/27/2018   Hypokalemia 02/19/2018   Oral thrush 01/29/2018   Malnutrition of moderate degree 01/29/2018   COPD with acute exacerbation ([place]) 01/28/2018   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   COPD exacerbation ([place]) 05/29/2016   Hypoxia 05/29/2016   Coronary atherosclerosis 07/24/2015     Atherosclerosis of aorta ([place]) 07/24/2015   Dysphagia 07/24/2015   Acute respiratory failure with hypoxia ([place]) 07/23/2015   Bronchospasm with bronchitis, acute 07/23/2015   HIP PAIN, LEFT 05/16/2010   LOW BACK PAIN, CHRONIC 10/01/2009   PARESTHESIA 10/01/2009   Hypothyroidism 07/23/2009   Dyslipidemia 07/23/2009   Anxiety 07/23/2009   Anxiety and depression 07/23/2009   Essential hypertension 07/23/2009   GERD 07/23/2009   DIVERTICULOSIS, COLON 07/23/2009   HEADACHE 07/23/2009    Past Surgical History:  Procedure Laterality Date   ABDOMINAL HYSTERECTOMY  age 47 or 81   APPENDECTOMY  yrs ago   BILATERAL SALPINGOOPHORECTOMY  age 46 or 82   COLONOSCOPY N/A 11/17/2013   Procedure: COLONOSCOPY;  Surgeon: Cleotis Nipper, MD;  Location: [facility] ENDOSCOPY;  Service: Endoscopy;  Laterality: N/A;   ESOPHAGOGASTRODUODENOSCOPY N/A 11/17/2013   Procedure: ESOPHAGOGASTRODUODENOSCOPY (EGD);  Surgeon: Cleotis Nipper, MD;  Location: [facility];  Service: Endoscopy;  Laterality: N/A;   HEMORRHOID SURGERY  yrs ago   TONSILLECTOMY  yrs ago     OB History   No obstetric history on file.      Home Medications    Prior to Admission medications   Medication Sig Start Date End Date Taking? Authorizing Provider  Albuterol Sulfate (PROAIR RESPICLICK) 053 (90 Base) MCG/ACT AEPB Inhale 2 puffs into the lungs 4 (four) times daily as needed. Patient taking differently: Inhale 2 puffs into the lungs 4 (four) times daily  as needed (sob and wheezing).  01/26/18   [name], MD  benzonatate (TESSALON) 100 MG capsule TAKE 1 CAPSULE TWICE DAILY AS NEEDED FOR COUGH. Patient taking differently: Take 100 mg by mouth 2 (two) times daily as needed for cough.  04/21/18   [name], MD  BREO ELLIPTA 200-25 MCG/INH AEPB INHALE 1 PUFF INTO THE LUNGS ONCE DAILY. 05/09/19   Marrian Salvage, FNP  clorazepate (TRANXENE) 15 MG tablet TAKE (1) TABLET TWICE DAILY AS NEEDED  FOR ANXIETY. 10/14/19   Marrian Salvage, FNP  diltiazem (CARDIZEM CD) 240 MG 24 hr capsule TAKE (1) CAPSULE DAILY. 08/29/19   Marrian Salvage, FNP  esomeprazole (NEXIUM) 40 MG capsule Take 1 capsule (40 mg total) by mouth daily. 11/22/18   Marrian Salvage, FNP  folic acid (FOLVITE) 1 MG tablet TAKE 1 TABLET ONCE DAILY. 08/29/19   Marrian Salvage, FNP  furosemide (LASIX) 20 MG tablet TAKE 1 TABLET DAILY IF NEEDED FOR SIGNIFICANT LOWER EXTREMITY SWELLING. 01/19/19   Marrian Salvage, FNP  HYDROcodone-acetaminophen (NORCO/VICODIN) 5-325 MG tablet Take 1 tablet every 4-6 hours prn for pain Patient not taking: Reported on 10/18/2019 08/05/19   Marrian Salvage, FNP  levothyroxine (SYNTHROID) 25 MCG tablet TAKE 1 TABLET DAILY BEFORE BREAKFAST. 05/25/19   Marrian Salvage, FNP  Multiple Vitamin (MULTIVITAMIN WITH MINERALS) TABS tablet Take 1 tablet by mouth daily. Patient not taking: Reported on 10/18/2019 03/14/18   Roxan Hockey, MD  MYRBETRIQ 25 MG TB24 tablet TAKE 1 TABLET ONCE. 08/30/19   Marrian Salvage, FNP  nitrofurantoin, macrocrystal-monohydrate, (MACROBID) 100 MG capsule Take 1 capsule (100 mg total) by mouth 2 (two) times daily. 06/14/19   Marrian Salvage, FNP  Polyethyl Glycol-Propyl Glycol (SYSTANE OP) Place 1 drop into both eyes 2 (two) times daily as needed (for dry eyes).     [provider]  potassium chloride SA (K-DUR) 20 MEQ tablet Take 1 tablet (20 mEq total) by mouth daily. Patient not taking: Reported on 10/18/2019 06/22/19   Marrian Salvage, FNP  promethazine (PHENERGAN) 25 MG tablet TAKE 1 TABLET EVERY 8 HOURS AS NEEDED FOR NAUSEA & VOMITING. 09/07/19   Marrian Salvage, FNP  senna-docusate (SENOKOT-S) 8.6-50 MG tablet Take 2 tablets by mouth at bedtime. 03/13/18   Roxan Hockey, MD  sertraline (ZOLOFT) 50 MG tablet TAKE 1 TABLET EACH DAY. 08/29/19   Marrian Salvage, FNP  traMADol Veatrice [place]) 50 MG tablet  Take 1-2 tablets every 4-6 hours prn pain 10/14/19   Marrian Salvage, FNP  traZODone (DESYREL) 50 MG tablet TAKE 1/2 TO 1 TABLET AT BEDTIME AS NEEDED FOR REST. 08/29/19   Marrian Salvage, FNP  zolpidem (AMBIEN) 5 MG tablet TAKE 1 TABLET AT BEDTIME AS NEEDED. 07/18/19   Marrian Salvage, FNP    Family History Family History  Problem Relation Age of Onset   Cancer Mother        Unknown type   Cancer Sister        Breast   Clotting disorder Neg Hx     Social History Social History   Tobacco Use   Smoking status: Former Smoker    Packs/day: 0.50    Types: Cigarettes    Start date: 07/16/1960    Quit date: 07/16/1998    Years since quitting: 21.3   Smokeless tobacco: Never Used  Substance Use Topics   Alcohol use: No    Alcohol/week: 0.0 standard drinks   Drug use:  No     Allergies   Tylenol [acetaminophen] and Symbicort [budesonide-formoterol fumarate]   Review of Systems Review of Systems  All systems reviewed and negative, other than as noted in HPI.  Physical Exam Updated Vital Signs BP (!) 171/65    Pulse 85    Temp (!) 97.5 F (36.4 C) (Oral)    Resp 12    Ht 4\' 11"  (1.499 m)    Wt 46.3 kg    SpO2 97%    BMI 20.60 kg/m   Physical Exam Vitals signs and nursing note reviewed.  Constitutional:      General: She is not in acute distress.    Appearance: She is well-developed.  HENT:     Head: Normocephalic and atraumatic.  Eyes:     General:        Right eye: No discharge.        Left eye: No discharge.     Conjunctiva/sclera: Conjunctivae normal.  Neck:     Musculoskeletal: Neck supple.  Cardiovascular:     Rate and Rhythm: Normal rate and regular rhythm.     Heart sounds: Normal heart sounds. No murmur. No friction rub. No gallop.   Pulmonary:     Effort: Pulmonary effort is normal. No respiratory distress.     Breath sounds: Normal breath sounds.  Abdominal:     General: There is no distension.     Palpations: Abdomen is soft.       Tenderness: There is abdominal tenderness.     Comments: Mild suprapubic tenderness without rebound or guarding.  No distention.  Musculoskeletal:        General: No tenderness.     Comments: No midline spinal tenderness.  Skin:    General: Skin is warm and dry.  Neurological:     Mental Status: She is alert.  Psychiatric:        Behavior: Behavior normal.        Thought Content: Thought content normal.      ED Treatments / Results  Labs (all labs ordered are listed, but only abnormal results are displayed) Labs Reviewed  CBC WITH DIFFERENTIAL/PLATELET - Abnormal; Notable for the following components:      Result Value   WBC 11.2 (*)    Platelets 713 (*)    Neutro Abs 9.1 (*)    All other components within normal limits  BASIC METABOLIC PANEL - Abnormal; Notable for the following components:   CO2 17 (*)    GFR calc non Af Amer 55 (*)    Anion gap 17 (*)    All other components within normal limits  CK - Abnormal; Notable for the following components:   Total CK 560 (*)    All other components within normal limits  URINALYSIS, ROUTINE W REFLEX MICROSCOPIC - Abnormal; Notable for the following components:   Specific Gravity, Urine >1.046 (*)    Ketones, ur 80 (*)    All other components within normal limits  URINE CULTURE  SARS CORONAVIRUS 2 (TAT 6-24 HRS)    EKG None  Radiology Ct Head Wo Contrast  Result Date: 10/31/2019 CLINICAL DATA:  Posttraumatic headache secondary to a fall at home. Neck pain. Back pain. Patient was found down. EXAM: CT HEAD WITHOUT CONTRAST CT CERVICAL SPINE WITHOUT CONTRAST TECHNIQUE: Multidetector CT imaging of the head and cervical spine was performed following the standard protocol without intravenous contrast. Multiplanar CT image reconstructions of the cervical spine were also generated. COMPARISON:  CT scan dated  04/22/2019 FINDINGS: CT HEAD FINDINGS Brain: No evidence of acute infarction, hemorrhage, hydrocephalus, extra-axial  collection or mass lesion/mass effect. Diffuse mild atrophy consistent with age. Vascular: No hyperdense vessel or unexpected calcification. Skull: Normal. Negative for fracture or focal lesion. Sinuses/Orbits: Normal. Other: None CT CERVICAL SPINE FINDINGS Alignment: 1.5 mm anterolisthesis of C7 on T1 due to moderate bilateral facet arthritis, right greater than left. Skull base and vertebrae: No acute fracture. No primary bone lesion or focal pathologic process. Soft tissues and spinal canal: No prevertebral fluid or swelling. No visible canal hematoma. Disc levels: There are no significant disc protrusions. There is disc space narrowing from C3-4 through C6-7, most severe at C5-6 and C6-7. No significant foraminal stenosis. Small broad-based disc osteophyte complexes at C5-6 and C6-7. Upper chest: Emphysema in both lung apices. Other: None IMPRESSION: 1. No acute intracranial abnormality. Mild diffuse atrophy. 2. No acute abnormality of the cervical spine. Multilevel degenerative disc and joint disease. Electronically Signed   By: Lorriane Shire M.D.   On: 10/31/2019 20:49   Ct Cervical Spine Wo Contrast  Result Date: 10/31/2019 CLINICAL DATA:  Posttraumatic headache secondary to a fall at home. Neck pain. Back pain. Patient was found down. EXAM: CT HEAD WITHOUT CONTRAST CT CERVICAL SPINE WITHOUT CONTRAST TECHNIQUE: Multidetector CT imaging of the head and cervical spine was performed following the standard protocol without intravenous contrast. Multiplanar CT image reconstructions of the cervical spine were also generated. COMPARISON:  CT scan dated 04/22/2019 FINDINGS: CT HEAD FINDINGS Brain: No evidence of acute infarction, hemorrhage, hydrocephalus, extra-axial collection or mass lesion/mass effect. Diffuse mild atrophy consistent with age. Vascular: No hyperdense vessel or unexpected calcification. Skull: Normal. Negative for fracture or focal lesion. Sinuses/Orbits: Normal. Other: None CT CERVICAL  SPINE FINDINGS Alignment: 1.5 mm anterolisthesis of C7 on T1 due to moderate bilateral facet arthritis, right greater than left. Skull base and vertebrae: No acute fracture. No primary bone lesion or focal pathologic process. Soft tissues and spinal canal: No prevertebral fluid or swelling. No visible canal hematoma. Disc levels: There are no significant disc protrusions. There is disc space narrowing from C3-4 through C6-7, most severe at C5-6 and C6-7. No significant foraminal stenosis. Small broad-based disc osteophyte complexes at C5-6 and C6-7. Upper chest: Emphysema in both lung apices. Other: None IMPRESSION: 1. No acute intracranial abnormality. Mild diffuse atrophy. 2. No acute abnormality of the cervical spine. Multilevel degenerative disc and joint disease. Electronically Signed   By: Lorriane Shire M.D.   On: 10/31/2019 20:49   Ct Abdomen Pelvis W Contrast  Result Date: 10/31/2019 CLINICAL DATA:  Acute abdominal pain. Patient was found down EXAM: CT ABDOMEN AND PELVIS WITH CONTRAST TECHNIQUE: Multidetector CT imaging of the abdomen and pelvis was performed using the standard protocol following bolus administration of intravenous contrast. 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 Heart size is normal. No pericardial effusion. Hepatobiliary: No focal liver abnormality is seen. No gallstones, gallbladder wall thickening, or biliary dilatation. Pancreas: Unremarkable. No pancreatic ductal dilatation or surrounding inflammatory changes. Spleen: Normal in size without focal abnormality. Adrenals/Urinary Tract: Normal adrenal glands. 5 mm cyst on the anterior aspect of the mid left kidney. Kidneys are otherwise normal. No hydronephrosis. Bladder is normal. Stomach/Bowel: Extensive diverticulosis of the colon, most numerous in the sigmoid region. No  diverticulitis. The bowel is otherwise normal. Appendix has been removed. Vascular/Lymphatic: Aortic  atherosclerosis. No enlarged abdominal or pelvic lymph nodes. Reproductive: Status post hysterectomy. No adnexal masses. Other: No abdominal wall hernia or abnormality. No abdominopelvic ascites. Musculoskeletal: No acute abnormality. Old compression fracture of L2 treated with vertebroplasty. IMPRESSION: 1. No acute abnormalities of the abdomen or pelvis. 2. Extensive diverticulosis of the colon. 3. Aortic Atherosclerosis (ICD10-I70.0). Electronically Signed   By: Lorriane Shire M.D.   On: 10/31/2019 21:01    Procedures Procedures (including critical care time)  Medications Ordered in ED Medications  lactated ringers bolus 1,000 mL (1,000 mLs Intravenous New Bag/Given 10/31/19 1857)  morphine 4 MG/ML injection 4 mg (4 mg Intravenous Given 10/31/19 1857)  iohexol (OMNIPAQUE) 300 MG/ML solution 100 mL (100 mLs Intravenous Contrast Given 10/31/19 2028)    Initial Impression / Assessment and Plan / ED Course  I have reviewed the triage vital signs and the nursing notes.  Pertinent labs & imaging results that were available during my care of the patient were reviewed by me and considered in my medical decision making (see chart for details).  84 year old female presented for fall.  Planing of "pain all over."  Exam fairly unimpressive aside from some mild suprapubic tenderness.  No midline spinal tenderness.  Neuro exam nonfocal.  Reassuring imaging and labs.  Hemodynamically stable.  Final Clinical Impressions(s) / ED Diagnoses   Final diagnoses:  Fall in home, initial encounter  Generalized weakness    ED Discharge Orders    None       Virgel Manifold, MD 11/02/19 1547

## 2019-11-01 ENCOUNTER — Other Ambulatory Visit: Payer: Self-pay | Admitting: Family

## 2019-11-01 ENCOUNTER — Telehealth: Payer: Self-pay

## 2019-11-01 LAB — URINE CULTURE: Culture: NO GROWTH

## 2019-11-01 NOTE — ED Notes (Signed)
Discharge instructions discussed with pt and son. Pt verbalized understanding. Pt stable and ambulatory. No signature pad available

## 2019-11-01 NOTE — Telephone Encounter (Signed)
Spoke with patient's son this morning. States his mother fell yesterday but was discharged home. He is requesting to have a home-health order put into place to help assist her with ADL since she is now home by herself. He said he mainly wanted to have someone help her with bathing, light cleaning, ec.  Please advise.

## 2019-11-02 ENCOUNTER — Other Ambulatory Visit: Payer: Self-pay

## 2019-11-02 NOTE — Telephone Encounter (Signed)
Spoke with patient's son and info given.

## 2019-11-02 NOTE — Telephone Encounter (Signed)
Please let them know that home health is not going to be able to provide the services he is wanting. It sounds like he needs to look into a private sitter for his mom which Medicare is not going to pay for. If she is living by herself again, they should also get some type of "Life Alert" system that she can easily push a button to notify someone if she has fallen.

## 2019-11-10 ENCOUNTER — Other Ambulatory Visit: Payer: Self-pay

## 2019-11-10 ENCOUNTER — Encounter
Payer: Medicare Other | Attending: Physical Medicine and Rehabilitation | Admitting: Physical Medicine and Rehabilitation

## 2019-11-10 ENCOUNTER — Encounter: Payer: Self-pay | Admitting: Physical Medicine and Rehabilitation

## 2019-11-10 VITALS — BP 113/64 | HR 61 | Ht 59.0 in | Wt 102.0 lb

## 2019-11-10 DIAGNOSIS — M48062 Spinal stenosis, lumbar region with neurogenic claudication: Secondary | ICD-10-CM | POA: Diagnosis not present

## 2019-11-10 DIAGNOSIS — M797 Fibromyalgia: Secondary | ICD-10-CM | POA: Insufficient documentation

## 2019-11-10 MED ORDER — TRAZODONE HCL 100 MG PO TABS: 100.0000 mg | ORAL_TABLET | Freq: Every evening | ORAL | 0 refills | Status: DC | PRN

## 2019-11-10 MED ORDER — PREGABALIN 25 MG PO CAPS: 25.0000 mg | ORAL_CAPSULE | Freq: Every day | ORAL | 0 refills | Status: DC

## 2019-11-10 MED ORDER — TRAMADOL HCL 50 MG PO TABS: 50.0000 mg | ORAL_TABLET | Freq: Four times a day (QID) | ORAL | 0 refills | Status: DC | PRN

## 2019-11-10 NOTE — Progress Notes (Signed)
Subjective:    Patient ID: Heather [name], female    DOB: [birth year], 84 y.o.   MRN: [id number]  HPI  84 year old woman presenting with diffuse body pain in her bilateral shoulder, knees, hips, ankles, and lower back. She also reports shooting pain down both legs. She suffered a nontraumatic L5 compression fracture and as per her son, has had several falls at home. She lives alone and has a fall safety device. Her son visits once per week and helps with chores, but they rarely leave the house.   She has done physical therapy and remembers her exercises well and tries to perform them regularly. For pain she takes 1-2 50mg  tablets of Tramadol every 6 hours.  She has severe insomnia and has only been sleeping 3 hours per night for a long time. She says she needs to watch TV to fall asleep. She takes one 50mg  Trazodone tablet each night. She does need to wake in the night to urinate.   Her sources of joy are curling up on her couch and watching TV. She has cataracts and is unable to read. She is sad that she is unable to drive and leave the house and it is too cold for her outside now, which she feels is worsening her depression.   Pain Inventory Average Pain 7 Pain Right Now 9 My pain is aching  In the last 24 hours, has pain interfered with the following? General activity 9 Relation with others 0 Enjoyment of life 9 What TIME of day is your pain at its worst? morning Sleep (in general) Poor  Pain is worse with: "other" Pain improves with: other Relief from Meds: 5  Mobility use a cane use a walker ability to climb steps?  no do you drive?  no use a wheelchair needs help with transfers  Function not employed: date last employed . I need assistance with the following:  dressing, bathing, toileting, meal prep, household duties and shopping  Neuro/Psych bladder control problems bowel control problems weakness tingling trouble walking dizziness confusion depression  anxiety loss of taste or smell  Prior Studies Any changes since last visit?  no  Physicians involved in your care Any changes since last visit?  no   Family History  Problem Relation Age of Onset  . Cancer Mother        Unknown type  . Cancer Sister        Breast  . Clotting disorder Neg Hx    Social History   Socioeconomic History  . Marital status: Widowed    Spouse name: Not on file  . Number of children: 1  . Years of education: Not on file  . Highest education level: Not on file  Occupational History  . Not on file  Social Needs  . Financial resource strain: Not hard at all  . Food insecurity    Worry: Never true    Inability: Never true  . Transportation needs    Medical: No    Non-medical: No  Tobacco Use  . Smoking status: Former Smoker    Packs/day: 0.50    Types: Cigarettes    Start date: 07/16/1960    Quit date: 07/16/1998    Years since quitting: 21.3  . Smokeless tobacco: Never Used  Substance and Sexual Activity  . Alcohol use: No    Alcohol/week: 0.0 standard drinks  . Drug use: No  . Sexual activity: Never  Lifestyle  . Physical activity    Days per  week: 0 days    Minutes per session: 0 min  . Stress: To some extent  Relationships  . Social connections    Talks on phone: More than three times a week    Gets together: More than three times a week    Attends religious service: Never    Active member of club or organization: No    Attends meetings of clubs or organizations: Never    Relationship status: Widowed  Other Topics Concern  . Not on file  Social History Narrative   Widowed.  Lives alone in her own home.  Ambulates with a cane/walker when needed.   Past Surgical History:  Procedure Laterality Date  . ABDOMINAL HYSTERECTOMY  age 6 or 19  . APPENDECTOMY  yrs ago  . BILATERAL SALPINGOOPHORECTOMY  age 70 or 64  . COLONOSCOPY N/A 11/17/2013   Procedure: COLONOSCOPY;  Surgeon: Cleotis Nipper, MD;  Location: [facility];   Service: Endoscopy;  Laterality: N/A;  . ESOPHAGOGASTRODUODENOSCOPY N/A 11/17/2013   Procedure: ESOPHAGOGASTRODUODENOSCOPY (EGD);  Surgeon: Cleotis Nipper, MD;  Location: [facility];  Service: Endoscopy;  Laterality: N/A;  . HEMORRHOID SURGERY  yrs ago  . TONSILLECTOMY  yrs ago   Past Medical History:  Diagnosis Date  . ANXIETY 07/23/2009  . Atherosclerosis of aorta ([place]) 07/24/2015  . Complication of anesthesia 7 or 8 yrs ago   woke up during colonscopy  . COPD (chronic obstructive pulmonary disease) ([place])   . Coronary atherosclerosis 07/24/2015  . DEPRESSION 07/23/2009  . DIVERTICULOSIS, COLON 07/23/2009  . GERD 07/23/2009  . Headache(784.0) 07/23/2009   occasional  . Hemorrhoids   . HIP PAIN, LEFT 05/16/2010  . History of Crohn's disease   . HYPERLIPIDEMIA 07/23/2009  . HYPERTENSION 07/23/2009  . HYPOTHYROIDISM 07/23/2009  . IBS (irritable bowel syndrome)   . Insomnia   . LOW BACK PAIN, CHRONIC 10/01/2009  . PARESTHESIA 10/01/2009  . Shingles 2006   back   BP 113/64   Pulse 61   Ht 4\' 11"  (1.499 m) Comment: last reported  Wt 102 lb (46.3 kg) Comment: last reported  SpO2 (!) 89%   BMI 20.60 kg/m   Opioid Risk Score:   Fall Risk Score:  `1  Depression screen PHQ 2/9  Depression screen [facility] 2/9 11/10/2019 08/03/2018 07/31/2017 07/31/2017 07/31/2017 06/11/2016 01/08/2015  Decreased Interest 3 1 0 0 0 0 1  Down, Depressed, Hopeless 3 2 0 0 - 0 (No Data)  PHQ - 2 Score 6 3 0 0 0 0 1  Altered sleeping 3 1 - - - - -  Tired, decreased energy 3 2 - - - - -  Change in appetite 3 0 - - - - -  Feeling bad or failure about yourself  0 1 - - - - -  Trouble concentrating 3 0 - - - - -  Moving slowly or fidgety/restless 1 0 - - - - -  Suicidal thoughts 0 0 - - - - -  PHQ-9 Score 19 7 - - - - -  Difficult doing work/chores Very difficult Not difficult at all - - - - -  Some recent data might be hidden    Review of Systems  Constitutional: Positive for appetite change and unexpected  weight change.       Wt loss/ poor appetite  Eyes: Negative.   Respiratory: Negative.   Cardiovascular: Positive for leg swelling.  Gastrointestinal: Positive for abdominal pain, constipation, diarrhea, nausea and vomiting.  Endocrine: Negative.  Genitourinary: Positive for difficulty urinating and dysuria.  Musculoskeletal: Positive for gait problem.  Skin: Positive for rash.  Allergic/Immunologic: Negative.   Neurological: Positive for weakness.       Tingling  Hematological: Negative.   Psychiatric/Behavioral: Positive for dysphoric mood. The patient is nervous/anxious.   All other systems reviewed and are negative.      Objective:   Physical Exam  Gen: no distress, tired appearing HEENT: oral mucosa pink and moist, NCAT Cardio: Reg rate Chest: normal effort, normal rate of breathing Abd: soft, non-distended Ext: no edema Skin: intact Neuro: Alert.  Musculoskeletal: 4-/5 strength throughout. Able to stand with MinA (did not have her RW which she normally uses at home). Took a few steps CG. Able to flex spine minimally, which reproduced pain. Very limited spinal extension, and this aggravated pain.  Psych: pleasant, fatigued      Assessment & Plan:  84 year old woman presenting with diffuse body pain in her bilateral shoulder, knees, hips, ankles, and lower back. She also reports shooting pain down both legs.  Fibromyalgia --Her diffuse body pain is likely secondary to fibromyalgia. --Exercise is the best treatment and she does feel that when she moves her pain improves, but right now she is in too much pain to move much. --I will prescribed Lyrica 25mg  daily which has been shown to be beneficial for fibromyalgia patients.  Spinal stenosis: --Her lower back pain that is worst with extension and sleep is likely secondary to spinal stenosis, which is also more likely given her age. She has bilateral neurogenic claudication and the lyrica should also help with this.  --I  will continue her Tramadol since it would not be safe to stop completely, but advised that we will begin to taper it off in favor of Lyrica, which is a more appropriate treatment for her pain. She will decrease from 1-2 tablets every 6 hours to 1 tablet every 6 hours. As we wean off Tramadol, her blood pressure may also normalize (953 systolic).  Insomnia: --Will increase Trazodone to 100mg . Advised of increased fall risk during night. If patient feels more off balance at night, will return to former dose.  --It is crucial for her sleep to improve in order to improve her overall health and pain. --Discussed good sleep hygiene techniques.   Forty-five minutes of face to face patient care time were spent during this visit. All questions were encouraged and answered. Follow up with me in 4 weeks.

## 2019-11-16 ENCOUNTER — Other Ambulatory Visit: Payer: Self-pay | Admitting: Family

## 2019-11-20 ENCOUNTER — Other Ambulatory Visit: Payer: Self-pay | Admitting: Family

## 2019-11-22 IMAGING — CT CT LUMBAR SPINE WITHOUT CONTRAST
3 of 7 series · 11 of 33 positions shown, 13 images · non-contrast
Comparison: 04/22/2019

CLINICAL DATA: Fell 3 weeks ago.  Persistent low back pain.

EXAM:
CT LUMBAR SPINE WITHOUT CONTRAST
TECHNIQUE: Multidetector CT imaging of the lumbar spine was performed without
intravenous contrast administration. Multiplanar CT image
reconstructions were also generated.

[Series 4: l spine 2.0 i30s 2 · axial · 0.25mm/px · z∈[-190,-80]mm · 3 of 97 slices shown, 4 images]
[im 28/97  soft-tissue]
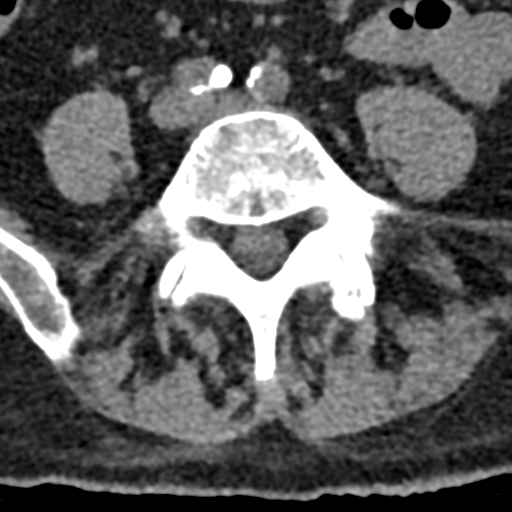
[im 28/97  bone]
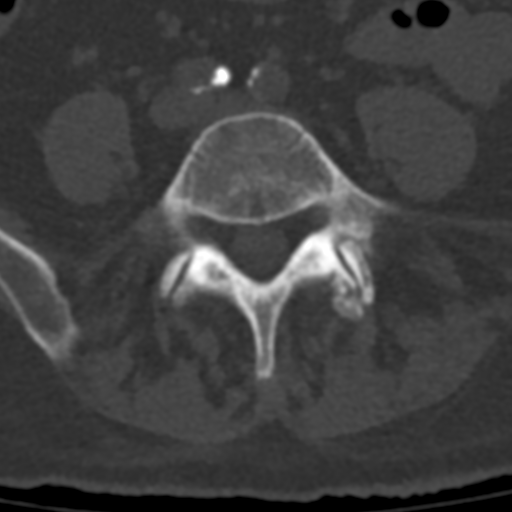
[im 55/97  bone]
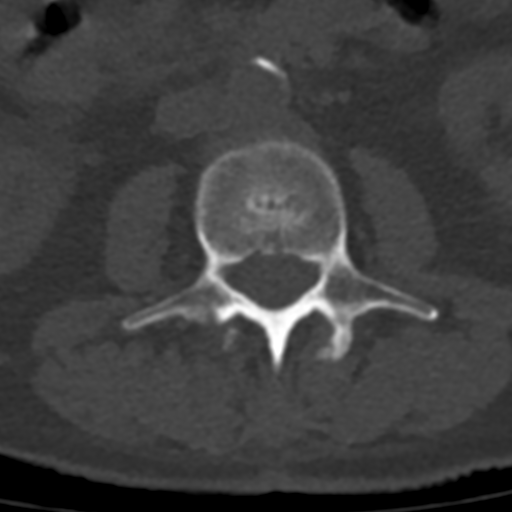
[im 83/97  bone]
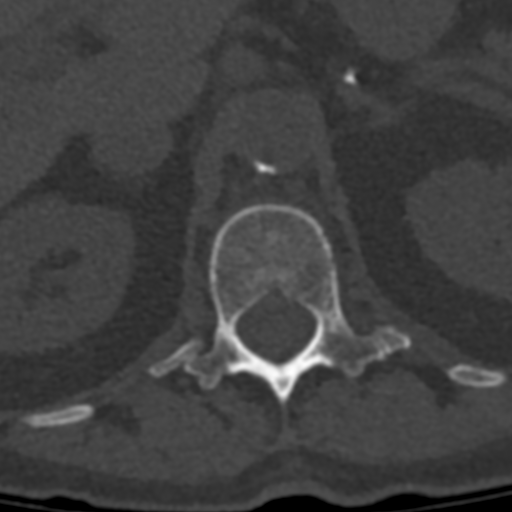

[Series 8: coronal st · coronal · 0.23mm/px · 3 of 61 slices shown]
[im 13/61  bone]
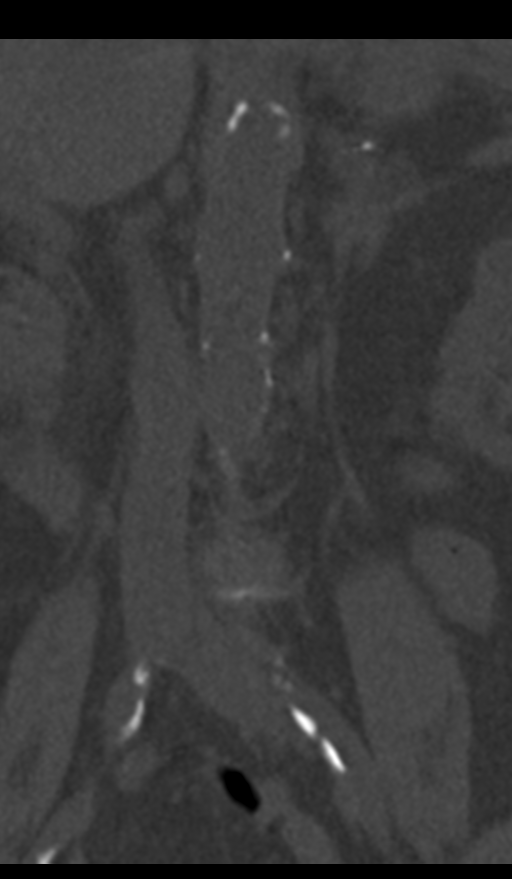
[im 25/61  bone]
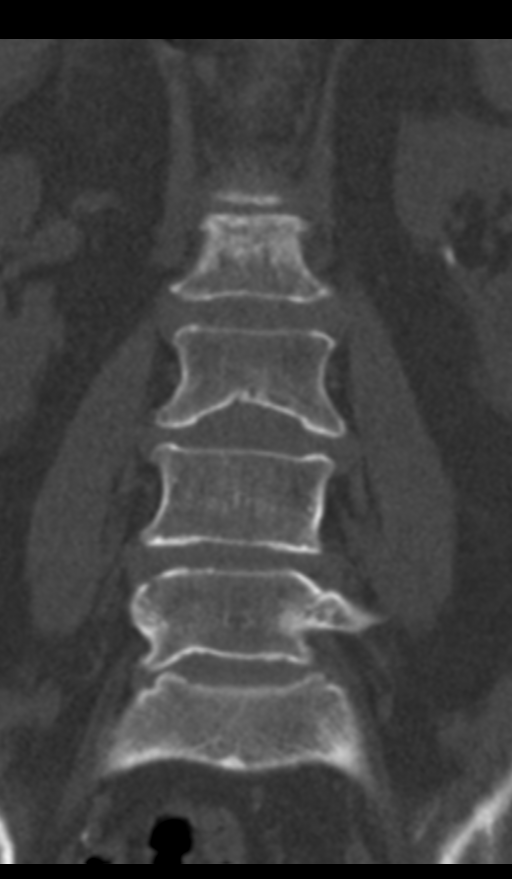
[im 37/61  bone]
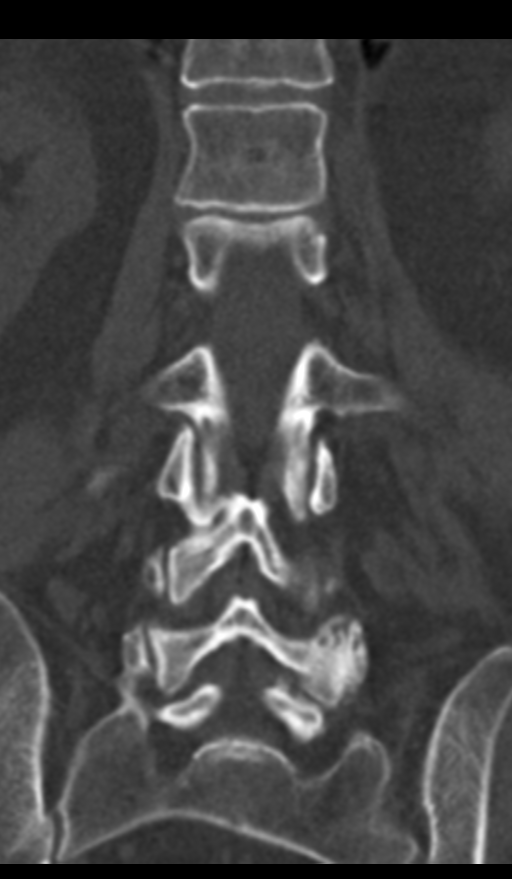

[Series 9: sagittal st · sagittal · 0.23mm/px · 5 of 61 slices shown, 6 images]
[im 21/61  bone]
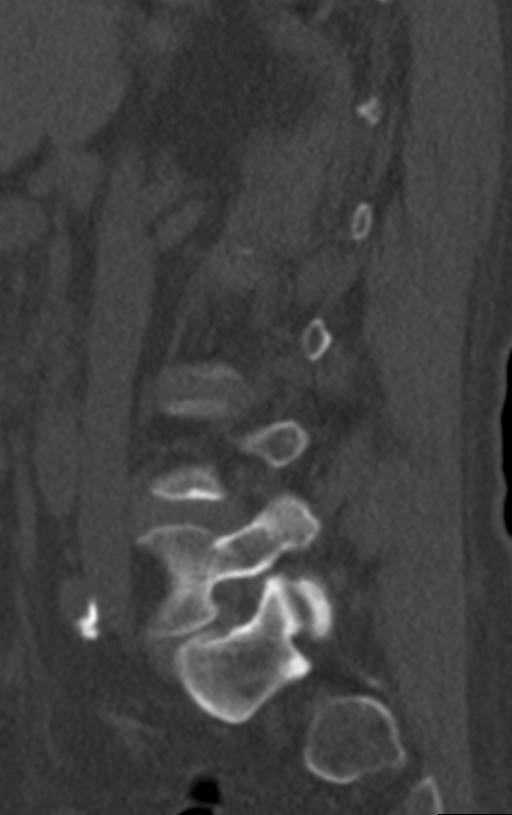
[im 26/61  bone]
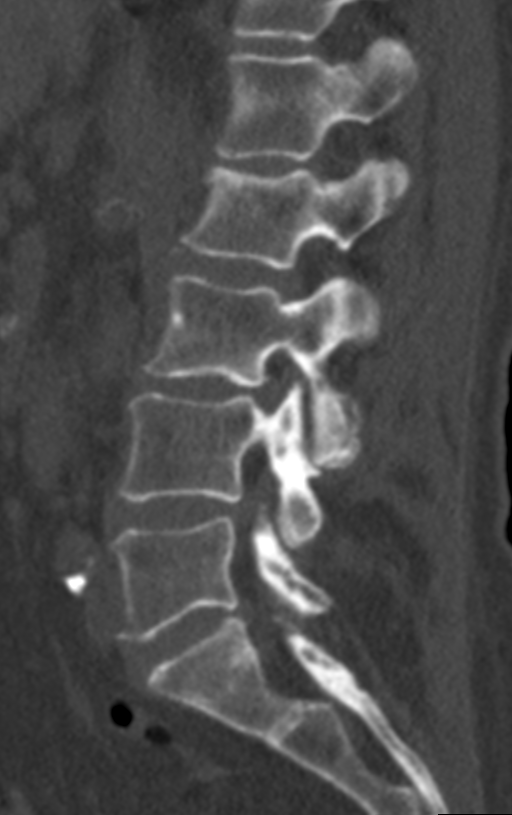
[im 31/61  soft-tissue]
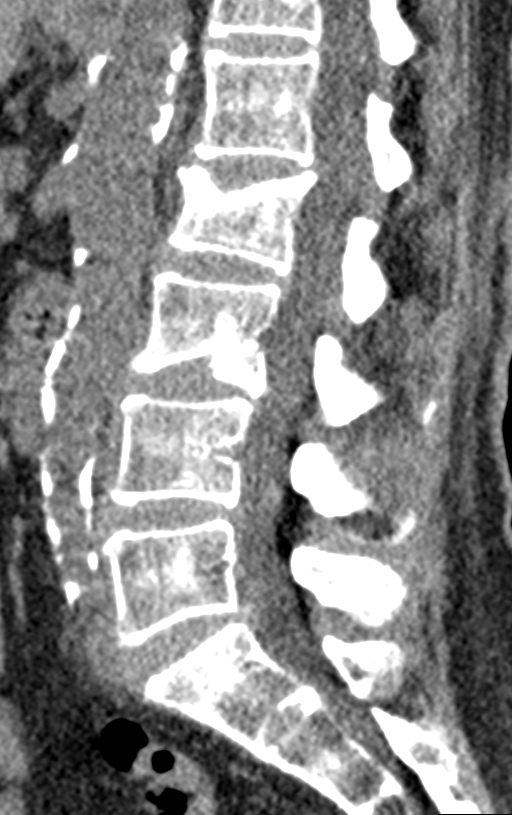
[im 31/61  bone]
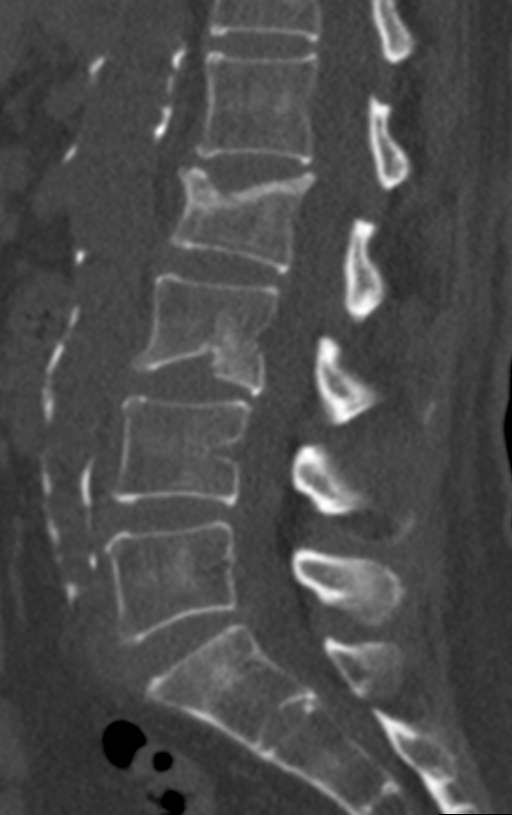
[im 36/61  bone]
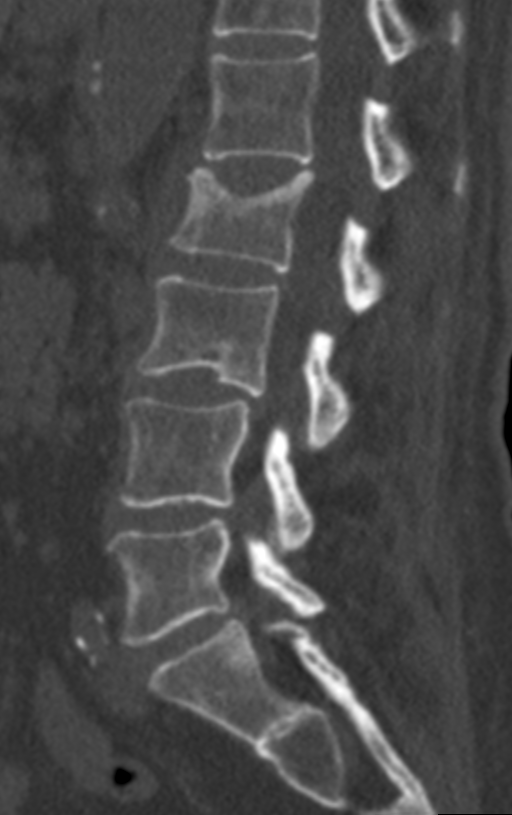
[im 41/61  bone]
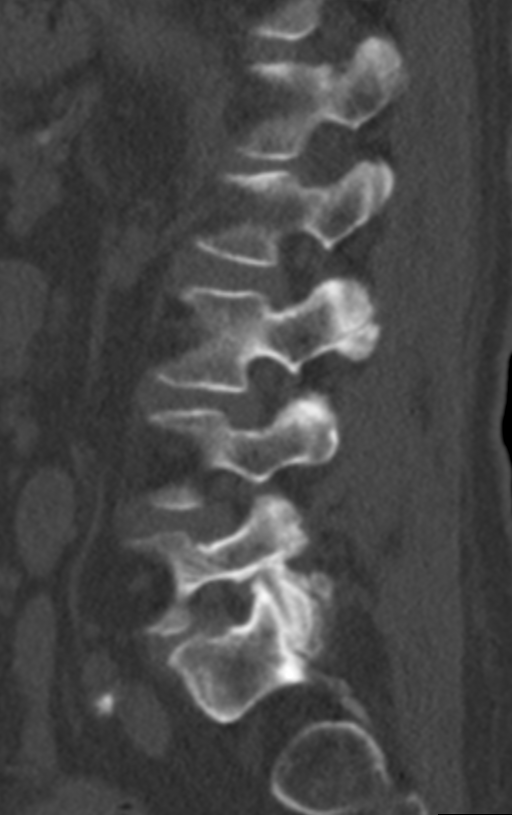

[11 of 33 positions shown; findings below may reference images not displayed]

FINDINGS: Segmentation: There are five lumbar type vertebral bodies. The last
full intervertebral disc space is labeled L5-S1. This correlates
with the prior CT.

Alignment: Normal

Vertebrae: There is a new superior endplate compression fracture of
L2. Minimal retropulsion involving the posterosuperior aspect of the
vertebral body but no canal stenosis.

Remote inferior endplate fracture or Schmorl's node involving L3.

No other significant bony findings. The facets are normally aligned.
Stable facet disease but no pars defects or pars fractures. Stable
sclerotic L3 lesion, likely benign bone island.

Paraspinal and other soft tissues: No significant findings. Stable
advanced atherosclerotic calcifications involving the aorta and
renal arteries. No retroperitoneal mass or hematoma.

Disc levels: No significant change since recent prior study. There
is a diffuse bulging degenerated annulus at L4-5 contributing to
mild spinal and lateral recess stenosis. There is also a stable
bulging annulus at L5-S1. No significant disc protrusions.
IMPRESSION: 1. New superior endplate compression fracture of L2 likely account
for the patient's persistent back pain.
2. Stable remote inferior endplate fracture or Schmorl's node at L3.
3. Stable disc disease and facet disease at L4-5 and L5-S1.

## 2019-12-03 ENCOUNTER — Other Ambulatory Visit: Payer: Self-pay | Admitting: Physical Medicine and Rehabilitation

## 2019-12-05 ENCOUNTER — Other Ambulatory Visit: Payer: Self-pay | Admitting: Physical Medicine and Rehabilitation

## 2019-12-10 ENCOUNTER — Other Ambulatory Visit: Payer: Self-pay | Admitting: Physical Medicine and Rehabilitation

## 2019-12-10 ENCOUNTER — Other Ambulatory Visit: Payer: Self-pay | Admitting: Family

## 2019-12-12 ENCOUNTER — Encounter: Payer: Medicare Other | Admitting: Physical Medicine and Rehabilitation

## 2019-12-15 ENCOUNTER — Other Ambulatory Visit: Payer: Self-pay | Admitting: Family

## 2019-12-28 ENCOUNTER — Encounter
Payer: Medicare Other | Attending: Physical Medicine and Rehabilitation | Admitting: Physical Medicine and Rehabilitation

## 2019-12-28 ENCOUNTER — Encounter: Payer: Self-pay | Admitting: Physical Medicine and Rehabilitation

## 2019-12-28 ENCOUNTER — Other Ambulatory Visit: Payer: Self-pay

## 2019-12-28 VITALS — Ht 59.0 in | Wt 101.0 lb

## 2019-12-28 DIAGNOSIS — M797 Fibromyalgia: Secondary | ICD-10-CM

## 2019-12-28 DIAGNOSIS — M48062 Spinal stenosis, lumbar region with neurogenic claudication: Secondary | ICD-10-CM

## 2019-12-28 MED ORDER — TRAZODONE HCL 150 MG PO TABS: 150.0000 mg | ORAL_TABLET | Freq: Every day | ORAL | 6 refills | Status: DC

## 2019-12-28 MED ORDER — PREGABALIN 25 MG PO CAPS: 25.0000 mg | ORAL_CAPSULE | Freq: Two times a day (BID) | ORAL | 6 refills | Status: DC

## 2019-12-28 MED ORDER — TRAMADOL HCL 50 MG PO TABS: ORAL_TABLET | ORAL | 5 refills | Status: DC

## 2019-12-28 NOTE — Progress Notes (Signed)
Subjective:    Patient ID: Heather [name], female    DOB: [birth year], 85 y.o.   MRN: [id number]  HPI  Due to national recommendations of social distancing because of COVID 25, an audio/video tele-health visit is felt to be the most appropriate encounter for this patient at this time. See MyChart message from today for the patient's consent to a tele-health encounter with Nikolai. This is a follow up tele-visit via phone. The patient is at home. MD is at office.   85 year old woman presenting with diffuse body pain in her bilateral shoulder, knees, hips, ankles, and lower back. She also reports shooting pain down both legs. She suffered a nontraumatic L5 compression fracture and as per her son, has had several falls at home. She lives alone and has a fall safety device. Her son visits once per week and helps with chores, but they rarely leave the house.   She has done physical therapy and remembers her exercises well and tries to perform them regularly. For pain she takes 1-2 50mg  tablets of Tramadol every 6 hours. She needs a refill and she feels that she needs a stronger dose. She is also taking Lyrica 25mg  every morning.   She has severe insomnia and has only been sleeping 3 hours per night for a long time. She says she needs to watch TV to fall asleep. She takes one 50mg  Trazodone tablet each night. She does need to wake in the night to urinate.   Her sources of joy are curling up on her couch and watching TV. She has cataracts and is unable to read. She is sad that she is unable to drive and leave the house and it is too cold for her outside now, which she feels is worsening her depression.   Pain Inventory Average Pain 5 Pain Right Now 9 My pain is constant, dull and aching  In the last 24 hours, has pain interfered with the following? General activity 5 Relation with others 5 Enjoyment of life 5 What TIME of day is your pain at its worst?  evening Sleep (in general) Poor  Pain is worse with: walking, bending, standing and some activites Pain improves with: therapy/exercise, pacing activities and medication Relief from Meds: 0  Mobility use a walker ability to climb steps?  no do you drive?  no  Function retired  Neuro/Psych trouble walking  Prior Studies Any changes since last visit?  no  Physicians involved in your care Any changes since last visit?  no   Family History  Problem Relation Age of Onset  . Cancer Mother        Unknown type  . Cancer Sister        Breast  . Clotting disorder Neg Hx    Social History   Socioeconomic History  . Marital status: Widowed    Spouse name: Not on file  . Number of children: 1  . Years of education: Not on file  . Highest education level: Not on file  Occupational History  . Not on file  Tobacco Use  . Smoking status: Former Smoker    Packs/day: 0.50    Types: Cigarettes    Start date: 07/16/1960    Quit date: 07/16/1998    Years since quitting: 21.4  . Smokeless tobacco: Never Used  Substance and Sexual Activity  . Alcohol use: No    Alcohol/week: 0.0 standard drinks  . Drug use: No  . Sexual activity:  Never  Other Topics Concern  . Not on file  Social History Narrative   Widowed.  Lives alone in her own home.  Ambulates with a cane/walker when needed.   Social Determinants of Health   Financial Resource Strain:   . Difficulty of Paying Living Expenses: Not on file  Food Insecurity:   . Worried About Charity fundraiser in the Last Year: Not on file  . Ran Out of Food in the Last Year: Not on file  Transportation Needs:   . Lack of Transportation (Medical): Not on file  . Lack of Transportation (Non-Medical): Not on file  Physical Activity:   . Days of Exercise per Week: Not on file  . Minutes of Exercise per Session: Not on file  Stress:   . Feeling of Stress : Not on file  Social Connections:   . Frequency of Communication with Friends  and Family: Not on file  . Frequency of Social Gatherings with Friends and Family: Not on file  . Attends Religious Services: Not on file  . Active Member of Clubs or Organizations: Not on file  . Attends Archivist Meetings: Not on file  . Marital Status: Not on file   Past Surgical History:  Procedure Laterality Date  . ABDOMINAL HYSTERECTOMY  age 19 or 16  . APPENDECTOMY  yrs ago  . BILATERAL SALPINGOOPHORECTOMY  age 7 or 66  . COLONOSCOPY N/A 11/17/2013   Procedure: COLONOSCOPY;  Surgeon: Cleotis Nipper, MD;  Location: [facility];  Service: Endoscopy;  Laterality: N/A;  . ESOPHAGOGASTRODUODENOSCOPY N/A 11/17/2013   Procedure: ESOPHAGOGASTRODUODENOSCOPY (EGD);  Surgeon: Cleotis Nipper, MD;  Location: [facility];  Service: Endoscopy;  Laterality: N/A;  . HEMORRHOID SURGERY  yrs ago  . TONSILLECTOMY  yrs ago   Past Medical History:  Diagnosis Date  . ANXIETY 07/23/2009  . Atherosclerosis of aorta ([place]) 07/24/2015  . Complication of anesthesia 7 or 8 yrs ago   woke up during colonscopy  . COPD (chronic obstructive pulmonary disease) ([place])   . Coronary atherosclerosis 07/24/2015  . DEPRESSION 07/23/2009  . DIVERTICULOSIS, COLON 07/23/2009  . GERD 07/23/2009  . Headache(784.0) 07/23/2009   occasional  . Hemorrhoids   . HIP PAIN, LEFT 05/16/2010  . History of Crohn's disease   . HYPERLIPIDEMIA 07/23/2009  . HYPERTENSION 07/23/2009  . HYPOTHYROIDISM 07/23/2009  . IBS (irritable bowel syndrome)   . Insomnia   . LOW BACK PAIN, CHRONIC 10/01/2009  . PARESTHESIA 10/01/2009  . Shingles 2006   back   There were no vitals taken for this visit.  Opioid Risk Score:   Fall Risk Score:  `1  Depression screen PHQ 2/9  Depression screen [facility] 2/9 11/10/2019 08/03/2018 07/31/2017 07/31/2017 07/31/2017 06/11/2016 01/08/2015  Decreased Interest 3 1 0 0 0 0 1  Down, Depressed, Hopeless 3 2 0 0 - 0 (No Data)  PHQ - 2 Score 6 3 0 0 0 0 1  Altered sleeping 3 1 - - - - -  Tired, decreased  energy 3 2 - - - - -  Change in appetite 3 0 - - - - -  Feeling bad or failure about yourself  0 1 - - - - -  Trouble concentrating 3 0 - - - - -  Moving slowly or fidgety/restless 1 0 - - - - -  Suicidal thoughts 0 0 - - - - -  PHQ-9 Score 19 7 - - - - -  Difficult doing work/chores Very  difficult Not difficult at all - - - - -  Some recent data might be hidden     Review of Systems  Constitutional: Positive for chills.  HENT: Negative.   Eyes: Negative.   Respiratory: Negative.   Cardiovascular: Positive for leg swelling.  Endocrine: Negative.   Genitourinary: Positive for dysuria.  Musculoskeletal: Negative.   Skin: Negative.   Allergic/Immunologic: Negative.   Neurological: Negative.   Hematological: Negative.   Psychiatric/Behavioral: Negative.   All other systems reviewed and are negative.      Objective:   Physical Exam  Not performed       Assessment & Plan:  85 year old woman presenting with diffuse body pain in her bilateral shoulder, knees, hips, ankles, and lower back. She also reports shooting pain down both legs.  Fibromyalgia --Her diffuse body pain is likely secondary to fibromyalgia. --Exercise is the best treatment and she does feel that when she moves her pain improves, but right now she is in too much pain to move much. --I will increase Lyrica 25mg  twice per day which has been shown to be beneficial for fibromyalgia patients.  Spinal stenosis: --Her lower back pain that is worst with extension and sleep is likely secondary to spinal stenosis, which is also more likely given her age. She has bilateral neurogenic claudication and the lyrica should also help with this.  --I will continue her Tramadol since it would not be safe to stop completely, but advised that we will begin to taper it off in favor of Lyrica, which is a more appropriate treatment for her pain. She will decrease from 1-2 tablets every 6 hours to 1 tablet every 6 hours. As we wean  off Tramadol, her blood pressure may also normalize (643 systolic).  Insomnia: --Will increase Trazodone to 150mg . Advised of increased fall risk during night. If patient feels more off balance at night, will return to former dose.  --It is crucial for her sleep to improve in order to improve her overall health and pain. --Discussed good sleep hygiene techniques.   Skin Tear: --The nurse that visited her yesterday said that she may need an antibiotic.  --Son says there is some redness and a scab, no purulent drainage, fever, or chills. I advised that if she were to develop any of these symptoms, he should call our clinic for an antibiotic prescription and evaluation.   20 minutes of face to face patient care time were spent during this visit. All questions were encouraged and answered. Follow up with me in 5 months.

## 2020-01-10 ENCOUNTER — Other Ambulatory Visit: Payer: Self-pay | Admitting: Family

## 2020-01-19 ENCOUNTER — Inpatient Hospital Stay: Admission: RE | Admit: 2020-01-19 | Payer: Medicare Other | Source: Ambulatory Visit

## 2020-01-25 ENCOUNTER — Ambulatory Visit: Payer: Medicare Other | Admitting: Pulmonary Disease

## 2020-02-03 ENCOUNTER — Inpatient Hospital Stay (HOSPITAL_COMMUNITY)
Admission: EM | Admit: 2020-02-03 | Discharge: 2020-02-08 | DRG: 312 | Disposition: A | Discharge status: Skilled Nursing Facility | Payer: Medicare Other | Attending: Internal Medicine | Admitting: Internal Medicine

## 2020-02-03 ENCOUNTER — Emergency Department (HOSPITAL_COMMUNITY): Payer: Medicare Other

## 2020-02-03 DIAGNOSIS — E039 Hypothyroidism, unspecified: Secondary | ICD-10-CM | POA: Diagnosis not present

## 2020-02-03 DIAGNOSIS — E785 Hyperlipidemia, unspecified: Secondary | ICD-10-CM | POA: Diagnosis present

## 2020-02-03 DIAGNOSIS — M25552 Pain in left hip: Secondary | ICD-10-CM | POA: Diagnosis present

## 2020-02-03 DIAGNOSIS — I2510 Atherosclerotic heart disease of native coronary artery without angina pectoris: Secondary | ICD-10-CM | POA: Diagnosis not present

## 2020-02-03 DIAGNOSIS — Z20822 Contact with and (suspected) exposure to covid-19: Secondary | ICD-10-CM | POA: Diagnosis present

## 2020-02-03 DIAGNOSIS — R0789 Other chest pain: Secondary | ICD-10-CM | POA: Diagnosis not present

## 2020-02-03 DIAGNOSIS — Z886 Allergy status to analgesic agent status: Secondary | ICD-10-CM

## 2020-02-03 DIAGNOSIS — G894 Chronic pain syndrome: Secondary | ICD-10-CM | POA: Diagnosis not present

## 2020-02-03 DIAGNOSIS — F329 Major depressive disorder, single episode, unspecified: Secondary | ICD-10-CM | POA: Diagnosis present

## 2020-02-03 DIAGNOSIS — J439 Emphysema, unspecified: Secondary | ICD-10-CM | POA: Diagnosis present

## 2020-02-03 DIAGNOSIS — R55 Syncope and collapse: Secondary | ICD-10-CM | POA: Diagnosis not present

## 2020-02-03 DIAGNOSIS — Z803 Family history of malignant neoplasm of breast: Secondary | ICD-10-CM | POA: Diagnosis not present

## 2020-02-03 DIAGNOSIS — Z7951 Long term (current) use of inhaled steroids: Secondary | ICD-10-CM

## 2020-02-03 DIAGNOSIS — R079 Chest pain, unspecified: Secondary | ICD-10-CM

## 2020-02-03 DIAGNOSIS — R0902 Hypoxemia: Secondary | ICD-10-CM | POA: Diagnosis not present

## 2020-02-03 DIAGNOSIS — T50915A Adverse effect of multiple unspecified drugs, medicaments and biological substances, initial encounter: Secondary | ICD-10-CM | POA: Diagnosis present

## 2020-02-03 DIAGNOSIS — R296 Repeated falls: Secondary | ICD-10-CM | POA: Diagnosis present

## 2020-02-03 DIAGNOSIS — I340 Nonrheumatic mitral (valve) insufficiency: Secondary | ICD-10-CM | POA: Diagnosis not present

## 2020-02-03 DIAGNOSIS — Z888 Allergy status to other drugs, medicaments and biological substances status: Secondary | ICD-10-CM

## 2020-02-03 DIAGNOSIS — S79912A Unspecified injury of left hip, initial encounter: Secondary | ICD-10-CM | POA: Diagnosis not present

## 2020-02-03 DIAGNOSIS — Z79890 Hormone replacement therapy (postmenopausal): Secondary | ICD-10-CM | POA: Diagnosis not present

## 2020-02-03 DIAGNOSIS — Z66 Do not resuscitate: Secondary | ICD-10-CM | POA: Diagnosis present

## 2020-02-03 DIAGNOSIS — W19XXXA Unspecified fall, initial encounter: Secondary | ICD-10-CM | POA: Diagnosis not present

## 2020-02-03 DIAGNOSIS — I10 Essential (primary) hypertension: Secondary | ICD-10-CM | POA: Diagnosis present

## 2020-02-03 DIAGNOSIS — S199XXA Unspecified injury of neck, initial encounter: Secondary | ICD-10-CM | POA: Diagnosis not present

## 2020-02-03 DIAGNOSIS — R001 Bradycardia, unspecified: Secondary | ICD-10-CM | POA: Diagnosis not present

## 2020-02-03 DIAGNOSIS — Z79899 Other long term (current) drug therapy: Secondary | ICD-10-CM | POA: Diagnosis not present

## 2020-02-03 DIAGNOSIS — K219 Gastro-esophageal reflux disease without esophagitis: Secondary | ICD-10-CM | POA: Diagnosis present

## 2020-02-03 DIAGNOSIS — S299XXA Unspecified injury of thorax, initial encounter: Secondary | ICD-10-CM | POA: Diagnosis not present

## 2020-02-03 DIAGNOSIS — Z87891 Personal history of nicotine dependence: Secondary | ICD-10-CM

## 2020-02-03 DIAGNOSIS — R072 Precordial pain: Secondary | ICD-10-CM | POA: Diagnosis not present

## 2020-02-03 DIAGNOSIS — M2550 Pain in unspecified joint: Secondary | ICD-10-CM | POA: Diagnosis not present

## 2020-02-03 DIAGNOSIS — J449 Chronic obstructive pulmonary disease, unspecified: Secondary | ICD-10-CM | POA: Diagnosis not present

## 2020-02-03 DIAGNOSIS — I361 Nonrheumatic tricuspid (valve) insufficiency: Secondary | ICD-10-CM | POA: Diagnosis not present

## 2020-02-03 DIAGNOSIS — Z743 Need for continuous supervision: Secondary | ICD-10-CM | POA: Diagnosis not present

## 2020-02-03 DIAGNOSIS — I959 Hypotension, unspecified: Secondary | ICD-10-CM | POA: Diagnosis not present

## 2020-02-03 DIAGNOSIS — E876 Hypokalemia: Secondary | ICD-10-CM | POA: Diagnosis not present

## 2020-02-03 DIAGNOSIS — S0990XA Unspecified injury of head, initial encounter: Secondary | ICD-10-CM | POA: Diagnosis not present

## 2020-02-03 DIAGNOSIS — Z90710 Acquired absence of both cervix and uterus: Secondary | ICD-10-CM

## 2020-02-03 DIAGNOSIS — F419 Anxiety disorder, unspecified: Secondary | ICD-10-CM | POA: Diagnosis present

## 2020-02-03 DIAGNOSIS — Z7401 Bed confinement status: Secondary | ICD-10-CM | POA: Diagnosis not present

## 2020-02-03 DIAGNOSIS — J438 Other emphysema: Secondary | ICD-10-CM | POA: Diagnosis present

## 2020-02-03 LAB — URINALYSIS, ROUTINE W REFLEX MICROSCOPIC
Bilirubin Urine: NEGATIVE
Glucose, UA: NEGATIVE mg/dL
Hgb urine dipstick: NEGATIVE
Ketones, ur: 20 mg/dL — AB
Leukocytes,Ua: NEGATIVE
Nitrite: NEGATIVE
Protein, ur: NEGATIVE mg/dL
Specific Gravity, Urine: 1.015 (ref 1.005–1.030)
pH: 6 (ref 5.0–8.0)

## 2020-02-03 LAB — CBC WITH DIFFERENTIAL/PLATELET
Abs Immature Granulocytes: 0.03 10*3/uL (ref 0.00–0.07)
Basophils Absolute: 0 10*3/uL (ref 0.0–0.1)
Basophils Relative: 1 %
Eosinophils Absolute: 0 10*3/uL (ref 0.0–0.5)
Eosinophils Relative: 1 %
HCT: 33.7 % — ABNORMAL LOW (ref 36.0–46.0)
Hemoglobin: 9.9 g/dL — ABNORMAL LOW (ref 12.0–15.0)
Immature Granulocytes: 1 %
Lymphocytes Relative: 19 %
Lymphs Abs: 1.1 10*3/uL (ref 0.7–4.0)
MCH: 25.8 pg — ABNORMAL LOW (ref 26.0–34.0)
MCHC: 29.4 g/dL — ABNORMAL LOW (ref 30.0–36.0)
MCV: 88 fL (ref 80.0–100.0)
Monocytes Absolute: 0.5 10*3/uL (ref 0.1–1.0)
Monocytes Relative: 8 %
Neutro Abs: 4.2 10*3/uL (ref 1.7–7.7)
Neutrophils Relative %: 70 %
Platelets: 358 10*3/uL (ref 150–400)
RBC: 3.83 MIL/uL — ABNORMAL LOW (ref 3.87–5.11)
RDW: 15.9 % — ABNORMAL HIGH (ref 11.5–15.5)
WBC: 5.9 10*3/uL (ref 4.0–10.5)
nRBC: 0 % (ref 0.0–0.2)

## 2020-02-03 LAB — TROPONIN I (HIGH SENSITIVITY): Troponin I (High Sensitivity): 3 ng/L (ref <18)

## 2020-02-03 LAB — COMPREHENSIVE METABOLIC PANEL
ALT: 9 U/L (ref 0–44)
AST: 13 U/L — ABNORMAL LOW (ref 15–41)
Albumin: 3.2 g/dL — ABNORMAL LOW (ref 3.5–5.0)
Alkaline Phosphatase: 76 U/L (ref 38–126)
Anion gap: 13 (ref 5–15)
BUN: 7 mg/dL — ABNORMAL LOW (ref 8–23)
CO2: 22 mmol/L (ref 22–32)
Calcium: 8.6 mg/dL — ABNORMAL LOW (ref 8.9–10.3)
Chloride: 101 mmol/L (ref 98–111)
Creatinine, Ser: 0.9 mg/dL (ref 0.44–1.00)
GFR calc Af Amer: >60 mL/min (ref >60)
GFR calc non Af Amer: 60 mL/min — ABNORMAL LOW (ref >60)
Glucose, Bld: 120 mg/dL — ABNORMAL HIGH (ref 70–99)
Potassium: 3.5 mmol/L (ref 3.5–5.1)
Sodium: 136 mmol/L (ref 135–145)
Total Bilirubin: 0.2 mg/dL — ABNORMAL LOW (ref 0.3–1.2)
Total Protein: 5.8 g/dL — ABNORMAL LOW (ref 6.5–8.1)

## 2020-02-03 MED ORDER — SODIUM CHLORIDE 0.9 % IV BOLUS
500.0000 mL | Freq: Once | INTRAVENOUS | Status: AC
  Administered 2020-02-03: 500 mL via INTRAVENOUS

## 2020-02-03 MED ORDER — TRAMADOL HCL 50 MG PO TABS
50.0000 mg | ORAL_TABLET | Freq: Once | ORAL | Status: AC
  Administered 2020-02-03: 20:00:00 50 mg via ORAL
  Filled 2020-02-03: qty 1

## 2020-02-03 NOTE — ED Notes (Signed)
Patient transported to CT 

## 2020-02-03 NOTE — ED Provider Notes (Addendum)
[name] Provider Note   CSN: [id number] Arrival date & time: 02/03/20  1807     History Chief Complaint  Patient presents with  . Fall  . Loss of Consciousness    [name] is a 85 y.o. female who presents today for evaluation after a synocpal event.  She reports that She thinks maybe her walker slipped but does not remember it slipping or her falling.  She thinks she was unconscious for about 5 minutes.  No recent sickness or illness.  She reports that she has pain in the right side of her chest since the fall.  She denies any shortness of breath.  No prodromal symptoms.    She reports also new pain in her left hip.  She has chronic back pain that is unchanged from her baseline.   She denies any new pain in her abdomen, back, arms, or legs.   She does not take any anticoagulant medication.    HPI     Past Medical History:  Diagnosis Date  . ANXIETY 07/23/2009  . Atherosclerosis of aorta ([place]) 07/24/2015  . Complication of anesthesia 7 or 8 yrs ago   woke up during colonscopy  . COPD (chronic obstructive pulmonary disease) ([place])   . Coronary atherosclerosis 07/24/2015  . DEPRESSION 07/23/2009  . DIVERTICULOSIS, COLON 07/23/2009  . GERD 07/23/2009  . Headache(784.0) 07/23/2009   occasional  . Hemorrhoids   . HIP PAIN, LEFT 05/16/2010  . History of Crohn's disease   . HYPERLIPIDEMIA 07/23/2009  . HYPERTENSION 07/23/2009  . HYPOTHYROIDISM 07/23/2009  . IBS (irritable bowel syndrome)   . Insomnia   . LOW BACK PAIN, CHRONIC 10/01/2009  . PARESTHESIA 10/01/2009  . Shingles 2006   back    Patient Active Problem List   Diagnosis Date Noted  . Syncope and collapse 02/03/2020  . Fall 04/22/2019  . Elevated CK 04/22/2019  . Leukocytosis 04/22/2019  . Malignant neoplasm of lower lobe of right lung ([place]) 11/16/2018  . Dehydration   . Abdominal pain 02/27/2018  . Colitis 02/27/2018  . SBO (small bowel obstruction) ([place])  02/27/2018  . Hypokalemia 02/19/2018  . Oral thrush 01/29/2018  . Malnutrition of moderate degree 01/29/2018  . COPD with acute exacerbation ([place]) 01/28/2018  . 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 COPD exacerbation ([place]) 05/29/2016  . Hypoxia 05/29/2016  . Coronary atherosclerosis 07/24/2015  . Atherosclerosis of aorta ([place]) 07/24/2015  . Dysphagia 07/24/2015  . Acute respiratory failure with hypoxia ([place]) 07/23/2015  . Bronchospasm with bronchitis, acute 07/23/2015  . HIP PAIN, LEFT 05/16/2010  . LOW BACK PAIN, CHRONIC 10/01/2009  . PARESTHESIA 10/01/2009  . Hypothyroidism 07/23/2009  . Dyslipidemia 07/23/2009  . Anxiety 07/23/2009  . Anxiety and depression 07/23/2009  . Essential hypertension 07/23/2009  . GERD 07/23/2009  . DIVERTICULOSIS, COLON 07/23/2009  . HEADACHE 07/23/2009    Past Surgical History:  Procedure Laterality Date  . ABDOMINAL HYSTERECTOMY  age 46 or 64  . APPENDECTOMY  yrs ago  . BILATERAL SALPINGOOPHORECTOMY  age 47 or 89  . COLONOSCOPY N/A 11/17/2013   Procedure: COLONOSCOPY;  Surgeon: Cleotis Nipper, MD;  Location: [facility];  Service: Endoscopy;  Laterality: N/A;  . ESOPHAGOGASTRODUODENOSCOPY N/A 11/17/2013   Procedure: ESOPHAGOGASTRODUODENOSCOPY (EGD);  Surgeon: Cleotis Nipper, MD;  Location: [facility];  Service: Endoscopy;  Laterality: N/A;  . HEMORRHOID SURGERY  yrs ago  . TONSILLECTOMY  yrs ago     OB History   No obstetric  history on file.     Family History  Problem Relation Age of Onset  . Cancer Mother        Unknown type  . Cancer Sister        Breast  . Clotting disorder Neg Hx     Social History   Tobacco Use  . Smoking status: Former Smoker    Packs/day: 0.50    Types: Cigarettes    Start date: 07/16/1960    Quit date: 07/16/1998    Years since quitting: 21.5  . Smokeless tobacco: Never Used  Substance Use Topics  . Alcohol use: No    Alcohol/week: 0.0 standard drinks  . Drug use: No    Home  Medications Prior to Admission medications   Medication Sig Start Date End Date Taking? Authorizing Provider  Albuterol Sulfate (PROAIR RESPICLICK) 784 (90 Base) MCG/ACT AEPB Inhale 2 puffs into the lungs 4 (four) times daily as needed. Patient taking differently: Inhale 2 puffs into the lungs 4 (four) times daily as needed (sob and wheezing).  01/26/18   [name], MD  benzonatate (TESSALON) 100 MG capsule TAKE 1 CAPSULE TWICE DAILY AS NEEDED FOR COUGH. 11/01/19   Marrian Salvage, FNP  BREO ELLIPTA 200-25 MCG/INH AEPB INHALE 1 PUFF INTO THE LUNGS ONCE DAILY. 05/09/19   Marrian Salvage, FNP  clorazepate (TRANXENE) 15 MG tablet TAKE (1) TABLET TWICE DAILY AS NEEDED FOR ANXIETY. 01/11/20   Marrian Salvage, FNP  diltiazem (CARDIZEM CD) 240 MG 24 hr capsule TAKE (1) CAPSULE DAILY. 11/21/19   Marrian Salvage, FNP  esomeprazole (NEXIUM) 40 MG capsule TAKE 1 CAPSULE DAILY BEFORE BREAKFAST. 11/21/19   Marrian Salvage, FNP  folic acid (FOLVITE) 1 MG tablet TAKE 1 TABLET ONCE DAILY. 11/21/19   Marrian Salvage, FNP  furosemide (LASIX) 20 MG tablet TAKE 1 TABLET DAILY IF NEEDED FOR SIGNIFICANT LOWER EXTREMITY SWELLING. 01/19/19   Marrian Salvage, FNP  levothyroxine (SYNTHROID) 25 MCG tablet TAKE 1 TABLET DAILY BEFORE BREAKFAST. 05/25/19   Marrian Salvage, FNP  Multiple Vitamin (MULTIVITAMIN WITH MINERALS) TABS tablet Take 1 tablet by mouth daily. 03/14/18   Roxan Hockey, MD  MYRBETRIQ 25 MG TB24 tablet TAKE 1 TABLET ONCE DAILY. 12/16/19   Marrian Salvage, FNP  nitrofurantoin, macrocrystal-monohydrate, (MACROBID) 100 MG capsule Take 1 capsule (100 mg total) by mouth 2 (two) times daily. 06/14/19   Marrian Salvage, FNP  Polyethyl Glycol-Propyl Glycol (SYSTANE OP) Place 1 drop into both eyes 2 (two) times daily as needed (for dry eyes).     [provider]  potassium chloride SA (K-DUR) 20 MEQ tablet Take 1 tablet (20 mEq total) by  mouth daily. 06/22/19   Marrian Salvage, FNP  pregabalin (LYRICA) 25 MG capsule Take 1 capsule (25 mg total) by mouth 2 (two) times daily. 12/28/19   Raulkar, Clide Deutscher, MD  promethazine (PHENERGAN) 25 MG tablet TAKE 1 TABLET EVERY 8 HOURS AS NEEDED FOR NAUSEA & VOMITING. 09/07/19   Marrian Salvage, FNP  senna-docusate (SENOKOT-S) 8.6-50 MG tablet Take 2 tablets by mouth at bedtime. 03/13/18   Roxan Hockey, MD  sertraline (ZOLOFT) 50 MG tablet TAKE 1 TABLET EACH DAY. 11/21/19   Marrian Salvage, FNP  traMADol Veatrice [place]bon) 50 MG tablet Take 1-2 tablets every 4-6 hours prn pain 12/28/19   Raulkar, Clide Deutscher, MD  traZODone (DESYREL) 150 MG tablet Take 1 tablet (150 mg total) by mouth at bedtime. 12/28/19   Raulkar, Clide Deutscher,  MD  zolpidem (AMBIEN) 5 MG tablet TAKE 1 TABLET AT BEDTIME AS NEEDED. 07/18/19   Marrian Salvage, FNP  pregabalin (LYRICA) 25 MG capsule Take 1 capsule (25 mg total) by mouth daily. 11/10/19   Raulkar, Clide Deutscher, MD    Allergies    Tylenol [acetaminophen] and Symbicort [budesonide-formoterol fumarate]  Review of Systems   Review of Systems  Constitutional: Negative for chills and fever.  HENT: Negative for congestion.   Respiratory: Negative for cough, chest tightness and shortness of breath.   Cardiovascular: Positive for chest pain.  Gastrointestinal: Negative for abdominal pain, diarrhea, nausea and vomiting.  Genitourinary: Negative for dysuria, frequency and urgency.  Musculoskeletal: Positive for back pain (Chronic, unchanged).  Skin: Negative for color change, rash and wound.  Neurological: Positive for syncope, light-headedness and headaches. Negative for numbness.  Psychiatric/Behavioral: Negative for confusion. The patient is not nervous/anxious.   All other systems reviewed and are negative.   Physical Exam Updated Vital Signs BP (!) 127/42   Pulse (!) 55   Temp 97.6 F (36.4 C) (Oral)   Resp 13   Ht 4\' 11"  (1.499 m)   Wt 45 kg    SpO2 94%   BMI 20.04 kg/m   Physical Exam Vitals and nursing note reviewed.  Constitutional:      General: She is not in acute distress.    Appearance: She is well-developed. She is not diaphoretic.  HENT:     Head: Normocephalic and atraumatic.  Eyes:     General: No scleral icterus.       Right eye: No discharge.        Left eye: No discharge.     Conjunctiva/sclera: Conjunctivae normal.  Cardiovascular:     Rate and Rhythm: Normal rate and regular rhythm.     Pulses: Normal pulses.     Heart sounds: Normal heart sounds.  Pulmonary:     Effort: Pulmonary effort is normal. No respiratory distress.     Breath sounds: Normal breath sounds. No stridor.  Chest:     Chest wall: Tenderness (Right sided chest anterior lateral) present. No deformity or crepitus.  Abdominal:     General: Abdomen is flat. Bowel sounds are normal. There is no distension.     Tenderness: There is no abdominal tenderness. There is no guarding.  Musculoskeletal:        General: No deformity.     Cervical back: No rigidity or tenderness.     Right lower leg: No edema.     Left lower leg: No edema.     Comments: There is tenderness to palpation in the left hip and pelvis.  Patient is unable to straighten her legs fully due to her reported chronic back pain which is reportedly unchanged.  Skin:    General: Skin is warm and dry.  Neurological:     General: No focal deficit present.     Mental Status: She is alert and oriented to person, place, and time. Mental status is at baseline.     Motor: No abnormal muscle tone.  Psychiatric:        Mood and Affect: Mood normal.        Behavior: Behavior normal.     ED Results / Procedures / Treatments   Labs (all labs ordered are listed, but only abnormal results are displayed) Labs Reviewed  COMPREHENSIVE METABOLIC PANEL - Abnormal; Notable for the following components:      Result Value   Glucose, Bld 120 (*)  BUN 7 (*)    Calcium 8.6 (*)    Total  Protein 5.8 (*)    Albumin 3.2 (*)    AST 13 (*)    Total Bilirubin 0.2 (*)    GFR calc non Af Amer 60 (*)    All other components within normal limits  CBC WITH DIFFERENTIAL/PLATELET - Abnormal; Notable for the following components:   RBC 3.83 (*)    Hemoglobin 9.9 (*)    HCT 33.7 (*)    MCH 25.8 (*)    MCHC 29.4 (*)    RDW 15.9 (*)    All other components within normal limits  URINALYSIS, ROUTINE W REFLEX MICROSCOPIC - Abnormal; Notable for the following components:   Ketones, ur 20 (*)    All other components within normal limits  SARS CORONAVIRUS 2 (TAT 6-24 HRS)  CK  TROPONIN I (HIGH SENSITIVITY)    EKG EKG Interpretation  Date/Time:  Friday February 03 2020 18:30:56 EST Ventricular Rate:  61 PR Interval:    QRS Duration: 85 QT Interval:  556 QTC Calculation: 561 R Axis:   66 Text Interpretation: Sinus rhythm Nonspecific T abnormalities, diffuse leads Prolonged QT interval No STEMI Confirmed by Octaviano Glow [phone]) on 02/03/2020 6:57:31 PM   Radiology DG Ribs Unilateral W/Chest Right  Result Date: 02/03/2020 CLINICAL DATA:  Fall with right-sided chest pain EXAM: RIGHT RIBS AND CHEST - 3+ VIEW COMPARISON:  04/22/2019, CT 10/14/2019 FINDINGS: Single-view chest demonstrates emphysematous disease. Pleural effusions are better seen on chest CT. Irregular consolidation right lung base. Normal heart size. Aortic atherosclerosis. No pneumothorax. Branching calcification at the right hilus, appears to be intravascular on CT and related to pulmonary artery, no change since 10/14/2019. Right rib series demonstrates no acute displaced right rib fracture. IMPRESSION: 1. No definite acute displaced right rib fracture.  No pneumothorax. 2. Irregular bandlike consolidations at the right lung base corresponding to CT consolidations. Electronically Signed   By: Donavan Foil M.D.   On: 02/03/2020 22:16   CT Head Wo Contrast  Result Date: 02/03/2020 CLINICAL DATA:  Syncope, fell forward  EXAM: CT HEAD WITHOUT CONTRAST TECHNIQUE: Contiguous axial images were obtained from the base of the skull through the vertex without intravenous contrast. COMPARISON:  10/31/2019 FINDINGS: Brain: Stable chronic small-vessel ischemic changes throughout the periventricular white matter. No acute infarct or hemorrhage. Lateral ventricles and midline structures are unremarkable. No acute extra-axial fluid collections. No mass effect. Vascular: No hyperdense vessel or unexpected calcification. Skull: Normal. Negative for fracture or focal lesion. Sinuses/Orbits: Minimal polypoid mucosal thickening right maxillary sinus, stable. Otherwise the sinuses are clear. Other: None IMPRESSION: 1. Chronic small vessel ischemic changes.  No acute process. Electronically Signed   By: Randa Ngo M.D.   On: 02/03/2020 21:46   CT Chest Wo Contrast  Result Date: 02/03/2020 CLINICAL DATA:  Trauma, fall sternal tenderness EXAM: CT CHEST WITHOUT CONTRAST TECHNIQUE: Multidetector CT imaging of the chest was performed following the standard protocol without IV contrast. COMPARISON:  Radiograph 02/03/2020, CT chest 10/14/2019, CT 07/04/2019, PET CT 06/28/2018, 02/18/2019 FINDINGS: Cardiovascular: Limited evaluation without intravenous contrast. Moderate aortic atherosclerosis. No aneurysmal dilatation. Coronary vascular calcification. No pericardial effusion. Mediastinum/Nodes: Midline trachea. No thyroid mass. No suspicious adenopathy. Esophagus is within normal limits. Lungs/Pleura: Emphysema. Small bilateral pleural effusions. Progressive masslike consolidation in the right lower lobe, now measuring 6.2 x 4.2 cm, series 8 image number 109. Architectural distortion in this region. Consolidation has become more confluent in the interim. Areas of bandlike density  remain. Mild ground-glass density the periphery. Upper Abdomen: No acute abnormality. Musculoskeletal: No acute fracture or malalignment is seen. The sternum is intact.  IMPRESSION: 1. Negative for pneumothorax or rib fracture. 2. Small bilateral pleural effusions 3. Interval progression and increased size of masslike consolidation at the right lower lobe with associated architectural distortion, possibly due to continued evolution of post radiation change though continued short interval surveillance is recommend to exclude tumor recurrence given the increased size of consolidative masslike region. 4. Emphysema Aortic Atherosclerosis (ICD10-I70.0) and Emphysema (ICD10-J43.9). Electronically Signed   By: Donavan Foil M.D.   On: 02/03/2020 22:11   CT Cervical Spine Wo Contrast  Result Date: 02/03/2020 CLINICAL DATA:  Syncope, fell forward EXAM: CT CERVICAL SPINE WITHOUT CONTRAST TECHNIQUE: Multidetector CT imaging of the cervical spine was performed without intravenous contrast. Multiplanar CT image reconstructions were also generated. COMPARISON:  10/31/2019 FINDINGS: Alignment: Alignment is grossly anatomic and stable. Skull base and vertebrae: No acute displaced fractures. Soft tissues and spinal canal: No prevertebral fluid or swelling. No visible canal hematoma. Disc levels: Stable multilevel cervical spondylosis, with disc space narrowing and osteophyte formation most pronounced at C5-6 and C6-7. Upper chest: Significant emphysema within the upper lobes. Airway is patent. Other: Reconstructed images demonstrate no additional findings. IMPRESSION: 1. Stable cervical spondylosis.  No acute fracture. Electronically Signed   By: Randa Ngo M.D.   On: 02/03/2020 21:48   DG Hip Unilat With Pelvis 2-3 Views Left  Result Date: 02/03/2020 CLINICAL DATA:  Fall EXAM: DG HIP (WITH OR WITHOUT PELVIS) 2-3V LEFT COMPARISON:  CT 10/31/2019 FINDINGS: Pubic symphysis and rami appear intact. No fracture or malalignment. Joint space is maintained. IMPRESSION: No acute osseous abnormality. Electronically Signed   By: Donavan Foil M.D.   On: 02/03/2020 22:17    Procedures Procedures  (including critical care time)  Medications Ordered in ED Medications  sodium chloride 0.9 % bolus 500 mL (0 mLs Intravenous Stopped 02/03/20 2242)  traMADol (ULTRAM) tablet 50 mg (50 mg Oral Given 02/03/20 2025)    ED Course  I have reviewed the triage vital signs and the nursing notes.  Pertinent labs & imaging results that were available during my care of the patient were reviewed by me and considered in my medical decision making (see chart for details).  Clinical Course as of Feb 03 126  Fri Feb 03, 2020  2020 I was informed that patient is refusing to get all of her x-rays until she gets pain medicine.  She did not mention needing pain medicine to me when I evaluated her earlier.  Chart review shows she takes tramadol at home.  Home dose of tramadol ordered.   [EH]  2246 Plan for obs admission for syncope vs frequent falls.  Concerned there may be an element of polypharmacy as she is on tramadol, trazadone, and lyrica at home.  She appears groggy now.  Says she doesn't think she can walk witohut assistance.  I think she needs a PT evaluation and CM involvement as well to help her get set up with home health aide, as she lives alone, and her son only stops by to visit   [MT]  2247 Not sure where this hypoxia is coming from, her O2 has been 100% on room air while I've been in the room, but nursing notes 86% with good pleth requiring 3 L [place]   [MT]  2340 Signed out to hospitalist   [MT]    Clinical Course User Index [EH] Phylliss Bob,  Ree Shay, PA-C [MT] Langston Masker, Carola Rhine, MD   MDM Rules/Calculators/A&P                     Patient presents today for evaluation after a fall.  Fall occurred at home.  Patient is unsure what caused her to fall and thinks she was unconscious for about 5 minutes.  Patient has chronic back pain which is unchanged from her baseline.  She does not take any anticoagulants.  CT head and neck is ordered given her age and fall.  In addition rib x-rays of the right  side, which were changed to CT chest, and pelvis x-rays/left hip were ordered.  Given possible for syncopal event labs are obtained  CBC and CMP appear consistent with her baseline.  Her hemoglobin is 9.9, however review of labs over the past year show that this appears to be roughly consistent with her baseline.  Troponin is not significantly elevated.  EKG without evidence of ischemia.  At this time imaging, UA are pending.  At shift change care was transferred to Dr. Oneta Rack who will follow pending studies, re-evaulate and determine disposition.     Final Clinical Impression(s) / ED Diagnoses Final diagnoses:  Fall, initial encounter  Right-sided chest pain    Rx / DC Orders ED Discharge Orders    None       Ollen Gross 02/03/20 2113    Wyvonnia Dusky, MD 02/03/20 2257    Wyvonnia Dusky, MD 02/04/20 [phone]

## 2020-02-03 NOTE — ED Triage Notes (Signed)
Came in via EMS; reported patient was walking and she passed out; falling forward. Stated she is not on any blood thinners and "I think I passed out for about 5 minutes."

## 2020-02-03 NOTE — ED Notes (Signed)
Pt made aware of need for urine sample, pure wick placed.

## 2020-02-03 NOTE — H&P (Signed)
History and Physical   Heather [name] DQQ:[id number] DOB: 1935-05-04 DOA: 02/03/2020  Referring MD/NP/PA: None  PCP: Marrian Salvage, [place]   Outpatient Specialists: Wyn Quaker, PA   Patient coming from: Home  Chief Complaint: Confusion  HPI: Heather [name] is a 85 y.o. female with medical history significant of COPD, depression, coronary artery disease, hyperlipidemia, hypertension, hypothyroidism, irritable bowel syndrome, Crohn's disease with polypharmacy who presented with sudden onset of syncope at home.  Patient says she passed out up to 5 minutes at home.  She was walking at the time she fell forward.  She denied hitting her head.  Denied any injury.  Patient has had multiple falls lately.  She denied any gait abnormalities.  Her oxygen has been fine.  Patient was seen and evaluated.  Work-up so far has been uneventful.  She has known history of coronary artery disease with possible arrhythmias.  At this point however she has not had any work-up including echocardiogram for a long time.  Patient is on multiple medications and polypharmacy suspected at this point.  She is being admitted for work-up.  She is not on any blood thinners.  No bone damage or joint issues at this point.  Patient's oxygen sats have been dropping into the 80s on room air and requires up to 3 L of oxygen now..  ED Course: Temperature 97.6 blood pressure 127/48 pulse 63 but dropped into the 40s oxygen sats 86% with respiratory rate of 18.  White count 5.8 hemoglobin 9.8 and platelets of 358.  Chemistry otherwise appears to be within normal except for calcium of 8.6.  All imaging studies showed no obvious findings.  Patient being admitted for syncopal work-up  Review of Systems: As per HPI otherwise 10 point review of systems negative.    Past Medical History:  Diagnosis Date  . ANXIETY 07/23/2009  . Atherosclerosis of aorta ([place]) 07/24/2015  . Complication of anesthesia 7 or 8 yrs ago   woke  up during colonscopy  . COPD (chronic obstructive pulmonary disease) ([place])   . Coronary atherosclerosis 07/24/2015  . DEPRESSION 07/23/2009  . DIVERTICULOSIS, COLON 07/23/2009  . GERD 07/23/2009  . Headache(784.0) 07/23/2009   occasional  . Hemorrhoids   . HIP PAIN, LEFT 05/16/2010  . History of Crohn's disease   . HYPERLIPIDEMIA 07/23/2009  . HYPERTENSION 07/23/2009  . HYPOTHYROIDISM 07/23/2009  . IBS (irritable bowel syndrome)   . Insomnia   . LOW BACK PAIN, CHRONIC 10/01/2009  . PARESTHESIA 10/01/2009  . Shingles 2006   back    Past Surgical History:  Procedure Laterality Date  . ABDOMINAL HYSTERECTOMY  age 75 or 75  . APPENDECTOMY  yrs ago  . BILATERAL SALPINGOOPHORECTOMY  age 76 or 57  . COLONOSCOPY N/A 11/17/2013   Procedure: COLONOSCOPY;  Surgeon: Cleotis Nipper, MD;  Location: [facility];  Service: Endoscopy;  Laterality: N/A;  . ESOPHAGOGASTRODUODENOSCOPY N/A 11/17/2013   Procedure: ESOPHAGOGASTRODUODENOSCOPY (EGD);  Surgeon: Cleotis Nipper, MD;  Location: [facility];  Service: Endoscopy;  Laterality: N/A;  . HEMORRHOID SURGERY  yrs ago  . TONSILLECTOMY  yrs ago     reports that she quit smoking about 21 years ago. Her smoking use included cigarettes. She started smoking about 59 years ago. She smoked 0.50 packs per day. She has never used smokeless tobacco. She reports that she does not drink alcohol or use drugs.  Allergies  Allergen Reactions  . Tylenol [Acetaminophen] Other (See Comments)    Nightmares  . Symbicort [  Budesonide-Formoterol Fumarate] Other (See Comments)    Pt felt like her tongue was swollen, could not swallow    Family History  Problem Relation Age of Onset  . Cancer Mother        Unknown type  . Cancer Sister        Breast  . Clotting disorder Neg Hx      Prior to Admission medications   Medication Sig Start Date End Date Taking? Authorizing Provider  Albuterol Sulfate (PROAIR RESPICLICK) 726 (90 Base) MCG/ACT AEPB Inhale 2 puffs  into the lungs 4 (four) times daily as needed. Patient taking differently: Inhale 2 puffs into the lungs 4 (four) times daily as needed (sob and wheezing).  01/26/18   [name], MD  benzonatate (TESSALON) 100 MG capsule TAKE 1 CAPSULE TWICE DAILY AS NEEDED FOR COUGH. 11/01/19   Marrian Salvage, FNP  BREO ELLIPTA 200-25 MCG/INH AEPB INHALE 1 PUFF INTO THE LUNGS ONCE DAILY. 05/09/19   Marrian Salvage, FNP  clorazepate (TRANXENE) 15 MG tablet TAKE (1) TABLET TWICE DAILY AS NEEDED FOR ANXIETY. 01/11/20   Marrian Salvage, FNP  diltiazem (CARDIZEM CD) 240 MG 24 hr capsule TAKE (1) CAPSULE DAILY. 11/21/19   Marrian Salvage, FNP  esomeprazole (NEXIUM) 40 MG capsule TAKE 1 CAPSULE DAILY BEFORE BREAKFAST. 11/21/19   Marrian Salvage, FNP  folic acid (FOLVITE) 1 MG tablet TAKE 1 TABLET ONCE DAILY. 11/21/19   Marrian Salvage, FNP  furosemide (LASIX) 20 MG tablet TAKE 1 TABLET DAILY IF NEEDED FOR SIGNIFICANT LOWER EXTREMITY SWELLING. 01/19/19   Marrian Salvage, FNP  levothyroxine (SYNTHROID) 25 MCG tablet TAKE 1 TABLET DAILY BEFORE BREAKFAST. 05/25/19   Marrian Salvage, FNP  Multiple Vitamin (MULTIVITAMIN WITH MINERALS) TABS tablet Take 1 tablet by mouth daily. 03/14/18   Roxan Hockey, MD  MYRBETRIQ 25 MG TB24 tablet TAKE 1 TABLET ONCE DAILY. 12/16/19   Marrian Salvage, FNP  nitrofurantoin, macrocrystal-monohydrate, (MACROBID) 100 MG capsule Take 1 capsule (100 mg total) by mouth 2 (two) times daily. 06/14/19   Marrian Salvage, FNP  Polyethyl Glycol-Propyl Glycol (SYSTANE OP) Place 1 drop into both eyes 2 (two) times daily as needed (for dry eyes).     [provider]  potassium chloride SA (K-DUR) 20 MEQ tablet Take 1 tablet (20 mEq total) by mouth daily. 06/22/19   Marrian Salvage, FNP  pregabalin (LYRICA) 25 MG capsule Take 1 capsule (25 mg total) by mouth 2 (two) times daily. 12/28/19   Raulkar, Clide Deutscher, MD  promethazine  (PHENERGAN) 25 MG tablet TAKE 1 TABLET EVERY 8 HOURS AS NEEDED FOR NAUSEA & VOMITING. 09/07/19   Marrian Salvage, FNP  senna-docusate (SENOKOT-S) 8.6-50 MG tablet Take 2 tablets by mouth at bedtime. 03/13/18   Roxan Hockey, MD  sertraline (ZOLOFT) 50 MG tablet TAKE 1 TABLET EACH DAY. 11/21/19   Marrian Salvage, FNP  traMADol Veatrice Bourbon) 50 MG tablet Take 1-2 tablets every 4-6 hours prn pain 12/28/19   Raulkar, Clide Deutscher, MD  traZODone (DESYREL) 150 MG tablet Take 1 tablet (150 mg total) by mouth at bedtime. 12/28/19   Raulkar, Clide Deutscher, MD  zolpidem (AMBIEN) 5 MG tablet TAKE 1 TABLET AT BEDTIME AS NEEDED. 07/18/19   Marrian Salvage, FNP  pregabalin (LYRICA) 25 MG capsule Take 1 capsule (25 mg total) by mouth daily. 11/10/19   Izora Ribas, MD    Physical Exam: Vitals:   02/03/20 2054 02/03/20 2100 02/03/20 2200 02/03/20  2300  BP:  (!) 110/48 (!) 127/48 112/75  Pulse: (!) 56 (!) 54 (!) 49 63  Resp: 12 18 12 18   Temp:      TempSrc:      SpO2: (!) 86% 100% 100% 95%  Weight:      Height:          Constitutional: Morbidly obese, weak Vitals:   02/03/20 2054 02/03/20 2100 02/03/20 2200 02/03/20 2300  BP:  (!) 110/48 (!) 127/48 112/75  Pulse: (!) 56 (!) 54 (!) 49 63  Resp: 12 18 12 18   Temp:      TempSrc:      SpO2: (!) 86% 100% 100% 95%  Weight:      Height:       Eyes: PERRL, lids and conjunctivae normal ENMT: Mucous membranes are dry. Posterior pharynx clear of any exudate or lesions.Normal dentition.  Neck: normal, supple, no masses, no thyromegaly Respiratory: clear to auscultation bilaterally, no wheezing, no crackles. Normal respiratory effort. No accessory muscle use.  Cardiovascular: Bradycardia no murmurs / rubs / gallops. No extremity edema. 2+ pedal pulses. No carotid bruits.  Abdomen: no tenderness, no masses palpated. No hepatosplenomegaly. Bowel sounds positive.  Musculoskeletal: no clubbing / cyanosis. No joint deformity upper and lower  extremities. Good ROM, no contractures. Normal muscle tone.  Skin: no rashes, lesions, ulcers. No induration Neurologic: CN 2-12 grossly intact. Sensation intact, DTR normal. Strength 5/5 in all 4.  Psychiatric: Normal judgment and insight. Alert and oriented x 3. Normal mood.     Labs on Admission: I have personally reviewed following labs and imaging studies  CBC: Recent Labs  Lab 02/03/20 1815  WBC 5.9  NEUTROABS 4.2  HGB 9.9*  HCT 33.7*  MCV 88.0  PLT 622   Basic Metabolic Panel: Recent Labs  Lab 02/03/20 1815  NA 136  K 3.5  CL 101  CO2 22  GLUCOSE 120*  BUN 7*  CREATININE 0.90  CALCIUM 8.6*   GFR: Estimated Creatinine Clearance: 32.9 mL/min (by C-G formula based on SCr of 0.9 mg/dL). Liver Function Tests: Recent Labs  Lab 02/03/20 1815  AST 13*  ALT 9  ALKPHOS 76  BILITOT 0.2*  PROT 5.8*  ALBUMIN 3.2*   No results for input(s): LIPASE, AMYLASE in the last 168 hours. No results for input(s): AMMONIA in the last 168 hours. Coagulation Profile: No results for input(s): INR, PROTIME in the last 168 hours. Cardiac Enzymes: No results for input(s): CKTOTAL, CKMB, CKMBINDEX, TROPONINI in the last 168 hours. BNP (last 3 results) No results for input(s): PROBNP in the last 8760 hours. HbA1C: No results for input(s): HGBA1C in the last 72 hours. CBG: No results for input(s): GLUCAP in the last 168 hours. Lipid Profile: No results for input(s): CHOL, HDL, LDLCALC, TRIG, CHOLHDL, LDLDIRECT in the last 72 hours. Thyroid Function Tests: No results for input(s): TSH, T4TOTAL, FREET4, T3FREE, THYROIDAB in the last 72 hours. Anemia Panel: No results for input(s): VITAMINB12, FOLATE, FERRITIN, TIBC, IRON, RETICCTPCT in the last 72 hours. Urine analysis:    Component Value Date/Time   COLORURINE YELLOW 02/03/2020 2241   APPEARANCEUR CLEAR 02/03/2020 2241   LABSPEC 1.015 02/03/2020 2241   PHURINE 6.0 02/03/2020 2241   GLUCOSEU NEGATIVE 02/03/2020 2241    GLUCOSEU NEGATIVE 06/15/2019 1324   HGBUR NEGATIVE 02/03/2020 2241   HGBUR trace-lysed 11/15/2010 1046   BILIRUBINUR NEGATIVE 02/03/2020 2241   KETONESUR 20 (A) 02/03/2020 2241   PROTEINUR NEGATIVE 02/03/2020 2241   UROBILINOGEN 0.2  06/15/2019 1324   NITRITE NEGATIVE 02/03/2020 2241   LEUKOCYTESUR NEGATIVE 02/03/2020 2241   Sepsis Labs: @LABRCNTIP (procalcitonin:4,lacticidven:4) )No results found for this or any previous visit (from the past 240 hour(s)).   Radiological Exams on Admission: DG Ribs Unilateral W/Chest Right  Result Date: 02/03/2020 CLINICAL DATA:  Fall with right-sided chest pain EXAM: RIGHT RIBS AND CHEST - 3+ VIEW COMPARISON:  04/22/2019, CT 10/14/2019 FINDINGS: Single-view chest demonstrates emphysematous disease. Pleural effusions are better seen on chest CT. Irregular consolidation right lung base. Normal heart size. Aortic atherosclerosis. No pneumothorax. Branching calcification at the right hilus, appears to be intravascular on CT and related to pulmonary artery, no change since 10/14/2019. Right rib series demonstrates no acute displaced right rib fracture. IMPRESSION: 1. No definite acute displaced right rib fracture.  No pneumothorax. 2. Irregular bandlike consolidations at the right lung base corresponding to CT consolidations. Electronically Signed   By: Donavan Foil M.D.   On: 02/03/2020 22:16   CT Head Wo Contrast  Result Date: 02/03/2020 CLINICAL DATA:  Syncope, fell forward EXAM: CT HEAD WITHOUT CONTRAST TECHNIQUE: Contiguous axial images were obtained from the base of the skull through the vertex without intravenous contrast. COMPARISON:  10/31/2019 FINDINGS: Brain: Stable chronic small-vessel ischemic changes throughout the periventricular white matter. No acute infarct or hemorrhage. Lateral ventricles and midline structures are unremarkable. No acute extra-axial fluid collections. No mass effect. Vascular: No hyperdense vessel or unexpected calcification.  Skull: Normal. Negative for fracture or focal lesion. Sinuses/Orbits: Minimal polypoid mucosal thickening right maxillary sinus, stable. Otherwise the sinuses are clear. Other: None IMPRESSION: 1. Chronic small vessel ischemic changes.  No acute process. Electronically Signed   By: Randa Ngo M.D.   On: 02/03/2020 21:46   CT Chest Wo Contrast  Result Date: 02/03/2020 CLINICAL DATA:  Trauma, fall sternal tenderness EXAM: CT CHEST WITHOUT CONTRAST TECHNIQUE: Multidetector CT imaging of the chest was performed following the standard protocol without IV contrast. COMPARISON:  Radiograph 02/03/2020, CT chest 10/14/2019, CT 07/04/2019, PET CT 06/28/2018, 02/18/2019 FINDINGS: Cardiovascular: Limited evaluation without intravenous contrast. Moderate aortic atherosclerosis. No aneurysmal dilatation. Coronary vascular calcification. No pericardial effusion. Mediastinum/Nodes: Midline trachea. No thyroid mass. No suspicious adenopathy. Esophagus is within normal limits. Lungs/Pleura: Emphysema. Small bilateral pleural effusions. Progressive masslike consolidation in the right lower lobe, now measuring 6.2 x 4.2 cm, series 8 image number 109. Architectural distortion in this region. Consolidation has become more confluent in the interim. Areas of bandlike density remain. Mild ground-glass density the periphery. Upper Abdomen: No acute abnormality. Musculoskeletal: No acute fracture or malalignment is seen. The sternum is intact. IMPRESSION: 1. Negative for pneumothorax or rib fracture. 2. Small bilateral pleural effusions 3. Interval progression and increased size of masslike consolidation at the right lower lobe with associated architectural distortion, possibly due to continued evolution of post radiation change though continued short interval surveillance is recommend to exclude tumor recurrence given the increased size of consolidative masslike region. 4. Emphysema Aortic Atherosclerosis (ICD10-I70.0) and Emphysema  (ICD10-J43.9). Electronically Signed   By: Donavan Foil M.D.   On: 02/03/2020 22:11   CT Cervical Spine Wo Contrast  Result Date: 02/03/2020 CLINICAL DATA:  Syncope, fell forward EXAM: CT CERVICAL SPINE WITHOUT CONTRAST TECHNIQUE: Multidetector CT imaging of the cervical spine was performed without intravenous contrast. Multiplanar CT image reconstructions were also generated. COMPARISON:  10/31/2019 FINDINGS: Alignment: Alignment is grossly anatomic and stable. Skull base and vertebrae: No acute displaced fractures. Soft tissues and spinal canal: No prevertebral fluid or swelling. No  visible canal hematoma. Disc levels: Stable multilevel cervical spondylosis, with disc space narrowing and osteophyte formation most pronounced at C5-6 and C6-7. Upper chest: Significant emphysema within the upper lobes. Airway is patent. Other: Reconstructed images demonstrate no additional findings. IMPRESSION: 1. Stable cervical spondylosis.  No acute fracture. Electronically Signed   By: Randa Ngo M.D.   On: 02/03/2020 21:48   DG Hip Unilat With Pelvis 2-3 Views Left  Result Date: 02/03/2020 CLINICAL DATA:  Fall EXAM: DG HIP (WITH OR WITHOUT PELVIS) 2-3V LEFT COMPARISON:  CT 10/31/2019 FINDINGS: Pubic symphysis and rami appear intact. No fracture or malalignment. Joint space is maintained. IMPRESSION: No acute osseous abnormality. Electronically Signed   By: Donavan Foil M.D.   On: 02/03/2020 22:17    EKG: Independently reviewed.  It shows sinus rhythm with occasional bradycardia.  Flipped T waves in the lateral leads.  Appears to be chronic.  Assessment/Plan Principal Problem:   Syncope and collapse Active Problems:   Hypothyroidism   Anxiety and depression   Essential hypertension   GERD   Hypoxia   COPD with emphysema ([place])     #1 syncope: Patient will be admitted and work-up will include echocardiogram and close monitoring.  Suspected polypharmacy.  Patient has otherwise appears stable.  We will  continue to monitor.  #2 polypharmacy: Hold most medications for now.  Review and initiate in the morning once confirmed med rec by pharmacy.  #3 hypothyroidism: We will resume levothyroxine  #4 COPD with hypoxia: No significant exacerbation but patient appears to be hypoxic here.  Will likely need home O2.  This may be responsible for her recurrent falls if she becomes hypoxic.  #5 essential hypertension: Monitor blood pressure and resume home medications once med rec is completed  #6 coronary artery disease: Stable.  Does not appear to have acute coronary syndrome.  Check CK.  Troponin so far negative.   DVT prophylaxis: Lovenox Code Status: DNR Family Communication: No family at bedside Disposition Plan: Home Consults called: None but will require PT and OT consult Admission status: Inpatient  Severity of Illness: The appropriate patient status for this patient is INPATIENT. Inpatient status is judged to be reasonable and necessary in order to provide the required intensity of service to ensure the patient's safety. The patient's presenting symptoms, physical exam findings, and initial radiographic and laboratory data in the context of their chronic comorbidities is felt to place them at high risk for further clinical deterioration. Furthermore, it is not anticipated that the patient will be medically stable for discharge from the hospital within 2 midnights of admission. The following factors support the patient status of inpatient.   " The patient's presenting symptoms include fall with syncope. " The worrisome physical exam findings include no significant findings. " The initial radiographic and laboratory data are worrisome because of normal labs for the most part. " The chronic co-morbidities include COPD.   * I certify that at the point of admission it is my clinical judgment that the patient will require inpatient hospital care spanning beyond 2 midnights from the point of  admission due to high intensity of service, high risk for further deterioration and high frequency of surveillance required.Barbette Merino MD [facility] Pager [phone]  If 7PM-7AM, please contact night-coverage [email or web address] Password [facility]  02/03/2020, 11:58 PM

## 2020-02-04 ENCOUNTER — Other Ambulatory Visit (HOSPITAL_COMMUNITY): Payer: Medicare Other

## 2020-02-04 ENCOUNTER — Inpatient Hospital Stay (HOSPITAL_COMMUNITY): Payer: Medicare Other

## 2020-02-04 DIAGNOSIS — I361 Nonrheumatic tricuspid (valve) insufficiency: Secondary | ICD-10-CM

## 2020-02-04 DIAGNOSIS — I340 Nonrheumatic mitral (valve) insufficiency: Secondary | ICD-10-CM

## 2020-02-04 LAB — CBC WITH DIFFERENTIAL/PLATELET
Abs Immature Granulocytes: 0.02 10*3/uL (ref 0.00–0.07)
Basophils Absolute: 0 10*3/uL (ref 0.0–0.1)
Basophils Relative: 1 %
Eosinophils Absolute: 0.1 10*3/uL (ref 0.0–0.5)
Eosinophils Relative: 1 %
HCT: 32.3 % — ABNORMAL LOW (ref 36.0–46.0)
Hemoglobin: 9.6 g/dL — ABNORMAL LOW (ref 12.0–15.0)
Immature Granulocytes: 0 %
Lymphocytes Relative: 22 %
Lymphs Abs: 1.3 10*3/uL (ref 0.7–4.0)
MCH: 25.5 pg — ABNORMAL LOW (ref 26.0–34.0)
MCHC: 29.7 g/dL — ABNORMAL LOW (ref 30.0–36.0)
MCV: 85.9 fL (ref 80.0–100.0)
Monocytes Absolute: 0.6 10*3/uL (ref 0.1–1.0)
Monocytes Relative: 10 %
Neutro Abs: 4 10*3/uL (ref 1.7–7.7)
Neutrophils Relative %: 66 %
Platelets: 322 10*3/uL (ref 150–400)
RBC: 3.76 MIL/uL — ABNORMAL LOW (ref 3.87–5.11)
RDW: 15.8 % — ABNORMAL HIGH (ref 11.5–15.5)
WBC: 6 10*3/uL (ref 4.0–10.5)
nRBC: 0 % (ref 0.0–0.2)

## 2020-02-04 LAB — COMPREHENSIVE METABOLIC PANEL
ALT: 5 U/L (ref 0–44)
AST: 16 U/L (ref 15–41)
Albumin: 3 g/dL — ABNORMAL LOW (ref 3.5–5.0)
Alkaline Phosphatase: 69 U/L (ref 38–126)
Anion gap: 9 (ref 5–15)
BUN: <5 mg/dL — ABNORMAL LOW (ref 8–23)
CO2: 25 mmol/L (ref 22–32)
Calcium: 8.5 mg/dL — ABNORMAL LOW (ref 8.9–10.3)
Chloride: 103 mmol/L (ref 98–111)
Creatinine, Ser: 0.75 mg/dL (ref 0.44–1.00)
GFR calc Af Amer: >60 mL/min (ref >60)
GFR calc non Af Amer: >60 mL/min (ref >60)
Glucose, Bld: 128 mg/dL — ABNORMAL HIGH (ref 70–99)
Potassium: 3.2 mmol/L — ABNORMAL LOW (ref 3.5–5.1)
Sodium: 137 mmol/L (ref 135–145)
Total Bilirubin: 0.4 mg/dL (ref 0.3–1.2)
Total Protein: 5.8 g/dL — ABNORMAL LOW (ref 6.5–8.1)

## 2020-02-04 LAB — ECHOCARDIOGRAM COMPLETE
Height: 59 in
Weight: 1587.31 oz

## 2020-02-04 LAB — CK: Total CK: 31 U/L — ABNORMAL LOW (ref 38–234)

## 2020-02-04 LAB — SARS CORONAVIRUS 2 (TAT 6-24 HRS): SARS Coronavirus 2: NEGATIVE

## 2020-02-04 LAB — TSH: TSH: 4.847 u[IU]/mL — ABNORMAL HIGH (ref 0.350–4.500)

## 2020-02-04 MED ORDER — HALOPERIDOL LACTATE 5 MG/ML IJ SOLN
2.5000 mg | Freq: Four times a day (QID) | INTRAMUSCULAR | Status: DC | PRN
  Administered 2020-02-04: 2.5 mg via INTRAVENOUS
  Filled 2020-02-04: qty 1

## 2020-02-04 MED ORDER — SENNOSIDES-DOCUSATE SODIUM 8.6-50 MG PO TABS
2.0000 | ORAL_TABLET | Freq: Every day | ORAL | Status: DC
  Administered 2020-02-04 – 2020-02-07 (×4): 2 via ORAL
  Filled 2020-02-04 (×4): qty 2

## 2020-02-04 MED ORDER — SODIUM CHLORIDE 0.9 % IV SOLN: INTRAVENOUS | Status: DC

## 2020-02-04 MED ORDER — FOLIC ACID 1 MG PO TABS
1.0000 mg | ORAL_TABLET | Freq: Every day | ORAL | Status: DC
  Administered 2020-02-04 – 2020-02-08 (×5): 1 mg via ORAL
  Filled 2020-02-04 (×5): qty 1

## 2020-02-04 MED ORDER — POTASSIUM CHLORIDE CRYS ER 20 MEQ PO TBCR
40.0000 meq | EXTENDED_RELEASE_TABLET | Freq: Once | ORAL | Status: AC
  Administered 2020-02-04: 40 meq via ORAL
  Filled 2020-02-04: qty 2

## 2020-02-04 MED ORDER — PANTOPRAZOLE SODIUM 40 MG PO TBEC
40.0000 mg | DELAYED_RELEASE_TABLET | Freq: Every day | ORAL | Status: DC
  Administered 2020-02-04 – 2020-02-08 (×5): 40 mg via ORAL
  Filled 2020-02-04 (×5): qty 1

## 2020-02-04 MED ORDER — ONDANSETRON HCL 4 MG PO TABS
4.0000 mg | ORAL_TABLET | Freq: Four times a day (QID) | ORAL | Status: DC | PRN
  Administered 2020-02-06 – 2020-02-07 (×2): 4 mg via ORAL
  Filled 2020-02-04 (×2): qty 1

## 2020-02-04 MED ORDER — SODIUM CHLORIDE 0.45 % IV SOLN: INTRAVENOUS | Status: DC

## 2020-02-04 MED ORDER — ENOXAPARIN SODIUM 40 MG/0.4ML ~~LOC~~ SOLN
40.0000 mg | SUBCUTANEOUS | Status: DC
  Administered 2020-02-04 – 2020-02-08 (×5): 40 mg via SUBCUTANEOUS
  Filled 2020-02-04 (×5): qty 0.4

## 2020-02-04 MED ORDER — LEVOTHYROXINE SODIUM 25 MCG PO TABS
25.0000 ug | ORAL_TABLET | Freq: Every day | ORAL | Status: DC
  Administered 2020-02-04 – 2020-02-08 (×5): 25 ug via ORAL
  Filled 2020-02-04 (×5): qty 1

## 2020-02-04 MED ORDER — FLUTICASONE FUROATE-VILANTEROL 200-25 MCG/INH IN AEPB
1.0000 | INHALATION_SPRAY | Freq: Every day | RESPIRATORY_TRACT | Status: DC
  Administered 2020-02-04 – 2020-02-08 (×5): 1 via RESPIRATORY_TRACT
  Filled 2020-02-04: qty 28

## 2020-02-04 MED ORDER — IBUPROFEN 400 MG PO TABS
400.0000 mg | ORAL_TABLET | Freq: Four times a day (QID) | ORAL | Status: DC | PRN
  Filled 2020-02-04: qty 1

## 2020-02-04 MED ORDER — TRAMADOL HCL 50 MG PO TABS
50.0000 mg | ORAL_TABLET | Freq: Four times a day (QID) | ORAL | Status: DC | PRN
  Administered 2020-02-05 – 2020-02-08 (×8): 50 mg via ORAL
  Filled 2020-02-04 (×8): qty 1

## 2020-02-04 MED ORDER — ONDANSETRON HCL 4 MG/2ML IJ SOLN
4.0000 mg | Freq: Four times a day (QID) | INTRAMUSCULAR | Status: DC | PRN
  Filled 2020-02-04: qty 2

## 2020-02-04 NOTE — ED Notes (Signed)
Floor called x 3 without an answer.

## 2020-02-04 NOTE — ED Notes (Signed)
Breakfast ordered 

## 2020-02-04 NOTE — ED Notes (Signed)
Pt bed cleaned and changed, peri care and new pure wick attached.  Pt alert and in NAD, cooperative and grateful.  Slow, wuiet calm voice without pain responses with gross movement.  Breakfast given and pt able to self feed.

## 2020-02-04 NOTE — ED Notes (Signed)
Son Remo Lipps called with update POC on admission. Msg just seen.

## 2020-02-04 NOTE — ED Notes (Signed)
Patient denies pain and is resting comfortably. Patient is resting comfortably.

## 2020-02-04 NOTE — Progress Notes (Signed)
  Echocardiogram 2D Echocardiogram has been performed.  Heather [name] 02/04/2020, 1:32 PM

## 2020-02-04 NOTE — ED Notes (Signed)
Pt emphatic that she takes her meds as scheduled and fell using her walker on the carpet. Alert and oriented.  States motrin does not work for her pain.

## 2020-02-04 NOTE — Progress Notes (Signed)
Pt admitted to the unit from ED; pt oriented to the unit and room; VSS: telemetry applied and verified with CCMD; NT called to second verify. Skin intact with no pressure ulcer or opened wounds noted. Fall/safety precaution and prevention education completed with pt. Bed alarm and call light within reach. Will continue to closely monitor. Francis Gaines Quinnton Bury RN   02/04/20 1241  Vitals  Temp 98 F (36.7 C)  Temp Source Oral  BP (!) 132/57  BP Location Right Arm  Pulse Rate (!) 57  Pulse Rate Source Dinamap  Resp 20  Oxygen Therapy  SpO2 93 %  O2 Device Room Air  Pain Assessment  Pain Scale 0-10  Pain Score 0  MEWS Score  MEWS Temp 0  MEWS Systolic 0  MEWS Pulse 0  MEWS RR 0  MEWS LOC 0  MEWS Score 0  MEWS Score Color [name]

## 2020-02-04 NOTE — ED Notes (Signed)
Son Heather [name]. Would like an update. [phone].

## 2020-02-04 NOTE — Progress Notes (Signed)
PROGRESS NOTE    Heather [name]  WNU:[id number] DOB: [birth year] DOA: 02/03/2020 PCP: Marrian Salvage, FNP   Brief Narrative:  Patient is 85 year old female with history of COPD, depression, coronary artery disease, hyperlipidemia, hypertension, hypothyroidism, irritable bowel syndrome, Crohn's disease, on multiple sedating medications at home who presented with sudden onset of syncope.  Reported that she passed out up to 5 minutes at home.  She was walking at the time when she fell.  No trauma of the head or any other history injuries.  She was having multiple falls lately.  She was admitted for the management of syncope most likely secondary to polypharmacy.  On presentation she was hemodynamically stable but mildly bradycardic.  Assessment & Plan:   Principal Problem:   Syncope and collapse Active Problems:   Hypothyroidism   Anxiety and depression   Essential hypertension   GERD   Hypoxia   COPD with emphysema (HCC)   Syncope: Currently hemodynamically stable but bradycardic.  EKG on presentation did not show heart block .  We will follow-up echocardiogram.  Blood pressure stable.  She was on Cardizem at home which has been on hold.  Bradycardia: Could be responsible for syncope.  Cardizem held.  Follow-up echocardiogram to rule out structural heart defects.  Polypharmacy: On pregabalin, Phenergan, sertraline, tramadol, trazodone, zolpidem at home.  These medications have been held.  Hypothyroidism: Continue Synthyroid  COPD: Currently not in exacerbation.   Not on oxygen at home   hypertension: Currently blood stable.  Coronary artery disease: Stable.  Does not complain of any chest pain.  Troponins negative.  Frequent falls /debility/deconditioning: We will request a PT/OT evaluation.  She lives by herself.  Hypokalemia: Supplemented with potassium.  Chronic pain syndrome: On pain medications at home and complains of generalized pain.  Continue supportive  care         DVT prophylaxis: Lovenox Code Status: DNR Family Communication: called son on phone on 02/04/20 for update Disposition Plan: Patient is from home.  Needs PT/OT evaluation before discharge planning.  Awaiting echocardiogram   Consultants: None  Procedures: None  Antimicrobials:  Anti-infectives (From admission, onward)   None      Subjective: Patient seen and examined at bedside this morning.  Hemodynamically stable except for mild bradycardia.  Denies any chest pain or shortness of breath or any other complaints.  Looks chronically debilitated, very elderly.  Alert and oriented.  Objective: Vitals:   02/04/20 0530 02/04/20 0600 02/04/20 0700 02/04/20 0707  BP: 115/64 (!) 128/49 119/60 119/60  Pulse: (!) 55 86  (!) 45  Resp: 18 15  15   Temp:      TempSrc:      SpO2: 91% 95%  100%  Weight:      Height:       No intake or output data in the 24 hours ending 02/04/20 0851 Filed Weights   02/03/20 1814  Weight: 45 kg    Examination:  General exam: Elderly debilitated female HEENT:PERRL,Oral mucosa moist, Ear/Nose normal on gross exam Respiratory system: Diminished air sounds bilaterally, no wheezes or crackles Cardiovascular system: Sinus bradycardia. No JVD, murmurs, rubs, gallops or clicks. No pedal edema. Gastrointestinal system: Abdomen is nondistended, soft and nontender. No organomegaly or masses felt. Normal bowel sounds heard. Central nervous system: Alert and oriented. No focal neurological deficits. Extremities: No edema, no clubbing ,no cyanosis Skin: No rashes, lesions or ulcers,no icterus ,no pallor   Data Reviewed: I have personally reviewed following labs and imaging studies  CBC: Recent Labs  Lab 02/03/20 1815  WBC 5.9  NEUTROABS 4.2  HGB 9.9*  HCT 33.7*  MCV 88.0  PLT 703   Basic Metabolic Panel: Recent Labs  Lab 02/03/20 1815  NA 136  K 3.5  CL 101  CO2 22  GLUCOSE 120*  BUN 7*  CREATININE 0.90  CALCIUM 8.6*    GFR: Estimated Creatinine Clearance: 32.9 mL/min (by C-G formula based on SCr of 0.9 mg/dL). Liver Function Tests: Recent Labs  Lab 02/03/20 1815  AST 13*  ALT 9  ALKPHOS 76  BILITOT 0.2*  PROT 5.8*  ALBUMIN 3.2*   No results for input(s): LIPASE, AMYLASE in the last 168 hours. No results for input(s): AMMONIA in the last 168 hours. Coagulation Profile: No results for input(s): INR, PROTIME in the last 168 hours. Cardiac Enzymes: No results for input(s): CKTOTAL, CKMB, CKMBINDEX, TROPONINI in the last 168 hours. BNP (last 3 results) No results for input(s): PROBNP in the last 8760 hours. HbA1C: No results for input(s): HGBA1C in the last 72 hours. CBG: No results for input(s): GLUCAP in the last 168 hours. Lipid Profile: No results for input(s): CHOL, HDL, LDLCALC, TRIG, CHOLHDL, LDLDIRECT in the last 72 hours. Thyroid Function Tests: No results for input(s): TSH, T4TOTAL, FREET4, T3FREE, THYROIDAB in the last 72 hours. Anemia Panel: No results for input(s): VITAMINB12, FOLATE, FERRITIN, TIBC, IRON, RETICCTPCT in the last 72 hours. Sepsis Labs: No results for input(s): PROCALCITON, LATICACIDVEN in the last 168 hours.  Recent Results (from the past 240 hour(s))  SARS CORONAVIRUS 2 (TAT 6-24 HRS) Nasopharyngeal Nasopharyngeal Swab     Status: None   Collection Time: 02/03/20 10:50 PM   Specimen: Nasopharyngeal Swab  Result Value Ref Range Status   SARS Coronavirus 2 NEGATIVE NEGATIVE Final    Comment: (NOTE) SARS-CoV-2 target nucleic acids are NOT DETECTED. The SARS-CoV-2 RNA is generally detectable in upper and lower respiratory specimens during the acute phase of infection. Negative results do not preclude SARS-CoV-2 infection, do not rule out co-infections with other pathogens, and should not be used as the sole basis for treatment or other patient management decisions. Negative results must be combined with clinical observations, patient history, and  epidemiological information. The expected result is Negative. Fact Sheet for Patients: SugarRoll.be Fact Sheet for Healthcare Providers: https://www.woods-mathews.com/ This test is not yet approved or cleared by the [place] FDA and  has been authorized for detection and/or diagnosis of SARS-CoV-2 by FDA under an Emergency Use Authorization (EUA). This EUA will remain  in effect (meaning this test can be used) for the duration of the COVID-19 declaration under Section 56 4(b)(1) of the Act, 21 U.S.C. section 360bbb-3(b)(1), unless the authorization is terminated or revoked sooner. Performed at [facility], [facility] [address], [place], [place] [address]          Radiology Studies: DG Ribs Unilateral W/Chest Right  Result Date: 02/03/2020 CLINICAL DATA:  Fall with right-sided chest pain EXAM: RIGHT RIBS AND CHEST - 3+ VIEW COMPARISON:  04/22/2019, CT 10/14/2019 FINDINGS: Single-view chest demonstrates emphysematous disease. Pleural effusions are better seen on chest CT. Irregular consolidation right lung base. Normal heart size. Aortic atherosclerosis. No pneumothorax. Branching calcification at the right hilus, appears to be intravascular on CT and related to pulmonary artery, no change since 10/14/2019. Right rib series demonstrates no acute displaced right rib fracture. IMPRESSION: 1. No definite acute displaced right rib fracture.  No pneumothorax. 2. Irregular bandlike consolidations at the right lung base  corresponding to CT consolidations. Electronically Signed   By: Donavan Foil M.D.   On: 02/03/2020 22:16   CT Head Wo Contrast  Result Date: 02/03/2020 CLINICAL DATA:  Syncope, fell forward EXAM: CT HEAD WITHOUT CONTRAST TECHNIQUE: Contiguous axial images were obtained from the base of the skull through the vertex without intravenous contrast. COMPARISON:  10/31/2019 FINDINGS: Brain: Stable chronic small-vessel ischemic changes  throughout the periventricular white matter. No acute infarct or hemorrhage. Lateral ventricles and midline structures are unremarkable. No acute extra-axial fluid collections. No mass effect. Vascular: No hyperdense vessel or unexpected calcification. Skull: Normal. Negative for fracture or focal lesion. Sinuses/Orbits: Minimal polypoid mucosal thickening right maxillary sinus, stable. Otherwise the sinuses are clear. Other: None IMPRESSION: 1. Chronic small vessel ischemic changes.  No acute process. Electronically Signed   By: Randa Ngo M.D.   On: 02/03/2020 21:46   CT Chest Wo Contrast  Result Date: 02/03/2020 CLINICAL DATA:  Trauma, fall sternal tenderness EXAM: CT CHEST WITHOUT CONTRAST TECHNIQUE: Multidetector CT imaging of the chest was performed following the standard protocol without IV contrast. COMPARISON:  Radiograph 02/03/2020, CT chest 10/14/2019, CT 07/04/2019, PET CT 06/28/2018, 02/18/2019 FINDINGS: Cardiovascular: Limited evaluation without intravenous contrast. Moderate aortic atherosclerosis. No aneurysmal dilatation. Coronary vascular calcification. No pericardial effusion. Mediastinum/Nodes: Midline trachea. No thyroid mass. No suspicious adenopathy. Esophagus is within normal limits. Lungs/Pleura: Emphysema. Small bilateral pleural effusions. Progressive masslike consolidation in the right lower lobe, now measuring 6.2 x 4.2 cm, series 8 image number 109. Architectural distortion in this region. Consolidation has become more confluent in the interim. Areas of bandlike density remain. Mild ground-glass density the periphery. Upper Abdomen: No acute abnormality. Musculoskeletal: No acute fracture or malalignment is seen. The sternum is intact. IMPRESSION: 1. Negative for pneumothorax or rib fracture. 2. Small bilateral pleural effusions 3. Interval progression and increased size of masslike consolidation at the right lower lobe with associated architectural distortion, possibly due to  continued evolution of post radiation change though continued short interval surveillance is recommend to exclude tumor recurrence given the increased size of consolidative masslike region. 4. Emphysema Aortic Atherosclerosis (ICD10-I70.0) and Emphysema (ICD10-J43.9). Electronically Signed   By: Donavan Foil M.D.   On: 02/03/2020 22:11   CT Cervical Spine Wo Contrast  Result Date: 02/03/2020 CLINICAL DATA:  Syncope, fell forward EXAM: CT CERVICAL SPINE WITHOUT CONTRAST TECHNIQUE: Multidetector CT imaging of the cervical spine was performed without intravenous contrast. Multiplanar CT image reconstructions were also generated. COMPARISON:  10/31/2019 FINDINGS: Alignment: Alignment is grossly anatomic and stable. Skull base and vertebrae: No acute displaced fractures. Soft tissues and spinal canal: No prevertebral fluid or swelling. No visible canal hematoma. Disc levels: Stable multilevel cervical spondylosis, with disc space narrowing and osteophyte formation most pronounced at C5-6 and C6-7. Upper chest: Significant emphysema within the upper lobes. Airway is patent. Other: Reconstructed images demonstrate no additional findings. IMPRESSION: 1. Stable cervical spondylosis.  No acute fracture. Electronically Signed   By: Randa Ngo M.D.   On: 02/03/2020 21:48   DG Hip Unilat With Pelvis 2-3 Views Left  Result Date: 02/03/2020 CLINICAL DATA:  Fall EXAM: DG HIP (WITH OR WITHOUT PELVIS) 2-3V LEFT COMPARISON:  CT 10/31/2019 FINDINGS: Pubic symphysis and rami appear intact. No fracture or malalignment. Joint space is maintained. IMPRESSION: No acute osseous abnormality. Electronically Signed   By: Donavan Foil M.D.   On: 02/03/2020 22:17        Scheduled Meds: . fluticasone furoate-vilanterol  1 puff Inhalation Daily  .  folic acid  1 mg Oral Daily  . levothyroxine  25 mcg Oral QAC breakfast  . pantoprazole  40 mg Oral Daily  . senna-docusate  2 tablet Oral QHS   Continuous Infusions:   LOS:  1 day    Time spent:35 mins. More than 50% of that time was spent in counseling and/or coordination of care.      Shelly Coss, MD [facility] P2/27/2021, 8:51 AM

## 2020-02-05 LAB — BASIC METABOLIC PANEL
Anion gap: 9 (ref 5–15)
BUN: <5 mg/dL — ABNORMAL LOW (ref 8–23)
CO2: 24 mmol/L (ref 22–32)
Calcium: 8.9 mg/dL (ref 8.9–10.3)
Chloride: 109 mmol/L (ref 98–111)
Creatinine, Ser: 0.64 mg/dL (ref 0.44–1.00)
GFR calc Af Amer: >60 mL/min (ref >60)
GFR calc non Af Amer: >60 mL/min (ref >60)
Glucose, Bld: 107 mg/dL — ABNORMAL HIGH (ref 70–99)
Potassium: 3.6 mmol/L (ref 3.5–5.1)
Sodium: 142 mmol/L (ref 135–145)

## 2020-02-05 MED ORDER — AMLODIPINE BESYLATE 5 MG PO TABS
5.0000 mg | ORAL_TABLET | Freq: Every day | ORAL | Status: DC
  Administered 2020-02-05 – 2020-02-06 (×2): 5 mg via ORAL
  Filled 2020-02-05 (×2): qty 1

## 2020-02-05 NOTE — Evaluation (Signed)
Occupational Therapy Evaluation Patient Details Name: Heather [name] MRN: [id number] DOB: Feb 28, 1935 Today's Date: 02/05/2020    History of Present Illness 85 yo admitted after syncope at home. PMHx: COPD, hypothyroidism, anxiety, HTN, HLD, depression, Crohns   Clinical Impression   Pt PTA: Pt living alone, reports independence with ADL and son assisting with IADLs 1x weekly. Pt receives MOWs. Pt currently, limited by decreased activity tolerance, decreased strength and decreased ability to care for self.  Pt stood at sink for ~5 mins for grooming tasks and returned to bed with minguardA. Pt starting to cry and feeling very emotional throughout session. Pt a/o x4. Once pt calmed down, pt stating "that really made me feel worn out." Pt modA overall for ADL and minA overall for mobility. Pt would greatly benefit from continued OT skilled services. OT following acutely.    Follow Up Recommendations  SNF    Equipment Recommendations  Other (comment)(to be determined)    Recommendations for Other Services       Precautions / Restrictions Precautions Precautions: Fall Restrictions Weight Bearing Restrictions: No      Mobility Bed Mobility Overal bed mobility: Needs Assistance Bed Mobility: Supine to Sit     Supine to sit: Min assist     General bed mobility comments: min assist to elevate trunk and scoot to EOB with cues for sequence  Transfers Overall transfer level: Needs assistance   Transfers: Sit to/from Stand Sit to Stand: Min assist         General transfer comment: minA for power up    Balance Overall balance assessment: History of Falls                                         ADL either performed or assessed with clinical judgement   ADL Overall ADL's : Needs assistance/impaired Eating/Feeding: Set up;Sitting   Grooming: Min guard;Standing Grooming Details (indicate cue type and reason): stood at sink Upper Body Bathing: Set  up;Sitting;Standing   Lower Body Bathing: Minimal assistance;Cueing for safety;Sitting/lateral leans;Sit to/from stand   Upper Body Dressing : Set up;Sitting   Lower Body Dressing: Minimal assistance;Sitting/lateral leans;Sit to/from stand   Toilet Transfer: Min guard;Ambulation;RW;Grab bars   Toileting- Clothing Manipulation and Hygiene: Minimal assistance;Cueing for safety;Sitting/lateral lean;Sit to/from stand Toileting - Clothing Manipulation Details (indicate cue type and reason): unsteadiness in standing     Functional mobility during ADLs: Min guard;Rolling walker;Cueing for safety General ADL Comments: Pt limited by decreased activity tolerance, decreased strength and decreased ability to care for self.     Vision Baseline Vision/History: Wears glasses Wears Glasses: Reading only Patient Visual Report: No change from baseline Vision Assessment?: No apparent visual deficits     Perception     Praxis      Pertinent Vitals/Pain Pain Assessment: No/denies pain Faces Pain Scale: No hurt Pain Intervention(s): Monitored during session     Hand Dominance Right   Extremity/Trunk Assessment Upper Extremity Assessment Upper Extremity Assessment: Generalized weakness   Lower Extremity Assessment Lower Extremity Assessment: Generalized weakness   Cervical / Trunk Assessment Cervical / Trunk Assessment: Normal   Communication Communication Communication: No difficulties   Cognition Arousal/Alertness: Awake/alert Behavior During Therapy: WFL for tasks assessed/performed Overall Cognitive Status: No family/caregiver present to determine baseline cognitive functioning Area of Impairment: Problem solving  Problem Solving: Slow processing General Comments: Pt starting to cry and feeling very emotional throughout session. Pt a/o x4.    General Comments       Exercises     Shoulder Instructions      Home Living Family/patient  expects to be discharged to:: Private residence Living Arrangements: Alone Available Help at Discharge: Family;Available PRN/intermittently Type of Home: House Home Access: Stairs to enter [employer] of Steps: 2 Entrance Stairs-Rails: Right;Left;Can reach both Home Layout: One level     Bathroom Shower/Tub: Occupational psychologist: Standard     Home Equipment: Grab bars - toilet;Walker - 2 wheels;Walker - 4 wheels;Cane - quad;Shower seat;Hand held shower head          Prior Functioning/Environment Level of Independence: Independent with assistive device(s);Needs assistance  Gait / Transfers Assistance Needed: walks with walker ADL's / Homemaking Assistance Needed: meals on wheels, performs her own ADLs, son manages medication/bills and grocery shopping   Comments: Pt reports recent falls        OT Problem List: Decreased strength;Decreased activity tolerance;Decreased safety awareness;Pain;Impaired balance (sitting and/or standing)      OT Treatment/Interventions: Self-care/ADL training;Therapeutic exercise;Energy conservation;DME and/or AE instruction;Therapeutic activities;Patient/family education;Balance training    OT Goals(Current goals can be found in the care plan section) Acute Rehab OT Goals Patient Stated Goal: be able to have help at home OT Goal Formulation: With patient Time For Goal Achievement: 02/19/20 Potential to Achieve Goals: Good ADL Goals Pt Will Perform Grooming: with supervision;standing Pt Will Perform Lower Body Dressing: with supervision;sitting/lateral leans;sit to/from stand Pt Will Transfer to Toilet: with supervision;ambulating Pt Will Perform Toileting - Clothing Manipulation and hygiene: with supervision;sit to/from stand Pt/caregiver will Perform Home Exercise Program: Increased strength;Both right and left upper extremity;With Supervision  OT Frequency: Min 2X/week   Barriers to D/C: Decreased caregiver  support  1x weekly son comes       Co-evaluation              AM-PAC OT "6 Clicks" Daily Activity     Outcome Measure Help from another person eating meals?: None Help from another person taking care of personal grooming?: A Little Help from another person toileting, which includes using toliet, bedpan, or urinal?: A Lot Help from another person bathing (including washing, rinsing, drying)?: A Lot Help from another person to put on and taking off regular upper body clothing?: A Little Help from another person to put on and taking off regular lower body clothing?: A Lot 6 Click Score: 16   End of Session Equipment Utilized During Treatment: Gait belt;Rolling walker Nurse Communication: Mobility status  Activity Tolerance: Patient limited by pain;Patient limited by fatigue Patient left: in bed;with call bell/phone within reach  OT Visit Diagnosis: Muscle weakness (generalized) (M62.81)                Time: 6767-2094 OT Time Calculation (min): 20 min Charges:  OT General Charges $OT Visit: 1 Visit OT Evaluation $OT Eval Moderate Complexity: 1 Mod  Jefferey Pica, OTR/L Acute Rehabilitation Services Pager: [phone] Office: [phone]   Emelin Dascenzo C 02/05/2020, 4:15 PM

## 2020-02-05 NOTE — Evaluation (Signed)
Physical Therapy Evaluation Patient Details Name: Heather [name] MRN: [id number] DOB: 1935-08-15 Today's Date: 02/05/2020   History of Present Illness  85 yo admitted after syncope at home. PMHx: COPD, hypothyroidism, anxiety, HTN, HLD, depression, Crohns  Clinical Impression  Pt pleasant supine on arrival and reports she cannot move without assistance. Pt requires min assist to perform bed mobility and standing with guarding for gait. Pt lives alone and son checks in 1x/wk pt reports history of falls and inability to care for herself but no additional help at home beyond meals on wheels. Pt with decreased strength, transfers, gait and functional mobility limited by fatigue. Pt would benefit from SNF placement due to all above. Pt will also benefit from acute therapy to maximize strength and function to decrease fall risk.     Follow Up Recommendations SNF;Supervision/Assistance - 24 hour    Equipment Recommendations  None recommended by PT    Recommendations for Other Services OT consult     Precautions / Restrictions Precautions Precautions: Fall Restrictions Weight Bearing Restrictions: No      Mobility  Bed Mobility Overal bed mobility: Needs Assistance Bed Mobility: Supine to Sit     Supine to sit: Min assist     General bed mobility comments: min assist to elevate trunk and scoot to EOB with cues for sequence  Transfers Overall transfer level: Needs assistance   Transfers: Sit to/from Stand Sit to Stand: Min assist         General transfer comment: min assist to rise from surface with cues for hand placement  Ambulation/Gait Ambulation/Gait assistance: Min guard Gait Distance (Feet): 80 Feet Assistive device: Rolling walker (2 wheeled) Gait Pattern/deviations: Step-through pattern;Decreased stride length;Trunk flexed   Gait velocity interpretation: 1.31 - 2.62 ft/sec, indicative of limited community ambulator General Gait Details: pt with gait  limited by fatigue with cues and assist to prevent hitting obstacles x 3  Stairs            Wheelchair Mobility    Modified Rankin (Stroke Patients Only)       Balance Overall balance assessment: History of Falls                                           Pertinent Vitals/Pain Pain Assessment: No/denies pain Faces Pain Scale: No hurt Pain Intervention(s): Monitored during session    Home Living Family/patient expects to be discharged to:: Private residence Living Arrangements: Alone Available Help at Discharge: Family;Available PRN/intermittently Type of Home: House Home Access: Stairs to enter Entrance Stairs-Rails: Right;Left;Can reach both Entrance Stairs-Number of Steps: 2 Home Layout: One level Home Equipment: Grab bars - toilet;Walker - 2 wheels;Walker - 4 wheels;Cane - quad;Shower seat;Hand held shower head      Prior Function Level of Independence: Independent with assistive device(s);Needs assistance   Gait / Transfers Assistance Needed: walks with walker  ADL's / Homemaking Assistance Needed: meals on wheels, performs her own ADLs, son manages medication/bills and grocery shopping  Comments: Pt reports recent falls     Hand Dominance   Dominant Hand: Right    Extremity/Trunk Assessment   Upper Extremity Assessment Upper Extremity Assessment: Generalized weakness    Lower Extremity Assessment Lower Extremity Assessment: Generalized weakness    Cervical / Trunk Assessment Cervical / Trunk Assessment: Kyphotic  Communication   Communication: No difficulties  Cognition Arousal/Alertness: Awake/alert Behavior During Therapy: WFL for tasks  assessed/performed Overall Cognitive Status: No family/caregiver present to determine baseline cognitive functioning Area of Impairment: Problem solving                             Problem Solving: Slow processing General Comments: pt frustrated with fall and lack of  caregivers      General Comments      Exercises     Assessment/Plan    PT Assessment Patient needs continued PT services  PT Problem List Decreased strength;Decreased mobility;Decreased safety awareness;Decreased activity tolerance;Decreased balance;Decreased knowledge of use of DME;Decreased cognition       PT Treatment Interventions Gait training;DME instruction;Therapeutic exercise;Functional mobility training;Therapeutic activities;Patient/family education;Cognitive remediation    PT Goals (Current goals can be found in the Care Plan section)  Acute Rehab PT Goals Patient Stated Goal: be able to have help at home PT Goal Formulation: With patient Time For Goal Achievement: 02/19/20 Potential to Achieve Goals: Fair    Frequency Min 3X/week   Barriers to discharge Decreased caregiver support      Co-evaluation               AM-PAC PT "6 Clicks" Mobility  Outcome Measure Help needed turning from your back to your side while in a flat bed without using bedrails?: A Little Help needed moving from lying on your back to sitting on the side of a flat bed without using bedrails?: A Little Help needed moving to and from a bed to a chair (including a wheelchair)?: A Little Help needed standing up from a chair using your arms (e.g., wheelchair or bedside chair)?: A Little Help needed to walk in hospital room?: A Little Help needed climbing 3-5 steps with a railing? : A Lot 6 Click Score: 17    End of Session Equipment Utilized During Treatment: Gait belt Activity Tolerance: Patient tolerated treatment well Patient left: in bed;with bed alarm set;with call bell/phone within reach Nurse Communication: Mobility status PT Visit Diagnosis: Other abnormalities of gait and mobility (R26.89);Difficulty in walking, not elsewhere classified (R26.2);Muscle weakness (generalized) (M62.81)    Time: 1029-1050 PT Time Calculation (min) (ACUTE ONLY): 21 min   Charges:   PT  Evaluation $PT Eval Moderate Complexity: 1 Mod          Terry Abila P, PT Acute Rehabilitation Services Pager: [phone] Office: [phone]   Sandy Salaam Alaric Gladwin 02/05/2020, 1:59 PM

## 2020-02-05 NOTE — Progress Notes (Signed)
PROGRESS NOTE    CHANYA CHRISLEY  SAY:[id number] DOB: 11/20/1935 DOA: 02/03/2020 PCP: Marrian Salvage, FNP   Brief Narrative:  Patient is 85 year old female with history of COPD, depression, coronary artery disease, hyperlipidemia, hypertension, hypothyroidism, irritable bowel syndrome, Crohn's disease, on multiple sedating medications at home who presented with sudden onset of syncope.  Reported that she passed out up to 5 minutes at home.  She was walking at the time when she fell.  No trauma of the head or any other history injuries.  She was having multiple falls lately.  She was admitted for the management of syncope most likely secondary to polypharmacy.  On presentation she was hemodynamically stable but mildly bradycardic.  She is waiting for PT/OT evaluation.  Assessment & Plan:   Principal Problem:   Syncope and collapse Active Problems:   Hypothyroidism   Anxiety and depression   Essential hypertension   GERD   Hypoxia   COPD with emphysema (HCC)   Syncope: Currently hemodynamically stable but bradycardic.  EKG on presentation did not show heart block .  Echocardiogram showed ejection fraction of 55 to 23%, grade 1 diastolic function.  Blood pressure stable.  She was on Cardizem at home which has been discontinued.  Bradycardia: Could be responsible for syncope.  Cardizem held.  Echocardiogram did not show structural heart defects.  Polypharmacy: On pregabalin, Phenergan, sertraline, tramadol, trazodone, zolpidem at home.  Some of these medications have been held.  Hypothyroidism: Continue Synthyroid  COPD: Currently not in exacerbation.   Not on oxygen at home   hypertension: Currently blood stable.Started on amlodipine  Coronary artery disease: Stable.  Does not complain of any chest pain.  Troponins negative.  Frequent falls /debility/deconditioning: requested PT/OT evaluation.  She lives by herself.  Hypokalemia: Supplemented and corrected.  Chronic  pain syndrome: On pain medications at home and complains of generalized pain.  Continue supportive care         DVT prophylaxis: Lovenox Code Status: DNR Family Communication: called son on phone on 02/04/20 for update Disposition Plan: Patient is from home.  Needs PT/OT evaluation before discharge planning.  She might need to skilled nursing facility.    Consultants: None  Procedures: None  Antimicrobials:  Anti-infectives (From admission, onward)   None      Subjective:  Patient seen and examined at the bedside this morning.  Hemodynamically stable.  Very weak, lying on the bed.  Waiting for PT/OT evaluation.  No active issues otherwise  Objective: Vitals:   02/04/20 1241 02/04/20 1726 02/04/20 2335 02/05/20 0506  BP: (!) 132/57 125/60 (!) 141/55 (!) 156/73  Pulse: (!) 57 (!) 55 (!) 58 71  Resp: 20 18 17 20   Temp: 98 F (36.7 C) 98 F (36.7 C) 98 F (36.7 C) 97.7 F (36.5 C)  TempSrc: Oral Oral Oral Oral  SpO2: 93% 93% 97% 96%  Weight:      Height:        Intake/Output Summary (Last 24 hours) at 02/05/2020 0816 Last data filed at 02/05/2020 0750 Gross per 24 hour  Intake 1403.34 ml  Output --  Net 1403.34 ml   Filed Weights   02/03/20 1814  Weight: 45 kg    Examination:  General exam: Elderly debilitated female HEENT:PERRL,Oral mucosa moist, Ear/Nose normal on gross exam Respiratory system: Diminished air sounds bilaterally, no wheezes or crackles Cardiovascular system: Sinus bradycardia. No JVD, murmurs, rubs, gallops or clicks. No pedal edema. Gastrointestinal system: Abdomen is nondistended, soft and nontender. No  organomegaly or masses felt. Normal bowel sounds heard. Central nervous system: Alert and oriented. No focal neurological deficits. Extremities: No edema, no clubbing ,no cyanosis Skin: No rashes, lesions or ulcers,no icterus ,no pallor   Data Reviewed: I have personally reviewed following labs and imaging studies  CBC: Recent Labs   Lab 02/03/20 1815 02/04/20 1132  WBC 5.9 6.0  NEUTROABS 4.2 4.0  HGB 9.9* 9.6*  HCT 33.7* 32.3*  MCV 88.0 85.9  PLT 358 660   Basic Metabolic Panel: Recent Labs  Lab 02/03/20 1815 02/04/20 1013 02/05/20 0455  NA 136 137 142  K 3.5 3.2* 3.6  CL 101 103 109  CO2 22 25 24   GLUCOSE 120* 128* 107*  BUN 7* <5* <5*  CREATININE 0.90 0.75 0.64  CALCIUM 8.6* 8.5* 8.9   GFR: Estimated Creatinine Clearance: 37 mL/min (by C-G formula based on SCr of 0.64 mg/dL). Liver Function Tests: Recent Labs  Lab 02/03/20 1815 02/04/20 1013  AST 13* 16  ALT 9 5  ALKPHOS 76 69  BILITOT 0.2* 0.4  PROT 5.8* 5.8*  ALBUMIN 3.2* 3.0*   No results for input(s): LIPASE, AMYLASE in the last 168 hours. No results for input(s): AMMONIA in the last 168 hours. Coagulation Profile: No results for input(s): INR, PROTIME in the last 168 hours. Cardiac Enzymes: Recent Labs  Lab 02/04/20 1132  CKTOTAL 31*   BNP (last 3 results) No results for input(s): PROBNP in the last 8760 hours. HbA1C: No results for input(s): HGBA1C in the last 72 hours. CBG: No results for input(s): GLUCAP in the last 168 hours. Lipid Profile: No results for input(s): CHOL, HDL, LDLCALC, TRIG, CHOLHDL, LDLDIRECT in the last 72 hours. Thyroid Function Tests: Recent Labs    02/04/20 1028  TSH 4.847*   Anemia Panel: No results for input(s): VITAMINB12, FOLATE, FERRITIN, TIBC, IRON, RETICCTPCT in the last 72 hours. Sepsis Labs: No results for input(s): PROCALCITON, LATICACIDVEN in the last 168 hours.  Recent Results (from the past 240 hour(s))  SARS CORONAVIRUS 2 (TAT 6-24 HRS) Nasopharyngeal Nasopharyngeal Swab     Status: None   Collection Time: 02/03/20 10:50 PM   Specimen: Nasopharyngeal Swab  Result Value Ref Range Status   SARS Coronavirus 2 NEGATIVE NEGATIVE Final    Comment: (NOTE) SARS-CoV-2 target nucleic acids are NOT DETECTED. The SARS-CoV-2 RNA is generally detectable in upper and lower respiratory  specimens during the acute phase of infection. Negative results do not preclude SARS-CoV-2 infection, do not rule out co-infections with other pathogens, and should not be used as the sole basis for treatment or other patient management decisions. Negative results must be combined with clinical observations, patient history, and epidemiological information. The expected result is Negative. Fact Sheet for Patients: SugarRoll.be Fact Sheet for Healthcare Providers: https://www.woods-mathews.com/ This test is not yet approved or cleared by the [place] FDA and  has been authorized for detection and/or diagnosis of SARS-CoV-2 by FDA under an Emergency Use Authorization (EUA). This EUA will remain  in effect (meaning this test can be used) for the duration of the COVID-19 declaration under Section 56 4(b)(1) of the Act, 21 U.S.C. section 360bbb-3(b)(1), unless the authorization is terminated or revoked sooner. Performed at [facility], [place] [address], [place], [place] [address]          Radiology Studies: DG Ribs Unilateral W/Chest Right  Result Date: 02/03/2020 CLINICAL DATA:  Fall with right-sided chest pain EXAM: RIGHT RIBS AND CHEST - 3+ VIEW COMPARISON:  04/22/2019,  CT 10/14/2019 FINDINGS: Single-view chest demonstrates emphysematous disease. Pleural effusions are better seen on chest CT. Irregular consolidation right lung base. Normal heart size. Aortic atherosclerosis. No pneumothorax. Branching calcification at the right hilus, appears to be intravascular on CT and related to pulmonary artery, no change since 10/14/2019. Right rib series demonstrates no acute displaced right rib fracture. IMPRESSION: 1. No definite acute displaced right rib fracture.  No pneumothorax. 2. Irregular bandlike consolidations at the right lung base corresponding to CT consolidations. Electronically Signed   By: Donavan Foil M.D.   On: 02/03/2020 22:16    CT Head Wo Contrast  Result Date: 02/03/2020 CLINICAL DATA:  Syncope, fell forward EXAM: CT HEAD WITHOUT CONTRAST TECHNIQUE: Contiguous axial images were obtained from the base of the skull through the vertex without intravenous contrast. COMPARISON:  10/31/2019 FINDINGS: Brain: Stable chronic small-vessel ischemic changes throughout the periventricular white matter. No acute infarct or hemorrhage. Lateral ventricles and midline structures are unremarkable. No acute extra-axial fluid collections. No mass effect. Vascular: No hyperdense vessel or unexpected calcification. Skull: Normal. Negative for fracture or focal lesion. Sinuses/Orbits: Minimal polypoid mucosal thickening right maxillary sinus, stable. Otherwise the sinuses are clear. Other: None IMPRESSION: 1. Chronic small vessel ischemic changes.  No acute process. Electronically Signed   By: Randa Ngo M.D.   On: 02/03/2020 21:46   CT Chest Wo Contrast  Result Date: 02/03/2020 CLINICAL DATA:  Trauma, fall sternal tenderness EXAM: CT CHEST WITHOUT CONTRAST TECHNIQUE: Multidetector CT imaging of the chest was performed following the standard protocol without IV contrast. COMPARISON:  Radiograph 02/03/2020, CT chest 10/14/2019, CT 07/04/2019, PET CT 06/28/2018, 02/18/2019 FINDINGS: Cardiovascular: Limited evaluation without intravenous contrast. Moderate aortic atherosclerosis. No aneurysmal dilatation. Coronary vascular calcification. No pericardial effusion. Mediastinum/Nodes: Midline trachea. No thyroid mass. No suspicious adenopathy. Esophagus is within normal limits. Lungs/Pleura: Emphysema. Small bilateral pleural effusions. Progressive masslike consolidation in the right lower lobe, now measuring 6.2 x 4.2 cm, series 8 image number 109. Architectural distortion in this region. Consolidation has become more confluent in the interim. Areas of bandlike density remain. Mild ground-glass density the periphery. Upper Abdomen: No acute abnormality.  Musculoskeletal: No acute fracture or malalignment is seen. The sternum is intact. IMPRESSION: 1. Negative for pneumothorax or rib fracture. 2. Small bilateral pleural effusions 3. Interval progression and increased size of masslike consolidation at the right lower lobe with associated architectural distortion, possibly due to continued evolution of post radiation change though continued short interval surveillance is recommend to exclude tumor recurrence given the increased size of consolidative masslike region. 4. Emphysema Aortic Atherosclerosis (ICD10-I70.0) and Emphysema (ICD10-J43.9). Electronically Signed   By: Donavan Foil M.D.   On: 02/03/2020 22:11   CT Cervical Spine Wo Contrast  Result Date: 02/03/2020 CLINICAL DATA:  Syncope, fell forward EXAM: CT CERVICAL SPINE WITHOUT CONTRAST TECHNIQUE: Multidetector CT imaging of the cervical spine was performed without intravenous contrast. Multiplanar CT image reconstructions were also generated. COMPARISON:  10/31/2019 FINDINGS: Alignment: Alignment is grossly anatomic and stable. Skull base and vertebrae: No acute displaced fractures. Soft tissues and spinal canal: No prevertebral fluid or swelling. No visible canal hematoma. Disc levels: Stable multilevel cervical spondylosis, with disc space narrowing and osteophyte formation most pronounced at C5-6 and C6-7. Upper chest: Significant emphysema within the upper lobes. Airway is patent. Other: Reconstructed images demonstrate no additional findings. IMPRESSION: 1. Stable cervical spondylosis.  No acute fracture. Electronically Signed   By: Randa Ngo M.D.   On: 02/03/2020 21:48   ECHOCARDIOGRAM COMPLETE  Result Date: 02/04/2020    ECHOCARDIOGRAM REPORT   Patient Name:   JONIA OAKEY [facility] Date of Exam: 02/04/2020 Medical Rec #:  [id number]           Height:       59.0 in Accession #:    [id number]          Weight:       99.2 lb Date of Birth:  1935/07/02           BSA:          1.369 m Patient Age:     33 years            BP:           98/65 mmHg Patient Gender: F                   HR:           61 bpm. Exam Location:  Inpatient Procedure: 2D Echo, Cardiac Doppler and Color Doppler Indications:    Syncope 780.2  History:        Patient has prior history of Echocardiogram examinations, most                 recent 08/14/2014. COPD; Risk Factors:Hypertension, Dyslipidemia                 and Former Smoker. GERD.  Sonographer:    Vickie Epley RDCS Referring Phys: [id number] Aiana Nordquist IMPRESSIONS  1. Left ventricular ejection fraction, by estimation, is 55 to 60%. The left ventricle has normal function. The left ventricle has no regional wall motion abnormalities. Left ventricular diastolic parameters are consistent with Grade I diastolic dysfunction (impaired relaxation).  2. Right ventricular systolic function is normal. The right ventricular size is normal. There is mildly elevated pulmonary artery systolic pressure.  3. The mitral valve is grossly normal. Mild mitral valve regurgitation.  4. Tricuspid valve regurgitation is mild to moderate.  5. The aortic valve is grossly normal. Aortic valve regurgitation is trivial.  6. The inferior vena cava is dilated in size with <50% respiratory variability, suggesting right atrial pressure of 15 mmHg. FINDINGS  Left Ventricle: Left ventricular ejection fraction, by estimation, is 55 to 60%. The left ventricle has normal function. The left ventricle has no regional wall motion abnormalities. The left ventricular internal cavity size was normal in size. There is  no left ventricular hypertrophy. Left ventricular diastolic parameters are consistent with Grade I diastolic dysfunction (impaired relaxation). Right Ventricle: The right ventricular size is normal. No increase in right ventricular wall thickness. Right ventricular systolic function is normal. There is mildly elevated pulmonary artery systolic pressure. The tricuspid regurgitant velocity is 2.40  m/s, and with an  assumed right atrial pressure of 15 mmHg, the estimated right ventricular systolic pressure is 53.9 mmHg. Left Atrium: Left atrial size was normal in size. Right Atrium: Right atrial size was normal in size. Pericardium: There is no evidence of pericardial effusion. Mitral Valve: The mitral valve is grossly normal. Mild mitral annular calcification. Mild mitral valve regurgitation. Tricuspid Valve: The tricuspid valve is normal in structure. Tricuspid valve regurgitation is mild to moderate. Aortic Valve: The aortic valve is grossly normal. Aortic valve regurgitation is trivial. Pulmonic Valve: The pulmonic valve was normal in structure. Pulmonic valve regurgitation is not visualized. Aorta: The aortic root is normal in size and structure. Venous: The inferior vena cava is dilated in size with less than 50% respiratory variability, suggesting right atrial  pressure of 15 mmHg. IAS/Shunts: No atrial level shunt detected by color flow Doppler.  LEFT VENTRICLE PLAX 2D LVIDd:         3.70 cm     Diastology LVIDs:         2.80 cm     LV e' lateral:   6.73 cm/s LV PW:         0.80 cm     LV E/e' lateral: 13.0 LV IVS:        0.80 cm     LV e' medial:    5.35 cm/s LVOT diam:     1.50 cm     LV E/e' medial:  16.4 LV SV:         43 LV SV Index:   32 LVOT Area:     1.77 cm  LV Volumes (MOD) LV vol d, MOD A2C: 66.4 ml LV vol d, MOD A4C: 64.8 ml LV vol s, MOD A2C: 29.6 ml LV vol s, MOD A4C: 28.5 ml LV SV MOD A2C:     36.8 ml LV SV MOD A4C:     64.8 ml LV SV MOD BP:      36.8 ml RIGHT VENTRICLE RV S prime:     13.20 cm/s TAPSE (M-mode): 1.8 cm LEFT ATRIUM             Index       RIGHT ATRIUM          Index LA diam:        2.50 cm 1.83 cm/m  RA Area:     7.77 cm LA Vol (A2C):   26.9 ml 19.65 ml/m RA Volume:   14.00 ml 10.23 ml/m LA Vol (A4C):   31.2 ml 22.79 ml/m LA Biplane Vol: 29.2 ml 21.33 ml/m  AORTIC VALVE LVOT Vmax:   105.00 cm/s LVOT Vmean:  66.300 cm/s LVOT VTI:    0.246 m  AORTA Ao Root diam: 3.20 cm MITRAL VALVE                TRICUSPID VALVE MV Area (PHT): 3.53 cm    TV Peak grad:   31.1 mmHg MV Decel Time: 215 msec    TV Vmax:        2.79 m/s MV E velocity: 87.50 cm/s  TR Peak grad:   23.0 mmHg MV A velocity: 90.30 cm/s  TR Vmax:        240.00 cm/s MV E/A ratio:  0.97                            SHUNTS                            Systemic VTI:  0.25 m                            Systemic Diam: 1.50 cm Dorris Carnes MD Electronically signed by Dorris Carnes MD Signature Date/Time: 02/04/2020/1:40:13 PM    Final    DG Hip Unilat With Pelvis 2-3 Views Left  Result Date: 02/03/2020 CLINICAL DATA:  Fall EXAM: DG HIP (WITH OR WITHOUT PELVIS) 2-3V LEFT COMPARISON:  CT 10/31/2019 FINDINGS: Pubic symphysis and rami appear intact. No fracture or malalignment. Joint space is maintained. IMPRESSION: No acute osseous abnormality. Electronically Signed   By: Donavan Foil M.D.   On: 02/03/2020 22:17  Scheduled Meds: . enoxaparin (LOVENOX) injection  40 mg Subcutaneous Q24H  . fluticasone furoate-vilanterol  1 puff Inhalation Daily  . folic acid  1 mg Oral Daily  . levothyroxine  25 mcg Oral Q0600  . pantoprazole  40 mg Oral Daily  . senna-docusate  2 tablet Oral QHS   Continuous Infusions: . sodium chloride Stopped (02/05/20 0750)     LOS: 2 days    Time spent:35 mins. More than 50% of that time was spent in counseling and/or coordination of care.      Shelly Coss, MD [facility] P2/28/2021, 8:16 AM

## 2020-02-06 MED ORDER — NON FORMULARY: 6.0000 mg | Freq: Every evening | Status: DC | PRN

## 2020-02-06 MED ORDER — MELATONIN 3 MG PO TABS
6.0000 mg | ORAL_TABLET | Freq: Every evening | ORAL | Status: AC | PRN
  Administered 2020-02-06 – 2020-02-07 (×2): 6 mg via ORAL
  Filled 2020-02-06 (×4): qty 2

## 2020-02-06 MED ORDER — AMLODIPINE BESYLATE 10 MG PO TABS
10.0000 mg | ORAL_TABLET | Freq: Every day | ORAL | Status: DC
  Administered 2020-02-07 – 2020-02-08 (×2): 10 mg via ORAL
  Filled 2020-02-06 (×2): qty 1

## 2020-02-06 NOTE — Progress Notes (Signed)
PROGRESS NOTE    Heather [name]  BZJ:[id number] DOB: 04-20-1935 DOA: 02/03/2020 PCP: Marrian Salvage, FNP   Brief Narrative:  Patient is 85 year old female with history of COPD, depression, coronary artery disease, hyperlipidemia, hypertension, hypothyroidism, irritable bowel syndrome, Crohn's disease, on multiple sedating medications at home who presented with sudden onset of syncope.  Reported that she passed out up to 5 minutes at home.  She was walking at the time when she fell.  No trauma of the head or any other history injuries.  She was having multiple falls lately.  She was admitted for the management of syncope most likely secondary to polypharmacy.  On presentation she was hemodynamically stable but mildly bradycardic.  PT/OT recommended skilled nursing facility.  She is hemodynamically stable for discharge to skilled nursing facility as soon as bed is available.  Assessment & Plan:   Principal Problem:   Syncope and collapse Active Problems:   Hypothyroidism   Anxiety and depression   Essential hypertension   GERD   Hypoxia   COPD with emphysema (HCC)   Syncope: Currently hemodynamically stable but bradycardic.  EKG on presentation did not show heart block .  Echocardiogram showed ejection fraction of 55 to 01%, grade 1 diastolic function.  Blood pressure stable.  She was on Cardizem at home which has been discontinued.  Bradycardia: Could be responsible for syncope.  Cardizem held.  Echocardiogram did not show structural heart defects.  Rate much better now.  Polypharmacy: On pregabalin, Phenergan, sertraline, tramadol, trazodone, zolpidem at home.  Some of these medications have been held.  Hypothyroidism: Continue Synthyroid  COPD: Currently not in exacerbation.   Not on oxygen at home   hypertension: Currently blood stable.Started on amlodipine  Coronary artery disease: Stable.  Does not complain of any chest pain.  Troponins negative.  Frequent falls  /debility/deconditioning: Lives by herself.  PT/OT recommended skilled nursing facility.  Hypokalemia: Supplemented and corrected.  Chronic pain syndrome: On pain medications at home and complains of generalized pain.  Continue supportive care         DVT prophylaxis: Lovenox Code Status: DNR Family Communication: called son on phone on 02/06/20 for update Disposition Plan: Patient is from home.  PT/OT recommending skilled nursing facility.  She is hemodynamically stable for discharge as soon as bed is available.   Consultants: None  Procedures: None  Antimicrobials:  Anti-infectives (From admission, onward)   None      Subjective:  Patient seen and examined at the bedside this morning.  Hemodynamically stable.  Has generalized weakness.  No acute problems right now.  Denies any complaints.  I talked to the son on phone today.  Objective: Vitals:   02/05/20 1802 02/05/20 2330 02/06/20 0540 02/06/20 0742  BP: (!) 124/55 (!) 142/96 (!) 143/67   Pulse: 81 77 75   Resp: 18 18 18    Temp: 98.7 F (37.1 C) 98.1 F (36.7 C) 98 F (36.7 C)   TempSrc: Oral Oral Oral   SpO2: 94% 96% 93% 98%  Weight:      Height:        Intake/Output Summary (Last 24 hours) at 02/06/2020 7510 Last data filed at 02/05/2020 2018 Gross per 24 hour  Intake 720 ml  Output --  Net 720 ml   Filed Weights   02/03/20 1814  Weight: 45 kg    Examination:   General exam: Elderly pleasant female, generalized weakness Respiratory system: no wheezes or crackles  Cardiovascular system: S1 & S2 heard,  RRR. No JVD, murmurs, rubs, gallops or clicks. Gastrointestinal system: Abdomen is nondistended, soft and nontender. No organomegaly or masses felt. Normal bowel sounds heard. Central nervous system: Alert and oriented. No focal neurological deficits. Extremities: No edema, no clubbing ,no cyanosis Skin: No rashes, lesions or ulcers,no icterus ,no pallor   Data Reviewed: I have personally reviewed  following labs and imaging studies  CBC: Recent Labs  Lab 02/03/20 1815 02/04/20 1132  WBC 5.9 6.0  NEUTROABS 4.2 4.0  HGB 9.9* 9.6*  HCT 33.7* 32.3*  MCV 88.0 85.9  PLT 358 295   Basic Metabolic Panel: Recent Labs  Lab 02/03/20 1815 02/04/20 1013 02/05/20 0455  NA 136 137 142  K 3.5 3.2* 3.6  CL 101 103 109  CO2 22 25 24   GLUCOSE 120* 128* 107*  BUN 7* <5* <5*  CREATININE 0.90 0.75 0.64  CALCIUM 8.6* 8.5* 8.9   GFR: Estimated Creatinine Clearance: 37 mL/min (by C-G formula based on SCr of 0.64 mg/dL). Liver Function Tests: Recent Labs  Lab 02/03/20 1815 02/04/20 1013  AST 13* 16  ALT 9 5  ALKPHOS 76 69  BILITOT 0.2* 0.4  PROT 5.8* 5.8*  ALBUMIN 3.2* 3.0*   No results for input(s): LIPASE, AMYLASE in the last 168 hours. No results for input(s): AMMONIA in the last 168 hours. Coagulation Profile: No results for input(s): INR, PROTIME in the last 168 hours. Cardiac Enzymes: Recent Labs  Lab 02/04/20 1132  CKTOTAL 31*   BNP (last 3 results) No results for input(s): PROBNP in the last 8760 hours. HbA1C: No results for input(s): HGBA1C in the last 72 hours. CBG: No results for input(s): GLUCAP in the last 168 hours. Lipid Profile: No results for input(s): CHOL, HDL, LDLCALC, TRIG, CHOLHDL, LDLDIRECT in the last 72 hours. Thyroid Function Tests: Recent Labs    02/04/20 1028  TSH 4.847*   Anemia Panel: No results for input(s): VITAMINB12, FOLATE, FERRITIN, TIBC, IRON, RETICCTPCT in the last 72 hours. Sepsis Labs: No results for input(s): PROCALCITON, LATICACIDVEN in the last 168 hours.  Recent Results (from the past 240 hour(s))  SARS CORONAVIRUS 2 (TAT 6-24 HRS) Nasopharyngeal Nasopharyngeal Swab     Status: None   Collection Time: 02/03/20 10:50 PM   Specimen: Nasopharyngeal Swab  Result Value Ref Range Status   SARS Coronavirus 2 NEGATIVE NEGATIVE Final    Comment: (NOTE) SARS-CoV-2 target nucleic acids are NOT DETECTED. The SARS-CoV-2 RNA  is generally detectable in upper and lower respiratory specimens during the acute phase of infection. Negative results do not preclude SARS-CoV-2 infection, do not rule out co-infections with other pathogens, and should not be used as the sole basis for treatment or other patient management decisions. Negative results must be combined with clinical observations, patient history, and epidemiological information. The expected result is Negative. Fact Sheet for Patients: SugarRoll.be Fact Sheet for Healthcare Providers: https://www.woods-mathews.com/ This test is not yet approved or cleared by the [place] FDA and  has been authorized for detection and/or diagnosis of SARS-CoV-2 by FDA under an Emergency Use Authorization (EUA). This EUA will remain  in effect (meaning this test can be used) for the duration of the COVID-19 declaration under Section 56 4(b)(1) of the Act, 21 U.S.C. section 360bbb-3(b)(1), unless the authorization is terminated or revoked sooner. Performed at [facility], [place] [address], [place], [place] [address]          Radiology Studies: ECHOCARDIOGRAM COMPLETE  Result Date: 02/04/2020    ECHOCARDIOGRAM REPORT  Patient Name:   MARVELL STAVOLA [facility] Date of Exam: 02/04/2020 Medical Rec #:  [id number]           Height:       59.0 in Accession #:    [id number]          Weight:       99.2 lb Date of Birth:  [birth year]           BSA:          1.369 m Patient Age:    60 years            BP:           98/65 mmHg Patient Gender: F                   HR:           61 bpm. Exam Location:  Inpatient Procedure: 2D Echo, Cardiac Doppler and Color Doppler Indications:    Syncope 780.2  History:        Patient has prior history of Echocardiogram examinations, most                 recent 08/14/2014. COPD; Risk Factors:Hypertension, Dyslipidemia                 and Former Smoker. GERD.  Sonographer:    Vickie Epley RDCS Referring Phys:  [id number] Thurman Sarver IMPRESSIONS  1. Left ventricular ejection fraction, by estimation, is 55 to 60%. The left ventricle has normal function. The left ventricle has no regional wall motion abnormalities. Left ventricular diastolic parameters are consistent with Grade I diastolic dysfunction (impaired relaxation).  2. Right ventricular systolic function is normal. The right ventricular size is normal. There is mildly elevated pulmonary artery systolic pressure.  3. The mitral valve is grossly normal. Mild mitral valve regurgitation.  4. Tricuspid valve regurgitation is mild to moderate.  5. The aortic valve is grossly normal. Aortic valve regurgitation is trivial.  6. The inferior vena cava is dilated in size with <50% respiratory variability, suggesting right atrial pressure of 15 mmHg. FINDINGS  Left Ventricle: Left ventricular ejection fraction, by estimation, is 55 to 60%. The left ventricle has normal function. The left ventricle has no regional wall motion abnormalities. The left ventricular internal cavity size was normal in size. There is  no left ventricular hypertrophy. Left ventricular diastolic parameters are consistent with Grade I diastolic dysfunction (impaired relaxation). Right Ventricle: The right ventricular size is normal. No increase in right ventricular wall thickness. Right ventricular systolic function is normal. There is mildly elevated pulmonary artery systolic pressure. The tricuspid regurgitant velocity is 2.40  m/s, and with an assumed right atrial pressure of 15 mmHg, the estimated right ventricular systolic pressure is 66.0 mmHg. Left Atrium: Left atrial size was normal in size. Right Atrium: Right atrial size was normal in size. Pericardium: There is no evidence of pericardial effusion. Mitral Valve: The mitral valve is grossly normal. Mild mitral annular calcification. Mild mitral valve regurgitation. Tricuspid Valve: The tricuspid valve is normal in structure. Tricuspid valve  regurgitation is mild to moderate. Aortic Valve: The aortic valve is grossly normal. Aortic valve regurgitation is trivial. Pulmonic Valve: The pulmonic valve was normal in structure. Pulmonic valve regurgitation is not visualized. Aorta: The aortic root is normal in size and structure. Venous: The inferior vena cava is dilated in size with less than 50% respiratory variability, suggesting right atrial pressure of 15 mmHg. IAS/Shunts: No atrial level shunt detected  by color flow Doppler.  LEFT VENTRICLE PLAX 2D LVIDd:         3.70 cm     Diastology LVIDs:         2.80 cm     LV e' lateral:   6.73 cm/s LV PW:         0.80 cm     LV E/e' lateral: 13.0 LV IVS:        0.80 cm     LV e' medial:    5.35 cm/s LVOT diam:     1.50 cm     LV E/e' medial:  16.4 LV SV:         43 LV SV Index:   32 LVOT Area:     1.77 cm  LV Volumes (MOD) LV vol d, MOD A2C: 66.4 ml LV vol d, MOD A4C: 64.8 ml LV vol s, MOD A2C: 29.6 ml LV vol s, MOD A4C: 28.5 ml LV SV MOD A2C:     36.8 ml LV SV MOD A4C:     64.8 ml LV SV MOD BP:      36.8 ml RIGHT VENTRICLE RV S prime:     13.20 cm/s TAPSE (M-mode): 1.8 cm LEFT ATRIUM             Index       RIGHT ATRIUM          Index LA diam:        2.50 cm 1.83 cm/m  RA Area:     7.77 cm LA Vol (A2C):   26.9 ml 19.65 ml/m RA Volume:   14.00 ml 10.23 ml/m LA Vol (A4C):   31.2 ml 22.79 ml/m LA Biplane Vol: 29.2 ml 21.33 ml/m  AORTIC VALVE LVOT Vmax:   105.00 cm/s LVOT Vmean:  66.300 cm/s LVOT VTI:    0.246 m  AORTA Ao Root diam: 3.20 cm MITRAL VALVE               TRICUSPID VALVE MV Area (PHT): 3.53 cm    TV Peak grad:   31.1 mmHg MV Decel Time: 215 msec    TV Vmax:        2.79 m/s MV E velocity: 87.50 cm/s  TR Peak grad:   23.0 mmHg MV A velocity: 90.30 cm/s  TR Vmax:        240.00 cm/s MV E/A ratio:  0.97                            SHUNTS                            Systemic VTI:  0.25 m                            Systemic Diam: 1.50 cm Dorris Carnes MD Electronically signed by Dorris Carnes MD Signature  Date/Time: 02/04/2020/1:40:13 PM    Final         Scheduled Meds: . amLODipine  5 mg Oral Daily  . enoxaparin (LOVENOX) injection  40 mg Subcutaneous Q24H  . fluticasone furoate-vilanterol  1 puff Inhalation Daily  . folic acid  1 mg Oral Daily  . levothyroxine  25 mcg Oral Q0600  . pantoprazole  40 mg Oral Daily  . senna-docusate  2 tablet Oral QHS   Continuous Infusions:    LOS: 3  days    Time spent:35 mins. More than 50% of that time was spent in counseling and/or coordination of care.      Shelly Coss, MD [facility] P3/12/2019, 8:24 AM

## 2020-02-06 NOTE — TOC Initial Note (Signed)
Transition of Care [facility]) - Initial/Assessment Note    Patient Details  Name: Heather [name] MRN: [id number] Date of Birth: 06-12-1935  [facility] CM/SW Contact:    Marilu Favre, RN Phone Number: 02/06/2020, 1:29 PM  Clinical Narrative:                 Patient from home alone. Spoke to patient and son [name] at bedside. Discussed PT recommendations for SNF. Both in agreement. [name] lives in [place] , he would prefer a SNF in [place] , so it would be closer for him to go visit. Explained SNF are not allowing any visitation as of yet. Both voiced understanding and are open to SNF in [place] or [place] areas.   Provided SNF Medicare.gov lists for both areas and will provide bed offers to patient and [name] once received.  Expected Discharge Plan: Skilled Nursing Facility Barriers to Discharge: Continued Medical Work up   Patient Goals and CMS Choice Patient states their goals for this hospitalization and ongoing recovery are:: to get stronger CMS Medicare.gov Compare Post Acute Care list provided to:: Patient Choice offered to / list presented to : Patient, Adult Children(Steve Burt Ek)  Expected Discharge Plan and Services Expected Discharge Plan: [place]   Discharge Planning Services: CM Consult Post Acute Care Choice: Nursing Home Living arrangements for the past 2 months: Single Family Home                 DME Arranged: N/A         HH Arranged: NA          Prior Living Arrangements/Services Living arrangements for the past 2 months: Single Family Home Lives with:: Self Patient language and need for interpreter reviewed:: Yes Do you feel safe going back to the place where you live?: Yes      Need for Family Participation in Patient Care: Yes (Comment) Care giver support system in place?: Yes (comment)   Criminal Activity/Legal Involvement Pertinent to Current Situation/Hospitalization: No - Comment as  needed  Activities of Daily Living      Permission Sought/Granted   Permission granted to share information with : Yes, Verbal Permission Granted  Share Information with NAME: [name] [phone]           Emotional Assessment Appearance:: Appears stated age Attitude/Demeanor/Rapport: Engaged Affect (typically observed): Accepting Orientation: : Oriented to Self, Oriented to Place, Oriented to  Time, Oriented to Situation Alcohol / Substance Use: Not Applicable Psych Involvement: No (comment)  Admission diagnosis:  Syncope and collapse [R55] Fall, initial encounter [W19.XXXA] Right-sided chest pain [R07.9] Patient Active Problem List   Diagnosis Date Noted  . Syncope and collapse 02/03/2020  . Fall 04/22/2019  . Elevated CK 04/22/2019  . Leukocytosis 04/22/2019  . Malignant neoplasm of lower lobe of right lung ([place]) 11/16/2018  . Dehydration   . Abdominal pain 02/27/2018  . Colitis 02/27/2018  . SBO (small bowel obstruction) ([place]) 02/27/2018  . Hypokalemia 02/19/2018  . Oral thrush 01/29/2018  . Malnutrition of moderate degree 01/29/2018  . COPD with acute exacerbation ([place]) 01/28/2018  . COPD with emphysema ([place]) 06/26/2017  . COPD exacerbation ([place]) 05/29/2016  . Hypoxia 05/29/2016  . Coronary atherosclerosis 07/24/2015  . Atherosclerosis of aorta ([place]) 07/24/2015  . Dysphagia 07/24/2015  . Acute respiratory failure with hypoxia ([place]) 07/23/2015  . Bronchospasm with bronchitis, acute 07/23/2015  . HIP PAIN, LEFT 05/16/2010  . LOW BACK PAIN, CHRONIC 10/01/2009  .  PARESTHESIA 10/01/2009  . Hypothyroidism 07/23/2009  . Dyslipidemia 07/23/2009  . Anxiety 07/23/2009  . Anxiety and depression 07/23/2009  . Essential hypertension 07/23/2009  . GERD 07/23/2009  . DIVERTICULOSIS, COLON 07/23/2009  . HEADACHE 07/23/2009   PCP:  Marrian Salvage, [place] Pharmacy:   [facility] [address], [place] - [address] [place]  [address] Phone: [phone] Fax: [phone]  [place], [place] [address] Phone: [phone] Fax: [phone]     Social Determinants of Health (SDOH) Interventions    Readmission Risk Interventions No flowsheet data found.

## 2020-02-06 NOTE — Care Management Important Message (Signed)
Important Message  Patient Details  Name: Heather [name] MRN: [id number] Date of Birth: 07/05/1935   Medicare Important Message Given:  Yes     Jahmier Willadsen [address] 02/06/2020, 12:55 PM

## 2020-02-06 NOTE — NC FL2 (Signed)
[place] LEVEL OF CARE SCREENING TOOL     IDENTIFICATION  Patient Name: Heather [name] Birthdate: Apr 19, 1935 Sex: female Admission Date (Current Location): 02/03/2020  [facility] and [place] Number:  Herbalist and Address:  [facility] [place], [place] [address] [place], [place] [address]      Provider Number: [id number]  Attending Physician Name and Address:  Shelly Coss, MD  Relative Name and Phone Number:       Current Level of Care: Hospital Recommended Level of Care: [place] Prior Approval Number:    Date Approved/Denied:   PASRR Number: [id number]  Discharge Plan: SNF    Current Diagnoses: Patient Active Problem List   Diagnosis Date Noted  . Syncope and collapse 02/03/2020  . Fall 04/22/2019  . Elevated CK 04/22/2019  . Leukocytosis 04/22/2019  . Malignant neoplasm of lower lobe of right lung ([place]) 11/16/2018  . Dehydration   . Abdominal pain 02/27/2018  . Colitis 02/27/2018  . SBO (small bowel obstruction) ([place]) 02/27/2018  . Hypokalemia 02/19/2018  . Oral thrush 01/29/2018  . Malnutrition of moderate degree 01/29/2018  . COPD with acute exacerbation ([place]) 01/28/2018  . COPD with emphysema ([place]) 06/26/2017  . COPD exacerbation ([place]) 05/29/2016  . Hypoxia 05/29/2016  . Coronary atherosclerosis 07/24/2015  . Atherosclerosis of aorta ([place]) 07/24/2015  . Dysphagia 07/24/2015  . Acute respiratory failure with hypoxia ([place]) 07/23/2015  . Bronchospasm with bronchitis, acute 07/23/2015  . HIP PAIN, LEFT 05/16/2010  . LOW BACK PAIN, CHRONIC 10/01/2009  . PARESTHESIA 10/01/2009  . Hypothyroidism 07/23/2009  . Dyslipidemia 07/23/2009  . Anxiety 07/23/2009  . Anxiety and depression 07/23/2009  . Essential hypertension 07/23/2009  . GERD 07/23/2009  . DIVERTICULOSIS, COLON 07/23/2009  . HEADACHE 07/23/2009    Orientation RESPIRATION BLADDER Height & Weight     Self, Time, Situation, Place  Normal Continent Weight: 99 lb 3.3 oz (45 kg) Height:  4\' 11"  (149.9 cm)  BEHAVIORAL SYMPTOMS/MOOD NEUROLOGICAL BOWEL NUTRITION STATUS      Continent Diet(see discharge summary)  AMBULATORY STATUS COMMUNICATION OF NEEDS Skin   Extensive Assist Verbally Bruising, Other (Comment)(generalized ecchymosis)                       Personal Care Assistance Level of Assistance  Dressing, Feeding, Bathing Bathing Assistance: Maximum assistance Feeding assistance: Independent Dressing Assistance: Maximum assistance     Functional Limitations Info  Sight, Hearing, Speech Sight Info: Adequate Hearing Info: Adequate Speech Info: Adequate    SPECIAL CARE FACTORS FREQUENCY  OT (By licensed OT), PT (By licensed PT)     PT Frequency: 5x week OT Frequency: 5x week            Contractures Contractures Info: Not present    Additional Factors Info  Code Status, Allergies Code Status Info: DNR Allergies Info: Tylenol (Acetaminophen), Symbicort (Budesonide-formoterol Fumarate)           Current Medications (02/06/2020):  This is the current hospital active medication list Current Facility-Administered Medications  Medication Dose Route Frequency Provider Last Rate Last Admin  . [START ON 02/07/2020] amLODipine (NORVASC) tablet 10 mg  10 mg Oral Daily Adhikari, Amrit, MD      . enoxaparin (LOVENOX) injection 40 mg  40 mg Subcutaneous Q24H Jonelle Sidle, Mohammad L, MD   40 mg at 02/06/20 1046  . fluticasone furoate-vilanterol (BREO ELLIPTA) 200-25 MCG/INH 1 puff  1 puff Inhalation Daily Shelly Coss, MD   1  puff at 02/06/20 0741  . folic acid (FOLVITE) tablet 1 mg  1 mg Oral Daily Adhikari, Amrit, MD   1 mg at 02/06/20 1045  . haloperidol lactate (HALDOL) injection 2.5 mg  2.5 mg Intravenous Q6H PRN Arby Barrette A, NP   2.5 mg at 02/04/20 2335  . levothyroxine (SYNTHROID) tablet 25 mcg  25 mcg Oral Q0600 Shelly Coss, MD   25 mcg at 02/06/20 0533  . ondansetron (ZOFRAN) tablet 4 mg   4 mg Oral Q6H PRN Gala Romney L, MD   4 mg at 02/06/20 1131   Or  . ondansetron (ZOFRAN) injection 4 mg  4 mg Intravenous Q6H PRN Gala Romney L, MD      . pantoprazole (PROTONIX) EC tablet 40 mg  40 mg Oral Daily Shelly Coss, MD   40 mg at 02/06/20 1045  . senna-docusate (Senokot-S) tablet 2 tablet  2 tablet Oral QHS Shelly Coss, MD   2 tablet at 02/05/20 2111  . traMADol (ULTRAM) tablet 50 mg  50 mg Oral Q6H PRN Shelly Coss, MD   50 mg at 02/06/20 0015     Discharge Medications: Please see discharge summary for a list of discharge medications.  Relevant Imaging Results:  Relevant Lab Results:   Additional Information SS# [id number]  [place], [place]

## 2020-02-07 LAB — SARS CORONAVIRUS 2 (TAT 6-24 HRS): SARS Coronavirus 2: NEGATIVE

## 2020-02-07 MED ORDER — AMLODIPINE BESYLATE 10 MG PO TABS: 10.0000 mg | ORAL_TABLET | Freq: Every day | ORAL | Status: DC

## 2020-02-07 MED ORDER — MELATONIN 3 MG PO TABS: 6.0000 mg | ORAL_TABLET | Freq: Every evening | ORAL | 0 refills | Status: DC | PRN

## 2020-02-07 MED ORDER — TRAMADOL HCL 50 MG PO TABS: 50.0000 mg | ORAL_TABLET | Freq: Four times a day (QID) | ORAL | 0 refills | Status: DC | PRN

## 2020-02-07 NOTE — TOC Progression Note (Addendum)
Transition of Care [facility]) - Progression Note    Patient Details  Name: Heather [name] MRN: [id number] Date of Birth: [birth year]  [facility] CM/SW Contact  Jacalyn Lefevre Edson Snowball, RN Phone Number: 02/07/2020, 12:41 PM  Clinical Narrative:     Bed offers given to patient and son [name] via phone.   [name] will discuss with family and call NCM back with decision. Patient will need a new covid will request order from MD.   Spoke to patient's son [name] , first choice is [facility] however [facility] no longer has a bed . Second choice is [facility]   Expected Discharge Plan: [place] Barriers to Discharge: Continued Medical Work up  Expected Discharge Plan and Services Expected Discharge Plan: [place]   Discharge Planning Services: CM Consult Post Acute Care Choice: Nursing Home Living arrangements for the past 2 months: Single Family Home Expected Discharge Date: 02/07/20               DME Arranged: N/A         HH Arranged: NA           Social Determinants of Health (SDOH) Interventions    Readmission Risk Interventions No flowsheet data found.

## 2020-02-07 NOTE — Consult Note (Signed)
   [facility] CM Inpatient Consult   02/07/2020  Heather [name] 06/26/1935 [id number]  Follow up:  Patient is currently being recommended for a skilled nursing facility stay.   No current [facility] Care Management needs for following, patient with [employer].  For questions, please contact:  Natividad Brood, RN BSN [facility] Liaison  [phone] business mobile phone Toll free office [phone]  Fax number: [phone] [email or web address]

## 2020-02-07 NOTE — Discharge Summary (Addendum)
Physician Discharge Summary  Heather [name] JHE:[id number] DOB: December 28, 1934 DOA: 02/03/2020  PCP: Marrian Salvage, FNP  Admit date: 02/03/2020 Discharge date: 02/08/2020   This Discharge Summary was dictated by Dr Shelly Coss on 3/2. I am obligated to update the d/c date today. No other changes have been made.   Admitted From: Home Disposition:  SNF Discharge Condition:Stable CODE STATUS: DNR Diet recommendation: Heart Healthy   Brief/Interim Summary:  Patient is 85 year old female with history of COPD, depression, coronary artery disease, hyperlipidemia, hypertension, hypothyroidism, irritable bowel syndrome, Crohn's disease, on multiple sedating medications at home who presented with sudden onset of syncope.  Reported that she passed out up to 5 minutes at home.  She was walking at the time when she fell.  No trauma of the head or any other history injuries.  She was having multiple falls lately.  She was admitted for the management of syncope most likely secondary to polypharmacy.  On presentation she was hemodynamically stable but mildly bradycardic.  PT/OT recommended skilled nursing facility.  She is hemodynamically stable for discharge to skilled nursing facility as soon as bed is available.  Following problems were addressed during her hospitalization:  Syncope: Currently hemodynamically stable but bradycardic.  EKG on presentation did not show heart block .  Echocardiogram showed ejection fraction of 55 to 18%, grade 1 diastolic function.  Blood pressure stable.  She was on Cardizem at home which has been discontinued.  Bradycardia: Could be responsible for syncope.  Cardizem held.  Echocardiogram did not show structural heart defects.  Rate much better now.  Polypharmacy: On pregabalin, Phenergan, sertraline, tramadol, trazodone, zolpidem at home.    Stop trazodone and zolpidem.  Hypothyroidism: Continue Synthyroid  COPD: Currently not in exacerbation.   Not on  oxygen at home   hypertension: Currently blood stable.Started on amlodipine  Coronary artery disease: Stable.  Does not complain of any chest pain.  Troponins negative.  Frequent falls /debility/deconditioning: Lives by herself.  PT/OT recommended skilled nursing facility.  Hypokalemia: Supplemented and corrected.  Chronic pain syndrome: On pain medications at home and complains of generalized pain.  Continue supportive care  Discharge Diagnoses:  Principal Problem:   Syncope and collapse Active Problems:   Hypothyroidism   Anxiety and depression   Essential hypertension   GERD   Hypoxia   COPD with emphysema [facility])    Discharge Instructions  Discharge Instructions    Diet - low sodium heart healthy   Complete by: As directed    Discharge instructions   Complete by: As directed    1)Take prescribed medications as instructed. 2)Do a BMP test in a week.   Increase activity slowly   Complete by: As directed      Allergies as of 02/08/2020      Reactions   Tylenol [acetaminophen] Other (See Comments)   Nightmares   Symbicort [budesonide-formoterol Fumarate] Other (See Comments)   Pt felt like her tongue was swollen, could not swallow      Medication List    STOP taking these medications   clorazepate 15 MG tablet Commonly known as: TRANXENE   diltiazem 240 MG 24 hr capsule Commonly known as: CARDIZEM CD   multivitamin with minerals Tabs tablet   traZODone 150 MG tablet Commonly known as: DESYREL   zolpidem 5 MG tablet Commonly known as: AMBIEN     TAKE these medications   Albuterol Sulfate 108 (90 Base) MCG/ACT Aepb Commonly known as: ProAir RespiClick Inhale 2 puffs into the lungs  4 (four) times daily as needed. What changed: reasons to take this   amLODipine 10 MG tablet Commonly known as: NORVASC Take 1 tablet (10 mg total) by mouth daily.   benzonatate 100 MG capsule Commonly known as: TESSALON TAKE 1 CAPSULE TWICE DAILY AS NEEDED FOR  COUGH. What changed: See the new instructions.   Breo Ellipta 200-25 MCG/INH Aepb Generic drug: fluticasone furoate-vilanterol INHALE 1 PUFF INTO THE LUNGS ONCE DAILY. What changed: See the new instructions.   esomeprazole 40 MG capsule Commonly known as: NEXIUM TAKE 1 CAPSULE DAILY BEFORE BREAKFAST. What changed: See the new instructions.   folic acid 1 MG tablet Commonly known as: FOLVITE TAKE 1 TABLET ONCE DAILY.   furosemide 20 MG tablet Commonly known as: LASIX TAKE 1 TABLET DAILY IF NEEDED FOR SIGNIFICANT LOWER EXTREMITY SWELLING. What changed:   how much to take  how to take this  when to take this  reasons to take this  additional instructions   levothyroxine 25 MCG tablet Commonly known as: SYNTHROID TAKE 1 TABLET DAILY BEFORE BREAKFAST.   Melatonin 3 MG Tabs Take 2 tablets (6 mg total) by mouth at bedtime as needed (sleep).   Myrbetriq 25 MG Tb24 tablet Generic drug: mirabegron ER TAKE 1 TABLET ONCE DAILY. What changed: how much to take   potassium chloride SA 20 MEQ tablet Commonly known as: KLOR-CON Take 1 tablet (20 mEq total) by mouth daily.   pregabalin 25 MG capsule Commonly known as: Lyrica Take 1 capsule (25 mg total) by mouth 2 (two) times daily.   promethazine 25 MG tablet Commonly known as: PHENERGAN TAKE 1 TABLET EVERY 8 HOURS AS NEEDED FOR NAUSEA & VOMITING. What changed: See the new instructions.   senna-docusate 8.6-50 MG tablet Commonly known as: Senokot-S Take 2 tablets by mouth at bedtime. What changed:   when to take this  reasons to take this   sertraline 50 MG tablet Commonly known as: ZOLOFT TAKE 1 TABLET EACH DAY. What changed: See the new instructions.   traMADol 50 MG tablet Commonly known as: ULTRAM Take 1 tablet (50 mg total) by mouth every 6 (six) hours as needed for moderate pain. Take 1tab every 4-6 hours prn pain What changed:   how much to take  how to take this  when to take this  reasons to  take this  additional instructions   VITAMIN D3 PO Take 1 tablet by mouth daily.       Contact information for follow-up providers    Schedule an appointment as soon as possible for a visit  with Marrian Salvage, [place].   Specialty: Internal Medicine Contact information: [place] [address] [phone]            Contact information for after-discharge care    Destination    [facility] .   Service: Skilled Nursing Contact information: [address] [address] [phone]                 Allergies  Allergen Reactions  . Tylenol [Acetaminophen] Other (See Comments)    Nightmares  . Symbicort [Budesonide-Formoterol Fumarate] Other (See Comments)    Pt felt like her tongue was swollen, could not swallow    Consultations:  None   Procedures/Studies: DG Ribs Unilateral W/Chest Right  Result Date: 02/03/2020 CLINICAL DATA:  Fall with right-sided chest pain EXAM: RIGHT RIBS AND CHEST - 3+ VIEW COMPARISON:  04/22/2019, CT 10/14/2019 FINDINGS:  Single-view chest demonstrates emphysematous disease. Pleural effusions are better seen on chest CT. Irregular consolidation right lung base. Normal heart size. Aortic atherosclerosis. No pneumothorax. Branching calcification at the right hilus, appears to be intravascular on CT and related to pulmonary artery, no change since 10/14/2019. Right rib series demonstrates no acute displaced right rib fracture. IMPRESSION: 1. No definite acute displaced right rib fracture.  No pneumothorax. 2. Irregular bandlike consolidations at the right lung base corresponding to CT consolidations. Electronically Signed   By: Donavan Foil M.D.   On: 02/03/2020 22:16   CT Head Wo Contrast  Result Date: 02/03/2020 CLINICAL DATA:  Syncope, fell forward EXAM: CT HEAD WITHOUT CONTRAST TECHNIQUE: Contiguous axial images were obtained from the base of the skull through the  vertex without intravenous contrast. COMPARISON:  10/31/2019 FINDINGS: Brain: Stable chronic small-vessel ischemic changes throughout the periventricular white matter. No acute infarct or hemorrhage. Lateral ventricles and midline structures are unremarkable. No acute extra-axial fluid collections. No mass effect. Vascular: No hyperdense vessel or unexpected calcification. Skull: Normal. Negative for fracture or focal lesion. Sinuses/Orbits: Minimal polypoid mucosal thickening right maxillary sinus, stable. Otherwise the sinuses are clear. Other: None IMPRESSION: 1. Chronic small vessel ischemic changes.  No acute process. Electronically Signed   By: Randa Ngo M.D.   On: 02/03/2020 21:46   CT Chest Wo Contrast  Result Date: 02/03/2020 CLINICAL DATA:  Trauma, fall sternal tenderness EXAM: CT CHEST WITHOUT CONTRAST TECHNIQUE: Multidetector CT imaging of the chest was performed following the standard protocol without IV contrast. COMPARISON:  Radiograph 02/03/2020, CT chest 10/14/2019, CT 07/04/2019, PET CT 06/28/2018, 02/18/2019 FINDINGS: Cardiovascular: Limited evaluation without intravenous contrast. Moderate aortic atherosclerosis. No aneurysmal dilatation. Coronary vascular calcification. No pericardial effusion. Mediastinum/Nodes: Midline trachea. No thyroid mass. No suspicious adenopathy. Esophagus is within normal limits. Lungs/Pleura: Emphysema. Small bilateral pleural effusions. Progressive masslike consolidation in the right lower lobe, now measuring 6.2 x 4.2 cm, series 8 image number 109. Architectural distortion in this region. Consolidation has become more confluent in the interim. Areas of bandlike density remain. Mild ground-glass density the periphery. Upper Abdomen: No acute abnormality. Musculoskeletal: No acute fracture or malalignment is seen. The sternum is intact. IMPRESSION: 1. Negative for pneumothorax or rib fracture. 2. Small bilateral pleural effusions 3. Interval progression and  increased size of masslike consolidation at the right lower lobe with associated architectural distortion, possibly due to continued evolution of post radiation change though continued short interval surveillance is recommend to exclude tumor recurrence given the increased size of consolidative masslike region. 4. Emphysema Aortic Atherosclerosis (ICD10-I70.0) and Emphysema (ICD10-J43.9). Electronically Signed   By: Donavan Foil M.D.   On: 02/03/2020 22:11   CT Cervical Spine Wo Contrast  Result Date: 02/03/2020 CLINICAL DATA:  Syncope, fell forward EXAM: CT CERVICAL SPINE WITHOUT CONTRAST TECHNIQUE: Multidetector CT imaging of the cervical spine was performed without intravenous contrast. Multiplanar CT image reconstructions were also generated. COMPARISON:  10/31/2019 FINDINGS: Alignment: Alignment is grossly anatomic and stable. Skull base and vertebrae: No acute displaced fractures. Soft tissues and spinal canal: No prevertebral fluid or swelling. No visible canal hematoma. Disc levels: Stable multilevel cervical spondylosis, with disc space narrowing and osteophyte formation most pronounced at C5-6 and C6-7. Upper chest: Significant emphysema within the upper lobes. Airway is patent. Other: Reconstructed images demonstrate no additional findings. IMPRESSION: 1. Stable cervical spondylosis.  No acute fracture. Electronically Signed   By: Randa Ngo M.D.   On: 02/03/2020 21:48   ECHOCARDIOGRAM COMPLETE  Result Date:  02/04/2020    ECHOCARDIOGRAM REPORT   Patient Name:   EVEREST HACKING [facility] Date of Exam: 02/04/2020 Medical Rec #:  [id number]           Height:       59.0 in Accession #:    [id number]          Weight:       99.2 lb Date of Birth:  09/11/1935           BSA:          1.369 m Patient Age:    81 years            BP:           98/65 mmHg Patient Gender: F                   HR:           61 bpm. Exam Location:  Inpatient Procedure: 2D Echo, Cardiac Doppler and Color Doppler Indications:     Syncope 780.2  History:        Patient has prior history of Echocardiogram examinations, most                 recent 08/14/2014. COPD; Risk Factors:Hypertension, Dyslipidemia                 and Former Smoker. GERD.  Sonographer:    Vickie Epley RDCS Referring Phys: [id number] AMRIT ADHIKARI IMPRESSIONS  1. Left ventricular ejection fraction, by estimation, is 55 to 60%. The left ventricle has normal function. The left ventricle has no regional wall motion abnormalities. Left ventricular diastolic parameters are consistent with Grade I diastolic dysfunction (impaired relaxation).  2. Right ventricular systolic function is normal. The right ventricular size is normal. There is mildly elevated pulmonary artery systolic pressure.  3. The mitral valve is grossly normal. Mild mitral valve regurgitation.  4. Tricuspid valve regurgitation is mild to moderate.  5. The aortic valve is grossly normal. Aortic valve regurgitation is trivial.  6. The inferior vena cava is dilated in size with <50% respiratory variability, suggesting right atrial pressure of 15 mmHg. FINDINGS  Left Ventricle: Left ventricular ejection fraction, by estimation, is 55 to 60%. The left ventricle has normal function. The left ventricle has no regional wall motion abnormalities. The left ventricular internal cavity size was normal in size. There is  no left ventricular hypertrophy. Left ventricular diastolic parameters are consistent with Grade I diastolic dysfunction (impaired relaxation). Right Ventricle: The right ventricular size is normal. No increase in right ventricular wall thickness. Right ventricular systolic function is normal. There is mildly elevated pulmonary artery systolic pressure. The tricuspid regurgitant velocity is 2.40  m/s, and with an assumed right atrial pressure of 15 mmHg, the estimated right ventricular systolic pressure is 10.1 mmHg. Left Atrium: Left atrial size was normal in size. Right Atrium: Right atrial size was normal in  size. Pericardium: There is no evidence of pericardial effusion. Mitral Valve: The mitral valve is grossly normal. Mild mitral annular calcification. Mild mitral valve regurgitation. Tricuspid Valve: The tricuspid valve is normal in structure. Tricuspid valve regurgitation is mild to moderate. Aortic Valve: The aortic valve is grossly normal. Aortic valve regurgitation is trivial. Pulmonic Valve: The pulmonic valve was normal in structure. Pulmonic valve regurgitation is not visualized. Aorta: The aortic root is normal in size and structure. Venous: The inferior vena cava is dilated in size with less than 50% respiratory variability, suggesting right atrial pressure of  15 mmHg. IAS/Shunts: No atrial level shunt detected by color flow Doppler.  LEFT VENTRICLE PLAX 2D LVIDd:         3.70 cm     Diastology LVIDs:         2.80 cm     LV e' lateral:   6.73 cm/s LV PW:         0.80 cm     LV E/e' lateral: 13.0 LV IVS:        0.80 cm     LV e' medial:    5.35 cm/s LVOT diam:     1.50 cm     LV E/e' medial:  16.4 LV SV:         43 LV SV Index:   32 LVOT Area:     1.77 cm  LV Volumes (MOD) LV vol d, MOD A2C: 66.4 ml LV vol d, MOD A4C: 64.8 ml LV vol s, MOD A2C: 29.6 ml LV vol s, MOD A4C: 28.5 ml LV SV MOD A2C:     36.8 ml LV SV MOD A4C:     64.8 ml LV SV MOD BP:      36.8 ml RIGHT VENTRICLE RV S prime:     13.20 cm/s TAPSE (M-mode): 1.8 cm LEFT ATRIUM             Index       RIGHT ATRIUM          Index LA diam:        2.50 cm 1.83 cm/m  RA Area:     7.77 cm LA Vol (A2C):   26.9 ml 19.65 ml/m RA Volume:   14.00 ml 10.23 ml/m LA Vol (A4C):   31.2 ml 22.79 ml/m LA Biplane Vol: 29.2 ml 21.33 ml/m  AORTIC VALVE LVOT Vmax:   105.00 cm/s LVOT Vmean:  66.300 cm/s LVOT VTI:    0.246 m  AORTA Ao Root diam: 3.20 cm MITRAL VALVE               TRICUSPID VALVE MV Area (PHT): 3.53 cm    TV Peak grad:   31.1 mmHg MV Decel Time: 215 msec    TV Vmax:        2.79 m/s MV E velocity: 87.50 cm/s  TR Peak grad:   23.0 mmHg MV A velocity:  90.30 cm/s  TR Vmax:        240.00 cm/s MV E/A ratio:  0.97                            SHUNTS                            Systemic VTI:  0.25 m                            Systemic Diam: 1.50 cm Dorris Carnes MD Electronically signed by Dorris Carnes MD Signature Date/Time: 02/04/2020/1:40:13 PM    Final    DG Hip Unilat With Pelvis 2-3 Views Left  Result Date: 02/03/2020 CLINICAL DATA:  Fall EXAM: DG HIP (WITH OR WITHOUT PELVIS) 2-3V LEFT COMPARISON:  CT 10/31/2019 FINDINGS: Pubic symphysis and rami appear intact. No fracture or malalignment. Joint space is maintained. IMPRESSION: No acute osseous abnormality. Electronically Signed   By: Donavan Foil M.D.   On: 02/03/2020 22:17  Subjective: Patient seen and examined at the bedside this morning.  Hemodynamically stable for discharge.  Discharge Exam: Vitals:   02/08/20 0514 02/08/20 0802  BP: (!) 156/55   Pulse: 71 71  Resp: 16 16  Temp: 97.8 F (36.6 C)   SpO2: 97% 95%   Vitals:   02/07/20 1906 02/07/20 2332 02/08/20 0514 02/08/20 0802  BP: (!) 124/54 (!) 153/65 (!) 156/55   Pulse: 76 78 71 71  Resp: 16 16 16 16   Temp: 97.8 F (36.6 C) 97.6 F (36.4 C) 97.8 F (36.6 C)   TempSrc: Oral Oral Oral   SpO2: 94% 97% 97% 95%  Weight:      Height:        General: Pt is alert, awake, not in acute distress, generalized weakness Cardiovascular: RRR, S1/S2 +, no rubs, no gallops Respiratory: CTA bilaterally, no wheezing, no rhonchi Abdominal: Soft, NT, ND, bowel sounds + Extremities: no edema, no cyanosis    The results of significant diagnostics from this hospitalization (including imaging, microbiology, ancillary and laboratory) are listed below for reference.     Microbiology: Recent Results (from the past 240 hour(s))  SARS CORONAVIRUS 2 (TAT 6-24 HRS) Nasopharyngeal Nasopharyngeal Swab     Status: None   Collection Time: 02/03/20 10:50 PM   Specimen: Nasopharyngeal Swab  Result Value Ref Range Status   SARS Coronavirus 2  NEGATIVE NEGATIVE Final    Comment: (NOTE) SARS-CoV-2 target nucleic acids are NOT DETECTED. The SARS-CoV-2 RNA is generally detectable in upper and lower respiratory specimens during the acute phase of infection. Negative results do not preclude SARS-CoV-2 infection, do not rule out co-infections with other pathogens, and should not be used as the sole basis for treatment or other patient management decisions. Negative results must be combined with clinical observations, patient history, and epidemiological information. The expected result is Negative. Fact Sheet for Patients: SugarRoll.be Fact Sheet for Healthcare Providers: https://www.woods-mathews.com/ This test is not yet approved or cleared by the [place] FDA and  has been authorized for detection and/or diagnosis of SARS-CoV-2 by FDA under an Emergency Use Authorization (EUA). This EUA will remain  in effect (meaning this test can be used) for the duration of the COVID-19 declaration under Section 56 4(b)(1) of the Act, 21 U.S.C. section 360bbb-3(b)(1), unless the authorization is terminated or revoked sooner. Performed at [facility], [place] [address], [place], [place] [address]   SARS CORONAVIRUS 2 (TAT 6-24 HRS) Nasopharyngeal Nasopharyngeal Swab     Status: None   Collection Time: 02/07/20  2:00 PM   Specimen: Nasopharyngeal Swab  Result Value Ref Range Status   SARS Coronavirus 2 NEGATIVE NEGATIVE Final    Comment: (NOTE) SARS-CoV-2 target nucleic acids are NOT DETECTED. The SARS-CoV-2 RNA is generally detectable in upper and lower respiratory specimens during the acute phase of infection. Negative results do not preclude SARS-CoV-2 infection, do not rule out co-infections with other pathogens, and should not be used as the sole basis for treatment or other patient management decisions. Negative results must be combined with clinical observations, patient history,  and epidemiological information. The expected result is Negative. Fact Sheet for Patients: SugarRoll.be Fact Sheet for Healthcare Providers: https://www.woods-mathews.com/ This test is not yet approved or cleared by the [place] FDA and  has been authorized for detection and/or diagnosis of SARS-CoV-2 by FDA under an Emergency Use Authorization (EUA). This EUA will remain  in effect (meaning this test can be used) for the duration of the COVID-19  declaration under Section 56 4(b)(1) of the Act, 21 U.S.C. section 360bbb-3(b)(1), unless the authorization is terminated or revoked sooner. Performed at [facility], [place] [address], [place], [place] [address]      Labs: BNP (last 3 results) No results for input(s): BNP in the last 8760 hours. Basic Metabolic Panel: Recent Labs  Lab 02/03/20 1815 02/04/20 1013 02/05/20 0455  NA 136 137 142  K 3.5 3.2* 3.6  CL 101 103 109  CO2 22 25 24   GLUCOSE 120* 128* 107*  BUN 7* <5* <5*  CREATININE 0.90 0.75 0.64  CALCIUM 8.6* 8.5* 8.9   Liver Function Tests: Recent Labs  Lab 02/03/20 1815 02/04/20 1013  AST 13* 16  ALT 9 5  ALKPHOS 76 69  BILITOT 0.2* 0.4  PROT 5.8* 5.8*  ALBUMIN 3.2* 3.0*   No results for input(s): LIPASE, AMYLASE in the last 168 hours. No results for input(s): AMMONIA in the last 168 hours. CBC: Recent Labs  Lab 02/03/20 1815 02/04/20 1132  WBC 5.9 6.0  NEUTROABS 4.2 4.0  HGB 9.9* 9.6*  HCT 33.7* 32.3*  MCV 88.0 85.9  PLT 358 322   Cardiac Enzymes: Recent Labs  Lab 02/04/20 1132  CKTOTAL 31*   BNP: Invalid input(s): POCBNP CBG: No results for input(s): GLUCAP in the last 168 hours. D-Dimer No results for input(s): DDIMER in the last 72 hours. Hgb A1c No results for input(s): HGBA1C in the last 72 hours. Lipid Profile No results for input(s): CHOL, HDL, LDLCALC, TRIG, CHOLHDL, LDLDIRECT in the last 72 hours. Thyroid function studies No  results for input(s): TSH, T4TOTAL, T3FREE, THYROIDAB in the last 72 hours.  Invalid input(s): FREET3 Anemia work up No results for input(s): VITAMINB12, FOLATE, FERRITIN, TIBC, IRON, RETICCTPCT in the last 72 hours. Urinalysis    Component Value Date/Time   COLORURINE YELLOW 02/03/2020 2241   APPEARANCEUR CLEAR 02/03/2020 2241   LABSPEC 1.015 02/03/2020 2241   PHURINE 6.0 02/03/2020 2241   GLUCOSEU NEGATIVE 02/03/2020 2241   GLUCOSEU NEGATIVE 06/15/2019 1324   HGBUR NEGATIVE 02/03/2020 2241   HGBUR trace-lysed 11/15/2010 1046   BILIRUBINUR NEGATIVE 02/03/2020 2241   KETONESUR 20 (A) 02/03/2020 2241   PROTEINUR NEGATIVE 02/03/2020 2241   UROBILINOGEN 0.2 06/15/2019 1324   NITRITE NEGATIVE 02/03/2020 2241   LEUKOCYTESUR NEGATIVE 02/03/2020 2241   Sepsis Labs Invalid input(s): PROCALCITONIN,  WBC,  LACTICIDVEN Microbiology Recent Results (from the past 240 hour(s))  SARS CORONAVIRUS 2 (TAT 6-24 HRS) Nasopharyngeal Nasopharyngeal Swab     Status: None   Collection Time: 02/03/20 10:50 PM   Specimen: Nasopharyngeal Swab  Result Value Ref Range Status   SARS Coronavirus 2 NEGATIVE NEGATIVE Final    Comment: (NOTE) SARS-CoV-2 target nucleic acids are NOT DETECTED. The SARS-CoV-2 RNA is generally detectable in upper and lower respiratory specimens during the acute phase of infection. Negative results do not preclude SARS-CoV-2 infection, do not rule out co-infections with other pathogens, and should not be used as the sole basis for treatment or other patient management decisions. Negative results must be combined with clinical observations, patient history, and epidemiological information. The expected result is Negative. Fact Sheet for Patients: SugarRoll.be Fact Sheet for Healthcare Providers: https://www.woods-mathews.com/ This test is not yet approved or cleared by the [place] FDA and  has been authorized for detection and/or  diagnosis of SARS-CoV-2 by FDA under an Emergency Use Authorization (EUA). This EUA will remain  in effect (meaning this test can be used) for the duration  of the COVID-19 declaration under Section 56 4(b)(1) of the Act, 21 U.S.C. section 360bbb-3(b)(1), unless the authorization is terminated or revoked sooner. Performed at [facility], [place] [address], [place], [place] [address]   SARS CORONAVIRUS 2 (TAT 6-24 HRS) Nasopharyngeal Nasopharyngeal Swab     Status: None   Collection Time: 02/07/20  2:00 PM   Specimen: Nasopharyngeal Swab  Result Value Ref Range Status   SARS Coronavirus 2 NEGATIVE NEGATIVE Final    Comment: (NOTE) SARS-CoV-2 target nucleic acids are NOT DETECTED. The SARS-CoV-2 RNA is generally detectable in upper and lower respiratory specimens during the acute phase of infection. Negative results do not preclude SARS-CoV-2 infection, do not rule out co-infections with other pathogens, and should not be used as the sole basis for treatment or other patient management decisions. Negative results must be combined with clinical observations, patient history, and epidemiological information. The expected result is Negative. Fact Sheet for Patients: SugarRoll.be Fact Sheet for Healthcare Providers: https://www.woods-mathews.com/ This test is not yet approved or cleared by the [place] FDA and  has been authorized for detection and/or diagnosis of SARS-CoV-2 by FDA under an Emergency Use Authorization (EUA). This EUA will remain  in effect (meaning this test can be used) for the duration of the COVID-19 declaration under Section 56 4(b)(1) of the Act, 21 U.S.C. section 360bbb-3(b)(1), unless the authorization is terminated or revoked sooner. Performed at [facility], [place] [address] [place], [place] [address]     Please note: You were cared for by a hospitalist during your hospital stay. Once you are  discharged, your primary care physician will handle any further medical issues. Please note that NO REFILLS for any discharge medications will be authorized once you are discharged, as it is imperative that you return to your primary care physician (or establish a relationship with a primary care physician if you do not have one) for your post hospital discharge needs so that they can reassess your need for medications and monitor your lab values.    Time coordinating discharge: 40 minutes  SIGNED:   Debbe Odea, MD  [facility] 02/08/2020, 11:49 AM Pager [phone]  If 7PM-7AM, please contact night-coverage [email or web address] Password [email or web address]

## 2020-02-07 NOTE — Progress Notes (Signed)
Physical Therapy Treatment Patient Details Name: Heather [name] MRN: [id number] DOB: 07-26-1935 Today's Date: 02/07/2020    History of Present Illness 85 yo admitted after syncope at home. PMHx: COPD, hypothyroidism, anxiety, HTN, HLD, depression, Crohns    PT Comments    Pt progressing towards physical therapy goals. Overall good rehab effort this afternoon however requires increased time and emotional at times. No overt LOB noted throughout OOB mobility, but close guard was provided throughout for safety. Will continue to follow and progress as able per POC.     Follow Up Recommendations  SNF;Supervision/Assistance - 24 hour     Equipment Recommendations  None recommended by PT    Recommendations for Other Services       Precautions / Restrictions Precautions Precautions: Fall Restrictions Weight Bearing Restrictions: No    Mobility  Bed Mobility Overal bed mobility: Needs Assistance Bed Mobility: Supine to Sit;Sit to Supine     Supine to sit: Supervision Sit to supine: Supervision   General bed mobility comments: No assist to transition to/from EOB. HOB flat for return to supine  Transfers Overall transfer level: Needs assistance Equipment used: Rolling walker (2 wheeled) Transfers: Sit to/from Stand Sit to Stand: Min guard         General transfer comment: Close guard for power-up to full standing position. Pt with good hand placement on seated surface for safety both from EOB and BSC.   Ambulation/Gait Ambulation/Gait assistance: Min guard;Supervision Gait Distance (Feet): 120 Feet Assistive device: Rolling walker (2 wheeled) Gait Pattern/deviations: Step-through pattern;Decreased stride length;Trunk flexed Gait velocity: Decreased Gait velocity interpretation: <1.31 ft/sec, indicative of household ambulator General Gait Details: Pt ambulated around room for bathroom use/washing hands, and then out to hallway for further ambulation. ~120 feet total.  Pt fatiguing fairly quickly but motivated for distance.    Stairs             Wheelchair Mobility    Modified Rankin (Stroke Patients Only)       Balance Overall balance assessment: History of Falls                                          Cognition Arousal/Alertness: Awake/alert Behavior During Therapy: WFL for tasks assessed/performed Overall Cognitive Status: No family/caregiver present to determine baseline cognitive functioning Area of Impairment: Problem solving                             Problem Solving: Slow processing General Comments: Pt reports feeling sad today      Exercises      General Comments        Pertinent Vitals/Pain Pain Assessment: Faces Faces Pain Scale: Hurts even more Pain Location: "All over" but specifies hips as well Pain Descriptors / Indicators: Discomfort;Aching Pain Intervention(s): Limited activity within patient's tolerance;Monitored during session;Repositioned    Home Living                      Prior Function            PT Goals (current goals can now be found in the care plan section) Acute Rehab PT Goals Patient Stated Goal: be able to have help at home PT Goal Formulation: With patient Time For Goal Achievement: 02/19/20 Potential to Achieve Goals: Fair Progress towards PT goals: Progressing toward goals  Frequency    Min 3X/week      PT Plan Current plan remains appropriate    Co-evaluation              AM-PAC PT "6 Clicks" Mobility   Outcome Measure  Help needed turning from your back to your side while in a flat bed without using bedrails?: A Little Help needed moving from lying on your back to sitting on the side of a flat bed without using bedrails?: A Little Help needed moving to and from a bed to a chair (including a wheelchair)?: A Little Help needed standing up from a chair using your arms (e.g., wheelchair or bedside chair)?: A Little Help  needed to walk in hospital room?: A Little Help needed climbing 3-5 steps with a railing? : A Lot 6 Click Score: 17    End of Session Equipment Utilized During Treatment: Gait belt Activity Tolerance: Patient tolerated treatment well Patient left: in bed;with bed alarm set;with call bell/phone within reach;with family/visitor present(at end of session) Nurse Communication: Mobility status PT Visit Diagnosis: Other abnormalities of gait and mobility (R26.89);Difficulty in walking, not elsewhere classified (R26.2);Muscle weakness (generalized) (M62.81)     Time: 1415-1440 PT Time Calculation (min) (ACUTE ONLY): 25 min  Charges:  $Gait Training: 23-37 mins                     Rolinda Roan, PT, DPT Acute Rehabilitation Services Pager: [phone] Office: [phone]    Thelma Comp 02/07/2020, 2:48 PM

## 2020-02-08 DIAGNOSIS — J449 Chronic obstructive pulmonary disease, unspecified: Secondary | ICD-10-CM | POA: Diagnosis not present

## 2020-02-08 DIAGNOSIS — Z79899 Other long term (current) drug therapy: Secondary | ICD-10-CM | POA: Diagnosis not present

## 2020-02-08 DIAGNOSIS — I10 Essential (primary) hypertension: Secondary | ICD-10-CM | POA: Diagnosis not present

## 2020-02-08 DIAGNOSIS — R001 Bradycardia, unspecified: Secondary | ICD-10-CM | POA: Diagnosis not present

## 2020-02-08 DIAGNOSIS — M2550 Pain in unspecified joint: Secondary | ICD-10-CM | POA: Diagnosis not present

## 2020-02-08 DIAGNOSIS — U071 COVID-19: Secondary | ICD-10-CM | POA: Diagnosis not present

## 2020-02-08 DIAGNOSIS — J439 Emphysema, unspecified: Secondary | ICD-10-CM | POA: Diagnosis not present

## 2020-02-08 DIAGNOSIS — R55 Syncope and collapse: Secondary | ICD-10-CM | POA: Diagnosis not present

## 2020-02-08 DIAGNOSIS — E039 Hypothyroidism, unspecified: Secondary | ICD-10-CM | POA: Diagnosis not present

## 2020-02-08 DIAGNOSIS — G8929 Other chronic pain: Secondary | ICD-10-CM | POA: Diagnosis not present

## 2020-02-08 DIAGNOSIS — I2510 Atherosclerotic heart disease of native coronary artery without angina pectoris: Secondary | ICD-10-CM | POA: Diagnosis not present

## 2020-02-08 DIAGNOSIS — Z7401 Bed confinement status: Secondary | ICD-10-CM | POA: Diagnosis not present

## 2020-02-08 DIAGNOSIS — Z743 Need for continuous supervision: Secondary | ICD-10-CM | POA: Diagnosis not present

## 2020-02-08 DIAGNOSIS — W19XXXA Unspecified fall, initial encounter: Secondary | ICD-10-CM | POA: Diagnosis not present

## 2020-02-08 DIAGNOSIS — D649 Anemia, unspecified: Secondary | ICD-10-CM | POA: Diagnosis not present

## 2020-02-08 DIAGNOSIS — E785 Hyperlipidemia, unspecified: Secondary | ICD-10-CM | POA: Diagnosis not present

## 2020-02-08 DIAGNOSIS — Z760 Encounter for issue of repeat prescription: Secondary | ICD-10-CM | POA: Diagnosis not present

## 2020-02-08 NOTE — TOC Transition Note (Signed)
Transition of Care [facility]) - CM/SW Discharge Note   Patient Details  Name: Heather [name] MRN: [id number] Date of Birth: 02/08/1935  [facility] CM/SW Contact:  Alexander Mt, LCSW Phone Number: 02/08/2020, 11:22 AM   Clinical Narrative:    CSW spoke with [facility], they have received pt authorization and aware of negative COVID. Pt son has completed paperwork for SNF. Await new dc summary with todays date to arrange PTAR.    Final next level of care: Skilled Nursing Facility Barriers to Discharge: Barriers Resolved   Patient Goals and CMS Choice Patient states their goals for this hospitalization and ongoing recovery are:: to get stronger CMS Medicare.gov Compare Post Acute Care list provided to:: Patient Represenative (must comment)(adult son) Choice offered to / list presented to : Adult Children  Discharge Placement   Existing PASRR number confirmed : 02/06/20          Patient chooses bed at: Other - please specify in the comment section below:([facility]) Patient to be transferred to facility by: [facility] Name of family member notified: pt son Patient and family notified of of transfer: 02/08/20  Discharge Plan and Services Discharge Planning Services: CM Consult Post Acute Care Choice: Nursing Home          DME Arranged: N/A HH Arranged: NA   Readmission Risk Interventions No flowsheet data found.

## 2020-02-08 NOTE — Progress Notes (Signed)
Occupational Therapy Treatment Patient Details Name: Heather [name] MRN: [id number] DOB: [birth year] Today's Date: 02/08/2020    History of present illness 85 yo admitted after syncope at home. PMHx: COPD, hypothyroidism, anxiety, HTN, HLD, depression, Crohns   OT comments  Pt ambulated to sink to perform oral care, performed seated shampoo cap and was agreeable to sitting up in chair at end of session.  Educated on energy conservation/rest breaks.    Follow Up Recommendations  SNF    Equipment Recommendations  (defer to next venue)    Recommendations for Other Services      Precautions / Restrictions Precautions Precautions: Fall       Mobility Bed Mobility               General bed mobility comments: min A via sidelying to sit due to back pain  Transfers   Equipment used: Rolling walker (2 wheeled)   Sit to Stand: Min guard         General transfer comment: Close guard for power-up to full standing position. Pt with good hand placement on seated surface for safety both from EOB and BSC.     Balance Overall balance assessment: History of Falls                                         ADL either performed or assessed with clinical judgement   ADL       Grooming: Oral care;Min guard;Standing; sat EOB for shampoo cap (max A) and set up with comb for her hair.  Pt's son called and she was attending to things with him at end of session                   Toilet Transfer: Min guard;Ambulation;RW(to bed then chair, cues for safety)             General ADL Comments: also sat for shampoo cap; it was pt's priority to get her hair washed today     Vision       Perception     Praxis      Cognition Arousal/Alertness: Awake/alert Behavior During Therapy: WFL for tasks assessed/performed Overall Cognitive Status: No family/caregiver present to determine baseline cognitive functioning                                 General Comments: cues for safety with RW when MD came into room        Exercises     Shoulder Instructions       General Comments      Pertinent Vitals/ Pain       Pain Assessment: Faces Faces Pain Scale: Hurts even more Pain Location: low back Pain Descriptors / Indicators: Discomfort;Aching Pain Intervention(s): Limited activity within patient's tolerance;Monitored during session;Premedicated before session;Repositioned  Home Living                                          Prior Functioning/Environment              Frequency  Min 2X/week        Progress Toward Goals  OT Goals(current goals can now be found in the care plan section)  Progress towards OT  goals: Progressing toward goals     Plan      Co-evaluation                 AM-PAC OT "6 Clicks" Daily Activity     Outcome Measure   Help from another person eating meals?: None Help from another person taking care of personal grooming?: A Little Help from another person toileting, which includes using toliet, bedpan, or urinal?: A Lot Help from another person bathing (including washing, rinsing, drying)?: A Lot Help from another person to put on and taking off regular upper body clothing?: A Little Help from another person to put on and taking off regular lower body clothing?: A Lot 6 Click Score: 16    End of Session    OT Visit Diagnosis: Muscle weakness (generalized) (M62.81)   Activity Tolerance Patient tolerated treatment well   Patient Left in chair;with call bell/phone within reach;with chair alarm set   Nurse Communication          Time: [phone] OT Time Calculation (min): 37 min  Charges: OT General Charges $OT Visit: 1 Visit OT Treatments $Self Care/Home Management : 23-37 mins  Ronaldo Crilly S, OTR/L Acute Rehabilitation Services 02/08/2020   [place] 02/08/2020, 10:43 AM

## 2020-02-08 NOTE — Care Management (Signed)
Spoke to patient's nurse Meda Coffee. He will call report to [facility] . [facility] requesting PTAR to be arranged for 1400 . Called will leave PTAR paperwork at [place] [facility]. Asked Dr Wynelle Cleveland to sign DNR.   Called patient son [name] [phone] and he is aware.  Magdalen Spatz RN

## 2020-02-08 NOTE — Progress Notes (Signed)
Assumed care from Vito Berger RN. Patient stable and comfortable in bed.

## 2020-02-08 NOTE — Progress Notes (Signed)
Report given to harriet at [employer]. Patient DC to SNF AOX4 no distress. Waiting for transport

## 2020-02-08 NOTE — Social Work (Signed)
Clinical Social Worker facilitated patient discharge including contacting patient family and facility to confirm patient discharge plans.  Clinical information faxed to facility and family agreeable with plan.  CSW will arrange ambulance transport via PTAR to [place] to call [phone]  with report prior to discharge.  Clinical Social Worker will sign off for now as social work intervention is no longer needed. Please consult [place] again if new need arises.  Westley Hummer, MSW, LCSW Clinical Social Worker

## 2020-02-09 DIAGNOSIS — E039 Hypothyroidism, unspecified: Secondary | ICD-10-CM | POA: Diagnosis not present

## 2020-02-09 DIAGNOSIS — R001 Bradycardia, unspecified: Secondary | ICD-10-CM | POA: Diagnosis not present

## 2020-02-09 DIAGNOSIS — R55 Syncope and collapse: Secondary | ICD-10-CM | POA: Diagnosis not present

## 2020-02-09 DIAGNOSIS — J449 Chronic obstructive pulmonary disease, unspecified: Secondary | ICD-10-CM | POA: Diagnosis not present

## 2020-02-10 DIAGNOSIS — J449 Chronic obstructive pulmonary disease, unspecified: Secondary | ICD-10-CM | POA: Diagnosis not present

## 2020-02-10 DIAGNOSIS — R55 Syncope and collapse: Secondary | ICD-10-CM | POA: Diagnosis not present

## 2020-02-10 DIAGNOSIS — R001 Bradycardia, unspecified: Secondary | ICD-10-CM | POA: Diagnosis not present

## 2020-02-10 DIAGNOSIS — I2510 Atherosclerotic heart disease of native coronary artery without angina pectoris: Secondary | ICD-10-CM | POA: Diagnosis not present

## 2020-02-17 DIAGNOSIS — E039 Hypothyroidism, unspecified: Secondary | ICD-10-CM | POA: Diagnosis not present

## 2020-02-17 DIAGNOSIS — R001 Bradycardia, unspecified: Secondary | ICD-10-CM | POA: Diagnosis not present

## 2020-02-17 DIAGNOSIS — R55 Syncope and collapse: Secondary | ICD-10-CM | POA: Diagnosis not present

## 2020-02-17 DIAGNOSIS — G8929 Other chronic pain: Secondary | ICD-10-CM | POA: Diagnosis not present

## 2020-02-24 DIAGNOSIS — G8929 Other chronic pain: Secondary | ICD-10-CM | POA: Diagnosis not present

## 2020-02-24 DIAGNOSIS — I10 Essential (primary) hypertension: Secondary | ICD-10-CM | POA: Diagnosis not present

## 2020-02-24 DIAGNOSIS — J449 Chronic obstructive pulmonary disease, unspecified: Secondary | ICD-10-CM | POA: Diagnosis not present

## 2020-02-24 DIAGNOSIS — I2510 Atherosclerotic heart disease of native coronary artery without angina pectoris: Secondary | ICD-10-CM | POA: Diagnosis not present

## 2020-02-25 DIAGNOSIS — R55 Syncope and collapse: Secondary | ICD-10-CM | POA: Diagnosis not present

## 2020-02-25 DIAGNOSIS — W19XXXA Unspecified fall, initial encounter: Secondary | ICD-10-CM | POA: Diagnosis not present

## 2020-02-25 DIAGNOSIS — J449 Chronic obstructive pulmonary disease, unspecified: Secondary | ICD-10-CM | POA: Diagnosis not present

## 2020-02-25 DIAGNOSIS — R54 Age-related physical debility: Secondary | ICD-10-CM | POA: Diagnosis not present

## 2020-02-25 DIAGNOSIS — M545 Low back pain: Secondary | ICD-10-CM | POA: Diagnosis not present

## 2020-02-27 DIAGNOSIS — J449 Chronic obstructive pulmonary disease, unspecified: Secondary | ICD-10-CM | POA: Diagnosis not present

## 2020-02-27 DIAGNOSIS — R55 Syncope and collapse: Secondary | ICD-10-CM | POA: Diagnosis not present

## 2020-02-27 DIAGNOSIS — M545 Low back pain: Secondary | ICD-10-CM | POA: Diagnosis not present

## 2020-02-27 DIAGNOSIS — R54 Age-related physical debility: Secondary | ICD-10-CM | POA: Diagnosis not present

## 2020-02-27 DIAGNOSIS — W19XXXA Unspecified fall, initial encounter: Secondary | ICD-10-CM | POA: Diagnosis not present

## 2020-02-28 ENCOUNTER — Telehealth: Payer: Self-pay | Admitting: *Deleted

## 2020-02-28 DIAGNOSIS — J449 Chronic obstructive pulmonary disease, unspecified: Secondary | ICD-10-CM | POA: Diagnosis not present

## 2020-02-28 DIAGNOSIS — W19XXXA Unspecified fall, initial encounter: Secondary | ICD-10-CM | POA: Diagnosis not present

## 2020-02-28 DIAGNOSIS — M545 Low back pain: Secondary | ICD-10-CM | POA: Diagnosis not present

## 2020-02-28 DIAGNOSIS — R55 Syncope and collapse: Secondary | ICD-10-CM | POA: Diagnosis not present

## 2020-02-28 DIAGNOSIS — R54 Age-related physical debility: Secondary | ICD-10-CM | POA: Diagnosis not present

## 2020-02-28 NOTE — Telephone Encounter (Signed)
xxxx 

## 2020-03-13 ENCOUNTER — Telehealth: Payer: Self-pay | Admitting: Family

## 2020-03-13 NOTE — Telephone Encounter (Signed)
Spoke with patient today and Heather [name] from [facility].   Patient stated she feel a burning sensation all over her body from her head to her toes. Very fatigued and no energy to get up or do anything. Patient advised to go to urgent care or ED for evaluation if she felt that bad. Offered to call EMS for wellness check but patient declined. Patient voiced she was unable to get up and no one there to help her. I asked if she had spoken with her son. States she called him no answer. I offered to call son for her to let him know what was going on. Spoke with son who states he saw his mother yesterday and she was fine but said she felt flushed. He said when she came home from the hospital they changed some of her meds and wanted an appointment with Heather [name] on tomorrow to discuss. Said he felt like his mom didn't need to go to the ER and was didn't want to take her for her fear of them keeping her. I made appointment for tomorrow and all parties agreed with plan.

## 2020-03-13 NOTE — Telephone Encounter (Signed)
New message:   Ailene Ravel with [facility] is calling in reference to the patient and states they have been unable to do visits with this patient. She states when she called the patient the patient said she was burning up but didn't know if she had a fever.She states the pt doesn't want to go to the hospital or anywhere to be seen. She has tried to reach out to the son but has been unable to reach him. Please advise.

## 2020-03-14 ENCOUNTER — Encounter: Payer: Self-pay | Admitting: Family

## 2020-03-14 ENCOUNTER — Ambulatory Visit (INDEPENDENT_AMBULATORY_CARE_PROVIDER_SITE_OTHER): Payer: Medicare Other

## 2020-03-14 ENCOUNTER — Other Ambulatory Visit: Payer: Self-pay

## 2020-03-14 ENCOUNTER — Other Ambulatory Visit: Payer: Self-pay | Admitting: Family

## 2020-03-14 ENCOUNTER — Ambulatory Visit (INDEPENDENT_AMBULATORY_CARE_PROVIDER_SITE_OTHER): Payer: Medicare Other | Admitting: Family

## 2020-03-14 VITALS — BP 112/60 | HR 80 | Temp 98.1°F | Ht 59.0 in

## 2020-03-14 DIAGNOSIS — I10 Essential (primary) hypertension: Secondary | ICD-10-CM

## 2020-03-14 DIAGNOSIS — R202 Paresthesia of skin: Secondary | ICD-10-CM | POA: Diagnosis not present

## 2020-03-14 DIAGNOSIS — R0789 Other chest pain: Secondary | ICD-10-CM

## 2020-03-14 DIAGNOSIS — Z87440 Personal history of urinary (tract) infections: Secondary | ICD-10-CM

## 2020-03-14 DIAGNOSIS — R079 Chest pain, unspecified: Secondary | ICD-10-CM | POA: Diagnosis not present

## 2020-03-14 DIAGNOSIS — E039 Hypothyroidism, unspecified: Secondary | ICD-10-CM

## 2020-03-14 DIAGNOSIS — R200 Anesthesia of skin: Secondary | ICD-10-CM | POA: Diagnosis not present

## 2020-03-14 LAB — CBC WITH DIFFERENTIAL/PLATELET
Basophils Absolute: 0.1 10*3/uL (ref 0.0–0.1)
Basophils Relative: 1.2 % (ref 0.0–3.0)
Eosinophils Absolute: 0 10*3/uL (ref 0.0–0.7)
Eosinophils Relative: 0.1 % (ref 0.0–5.0)
HCT: 36 % (ref 36.0–46.0)
Hemoglobin: 11.5 g/dL — ABNORMAL LOW (ref 12.0–15.0)
Lymphocytes Relative: 17.2 % (ref 12.0–46.0)
Lymphs Abs: 1.6 10*3/uL (ref 0.7–4.0)
MCHC: 32 g/dL (ref 30.0–36.0)
MCV: 83.5 fl (ref 78.0–100.0)
Monocytes Absolute: 0.7 10*3/uL (ref 0.1–1.0)
Monocytes Relative: 6.8 % (ref 3.0–12.0)
Neutro Abs: 7.1 10*3/uL (ref 1.4–7.7)
Neutrophils Relative %: 74.7 % (ref 43.0–77.0)
Platelets: 410 10*3/uL — ABNORMAL HIGH (ref 150.0–400.0)
RBC: 4.31 Mil/uL (ref 3.87–5.11)
RDW: 20.9 % — ABNORMAL HIGH (ref 11.5–15.5)
WBC: 9.5 10*3/uL (ref 4.0–10.5)

## 2020-03-14 LAB — COMPREHENSIVE METABOLIC PANEL
ALT: 8 U/L (ref 0–35)
AST: 12 U/L (ref 0–37)
Albumin: 4.2 g/dL (ref 3.5–5.2)
Alkaline Phosphatase: 74 U/L (ref 39–117)
BUN: 13 mg/dL (ref 6–23)
CO2: 29 mEq/L (ref 19–32)
Calcium: 10.1 mg/dL (ref 8.4–10.5)
Chloride: 102 mEq/L (ref 96–112)
Creatinine, Ser: 0.72 mg/dL (ref 0.40–1.20)
GFR: 77.41 mL/min (ref >60.00)
Glucose, Bld: 109 mg/dL — ABNORMAL HIGH (ref 70–99)
Potassium: 4 mEq/L (ref 3.5–5.1)
Sodium: 137 mEq/L (ref 135–145)
Total Bilirubin: 0.4 mg/dL (ref 0.2–1.2)
Total Protein: 6.9 g/dL (ref 6.0–8.3)

## 2020-03-14 LAB — MAGNESIUM: Magnesium: 2 mg/dL (ref 1.5–2.5)

## 2020-03-14 LAB — VITAMIN B12: Vitamin B-12: 163 pg/mL — ABNORMAL LOW (ref 211–911)

## 2020-03-14 LAB — TSH: TSH: 2.04 u[IU]/mL (ref 0.35–4.50)

## 2020-03-14 MED ORDER — AZITHROMYCIN 250 MG PO TABS: ORAL_TABLET | ORAL | 0 refills | Status: DC

## 2020-03-14 MED ORDER — DOXEPIN HCL 10 MG PO CAPS: 10.0000 mg | ORAL_CAPSULE | Freq: Every evening | ORAL | 0 refills | Status: DC | PRN

## 2020-03-14 NOTE — Progress Notes (Signed)
JUPITER BOYS is a 85 y.o. female with the following history as recorded in EpicCare:  Patient Active Problem List   Diagnosis Date Noted  . Syncope and collapse 02/03/2020  . Fall 04/22/2019  . Elevated CK 04/22/2019  . Leukocytosis 04/22/2019  . Malignant neoplasm of lower lobe of right lung ([place]) 11/16/2018  . Dehydration   . Abdominal pain 02/27/2018  . Colitis 02/27/2018  . SBO (small bowel obstruction) ([place]) 02/27/2018  . Hypokalemia 02/19/2018  . Oral thrush 01/29/2018  . Malnutrition of moderate degree 01/29/2018  . COPD with acute exacerbation ([place]) 01/28/2018  . COPD with emphysema ([place]) 06/26/2017  . COPD exacerbation ([place]) 05/29/2016  . Hypoxia 05/29/2016  . Coronary atherosclerosis 07/24/2015  . Atherosclerosis of aorta ([place]) 07/24/2015  . Dysphagia 07/24/2015  . Acute respiratory failure with hypoxia ([place]) 07/23/2015  . Bronchospasm with bronchitis, acute 07/23/2015  . HIP PAIN, LEFT 05/16/2010  . LOW BACK PAIN, CHRONIC 10/01/2009  . PARESTHESIA 10/01/2009  . Hypothyroidism 07/23/2009  . Dyslipidemia 07/23/2009  . Anxiety 07/23/2009  . Anxiety and depression 07/23/2009  . Essential hypertension 07/23/2009  . GERD 07/23/2009  . DIVERTICULOSIS, COLON 07/23/2009  . HEADACHE 07/23/2009    Current Outpatient Medications  Medication Sig Dispense Refill  . Albuterol Sulfate (PROAIR RESPICLICK) 941 (90 Base) MCG/ACT AEPB Inhale 2 puffs into the lungs 4 (four) times daily as needed. (Patient taking differently: Inhale 2 puffs into the lungs 4 (four) times daily as needed (sob and wheezing). ) 1 each 6  . amLODipine (NORVASC) 10 MG tablet Take 1 tablet (10 mg total) by mouth daily.    . benzonatate (TESSALON) 100 MG capsule TAKE 1 CAPSULE TWICE DAILY AS NEEDED FOR COUGH. (Patient taking differently: Take 100 mg by mouth 2 (two) times daily as needed for cough. ) 60 capsule 0  . BREO ELLIPTA 200-25 MCG/INH AEPB INHALE 1 PUFF INTO THE LUNGS ONCE DAILY. (Patient  taking differently: Inhale 1 puff into the lungs daily. ) 60 each 3  . Cholecalciferol (VITAMIN D3 PO) Take 1 tablet by mouth daily.    [name] esomeprazole (NEXIUM) 40 MG capsule TAKE 1 CAPSULE DAILY BEFORE BREAKFAST. (Patient taking differently: Take 40 mg by mouth daily. ) 90 capsule 0  . folic acid (FOLVITE) 1 MG tablet TAKE 1 TABLET ONCE DAILY. (Patient taking differently: Take 1 mg by mouth daily. ) 90 tablet 0  . furosemide (LASIX) 20 MG tablet TAKE 1 TABLET DAILY IF NEEDED FOR SIGNIFICANT LOWER EXTREMITY SWELLING. (Patient taking differently: Take 20 mg by mouth daily as needed for fluid or edema. ) 90 tablet 0  . levothyroxine (SYNTHROID) 25 MCG tablet TAKE 1 TABLET DAILY BEFORE BREAKFAST. (Patient taking differently: Take 25 mcg by mouth daily before breakfast. ) 90 tablet 3  . losartan (COZAAR) 25 MG tablet Take 25 mg by mouth daily.    . Melatonin 3 MG TABS Take 2 tablets (6 mg total) by mouth at bedtime as needed (sleep).  0  . Melatonin 3-10 MG TABS Take 2 tablets by mouth at bedtime as needed.    [name] MYRBETRIQ 25 MG TB24 tablet TAKE 1 TABLET ONCE DAILY. (Patient taking differently: Take 25 mg by mouth daily. ) 30 tablet 3  . oxybutynin (DITROPAN-XL) 5 MG 24 hr tablet Take 5 mg by mouth daily.    . potassium chloride SA (K-DUR) 20 MEQ tablet Take 1 tablet (20 mEq total) by mouth daily. 5 tablet 0  . pregabalin (LYRICA) 25 MG capsule Take 1  capsule (25 mg total) by mouth 2 (two) times daily. 60 capsule 6  . promethazine (PHENERGAN) 25 MG tablet TAKE 1 TABLET EVERY 8 HOURS AS NEEDED FOR NAUSEA & VOMITING. (Patient taking differently: Take 25 mg by mouth every 8 (eight) hours as needed for nausea or vomiting. ) 30 tablet 0  . senna-docusate (SENOKOT-S) 8.6-50 MG tablet Take 2 tablets by mouth at bedtime. (Patient taking differently: Take 2 tablets by mouth at bedtime as needed (constipation). ) 60 tablet 2  . sertraline (ZOLOFT) 50 MG tablet TAKE 1 TABLET EACH DAY. (Patient taking differently: Take  50 mg by mouth daily. ) 90 tablet 0  . traMADol (ULTRAM) 50 MG tablet Take 1 tablet (50 mg total) by mouth every 6 (six) hours as needed for moderate pain. Take 1tab every 4-6 hours prn pain 15 tablet 0  . VITAMIN D-1000 MAX ST 25 MCG (1000 UT) tablet Take 1,000 Units by mouth daily.    [name] azithromycin (ZITHROMAX) 250 MG tablet 2 tabs po qd x 1 day; 1 tablet per day x 4 days; 6 tablet 0  . doxepin (SINEQUAN) 10 MG capsule Take 1 capsule (10 mg total) by mouth at bedtime as needed. 30 capsule 0   No current facility-administered medications for this visit.    Allergies: Tylenol [acetaminophen] and Symbicort [budesonide-formoterol fumarate]  Past Medical History:  Diagnosis Date  . ANXIETY 07/23/2009  . Atherosclerosis of aorta ([place]) 07/24/2015  . Complication of anesthesia 7 or 8 yrs ago   woke up during colonscopy  . COPD (chronic obstructive pulmonary disease) ([place])   . Coronary atherosclerosis 07/24/2015  . DEPRESSION 07/23/2009  . DIVERTICULOSIS, COLON 07/23/2009  . GERD 07/23/2009  . Headache(784.0) 07/23/2009   occasional  . Hemorrhoids   . HIP PAIN, LEFT 05/16/2010  . History of Crohn's disease   . HYPERLIPIDEMIA 07/23/2009  . HYPERTENSION 07/23/2009  . HYPOTHYROIDISM 07/23/2009  . IBS (irritable bowel syndrome)   . Insomnia   . LOW BACK PAIN, CHRONIC 10/01/2009  . PARESTHESIA 10/01/2009  . Shingles 2006   back    Past Surgical History:  Procedure Laterality Date  . ABDOMINAL HYSTERECTOMY  age 64 or 57  . APPENDECTOMY  yrs ago  . BILATERAL SALPINGOOPHORECTOMY  age 48 or 24  . COLONOSCOPY N/A 11/17/2013   Procedure: COLONOSCOPY;  Surgeon: Cleotis Nipper, MD;  Location: [facility] ENDOSCOPY;  Service: Endoscopy;  Laterality: N/A;  . ESOPHAGOGASTRODUODENOSCOPY N/A 11/17/2013   Procedure: ESOPHAGOGASTRODUODENOSCOPY (EGD);  Surgeon: Cleotis Nipper, MD;  Location: [facility];  Service: Endoscopy;  Laterality: N/A;  . HEMORRHOID SURGERY  yrs ago  . TONSILLECTOMY  yrs ago    Family  History  Problem Relation Age of Onset  . Cancer Mother        Unknown type  . Cancer Sister        Breast  . Clotting disorder Neg Hx     Social History   Tobacco Use  . Smoking status: Former Smoker    Packs/day: 0.50    Types: Cigarettes    Start date: 07/16/1960    Quit date: 07/16/1998    Years since quitting: 21.6  . Smokeless tobacco: Never Used  Substance Use Topics  . Alcohol use: No    Alcohol/week: 0.0 standard drinks    Subjective:  Presents with her son; difficult historian; vague sensation of "burning up all over" but not running any type of fever; increased fatigue recently; son is concerned that having a medication reaction but  medications have been stable for at least the past 6 months; Patient has just been discharged from rehab facility- had a fall at the end of February and was sent to rehab from hospital; Per patient, she is eating regular meals; is very happy to be at home; feels safe at home; is not sleeping well- Trazodone and Ambien were stopped with recent hospital stay; no benefit with Melatonin; is desperate for a sleeping aid;    Objective:  Vitals:   03/14/20 1159  BP: 112/60  Pulse: 80  Temp: 98.1 F (36.7 C)  TempSrc: Oral  SpO2: 98%  Height: _0  (1.499 m)    General: Well developed, well nourished, in no acute distress; frail appearing Skin : Warm and dry.  Head: Normocephalic and atraumatic  Eyes: Sclera and conjunctiva clear; pupils round and reactive to light; extraocular movements intact  Ears: External normal; canals clear; tympanic membranes normal  Oropharynx: Pink, supple. No suspicious lesions  Neck: Supple without thyromegaly, adenopathy  Lungs: Respirations unlabored; clear to auscultation bilaterally without wheeze, rales, rhonchi  CVS exam: normal rate and regular rhythm.  Extremities: No edema, cyanosis, clubbing  Vessels: Symmetric bilaterally  Neurologic: Alert and oriented; speech intact; face symmetrical; moves all  extremities well; CNII-XII intact without focal deficit   Assessment:  1. Essential hypertension   2. Hypothyroidism, unspecified type   3. History of UTI   4. Numbness and tingling   5. Atypical chest pain     Plan:  ? Source of symptoms; will update labs and CXR today; patient was unable to provider urine sample Reviewed Chest CT done at hospital- did show progression of known lung cancer; I have reached out to her pulmonologist for clarification about next steps to take;  Trial of Doxepin 10 mg for insomnia- risks and benefits reviewed with patient and son and they both are comfortable with trial of medication; Follow-up to be determined;  This visit occurred during the SARS-CoV-2 public health emergency.  Safety protocols were in place, including screening questions prior to the visit, additional usage of staff PPE, and extensive cleaning of exam room while observing appropriate contact time as indicated for disinfecting solutions.     No follow-ups on file.  Orders Placed This Encounter  Procedures  . Urine Culture  . DG Chest 2 View    Standing Status:   Future    Number of Occurrences:   1    Standing Expiration Date:   05/14/2021    Order Specific Question:   Reason for Exam (SYMPTOM  OR DIAGNOSIS REQUIRED)    Answer:   atypical chest pain/ known lung cancer    Order Specific Question:   Preferred imaging location?    Answer:   [facility]    Order Specific Question:   Radiology Contrast Protocol - do NOT remove file path    Answer:   [email or web address]  . CBC with Differential/Platelet  . Comp Met (CMET)  . TSH  . Magnesium  . B12    Requested Prescriptions   Signed Prescriptions Disp Refills  . doxepin (SINEQUAN) 10 MG capsule 30 capsule 0    Sig: Take 1 capsule (10 mg total) by mouth at bedtime as needed.

## 2020-03-16 ENCOUNTER — Telehealth: Payer: Self-pay | Admitting: Family

## 2020-03-16 ENCOUNTER — Ambulatory Visit: Payer: Medicare Other | Admitting: Family

## 2020-03-16 NOTE — Telephone Encounter (Signed)
-----   Message from Laurin Coder, MD sent at 03/14/2020  2:18 PM EDT ----- I did look at his CTIt does look like postradiation changes  Unfortunately, it will continue to evolve. I am not certain it is causing severe pain, but it is possible.  Evaluation of it will be invasive and I do not believe she is healthy enough to tolerate an invasive intervention.  Pain management with follow-up CT in a short time about 3 months(from last CT) will be appropriate  Wale olalere ----- Message ----- From: Marrian Salvage, FNP Sent: 03/14/2020  12:20 PM EDT To: Laurin Coder, MD  Dr. Ander Slade,  Would you mind looking at Mrs. Hanratty's Chest CT that was done at her last hospitalization in February? It looks like the area of concern in her lungs is getting worse. She is here today saying "she is burning up on the inside."  Please advise-   Thank you, [name], FNP

## 2020-03-19 ENCOUNTER — Ambulatory Visit: Payer: Medicare Other | Admitting: Family

## 2020-03-21 ENCOUNTER — Other Ambulatory Visit: Payer: Self-pay | Admitting: Family

## 2020-03-21 ENCOUNTER — Ambulatory Visit (INDEPENDENT_AMBULATORY_CARE_PROVIDER_SITE_OTHER): Payer: Medicare Other

## 2020-03-21 ENCOUNTER — Other Ambulatory Visit: Payer: Self-pay

## 2020-03-21 DIAGNOSIS — E538 Deficiency of other specified B group vitamins: Secondary | ICD-10-CM | POA: Diagnosis not present

## 2020-03-21 MED ORDER — CYANOCOBALAMIN 1000 MCG/ML IJ SOLN
1000.0000 ug | Freq: Once | INTRAMUSCULAR | Status: AC
  Administered 2020-03-21: 1000 ug via INTRAMUSCULAR

## 2020-03-21 NOTE — Progress Notes (Signed)
cyan 

## 2020-03-29 ENCOUNTER — Ambulatory Visit (INDEPENDENT_AMBULATORY_CARE_PROVIDER_SITE_OTHER): Payer: Medicare Other

## 2020-03-29 ENCOUNTER — Other Ambulatory Visit: Payer: Self-pay

## 2020-03-29 DIAGNOSIS — E538 Deficiency of other specified B group vitamins: Secondary | ICD-10-CM

## 2020-03-29 MED ORDER — CYANOCOBALAMIN 1000 MCG/ML IJ SOLN
1000.0000 ug | Freq: Once | INTRAMUSCULAR | Status: AC
  Administered 2020-03-29: 1000 ug via INTRAMUSCULAR

## 2020-04-02 ENCOUNTER — Other Ambulatory Visit: Payer: Self-pay | Admitting: Family

## 2020-04-03 NOTE — Progress Notes (Signed)
Medical screening examination/treatment/procedure(s) were performed by non-physician practitioner and as supervising physician I was immediately available for consultation/collaboration. I agree with above. Kamarie Palma, MD   

## 2020-04-05 ENCOUNTER — Ambulatory Visit (INDEPENDENT_AMBULATORY_CARE_PROVIDER_SITE_OTHER): Payer: Medicare Other | Admitting: *Deleted

## 2020-04-05 ENCOUNTER — Other Ambulatory Visit: Payer: Self-pay

## 2020-04-05 DIAGNOSIS — E538 Deficiency of other specified B group vitamins: Secondary | ICD-10-CM | POA: Diagnosis not present

## 2020-04-05 MED ORDER — CYANOCOBALAMIN 1000 MCG/ML IJ SOLN
1000.0000 ug | Freq: Once | INTRAMUSCULAR | Status: AC
  Administered 2020-04-05: 1000 ug via INTRAMUSCULAR

## 2020-04-05 NOTE — Progress Notes (Signed)
Medical treatment/procedure(s) were performed by non-physician practitioner and as supervising physician I was immediately available for consultation/collaboration. I agree with above. Angelica Frandsen A Cem Kosman, MD  

## 2020-04-05 NOTE — Progress Notes (Signed)
Pls cosign for B12 inj since PCP is out of the office today.Marland KitchenJohny Chess

## 2020-04-12 ENCOUNTER — Other Ambulatory Visit: Payer: Self-pay

## 2020-04-12 ENCOUNTER — Ambulatory Visit (INDEPENDENT_AMBULATORY_CARE_PROVIDER_SITE_OTHER): Payer: Medicare Other | Admitting: *Deleted

## 2020-04-12 DIAGNOSIS — E538 Deficiency of other specified B group vitamins: Secondary | ICD-10-CM

## 2020-04-12 MED ORDER — CYANOCOBALAMIN 1000 MCG/ML IJ SOLN
1000.0000 ug | Freq: Once | INTRAMUSCULAR | Status: AC
  Administered 2020-04-12: 1000 ug via INTRAMUSCULAR

## 2020-04-12 NOTE — Progress Notes (Signed)
Patient ID: Heather [name], female   DOB: [birth year], 85 y.o.   MRN: [id number] Medical treatment/procedure(s) were performed by non-physician practitioner and as supervising physician I was immediately available for consultation/collaboration. I agree with above. Hoyt Koch, MD

## 2020-04-12 NOTE — Progress Notes (Signed)
Pls cosign for B12 inj since PCP is not in the office today.Marland KitchenJohny [name]

## 2020-04-16 ENCOUNTER — Other Ambulatory Visit: Payer: Self-pay | Admitting: Family

## 2020-04-19 ENCOUNTER — Ambulatory Visit (HOSPITAL_COMMUNITY)
Admission: RE | Admit: 2020-04-19 | Discharge: 2020-04-19 | Disposition: A | Discharge status: Home / Self Care | Payer: Medicare Other | Source: Ambulatory Visit | Attending: Radiation Oncology | Admitting: Radiation Oncology

## 2020-04-19 ENCOUNTER — Other Ambulatory Visit: Payer: Self-pay

## 2020-04-19 ENCOUNTER — Encounter (HOSPITAL_COMMUNITY): Payer: Self-pay

## 2020-04-19 DIAGNOSIS — C3431 Malignant neoplasm of lower lobe, right bronchus or lung: Secondary | ICD-10-CM | POA: Diagnosis not present

## 2020-04-19 MED ORDER — SODIUM CHLORIDE (PF) 0.9 % IJ SOLN
INTRAMUSCULAR | Status: AC
  Filled 2020-04-19: qty 50

## 2020-04-19 MED ORDER — IOHEXOL 300 MG/ML  SOLN
75.0000 mL | Freq: Once | INTRAMUSCULAR | Status: AC | PRN
  Administered 2020-04-19: 75 mL via INTRAVENOUS

## 2020-04-23 ENCOUNTER — Ambulatory Visit
Admission: RE | Admit: 2020-04-23 | Discharge: 2020-04-23 | Disposition: A | Discharge status: Home / Self Care | Payer: Medicare Other | Source: Ambulatory Visit | Attending: Radiation Oncology | Admitting: Radiation Oncology

## 2020-04-23 ENCOUNTER — Encounter: Payer: Self-pay | Admitting: Radiation Oncology

## 2020-04-23 ENCOUNTER — Other Ambulatory Visit: Payer: Self-pay

## 2020-04-23 DIAGNOSIS — C3431 Malignant neoplasm of lower lobe, right bronchus or lung: Secondary | ICD-10-CM

## 2020-04-23 DIAGNOSIS — Z8709 Personal history of other diseases of the respiratory system: Secondary | ICD-10-CM | POA: Diagnosis not present

## 2020-04-23 DIAGNOSIS — Z08 Encounter for follow-up examination after completed treatment for malignant neoplasm: Secondary | ICD-10-CM | POA: Diagnosis not present

## 2020-04-23 DIAGNOSIS — J449 Chronic obstructive pulmonary disease, unspecified: Secondary | ICD-10-CM | POA: Diagnosis not present

## 2020-04-23 DIAGNOSIS — S32020A Wedge compression fracture of second lumbar vertebra, initial encounter for closed fracture: Secondary | ICD-10-CM | POA: Diagnosis not present

## 2020-04-23 DIAGNOSIS — Z87891 Personal history of nicotine dependence: Secondary | ICD-10-CM | POA: Diagnosis not present

## 2020-04-23 NOTE — Progress Notes (Signed)
Radiation Oncology         [phone] ________________________________  Outpatient Follow Up - Conducted via telephone due to current COVID-19 concerns for limiting patient exposure  I spoke with the patient to conduct this consult visit via telephone to spare the patient unnecessary potential exposure in the healthcare setting during the current COVID-19 pandemic. The patient was notified in advance and was offered a [place] meeting to allow for face to face communication but unfortunately reported that they did not have the appropriate resources/technology to support such a visit and instead preferred to proceed with a telephone visit.   Name: Heather [name]        MRN: [id number]  Date of Service: 04/23/2020 DOB: [birth year]  FI:EPPIRJ, Marvis Repress, FNP  Marrian Salvage,*     REFERRING PHYSICIAN: Marrian Salvage,*   DIAGNOSIS: The encounter diagnosis was Malignant neoplasm of lower lobe of right lung ([place]).   HISTORY OF PRESENT ILLNESS: Heather [name] is a 85 y.o. female with a history of putative Stage IA2, cT1bN0M0, NSCLC of the right lower lobe. The paitent has a history of COPD controlled on 3 inhalers and who had a CT in February 2019 that revealed a 1 cm mass in the right lower lobe.  This has been followed on subsequent scans as it was originally felt to be mucus plugging, however on 06/11/2018 this was seen again measuring 1.1 x 1.1 cm.  No evidence of adenopathy was identified.  She was counseled on the rationale to proceed with pet imaging which she underwent on 06/28/2018.  The SUV of the lesion was 2.77, with a blood pool of 2.23.  The lesion did measure 12 mm.  No additional evidence of adenopathy was identified.  She was counseled on repeat imaging and underwent this on 10/18/2018.  The lesion now measured 15 x 13 mm in comparison to her prior imaging.  No adenopathy is identified.  She is against moving forward with aggressive work-up, and would not be a  good surgical candidate.  She proceeded with stereotactic body radiotherapy (SBRT) which she completed in January 2020. She has been NED since though she also has compression deformity seen at L2, as well as atherosclerotic disease in the aorta and emphysema. Her most recent scan on 04/19/20 revealed improvement in the post radiation fibrotic changes, and mild change in measurement of a right infrahilar node measuring 1 mm, and 8 mm from her prior noncontrasted study in February 2021. No new nodules were otherwise noted.   On review of systems, the patient reports that she is doing well overall. She denies any chest pain, shortness of breath, cough, fevers, chills, night sweats, unintended weight changes. She denies any bowel or bladder disturbances, and denies abdominal pain, nausea or vomiting. She denies any new musculoskeletal or joint aches or pains, new skin lesions or concerns. A complete review of systems is obtained and is otherwise negative.    PREVIOUS RADIATION THERAPY:   12/15/18-12/22/2018 SBRT Treatment: The RLL Target was treated to 54 Gy in 3 fractions  PAST MEDICAL HISTORY:  Past Medical History:  Diagnosis Date  . ANXIETY 07/23/2009  . Atherosclerosis of aorta ([place]) 07/24/2015  . Complication of anesthesia 7 or 8 yrs ago   woke up during colonscopy  . COPD (chronic obstructive pulmonary disease) ([place])   . Coronary atherosclerosis 07/24/2015  . DEPRESSION 07/23/2009  . DIVERTICULOSIS, COLON 07/23/2009  . GERD 07/23/2009  . Headache(784.0) 07/23/2009   occasional  . Hemorrhoids   .  HIP PAIN, LEFT 05/16/2010  . History of Crohn's disease   . HYPERLIPIDEMIA 07/23/2009  . HYPERTENSION 07/23/2009  . HYPOTHYROIDISM 07/23/2009  . IBS (irritable bowel syndrome)   . Insomnia   . LOW BACK PAIN, CHRONIC 10/01/2009  . PARESTHESIA 10/01/2009  . Shingles 2006   back       PAST SURGICAL HISTORY: Past Surgical History:  Procedure Laterality Date  . ABDOMINAL HYSTERECTOMY  age 87 or 18  .  APPENDECTOMY  yrs ago  . BILATERAL SALPINGOOPHORECTOMY  age 68 or 33  . COLONOSCOPY N/A 11/17/2013   Procedure: COLONOSCOPY;  Surgeon: Cleotis Nipper, MD;  Location: [facility];  Service: Endoscopy;  Laterality: N/A;  . ESOPHAGOGASTRODUODENOSCOPY N/A 11/17/2013   Procedure: ESOPHAGOGASTRODUODENOSCOPY (EGD);  Surgeon: Cleotis Nipper, MD;  Location: [facility];  Service: Endoscopy;  Laterality: N/A;  . HEMORRHOID SURGERY  yrs ago  . TONSILLECTOMY  yrs ago     FAMILY HISTORY:  Family History  Problem Relation Age of Onset  . Cancer Mother        Unknown type  . Cancer Sister        Breast  . Clotting disorder Neg Hx      SOCIAL HISTORY:  reports that she quit smoking about 21 years ago. Her smoking use included cigarettes. She started smoking about 59 years ago. She smoked 0.50 packs per day. She has never used smokeless tobacco. She reports that she does not drink alcohol or use drugs. The patient is widowed and lives in [place]. Her son Remo Lipps is an active participant in her medical care.   ALLERGIES: Tylenol [acetaminophen] and Symbicort [budesonide-formoterol fumarate]   MEDICATIONS:  Current Outpatient Medications  Medication Sig Dispense Refill  . Albuterol Sulfate (PROAIR RESPICLICK) 518 (90 Base) MCG/ACT AEPB Inhale 2 puffs into the lungs 4 (four) times daily as needed. (Patient taking differently: Inhale 2 puffs into the lungs 4 (four) times daily as needed (sob and wheezing). ) 1 each 6  . amLODipine (NORVASC) 10 MG tablet TAKE (1) TABLET DAILY FOR HIGH BLOOD PRESSURE. 90 tablet 0  . azithromycin (ZITHROMAX) 250 MG tablet 2 tabs po qd x 1 day; 1 tablet per day x 4 days; 6 tablet 0  . benzonatate (TESSALON) 100 MG capsule TAKE 1 CAPSULE TWICE DAILY AS NEEDED FOR COUGH. (Patient taking differently: Take 100 mg by mouth 2 (two) times daily as needed for cough. ) 60 capsule 0  . BREO ELLIPTA 200-25 MCG/INH AEPB INHALE 1 PUFF INTO THE LUNGS ONCE DAILY. 180 each 0  .  Cholecalciferol (VITAMIN D3 PO) Take 1 tablet by mouth daily.    [name] doxepin (SINEQUAN) 10 MG capsule TAKE 1 TABLET AT BEDTIME AS NEEDED. 90 capsule 0  . esomeprazole (NEXIUM) 40 MG capsule Take 1 capsule (40 mg total) by mouth daily. 90 capsule 3  . folic acid (FOLVITE) 1 MG tablet TAKE 1 TABLET ONCE DAILY. 90 tablet 3  . furosemide (LASIX) 20 MG tablet TAKE 1 TABLET DAILY IF NEEDED FOR SIGNIFICANT LOWER EXTREMITY SWELLING. (Patient taking differently: Take 20 mg by mouth daily as needed for fluid or edema. ) 90 tablet 0  . levothyroxine (SYNTHROID) 25 MCG tablet TAKE 1 TABLET DAILY BEFORE BREAKFAST. (Patient taking differently: Take 25 mcg by mouth daily before breakfast. ) 90 tablet 3  . losartan (COZAAR) 25 MG tablet TAKE (1) TABLET DAILY FOR HIGH BLOOD PRESSURE. 90 tablet 0  . Melatonin 3 MG TABS Take 2 tablets (6 mg total) by mouth  at bedtime as needed (sleep).  0  . Melatonin 3-10 MG TABS Take 2 tablets by mouth at bedtime as needed.    [name] MYRBETRIQ 25 MG TB24 tablet TAKE 1 TABLET ONCE DAILY. (Patient taking differently: Take 25 mg by mouth daily. ) 30 tablet 3  . oxybutynin (DITROPAN-XL) 5 MG 24 hr tablet TAKE 1 TABLET IN THE MORNING FOR BLADDER CONTROL. 90 tablet 0  . potassium chloride SA (KLOR-CON) 20 MEQ tablet TAKE 1 TABLET ONCE DAILY. 90 tablet 0  . pregabalin (LYRICA) 25 MG capsule Take 1 capsule (25 mg total) by mouth 2 (two) times daily. 60 capsule 6  . promethazine (PHENERGAN) 25 MG tablet TAKE 1 TABLET EVERY 8 HOURS AS NEEDED FOR NAUSEA & VOMITING. (Patient taking differently: Take 25 mg by mouth every 8 (eight) hours as needed for nausea or vomiting. ) 30 tablet 0  . senna-docusate (SENOKOT-S) 8.6-50 MG tablet Take 2 tablets by mouth at bedtime. (Patient taking differently: Take 2 tablets by mouth at bedtime as needed (constipation). ) 60 tablet 2  . sertraline (ZOLOFT) 50 MG tablet TAKE 1 TABLET EACH DAY. 90 tablet 3  . traMADol (ULTRAM) 50 MG tablet Take 1 tablet (50 mg total) by  mouth every 6 (six) hours as needed for moderate pain. Take 1tab every 4-6 hours prn pain 15 tablet 0  . VITAMIN D-1000 MAX ST 25 MCG (1000 UT) tablet Take 1,000 Units by mouth daily.     No current facility-administered medications for this encounter.      PHYSICAL EXAM:  Unable to assess due to encounter type.  ECOG = 0  0 - Asymptomatic (Fully active, able to carry on all predisease activities without restriction)  1 - Symptomatic but completely ambulatory (Restricted in physically strenuous activity but ambulatory and able to carry out work of a light or sedentary nature. For example, light housework, office work)  2 - Symptomatic, <50% in bed during the day (Ambulatory and capable of all self care but unable to carry out any work activities. Up and about more than 50% of waking hours)  3 - Symptomatic, >50% in bed, but not bedbound (Capable of only limited self-care, confined to bed or chair 50% or more of waking hours)  4 - Bedbound (Completely disabled. Cannot carry on any self-care. Totally confined to bed or chair)  5 - Death   Eustace Pen MM, Creech RH, Tormey DC, et al. [phone]). "Toxicity and response criteria of the [facility]". [place] Oncol. 5 (6): 649-55    LABORATORY DATA:  Lab Results  Component Value Date   WBC 9.5 03/14/2020   HGB 11.5 (L) 03/14/2020   HCT 36.0 03/14/2020   MCV 83.5 03/14/2020   PLT 410.0 (H) 03/14/2020   Lab Results  Component Value Date   NA 137 03/14/2020   K 4.0 03/14/2020   CL 102 03/14/2020   CO2 29 03/14/2020   Lab Results  Component Value Date   ALT 8 03/14/2020   AST 12 03/14/2020   ALKPHOS 74 03/14/2020   BILITOT 0.4 03/14/2020      RADIOGRAPHY: CT CHEST W CONTRAST  Result Date: 04/20/2020 CLINICAL DATA:  Putative stage IA right lower lobe lung cancer status post radiation therapy completed 12/22/2018. Restaging. EXAM: CT CHEST WITH CONTRAST TECHNIQUE: Multidetector CT imaging of the chest was  performed during intravenous contrast administration. CONTRAST:  9mL OMNIPAQUE IOHEXOL 300 MG/ML  SOLN COMPARISON:  10/14/2019 and 02/03/2020 chest CT. FINDINGS: Cardiovascular: Normal heart  size. No significant pericardial effusion/thickening. Left anterior descending coronary atherosclerosis. Atherosclerotic nonaneurysmal thoracic aorta. Normal caliber pulmonary arteries. No central pulmonary emboli. Mediastinum/Nodes: No discrete thyroid nodules. Unremarkable esophagus. No axillary or mediastinal adenopathy. Mildly enlarged 1.0 cm right infrahilar node (series 2/image 86), paring slightly increased from 0.8 cm on prior noncontrast chest CT. No additional pathologically enlarged hilar nodes. Lungs/Pleura: No pneumothorax. No pleural effusion. Severe centrilobular emphysema with mild diffuse bronchial wall thickening. Masslike fibrosis in the right lower lobe measures 4.6 x 4.4 cm (series 5/image 108), previously 6.2 x 4.2 cm, overall decreased in size with associated sharp margins and progressive volume loss and distortion, compatible with expected evolution of radiation change. No acute interval consolidative airspace disease. No new significant pulmonary nodules. Upper abdomen: No acute abnormality. Musculoskeletal: No aggressive appearing focal osseous lesions. Chronic moderate L2 vertebral compression fracture status post vertebroplasty. IMPRESSION: 1. Newly mildly enlarged right infrahilar lymph node, nonspecific, cannot exclude nodal metastasis. Suggest attention on follow-up chest CT with IV contrast in 3 months versus further evaluation with PET-CT at this time, as clinically warranted. 2. Otherwise expected evolution of right lower lobe masslike radiation fibrosis, which is decreased. No other potential findings of metastatic disease in the chest. 3. Aortic Atherosclerosis (ICD10-I70.0) and Emphysema (ICD10-J43.9). Electronically Signed   By: Ilona Sorrel M.D.   On: 04/20/2020 15:15        IMPRESSION/PLAN: 1. Putative Stage IA2, cT1bN0M0, NSCLC of the right lower lobe.The patient continues to be NED since treatment. Her postraditoherapy fibrosis continues to be improved and no new disease is noted. The exception was the finding in the right infrahilar nodal region though the comparison study lacked IV contrast. We will repeat in 3 months with contrast to ensure there is not a concern in this location. She is in agreement with this plan. 2. COPD. The patient reports she is still doing well with her inhaled medications. She will follow up with pulmonary medicine as well as PCP. 3. Atherosclerotic changes on CT. This is followed by her PCP. 4. L2 compression fracture. The patient is asymptomatic at this time. We will follow this expectantly. 5. HIPPA request. The patient has previously requested we only speak with her son [name] and he and I have communicated previously. She will let us know if he has questions or concerns regarding our discussion today.    Given current concerns for patient exposure during the COVID-19 pandemic, this encounter was conducted via telephone.  The patient has given verbal consent for this type of encounter. The time spent during this encounter was 25 minutes and 50% of that time was spent in the coordination of her care. The attendants for this meeting include Shona Simpson, [facility] and [name]  During the encounter, Shona Simpson [facility] was located at home remotely. HARMONY SANDELL  was located at home.     Carola Rhine, PAC

## 2020-05-09 ENCOUNTER — Other Ambulatory Visit: Payer: Self-pay | Admitting: Internal Medicine

## 2020-05-09 ENCOUNTER — Telehealth: Payer: Self-pay

## 2020-05-09 DIAGNOSIS — I739 Peripheral vascular disease, unspecified: Secondary | ICD-10-CM

## 2020-05-09 DIAGNOSIS — B0229 Other postherpetic nervous system involvement: Secondary | ICD-10-CM | POA: Insufficient documentation

## 2020-05-09 MED ORDER — GABAPENTIN 100 MG PO CAPS: 100.0000 mg | ORAL_CAPSULE | Freq: Three times a day (TID) | ORAL | 3 refills | Status: DC

## 2020-05-09 NOTE — Telephone Encounter (Signed)
Patient informed. 

## 2020-05-09 NOTE — Telephone Encounter (Signed)
Please make sure they are aware of these results from her [facility] visit. I will refer her to a vascular specialist for further evaluation.

## 2020-05-18 ENCOUNTER — Other Ambulatory Visit: Payer: Self-pay | Admitting: Family

## 2020-05-25 ENCOUNTER — Encounter: Payer: Medicare Other | Admitting: Physical Medicine and Rehabilitation

## 2020-06-01 ENCOUNTER — Telehealth: Payer: Self-pay | Admitting: Family

## 2020-06-01 NOTE — Telephone Encounter (Signed)
New message:   Pt is calling and states Dr. Valere Dross was going to send her to see a specialists and she hasn't received a call just yet. I have given the pt the name and number to the office the referral was sent. No action is needed but note was started before the patient went into full detail of what she needed. Please advise.

## 2020-06-05 ENCOUNTER — Other Ambulatory Visit: Payer: Self-pay | Admitting: Physical Medicine and Rehabilitation

## 2020-06-11 ENCOUNTER — Other Ambulatory Visit: Payer: Self-pay | Admitting: Family

## 2020-07-02 ENCOUNTER — Other Ambulatory Visit: Payer: Self-pay | Admitting: Physical Medicine and Rehabilitation

## 2020-07-03 ENCOUNTER — Telehealth: Payer: Self-pay

## 2020-07-03 NOTE — Telephone Encounter (Signed)
New message    1.Medication Requested:traMADol (ULTRAM) 50 MG tablet  2. Pharmacy (Name, [place], City):[facility], [place]  3. On Med List:  Yes   4. Last Visit with PCP: 4.7.21   5. Next visit date with PCP: N/A    Agent: Please be advised that RX refills may take up to 3 business days. We ask that you follow-up with your pharmacy.

## 2020-07-03 NOTE — Telephone Encounter (Signed)
Needs to get from her pain management specialist; I am no longer managing this one for her.

## 2020-07-03 NOTE — Telephone Encounter (Signed)
Check [place] registry last filled 06/07/2020.Marland KitchenJohny [name]

## 2020-07-03 NOTE — Telephone Encounter (Signed)
Tried to call pt no answer x's 10 ring.. no vm either. Will retry later.Marland KitchenJohny Chess

## 2020-07-04 ENCOUNTER — Other Ambulatory Visit: Payer: Self-pay | Admitting: Physical Medicine and Rehabilitation

## 2020-07-04 NOTE — Telephone Encounter (Signed)
Tried calling pt again still no answer x 10 rings...sent denial to pharmacy w/[name] response.Marland KitchenJohny Chess

## 2020-07-05 ENCOUNTER — Telehealth: Payer: Self-pay

## 2020-07-05 ENCOUNTER — Other Ambulatory Visit: Payer: Self-pay | Admitting: Physical Medicine and Rehabilitation

## 2020-07-05 ENCOUNTER — Other Ambulatory Visit: Payer: Self-pay | Admitting: Family

## 2020-07-05 ENCOUNTER — Telehealth: Payer: Self-pay | Admitting: *Deleted

## 2020-07-05 MED ORDER — TRAMADOL HCL 50 MG PO TABS: 50.0000 mg | ORAL_TABLET | Freq: Two times a day (BID) | ORAL | 0 refills | Status: DC | PRN

## 2020-07-05 NOTE — Telephone Encounter (Signed)
Pharmacy contacted clinic line stating that patient reached out to them regarding her tramadol.  I informed the pharmacist that her prescription refill was denied because the patient is due for 6 month tramadol follow up.  Patient cancelled January appointment.     I instant messaged Dr. Ranell Patrick and she says she will fill one time with the provision that patient is scheduled for appointment. No more refills unless patient is seen.  Please contact patient and schedule appointment with Dr. Ranell Patrick

## 2020-07-05 NOTE — Telephone Encounter (Signed)
Patient called for a refill of Tramadol. Refill was denied by Provider. Patient need to keep her follow up appointments. Patient advised to call her PCP or see an urgent care of pain is not controlled today.   I spoke to her son also he schelduled a follow up appointment for her.

## 2020-07-05 NOTE — Telephone Encounter (Signed)
   Scheduler advised patient to contact pain management for Tramadol refill Patient declined to schedule appointment with Valere Dross at this time, stating her son is out of town and he is her source of transportation

## 2020-07-08 ENCOUNTER — Other Ambulatory Visit: Payer: Self-pay | Admitting: Physical Medicine and Rehabilitation

## 2020-07-09 ENCOUNTER — Telehealth: Payer: Self-pay | Admitting: Family

## 2020-07-09 NOTE — Telephone Encounter (Signed)
Marrian Salvage, FNP   LM  11:34 AM Note Needs to get from her pain management specialist; I am no longer managing this one for her.

## 2020-07-09 NOTE — Telephone Encounter (Signed)
Spoke with patient. She states this has been taken care of.

## 2020-07-09 NOTE — Telephone Encounter (Signed)
Pt left me a vm regarding her pain medication and asked that you give her a call.  Can you please call her  Thanks Cecille Rubin

## 2020-07-11 ENCOUNTER — Encounter (HOSPITAL_COMMUNITY): Payer: Medicare Other

## 2020-07-11 ENCOUNTER — Other Ambulatory Visit: Payer: Self-pay | Admitting: *Deleted

## 2020-07-11 ENCOUNTER — Encounter: Payer: Medicare Other | Admitting: Vascular Surgery

## 2020-07-11 DIAGNOSIS — M25561 Pain in right knee: Secondary | ICD-10-CM

## 2020-07-25 ENCOUNTER — Encounter: Payer: Self-pay | Admitting: Vascular Surgery

## 2020-07-25 ENCOUNTER — Ambulatory Visit: Payer: Medicare Other | Admitting: Vascular Surgery

## 2020-07-25 ENCOUNTER — Other Ambulatory Visit: Payer: Self-pay

## 2020-07-25 ENCOUNTER — Ambulatory Visit (HOSPITAL_COMMUNITY)
Admission: RE | Admit: 2020-07-25 | Discharge: 2020-07-25 | Disposition: A | Discharge status: Home / Self Care | Payer: Medicare Other | Source: Ambulatory Visit | Attending: Vascular Surgery | Admitting: Vascular Surgery

## 2020-07-25 VITALS — BP 135/66 | HR 70 | Temp 98.3°F | Resp 18 | Ht 59.0 in | Wt 99.0 lb

## 2020-07-25 DIAGNOSIS — M25561 Pain in right knee: Secondary | ICD-10-CM | POA: Diagnosis not present

## 2020-07-25 DIAGNOSIS — I739 Peripheral vascular disease, unspecified: Secondary | ICD-10-CM | POA: Diagnosis not present

## 2020-07-25 DIAGNOSIS — M25562 Pain in left knee: Secondary | ICD-10-CM | POA: Insufficient documentation

## 2020-07-25 NOTE — Progress Notes (Signed)
REASON FOR CONSULT:    To evaluate for peripheral vascular disease.  The consult is requested by [name], NP  ASSESSMENT & PLAN:   FOOT PAIN: This patient describes burning pain and paresthesias in both feet.  I think her symptoms are most consistent with neuropathy.  Certainly given her history of back surgery she could have referred pain from degenerative disc disease of her back.  I do not get any clear-cut history of rest pain.  She has palpable posterior tibial pulses and normal noninvasive studies including triphasic Doppler signals in both feet, ABIs in the 100% bilaterally, and normal toe pressures bilaterally.  I reassured her that she has no evidence of significant arterial insufficiency.  On exam she has no evidence of significant venous insufficiency.  Again I think her symptoms are more likely neurogenic.  If her symptoms persist I think possible referral to a neurologist would be helpful.  I will be happy to see her back at any time if any new vascular issues arise  Deitra Mayo, MD Office: [phone]   HPI:   Heather [name] is a pleasant 85 y.o. female, who describes burning pain in both feet in addition to paresthesias which has been going on since January of this year.  The symptoms have gradually progressed.  She does describe some pain in her cast brought on by ambulation and relieved with rest.  I do not get any clear-cut history of rest pain or history of nonhealing ulcers.  She has had previous back surgery.  She also has pain in her left hip possibly related to arthritis.  She states that her feet are very sensitive at night and she does not tolerate having the weight of her blankets on her feet.  Her risk factors for peripheral vascular disease include hypertension and hypercholesterolemia.  She has a remote history of tobacco use and quit in 1999.  She denies any history of diabetes or family history of premature cardiovascular disease.  Past Medical  History:  Diagnosis Date  . ANXIETY 07/23/2009  . Atherosclerosis of aorta ([place]) 07/24/2015  . Complication of anesthesia 7 or 8 yrs ago   woke up during colonscopy  . COPD (chronic obstructive pulmonary disease) ([place])   . Coronary atherosclerosis 07/24/2015  . DEPRESSION 07/23/2009  . DIVERTICULOSIS, COLON 07/23/2009  . GERD 07/23/2009  . Headache(784.0) 07/23/2009   occasional  . Hemorrhoids   . HIP PAIN, LEFT 05/16/2010  . History of Crohn's disease   . HYPERLIPIDEMIA 07/23/2009  . HYPERTENSION 07/23/2009  . HYPOTHYROIDISM 07/23/2009  . IBS (irritable bowel syndrome)   . Insomnia   . LOW BACK PAIN, CHRONIC 10/01/2009  . PARESTHESIA 10/01/2009  . Shingles 2006   back    Family History  Problem Relation Age of Onset  . Cancer Mother        Unknown type  . Cancer Sister        Breast  . Clotting disorder Neg Hx     SOCIAL HISTORY: Social History   Socioeconomic History  . Marital status: Widowed    Spouse name: Not on file  . Number of children: 1  . Years of education: Not on file  . Highest education level: Not on file  Occupational History  . Not on file  Tobacco Use  . Smoking status: Former Smoker    Packs/day: 0.50    Types: Cigarettes    Start date: 07/16/1960    Quit date: 07/16/1998    Years since  quitting: 22.0  . Smokeless tobacco: Never Used  Vaping Use  . Vaping Use: Never used  Substance and Sexual Activity  . Alcohol use: No    Alcohol/week: 0.0 standard drinks  . Drug use: No  . Sexual activity: Never  Other Topics Concern  . Not on file  Social History Narrative   Widowed.  Lives alone in her own home.  Ambulates with a cane/walker when needed.   Social Determinants of Health   Financial Resource Strain:   . Difficulty of Paying Living Expenses:   Food Insecurity:   . Worried About Charity fundraiser in the Last Year:   . Arboriculturist in the Last Year:   Transportation Needs:   . Film/video editor (Medical):   [name] Lack of  Transportation (Non-Medical):   Physical Activity:   . Days of Exercise per Week:   . Minutes of Exercise per Session:   Stress:   . Feeling of Stress :   Social Connections:   . Frequency of Communication with Friends and Family:   . Frequency of Social Gatherings with Friends and Family:   . Attends Religious Services:   . Active Member of Clubs or Organizations:   . Attends Archivist Meetings:   [name] Marital Status:   Intimate Partner Violence:   . Fear of Current or Ex-Partner:   . Emotionally Abused:   [name] Physically Abused:   . Sexually Abused:     Allergies  Allergen Reactions  . Tylenol [Acetaminophen] Other (See Comments)    Nightmares  . Symbicort [Budesonide-Formoterol Fumarate] Other (See Comments)    Pt felt like her tongue was swollen, could not swallow    Current Outpatient Medications  Medication Sig Dispense Refill  . amLODipine (NORVASC) 10 MG tablet TAKE (1) TABLET DAILY FOR HIGH BLOOD PRESSURE. 90 tablet 0  . BREO ELLIPTA 200-25 MCG/INH AEPB INHALE 1 PUFF INTO THE LUNGS ONCE DAILY. 180 each 0  . doxepin (SINEQUAN) 10 MG capsule TAKE 1 TABLET AT BEDTIME AS NEEDED. 90 capsule 0  . losartan (COZAAR) 25 MG tablet TAKE (1) TABLET DAILY FOR HIGH BLOOD PRESSURE. 90 tablet 0  . oxybutynin (DITROPAN-XL) 5 MG 24 hr tablet TAKE 1 TABLET IN THE MORNING FOR BLADDER CONTROL. 90 tablet 0  . potassium chloride SA (KLOR-CON) 20 MEQ tablet TAKE 1 TABLET ONCE DAILY. 90 tablet 0  . pregabalin (LYRICA) 25 MG capsule TAKE (1) CAPSULE TWICE DAILY. 60 capsule 0  . Albuterol Sulfate (PROAIR RESPICLICK) 756 (90 Base) MCG/ACT AEPB Inhale 2 puffs into the lungs 4 (four) times daily as needed. (Patient taking differently: Inhale 2 puffs into the lungs 4 (four) times daily as needed (sob and wheezing). ) 1 each 6  . azithromycin (ZITHROMAX) 250 MG tablet 2 tabs po qd x 1 day; 1 tablet per day x 4 days; 6 tablet 0  . benzonatate (TESSALON) 100 MG capsule TAKE 1 CAPSULE TWICE DAILY  AS NEEDED FOR COUGH. (Patient taking differently: Take 100 mg by mouth 2 (two) times daily as needed for cough. ) 60 capsule 0  . Cholecalciferol (VITAMIN D3 PO) Take 1 tablet by mouth daily.    [name] esomeprazole (NEXIUM) 40 MG capsule Take 1 capsule (40 mg total) by mouth daily. 90 capsule 3  . folic acid (FOLVITE) 1 MG tablet TAKE 1 TABLET ONCE DAILY. 90 tablet 3  . furosemide (LASIX) 20 MG tablet TAKE 1 TABLET DAILY IF NEEDED FOR SIGNIFICANT LOWER EXTREMITY SWELLING. (Patient  taking differently: Take 20 mg by mouth daily as needed for fluid or edema. ) 90 tablet 0  . gabapentin (NEURONTIN) 100 MG capsule Take 1 capsule (100 mg total) by mouth 3 (three) times daily. 90 capsule 3  . levothyroxine (SYNTHROID) 25 MCG tablet TAKE 1 TABLET DAILY BEFORE BREAKFAST. 90 tablet 2  . Melatonin 3 MG TABS Take 2 tablets (6 mg total) by mouth at bedtime as needed (sleep).  0  . Melatonin 3-10 MG TABS Take 2 tablets by mouth at bedtime as needed.    [name] MYRBETRIQ 25 MG TB24 tablet TAKE 1 TABLET ONCE DAILY. (Patient taking differently: Take 25 mg by mouth daily. ) 30 tablet 3  . promethazine (PHENERGAN) 25 MG tablet TAKE 1 TABLET EVERY 8 HOURS AS NEEDED FOR NAUSEA & VOMITING. (Patient taking differently: Take 25 mg by mouth every 8 (eight) hours as needed for nausea or vomiting. ) 30 tablet 0  . senna-docusate (SENOKOT-S) 8.6-50 MG tablet Take 2 tablets by mouth at bedtime. (Patient taking differently: Take 2 tablets by mouth at bedtime as needed (constipation). ) 60 tablet 2  . sertraline (ZOLOFT) 50 MG tablet TAKE 1 TABLET EACH DAY. 90 tablet 3  . traMADol (ULTRAM) 50 MG tablet Take 1 tablet (50 mg total) by mouth 2 (two) times daily as needed. 60 tablet 0  . traZODone (DESYREL) 150 MG tablet Take 150 mg by mouth at bedtime.    [name] VITAMIN D-1000 MAX ST 25 MCG (1000 UT) tablet Take 1,000 Units by mouth daily.     No current facility-administered medications for this visit.    REVIEW OF SYSTEMS:  [X]  denotes  positive finding, [ ]  denotes negative finding Cardiac  Comments:  Chest pain or chest pressure:    Shortness of breath upon exertion:    Short of breath when lying flat:    Irregular heart rhythm:        Vascular    Pain in calf, thigh, or hip brought on by ambulation: x   Pain in feet at night that wakes you up from your sleep:     Blood clot in your veins:    Leg swelling:         Pulmonary    Oxygen at home:    Productive cough:     Wheezing:         Neurologic    Sudden weakness in arms or legs:     Sudden numbness in arms or legs:  x   Sudden onset of difficulty speaking or slurred speech:    Temporary loss of vision in one eye:     Problems with dizziness:         Gastrointestinal    Blood in stool:     Vomited blood:         Genitourinary    Burning when urinating:     Blood in urine:        Psychiatric    Major depression:         Hematologic    Bleeding problems:    Problems with blood clotting too easily: x       Skin    Rashes or ulcers:        Constitutional    Fever or chills: x    PHYSICAL EXAM:   Vitals:   07/25/20 1429  BP: 135/66  Pulse: 70  Resp: 18  Temp: 98.3 F (36.8 C)  SpO2: 95%  Weight: 44.9 kg  Height: 4'  11" (1.499 m)   GENERAL: The patient is a well-nourished female, in no acute distress. The vital signs are documented above. CARDIAC: There is a regular rate and rhythm.  VASCULAR: I do not detect carotid bruits. She has palpable radial pulses bilaterally. She has palpable femoral, popliteal, and posterior tibial pulses bilaterally.  I cannot palpate dorsalis pedis pulses. She has no significant lower extremity swelling. PULMONARY: There is good air exchange bilaterally without wheezing or rales. ABDOMEN: Soft and non-tender with normal pitched bowel sounds.  I do not palpate an abdominal aortic aneurysm. MUSCULOSKELETAL: There are no major deformities or cyanosis. NEUROLOGIC: No focal weakness or paresthesias are  detected. SKIN: There are no ulcers or rashes noted. PSYCHIATRIC: The patient has a normal affect.  DATA:    ARTERIAL DOPPLER STUDY: I have independently interpreted her arterial Doppler study today.  On the right side there is a triphasic dorsalis pedis and posterior tibial signal.  ABIs 100%.  Toe pressures 121 mmHg.  On the left side there is a triphasic dorsalis pedis and posterior tibial signal.  ABIs 100%.  Toe pressures 128 mmHg.  LABS: I reviewed her labs from 03/14/2020.  Creatinine was 0.72 with a GFR of greater than 60 on 02/05/2020.

## 2020-07-26 ENCOUNTER — Telehealth: Payer: Self-pay | Admitting: *Deleted

## 2020-07-26 NOTE — Telephone Encounter (Signed)
I HEARD BACK FROM PATIENT AND SHE OPTED TO COME THE SAME DAY FOR LAB AND CT

## 2020-07-26 NOTE — Telephone Encounter (Signed)
CALLED PATIENT TO ASK ABOUT COMING IN FOR LABS PRIOR TO CT SCAN ON 07-31-20, SPOKE WITH PATIENT AND SHE WANTS TO TALK TO HER SON THEN CALL ME BACK AND LET Heather [name]

## 2020-07-27 ENCOUNTER — Encounter: Payer: Self-pay | Admitting: Physical Medicine and Rehabilitation

## 2020-07-27 ENCOUNTER — Encounter
Payer: Medicare Other | Attending: Physical Medicine and Rehabilitation | Admitting: Physical Medicine and Rehabilitation

## 2020-07-27 ENCOUNTER — Other Ambulatory Visit: Payer: Self-pay

## 2020-07-27 VITALS — BP 135/66 | HR 70 | Resp 18 | Ht 59.0 in | Wt 99.1 lb

## 2020-07-27 DIAGNOSIS — M797 Fibromyalgia: Secondary | ICD-10-CM

## 2020-07-27 DIAGNOSIS — G4701 Insomnia due to medical condition: Secondary | ICD-10-CM | POA: Diagnosis not present

## 2020-07-27 DIAGNOSIS — M48062 Spinal stenosis, lumbar region with neurogenic claudication: Secondary | ICD-10-CM | POA: Diagnosis not present

## 2020-07-27 MED ORDER — CLORAZEPATE DIPOTASSIUM 7.5 MG PO TABS: 7.5000 mg | ORAL_TABLET | Freq: Two times a day (BID) | ORAL | 5 refills | Status: DC | PRN

## 2020-07-27 MED ORDER — TRAMADOL HCL 50 MG PO TABS: 50.0000 mg | ORAL_TABLET | Freq: Three times a day (TID) | ORAL | 5 refills | Status: DC

## 2020-07-27 NOTE — Progress Notes (Signed)
Subjective:    Patient ID: Heather [name], female    DOB: 10/24/1935, 85 y.o.   MRN: [id number]  HPI  Due to patient's inability to attend in-person appointment today given her impairments, an audio/video tele-health visit is felt to be the most appropriate encounter for this patient at this time. See MyChart message from today for the patient's consent to a tele-health encounter with [place]. This is a follow up tele-visit via phone. The patient is at home. MD is at office.   Heather [name] is an 85 year old woman who presents for follow-up of of lumbar spinal stenosis. Her pain is present in bilateral feet and has been present since January. Average and current pain are currently rated as 8/10. The pain is constant and burning. Pain is worst with ambulation and relieved with rest. She also has left hip pain. She uses a cane and a walker and needs assistance with household activities.   I spoke with her son first. He mentioned that Trazodone 150mg  I had prescribed in the past for insomnia was too much for her and sedated her. He says that she is currently taking Doxepin for sleep and she has been waking up after 2 hours after taking this. He requested that if she doesn't mention her sleep, not to prescribe anything.  PDMP reviewed. She was last prescribed Lyrica 25mg  BID and Tramadol 50mg  BID on 8/02 and 7/29 respectively. Last scripts prior to that were 6 months ago. She asks whether the dosage of the Tramadol can be increased.   8/18 note with Dr. Scot Dock reviewed. During this in-office visit, she described burning pain and paresthesias in both feet. He felt her symptoms were most consistent with neuropathy. She has a history of back surgery and Dr. Scot Dock felt her pain could be referred from degenerative disc disease. ABIs were 100% on his testing.   Pain Inventory Average Pain 8 Pain Right Now 8 My pain is constant and burning  In the last 24  hours, has pain interfered with the following? General activity 3 Relation with others 0 Enjoyment of life 5 What TIME of day is your pain at its worst? morning , daytime, evening and night Sleep (in general) Poor  Pain is worse with: walking, bending, sitting, standing and some activites Pain improves with: rest and medication Relief from Meds: 5  use a cane use a walker  I need assistance with the following:  household duties and shopping  dizziness anxiety  Any changes since last visit?  yes  Saw Dr Scot Dock, he is recommending she see a neurologist  Dr Deitra Mayo [facility]   saw him on Wednesday of this week    Family History  Problem Relation Age of Onset  . Cancer Mother        Unknown type  . Cancer Sister        Breast  . Clotting disorder Neg Hx    Social History   Socioeconomic History  . Marital status: Widowed    Spouse name: Not on file  . Number of children: 1  . Years of education: Not on file  . Highest education level: Not on file  Occupational History  . Not on file  Tobacco Use  . Smoking status: Former Smoker    Packs/day: 0.50    Types: Cigarettes    Start date: 07/16/1960    Quit date: 07/16/1998    Years since quitting: 22.0  . Smokeless tobacco:  Never Used  Vaping Use  . Vaping Use: Never used  Substance and Sexual Activity  . Alcohol use: No    Alcohol/week: 0.0 standard drinks  . Drug use: No  . Sexual activity: Never  Other Topics Concern  . Not on file  Social History Narrative   Widowed.  Lives alone in her own home.  Ambulates with a cane/walker when needed.   Social Determinants of Health   Financial Resource Strain:   . Difficulty of Paying Living Expenses: Not on file  Food Insecurity:   . Worried About Charity fundraiser in the Last Year: Not on file  . Ran Out of Food in the Last Year: Not on file  Transportation Needs:   . Lack of Transportation (Medical): Not on file  . Lack of Transportation (Non-Medical):  Not on file  Physical Activity:   . Days of Exercise per Week: Not on file  . Minutes of Exercise per Session: Not on file  Stress:   . Feeling of Stress : Not on file  Social Connections:   . Frequency of Communication with Friends and Family: Not on file  . Frequency of Social Gatherings with Friends and Family: Not on file  . Attends Religious Services: Not on file  . Active Member of Clubs or Organizations: Not on file  . Attends Archivist Meetings: Not on file  . Marital Status: Not on file   Past Surgical History:  Procedure Laterality Date  . ABDOMINAL HYSTERECTOMY  age 71 or 22  . APPENDECTOMY  yrs ago  . BILATERAL SALPINGOOPHORECTOMY  age 59 or 88  . COLONOSCOPY N/A 11/17/2013   Procedure: COLONOSCOPY;  Surgeon: Cleotis Nipper, MD;  Location: [facility];  Service: Endoscopy;  Laterality: N/A;  . ESOPHAGOGASTRODUODENOSCOPY N/A 11/17/2013   Procedure: ESOPHAGOGASTRODUODENOSCOPY (EGD);  Surgeon: Cleotis Nipper, MD;  Location: [facility];  Service: Endoscopy;  Laterality: N/A;  . HEMORRHOID SURGERY  yrs ago  . TONSILLECTOMY  yrs ago   Past Medical History:  Diagnosis Date  . ANXIETY 07/23/2009  . Atherosclerosis of aorta ([place]) 07/24/2015  . Complication of anesthesia 7 or 8 yrs ago   woke up during colonscopy  . COPD (chronic obstructive pulmonary disease) ([place])   . Coronary atherosclerosis 07/24/2015  . DEPRESSION 07/23/2009  . DIVERTICULOSIS, COLON 07/23/2009  . GERD 07/23/2009  . Headache(784.0) 07/23/2009   occasional  . Hemorrhoids   . HIP PAIN, LEFT 05/16/2010  . History of Crohn's disease   . HYPERLIPIDEMIA 07/23/2009  . HYPERTENSION 07/23/2009  . HYPOTHYROIDISM 07/23/2009  . IBS (irritable bowel syndrome)   . Insomnia   . LOW BACK PAIN, CHRONIC 10/01/2009  . PARESTHESIA 10/01/2009  . Shingles 2006   back   BP 135/66 Comment: last recorded  Pulse 70 Comment: last recorded  Resp 18 Comment: last recorded  Ht 4\' 11"  (1.499 m) Comment: last  recorded  Wt 99 lb 1.6 oz (45 kg) Comment: last recorded  SpO2 97%   BMI 20.02 kg/m   Opioid Risk Score:   Fall Risk Score:  `1  Depression screen PHQ 2/9  Depression screen [facility] 2/9 07/27/2020 11/10/2019 08/03/2018 07/31/2017 07/31/2017 07/31/2017 06/11/2016  Decreased Interest 0 3 1 0 0 0 0  Down, Depressed, Hopeless 0 3 2 0 0 - 0  PHQ - 2 Score 0 6 3 0 0 0 0  Altered sleeping - 3 1 - - - -  Tired, decreased energy - 3 2 - - - -  Change in appetite - 3 0 - - - -  Feeling bad or failure about yourself  - 0 1 - - - -  Trouble concentrating - 3 0 - - - -  Moving slowly or fidgety/restless - 1 0 - - - -  Suicidal thoughts - 0 0 - - - -  PHQ-9 Score - 19 7 - - - -  Difficult doing work/chores - Very difficult Not difficult at all - - - -  Some recent data might be hidden   Review of Systems  Constitutional: Negative.   HENT: Negative.   Eyes: Negative.   Respiratory: Negative.   Cardiovascular: Negative.   Gastrointestinal: Negative.   Endocrine: Negative.   Genitourinary: Negative.   Musculoskeletal: Positive for arthralgias and myalgias.  Allergic/Immunologic: Negative.   Neurological:       Burning type pain  Hematological: Negative.   Psychiatric/Behavioral: The patient is nervous/anxious.   All other systems reviewed and are negative.      Objective:   Physical Exam Not performed as patient was seen via phone visit.        Assessment & Plan:  Heather [name] is an 85 year old woman who presents for follow-up of bilateral painful paraesthesias in her feet that started in January.  -Currently her pain is 8/10 and being managed with Lyrica 25mg  BID and Tramadol 50mg  BID PRN. Last refills provided in early August/late July (PDMP reviewed). Increase Tramadol to three times per day.   -MRI 01/15/2010 reviewed and shows mild disc bulging and facet hypertrophy at L4-L5 with resulting mild lateral recess stenosis bilaterally and asymmetric disc bulging on the left and bilateral  face hypertrophy at L5-1 which is contributing to mild left foraminal stenosis without definite nerve root encroachment.   -Left hip XR from 02/03/20 reviewed and is normal.   -Her son reports insomnia. She was too drowsy with the Trazodone in the past. She is currently on Doxepin. She still wakes every 2 hours with this.   -She asks that I also send her Clorazepate 7.5mg  BID PRN for anxiety. She was on this dose prior to her doctor retiring and it really helped with her anxiety.   20 minutes spent in discussing insomnia with son, pain and anxiety with Heather [name].

## 2020-07-30 ENCOUNTER — Ambulatory Visit: Payer: Self-pay | Admitting: Radiation Oncology

## 2020-07-31 ENCOUNTER — Ambulatory Visit (HOSPITAL_COMMUNITY)
Admission: RE | Admit: 2020-07-31 | Discharge: 2020-07-31 | Disposition: A | Discharge status: Home / Self Care | Payer: Medicare Other | Source: Ambulatory Visit | Attending: Radiation Oncology | Admitting: Radiation Oncology

## 2020-07-31 ENCOUNTER — Encounter (HOSPITAL_COMMUNITY): Payer: Self-pay

## 2020-07-31 ENCOUNTER — Ambulatory Visit
Admission: RE | Admit: 2020-07-31 | Discharge: 2020-07-31 | Disposition: A | Discharge status: Home / Self Care | Payer: Medicare Other | Source: Ambulatory Visit | Attending: Radiation Oncology | Admitting: Radiation Oncology

## 2020-07-31 ENCOUNTER — Other Ambulatory Visit: Payer: Self-pay

## 2020-07-31 DIAGNOSIS — J8410 Pulmonary fibrosis, unspecified: Secondary | ICD-10-CM | POA: Diagnosis not present

## 2020-07-31 DIAGNOSIS — J432 Centrilobular emphysema: Secondary | ICD-10-CM | POA: Diagnosis not present

## 2020-07-31 DIAGNOSIS — C3431 Malignant neoplasm of lower lobe, right bronchus or lung: Secondary | ICD-10-CM

## 2020-07-31 DIAGNOSIS — C3490 Malignant neoplasm of unspecified part of unspecified bronchus or lung: Secondary | ICD-10-CM | POA: Diagnosis not present

## 2020-07-31 DIAGNOSIS — I700 Atherosclerosis of aorta: Secondary | ICD-10-CM | POA: Diagnosis not present

## 2020-07-31 HISTORY — DX: Malignant (primary) neoplasm, unspecified: C80.1

## 2020-07-31 LAB — BUN & CREATININE (CHCC)
BUN: 17 mg/dL (ref 8–23)
Creatinine: 0.81 mg/dL (ref 0.44–1.00)
GFR, Est AFR Am: >60 mL/min (ref >60)
GFR, Estimated: >60 mL/min (ref >60)

## 2020-07-31 MED ORDER — SODIUM CHLORIDE (PF) 0.9 % IJ SOLN
INTRAMUSCULAR | Status: AC
  Filled 2020-07-31: qty 50

## 2020-07-31 MED ORDER — IOHEXOL 300 MG/ML  SOLN
75.0000 mL | Freq: Once | INTRAMUSCULAR | Status: AC | PRN
  Administered 2020-07-31: 75 mL via INTRAVENOUS

## 2020-08-06 ENCOUNTER — Telehealth: Payer: Self-pay | Admitting: *Deleted

## 2020-08-06 ENCOUNTER — Telehealth: Payer: Self-pay

## 2020-08-06 ENCOUNTER — Ambulatory Visit
Admission: RE | Admit: 2020-08-06 | Discharge: 2020-08-06 | Disposition: A | Discharge status: Home / Self Care | Payer: Medicare Other | Source: Ambulatory Visit | Attending: Radiation Oncology | Admitting: Radiation Oncology

## 2020-08-06 ENCOUNTER — Other Ambulatory Visit: Payer: Self-pay

## 2020-08-06 ENCOUNTER — Encounter: Payer: Self-pay | Admitting: Radiation Oncology

## 2020-08-06 DIAGNOSIS — Z8709 Personal history of other diseases of the respiratory system: Secondary | ICD-10-CM | POA: Diagnosis not present

## 2020-08-06 DIAGNOSIS — C3431 Malignant neoplasm of lower lobe, right bronchus or lung: Secondary | ICD-10-CM

## 2020-08-06 DIAGNOSIS — R918 Other nonspecific abnormal finding of lung field: Secondary | ICD-10-CM | POA: Diagnosis not present

## 2020-08-06 DIAGNOSIS — C3490 Malignant neoplasm of unspecified part of unspecified bronchus or lung: Secondary | ICD-10-CM

## 2020-08-06 DIAGNOSIS — I899 Noninfective disorder of lymphatic vessels and lymph nodes, unspecified: Secondary | ICD-10-CM | POA: Diagnosis not present

## 2020-08-06 DIAGNOSIS — J449 Chronic obstructive pulmonary disease, unspecified: Secondary | ICD-10-CM | POA: Diagnosis not present

## 2020-08-06 DIAGNOSIS — Z08 Encounter for follow-up examination after completed treatment for malignant neoplasm: Secondary | ICD-10-CM | POA: Diagnosis not present

## 2020-08-06 NOTE — Telephone Encounter (Signed)
Called patient's son [name] to inform of mom's Pet for 08-10-20 - arrival time- 12:30 pm @ [facility], patient to have water only - 6 hrs. prior to test, spoke with patient's son [name] and he is aware of this patient's test

## 2020-08-06 NOTE — Telephone Encounter (Signed)
Spoke with patient in regards to telephone visit with Shona Simpson PA on 08/06/20 at 3:00pm. Patient verbalized understanding of appointment date and time. Meaningful use questions were reviewed. TM

## 2020-08-06 NOTE — Progress Notes (Signed)
Radiation Oncology         [phone] ________________________________  Outpatient Follow Up - Conducted via telephone due to current COVID-19 concerns for limiting patient exposure  I spoke with the patient to conduct this consult visit via telephone to spare the patient unnecessary potential exposure in the healthcare setting during the current COVID-19 pandemic. The patient was notified in advance and was offered a [place] meeting to allow for face to face communication but unfortunately reported that they did not have the appropriate resources/technology to support such a visit and instead preferred to proceed with a telephone visit.   Name: Heather [name]        MRN: [id number]  Date of Service: 08/06/2020 DOB: 12-10-1934  ZD:GLOVFI, Marvis Repress, FNP  Marrian Salvage,*     REFERRING PHYSICIAN: Marrian Salvage,*   DIAGNOSIS: The primary encounter diagnosis was Malignant neoplasm of lower lobe of right lung ([place]). A diagnosis of Malignant neoplasm of unspecified part of unspecified bronchus or lung ([place]) was also pertinent to this visit.   HISTORY OF PRESENT ILLNESS: Heather [name] is a 85 y.o. female with a history of putative Stage IA2, cT1bN0M0, NSCLC of the right lower lobe. The paitent has a history of COPD controlled on 3 inhalers and who had a CT in February 2019 that revealed a 1 cm mass in the right lower lobe. PET on 06/28/2018 showed SUV of the lesion was 2.77, with a blood pool of 2.23.  The lesion did measure 12 mm.  No additional evidence of adenopathy was identified.  She was counseled on repeat imaging and underwent this on 10/18/2018.  The lesion now measured 15 x 13 mm in comparison to her prior imaging.  No adenopathy is identified.  She was against moving forward with aggressive work-up, and would not be a good surgical candidate.  She proceeded with stereotactic body radiotherapy (SBRT) which she completed in January 2020. She has been NED since  though she also has compression deformity seen at L2, as well as atherosclerotic disease in the aorta and emphysema. Her most recent scan on 07/31/2020 revealed continued masslike fibrosis in the right lower lobe that had improved slightly now measuring 3.8 x 4.7 cm, read as grossly unchanged but residual viable tumor was difficult to exclude by CT, she also had a 10 mm intralobular right lower lobe nodule unchanged but considered suspicious and a 5 mm right hilar lymph node within normal limits.  There was concern for some small upper abdominal lymph nodes including 8 mm portacaval node, and 10 mm periaortic node.  Given these findings it was recommended that she pursue pet imaging which Dr. Lisbeth Renshaw agrees with.  She is contacted today to discuss these results.   On review review of systems the patient reports that she is doing overall well given her age and health.  She denies any shortness of breath fevers or chills.  She states that she is having abnormal area in her abdominal wall that has waxed and waned over time.  She also reports that in the last few months she has had a few episodes of feeling as though her abdomen will have some discomfort deep that is described as somewhat of a gnawing aching pain.  She denies any regular episodes of this or predictability of her symptoms.  No other symptoms of bowel or bladder dysfunction is identified.  No fevers or chills.  No other complaints are otherwise verbalized.    PREVIOUS RADIATION THERAPY:  12/15/18-12/22/2018 SBRT Treatment: The RLL Target was treated to 54 Gy in 3 fractions  PAST MEDICAL HISTORY:  Past Medical History:  Diagnosis Date   ANXIETY 07/23/2009   Atherosclerosis of aorta ([place]) 04/16/2584   Complication of anesthesia 7 or 8 yrs ago   woke up during colonscopy   COPD (chronic obstructive pulmonary disease) ([place])    Coronary atherosclerosis 07/24/2015   DEPRESSION 07/23/2009   DIVERTICULOSIS, COLON 07/23/2009   GERD 07/23/2009     Headache(784.0) 07/23/2009   occasional   Hemorrhoids    HIP PAIN, LEFT 05/16/2010   History of Crohn's disease    HYPERLIPIDEMIA 07/23/2009   HYPERTENSION 07/23/2009   HYPOTHYROIDISM 07/23/2009   IBS (irritable bowel syndrome)    Insomnia    LOW BACK PAIN, CHRONIC 10/01/2009   lung ca dx'd 2019   SBRT comp 12/2018   PARESTHESIA 10/01/2009   Shingles 2006   back       PAST SURGICAL HISTORY: Past Surgical History:  Procedure Laterality Date   ABDOMINAL HYSTERECTOMY  age 73 or 45   APPENDECTOMY  yrs ago   BILATERAL SALPINGOOPHORECTOMY  age 62 or 31   COLONOSCOPY N/A 11/17/2013   Procedure: COLONOSCOPY;  Surgeon: Cleotis Nipper, MD;  Location: [facility];  Service: Endoscopy;  Laterality: N/A;   ESOPHAGOGASTRODUODENOSCOPY N/A 11/17/2013   Procedure: ESOPHAGOGASTRODUODENOSCOPY (EGD);  Surgeon: Cleotis Nipper, MD;  Location: [facility];  Service: Endoscopy;  Laterality: N/A;   HEMORRHOID SURGERY  yrs ago   TONSILLECTOMY  yrs ago     FAMILY HISTORY:  Family History  Problem Relation Age of Onset   Cancer Mother        Unknown type   Cancer Sister        Breast   Clotting disorder Neg Hx      SOCIAL HISTORY:  reports that she quit smoking about 22 years ago. Her smoking use included cigarettes. She started smoking about 60 years ago. She smoked 0.50 packs per day. She has never used smokeless tobacco. She reports that she does not drink alcohol and does not use drugs. The patient is widowed and lives in [place]. Her son Remo Lipps is an active participant in her medical care.   ALLERGIES: Tylenol [acetaminophen] and Symbicort [budesonide-formoterol fumarate]   MEDICATIONS:  Current Outpatient Medications  Medication Sig Dispense Refill   Albuterol Sulfate (PROAIR RESPICLICK) 277 (90 Base) MCG/ACT AEPB Inhale 2 puffs into the lungs 4 (four) times daily as needed. (Patient taking differently: Inhale 2 puffs into the lungs 4 (four) times daily as  needed (sob and wheezing). ) 1 each 6   amLODipine (NORVASC) 10 MG tablet TAKE (1) TABLET DAILY FOR HIGH BLOOD PRESSURE. 90 tablet 0   benzonatate (TESSALON) 100 MG capsule TAKE 1 CAPSULE TWICE DAILY AS NEEDED FOR COUGH. 60 capsule 0   BREO ELLIPTA 200-25 MCG/INH AEPB INHALE 1 PUFF INTO THE LUNGS ONCE DAILY. 180 each 0   Cholecalciferol (VITAMIN D3 PO) Take 1 tablet by mouth daily.     clorazepate (TRANXENE) 7.5 MG tablet Take 1 tablet (7.5 mg total) by mouth 2 (two) times daily as needed for anxiety. 60 tablet 5   doxepin (SINEQUAN) 10 MG capsule TAKE 1 TABLET AT BEDTIME AS NEEDED. 90 capsule 0   esomeprazole (NEXIUM) 40 MG capsule Take 1 capsule (40 mg total) by mouth daily. 90 capsule 3   folic acid (FOLVITE) 1 MG tablet TAKE 1 TABLET ONCE DAILY. 90 tablet 3   furosemide (LASIX) 20 MG  tablet TAKE 1 TABLET DAILY IF NEEDED FOR SIGNIFICANT LOWER EXTREMITY SWELLING. 90 tablet 0   gabapentin (NEURONTIN) 100 MG capsule Take 1 capsule (100 mg total) by mouth 3 (three) times daily. 90 capsule 3   levothyroxine (SYNTHROID) 25 MCG tablet TAKE 1 TABLET DAILY BEFORE BREAKFAST. 90 tablet 2   losartan (COZAAR) 25 MG tablet TAKE (1) TABLET DAILY FOR HIGH BLOOD PRESSURE. 90 tablet 0   MYRBETRIQ 25 MG TB24 tablet TAKE 1 TABLET ONCE DAILY. 30 tablet 3   oxybutynin (DITROPAN-XL) 5 MG 24 hr tablet TAKE 1 TABLET IN THE MORNING FOR BLADDER CONTROL. 90 tablet 0   potassium chloride SA (KLOR-CON) 20 MEQ tablet TAKE 1 TABLET ONCE DAILY. 90 tablet 0   pregabalin (LYRICA) 25 MG capsule TAKE (1) CAPSULE TWICE DAILY. 60 capsule 0   promethazine (PHENERGAN) 25 MG tablet TAKE 1 TABLET EVERY 8 HOURS AS NEEDED FOR NAUSEA & VOMITING. 30 tablet 0   senna-docusate (SENOKOT-S) 8.6-50 MG tablet Take 2 tablets by mouth at bedtime. (Patient taking differently: Take 2 tablets by mouth at bedtime as needed (constipation). ) 60 tablet 2   sertraline (ZOLOFT) 50 MG tablet TAKE 1 TABLET EACH DAY. 90 tablet 3    traMADol (ULTRAM) 50 MG tablet Take 1 tablet (50 mg total) by mouth in the morning, at noon, and at bedtime. 90 tablet 5   VITAMIN D-1000 MAX ST 25 MCG (1000 UT) tablet Take 1,000 Units by mouth daily.     No current facility-administered medications for this encounter.      PHYSICAL EXAM:  Unable to assess due to encounter type.  ECOG = 0  0 - Asymptomatic (Fully active, able to carry on all predisease activities without restriction)  1 - Symptomatic but completely ambulatory (Restricted in physically strenuous activity but ambulatory and able to carry out work of a light or sedentary nature. For example, light housework, office work)  2 - Symptomatic, <50% in bed during the day (Ambulatory and capable of all self care but unable to carry out any work activities. Up and about more than 50% of waking hours)  3 - Symptomatic, >50% in bed, but not bedbound (Capable of only limited self-care, confined to bed or chair 50% or more of waking hours)  4 - Bedbound (Completely disabled. Cannot carry on any self-care. Totally confined to bed or chair)  5 - Death   Eustace Pen MM, Creech RH, Tormey DC, et al. [phone]). "Toxicity and response criteria of the [employer]". [place] Oncol. 5 (6): 649-55    LABORATORY DATA:  Lab Results  Component Value Date   WBC 9.5 03/14/2020   HGB 11.5 (L) 03/14/2020   HCT 36.0 03/14/2020   MCV 83.5 03/14/2020   PLT 410.0 (H) 03/14/2020   Lab Results  Component Value Date   NA 137 03/14/2020   K 4.0 03/14/2020   CL 102 03/14/2020   CO2 29 03/14/2020   Lab Results  Component Value Date   ALT 8 03/14/2020   AST 12 03/14/2020   ALKPHOS 74 03/14/2020   BILITOT 0.4 03/14/2020      RADIOGRAPHY: CT CHEST W CONTRAST  Result Date: 08/01/2020 CLINICAL DATA:  Non-small cell lung cancer, status post SBRT in January 2020, for restaging EXAM: CT CHEST WITH CONTRAST TECHNIQUE: Multidetector CT imaging of the chest was performed during  intravenous contrast administration. CONTRAST:  64mL OMNIPAQUE IOHEXOL 300 MG/ML  SOLN COMPARISON:  04/19/2020 FINDINGS: Cardiovascular: The heart is normal in size.  No pericardial effusion. No evidence of thoracic aortic aneurysm. Atherosclerotic calcifications of the aortic arch. Three vessel coronary atherosclerosis. Mediastinum/Nodes: No suspicious mediastinal or axillary lymphadenopathy. 5 mm short axis right hilar node, within normal limits. 10 mm short axis intralobar right lower lobe node (series 2/image 83), unchanged. Visualized thyroid is unremarkable. Lungs/Pleura: Moderate centrilobular and paraseptal emphysematous changes, upper lung predominant. 3.8 x 4.7 cm area of masslike fibrosis in the right lower lobe (series 7/image 106), previously 4.4 x 4.6 cm, likely grossly unchanged, with differences in measurement due to slice selection. Residual viable tumor is difficult to exclude on CT. No focal consolidation. No pleural effusion or pneumothorax. Upper Abdomen: Small upper abdominal nodes, including an 8 mm short axis portacaval node (series 2/image 142). A 10 mm short axis para-aortic node may be present (series 2/image 146), but is incompletely visualized/characterized. Musculoskeletal: Prior vertebral augmentation at L2. IMPRESSION: Masslike fibrosis in the right lower lobe, likely grossly unchanged. Residual viable tumor is difficult to exclude on CT. 10 mm short axis intralobar right lower lobe node, unchanged but considered suspicious. 5 mm short axis right hilar node, within normal limits. Given these overall findings, PET-CT is suggested for further evaluation/definitive characterization. Aortic Atherosclerosis (ICD10-I70.0) and Emphysema (ICD10-J43.9). Electronically Signed   By: Julian Hy M.D.   On: 08/01/2020 08:19   VAS [place] ABI WITH/WO TBI  Result Date: 07/25/2020 LOWER EXTREMITY DOPPLER STUDY Indications: Claudication, cold feet and hands. High Risk Factors: Hypertension,  coronary artery disease.  Performing Technologist: Ronal Fear RVS, RCS  Examination Guidelines: A complete evaluation includes at minimum, Doppler waveform signals and systolic blood pressure reading at the level of bilateral brachial, anterior tibial, and posterior tibial arteries, when vessel segments are accessible. Bilateral testing is considered an integral part of a complete examination. Photoelectric Plethysmograph (PPG) waveforms and toe systolic pressure readings are included as required and additional duplex testing as needed. Limited examinations for reoccurring indications may be performed as noted.  ABI Findings: +---------+------------------+-----+---------+--------+  Right     Rt Pressure (mmHg) Index Waveform  Comment   +---------+------------------+-----+---------+--------+  Brachial  152                                          +---------+------------------+-----+---------+--------+  PTA       171                1.03  triphasic           +---------+------------------+-----+---------+--------+  DP        165                0.99  triphasic           +---------+------------------+-----+---------+--------+  Great Toe 121                0.73                      +---------+------------------+-----+---------+--------+ +---------+------------------+-----+---------+-------+  Left      Lt Pressure (mmHg) Index Waveform  Comment  +---------+------------------+-----+---------+-------+  Brachial  166                                         +---------+------------------+-----+---------+-------+  PTA       172  1.04  triphasic          +---------+------------------+-----+---------+-------+  DP        161                0.97  triphasic          +---------+------------------+-----+---------+-------+  Great Toe 128                0.77                     +---------+------------------+-----+---------+-------+ +-------+-----------+-----------+------------+------------+  ABI/TBI Today's ABI Today's  TBI Previous ABI Previous TBI  +-------+-----------+-----------+------------+------------+  Right   1.03        0.73                                   +-------+-----------+-----------+------------+------------+  Left    1.04        0.77                                   +-------+-----------+-----------+------------+------------+  Summary: Right: Resting right ankle-brachial index is within normal range. No evidence of significant right lower extremity arterial disease. The right toe-brachial index is normal. Left: Resting left ankle-brachial index is within normal range. No evidence of significant left lower extremity arterial disease. The left toe-brachial index is normal.  *See table(s) above for measurements and observations.  Electronically signed by Deitra Mayo MD on 07/25/2020 at 2:46:10 PM.   Final        IMPRESSION/PLAN: 1. Putative Stage IA2, cT1bN0M0, NSCLC of the right lower lobe. The patient's images appear to be overall stable in appearance regarding her fibrotic changes in the area following treatment to the right lower lobe.  She does have a second lesion in the right lower lobe that is more noticeable than on priors in retrospect by radiology.  She also has a very small but within limits hilar lymph node, and several new newly described nodes in the retroperitoneal space.  Given these findings, further work-up is necessary.  I reviewed the rationale for PET imaging, and the patient is in agreement with this plan. 2. COPD. The patient reports she is still doing well with her inhaled medications. She will follow up with pulmonary medicine as well as PCP. 3. Atherosclerotic changes on CT. this remains stable and will be followed expectantly. 4. L2 compression fracture. The patient remains asymptomatic at this time. We will follow this expectantly.   Given current concerns for patient exposure during the COVID-19 pandemic, this encounter was conducted via telephone.  The patient has  given verbal consent for this type of encounter. The time spent during this encounter was 25 minutes and 50% of that time was spent in the coordination of her care. The attendants for this meeting include Shona Simpson, [facility] and [name]  During the encounter, Shona Simpson [facility] was located at home remotely. FARRAH SKODA  was located at home.     Carola Rhine, PAC

## 2020-08-10 ENCOUNTER — Ambulatory Visit (HOSPITAL_COMMUNITY)
Admission: RE | Admit: 2020-08-10 | Discharge: 2020-08-10 | Disposition: A | Discharge status: Home / Self Care | Payer: Medicare Other | Source: Ambulatory Visit | Attending: Radiation Oncology | Admitting: Radiation Oncology

## 2020-08-10 ENCOUNTER — Other Ambulatory Visit: Payer: Self-pay

## 2020-08-10 DIAGNOSIS — C3431 Malignant neoplasm of lower lobe, right bronchus or lung: Secondary | ICD-10-CM | POA: Diagnosis not present

## 2020-08-10 DIAGNOSIS — C3490 Malignant neoplasm of unspecified part of unspecified bronchus or lung: Secondary | ICD-10-CM

## 2020-08-10 DIAGNOSIS — I2510 Atherosclerotic heart disease of native coronary artery without angina pectoris: Secondary | ICD-10-CM | POA: Diagnosis not present

## 2020-08-10 DIAGNOSIS — I700 Atherosclerosis of aorta: Secondary | ICD-10-CM | POA: Diagnosis not present

## 2020-08-10 DIAGNOSIS — J439 Emphysema, unspecified: Secondary | ICD-10-CM | POA: Diagnosis not present

## 2020-08-10 LAB — GLUCOSE, CAPILLARY: Glucose-Capillary: 88 mg/dL (ref 70–99)

## 2020-08-10 MED ORDER — FLUDEOXYGLUCOSE F - 18 (FDG) INJECTION
4.9000 | Freq: Once | INTRAVENOUS | Status: AC | PRN
  Administered 2020-08-10: 4.9 via INTRAVENOUS

## 2020-08-14 ENCOUNTER — Telehealth: Payer: Self-pay | Admitting: Radiation Oncology

## 2020-08-14 NOTE — Telephone Encounter (Signed)
I called the patient to review her PET scan findings and to let her know that I was going to present her case this week in thoracic oncology conference, but that I suspect they will recommend having a biopsy with EBUS    Carola Rhine, PAC

## 2020-08-14 NOTE — Telephone Encounter (Signed)
Thanks Bryson Ha,  Dr. Ander Slade, does EBUS as well. I will touch base with him and if the patient is interested. Either myself or him will get her in for tissue sampling.  Thanks Garner Nash, DO [facility] 08/14/2020 2:31 PM

## 2020-08-14 NOTE — Telephone Encounter (Signed)
Oh I'm sorry I didn't realize! Now I have both of you to bother when we need EBUS :)  Thanks Bryson Ha

## 2020-08-15 ENCOUNTER — Other Ambulatory Visit: Payer: Self-pay | Admitting: Physical Medicine and Rehabilitation

## 2020-08-15 ENCOUNTER — Other Ambulatory Visit: Payer: Self-pay | Admitting: Family

## 2020-08-16 ENCOUNTER — Telehealth: Payer: Self-pay | Admitting: Pulmonary Disease

## 2020-08-16 ENCOUNTER — Telehealth: Payer: Self-pay | Admitting: Radiation Oncology

## 2020-08-16 ENCOUNTER — Other Ambulatory Visit: Payer: Self-pay | Admitting: *Deleted

## 2020-08-16 NOTE — Telephone Encounter (Signed)
Can we schedule Heather [name] for a office eval to see me as soon as possible  Positive PET scan, may need intervention, this is to evaluate whether we can safely go ahead to perform one

## 2020-08-16 NOTE — Telephone Encounter (Signed)
-----   Message from Hayden Pedro, PA-C sent at 08/16/2020  7:29 AM EDT ----- Hi Dr. Ander Slade- Ms. Ferrufino was discussed in conference this morning, and Dr. Lamonte Sakai and Dr. Roxan Hockey agreed about EBUS to evaluate her right hilar disease seen on PET. If you agree can we get her set up with you for the procedure?  [employer]

## 2020-08-16 NOTE — Progress Notes (Signed)
The proposed treatment discussed in cancer conference is for discussion purpose only and is not a binding recommendation.  The patient have not been physically examined or present for their treatment options.  Therefore, final treatment plans cannot be decided.

## 2020-08-16 NOTE — Telephone Encounter (Signed)
Dr.Olalere I looked at your schedule and all appt's are full for the month of september to get patient in to see you. Would it be ok to double book patient?  Dr.Olalere can you please advise.  Thank you

## 2020-08-16 NOTE — Telephone Encounter (Signed)
I called the patient and reviewed her case was reviewed in conference. Recommendations from conference were to proceed with biopsy and possible sbrt style radiotherapy to avoid chemotherapy.

## 2020-08-17 NOTE — Telephone Encounter (Signed)
Called and spoke with patient. She stated that we will need to contact her son Heather [name] since she does not drive. Called and spoke with Heather [name]. She has been scheduled for the next date AO is in office. 08/27/20 at 145pm.

## 2020-08-17 NOTE — Telephone Encounter (Signed)
Yes, she can be double booked

## 2020-08-27 ENCOUNTER — Ambulatory Visit: Payer: Medicare Other | Admitting: Pulmonary Disease

## 2020-08-27 ENCOUNTER — Encounter: Payer: Self-pay | Admitting: Pulmonary Disease

## 2020-08-27 ENCOUNTER — Other Ambulatory Visit: Payer: Self-pay

## 2020-08-27 VITALS — BP 124/54 | HR 72 | Temp 97.5°F | Ht 59.0 in | Wt 115.4 lb

## 2020-08-27 DIAGNOSIS — R9389 Abnormal findings on diagnostic imaging of other specified body structures: Secondary | ICD-10-CM

## 2020-08-27 DIAGNOSIS — J438 Other emphysema: Secondary | ICD-10-CM

## 2020-08-27 DIAGNOSIS — C3431 Malignant neoplasm of lower lobe, right bronchus or lung: Secondary | ICD-10-CM

## 2020-08-27 NOTE — Progress Notes (Signed)
Heather [name]    [id number]    [birth year]  Primary Care Physician:Murray, Marvis Repress, FNP  Referring Physician: Marrian Salvage, [facility] [address] [place],  [place] [address]  Chief complaint:   Follow-up abnormal PET scan showing active hilar lymph node  HPI:  Status post radiation treatment for presumed lung cancer PET positive, Had a fall recently causing compression fractures  Patient had a recent PET scan 08/10/2020 revealing a positive hilar node  She has been doing well with respect to her breathing, denies a cough She is still quite frail Denies any recent problems with her breathing Has chronic obstructive pulmonary disease controlled on inhalers  Historically She had had a CT scan of the chest in February showing a 1 cm opacity in the right lower lobe-could be related to mucous plugging Repeat CT in July 2019 did reveal a 1.1 cm opacity-this was followed up with a PET scan with an SUV of 2.7-we did look at the CT together during the last visit We discussed options of intervention which at present is very risky as she is quite frail We agreed on repeating CT after 3 months to see if there is any significant evolution-the repeat CT does reveal increase in the size of the nodule  Occupation: No significant occupational history Exposures: No exposure to mold Smoking history: Reformed smoker Travel history: No Significant travel  Outpatient Encounter Medications as of 08/27/2020  Medication Sig  . Albuterol Sulfate (PROAIR RESPICLICK) 937 (90 Base) MCG/ACT AEPB Inhale 2 puffs into the lungs 4 (four) times daily as needed. (Patient taking differently: Inhale 2 puffs into the lungs 4 (four) times daily as needed (sob and wheezing). )  . amLODipine (NORVASC) 10 MG tablet TAKE (1) TABLET DAILY FOR HIGH BLOOD PRESSURE.  . benzonatate (TESSALON) 100 MG capsule TAKE 1 CAPSULE TWICE DAILY AS NEEDED FOR COUGH.  [name] BREO ELLIPTA 200-25 MCG/INH AEPB INHALE  1 PUFF INTO THE LUNGS ONCE DAILY.  [name] Cholecalciferol (VITAMIN D3 PO) Take 1 tablet by mouth daily.  . clorazepate (TRANXENE) 7.5 MG tablet Take 1 tablet (7.5 mg total) by mouth 2 (two) times daily as needed for anxiety.  [name] doxepin (SINEQUAN) 10 MG capsule TAKE 1 TABLET AT BEDTIME AS NEEDED.  [name] esomeprazole (NEXIUM) 40 MG capsule Take 1 capsule (40 mg total) by mouth daily.  . folic acid (FOLVITE) 1 MG tablet TAKE 1 TABLET ONCE DAILY.  . furosemide (LASIX) 20 MG tablet TAKE 1 TABLET DAILY IF NEEDED FOR SIGNIFICANT LOWER EXTREMITY SWELLING.  . gabapentin (NEURONTIN) 100 MG capsule Take 1 capsule (100 mg total) by mouth 3 (three) times daily.  [name] levothyroxine (SYNTHROID) 25 MCG tablet TAKE 1 TABLET DAILY BEFORE BREAKFAST.  [name] losartan (COZAAR) 25 MG tablet TAKE (1) TABLET DAILY FOR HIGH BLOOD PRESSURE.  . MYRBETRIQ 25 MG TB24 tablet TAKE 1 TABLET ONCE DAILY.  [name] oxybutynin (DITROPAN-XL) 5 MG 24 hr tablet TAKE 1 TABLET IN THE MORNING FOR BLADDER CONTROL.  [name] potassium chloride SA (KLOR-CON) 20 MEQ tablet TAKE 1 TABLET ONCE DAILY.  [name] pregabalin (LYRICA) 25 MG capsule TAKE (1) CAPSULE TWICE DAILY.  [name] promethazine (PHENERGAN) 25 MG tablet TAKE 1 TABLET EVERY 8 HOURS AS NEEDED FOR NAUSEA & VOMITING.  . senna-docusate (SENOKOT-S) 8.6-50 MG tablet Take 2 tablets by mouth at bedtime. (Patient taking differently: Take 2 tablets by mouth at bedtime as needed (constipation). )  . sertraline (ZOLOFT) 50 MG tablet TAKE 1 TABLET  EACH DAY.  . traMADol (ULTRAM) 50 MG tablet Take 1 tablet (50 mg total) by mouth in the morning, at noon, and at bedtime.  [name] VITAMIN D-1000 MAX ST 25 MCG (1000 UT) tablet Take 1,000 Units by mouth daily.  . [DISCONTINUED] pregabalin (LYRICA) 25 MG capsule Take 1 capsule (25 mg total) by mouth daily.   No facility-administered encounter medications on file as of 08/27/2020.    Allergies as of 08/27/2020 - Review Complete 08/27/2020  Allergen Reaction Noted  . Tylenol [acetaminophen]  Other (See Comments) 12/11/2015  . Symbicort [budesonide-formoterol fumarate] Other (See Comments) 10/04/2013    Past Medical History:  Diagnosis Date  . ANXIETY 07/23/2009  . Atherosclerosis of aorta ([place]) 07/24/2015  . Complication of anesthesia 7 or 8 yrs ago   woke up during colonscopy  . COPD (chronic obstructive pulmonary disease) ([place])   . Coronary atherosclerosis 07/24/2015  . DEPRESSION 07/23/2009  . DIVERTICULOSIS, COLON 07/23/2009  . GERD 07/23/2009  . Headache(784.0) 07/23/2009   occasional  . Hemorrhoids   . HIP PAIN, LEFT 05/16/2010  . History of Crohn's disease   . HYPERLIPIDEMIA 07/23/2009  . HYPERTENSION 07/23/2009  . HYPOTHYROIDISM 07/23/2009  . IBS (irritable bowel syndrome)   . Insomnia   . LOW BACK PAIN, CHRONIC 10/01/2009  . lung ca dx'd 2019   SBRT comp 12/2018  . PARESTHESIA 10/01/2009  . Shingles 2006   back    Past Surgical History:  Procedure Laterality Date  . ABDOMINAL HYSTERECTOMY  age 28 or 24  . APPENDECTOMY  yrs ago  . BILATERAL SALPINGOOPHORECTOMY  age 46 or 79  . COLONOSCOPY N/A 11/17/2013   Procedure: COLONOSCOPY;  Surgeon: Cleotis Nipper, MD;  Location: [facility];  Service: Endoscopy;  Laterality: N/A;  . ESOPHAGOGASTRODUODENOSCOPY N/A 11/17/2013   Procedure: ESOPHAGOGASTRODUODENOSCOPY (EGD);  Surgeon: Cleotis Nipper, MD;  Location: [facility];  Service: Endoscopy;  Laterality: N/A;  . HEMORRHOID SURGERY  yrs ago  . TONSILLECTOMY  yrs ago    Family History  Problem Relation Age of Onset  . Cancer Mother        Unknown type  . Cancer Sister        Breast  . Clotting disorder Neg Hx     Social History   Socioeconomic History  . Marital status: Widowed    Spouse name: Not on file  . Number of children: 1  . Years of education: Not on file  . Highest education level: Not on file  Occupational History  . Not on file  Tobacco Use  . Smoking status: Former Smoker    Packs/day: 0.50    Types: Cigarettes    Start date:  07/16/1960    Quit date: 07/16/1998    Years since quitting: 22.1  . Smokeless tobacco: Never Used  Vaping Use  . Vaping Use: Never used  Substance and Sexual Activity  . Alcohol use: No    Alcohol/week: 0.0 standard drinks  . Drug use: No  . Sexual activity: Never  Other Topics Concern  . Not on file  Social History Narrative   Widowed.  Lives alone in her own home.  Ambulates with a cane/walker when needed.   Social Determinants of Health   Financial Resource Strain:   . Difficulty of Paying Living Expenses: Not on file  Food Insecurity:   . Worried About Charity fundraiser in the Last Year: Not on file  . Ran Out of Food in the Last Year: Not on file  Transportation Needs:   . Film/video editor (Medical): Not on file  . Lack of Transportation (Non-Medical): Not on file  Physical Activity:   . Days of Exercise per Week: Not on file  . Minutes of Exercise per Session: Not on file  Stress:   . Feeling of Stress : Not on file  Social Connections:   . Frequency of Communication with Friends and Family: Not on file  . Frequency of Social Gatherings with Friends and Family: Not on file  . Attends Religious Services: Not on file  . Active Member of Clubs or Organizations: Not on file  . Attends Archivist Meetings: Not on file  . Marital Status: Not on file  Intimate Partner Violence:   . Fear of Current or Ex-Partner: Not on file  . Emotionally Abused: Not on file  . Physically Abused: Not on file  . Sexually Abused: Not on file    Review of systems: Constitutional: No fever, no chills Denies any significant respiratory complaints Has significant back pain Limited with activities of daily living Still tries to get some troubles done  Physical Exam:  Vitals:   08/27/20 1353  BP: (!) 124/54  Pulse: 72  Temp: (!) 97.5 F (36.4 C)  SpO2: 96%   Gen:   Frail elderly lady Neck:     Moist oral mucosa Lungs:    No rales, no wheezes, decreased air  movement CV:         S1-S2 appreciated No edema  Data Reviewed: .  CT scan of the chest reviewed showing scarring at the site of previous lung nodule . PET scan from 08/10/2020 reviewed with the patient and her son  Assessment:   Presumed lung cancer status post radiation treatment Tolerated radiation treatment well  Prominent active right hilar node  Chronic obstructive pulmonary disease/emphysema  Shortness of breath with activity  Plan/Recommendations:  Continue lines of care  Discussion today centered around biopsy process and health generally  Patient states she does not want to consider chemotherapy as an option of treatment and a discussion  about if she does not want any intervention, should we go after biopsy with the risks involved She will have about a week or so to consider options Bronchoscopy was discussed with needle aspiration of the lymph node as possibilities  Her breathing has been relatively stable recently but she is very frail  I will reach out to patient in about a week to find out if she has any other questions   Sherrilyn Rist MD [facility] 08/27/2020, 2:29 PM  CC: Marrian Salvage,*

## 2020-08-27 NOTE — Patient Instructions (Signed)
Flu shot today  Think about your decision about not wanting chemotherapy  The spot in the lung will likely spread if nothing is done  Bronchoscopy with biopsy has to be balanced with how your health is being generally and whether we want treatment  If you desire treatment for what is going on at present, we will have to get a biopsy and the attendant risk with obtaining the biopsy  You can call if you are still having any questions or concerns  I will be in touch with you in the next couple of weeks to figure out if you have any questions or concerns

## 2020-09-06 ENCOUNTER — Telehealth: Payer: Self-pay | Admitting: Pulmonary Disease

## 2020-09-06 ENCOUNTER — Other Ambulatory Visit: Payer: Self-pay | Admitting: Family

## 2020-09-06 NOTE — Telephone Encounter (Signed)
Discussed with Dr. Lisbeth Renshaw of radiation oncology regarding possibility of radiation treatment for the lymph node has Heather [name] is against chemotherapy as an option of treatment  I did have a visit with her in the office, she is too frail to tolerate invasive EBUS bronchoscopy  I will update Ms. Ruggerio about this, radiation oncology will see her for possible radiation treatment  Left message for [name] on documented phone #[phone]   Please update patient's son if he calls

## 2020-09-06 NOTE — Telephone Encounter (Signed)
Noted. Will leave encounter open to see if pt's son [name] calls back.

## 2020-09-07 ENCOUNTER — Other Ambulatory Visit: Payer: Self-pay | Admitting: Family

## 2020-09-07 MED ORDER — BREO ELLIPTA 200-25 MCG/INH IN AEPB: INHALATION_SPRAY | RESPIRATORY_TRACT | 0 refills | Status: DC

## 2020-09-12 ENCOUNTER — Telehealth: Payer: Self-pay | Admitting: Radiation Oncology

## 2020-09-12 NOTE — Telephone Encounter (Signed)
I called the patient's son but had to leave a message, but I was able to review patient to review the discussion with Dr. Lisbeth Renshaw regarding her findings by CT scan.  She is not a good candidate for bronchoscopic evaluation, Dr. Lisbeth Renshaw favors treating this site putatively with stereotactic style radiotherapy but would recommend that because of the central location this be offered treatment in 8-10 fractions.  We reviewed the risks, benefits, short, and long term effects of therapy and she is interested in coming in. She will come for simulation next Thursday afternoon.

## 2020-09-13 ENCOUNTER — Other Ambulatory Visit: Payer: Self-pay | Admitting: Physical Medicine and Rehabilitation

## 2020-09-13 ENCOUNTER — Other Ambulatory Visit: Payer: Self-pay | Admitting: Internal Medicine

## 2020-09-13 DIAGNOSIS — B0229 Other postherpetic nervous system involvement: Secondary | ICD-10-CM

## 2020-09-17 ENCOUNTER — Other Ambulatory Visit: Payer: Self-pay | Admitting: Physical Medicine and Rehabilitation

## 2020-09-18 ENCOUNTER — Other Ambulatory Visit: Payer: Self-pay | Admitting: Physical Medicine and Rehabilitation

## 2020-09-18 NOTE — Telephone Encounter (Signed)
Refilled Pregabalin, called [facility] to see if they received prescription from 09/13/2020 and stated no, gave verbal today.

## 2020-09-20 ENCOUNTER — Ambulatory Visit
Admission: RE | Admit: 2020-09-20 | Discharge: 2020-09-20 | Disposition: A | Discharge status: Home / Self Care | Payer: Medicare Other | Source: Ambulatory Visit | Attending: Radiation Oncology | Admitting: Radiation Oncology

## 2020-09-20 ENCOUNTER — Other Ambulatory Visit: Payer: Self-pay

## 2020-09-20 DIAGNOSIS — C3431 Malignant neoplasm of lower lobe, right bronchus or lung: Secondary | ICD-10-CM | POA: Diagnosis not present

## 2020-10-02 ENCOUNTER — Ambulatory Visit: Payer: Medicare Other | Admitting: Radiation Oncology

## 2020-10-03 ENCOUNTER — Ambulatory Visit: Payer: Medicare Other | Admitting: Radiation Oncology

## 2020-10-04 ENCOUNTER — Ambulatory Visit: Payer: Medicare Other | Admitting: Radiation Oncology

## 2020-10-05 ENCOUNTER — Ambulatory Visit: Payer: Medicare Other | Admitting: Radiation Oncology

## 2020-10-08 ENCOUNTER — Telehealth: Payer: Self-pay | Admitting: Physician Assistant

## 2020-10-08 ENCOUNTER — Encounter: Payer: Self-pay | Admitting: *Deleted

## 2020-10-08 ENCOUNTER — Ambulatory Visit: Payer: Medicare Other | Admitting: Radiation Oncology

## 2020-10-08 DIAGNOSIS — R918 Other nonspecific abnormal finding of lung field: Secondary | ICD-10-CM

## 2020-10-08 NOTE — Telephone Encounter (Signed)
Received a new pt referral from Dr. Lisbeth Renshaw for lung cancer. Ms. Hartley has eben scheduled to see Cassie on 11/3 at 11am w/labs at 1030am. Pt aware toa rrive 15 minutes early.

## 2020-10-08 NOTE — Progress Notes (Signed)
[place] [phone]   Fax:([phone]) [phone]  CONSULT NOTE  REFERRING PHYSICIAN: Dr. Lisbeth Renshaw  REASON FOR CONSULTATION:  Lung Malignancy  HPI Heather [name] is a 85 y.o. female with a past medical history significant for COPD, hypertension, coronary artery disease, colitis, small bowel obstruction, hypothyroidism, GERD, diverticulosis, anxiety, and esophageal stricture is referred to the clinic for evaluation of presumed non-small cell lung cancer.  The patient's evaluation began in February 2019 when the patient had a CT angiogram  performed which noted a 1 cm nodule in the right lower lobe.  The patient had subsequent imaging to monitor this lesion. She had a PET scan on June 28, 2018 which noted mild hypermetabolism with an SUV of 2.77 and this lesion. The blood pool SUV max was 2.23.  This lesion measured 12 mm at that time.  No lymphadenopathy was appreciated on the PET scan. The patient then had a repeat CT scan on 10/18/2018 which noted that the lesion was enlarging, measuring 15 x 13 mm.  There was no lymphadenopathy present.  She is not a surgical candidate so she underwent SBRT under the care of Dr. Lisbeth Renshaw. This was completed in January 2020.   The patient's recent CT imaging studies from 07/31/20 CT showed masslike fibrosis in the right lower lobe, likely grossly unchanged. Radiology noted that residual viable tumor is difficult to exclude on CT. She also had a 10 mm short axis intralobar right lower lobe node, unchanged but considered suspicious. She also had a 5 mm short axis right hilar node, within normal limits.    The patient then had a PET scan performed on 08/10/2020 to further characterize this which showed hypermetabolic 1.1 cm right infrahilar lymph node, new since 2019 PET-CT, suspicious for recurrent metastatic nodal disease. There was no additional sites suspicious for hypermetabolic metastatic disease.   The patient was then evaluated by  pulmonology in September 2021 who felt that a bronchoscopy would be too risky considering how frail the patient is. The case was discussed at the multidisciplinary thoracic conference.  Of note, the patient was clear that she was not interested in chemotherapy.  Due to her age, wishes, performance status, and fragility, it was determined that the patient would not be a good candidate for concurrent chemoradiation.  Therefore, it was favored to treat this site putatively with stereotactic style radiotherapy. Dr. Lisbeth Renshaw from radiation oncology recommended that because of the central location this be offered treatment in 8-10 fractions. Her last dose is scheduled for 10/26/20.   Overall, the patient is feeling well today.  She reports occasional fatigue.  She estimates that she would feel tired if she walked from one end to the house to the other.  She denies any significant chest pain or cough.  Reports her usual baseline dyspnea on exertion.  Denies any nausea, vomiting, or diarrhea.  She reports her baseline constipation for which she will take laxatives if needed.  She denies any headache or visual changes except for the cataracts in her left eye.  The patient lives at home independently and uses a cane for ambulation.  She has a walker if needed.  The patient's family history consists of a mother who passed away in 77 secondary to cancer.  The patient is unsure of the primary site of her malignancy.  The patient's father had rheumatoid arthritis.  The patient used to work at an [employer].  She is widowed and has 1 child.  She denies  any drug or alcohol use.  The patient estimates that she smoked approximately 20 years averaging half a pack of cigarettes per day.  The patient estimates that she quit smoking 15 to 16 years ago.  HPI  Past Medical History:  Diagnosis Date  . ANXIETY 07/23/2009  . Atherosclerosis of aorta ([place]) 07/24/2015  . Complication of anesthesia 7 or 8 yrs ago   woke up  during colonscopy  . COPD (chronic obstructive pulmonary disease) ([place])   . Coronary atherosclerosis 07/24/2015  . DEPRESSION 07/23/2009  . DIVERTICULOSIS, COLON 07/23/2009  . GERD 07/23/2009  . Headache(784.0) 07/23/2009   occasional  . Hemorrhoids   . HIP PAIN, LEFT 05/16/2010  . History of Crohn's disease   . HYPERLIPIDEMIA 07/23/2009  . HYPERTENSION 07/23/2009  . HYPOTHYROIDISM 07/23/2009  . IBS (irritable bowel syndrome)   . Insomnia   . LOW BACK PAIN, CHRONIC 10/01/2009  . lung ca dx'd 2019   SBRT comp 12/2018  . PARESTHESIA 10/01/2009  . Shingles 2006   back    Past Surgical History:  Procedure Laterality Date  . ABDOMINAL HYSTERECTOMY  age 56 or 68  . APPENDECTOMY  yrs ago  . BILATERAL SALPINGOOPHORECTOMY  age 89 or 40  . COLONOSCOPY N/A 11/17/2013   Procedure: COLONOSCOPY;  Surgeon: Cleotis Nipper, MD;  Location: [facility];  Service: Endoscopy;  Laterality: N/A;  . ESOPHAGOGASTRODUODENOSCOPY N/A 11/17/2013   Procedure: ESOPHAGOGASTRODUODENOSCOPY (EGD);  Surgeon: Cleotis Nipper, MD;  Location: [facility];  Service: Endoscopy;  Laterality: N/A;  . HEMORRHOID SURGERY  yrs ago  . TONSILLECTOMY  yrs ago    Family History  Problem Relation Age of Onset  . Cancer Mother        Unknown type  . Cancer Sister        Breast  . Clotting disorder Neg Hx     Social History Social History   Tobacco Use  . Smoking status: Former Smoker    Packs/day: 0.50    Types: Cigarettes    Start date: 07/16/1960    Quit date: 07/16/1998    Years since quitting: 22.2  . Smokeless tobacco: Never Used  Vaping Use  . Vaping Use: Never used  Substance Use Topics  . Alcohol use: No    Alcohol/week: 0.0 standard drinks  . Drug use: No    Allergies  Allergen Reactions  . Tylenol [Acetaminophen] Other (See Comments)    Nightmares  . Symbicort [Budesonide-Formoterol Fumarate] Other (See Comments)    Pt felt like her tongue was swollen, could not swallow    Current Outpatient  Medications  Medication Sig Dispense Refill  . Albuterol Sulfate (PROAIR RESPICLICK) 476 (90 Base) MCG/ACT AEPB Inhale 2 puffs into the lungs 4 (four) times daily as needed. (Patient taking differently: Inhale 2 puffs into the lungs 4 (four) times daily as needed (sob and wheezing). ) 1 each 6  . amLODipine (NORVASC) 10 MG tablet TAKE (1) TABLET DAILY FOR HIGH BLOOD PRESSURE. 90 tablet 0  . benzonatate (TESSALON) 100 MG capsule TAKE 1 CAPSULE TWICE DAILY AS NEEDED FOR COUGH. 60 capsule 0  . Cholecalciferol (VITAMIN D3 PO) Take 1 tablet by mouth daily.    . clorazepate (TRANXENE) 7.5 MG tablet Take 1 tablet (7.5 mg total) by mouth 2 (two) times daily as needed for anxiety. 60 tablet 5  . doxepin (SINEQUAN) 10 MG capsule TAKE 1 TABLET AT BEDTIME AS NEEDED. 90 capsule 0  . esomeprazole (NEXIUM) 40 MG capsule Take 1 capsule (40  mg total) by mouth daily. 90 capsule 3  . fluticasone furoate-vilanterol (BREO ELLIPTA) 200-25 MCG/INH AEPB INHALE 1 PUFF INTO THE LUNGS ONCE DAILY. 161 each 0  . folic acid (FOLVITE) 1 MG tablet TAKE 1 TABLET ONCE DAILY. 90 tablet 3  . furosemide (LASIX) 20 MG tablet TAKE 1 TABLET DAILY IF NEEDED FOR SIGNIFICANT LOWER EXTREMITY SWELLING. 90 tablet 0  . gabapentin (NEURONTIN) 100 MG capsule TAKE (1) CAPSULE THREE TIMES DAILY. 90 capsule 0  . levothyroxine (SYNTHROID) 25 MCG tablet TAKE 1 TABLET DAILY BEFORE BREAKFAST. 90 tablet 2  . losartan (COZAAR) 25 MG tablet TAKE (1) TABLET DAILY FOR HIGH BLOOD PRESSURE. 90 tablet 0  . MYRBETRIQ 25 MG TB24 tablet TAKE 1 TABLET ONCE DAILY. 30 tablet 3  . oxybutynin (DITROPAN-XL) 5 MG 24 hr tablet TAKE 1 TABLET IN THE MORNING FOR BLADDER CONTROL. 90 tablet 0  . potassium chloride SA (KLOR-CON) 20 MEQ tablet TAKE 1 TABLET ONCE DAILY. 90 tablet 0  . pregabalin (LYRICA) 25 MG capsule TAKE (1) CAPSULE TWICE DAILY. 60 capsule 0  . promethazine (PHENERGAN) 25 MG tablet TAKE 1 TABLET EVERY 8 HOURS AS NEEDED FOR NAUSEA & VOMITING. 30 tablet 0  .  senna-docusate (SENOKOT-S) 8.6-50 MG tablet Take 2 tablets by mouth at bedtime. (Patient taking differently: Take 2 tablets by mouth at bedtime as needed (constipation). ) 60 tablet 2  . sertraline (ZOLOFT) 50 MG tablet TAKE 1 TABLET EACH DAY. 90 tablet 3  . traMADol (ULTRAM) 50 MG tablet Take 1 tablet (50 mg total) by mouth in the morning, at noon, and at bedtime. 90 tablet 5  . VITAMIN D-1000 MAX ST 25 MCG (1000 UT) tablet Take 1,000 Units by mouth daily.     No current facility-administered medications for this visit.     REVIEW OF SYSTEMS:   Review of Systems  Constitutional: Positive for occasional fatigue. Negative for appetite change, chills, fever and unexpected weight change.  HENT: Negative for mouth sores, nosebleeds, sore throat and trouble swallowing.   Eyes: Negative for eye problems and icterus.  Respiratory: Positive for baseline dyspnea on exertion. Negative for cough, hemoptysis, and wheezing.   Cardiovascular: Negative for chest pain and leg swelling.  Gastrointestinal: Positive for occasional obstipation. egative for abdominal pain, diarrhea, nausea and vomiting.  Genitourinary: Negative for bladder incontinence, difficulty urinating, dysuria, frequency and hematuria.   Musculoskeletal: Negative for back pain, gait problem, neck pain and neck stiffness.  Skin: Negative for itching and rash.  Neurological: Negative for dizziness, extremity weakness, gait problem, headaches, light-headedness and seizures.  Hematological: Negative for adenopathy. Does not bruise/bleed easily.  Psychiatric/Behavioral: Negative for confusion, depression and sleep disturbance. The patient is not nervous/anxious.     PHYSICAL EXAMINATION:  There were no vitals taken for this visit.  ECOG PERFORMANCE STATUS: 2  Physical Exam  Constitutional: Oriented to person, place, and time and thin elderly appearing female and in no distress.  HENT:  Head: Normocephalic and atraumatic.  Mouth/Throat:  Oropharynx is clear and moist. No oropharyngeal exudate.  Eyes: Conjunctivae are normal. Right eye exhibits no discharge. Left eye exhibits no discharge. No scleral icterus.  Neck: Normal range of motion. Neck supple.  Cardiovascular: Normal rate, regular rhythm, normal heart sounds and intact distal pulses.   Pulmonary/Chest: Effort normal. Quiet breath sounds in right lung. No respiratory distress. No wheezes. No rales.  Abdominal: Soft. Bowel sounds are normal. Exhibits no distension and no mass. There is no tenderness.  Musculoskeletal: Normal range of motion.  Exhibits no edema.  Lymphadenopathy:    No cervical adenopathy.  Neurological: Alert and oriented to person, place, and time. Exhibits muscle wasting. The patient was examined in the wheelchair.  Skin: Skin is warm and dry. No rash noted. Not diaphoretic. No erythema. No pallor.  Psychiatric: Mood, memory and judgment normal.  Vitals reviewed.  LABORATORY DATA: Lab Results  Component Value Date   WBC 6.2 10/10/2020   HGB 10.2 (L) 10/10/2020   HCT 34.6 (L) 10/10/2020   MCV 78.8 (L) 10/10/2020   PLT 337 10/10/2020      Chemistry      Component Value Date/Time   NA 140 10/10/2020 1023   K 3.9 10/10/2020 1023   CL 102 10/10/2020 1023   CO2 29 10/10/2020 1023   BUN 13 10/10/2020 1023   CREATININE 0.91 10/10/2020 1023      Component Value Date/Time   CALCIUM 9.7 10/10/2020 1023   ALKPHOS 86 10/10/2020 1023   AST 15 10/10/2020 1023   ALT 9 10/10/2020 1023   BILITOT 0.5 10/10/2020 1023       RADIOGRAPHIC STUDIES: No results found.  ASSESSMENT: This is a very pleasant 85 year old Caucasian female initially diagnosed with stage Ia presumed malignant neoplasm of the right lower lobe which was diagnosed in 2019.  She is status post SBRT to this lesion in January 2020 under the care of Dr. Lisbeth Renshaw.  The patient had evidence of disease recurrence in August 2021 with a new hypermetabolic right hilar lymph node. The plan is to  complete SBRT to this lesion in November 2021 under the care of Dr. Lisbeth Renshaw.    PLAN: The patient was seen with Dr. Julien Nordmann today.  Dr. Julien Nordmann had a lengthy discussion with the patient about her current condition and recommended treatment options. Dr. Julien Nordmann discussed that he cannot offer chemotherapy without a biopsy. The patient was evaluated by pulmonology who felt that given the patient is too risky for biopsy given that she is quite frail. Dr. Julien Nordmann believes that given her age and general condition, that chemotherapy is prohibitive for this patient's single lesion. Additionally, given the location of the lesion, which is located near her heart/central, Dr. Julien Nordmann would not recommend chemoRT. Dr. Julien Nordmann recommends that the patient undergo SBRT to this solitary lesion in the lung under the care of Dr. Lisbeth Renshaw as planned.  The patient is in agreement with the plan.  We will not arrange for any follow-up appointments at this time, the patient will continue to follow with radiation oncology for consideration of SBRT to this nodule.  The patient voices understanding of current disease status and treatment options and is in agreement with the current care plan.  All questions were answered. The patient knows to call the clinic with any problems, questions or concerns. We can certainly see the patient much sooner if necessary.  Thank you so much for allowing me to participate in the care of Heather [name]. I will continue to follow up the patient with you and assist in her care.  The total time spent in the appointment was 60 minutes.  Disclaimer: This note was dictated with voice recognition software. Similar sounding words can inadvertently be transcribed and may not be corrected upon review.   Esmirna Ravan L Danyell Shader October 10, 2020, 11:54 AM  ADDENDUM: Hematology/Oncology Attending: I had a face-to-face encounter with the patient today.  I recommended her care plan.  This is a  very pleasant 85 years old with multiple comorbidities including COPD, coronary  artery disease, hypothyroidism, GERD, hypertension, diverticulosis and small bowel obstruction as well as previous history of smoking.  The patient was found in February 2019 to have 1.0 cm nodule in the right lower lobe she had a PET scan at that time that showed mild hypermetabolic activity of the lesion but it was increasing in size on follow-up imaging studies.  The patient was not great candidate for surgical resection or biopsy.  She was treated empirically with SBRT under the care of Dr. Lisbeth Renshaw in January 2020.  She continues to have close monitoring with imaging studies and CT scan of the chest on July 31, 2020 followed by a PET scan on 08/10/2020 showed a hypermetabolic 1.1 cm right infrahilar lesion/lymph node new since the previous PET scan and suspicious for recurrent metastatic nodal disease. The patient is a still high risk for resection or biopsy based on her evaluation with pulmonary medicine. She is expected to receive SBRT to this lesion.  She was referred to me today for evaluation and consideration of concurrent chemotherapy if needed. When the patient seen today she continues to complain of increasing fatigue and weakness as well as shortness of breath. I had a lengthy discussion with the patient and her son [name] who was available in the room. The patient has no tissue diagnosis of lung cancer at this point and it will be hard for me to consider her for concurrent chemotherapy without a tissue diagnosis.  She may not be able to tolerate the treatment and she is very reluctant to consider chemotherapy. I strongly recommend for the patient to receive empirical treatment with curative radiotherapy to this lesion as prescribed by Dr. Lisbeth Renshaw. I will see the patient on as-needed basis at this point but will be happy to evaluate her in the future if needed. The patient was advised to call immediately if she has any  concerning symptoms in the interval.  Disclaimer: This note was dictated with voice recognition software. Similar sounding words can inadvertently be transcribed and may be missed upon review. Eilleen Kempf, MD 10/10/20

## 2020-10-08 NOTE — Progress Notes (Signed)
I received referral on Ms. Lindler today.  I updated Dr. Julien Nordmann and Rubin Payor. Patient can be seen on 10/10/20.  I notified scheduling to call and schedule her.

## 2020-10-09 ENCOUNTER — Ambulatory Visit: Payer: Medicare Other | Admitting: Radiation Oncology

## 2020-10-09 ENCOUNTER — Telehealth: Payer: Self-pay | Admitting: *Deleted

## 2020-10-09 NOTE — Telephone Encounter (Signed)
I received a message from scheduling that patient is confused about her appt tomorrow with Cassie.  I called patient's son to clarify.  He states he will talk with his mom about the appt. He will call back if they can not make the appt.

## 2020-10-10 ENCOUNTER — Ambulatory Visit: Payer: Medicare Other | Admitting: Radiation Oncology

## 2020-10-10 ENCOUNTER — Inpatient Hospital Stay: Payer: Medicare Other

## 2020-10-10 ENCOUNTER — Other Ambulatory Visit: Payer: Self-pay | Admitting: Physician Assistant

## 2020-10-10 ENCOUNTER — Inpatient Hospital Stay: Payer: Medicare Other | Attending: Physician Assistant | Admitting: Physician Assistant

## 2020-10-10 ENCOUNTER — Encounter: Payer: Self-pay | Admitting: Physician Assistant

## 2020-10-10 ENCOUNTER — Other Ambulatory Visit: Payer: Self-pay

## 2020-10-10 DIAGNOSIS — Z923 Personal history of irradiation: Secondary | ICD-10-CM | POA: Insufficient documentation

## 2020-10-10 DIAGNOSIS — Z79899 Other long term (current) drug therapy: Secondary | ICD-10-CM | POA: Insufficient documentation

## 2020-10-10 DIAGNOSIS — R918 Other nonspecific abnormal finding of lung field: Secondary | ICD-10-CM

## 2020-10-10 DIAGNOSIS — Z7189 Other specified counseling: Secondary | ICD-10-CM

## 2020-10-10 DIAGNOSIS — C3431 Malignant neoplasm of lower lobe, right bronchus or lung: Secondary | ICD-10-CM | POA: Diagnosis not present

## 2020-10-10 DIAGNOSIS — R531 Weakness: Secondary | ICD-10-CM | POA: Diagnosis not present

## 2020-10-10 DIAGNOSIS — I2510 Atherosclerotic heart disease of native coronary artery without angina pectoris: Secondary | ICD-10-CM | POA: Diagnosis not present

## 2020-10-10 LAB — CBC WITH DIFFERENTIAL (CANCER CENTER ONLY)
Abs Immature Granulocytes: 0.03 10*3/uL (ref 0.00–0.07)
Basophils Absolute: 0.1 10*3/uL (ref 0.0–0.1)
Basophils Relative: 1 %
Eosinophils Absolute: 0.2 10*3/uL (ref 0.0–0.5)
Eosinophils Relative: 3 %
HCT: 34.6 % — ABNORMAL LOW (ref 36.0–46.0)
Hemoglobin: 10.2 g/dL — ABNORMAL LOW (ref 12.0–15.0)
Immature Granulocytes: 1 %
Lymphocytes Relative: 32 %
Lymphs Abs: 2 10*3/uL (ref 0.7–4.0)
MCH: 23.2 pg — ABNORMAL LOW (ref 26.0–34.0)
MCHC: 29.5 g/dL — ABNORMAL LOW (ref 30.0–36.0)
MCV: 78.8 fL — ABNORMAL LOW (ref 80.0–100.0)
Monocytes Absolute: 0.6 10*3/uL (ref 0.1–1.0)
Monocytes Relative: 10 %
Neutro Abs: 3.3 10*3/uL (ref 1.7–7.7)
Neutrophils Relative %: 53 %
Platelet Count: 337 10*3/uL (ref 150–400)
RBC: 4.39 MIL/uL (ref 3.87–5.11)
RDW: 18.5 % — ABNORMAL HIGH (ref 11.5–15.5)
WBC Count: 6.2 10*3/uL (ref 4.0–10.5)
nRBC: 0 % (ref 0.0–0.2)

## 2020-10-10 LAB — CMP (CANCER CENTER ONLY)
ALT: 9 U/L (ref 0–44)
AST: 15 U/L (ref 15–41)
Albumin: 4.2 g/dL (ref 3.5–5.0)
Alkaline Phosphatase: 86 U/L (ref 38–126)
Anion gap: 9 (ref 5–15)
BUN: 13 mg/dL (ref 8–23)
CO2: 29 mmol/L (ref 22–32)
Calcium: 9.7 mg/dL (ref 8.9–10.3)
Chloride: 102 mmol/L (ref 98–111)
Creatinine: 0.91 mg/dL (ref 0.44–1.00)
GFR, Estimated: >60 mL/min (ref >60)
Glucose, Bld: 93 mg/dL (ref 70–99)
Potassium: 3.9 mmol/L (ref 3.5–5.1)
Sodium: 140 mmol/L (ref 135–145)
Total Bilirubin: 0.5 mg/dL (ref 0.3–1.2)
Total Protein: 7.8 g/dL (ref 6.5–8.1)

## 2020-10-11 ENCOUNTER — Ambulatory Visit: Payer: Medicare Other | Admitting: Radiation Oncology

## 2020-10-11 DIAGNOSIS — C3431 Malignant neoplasm of lower lobe, right bronchus or lung: Secondary | ICD-10-CM | POA: Insufficient documentation

## 2020-10-12 ENCOUNTER — Ambulatory Visit: Payer: Medicare Other | Admitting: Radiation Oncology

## 2020-10-12 DIAGNOSIS — C3431 Malignant neoplasm of lower lobe, right bronchus or lung: Secondary | ICD-10-CM | POA: Diagnosis not present

## 2020-10-15 ENCOUNTER — Ambulatory Visit
Admission: RE | Admit: 2020-10-15 | Discharge: 2020-10-15 | Disposition: A | Discharge status: Home / Self Care | Payer: Medicare Other | Source: Ambulatory Visit | Attending: Radiation Oncology | Admitting: Radiation Oncology

## 2020-10-15 ENCOUNTER — Other Ambulatory Visit: Payer: Self-pay

## 2020-10-15 DIAGNOSIS — C3431 Malignant neoplasm of lower lobe, right bronchus or lung: Secondary | ICD-10-CM | POA: Diagnosis not present

## 2020-10-16 ENCOUNTER — Ambulatory Visit: Payer: Medicare Other | Admitting: Radiation Oncology

## 2020-10-16 ENCOUNTER — Other Ambulatory Visit: Payer: Self-pay

## 2020-10-16 ENCOUNTER — Ambulatory Visit
Admission: RE | Admit: 2020-10-16 | Discharge: 2020-10-16 | Disposition: A | Discharge status: Home / Self Care | Payer: Medicare Other | Source: Ambulatory Visit | Attending: Radiation Oncology | Admitting: Radiation Oncology

## 2020-10-16 DIAGNOSIS — C3431 Malignant neoplasm of lower lobe, right bronchus or lung: Secondary | ICD-10-CM | POA: Diagnosis not present

## 2020-10-17 ENCOUNTER — Ambulatory Visit
Admission: RE | Admit: 2020-10-17 | Discharge: 2020-10-17 | Disposition: A | Discharge status: Home / Self Care | Payer: Medicare Other | Source: Ambulatory Visit | Attending: Radiation Oncology | Admitting: Radiation Oncology

## 2020-10-17 DIAGNOSIS — C3431 Malignant neoplasm of lower lobe, right bronchus or lung: Secondary | ICD-10-CM | POA: Diagnosis not present

## 2020-10-18 ENCOUNTER — Ambulatory Visit: Payer: Medicare Other | Admitting: Radiation Oncology

## 2020-10-18 ENCOUNTER — Ambulatory Visit
Admission: RE | Admit: 2020-10-18 | Discharge: 2020-10-18 | Disposition: A | Discharge status: Home / Self Care | Payer: Medicare Other | Source: Ambulatory Visit | Attending: Radiation Oncology | Admitting: Radiation Oncology

## 2020-10-18 DIAGNOSIS — C3431 Malignant neoplasm of lower lobe, right bronchus or lung: Secondary | ICD-10-CM | POA: Diagnosis not present

## 2020-10-19 ENCOUNTER — Ambulatory Visit
Admission: RE | Admit: 2020-10-19 | Discharge: 2020-10-19 | Disposition: A | Discharge status: Home / Self Care | Payer: Medicare Other | Source: Ambulatory Visit | Attending: Radiation Oncology | Admitting: Radiation Oncology

## 2020-10-19 DIAGNOSIS — C3431 Malignant neoplasm of lower lobe, right bronchus or lung: Secondary | ICD-10-CM | POA: Diagnosis not present

## 2020-10-22 ENCOUNTER — Ambulatory Visit
Admission: RE | Admit: 2020-10-22 | Discharge: 2020-10-22 | Disposition: A | Discharge status: Home / Self Care | Payer: Medicare Other | Source: Ambulatory Visit | Attending: Radiation Oncology | Admitting: Radiation Oncology

## 2020-10-22 DIAGNOSIS — C3431 Malignant neoplasm of lower lobe, right bronchus or lung: Secondary | ICD-10-CM | POA: Diagnosis not present

## 2020-10-23 ENCOUNTER — Ambulatory Visit
Admission: RE | Admit: 2020-10-23 | Discharge: 2020-10-23 | Disposition: A | Discharge status: Home / Self Care | Payer: Medicare Other | Source: Ambulatory Visit | Attending: Radiation Oncology | Admitting: Radiation Oncology

## 2020-10-23 DIAGNOSIS — C3431 Malignant neoplasm of lower lobe, right bronchus or lung: Secondary | ICD-10-CM | POA: Diagnosis not present

## 2020-10-24 ENCOUNTER — Ambulatory Visit
Admission: RE | Admit: 2020-10-24 | Discharge: 2020-10-24 | Disposition: A | Discharge status: Home / Self Care | Payer: Medicare Other | Source: Ambulatory Visit | Attending: Radiation Oncology | Admitting: Radiation Oncology

## 2020-10-24 ENCOUNTER — Other Ambulatory Visit: Payer: Self-pay | Admitting: Physical Medicine and Rehabilitation

## 2020-10-24 ENCOUNTER — Other Ambulatory Visit: Payer: Self-pay | Admitting: Internal Medicine

## 2020-10-24 DIAGNOSIS — B0229 Other postherpetic nervous system involvement: Secondary | ICD-10-CM

## 2020-10-24 DIAGNOSIS — C3431 Malignant neoplasm of lower lobe, right bronchus or lung: Secondary | ICD-10-CM | POA: Diagnosis not present

## 2020-10-25 ENCOUNTER — Ambulatory Visit
Admission: RE | Admit: 2020-10-25 | Discharge: 2020-10-25 | Disposition: A | Discharge status: Home / Self Care | Payer: Medicare Other | Source: Ambulatory Visit | Attending: Radiation Oncology | Admitting: Radiation Oncology

## 2020-10-25 DIAGNOSIS — C3431 Malignant neoplasm of lower lobe, right bronchus or lung: Secondary | ICD-10-CM | POA: Diagnosis not present

## 2020-10-26 ENCOUNTER — Encounter: Payer: Self-pay | Admitting: Radiation Oncology

## 2020-10-26 ENCOUNTER — Ambulatory Visit
Admission: RE | Admit: 2020-10-26 | Discharge: 2020-10-26 | Disposition: A | Discharge status: Home / Self Care | Payer: Medicare Other | Source: Ambulatory Visit | Attending: Radiation Oncology | Admitting: Radiation Oncology

## 2020-10-26 ENCOUNTER — Other Ambulatory Visit: Payer: Self-pay

## 2020-10-26 DIAGNOSIS — C3431 Malignant neoplasm of lower lobe, right bronchus or lung: Secondary | ICD-10-CM | POA: Diagnosis not present

## 2020-11-12 ENCOUNTER — Other Ambulatory Visit: Payer: Self-pay | Admitting: Family

## 2020-11-19 ENCOUNTER — Telehealth: Payer: Self-pay | Admitting: Radiation Oncology

## 2020-11-19 DIAGNOSIS — C3431 Malignant neoplasm of lower lobe, right bronchus or lung: Secondary | ICD-10-CM

## 2020-11-19 NOTE — Telephone Encounter (Signed)
  Radiation Oncology         [phone] ________________________________  Name: Heather [name] MRN: [id number]  Date of Service: 11/19/2020  DOB: 12-12-1934  Post Treatment Telephone Note  Diagnosis:  Putative Stage IA2, cT1bN0M0, NSCLC of the right lower lobe with separate Putative Stage IA1 cT1aN0M0 NSCLC of the RLL/hilar region.  Interval Since Last Radiation:  4 weeks   10/15/20-10/26/20 SBRT Treatment: The RLL Target was treated to 60 Gy in 10 fractions  12/15/18-12/22/2018 SBRT Treatment: The RLL Target was treated to 54 Gy in 3 fractions  Narrative:  The patient was contacted today for routine follow-up. During treatment she did very well with radiotherapy and did not have significant desquamation. She reports she had a terrible deep hurting in her chest that lasted about 3-4 days about 1 week after treatment. Her voice was shaky from this but she feels this has improved. She reports she felt an impending sense of doom, but that in the last two weeks this has really improved overall. She does have a sore on her lower extremity that she is caring for with ointment and bandages.   Impression/Plan: 1. Putative Stage IA1 cT1aN0M0 NSCLC of the RLL/hilar region. The patient has been doing well since completion of radiotherapy. We discussed that we would plan to proceed with r repeat CT in about 2-4 weeks, she requests more like 4-6 weeks which as long as she's felling well I think is reasonable. She is in agreement with this plan. 2. Putative Stage IA2, cT1bN0M0, NSCLC of the right lower lobe. This will be followed along with the most recent area treated by CT imaging.  3. Lower extremity sore. The patient does not have a dermatologist but will try some OTC remedies including antibiotic ointment. If this does not improve by the time we discuss her CT scan, she will be referred to dermatology, or she can also discuss with her PCP.    Carola Rhine, PAC

## 2020-11-20 ENCOUNTER — Telehealth: Payer: Self-pay | Admitting: *Deleted

## 2020-11-20 NOTE — Telephone Encounter (Signed)
CALLED PATIENT'S SON - STEVEN TO SEE IF HE IS OK WITH TELEPHONE FU ON 12-10-20 @ 1 PM, LVM FOR A RETURN CALL

## 2020-11-28 ENCOUNTER — Telehealth: Payer: Self-pay | Admitting: *Deleted

## 2020-11-28 NOTE — Telephone Encounter (Signed)
CALLED PATIENT'S SON STEVE STRICKLAND TO ASK ABOUT MOVING TELEPHONE FU FROM 12-10-20 TO 12-11-20 @ 11 AM, SPOKE WITH PATIENT'S SON - STEVE AND HE AGREED TO THIS

## 2020-11-29 ENCOUNTER — Telehealth: Payer: Self-pay | Admitting: *Deleted

## 2020-11-29 NOTE — Telephone Encounter (Signed)
CALLED PATIENT'S SON- STEVE STRICKLAND TO ASK ABOUT BRINGING HIS MOM FOR STAT LABS ON 12-03-20 @ 2:30 PM @ [facility], PATIENT'S SON AGREED TO DO SO

## 2020-12-03 ENCOUNTER — Other Ambulatory Visit: Payer: Self-pay

## 2020-12-03 ENCOUNTER — Ambulatory Visit
Admission: RE | Admit: 2020-12-03 | Discharge: 2020-12-03 | Disposition: A | Discharge status: Home / Self Care | Payer: Medicare Other | Source: Ambulatory Visit | Attending: Radiation Oncology | Admitting: Radiation Oncology

## 2020-12-03 ENCOUNTER — Ambulatory Visit (HOSPITAL_COMMUNITY)
Admission: RE | Admit: 2020-12-03 | Discharge: 2020-12-03 | Disposition: A | Discharge status: Home / Self Care | Payer: Medicare Other | Source: Ambulatory Visit | Attending: Radiation Oncology | Admitting: Radiation Oncology

## 2020-12-03 DIAGNOSIS — J439 Emphysema, unspecified: Secondary | ICD-10-CM | POA: Diagnosis not present

## 2020-12-03 DIAGNOSIS — Z85118 Personal history of other malignant neoplasm of bronchus and lung: Secondary | ICD-10-CM | POA: Diagnosis not present

## 2020-12-03 DIAGNOSIS — C3431 Malignant neoplasm of lower lobe, right bronchus or lung: Secondary | ICD-10-CM | POA: Diagnosis not present

## 2020-12-03 DIAGNOSIS — I700 Atherosclerosis of aorta: Secondary | ICD-10-CM | POA: Diagnosis not present

## 2020-12-03 LAB — BUN & CREATININE (CHCC)
BUN: 15 mg/dL (ref 8–23)
Creatinine: 0.87 mg/dL (ref 0.44–1.00)
GFR, Estimated: >60 mL/min (ref >60)

## 2020-12-03 MED ORDER — IOHEXOL 300 MG/ML  SOLN
75.0000 mL | Freq: Once | INTRAMUSCULAR | Status: AC | PRN
  Administered 2020-12-03: 75 mL via INTRAVENOUS

## 2020-12-05 ENCOUNTER — Telehealth: Payer: Self-pay

## 2020-12-05 ENCOUNTER — Other Ambulatory Visit: Payer: Self-pay | Admitting: Family

## 2020-12-05 ENCOUNTER — Encounter: Payer: Self-pay | Admitting: Radiation Oncology

## 2020-12-05 ENCOUNTER — Other Ambulatory Visit: Payer: Self-pay

## 2020-12-05 NOTE — Telephone Encounter (Signed)
Spoke with patient in regards to telephone visit with Shona Simpson PA on 12/11/20 @ 11:00am. Patient verbalized understanding of appointment date and time. Reviewed meaningful use questions.TM

## 2020-12-10 ENCOUNTER — Ambulatory Visit: Payer: Self-pay | Admitting: Radiation Oncology

## 2020-12-11 ENCOUNTER — Other Ambulatory Visit: Payer: Self-pay

## 2020-12-11 ENCOUNTER — Ambulatory Visit
Admission: RE | Admit: 2020-12-11 | Discharge: 2020-12-11 | Disposition: A | Discharge status: Home / Self Care | Payer: Medicare Other | Source: Ambulatory Visit | Attending: Radiation Oncology | Admitting: Radiation Oncology

## 2020-12-11 DIAGNOSIS — C3431 Malignant neoplasm of lower lobe, right bronchus or lung: Secondary | ICD-10-CM

## 2020-12-11 NOTE — Progress Notes (Signed)
  Radiation Oncology         [phone] ________________________________  Name: Heather [name] MRN: [id number]  Date of Service: 12/11/2020  DOB: 10-31-1935  Post Treatment Telephone Note  Diagnosis:  Putative Stage IA2, cT1bN0M0, NSCLC of the right lower lobe with separate Putative Stage IA1 cT1aN0M0 NSCLC of the RLL/hilar region.  Interval Since Last Radiation:  6 weeks  10/15/20-10/26/20 SBRT Treatment: The RLL Target was treated to 60 Gy in 10 fractions  12/15/18-12/22/2018 SBRT Treatment: The RLL Target was treated to 54 Gy in 3 fractions  Narrative:  The patient was contacted today to discuss the results of her recent post treatment CT scan of the chest on 12/03/20 that showed improvement in the right hilum now measuring 5 mm compared to 1 cm previously, again the right lower lobe target above the right hemidiaphragm measured 3.4 cm which was improved from 4.7 cm.  She is contacted today to review these results.  Of note the last time I spoke with her she was experiencing right lower extremity ulceration that she was treating with over-the-counter first-aid approach including Neosporin and bandages.  She states that today the area is a bit smaller than it had been but she has noticed that even being in the shower causes it to bleed.  She denies any other new ulcerations.   Impression/Plan: 1. Putative Stage IA1 cT1aN0M0 NSCLC of the RLL/hilar region. The patient has been doing well since completion of radiotherapy.  Fortunately her scans were reassuring, I reviewed them with Dr. Lisbeth Renshaw and he is recommended that we repeat them in 6 months time.  The patient is in agreement with this plan. 2. Putative Stage IA2, cT1bN0M0, NSCLC of the right lower lobe.  Again this was reassuring based on her scan to follow the most recent treatment site.  We will plan her next scan in 6 months time.  She is in agreement with this. 3. Right lower extremity sore. The patient does not have a dermatologist  and was offered referral versus talking with her PCP further.  I think that she should have this further evaluated and will copy her PCP as well.     Carola Rhine, PAC

## 2020-12-25 ENCOUNTER — Other Ambulatory Visit: Payer: Self-pay | Admitting: Family

## 2021-01-03 NOTE — Progress Notes (Signed)
  Radiation Oncology         [phone] ________________________________  Name: [name] MRN: [id number]  Date: 10/26/2020  DOB: [birth year]  End of Treatment Note  Diagnosis:   NSCLC of the right lung     Indication for treatment::  curative       Radiation treatment dates:   10/15/20 - 10/26/20  Site/dose:   The patient was treated to the right lung with a course of stereotactic body radiation treatment.  The patient received 60 Gray in 10 fractions using a IMRT technique, with 3 fields.  Narrative: The patient tolerated radiation treatment relatively well.   No unexpected difficulties.  The patient's breathing did not significantly change during the course of the treatment.  Plan: The patient has completed radiation treatment. The patient will return to radiation oncology clinic for routine followup in one month. I advised the patient to call or return sooner if they have any questions or concerns related to their recovery or treatment. ________________________________  Jodelle Gross, M.D., Ph.D.

## 2021-01-28 ENCOUNTER — Ambulatory Visit: Payer: Medicare Other | Admitting: Physical Medicine and Rehabilitation

## 2021-01-30 ENCOUNTER — Other Ambulatory Visit: Payer: Self-pay | Admitting: Physical Medicine and Rehabilitation

## 2021-01-30 ENCOUNTER — Encounter
Payer: Medicare Other | Attending: Physical Medicine and Rehabilitation | Admitting: Physical Medicine and Rehabilitation

## 2021-01-30 ENCOUNTER — Other Ambulatory Visit: Payer: Self-pay

## 2021-01-30 ENCOUNTER — Encounter: Payer: Self-pay | Admitting: Physical Medicine and Rehabilitation

## 2021-01-30 ENCOUNTER — Other Ambulatory Visit: Payer: Self-pay | Admitting: Family

## 2021-01-30 VITALS — BP 127/77 | HR 107 | Temp 99.5°F

## 2021-01-30 DIAGNOSIS — Z5181 Encounter for therapeutic drug level monitoring: Secondary | ICD-10-CM | POA: Diagnosis not present

## 2021-01-30 DIAGNOSIS — C801 Malignant (primary) neoplasm, unspecified: Secondary | ICD-10-CM | POA: Diagnosis not present

## 2021-01-30 DIAGNOSIS — Z79891 Long term (current) use of opiate analgesic: Secondary | ICD-10-CM | POA: Diagnosis not present

## 2021-01-30 DIAGNOSIS — M25552 Pain in left hip: Secondary | ICD-10-CM | POA: Diagnosis not present

## 2021-01-30 DIAGNOSIS — K5903 Drug induced constipation: Secondary | ICD-10-CM | POA: Diagnosis not present

## 2021-01-30 DIAGNOSIS — M48062 Spinal stenosis, lumbar region with neurogenic claudication: Secondary | ICD-10-CM | POA: Diagnosis not present

## 2021-01-30 NOTE — Progress Notes (Signed)
Subjective:    Patient ID: [name], female    DOB: [birth year], 86 y.o.   MRN: [id number]  HPI   Mrs. Brase is an 86 year old woman who presents for follow-up of of lumbar spinal stenosis  1) Lumbar spinal stenosis:  Her pain is present in bilateral feet and has been present since January. Average and current pain are currently rated as 8/10. The pain is constant and burning. Pain is worst with ambulation and relieved with rest. She also has left hip pain. She uses a cane and a walker and needs assistance with household activities.  -She has been taking the Tramadol.   2) Left hip pain: -this hurts her all the time.   3) Constipation: -She has a bowel movement every other day -If she does not have this, she takes sennakot.   4) Cancer:  -She feels a lot of fatigue -She has scans scheduled every 6 months.   5) Emesis: -since Friday  6) Insomnia: -Never sleeps until after midnight  He mentioned that Trazodone 150mg  I had prescribed in the past for insomnia was too much for her and sedated her. He says that she is currently taking Doxepin for sleep and she has been waking up after 2 hours after taking this. He requested that if she doesn't mention her sleep, not to prescribe anything.   PDMP reviewed. She was last prescribed Lyrica 25mg  BID and Tramadol 50mg  BID on 8/02 and 7/29 respectively. Last scripts prior to that were 6 months ago. She asks whether the dosage of the Tramadol can be increased.   8/18 note with Dr. Scot Dock reviewed. During this in-office visit, she described burning pain and paresthesias in both feet. He felt her symptoms were most consistent with neuropathy. She has a history of back surgery and Dr. Scot Dock felt her pain could be referred from degenerative disc disease. ABIs were 100% on his testing.   Pain Inventory Average Pain 7 Pain Right Now 8 My pain is sharp  In the last 24 hours, has pain interfered with the following? General activity  9 Relation with others 2 Enjoyment of life 9 What TIME of day is your pain at its worst? morning  Sleep (in general) Poor  Pain is worse with: bending Pain improves with: rest and medication Relief from Meds: ?  use a cane use a walker  I need assistance with the following:  household duties and shopping  dizziness anxiety  Any changes since last visit?  no    Any changes since last visit?  no    Family History  Problem Relation Age of Onset  . Cancer Mother        Unknown type  . Cancer Sister        Breast  . Clotting disorder Neg Hx    Social History   Socioeconomic History  . Marital status: Widowed    Spouse name: Not on file  . Number of children: 1  . Years of education: Not on file  . Highest education level: Not on file  Occupational History  . Not on file  Tobacco Use  . Smoking status: Former Smoker    Packs/day: 0.50    Types: Cigarettes    Start date: 07/16/1960    Quit date: 07/16/1998    Years since quitting: 22.5  . Smokeless tobacco: Never Used  Vaping Use  . Vaping Use: Never used  Substance and Sexual Activity  . Alcohol use: No  Alcohol/week: 0.0 standard drinks  . Drug use: No  . Sexual activity: Never  Other Topics Concern  . Not on file  Social History Narrative   Widowed.  Lives alone in her own home.  Ambulates with a cane/walker when needed.   Social Determinants of Health   Financial Resource Strain: Not on file  Food Insecurity: Not on file  Transportation Needs: Not on file  Physical Activity: Not on file  Stress: Not on file  Social Connections: Not on file   Past Surgical History:  Procedure Laterality Date  . ABDOMINAL HYSTERECTOMY  age 47 or 25  . APPENDECTOMY  yrs ago  . BILATERAL SALPINGOOPHORECTOMY  age 53 or 39  . COLONOSCOPY N/A 11/17/2013   Procedure: COLONOSCOPY;  Surgeon: Cleotis Nipper, MD;  Location: [facility];  Service: Endoscopy;  Laterality: N/A;  . ESOPHAGOGASTRODUODENOSCOPY N/A 11/17/2013    Procedure: ESOPHAGOGASTRODUODENOSCOPY (EGD);  Surgeon: Cleotis Nipper, MD;  Location: [facility];  Service: Endoscopy;  Laterality: N/A;  . HEMORRHOID SURGERY  yrs ago  . TONSILLECTOMY  yrs ago   Past Medical History:  Diagnosis Date  . ANXIETY 07/23/2009  . Atherosclerosis of aorta ([place]) 07/24/2015  . Complication of anesthesia 7 or 8 yrs ago   woke up during colonscopy  . COPD (chronic obstructive pulmonary disease) ([place])   . Coronary atherosclerosis 07/24/2015  . DEPRESSION 07/23/2009  . DIVERTICULOSIS, COLON 07/23/2009  . GERD 07/23/2009  . Headache(784.0) 07/23/2009   occasional  . Hemorrhoids   . HIP PAIN, LEFT 05/16/2010  . History of Crohn's disease   . HYPERLIPIDEMIA 07/23/2009  . HYPERTENSION 07/23/2009  . HYPOTHYROIDISM 07/23/2009  . IBS (irritable bowel syndrome)   . Insomnia   . LOW BACK PAIN, CHRONIC 10/01/2009  . lung ca dx'd 2019   SBRT comp 12/2018  . PARESTHESIA 10/01/2009  . Shingles 2006   back   There were no vitals taken for this visit.  Opioid Risk Score:   Fall Risk Score:  `1  Depression screen PHQ 2/9  Depression screen [facility] 2/9 07/27/2020 11/10/2019 08/03/2018  Decreased Interest 0 3 1  Down, Depressed, Hopeless 0 3 2  PHQ - 2 Score 0 6 3  Altered sleeping - 3 1  Tired, decreased energy - 3 2  Change in appetite - 3 0  Feeling bad or failure about yourself  - 0 1  Trouble concentrating - 3 0  Moving slowly or fidgety/restless - 1 0  Suicidal thoughts - 0 0  PHQ-9 Score - 19 7  Difficult doing work/chores - Very difficult Not difficult at all  Some recent data might be hidden   Review of Systems  Constitutional: Negative.   HENT: Negative.   Eyes: Negative.   Respiratory: Negative.   Cardiovascular: Negative.   Gastrointestinal: Negative.   Endocrine: Negative.   Genitourinary: Negative.   Musculoskeletal: Positive for arthralgias and myalgias.  Allergic/Immunologic: Negative.   Neurological:       Burning type pain  Hematological:  Negative.   Psychiatric/Behavioral: The patient is nervous/anxious.   All other systems reviewed and are negative.      Objective:   Physical Exam Gen: no distress, normal appearing, frail HEENT: oral mucosa pink and moist, NCAT Cardio: Reg rate Chest: normal effort, normal rate of breathing Abd: soft, non-distended Ext: no edema Psych: pleasant, normal affect Skin: intact Neuro: Alert and oriented x3 Musculoskeletal: 5/5 strength throughout, came in in wheelchair    Assessment & Plan:  Mrs. Neaves is  an 86 year old woman who presents for follow-up of bilateral painful paraesthesias in her feet that started in January.  1) Chronic Pain Syndrome secondary to lumbar spinal stenosis -Discussed current symptoms of pain and history of pain.  -Discussed benefits of exercise in reducing pain. -Discussed following foods that may reduce pain: 1) Ginger 2) Blueberries 3) Salmon 4) Pumpkin seeds 5) dark chocolate 6) turmeric 7) tart cherries 8) virgin olive oil 9) chilli peppers 10) mint 11) red wine 12) garlic  Link to further information on diet for chronic pain: [email or web address]  -Currently her pain is 8/10 and being managed with Lyrica 25mg  BID and Tramadol 50mg  BID PRN. Last refills provided in early August/late July (PDMP reviewed). Continue Tramadol three times per day. UDS obtained today and pain contract signed.   -MRI 01/15/2010 reviewed and shows mild disc bulging and facet hypertrophy at L4-L5 with resulting mild lateral recess stenosis bilaterally and asymmetric disc bulging on the left and bilateral face hypertrophy at L5-1 which is contributing to mild left foraminal stenosis without definite nerve root encroachment.   -Left hip XR from 02/03/20 reviewed and is normal.   2) Insomnia: -Her son reports insomnia. She was too drowsy with the Trazodone in the past. She is currently on Doxepin. She  still wakes every 2 hours with this.   -She asks that I also send her Clorazepate 7.5mg  BID PRN for anxiety. She was on this dose prior to her doctor retiring and it really helped with her anxiety.    3) New-onset cancer: -answered her questions regarding the types of treatment she had -Recommended ketogenic diet -Discussed cancer associated fatigue -Discussed home therapy as an option if she would like  4) Left sided greater trochanteric bursitis: -discussed risks and benefits of steroid injection -apply blue emu oil 20 minutes spent in discussing insomnia with son, pain and anxiety with Mrs. Qin.   5) opioid induced constipation: -take senna nightly -discussed goal of daily BM

## 2021-01-30 NOTE — Patient Instructions (Addendum)
Blue emu oil  -Discussed following foods that may reduce pain: 1) Ginger 2) Blueberries 3) Salmon 4) Pumpkin seeds 5) dark chocolate 6) turmeric 7) tart cherries 8) virgin olive oil 9) chilli peppers 10) mint 11) red wine 12) garlic  Link to further information on diet for chronic pain: [email or web address]

## 2021-02-02 LAB — DRUG TOX MONITOR 1 W/CONF, ORAL FLD
Alprazolam: NEGATIVE ng/mL (ref <0.50)
Amphetamines: NEGATIVE ng/mL (ref <10)
Barbiturates: NEGATIVE ng/mL (ref <10)
Benzodiazepines: POSITIVE ng/mL — AB (ref <0.50)
Buprenorphine: NEGATIVE ng/mL (ref <0.10)
Chlordiazepoxide: NEGATIVE ng/mL (ref <0.50)
Clonazepam: NEGATIVE ng/mL (ref <0.50)
Cocaine: NEGATIVE ng/mL (ref <5.0)
Diazepam: NEGATIVE ng/mL (ref <0.50)
Fentanyl: NEGATIVE ng/mL (ref <0.10)
Flunitrazepam: NEGATIVE ng/mL (ref <0.50)
Flurazepam: NEGATIVE ng/mL (ref <0.50)
Heroin Metabolite: NEGATIVE ng/mL (ref <1.0)
Lorazepam: NEGATIVE ng/mL (ref <0.50)
MARIJUANA: NEGATIVE ng/mL (ref <2.5)
MDMA: NEGATIVE ng/mL (ref <10)
Meprobamate: NEGATIVE ng/mL (ref <2.5)
Methadone: NEGATIVE ng/mL (ref <5.0)
Midazolam: NEGATIVE ng/mL (ref <0.50)
Nicotine Metabolite: NEGATIVE ng/mL (ref <5.0)
Nordiazepam: 8.05 ng/mL — ABNORMAL HIGH (ref <0.50)
Opiates: NEGATIVE ng/mL (ref <2.5)
Oxazepam: NEGATIVE ng/mL (ref <0.50)
Phencyclidine: NEGATIVE ng/mL (ref <10)
Tapentadol: NEGATIVE ng/mL (ref <5.0)
Temazepam: NEGATIVE ng/mL (ref <0.50)
Tramadol: >500 ng/mL — ABNORMAL HIGH (ref <5.0)
Tramadol: POSITIVE ng/mL — AB (ref <5.0)
Triazolam: NEGATIVE ng/mL (ref <0.50)
Zolpidem: NEGATIVE ng/mL (ref <5.0)

## 2021-02-02 LAB — DRUG TOX ALC METAB W/CON, ORAL FLD: Alcohol Metabolite: NEGATIVE ng/mL (ref <25)

## 2021-02-05 ENCOUNTER — Telehealth: Payer: Self-pay | Admitting: *Deleted

## 2021-02-05 NOTE — Telephone Encounter (Signed)
Oral swab drug screen was consistent for prescribed medications.  ?

## 2021-03-04 ENCOUNTER — Other Ambulatory Visit: Payer: Self-pay | Admitting: Family

## 2021-03-11 ENCOUNTER — Other Ambulatory Visit: Payer: Self-pay | Admitting: Family

## 2021-03-11 NOTE — Telephone Encounter (Signed)
I have called pt and informed her that her medication has been sent to the pharmacy and she stated understanding. Pt stated that she would rather stay at [facility] since that is what she knows but it is up to her son since she is not going to be the driver. Pt was dx with cancer and that was the reason she has not made an appointment with [place]. She was very busy seeing all types of providers so that is why she lost track of time.  I have called her son and left a voice mail stating that we have refilled his moms meds and to give [place] a call back to schedule a follow up appt.

## 2021-03-11 NOTE — Telephone Encounter (Signed)
She is due for an OV- last seen by me in 03/2020; can you find out if they are staying at this location so I can help her get situated with new provider or planning to go [facility] location?  I would call her son, [name],  listed as contact person.

## 2021-03-13 ENCOUNTER — Other Ambulatory Visit: Payer: Self-pay | Admitting: Family

## 2021-03-13 NOTE — Telephone Encounter (Signed)
Med was refused last week due to no appt. Now they have an appointment set up for 03/25/21.   Okay to refill or wait until she has been seen?

## 2021-03-25 ENCOUNTER — Encounter: Payer: Self-pay | Admitting: Family

## 2021-03-25 ENCOUNTER — Other Ambulatory Visit: Payer: Self-pay

## 2021-03-25 ENCOUNTER — Ambulatory Visit (INDEPENDENT_AMBULATORY_CARE_PROVIDER_SITE_OTHER): Payer: Medicare Other | Admitting: Family

## 2021-03-25 VITALS — BP 140/68 | HR 82 | Temp 98.3°F | Ht 59.0 in

## 2021-03-25 DIAGNOSIS — E039 Hypothyroidism, unspecified: Secondary | ICD-10-CM

## 2021-03-25 DIAGNOSIS — R112 Nausea with vomiting, unspecified: Secondary | ICD-10-CM | POA: Diagnosis not present

## 2021-03-25 DIAGNOSIS — R140 Abdominal distension (gaseous): Secondary | ICD-10-CM | POA: Diagnosis not present

## 2021-03-25 LAB — CBC WITH DIFFERENTIAL/PLATELET
Basophils Absolute: 0.1 10*3/uL (ref 0.0–0.1)
Basophils Relative: 1.1 % (ref 0.0–3.0)
Eosinophils Absolute: 0.1 10*3/uL (ref 0.0–0.7)
Eosinophils Relative: 1.5 % (ref 0.0–5.0)
HCT: 31.6 % — ABNORMAL LOW (ref 36.0–46.0)
Hemoglobin: 9.7 g/dL — ABNORMAL LOW (ref 12.0–15.0)
Lymphocytes Relative: 21.5 % (ref 12.0–46.0)
Lymphs Abs: 1.5 10*3/uL (ref 0.7–4.0)
MCHC: 30.8 g/dL (ref 30.0–36.0)
MCV: 71.6 fl — ABNORMAL LOW (ref 78.0–100.0)
Monocytes Absolute: 0.5 10*3/uL (ref 0.1–1.0)
Monocytes Relative: 6.6 % (ref 3.0–12.0)
Neutro Abs: 4.9 10*3/uL (ref 1.4–7.7)
Neutrophils Relative %: 69.3 % (ref 43.0–77.0)
Platelets: 412 10*3/uL — ABNORMAL HIGH (ref 150.0–400.0)
RBC: 4.41 Mil/uL (ref 3.87–5.11)
RDW: 21 % — ABNORMAL HIGH (ref 11.5–15.5)
WBC: 7 10*3/uL (ref 4.0–10.5)

## 2021-03-25 LAB — COMPREHENSIVE METABOLIC PANEL
ALT: 8 U/L (ref 0–35)
AST: 15 U/L (ref 0–37)
Albumin: 4 g/dL (ref 3.5–5.2)
Alkaline Phosphatase: 82 U/L (ref 39–117)
BUN: 13 mg/dL (ref 6–23)
CO2: 27 mEq/L (ref 19–32)
Calcium: 9.3 mg/dL (ref 8.4–10.5)
Chloride: 102 mEq/L (ref 96–112)
Creatinine, Ser: 0.8 mg/dL (ref 0.40–1.20)
GFR: 67.97 mL/min (ref >60.00)
Glucose, Bld: 88 mg/dL (ref 70–99)
Potassium: 3.6 mEq/L (ref 3.5–5.1)
Sodium: 138 mEq/L (ref 135–145)
Total Bilirubin: 0.4 mg/dL (ref 0.2–1.2)
Total Protein: 7.4 g/dL (ref 6.0–8.3)

## 2021-03-25 LAB — TSH: TSH: 2.18 u[IU]/mL (ref 0.35–4.50)

## 2021-03-25 NOTE — Progress Notes (Signed)
Heather [name] is a 86 y.o. female with the following history as recorded in EpicCare:  Patient Active Problem List   Diagnosis Date Noted  . Goals of care, counseling/discussion 10/10/2020  . PHN (postherpetic neuralgia) 05/09/2020  . Syncope and collapse 02/03/2020  . Fall 04/22/2019  . Elevated CK 04/22/2019  . Leukocytosis 04/22/2019  . Malignant neoplasm of lower lobe of right lung ([place]) 11/16/2018  . Dehydration   . Abdominal pain 02/27/2018  . Colitis 02/27/2018  . SBO (small bowel obstruction) ([place]) 02/27/2018  . Hypokalemia 02/19/2018  . Oral thrush 01/29/2018  . Malnutrition of moderate degree 01/29/2018  . COPD with acute exacerbation ([place]) 01/28/2018  . 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 COPD exacerbation ([place]) 05/29/2016  . Hypoxia 05/29/2016  . Coronary atherosclerosis 07/24/2015  . Atherosclerosis of aorta ([place]) 07/24/2015  . Dysphagia 07/24/2015  . Acute respiratory failure with hypoxia ([place]) 07/23/2015  . Bronchospasm with bronchitis, acute 07/23/2015  . HIP PAIN, LEFT 05/16/2010  . LOW BACK PAIN, CHRONIC 10/01/2009  . PARESTHESIA 10/01/2009  . Hypothyroidism 07/23/2009  . Dyslipidemia 07/23/2009  . Anxiety 07/23/2009  . Anxiety and depression 07/23/2009  . Essential hypertension 07/23/2009  . GERD 07/23/2009  . DIVERTICULOSIS, COLON 07/23/2009  . HEADACHE 07/23/2009    Current Outpatient Medications  Medication Sig Dispense Refill  . Albuterol Sulfate (PROAIR RESPICLICK) 656 (90 Base) MCG/ACT AEPB Inhale 2 puffs into the lungs 4 (four) times daily as needed. (Patient taking differently: Inhale 2 puffs into the lungs 4 (four) times daily as needed (sob and wheezing).) 1 each 6  . amLODipine (NORVASC) 10 MG tablet TAKE (1) TABLET DAILY FOR HIGH BLOOD PRESSURE. 90 tablet 0  . BREO ELLIPTA 200-25 MCG/INH AEPB INHALE 1 PUFF INTO THE LUNGS ONCE DAILY. 180 each 0  . Cholecalciferol (VITAMIN D3 PO) Take 1 tablet by mouth daily.    . clorazepate  (TRANXENE) 7.5 MG tablet TAKE (1) TABLET TWICE DAILY AS NEEDED FOR ANXIETY. 60 tablet 5  . doxepin (SINEQUAN) 10 MG capsule TAKE 1 CAPSULE AT BEDTIME AS NEEDED. 90 capsule 0  . esomeprazole (NEXIUM) 40 MG capsule TAKE 1 CAPSULE DAILY. 90 capsule 0  . folic acid (FOLVITE) 1 MG tablet TAKE 1 TABLET ONCE DAILY. 90 tablet 0  . furosemide (LASIX) 20 MG tablet TAKE 1 TABLET DAILY IF NEEDED FOR SIGNIFICANT LOWER EXTREMITY SWELLING. 90 tablet 0  . gabapentin (NEURONTIN) 100 MG capsule TAKE (1) CAPSULE THREE TIMES DAILY. 90 capsule 0  . levothyroxine (SYNTHROID) 25 MCG tablet TAKE 1 TABLET DAILY BEFORE BREAKFAST. 90 tablet 0  . losartan (COZAAR) 25 MG tablet TAKE (1) TABLET DAILY FOR HIGH BLOOD PRESSURE. 90 tablet 0  . MYRBETRIQ 25 MG TB24 tablet TAKE 1 TABLET ONCE DAILY. 30 tablet 3  . oxybutynin (DITROPAN-XL) 5 MG 24 hr tablet TAKE 1 TABLET IN THE MORNING FOR BLADDER CONTROL. 90 tablet 0  . potassium chloride SA (KLOR-CON) 20 MEQ tablet TAKE 1 TABLET ONCE DAILY. 90 tablet 0  . pregabalin (LYRICA) 25 MG capsule TAKE (1) CAPSULE TWICE DAILY. 60 capsule 3  . promethazine (PHENERGAN) 25 MG tablet TAKE 1 TABLET EVERY 8 HOURS AS NEEDED FOR NAUSEA & VOMITING. 21 tablet 0  . senna-docusate (SENOKOT-S) 8.6-50 MG tablet Take 2 tablets by mouth at bedtime. (Patient taking differently: Take 2 tablets by mouth at bedtime as needed (constipation).) 60 tablet 2  . sertraline (ZOLOFT) 50 MG tablet TAKE 1 TABLET EACH DAY. 90 tablet 0  . traMADol (ULTRAM) 50  MG tablet TAKE ONE TABLET IN THE MORNING, ONE TABLET AT NOON, AND 1 TABLET IN THE EVENING. 90 tablet 5  . VITAMIN D-1000 MAX ST 25 MCG (1000 UT) tablet Take 1,000 Units by mouth daily.     No current facility-administered medications for this visit.    Allergies: Tylenol [acetaminophen] and Symbicort [budesonide-formoterol fumarate]  Past Medical History:  Diagnosis Date  . ANXIETY 07/23/2009  . Atherosclerosis of aorta ([place]) 07/24/2015  . Complication of  anesthesia 7 or 8 yrs ago   woke up during colonscopy  . COPD (chronic obstructive pulmonary disease) ([place])   . Coronary atherosclerosis 07/24/2015  . DEPRESSION 07/23/2009  . DIVERTICULOSIS, COLON 07/23/2009  . GERD 07/23/2009  . Headache(784.0) 07/23/2009   occasional  . Hemorrhoids   . HIP PAIN, LEFT 05/16/2010  . History of Crohn's disease   . HYPERLIPIDEMIA 07/23/2009  . HYPERTENSION 07/23/2009  . HYPOTHYROIDISM 07/23/2009  . IBS (irritable bowel syndrome)   . Insomnia   . LOW BACK PAIN, CHRONIC 10/01/2009  . lung ca dx'd 2019   SBRT comp 12/2018  . PARESTHESIA 10/01/2009  . Shingles 2006   back    Past Surgical History:  Procedure Laterality Date  . ABDOMINAL HYSTERECTOMY  age 16 or 28  . APPENDECTOMY  yrs ago  . BILATERAL SALPINGOOPHORECTOMY  age 89 or 71  . COLONOSCOPY N/A 11/17/2013   Procedure: COLONOSCOPY;  Surgeon: Cleotis Nipper, MD;  Location: [facility];  Service: Endoscopy;  Laterality: N/A;  . ESOPHAGOGASTRODUODENOSCOPY N/A 11/17/2013   Procedure: ESOPHAGOGASTRODUODENOSCOPY (EGD);  Surgeon: Cleotis Nipper, MD;  Location: [facility];  Service: Endoscopy;  Laterality: N/A;  . HEMORRHOID SURGERY  yrs ago  . TONSILLECTOMY  yrs ago    Family History  Problem Relation Age of Onset  . Cancer Mother        Unknown type  . Cancer Sister        Breast  . Clotting disorder Neg Hx     Social History   Tobacco Use  . Smoking status: Former Smoker    Packs/day: 0.50    Types: Cigarettes    Start date: 07/16/1960    Quit date: 07/16/1998    Years since quitting: 22.7  . Smokeless tobacco: Never Used  Substance Use Topics  . Alcohol use: No    Alcohol/week: 0.0 standard drinks    Subjective:   Accompanied by son; has not been seen here in almost 1 year; has under treatment for lung cancer this past year- finished radiation in November 2021- majority of healthcare needs managed by specialists;  Complaining of abdominal bloating for the past 2-3 weeks/ some  relief with phenergan; per patient, she is "not able to keep anything down." Eats her breakfast and typically throws up within about 30 minutes; able to keep liquids and Ensure down; she is adamant that she is not seeing blood with emesis or stools but then mentions that her emesis can "look dark" sometimes;   Objective:  Vitals:   03/25/21 1355  BP: 140/68  Pulse: 82  Temp: 98.3 F (36.8 C)  TempSrc: Oral  SpO2: 95%  Height: _0  (1.499 m)    General: Well developed, well nourished, in no acute distress; frail appearing  Skin : Warm and dry.  Head: Normocephalic and atraumatic  Eyes: Sclera and conjunctiva clear; pupils round and reactive to light; extraocular movements intact  Ears: External normal; canals clear; tympanic membranes normal  Oropharynx: Pink, supple. No suspicious lesions  Neck:  Supple without thyromegaly, adenopathy  Lungs: Respirations unlabored;  Abdomen: Soft; nontender; nondistended; limited exam as patient is seen in wheelchair Neurologic: Alert and oriented; speech intact; face symmetrical; in wheelchair   Assessment:  1. Abdominal bloating   2. Nausea and vomiting, intractability of vomiting not specified, unspecified vomiting type   3. Hypothyroidism, unspecified type     Plan:  Update labs today; schedule for abd/ pelvic CT; will most likely need to see GI for further evaluation;  This visit occurred during the SARS-CoV-2 public health emergency.  Safety protocols were in place, including screening questions prior to the visit, additional usage of staff PPE, and extensive cleaning of exam room while observing appropriate contact time as indicated for disinfecting solutions.     Return in about 4 months (around 07/25/2021) for [facility] with Dr. Ronnald Ramp.  Orders Placed This Encounter  Procedures  . CT Abdomen Pelvis W Contrast    Lori/TR [facility] Medicare Getting labs 03/25/21 Intractable Vomiting    Standing Status:   Future    Standing Expiration Date:    03/25/2022    Scheduling Instructions:     Please schedule after 2 pm    Order Specific Question:   If indicated for the ordered procedure, I authorize the administration of contrast media per Radiology protocol    Answer:   Yes    Order Specific Question:   Preferred imaging location?    Answer:   [place]    Order Specific Question:   Is Oral Contrast requested for this exam?    Answer:   Yes, Per Radiology protocol  . CBC with Differential/Platelet    Standing Status:   Future    Number of Occurrences:   1    Standing Expiration Date:   03/25/2022  . Comp Met (CMET)    Standing Status:   Future    Number of Occurrences:   1    Standing Expiration Date:   03/25/2022  . TSH    Standing Status:   Future    Number of Occurrences:   1    Standing Expiration Date:   03/25/2022    Requested Prescriptions    No prescriptions requested or ordered in this encounter

## 2021-03-25 NOTE — Patient Instructions (Signed)
Schedule a TOC with Dr. Ronnald Ramp for about 4 months

## 2021-03-26 ENCOUNTER — Telehealth: Payer: Self-pay | Admitting: Physician Assistant

## 2021-03-26 ENCOUNTER — Other Ambulatory Visit: Payer: Self-pay | Admitting: Family

## 2021-03-26 ENCOUNTER — Other Ambulatory Visit (INDEPENDENT_AMBULATORY_CARE_PROVIDER_SITE_OTHER): Payer: Medicare Other

## 2021-03-26 DIAGNOSIS — R112 Nausea with vomiting, unspecified: Secondary | ICD-10-CM

## 2021-03-26 DIAGNOSIS — R140 Abdominal distension (gaseous): Secondary | ICD-10-CM

## 2021-03-26 DIAGNOSIS — D509 Iron deficiency anemia, unspecified: Secondary | ICD-10-CM

## 2021-03-26 DIAGNOSIS — D649 Anemia, unspecified: Secondary | ICD-10-CM | POA: Diagnosis not present

## 2021-03-26 LAB — IBC + FERRITIN
Ferritin: 8.1 ng/mL — ABNORMAL LOW (ref 10.0–291.0)
Iron: 33 ug/dL — ABNORMAL LOW (ref 42–145)
Saturation Ratios: 7 % — ABNORMAL LOW (ref 20.0–50.0)
Transferrin: 335 mg/dL (ref 212.0–360.0)

## 2021-03-26 NOTE — Telephone Encounter (Signed)
Scheduled appt per 4/19 sch msg. Pt's son is aware.

## 2021-03-27 ENCOUNTER — Other Ambulatory Visit: Payer: Self-pay

## 2021-03-27 ENCOUNTER — Ambulatory Visit (INDEPENDENT_AMBULATORY_CARE_PROVIDER_SITE_OTHER)
Admission: RE | Admit: 2021-03-27 | Discharge: 2021-03-27 | Disposition: A | Discharge status: Home / Self Care | Payer: Medicare Other | Source: Ambulatory Visit | Attending: Family | Admitting: Family

## 2021-03-27 DIAGNOSIS — R112 Nausea with vomiting, unspecified: Secondary | ICD-10-CM

## 2021-03-27 DIAGNOSIS — R1110 Vomiting, unspecified: Secondary | ICD-10-CM | POA: Diagnosis not present

## 2021-03-27 MED ORDER — IOHEXOL 300 MG/ML  SOLN
75.0000 mL | Freq: Once | INTRAMUSCULAR | Status: AC | PRN
  Administered 2021-03-27: 75 mL via INTRAVENOUS

## 2021-03-28 ENCOUNTER — Other Ambulatory Visit: Payer: Self-pay | Admitting: Family

## 2021-03-28 DIAGNOSIS — D649 Anemia, unspecified: Secondary | ICD-10-CM

## 2021-03-28 DIAGNOSIS — R140 Abdominal distension (gaseous): Secondary | ICD-10-CM

## 2021-03-28 DIAGNOSIS — R112 Nausea with vomiting, unspecified: Secondary | ICD-10-CM

## 2021-04-08 NOTE — Progress Notes (Signed)
[place] Telephone:([phone]   [phone]) [phone]  CONSULT NOTE  REFERRING PHYSICIAN: Dr. [name]  REASON FOR CONSULTATION:  Iron Deficiency Anemia  HPI Heather [name] is a 86 y.o. female COPD, hypertension, coronary artery disease, colitis, small bowel obstruction, hypothyroidism, GERD, diverticulosis, anxiety, presumed non-small cell lung cancer s/p SBRT in 2021 and esophageal stricture was referred to the clinic for evaluation ofn iron deficiency anemia.    The patient recently had a follow-up appointment with her PCP on 03/25/21.  She had not been seen in the clinic for approximately 1 year.  At that appointment, the patient was endorsing abdominal bloating for 2 to 3 weeks with some relief with Phenergan.  She reportedly cannot "keep food down".  He denies hematemesis, but reports that the emesis is dark. She had gastrointestinal issues in the past which she was evaluated by Dr. [name] from gastroenterology. They reportedly did not find an etiology for her symptoms.   The patient had lab work performed including a CBC which showed microcytic anemia with a hemoglobin of 9.7 and an MCV of 71.6.  The patient's platelet count was 412 K and her white count was within normal limits at 7.  The patient had iron studies performed which showed a low iron at 33.  A low saturation at 7%, and a low ferritin at 8.1.  The patient was subsequently referred to the clinic today.  The patient was also referred to gastroenterology and has an appointment with them tomorrow.  The patient had a CT of the abdomen pelvis performed which was unremarkable for any etiology of her abdominal bloating and emesis. Unclear if she carries the diagnosis of irritable bowel syndrome. Her last colonoscopy was in 2014 showed scattered diverticulosis, a slightly irregular area of mucosa likely representing a flat polyp. Her EGD was unremarkable. No clear etiology of her nausea or food intolerance  was found.  Per chart review, it appers the patient also has a history of B12 deficiency and has received IM B12 injections in the past, the most recent being 04/12/20. She also takes B12 by mouth daily and folic acid daily.   Per chart review, it appears that the anemia on and off started at least 10 years ago which is the oldest records available to me. She has never required a blood or iron infusion before besides during a hospitalization several years ago per the patient's son. He cannot recall what her hospitalization was for and that her hospitalizations "blend together". The patient states this is the first time she has been told she was anemic to her knowledge. She was instructed to start taking iron supplements by her PCP but she has not done so. The patient has severe constipation and she is concerned that taking iron supplements will worsen her constipation for which she already uses laxatives regularly. She does not have a diet rich in iron rich food. She does not eat a lot of red meat or green leafy vegetables especially given her recent issues with emesis.   She reports her fatigue and decreased exercise tolerance. She denies significant shortness of breath with exertion. She denies any abnormal bleeding including epistaxis, ginigal bleeding, rectal bleeding, melena, hemoptysis, or hematemesis. She does not take any blood thinners or NSAIDs. She denies pencil thin stools. She denies fevers or lymphadenopathy. She reports night sweats recently and feeling cold. Denies history of bariatric surgery.   The patient's family history consists of a mother who passed away  in 1972 secondary to cancer.  The patient is unsure of the primary site of her malignancy.  The patient's father had rheumatoid arthritis. Her sister is in her 78's and had breast cancer 40 years ago or so.   The patient used to work at an [employer].  She is widowed and has 1 child.  She denies any drug or alcohol use.  The  patient estimates that she smoked approximately 20 years averaging half a pack of cigarettes per day.  The patient estimates that she quit smoking 15 to 16 years ago.  HPI  Past Medical History:  Diagnosis Date  . ANXIETY 07/23/2009  . Atherosclerosis of aorta ([place]) 07/24/2015  . Complication of anesthesia 7 or 8 yrs ago   woke up during colonscopy  . COPD (chronic obstructive pulmonary disease) ([place])   . Coronary atherosclerosis 07/24/2015  . DEPRESSION 07/23/2009  . DIVERTICULOSIS, COLON 07/23/2009  . GERD 07/23/2009  . Headache(784.0) 07/23/2009   occasional  . Hemorrhoids   . HIP PAIN, LEFT 05/16/2010  . History of Crohn's disease   . HYPERLIPIDEMIA 07/23/2009  . HYPERTENSION 07/23/2009  . HYPOTHYROIDISM 07/23/2009  . IBS (irritable bowel syndrome)   . Insomnia   . LOW BACK PAIN, CHRONIC 10/01/2009  . lung ca dx'd 2019   SBRT comp 12/2018  . PARESTHESIA 10/01/2009  . Shingles 2006   back    Past Surgical History:  Procedure Laterality Date  . ABDOMINAL HYSTERECTOMY  age 77 or 83  . APPENDECTOMY  yrs ago  . BILATERAL SALPINGOOPHORECTOMY  age 68 or 46  . COLONOSCOPY N/A 11/17/2013   Procedure: COLONOSCOPY;  Surgeon: Cleotis Nipper, MD;  Location: [facility];  Service: Endoscopy;  Laterality: N/A;  . ESOPHAGOGASTRODUODENOSCOPY N/A 11/17/2013   Procedure: ESOPHAGOGASTRODUODENOSCOPY (EGD);  Surgeon: Cleotis Nipper, MD;  Location: [facility];  Service: Endoscopy;  Laterality: N/A;  . HEMORRHOID SURGERY  yrs ago  . TONSILLECTOMY  yrs ago    Family History  Problem Relation Age of Onset  . Cancer Mother        Unknown type  . Cancer Sister        Breast  . Clotting disorder Neg Hx     Social History Social History   Tobacco Use  . Smoking status: Former Smoker    Packs/day: 0.50    Types: Cigarettes    Start date: 07/16/1960    Quit date: 07/16/1998    Years since quitting: 22.7  . Smokeless tobacco: Never Used  Vaping Use  . Vaping Use: Never used  Substance  Use Topics  . Alcohol use: No    Alcohol/week: 0.0 standard drinks  . Drug use: No    Allergies  Allergen Reactions  . Tylenol [Acetaminophen] Other (See Comments)    Nightmares  . Symbicort [Budesonide-Formoterol Fumarate] Other (See Comments)    Pt felt like her tongue was swollen, could not swallow    Current Outpatient Medications  Medication Sig Dispense Refill  . Albuterol Sulfate (PROAIR RESPICLICK) 222 (90 Base) MCG/ACT AEPB Inhale 2 puffs into the lungs 4 (four) times daily as needed. (Patient taking differently: Inhale 2 puffs into the lungs 4 (four) times daily as needed (sob and wheezing).) 1 each 6  . amLODipine (NORVASC) 10 MG tablet TAKE (1) TABLET DAILY FOR HIGH BLOOD PRESSURE. 90 tablet 0  . BREO ELLIPTA 200-25 MCG/INH AEPB INHALE 1 PUFF INTO THE LUNGS ONCE DAILY. 180 each 0  . Cholecalciferol (VITAMIN D3 PO) Take 1 tablet  by mouth daily.    . clorazepate (TRANXENE) 7.5 MG tablet TAKE (1) TABLET TWICE DAILY AS NEEDED FOR ANXIETY. 60 tablet 5  . doxepin (SINEQUAN) 10 MG capsule TAKE 1 CAPSULE AT BEDTIME AS NEEDED. 90 capsule 0  . esomeprazole (NEXIUM) 40 MG capsule TAKE 1 CAPSULE DAILY. 90 capsule 0  . folic acid (FOLVITE) 1 MG tablet TAKE 1 TABLET ONCE DAILY. 90 tablet 0  . furosemide (LASIX) 20 MG tablet TAKE 1 TABLET DAILY IF NEEDED FOR SIGNIFICANT LOWER EXTREMITY SWELLING. 90 tablet 0  . gabapentin (NEURONTIN) 100 MG capsule TAKE (1) CAPSULE THREE TIMES DAILY. 90 capsule 0  . levothyroxine (SYNTHROID) 25 MCG tablet TAKE 1 TABLET DAILY BEFORE BREAKFAST. 90 tablet 0  . losartan (COZAAR) 25 MG tablet TAKE (1) TABLET DAILY FOR HIGH BLOOD PRESSURE. 90 tablet 0  . MYRBETRIQ 25 MG TB24 tablet TAKE 1 TABLET ONCE DAILY. 30 tablet 3  . oxybutynin (DITROPAN-XL) 5 MG 24 hr tablet TAKE 1 TABLET IN THE MORNING FOR BLADDER CONTROL. 90 tablet 0  . potassium chloride SA (KLOR-CON) 20 MEQ tablet TAKE 1 TABLET ONCE DAILY. 90 tablet 0  . pregabalin (LYRICA) 25 MG capsule TAKE (1)  CAPSULE TWICE DAILY. 60 capsule 3  . promethazine (PHENERGAN) 25 MG tablet TAKE 1 TABLET EVERY 8 HOURS AS NEEDED FOR NAUSEA & VOMITING. 21 tablet 0  . senna-docusate (SENOKOT-S) 8.6-50 MG tablet Take 2 tablets by mouth at bedtime. (Patient taking differently: Take 2 tablets by mouth at bedtime as needed (constipation).) 60 tablet 2  . sertraline (ZOLOFT) 50 MG tablet TAKE 1 TABLET EACH DAY. 90 tablet 0  . traMADol (ULTRAM) 50 MG tablet TAKE ONE TABLET IN THE MORNING, ONE TABLET AT NOON, AND 1 TABLET IN THE EVENING. 90 tablet 5  . VITAMIN D-1000 MAX ST 25 MCG (1000 UT) tablet Take 1,000 Units by mouth daily.     No current facility-administered medications for this visit.    REVIEW OF SYSTEMS:   Review of Systems  Constitutional: Positive for fatigue, appetite change, and weight loss. Positive for cold in tolerance. Negative for chills and fever.  HENT: Negative for mouth sores, nosebleeds, sore throat and trouble swallowing.   Eyes: Negative for eye problems and icterus.  Respiratory: Negative for cough, hemoptysis, shortness of breath and wheezing.   Cardiovascular: Negative for chest pain and leg swelling.  Gastrointestinal: Positive for LUQ abdominal discomfort, nausea/vomiting, and constipation. Negative for diarrhea. Genitourinary: Negative for bladder incontinence, difficulty urinating, dysuria, frequency and hematuria.   Musculoskeletal: Negative for back pain, gait problem, neck pain and neck stiffness.  Skin: Negative for itching and rash.  Neurological: Negative for dizziness, extremity weakness, gait problem, headaches, light-headedness and seizures.  Hematological: Negative for adenopathy. Does not bruise/bleed easily.  Psychiatric/Behavioral: Negative for confusion, depression and sleep disturbance. The patient is not nervous/anxious.     PHYSICAL EXAMINATION:  Blood pressure 140/66, pulse 83, temperature 98.2 F (36.8 C), temperature source Tympanic, resp. rate 18, weight  120 lb 4.8 oz (54.6 kg), SpO2 95 %.  ECOG PERFORMANCE STATUS: 1 - Symptomatic but completely ambulatory  Physical Exam  Constitutional: Oriented to person, place, and time and well-developed, well-nourished, and in no distress.  HENT:  Head: Normocephalic and atraumatic.  Mouth/Throat: Oropharynx is clear and moist. No oropharyngeal exudate.  Eyes: Conjunctivae are normal. Right eye exhibits no discharge. Left eye exhibits no discharge. No scleral icterus.  Neck: Normal range of motion. Neck supple.  Cardiovascular: Normal rate, regular rhythm, normal heart sounds  and intact distal pulses.   Pulmonary/Chest: Effort normal and breath sounds normal. No respiratory distress. No wheezes. No rales.  Abdominal: Soft. Some mild LUQ tenderness. Bowel sounds are normal. Exhibits no distension and no mass. No rebound tenderness  Musculoskeletal: Normal range of motion. Exhibits no edema.  Lymphadenopathy:    No cervical adenopathy.  Neurological: Alert and oriented to person, place, and time. Exhibits muscle wasting. Examined in the wheelchair. Skin: Skin is warm and dry. No rash noted. Not diaphoretic. No erythema. No pallor.  Psychiatric: Mood, memory and judgment normal.  Vitals reviewed.  LABORATORY DATA: Lab Results  Component Value Date   WBC 7.5 04/10/2021   HGB 9.5 (L) 04/10/2021   HCT 32.2 (L) 04/10/2021   MCV 76.3 (L) 04/10/2021   PLT 379 04/10/2021      Chemistry      Component Value Date/Time   NA 138 04/10/2021 1309   K 4.1 04/10/2021 1309   CL 103 04/10/2021 1309   CO2 26 04/10/2021 1309   BUN 9 04/10/2021 1309   CREATININE 0.94 04/10/2021 1309      Component Value Date/Time   CALCIUM 9.4 04/10/2021 1309   ALKPHOS 85 04/10/2021 1309   AST 16 04/10/2021 1309   ALT 7 04/10/2021 1309   BILITOT 0.4 04/10/2021 1309       RADIOGRAPHIC STUDIES: CT Abdomen Pelvis W Contrast  Result Date: 03/27/2021 CLINICAL DATA:  Nausea, vomiting. EXAM: CT ABDOMEN AND PELVIS WITH  CONTRAST TECHNIQUE: Multidetector CT imaging of the abdomen and pelvis was performed using the standard protocol following bolus administration of intravenous contrast. CONTRAST:  69mL OMNIPAQUE IOHEXOL 300 MG/ML  SOLN COMPARISON:  October 31, 2019.  December 03, 2020. 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 Hepatobiliary: No focal liver abnormality is seen. No gallstones, gallbladder wall thickening, or biliary dilatation. Pancreas: Unremarkable. No pancreatic ductal dilatation or surrounding inflammatory changes. Spleen: Normal in size without focal abnormality. Adrenals/Urinary Tract: Adrenal glands are unremarkable. Kidneys are normal, without renal calculi, focal lesion, or hydronephrosis. Bladder is unremarkable. Stomach/Bowel: Stomach appears normal. There is no evidence of bowel obstruction or inflammation. Status post appendectomy. Sigmoid diverticulosis is noted without inflammation. Vascular/Lymphatic: Aortic atherosclerosis. No enlarged abdominal or pelvic lymph nodes. Reproductive: Status post hysterectomy. No adnexal masses. Other: No abdominal wall hernia or abnormality. No abdominopelvic ascites. Musculoskeletal: No acute or significant osseous findings. IMPRESSION: Sigmoid diverticulosis without inflammation. No acute abnormality seen in the abdomen or pelvis. Aortic Atherosclerosis (ICD10-I70.0). Electronically Signed   By: Marijo Conception M.D.   On: 03/27/2021 16:27    ASSESSMENT: This is a very pleasant 86 year old Caucasian female referred to the clinic for iron deficiency anemia    PLAN: The patient was seen with Dr. Julien Nordmann today. The patient had several lab studies performed including a CBC, CMP, iron studies, ferritin, SPEP with immunofixation, vitamin B12, andfolate.  Her CBC showspersistent microcytic anemia with a hemoglobin of 9.5. Her MCV is 76.3.Patient CMP is unremarkable. Her folate and SPEP with immunofixation  are still pending at this time. Her iron studies are pending but she likely continues to have iron deficiency since she has not started taking iron supplements at this time due to fear of worsening constipation.   We will arrange for the patient to have iron infusions with Venofer 300 mg weekly x3 since she is fearful of oral iron supplements exacerbating her constipation.  We will see the patient back for follow-up visit in 8 weeks  for evaluation repeat CBC, iron studies, and ferritin.   I provided the patient with a handout of iron rich food and encouraged her to increase her dietary intake of iron.  The patient was strongly encouraged to follow up with GI to rule out GI blood loss and to discuss her emesis and food intolerance.   The patient voices understanding of current disease status and treatment options and is in agreement with the current care plan.  All questions were answered. The patient knows to call the clinic with any problems, questions or concerns. We can certainly see the patient much sooner if necessary.  Thank you so much for allowing me to participate in the care of Heather [name]. I will continue to follow up the patient with you and assist in her care.   Disclaimer: This note was dictated with voice recognition software. Similar sounding words can inadvertently be transcribed and may not be corrected upon review.   Yaresly Menzel L Rahshawn Remo Apr 10, 2021, 2:39 PM  ADDENDUM: Hematology/Oncology Attending: This is a very pleasant 86 years old white female with medical history significant for multiple issues including COPD, hypertension, GERD, colitis, small bowel obstruction, hypothyroidism, coronary artery disease as well as presumed history of non-small cell lung cancer status post SBRT in 2021 under the care of Dr. Lisbeth Renshaw.  The patient was seen recently by her primary care physician for routine evaluation and repeat CBC showed persistent anemia with hemoglobin  of 9.7 and MCV of 71.6.  She had normal white blood count as well as platelet count.  The iron studies showed low serum ferritin of 8.1 and iron saturation of 7%.  The patient was referred to gastroenterology and has an appointment with them tomorrow for evaluation of her microcytic anemia and to rule out gastrointestinal blood loss. She was also referred to [place] today for evaluation and consideration of IV iron infusion since the patient has intolerability to the oral iron tablets. We repeated her blood work today and hemoglobin was 9.5, hematocrit 32.2 with MCV of 76.3%.  She has low serum iron of 27 with iron saturation of 5% and total iron binding capacity of 527.  Ferritin level was 7. I recommended for the patient to proceed with iron infusion with Venofer 300 mg IV weekly for 3 doses. We will see the patient back for follow-up visit in around 2 months for reevaluation and repeat CBC, iron study and ferritin. She was advised to call immediately if she has any other concerning symptoms in the interval. The total time spent in the appointment was 60 minutes.  Disclaimer: This note was dictated with voice recognition software. Similar sounding words can inadvertently be transcribed and may be missed upon review. Eilleen Kempf, MD 04/10/21

## 2021-04-10 ENCOUNTER — Inpatient Hospital Stay: Payer: Medicare Other

## 2021-04-10 ENCOUNTER — Other Ambulatory Visit: Payer: Self-pay | Admitting: Physician Assistant

## 2021-04-10 ENCOUNTER — Other Ambulatory Visit: Payer: Self-pay

## 2021-04-10 ENCOUNTER — Telehealth: Payer: Self-pay | Admitting: Physician Assistant

## 2021-04-10 ENCOUNTER — Inpatient Hospital Stay: Payer: Medicare Other | Attending: Physician Assistant | Admitting: Physician Assistant

## 2021-04-10 ENCOUNTER — Encounter: Payer: Self-pay | Admitting: Physician Assistant

## 2021-04-10 VITALS — BP 140/66 | HR 83 | Temp 98.2°F | Resp 18 | Wt 120.3 lb

## 2021-04-10 DIAGNOSIS — K219 Gastro-esophageal reflux disease without esophagitis: Secondary | ICD-10-CM | POA: Insufficient documentation

## 2021-04-10 DIAGNOSIS — Z79899 Other long term (current) drug therapy: Secondary | ICD-10-CM | POA: Diagnosis not present

## 2021-04-10 DIAGNOSIS — Z87891 Personal history of nicotine dependence: Secondary | ICD-10-CM | POA: Insufficient documentation

## 2021-04-10 DIAGNOSIS — D509 Iron deficiency anemia, unspecified: Secondary | ICD-10-CM | POA: Diagnosis not present

## 2021-04-10 DIAGNOSIS — I10 Essential (primary) hypertension: Secondary | ICD-10-CM | POA: Diagnosis not present

## 2021-04-10 DIAGNOSIS — D649 Anemia, unspecified: Secondary | ICD-10-CM

## 2021-04-10 DIAGNOSIS — E039 Hypothyroidism, unspecified: Secondary | ICD-10-CM | POA: Insufficient documentation

## 2021-04-10 DIAGNOSIS — J449 Chronic obstructive pulmonary disease, unspecified: Secondary | ICD-10-CM | POA: Diagnosis not present

## 2021-04-10 DIAGNOSIS — I2510 Atherosclerotic heart disease of native coronary artery without angina pectoris: Secondary | ICD-10-CM | POA: Diagnosis not present

## 2021-04-10 DIAGNOSIS — K5900 Constipation, unspecified: Secondary | ICD-10-CM | POA: Diagnosis not present

## 2021-04-10 LAB — CMP (CANCER CENTER ONLY)
ALT: 7 U/L (ref 0–44)
AST: 16 U/L (ref 15–41)
Albumin: 4.1 g/dL (ref 3.5–5.0)
Alkaline Phosphatase: 85 U/L (ref 38–126)
Anion gap: 9 (ref 5–15)
BUN: 9 mg/dL (ref 8–23)
CO2: 26 mmol/L (ref 22–32)
Calcium: 9.4 mg/dL (ref 8.9–10.3)
Chloride: 103 mmol/L (ref 98–111)
Creatinine: 0.94 mg/dL (ref 0.44–1.00)
GFR, Estimated: >60 mL/min (ref >60)
Glucose, Bld: 101 mg/dL — ABNORMAL HIGH (ref 70–99)
Potassium: 4.1 mmol/L (ref 3.5–5.1)
Sodium: 138 mmol/L (ref 135–145)
Total Bilirubin: 0.4 mg/dL (ref 0.3–1.2)
Total Protein: 7.6 g/dL (ref 6.5–8.1)

## 2021-04-10 LAB — CBC WITH DIFFERENTIAL (CANCER CENTER ONLY)
Abs Immature Granulocytes: 0.02 10*3/uL (ref 0.00–0.07)
Basophils Absolute: 0.1 10*3/uL (ref 0.0–0.1)
Basophils Relative: 1 %
Eosinophils Absolute: 0 10*3/uL (ref 0.0–0.5)
Eosinophils Relative: 0 %
HCT: 32.2 % — ABNORMAL LOW (ref 36.0–46.0)
Hemoglobin: 9.5 g/dL — ABNORMAL LOW (ref 12.0–15.0)
Immature Granulocytes: 0 %
Lymphocytes Relative: 18 %
Lymphs Abs: 1.4 10*3/uL (ref 0.7–4.0)
MCH: 22.5 pg — ABNORMAL LOW (ref 26.0–34.0)
MCHC: 29.5 g/dL — ABNORMAL LOW (ref 30.0–36.0)
MCV: 76.3 fL — ABNORMAL LOW (ref 80.0–100.0)
Monocytes Absolute: 0.5 10*3/uL (ref 0.1–1.0)
Monocytes Relative: 6 %
Neutro Abs: 5.6 10*3/uL (ref 1.7–7.7)
Neutrophils Relative %: 75 %
Platelet Count: 379 10*3/uL (ref 150–400)
RBC: 4.22 MIL/uL (ref 3.87–5.11)
RDW: 20.2 % — ABNORMAL HIGH (ref 11.5–15.5)
WBC Count: 7.5 10*3/uL (ref 4.0–10.5)
nRBC: 0 % (ref 0.0–0.2)

## 2021-04-10 LAB — FERRITIN: Ferritin: 7 ng/mL — ABNORMAL LOW (ref 11–307)

## 2021-04-10 LAB — IRON AND TIBC
Iron: 27 ug/dL — ABNORMAL LOW (ref 28–170)
Saturation Ratios: 5 % — ABNORMAL LOW (ref 10.4–31.8)
TIBC: 527 ug/dL — ABNORMAL HIGH (ref 250–450)
UIBC: 500 ug/dL

## 2021-04-10 LAB — VITAMIN B12: Vitamin B-12: 1394 pg/mL — ABNORMAL HIGH (ref 180–914)

## 2021-04-10 LAB — FOLATE: Folate: >100 ng/mL (ref >5.9)

## 2021-04-10 NOTE — Telephone Encounter (Signed)
Scheduled appt per 5/4 sch msg. Pt aware.

## 2021-04-10 NOTE — Patient Instructions (Signed)

## 2021-04-11 ENCOUNTER — Other Ambulatory Visit: Payer: Self-pay | Admitting: Family

## 2021-04-11 ENCOUNTER — Ambulatory Visit: Payer: Medicare Other | Admitting: Physician Assistant

## 2021-04-11 ENCOUNTER — Encounter: Payer: Self-pay | Admitting: Physician Assistant

## 2021-04-11 VITALS — BP 118/62 | HR 85 | Ht 59.0 in | Wt 120.0 lb

## 2021-04-11 DIAGNOSIS — K5900 Constipation, unspecified: Secondary | ICD-10-CM | POA: Diagnosis not present

## 2021-04-11 DIAGNOSIS — R140 Abdominal distension (gaseous): Secondary | ICD-10-CM | POA: Diagnosis not present

## 2021-04-11 DIAGNOSIS — K219 Gastro-esophageal reflux disease without esophagitis: Secondary | ICD-10-CM

## 2021-04-11 DIAGNOSIS — D509 Iron deficiency anemia, unspecified: Secondary | ICD-10-CM

## 2021-04-11 MED ORDER — NA SULFATE-K SULFATE-MG SULF 17.5-3.13-1.6 GM/177ML PO SOLN: 1.0000 | Freq: Once | ORAL | 0 refills | Status: AC

## 2021-04-11 NOTE — Patient Instructions (Signed)
If you are age 84 or older, your body mass index should be between 23-30. Your Body mass index is 24.24 kg/m. If this is out of the aforementioned range listed, please consider follow up with your Primary Care Provider.  You have been scheduled for an endoscopy and colonoscopy. Please follow the written instructions given to you at your visit today. Please pick up your prep supplies at the pharmacy within the next 1-3 days. If you use inhalers (even only as needed), please bring them with you on the day of your procedure.  Increase your Dulcolax to 3-4 tablets daily.  Eat prunes 4-5 a day or 8 ounces of prune juice daily.  Continue Nexium 40 mg 1 capsule daily.   Dink 60 ounces of water daily.  Follow up pending the results of your Colonoscopy/Endoscopy or as needed.  Thank you for entrusting me with your care and choosing [facility].  Amy Esterwood, PA-C

## 2021-04-14 ENCOUNTER — Encounter: Payer: Self-pay | Admitting: Physician Assistant

## 2021-04-14 NOTE — Progress Notes (Signed)
Subjective:    Patient ID: Heather [name], female    DOB: 09/24/1935, 86 y.o.   MRN: [id number]  HPI Heather [name] is a pleasant 86 year old white female, new to GI today referred by Wille Glaser, FNP for evaluation of iron deficiency anemia. Patient has history of hypertension, COPD-no oxygen use, history of lung cancer status post SBRT 2021, history of coronary artery disease prior small bowel obstruction, chronic GERD, diverticulosis and history of esophageal stricture. She had been evaluated in the past by Dr. [name] in 2014 with history of collagenous colitis. At colonoscopy she was found to have 1 flat polyp in the proximal colon and scattered diverticulosis.  Random biopsies were negative, the polyp was found to be hyperplastic. EGD done at that same time for nausea and food intolerance was negative and duodenal biopsies were negative, showed chronic atrophic gastritis. She has just been evaluated by oncology and is to start Venofer injections.  Most recent labs yesterday, hemoglobin 9.5 MCV 71, serum iron 27 iron saturation 5 TIBC of 527 Iron studies 2019 showed ferritin of 103 CT abdomen and pelvis was done April 2022 with finding of sigmoid diverticulosis no inflammation and no other acute abnormality in the abdomen or pelvis. Patient says she has terrible problems with constipation.  She has been using Dulcolax 2 tablets/day which traditionally had been working but does not seem to be producing a bowel movement recently.  She complains of abdominal bloating of her whole abdomen especially high in the upper abdomen.  This has been worse over the past year since she underwent radiation.  She has been on Nexium daily which does help heartburn and indigestion, she endorses occasional solid food dysphagia and says she chews her foods very carefully.  She has not noted any melena or hematochezia.  She had tried to take iron in the past and found it very constipating and is declining to take  any oral iron at this point for fear of worsening her constipation.  Review of Systems Pertinent positive and negative review of systems were noted in the above HPI section.  All other review of systems was otherwise negative.  Outpatient Encounter Medications as of 04/11/2021  Medication Sig  . Albuterol Sulfate (PROAIR RESPICLICK) 010 (90 Base) MCG/ACT AEPB Inhale 2 puffs into the lungs 4 (four) times daily as needed. (Patient taking differently: Inhale 2 puffs into the lungs 4 (four) times daily as needed (sob and wheezing).)  . amLODipine (NORVASC) 10 MG tablet TAKE (1) TABLET DAILY FOR HIGH BLOOD PRESSURE.  [name] BREO ELLIPTA 200-25 MCG/INH AEPB INHALE 1 PUFF INTO THE LUNGS ONCE DAILY.  [name] Cholecalciferol (VITAMIN D3 PO) Take 1 tablet by mouth daily.  . clorazepate (TRANXENE) 7.5 MG tablet TAKE (1) TABLET TWICE DAILY AS NEEDED FOR ANXIETY.  [name] doxepin (SINEQUAN) 10 MG capsule TAKE 1 CAPSULE AT BEDTIME AS NEEDED.  [name] esomeprazole (NEXIUM) 40 MG capsule TAKE 1 CAPSULE DAILY.  . folic acid (FOLVITE) 1 MG tablet TAKE 1 TABLET ONCE DAILY.  . furosemide (LASIX) 20 MG tablet TAKE 1 TABLET DAILY IF NEEDED FOR SIGNIFICANT LOWER EXTREMITY SWELLING.  . gabapentin (NEURONTIN) 100 MG capsule TAKE (1) CAPSULE THREE TIMES DAILY.  [name] levothyroxine (SYNTHROID) 25 MCG tablet TAKE 1 TABLET DAILY BEFORE BREAKFAST.  [name] losartan (COZAAR) 25 MG tablet TAKE (1) TABLET DAILY FOR HIGH BLOOD PRESSURE.  . MYRBETRIQ 25 MG TB24 tablet TAKE 1 TABLET ONCE DAILY.  . [EXPIRED] Na Sulfate-K Sulfate-Mg Sulf 17.5-3.13-1.6 GM/177ML SOLN Take 1 kit by mouth  once for 1 dose.  . oxybutynin (DITROPAN-XL) 5 MG 24 hr tablet TAKE 1 TABLET IN THE MORNING FOR BLADDER CONTROL.  [name] potassium chloride SA (KLOR-CON) 20 MEQ tablet TAKE 1 TABLET ONCE DAILY.  [name] pregabalin (LYRICA) 25 MG capsule TAKE (1) CAPSULE TWICE DAILY.  [name] senna-docusate (SENOKOT-S) 8.6-50 MG tablet Take 2 tablets by mouth at bedtime. (Patient taking differently: Take 2 tablets by  mouth at bedtime as needed (constipation).)  . sertraline (ZOLOFT) 50 MG tablet TAKE 1 TABLET EACH DAY.  . traMADol (ULTRAM) 50 MG tablet TAKE ONE TABLET IN THE MORNING, ONE TABLET AT NOON, AND 1 TABLET IN THE EVENING.  . VITAMIN D-1000 MAX ST 25 MCG (1000 UT) tablet Take 1,000 Units by mouth daily.  . [DISCONTINUED] promethazine (PHENERGAN) 25 MG tablet TAKE 1 TABLET EVERY 8 HOURS AS NEEDED FOR NAUSEA & VOMITING.   No facility-administered encounter medications on file as of 04/11/2021.   Allergies  Allergen Reactions  . Tylenol [Acetaminophen] Other (See Comments)    Nightmares  . Symbicort [Budesonide-Formoterol Fumarate] Other (See Comments)    Pt felt like her tongue was swollen, could not swallow   Patient Active Problem List   Diagnosis Date Noted  . Iron deficiency anemia 04/10/2021  . Goals of care, counseling/discussion 10/10/2020  . PHN (postherpetic neuralgia) 05/09/2020  . Syncope and collapse 02/03/2020  . Fall 04/22/2019  . Elevated CK 04/22/2019  . Leukocytosis 04/22/2019  . Malignant neoplasm of lower lobe of right lung ([place]) 11/16/2018  . Dehydration   . Abdominal pain 02/27/2018  . Colitis 02/27/2018  . SBO (small bowel obstruction) ([place]) 02/27/2018  . Hypokalemia 02/19/2018  . Oral thrush 01/29/2018  . Malnutrition of moderate degree 01/29/2018  . COPD with acute exacerbation ([place]) 01/28/2018  . COPD with emphysema ([place]) 06/26/2017  . COPD exacerbation ([place]) 05/29/2016  . Hypoxia 05/29/2016  . Coronary atherosclerosis 07/24/2015  . Atherosclerosis of aorta ([place]) 07/24/2015  . Dysphagia 07/24/2015  . Acute respiratory failure with hypoxia ([place]) 07/23/2015  . Bronchospasm with bronchitis, acute 07/23/2015  . HIP PAIN, LEFT 05/16/2010  . LOW BACK PAIN, CHRONIC 10/01/2009  . PARESTHESIA 10/01/2009  . Hypothyroidism 07/23/2009  . Dyslipidemia 07/23/2009  . Anxiety 07/23/2009  . Anxiety and depression 07/23/2009  . Essential hypertension 07/23/2009  . GERD  07/23/2009  . DIVERTICULOSIS, COLON 07/23/2009  . HEADACHE 07/23/2009   Social History   Socioeconomic History  . Marital status: Widowed    Spouse name: Not on file  . Number of children: 1  . Years of education: Not on file  . Highest education level: Not on file  Occupational History  . Not on file  Tobacco Use  . Smoking status: Former Smoker    Packs/day: 0.50    Types: Cigarettes    Start date: 07/16/1960    Quit date: 07/16/1998    Years since quitting: 22.7  . Smokeless tobacco: Never Used  Vaping Use  . Vaping Use: Never used  Substance and Sexual Activity  . Alcohol use: No    Alcohol/week: 0.0 standard drinks  . Drug use: No  . Sexual activity: Never  Other Topics Concern  . Not on file  Social History Narrative   Widowed.  Lives alone in her own home.  Ambulates with a cane/walker when needed.   Social Determinants of Health   Financial Resource Strain: Not on file  Food Insecurity: Not on file  Transportation Needs: Not on file  Physical Activity: Not on file  Stress: Not on file  Social Connections: Not on file  Intimate Partner Violence: Not on file    Heather [name]'s family history includes Cancer in her mother and sister.      Objective:    Vitals:   04/11/21 1102  BP: 118/62  Pulse: 85    Physical Exam Well-developed well-nourished elderly white female in no acute distress.  Height, Weight, 120 BMI 24.2  HEENT; nontraumatic normocephalic, EOMI, PE R LA, sclera anicteric. Oropharynx; not examined today Neck; supple, no JVD Cardiovascular; regular rate and rhythm with S1-S2, no murmur rub or gallop Pulmonary; Clear bilaterally, somewhat decreased breath sounds Abdomen; soft, protuberant , there is some mild tenderness in the left lower quadrant nondistended, no palpable mass or hepatosplenomegaly, bowel sounds are active Rectal; not done today Skin; benign exam, no jaundice rash or appreciable lesions Extremities; no clubbing cyanosis or  edema skin warm and dry Neuro/Psych; alert and oriented x4, grossly nonfocal mood and affect appropriate       Assessment & Plan:   #41 86 year old white female with iron deficiency anemia, and complaints of constipation and chronic GERD, upper abdominal discomfort with bloating Previous history of collagenous colitis remote with negative colonoscopy 2014 per Dr. [name] the exception of scattered diverticulosis and a hyperplastic polyp.  EGD at that same time was unremarkable.  Duodenal biopsies negative  Etiology of iron deficiency is not clear rule out occult upper versus lower GI blood loss  #2 history of lung cancer status post SBRT 2021 3.  COPD no oxygen use 4.  Hypertension 5.  Coronary artery disease  Plan; patient will be scheduled for EGD and colonoscopy with Dr. Lyndel Safe.  Both procedures were discussed in detail with the patient and her son including indications risks and benefits and she is agreeable to proceed. She is starting iron injections per oncology, with plans for office follow-up with him in 3 months Continue Nexium 40 mg p.o. every morning AC breakfast Increase Dulcolax to 3 to 4 tablets daily.  Advised increasing water intake to 60 ounces daily, and add 4-5 prunes or a large glass of prune juice daily.  Senan Urey S Ahtziry Saathoff PA-C 04/14/2021   Cc: Marrian Salvage,*

## 2021-04-15 LAB — PROTEIN ELECTROPHORESIS, SERUM, WITH REFLEX
A/G Ratio: 1.2 (ref 0.7–1.7)
Albumin ELP: 3.9 g/dL (ref 2.9–4.4)
Alpha-1-Globulin: 0.3 g/dL (ref 0.0–0.4)
Alpha-2-Globulin: 0.8 g/dL (ref 0.4–1.0)
Beta Globulin: 1.2 g/dL (ref 0.7–1.3)
Gamma Globulin: 0.9 g/dL (ref 0.4–1.8)
Globulin, Total: 3.2 g/dL (ref 2.2–3.9)
M-Spike, %: 0.2 g/dL — ABNORMAL HIGH
SPEP Interpretation: 0
Total Protein ELP: 7.1 g/dL (ref 6.0–8.5)

## 2021-04-15 LAB — IMMUNOFIXATION REFLEX, SERUM
IgA: 256 mg/dL (ref 64–422)
IgG (Immunoglobin G), Serum: 846 mg/dL (ref 586–1602)
IgM (Immunoglobulin M), Srm: 172 mg/dL (ref 26–217)

## 2021-04-18 ENCOUNTER — Other Ambulatory Visit: Payer: Self-pay | Admitting: Physician Assistant

## 2021-04-18 DIAGNOSIS — R778 Other specified abnormalities of plasma proteins: Secondary | ICD-10-CM

## 2021-04-18 NOTE — Progress Notes (Signed)
I reviewed her SPEP with [facility] with Dr. Julien Nordmann. 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 I will arrange for the rest of the myeloma panel to be drawn at her next lab visit.

## 2021-04-22 ENCOUNTER — Telehealth: Payer: Self-pay | Admitting: Family

## 2021-04-22 NOTE — Progress Notes (Signed)
  Chronic Care Management   Note  04/22/2021 Name: Heather [name] MRN: [id number] DOB: Apr 25, 1935  Heather [name] is a 86 y.o. year old female who is a primary care patient of Marrian Salvage, [place]. I reached out to [name] by phone today in response to a referral sent by [name] PCP, Marrian Salvage, FNP.   [name] was given information about Chronic Care Management services today including:  1. CCM service includes personalized support from designated clinical staff supervised by her physician, including individualized plan of care and coordination with other care providers 2. 24/7 contact phone numbers for assistance for urgent and routine care needs. 3. Service will only be billed when office clinical staff spend 20 minutes or more in a month to coordinate care. 4. Only one practitioner may furnish and bill the service in a calendar month. 5. The patient may stop CCM services at any time (effective at the end of the month) by phone call to the office staff.   Patient agreed to services and verbal consent obtained.   Follow up plan:   Carley Perdue [employer] Scheduler

## 2021-04-23 ENCOUNTER — Telehealth: Payer: Self-pay | Admitting: Internal Medicine

## 2021-04-23 ENCOUNTER — Telehealth: Payer: Self-pay | Admitting: *Deleted

## 2021-04-23 NOTE — Telephone Encounter (Signed)
Scheduled appts per 5/16 sch msg. Called pt's son per msg. No answer. Left msg with appts dates and times. Left him know in the msg we are still working on getting her second iron infusion scheduled on 5/27 and I will call him back once it is scheduled.

## 2021-04-23 NOTE — Telephone Encounter (Signed)
Left a voicemail for the patient's son [name] to let him know that his moms upcoming appointment on 06/18/2021 will be a telephone call to review results from CT scan.  Enid Derry will call him with date and time of CT scan.  Left my call back number [phone]) in case he has further questions or concerns.  [name]. [name], BSN

## 2021-04-24 ENCOUNTER — Telehealth: Payer: Self-pay | Admitting: Internal Medicine

## 2021-04-24 NOTE — Telephone Encounter (Signed)
Scheduled appts per 5/16 sch msg. Pt's son is aware.

## 2021-04-26 ENCOUNTER — Inpatient Hospital Stay: Payer: Medicare Other

## 2021-04-26 ENCOUNTER — Other Ambulatory Visit: Payer: Self-pay

## 2021-04-26 VITALS — BP 128/57 | HR 80 | Temp 98.2°F | Resp 17

## 2021-04-26 DIAGNOSIS — D509 Iron deficiency anemia, unspecified: Secondary | ICD-10-CM | POA: Diagnosis not present

## 2021-04-26 DIAGNOSIS — Z87891 Personal history of nicotine dependence: Secondary | ICD-10-CM | POA: Diagnosis not present

## 2021-04-26 DIAGNOSIS — K5900 Constipation, unspecified: Secondary | ICD-10-CM | POA: Diagnosis not present

## 2021-04-26 DIAGNOSIS — Z79899 Other long term (current) drug therapy: Secondary | ICD-10-CM | POA: Diagnosis not present

## 2021-04-26 DIAGNOSIS — K219 Gastro-esophageal reflux disease without esophagitis: Secondary | ICD-10-CM | POA: Diagnosis not present

## 2021-04-26 DIAGNOSIS — I2510 Atherosclerotic heart disease of native coronary artery without angina pectoris: Secondary | ICD-10-CM | POA: Diagnosis not present

## 2021-04-26 DIAGNOSIS — J449 Chronic obstructive pulmonary disease, unspecified: Secondary | ICD-10-CM | POA: Diagnosis not present

## 2021-04-26 DIAGNOSIS — E039 Hypothyroidism, unspecified: Secondary | ICD-10-CM | POA: Diagnosis not present

## 2021-04-26 DIAGNOSIS — I10 Essential (primary) hypertension: Secondary | ICD-10-CM | POA: Diagnosis not present

## 2021-04-26 MED ORDER — DIPHENHYDRAMINE HCL 25 MG PO CAPS
ORAL_CAPSULE | ORAL | Status: AC
  Filled 2021-04-26: qty 1

## 2021-04-26 MED ORDER — SODIUM CHLORIDE 0.9 % IV SOLN
300.0000 mg | Freq: Once | INTRAVENOUS | Status: AC
  Administered 2021-04-26: 300 mg via INTRAVENOUS
  Filled 2021-04-26: qty 300

## 2021-04-26 MED ORDER — DIPHENHYDRAMINE HCL 25 MG PO CAPS
25.0000 mg | ORAL_CAPSULE | Freq: Once | ORAL | Status: AC
  Administered 2021-04-26: 25 mg via ORAL

## 2021-04-26 MED ORDER — ACETAMINOPHEN 325 MG PO TABS: 650.0000 mg | ORAL_TABLET | Freq: Once | ORAL | Status: DC

## 2021-04-26 MED ORDER — SODIUM CHLORIDE 0.9 % IV SOLN
Freq: Once | INTRAVENOUS | Status: AC
  Filled 2021-04-26: qty 250

## 2021-04-26 NOTE — Patient Instructions (Signed)

## 2021-05-03 ENCOUNTER — Inpatient Hospital Stay: Payer: Medicare Other

## 2021-05-03 ENCOUNTER — Other Ambulatory Visit: Payer: Self-pay

## 2021-05-03 VITALS — BP 123/57 | HR 84 | Temp 98.5°F | Resp 18

## 2021-05-03 DIAGNOSIS — D509 Iron deficiency anemia, unspecified: Secondary | ICD-10-CM | POA: Diagnosis not present

## 2021-05-03 DIAGNOSIS — Z79899 Other long term (current) drug therapy: Secondary | ICD-10-CM | POA: Diagnosis not present

## 2021-05-03 DIAGNOSIS — I2510 Atherosclerotic heart disease of native coronary artery without angina pectoris: Secondary | ICD-10-CM | POA: Diagnosis not present

## 2021-05-03 DIAGNOSIS — I10 Essential (primary) hypertension: Secondary | ICD-10-CM | POA: Diagnosis not present

## 2021-05-03 DIAGNOSIS — J449 Chronic obstructive pulmonary disease, unspecified: Secondary | ICD-10-CM | POA: Diagnosis not present

## 2021-05-03 DIAGNOSIS — K219 Gastro-esophageal reflux disease without esophagitis: Secondary | ICD-10-CM | POA: Diagnosis not present

## 2021-05-03 DIAGNOSIS — Z87891 Personal history of nicotine dependence: Secondary | ICD-10-CM | POA: Diagnosis not present

## 2021-05-03 DIAGNOSIS — E039 Hypothyroidism, unspecified: Secondary | ICD-10-CM | POA: Diagnosis not present

## 2021-05-03 DIAGNOSIS — K5900 Constipation, unspecified: Secondary | ICD-10-CM | POA: Diagnosis not present

## 2021-05-03 MED ORDER — DIPHENHYDRAMINE HCL 25 MG PO CAPS
ORAL_CAPSULE | ORAL | Status: AC
  Filled 2021-05-03: qty 1

## 2021-05-03 MED ORDER — DIPHENHYDRAMINE HCL 25 MG PO CAPS
25.0000 mg | ORAL_CAPSULE | Freq: Once | ORAL | Status: AC
  Administered 2021-05-03: 25 mg via ORAL

## 2021-05-03 MED ORDER — SODIUM CHLORIDE 0.9 % IV SOLN
300.0000 mg | Freq: Once | INTRAVENOUS | Status: AC
  Administered 2021-05-03: 300 mg via INTRAVENOUS
  Filled 2021-05-03: qty 300

## 2021-05-03 MED ORDER — ACETAMINOPHEN 325 MG PO TABS: 650.0000 mg | ORAL_TABLET | Freq: Once | ORAL | Status: DC

## 2021-05-03 MED ORDER — SODIUM CHLORIDE 0.9 % IV SOLN
Freq: Once | INTRAVENOUS | Status: AC
  Filled 2021-05-03: qty 250

## 2021-05-03 NOTE — Patient Instructions (Signed)

## 2021-05-08 NOTE — Progress Notes (Signed)
Agree with assessment/plan RG 

## 2021-05-10 ENCOUNTER — Other Ambulatory Visit: Payer: Self-pay

## 2021-05-10 ENCOUNTER — Inpatient Hospital Stay: Payer: Medicare Other | Attending: Physician Assistant

## 2021-05-10 VITALS — BP 130/51 | HR 82 | Temp 97.9°F | Resp 18

## 2021-05-10 DIAGNOSIS — D509 Iron deficiency anemia, unspecified: Secondary | ICD-10-CM | POA: Diagnosis not present

## 2021-05-10 DIAGNOSIS — Z79899 Other long term (current) drug therapy: Secondary | ICD-10-CM | POA: Diagnosis not present

## 2021-05-10 MED ORDER — ACETAMINOPHEN 325 MG PO TABS: 650.0000 mg | ORAL_TABLET | Freq: Once | ORAL | Status: DC

## 2021-05-10 MED ORDER — SODIUM CHLORIDE 0.9 % IV SOLN
300.0000 mg | Freq: Once | INTRAVENOUS | Status: AC
  Administered 2021-05-10: 300 mg via INTRAVENOUS
  Filled 2021-05-10: qty 300

## 2021-05-10 MED ORDER — DIPHENHYDRAMINE HCL 25 MG PO CAPS
25.0000 mg | ORAL_CAPSULE | Freq: Once | ORAL | Status: AC
  Administered 2021-05-10: 25 mg via ORAL

## 2021-05-10 MED ORDER — SODIUM CHLORIDE 0.9 % IV SOLN
Freq: Once | INTRAVENOUS | Status: AC
  Filled 2021-05-10: qty 250

## 2021-05-10 MED ORDER — DIPHENHYDRAMINE HCL 25 MG PO CAPS
ORAL_CAPSULE | ORAL | Status: AC
  Filled 2021-05-10: qty 1

## 2021-05-10 NOTE — Patient Instructions (Signed)

## 2021-05-13 ENCOUNTER — Other Ambulatory Visit: Payer: Self-pay | Admitting: Family

## 2021-05-22 ENCOUNTER — Encounter: Payer: Self-pay | Admitting: Physician Assistant

## 2021-05-28 ENCOUNTER — Encounter: Payer: Self-pay | Admitting: Gastroenterology

## 2021-05-28 ENCOUNTER — Other Ambulatory Visit: Payer: Self-pay

## 2021-05-28 ENCOUNTER — Ambulatory Visit (AMBULATORY_SURGERY_CENTER): Payer: Medicare Other | Admitting: Gastroenterology

## 2021-05-28 VITALS — BP 138/69 | HR 92 | Temp 96.9°F | Resp 12 | Ht 59.0 in | Wt 120.0 lb

## 2021-05-28 DIAGNOSIS — Z1211 Encounter for screening for malignant neoplasm of colon: Secondary | ICD-10-CM | POA: Diagnosis not present

## 2021-05-28 DIAGNOSIS — K2950 Unspecified chronic gastritis without bleeding: Secondary | ICD-10-CM | POA: Diagnosis not present

## 2021-05-28 DIAGNOSIS — K5730 Diverticulosis of large intestine without perforation or abscess without bleeding: Secondary | ICD-10-CM

## 2021-05-28 DIAGNOSIS — K5900 Constipation, unspecified: Secondary | ICD-10-CM

## 2021-05-28 DIAGNOSIS — K2970 Gastritis, unspecified, without bleeding: Secondary | ICD-10-CM

## 2021-05-28 DIAGNOSIS — Z538 Procedure and treatment not carried out for other reasons: Secondary | ICD-10-CM

## 2021-05-28 DIAGNOSIS — D509 Iron deficiency anemia, unspecified: Secondary | ICD-10-CM | POA: Diagnosis not present

## 2021-05-28 DIAGNOSIS — R140 Abdominal distension (gaseous): Secondary | ICD-10-CM

## 2021-05-28 DIAGNOSIS — K219 Gastro-esophageal reflux disease without esophagitis: Secondary | ICD-10-CM

## 2021-05-28 MED ORDER — SODIUM CHLORIDE 0.9 % IV SOLN: 500.0000 mL | Freq: Once | INTRAVENOUS | Status: DC

## 2021-05-28 NOTE — Patient Instructions (Addendum)
YOU HAD AN ENDOSCOPIC PROCEDURE TODAY AT [facility]:   Refer to the procedure report that was given to you for any specific questions about what was found during the examination.  If the procedure report does not answer your questions, please call your gastroenterologist to clarify.  If you requested that your care partner not be given the details of your procedure findings, then the procedure report has been included in a sealed envelope for you to review at your convenience later.  YOU SHOULD EXPECT: Some feelings of bloating in the abdomen. Passage of more gas than usual.  Walking can help get rid of the air that was put into your GI tract during the procedure and reduce the bloating. If you had a lower endoscopy (such as a colonoscopy or flexible sigmoidoscopy) you may notice spotting of blood in your stool or on the toilet paper. If you underwent a bowel prep for your procedure, you may not have a normal bowel movement for a few days.  Please Note:  You might notice some irritation and congestion in your nose or some drainage.  This is from the oxygen used during your procedure.  There is no need for concern and it should clear up in a day or so.  SYMPTOMS TO REPORT IMMEDIATELY:  Following lower endoscopy (colonoscopy or flexible sigmoidoscopy):  Excessive amounts of blood in the stool  Significant tenderness or worsening of abdominal pains  Swelling of the abdomen that is new, acute  Fever of 100F or higher  Following upper endoscopy (EGD)  Vomiting of blood or coffee ground material  New chest pain or pain under the shoulder blades  Painful or persistently difficult swallowing  New shortness of breath  Fever of 100F or higher  Black, tarry-looking stools  For urgent or emergent issues, a gastroenterologist can be reached at any hour by calling [phone]. Do not use MyChart messaging for urgent concerns.    DIET:  We do recommend a small meal at first, but  then you may proceed to your regular diet.  Drink plenty of fluids but you should avoid alcoholic beverages for 24 hours.  MEDICATIONS: Continue present medications.  Repeat colonoscopy on Tuesday, Jul 16, 2021 at 2:30 pm (arrive at 1:30 pm) with a two day prep (insufficient prep this visit). You also have a pre-visit schedule with one of our nurses on Wednesday, June 26, 2021 at 2:30 pm. to go over instructions with a two day prep this time.  Please see handouts given to you by your recovery nurse.  Thank you for allowing [place] to provide for your healthcare needs today.  ACTIVITY:  You should plan to take it easy for the rest of today and you should NOT DRIVE or use heavy machinery until tomorrow (because of the sedation medicines used during the test).    FOLLOW UP: Our staff will call the number listed on your records 48-72 hours following your procedure to check on you and address any questions or concerns that you may have regarding the information given to you following your procedure. If we do not reach you, we will leave a message.  We will attempt to reach you two times.  During this call, we will ask if you have developed any symptoms of COVID 19. If you develop any symptoms (ie: fever, flu-like symptoms, shortness of breath, cough etc.) before then, please call [phone].  If you test positive for Covid 19 in the 2 weeks post procedure, please  call and report this information to [place].    If any biopsies were taken you will be contacted by phone or by letter within the next 1-3 weeks.  Please call us at [phone] if you have not heard about the biopsies in 3 weeks.    SIGNATURES/CONFIDENTIALITY: You and/or your care partner have signed paperwork which will be entered into your electronic medical record.  These signatures attest to the fact that that the information above on your After Visit Summary has been reviewed and is understood.  Full responsibility of the confidentiality of  this discharge information lies with you and/or your care-partner.

## 2021-05-28 NOTE — Progress Notes (Signed)
Called to room to assist during endoscopic procedure.  Patient ID and intended procedure confirmed with present staff. Received instructions for my participation in the procedure from the performing physician.  

## 2021-05-28 NOTE — Op Note (Signed)
[facility] Patient Name: Heather [name] Procedure Date: 05/28/2021 2:34 PM MRN: [id number] Endoscopist: Jackquline Denmark , MD Age: 86 Referring MD:  Date of Birth: 03/20/1935 Gender: Female Account #: [id number] Procedure:                Colonoscopy Indications:              Iron deficiency anemia Medicines:                Monitored Anesthesia Care Procedure:                Pre-Anesthesia Assessment:                           - Prior to the procedure, a History and Physical                            was performed, and patient medications and                            allergies were reviewed. The patient's tolerance of                            previous anesthesia was also reviewed. The risks                            and benefits of the procedure and the sedation                            options and risks were discussed with the patient.                            All questions were answered, and informed consent                            was obtained. Prior Anticoagulants: The patient has                            taken no previous anticoagulant or antiplatelet                            agents. ASA Grade Assessment: III - A patient with                            severe systemic disease. After reviewing the risks                            and benefits, the patient was deemed in                            satisfactory condition to undergo the procedure.                           After obtaining informed consent, the colonoscope  was passed under direct vision. Throughout the                            procedure, the patient's blood pressure, pulse, and                            oxygen saturations were monitored continuously. The                            Olympus PCF-[id number] (#[id number]) Colonoscope was                            introduced through the anus and advanced to the the                            descending colon. The colonoscopy  was somewhat                            difficult due to inadequate bowel prep. The patient                            tolerated the procedure well. The quality of the                            bowel preparation was unsatisfactory. Scope In: 2:51:14 PM Scope Out: 2:55:43 PM Total Procedure Duration: 0 hours 4 minutes 29 seconds  Findings:                 A few medium-mouthed diverticula were found in the                            sigmoid colon. Solid stool was encountered in the                            sigmoid colon. The quality of preparation got                            progressively worse. Complications:            No immediate complications. Estimated Blood Loss:     Estimated blood loss: none. Impression:               - Preparation of the colon was unsatisfactory.                           - Diverticulosis in the sigmoid colon.                           - No specimens collected. Recommendation:           - Patient has a contact number available for                            emergencies. The signs and symptoms of potential  delayed complications were discussed with the                            patient. Return to normal activities tomorrow.                            Written discharge instructions were provided to the                            patient.                           - Resume previous diet.                           - Continue present medications.                           - Repeat colonoscopy in 2-4 weeks because the                            examination was incomplete with 2-day prep.                           - The findings and recommendations were discussed                            with the patient's family. Jackquline Denmark, MD 05/28/2021 3:04:55 PM This report has been signed electronically.

## 2021-05-28 NOTE — Op Note (Signed)
[facility] Patient Name: Heather [name] Procedure Date: 05/28/2021 2:35 PM MRN: [id number] Endoscopist: Jackquline Denmark , MD Age: 86 Referring MD:  Date of Birth: 1935-06-03 Gender: Female Account #: [id number] Procedure:                Upper GI endoscopy Indications:              Iron deficiency anemia Medicines:                Monitored Anesthesia Care Procedure:                Pre-Anesthesia Assessment:                           - Prior to the procedure, a History and Physical                            was performed, and patient medications and                            allergies were reviewed. The patient's tolerance of                            previous anesthesia was also reviewed. The risks                            and benefits of the procedure and the sedation                            options and risks were discussed with the patient.                            All questions were answered, and informed consent                            was obtained. Prior Anticoagulants: The patient has                            taken no previous anticoagulant or antiplatelet                            agents. ASA Grade Assessment: III - A patient with                            severe systemic disease. After reviewing the risks                            and benefits, the patient was deemed in                            satisfactory condition to undergo the procedure.                           After obtaining informed consent, the endoscope was  passed under direct vision. Throughout the                            procedure, the patient's blood pressure, pulse, and                            oxygen saturations were monitored continuously. The                            Endoscope was introduced through the mouth, and                            advanced to the second part of duodenum. The upper                            GI endoscopy was  accomplished without difficulty.                            The patient tolerated the procedure well. Scope In: Scope Out: Findings:                 The examined esophagus was mildly tortuous s/o                            presbyesophagus.                           Diffuse moderate inflammation characterized by                            erythema and granularity was found in the entire                            examined stomach. Biopsies were taken with a cold                            forceps for histology from throughout the stomach                            [name] protocol).                           The examined duodenum was normal. Biopsies for                            histology were taken with a cold forceps for                            evaluation of celiac disease. Complications:            No immediate complications. Estimated Blood Loss:     Estimated blood loss: none. Impression:               - Atrophic gastritis. Biopsied. Recommendation:           - Patient has a contact number available for  emergencies. The signs and symptoms of potential                            delayed complications were discussed with the                            patient. Return to normal activities tomorrow.                            Written discharge instructions were provided to the                            patient.                           - Resume previous diet.                           - Continue present medications.                           - Await pathology results.                           - The findings and recommendations were discussed                            with the patient's family. Jackquline Denmark, MD 05/28/2021 3:01:58 PM This report has been signed electronically.

## 2021-05-28 NOTE — Progress Notes (Signed)
A and O x3. Report to RN. Tolerated MAC anesthesia well.Teeth unchanged after procedure. 

## 2021-05-28 NOTE — Progress Notes (Signed)
Pt's states no medical or surgical changes since previsit or office visit. 

## 2021-05-29 ENCOUNTER — Telehealth: Payer: Self-pay | Admitting: Pharmacist

## 2021-05-29 NOTE — Progress Notes (Signed)
Chronic Care Management Pharmacy Assistant   Name: Heather [name]  MRN: [id number] DOB: 1935-05-12  Heather [name] is an 86 y.o. year old female who presents for his initial CCM visit with the clinical pharmacist.  Reason for Encounter: Initial Questions for CPP visit    Recent office visits:  03/25/21 [name] FNP Internal Medicine  Recent consult visits:  01/30/21 Dr. Jinger Neighbors Phys Med 04/10/21 Cassandra Heilingotetter PA-C Oncology 04/11/21 [facility] visits:  None in previous 6 months  Medications: Outpatient Encounter Medications as of 05/29/2021  Medication Sig Note   Albuterol Sulfate (PROAIR RESPICLICK) 924 (90 Base) MCG/ACT AEPB Inhale 2 puffs into the lungs 4 (four) times daily as needed. (Patient taking differently: Inhale 2 puffs into the lungs 4 (four) times daily as needed (sob and wheezing).)    amLODipine (NORVASC) 10 MG tablet TAKE (1) TABLET DAILY FOR HIGH BLOOD PRESSURE.    BREO ELLIPTA 200-25 MCG/INH AEPB INHALE 1 PUFF INTO THE LUNGS ONCE DAILY.    Cholecalciferol (VITAMIN D3 PO) Take 1 tablet by mouth daily.    clorazepate (TRANXENE) 7.5 MG tablet TAKE (1) TABLET TWICE DAILY AS NEEDED FOR ANXIETY.    doxepin (SINEQUAN) 10 MG capsule TAKE 1 CAPSULE AT BEDTIME AS NEEDED.    esomeprazole (NEXIUM) 40 MG capsule TAKE 1 CAPSULE DAILY.    folic acid (FOLVITE) 1 MG tablet TAKE 1 TABLET ONCE DAILY.    furosemide (LASIX) 20 MG tablet TAKE 1 TABLET DAILY IF NEEDED FOR SIGNIFICANT LOWER EXTREMITY SWELLING.    gabapentin (NEURONTIN) 100 MG capsule TAKE (1) CAPSULE THREE TIMES DAILY.    levothyroxine (SYNTHROID) 25 MCG tablet TAKE 1 TABLET DAILY BEFORE BREAKFAST.    losartan (COZAAR) 25 MG tablet TAKE (1) TABLET DAILY FOR HIGH BLOOD PRESSURE.    MYRBETRIQ 25 MG TB24 tablet TAKE 1 TABLET ONCE DAILY.    oxybutynin (DITROPAN-XL) 5 MG 24 hr tablet TAKE 1 TABLET IN THE MORNING FOR BLADDER CONTROL.    potassium chloride SA (KLOR-CON) 20  MEQ tablet TAKE 1 TABLET ONCE DAILY.    pregabalin (LYRICA) 25 MG capsule TAKE (1) CAPSULE TWICE DAILY.    promethazine (PHENERGAN) 25 MG tablet TAKE 1 TABLET EVERY 8 HOURS AS NEEDED FOR NAUSEA & VOMITING.    senna-docusate (SENOKOT-S) 8.6-50 MG tablet Take 2 tablets by mouth at bedtime. (Patient not taking: Reported on 05/28/2021)    sertraline (ZOLOFT) 50 MG tablet TAKE 1 TABLET EACH DAY.    traMADol (ULTRAM) 50 MG tablet TAKE ONE TABLET IN THE MORNING, ONE TABLET AT NOON, AND 1 TABLET IN THE EVENING. 01/30/2021: Last dose: 01/30/2021   VITAMIN D-1000 MAX ST 25 MCG (1000 UT) tablet Take 1,000 Units by mouth daily.    Facility-Administered Encounter Medications as of 05/29/2021  Medication   0.9 %  sodium chloride infusion    Pharmacist Review  Have you seen any other providers since your last visit? Patient has seen other providers in the last 6 months  Any changes in your medications or health? Patient states that she has not had any changes in her medications but has had some health issues that she is dealing with  Any side effects from any medications? No, patient states that she is not having any side effects from any medications  Do you have an symptoms or problems not managed by your medications? No, patient states that she doe not have any symptoms or problems that are not managed by medications  Any concerns about your health right now? Yes, patient states that she is having some issues with constipation and that it is getting worse. She states that she takes dulcolax. Also she states that she is having really bad pain in her left hip  Has your provider asked that you check blood pressure, blood sugar, or follow special diet at home? Patient states that every once in a while she takes her blood pressure if she is feeling dizzy or lightheaded. She does not have to check blood sugars, and does not follow any special diet, she eats what she wants  Do you get any type of exercise on a  regular basis? Yes, patient states that she does exercise every day at her kitchen sink with a list of standing exercises at least 2 to 3 times a day  Can you think of a goal you would like to reach for your health? Patient states that her health goal would be to be pain free  Do you have any problems getting your medications? No, patient states she has no problems with getting her medications or the cost of the medications from the pharmacy. She states that her pharmacy delivers her medications on Tuesdays and Thursdays.  Is there anything that you would like to discuss during the appointment? Patient states that she is so sleep deprived and only sleeps about 3 to 4 hours a night. She takes a sleeping pill but it does not seem to help. Also patient states that she is a little confused about some of her medications if she should be taking them or not   Please bring medications and supplements to appointment   Star Rating Drugs: Losartan 03/12/21 90 ds  [place] Pharmacist Assistant [phone]   Time spent:60

## 2021-05-30 ENCOUNTER — Telehealth: Payer: Self-pay | Admitting: Physician Assistant

## 2021-05-30 ENCOUNTER — Telehealth: Payer: Medicare Other

## 2021-05-30 ENCOUNTER — Telehealth: Payer: Self-pay

## 2021-05-30 NOTE — Telephone Encounter (Signed)
  Follow up Call-  Call back number 05/28/2021  Post procedure Call Back phone  # [phone]  Permission to leave phone message Yes  Some recent data might be hidden     Patient questions:  Do you have a fever, pain , or abdominal swelling? No. Pain Score  0 *  Have you tolerated food without any problems? Yes.    Have you been able to return to your normal activities? Yes.    Do you have any questions about your discharge instructions: Diet   No. Medications  No. Follow up visit  No.  Do you have questions or concerns about your Care? No.  Actions: * If pain score is 4 or above: No action needed, pain <4.

## 2021-05-30 NOTE — Progress Notes (Deleted)
Chronic Care Management Pharmacy Note  05/30/2021 Name:  Heather [name] MRN:  [id number] DOB:  09-14-1935  Summary:   Recommendations/Changes made from today's visit:   Plan:    Subjective: Heather [name] is an 86 y.o. year old female who is a primary patient of Heather [name], [place].  The CCM team was consulted for assistance with disease management and care coordination needs.    Engaged with patient by telephone for initial visit in response to provider referral for pharmacy case management and/or care coordination services.   Consent to Services:  The patient was given the following information about Chronic Care Management services today, agreed to services, and gave verbal consent: 1. CCM service includes personalized support from designated clinical staff supervised by the primary care provider, including individualized plan of care and coordination with other care providers 2. 24/7 contact phone numbers for assistance for urgent and routine care needs. 3. Service will only be billed when office clinical staff spend 20 minutes or more in a month to coordinate care. 4. Only one practitioner may furnish and bill the service in a calendar month. 5.The patient may stop CCM services at any time (effective at the end of the month) by phone call to the office staff. 6. The patient will be responsible for cost sharing (co-pay) of up to 20% of the service fee (after annual deductible is met). Patient agreed to services and consent obtained.  Patient Care Team: Heather [name], [place] as PCP - General (Internal Medicine) Heather Coder, MD as Consulting Physician (Pulmonary Disease) Heather [name], [facility] as Pharmacist (Pharmacist)  Recent office visits: 03/25/21 Heather Mourning FNP OV: c/o abdominal bloating, N/V. Ordered CT, referred to GI.  Recent consult visits: 05/28/21 endoscopy 05/10/21, 05/03/21, 04/26/21 - iron infusion (iron sucrose 300 mg)  04/11/21 PA Heather  [name] (GI) - f/u iron deficiency anemia, constipation. IDA etiology unclear, need to r/o GI bleeding. Plan EGD/cscope  04/10/21 Heather Heilingotetter PA-C (Heme/onc): eval anemia; hx NSCLC s/o SBRT 2021; plan to start iron infusions since pt is fearful of oral iron d/t constipation. Repeat labs 2 months.  01/30/21 Dr. Jinger [name] (Phys Med): f/u spinal stenosis, paresthesias in feet. Pain 8/10 w/ Lyrica 25 BID and Tramadol 50 BID  Hospital visits: None in previous 6 months   Objective:  Lab Results  Component Value Date   CREATININE 0.94 04/10/2021   BUN 9 04/10/2021   GFR 67.97 03/25/2021   GFRNONAA >60 04/10/2021   GFRAA >60 07/31/2020   NA 138 04/10/2021   K 4.1 04/10/2021   CALCIUM 9.4 04/10/2021   CO2 26 04/10/2021   GLUCOSE 101 (H) 04/10/2021    Lab Results  Component Value Date/Time   HGBA1C 5.7 06/21/2019 02:33 PM   GFR 67.97 03/25/2021 02:39 PM   GFR 77.41 03/14/2020 12:35 PM    Last diabetic Eye exam: No results found for: HMDIABEYEEXA  Last diabetic Foot exam: No results found for: HMDIABFOOTEX   Lab Results  Component Value Date   CHOL 234 (H) 01/26/2018   HDL 81.90 01/26/2018   LDLCALC 130 (H) 01/26/2018   LDLDIRECT 118.2 03/29/2013   TRIG 112.0 01/26/2018   CHOLHDL 3 01/26/2018    Hepatic Function Latest Ref Rng & Units 04/10/2021 03/25/2021 10/10/2020  Total Protein 6.5 - 8.1 g/dL 7.6 7.4 7.8  Albumin 3.5 - 5.0 g/dL 4.1 4.0 4.2  AST 15 - 41 U/L _0 ALT 0 - 44 U/L 7 8 9  Alk Phosphatase 38 - 126 U/L 85 82 86  Total Bilirubin 0.3 - 1.2 mg/dL 0.4 0.4 0.5  Bilirubin, Direct 0.0 - 0.3 mg/dL - - -    Lab Results  Component Value Date/Time   TSH 2.18 03/25/2021 02:39 PM   TSH 2.04 03/14/2020 12:35 PM   FREET4 0.83 05/29/2016 04:45 AM   FREET4 1.31 02/18/2011 05:41 PM    CBC Latest Ref Rng & Units 04/10/2021 03/25/2021 10/10/2020  WBC 4.0 - 10.5 K/uL 7.5 7.0 6.2  Hemoglobin 12.0 - 15.0 g/dL 9.5(L) 9.7(L) 10.2(L)  Hematocrit 36.0 - 46.0 %  32.2(L) 31.6(L) 34.6(L)  Platelets 150 - 400 K/uL 379 412.0(H) 337    No results found for: VD25OH  Clinical ASCVD: Yes  The ASCVD Risk score Heather [name] DC Jr., et al., 2013) failed to calculate for the following reasons:   The 2013 ASCVD risk score is only valid for ages 10 to 52    Depression screen PHQ 2/9 03/25/2021 07/27/2020 11/10/2019  Decreased Interest 0 0 3  Down, Depressed, Hopeless 0 0 3  PHQ - 2 Score 0 0 6  Altered sleeping - - 3  Tired, decreased energy - - 3  Change in appetite - - 3  Feeling bad or failure about yourself  - - 0  Trouble concentrating - - 3  Moving slowly or fidgety/restless - - 1  Suicidal thoughts - - 0  PHQ-9 Score - - 19  Difficult doing work/chores - - Very difficult  Some recent data might be hidden      Social History   Tobacco Use  Smoking Status Former   Packs/day: 0.50   Pack years: 0.00   Types: Cigarettes   Start date: 07/16/1960   Quit date: 07/16/1998   Years since quitting: 22.8  Smokeless Tobacco Never   BP Readings from Last 3 Encounters:  05/28/21 138/69  05/10/21 (!) 130/51  05/03/21 (!) 123/57   Pulse Readings from Last 3 Encounters:  05/28/21 92  05/10/21 82  05/03/21 84   Wt Readings from Last 3 Encounters:  05/28/21 120 lb (54.4 kg)  04/11/21 120 lb (54.4 kg)  04/10/21 120 lb 4.8 oz (54.6 kg)   BMI Readings from Last 3 Encounters:  05/28/21 24.24 kg/m  04/11/21 24.24 kg/m  04/10/21 24.30 kg/m    Assessment/Interventions: Review of patient past medical history, allergies, medications, health status, including review of consultants reports, laboratory and other test data, was performed as part of comprehensive evaluation and provision of chronic care management services.   SDOH:  (Social Determinants of Health) assessments and interventions performed: Yes  SDOH Screenings   Alcohol Screen: Not on file  Depression (PHQ2-9): Low Risk    PHQ-2 Score: 0  Financial Resource Strain: Not on file  Food  Insecurity: Not on file  Housing: Not on file  Physical Activity: Not on file  Social Connections: Not on file  Stress: Not on file  Tobacco Use: Medium Risk   Smoking Tobacco Use: Former   Smokeless Tobacco Use: Never  Transportation Needs: Not on file    [place]  Allergies  Allergen Reactions   Tylenol [Acetaminophen] Other (See Comments)    Nightmares   Symbicort [Budesonide-Formoterol Fumarate] Other (See Comments)    Pt felt like her tongue was swollen, could not swallow    Medications Reviewed Today     Reviewed by Jackquline Denmark, MD (Physician) on 05/28/21 at 1438  Med List Status: <None>   Medication Order Taking? Sig  Documenting Provider Last Dose Status Informant  0.9 %  sodium chloride infusion [id number]   Jackquline Denmark, MD  Active   Albuterol Sulfate (PROAIR RESPICLICK) 948 (90 Base) MCG/ACT AEPB [id number] Yes Inhale 2 puffs into the lungs 4 (four) times daily as needed.  Patient taking differently: Inhale 2 puffs into the lungs 4 (four) times daily as needed (sob and wheezing).   [name], MD 05/28/2021 Active            Med Note Orvan Seen, HEATHER L   Sat Feb 04, 2020  4:05 PM)    amLODipine (NORVASC) 10 MG tablet [id number] Yes TAKE (1) TABLET DAILY FOR HIGH BLOOD PRESSURE. Heather Salvage, FNP 05/28/2021 Active   BREO ELLIPTA 200-25 MCG/INH AEPB [id number] Yes INHALE 1 PUFF INTO THE LUNGS ONCE DAILY. Heather Salvage, FNP 05/28/2021 Active   Cholecalciferol (VITAMIN D3 PO) [id number] Yes Take 1 tablet by mouth daily. [provider] 05/27/2021 Active Child  clorazepate (TRANXENE) 7.5 MG tablet [id number] Yes TAKE (1) TABLET TWICE DAILY AS NEEDED FOR ANXIETY. Izora Ribas, MD 05/27/2021 Active   doxepin (SINEQUAN) 10 MG capsule [id number] Yes TAKE 1 CAPSULE AT BEDTIME AS NEEDED. Heather Salvage, FNP 05/27/2021 Active   esomeprazole (NEXIUM) 40 MG capsule [id number] Yes TAKE 1 CAPSULE DAILY. Heather Salvage, FNP 05/26/5092  Active   folic acid (FOLVITE) 1 MG tablet [id number] Yes TAKE 1 TABLET ONCE DAILY. Heather Salvage, FNP 05/27/2021 Active   furosemide (LASIX) 20 MG tablet [id number] Yes TAKE 1 TABLET DAILY IF NEEDED FOR SIGNIFICANT LOWER EXTREMITY SWELLING. Heather [name], [place] 05/27/2021 Active Child           Med Note Duffy Bruce, Legrand Como   Fri Feb 03, 2020  8:58 PM)    gabapentin (NEURONTIN) 100 MG capsule [id number] Yes TAKE (1) CAPSULE THREE TIMES DAILY. [name], MD 05/27/2021 Active   levothyroxine (SYNTHROID) 25 MCG tablet [id number] Yes TAKE 1 TABLET DAILY BEFORE BREAKFAST. Heather Salvage, FNP 05/27/2021 Active   losartan (COZAAR) 25 MG tablet [id number] Yes TAKE (1) TABLET DAILY FOR HIGH BLOOD PRESSURE. Heather Salvage, FNP 05/27/2021 Active   MYRBETRIQ 25 MG TB24 tablet [id number] Yes TAKE 1 TABLET ONCE DAILY. Heather Salvage, FNP 05/27/2021 Active Child  oxybutynin (DITROPAN-XL) 5 MG 24 hr tablet [id number] Yes TAKE 1 TABLET IN THE MORNING FOR BLADDER CONTROL. Heather Salvage, FNP 05/27/2021 Active   potassium chloride SA (KLOR-CON) 20 MEQ tablet [id number] Yes TAKE 1 TABLET ONCE DAILY. Heather Salvage, FNP 05/27/2021 Active   pregabalin (LYRICA) 25 MG capsule [id number] Yes TAKE (1) CAPSULE TWICE DAILY. Izora Ribas, MD 05/27/2021 Active   promethazine (PHENERGAN) 25 MG tablet [id number] No TAKE 1 TABLET EVERY 8 HOURS AS NEEDED FOR NAUSEA & VOMITING. Heather Salvage, FNP Unknown Active   senna-docusate (SENOKOT-S) 8.6-50 MG tablet [id number] No Take 2 tablets by mouth at bedtime.  Patient not taking: Reported on 05/28/2021   Roxan Hockey, MD Not Taking Active   sertraline (ZOLOFT) 50 MG tablet [id number] Yes TAKE 1 TABLET EACH DAY. Heather Salvage, FNP 05/27/2021 Active   traMADol (ULTRAM) 50 MG tablet [id number] Yes TAKE ONE TABLET IN THE MORNING, ONE TABLET AT NOON, AND 1 TABLET IN THE EVENING. Izora Ribas, MD 05/27/2021 Active             Med Note Catha Gosselin Jan 30, 2021  3:14 PM) Last dose: 01/30/2021  VITAMIN D-1000 MAX ST 25  MCG (1000 UT) tablet [id number] Yes Take 1,000 Units by mouth daily. [provider] 05/27/2021 Active             Patient Active Problem List   Diagnosis Date Noted   Iron deficiency anemia 04/10/2021   Goals of care, counseling/discussion 10/10/2020   PHN (postherpetic neuralgia) 05/09/2020   Syncope and collapse 02/03/2020   Fall 04/22/2019   Elevated CK 04/22/2019   Leukocytosis 04/22/2019   Malignant neoplasm of lower lobe of right lung ([place]) 11/16/2018   Dehydration    Abdominal pain 02/27/2018   Colitis 02/27/2018   SBO (small bowel obstruction) ([place]) 02/27/2018   Hypokalemia 02/19/2018   Oral thrush 01/29/2018   Malnutrition of moderate degree 01/29/2018   COPD with acute exacerbation ([place]) 01/28/2018   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   COPD exacerbation ([place]) 05/29/2016   Hypoxia 05/29/2016   Coronary atherosclerosis 07/24/2015   Atherosclerosis of aorta ([place]) 07/24/2015   Dysphagia 07/24/2015   Acute respiratory failure with hypoxia ([place]) 07/23/2015   Bronchospasm with bronchitis, acute 07/23/2015   HIP PAIN, LEFT 05/16/2010   LOW BACK PAIN, CHRONIC 10/01/2009   PARESTHESIA 10/01/2009   Hypothyroidism 07/23/2009   Dyslipidemia 07/23/2009   Anxiety 07/23/2009   Anxiety and depression 07/23/2009   Essential hypertension 07/23/2009   GERD 07/23/2009   DIVERTICULOSIS, COLON 07/23/2009   HEADACHE 07/23/2009    Immunization History  Administered Date(s) Administered   Influenza Split 09/22/2011, 09/22/2012   Influenza Whole 10/01/2009, 08/20/2010   Influenza, High Dose Seasonal PF 08/28/2015, 09/22/2016, 07/31/2017, 08/13/2018   Influenza,inj,Quad PF,6+ Mos 08/25/2014   Influenza-Unspecified 10/08/2013   Pneumococcal Conjugate-13 08/25/2014   Pneumococcal Polysaccharide-23 09/22/2011   Zoster, Live 11/15/2010    Conditions to be  addressed/monitored:  Hypertension, Hyperlipidemia, Coronary Artery Disease, GERD, COPD, Hypothyroidism, Depression, and Anxiety  There are no care plans that you recently modified to display for this patient.    Medication Assistance: {MEDASSISTANCEINFO:25044}  Compliance/Adherence/Medication fill history: Care Gaps: COVID vaccine (never) Shingrix  Star-Rating Drugs: Losartan - LF 03/12/21 x 90 ds  Patient's preferred pharmacy is:  [facility] [address] [place] - [address] [place] [address] Phone: [phone] Fax: [phone]  [place], [place] [address] Phone: [phone] Fax: [phone]  Uses pill box? {Yes or If no, why not?:20788} Pt endorses ***% compliance  We discussed: {Pharmacy options:24294} Patient decided to: {US Pharmacy Plan:23885}  Care Plan and Follow Up Patient Decision:  {FOLLOWUP:24991}  Plan: {CM FOLLOW UP MVEH:20947}      Current Barriers:  {pharmacybarriers:24917}  Pharmacist Clinical Goal(s):  Patient will {PHARMACYGOALCHOICES:24921} through collaboration with PharmD and provider.   Interventions: 1:1 collaboration with Heather Salvage, FNP regarding development and update of comprehensive plan of care as evidenced by provider attestation and co-signature Inter-disciplinary care team collaboration (see longitudinal plan of care) Comprehensive medication review performed; medication list updated in electronic medical record  Hypertension (BP goal <140/90) -{US controlled/uncontrolled:25276} -Current treatment: Amlodipine 10 mg daily Losartan 25 mg daily Furosemide 20 mg daily PRN - not taking? -Medications previously tried: ***  -Current home readings: *** -Current dietary habits: *** -Current exercise habits: *** -{ACTIONS;DENIES/REPORTS:21021675} hypotensive/hypertensive symptoms -Educated on {CCM BP  Counseling:25124} -Counseled to monitor BP at home ***, document, and provide log at future appointments -{CCMPHARMDINTERVENTION:25122}  Hyperlipidemia: (LDL goal < 70) -{US controlled/uncontrolled:25276} -hx aortic atherosclerosis on CT. Last lipid panel 01/2018 -Current  treatment: None -Medications previously tried: atorvastatin  -Current dietary patterns: *** -Current exercise habits: *** -Educated on {CCM HLD Counseling:25126} -{CCMPHARMDINTERVENTION:25122}  COPD (Goal: control symptoms and prevent exacerbations) -{US controlled/uncontrolled:25276} -Gold Grade: Gold 1 (FEV1>80%) -Current COPD Classification:  {CHL HP [facility] Pharm COPD [phone]} -MMRC/CAT score: not on file -Pulmonary function testing: 08/2018 - FEV1 91%, FEV1/FVC 0.61 -Exacerbations requiring treatment in last 6 months: *** -Current treatment  Breo Ellipta 200-25 mcg/inh 1 puff daily Albuterol HFA PRN -Medications previously tried: ***  -Patient {Actions; denies-reports:120008} consistent use of maintenance inhaler -Frequency of rescue inhaler use: *** -Counseled on {CCMINHALERCOUNSELING:25121} -{CCMPHARMDINTERVENTION:25122}  Depression/Anxiety/Insomnia (Goal: manage symptoms) -{US controlled/uncontrolled:25276} -Current treatment: Sertraline 50 mg daily Clorazepate 7.5 mg BID (Dr Ranell Patrick) Doxepin 10 mg daily HS -Medications previously tried/failed: *** -PHQ9: 0 (03/2021) -GAD7: not on file -Connected with *** for mental health support -Educated on {CCM mental health counseling:25127} -{CCMPHARMDINTERVENTION:25122}  Hypothyroidism (Goal: maintain TSH in goal range) -{US controlled/uncontrolled:25276} -Current treatment  Levothyroxine 25 mcg daily -Medications previously tried: ***  -{CCMPHARMDINTERVENTION:25122}  GERD (Goal: manage symptoms) -{US controlled/uncontrolled:25276} -Current treatment  Esomeprazole 40 mg daily -Medications previously tried: ***   -{CCMPHARMDINTERVENTION:25122}  Constipation (Goal: ***) -{US controlled/uncontrolled:25276} -Current treatment  Senna-docusate PRN -Medications previously tried: ***  -{CCMPHARMDINTERVENTION:25122}  OAB (Goal: manage symptoms) -{US controlled/uncontrolled:25276} -Current treatment  Myrbetriq 25 mg daily - not taking? Oxybutynin XL 5 mg daily -Medications previously tried: ***  -{CCMPHARMDINTERVENTION:25122}  Chronic pain (Goal: ***) -{US controlled/uncontrolled:25276} -follows with Dr Ranell Patrick -Current treatment  Gabapentin 100 mg TID - not taking? Pregabalin 25 mg BID Tramadol 50 mg TID -Medications previously tried: ***  -{CCMPHARMDINTERVENTION:25122}  Health Maintenance -Vaccine gaps: *** -Current therapy:  Folic acid 1 mg daily Vitamin D 1000 IU daily [place]-Con 20 mEq daily Promethazine 25 mg PRN -Educated on {ccm supplement counseling:25128} -{CCM Patient satisfied:25129} -{CCMPHARMDINTERVENTION:25122}  Patient Goals/Self-Care Activities Patient will:  - {pharmacypatientgoals:24919}

## 2021-05-30 NOTE — Telephone Encounter (Signed)
Inbound call from pt requesting a call back stating that her stomach is swollen and she is in pain. Please advise. Thanks.

## 2021-05-30 NOTE — Telephone Encounter (Signed)
Spoke with pt and she is aware.

## 2021-05-30 NOTE — Telephone Encounter (Signed)
Pt calling, states that about 9am this morning she started having pain in the base of her stomach. Reports that it feels like her stomach is on fire. Reports she had a bowel movement last night and everything was fine, stated she took a laxative. She did not see any blood in the stool. Pt states she is quite uncomfortable. Please advise.

## 2021-05-31 ENCOUNTER — Other Ambulatory Visit: Payer: Self-pay | Admitting: Family

## 2021-06-03 ENCOUNTER — Encounter: Payer: Self-pay | Admitting: Gastroenterology

## 2021-06-03 ENCOUNTER — Other Ambulatory Visit: Payer: Self-pay | Admitting: Family

## 2021-06-06 NOTE — Progress Notes (Signed)
    Chronic Care Management Pharmacy Assistant   Name: Heather [name]  MRN: [id number] DOB: 03-23-1935   Reason for Encounter: Chart Review    Medications: Outpatient Encounter Medications as of 05/29/2021  Medication Sig Note   Albuterol Sulfate (PROAIR RESPICLICK) 408 (90 Base) MCG/ACT AEPB Inhale 2 puffs into the lungs 4 (four) times daily as needed. (Patient taking differently: Inhale 2 puffs into the lungs 4 (four) times daily as needed (sob and wheezing).)    Cholecalciferol (VITAMIN D3 PO) Take 1 tablet by mouth daily.    clorazepate (TRANXENE) 7.5 MG tablet TAKE (1) TABLET TWICE DAILY AS NEEDED FOR ANXIETY.    furosemide (LASIX) 20 MG tablet TAKE 1 TABLET DAILY IF NEEDED FOR SIGNIFICANT LOWER EXTREMITY SWELLING.    gabapentin (NEURONTIN) 100 MG capsule TAKE (1) CAPSULE THREE TIMES DAILY.    MYRBETRIQ 25 MG TB24 tablet TAKE 1 TABLET ONCE DAILY.    pregabalin (LYRICA) 25 MG capsule TAKE (1) CAPSULE TWICE DAILY.    promethazine (PHENERGAN) 25 MG tablet TAKE 1 TABLET EVERY 8 HOURS AS NEEDED FOR NAUSEA & VOMITING.    senna-docusate (SENOKOT-S) 8.6-50 MG tablet Take 2 tablets by mouth at bedtime. (Patient not taking: Reported on 05/28/2021)    traMADol (ULTRAM) 50 MG tablet TAKE ONE TABLET IN THE MORNING, ONE TABLET AT NOON, AND 1 TABLET IN THE EVENING. 01/30/2021: Last dose: 01/30/2021   VITAMIN D-1000 MAX ST 25 MCG (1000 UT) tablet Take 1,000 Units by mouth daily.    [DISCONTINUED] amLODipine (NORVASC) 10 MG tablet TAKE (1) TABLET DAILY FOR HIGH BLOOD PRESSURE.    [DISCONTINUED] BREO ELLIPTA 200-25 MCG/INH AEPB INHALE 1 PUFF INTO THE LUNGS ONCE DAILY.    [DISCONTINUED] doxepin (SINEQUAN) 10 MG capsule TAKE 1 CAPSULE AT BEDTIME AS NEEDED.    [DISCONTINUED] esomeprazole (NEXIUM) 40 MG capsule TAKE 1 CAPSULE DAILY.    [DISCONTINUED] folic acid (FOLVITE) 1 MG tablet TAKE 1 TABLET ONCE DAILY.    [DISCONTINUED] levothyroxine (SYNTHROID) 25 MCG tablet TAKE 1 TABLET DAILY BEFORE BREAKFAST.     [DISCONTINUED] losartan (COZAAR) 25 MG tablet TAKE (1) TABLET DAILY FOR HIGH BLOOD PRESSURE.    [DISCONTINUED] oxybutynin (DITROPAN-XL) 5 MG 24 hr tablet TAKE 1 TABLET IN THE MORNING FOR BLADDER CONTROL.    [DISCONTINUED] potassium chloride SA (KLOR-CON) 20 MEQ tablet TAKE 1 TABLET ONCE DAILY.    [DISCONTINUED] sertraline (ZOLOFT) 50 MG tablet TAKE 1 TABLET EACH DAY.    Facility-Administered Encounter Medications as of 05/29/2021  Medication   0.9 %  sodium chloride infusion   Pharmacist Review  Reviewed chart for medication changes and adherence.  No OVs, Consults, or hospital visits since last care coordination call / Pharmacist visit. No medication changes indicated  No gaps in adherence identified. Patient has follow up scheduled with pharmacy team. No further action required.   [place] Pharmacist Assistant [phone]   Time spent:6

## 2021-06-14 ENCOUNTER — Ambulatory Visit (HOSPITAL_COMMUNITY)
Admission: RE | Admit: 2021-06-14 | Discharge: 2021-06-14 | Disposition: A | Discharge status: Home / Self Care | Payer: Medicare Other | Source: Ambulatory Visit | Attending: Radiation Oncology | Admitting: Radiation Oncology

## 2021-06-14 ENCOUNTER — Other Ambulatory Visit: Payer: Self-pay

## 2021-06-14 DIAGNOSIS — C3431 Malignant neoplasm of lower lobe, right bronchus or lung: Secondary | ICD-10-CM | POA: Diagnosis not present

## 2021-06-14 DIAGNOSIS — C3490 Malignant neoplasm of unspecified part of unspecified bronchus or lung: Secondary | ICD-10-CM | POA: Diagnosis not present

## 2021-06-14 DIAGNOSIS — I700 Atherosclerosis of aorta: Secondary | ICD-10-CM | POA: Diagnosis not present

## 2021-06-14 DIAGNOSIS — J439 Emphysema, unspecified: Secondary | ICD-10-CM | POA: Diagnosis not present

## 2021-06-14 LAB — POCT I-STAT CREATININE: Creatinine, Ser: 0.7 mg/dL (ref 0.44–1.00)

## 2021-06-14 MED ORDER — IOHEXOL 300 MG/ML  SOLN
100.0000 mL | Freq: Once | INTRAMUSCULAR | Status: AC | PRN
  Administered 2021-06-14: 75 mL via INTRAVENOUS

## 2021-06-17 ENCOUNTER — Ambulatory Visit: Payer: Medicare Other | Admitting: Internal Medicine

## 2021-06-17 ENCOUNTER — Other Ambulatory Visit: Payer: Medicare Other

## 2021-06-18 ENCOUNTER — Other Ambulatory Visit: Payer: Self-pay

## 2021-06-18 ENCOUNTER — Encounter: Payer: Self-pay | Admitting: Internal Medicine

## 2021-06-18 ENCOUNTER — Inpatient Hospital Stay: Payer: Medicare Other | Attending: Physician Assistant

## 2021-06-18 ENCOUNTER — Inpatient Hospital Stay: Payer: Medicare Other | Admitting: Internal Medicine

## 2021-06-18 ENCOUNTER — Encounter: Payer: Self-pay | Admitting: Radiation Oncology

## 2021-06-18 ENCOUNTER — Ambulatory Visit
Admission: RE | Admit: 2021-06-18 | Discharge: 2021-06-18 | Disposition: A | Discharge status: Home / Self Care | Payer: Medicare Other | Source: Ambulatory Visit | Attending: Radiation Oncology | Admitting: Radiation Oncology

## 2021-06-18 VITALS — BP 116/62 | HR 94 | Temp 98.2°F | Resp 16 | Ht 59.0 in | Wt 121.0 lb

## 2021-06-18 DIAGNOSIS — D509 Iron deficiency anemia, unspecified: Secondary | ICD-10-CM | POA: Insufficient documentation

## 2021-06-18 DIAGNOSIS — C3490 Malignant neoplasm of unspecified part of unspecified bronchus or lung: Secondary | ICD-10-CM | POA: Insufficient documentation

## 2021-06-18 DIAGNOSIS — D500 Iron deficiency anemia secondary to blood loss (chronic): Secondary | ICD-10-CM | POA: Diagnosis not present

## 2021-06-18 DIAGNOSIS — Z79899 Other long term (current) drug therapy: Secondary | ICD-10-CM | POA: Diagnosis not present

## 2021-06-18 DIAGNOSIS — Z923 Personal history of irradiation: Secondary | ICD-10-CM | POA: Diagnosis not present

## 2021-06-18 DIAGNOSIS — C3431 Malignant neoplasm of lower lobe, right bronchus or lung: Secondary | ICD-10-CM

## 2021-06-18 DIAGNOSIS — Z08 Encounter for follow-up examination after completed treatment for malignant neoplasm: Secondary | ICD-10-CM | POA: Diagnosis not present

## 2021-06-18 DIAGNOSIS — R778 Other specified abnormalities of plasma proteins: Secondary | ICD-10-CM

## 2021-06-18 DIAGNOSIS — D649 Anemia, unspecified: Secondary | ICD-10-CM

## 2021-06-18 LAB — CBC WITH DIFFERENTIAL (CANCER CENTER ONLY)
Abs Immature Granulocytes: 0.03 10*3/uL (ref 0.00–0.07)
Basophils Absolute: 0 10*3/uL (ref 0.0–0.1)
Basophils Relative: 0 %
Eosinophils Absolute: 0.1 10*3/uL (ref 0.0–0.5)
Eosinophils Relative: 1 %
HCT: 37.5 % (ref 36.0–46.0)
Hemoglobin: 11.9 g/dL — ABNORMAL LOW (ref 12.0–15.0)
Immature Granulocytes: 0 %
Lymphocytes Relative: 19 %
Lymphs Abs: 1.3 10*3/uL (ref 0.7–4.0)
MCH: 27.6 pg (ref 26.0–34.0)
MCHC: 31.7 g/dL (ref 30.0–36.0)
MCV: 87 fL (ref 80.0–100.0)
Monocytes Absolute: 0.7 10*3/uL (ref 0.1–1.0)
Monocytes Relative: 9 %
Neutro Abs: 4.9 10*3/uL (ref 1.7–7.7)
Neutrophils Relative %: 71 %
Platelet Count: 291 10*3/uL (ref 150–400)
RBC: 4.31 MIL/uL (ref 3.87–5.11)
RDW: 22.5 % — ABNORMAL HIGH (ref 11.5–15.5)
WBC Count: 7 10*3/uL (ref 4.0–10.5)
nRBC: 0 % (ref 0.0–0.2)

## 2021-06-18 LAB — IRON AND TIBC
Iron: 100 ug/dL (ref 41–142)
Saturation Ratios: 33 % (ref 21–57)
TIBC: 305 ug/dL (ref 236–444)
UIBC: 205 ug/dL (ref 120–384)

## 2021-06-18 LAB — LACTATE DEHYDROGENASE: LDH: 194 U/L — ABNORMAL HIGH (ref 98–192)

## 2021-06-18 LAB — FERRITIN: Ferritin: 310 ng/mL — ABNORMAL HIGH (ref 11–307)

## 2021-06-18 NOTE — Progress Notes (Signed)
Heather [name]   [phone]  OFFICE PROGRESS NOTE  Heather Salvage, Heather [name] [address] [place] [address]  DIAGNOSIS:  1) Microcytic anemia secondary to iron deficiency from likely gastrointestinal blood loss. 2) history of stage IA2 (T1b, N0, M0) non-small cell lung cancer diagnosed in December 2019 status post SBRT under the care of Dr. Lisbeth Renshaw.  PRIOR THERAPY: Iron infusion with Venofer 300 mg IV weekly for 3 doses.  CURRENT THERAPY: Observation.  INTERVAL HISTORY: Heather [name] 86 y.o. female returns to the clinic today for follow-up visit accompanied by her son.  The patient is feeling fine today with no concerning complaints.  She felt much better after receiving iron infusion with Venofer.  She also underwent upper endoscopy that showed diffuse moderate inflammation in the entire examined stomach.  Her colonoscopy was not complete because of bed preparation.  The patient denied having any current chest pain, shortness of breath, cough or hemoptysis.  She denied having any fever or chills.  She has no nausea, vomiting, diarrhea but has constipation.  She is followed by Dr. Lisbeth Renshaw for history of stage Ia non-small cell lung cancer and she had a recent CT scan of the chest performed recently that showed increase in the right hilar soft tissue mass suspicious for disease recurrence.  She is supposed to have repeat CT scan of the chest in 3 months for further evaluation.  The patient is here today for evaluation and repeat blood work.  MEDICAL HISTORY: Past Medical History:  Diagnosis Date   ANXIETY 07/23/2009   Atherosclerosis of aorta ([place]) 7/90/2409   Complication of anesthesia 7 or 8 yrs ago   woke up during colonscopy   COPD (chronic obstructive pulmonary disease) ([place])    Coronary atherosclerosis 07/24/2015   DEPRESSION 07/23/2009   DIVERTICULOSIS, COLON 07/23/2009   GERD 07/23/2009   Headache(784.0)  07/23/2009   occasional   Hemorrhoids    HIP PAIN, LEFT 05/16/2010   History of Crohn's disease    HYPERLIPIDEMIA 07/23/2009   HYPERTENSION 07/23/2009   HYPOTHYROIDISM 07/23/2009   IBS (irritable bowel syndrome)    Insomnia    LOW BACK PAIN, CHRONIC 10/01/2009   lung ca dx'd 2019   SBRT comp 12/2018   PARESTHESIA 10/01/2009   Shingles 2006   back    ALLERGIES:  is allergic to tylenol [acetaminophen] and symbicort [budesonide-formoterol fumarate].  MEDICATIONS:  Current Outpatient Medications  Medication Sig Dispense Refill   Albuterol Sulfate (PROAIR RESPICLICK) 735 (90 Base) MCG/ACT AEPB Inhale 2 puffs into the lungs 4 (four) times daily as needed. (Patient taking differently: Inhale 2 puffs into the lungs 4 (four) times daily as needed (sob and wheezing).) 1 each 6   amLODipine (NORVASC) 10 MG tablet TAKE (1) TABLET DAILY FOR HIGH BLOOD PRESSURE. 90 tablet 0   BREO ELLIPTA 200-25 MCG/INH AEPB INHALE 1 PUFF INTO THE LUNGS ONCE DAILY. 180 each 0   Cholecalciferol (VITAMIN D3 PO) Take 1 tablet by mouth daily.     clorazepate (TRANXENE) 7.5 MG tablet TAKE (1) TABLET TWICE DAILY AS NEEDED FOR ANXIETY. 60 tablet 5   docusate sodium (COLACE) 250 MG capsule Take 250 mg by mouth daily.     doxepin (SINEQUAN) 10 MG capsule TAKE 1 CAPSULE AT BEDTIME AS NEEDED. 90 capsule 0   esomeprazole (NEXIUM) 40 MG capsule TAKE 1 CAPSULE DAILY. 90 capsule 0   folic acid (FOLVITE) 1 MG tablet TAKE 1  TABLET ONCE DAILY. 90 tablet 0   furosemide (LASIX) 20 MG tablet TAKE 1 TABLET DAILY IF NEEDED FOR SIGNIFICANT LOWER EXTREMITY SWELLING. 90 tablet 0   gabapentin (NEURONTIN) 100 MG capsule TAKE (1) CAPSULE THREE TIMES DAILY. 90 capsule 0   levothyroxine (SYNTHROID) 25 MCG tablet TAKE 1 TABLET DAILY BEFORE BREAKFAST. 90 tablet 0   losartan (COZAAR) 25 MG tablet TAKE (1) TABLET DAILY FOR HIGH BLOOD PRESSURE. 90 tablet 0   MYRBETRIQ 25 MG TB24 tablet TAKE 1 TABLET ONCE DAILY. 30 tablet 3   oxybutynin (DITROPAN-XL) 5  MG 24 hr tablet TAKE 1 TABLET IN THE MORNING FOR BLADDER CONTROL. 90 tablet 0   potassium chloride SA (KLOR-CON) 20 MEQ tablet TAKE 1 TABLET ONCE DAILY. 90 tablet 0   pregabalin (LYRICA) 25 MG capsule TAKE (1) CAPSULE TWICE DAILY. 60 capsule 3   promethazine (PHENERGAN) 25 MG tablet TAKE 1 TABLET EVERY 8 HOURS AS NEEDED FOR NAUSEA & VOMITING. 30 tablet 1   senna-docusate (SENOKOT-S) 8.6-50 MG tablet Take 2 tablets by mouth at bedtime. (Patient not taking: Reported on 06/18/2021) 60 tablet 2   sertraline (ZOLOFT) 50 MG tablet TAKE 1 TABLET EACH DAY. 90 tablet 0   traMADol (ULTRAM) 50 MG tablet TAKE ONE TABLET IN THE MORNING, ONE TABLET AT NOON, AND 1 TABLET IN THE EVENING. 90 tablet 5   VITAMIN D-1000 MAX ST 25 MCG (1000 UT) tablet Take 1,000 Units by mouth daily.     Current Facility-Administered Medications  Medication Dose Route Frequency Provider Last Rate Last Admin   0.9 %  sodium chloride infusion  500 mL Intravenous Once Jackquline Denmark, MD        SURGICAL HISTORY:  Past Surgical History:  Procedure Laterality Date   ABDOMINAL HYSTERECTOMY  age 58 or 50   APPENDECTOMY  yrs ago   BILATERAL SALPINGOOPHORECTOMY  age 19 or 64   COLONOSCOPY N/A 11/17/2013   Procedure: COLONOSCOPY;  Surgeon: Cleotis Nipper, MD;  Location: [facility];  Service: Endoscopy;  Laterality: N/A;   ESOPHAGOGASTRODUODENOSCOPY N/A 11/17/2013   Procedure: ESOPHAGOGASTRODUODENOSCOPY (EGD);  Surgeon: Cleotis Nipper, MD;  Location: [facility];  Service: Endoscopy;  Laterality: N/A;   HEMORRHOID SURGERY  yrs ago   TONSILLECTOMY  yrs ago    REVIEW OF SYSTEMS:  A comprehensive review of systems was negative except for: Constitutional: positive for fatigue Gastrointestinal: positive for constipation   PHYSICAL EXAMINATION: General appearance: alert, cooperative, and no distress Head: Normocephalic, without obvious abnormality, atraumatic Neck: no adenopathy, no JVD, supple, symmetrical, trachea midline, and  thyroid not enlarged, symmetric, no tenderness/mass/nodules Lymph nodes: Cervical, supraclavicular, and axillary nodes normal. Resp: clear to auscultation bilaterally Back: symmetric, no curvature. ROM normal. No CVA tenderness. Cardio: regular rate and rhythm, S1, S2 normal, no murmur, click, rub or gallop GI: soft, non-tender; bowel sounds normal; no masses,  no organomegaly Extremities: extremities normal, atraumatic, no cyanosis or edema  ECOG PERFORMANCE STATUS: 1 - Symptomatic but completely ambulatory  Blood pressure 116/62, pulse 94, temperature 98.2 F (36.8 C), temperature source Tympanic, resp. rate 16, height 4\' 11"  (1.499 m), weight 121 lb (54.9 kg), SpO2 92 %.  LABORATORY DATA: Lab Results  Component Value Date   WBC 7.0 06/18/2021   HGB 11.9 (L) 06/18/2021   HCT 37.5 06/18/2021   MCV 87.0 06/18/2021   PLT 291 06/18/2021      Chemistry      Component Value Date/Time   NA 138 04/10/2021 1309   K 4.1 04/10/2021  1309   CL 103 04/10/2021 1309   CO2 26 04/10/2021 1309   BUN 9 04/10/2021 1309   CREATININE 0.70 06/14/2021 1530   CREATININE 0.94 04/10/2021 1309      Component Value Date/Time   CALCIUM 9.4 04/10/2021 1309   ALKPHOS 85 04/10/2021 1309   AST 16 04/10/2021 1309   ALT 7 04/10/2021 1309   BILITOT 0.4 04/10/2021 1309       RADIOGRAPHIC STUDIES: CT CHEST W CONTRAST  Result Date: 06/16/2021 CLINICAL DATA:  Restaging non-small cell lung cancer status post radiation therapy. EXAM: CT CHEST WITH CONTRAST TECHNIQUE: Multidetector CT imaging of the chest was performed during intravenous contrast administration. CONTRAST:  8mL OMNIPAQUE IOHEXOL 300 MG/ML  SOLN COMPARISON:  Multiple previous CT scans. The most recent is 12/03/2020 FINDINGS: Cardiovascular: The heart is normal in size. No pericardial effusion. The aorta is normal in caliber. No dissection. Stable atherosclerotic calcifications. Stable three-vessel coronary artery calcifications.  Mediastinum/Nodes: Stable small scattered mediastinal lymph nodes. No new or progressive findings in the mediastinum. However, there is increasing soft tissue density in the right infrahilar region suspicious for recurrent tumor/right infrahilar adenopathy. The esophagus is grossly normal. Lungs/Pleura: Continued retraction/contraction of the treated right lower lobe mass measuring a maximum of 4.6 x 4.0 cm. This previously measured 5.0 x 4.6 cm when remeasured in the same planes. Stable significant underlying emphysematous changes. No new pulmonary lesions or pulmonary nodules. No pleural effusions or pleural lesions. Upper Abdomen: No significant upper abdominal findings. No hepatic or adrenal gland lesions. Stable aortic and branch vessel calcifications. Musculoskeletal: No breast masses, supraclavicular or axillary adenopathy. The bony thorax is intact. IMPRESSION: 1. Continued retraction/contraction of the treated right lower lobe mass. 2. Increasing/new soft tissue density in the right infrahilar region suspicious for recurrent tumor/right infrahilar adenopathy. Recommend follow-up PET-CT for further evaluation. 3. Stable small scattered mediastinal lymph nodes. 4. Stable emphysematous changes. 5. No findings for upper abdominal metastatic disease. 6. Stable advanced atherosclerotic calcifications involving the aorta and branch vessels including the coronary arteries. 7. Emphysema and aortic atherosclerosis. Aortic Atherosclerosis (ICD10-I70.0) and Emphysema (ICD10-J43.9). Electronically Signed   By: Marijo Sanes M.D.   On: 06/16/2021 10:32    ASSESSMENT AND PLAN: This is a very pleasant 86 years old white female with history of iron deficiency anemia likely from gastrointestinal blood loss.  The patient was treated with iron infusion with Venofer 300 mg IV weekly for 3 weeks and she has significant improvement in her condition as well as the fatigue.  Her hemoglobin is up to 11.9. Regarding the stage Ia  non-small cell lung cancer, we will monitor the soft tissue area in the right hilar region very closely and consider The patient for a PET scan if it continues to increase in size.  She is scheduled to have repeat CT scan of the chest in 3 months. The patient was advised to call immediately if she has any other concerning symptoms in the interval. The patient voices understanding of current disease status and treatment options and is in agreement with the current care plan.  All questions were answered. The patient knows to call the clinic with any problems, questions or concerns. We can certainly see the patient much sooner if necessary. The total time spent in the appointment was 20 minutes.  Disclaimer: This note was dictated with voice recognition software. Similar sounding words can inadvertently be transcribed and may not be corrected upon review.

## 2021-06-18 NOTE — Progress Notes (Signed)
Radiation Oncology         ([phone]) [phone] ________________________________  Outpatient Follow Up - Conducted via telephone due to current COVID-19 concerns for limiting patient exposure  I spoke with the patient to conduct this consult visit via telephone to spare the patient unnecessary potential exposure in the healthcare setting during the current COVID-19 pandemic. The patient was notified in advance and was offered a [place] meeting to allow for face to face communication but unfortunately reported that they did not have the appropriate resources/technology to support such a visit and instead preferred to proceed with a telephone visit.   Name: Heather [name]        MRN: [id number]  Date of Service: 06/18/2021 DOB: 06/12/1935  HQ:IONGEX, Marvis Repress, FNP  Marrian Salvage,*     REFERRING PHYSICIAN: Marrian Salvage,*   DIAGNOSIS: The primary encounter diagnosis was Malignant neoplasm of lower lobe of right lung ([place]). A diagnosis of Malignant neoplasm of unspecified part of unspecified bronchus or lung ([place]) was also pertinent to this visit.   HISTORY OF PRESENT ILLNESS: Heather [name] is a 86 y.o. female with a history of putative Stage IA2, cT1bN0M0, NSCLC of the right lower lobe. The paitent has a history of COPD controlled on 3 inhalers and who had a CT in February 2019 that revealed a 1 cm mass in the right lower lobe. PET on 06/28/2018 showed SUV of the lesion was 2.77, with a blood pool of 2.23.  The lesion did measure 12 mm.  No additional evidence of adenopathy was identified.  She was counseled on repeat imaging and underwent this on 10/18/2018.  The lesion now measured 15 x 13 mm in comparison to her prior imaging.  No adenopathy is identified.  She was against moving forward with aggressive work-up, and would not be a good surgical candidate.  She proceeded with stereotactic body radiotherapy (SBRT) which she completed in January 2020. She has been NED since  though she also has compression deformity seen at L2, as well as atherosclerotic disease in the aorta and emphysema. Her most recent scan on 07/31/2020 revealed continued masslike fibrosis in the right lower lobe that had improved slightly now measuring 3.8 x 4.7 cm, read as grossly unchanged but residual viable tumor was difficult to exclude by CT, she also had a 10 mm intralobular right lower lobe nodule unchanged but considered suspicious and a 5 mm right hilar lymph node within normal limits.  There was concern for some small upper abdominal lymph nodes including 8 mm portacaval node, and 10 mm periaortic node.  Given these findings it was recommended that she pursue pet imaging which showed hypermetabolic change  in the right infrahilar region measuring 1.1 cm. Her case was discussed in thoracic oncology conference she is not a candidate for bronchoscopy with EBUS, rather she was treated putatively to the right lower lobe infrahilar region, given the central location this was treated in 10 fractions, she had posttreatment imaging showed stability. Repeat CT scan of the chest on 06/14/2021 showed continued retraction of the right lower lobe mass measuring 4.6 cm in greatest dimension, previously 5 cm.  There was persistent density in the right infrahilar region and stable emphysematous changes aortic atherosclerosis involving the aorta and branch vessels and coronary arteries.  She is contacted today to review these results.   On review review of systems the patient reports that she is doing well and denies difficulty with breathing, chest pain, shortness of breath or fevers.  She reports she's been dealing with constipation and will be having a second colonoscopy due to inadequate preparation previously. She is taking 3 to 4 dulcolax per day and still feels very constipated. No other complaints are verbalized.    PREVIOUS RADIATION THERAPY:   10/15/20-10/26/20 SBRT Treatment: The RLL/infrahilar Target was  treated to 60 Gy in 10 fractions  12/15/18-12/22/2018 SBRT Treatment: The RLL Target was treated to 54 Gy in 3 fractions  PAST MEDICAL HISTORY:  Past Medical History:  Diagnosis Date   ANXIETY 07/23/2009   Atherosclerosis of aorta ([place]) 02/20/4007   Complication of anesthesia 7 or 8 yrs ago   woke up during colonscopy   COPD (chronic obstructive pulmonary disease) ([place])    Coronary atherosclerosis 07/24/2015   DEPRESSION 07/23/2009   DIVERTICULOSIS, COLON 07/23/2009   GERD 07/23/2009   Headache(784.0) 07/23/2009   occasional   Hemorrhoids    HIP PAIN, LEFT 05/16/2010   History of Crohn's disease    HYPERLIPIDEMIA 07/23/2009   HYPERTENSION 07/23/2009   HYPOTHYROIDISM 07/23/2009   IBS (irritable bowel syndrome)    Insomnia    LOW BACK PAIN, CHRONIC 10/01/2009   lung ca dx'd 2019   SBRT comp 12/2018   PARESTHESIA 10/01/2009   Shingles 2006   back       PAST SURGICAL HISTORY: Past Surgical History:  Procedure Laterality Date   ABDOMINAL HYSTERECTOMY  age 40 or 65   APPENDECTOMY  yrs ago   BILATERAL SALPINGOOPHORECTOMY  age 50 or 38   COLONOSCOPY N/A 11/17/2013   Procedure: COLONOSCOPY;  Surgeon: Cleotis Nipper, MD;  Location: [facility];  Service: Endoscopy;  Laterality: N/A;   ESOPHAGOGASTRODUODENOSCOPY N/A 11/17/2013   Procedure: ESOPHAGOGASTRODUODENOSCOPY (EGD);  Surgeon: Cleotis Nipper, MD;  Location: [facility];  Service: Endoscopy;  Laterality: N/A;   HEMORRHOID SURGERY  yrs ago   TONSILLECTOMY  yrs ago     FAMILY HISTORY:  Family History  Problem Relation Age of Onset   Cancer Mother        Unknown type   Cancer Sister        Breast   Clotting disorder Neg Hx      SOCIAL HISTORY:  reports that she quit smoking about 22 years ago. Her smoking use included cigarettes. She started smoking about 60 years ago. She smoked an average of 0.50 packs per day. She has never used smokeless tobacco. She reports that she does not drink alcohol and does not use drugs. The  patient is widowed and lives in [place]. Her son Remo Lipps is an active participant in her medical care.   ALLERGIES: Tylenol [acetaminophen] and Symbicort [budesonide-formoterol fumarate]   MEDICATIONS:  Current Outpatient Medications  Medication Sig Dispense Refill   Albuterol Sulfate (PROAIR RESPICLICK) 676 (90 Base) MCG/ACT AEPB Inhale 2 puffs into the lungs 4 (four) times daily as needed. (Patient taking differently: Inhale 2 puffs into the lungs 4 (four) times daily as needed (sob and wheezing).) 1 each 6   amLODipine (NORVASC) 10 MG tablet TAKE (1) TABLET DAILY FOR HIGH BLOOD PRESSURE. 90 tablet 0   BREO ELLIPTA 200-25 MCG/INH AEPB INHALE 1 PUFF INTO THE LUNGS ONCE DAILY. 180 each 0   Cholecalciferol (VITAMIN D3 PO) Take 1 tablet by mouth daily.     clorazepate (TRANXENE) 7.5 MG tablet TAKE (1) TABLET TWICE DAILY AS NEEDED FOR ANXIETY. 60 tablet 5   docusate sodium (COLACE) 250 MG capsule Take 250 mg by mouth daily.     doxepin (SINEQUAN) 10 MG  capsule TAKE 1 CAPSULE AT BEDTIME AS NEEDED. 90 capsule 0   esomeprazole (NEXIUM) 40 MG capsule TAKE 1 CAPSULE DAILY. 90 capsule 0   folic acid (FOLVITE) 1 MG tablet TAKE 1 TABLET ONCE DAILY. 90 tablet 0   furosemide (LASIX) 20 MG tablet TAKE 1 TABLET DAILY IF NEEDED FOR SIGNIFICANT LOWER EXTREMITY SWELLING. 90 tablet 0   gabapentin (NEURONTIN) 100 MG capsule TAKE (1) CAPSULE THREE TIMES DAILY. 90 capsule 0   levothyroxine (SYNTHROID) 25 MCG tablet TAKE 1 TABLET DAILY BEFORE BREAKFAST. 90 tablet 0   losartan (COZAAR) 25 MG tablet TAKE (1) TABLET DAILY FOR HIGH BLOOD PRESSURE. 90 tablet 0   MYRBETRIQ 25 MG TB24 tablet TAKE 1 TABLET ONCE DAILY. 30 tablet 3   oxybutynin (DITROPAN-XL) 5 MG 24 hr tablet TAKE 1 TABLET IN THE MORNING FOR BLADDER CONTROL. 90 tablet 0   potassium chloride SA (KLOR-CON) 20 MEQ tablet TAKE 1 TABLET ONCE DAILY. 90 tablet 0   pregabalin (LYRICA) 25 MG capsule TAKE (1) CAPSULE TWICE DAILY. 60 capsule 3   promethazine  (PHENERGAN) 25 MG tablet TAKE 1 TABLET EVERY 8 HOURS AS NEEDED FOR NAUSEA & VOMITING. 30 tablet 1   sertraline (ZOLOFT) 50 MG tablet TAKE 1 TABLET EACH DAY. 90 tablet 0   traMADol (ULTRAM) 50 MG tablet TAKE ONE TABLET IN THE MORNING, ONE TABLET AT NOON, AND 1 TABLET IN THE EVENING. 90 tablet 5   VITAMIN D-1000 MAX ST 25 MCG (1000 UT) tablet Take 1,000 Units by mouth daily.     senna-docusate (SENOKOT-S) 8.6-50 MG tablet Take 2 tablets by mouth at bedtime. (Patient not taking: Reported on 06/18/2021) 60 tablet 2   Current Facility-Administered Medications  Medication Dose Route Frequency Provider Last Rate Last Admin   0.9 %  sodium chloride infusion  500 mL Intravenous Once Jackquline Denmark, MD          PHYSICAL EXAM:  Unable to assess due to encounter type.  ECOG = 0  0 - Asymptomatic (Fully active, able to carry on all predisease activities without restriction)  1 - Symptomatic but completely ambulatory (Restricted in physically strenuous activity but ambulatory and able to carry out work of a light or sedentary nature. For example, light housework, office work)  2 - Symptomatic, <50% in bed during the day (Ambulatory and capable of all self care but unable to carry out any work activities. Up and about more than 50% of waking hours)  3 - Symptomatic, >50% in bed, but not bedbound (Capable of only limited self-care, confined to bed or chair 50% or more of waking hours)  4 - Bedbound (Completely disabled. Cannot carry on any self-care. Totally confined to bed or chair)  5 - Death   Eustace Pen MM, Creech RH, Tormey DC, et al. [phone]). "Toxicity and response criteria of the [facility]". [place] Oncol. 5 (6): 649-55    LABORATORY DATA:  Lab Results  Component Value Date   WBC 7.5 04/10/2021   HGB 9.5 (L) 04/10/2021   HCT 32.2 (L) 04/10/2021   MCV 76.3 (L) 04/10/2021   PLT 379 04/10/2021   Lab Results  Component Value Date   NA 138 04/10/2021   K 4.1  04/10/2021   CL 103 04/10/2021   CO2 26 04/10/2021   Lab Results  Component Value Date   ALT 7 04/10/2021   AST 16 04/10/2021   ALKPHOS 85 04/10/2021   BILITOT 0.4 04/10/2021      RADIOGRAPHY: CT  CHEST W CONTRAST  Result Date: 06/16/2021 CLINICAL DATA:  Restaging non-small cell lung cancer status post radiation therapy. EXAM: CT CHEST WITH CONTRAST TECHNIQUE: Multidetector CT imaging of the chest was performed during intravenous contrast administration. CONTRAST:  29mL OMNIPAQUE IOHEXOL 300 MG/ML  SOLN COMPARISON:  Multiple previous CT scans. The most recent is 12/03/2020 FINDINGS: Cardiovascular: The heart is normal in size. No pericardial effusion. The aorta is normal in caliber. No dissection. Stable atherosclerotic calcifications. Stable three-vessel coronary artery calcifications. Mediastinum/Nodes: Stable small scattered mediastinal lymph nodes. No new or progressive findings in the mediastinum. However, there is increasing soft tissue density in the right infrahilar region suspicious for recurrent tumor/right infrahilar adenopathy. The esophagus is grossly normal. Lungs/Pleura: Continued retraction/contraction of the treated right lower lobe mass measuring a maximum of 4.6 x 4.0 cm. This previously measured 5.0 x 4.6 cm when remeasured in the same planes. Stable significant underlying emphysematous changes. No new pulmonary lesions or pulmonary nodules. No pleural effusions or pleural lesions. Upper Abdomen: No significant upper abdominal findings. No hepatic or adrenal gland lesions. Stable aortic and branch vessel calcifications. Musculoskeletal: No breast masses, supraclavicular or axillary adenopathy. The bony thorax is intact. IMPRESSION: 1. Continued retraction/contraction of the treated right lower lobe mass. 2. Increasing/new soft tissue density in the right infrahilar region suspicious for recurrent tumor/right infrahilar adenopathy. Recommend follow-up PET-CT for further evaluation.  3. Stable small scattered mediastinal lymph nodes. 4. Stable emphysematous changes. 5. No findings for upper abdominal metastatic disease. 6. Stable advanced atherosclerotic calcifications involving the aorta and branch vessels including the coronary arteries. 7. Emphysema and aortic atherosclerosis. Aortic Atherosclerosis (ICD10-I70.0) and Emphysema (ICD10-J43.9). Electronically Signed   By: Marijo Sanes M.D.   On: 06/16/2021 10:32        IMPRESSION/PLAN: 1. Putative Stage IA2, cT1bN0M0, NSCLC of the right lower lobe with progressive change in the right infrahilar lung/nodes. The patient's scan was personally reviewed by Dr. Lisbeth Renshaw and he favors close follow up in 3-4 months rather than at a 6 month interval. She will also see Dr. Julien Nordmann today to review his thoughts on surveillance given that her disease recently has been more centrally and that she is at risk of potential progressive changes in the future. She is not in favor of ever taking chemotherapy but is aware of the need for close surveillance. We will plan to follow back up after her next scan. 2. COPD. The patient reports she is still doing well with her inhaled medications. She will follow up with pulmonary medicine as well as PCP. 3. Atherosclerotic changes on CT. this remains stable and will be followed expectantly. 4. L2 compression fracture seen on prior CT. The patient remains asymptomatic at this time. We will follow this expectantly.   Given current concerns for patient exposure during the COVID-19 pandemic, this encounter was conducted via telephone.  The patient has given verbal consent for this type of encounter. The time spent during this encounter was 35 minutes and 50% of that time was spent in the coordination of her care. The attendants for this meeting include Shona Simpson, [facility] and [name]  During the encounter, [facility] was located at home remotely. DELORES EDELSTEIN  was located at  home.     Carola Rhine, PAC

## 2021-06-19 LAB — BETA 2 MICROGLOBULIN, SERUM: Beta-2 Microglobulin: 2.6 mg/L — ABNORMAL HIGH (ref 0.6–2.4)

## 2021-06-19 LAB — IGG, IGA, IGM
IgA: 240 mg/dL (ref 64–422)
IgG (Immunoglobin G), Serum: 746 mg/dL (ref 586–1602)
IgM (Immunoglobulin M), Srm: 175 mg/dL (ref 26–217)

## 2021-06-19 LAB — KAPPA/LAMBDA LIGHT CHAINS
Kappa free light chain: 18.2 mg/L (ref 3.3–19.4)
Kappa, lambda light chain ratio: 1.13 (ref 0.26–1.65)
Lambda free light chains: 16.1 mg/L (ref 5.7–26.3)

## 2021-06-21 ENCOUNTER — Telehealth: Payer: Self-pay | Admitting: Internal Medicine

## 2021-06-21 NOTE — Telephone Encounter (Signed)
Scheduled per los. Called and left msg. Mailed printout  °

## 2021-06-24 ENCOUNTER — Encounter: Payer: Self-pay | Admitting: Internal Medicine

## 2021-06-24 ENCOUNTER — Telehealth: Payer: Self-pay

## 2021-06-24 ENCOUNTER — Ambulatory Visit (INDEPENDENT_AMBULATORY_CARE_PROVIDER_SITE_OTHER): Payer: Medicare Other | Admitting: Internal Medicine

## 2021-06-24 ENCOUNTER — Other Ambulatory Visit: Payer: Self-pay

## 2021-06-24 ENCOUNTER — Other Ambulatory Visit: Payer: Self-pay | Admitting: Physical Medicine and Rehabilitation

## 2021-06-24 VITALS — BP 134/70 | HR 96 | Temp 98.7°F | Ht 59.0 in | Wt 122.8 lb

## 2021-06-24 DIAGNOSIS — G8929 Other chronic pain: Secondary | ICD-10-CM | POA: Diagnosis not present

## 2021-06-24 DIAGNOSIS — G4701 Insomnia due to medical condition: Secondary | ICD-10-CM | POA: Diagnosis not present

## 2021-06-24 DIAGNOSIS — M1612 Unilateral primary osteoarthritis, left hip: Secondary | ICD-10-CM | POA: Diagnosis not present

## 2021-06-24 DIAGNOSIS — K5903 Drug induced constipation: Secondary | ICD-10-CM | POA: Diagnosis not present

## 2021-06-24 DIAGNOSIS — T402X5A Adverse effect of other opioids, initial encounter: Secondary | ICD-10-CM | POA: Diagnosis not present

## 2021-06-24 MED ORDER — OXYCODONE HCL 5 MG PO TABS: 5.0000 mg | ORAL_TABLET | Freq: Four times a day (QID) | ORAL | 0 refills | Status: DC | PRN

## 2021-06-24 MED ORDER — RELISTOR 150 MG PO TABS: 3.0000 | ORAL_TABLET | Freq: Every day | ORAL | 1 refills | Status: DC

## 2021-06-24 NOTE — Telephone Encounter (Signed)
Key: [id number]

## 2021-06-24 NOTE — Patient Instructions (Signed)
Osteoarthritis Osteoarthritis is a type of arthritis. It refers to joint pain or joint disease. Osteoarthritis affects tissue that covers the ends of bones in joints (cartilage). Cartilage acts as a cushion between the bones and helps them move smoothly. Osteoarthritis occurs when cartilage in the joints gets worn down. Osteoarthritis is sometimes called "wear and tear" arthritis. Osteoarthritis is the most common form of arthritis. It often occurs in older people. It is a condition that gets worse over time. The joints most often affected by this condition are in the fingers, toes, hips, knees, and spine, including the neck and lower back. What are the causes? This condition is caused by the wearing down of cartilage that covers the ends of bones. What increases the risk? The following factors may make you more likely to develop this condition: Being age 50 or older. Obesity. Overuse of joints. Past injury of a joint. Past surgery on a joint. Family history of osteoarthritis. What are the signs or symptoms? The main symptoms of this condition are pain, swelling, and stiffness in the joint. Other symptoms may include: An enlarged joint. More pain and further damage caused by small pieces of bone or cartilage that break off and float inside of the joint. Small deposits of bone (osteophytes) that grow on the edges of the joint. A grating or scraping feeling inside the joint when you move it. Popping or creaking sounds when you move. Difficulty walking or exercising. An inability to grip items, twist your hand(s), or control the movements of your hands and fingers. How is this diagnosed? This condition may be diagnosed based on: Your medical history. A physical exam. Your symptoms. X-rays of the affected joint(s). Blood tests to rule out other types of arthritis. How is this treated? There is no cure for this condition, but treatment can help control pain and improve joint function.  Treatment may include a combination of therapies, such as: Pain relief techniques, such as: Applying heat and cold to the joint. Massage. A form of talk therapy called cognitive behavioral therapy (CBT). This therapy helps you set goals and follow up on the changes that you make. Medicines for pain and inflammation. The medicines can be taken by mouth or applied to the skin. They include: NSAIDs, such as ibuprofen. Prescription medicines. Strong anti-inflammatory medicines (corticosteroids). Certain nutritional supplements. A prescribed exercise program. You may work with a physical therapist. Assistive devices, such as a brace, wrap, splint, specialized glove, or cane. A weight control plan. Surgery, such as: An osteotomy. This is done to reposition the bones and relieve pain or to remove loose pieces of bone and cartilage. Joint replacement surgery. You may need this surgery if you have advanced osteoarthritis. Follow these instructions at home: Activity Rest your affected joints as told by your health care provider. Exercise as told by your health care provider. He or she may recommend specific types of exercise, such as: Strengthening exercises. These are done to strengthen the muscles that support joints affected by arthritis. Aerobic activities. These are exercises, such as brisk walking or water aerobics, that increase your heart rate. Range-of-motion activities. These help your joints move more easily. Balance and agility exercises. Managing pain, stiffness, and swelling   If directed, apply heat to the affected area as often as told by your health care provider. Use the heat source that your health care provider recommends, such as a moist heat pack or a heating pad. If you have a removable assistive device, remove it as told by   your health care provider. Place a towel between your skin and the heat source. If your health care provider tells you to keep the assistive device on  while you apply heat, place a towel between the assistive device and the heat source. Leave the heat on for 20-30 minutes. Remove the heat if your skin turns bright red. This is especially important if you are unable to feel pain, heat, or cold. You may have a greater risk of getting burned. If directed, put ice on the affected area. To do this: If you have a removable assistive device, remove it as told by your health care provider. Put ice in a plastic bag. Place a towel between your skin and the bag. If your health care provider tells you to keep the assistive device on during icing, place a towel between the assistive device and the bag. Leave the ice on for 20 minutes, 2-3 times a day. Move your fingers or toes often to reduce stiffness and swelling. Raise (elevate) the injured area above the level of your heart while you are sitting or lying down. General instructions Take over-the-counter and prescription medicines only as told by your health care provider. Maintain a healthy weight. Follow instructions from your health care provider for weight control. Do not use any products that contain nicotine or tobacco, such as cigarettes, e-cigarettes, and chewing tobacco. If you need help quitting, ask your health care provider. Use assistive devices as told by your health care provider. Keep all follow-up visits as told by your health care provider. This is important. Where to find more information National Institute of Arthritis and Musculoskeletal and Skin Diseases: www.niams.nih.gov National Institute on Aging: www.nia.nih.gov American College of Rheumatology: www.rheumatology.org Contact a health care provider if: You have redness, swelling, or a feeling of warmth in a joint that gets worse. You have a fever along with joint or muscle aches. You develop a rash. You have trouble doing your normal activities. Get help right away if: You have pain that gets worse and is not relieved by  pain medicine. Summary Osteoarthritis is a type of arthritis that affects tissue covering the ends of bones in joints (cartilage). This condition is caused by the wearing down of cartilage that covers the ends of bones. The main symptom of this condition is pain, swelling, and stiffness in the joint. There is no cure for this condition, but treatment can help control pain and improve joint function. This information is not intended to replace advice given to you by your health care provider. Make sure you discuss any questions you have with your health care provider. Document Revised: 11/21/2019 Document Reviewed: 11/21/2019 Elsevier Patient Education  2022 [employer]  

## 2021-06-24 NOTE — Progress Notes (Signed)
Subjective:  Patient ID: Heather [name], female    DOB: [birth year]  Age: 86 y.o. MRN: [id number]  CC: Office Visit (Transfer of care)  This visit occurred during the SARS-CoV-2 public health emergency.  Safety protocols were in place, including screening questions prior to the visit, additional usage of staff PPE, and extensive cleaning of exam room while observing appropriate contact time as indicated for disinfecting solutions.    HPI KEVIN MARIO presents for f/up and to establish.  She complains of chronic left hip pain due to arthritis.  She is taking tramadol but is not getting much symptom relief.  The tramadol is caused constipation.  She tells me the pain interferes with her sleep and daily activities.  Outpatient Medications Prior to Visit  Medication Sig Dispense Refill   Albuterol Sulfate (PROAIR RESPICLICK) 782 (90 Base) MCG/ACT AEPB Inhale 2 puffs into the lungs 4 (four) times daily as needed. (Patient taking differently: Inhale 2 puffs into the lungs 4 (four) times daily as needed (sob and wheezing).) 1 each 6   BREO ELLIPTA 200-25 MCG/INH AEPB INHALE 1 PUFF INTO THE LUNGS ONCE DAILY. 180 each 0   Cholecalciferol (VITAMIN D3 PO) Take 1 tablet by mouth daily.     clorazepate (TRANXENE) 7.5 MG tablet TAKE (1) TABLET TWICE DAILY AS NEEDED FOR ANXIETY. 60 tablet 5   docusate sodium (COLACE) 250 MG capsule Take 250 mg by mouth daily.     doxepin (SINEQUAN) 10 MG capsule TAKE 1 CAPSULE AT BEDTIME AS NEEDED. 90 capsule 0   esomeprazole (NEXIUM) 40 MG capsule TAKE 1 CAPSULE DAILY. 90 capsule 0   folic acid (FOLVITE) 1 MG tablet TAKE 1 TABLET ONCE DAILY. 90 tablet 0   furosemide (LASIX) 20 MG tablet TAKE 1 TABLET DAILY IF NEEDED FOR SIGNIFICANT LOWER EXTREMITY SWELLING. 90 tablet 0   gabapentin (NEURONTIN) 100 MG capsule TAKE (1) CAPSULE THREE TIMES DAILY. 90 capsule 0   levothyroxine (SYNTHROID) 25 MCG tablet TAKE 1 TABLET DAILY BEFORE BREAKFAST. 90 tablet 0   losartan  (COZAAR) 25 MG tablet TAKE (1) TABLET DAILY FOR HIGH BLOOD PRESSURE. 90 tablet 0   MYRBETRIQ 25 MG TB24 tablet TAKE 1 TABLET ONCE DAILY. 30 tablet 3   oxybutynin (DITROPAN-XL) 5 MG 24 hr tablet TAKE 1 TABLET IN THE MORNING FOR BLADDER CONTROL. 90 tablet 0   potassium chloride SA (KLOR-CON) 20 MEQ tablet TAKE 1 TABLET ONCE DAILY. 90 tablet 0   promethazine (PHENERGAN) 25 MG tablet TAKE 1 TABLET EVERY 8 HOURS AS NEEDED FOR NAUSEA & VOMITING. 30 tablet 1   sertraline (ZOLOFT) 50 MG tablet TAKE 1 TABLET EACH DAY. 90 tablet 0   VITAMIN D-1000 MAX ST 25 MCG (1000 UT) tablet Take 1,000 Units by mouth daily.     amLODipine (NORVASC) 10 MG tablet TAKE (1) TABLET DAILY FOR HIGH BLOOD PRESSURE. 90 tablet 0   pregabalin (LYRICA) 25 MG capsule TAKE (1) CAPSULE TWICE DAILY. 60 capsule 3   senna-docusate (SENOKOT-S) 8.6-50 MG tablet Take 2 tablets by mouth at bedtime. 60 tablet 2   traMADol (ULTRAM) 50 MG tablet TAKE ONE TABLET IN THE MORNING, ONE TABLET AT NOON, AND 1 TABLET IN THE EVENING. 90 tablet 5   0.9 %  sodium chloride infusion      No facility-administered medications prior to visit.    ROS Review of Systems  Constitutional:  Negative for diaphoresis and fatigue.  HENT: Negative.    Eyes: Negative.   Respiratory:  Negative for  cough, shortness of breath and wheezing.   Cardiovascular:  Negative for chest pain, palpitations and leg swelling.  Gastrointestinal:  Positive for constipation. Negative for abdominal pain and nausea.  Endocrine: Negative.   Genitourinary: Negative.  Negative for difficulty urinating.  Musculoskeletal:  Positive for arthralgias.  Skin: Negative.   Neurological:  Negative for dizziness, weakness and light-headedness.  Hematological:  Negative for adenopathy. Does not bruise/bleed easily.  Psychiatric/Behavioral: Negative.     Objective:  BP 134/70 (BP Location: Left Arm, Patient Position: Sitting, Cuff Size: Large)   Pulse 96   Temp 98.7 F (37.1 C) (Oral)   Ht  4\' 11"  (1.499 m)   Wt 122 lb 12.8 oz (55.7 kg)   SpO2 96%   BMI 24.80 kg/m   BP Readings from Last 3 Encounters:  06/24/21 134/70  06/18/21 116/62  05/28/21 138/69    Wt Readings from Last 3 Encounters:  06/26/21 120 lb (54.4 kg)  06/24/21 122 lb 12.8 oz (55.7 kg)  06/18/21 121 lb (54.9 kg)    Physical Exam Vitals reviewed.  Constitutional:      Appearance: She is ill-appearing (in a wheelchair).  HENT:     Nose: Nose normal.     Mouth/Throat:     Mouth: Mucous membranes are moist.  Eyes:     Conjunctiva/sclera: Conjunctivae normal.  Cardiovascular:     Rate and Rhythm: Normal rate and regular rhythm.     Heart sounds: No murmur heard. Pulmonary:     Effort: Pulmonary effort is normal.     Breath sounds: No stridor. No wheezing, rhonchi or rales.  Abdominal:     General: Abdomen is flat.     Palpations: There is no mass.     Tenderness: There is no abdominal tenderness. There is no guarding.  Musculoskeletal:     Cervical back: Neck supple.  Lymphadenopathy:     Cervical: No cervical adenopathy.  Skin:    General: Skin is warm and dry.  Neurological:     General: No focal deficit present.     Mental Status: She is alert.  Psychiatric:        Mood and Affect: Mood normal.        Behavior: Behavior normal.    Lab Results  Component Value Date   WBC 7.0 06/18/2021   HGB 11.9 (L) 06/18/2021   HCT 37.5 06/18/2021   PLT 291 06/18/2021   GLUCOSE 101 (H) 04/10/2021   CHOL 234 (H) 01/26/2018   TRIG 112.0 01/26/2018   HDL 81.90 01/26/2018   LDLDIRECT 118.2 03/29/2013   LDLCALC 130 (H) 01/26/2018   ALT 7 04/10/2021   AST 16 04/10/2021   NA 138 04/10/2021   K 4.1 04/10/2021   CL 103 04/10/2021   CREATININE 0.70 06/14/2021   BUN 9 04/10/2021   CO2 26 04/10/2021   TSH 2.18 03/25/2021   HGBA1C 5.7 06/21/2019    No results found.  Assessment & Plan:   Kamea was seen today for office visit.  Diagnoses and all orders for this visit:  Insomnia  secondary to chronic pain -     oxyCODONE (OXY IR/ROXICODONE) 5 MG immediate release tablet; Take 1 tablet (5 mg total) by mouth every 6 (six) hours as needed for severe pain.  Primary osteoarthritis of left hip- Will try to get better control of the pain with oxycodone. -     oxyCODONE (OXY IR/ROXICODONE) 5 MG immediate release tablet; Take 1 tablet (5 mg total) by mouth every 6 (  six) hours as needed for severe pain.  Therapeutic opioid-induced constipation (OIC)- I think he would benefit from a PAMORA. -     Methylnaltrexone Bromide (RELISTOR) 150 MG TABS; Take 3 tablets by mouth daily.  I have discontinued Jaylina Ramdass. Hindley "Gerri"'s senna-docusate, traMADol, and amLODipine. I am also having her start on Relistor and oxyCODONE. Additionally, I am having her maintain her Albuterol Sulfate, furosemide, Myrbetriq, Cholecalciferol (VITAMIN D3 PO), Vitamin D-1000 Max St, gabapentin, clorazepate, promethazine, oxybutynin, sertraline, Breo Ellipta, esomeprazole, doxepin, levothyroxine, folic acid, potassium chloride SA, losartan, and docusate sodium. We will stop administering sodium chloride.  Meds ordered this encounter  Medications   Methylnaltrexone Bromide (RELISTOR) 150 MG TABS    Sig: Take 3 tablets by mouth daily.    Dispense:  270 tablet    Refill:  1   oxyCODONE (OXY IR/ROXICODONE) 5 MG immediate release tablet    Sig: Take 1 tablet (5 mg total) by mouth every 6 (six) hours as needed for severe pain.    Dispense:  90 tablet    Refill:  0     Follow-up: Return in about 4 months (around 10/25/2021).  Scarlette Calico, MD

## 2021-06-24 NOTE — Telephone Encounter (Signed)
approved through 12/25/2021.

## 2021-06-26 ENCOUNTER — Other Ambulatory Visit: Payer: Self-pay

## 2021-06-26 ENCOUNTER — Ambulatory Visit (AMBULATORY_SURGERY_CENTER): Payer: Self-pay | Admitting: *Deleted

## 2021-06-26 VITALS — Ht 59.0 in | Wt 120.0 lb

## 2021-06-26 DIAGNOSIS — D509 Iron deficiency anemia, unspecified: Secondary | ICD-10-CM

## 2021-06-26 DIAGNOSIS — K5900 Constipation, unspecified: Secondary | ICD-10-CM

## 2021-06-26 MED ORDER — CLENPIQ 10-3.5-12 MG-GM -GM/160ML PO SOLN: 1.0000 | ORAL | 0 refills | Status: DC

## 2021-06-26 NOTE — Progress Notes (Signed)
Patient and son is here in-person for PV. Patient denies any allergies to eggs or soy. Patient denies any problems with anesthesia/sedation. Patient denies any oxygen use at home. Patient denies taking any diet/weight loss medications or blood thinners. Patient is not being treated for MRSA or C-diff. Patient is aware of our care-partner policy and [id number]-63 safety protocol.  Patient is COVID-19 vaccinated, per patient.   Patient will try to use the Clenpiq 2 day prep and she will use phenergan for nausea-she has this at home. Pt aware to call our 24 hr # if she has prep problems.

## 2021-07-01 ENCOUNTER — Other Ambulatory Visit: Payer: Self-pay | Admitting: Internal Medicine

## 2021-07-01 ENCOUNTER — Telehealth: Payer: Self-pay

## 2021-07-01 DIAGNOSIS — K5904 Chronic idiopathic constipation: Secondary | ICD-10-CM | POA: Insufficient documentation

## 2021-07-01 MED ORDER — TRULANCE 3 MG PO TABS: 1.0000 | ORAL_TABLET | Freq: Every day | ORAL | 1 refills | Status: DC

## 2021-07-01 NOTE — Progress Notes (Unsigned)
cic

## 2021-07-01 NOTE — Telephone Encounter (Signed)
pt has stated she is unable to move her bowels since Friday. However, pt has stated she did take the medication that was rx'd to her and it did not help. Pt states she took Methylnaltrexone Bromide on Friday and then pt states she took 4 ducolax this morning at 4am and was able to go. Pt states she had an episode of vomiting before taking the ducolax due to the constipation and she states she is very bloated and is in pain.  **Pt wants to know if she should continue to take the Methylnaltrexone Bromide or not? Please give pt a call to inform her of what to do going forward.

## 2021-07-01 NOTE — Telephone Encounter (Signed)
Pt has been informed and expressed understanding.  

## 2021-07-08 ENCOUNTER — Encounter: Payer: Self-pay | Admitting: Physician Assistant

## 2021-07-09 ENCOUNTER — Telehealth: Payer: Self-pay | Admitting: Internal Medicine

## 2021-07-09 NOTE — Telephone Encounter (Signed)
Called pt to schedule AWV with [facility]. Patient asked if I can call her next week.

## 2021-07-16 ENCOUNTER — Encounter: Payer: Self-pay | Admitting: Gastroenterology

## 2021-07-16 ENCOUNTER — Ambulatory Visit (AMBULATORY_SURGERY_CENTER): Payer: Medicare Other | Admitting: Gastroenterology

## 2021-07-16 ENCOUNTER — Other Ambulatory Visit: Payer: Self-pay

## 2021-07-16 VITALS — BP 154/66 | HR 70 | Temp 97.5°F | Resp 20 | Ht 59.0 in | Wt 120.0 lb

## 2021-07-16 DIAGNOSIS — K5730 Diverticulosis of large intestine without perforation or abscess without bleeding: Secondary | ICD-10-CM | POA: Diagnosis not present

## 2021-07-16 DIAGNOSIS — K648 Other hemorrhoids: Secondary | ICD-10-CM

## 2021-07-16 DIAGNOSIS — K5900 Constipation, unspecified: Secondary | ICD-10-CM

## 2021-07-16 DIAGNOSIS — D509 Iron deficiency anemia, unspecified: Secondary | ICD-10-CM

## 2021-07-16 HISTORY — PX: COLONOSCOPY: SHX174

## 2021-07-16 MED ORDER — SODIUM CHLORIDE 0.9 % IV SOLN: 500.0000 mL | Freq: Once | INTRAVENOUS | Status: DC

## 2021-07-16 NOTE — Patient Instructions (Signed)
Handouts provided on diverticulosis, hemorrhoids and high-fiber diet.   Resume previous high-fiber diet.   YOU HAD AN ENDOSCOPIC PROCEDURE TODAY AT [facility]:   Refer to the procedure report that was given to you for any specific questions about what was found during the examination.  If the procedure report does not answer your questions, please call your gastroenterologist to clarify.  If you requested that your care partner not be given the details of your procedure findings, then the procedure report has been included in a sealed envelope for you to review at your convenience later.  YOU SHOULD EXPECT: Some feelings of bloating in the abdomen. Passage of more gas than usual.  Walking can help get rid of the air that was put into your GI tract during the procedure and reduce the bloating. If you had a lower endoscopy (such as a colonoscopy or flexible sigmoidoscopy) you may notice spotting of blood in your stool or on the toilet paper. If you underwent a bowel prep for your procedure, you may not have a normal bowel movement for a few days.  Please Note:  You might notice some irritation and congestion in your nose or some drainage.  This is from the oxygen used during your procedure.  There is no need for concern and it should clear up in a day or so.  SYMPTOMS TO REPORT IMMEDIATELY:  Following lower endoscopy (colonoscopy or flexible sigmoidoscopy):  Excessive amounts of blood in the stool  Significant tenderness or worsening of abdominal pains  Swelling of the abdomen that is new, acute  Fever of 100F or higher  For urgent or emergent issues, a gastroenterologist can be reached at any hour by calling [phone]. Do not use MyChart messaging for urgent concerns.    DIET:  We do recommend a small meal at first, but then you may proceed to your regular diet.  Drink plenty of fluids but you should avoid alcoholic beverages for 24 hours.  ACTIVITY:  You should plan to  take it easy for the rest of today and you should NOT DRIVE or use heavy machinery until tomorrow (because of the sedation medicines used during the test).    FOLLOW UP: Our staff will call the number listed on your records 48-72 hours following your procedure to check on you and address any questions or concerns that you may have regarding the information given to you following your procedure. If we do not reach you, we will leave a message.  We will attempt to reach you two times.  During this call, we will ask if you have developed any symptoms of COVID 19. If you develop any symptoms (ie: fever, flu-like symptoms, shortness of breath, cough etc.) before then, please call [phone].  If you test positive for Covid 19 in the 2 weeks post procedure, please call and report this information to [place].    If any biopsies were taken you will be contacted by phone or by letter within the next 1-3 weeks.  Please call us at [phone] if you have not heard about the biopsies in 3 weeks.    SIGNATURES/CONFIDENTIALITY: You and/or your care partner have signed paperwork which will be entered into your electronic medical record.  These signatures attest to the fact that that the information above on your After Visit Summary has been reviewed and is understood.  Full responsibility of the confidentiality of this discharge information lies with you and/or your care-partner.

## 2021-07-16 NOTE — Progress Notes (Signed)
Pt having abdominal pain and distention after colonoscopy. Only able to pass a minimal amount of gas. RN attempted repositioning side to side, abdominal massaging and Levsin 0.25mg  SL. Pt continues to have abdominal pain and distention after these measures. MD made aware. Plan to use colonoscopy scope to attempt to remove more air. MD updated pt and her care partner.

## 2021-07-16 NOTE — Progress Notes (Signed)
PT taken to PACU. Monitors in place. VSS. Report given to RN.    1615: PT back in room to remove residual air. No sedation given. VS monitored throughout. Pt awake and alert.

## 2021-07-16 NOTE — Op Note (Addendum)
[facility] Patient Name: Heather [name] Procedure Date: 07/16/2021 2:23 PM MRN: [id number] Endoscopist: Heather [name] , MD Age: 86 Referring MD:  Date of Birth: 04-29-1935 Gender: Female Account #: [id number] Procedure:                Colonoscopy Indications:              Unexplained iron deficiency anemia Medicines:                Monitored Anesthesia Care Procedure:                Pre-Anesthesia Assessment:                           - Prior to the procedure, a History and Physical                            was performed, and patient medications and                            allergies were reviewed. The patient's tolerance of                            previous anesthesia was also reviewed. The risks                            and benefits of the procedure and the sedation                            options and risks were discussed with the patient.                            All questions were answered, and informed consent                            was obtained. Prior Anticoagulants: The patient has                            taken no previous anticoagulant or antiplatelet                            agents. ASA Grade Assessment: III - A patient with                            severe systemic disease. After reviewing the risks                            and benefits, the patient was deemed in                            satisfactory condition to undergo the procedure.                           After obtaining informed consent, the colonoscope  was passed under direct vision. Throughout the                            procedure, the patient's blood pressure, pulse, and                            oxygen saturations were monitored continuously. The                            PCF-HQ190L Colonoscope was introduced through the                            anus and advanced to the the cecum, identified by                            appendiceal orifice  and ileocecal valve. The                            colonoscopy was performed without difficulty. The                            patient tolerated the procedure well. The quality                            of the bowel preparation was good after 2-day prep.                            The ileocecal valve, appendiceal orifice, and                            rectum were photographed. TI could not be intubated. Scope In: 3:11:03 PM Scope Out: 3:24:49 PM Scope Withdrawal Time: 0 hours 10 minutes 7 seconds  Total Procedure Duration: 0 hours 13 minutes 46 seconds  Findings:                 Multiple medium-mouthed diverticula were found in                            the sigmoid colon, few in transverse colon and                            ascending colon.                           Non-bleeding internal hemorrhoids were found during                            retroflexion. The hemorrhoids were moderate.                           The exam was otherwise without abnormality on                            direct and retroflexion views. Complications:  No immediate complications. Estimated Blood Loss:     Estimated blood loss: none. Impression:               - Pancolonic diverticulosis predominantly in the                            sigmoid colon.                           - Non-bleeding internal hemorrhoids.                           - The examination was otherwise normal on direct                            and retroflexion views.                           - No specimens collected. Recommendation:           - Patient has a contact number available for                            emergencies. The signs and symptoms of potential                            delayed complications were discussed with the                            patient. Return to normal activities tomorrow.                            Written discharge instructions were provided to the                            patient.                            - Resume previous high-fiber diet.                           - Continue present medications.                           - Repeat colonoscopy is not recommended for                            screening purposes.                           - Return to GI clinic PRN.                           - The findings and recommendations were discussed                            with the patient's family.  Addendum: Patient was taken back from recovery and                            decompressed endoscopically. She felt much better                            thereafter. The abdomen was soft. Discussed with                            patient's son in detail. Heather Denmark, MD 07/16/2021 3:29:23 PM This report has been signed electronically.

## 2021-07-16 NOTE — Progress Notes (Signed)
DT - VS  Pt's states no medical or surgical changes since previsit or office visit.   Pt has chronic left hip arthritic pain. maw

## 2021-07-16 NOTE — Progress Notes (Signed)
Pt returned to PACU after decompression of air from abdomen post colonoscopy. Abdomen soft. Pt reports discomfort is improved and is back to her baseline prior to procedure. VSS. Pt alert. Discharge instructions reviewed with pt and her son.

## 2021-07-16 NOTE — Progress Notes (Signed)
PRE-colon   ASSESSMENT AND PLAN;   1.Iron deficiency anemia, and complaints of constipation and chronic GERD, upper abdominal discomfort with bloating Previous history of collagenous colitis remote with negative colonoscopy 2014 per Dr. [name] the exception of scattered diverticulosis and a hyperplastic polyp.  EGD at that same time was unremarkable.  Duodenal biopsies negative   Etiology of iron deficiency is not clear rule out occult upper versus lower GI blood loss   #2 history of lung cancer status post SBRT 2021 3.  COPD no oxygen use 4.  Hypertension 5.  Coronary artery disease   For colon today. Prev colon poor prep Neg EGD 05/2021   HPI:    Heather [name] is a 86 y.o. female  For colon   Past Medical History:  Diagnosis Date   ANXIETY 07/23/2009   Atherosclerosis of aorta ([place]) 0/37/0964   Complication of anesthesia 7 or 8 yrs ago   woke up during colonscopy   COPD (chronic obstructive pulmonary disease) ([place])    Coronary atherosclerosis 07/24/2015   DEPRESSION 07/23/2009   DIVERTICULOSIS, COLON 07/23/2009   GERD 07/23/2009   Headache(784.0) 07/23/2009   occasional   Hemorrhoids    HIP PAIN, LEFT 05/16/2010   History of Crohn's disease    HYPERLIPIDEMIA 07/23/2009   HYPERTENSION 07/23/2009   HYPOTHYROIDISM 07/23/2009   IBS (irritable bowel syndrome)    Insomnia    LOW BACK PAIN, CHRONIC 10/01/2009   lung ca dx'd 2019   SBRT comp 12/2018   PARESTHESIA 10/01/2009   Shingles 2006   back    Past Surgical History:  Procedure Laterality Date   ABDOMINAL HYSTERECTOMY  age 18 or 32   APPENDECTOMY  yrs ago   BILATERAL SALPINGOOPHORECTOMY  age 86 or 78   COLONOSCOPY N/A 11/17/2013   Procedure: COLONOSCOPY;  Surgeon: Cleotis Nipper, MD;  Location: [facility];  Service: Endoscopy;  Laterality: N/A;   ESOPHAGOGASTRODUODENOSCOPY N/A 11/17/2013   Procedure: ESOPHAGOGASTRODUODENOSCOPY (EGD);  Surgeon: Cleotis Nipper, MD;  Location: [facility];  Service:  Endoscopy;  Laterality: N/A;   HEMORRHOID SURGERY  yrs ago   TONSILLECTOMY  yrs ago    Family History  Problem Relation Age of Onset   Cancer Mother        Unknown type   Cancer Sister        Breast   Clotting disorder Neg Hx     Social History   Tobacco Use   Smoking status: Former    Packs/day: 0.50    Types: Cigarettes    Start date: 07/16/1960    Quit date: 07/16/1998    Years since quitting: 23.0   Smokeless tobacco: Never  Vaping Use   Vaping Use: Never used  Substance Use Topics   Alcohol use: No    Alcohol/week: 0.0 standard drinks   Drug use: No    Current Outpatient Medications  Medication Sig Dispense Refill   Albuterol Sulfate (PROAIR RESPICLICK) 383 (90 Base) MCG/ACT AEPB Inhale 2 puffs into the lungs 4 (four) times daily as needed. (Patient taking differently: Inhale 2 puffs into the lungs 4 (four) times daily as needed (sob and wheezing).) 1 each 6   BREO ELLIPTA 200-25 MCG/INH AEPB INHALE 1 PUFF INTO THE LUNGS ONCE DAILY. 180 each 0   Cholecalciferol (VITAMIN D3 PO) Take 1 tablet by mouth daily.     clorazepate (TRANXENE) 7.5 MG tablet TAKE (1) TABLET TWICE DAILY AS NEEDED FOR ANXIETY. 60 tablet 5   docusate sodium (COLACE)  250 MG capsule Take 250 mg by mouth daily.     doxepin (SINEQUAN) 10 MG capsule TAKE 1 CAPSULE AT BEDTIME AS NEEDED. 90 capsule 0   esomeprazole (NEXIUM) 40 MG capsule TAKE 1 CAPSULE DAILY. 90 capsule 0   folic acid (FOLVITE) 1 MG tablet TAKE 1 TABLET ONCE DAILY. 90 tablet 0   furosemide (LASIX) 20 MG tablet TAKE 1 TABLET DAILY IF NEEDED FOR SIGNIFICANT LOWER EXTREMITY SWELLING. 90 tablet 0   gabapentin (NEURONTIN) 100 MG capsule TAKE (1) CAPSULE THREE TIMES DAILY. 90 capsule 0   levothyroxine (SYNTHROID) 25 MCG tablet TAKE 1 TABLET DAILY BEFORE BREAKFAST. 90 tablet 0   losartan (COZAAR) 25 MG tablet TAKE (1) TABLET DAILY FOR HIGH BLOOD PRESSURE. 90 tablet 0   Methylnaltrexone Bromide (RELISTOR) 150 MG TABS Take 3 tablets by mouth daily.  270 tablet 1   MYRBETRIQ 25 MG TB24 tablet TAKE 1 TABLET ONCE DAILY. 30 tablet 3   oxybutynin (DITROPAN-XL) 5 MG 24 hr tablet TAKE 1 TABLET IN THE MORNING FOR BLADDER CONTROL. 90 tablet 0   oxyCODONE (OXY IR/ROXICODONE) 5 MG immediate release tablet Take 1 tablet (5 mg total) by mouth every 6 (six) hours as needed for severe pain. 90 tablet 0   Plecanatide (TRULANCE) 3 MG TABS Take 1 tablet by mouth daily. 90 tablet 1   potassium chloride SA (KLOR-CON) 20 MEQ tablet TAKE 1 TABLET ONCE DAILY. 90 tablet 0   pregabalin (LYRICA) 25 MG capsule TAKE (1) CAPSULE TWICE DAILY. 60 capsule 3   promethazine (PHENERGAN) 25 MG tablet TAKE 1 TABLET EVERY 8 HOURS AS NEEDED FOR NAUSEA & VOMITING. 30 tablet 1   sertraline (ZOLOFT) 50 MG tablet TAKE 1 TABLET EACH DAY. 90 tablet 0   Sod Picosulfate-Mag Ox-Cit Acd (CLENPIQ) 10-3.5-12 MG-GM -GM/160ML SOLN Take 1 kit by mouth as directed. 320 mL 0   VITAMIN D-1000 MAX ST 25 MCG (1000 UT) tablet Take 1,000 Units by mouth daily.     Current Facility-Administered Medications  Medication Dose Route Frequency Provider Last Rate Last Admin   0.9 %  sodium chloride infusion  500 mL Intravenous Once Jackquline Denmark, MD        Allergies  Allergen Reactions   Tylenol [Acetaminophen] Other (See Comments)    Nightmares   Symbicort [Budesonide-Formoterol Fumarate] Other (See Comments)    Pt felt like her tongue was swollen, could not swallow    Review of Systems:  neg     Physical Exam:    BP (!) 144/75   Pulse 84   Temp (!) 97.5 F (36.4 C)   Ht _0  (1.499 m)   Wt 120 lb (54.4 kg)   SpO2 92%   BMI 24.24 kg/m  Wt Readings from Last 3 Encounters:  07/16/21 120 lb (54.4 kg)  06/26/21 120 lb (54.4 kg)  06/24/21 122 lb 12.8 oz (55.7 kg)   Constitutional:  Well-developed, in no acute distress. Psychiatric: Normal mood and affect. Behavior is normal. HEENT: Pupils normal.  Conjunctivae are normal. No scleral icterus. Neck supple.  Cardiovascular: Normal  rate, regular rhythm. No edema Pulmonary/chest: Effort normal and breath sounds normal. No wheezing, rales or rhonchi. Abdominal: Soft, nondistended. Nontender. Bowel sounds active throughout. There are no masses palpable. No hepatomegaly. Rectal: Deferred Neurological: Alert and oriented to person place and time. Skin: Skin is warm and dry. No rashes noted.  Data Reviewed: I have personally reviewed following labs and imaging studies  CBC: CBC Latest Ref Rng & Units 06/18/2021  04/10/2021 03/25/2021  WBC 4.0 - 10.5 K/uL 7.0 7.5 7.0  Hemoglobin 12.0 - 15.0 g/dL 11.9(L) 9.5(L) 9.7(L)  Hematocrit 36.0 - 46.0 % 37.5 32.2(L) 31.6(L)  Platelets 150 - 400 K/uL 291 379 412.0(H)    CMP: CMP Latest Ref Rng & Units 06/14/2021 04/10/2021 03/25/2021  Glucose 70 - 99 mg/dL - 101(H) 88  BUN 8 - 23 mg/dL - 9 13  Creatinine 0.44 - 1.00 mg/dL 0.70 0.94 0.80  Sodium 135 - 145 mmol/L - 138 138  Potassium 3.5 - 5.1 mmol/L - 4.1 3.6  Chloride 98 - 111 mmol/L - 103 102  CO2 22 - 32 mmol/L - 26 27  Calcium 8.9 - 10.3 mg/dL - 9.4 9.3  Total Protein 6.5 - 8.1 g/dL - 7.6 7.4  Total Bilirubin 0.3 - 1.2 mg/dL - 0.4 0.4  Alkaline Phos 38 - 126 U/L - 85 82  AST 15 - 41 U/L - 16 15  ALT 0 - 44 U/L - 7 8        Carmell Austria, MD 07/16/2021, 2:32 PM  Cc: Marrian Salvage,*

## 2021-07-18 ENCOUNTER — Telehealth: Payer: Self-pay | Admitting: *Deleted

## 2021-07-18 NOTE — Telephone Encounter (Signed)
  Follow up Call-  Call back number 07/16/2021 05/28/2021  Post procedure Call Back phone  # [phone] HM [phone]  Permission to leave phone message Yes Yes  Some recent data might be hidden     Patient questions:  Do you have a fever, pain , or abdominal swelling? No. Pain Score  0 *  Have you tolerated food without any problems? Yes.    Have you been able to return to your normal activities? Yes.    Do you have any questions about your discharge instructions: Diet   No. Medications  No. Follow up visit  No.  Do you have questions or concerns about your Care? No.  Actions: * If pain score is 4 or above: No action needed, pain <4.  Have you developed a fever since your procedure? no  2.   Have you had an respiratory symptoms (SOB or cough) since your procedure? no  3.   Have you tested positive for COVID 19 since your procedure no  4.   Have you had any family members/close contacts diagnosed with the COVID 19 since your procedure?  no   If yes to any of these questions please route to Joylene John, RN and Joella Prince, RN

## 2021-07-22 ENCOUNTER — Other Ambulatory Visit: Payer: Self-pay | Admitting: Family

## 2021-07-29 ENCOUNTER — Ambulatory Visit: Payer: Medicare Other | Admitting: Physical Medicine and Rehabilitation

## 2021-07-31 ENCOUNTER — Ambulatory Visit: Payer: Medicare Other | Admitting: Physical Medicine and Rehabilitation

## 2021-08-02 ENCOUNTER — Other Ambulatory Visit: Payer: Self-pay

## 2021-08-02 ENCOUNTER — Encounter
Payer: Medicare Other | Attending: Physical Medicine and Rehabilitation | Admitting: Physical Medicine and Rehabilitation

## 2021-08-02 ENCOUNTER — Encounter: Payer: Self-pay | Admitting: Physical Medicine and Rehabilitation

## 2021-08-02 VITALS — BP 146/83 | HR 87 | Temp 98.3°F | Ht 59.0 in | Wt 120.0 lb

## 2021-08-02 DIAGNOSIS — M7072 Other bursitis of hip, left hip: Secondary | ICD-10-CM | POA: Insufficient documentation

## 2021-08-02 DIAGNOSIS — M797 Fibromyalgia: Secondary | ICD-10-CM | POA: Diagnosis not present

## 2021-08-02 DIAGNOSIS — G4701 Insomnia due to medical condition: Secondary | ICD-10-CM | POA: Insufficient documentation

## 2021-08-02 NOTE — Progress Notes (Signed)
Subjective:    Patient ID: Heather [name], female    DOB: Jan 19, 1935, 86 y.o.   MRN: [id number]  HPI   Heather [name] is an 86 year old woman who presents for follow-up of of lumbar spinal stenosis  1) Lumbar spinal stenosis:  Her pain is present in bilateral feet and has been present since January. Average and current pain are currently rated as 8/10. The pain is constant and burning. Pain is worst with ambulation and relieved with rest. She also has left hip pain. She uses a cane and a walker and needs assistance with household activities.  -She has been taking the Tramadol.  -this has improved  2) Left hip pain: -this hurts her all the time.  -she has never had steroid injections before  3) Constipation: -She has a bowel movement every other day -If she does not have this, she takes sennakot.  -this is still a big problem  4) Cancer:  -She feels a lot of fatigue -She has scans scheduled every 6 months.   5) Emesis: -since Friday  6) Insomnia: -Never sleeps until after midnight  He mentioned that Trazodone 150mg  I had prescribed in the past for insomnia was too much for her and sedated her. He says that she is currently taking Doxepin for sleep and she has been waking up after 2 hours after taking this. He requested that if she doesn't mention her sleep, not to prescribe anything. -she was up every two hours last night.  -she cannot sleep on her left side.    PDMP reviewed. She was last prescribed Lyrica 25mg  BID and Tramadol 50mg  BID on 8/02 and 7/29 respectively. Last scripts prior to that were 6 months ago. She asks whether the dosage of the Tramadol can be increased.   8/18 note with Dr. Scot Dock reviewed. During this in-office visit, she described burning pain and paresthesias in both feet. He felt her symptoms were most consistent with neuropathy. She has a history of back surgery and Dr. Scot Dock felt her pain could be referred from degenerative disc disease. ABIs  were 100% on his testing.   Pain Inventory Average Pain 6 Pain Right Now 9 My pain is sharp  In the last 24 hours, has pain interfered with the following? General activity 9 Relation with others 9 Enjoyment of life 10 What TIME of day is your pain at its worst? morning  Sleep (in general) Poor  Pain is worse with: walking, bending, sitting, inactivity, standing, and some activites Pain improves with: medication Relief from Meds: 1  use a cane use a walker  I need assistance with the following:  household duties and shopping  dizziness anxiety  Colonoscopy      Any changes since last visit?  yes Primary care Dr.Jones    Family History  Problem Relation Age of Onset   Cancer Mother        Unknown type   Cancer Sister        Breast   Clotting disorder Neg Hx    Social History   Socioeconomic History   Marital status: Widowed    Spouse name: Not on file   Number of children: 1   Years of education: Not on file   Highest education level: Not on file  Occupational History   Not on file  Tobacco Use   Smoking status: Former    Packs/day: 0.50    Types: Cigarettes    Start date: 07/16/1960    Quit  date: 07/16/1998    Years since quitting: 23.0   Smokeless tobacco: Never  Vaping Use   Vaping Use: Never used  Substance and Sexual Activity   Alcohol use: No    Alcohol/week: 0.0 standard drinks   Drug use: No   Sexual activity: Never  Other Topics Concern   Not on file  Social History Narrative   Widowed.  Lives alone in her own home.  Ambulates with a cane/walker when needed.   Social Determinants of Health   Financial Resource Strain: Not on file  Food Insecurity: Not on file  Transportation Needs: Not on file  Physical Activity: Not on file  Stress: Not on file  Social Connections: Not on file   Past Surgical History:  Procedure Laterality Date   ABDOMINAL HYSTERECTOMY  age 87 or 17   APPENDECTOMY  yrs ago   BILATERAL SALPINGOOPHORECTOMY  age 54  or 39   COLONOSCOPY N/A 11/17/2013   Procedure: COLONOSCOPY;  Surgeon: Cleotis Nipper, MD;  Location: [facility];  Service: Endoscopy;  Laterality: N/A;   ESOPHAGOGASTRODUODENOSCOPY N/A 11/17/2013   Procedure: ESOPHAGOGASTRODUODENOSCOPY (EGD);  Surgeon: Cleotis Nipper, MD;  Location: [facility];  Service: Endoscopy;  Laterality: N/A;   HEMORRHOID SURGERY  yrs ago   TONSILLECTOMY  yrs ago   Past Medical History:  Diagnosis Date   ANXIETY 07/23/2009   Atherosclerosis of aorta ([place]) 3/71/0626   Complication of anesthesia 7 or 8 yrs ago   woke up during colonscopy   COPD (chronic obstructive pulmonary disease) ([place])    Coronary atherosclerosis 07/24/2015   DEPRESSION 07/23/2009   DIVERTICULOSIS, COLON 07/23/2009   GERD 07/23/2009   Headache(784.0) 07/23/2009   occasional   Hemorrhoids    HIP PAIN, LEFT 05/16/2010   History of Crohn's disease    HYPERLIPIDEMIA 07/23/2009   HYPERTENSION 07/23/2009   HYPOTHYROIDISM 07/23/2009   IBS (irritable bowel syndrome)    Insomnia    LOW BACK PAIN, CHRONIC 10/01/2009   lung ca dx'd 2019   SBRT comp 12/2018   PARESTHESIA 10/01/2009   Shingles 2006   back   There were no vitals taken for this visit.  Opioid Risk Score:   Fall Risk Score:  `1  Depression screen PHQ 2/9  Depression screen [facility] 2/9 06/24/2021 03/25/2021 07/27/2020 11/10/2019 08/03/2018  Decreased Interest 0 0 0 3 1  Down, Depressed, Hopeless 1 0 0 3 2  PHQ - 2 Score 1 0 0 6 3  Altered sleeping - - - 3 1  Tired, decreased energy - - - 3 2  Change in appetite - - - 3 0  Feeling bad or failure about yourself  - - - 0 1  Trouble concentrating - - - 3 0  Moving slowly or fidgety/restless - - - 1 0  Suicidal thoughts - - - 0 0  PHQ-9 Score - - - 19 7  Difficult doing work/chores - - - Very difficult Not difficult at all  Some recent data might be hidden   Review of Systems  Constitutional: Negative.   HENT: Negative.    Eyes: Negative.   Respiratory: Negative.     Cardiovascular: Negative.   Gastrointestinal: Negative.   Endocrine: Negative.   Genitourinary: Negative.   Musculoskeletal:  Positive for arthralgias and myalgias.       Left hip pain , hurting all over body .   Allergic/Immunologic: Negative.   Neurological:        Burning type pain  Hematological: Negative.  Psychiatric/Behavioral:  The patient is nervous/anxious.   All other systems reviewed and are negative.     Objective:   Physical Exam Gen: no distress, normal appearing HEENT: oral mucosa pink and moist, NCAT Cardio: Reg rate Chest: normal effort, normal rate of breathing Abd: soft, non-distended Ext: no edema Psych: pleasant, normal affect Skin: intact Neuro: Aox3 Musculoskeletal: 5/5 strength throughout, came in in wheelchair    Assessment & Plan:  Heather [name] is an 86 year old woman who presents for follow-up of bilateral painful paraesthesias in her feet that started in January.  1) Chronic Pain Syndrome secondary to lumbar spinal stenosis -Discussed following foods that may reduce pain: 1) Ginger (especially studied for arthritis)- reduce leukotriene production to decrease inflammation 2) Blueberries- high in phytonutrients that decrease inflammation 3) Salmon- marine omega-3s reduce joint swelling and pain 4) Pumpkin seeds- reduce inflammation 5) dark chocolate- reduces inflammation 6) turmeric- reduces inflammation 7) tart cherries - reduce pain and stiffness 8) extra virgin olive oil - its compound olecanthal helps to block prostaglandins  9) chili peppers- can be eaten or applied topically via capsaicin 10) mint- helpful for headache, muscle aches, joint pain, and itching 11) garlic- reduces inflammation  Link to further information on diet for chronic pain: [email or web address]   -Currently her pain is 8/10 and being managed with Lyrica 25mg  BID and Tramadol 50mg  BID PRN. Last  refills provided in early August/late July (PDMP reviewed). Continue Tramadol three times per day. UDS obtained today and pain contract signed.   -MRI 01/15/2010 reviewed and shows mild disc bulging and facet hypertrophy at L4-L5 with resulting mild lateral recess stenosis bilaterally and asymmetric disc bulging on the left and bilateral face hypertrophy at L5-1 which is contributing to mild left foraminal stenosis without definite nerve root encroachment.   -Left hip XR from 02/03/20 reviewed and is normal.   2) Insomnia: -Her son reports insomnia. She was too drowsy with the Trazodone in the past. She is currently on Doxepin. She still wakes every 2 hours with this.  -melatonin did not help 5mg .  -recommended tart cherry juice  -She asks that I also send her Clorazepate 7.5mg  BID PRN for anxiety. She was on this dose prior to her doctor retiring and it really helped with her anxiety.    3) New-onset cancer: -answered her questions regarding the types of treatment she had -Recommended ketogenic diet -Discussed cancer associated fatigue -Discussed home therapy as an option if she would like  4) Left sided greater trochanteric bursitis: -discussed risks and benefits of steroid injection -apply blue emu oil Trochanteric bursa injection With or without ultrasound guidance  Indication Trochanteric bursitis. Exam has tenderness over the greater trochanter of the hip. Pain has not responded to conservative care such as exercise therapy and oral medications. Pain interferes with sleep or with mobility Informed consent was obtained after describing risks and benefits of the procedure with the patient these include bleeding bruising and infection. Patient has signed written consent form. Patient placed in a lateral decubitus position with the affected hip superior. Point of maximal pain was palpated marked and prepped with Betadine and entered with a needle to bone contact. Needle slightly withdrawn  then 6mg  of betamethasone with 4 cc 1% lidocaine were injected. Patient tolerated procedure well. Post procedure instructions given.   5) opioid induced constipation: -take senna nightly -discussed goal of daily BM -Provided list of following foods that help with constipation and highlighted a few: 1) prunes- contain high amounts of fiber.  2) apples- has a form of dietary fiber called pectin that accelerates stool movement and increases beneficial gut bacteria 3) pears- in addition to fiber, also high in fructose and sorbitol which have laxative effect 4) figs- contain an enzyme ficin which helps to speed colonic transit 5) kiwis- contain an enzyme actinidin that improves gut motility and reduces constipation 6) oranges- rich in pectin (like apples) 7) grapefruits- contain a flavanol naringenin which has a laxative effect 8) vegetables- rich in fiber and also great sources of folate, vitamin C, and K 9) artichoke- high in inulin, prebiotic great for the microbiome 10) chicory- increases stool frequency and softness (can be added to coffee) 11) rhubarb- laxative effect 12) sweet potato- high fiber 13) beans, peas, and lentils- contain both soluble and insoluble fiber 14) chia seeds- improves intestinal health and gut flora 15) flaxseeds- laxative effect 16) whole grain rye bread- high in fiber 17) oat bran- high in soluble and insoluble fiber 18) kefir- softens stools -recommended to try at least one of these foods every day.  -drink 6-8 glasses of water per day -walk regularly, especially after meals.

## 2021-08-02 NOTE — Patient Instructions (Signed)
Constipation:  -Provided list of following foods that help with constipation and highlighted a few: 1) prunes- contain high amounts of fiber.  2) apples- has a form of dietary fiber called pectin that accelerates stool movement and increases beneficial gut bacteria 3) pears- in addition to fiber, also high in fructose and sorbitol which have laxative effect 4) figs- contain an enzyme ficin which helps to speed colonic transit 5) kiwis- contain an enzyme actinidin that improves gut motility and reduces constipation 6) oranges- rich in pectin (like apples) 7) grapefruits- contain a flavanol naringenin which has a laxative effect 8) vegetables- rich in fiber and also great sources of folate, vitamin C, and K 9) artichoke- high in inulin, prebiotic great for the microbiome 10) chicory- increases stool frequency and softness (can be added to coffee) 11) rhubarb- laxative effect 12) sweet potato- high fiber 13) beans, peas, and lentils- contain both soluble and insoluble fiber 14) chia seeds- improves intestinal health and gut flora 15) flaxseeds- laxative effect 16) whole grain rye bread- high in fiber 17) oat bran- high in soluble and insoluble fiber 18) kefir- softens stools -recommended to try at least one of these foods every day.  -drink 6-8 glasses of water per day -walk regularly, especially after meals.      -Discussed following foods that may reduce pain: 1) Ginger (especially studied for arthritis)- reduce leukotriene production to decrease inflammation 2) Blueberries- high in phytonutrients that decrease inflammation 3) Salmon- marine omega-3s reduce joint swelling and pain 4) Pumpkin seeds- reduce inflammation 5) dark chocolate- reduces inflammation 6) turmeric- reduces inflammation 7) tart cherries - reduce pain and stiffness 8) extra virgin olive oil - its compound olecanthal helps to block prostaglandins  9) chili peppers- can be eaten or applied topically via  capsaicin 10) mint- helpful for headache, muscle aches, joint pain, and itching 11) garlic- reduces inflammation  Link to further information on diet for chronic pain: [email or web address]

## 2021-08-07 ENCOUNTER — Other Ambulatory Visit: Payer: Self-pay | Admitting: Internal Medicine

## 2021-08-07 DIAGNOSIS — M1612 Unilateral primary osteoarthritis, left hip: Secondary | ICD-10-CM

## 2021-08-07 DIAGNOSIS — G8929 Other chronic pain: Secondary | ICD-10-CM

## 2021-08-21 ENCOUNTER — Other Ambulatory Visit: Payer: Self-pay | Admitting: Physical Medicine and Rehabilitation

## 2021-08-22 ENCOUNTER — Other Ambulatory Visit: Payer: Self-pay | Admitting: Physical Medicine and Rehabilitation

## 2021-08-23 ENCOUNTER — Other Ambulatory Visit: Payer: Self-pay | Admitting: Physical Medicine and Rehabilitation

## 2021-08-28 ENCOUNTER — Other Ambulatory Visit: Payer: Self-pay | Admitting: Family

## 2021-08-28 ENCOUNTER — Other Ambulatory Visit: Payer: Self-pay | Admitting: Internal Medicine

## 2021-08-28 ENCOUNTER — Telehealth: Payer: Self-pay | Admitting: Internal Medicine

## 2021-08-28 DIAGNOSIS — K219 Gastro-esophageal reflux disease without esophagitis: Secondary | ICD-10-CM

## 2021-08-28 DIAGNOSIS — F419 Anxiety disorder, unspecified: Secondary | ICD-10-CM

## 2021-08-28 DIAGNOSIS — E039 Hypothyroidism, unspecified: Secondary | ICD-10-CM

## 2021-08-28 DIAGNOSIS — N3281 Overactive bladder: Secondary | ICD-10-CM

## 2021-08-28 DIAGNOSIS — F32A Depression, unspecified: Secondary | ICD-10-CM

## 2021-08-28 DIAGNOSIS — I2510 Atherosclerotic heart disease of native coronary artery without angina pectoris: Secondary | ICD-10-CM

## 2021-08-28 DIAGNOSIS — J438 Other emphysema: Secondary | ICD-10-CM

## 2021-08-28 DIAGNOSIS — E440 Moderate protein-calorie malnutrition: Secondary | ICD-10-CM

## 2021-08-28 DIAGNOSIS — J209 Acute bronchitis, unspecified: Secondary | ICD-10-CM

## 2021-08-28 DIAGNOSIS — I10 Essential (primary) hypertension: Secondary | ICD-10-CM

## 2021-08-28 MED ORDER — LOSARTAN POTASSIUM 25 MG PO TABS: ORAL_TABLET | ORAL | 0 refills | Status: DC

## 2021-08-28 MED ORDER — ESOMEPRAZOLE MAGNESIUM 40 MG PO CPDR: 40.0000 mg | DELAYED_RELEASE_CAPSULE | Freq: Every day | ORAL | 0 refills | Status: DC

## 2021-08-28 MED ORDER — DOXEPIN HCL 10 MG PO CAPS: ORAL_CAPSULE | ORAL | 0 refills | Status: DC

## 2021-08-28 MED ORDER — FOLIC ACID 1 MG PO TABS: 1.0000 mg | ORAL_TABLET | Freq: Every day | ORAL | 0 refills | Status: DC

## 2021-08-28 MED ORDER — SERTRALINE HCL 50 MG PO TABS: ORAL_TABLET | ORAL | 0 refills | Status: DC

## 2021-08-28 MED ORDER — POTASSIUM CHLORIDE CRYS ER 20 MEQ PO TBCR: 20.0000 meq | EXTENDED_RELEASE_TABLET | Freq: Every day | ORAL | 0 refills | Status: DC

## 2021-08-28 MED ORDER — LEVOTHYROXINE SODIUM 25 MCG PO TABS: 25.0000 ug | ORAL_TABLET | Freq: Every day | ORAL | 0 refills | Status: DC

## 2021-08-28 MED ORDER — OXYBUTYNIN CHLORIDE ER 5 MG PO TB24: ORAL_TABLET | ORAL | 0 refills | Status: DC

## 2021-08-28 MED ORDER — FLUTICASONE FUROATE-VILANTEROL 200-25 MCG/INH IN AEPB: 1.0000 | INHALATION_SPRAY | Freq: Every day | RESPIRATORY_TRACT | 0 refills | Status: DC

## 2021-08-28 NOTE — Telephone Encounter (Signed)
Pharmacy states they did not receive the order for this medication. Requesting it to be resent.   Please advise.

## 2021-08-29 NOTE — Telephone Encounter (Signed)
Pharmacist has been informed that amlodipine was d/c.

## 2021-09-02 ENCOUNTER — Telehealth: Payer: Self-pay | Admitting: Physical Medicine and Rehabilitation

## 2021-09-02 NOTE — Telephone Encounter (Signed)
Having pain in left hip started two weeks ago, also mentioned had some relief from the shot given, would like to see if a medication can be called in for pain.  States Darvocet has helpped her in past

## 2021-09-03 ENCOUNTER — Other Ambulatory Visit: Payer: Self-pay | Admitting: Internal Medicine

## 2021-09-03 ENCOUNTER — Telehealth: Payer: Self-pay | Admitting: Internal Medicine

## 2021-09-03 DIAGNOSIS — H66003 Acute suppurative otitis media without spontaneous rupture of ear drum, bilateral: Secondary | ICD-10-CM | POA: Insufficient documentation

## 2021-09-03 MED ORDER — CEFDINIR 300 MG PO CAPS: 300.0000 mg | ORAL_CAPSULE | Freq: Two times a day (BID) | ORAL | 0 refills | Status: AC

## 2021-09-03 NOTE — Telephone Encounter (Signed)
Patient calling in complaining of ear pain in both ears & also a sore throat  Says she doesn't drive much anymore & wants to know if provider is willing to send something over to pharmacy  Pharmacy:  [place], [place]  Phone:  [phone] Fax:  [phone]

## 2021-09-10 ENCOUNTER — Other Ambulatory Visit: Payer: Self-pay

## 2021-09-10 ENCOUNTER — Encounter
Payer: Medicare Other | Attending: Physical Medicine and Rehabilitation | Admitting: Physical Medicine and Rehabilitation

## 2021-09-10 DIAGNOSIS — G4701 Insomnia due to medical condition: Secondary | ICD-10-CM | POA: Diagnosis not present

## 2021-09-10 DIAGNOSIS — M7062 Trochanteric bursitis, left hip: Secondary | ICD-10-CM | POA: Insufficient documentation

## 2021-09-10 DIAGNOSIS — M7072 Other bursitis of hip, left hip: Secondary | ICD-10-CM | POA: Diagnosis not present

## 2021-09-10 MED ORDER — GABAPENTIN 100 MG PO CAPS: 100.0000 mg | ORAL_CAPSULE | Freq: Three times a day (TID) | ORAL | 0 refills | Status: DC

## 2021-09-10 NOTE — Progress Notes (Signed)
Subjective:    Patient ID: Heather [name], female    DOB: 06/27/1935, 86 y.o.   MRN: [id number]  HPI   An audio/video tele-health visit is felt to be the most appropriate encounter for this patient at this time. This is a follow up tele-visit via phone. The patient is at home. MD is at office.    Heather [name] is an 86 year old woman who presents for follow-up of of lumbar spinal stenosis and left hip bursitis  1) Lumbar spinal stenosis:  Her pain is present in bilateral feet and has been present since January. Average and current pain are currently rated as 8/10. The pain is constant and burning. Pain is worst with ambulation and relieved with rest. She also has left hip pain. She uses a cane and a walker and needs assistance with household activities.  -She has been taking the Tramadol.  -this has improved  2) Left hip pain: -this hurts her all the time.  -she had about 2 weeks of benefit from our last steroid injection -she asks about starting Darvocet which has benefited her in the past.  -hurting severely -she cannot bear weight -she has not tried Gabapentin before.   3) Constipation: -She has a bowel movement every other day -If she does not have this, she takes sennakot.  -this is still a big problem  4) Cancer:  -She feels a lot of fatigue -She has scans scheduled every 6 months.   5) Emesis: -since Friday  6) Insomnia: -Never sleeps until after midnight  He mentioned that Trazodone 150mg  I had prescribed in the past for insomnia was too much for her and sedated her. He says that she is currently taking Doxepin for sleep and she has been waking up after 2 hours after taking this. He requested that if she doesn't mention her sleep, not to prescribe anything. -she was up every two hours last night.  -she cannot sleep on her left side.    PDMP reviewed. She was last prescribed Lyrica 25mg  BID and Tramadol 50mg  BID on 8/02 and 7/29 respectively. Last scripts prior  to that were 6 months ago. She asks whether the dosage of the Tramadol can be increased.   8/18 note with Dr. Scot Dock reviewed. During this in-office visit, she described burning pain and paresthesias in both feet. He felt her symptoms were most consistent with neuropathy. She has a history of back surgery and Dr. Scot Dock felt her pain could be referred from degenerative disc disease. ABIs were 100% on his testing.   Pain Inventory Average Pain 6 Pain Right Now 9 My pain is sharp  In the last 24 hours, has pain interfered with the following? General activity 9 Relation with others 9 Enjoyment of life 10 What TIME of day is your pain at its worst? morning  Sleep (in general) Poor  Pain is worse with: walking, bending, sitting, inactivity, standing, and some activites Pain improves with: medication Relief from Meds: 1  use a cane use a walker  I need assistance with the following:  household duties and shopping  dizziness anxiety  Colonoscopy      Any changes since last visit?  yes Primary care Dr.Jones    Family History  Problem Relation Age of Onset   Cancer Mother        Unknown type   Cancer Sister        Breast   Clotting disorder Neg Hx    Social History  Socioeconomic History   Marital status: Widowed    Spouse name: Not on file   Number of children: 1   Years of education: Not on file   Highest education level: Not on file  Occupational History   Not on file  Tobacco Use   Smoking status: Former    Packs/day: 0.50    Types: Cigarettes    Start date: 07/16/1960    Quit date: 07/16/1998    Years since quitting: 23.1   Smokeless tobacco: Never  Vaping Use   Vaping Use: Never used  Substance and Sexual Activity   Alcohol use: No    Alcohol/week: 0.0 standard drinks   Drug use: No   Sexual activity: Never  Other Topics Concern   Not on file  Social History Narrative   Widowed.  Lives alone in her own home.  Ambulates with a cane/walker when needed.    Social Determinants of Health   Financial Resource Strain: Not on file  Food Insecurity: Not on file  Transportation Needs: Not on file  Physical Activity: Not on file  Stress: Not on file  Social Connections: Not on file   Past Surgical History:  Procedure Laterality Date   ABDOMINAL HYSTERECTOMY  age 61 or 11   APPENDECTOMY  yrs ago   BILATERAL SALPINGOOPHORECTOMY  age 62 or 9   COLONOSCOPY N/A 11/17/2013   Procedure: COLONOSCOPY;  Surgeon: Cleotis Nipper, MD;  Location: [facility];  Service: Endoscopy;  Laterality: N/A;   COLONOSCOPY N/A 07/16/2021   ESOPHAGOGASTRODUODENOSCOPY N/A 11/17/2013   Procedure: ESOPHAGOGASTRODUODENOSCOPY (EGD);  Surgeon: Cleotis Nipper, MD;  Location: [facility];  Service: Endoscopy;  Laterality: N/A;   HEMORRHOID SURGERY  yrs ago   TONSILLECTOMY  yrs ago   Past Medical History:  Diagnosis Date   ANXIETY 07/23/2009   Atherosclerosis of aorta ([place]) 7/82/4235   Complication of anesthesia 7 or 8 yrs ago   woke up during colonscopy   COPD (chronic obstructive pulmonary disease) ([place])    Coronary atherosclerosis 07/24/2015   DEPRESSION 07/23/2009   DIVERTICULOSIS, COLON 07/23/2009   GERD 07/23/2009   Headache(784.0) 07/23/2009   occasional   Hemorrhoids    HIP PAIN, LEFT 05/16/2010   History of Crohn's disease    HYPERLIPIDEMIA 07/23/2009   HYPERTENSION 07/23/2009   HYPOTHYROIDISM 07/23/2009   IBS (irritable bowel syndrome)    Insomnia    LOW BACK PAIN, CHRONIC 10/01/2009   lung ca dx'd 2019   SBRT comp 12/2018   PARESTHESIA 10/01/2009   Shingles 2006   back   There were no vitals taken for this visit.  Opioid Risk Score:   Fall Risk Score:  `1  Depression screen PHQ 2/9  Depression screen [facility] 2/9 08/02/2021 06/24/2021 03/25/2021 07/27/2020 11/10/2019 08/03/2018  Decreased Interest 1 0 0 0 3 1  Down, Depressed, Hopeless 1 1 0 0 3 2  PHQ - 2 Score 2 1 0 0 6 3  Altered sleeping - - - - 3 1  Tired, decreased energy - - - - 3 2  Change in  appetite - - - - 3 0  Feeling bad or failure about yourself  - - - - 0 1  Trouble concentrating - - - - 3 0  Moving slowly or fidgety/restless - - - - 1 0  Suicidal thoughts - - - - 0 0  PHQ-9 Score - - - - 19 7  Difficult doing work/chores - - - - Very difficult Not difficult at all  Some recent data might be hidden   Review of Systems  Constitutional: Negative.   HENT: Negative.    Eyes: Negative.   Respiratory: Negative.    Cardiovascular: Negative.   Gastrointestinal: Negative.   Endocrine: Negative.   Genitourinary: Negative.   Musculoskeletal:  Positive for arthralgias and myalgias.       Left hip pain , hurting all over body .   Allergic/Immunologic: Negative.   Neurological:        Burning type pain  Hematological: Negative.   Psychiatric/Behavioral:  The patient is nervous/anxious.   All other systems reviewed and are negative.     Objective:   Physical Exam Not performed as patient is seen by phone.     Assessment & Plan:  Heather [name] is an 86 year old woman who presents for follow-up of bilateral painful paraesthesias in her feet that started in January.  1) Chronic Pain Syndrome secondary to lumbar spinal stenosis -Discussed following foods that may reduce pain: 1) Ginger (especially studied for arthritis)- reduce leukotriene production to decrease inflammation 2) Blueberries- high in phytonutrients that decrease inflammation 3) Salmon- marine omega-3s reduce joint swelling and pain 4) Pumpkin seeds- reduce inflammation 5) dark chocolate- reduces inflammation 6) turmeric- reduces inflammation 7) tart cherries - reduce pain and stiffness 8) extra virgin olive oil - its compound olecanthal helps to block prostaglandins  9) chili peppers- can be eaten or applied topically via capsaicin 10) mint- helpful for headache, muscle aches, joint pain, and itching 11) garlic- reduces inflammation  Link to further information on diet for chronic pain:  [email or web address].[email or web address]   -Currently her pain is 8/10 and being managed with Lyrica 25mg  BID and Tramadol 50mg  BID PRN. Last refills provided in early August/late July (PDMP reviewed). Continue Tramadol three times per day. UDS obtained today and pain contract signed.   -MRI 01/15/2010 reviewed and shows mild disc bulging and facet hypertrophy at L4-L5 with resulting mild lateral recess stenosis bilaterally and asymmetric disc bulging on the left and bilateral face hypertrophy at L5-1 which is contributing to mild left foraminal stenosis without definite nerve root encroachment.   -Left hip XR from 02/03/20 reviewed and is normal.   2) Insomnia: -Her son reports insomnia. She was too drowsy with the Trazodone in the past. She is currently on Doxepin. She still wakes every 2 hours with this.  -melatonin did not help 5mg .  -recommended tart cherry juice  -She asks that I also send her Clorazepate 7.5mg  BID PRN for anxiety. She was on this dose prior to her doctor retiring and it really helped with her anxiety.    3) New-onset cancer: -answered her questions regarding the types of treatment she had -Recommended ketogenic diet -Discussed cancer associated fatigue -Discussed home therapy as an option if she would like  4) Left sided greater trochanteric bursitis: -discussed risks and benefits of steroid injection -apply blue emu oil Trochanteric bursa injection With or without ultrasound guidance -she was taken off the tramadol and started on oxycodone TID PRN which is not also helping much -start gabapentin 100mg  TID. Advised her to give me a call in 3 days if this in not effective.   Indication Trochanteric bursitis. Exam has tenderness over the greater trochanter of the hip. Pain has not responded to conservative care such as exercise therapy and oral medications. Pain interferes with sleep or with mobility Informed  consent was obtained after describing risks and benefits of the procedure with the patient these include bleeding bruising and infection.  Patient has signed written consent form. Patient placed in a lateral decubitus position with the affected hip superior. Point of maximal pain was palpated marked and prepped with Betadine and entered with a needle to bone contact. Needle slightly withdrawn then 6mg  of betamethasone with 4 cc 1% lidocaine were injected. Patient tolerated procedure well. Post procedure instructions given.   5) opioid induced constipation: -take senna nightly -discussed goal of daily BM -Provided list of following foods that help with constipation and highlighted a few: 1) prunes- contain high amounts of fiber.  2) apples- has a form of dietary fiber called pectin that accelerates stool movement and increases beneficial gut bacteria 3) pears- in addition to fiber, also high in fructose and sorbitol which have laxative effect 4) figs- contain an enzyme ficin which helps to speed colonic transit 5) kiwis- contain an enzyme actinidin that improves gut motility and reduces constipation 6) oranges- rich in pectin (like apples) 7) grapefruits- contain a flavanol naringenin which has a laxative effect 8) vegetables- rich in fiber and also great sources of folate, vitamin C, and K 9) artichoke- high in inulin, prebiotic great for the microbiome 10) chicory- increases stool frequency and softness (can be added to coffee) 11) rhubarb- laxative effect 12) sweet potato- high fiber 13) beans, peas, and lentils- contain both soluble and insoluble fiber 14) chia seeds- improves intestinal health and gut flora 15) flaxseeds- laxative effect 16) whole grain rye bread- high in fiber 17) oat bran- high in soluble and insoluble fiber 18) kefir- softens stools -recommended to try at least one of these foods every day.  -drink 6-8 glasses of water per day -walk regularly, especially after meals.    8 minutes spent in discussion of her left hip pain, trying 100mg  TID of Gabapentin, discussed potential side effects, discussed that it can also help with her insomnia, discussed starting low to prevent fall and uptitrating if she tolerates it well.

## 2021-09-14 ENCOUNTER — Other Ambulatory Visit: Payer: Self-pay | Admitting: Internal Medicine

## 2021-09-14 DIAGNOSIS — M1612 Unilateral primary osteoarthritis, left hip: Secondary | ICD-10-CM

## 2021-09-14 DIAGNOSIS — G4701 Insomnia due to medical condition: Secondary | ICD-10-CM

## 2021-09-17 ENCOUNTER — Other Ambulatory Visit: Payer: Self-pay

## 2021-09-17 ENCOUNTER — Encounter (HOSPITAL_COMMUNITY): Payer: Self-pay

## 2021-09-17 ENCOUNTER — Ambulatory Visit (HOSPITAL_COMMUNITY)
Admission: RE | Admit: 2021-09-17 | Discharge: 2021-09-17 | Disposition: A | Discharge status: Home / Self Care | Payer: Medicare Other | Source: Ambulatory Visit | Attending: Radiation Oncology | Admitting: Radiation Oncology

## 2021-09-17 ENCOUNTER — Inpatient Hospital Stay: Payer: Medicare Other | Attending: Physician Assistant

## 2021-09-17 DIAGNOSIS — D500 Iron deficiency anemia secondary to blood loss (chronic): Secondary | ICD-10-CM

## 2021-09-17 DIAGNOSIS — I700 Atherosclerosis of aorta: Secondary | ICD-10-CM | POA: Diagnosis not present

## 2021-09-17 DIAGNOSIS — C3431 Malignant neoplasm of lower lobe, right bronchus or lung: Secondary | ICD-10-CM | POA: Insufficient documentation

## 2021-09-17 DIAGNOSIS — J439 Emphysema, unspecified: Secondary | ICD-10-CM | POA: Diagnosis not present

## 2021-09-17 DIAGNOSIS — Z79899 Other long term (current) drug therapy: Secondary | ICD-10-CM | POA: Insufficient documentation

## 2021-09-17 DIAGNOSIS — J90 Pleural effusion, not elsewhere classified: Secondary | ICD-10-CM | POA: Diagnosis not present

## 2021-09-17 DIAGNOSIS — D509 Iron deficiency anemia, unspecified: Secondary | ICD-10-CM | POA: Insufficient documentation

## 2021-09-17 DIAGNOSIS — C3490 Malignant neoplasm of unspecified part of unspecified bronchus or lung: Secondary | ICD-10-CM | POA: Diagnosis present

## 2021-09-17 LAB — CBC WITH DIFFERENTIAL (CANCER CENTER ONLY)
Abs Immature Granulocytes: 0.03 10*3/uL (ref 0.00–0.07)
Basophils Absolute: 0 10*3/uL (ref 0.0–0.1)
Basophils Relative: 0 %
Eosinophils Absolute: 0.1 10*3/uL (ref 0.0–0.5)
Eosinophils Relative: 1 %
HCT: 35.1 % — ABNORMAL LOW (ref 36.0–46.0)
Hemoglobin: 11.5 g/dL — ABNORMAL LOW (ref 12.0–15.0)
Immature Granulocytes: 0 %
Lymphocytes Relative: 14 %
Lymphs Abs: 1.1 10*3/uL (ref 0.7–4.0)
MCH: 30.6 pg (ref 26.0–34.0)
MCHC: 32.8 g/dL (ref 30.0–36.0)
MCV: 93.4 fL (ref 80.0–100.0)
Monocytes Absolute: 0.6 10*3/uL (ref 0.1–1.0)
Monocytes Relative: 7 %
Neutro Abs: 5.9 10*3/uL (ref 1.7–7.7)
Neutrophils Relative %: 78 %
Platelet Count: 445 10*3/uL — ABNORMAL HIGH (ref 150–400)
RBC: 3.76 MIL/uL — ABNORMAL LOW (ref 3.87–5.11)
RDW: 12.5 % (ref 11.5–15.5)
WBC Count: 7.7 10*3/uL (ref 4.0–10.5)
nRBC: 0 % (ref 0.0–0.2)

## 2021-09-17 LAB — FERRITIN: Ferritin: 330 ng/mL — ABNORMAL HIGH (ref 11–307)

## 2021-09-17 LAB — IRON AND TIBC
Iron: 44 ug/dL (ref 41–142)
Saturation Ratios: 17 % — ABNORMAL LOW (ref 21–57)
TIBC: 265 ug/dL (ref 236–444)
UIBC: 221 ug/dL (ref 120–384)

## 2021-09-17 LAB — POCT I-STAT CREATININE: Creatinine, Ser: 0.6 mg/dL (ref 0.44–1.00)

## 2021-09-17 MED ORDER — IOHEXOL 350 MG/ML SOLN
60.0000 mL | Freq: Once | INTRAVENOUS | Status: AC | PRN
  Administered 2021-09-17: 60 mL via INTRAVENOUS

## 2021-09-18 ENCOUNTER — Inpatient Hospital Stay (HOSPITAL_BASED_OUTPATIENT_CLINIC_OR_DEPARTMENT_OTHER): Payer: Medicare Other | Admitting: Internal Medicine

## 2021-09-18 ENCOUNTER — Inpatient Hospital Stay: Payer: Medicare Other

## 2021-09-18 ENCOUNTER — Telehealth: Payer: Medicare Other | Admitting: Internal Medicine

## 2021-09-18 ENCOUNTER — Inpatient Hospital Stay: Payer: Medicare Other | Admitting: Internal Medicine

## 2021-09-18 DIAGNOSIS — C3431 Malignant neoplasm of lower lobe, right bronchus or lung: Secondary | ICD-10-CM | POA: Diagnosis not present

## 2021-09-18 DIAGNOSIS — D500 Iron deficiency anemia secondary to blood loss (chronic): Secondary | ICD-10-CM

## 2021-09-18 NOTE — Progress Notes (Signed)
[place] Telephone:([phone]   Fax:([phone]  PROGRESS NOTE FOR TELEMEDICINE VISITS  [name], MD [place] [address]  I connected withNAME@ on 09/18/21 at  3:30 PM EDT by telephone visit and verified that I am speaking with the correct person using two identifiers.   I discussed the limitations, risks, security and privacy concerns of performing an evaluation and management service by telemedicine and the availability of in-person appointments. I also discussed with the patient that there may be a patient responsible charge related to this service. The patient expressed understanding and agreed to proceed.  Other persons participating in the visit and their role in the encounter: None  Patient's location: Home Provider's location: [place]  DIAGNOSIS:  1) Microcytic anemia secondary to iron deficiency from likely gastrointestinal blood loss. 2) history of stage IA2 (T1b, N0, M0) non-small cell lung cancer diagnosed in December 2019 status post SBRT under the care of Dr. Lisbeth Renshaw.   PRIOR THERAPY: Iron infusion with Venofer 300 mg IV weekly for 3 doses.   CURRENT THERAPY: Observation.  INTERVAL HISTORY: Heather [name] 86 y.o. female has a telephone virtual visit with me today for evaluation and recommendation regarding her condition and the recent CT scan of the chest.  The patient is feeling fine today with no concerning complaints except for the baseline shortness of breath but no significant chest pain, cough or hemoptysis.  She denied having any fever or chills.  She has no nausea, vomiting, diarrhea or constipation.  She has no headache or visual changes.  She denied having any recent weight loss or night sweats.  She had repeat blood work as well as CT scan of the chest performed yesterday and we are having the telephone visit to discuss her scan results.  MEDICAL HISTORY: Past Medical History:  Diagnosis  Date   ANXIETY 07/23/2009   Atherosclerosis of aorta ([place]) 3/71/0626   Complication of anesthesia 7 or 8 yrs ago   woke up during colonscopy   COPD (chronic obstructive pulmonary disease) ([place])    Coronary atherosclerosis 07/24/2015   DEPRESSION 07/23/2009   DIVERTICULOSIS, COLON 07/23/2009   GERD 07/23/2009   Headache(784.0) 07/23/2009   occasional   Hemorrhoids    HIP PAIN, LEFT 05/16/2010   History of Crohn's disease    HYPERLIPIDEMIA 07/23/2009   HYPERTENSION 07/23/2009   HYPOTHYROIDISM 07/23/2009   IBS (irritable bowel syndrome)    Insomnia    LOW BACK PAIN, CHRONIC 10/01/2009   lung ca dx'd 2019   SBRT comp 12/2018   PARESTHESIA 10/01/2009   Shingles 2006   back    ALLERGIES:  is allergic to tylenol [acetaminophen] and symbicort [budesonide-formoterol fumarate].  MEDICATIONS:  Current Outpatient Medications  Medication Sig Dispense Refill   Albuterol Sulfate (PROAIR RESPICLICK) 948 (90 Base) MCG/ACT AEPB Inhale 2 puffs into the lungs 4 (four) times daily as needed. (Patient taking differently: Inhale 2 puffs into the lungs 4 (four) times daily as needed (sob and wheezing).) 1 each 6   Cholecalciferol (VITAMIN D3 PO) Take 1 tablet by mouth daily.     clorazepate (TRANXENE) 7.5 MG tablet TAKE (1) TABLET TWICE DAILY AS NEEDED FOR ANXIETY. 60 tablet 5   docusate sodium (COLACE) 250 MG capsule Take 250 mg by mouth daily.     doxepin (SINEQUAN) 10 MG capsule TAKE 1 CAPSULE AT BEDTIME AS NEEDED. 90 capsule 0   esomeprazole (NEXIUM) 40 MG capsule Take 1 capsule (40 mg  total) by mouth daily. 90 capsule 0   fluticasone furoate-vilanterol (BREO ELLIPTA) 200-25 MCG/INH AEPB Inhale 1 puff into the lungs daily. 357 each 0   folic acid (FOLVITE) 1 MG tablet Take 1 tablet (1 mg total) by mouth daily. 90 tablet 0   furosemide (LASIX) 20 MG tablet TAKE 1 TABLET DAILY IF NEEDED FOR SIGNIFICANT LOWER EXTREMITY SWELLING. 90 tablet 0   gabapentin (NEURONTIN) 100 MG capsule Take 1 capsule (100 mg total)  by mouth 3 (three) times daily. 90 capsule 0   levothyroxine (SYNTHROID) 25 MCG tablet Take 1 tablet (25 mcg total) by mouth daily before breakfast. 90 tablet 0   losartan (COZAAR) 25 MG tablet TAKE (1) TABLET DAILY FOR HIGH BLOOD PRESSURE. 90 tablet 0   Methylnaltrexone Bromide (RELISTOR) 150 MG TABS Take 3 tablets by mouth daily. 270 tablet 1   MYRBETRIQ 25 MG TB24 tablet TAKE 1 TABLET ONCE DAILY. 30 tablet 3   oxybutynin (DITROPAN-XL) 5 MG 24 hr tablet TAKE 1 TABLET IN THE MORNING FOR BLADDER CONTROL. 90 tablet 0   oxyCODONE (OXY IR/ROXICODONE) 5 MG immediate release tablet TAKE ONE TABLET BY MOUTH EVERY 6 HOURS AS NEEDED FOR SEVERE PAIN 90 tablet 0   Plecanatide (TRULANCE) 3 MG TABS Take 1 tablet by mouth daily. 90 tablet 1   potassium chloride SA (KLOR-CON) 20 MEQ tablet Take 1 tablet (20 mEq total) by mouth daily. 90 tablet 0   pregabalin (LYRICA) 25 MG capsule TAKE (1) CAPSULE TWICE DAILY. 60 capsule 3   promethazine (PHENERGAN) 25 MG tablet TAKE 1 TABLET EVERY 8 HOURS AS NEEDED FOR NAUSEA & VOMITING. 30 tablet 1   sertraline (ZOLOFT) 50 MG tablet TAKE 1 TABLET EACH DAY. 90 tablet 0   vitamin B-12 (CYANOCOBALAMIN) 50 MCG tablet Take 50 mcg by mouth daily.     VITAMIN D-1000 MAX ST 25 MCG (1000 UT) tablet Take 1,000 Units by mouth daily.     No current facility-administered medications for this visit.    SURGICAL HISTORY:  Past Surgical History:  Procedure Laterality Date   ABDOMINAL HYSTERECTOMY  age 22 or 3   APPENDECTOMY  yrs ago   BILATERAL SALPINGOOPHORECTOMY  age 78 or 61   COLONOSCOPY N/A 11/17/2013   Procedure: COLONOSCOPY;  Surgeon: Cleotis Nipper, MD;  Location: [facility];  Service: Endoscopy;  Laterality: N/A;   COLONOSCOPY N/A 07/16/2021   ESOPHAGOGASTRODUODENOSCOPY N/A 11/17/2013   Procedure: ESOPHAGOGASTRODUODENOSCOPY (EGD);  Surgeon: Cleotis Nipper, MD;  Location: [facility];  Service: Endoscopy;  Laterality: N/A;   HEMORRHOID SURGERY  yrs ago    TONSILLECTOMY  yrs ago    REVIEW OF SYSTEMS:  A comprehensive review of systems was negative except for: Respiratory: positive for dyspnea on exertion    LABORATORY DATA: Lab Results  Component Value Date   WBC 7.7 09/17/2021   HGB 11.5 (L) 09/17/2021   HCT 35.1 (L) 09/17/2021   MCV 93.4 09/17/2021   PLT 445 (H) 09/17/2021      Chemistry      Component Value Date/Time   NA 138 04/10/2021 1309   K 4.1 04/10/2021 1309   CL 103 04/10/2021 1309   CO2 26 04/10/2021 1309   BUN 9 04/10/2021 1309   CREATININE 0.60 09/17/2021 1408   CREATININE 0.94 04/10/2021 1309      Component Value Date/Time   CALCIUM 9.4 04/10/2021 1309   ALKPHOS 85 04/10/2021 1309   AST 16 04/10/2021 1309   ALT 7 04/10/2021 1309   BILITOT  0.4 04/10/2021 1309       RADIOGRAPHIC STUDIES: No results found.  ASSESSMENT AND PLAN: This is a very pleasant 86 years old white female with history of stage Ia (T1b, N0, M0) non-small cell lung cancer diagnosed in December 2019 status post SBRT under the care of Dr. Lisbeth Renshaw and has been on observation since that time. The patient also has a history of iron deficiency anemia status post iron infusion with Venofer. She is currently on observation and has been doing fine with no concerning complaints.  She had repeat CT scan of the chest performed yesterday.  Unfortunately the final report of the scan is still pending but I personally and independently reviewed the scan images in comparison to the previous scan in July 2022 and I see some further increase in the size of the soft tissue mass in the right hilar area concerning for disease recurrence. I discussed the scan results with the patient today and recommended for her to see Dr. Ander Slade for consideration of repeat bronchoscopy and biopsy of this area.  I sent a secure chat message to Dr. Ander Slade for consideration of this procedure and I will also place a formal consult to him. I will see her back for follow-up visit in 1  months for evaluation and discussion of her treatment options based on the biopsy results. For the iron deficiency anemia she continues to have stable mild anemia with hemoglobin of 11.5 and hematocrit 35.1%.  Her iron studies are within the normal range and ferritin level is elevated.  We will continue to monitor for now. The patient was advised to call immediately if she has any other concerning symptoms in the interval. I discussed the assessment and treatment plan with the patient. The patient was provided an opportunity to ask questions and all were answered. The patient agreed with the plan and demonstrated an understanding of the instructions.   The patient was advised to call back or seek an in-person evaluation if the symptoms worsen or if the condition fails to improve as anticipated.  I provided 29 minutes of non face-to-face telephone visit time during this encounter, and > 50% was spent counseling as documented under my assessment & plan.  Eilleen Kempf, MD 09/18/2021 3:42 PM  Disclaimer: This note was dictated with voice recognition software. Similar sounding words can inadvertently be transcribed and may not be corrected upon review.

## 2021-09-19 ENCOUNTER — Telehealth: Payer: Self-pay | Admitting: Internal Medicine

## 2021-09-19 ENCOUNTER — Encounter: Payer: Self-pay | Admitting: Pulmonary Disease

## 2021-09-19 ENCOUNTER — Ambulatory Visit: Payer: Medicare Other | Admitting: Pulmonary Disease

## 2021-09-19 ENCOUNTER — Other Ambulatory Visit: Payer: Self-pay

## 2021-09-19 ENCOUNTER — Telehealth: Payer: Self-pay | Admitting: Pulmonary Disease

## 2021-09-19 VITALS — BP 126/66 | HR 95 | Temp 98.5°F | Ht 58.5 in | Wt 124.4 lb

## 2021-09-19 DIAGNOSIS — C3431 Malignant neoplasm of lower lobe, right bronchus or lung: Secondary | ICD-10-CM

## 2021-09-19 DIAGNOSIS — R599 Enlarged lymph nodes, unspecified: Secondary | ICD-10-CM | POA: Diagnosis not present

## 2021-09-19 DIAGNOSIS — Z23 Encounter for immunization: Secondary | ICD-10-CM | POA: Diagnosis not present

## 2021-09-19 DIAGNOSIS — R9389 Abnormal findings on diagnostic imaging of other specified body structures: Secondary | ICD-10-CM | POA: Diagnosis not present

## 2021-09-19 NOTE — H&P (View-Only) (Signed)
Synopsis: Referred in Oct 2022 for hilar node by [name], MD  Subjective:   PATIENT ID: Heather [name] GENDER: female DOB: 1935-12-04, MRN: [id number]  Chief Complaint  Patient presents with   New Patient (Initial Visit)    Evaluation for lung nodule.    PMH lung mass, empiric radiation, HLD, HTN. Former smoker. CT chest recently that was completed.  Patient was given empiric radiation for this mass on the right side.  Had subsequent CT scan follow-up.  This showed enlargement of the node in the right hilum concerning for recurrence of disease.  Patient was referred to me for consideration of videobronchoscope with endobronchial ultrasound.  We discussed this today in detail with patient and patient's son.  She has received anesthesia before for colonoscopy and did fine.  She is not on any blood thinners or antiplatelets.  She is okay with consideration for biopsy.   Past Medical History:  Diagnosis Date   ANXIETY 07/23/2009   Atherosclerosis of aorta ([place]) 03/16/8118   Complication of anesthesia 7 or 8 yrs ago   woke up during colonscopy   COPD (chronic obstructive pulmonary disease) ([place])    Coronary atherosclerosis 07/24/2015   DEPRESSION 07/23/2009   DIVERTICULOSIS, COLON 07/23/2009   GERD 07/23/2009   Headache(784.0) 07/23/2009   occasional   Hemorrhoids    HIP PAIN, LEFT 05/16/2010   History of Crohn's disease    HYPERLIPIDEMIA 07/23/2009   HYPERTENSION 07/23/2009   HYPOTHYROIDISM 07/23/2009   IBS (irritable bowel syndrome)    Insomnia    LOW BACK PAIN, CHRONIC 10/01/2009   lung ca dx'd 2019   SBRT comp 12/2018   PARESTHESIA 10/01/2009   Shingles 2006   back     Family History  Problem Relation Age of Onset   Cancer Mother        Unknown type   Cancer Sister        Breast   Clotting disorder Neg Hx      Past Surgical History:  Procedure Laterality Date   ABDOMINAL HYSTERECTOMY  age 81 or 37   APPENDECTOMY  yrs ago   BILATERAL SALPINGOOPHORECTOMY  age  78 or 36   COLONOSCOPY N/A 11/17/2013   Procedure: COLONOSCOPY;  Surgeon: Cleotis Nipper, MD;  Location: [facility];  Service: Endoscopy;  Laterality: N/A;   COLONOSCOPY N/A 07/16/2021   ESOPHAGOGASTRODUODENOSCOPY N/A 11/17/2013   Procedure: ESOPHAGOGASTRODUODENOSCOPY (EGD);  Surgeon: Cleotis Nipper, MD;  Location: [facility];  Service: Endoscopy;  Laterality: N/A;   HEMORRHOID SURGERY  yrs ago   TONSILLECTOMY  yrs ago    Social History   Socioeconomic History   Marital status: Widowed    Spouse name: Not on file   Number of children: 1   Years of education: Not on file   Highest education level: Not on file  Occupational History   Not on file  Tobacco Use   Smoking status: Former    Packs/day: 0.50    Types: Cigarettes    Start date: 07/16/1960    Quit date: 07/16/1998    Years since quitting: 23.1   Smokeless tobacco: Never  Vaping Use   Vaping Use: Never used  Substance and Sexual Activity   Alcohol use: No    Alcohol/week: 0.0 standard drinks   Drug use: No   Sexual activity: Never  Other Topics Concern   Not on file  Social History Narrative   Widowed.  Lives alone in her own home.  Ambulates with a  cane/walker when needed.   Social Determinants of Health   Financial Resource Strain: Not on file  Food Insecurity: Not on file  Transportation Needs: Not on file  Physical Activity: Not on file  Stress: Not on file  Social Connections: Not on file  Intimate Partner Violence: Not on file     Allergies  Allergen Reactions   Tylenol [Acetaminophen] Other (See Comments)    Nightmares   Symbicort [Budesonide-Formoterol Fumarate] Other (See Comments)    Pt felt like her tongue was swollen, could not swallow     Outpatient Medications Prior to Visit  Medication Sig Dispense Refill   Albuterol Sulfate (PROAIR RESPICLICK) 433 (90 Base) MCG/ACT AEPB Inhale 2 puffs into the lungs 4 (four) times daily as needed. (Patient taking differently: Inhale 2 puffs into  the lungs 4 (four) times daily as needed (sob and wheezing).) 1 each 6   Cholecalciferol (VITAMIN D3 PO) Take 1 tablet by mouth daily.     clorazepate (TRANXENE) 7.5 MG tablet TAKE (1) TABLET TWICE DAILY AS NEEDED FOR ANXIETY. 60 tablet 5   docusate sodium (COLACE) 250 MG capsule Take 250 mg by mouth daily.     doxepin (SINEQUAN) 10 MG capsule TAKE 1 CAPSULE AT BEDTIME AS NEEDED. 90 capsule 0   esomeprazole (NEXIUM) 40 MG capsule Take 1 capsule (40 mg total) by mouth daily. 90 capsule 0   fluticasone furoate-vilanterol (BREO ELLIPTA) 200-25 MCG/INH AEPB Inhale 1 puff into the lungs daily. 295 each 0   folic acid (FOLVITE) 1 MG tablet Take 1 tablet (1 mg total) by mouth daily. 90 tablet 0   furosemide (LASIX) 20 MG tablet TAKE 1 TABLET DAILY IF NEEDED FOR SIGNIFICANT LOWER EXTREMITY SWELLING. 90 tablet 0   gabapentin (NEURONTIN) 100 MG capsule Take 1 capsule (100 mg total) by mouth 3 (three) times daily. 90 capsule 0   levothyroxine (SYNTHROID) 25 MCG tablet Take 1 tablet (25 mcg total) by mouth daily before breakfast. 90 tablet 0   losartan (COZAAR) 25 MG tablet TAKE (1) TABLET DAILY FOR HIGH BLOOD PRESSURE. 90 tablet 0   Methylnaltrexone Bromide (RELISTOR) 150 MG TABS Take 3 tablets by mouth daily. 270 tablet 1   MYRBETRIQ 25 MG TB24 tablet TAKE 1 TABLET ONCE DAILY. 30 tablet 3   oxybutynin (DITROPAN-XL) 5 MG 24 hr tablet TAKE 1 TABLET IN THE MORNING FOR BLADDER CONTROL. 90 tablet 0   oxyCODONE (OXY IR/ROXICODONE) 5 MG immediate release tablet TAKE ONE TABLET BY MOUTH EVERY 6 HOURS AS NEEDED FOR SEVERE PAIN 90 tablet 0   Plecanatide (TRULANCE) 3 MG TABS Take 1 tablet by mouth daily. 90 tablet 1   potassium chloride SA (KLOR-CON) 20 MEQ tablet Take 1 tablet (20 mEq total) by mouth daily. 90 tablet 0   pregabalin (LYRICA) 25 MG capsule TAKE (1) CAPSULE TWICE DAILY. 60 capsule 3   promethazine (PHENERGAN) 25 MG tablet TAKE 1 TABLET EVERY 8 HOURS AS NEEDED FOR NAUSEA & VOMITING. 30 tablet 1    sertraline (ZOLOFT) 50 MG tablet TAKE 1 TABLET EACH DAY. 90 tablet 0   vitamin B-12 (CYANOCOBALAMIN) 50 MCG tablet Take 50 mcg by mouth daily.     VITAMIN D-1000 MAX ST 25 MCG (1000 UT) tablet Take 1,000 Units by mouth daily.     No facility-administered medications prior to visit.    ROS   Objective:  Physical Exam   Vitals:   09/19/21 1459  BP: 126/66  Pulse: 95  Temp: 98.5 F (36.9 C)  TempSrc: Oral  SpO2: 90%  Weight: 124 lb 6.4 oz (56.4 kg)  Height: 4' 10.5" (1.486 m)   90% on RA BMI Readings from Last 3 Encounters:  09/19/21 25.56 kg/m  08/02/21 24.24 kg/m  07/16/21 24.24 kg/m   Wt Readings from Last 3 Encounters:  09/19/21 124 lb 6.4 oz (56.4 kg)  08/02/21 120 lb (54.4 kg)  07/16/21 120 lb (54.4 kg)     CBC    Component Value Date/Time   WBC 7.7 09/17/2021 1259   WBC 7.0 03/25/2021 1439   RBC 3.76 (L) 09/17/2021 1259   HGB 11.5 (L) 09/17/2021 1259   HCT 35.1 (L) 09/17/2021 1259   PLT 445 (H) 09/17/2021 1259   MCV 93.4 09/17/2021 1259   MCH 30.6 09/17/2021 1259   MCHC 32.8 09/17/2021 1259   RDW 12.5 09/17/2021 1259   LYMPHSABS 1.1 09/17/2021 1259   MONOABS 0.6 09/17/2021 1259   EOSABS 0.1 09/17/2021 1259   BASOSABS 0.0 09/17/2021 1259     Chest Imaging: October 2022: CT scan of the chest with hilar adenopathy and possible hilar mass on the right side some evidence of radiation changes. The patient's images have been independently reviewed by me.    Pulmonary Functions Testing Results: PFT Results Latest Ref Rng & Units 08/13/2018  FVC-Pre L 1.90  FVC-Predicted Pre % 101  FVC-Post L 2.02  FVC-Predicted Post % 108  Pre FEV1/FVC % % 57  Post FEV1/FCV % % 61  FEV1-Pre L 1.09  FEV1-Predicted Pre % 79  FEV1-Post L 1.24  DLCO uncorrected ml/min/mmHg 8.62  DLCO UNC% % 53  DLVA Predicted % 60    FeNO:  Pathology:   Echocardiogram:   Heart Catheterization:     Assessment & Plan:     ICD-10-CM   1. Adenopathy  R59.9 Procedural/  Surgical Case Request: VIDEO BRONCHOSCOPY WITH ENDOBRONCHIAL ULTRASOUND    Ambulatory referral to Pulmonology    2. Needs flu shot  Z23     3. Malignant neoplasm of lower lobe of right lung (HCC)  C34.31     4. Abnormal CT of the chest  R93.89       Discussion: This is an 86 year old female, lung mass on the right side treated with empiric radiation.  No previous biopsy.  Unfortunately has CT imaging that is concerning for recurrence of disease with hilar adenopathy.  She is in a better physical state it seems then when she first presented.  From a respiratory standpoint she is not on oxygen.  She takes no blood thinners or antiplatelets.  She has has had sedation with colonoscopy and done well.  Plan: Today in the office we discussed the risk benefits and alternatives of undergoing undergoing video bronchoscopy with endobronchial ultrasound transbronchial needle aspirations. Patient is agreeable to proceed with this. Patient's son was today in the office present for the discussion We discussed the risk of bleeding and pneumothorax. We will tentatively plan for 10/03/2021 We appreciate the referral for procedure evaluation.   Current Outpatient Medications:    Albuterol Sulfate (PROAIR RESPICLICK) 500 (90 Base) MCG/ACT AEPB, Inhale 2 puffs into the lungs 4 (four) times daily as needed. (Patient taking differently: Inhale 2 puffs into the lungs 4 (four) times daily as needed (sob and wheezing).), Disp: 1 each, Rfl: 6   Cholecalciferol (VITAMIN D3 PO), Take 1 tablet by mouth daily., Disp: , Rfl:    clorazepate (TRANXENE) 7.5 MG tablet, TAKE (1) TABLET TWICE DAILY AS NEEDED FOR ANXIETY., Disp: 60 tablet,  Rfl: 5   docusate sodium (COLACE) 250 MG capsule, Take 250 mg by mouth daily., Disp: , Rfl:    doxepin (SINEQUAN) 10 MG capsule, TAKE 1 CAPSULE AT BEDTIME AS NEEDED., Disp: 90 capsule, Rfl: 0   esomeprazole (NEXIUM) 40 MG capsule, Take 1 capsule (40 mg total) by mouth daily., Disp: 90  capsule, Rfl: 0   fluticasone furoate-vilanterol (BREO ELLIPTA) 200-25 MCG/INH AEPB, Inhale 1 puff into the lungs daily., Disp: 120 each, Rfl: 0   folic acid (FOLVITE) 1 MG tablet, Take 1 tablet (1 mg total) by mouth daily., Disp: 90 tablet, Rfl: 0   furosemide (LASIX) 20 MG tablet, TAKE 1 TABLET DAILY IF NEEDED FOR SIGNIFICANT LOWER EXTREMITY SWELLING., Disp: 90 tablet, Rfl: 0   gabapentin (NEURONTIN) 100 MG capsule, Take 1 capsule (100 mg total) by mouth 3 (three) times daily., Disp: 90 capsule, Rfl: 0   levothyroxine (SYNTHROID) 25 MCG tablet, Take 1 tablet (25 mcg total) by mouth daily before breakfast., Disp: 90 tablet, Rfl: 0   losartan (COZAAR) 25 MG tablet, TAKE (1) TABLET DAILY FOR HIGH BLOOD PRESSURE., Disp: 90 tablet, Rfl: 0   Methylnaltrexone Bromide (RELISTOR) 150 MG TABS, Take 3 tablets by mouth daily., Disp: 270 tablet, Rfl: 1   MYRBETRIQ 25 MG TB24 tablet, TAKE 1 TABLET ONCE DAILY., Disp: 30 tablet, Rfl: 3   oxybutynin (DITROPAN-XL) 5 MG 24 hr tablet, TAKE 1 TABLET IN THE MORNING FOR BLADDER CONTROL., Disp: 90 tablet, Rfl: 0   oxyCODONE (OXY IR/ROXICODONE) 5 MG immediate release tablet, TAKE ONE TABLET BY MOUTH EVERY 6 HOURS AS NEEDED FOR SEVERE PAIN, Disp: 90 tablet, Rfl: 0   Plecanatide (TRULANCE) 3 MG TABS, Take 1 tablet by mouth daily., Disp: 90 tablet, Rfl: 1   potassium chloride SA (KLOR-CON) 20 MEQ tablet, Take 1 tablet (20 mEq total) by mouth daily., Disp: 90 tablet, Rfl: 0   pregabalin (LYRICA) 25 MG capsule, TAKE (1) CAPSULE TWICE DAILY., Disp: 60 capsule, Rfl: 3   promethazine (PHENERGAN) 25 MG tablet, TAKE 1 TABLET EVERY 8 HOURS AS NEEDED FOR NAUSEA & VOMITING., Disp: 30 tablet, Rfl: 1   sertraline (ZOLOFT) 50 MG tablet, TAKE 1 TABLET EACH DAY., Disp: 90 tablet, Rfl: 0   vitamin B-12 (CYANOCOBALAMIN) 50 MCG tablet, Take 50 mcg by mouth daily., Disp: , Rfl:    VITAMIN D-1000 MAX ST 25 MCG (1000 UT) tablet, Take 1,000 Units by mouth daily., Disp: , Rfl:    Garner Nash, DO [facility] 09/19/2021 3:54 PM

## 2021-09-19 NOTE — Progress Notes (Signed)
Synopsis: Referred in Oct 2022 for hilar node by [name], MD  Subjective:   PATIENT ID: Heather [name] GENDER: female DOB: Jul 05, 1935, MRN: [id number]  Chief Complaint  Patient presents with   New Patient (Initial Visit)    Evaluation for lung nodule.    PMH lung mass, empiric radiation, HLD, HTN. Former smoker. CT chest recently that was completed.  Patient was given empiric radiation for this mass on the right side.  Had subsequent CT scan follow-up.  This showed enlargement of the node in the right hilum concerning for recurrence of disease.  Patient was referred to me for consideration of videobronchoscope with endobronchial ultrasound.  We discussed this today in detail with patient and patient's son.  She has received anesthesia before for colonoscopy and did fine.  She is not on any blood thinners or antiplatelets.  She is okay with consideration for biopsy.   Past Medical History:  Diagnosis Date   ANXIETY 07/23/2009   Atherosclerosis of aorta ([place]) 3/47/4259   Complication of anesthesia 7 or 8 yrs ago   woke up during colonscopy   COPD (chronic obstructive pulmonary disease) ([place])    Coronary atherosclerosis 07/24/2015   DEPRESSION 07/23/2009   DIVERTICULOSIS, COLON 07/23/2009   GERD 07/23/2009   Headache(784.0) 07/23/2009   occasional   Hemorrhoids    HIP PAIN, LEFT 05/16/2010   History of Crohn's disease    HYPERLIPIDEMIA 07/23/2009   HYPERTENSION 07/23/2009   HYPOTHYROIDISM 07/23/2009   IBS (irritable bowel syndrome)    Insomnia    LOW BACK PAIN, CHRONIC 10/01/2009   lung ca dx'd 2019   SBRT comp 12/2018   PARESTHESIA 10/01/2009   Shingles 2006   back     Family History  Problem Relation Age of Onset   Cancer Mother        Unknown type   Cancer Sister        Breast   Clotting disorder Neg Hx      Past Surgical History:  Procedure Laterality Date   ABDOMINAL HYSTERECTOMY  age 59 or 30   APPENDECTOMY  yrs ago   BILATERAL SALPINGOOPHORECTOMY  age  74 or 45   COLONOSCOPY N/A 11/17/2013   Procedure: COLONOSCOPY;  Surgeon: Cleotis Nipper, MD;  Location: [facility];  Service: Endoscopy;  Laterality: N/A;   COLONOSCOPY N/A 07/16/2021   ESOPHAGOGASTRODUODENOSCOPY N/A 11/17/2013   Procedure: ESOPHAGOGASTRODUODENOSCOPY (EGD);  Surgeon: Cleotis Nipper, MD;  Location: [facility];  Service: Endoscopy;  Laterality: N/A;   HEMORRHOID SURGERY  yrs ago   TONSILLECTOMY  yrs ago    Social History   Socioeconomic History   Marital status: Widowed    Spouse name: Not on file   Number of children: 1   Years of education: Not on file   Highest education level: Not on file  Occupational History   Not on file  Tobacco Use   Smoking status: Former    Packs/day: 0.50    Types: Cigarettes    Start date: 07/16/1960    Quit date: 07/16/1998    Years since quitting: 23.1   Smokeless tobacco: Never  Vaping Use   Vaping Use: Never used  Substance and Sexual Activity   Alcohol use: No    Alcohol/week: 0.0 standard drinks   Drug use: No   Sexual activity: Never  Other Topics Concern   Not on file  Social History Narrative   Widowed.  Lives alone in her own home.  Ambulates with a  cane/walker when needed.   Social Determinants of Health   Financial Resource Strain: Not on file  Food Insecurity: Not on file  Transportation Needs: Not on file  Physical Activity: Not on file  Stress: Not on file  Social Connections: Not on file  Intimate Partner Violence: Not on file     Allergies  Allergen Reactions   Tylenol [Acetaminophen] Other (See Comments)    Nightmares   Symbicort [Budesonide-Formoterol Fumarate] Other (See Comments)    Pt felt like her tongue was swollen, could not swallow     Outpatient Medications Prior to Visit  Medication Sig Dispense Refill   Albuterol Sulfate (PROAIR RESPICLICK) 182 (90 Base) MCG/ACT AEPB Inhale 2 puffs into the lungs 4 (four) times daily as needed. (Patient taking differently: Inhale 2 puffs into  the lungs 4 (four) times daily as needed (sob and wheezing).) 1 each 6   Cholecalciferol (VITAMIN D3 PO) Take 1 tablet by mouth daily.     clorazepate (TRANXENE) 7.5 MG tablet TAKE (1) TABLET TWICE DAILY AS NEEDED FOR ANXIETY. 60 tablet 5   docusate sodium (COLACE) 250 MG capsule Take 250 mg by mouth daily.     doxepin (SINEQUAN) 10 MG capsule TAKE 1 CAPSULE AT BEDTIME AS NEEDED. 90 capsule 0   esomeprazole (NEXIUM) 40 MG capsule Take 1 capsule (40 mg total) by mouth daily. 90 capsule 0   fluticasone furoate-vilanterol (BREO ELLIPTA) 200-25 MCG/INH AEPB Inhale 1 puff into the lungs daily. 993 each 0   folic acid (FOLVITE) 1 MG tablet Take 1 tablet (1 mg total) by mouth daily. 90 tablet 0   furosemide (LASIX) 20 MG tablet TAKE 1 TABLET DAILY IF NEEDED FOR SIGNIFICANT LOWER EXTREMITY SWELLING. 90 tablet 0   gabapentin (NEURONTIN) 100 MG capsule Take 1 capsule (100 mg total) by mouth 3 (three) times daily. 90 capsule 0   levothyroxine (SYNTHROID) 25 MCG tablet Take 1 tablet (25 mcg total) by mouth daily before breakfast. 90 tablet 0   losartan (COZAAR) 25 MG tablet TAKE (1) TABLET DAILY FOR HIGH BLOOD PRESSURE. 90 tablet 0   Methylnaltrexone Bromide (RELISTOR) 150 MG TABS Take 3 tablets by mouth daily. 270 tablet 1   MYRBETRIQ 25 MG TB24 tablet TAKE 1 TABLET ONCE DAILY. 30 tablet 3   oxybutynin (DITROPAN-XL) 5 MG 24 hr tablet TAKE 1 TABLET IN THE MORNING FOR BLADDER CONTROL. 90 tablet 0   oxyCODONE (OXY IR/ROXICODONE) 5 MG immediate release tablet TAKE ONE TABLET BY MOUTH EVERY 6 HOURS AS NEEDED FOR SEVERE PAIN 90 tablet 0   Plecanatide (TRULANCE) 3 MG TABS Take 1 tablet by mouth daily. 90 tablet 1   potassium chloride SA (KLOR-CON) 20 MEQ tablet Take 1 tablet (20 mEq total) by mouth daily. 90 tablet 0   pregabalin (LYRICA) 25 MG capsule TAKE (1) CAPSULE TWICE DAILY. 60 capsule 3   promethazine (PHENERGAN) 25 MG tablet TAKE 1 TABLET EVERY 8 HOURS AS NEEDED FOR NAUSEA & VOMITING. 30 tablet 1    sertraline (ZOLOFT) 50 MG tablet TAKE 1 TABLET EACH DAY. 90 tablet 0   vitamin B-12 (CYANOCOBALAMIN) 50 MCG tablet Take 50 mcg by mouth daily.     VITAMIN D-1000 MAX ST 25 MCG (1000 UT) tablet Take 1,000 Units by mouth daily.     No facility-administered medications prior to visit.    ROS   Objective:  Physical Exam   Vitals:   09/19/21 1459  BP: 126/66  Pulse: 95  Temp: 98.5 F (36.9 C)  TempSrc: Oral  SpO2: 90%  Weight: 124 lb 6.4 oz (56.4 kg)  Height: 4' 10.5" (1.486 m)   90% on RA BMI Readings from Last 3 Encounters:  09/19/21 25.56 kg/m  08/02/21 24.24 kg/m  07/16/21 24.24 kg/m   Wt Readings from Last 3 Encounters:  09/19/21 124 lb 6.4 oz (56.4 kg)  08/02/21 120 lb (54.4 kg)  07/16/21 120 lb (54.4 kg)     CBC    Component Value Date/Time   WBC 7.7 09/17/2021 1259   WBC 7.0 03/25/2021 1439   RBC 3.76 (L) 09/17/2021 1259   HGB 11.5 (L) 09/17/2021 1259   HCT 35.1 (L) 09/17/2021 1259   PLT 445 (H) 09/17/2021 1259   MCV 93.4 09/17/2021 1259   MCH 30.6 09/17/2021 1259   MCHC 32.8 09/17/2021 1259   RDW 12.5 09/17/2021 1259   LYMPHSABS 1.1 09/17/2021 1259   MONOABS 0.6 09/17/2021 1259   EOSABS 0.1 09/17/2021 1259   BASOSABS 0.0 09/17/2021 1259     Chest Imaging: October 2022: CT scan of the chest with hilar adenopathy and possible hilar mass on the right side some evidence of radiation changes. The patient's images have been independently reviewed by me.    Pulmonary Functions Testing Results: PFT Results Latest Ref Rng & Units 08/13/2018  FVC-Pre L 1.90  FVC-Predicted Pre % 101  FVC-Post L 2.02  FVC-Predicted Post % 108  Pre FEV1/FVC % % 57  Post FEV1/FCV % % 61  FEV1-Pre L 1.09  FEV1-Predicted Pre % 79  FEV1-Post L 1.24  DLCO uncorrected ml/min/mmHg 8.62  DLCO UNC% % 53  DLVA Predicted % 60    FeNO:  Pathology:   Echocardiogram:   Heart Catheterization:     Assessment & Plan:     ICD-10-CM   1. Adenopathy  R59.9 Procedural/  Surgical Case Request: VIDEO BRONCHOSCOPY WITH ENDOBRONCHIAL ULTRASOUND    Ambulatory referral to Pulmonology    2. Needs flu shot  Z23     3. Malignant neoplasm of lower lobe of right lung (HCC)  C34.31     4. Abnormal CT of the chest  R93.89       Discussion: This is an 86 year old female, lung mass on the right side treated with empiric radiation.  No previous biopsy.  Unfortunately has CT imaging that is concerning for recurrence of disease with hilar adenopathy.  She is in a better physical state it seems then when she first presented.  From a respiratory standpoint she is not on oxygen.  She takes no blood thinners or antiplatelets.  She has has had sedation with colonoscopy and done well.  Plan: Today in the office we discussed the risk benefits and alternatives of undergoing undergoing video bronchoscopy with endobronchial ultrasound transbronchial needle aspirations. Patient is agreeable to proceed with this. Patient's son was today in the office present for the discussion We discussed the risk of bleeding and pneumothorax. We will tentatively plan for 10/03/2021 We appreciate the referral for procedure evaluation.   Current Outpatient Medications:    Albuterol Sulfate (PROAIR RESPICLICK) 161 (90 Base) MCG/ACT AEPB, Inhale 2 puffs into the lungs 4 (four) times daily as needed. (Patient taking differently: Inhale 2 puffs into the lungs 4 (four) times daily as needed (sob and wheezing).), Disp: 1 each, Rfl: 6   Cholecalciferol (VITAMIN D3 PO), Take 1 tablet by mouth daily., Disp: , Rfl:    clorazepate (TRANXENE) 7.5 MG tablet, TAKE (1) TABLET TWICE DAILY AS NEEDED FOR ANXIETY., Disp: 60 tablet,  Rfl: 5   docusate sodium (COLACE) 250 MG capsule, Take 250 mg by mouth daily., Disp: , Rfl:    doxepin (SINEQUAN) 10 MG capsule, TAKE 1 CAPSULE AT BEDTIME AS NEEDED., Disp: 90 capsule, Rfl: 0   esomeprazole (NEXIUM) 40 MG capsule, Take 1 capsule (40 mg total) by mouth daily., Disp: 90  capsule, Rfl: 0   fluticasone furoate-vilanterol (BREO ELLIPTA) 200-25 MCG/INH AEPB, Inhale 1 puff into the lungs daily., Disp: 120 each, Rfl: 0   folic acid (FOLVITE) 1 MG tablet, Take 1 tablet (1 mg total) by mouth daily., Disp: 90 tablet, Rfl: 0   furosemide (LASIX) 20 MG tablet, TAKE 1 TABLET DAILY IF NEEDED FOR SIGNIFICANT LOWER EXTREMITY SWELLING., Disp: 90 tablet, Rfl: 0   gabapentin (NEURONTIN) 100 MG capsule, Take 1 capsule (100 mg total) by mouth 3 (three) times daily., Disp: 90 capsule, Rfl: 0   levothyroxine (SYNTHROID) 25 MCG tablet, Take 1 tablet (25 mcg total) by mouth daily before breakfast., Disp: 90 tablet, Rfl: 0   losartan (COZAAR) 25 MG tablet, TAKE (1) TABLET DAILY FOR HIGH BLOOD PRESSURE., Disp: 90 tablet, Rfl: 0   Methylnaltrexone Bromide (RELISTOR) 150 MG TABS, Take 3 tablets by mouth daily., Disp: 270 tablet, Rfl: 1   MYRBETRIQ 25 MG TB24 tablet, TAKE 1 TABLET ONCE DAILY., Disp: 30 tablet, Rfl: 3   oxybutynin (DITROPAN-XL) 5 MG 24 hr tablet, TAKE 1 TABLET IN THE MORNING FOR BLADDER CONTROL., Disp: 90 tablet, Rfl: 0   oxyCODONE (OXY IR/ROXICODONE) 5 MG immediate release tablet, TAKE ONE TABLET BY MOUTH EVERY 6 HOURS AS NEEDED FOR SEVERE PAIN, Disp: 90 tablet, Rfl: 0   Plecanatide (TRULANCE) 3 MG TABS, Take 1 tablet by mouth daily., Disp: 90 tablet, Rfl: 1   potassium chloride SA (KLOR-CON) 20 MEQ tablet, Take 1 tablet (20 mEq total) by mouth daily., Disp: 90 tablet, Rfl: 0   pregabalin (LYRICA) 25 MG capsule, TAKE (1) CAPSULE TWICE DAILY., Disp: 60 capsule, Rfl: 3   promethazine (PHENERGAN) 25 MG tablet, TAKE 1 TABLET EVERY 8 HOURS AS NEEDED FOR NAUSEA & VOMITING., Disp: 30 tablet, Rfl: 1   sertraline (ZOLOFT) 50 MG tablet, TAKE 1 TABLET EACH DAY., Disp: 90 tablet, Rfl: 0   vitamin B-12 (CYANOCOBALAMIN) 50 MCG tablet, Take 50 mcg by mouth daily., Disp: , Rfl:    VITAMIN D-1000 MAX ST 25 MCG (1000 UT) tablet, Take 1,000 Units by mouth daily., Disp: , Rfl:    Garner Nash, DO [facility] 09/19/2021 3:54 PM

## 2021-09-19 NOTE — Telephone Encounter (Signed)
Scheduled follow-up appointment per 10/12 los. Patient's son is aware.

## 2021-09-19 NOTE — Telephone Encounter (Signed)
Spoke with the pt's son and made appt with BI for 3 pm today since he had opening.

## 2021-09-19 NOTE — Patient Instructions (Signed)
Thank you for visiting Dr. Valeta Harms at [facility]. Today we recommend the following:  Orders Placed This Encounter  Procedures   Procedural/ Surgical Case Request: [place]   Ambulatory referral to Pulmonology   Bronchoscopy on 10/03/2021  Return in about 3 weeks (around 10/10/2021) for with APP, to review pathology results . Eric Form, NP is available.     Please do your part to reduce the spread of COVID-19.

## 2021-09-19 NOTE — Telephone Encounter (Signed)
Please schedule patient to see Dr Valeta Harms ASAP  I spoke with Dr Valeta Harms and he is okay to have the patient double booked so she can be seen ASAP to be evaluated- schedule either 9am or 130 pm.  I did discuss findings on CT with patient's son [name] who is the patient's contact person, he will be accompanying patient to the visit.  Goal is to have patient evaluated by Dr. Valeta Harms and possibly an EBUS to be scheduled.

## 2021-09-20 ENCOUNTER — Telehealth: Payer: Self-pay | Admitting: *Deleted

## 2021-09-20 ENCOUNTER — Telehealth: Payer: Self-pay | Admitting: Pulmonary Disease

## 2021-09-20 DIAGNOSIS — R599 Enlarged lymph nodes, unspecified: Secondary | ICD-10-CM | POA: Diagnosis present

## 2021-09-20 NOTE — Telephone Encounter (Signed)
CALLED PATIENT TO INFORM THAT Heather [name] WOULD NOT CALL HER Monday October 17 WITH TEST  RESULTS DUE TO DR. ICARD ALREADY CALLING HER WITH RESULTS, SPOKE WITH PATIENT AND SHE VERIFIED UNDERSTANDING THIS

## 2021-09-20 NOTE — Telephone Encounter (Signed)
I scheduled for 10/27 at 7:30 at [facility].  Pt to go for covid test on 10/24.  Gave appt info to pt.

## 2021-09-23 ENCOUNTER — Ambulatory Visit: Payer: Medicare Other | Admitting: Radiation Oncology

## 2021-09-30 ENCOUNTER — Other Ambulatory Visit: Payer: Self-pay | Admitting: Pulmonary Disease

## 2021-10-01 ENCOUNTER — Encounter (HOSPITAL_COMMUNITY): Payer: Self-pay | Admitting: Pulmonary Disease

## 2021-10-01 ENCOUNTER — Other Ambulatory Visit: Payer: Self-pay

## 2021-10-01 NOTE — Progress Notes (Signed)
Spoke with pt's son, [name] for pre-op call. DPR on file. He states pt has never had a MI and never had stents in her heart. Pt is treated for HTN, but is not diabetic.   Covid test done 09/30/21, no results in Epic today.

## 2021-10-02 ENCOUNTER — Ambulatory Visit (INDEPENDENT_AMBULATORY_CARE_PROVIDER_SITE_OTHER): Payer: Medicare Other | Admitting: Pulmonary Disease

## 2021-10-02 DIAGNOSIS — U071 COVID-19: Secondary | ICD-10-CM

## 2021-10-02 DIAGNOSIS — C3431 Malignant neoplasm of lower lobe, right bronchus or lung: Secondary | ICD-10-CM

## 2021-10-02 NOTE — Telephone Encounter (Signed)
Pt has tested positive for covid.  Dr Valeta Harms has requested pt be moved to 11/10 at 7:30 and he will make her aware of change in televisit today.  [place] has rescheduled the pt.

## 2021-10-02 NOTE — Progress Notes (Signed)
Dr Valeta Harms notified of positive result, case to be rescheduled

## 2021-10-02 NOTE — Progress Notes (Signed)
Virtual Visit via Telephone Note  I connected with Heather [name] on 10/02/21 at  3:00 PM EDT by telephone and verified that I am speaking with the correct person using two identifiers.  Location: Patient: At home  Provider: Office   I discussed the limitations, risks, security and privacy concerns of performing an evaluation and management service by telephone and the availability of in person appointments. I also discussed with the patient that there may be a patient responsible charge related to this service. The patient expressed understanding and agreed to proceed.   History of Present Illness:  PMH lung mass, empiric radiation, HLD, HTN. Former smoker. CT chest recently that was completed.  Patient was given empiric radiation for this mass on the right side.  Had subsequent CT scan follow-up.  This showed enlargement of the node in the right hilum concerning for recurrence of disease.  Patient was referred to me for consideration of videobronchoscope with endobronchial ultrasound.  We discussed this today in detail with patient and patient's son.  She has received anesthesia before for colonoscopy and did fine.  She is not on any blood thinners or antiplatelets.  She is okay with consideration for biopsy.  OV 10/02/2021 telephone visit: Called spoke with patient recently diagnosed with COVID-19.  Her preop swab was positive.  We will have to reschedule her planned bronchoscopy.  After talking to her and her son she actually developed URI symptoms about a week and a half ago was seen by primary care and treated for influenza-like illness.  I presume this was her COVID-19 presentation.  She still feels tired and overall feeling better than before.   Observations/Objective: Able to talk in complete sentences no distress.  Assessment and Plan:  This is an 86 year old female, lung mass on the right treated with empiric radiation now with hilar adenopathy concerning for recurrence of  disease.  Was planned for video bronchoscopy with endobronchial ultrasound but preoperative test was positive for COVID-19.  Last week she did have URI type symptoms which is suspect with her presentation of COVID-19.  Plan: We will reschedule her bronchoscopy. She is slowly recovering still feels fatigued from what appears to be short course of COVID-19 infection. No indication at this time for treatments. Rescheduled bronchoscopy will be on 10/17/2021.  Follow Up Instructions:  I discussed the assessment and treatment plan with the patient. The patient was provided an opportunity to ask questions and all were answered. The patient agreed with the plan and demonstrated an understanding of the instructions.   The patient was advised to call back or seek an in-person evaluation if the symptoms worsen or if the condition fails to improve as anticipated.  I provided 12 minutes of non-face-to-face time during this encounter.  Garner Nash, DO

## 2021-10-03 DIAGNOSIS — R599 Enlarged lymph nodes, unspecified: Secondary | ICD-10-CM | POA: Insufficient documentation

## 2021-10-04 ENCOUNTER — Telehealth: Payer: Self-pay | Admitting: *Deleted

## 2021-10-04 ENCOUNTER — Telehealth: Payer: Self-pay | Admitting: Pulmonary Disease

## 2021-10-04 MED ORDER — MOLNUPIRAVIR EUA 200MG CAPSULE: 4.0000 | ORAL_CAPSULE | Freq: Two times a day (BID) | ORAL | 0 refills | Status: AC

## 2021-10-04 NOTE — Telephone Encounter (Signed)
Called and spoke with patient's son, [name] ([place]), he states she cannot do a video visit, but if the visit could be done later in the day he could be there with her to do the video visit.  Time changed to 2:30pm.  Link sent to patient's son [name].  He verbalized understanding.  Nothing further needed.

## 2021-10-04 NOTE — Telephone Encounter (Signed)
Pt has been rescheduled to 11/8 at 2:30.  I called pt & spoke to her son [name] that is on [place].  He states someone from hospital called this morning and gave them the info.

## 2021-10-04 NOTE — Telephone Encounter (Signed)
Spoke to patient and relayed below message/recommendations.  Telephone visit scheduled for 10/07/2021 at 11:00. Patient unable to do video visit.    Dr. Valeta Harms, just to verify do you want to prescribe Molnupiravir?

## 2021-10-04 NOTE — Telephone Encounter (Signed)
Spoke to patient. Patient stated that she tested positive for covid on 10/03/2021. Sx started 10/02/2021. She stated that since speaking with Dr. Valeta Harms on 10/02/2021, her sx have worsened.  C/o body aches, wheezing, prod cough with yellow sputum,sore throat and sweats. Denies f/c/, n/v/d or additional sx. She does not have supplemental oxygen. Spo2 is maintaining around 92%. She had to use albuterol HFA three time throughout the night. Still doing Breo once daily.  Two covid vaccines.   Dr. Valeta Harms, please advise. Thanks

## 2021-10-04 NOTE — Telephone Encounter (Signed)
Verified with Dr. Valeta Harms via epic secure chat. He does want to prescribe Molnupiravir. Rx sent.  Patient is aware and voiced her understanding.  Nothing further needed at this time.

## 2021-10-07 ENCOUNTER — Telehealth: Payer: Medicare Other | Admitting: Adult Health

## 2021-10-07 ENCOUNTER — Telehealth (INDEPENDENT_AMBULATORY_CARE_PROVIDER_SITE_OTHER): Payer: Medicare Other | Admitting: Adult Health

## 2021-10-07 ENCOUNTER — Telehealth: Payer: Medicare Other | Admitting: Internal Medicine

## 2021-10-07 DIAGNOSIS — J438 Other emphysema: Secondary | ICD-10-CM

## 2021-10-07 DIAGNOSIS — R9389 Abnormal findings on diagnostic imaging of other specified body structures: Secondary | ICD-10-CM | POA: Diagnosis not present

## 2021-10-07 DIAGNOSIS — C3431 Malignant neoplasm of lower lobe, right bronchus or lung: Secondary | ICD-10-CM

## 2021-10-07 DIAGNOSIS — U071 COVID-19: Secondary | ICD-10-CM | POA: Diagnosis not present

## 2021-10-07 MED ORDER — MUCINEX DM 30-600 MG PO TB12: 1.0000 | ORAL_TABLET | Freq: Two times a day (BID) | ORAL | 0 refills | Status: DC

## 2021-10-07 MED ORDER — PREDNISONE 20 MG PO TABS: 20.0000 mg | ORAL_TABLET | Freq: Every day | ORAL | 0 refills | Status: DC

## 2021-10-07 NOTE — Progress Notes (Signed)
Virtual Visit via Video Note  I connected with Heather [name] on 10/07/21 at  2:30 PM EDT by a video enabled telemedicine application and verified that I am speaking with the correct person using two identifiers.  Location: Patient: Home  Provider: Office    I discussed the limitations of evaluation and management by telemedicine and the availability of in person appointments. The patient expressed understanding and agreed to proceed.  History of Present Illness: 86 year old female former smoker with a history of stage Ia non-small cell lung cancer diagnosed in 2019 status post SBRT, COPD Medical history significant for iron deficiency anemia on iron infusions.  Patient returns for a 2-week follow-up.  Patient was seen in the office last for a follow-up of recent CT chest that showed further increase in soft tissue mass in the right hilar area concerning for disease recurrence.  Patient has been followed by oncology with Dr. Earlie Server.  He was referred over to Dr. Valeta Harms.  Patient has been set up for EBUS.  Unfortunately patient tested positive for COVID-19 on October 24.  Her video bronchoscopy with EBUS has been rescheduled to November 8.  She was started on Molnupiravir , currently on day 3 of 5.  COVID-vaccine x2 with last vaccine October 2021.  Flu shot is up-to-date for 2022.  Patient has underlying COPD and is on Breo daily.  She says overall she has significant fatigue low energy body aches and decreased activity tolerance since testing positive for COVID.  She denies any fever hemoptysis or discolored mucus.  No increased leg swelling.  Says her O2 saturations today are 90% on room air.  She says over the weekend she did have a couple episodes where her O2 saturations dropped into the 80s but have been good for the last 24 hours. Patient says she is eating well with no nausea vomiting or diarrhea.   Past Medical History:  Diagnosis Date   Anemia    ANXIETY 07/23/2009   Arthritis     Atherosclerosis of aorta ([place]) 87/86/7672   Complication of anesthesia 7 or 8 yrs ago   woke up during colonscopy   COPD (chronic obstructive pulmonary disease) ([place])    Coronary atherosclerosis 07/24/2015   DEPRESSION 07/23/2009   DIVERTICULOSIS, COLON 07/23/2009   GERD 07/23/2009   Headache(784.0) 07/23/2009   occasional   Hemorrhoids    HIP PAIN, LEFT 05/16/2010   History of Crohn's disease    HYPERLIPIDEMIA 07/23/2009   HYPERTENSION 07/23/2009   HYPOTHYROIDISM 07/23/2009   IBS (irritable bowel syndrome)    Insomnia    LOW BACK PAIN, CHRONIC 10/01/2009   lung ca dx'd 2019   SBRT comp 12/2018   PARESTHESIA 10/01/2009   Shingles 2006   back   Current Outpatient Medications on File Prior to Visit  Medication Sig Dispense Refill   Albuterol Sulfate (PROAIR RESPICLICK) 094 (90 Base) MCG/ACT AEPB Inhale 2 puffs into the lungs 4 (four) times daily as needed. (Patient taking differently: Inhale 2 puffs into the lungs 4 (four) times daily as needed (sob and wheezing).) 1 each 6   clorazepate (TRANXENE) 7.5 MG tablet TAKE (1) TABLET TWICE DAILY AS NEEDED FOR ANXIETY. 60 tablet 5   docusate sodium (COLACE) 250 MG capsule Take 250 mg by mouth daily.     doxepin (SINEQUAN) 10 MG capsule TAKE 1 CAPSULE AT BEDTIME AS NEEDED. 90 capsule 0   esomeprazole (NEXIUM) 40 MG capsule Take 1 capsule (40 mg total) by mouth daily. 90 capsule 0  fluticasone furoate-vilanterol (BREO ELLIPTA) 200-25 MCG/INH AEPB Inhale 1 puff into the lungs daily. 425 each 0   folic acid (FOLVITE) 1 MG tablet Take 1 tablet (1 mg total) by mouth daily. 90 tablet 0   furosemide (LASIX) 20 MG tablet TAKE 1 TABLET DAILY IF NEEDED FOR SIGNIFICANT LOWER EXTREMITY SWELLING. 90 tablet 0   gabapentin (NEURONTIN) 100 MG capsule Take 1 capsule (100 mg total) by mouth 3 (three) times daily. 90 capsule 0   levothyroxine (SYNTHROID) 25 MCG tablet Take 1 tablet (25 mcg total) by mouth daily before breakfast. 90 tablet 0   losartan  (COZAAR) 25 MG tablet TAKE (1) TABLET DAILY FOR HIGH BLOOD PRESSURE. 90 tablet 0   Methylnaltrexone Bromide (RELISTOR) 150 MG TABS Take 3 tablets by mouth daily. (Patient taking differently: Take 150 mg by mouth daily.) 270 tablet 1   molnupiravir EUA (LAGEVRIO) 200 mg CAPS capsule Take 4 capsules (800 mg total) by mouth 2 (two) times daily for 5 days. 40 capsule 0   MYRBETRIQ 25 MG TB24 tablet TAKE 1 TABLET ONCE DAILY. 30 tablet 3   oxybutynin (DITROPAN-XL) 5 MG 24 hr tablet TAKE 1 TABLET IN THE MORNING FOR BLADDER CONTROL. 90 tablet 0   oxyCODONE (OXY IR/ROXICODONE) 5 MG immediate release tablet TAKE ONE TABLET BY MOUTH EVERY 6 HOURS AS NEEDED FOR SEVERE PAIN 90 tablet 0   Plecanatide (TRULANCE) 3 MG TABS Take 1 tablet by mouth daily. 90 tablet 1   polyvinyl alcohol (LIQUIFILM TEARS) 1.4 % ophthalmic solution Place 1 drop into both eyes as needed for dry eyes.     potassium chloride SA (KLOR-CON) 20 MEQ tablet Take 1 tablet (20 mEq total) by mouth daily. 90 tablet 0   promethazine (PHENERGAN) 25 MG tablet TAKE 1 TABLET EVERY 8 HOURS AS NEEDED FOR NAUSEA & VOMITING. 30 tablet 1   sertraline (ZOLOFT) 50 MG tablet TAKE 1 TABLET EACH DAY. 90 tablet 0   vitamin B-12 (CYANOCOBALAMIN) 50 MCG tablet Take 50 mcg by mouth daily.     VITAMIN D-1000 MAX ST 25 MCG (1000 UT) tablet Take 1,000 Units by mouth daily.     pregabalin (LYRICA) 25 MG capsule TAKE (1) CAPSULE TWICE DAILY. (Patient not taking: Reported on 10/07/2021) 60 capsule 3   No current facility-administered medications on file prior to visit.       Observations/Objective:  October 2022: CT scan of the chest with hilar adenopathy and possible hilar mass on the right side some evidence of radiation changes.  Assessment and Plan: COVID-19 infection.  Patient is at high risk with underlying COPD .  Recommend that she complete her antiviral pack.  We will give her prednisone 20 mg daily for 5 days.  COPD.  Continue on Breo.  Add Mucinex DM  twice daily as needed for cough and congestion.  Prednisone 20 mg daily for 5 days.  Abnormal CT chest.  Patient has a history of stage Ia non-small cell carcinoma.  Recent CT chest shows increased area in the right hilar.  She is to undergo video bronchoscopy with EBUS next week on November 8.  She is continue follow-up as planned.  Red flag symptoms were reviewed regarding COVID-19 infection such as hemoptysis, nausea vomiting diarrhea inability to take an oral fluids or food, hypoxia, etc   Plan  Patient Instructions  Finish Molnupiravir as directed.  Mucinex DM Twice daily  As needed  cough/congesiton  Prednisone 20mg  daily for 5 days , take w/ food.  Fluids and  rest  Continue on BREO 1 puff daily , rinse after use.  Albuterol inhaler As needed   Follow up for Bronchoscopy next week as planned  Follow up in 2 weeks as planned and As needed   Please contact office for sooner follow up if symptoms do not improve or worsen or seek emergency care       Follow Up Instructions: Follow-up in 2 weeks as planned and as needed   I discussed the assessment and treatment plan with the patient. The patient was provided an opportunity to ask questions and all were answered. The patient agreed with the plan and demonstrated an understanding of the instructions.   The patient was advised to call back or seek an in-person evaluation if the symptoms worsen or if the condition fails to improve as anticipated.  I provided 30 minutes of non-face-to-face time during this encounter.   Rexene Edison, NP

## 2021-10-07 NOTE — Patient Instructions (Addendum)
Finish Molnupiravir as directed.  Mucinex DM Twice daily  As needed  cough/congesiton  Prednisone 20mg  daily for 5 days , take w/ food.  Fluids and rest  Continue on BREO 1 puff daily , rinse after use.  Albuterol inhaler As needed   Follow up for Bronchoscopy next week as planned  Follow up in 2 weeks as planned and As needed   Please contact office for sooner follow up if symptoms do not improve or worsen or seek emergency care

## 2021-10-09 ENCOUNTER — Other Ambulatory Visit: Payer: Self-pay | Admitting: Physical Medicine and Rehabilitation

## 2021-10-09 ENCOUNTER — Other Ambulatory Visit: Payer: Self-pay | Admitting: Adult Health

## 2021-10-09 DIAGNOSIS — M7062 Trochanteric bursitis, left hip: Secondary | ICD-10-CM

## 2021-10-14 LAB — SARS CORONAVIRUS 2 (TAT 6-24 HRS): SARS Coronavirus 2: POSITIVE — AB

## 2021-10-14 NOTE — Progress Notes (Signed)
Spoke with pt's son, [name] for pre-op call. I spoke with him on 10/01/21 but pt's procedure got cancelled due to pt having Covid. He states nothing has changed with pt's medical history and medications. Updated instructions given to [name] and he voiced understanding.   Pt will not need a Covid test in the AM.

## 2021-10-15 ENCOUNTER — Other Ambulatory Visit: Payer: Self-pay

## 2021-10-15 ENCOUNTER — Encounter (HOSPITAL_COMMUNITY): Payer: Self-pay | Admitting: Pulmonary Disease

## 2021-10-15 ENCOUNTER — Encounter (HOSPITAL_COMMUNITY)
Admission: RE | Disposition: A | Discharge status: Home / Self Care | Payer: Self-pay | Source: Home / Self Care | Attending: Pulmonary Disease

## 2021-10-15 ENCOUNTER — Ambulatory Visit (HOSPITAL_COMMUNITY)
Admission: RE | Admit: 2021-10-15 | Discharge: 2021-10-15 | Disposition: A | Discharge status: Home / Self Care | Payer: Medicare Other | Attending: Pulmonary Disease | Admitting: Pulmonary Disease

## 2021-10-15 ENCOUNTER — Ambulatory Visit (HOSPITAL_COMMUNITY): Payer: Medicare Other | Admitting: Anesthesiology

## 2021-10-15 DIAGNOSIS — R9389 Abnormal findings on diagnostic imaging of other specified body structures: Secondary | ICD-10-CM | POA: Diagnosis not present

## 2021-10-15 DIAGNOSIS — R590 Localized enlarged lymph nodes: Secondary | ICD-10-CM | POA: Diagnosis not present

## 2021-10-15 DIAGNOSIS — C3431 Malignant neoplasm of lower lobe, right bronchus or lung: Secondary | ICD-10-CM | POA: Insufficient documentation

## 2021-10-15 DIAGNOSIS — E785 Hyperlipidemia, unspecified: Secondary | ICD-10-CM | POA: Insufficient documentation

## 2021-10-15 DIAGNOSIS — Z87891 Personal history of nicotine dependence: Secondary | ICD-10-CM | POA: Insufficient documentation

## 2021-10-15 DIAGNOSIS — E039 Hypothyroidism, unspecified: Secondary | ICD-10-CM | POA: Diagnosis not present

## 2021-10-15 DIAGNOSIS — I10 Essential (primary) hypertension: Secondary | ICD-10-CM | POA: Insufficient documentation

## 2021-10-15 DIAGNOSIS — J9601 Acute respiratory failure with hypoxia: Secondary | ICD-10-CM | POA: Diagnosis not present

## 2021-10-15 DIAGNOSIS — R599 Enlarged lymph nodes, unspecified: Secondary | ICD-10-CM | POA: Diagnosis not present

## 2021-10-15 DIAGNOSIS — J441 Chronic obstructive pulmonary disease with (acute) exacerbation: Secondary | ICD-10-CM | POA: Diagnosis not present

## 2021-10-15 HISTORY — DX: Dyspnea, unspecified: R06.00

## 2021-10-15 HISTORY — PX: VIDEO BRONCHOSCOPY WITH ENDOBRONCHIAL ULTRASOUND: SHX6177

## 2021-10-15 HISTORY — DX: Unspecified osteoarthritis, unspecified site: M19.90

## 2021-10-15 HISTORY — DX: Anemia, unspecified: D64.9

## 2021-10-15 HISTORY — PX: FINE NEEDLE ASPIRATION: SHX5430

## 2021-10-15 LAB — BASIC METABOLIC PANEL
Anion gap: 9 (ref 5–15)
BUN: 9 mg/dL (ref 8–23)
CO2: 27 mmol/L (ref 22–32)
Calcium: 9 mg/dL (ref 8.9–10.3)
Chloride: 101 mmol/L (ref 98–111)
Creatinine, Ser: 0.84 mg/dL (ref 0.44–1.00)
GFR, Estimated: >60 mL/min (ref >60)
Glucose, Bld: 97 mg/dL (ref 70–99)
Potassium: 4.1 mmol/L (ref 3.5–5.1)
Sodium: 137 mmol/L (ref 135–145)

## 2021-10-15 SURGERY — BRONCHOSCOPY, WITH EBUS
Anesthesia: General | Laterality: Right

## 2021-10-15 MED ORDER — MIDAZOLAM HCL 2 MG/2ML IJ SOLN
INTRAMUSCULAR | Status: DC | PRN
Start: 2021-10-15 — End: 2021-10-15
  Administered 2021-10-15: 2 mg via INTRAVENOUS

## 2021-10-15 MED ORDER — LIDOCAINE 2% (20 MG/ML) 5 ML SYRINGE
INTRAMUSCULAR | Status: DC | PRN
  Administered 2021-10-15: 60 mg via INTRAVENOUS

## 2021-10-15 MED ORDER — FENTANYL CITRATE (PF) 100 MCG/2ML IJ SOLN
INTRAMUSCULAR | Status: DC | PRN
  Administered 2021-10-15: 100 ug via INTRAVENOUS

## 2021-10-15 MED ORDER — ROCURONIUM BROMIDE 10 MG/ML (PF) SYRINGE
PREFILLED_SYRINGE | INTRAVENOUS | Status: DC | PRN
  Administered 2021-10-15: 50 mg via INTRAVENOUS

## 2021-10-15 MED ORDER — CHLORHEXIDINE GLUCONATE 0.12 % MT SOLN
OROMUCOSAL | Status: AC
  Administered 2021-10-15: 15 mL
  Filled 2021-10-15: qty 15

## 2021-10-15 MED ORDER — DEXAMETHASONE SODIUM PHOSPHATE 10 MG/ML IJ SOLN
INTRAMUSCULAR | Status: DC | PRN
Start: 2021-10-15 — End: 2021-10-15
  Administered 2021-10-15: 10 mg via INTRAVENOUS

## 2021-10-15 MED ORDER — ONDANSETRON HCL 4 MG/2ML IJ SOLN
INTRAMUSCULAR | Status: DC | PRN
  Administered 2021-10-15: 4 mg via INTRAVENOUS

## 2021-10-15 MED ORDER — LACTATED RINGERS IV SOLN: INTRAVENOUS | Status: DC

## 2021-10-15 MED ORDER — SUGAMMADEX SODIUM 200 MG/2ML IV SOLN
INTRAVENOUS | Status: DC | PRN
  Administered 2021-10-15: 200 mg via INTRAVENOUS

## 2021-10-15 MED ORDER — PROPOFOL 10 MG/ML IV BOLUS
INTRAVENOUS | Status: DC | PRN
  Administered 2021-10-15: 130 mg via INTRAVENOUS

## 2021-10-15 SURGICAL SUPPLY — 31 items
BRUSH CYTOL CELLEBRITY 1.5X140 (MISCELLANEOUS) IMPLANT
CANISTER SUCT 3000ML PPV (MISCELLANEOUS) ×4 IMPLANT
CONT SPEC 4OZ CLIKSEAL STRL BL (MISCELLANEOUS) ×4 IMPLANT
COVER BACK TABLE 60X90IN (DRAPES) ×4 IMPLANT
COVER DOME SNAP 22 D (MISCELLANEOUS) ×4 IMPLANT
FORCEPS BIOP RJ4 1.8 (CUTTING FORCEPS) IMPLANT
GAUZE SPONGE 4X4 12PLY STRL (GAUZE/BANDAGES/DRESSINGS) ×4 IMPLANT
GLOVE BIO SURGEON STRL SZ7.5 (GLOVE) ×4 IMPLANT
GOWN STRL REUS W/ TWL LRG LVL3 (GOWN DISPOSABLE) ×2 IMPLANT
GOWN STRL REUS W/TWL LRG LVL3 (GOWN DISPOSABLE) ×4
KIT CLEAN ENDO COMPLIANCE (KITS) ×8 IMPLANT
KIT TURNOVER KIT B (KITS) ×4 IMPLANT
MARKER SKIN DUAL TIP RULER LAB (MISCELLANEOUS) ×4 IMPLANT
NDL EBUS SONO TIP PENTAX (NEEDLE) ×2 IMPLANT
NEEDLE EBUS SONO TIP PENTAX (NEEDLE) ×4 IMPLANT
NS IRRIG 1000ML POUR BTL (IV SOLUTION) ×4 IMPLANT
OIL SILICONE PENTAX (PARTS (SERVICE/REPAIRS)) ×4 IMPLANT
PAD ARMBOARD 7.5X6 YLW CONV (MISCELLANEOUS) ×8 IMPLANT
SOL ANTI FOG 6CC (MISCELLANEOUS) ×2 IMPLANT
SOLUTION ANTI FOG 6CC (MISCELLANEOUS) ×2
SYR 20CC LL (SYRINGE) ×8 IMPLANT
SYR 20ML ECCENTRIC (SYRINGE) ×8 IMPLANT
SYR 50ML SLIP (SYRINGE) IMPLANT
SYR 5ML LUER SLIP (SYRINGE) ×4 IMPLANT
TOWEL OR 17X24 6PK STRL BLUE (TOWEL DISPOSABLE) ×4 IMPLANT
TRAP SPECIMEN MUCOUS 40CC (MISCELLANEOUS) IMPLANT
TUBE CONNECTING 20'X1/4 (TUBING) ×2
TUBE CONNECTING 20X1/4 (TUBING) ×6 IMPLANT
UNDERPAD 30X30 (UNDERPADS AND DIAPERS) ×4 IMPLANT
VALVE DISPOSABLE (MISCELLANEOUS) ×4 IMPLANT
WATER STERILE IRR 1000ML POUR (IV SOLUTION) ×4 IMPLANT

## 2021-10-15 NOTE — Interval H&P Note (Signed)
History and Physical Interval Note:  10/15/2021 3:23 PM  Heather [name]  has presented today for surgery, with the diagnosis of adenopathy.  The various methods of treatment have been discussed with the patient and family. After consideration of risks, benefits and other options for treatment, the patient has consented to  Procedure(s): [place] (Right) as a surgical intervention.  The patient's history has been reviewed, patient examined, no change in status, stable for surgery.  I have reviewed the patient's chart and labs.  Questions were answered to the patient's satisfaction.     [place]

## 2021-10-15 NOTE — Transfer of Care (Signed)
Immediate Anesthesia Transfer of Care Note  Patient: Heather [name]  Procedure(s) Performed: VIDEO BRONCHOSCOPY WITH ENDOBRONCHIAL ULTRASOUND (Right) FINE NEEDLE ASPIRATION (FNA) LINEAR  Patient Location: Endoscopy Unit  Anesthesia Type:General  Level of Consciousness: drowsy and patient cooperative  Airway & Oxygen Therapy: Patient Spontanous Breathing and Patient connected to nasal cannula oxygen  Post-op Assessment: Report given to RN, Post -op Vital signs reviewed and stable and Patient moving all extremities  Post vital signs: Reviewed and stable  Last Vitals:  Vitals Value Taken Time  BP 189/102 10/15/21 1627  Temp    Pulse 75 10/15/21 1627  Resp 21 10/15/21 1627  SpO2 100 % 10/15/21 1627  Vitals shown include unvalidated device data.  Last Pain:  Vitals:   10/15/21 1215  PainSc: 8       Patients Stated Pain Goal: 2 (29/19/16 6060)  Complications: No notable events documented.

## 2021-10-15 NOTE — Anesthesia Preprocedure Evaluation (Addendum)
Anesthesia Evaluation  Patient identified by MRN, date of birth, ID band Patient awake    Reviewed: Allergy & Precautions, NPO status , Patient's Chart, lab work & pertinent test results  Airway Mallampati: II  TM Distance: >3 FB Neck ROM: Full    Dental  (+) Upper Dentures, Loose, Poor Dentition,    Pulmonary COPD, former smoker,  Adenopathy    Pulmonary exam normal        Cardiovascular hypertension, Pt. on medications + CAD   Rhythm:Regular Rate:Normal     Neuro/Psych  Headaches, Anxiety Depression    GI/Hepatic Neg liver ROS, GERD  Medicated,  Endo/Other  Hypothyroidism   Renal/GU negative Renal ROS  negative genitourinary   Musculoskeletal  (+) Arthritis , Osteoarthritis,    Abdominal Normal abdominal exam  (+)   Peds  Hematology  (+) anemia ,   Anesthesia Other Findings   Reproductive/Obstetrics                            Anesthesia Physical Anesthesia Plan  ASA: 3  Anesthesia Plan: General   Post-op Pain Management:    Induction: Intravenous  PONV Risk Score and Plan: 3 and Ondansetron, Dexamethasone and Treatment may vary due to age or medical condition  Airway Management Planned: Mask and Oral ETT  Additional Equipment: None  Intra-op Plan:   Post-operative Plan: Extubation in OR  Informed Consent: I have reviewed the patients History and Physical, chart, labs and discussed the procedure including the risks, benefits and alternatives for the proposed anesthesia with the patient or authorized representative who has indicated his/her understanding and acceptance.     Dental advisory given  Plan Discussed with: CRNA  Anesthesia Plan Comments: (Lab Results      Component                Value               Date                      WBC                      7.7                 09/17/2021                HGB                      11.5 (L)            09/17/2021                 HCT                      35.1 (L)            09/17/2021                MCV                      93.4                09/17/2021                PLT                      445 (H)  09/17/2021           Lab Results      Component                Value               Date                      NA                       138                 04/10/2021                K                        4.1                 04/10/2021                CO2                      26                  04/10/2021                GLUCOSE                  101 (H)             04/10/2021                BUN                      9                   04/10/2021                CREATININE               0.60                09/17/2021                CALCIUM                  9.4                 04/10/2021                GFRNONAA                 >60                 04/10/2021           ECHO 02/21: 1. Left ventricular ejection fraction, by estimation, is 55 to 60%. The  left ventricle has normal function. The left ventricle has no regional  wall motion abnormalities. Left ventricular diastolic parameters are  consistent with Grade I diastolic  dysfunction (impaired relaxation).  2. Right ventricular systolic function is normal. The right ventricular  size is normal. There is mildly elevated pulmonary artery systolic  pressure.  3. The mitral valve is grossly normal. Mild mitral valve regurgitation.  4. Tricuspid valve regurgitation is mild to moderate.  5. The aortic valve is grossly normal. Aortic valve regurgitation is  trivial.  6. The inferior vena cava is dilated  in size with <50% respiratory  variability, suggesting right atrial pressure of 15 mmHg. )        Anesthesia Quick Evaluation

## 2021-10-15 NOTE — Anesthesia Procedure Notes (Signed)
Procedure Name: Intubation Date/Time: 10/15/2021 3:32 PM Performed by: Moshe Salisbury, CRNA Pre-anesthesia Checklist: Patient identified, Emergency Drugs available, Suction available and Patient being monitored Patient Re-evaluated:Patient Re-evaluated prior to induction Oxygen Delivery Method: Circle System Utilized Preoxygenation: Pre-oxygenation with 100% oxygen Induction Type: IV induction Ventilation: Mask ventilation without difficulty Laryngoscope Size: Mac and 3 Grade View: Grade I Tube type: Oral Tube size: 9.0 mm Number of attempts: 1 Airway Equipment and Method: Stylet Placement Confirmation: ETT inserted through vocal cords under direct vision, positive ETCO2 and breath sounds checked- equal and bilateral Secured at: 19 cm Tube secured with: Tape Dental Injury: Teeth and Oropharynx as per pre-operative assessment

## 2021-10-15 NOTE — Op Note (Signed)
Video Bronchoscopy with Endobronchial Ultrasound Procedure Note  Date of Operation: 10/15/2021  Pre-op Diagnosis: Mediastinal adenopathy  Post-op Diagnosis: Mediastinal adenopathy  Surgeon: Garner Nash, DO   Assistants: None   Anesthesia: General endotracheal anesthesia  Operation: Flexible video fiberoptic bronchoscopy with endobronchial ultrasound and biopsies.  Estimated Blood Loss: Minimal  Complications: None   Indications and History: Heather [name] is a 86 y.o. female with history of presumed malignancy status post radiation, now with mediastinal adenopathy.  The risks, benefits, complications, treatment options and expected outcomes were discussed with the patient.  The possibilities of pneumothorax, pneumonia, reaction to medication, pulmonary aspiration, perforation of a viscus, bleeding, failure to diagnose a condition and creating a complication requiring transfusion or operation were discussed with the patient who freely signed the consent.    Description of Procedure: The patient was examined in the preoperative area and history and data from the preprocedure consultation were reviewed. It was deemed appropriate to proceed.  The patient was taken to [facility] endoscopy room 3, identified as Heather [name] and the procedure verified as Flexible Video Fiberoptic Bronchoscopy.  A Time Out was held and the above information confirmed. After being taken to the operating room general anesthesia was initiated and the patient  was orally intubated. The video fiberoptic bronchoscope was introduced via the endotracheal tube and a general inspection was performed which showed normal right and left lung anatomy no evidence of endobronchial lesion. The standard scope was then withdrawn and the endobronchial ultrasound was used to identify and characterize the peritracheal, hilar and bronchial lymph nodes. Inspection showed enlarged right hilar node at approximately 12 mm in size, small  7 mm area in the subcarinal space with lots of surrounding connective tissue.. Using real-time ultrasound guidance Wang needle biopsies were take from Station 10 R, right hilum nodes and were sent for cytology. The patient tolerated the procedure well without apparent complications. There was no significant blood loss. The bronchoscope was withdrawn. Anesthesia was reversed and the patient was taken to the PACU for recovery.   Samples: 1. Wang needle biopsies from 10 R, right hilar node  Plans:  The patient will be discharged from the PACU to home when recovered from anesthesia. We will review the cytology, pathology and microbiology results with the patient when they become available. Outpatient followup will be with Garner Nash, DO.    Garner Nash, DO [place] [facility] 10/15/2021 4:13 PM

## 2021-10-16 ENCOUNTER — Encounter (HOSPITAL_COMMUNITY): Payer: Self-pay | Admitting: Pulmonary Disease

## 2021-10-16 LAB — CYTOLOGY - NON PAP

## 2021-10-16 NOTE — Anesthesia Postprocedure Evaluation (Signed)
Anesthesia Post Note  Patient: Heather [name]  Procedure(s) Performed: VIDEO BRONCHOSCOPY WITH ENDOBRONCHIAL ULTRASOUND (Right) FINE NEEDLE ASPIRATION (FNA) LINEAR     Patient location during evaluation: PACU Anesthesia Type: General Level of consciousness: awake and alert Pain management: pain level controlled Vital Signs Assessment: post-procedure vital signs reviewed and stable Respiratory status: spontaneous breathing, nonlabored ventilation, respiratory function stable and patient connected to nasal cannula oxygen Cardiovascular status: blood pressure returned to baseline and stable Postop Assessment: no apparent nausea or vomiting Anesthetic complications: no   No notable events documented.  Last Vitals:  Vitals:   10/15/21 1647 10/15/21 1648  BP: 98/62 (!) 124/97  Pulse: 66 66  Resp: 13 17  Temp:    SpO2: 98% 96%    Last Pain:  Vitals:   10/15/21 1648  TempSrc:   PainSc: 9                  Dreya Buhrman P Rhiannan Kievit

## 2021-10-17 ENCOUNTER — Other Ambulatory Visit: Payer: Self-pay | Admitting: Internal Medicine

## 2021-10-20 ENCOUNTER — Other Ambulatory Visit: Payer: Self-pay | Admitting: Internal Medicine

## 2021-10-20 DIAGNOSIS — M1612 Unilateral primary osteoarthritis, left hip: Secondary | ICD-10-CM

## 2021-10-20 DIAGNOSIS — G4701 Insomnia due to medical condition: Secondary | ICD-10-CM

## 2021-10-20 DIAGNOSIS — G8929 Other chronic pain: Secondary | ICD-10-CM

## 2021-10-21 ENCOUNTER — Inpatient Hospital Stay: Payer: Medicare Other | Attending: Physician Assistant | Admitting: Internal Medicine

## 2021-10-21 ENCOUNTER — Other Ambulatory Visit: Payer: Self-pay

## 2021-10-21 ENCOUNTER — Inpatient Hospital Stay: Payer: Medicare Other

## 2021-10-21 VITALS — BP 134/68 | HR 87 | Temp 98.2°F | Resp 18 | Ht 58.5 in | Wt 126.9 lb

## 2021-10-21 DIAGNOSIS — C3490 Malignant neoplasm of unspecified part of unspecified bronchus or lung: Secondary | ICD-10-CM

## 2021-10-21 DIAGNOSIS — D509 Iron deficiency anemia, unspecified: Secondary | ICD-10-CM | POA: Diagnosis not present

## 2021-10-21 DIAGNOSIS — Z85118 Personal history of other malignant neoplasm of bronchus and lung: Secondary | ICD-10-CM | POA: Diagnosis not present

## 2021-10-21 DIAGNOSIS — D500 Iron deficiency anemia secondary to blood loss (chronic): Secondary | ICD-10-CM

## 2021-10-21 LAB — CBC WITH DIFFERENTIAL (CANCER CENTER ONLY)
Abs Immature Granulocytes: 0.1 10*3/uL — ABNORMAL HIGH (ref 0.00–0.07)
Basophils Absolute: 0 10*3/uL (ref 0.0–0.1)
Basophils Relative: 0 %
Eosinophils Absolute: 0.1 10*3/uL (ref 0.0–0.5)
Eosinophils Relative: 1 %
HCT: 36.1 % (ref 36.0–46.0)
Hemoglobin: 11.9 g/dL — ABNORMAL LOW (ref 12.0–15.0)
Immature Granulocytes: 1 %
Lymphocytes Relative: 13 %
Lymphs Abs: 1.7 10*3/uL (ref 0.7–4.0)
MCH: 30.1 pg (ref 26.0–34.0)
MCHC: 33 g/dL (ref 30.0–36.0)
MCV: 91.2 fL (ref 80.0–100.0)
Monocytes Absolute: 1 10*3/uL (ref 0.1–1.0)
Monocytes Relative: 8 %
Neutro Abs: 10 10*3/uL — ABNORMAL HIGH (ref 1.7–7.7)
Neutrophils Relative %: 77 %
Platelet Count: ADEQUATE 10*3/uL (ref 150–400)
RBC: 3.96 MIL/uL (ref 3.87–5.11)
RDW: 13.2 % (ref 11.5–15.5)
Smear Review: ADEQUATE
WBC Count: 12.9 10*3/uL — ABNORMAL HIGH (ref 4.0–10.5)
nRBC: 0 % (ref 0.0–0.2)

## 2021-10-21 MED ORDER — DOXYCYCLINE HYCLATE 100 MG PO TABS: 100.0000 mg | ORAL_TABLET | Freq: Two times a day (BID) | ORAL | 0 refills | Status: DC

## 2021-10-21 NOTE — Progress Notes (Signed)
[facility] [phone]   [phone]  OFFICE PROGRESS NOTE  Heather Lima, MD [place] [address]  DIAGNOSIS:  1) Microcytic anemia secondary to iron deficiency from likely gastrointestinal blood loss. 2) history of stage IA2 (T1b, N0, M0) non-small cell lung cancer diagnosed in December 2019 status post SBRT under the care of Dr. Lisbeth Renshaw.  PRIOR THERAPY: Iron infusion with Venofer 300 mg IV weekly for 3 doses.  CURRENT THERAPY: Observation.  INTERVAL HISTORY: Heather [name] 86 y.o. female returns to the clinic today for follow-up visit accompanied by her son.  The patient is feeling fine today with no concerning complaints except for shortness of breath at was getting worse after the bronchoscopy.  She denied having any current chest pain but has mild cough with no hemoptysis.  She denied having any fever or chills she has no nausea, vomiting, diarrhea or constipation.  She has no headache or visual changes.  She was found on previous CT scan of the chest to have increased opacity in her lung and she was referred to Dr. Ander Slade for consideration of bronchoscopy and evaluation of this increased consolidation.  The patient underwent bronchoscopy under the care of Dr. Valeta Harms and the final pathology showed no evidence of malignancy.  She is here today for evaluation and recommendation regarding her condition.  MEDICAL HISTORY: Past Medical History:  Diagnosis Date   Anemia    ANXIETY 07/23/2009   Arthritis    Atherosclerosis of aorta ([place]) 95/62/1308   Complication of anesthesia 7 or 8 yrs ago   woke up during colonscopy   COPD (chronic obstructive pulmonary disease) ([place])    Coronary atherosclerosis 07/24/2015   DEPRESSION 07/23/2009   DIVERTICULOSIS, COLON 07/23/2009   Dyspnea    GERD 07/23/2009   Headache(784.0) 07/23/2009   occasional   Hemorrhoids    HIP PAIN, LEFT 05/16/2010   History of Crohn's disease     HYPERLIPIDEMIA 07/23/2009   HYPERTENSION 07/23/2009   HYPOTHYROIDISM 07/23/2009   IBS (irritable bowel syndrome)    Insomnia    LOW BACK PAIN, CHRONIC 10/01/2009   lung ca dx'd 2019   SBRT comp 12/2018   PARESTHESIA 10/01/2009   Shingles 2006   back    ALLERGIES:  is allergic to tylenol [acetaminophen].  MEDICATIONS:  Current Outpatient Medications  Medication Sig Dispense Refill   doxycycline (VIBRA-TABS) 100 MG tablet Take 1 tablet (100 mg total) by mouth 2 (two) times daily. 20 tablet 0   Albuterol Sulfate (PROAIR RESPICLICK) 657 (90 Base) MCG/ACT AEPB Inhale 2 puffs into the lungs 4 (four) times daily as needed. (Patient taking differently: Inhale 2 puffs into the lungs 4 (four) times daily as needed (sob and wheezing).) 1 each 6   clorazepate (TRANXENE) 7.5 MG tablet TAKE (1) TABLET TWICE DAILY AS NEEDED FOR ANXIETY. 60 tablet 5   Dextromethorphan-guaiFENesin (MUCINEX DM) 30-600 MG TB12 Take 1 tablet by mouth 2 (two) times daily. 60 tablet 0   docusate sodium (COLACE) 250 MG capsule Take 250 mg by mouth daily.     doxepin (SINEQUAN) 10 MG capsule TAKE 1 CAPSULE AT BEDTIME AS NEEDED. 90 capsule 0   esomeprazole (NEXIUM) 40 MG capsule Take 1 capsule (40 mg total) by mouth daily. 90 capsule 0   fluticasone furoate-vilanterol (BREO ELLIPTA) 200-25 MCG/INH AEPB Inhale 1 puff into the lungs daily. 846 each 0   folic acid (FOLVITE) 1 MG tablet Take 1 tablet (1 mg  total) by mouth daily. 90 tablet 0   furosemide (LASIX) 20 MG tablet TAKE 1 TABLET DAILY IF NEEDED FOR SIGNIFICANT LOWER EXTREMITY SWELLING. 90 tablet 0   gabapentin (NEURONTIN) 100 MG capsule TAKE ONE CAPSULE BY MOUTH THREE TIMES DAILY 90 capsule 1   levothyroxine (SYNTHROID) 25 MCG tablet Take 1 tablet (25 mcg total) by mouth daily before breakfast. 90 tablet 0   losartan (COZAAR) 25 MG tablet TAKE (1) TABLET DAILY FOR HIGH BLOOD PRESSURE. 90 tablet 0   Methylnaltrexone Bromide (RELISTOR) 150 MG TABS Take 3 tablets by mouth  daily. (Patient taking differently: Take 150 mg by mouth daily.) 270 tablet 1   MYRBETRIQ 25 MG TB24 tablet TAKE 1 TABLET ONCE DAILY. 30 tablet 3   oxybutynin (DITROPAN-XL) 5 MG 24 hr tablet TAKE 1 TABLET IN THE MORNING FOR BLADDER CONTROL. 90 tablet 0   oxyCODONE (OXY IR/ROXICODONE) 5 MG immediate release tablet TAKE ONE TABLET BY MOUTH EVERY 6 HOURS AS NEEDED FOR SEVERE PAIN 90 tablet 0   Plecanatide (TRULANCE) 3 MG TABS Take 1 tablet by mouth daily. 90 tablet 1   polyvinyl alcohol (LIQUIFILM TEARS) 1.4 % ophthalmic solution Place 1 drop into both eyes as needed for dry eyes.     potassium chloride SA (KLOR-CON) 20 MEQ tablet Take 1 tablet (20 mEq total) by mouth daily. 90 tablet 0   pregabalin (LYRICA) 25 MG capsule TAKE (1) CAPSULE TWICE DAILY. 60 capsule 3   promethazine (PHENERGAN) 25 MG tablet TAKE ONE TABLET BY MOUTH EVERY 8 HOURS AS NEEDED FOR NAUSEA AND VOMITING 30 tablet 1   sertraline (ZOLOFT) 50 MG tablet TAKE 1 TABLET EACH DAY. 90 tablet 0   vitamin B-12 (CYANOCOBALAMIN) 50 MCG tablet Take 50 mcg by mouth daily.     VITAMIN D-1000 MAX ST 25 MCG (1000 UT) tablet Take 1,000 Units by mouth daily.     No current facility-administered medications for this visit.    SURGICAL HISTORY:  Past Surgical History:  Procedure Laterality Date   ABDOMINAL HYSTERECTOMY  age 58 or 68   APPENDECTOMY  yrs ago   BILATERAL SALPINGOOPHORECTOMY  age 3 or 70   COLONOSCOPY N/A 11/17/2013   Procedure: COLONOSCOPY;  Surgeon: Cleotis Nipper, MD;  Location: [facility];  Service: Endoscopy;  Laterality: N/A;   COLONOSCOPY N/A 07/16/2021   ESOPHAGOGASTRODUODENOSCOPY N/A 11/17/2013   Procedure: ESOPHAGOGASTRODUODENOSCOPY (EGD);  Surgeon: Cleotis Nipper, MD;  Location: [facility];  Service: Endoscopy;  Laterality: N/A;   FINE NEEDLE ASPIRATION  10/15/2021   Procedure: FINE NEEDLE ASPIRATION (FNA) LINEAR;  Surgeon: Garner Nash, DO;  Location: [facility];  Service: Pulmonary;;   HEMORRHOID  SURGERY  yrs ago   TONSILLECTOMY  yrs ago   [place] Right 10/15/2021   Procedure: VIDEO BRONCHOSCOPY WITH ENDOBRONCHIAL ULTRASOUND;  Surgeon: Garner Nash, DO;  Location: [facility];  Service: Pulmonary;  Laterality: Right;    REVIEW OF SYSTEMS:  Constitutional: positive for fatigue Eyes: negative Ears, nose, mouth, throat, and face: negative Respiratory: positive for dyspnea on exertion Cardiovascular: negative Gastrointestinal: negative Genitourinary:negative Integument/breast: negative Hematologic/lymphatic: negative Musculoskeletal:negative Neurological: negative Behavioral/Psych: negative Endocrine: negative Allergic/Immunologic: negative   PHYSICAL EXAMINATION: General appearance: alert, cooperative, and no distress Head: Normocephalic, without obvious abnormality, atraumatic Neck: no adenopathy, no JVD, supple, symmetrical, trachea midline, and thyroid not enlarged, symmetric, no tenderness/mass/nodules Lymph nodes: Cervical, supraclavicular, and axillary nodes normal. Resp: clear to auscultation bilaterally Back: symmetric, no curvature. ROM normal. No CVA tenderness. Cardio:  regular rate and rhythm, S1, S2 normal, no murmur, click, rub or gallop GI: soft, non-tender; bowel sounds normal; no masses,  no organomegaly Extremities: extremities normal, atraumatic, no cyanosis or edema Neurologic: Alert and oriented X 3, normal strength and tone. Normal symmetric reflexes. Normal coordination and gait  ECOG PERFORMANCE STATUS: 1 - Symptomatic but completely ambulatory  Blood pressure 134/68, pulse 87, temperature 98.2 F (36.8 C), temperature source Tympanic, resp. rate 18, height 4' 10.5" (1.486 m), weight 126 lb 14.4 oz (57.6 kg), SpO2 97 %.  LABORATORY DATA: Lab Results  Component Value Date   WBC 12.9 (H) 10/21/2021   HGB 11.9 (L) 10/21/2021   HCT 36.1 10/21/2021   MCV 91.2 10/21/2021   PLT  10/21/2021    PLATELET  CLUMPS NOTED ON SMEAR, COUNT APPEARS ADEQUATE      Chemistry      Component Value Date/Time   NA 137 10/15/2021 1158   K 4.1 10/15/2021 1158   CL 101 10/15/2021 1158   CO2 27 10/15/2021 1158   BUN 9 10/15/2021 1158   CREATININE 0.84 10/15/2021 1158   CREATININE 0.94 04/10/2021 1309      Component Value Date/Time   CALCIUM 9.0 10/15/2021 1158   ALKPHOS 85 04/10/2021 1309   AST 16 04/10/2021 1309   ALT 7 04/10/2021 1309   BILITOT 0.4 04/10/2021 1309       RADIOGRAPHIC STUDIES: No results found.  ASSESSMENT AND PLAN: This is a very pleasant 86 years old white female with history of iron deficiency anemia likely from gastrointestinal blood loss.  The patient was treated with iron infusion with Venofer 300 mg IV weekly for 3 weeks and she has significant improvement in her condition as well as the fatigue.  Her hemoglobin is up to 11.9. She also has a stage Ia non-small cell lung cancer status post SBRT under the care of Dr. Lisbeth Renshaw in December 2019 The most recent CT scan of the chest on September 17, 2021 showed increased consolidation in the right lower lobe concerning for disease recurrence.  The patient underwent repeat bronchoscopy under the care of Dr. Valeta Harms and the final pathology was negative for malignancy. He was also diagnosed with COVID-19 2 weeks ago and it may have been a contributing factor to this abnormality in her care.  I discussed the pathology report with the patient and her son and recommended for her to continue on observation with repeat CT scan of the chest in 3 months for reevaluation of disease progression or worsening of her disease in the meantime I will.  The patient empirically with doxycycline 100 mg p.o. twice daily. The patient was advised to call immediately if she has any other concerning symptoms in the interval. All questions were answered. The patient knows to call the clinic with any problems, questions or concerns. We can certainly see the patient much  sooner if necessary. The total time spent in the appointment was 20 minutes. The total time spent in the appointment was 30 minutes.  Disclaimer: This note was dictated with voice recognition software. Similar sounding words can inadvertently be transcribed and may not be corrected upon review.

## 2021-10-22 ENCOUNTER — Telehealth: Payer: Self-pay | Admitting: Internal Medicine

## 2021-10-22 NOTE — Telephone Encounter (Signed)
Scheduled follow-up appointments per 11/14 los. Patient's son is aware.

## 2021-10-23 ENCOUNTER — Other Ambulatory Visit: Payer: Self-pay

## 2021-10-23 ENCOUNTER — Ambulatory Visit: Payer: Medicare Other | Admitting: Acute Care

## 2021-10-23 ENCOUNTER — Encounter: Payer: Self-pay | Admitting: Acute Care

## 2021-10-23 VITALS — HR 94 | Ht 59.0 in | Wt 126.0 lb

## 2021-10-23 DIAGNOSIS — C3490 Malignant neoplasm of unspecified part of unspecified bronchus or lung: Secondary | ICD-10-CM

## 2021-10-23 DIAGNOSIS — J069 Acute upper respiratory infection, unspecified: Secondary | ICD-10-CM | POA: Diagnosis not present

## 2021-10-23 NOTE — Patient Instructions (Addendum)
It is  good to see you today. Continue Mucinex  600 mg to help thin secretions/ Take with a full glass of water.  Take Doxycycline until gone. Take 1 tablet twice daily Be mindful of the sun on this medication  Continue rescue inhaler as needed for shortness of breath or wheezing up to 3 times daily. Your Biopsy was negative for malignant cells.  3 month follow up CT Chest as scheduled for 12/16/2021.  We will have you follow up with Dr. Earlie Server 12/18/2021 Follow up with [place] as needed.  Please contact office for sooner follow up if symptoms do not improve or worsen or seek emergency care

## 2021-10-23 NOTE — Progress Notes (Signed)
History of Present Illness Heather [name] is a 86 y.o. female with Microcytic anemia secondary to iron deficiency from likely gastrointestinal blood loss.and  history of stage IA2 (T1b, N0, M0) non-small cell lung cancer diagnosed in December 2019 status post SBRT under the care of Dr. Lisbeth Renshaw. She was seen by Dr. Valeta Harms for bronchoscopy as she had an abnormal CT Chest that was concerning for recurrent disease.  Heather [name] She was diagnosed with  Covid 10/24. She was treated with Molnupiravir .She has recovered    10/23/2021 Pt. Presents for follow up. She had a video bronchoscopy with EBUS on 10/15/2021 as follow up to a surveillance CT Chest done 09/17/2021. Dr. Valeta Harms Biopsied her 10 R  node which was negative for malignant cells.  She states she did have some bleeding after the procedure. She has recovered. She did see Dr. Earlie Server Monday 11/14. He started her on Doxycycline for new wheezing , as he was concerned she may be developing a secondary infection post bronch. Her WBC was 12.9  at that appointment, and has previously been  7.7 one month prior. She started the Doxy yesterday 11/15. She denies fever. She does have a cough. She is using tessalon perles. She is taking Mucinex and she is using a rescue inhaler. She said her wheezing does not go away with her rescue inhaler. She does not wear oxygen at home. Sats were 94% today in the office. She is in a wheelchair. She states she has a hard time walking any significant distance. She denies fever, chest pain, or orthopnea.  She states her secretions are yellow. Secretions are thick.    Test Results: 10 R Node Biopsy 10/15/2021 FINAL MICROSCOPIC DIAGNOSIS:   A. LYMPH NODE, 10R, FINE NEEDLE ASPIRATION:  - No malignant cells identified   SPECIMEN ADEQUACY:  A. Satisfactory but Limited for Evaluation    CT Chest with Contrast Continued progression of mass-like thickening in the central aspect of the right middle and lower lobes best appreciated on  axial image 84 of series 7 where this lesion overall measures 3.8 x 2.6 cm, clearly progressively enlarging compared to prior examinations. Other chronic area of mass-like architectural distortion in the more inferior aspect of the right lower lobe (axial image 98 of series 7) currently measures 6.7 x 3.4 cm, also slightly larger than prior examinations. No acute consolidative airspace disease. Trace right pleural effusion, increased compared to the prior examination. No definite left pleural effusion. Diffuse bronchial wall thickening with mild to moderate centrilobular and paraseptal emphysema.    Findings are highly concerning for recurrent disease as demonstrated by enlarging mass-like areas of architectural distortion in the right lower lobe at the base, as well as in the central aspect of the right middle and lower lobes, contiguous with right infrahilar/hilar lymphadenopathy, as detailed above. Further evaluation with PET-CT is strongly recommended. 2. Diffuse bronchial wall thickening with mild to moderate centrilobular and paraseptal emphysema; imaging findings suggestive of underlying COPD. 3. Aortic atherosclerosis, in addition to left main and 3 vessel coronary artery disease. Please note that although the presence of coronary artery calcium documents the presence of coronary artery disease, the severity of this disease and any potential stenosis cannot be assessed on this non-gated CT examination. Assessment for potential risk factor modification, dietary therapy or pharmacologic therapy may be warranted, if clinically indicated.     CBC Latest Ref Rng & Units 10/21/2021 09/17/2021 06/18/2021  WBC 4.0 - 10.5 K/uL 12.9(H) 7.7 7.0  Hemoglobin 12.0 -  15.0 g/dL 11.9(L) 11.5(L) 11.9(L)  Hematocrit 36.0 - 46.0 % 36.1 35.1(L) 37.5  Platelets 150 - 400 K/uL PLATELET CLUMPS NOTED ON SMEAR, COUNT APPEARS ADEQUATE 445(H) 291    BMP Latest Ref Rng & Units 10/15/2021 09/17/2021  06/14/2021  Glucose 70 - 99 mg/dL 97 - -  BUN 8 - 23 mg/dL 9 - -  Creatinine 0.44 - 1.00 mg/dL 0.84 0.60 0.70  Sodium 135 - 145 mmol/L 137 - -  Potassium 3.5 - 5.1 mmol/L 4.1 - -  Chloride 98 - 111 mmol/L 101 - -  CO2 22 - 32 mmol/L 27 - -  Calcium 8.9 - 10.3 mg/dL 9.0 - -    BNP    Component Value Date/Time   BNP 128.7 (H) 01/28/2018 1422    ProBNP    Component Value Date/Time   PROBNP 68.0 05/31/2018 1646    PFT    Component Value Date/Time   FEV1PRE 1.09 08/13/2018 0949   FEV1POST 1.24 08/13/2018 0949   FVCPRE 1.90 08/13/2018 0949   FVCPOST 2.02 08/13/2018 0949   DLCOUNC 8.62 08/13/2018 0949   PREFEV1FVCRT 57 08/13/2018 0949   PSTFEV1FVCRT 61 08/13/2018 0949    No results found.   Past medical hx Past Medical History:  Diagnosis Date   Anemia    ANXIETY 07/23/2009   Arthritis    Atherosclerosis of aorta ([place]) 29/52/8413   Complication of anesthesia 7 or 8 yrs ago   woke up during colonscopy   COPD (chronic obstructive pulmonary disease) ([place])    Coronary atherosclerosis 07/24/2015   DEPRESSION 07/23/2009   DIVERTICULOSIS, COLON 07/23/2009   Dyspnea    GERD 07/23/2009   Headache(784.0) 07/23/2009   occasional   Hemorrhoids    HIP PAIN, LEFT 05/16/2010   History of Crohn's disease    HYPERLIPIDEMIA 07/23/2009   HYPERTENSION 07/23/2009   HYPOTHYROIDISM 07/23/2009   IBS (irritable bowel syndrome)    Insomnia    LOW BACK PAIN, CHRONIC 10/01/2009   lung ca dx'd 2019   SBRT comp 12/2018   PARESTHESIA 10/01/2009   Shingles 2006   back     Social History   Tobacco Use   Smoking status: Former    Packs/day: 0.50    Types: Cigarettes    Start date: 07/16/1960    Quit date: 07/16/1998    Years since quitting: 23.2   Smokeless tobacco: Never  Vaping Use   Vaping Use: Never used  Substance Use Topics   Alcohol use: No    Alcohol/week: 0.0 standard drinks   Drug use: No    [name] reports that she quit smoking about 23 years ago. Her smoking  use included cigarettes. She started smoking about 61 years ago. She smoked an average of .5 packs per day. She has never used smokeless tobacco. She reports that she does not drink alcohol and does not use drugs.  Tobacco Cessation: Former smoker    Past surgical hx, Family hx, Social hx all reviewed.  Current Outpatient Medications on File Prior to Visit  Medication Sig   Albuterol Sulfate (PROAIR RESPICLICK) 244 (90 Base) MCG/ACT AEPB Inhale 2 puffs into the lungs 4 (four) times daily as needed. (Patient taking differently: Inhale 2 puffs into the lungs 4 (four) times daily as needed (sob and wheezing).)   clorazepate (TRANXENE) 7.5 MG tablet TAKE (1) TABLET TWICE DAILY AS NEEDED FOR ANXIETY.   Dextromethorphan-guaiFENesin (MUCINEX DM) 30-600 MG TB12 Take 1 tablet by mouth 2 (two) times daily.   docusate sodium (COLACE)  250 MG capsule Take 250 mg by mouth daily.   doxepin (SINEQUAN) 10 MG capsule TAKE 1 CAPSULE AT BEDTIME AS NEEDED.   doxycycline (VIBRA-TABS) 100 MG tablet Take 1 tablet (100 mg total) by mouth 2 (two) times daily.   esomeprazole (NEXIUM) 40 MG capsule Take 1 capsule (40 mg total) by mouth daily.   fluticasone furoate-vilanterol (BREO ELLIPTA) 200-25 MCG/INH AEPB Inhale 1 puff into the lungs daily.   folic acid (FOLVITE) 1 MG tablet Take 1 tablet (1 mg total) by mouth daily.   furosemide (LASIX) 20 MG tablet TAKE 1 TABLET DAILY IF NEEDED FOR SIGNIFICANT LOWER EXTREMITY SWELLING.   gabapentin (NEURONTIN) 100 MG capsule TAKE ONE CAPSULE BY MOUTH THREE TIMES DAILY   levothyroxine (SYNTHROID) 25 MCG tablet Take 1 tablet (25 mcg total) by mouth daily before breakfast.   losartan (COZAAR) 25 MG tablet TAKE (1) TABLET DAILY FOR HIGH BLOOD PRESSURE.   Methylnaltrexone Bromide (RELISTOR) 150 MG TABS Take 3 tablets by mouth daily. (Patient taking differently: Take 150 mg by mouth daily.)   MYRBETRIQ 25 MG TB24 tablet TAKE 1 TABLET ONCE DAILY.   oxybutynin (DITROPAN-XL) 5 MG 24 hr  tablet TAKE 1 TABLET IN THE MORNING FOR BLADDER CONTROL.   oxyCODONE (OXY IR/ROXICODONE) 5 MG immediate release tablet TAKE ONE TABLET BY MOUTH EVERY 6 HOURS AS NEEDED FOR SEVERE PAIN   Plecanatide (TRULANCE) 3 MG TABS Take 1 tablet by mouth daily.   polyvinyl alcohol (LIQUIFILM TEARS) 1.4 % ophthalmic solution Place 1 drop into both eyes as needed for dry eyes.   potassium chloride SA (KLOR-CON) 20 MEQ tablet Take 1 tablet (20 mEq total) by mouth daily.   pregabalin (LYRICA) 25 MG capsule TAKE (1) CAPSULE TWICE DAILY.   promethazine (PHENERGAN) 25 MG tablet TAKE ONE TABLET BY MOUTH EVERY 8 HOURS AS NEEDED FOR NAUSEA AND VOMITING   sertraline (ZOLOFT) 50 MG tablet TAKE 1 TABLET EACH DAY.   vitamin B-12 (CYANOCOBALAMIN) 50 MCG tablet Take 50 mcg by mouth daily.   VITAMIN D-1000 MAX ST 25 MCG (1000 UT) tablet Take 1,000 Units by mouth daily.   No current facility-administered medications on file prior to visit.     Allergies  Allergen Reactions   Tylenol [Acetaminophen] Swelling and Other (See Comments)    Nightmares, tongue swelling     Review Of Systems:  Constitutional:   No  weight loss, night sweats,  Fevers, chills, +fatigue, or  lassitude.  HEENT:   No headaches,  Difficulty swallowing,  Tooth/dental problems, or  Sore throat,                No sneezing, itching, ear ache, nasal congestion, post nasal drip,   CV:  No chest pain,  Orthopnea, PND, swelling in lower extremities, anasarca, dizziness, palpitations, syncope.   GI  No heartburn, indigestion, abdominal pain, nausea, vomiting, diarrhea, change in bowel habits, loss of appetite, bloody stools.   Resp: + shortness of breath with exertion less at rest.  + excess mucus, + productive cough,  No non-productive cough,  No coughing up of blood.  + change in color of mucus.  + wheezing.  No chest wall deformity  Skin: no rash or lesions.  GU: no dysuria, change in color of urine, no urgency or frequency.  No flank pain, no  hematuria   MS:  No joint pain or swelling.  + decreased range of motion.  No back pain.  Psych:  No change in mood or affect. No depression  or anxiety.  No memory loss.   Vital Signs Pulse 94   Ht 4\' 11"  (1.499 m)   Wt 126 lb (57.2 kg)   SpO2 94%   BMI 25.45 kg/m    Physical Exam:  General- No distress,  A&Ox3, pleasant ENT: No sinus tenderness, TM clear, pale nasal mucosa, no oral exudate,no post nasal drip, no LAN Cardiac: S1, S2, regular rate and rhythm, no murmur Chest: + wheeze/ No rales/ dullness; no accessory muscle use, no nasal flaring, no sternal retractions Abd.: Soft Non-tender,ND, BS +, Body mass index is 25.45 kg/m.  Ext: No clubbing cyanosis, edema Neuro:  physically deconditioned, MAE x 4, A&O x 3 Skin: No rashes, warm and dry, No lesions Psych: normal mood and behavior   Assessment/Plan Surveillance CT Chest concerning for recurrent NSCLC Biopsy 10 R node >> NO malignant cells Increased cough since procedure and increased WBC Plan Take Doxycycline until gone. Take 1 tablet twice daily Be mindful of the sun on this medication  Continue Mucinex  600 mg to help thin secretions/ Take with a full glass of water.  Continue rescue inhaler as needed for shortness of breath or wheezing up to 3 times daily. Your Biopsy was negative for malignant cells.  3 month follow up CT Chest as scheduled for 12/16/2021.  We will have you follow up with Dr. Earlie Server 12/18/2021 as is scheduled Follow up with [place] as needed.  Please contact office for sooner follow up if symptoms do not improve or worsen or seek emergency care    I spent 35 minutes dedicated to the care of this patient on the date of this encounter to include pre-visit review of records, face-to-face time with the patient discussing conditions above, post visit ordering of testing, clinical documentation with the electronic health record, making appropriate referrals as documented, and communicating necessary  information to the patient's healthcare team.   Magdalen Spatz, NP 10/23/2021  3:07 PM

## 2021-11-11 ENCOUNTER — Encounter: Payer: Medicare Other | Admitting: Physical Medicine and Rehabilitation

## 2021-11-15 ENCOUNTER — Other Ambulatory Visit: Payer: Self-pay | Admitting: Physical Medicine and Rehabilitation

## 2021-11-15 ENCOUNTER — Telehealth: Payer: Self-pay

## 2021-11-15 MED ORDER — GABAPENTIN 300 MG PO CAPS: 300.0000 mg | ORAL_CAPSULE | Freq: Three times a day (TID) | ORAL | 11 refills | Status: DC

## 2021-11-15 NOTE — Telephone Encounter (Signed)
Notified son [name] of medication change.

## 2021-11-15 NOTE — Telephone Encounter (Signed)
Patient called requesting increase in Gabapentin. Increase to 200 mg

## 2021-11-21 ENCOUNTER — Other Ambulatory Visit: Payer: Self-pay | Admitting: Internal Medicine

## 2021-11-21 DIAGNOSIS — M1612 Unilateral primary osteoarthritis, left hip: Secondary | ICD-10-CM

## 2021-11-21 DIAGNOSIS — G8929 Other chronic pain: Secondary | ICD-10-CM

## 2021-11-26 ENCOUNTER — Other Ambulatory Visit: Payer: Self-pay | Admitting: Internal Medicine

## 2021-11-26 DIAGNOSIS — F419 Anxiety disorder, unspecified: Secondary | ICD-10-CM

## 2021-11-26 DIAGNOSIS — J438 Other emphysema: Secondary | ICD-10-CM

## 2021-11-26 DIAGNOSIS — E440 Moderate protein-calorie malnutrition: Secondary | ICD-10-CM

## 2021-11-26 DIAGNOSIS — K219 Gastro-esophageal reflux disease without esophagitis: Secondary | ICD-10-CM

## 2021-11-26 DIAGNOSIS — I10 Essential (primary) hypertension: Secondary | ICD-10-CM

## 2021-11-26 DIAGNOSIS — E039 Hypothyroidism, unspecified: Secondary | ICD-10-CM

## 2021-11-26 DIAGNOSIS — J209 Acute bronchitis, unspecified: Secondary | ICD-10-CM

## 2021-11-26 DIAGNOSIS — N3281 Overactive bladder: Secondary | ICD-10-CM

## 2021-11-26 DIAGNOSIS — I2510 Atherosclerotic heart disease of native coronary artery without angina pectoris: Secondary | ICD-10-CM

## 2021-12-06 ENCOUNTER — Other Ambulatory Visit: Payer: Self-pay

## 2021-12-06 ENCOUNTER — Encounter
Payer: Medicare Other | Attending: Physical Medicine and Rehabilitation | Admitting: Physical Medicine and Rehabilitation

## 2021-12-06 ENCOUNTER — Encounter: Payer: Self-pay | Admitting: Physical Medicine and Rehabilitation

## 2021-12-06 VITALS — BP 159/76 | HR 77 | Temp 99.1°F | Ht 59.0 in | Wt 124.0 lb

## 2021-12-06 DIAGNOSIS — G4701 Insomnia due to medical condition: Secondary | ICD-10-CM | POA: Diagnosis not present

## 2021-12-06 DIAGNOSIS — Z9289 Personal history of other medical treatment: Secondary | ICD-10-CM | POA: Insufficient documentation

## 2021-12-06 DIAGNOSIS — M48061 Spinal stenosis, lumbar region without neurogenic claudication: Secondary | ICD-10-CM | POA: Insufficient documentation

## 2021-12-06 MED ORDER — AMITRIPTYLINE HCL 10 MG PO TABS: 10.0000 mg | ORAL_TABLET | Freq: Every day | ORAL | 1 refills | Status: DC

## 2021-12-06 MED ORDER — GABAPENTIN 400 MG PO CAPS: 400.0000 mg | ORAL_CAPSULE | Freq: Three times a day (TID) | ORAL | 3 refills | Status: DC

## 2021-12-06 NOTE — Progress Notes (Signed)
Subjective:    Patient ID: Heather [name], female    DOB: [birth year], 86 y.o.   MRN: [id number]  HPI   Mrs. Hechavarria is an 86 year old woman who presents for follow-up of of lumbar spinal stenosis and left hip bursitis  1) Lumbar spinal stenosis:  Her pain is present in bilateral feet and has been present since January. Average and current pain are currently rated as 8/10. The pain is constant and burning. Pain is worst with ambulation and relieved with rest. She also has left hip pain. She uses a cane and a walker and needs assistance with household activities.  -She has been taking oxycodone 5mg  and it does not touch the pain. Tramadol was not helping before.  -currently severe.  -aching in the low back.  -gabapentin is helping 300mg  TID.   2) Left hip pain: -this hurts her all the time.  -she had about 2 weeks of benefit from our last steroid injection -she asks about starting Darvocet which has benefited her in the past.  -hurting severely -she cannot bear weight -she has not tried Gabapentin before.   3) Constipation: -She has a bowel movement every other day -If she does not have this, she takes sennakot.  -this is still a big problem  4) Cancer:  -She feels a lot of fatigue -She has scans scheduled every 6 months.   5) Emesis: -since Friday  6) Insomnia: -Never sleeps until after midnight  He mentioned that Trazodone 150mg  I had prescribed in the past for insomnia was too much for her and sedated her. He says that she is currently taking Doxepin for sleep and she has been waking up after 2 hours after taking this. He requested that if she doesn't mention her sleep, not to prescribe anything. -she was up every two hours last night.  -she cannot sleep on her left side.   9) 9 teeth pulled: -worse than the back pain.    PDMP reviewed. She was last prescribed Lyrica 25mg  BID and Tramadol 50mg  BID on 8/02 and 7/29 respectively. Last scripts prior to that were 6  months ago. She asks whether the dosage of the Tramadol can be increased.   8/18 note with Dr. Scot Dock reviewed. During this in-office visit, she described burning pain and paresthesias in both feet. He felt her symptoms were most consistent with neuropathy. She has a history of back surgery and Dr. Scot Dock felt her pain could be referred from degenerative disc disease. ABIs were 100% on his testing.   Pain Inventory Average Pain 5 Pain Right Now 9 My pain is aching  In the last 24 hours, has pain interfered with the following? General activity 5 Relation with others 5 Enjoyment of life 5 What TIME of day is your pain at its worst? morning , daytime, evening, and night Sleep (in general) Poor  Pain is worse with: bending Pain improves with: medication Relief from Meds: 1  use a cane use a walker  I need assistance with the following:  household duties and shopping  dizziness anxiety  Colonoscopy      Any changes since last visit?  yes Primary care Dr.Jones    Family History  Problem Relation Age of Onset   Cancer Mother        Unknown type   Cancer Sister        Breast   Clotting disorder Neg Hx    Social History   Socioeconomic History   Marital status:  Widowed    Spouse name: Not on file   Number of children: 1   Years of education: Not on file   Highest education level: Not on file  Occupational History   Not on file  Tobacco Use   Smoking status: Former    Packs/day: 0.50    Types: Cigarettes    Start date: 07/16/1960    Quit date: 07/16/1998    Years since quitting: 23.4   Smokeless tobacco: Never  Vaping Use   Vaping Use: Never used  Substance and Sexual Activity   Alcohol use: No    Alcohol/week: 0.0 standard drinks   Drug use: No   Sexual activity: Never  Other Topics Concern   Not on file  Social History Narrative   Widowed.  Lives alone in her own home.  Ambulates with a cane/walker when needed.   Social Determinants of Health    Financial Resource Strain: Not on file  Food Insecurity: Not on file  Transportation Needs: Not on file  Physical Activity: Not on file  Stress: Not on file  Social Connections: Not on file   Past Surgical History:  Procedure Laterality Date   ABDOMINAL HYSTERECTOMY  age 81 or 9   APPENDECTOMY  yrs ago   BILATERAL SALPINGOOPHORECTOMY  age 43 or 67   COLONOSCOPY N/A 11/17/2013   Procedure: COLONOSCOPY;  Surgeon: Cleotis Nipper, MD;  Location: [facility];  Service: Endoscopy;  Laterality: N/A;   COLONOSCOPY N/A 07/16/2021   ESOPHAGOGASTRODUODENOSCOPY N/A 11/17/2013   Procedure: ESOPHAGOGASTRODUODENOSCOPY (EGD);  Surgeon: Cleotis Nipper, MD;  Location: [facility];  Service: Endoscopy;  Laterality: N/A;   FINE NEEDLE ASPIRATION  10/15/2021   Procedure: FINE NEEDLE ASPIRATION (FNA) LINEAR;  Surgeon: Garner Nash, DO;  Location: [facility];  Service: Pulmonary;;   HEMORRHOID SURGERY  yrs ago   TONSILLECTOMY  yrs ago   [place] Right 10/15/2021   Procedure: VIDEO BRONCHOSCOPY WITH ENDOBRONCHIAL ULTRASOUND;  Surgeon: Garner Nash, DO;  Location: [place];  Service: Pulmonary;  Laterality: Right;   Past Medical History:  Diagnosis Date   Anemia    ANXIETY 07/23/2009   Arthritis    Atherosclerosis of aorta ([place]) 29/92/4268   Complication of anesthesia 7 or 8 yrs ago   woke up during colonscopy   COPD (chronic obstructive pulmonary disease) ([place])    Coronary atherosclerosis 07/24/2015   DEPRESSION 07/23/2009   DIVERTICULOSIS, COLON 07/23/2009   Dyspnea    GERD 07/23/2009   Headache(784.0) 07/23/2009   occasional   Hemorrhoids    HIP PAIN, LEFT 05/16/2010   History of Crohn's disease    HYPERLIPIDEMIA 07/23/2009   HYPERTENSION 07/23/2009   HYPOTHYROIDISM 07/23/2009   IBS (irritable bowel syndrome)    Insomnia    LOW BACK PAIN, CHRONIC 10/01/2009   lung ca dx'd 2019   SBRT comp 12/2018   PARESTHESIA 10/01/2009    Shingles 2006   back   BP (!) 159/76    Pulse 77    Temp 99.1 F (37.3 C)    Ht 4\' 11"  (1.499 m)    Wt 124 lb (56.2 kg)    SpO2 93%    BMI 25.04 kg/m   Opioid Risk Score:   Fall Risk Score:  `1  Depression screen PHQ 2/9  Depression screen [facility] 2/9 12/06/2021 08/02/2021 06/24/2021 03/25/2021 07/27/2020 11/10/2019  Decreased Interest 0 1 0 0 0 3  Down, Depressed, Hopeless 0 1 1 0 0 3  PHQ -  2 Score 0 2 1 0 0 6  Altered sleeping - - - - - 3  Tired, decreased energy - - - - - 3  Change in appetite - - - - - 3  Feeling bad or failure about yourself  - - - - - 0  Trouble concentrating - - - - - 3  Moving slowly or fidgety/restless - - - - - 1  Suicidal thoughts - - - - - 0  PHQ-9 Score - - - - - 19  Difficult doing work/chores - - - - - Very difficult  Some recent data might be hidden   Review of Systems  Constitutional: Negative.   HENT:         Having problems after 9 teeth extracted  Eyes: Negative.   Respiratory: Negative.    Cardiovascular: Negative.   Gastrointestinal: Negative.   Endocrine: Negative.   Genitourinary: Negative.   Musculoskeletal:  Positive for arthralgias and myalgias.       Left hip pain , hurting all over body .   Allergic/Immunologic: Negative.   Neurological:        Burning type pain  Hematological: Negative.   Psychiatric/Behavioral:  The patient is nervous/anxious.   All other systems reviewed and are negative.     Objective:   Physical Exam Gen: no distress, normal appearing HEENT: oral mucosa pink and moist, NCAT Cardio: Reg rate Chest: normal effort, normal rate of breathing Abd: soft, non-distended Ext: no edema Psych: pleasant, normal affect Skin: intact Neuro: Alert and oriented x3 Musculoskeletal:  Wheelchair bound    Assessment & Plan:  Mrs. Flaming is an 86 year old woman who presents for follow-up of bilateral painful paraesthesias in her feet that started in January.  1) Chronic Pain Syndrome secondary to lumbar spinal  stenosis -Discussed following foods that may reduce pain: 1) Ginger (especially studied for arthritis)- reduce leukotriene production to decrease inflammation 2) Blueberries- high in phytonutrients that decrease inflammation 3) Salmon- marine omega-3s reduce joint swelling and pain 4) Pumpkin seeds- reduce inflammation 5) dark chocolate- reduces inflammation 6) turmeric- reduces inflammation 7) tart cherries - reduce pain and stiffness 8) extra virgin olive oil - its compound olecanthal helps to block prostaglandins  9) chili peppers- can be eaten or applied topically via capsaicin 10) mint- helpful for headache, muscle aches, joint pain, and itching 11) garlic- reduces inflammation  Link to further information on diet for chronic pain: [email or web address]   -Currently her pain is 8/10 and being managed with Lyrica 25mg  BID. Tramadol no longer effective. Being prescribed Oxycodone by Dr. Ronnald Ramp which provides some relief. Gabapentin is helpful, increase dose to 400mg  TID. Last refills provided in early August/late July (PDMP reviewed). Continue Tramadol three times per day. UDS obtained today and pain contract signed.   -MRI 01/15/2010 reviewed and shows mild disc bulging and facet hypertrophy at L4-L5 with resulting mild lateral recess stenosis bilaterally and asymmetric disc bulging on the left and bilateral face hypertrophy at L5-1 which is contributing to mild left foraminal stenosis without definite nerve root encroachment.   -Left hip XR from 02/03/20 reviewed and is normal.   2) Insomnia: -Her son reports insomnia. She was too drowsy with the Trazodone in the past. She is currently on Doxepin. She still wakes every 2 hours with this.  -melatonin did not help 5mg .  -recommended tart cherry juice -amitriptyline 10mg  prescribed. Advised that this can cause a lot of grogginess in the morning so we will start with the  lowest dose, please let me know in about 3 days if not effective enough. Advised that this medicine can also help with her neuropathic pain.  -She asks that I also send her Clorazepate 7.5mg  BID PRN for anxiety. She was on this dose prior to her doctor retiring and it really helped with her anxiety.    3) New-onset cancer: -answered her questions regarding the types of treatment she had -Recommended ketogenic diet -Discussed cancer associated fatigue -Discussed home therapy as an option if she would like  4) Left sided greater trochanteric bursitis: -discussed risks and benefits of steroid injection -apply blue emu oil Trochanteric bursa injection With or without ultrasound guidance -she was taken off the tramadol and started on oxycodone TID PRN which is not also helping much -start gabapentin 100mg  TID. Advised her to give me a call in 3 days if this in not effective.   Indication Trochanteric bursitis. Exam has tenderness over the greater trochanter of the hip. Pain has not responded to conservative care such as exercise therapy and oral medications. Pain interferes with sleep or with mobility Informed consent was obtained after describing risks and benefits of the procedure with the patient these include bleeding bruising and infection. Patient has signed written consent form. Patient placed in a lateral decubitus position with the affected hip superior. Point of maximal pain was palpated marked and prepped with Betadine and entered with a needle to bone contact. Needle slightly withdrawn then 6mg  of betamethasone with 4 cc 1% lidocaine were injected. Patient tolerated procedure well. Post procedure instructions given.   5) opioid induced constipation: -take senna nightly -discussed goal of daily BM -Provided list of following foods that help with constipation and highlighted a few: 1) prunes- contain high amounts of fiber.  2) apples- has a form of dietary fiber called pectin that  accelerates stool movement and increases beneficial gut bacteria 3) pears- in addition to fiber, also high in fructose and sorbitol which have laxative effect 4) figs- contain an enzyme ficin which helps to speed colonic transit 5) kiwis- contain an enzyme actinidin that improves gut motility and reduces constipation 6) oranges- rich in pectin (like apples) 7) grapefruits- contain a flavanol naringenin which has a laxative effect 8) vegetables- rich in fiber and also great sources of folate, vitamin C, and K 9) artichoke- high in inulin, prebiotic great for the microbiome 10) chicory- increases stool frequency and softness (can be added to coffee) 11) rhubarb- laxative effect 12) sweet potato- high fiber 13) beans, peas, and lentils- contain both soluble and insoluble fiber 14) chia seeds- improves intestinal health and gut flora 15) flaxseeds- laxative effect 16) whole grain rye bread- high in fiber 17) oat bran- high in soluble and insoluble fiber 18) kefir- softens stools -recommended to try at least one of these foods every day.  -drink 6-8 glasses of water per day -walk regularly, especially after meals.   6) Recent pulling of 9 teeth -mouth pain is currently her most severe pain -advised calling her dentist on Monday to let them know as she may require stronger pain medication from them temporarily. -burning electrical sensation suggestive of trigeminal neuralgia, advised hopefully temporary given recent surgery and that gabapentin and amitriptyline above should also help with this

## 2021-12-11 ENCOUNTER — Telehealth: Payer: Self-pay

## 2021-12-11 NOTE — Telephone Encounter (Signed)
Call back phone [phone]. Heather [name] just had a few teeth removed and now have gum sutures.  Her dentist could not write for pain medication. Will you please send in an RX? If granted please send [facility].

## 2021-12-12 ENCOUNTER — Other Ambulatory Visit: Payer: Self-pay | Admitting: Physical Medicine and Rehabilitation

## 2021-12-12 MED ORDER — GABAPENTIN 600 MG PO TABS: 600.0000 mg | ORAL_TABLET | Freq: Three times a day (TID) | ORAL | 3 refills | Status: DC

## 2021-12-12 NOTE — Telephone Encounter (Signed)
Heather [name] has been informed and aware of the new Rx for Gabapentin. She was not too happy with her son speaking for her.

## 2021-12-12 NOTE — Telephone Encounter (Signed)
Per Son the Gabapentin was increased on the last visit. They wanted to know if the dose of Oxycodone 5 MG can be increased. For a short amount of time until her gums heals?   Call back phone [phone].

## 2021-12-12 NOTE — Telephone Encounter (Signed)
Per Dr. Ranell Patrick the Gabapentin can be increased. Patient will need to contact Dr. Scarlette Calico for the Oxycodone 5 MG advice. Since he prescribed the medication.   Son aware of Dr. Aline August advice.   He would like Dr. Ranell Patrick to increase the Gabapentin. Please advise or send in the new Gabapentin dose.

## 2021-12-16 ENCOUNTER — Other Ambulatory Visit: Payer: Self-pay

## 2021-12-16 ENCOUNTER — Inpatient Hospital Stay: Payer: Medicare Other | Attending: Physician Assistant

## 2021-12-16 ENCOUNTER — Ambulatory Visit (HOSPITAL_COMMUNITY): Payer: Medicare Other

## 2021-12-16 ENCOUNTER — Ambulatory Visit (HOSPITAL_COMMUNITY)
Admission: RE | Admit: 2021-12-16 | Discharge: 2021-12-16 | Disposition: A | Discharge status: Home / Self Care | Payer: Medicare Other | Source: Ambulatory Visit | Attending: Internal Medicine | Admitting: Internal Medicine

## 2021-12-16 DIAGNOSIS — I700 Atherosclerosis of aorta: Secondary | ICD-10-CM | POA: Diagnosis not present

## 2021-12-16 DIAGNOSIS — J439 Emphysema, unspecified: Secondary | ICD-10-CM | POA: Diagnosis not present

## 2021-12-16 DIAGNOSIS — C3490 Malignant neoplasm of unspecified part of unspecified bronchus or lung: Secondary | ICD-10-CM

## 2021-12-16 LAB — CMP (CANCER CENTER ONLY)
ALT: 13 U/L (ref 0–44)
AST: 20 U/L (ref 15–41)
Albumin: 4.2 g/dL (ref 3.5–5.0)
Alkaline Phosphatase: 78 U/L (ref 38–126)
Anion gap: 6 (ref 5–15)
BUN: 9 mg/dL (ref 8–23)
CO2: 28 mmol/L (ref 22–32)
Calcium: 9.6 mg/dL (ref 8.9–10.3)
Chloride: 105 mmol/L (ref 98–111)
Creatinine: 0.74 mg/dL (ref 0.44–1.00)
GFR, Estimated: >60 mL/min (ref >60)
Glucose, Bld: 103 mg/dL — ABNORMAL HIGH (ref 70–99)
Potassium: 4.3 mmol/L (ref 3.5–5.1)
Sodium: 139 mmol/L (ref 135–145)
Total Bilirubin: 0.5 mg/dL (ref 0.3–1.2)
Total Protein: 7.3 g/dL (ref 6.5–8.1)

## 2021-12-16 LAB — CBC WITH DIFFERENTIAL (CANCER CENTER ONLY)
Abs Immature Granulocytes: 0.02 10*3/uL (ref 0.00–0.07)
Basophils Absolute: 0 10*3/uL (ref 0.0–0.1)
Basophils Relative: 0 %
Eosinophils Absolute: 0.1 10*3/uL (ref 0.0–0.5)
Eosinophils Relative: 1 %
HCT: 39.2 % (ref 36.0–46.0)
Hemoglobin: 12.7 g/dL (ref 12.0–15.0)
Immature Granulocytes: 0 %
Lymphocytes Relative: 18 %
Lymphs Abs: 1.2 10*3/uL (ref 0.7–4.0)
MCH: 30.1 pg (ref 26.0–34.0)
MCHC: 32.4 g/dL (ref 30.0–36.0)
MCV: 92.9 fL (ref 80.0–100.0)
Monocytes Absolute: 0.5 10*3/uL (ref 0.1–1.0)
Monocytes Relative: 7 %
Neutro Abs: 5 10*3/uL (ref 1.7–7.7)
Neutrophils Relative %: 74 %
Platelet Count: 283 10*3/uL (ref 150–400)
RBC: 4.22 MIL/uL (ref 3.87–5.11)
RDW: 13.7 % (ref 11.5–15.5)
WBC Count: 6.9 10*3/uL (ref 4.0–10.5)
nRBC: 0 % (ref 0.0–0.2)

## 2021-12-16 MED ORDER — IOHEXOL 350 MG/ML SOLN
60.0000 mL | Freq: Once | INTRAVENOUS | Status: AC | PRN
  Administered 2021-12-16: 60 mL via INTRAVENOUS

## 2021-12-16 MED ORDER — SODIUM CHLORIDE (PF) 0.9 % IJ SOLN
INTRAMUSCULAR | Status: AC
  Filled 2021-12-16: qty 50

## 2021-12-18 ENCOUNTER — Inpatient Hospital Stay: Payer: Medicare Other | Admitting: Internal Medicine

## 2022-01-02 ENCOUNTER — Other Ambulatory Visit: Payer: Self-pay | Admitting: Internal Medicine

## 2022-01-02 DIAGNOSIS — M1612 Unilateral primary osteoarthritis, left hip: Secondary | ICD-10-CM

## 2022-01-02 DIAGNOSIS — G4701 Insomnia due to medical condition: Secondary | ICD-10-CM

## 2022-01-10 ENCOUNTER — Other Ambulatory Visit: Payer: Self-pay | Admitting: Internal Medicine

## 2022-01-10 DIAGNOSIS — T402X5A Adverse effect of other opioids, initial encounter: Secondary | ICD-10-CM

## 2022-01-10 DIAGNOSIS — K5904 Chronic idiopathic constipation: Secondary | ICD-10-CM

## 2022-01-10 DIAGNOSIS — K5903 Drug induced constipation: Secondary | ICD-10-CM

## 2022-02-18 ENCOUNTER — Telehealth: Payer: Self-pay

## 2022-02-18 NOTE — Telephone Encounter (Signed)
approved through 08/21/2022 ?

## 2022-02-18 NOTE — Telephone Encounter (Signed)
Key: [id number] ?

## 2022-03-03 ENCOUNTER — Other Ambulatory Visit: Payer: Self-pay | Admitting: Physical Medicine and Rehabilitation

## 2022-03-04 ENCOUNTER — Other Ambulatory Visit: Payer: Self-pay | Admitting: Physical Medicine and Rehabilitation

## 2022-03-06 ENCOUNTER — Other Ambulatory Visit: Payer: Self-pay | Admitting: Internal Medicine

## 2022-03-06 DIAGNOSIS — E440 Moderate protein-calorie malnutrition: Secondary | ICD-10-CM

## 2022-03-06 DIAGNOSIS — F419 Anxiety disorder, unspecified: Secondary | ICD-10-CM

## 2022-03-06 DIAGNOSIS — J209 Acute bronchitis, unspecified: Secondary | ICD-10-CM

## 2022-03-06 DIAGNOSIS — J438 Other emphysema: Secondary | ICD-10-CM

## 2022-03-06 DIAGNOSIS — I10 Essential (primary) hypertension: Secondary | ICD-10-CM

## 2022-03-06 DIAGNOSIS — K219 Gastro-esophageal reflux disease without esophagitis: Secondary | ICD-10-CM

## 2022-03-06 DIAGNOSIS — I2510 Atherosclerotic heart disease of native coronary artery without angina pectoris: Secondary | ICD-10-CM

## 2022-03-06 DIAGNOSIS — N3281 Overactive bladder: Secondary | ICD-10-CM

## 2022-03-06 DIAGNOSIS — E039 Hypothyroidism, unspecified: Secondary | ICD-10-CM

## 2022-03-24 ENCOUNTER — Telehealth: Payer: Self-pay | Admitting: Internal Medicine

## 2022-03-24 NOTE — Telephone Encounter (Signed)
After a verbal discussion with PCP. PCP agrees that pt should go to the ED. ? ?I have spoke to the pt's son, [name], and he stated that pt "tends to over exaggerate to gets what she wants" and stated that "I believe she will be fine until the appointment tomorrow." He stated that he recently found out that she has been out of her anxiety medication for the last 2 weeks and believes if she had her meds that she would not be having these "episodes". ? ? ?

## 2022-03-24 NOTE — Telephone Encounter (Signed)
Connected to [facility] as pt son states pt is having SOB, tingling/ numbnes sin legs, and knots on stomach.  ? ?Appt scheduled with next available provider/ appointment time.  ?

## 2022-03-25 ENCOUNTER — Ambulatory Visit (INDEPENDENT_AMBULATORY_CARE_PROVIDER_SITE_OTHER): Payer: Medicare Other | Admitting: Emergency Medicine

## 2022-03-25 ENCOUNTER — Other Ambulatory Visit: Payer: Self-pay | Admitting: Internal Medicine

## 2022-03-25 ENCOUNTER — Encounter: Payer: Self-pay | Admitting: Emergency Medicine

## 2022-03-25 ENCOUNTER — Other Ambulatory Visit: Payer: Self-pay | Admitting: Physical Medicine and Rehabilitation

## 2022-03-25 ENCOUNTER — Telehealth: Payer: Self-pay | Admitting: *Deleted

## 2022-03-25 VITALS — BP 118/66 | HR 77 | Temp 98.5°F | Wt 125.0 lb

## 2022-03-25 DIAGNOSIS — J438 Other emphysema: Secondary | ICD-10-CM

## 2022-03-25 DIAGNOSIS — G8929 Other chronic pain: Secondary | ICD-10-CM

## 2022-03-25 DIAGNOSIS — M1612 Unilateral primary osteoarthritis, left hip: Secondary | ICD-10-CM

## 2022-03-25 DIAGNOSIS — K439 Ventral hernia without obstruction or gangrene: Secondary | ICD-10-CM

## 2022-03-25 DIAGNOSIS — G4701 Insomnia due to medical condition: Secondary | ICD-10-CM | POA: Diagnosis not present

## 2022-03-25 MED ORDER — PREGABALIN 25 MG PO CAPS: 25.0000 mg | ORAL_CAPSULE | Freq: Two times a day (BID) | ORAL | 1 refills | Status: DC

## 2022-03-25 MED ORDER — OXYCODONE HCL 5 MG PO TABS: ORAL_TABLET | ORAL | 0 refills | Status: DC

## 2022-03-25 MED ORDER — PROAIR RESPICLICK 108 (90 BASE) MCG/ACT IN AEPB: 2.0000 | INHALATION_SPRAY | Freq: Four times a day (QID) | RESPIRATORY_TRACT | 6 refills | Status: DC | PRN

## 2022-03-25 NOTE — Telephone Encounter (Signed)
Patient is requesting a refill of her oxycodone. Please advise  ?

## 2022-03-25 NOTE — Progress Notes (Signed)
GWENDA HEINER ?87 y.o. ? ? ?Chief Complaint  ?Patient presents with  ? Shortness of Breath  ?  X 2 days, SOB with activity    ? numbness and tingling  ?  Patient has not had her Lyrica and think that is the cause of her numbness and tingling in her legs   ? knot on stomach  ? ? ?HISTORY OF PRESENT ILLNESS: ?This is a 87 y.o. female patient of Dr. Scarlette Calico mostly complaining of numbness and tingling of her legs since stopping Lyrica.  Ran out of medication.  Needs medication refill. ?Also noted a "knot on my stomach" the last several weeks.  Still able to eat and drink.  Denies nausea or vomiting. ?Has history of COPD with occasional shortness of breath on exertion.  Much better now.  Denies chest pain or pressure. ?Uses Breo Ellipta and albuterol as rescue inhaler.  No flulike symptoms or productive cough. ?No other complaints or medical concerns today. ? ?HPI ? ? ?Prior to Admission medications   ?Medication Sig Start Date End Date Taking? Authorizing Provider  ?Albuterol Sulfate (PROAIR RESPICLICK) 811 (90 Base) MCG/ACT AEPB Inhale 2 puffs into the lungs 4 (four) times daily as needed. ?Patient taking differently: Inhale 2 puffs into the lungs 4 (four) times daily as needed (sob and wheezing). 01/26/18  Yes [name], MD  ?amitriptyline (ELAVIL) 10 MG tablet Take 1 tablet (10 mg total) by mouth at bedtime. 12/06/21  Yes Raulkar, Clide Deutscher, MD  ?[place]air Patter 200-25 MCG/ACT AEPB Inhale 1 puff into the lungs daily. 03/06/22  Yes [name], MD  ?clorazepate (TRANXENE) 7.5 MG tablet TAKE ONE TABLET BY MOUTH TWICE DAILY AS NEEDED FOR ANXIETY 03/04/22  Yes Raulkar, Clide Deutscher, MD  ?Dextromethorphan-guaiFENesin (MUCINEX DM) 30-600 MG TB12 Take 1 tablet by mouth 2 (two) times daily. 10/07/21  Yes Parrett, Tammy S, NP  ?docusate sodium (COLACE) 250 MG capsule Take 250 mg by mouth daily.   Yes [provider]  ?doxepin (SINEQUAN) 10 MG capsule TAKE ONE CAPSULE BY MOUTH AT BEDTIME AS NEEDED  03/06/22  Yes [name], MD  ?doxycycline (VIBRA-TABS) 100 MG tablet Take 1 tablet (100 mg total) by mouth 2 (two) times daily. 10/21/21  Yes Curt Bears, MD  ?esomeprazole (NEXIUM) 40 MG capsule TAKE ONE CAPSULE BY MOUTH DAILY 03/06/22  Yes [name], MD  ?folic acid (FOLVITE) 1 MG tablet TAKE ONE TABLET BY MOUTH DAILY 03/06/22  Yes [name], MD  ?furosemide (LASIX) 20 MG tablet TAKE 1 TABLET DAILY IF NEEDED FOR SIGNIFICANT LOWER EXTREMITY SWELLING. 01/19/19  Yes Marrian Salvage, FNP  ?gabapentin (NEURONTIN) 600 MG tablet Take 1 tablet (600 mg total) by mouth 3 (three) times daily. 12/12/21  Yes Raulkar, Clide Deutscher, MD  ?levothyroxine (SYNTHROID) 25 MCG tablet TAKE ONE TABLET BY MOUTH DAILY BEFORE BREAKFAST. 03/06/22  Yes [name], MD  ?losartan (COZAAR) 25 MG tablet TAKE ONE TABLET BY MOUTH DAILY HIGH BLOOD PRESSURE 03/06/22  Yes [name], MD  ?MYRBETRIQ 25 MG TB24 tablet TAKE 1 TABLET ONCE DAILY. 12/16/19  Yes Marrian Salvage, FNP  ?oxybutynin (DITROPAN-XL) 5 MG 24 hr tablet TAKE ONE TABLET BY MOUTH IN THE MORNING FOR BLADDER CONTROL 03/06/22  Yes [name], MD  ?oxyCODONE (OXY IR/ROXICODONE) 5 MG immediate release tablet TAKE ONE TABLET BY MOUTH EVERY 6 HOURS AS NEEDED FOR SEVERE PAIN 01/02/22  Yes [name], MD  ?polyvinyl alcohol (LIQUIFILM TEARS) 1.4 %  ophthalmic solution Place 1 drop into both eyes as needed for dry eyes.   Yes [provider]  ?potassium chloride SA (KLOR-CON M) 20 MEQ tablet TAKE ONE TABLET BY MOUTH DAILY 03/06/22  Yes [name], MD  ?pregabalin (LYRICA) 25 MG capsule TAKE (1) CAPSULE TWICE DAILY. 06/25/21  Yes Raulkar, Clide Deutscher, MD  ?promethazine (PHENERGAN) 25 MG tablet TAKE ONE TABLET BY MOUTH EVERY 8 HOURS AS NEEDED FOR NAUSEA AND VOMITING 10/17/21  Yes [name], MD  ?RELISTOR 150 MG TABS TAKE THREE TABLETS BY MOUTH DAILY 01/10/22  Yes [name], MD  ?sertraline (ZOLOFT) 50 MG tablet TAKE ONE TABLET BY MOUTH  EVERY DAY 03/06/22  Yes [name], MD  ?TRULANCE 3 MG TABS Take 1 tablet by mouth daily. 01/10/22  Yes [name], MD  ?vitamin B-12 (CYANOCOBALAMIN) 50 MCG tablet Take 50 mcg by mouth daily.   Yes [provider]  ?VITAMIN D-1000 MAX ST 25 MCG (1000 UT) tablet Take 1,000 Units by mouth daily. 02/24/20  Yes [provider]  ? ? ?Allergies  ?Allergen Reactions  ? Tylenol [Acetaminophen] Swelling and Other (See Comments)  ?  Nightmares, tongue swelling   ? ? ?Patient Active Problem List  ? Diagnosis Date Noted  ? Adenopathy 09/20/2021  ? Non-recurrent acute suppurative otitis media of both ears without spontaneous rupture of tympanic membranes 09/03/2021  ? Chronic idiopathic constipation 07/01/2021  ? Insomnia secondary to chronic pain 06/24/2021  ? Primary osteoarthritis of left hip 06/24/2021  ? Therapeutic opioid-induced constipation (OIC) 06/24/2021  ? Iron deficiency anemia 04/10/2021  ? Goals of care, counseling/discussion 10/10/2020  ? PHN (postherpetic neuralgia) 05/09/2020  ? Syncope and collapse 02/03/2020  ? Malignant neoplasm of lower lobe of right lung ([place]) 11/16/2018  ? Colitis 02/27/2018  ? Malnutrition of moderate degree 01/29/2018  ? COPD with acute exacerbation ([place]) 01/28/2018  ? 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 COPD exacerbation ([place]) 05/29/2016  ? Hypoxia 05/29/2016  ? Coronary atherosclerosis 07/24/2015  ? Atherosclerosis of aorta ([place]) 07/24/2015  ? Dysphagia 07/24/2015  ? Acute respiratory failure with hypoxia ([place]) 07/23/2015  ? Bronchospasm with bronchitis, acute 07/23/2015  ? LOW BACK PAIN, CHRONIC 10/01/2009  ? PARESTHESIA 10/01/2009  ? Hypothyroidism 07/23/2009  ? Anxiety and depression 07/23/2009  ? Essential hypertension 07/23/2009  ? GERD 07/23/2009  ? DIVERTICULOSIS, COLON 07/23/2009  ? ? ?Past Medical History:  ?Diagnosis Date  ? Anemia   ? ANXIETY 07/23/2009  ? Arthritis   ? Atherosclerosis of aorta ([place]) 07/24/2015  ? Complication of anesthesia 7 or  8 yrs ago  ? woke up during colonscopy  ? COPD (chronic obstructive pulmonary disease) ([place])   ? Coronary atherosclerosis 07/24/2015  ? DEPRESSION 07/23/2009  ? DIVERTICULOSIS, COLON 07/23/2009  ? Dyspnea   ? GERD 07/23/2009  ? Headache(784.0) 07/23/2009  ? occasional  ? Hemorrhoids   ? HIP PAIN, LEFT 05/16/2010  ? History of Crohn's disease   ? HYPERLIPIDEMIA 07/23/2009  ? HYPERTENSION 07/23/2009  ? HYPOTHYROIDISM 07/23/2009  ? IBS (irritable bowel syndrome)   ? Insomnia   ? LOW BACK PAIN, CHRONIC 10/01/2009  ? lung ca dx'd 2019  ? SBRT comp 12/2018  ? PARESTHESIA 10/01/2009  ? Shingles 2006  ? back  ? ? ?Past Surgical History:  ?Procedure Laterality Date  ? ABDOMINAL HYSTERECTOMY  age 73 or 30  ? APPENDECTOMY  yrs ago  ? BILATERAL SALPINGOOPHORECTOMY  age 89 or 39  ? COLONOSCOPY N/A 11/17/2013  ?  Procedure: COLONOSCOPY;  Surgeon: Cleotis Nipper, MD;  Location: [facility];  Service: Endoscopy;  Laterality: N/A;  ? COLONOSCOPY N/A 07/16/2021  ? ESOPHAGOGASTRODUODENOSCOPY N/A 11/17/2013  ? Procedure: ESOPHAGOGASTRODUODENOSCOPY (EGD);  Surgeon: Cleotis Nipper, MD;  Location: [facility];  Service: Endoscopy;  Laterality: N/A;  ? FINE NEEDLE ASPIRATION  10/15/2021  ? Procedure: FINE NEEDLE ASPIRATION (FNA) LINEAR;  Surgeon: Garner Nash, DO;  Location: [facility];  Service: Pulmonary;;  ? HEMORRHOID SURGERY  yrs ago  ? TONSILLECTOMY  yrs ago  ? VIDEO BRONCHOSCOPY WITH ENDOBRONCHIAL ULTRASOUND Right 10/15/2021  ? Procedure: VIDEO BRONCHOSCOPY WITH ENDOBRONCHIAL ULTRASOUND;  Surgeon: Garner Nash, DO;  Location: [place] [facility];  Service: Pulmonary;  Laterality: Right;  ? ? ?Social History  ? ?Socioeconomic History  ? Marital status: Widowed  ?  Spouse name: Not on file  ? Number of children: 1  ? Years of education: Not on file  ? Highest education level: Not on file  ?Occupational History  ? Not on file  ?Tobacco Use  ? Smoking status: Former  ?  Packs/day: 0.50  ?  Types: Cigarettes  ?  Start date: 07/16/1960   ?  Quit date: 07/16/1998  ?  Years since quitting: 23.7  ? Smokeless tobacco: Never  ?Vaping Use  ? Vaping Use: Never used  ?Substance and Sexual Activity  ? Alcohol use: No  ?  Alcohol/week: 0.0 standard

## 2022-03-25 NOTE — Patient Instructions (Signed)
Health Maintenance After Age 85 After age 85, you are at a higher risk for certain long-term diseases and infections as well as injuries from falls. Falls are a major cause of broken bones and head injuries in people who are older than age 85. Getting regular preventive care can help to keep you healthy and well. Preventive care includes getting regular testing and making lifestyle changes as recommended by your health care provider. Talk with your health care provider about: Which screenings and tests you should have. A screening is a test that checks for a disease when you have no symptoms. A diet and exercise plan that is right for you. What should I know about screenings and tests to prevent falls? Screening and testing are the best ways to find a health problem early. Early diagnosis and treatment give you the best chance of managing medical conditions that are common after age 85. Certain conditions and lifestyle choices may make you more likely to have a fall. Your health care provider may recommend: Regular vision checks. Poor vision and conditions such as cataracts can make you more likely to have a fall. If you wear glasses, make sure to get your prescription updated if your vision changes. Medicine review. Work with your health care provider to regularly review all of the medicines you are taking, including over-the-counter medicines. Ask your health care provider about any side effects that may make you more likely to have a fall. Tell your health care provider if any medicines that you take make you feel dizzy or sleepy. Strength and balance checks. Your health care provider may recommend certain tests to check your strength and balance while standing, walking, or changing positions. Foot health exam. Foot pain and numbness, as well as not wearing proper footwear, can make you more likely to have a fall. Screenings, including: Osteoporosis screening. Osteoporosis is a condition that causes  the bones to get weaker and break more easily. Blood pressure screening. Blood pressure changes and medicines to control blood pressure can make you feel dizzy. Depression screening. You may be more likely to have a fall if you have a fear of falling, feel depressed, or feel unable to do activities that you used to do. Alcohol use screening. Using too much alcohol can affect your balance and may make you more likely to have a fall. Follow these instructions at home: Lifestyle Do not drink alcohol if: Your health care provider tells you not to drink. If you drink alcohol: Limit how much you have to: 0-1 drink a day for women. 0-2 drinks a day for men. Know how much alcohol is in your drink. In the [place] one drink equals one 12 oz bottle of beer (355 mL), one 5 oz glass of wine (148 mL), or one 1 oz glass of hard liquor (44 mL). Do not use any products that contain nicotine or tobacco. These products include cigarettes, chewing tobacco, and vaping devices, such as e-cigarettes. If you need help quitting, ask your health care provider. Activity  Follow a regular exercise program to stay fit. This will help you maintain your balance. Ask your health care provider what types of exercise are appropriate for you. If you need a cane or walker, use it as recommended by your health care provider. Wear supportive shoes that have nonskid soles. Safety  Remove any tripping hazards, such as rugs, cords, and clutter. Install safety equipment such as grab bars in bathrooms and safety rails on stairs. Keep rooms and walkways   well-lit. General instructions Talk with your health care provider about your risks for falling. Tell your health care provider if: You fall. Be sure to tell your health care provider about all falls, even ones that seem minor. You feel dizzy, tiredness (fatigue), or off-balance. Take over-the-counter and prescription medicines only as told by your health care provider. These include  supplements. Eat a healthy diet and maintain a healthy weight. A healthy diet includes low-fat dairy products, low-fat (lean) meats, and fiber from whole grains, beans, and lots of fruits and vegetables. Stay current with your vaccines. Schedule regular health, dental, and eye exams. Summary Having a healthy lifestyle and getting preventive care can help to protect your health and wellness after age 85. Screening and testing are the best way to find a health problem early and help you avoid having a fall. Early diagnosis and treatment give you the best chance for managing medical conditions that are more common for people who are older than age 85. Falls are a major cause of broken bones and head injuries in people who are older than age 85. Take precautions to prevent a fall at home. Work with your health care provider to learn what changes you can make to improve your health and wellness and to prevent falls. This information is not intended to replace advice given to you by your health care provider. Make sure you discuss any questions you have with your health care provider. Document Revised: 04/15/2021 Document Reviewed: 04/15/2021 Elsevier Patient Education  2023 Elsevier Inc.  

## 2022-03-25 NOTE — Assessment & Plan Note (Signed)
Stable. Continue Breo-Ellipta and rescue inhaler, Albuterol as needed. ?

## 2022-03-27 ENCOUNTER — Telehealth: Payer: Self-pay

## 2022-03-27 NOTE — Telephone Encounter (Signed)
Caller aware Dr. Ranell Patrick is not in the office today: ? ?Heather [name]'s Clorazepate was not electronically prescribed in March 2023. The script went to print. Will you please resubmit it to [facility]? ? ?Call back phone [phone]. ?

## 2022-03-28 ENCOUNTER — Other Ambulatory Visit: Payer: Self-pay | Admitting: Physical Medicine and Rehabilitation

## 2022-03-28 MED ORDER — CLORAZEPATE DIPOTASSIUM 7.5 MG PO TABS: 7.5000 mg | ORAL_TABLET | Freq: Two times a day (BID) | ORAL | 3 refills | Status: DC | PRN

## 2022-04-15 ENCOUNTER — Telehealth: Payer: Self-pay | Admitting: Internal Medicine

## 2022-04-15 NOTE — Telephone Encounter (Signed)
Home Health nurse called and states pt is taking both gabapentin 600mg  and oxycodone 5mg , taking both 3 times a day and is still in exteremly high px (9 out of 10 on the px scale). Nurse is concerned about the amount of px the pt is reporting despite the amount medication the pt is taking.  ? ?Please call the pt to advise her on the best coursed of action.  ? ?Home #: [phone] ?

## 2022-04-28 ENCOUNTER — Other Ambulatory Visit: Payer: Self-pay | Admitting: Internal Medicine

## 2022-04-28 ENCOUNTER — Telehealth: Payer: Self-pay | Admitting: Internal Medicine

## 2022-04-28 DIAGNOSIS — M1612 Unilateral primary osteoarthritis, left hip: Secondary | ICD-10-CM

## 2022-04-28 DIAGNOSIS — G8929 Other chronic pain: Secondary | ICD-10-CM

## 2022-04-28 NOTE — Telephone Encounter (Signed)
Kim with [facility] calls today in regards to PT's recent refill request for the Oxycodone 5mg . They do not have the 5mg  on location on it's own. They do have the 5mg  with the acetaminophen though. They wanted to make sure this was okay with Dr.Jones before going forward.   Maudie Mercury stated that if it's fine we could just fax over that request and they'll get everything set.

## 2022-04-29 ENCOUNTER — Other Ambulatory Visit: Payer: Self-pay | Admitting: Internal Medicine

## 2022-04-29 DIAGNOSIS — G8929 Other chronic pain: Secondary | ICD-10-CM

## 2022-04-29 DIAGNOSIS — M1612 Unilateral primary osteoarthritis, left hip: Secondary | ICD-10-CM

## 2022-04-29 MED ORDER — OXYCODONE-ACETAMINOPHEN 5-325 MG PO TABS: 1.0000 | ORAL_TABLET | Freq: Three times a day (TID) | ORAL | 0 refills | Status: DC | PRN

## 2022-04-30 ENCOUNTER — Other Ambulatory Visit: Payer: Self-pay | Admitting: Internal Medicine

## 2022-04-30 DIAGNOSIS — M1612 Unilateral primary osteoarthritis, left hip: Secondary | ICD-10-CM

## 2022-04-30 DIAGNOSIS — G8929 Other chronic pain: Secondary | ICD-10-CM

## 2022-05-19 ENCOUNTER — Other Ambulatory Visit: Payer: Self-pay | Admitting: Physical Medicine and Rehabilitation

## 2022-05-29 ENCOUNTER — Other Ambulatory Visit: Payer: Self-pay | Admitting: Internal Medicine

## 2022-05-29 DIAGNOSIS — J209 Acute bronchitis, unspecified: Secondary | ICD-10-CM

## 2022-05-29 DIAGNOSIS — K219 Gastro-esophageal reflux disease without esophagitis: Secondary | ICD-10-CM

## 2022-05-29 DIAGNOSIS — H40013 Open angle with borderline findings, low risk, bilateral: Secondary | ICD-10-CM | POA: Diagnosis not present

## 2022-05-29 DIAGNOSIS — I10 Essential (primary) hypertension: Secondary | ICD-10-CM

## 2022-05-29 DIAGNOSIS — E440 Moderate protein-calorie malnutrition: Secondary | ICD-10-CM

## 2022-05-29 DIAGNOSIS — I2510 Atherosclerotic heart disease of native coronary artery without angina pectoris: Secondary | ICD-10-CM

## 2022-05-29 DIAGNOSIS — F32A Depression, unspecified: Secondary | ICD-10-CM

## 2022-05-29 DIAGNOSIS — E039 Hypothyroidism, unspecified: Secondary | ICD-10-CM

## 2022-05-29 DIAGNOSIS — N3281 Overactive bladder: Secondary | ICD-10-CM

## 2022-05-29 DIAGNOSIS — J438 Other emphysema: Secondary | ICD-10-CM

## 2022-06-05 ENCOUNTER — Other Ambulatory Visit: Payer: Self-pay | Admitting: Internal Medicine

## 2022-06-05 DIAGNOSIS — I10 Essential (primary) hypertension: Secondary | ICD-10-CM

## 2022-06-06 ENCOUNTER — Other Ambulatory Visit: Payer: Self-pay | Admitting: Internal Medicine

## 2022-06-06 DIAGNOSIS — K219 Gastro-esophageal reflux disease without esophagitis: Secondary | ICD-10-CM

## 2022-06-06 DIAGNOSIS — E039 Hypothyroidism, unspecified: Secondary | ICD-10-CM

## 2022-06-06 DIAGNOSIS — I2510 Atherosclerotic heart disease of native coronary artery without angina pectoris: Secondary | ICD-10-CM

## 2022-06-06 DIAGNOSIS — E440 Moderate protein-calorie malnutrition: Secondary | ICD-10-CM

## 2022-06-06 DIAGNOSIS — I10 Essential (primary) hypertension: Secondary | ICD-10-CM

## 2022-06-06 DIAGNOSIS — J209 Acute bronchitis, unspecified: Secondary | ICD-10-CM

## 2022-06-06 DIAGNOSIS — N3281 Overactive bladder: Secondary | ICD-10-CM

## 2022-06-06 DIAGNOSIS — J438 Other emphysema: Secondary | ICD-10-CM

## 2022-06-06 DIAGNOSIS — F32A Depression, unspecified: Secondary | ICD-10-CM

## 2022-06-18 ENCOUNTER — Other Ambulatory Visit: Payer: Self-pay | Admitting: Internal Medicine

## 2022-06-18 DIAGNOSIS — G8929 Other chronic pain: Secondary | ICD-10-CM

## 2022-06-18 DIAGNOSIS — M1612 Unilateral primary osteoarthritis, left hip: Secondary | ICD-10-CM

## 2022-06-24 ENCOUNTER — Encounter: Payer: Self-pay | Admitting: Physical Medicine and Rehabilitation

## 2022-06-24 ENCOUNTER — Encounter
Payer: Medicare Other | Attending: Physical Medicine and Rehabilitation | Admitting: Physical Medicine and Rehabilitation

## 2022-06-24 VITALS — BP 167/85 | HR 81 | Ht 59.0 in | Wt 130.8 lb

## 2022-06-24 DIAGNOSIS — M7062 Trochanteric bursitis, left hip: Secondary | ICD-10-CM | POA: Insufficient documentation

## 2022-06-24 DIAGNOSIS — M48061 Spinal stenosis, lumbar region without neurogenic claudication: Secondary | ICD-10-CM | POA: Diagnosis not present

## 2022-06-24 MED ORDER — PREGABALIN 25 MG PO CAPS
25.0000 mg | ORAL_CAPSULE | Freq: Two times a day (BID) | ORAL | 3 refills | Status: DC
Start: 2022-06-24 — End: 2022-12-30

## 2022-06-24 MED ORDER — CLORAZEPATE DIPOTASSIUM 7.5 MG PO TABS: 7.5000 mg | ORAL_TABLET | Freq: Two times a day (BID) | ORAL | 3 refills | Status: DC | PRN

## 2022-06-24 MED ORDER — GABAPENTIN 600 MG PO TABS: 600.0000 mg | ORAL_TABLET | Freq: Three times a day (TID) | ORAL | 3 refills | Status: DC

## 2022-06-24 NOTE — Progress Notes (Unsigned)
Trochanteric bursa injection   Indication Trochanteric bursitis. Exam has tenderness over the greater trochanter of the hip. Pain has not responded to conservative care such as exercise therapy and oral medications. Pain interferes with sleep or with mobility Informed consent was obtained after describing risks and benefits of the procedure with the patient these include bleeding bruising and infection. Patient has signed written consent form. Patient placed in a lateral decubitus position with the affected hip superior. Point of maximal pain was palpated marked and prepped with Betadine and entered with a needle to bone contact. Needle slightly withdrawn then 6mg  of betamethasone with 4 cc 1% lidocaine were injected. Patient tolerated procedure well. Post procedure instructions given.

## 2022-06-25 ENCOUNTER — Ambulatory Visit (INDEPENDENT_AMBULATORY_CARE_PROVIDER_SITE_OTHER): Payer: Medicare Other

## 2022-06-25 DIAGNOSIS — Z0000 Encounter for general adult medical examination without abnormal findings: Secondary | ICD-10-CM | POA: Diagnosis not present

## 2022-06-25 MED ORDER — BETAMETHASONE SOD PHOS & ACET 6 (3-3) MG/ML IJ SUSP: 6.0000 mg | Freq: Once | INTRAMUSCULAR | Status: DC

## 2022-06-25 NOTE — Progress Notes (Signed)
I connected with [name] today by telephone and verified that I am speaking with the correct person using two identifiers. Location patient: home Location provider: work Persons participating in the virtual visit: patient, provider.   I discussed the limitations, risks, security and privacy concerns of performing an evaluation and management service by telephone and the availability of in person appointments. I also discussed with the patient that there may be a patient responsible charge related to this service. The patient expressed understanding and verbally consented to this telephonic visit.    Interactive audio and video telecommunications were attempted between this provider and patient, however failed, due to patient having technical difficulties OR patient did not have access to video capability.  We continued and completed visit with audio only.  Some vital signs may be absent or patient reported.   Time Spent with patient on telephone encounter: 30 minutes  Subjective:   Heather [name] is a 87 y.o. female who presents for Medicare Annual (Subsequent) preventive examination.  Review of Systems     Cardiac Risk Factors include: advanced age (>12men, >26 women);hypertension     Objective:    There were no vitals filed for this visit. There is no height or weight on file to calculate BMI.     10/15/2021   12:21 PM 06/18/2021   10:56 AM 06/18/2021    8:06 AM 04/10/2021    2:14 PM 12/05/2020    9:46 AM 10/10/2020   11:18 AM 08/06/2020    8:28 AM  Advanced Directives  Does Patient Have a Medical Advance Directive? Yes Yes Yes Yes Yes Yes Yes  Type of Paramedic of [place];Living will Healthcare Power of Attorney Living will Healthcare Power of [place];Living will Healthcare Power of [place];Living will  Does patient want to make changes to medical advance directive?  No - Patient  declined  No - Patient declined  No - Patient declined   Copy of [place] in Chart?  Yes - validated most recent copy scanned in chart (See row information)  Yes - validated most recent copy scanned in chart (See row information)  Yes - validated most recent copy scanned in chart (See row information)     Current Medications (verified) Outpatient Encounter Medications as of 06/25/2022  Medication Sig   Albuterol Sulfate (PROAIR RESPICLICK) 165 (90 Base) MCG/ACT AEPB Inhale 2 puffs into the lungs 4 (four) times daily as needed.   BREO ELLIPTA 200-25 MCG/ACT AEPB Inhale 1 puff into the lungs daily.   clorazepate (TRANXENE) 7.5 MG tablet Take 1 tablet (7.5 mg total) by mouth 2 (two) times daily as needed for anxiety.   Dextromethorphan-guaiFENesin (MUCINEX DM) 30-600 MG TB12 Take 1 tablet by mouth 2 (two) times daily.   docusate sodium (COLACE) 250 MG capsule Take 250 mg by mouth daily.   doxepin (SINEQUAN) 10 MG capsule TAKE ONE CAPSULE BY MOUTH AT BEDTIME AS NEEDED   esomeprazole (NEXIUM) 40 MG capsule TAKE ONE CAPSULE BY MOUTH DAILY   folic acid (FOLVITE) 1 MG tablet TAKE ONE TABLET BY MOUTH DAILY   furosemide (LASIX) 20 MG tablet TAKE 1 TABLET DAILY IF NEEDED FOR SIGNIFICANT LOWER EXTREMITY SWELLING.   gabapentin (NEURONTIN) 600 MG tablet Take 1 tablet (600 mg total) by mouth 3 (three) times daily.   levothyroxine (SYNTHROID) 25 MCG tablet TAKE ONE TABLET BY MOUTH DAILY BEFORE BREAKFAST.   losartan (COZAAR) 25 MG tablet TAKE ONE TABLET  BY MOUTH DAILY HIGH BLOOD PRESSURE   oxybutynin (DITROPAN-XL) 5 MG 24 hr tablet TAKE ONE TABLET BY MOUTH IN THE MORNING FOR BLADDER CONTROL   oxyCODONE-acetaminophen (PERCOCET/ROXICET) 5-325 MG tablet TAKE ONE TABLET BY MOUTH EVERY 8 HOURS AS NEEDED for severe pain for up to 5 days   polyvinyl alcohol (LIQUIFILM TEARS) 1.4 % ophthalmic solution Place 1 drop into both eyes as needed for dry eyes.   potassium chloride SA (KLOR-CON M) 20 MEQ  tablet TAKE ONE TABLET BY MOUTH DAILY   pregabalin (LYRICA) 25 MG capsule Take 1 capsule (25 mg total) by mouth 2 (two) times daily.   promethazine (PHENERGAN) 25 MG tablet TAKE ONE TABLET BY MOUTH EVERY 8 HOURS AS NEEDED FOR NAUSEA AND VOMITING   RELISTOR 150 MG TABS TAKE THREE TABLETS BY MOUTH DAILY   sertraline (ZOLOFT) 50 MG tablet TAKE ONE TABLET BY MOUTH EVERY DAY   TRULANCE 3 MG TABS Take 1 tablet by mouth daily.   vitamin B-12 (CYANOCOBALAMIN) 50 MCG tablet Take 50 mcg by mouth daily.   VITAMIN D-1000 MAX ST 25 MCG (1000 UT) tablet Take 1,000 Units by mouth daily.   Facility-Administered Encounter Medications as of 06/25/2022  Medication   betamethasone acetate-betamethasone sodium phosphate (CELESTONE) injection 6 mg    Allergies (verified) Tylenol [acetaminophen]   History: Past Medical History:  Diagnosis Date   Anemia    ANXIETY 07/23/2009   Arthritis    Atherosclerosis of aorta ([place]) 25/36/6440   Complication of anesthesia 7 or 8 yrs ago   woke up during colonscopy   COPD (chronic obstructive pulmonary disease) ([place])    Coronary atherosclerosis 07/24/2015   DEPRESSION 07/23/2009   DIVERTICULOSIS, COLON 07/23/2009   Dyspnea    GERD 07/23/2009   Headache(784.0) 07/23/2009   occasional   Hemorrhoids    HIP PAIN, LEFT 05/16/2010   History of Crohn's disease    HYPERLIPIDEMIA 07/23/2009   HYPERTENSION 07/23/2009   HYPOTHYROIDISM 07/23/2009   IBS (irritable bowel syndrome)    Insomnia    LOW BACK PAIN, CHRONIC 10/01/2009   lung ca dx'd 2019   SBRT comp 12/2018   PARESTHESIA 10/01/2009   Shingles 2006   back   Past Surgical History:  Procedure Laterality Date   ABDOMINAL HYSTERECTOMY  age 77 or 16   APPENDECTOMY  yrs ago   BILATERAL SALPINGOOPHORECTOMY  age 35 or 70   COLONOSCOPY N/A 11/17/2013   Procedure: COLONOSCOPY;  Surgeon: Cleotis Nipper, MD;  Location: [facility];  Service: Endoscopy;  Laterality: N/A;   COLONOSCOPY N/A 07/16/2021    ESOPHAGOGASTRODUODENOSCOPY N/A 11/17/2013   Procedure: ESOPHAGOGASTRODUODENOSCOPY (EGD);  Surgeon: Cleotis Nipper, MD;  Location: [facility];  Service: Endoscopy;  Laterality: N/A;   FINE NEEDLE ASPIRATION  10/15/2021   Procedure: FINE NEEDLE ASPIRATION (FNA) LINEAR;  Surgeon: Garner Nash, DO;  Location: [facility];  Service: Pulmonary;;   HEMORRHOID SURGERY  yrs ago   TONSILLECTOMY  yrs ago   [place] Right 10/15/2021   Procedure: VIDEO BRONCHOSCOPY WITH ENDOBRONCHIAL ULTRASOUND;  Surgeon: Garner Nash, DO;  Location: [facility];  Service: Pulmonary;  Laterality: Right;   Family History  Problem Relation Age of Onset   Cancer Mother        Unknown type   Cancer Sister        Breast   Clotting disorder Neg Hx    Social History   Socioeconomic History   Marital status: Widowed    Spouse name:  Not on file   Number of children: 1   Years of education: Not on file   Highest education level: Not on file  Occupational History   Not on file  Tobacco Use   Smoking status: Former    Packs/day: 0.50    Types: Cigarettes    Start date: 07/16/1960    Quit date: 07/16/1998    Years since quitting: 23.9   Smokeless tobacco: Never  Vaping Use   Vaping Use: Never used  Substance and Sexual Activity   Alcohol use: No    Alcohol/week: 0.0 standard drinks of alcohol   Drug use: No   Sexual activity: Never  Other Topics Concern   Not on file  Social History Narrative   Widowed.  Lives alone in her own home.  Ambulates with a cane/walker when needed.   Social Determinants of Health   Financial Resource Strain: Low Risk  (06/25/2022)   Overall Financial Resource Strain (CARDIA)    Difficulty of Paying Living Expenses: Not hard at all  Food Insecurity: No Food Insecurity (06/25/2022)   Hunger Vital Sign    Worried About Running Out of Food in the Last Year: Never true    Ran Out of Food in the Last Year: Never true  Transportation  Needs: No Transportation Needs (06/25/2022)   PRAPARE - Hydrologist (Medical): No    Lack of Transportation (Non-Medical): No  Physical Activity: Inactive (06/25/2022)   Exercise Vital Sign    Days of Exercise per Week: 0 days    Minutes of Exercise per Session: 0 min  Stress: No Stress Concern Present (06/25/2022)   [place]    Feeling of Stress : Not at all  Social Connections: Socially Isolated (06/25/2022)   Social Connection and Isolation Panel [NHANES]    Frequency of Communication with Friends and Family: More than three times a week    Frequency of Social Gatherings with Friends and Family: More than three times a week    Attends Religious Services: Never    Marine scientist or Organizations: No    Attends Archivist Meetings: Never    Marital Status: Widowed    Tobacco Counseling Counseling given: Not Answered   Clinical Intake:  Pre-visit preparation completed: Yes  Pain : No/denies pain     BMI - recorded: 25.25 Nutritional Status: BMI 25 -29 Overweight Nutritional Risks: None Diabetes: No  How often do you need to have someone help you when you read instructions, pamphlets, or other written materials from your doctor or pharmacy?: 1 - Never What is the last grade level you completed in school?: HSG  Diabetic? no  Interpreter Needed?: No  Information entered by :: Lisette Abu, LPN.   Activities of Daily Living    06/25/2022    3:05 PM  In your present state of health, do you have any difficulty performing the following activities:  Hearing? 0  Vision? 0  Difficulty concentrating or making decisions? 0  Walking or climbing stairs? 1  Dressing or bathing? 0  Doing errands, shopping? 0  Preparing Food and eating ? N  Using the Toilet? N  In the past six months, have you accidently leaked urine? Y  Do you have problems with loss of bowel  control? N  Managing your Medications? N  Managing your Finances? N  Housekeeping or managing your Housekeeping? N    Patient Care Team: Scarlette Calico  L, MD as PCP - General (Internal Medicine) Laurin Coder, MD as Consulting Physician (Pulmonary Disease) Charlton Haws, [facility] [address] as Pharmacist (Pharmacist) Juluis Rainier as Consulting Physician (Optometry)  Indicate any recent Medical Services you may have received from other than [facility] providers in the past year (date may be approximate).     Assessment:   This is a routine wellness examination for Liane.  Hearing/Vision screen Hearing Screening - Comments:: Patient denied any hearing difficulty.   No hearing aids.  Vision Screening - Comments:: Patient does wear corrective lenses/contacts.  Eye exam done by: Alois Cliche, OD.    Dietary issues and exercise activities discussed: Current Exercise Habits: The patient does not participate in regular exercise at present, Exercise limited by: respiratory conditions(s);orthopedic condition(s)   Goals Addressed             This Visit's Progress    My goal is to stay independent.        Depression Screen    06/25/2022    3:04 PM 06/24/2022   11:41 AM 12/06/2021    2:05 PM 08/02/2021    3:34 PM 06/24/2021    2:21 PM 03/25/2021    1:53 PM 07/27/2020    9:56 AM  PHQ 2/9 Scores  PHQ - 2 Score 0 0 0 2 1 0 0    Fall Risk    06/25/2022    3:04 PM 06/24/2022   11:41 AM 12/06/2021    2:05 PM 08/02/2021    3:33 PM 06/24/2021    2:21 PM  [place] in the past year? 0 0 0 0 0  Number falls in past yr: 0   0 0  Injury with Fall? 0   0 0  Risk for fall due to : Impaired balance/gait Impaired balance/gait   Impaired balance/gait  Follow up Falls evaluation completed        FALL RISK PREVENTION PERTAINING TO THE HOME:  Any stairs in or around the home? No  If so, are there any without handrails? No  Home free of loose throw rugs in walkways, pet beds, electrical  cords, etc? Yes  Adequate lighting in your home to reduce risk of falls? Yes   ASSISTIVE DEVICES UTILIZED TO PREVENT FALLS:  Life alert? No  Use of a cane, walker or w/c? Yes  Grab bars in the bathroom? Yes  Shower chair or bench in shower? Yes  Elevated toilet seat or a handicapped toilet? Yes   TIMED UP AND GO:  Was the test performed? No .  Length of time to ambulate 10 feet: n/a sec.   Appearance of gait: Gait not evaluated during this visit.  Cognitive Function:    08/03/2018    1:46 PM  MMSE - Mini Mental State Exam  Not completed: Refused        06/25/2022    3:09 PM 07/31/2017    1:58 PM  6CIT Screen  What Year? 0 points 0 points  What month? 0 points 0 points  What time? 0 points 0 points  Count back from 20 0 points 0 points  Months in reverse 0 points 0 points  Repeat phrase 0 points 0 points  Total Score 0 points 0 points    Immunizations Immunization History  Administered Date(s) Administered   Fluad Quad(high Dose 65+) 09/19/2021   Influenza Split 09/22/2011, 09/22/2012   Influenza Whole 10/01/2009, 08/20/2010   Influenza, High Dose Seasonal PF 08/28/2015, 09/22/2016, 07/31/2017, 08/13/2018  Influenza,inj,Quad PF,6+ Mos 08/25/2014   Influenza-Unspecified 10/08/2013   PFIZER(Purple Top)SARS-COV-2 Vaccination 08/20/2020, 09/11/2020   Pneumococcal Conjugate-13 08/25/2014   Pneumococcal Polysaccharide-23 09/22/2011   Zoster, Live 11/15/2010    TDAP status: Due, Education has been provided regarding the importance of this vaccine. Advised may receive this vaccine at local pharmacy or Health Dept. Aware to provide a copy of the vaccination record if obtained from local pharmacy or Health Dept. Verbalized acceptance and understanding.  Flu Vaccine status: Up to date  Pneumococcal vaccine status: Up to date  Covid-19 vaccine status: Completed vaccines  Qualifies for Shingles Vaccine? Yes   Zostavax completed Yes   Shingrix Completed?: No.     Education has been provided regarding the importance of this vaccine. Patient has been advised to call insurance company to determine out of pocket expense if they have not yet received this vaccine. Advised may also receive vaccine at local pharmacy or Health Dept. Verbalized acceptance and understanding.  Screening Tests Health Maintenance  Topic Date Due   Zoster Vaccines- Shingrix (1 of 2) Never done   COVID-19 Vaccine (3 - Pfizer risk series) 10/09/2020   TETANUS/TDAP  08/27/2025 (Originally 06/18/1954)   INFLUENZA VACCINE  07/08/2022   Pneumonia Vaccine 16+ Years old  Completed   DEXA SCAN  Completed   HPV VACCINES  Aged Out    Health Maintenance  Health Maintenance Due  Topic Date Due   Zoster Vaccines- Shingrix (1 of 2) Never done   COVID-19 Vaccine (3 - Pfizer risk series) 10/09/2020    Colorectal cancer screening: No longer required.   Mammogram status: No longer required due to age.  Bone Density status: never done  Lung Cancer Screening: (Low Dose CT Chest recommended if Age 82-80 years, 30 pack-year currently smoking OR have quit w/in 15years.) does not qualify.   Lung Cancer Screening Referral: no  Additional Screening:  Hepatitis C Screening: does not qualify; Completed no  Vision Screening: Recommended annual ophthalmology exams for early detection of glaucoma and other disorders of the eye. Is the patient up to date with their annual eye exam?  Yes  Who is the provider or what is the name of the office in which the patient attends annual eye exams? Alois Cliche, OD. If pt is not established with a provider, would they like to be referred to a provider to establish care? No .   Dental Screening: Recommended annual dental exams for proper oral hygiene  Community Resource Referral / Chronic Care Management: CRR required this visit?  No   CCM required this visit?  No      Plan:     I have personally reviewed and noted the following in the patient's  chart:   Medical and social history Use of alcohol, tobacco or illicit drugs  Current medications and supplements including opioid prescriptions.  Functional ability and status Nutritional status Physical activity Advanced directives List of other physicians Hospitalizations, surgeries, and ER visits in previous 12 months Vitals Screenings to include cognitive, depression, and falls Referrals and appointments  In addition, I have reviewed and discussed with patient certain preventive protocols, quality metrics, and best practice recommendations. A written personalized care plan for preventive services as well as general preventive health recommendations were provided to patient.     Sheral Flow, LPN   2/42/3536   Nurse Notes:  There were no vitals filed for this visit. There is no height or weight on file to calculate BMI. Medications reviewed with patient; yes opioid  use noted.

## 2022-06-25 NOTE — Patient Instructions (Signed)
Heather [name] , Thank you for taking time to come for your Medicare Wellness Visit. I appreciate your ongoing commitment to your health goals. Please review the following plan we discussed and let me know if I can assist you in the future.   Screening recommendations/referrals: Colonoscopy: Discontinued Mammogram: Discontinued Bone Density: Discontinued Recommended yearly ophthalmology/optometry visit for glaucoma screening and checkup Recommended yearly dental visit for hygiene and checkup  Vaccinations: Influenza vaccine: 09/19/2021 Pneumococcal vaccine: 09/22/2011, 08/25/2014 Tdap vaccine: due Shingles vaccine: due   Covid-19: 08/20/2020, 09/11/2020  Advanced directives: Yes; DNR on file  Conditions/risks identified: Yes  Next appointment: Please schedule your next Medicare Wellness Visit with your Nurse Health Advisor in 1 year by calling [phone].   Preventive Care 85 Years and Older, Female Preventive care refers to lifestyle choices and visits with your health care provider that can promote health and wellness. What does preventive care include? A yearly physical exam. This is also called an annual well check. Dental exams once or twice a year. Routine eye exams. Ask your health care provider how often you should have your eyes checked. Personal lifestyle choices, including: Daily care of your teeth and gums. Regular physical activity. Eating a healthy diet. Avoiding tobacco and drug use. Limiting alcohol use. Practicing safe sex. Taking low-dose aspirin every day. Taking vitamin and mineral supplements as recommended by your health care provider. What happens during an annual well check? The services and screenings done by your health care provider during your annual well check will depend on your age, overall health, lifestyle risk factors, and family history of disease. Counseling  Your health care provider may ask you questions about your: Alcohol use. Tobacco  use. Drug use. Emotional well-being. Home and relationship well-being. Sexual activity. Eating habits. History of falls. Memory and ability to understand (cognition). Work and work Statistician. Reproductive health. Screening  You may have the following tests or measurements: Height, weight, and BMI. Blood pressure. Lipid and cholesterol levels. These may be checked every 5 years, or more frequently if you are over 43 years old. Skin check. Lung cancer screening. You may have this screening every year starting at age 22 if you have a 30-pack-year history of smoking and currently smoke or have quit within the past 15 years. Fecal occult blood test (FOBT) of the stool. You may have this test every year starting at age 72. Flexible sigmoidoscopy or colonoscopy. You may have a sigmoidoscopy every 5 years or a colonoscopy every 10 years starting at age 2. Hepatitis C blood test. Hepatitis B blood test. Sexually transmitted disease (STD) testing. Diabetes screening. This is done by checking your blood sugar (glucose) after you have not eaten for a while (fasting). You may have this done every 1-3 years. Bone density scan. This is done to screen for osteoporosis. You may have this done starting at age 28. Mammogram. This may be done every 1-2 years. Talk to your health care provider about how often you should have regular mammograms. Talk with your health care provider about your test results, treatment options, and if necessary, the need for more tests. Vaccines  Your health care provider may recommend certain vaccines, such as: Influenza vaccine. This is recommended every year. Tetanus, diphtheria, and acellular pertussis (Tdap, Td) vaccine. You may need a Td booster every 10 years. Zoster vaccine. You may need this after age 53. Pneumococcal 13-valent conjugate (PCV13) vaccine. One dose is recommended after age 62. Pneumococcal polysaccharide (PPSV23) vaccine. One dose is recommended  after  age 12. Talk to your health care provider about which screenings and vaccines you need and how often you need them. This information is not intended to replace advice given to you by your health care provider. Make sure you discuss any questions you have with your health care provider. Document Released: 12/21/2015 Document Revised: 08/13/2016 Document Reviewed: 09/25/2015 Elsevier Interactive Patient Education  2017 [place] Prevention in the Home Falls can cause injuries. They can happen to people of all ages. There are many things you can do to make your home safe and to help prevent falls. What can I do on the outside of my home? Regularly fix the edges of walkways and driveways and fix any cracks. Remove anything that might make you trip as you walk through a door, such as a raised step or threshold. Trim any bushes or trees on the path to your home. Use bright outdoor lighting. Clear any walking paths of anything that might make someone trip, such as rocks or tools. Regularly check to see if handrails are loose or broken. Make sure that both sides of any steps have handrails. Any raised decks and porches should have guardrails on the edges. Have any leaves, snow, or ice cleared regularly. Use sand or salt on walking paths during winter. Clean up any spills in your garage right away. This includes oil or grease spills. What can I do in the bathroom? Use night lights. Install grab bars by the toilet and in the tub and shower. Do not use towel bars as grab bars. Use non-skid mats or decals in the tub or shower. If you need to sit down in the shower, use a plastic, non-slip stool. Keep the floor dry. Clean up any water that spills on the floor as soon as it happens. Remove soap buildup in the tub or shower regularly. Attach bath mats securely with double-sided non-slip rug tape. Do not have throw rugs and other things on the floor that can make you trip. What can I do  in the bedroom? Use night lights. Make sure that you have a light by your bed that is easy to reach. Do not use any sheets or blankets that are too big for your bed. They should not hang down onto the floor. Have a firm chair that has side arms. You can use this for support while you get dressed. Do not have throw rugs and other things on the floor that can make you trip. What can I do in the kitchen? Clean up any spills right away. Avoid walking on wet floors. Keep items that you use a lot in easy-to-reach places. If you need to reach something above you, use a strong step stool that has a grab bar. Keep electrical cords out of the way. Do not use floor polish or wax that makes floors slippery. If you must use wax, use non-skid floor wax. Do not have throw rugs and other things on the floor that can make you trip. What can I do with my stairs? Do not leave any items on the stairs. Make sure that there are handrails on both sides of the stairs and use them. Fix handrails that are broken or loose. Make sure that handrails are as long as the stairways. Check any carpeting to make sure that it is firmly attached to the stairs. Fix any carpet that is loose or worn. Avoid having throw rugs at the top or bottom of the stairs. If you do have  throw rugs, attach them to the floor with carpet tape. Make sure that you have a light switch at the top of the stairs and the bottom of the stairs. If you do not have them, ask someone to add them for you. What else can I do to help prevent falls? Wear shoes that: Do not have high heels. Have rubber bottoms. Are comfortable and fit you well. Are closed at the toe. Do not wear sandals. If you use a stepladder: Make sure that it is fully opened. Do not climb a closed stepladder. Make sure that both sides of the stepladder are locked into place. Ask someone to hold it for you, if possible. Clearly mark and make sure that you can see: Any grab bars or  handrails. First and last steps. Where the edge of each step is. Use tools that help you move around (mobility aids) if they are needed. These include: Canes. Walkers. Scooters. Crutches. Turn on the lights when you go into a dark area. Replace any light bulbs as soon as they burn out. Set up your furniture so you have a clear path. Avoid moving your furniture around. If any of your floors are uneven, fix them. If there are any pets around you, be aware of where they are. Review your medicines with your doctor. Some medicines can make you feel dizzy. This can increase your chance of falling. Ask your doctor what other things that you can do to help prevent falls. This information is not intended to replace advice given to you by your health care provider. Make sure you discuss any questions you have with your health care provider. Document Released: 09/20/2009 Document Revised: 05/01/2016 Document Reviewed: 12/29/2014 Elsevier Interactive Patient Education  2017 [employer].

## 2022-06-26 ENCOUNTER — Telehealth: Payer: Self-pay

## 2022-06-26 ENCOUNTER — Other Ambulatory Visit: Payer: Self-pay | Admitting: Internal Medicine

## 2022-06-26 DIAGNOSIS — H40013 Open angle with borderline findings, low risk, bilateral: Secondary | ICD-10-CM | POA: Diagnosis not present

## 2022-06-26 NOTE — Telephone Encounter (Signed)
Pt stated that she has been using the restroom 3x a night. She stated that she would like her medication dose changed or be prescribed an alternative Rx for her urinary issues. Please advise.

## 2022-06-26 NOTE — Telephone Encounter (Signed)
Patient is requesting to speak with nurse regarding medication (Oxybutin).  CB# [phone]

## 2022-06-27 ENCOUNTER — Other Ambulatory Visit: Payer: Self-pay | Admitting: Internal Medicine

## 2022-06-27 DIAGNOSIS — N3281 Overactive bladder: Secondary | ICD-10-CM

## 2022-06-27 MED ORDER — MIRABEGRON ER 50 MG PO TB24: 50.0000 mg | ORAL_TABLET | Freq: Every day | ORAL | 1 refills | Status: DC

## 2022-06-27 NOTE — Telephone Encounter (Signed)
Pt has been informed alternative Rx has been sent.

## 2022-06-30 ENCOUNTER — Encounter: Payer: Self-pay | Admitting: Internal Medicine

## 2022-06-30 ENCOUNTER — Ambulatory Visit (INDEPENDENT_AMBULATORY_CARE_PROVIDER_SITE_OTHER): Payer: Medicare Other | Admitting: Internal Medicine

## 2022-06-30 VITALS — BP 116/64 | HR 95 | Temp 97.7°F | Ht 59.0 in | Wt 128.0 lb

## 2022-06-30 DIAGNOSIS — C3431 Malignant neoplasm of lower lobe, right bronchus or lung: Secondary | ICD-10-CM | POA: Diagnosis not present

## 2022-06-30 DIAGNOSIS — Z0001 Encounter for general adult medical examination with abnormal findings: Secondary | ICD-10-CM

## 2022-06-30 DIAGNOSIS — N184 Chronic kidney disease, stage 4 (severe): Secondary | ICD-10-CM | POA: Diagnosis not present

## 2022-06-30 DIAGNOSIS — E039 Hypothyroidism, unspecified: Secondary | ICD-10-CM | POA: Diagnosis not present

## 2022-06-30 DIAGNOSIS — G4701 Insomnia due to medical condition: Secondary | ICD-10-CM | POA: Diagnosis not present

## 2022-06-30 DIAGNOSIS — I10 Essential (primary) hypertension: Secondary | ICD-10-CM | POA: Diagnosis not present

## 2022-06-30 DIAGNOSIS — M1612 Unilateral primary osteoarthritis, left hip: Secondary | ICD-10-CM | POA: Diagnosis not present

## 2022-06-30 DIAGNOSIS — M545 Low back pain, unspecified: Secondary | ICD-10-CM

## 2022-06-30 DIAGNOSIS — K5903 Drug induced constipation: Secondary | ICD-10-CM

## 2022-06-30 DIAGNOSIS — Z23 Encounter for immunization: Secondary | ICD-10-CM

## 2022-06-30 DIAGNOSIS — I700 Atherosclerosis of aorta: Secondary | ICD-10-CM | POA: Diagnosis not present

## 2022-06-30 DIAGNOSIS — G8929 Other chronic pain: Secondary | ICD-10-CM | POA: Diagnosis not present

## 2022-06-30 DIAGNOSIS — K5904 Chronic idiopathic constipation: Secondary | ICD-10-CM | POA: Diagnosis not present

## 2022-06-30 DIAGNOSIS — Z515 Encounter for palliative care: Secondary | ICD-10-CM | POA: Diagnosis not present

## 2022-06-30 DIAGNOSIS — T402X5A Adverse effect of other opioids, initial encounter: Secondary | ICD-10-CM | POA: Diagnosis not present

## 2022-06-30 LAB — CBC WITH DIFFERENTIAL/PLATELET
Basophils Absolute: 0 10*3/uL (ref 0.0–0.1)
Basophils Relative: 0.4 % (ref 0.0–3.0)
Eosinophils Absolute: 0 10*3/uL (ref 0.0–0.7)
Eosinophils Relative: 0.4 % (ref 0.0–5.0)
HCT: 38.3 % (ref 36.0–46.0)
Hemoglobin: 13 g/dL (ref 12.0–15.0)
Lymphocytes Relative: 16.3 % (ref 12.0–46.0)
Lymphs Abs: 1.6 10*3/uL (ref 0.7–4.0)
MCHC: 33.8 g/dL (ref 30.0–36.0)
MCV: 92.2 fl (ref 78.0–100.0)
Monocytes Absolute: 1 10*3/uL (ref 0.1–1.0)
Monocytes Relative: 10.5 % (ref 3.0–12.0)
Neutro Abs: 7.2 10*3/uL (ref 1.4–7.7)
Neutrophils Relative %: 72.4 % (ref 43.0–77.0)
Platelets: 346 10*3/uL (ref 150.0–400.0)
RBC: 4.15 Mil/uL (ref 3.87–5.11)
RDW: 14.1 % (ref 11.5–15.5)
WBC: 10 10*3/uL (ref 4.0–10.5)

## 2022-06-30 LAB — BASIC METABOLIC PANEL
BUN: 16 mg/dL (ref 6–23)
CO2: 28 mEq/L (ref 19–32)
Calcium: 10 mg/dL (ref 8.4–10.5)
Chloride: 98 mEq/L (ref 96–112)
Creatinine, Ser: 1.36 mg/dL — ABNORMAL HIGH (ref 0.40–1.20)
GFR: 35.64 mL/min — ABNORMAL LOW (ref >60.00)
Glucose, Bld: 96 mg/dL (ref 70–99)
Potassium: 4.5 mEq/L (ref 3.5–5.1)
Sodium: 132 mEq/L — ABNORMAL LOW (ref 135–145)

## 2022-06-30 LAB — MAGNESIUM: Magnesium: 2 mg/dL (ref 1.5–2.5)

## 2022-06-30 LAB — TSH: TSH: 2 u[IU]/mL (ref 0.35–5.50)

## 2022-06-30 MED ORDER — SHINGRIX 50 MCG/0.5ML IM SUSR: 0.5000 mL | Freq: Once | INTRAMUSCULAR | 1 refills | Status: AC

## 2022-06-30 MED ORDER — OXYCODONE HCL 10 MG PO TABS: 10.0000 mg | ORAL_TABLET | Freq: Three times a day (TID) | ORAL | 0 refills | Status: DC | PRN

## 2022-06-30 MED ORDER — TRULANCE 3 MG PO TABS
1.0000 | ORAL_TABLET | Freq: Every day | ORAL | 1 refills | Status: DC
Start: 2022-06-30 — End: 2024-08-27

## 2022-06-30 MED ORDER — RELISTOR 150 MG PO TABS: 3.0000 | ORAL_TABLET | Freq: Every day | ORAL | 1 refills | Status: DC

## 2022-06-30 NOTE — Patient Instructions (Signed)

## 2022-06-30 NOTE — Progress Notes (Unsigned)
Subjective:  Patient ID: Heather [name], female    DOB: September 12, 1935  Age: 87 y.o. MRN: [id number]  CC: No chief complaint on file.   HPI KAYSI OURADA presents for ***  Outpatient Medications Prior to Visit  Medication Sig Dispense Refill   Albuterol Sulfate (PROAIR RESPICLICK) 323 (90 Base) MCG/ACT AEPB Inhale 2 puffs into the lungs 4 (four) times daily as needed. 1 each 6   BREO ELLIPTA 200-25 MCG/ACT AEPB Inhale 1 puff into the lungs daily. 180 each 0   clorazepate (TRANXENE) 7.5 MG tablet Take 1 tablet (7.5 mg total) by mouth 2 (two) times daily as needed for anxiety. 180 tablet 3   Dextromethorphan-guaiFENesin (MUCINEX DM) 30-600 MG TB12 Take 1 tablet by mouth 2 (two) times daily. 60 tablet 0   docusate sodium (COLACE) 250 MG capsule Take 250 mg by mouth daily.     doxepin (SINEQUAN) 10 MG capsule TAKE ONE CAPSULE BY MOUTH AT BEDTIME AS NEEDED 90 capsule 0   esomeprazole (NEXIUM) 40 MG capsule TAKE ONE CAPSULE BY MOUTH DAILY 90 capsule 0   folic acid (FOLVITE) 1 MG tablet TAKE ONE TABLET BY MOUTH DAILY 90 tablet 0   furosemide (LASIX) 20 MG tablet TAKE 1 TABLET DAILY IF NEEDED FOR SIGNIFICANT LOWER EXTREMITY SWELLING. 90 tablet 0   gabapentin (NEURONTIN) 600 MG tablet Take 1 tablet (600 mg total) by mouth 3 (three) times daily. 270 tablet 3   levothyroxine (SYNTHROID) 25 MCG tablet TAKE ONE TABLET BY MOUTH DAILY BEFORE BREAKFAST. 90 tablet 0   losartan (COZAAR) 25 MG tablet TAKE ONE TABLET BY MOUTH DAILY HIGH BLOOD PRESSURE 90 tablet 0   mirabegron ER (MYRBETRIQ) 50 MG TB24 tablet Take 1 tablet (50 mg total) by mouth daily. 90 tablet 1   oxybutynin (DITROPAN-XL) 5 MG 24 hr tablet TAKE ONE TABLET BY MOUTH IN THE MORNING FOR BLADDER CONTROL 90 tablet 0   polyvinyl alcohol (LIQUIFILM TEARS) 1.4 % ophthalmic solution Place 1 drop into both eyes as needed for dry eyes.     potassium chloride SA (KLOR-CON M) 20 MEQ tablet TAKE ONE TABLET BY MOUTH DAILY 90 tablet 0   pregabalin  (LYRICA) 25 MG capsule Take 1 capsule (25 mg total) by mouth 2 (two) times daily. 180 capsule 3   promethazine (PHENERGAN) 25 MG tablet TAKE ONE TABLET BY MOUTH EVERY 8 HOURS AS NEEDED FOR NAUSEA AND VOMITING 30 tablet 1   sertraline (ZOLOFT) 50 MG tablet TAKE ONE TABLET BY MOUTH EVERY DAY 90 tablet 0   vitamin B-12 (CYANOCOBALAMIN) 50 MCG tablet Take 50 mcg by mouth daily.     VITAMIN D-1000 MAX ST 25 MCG (1000 UT) tablet Take 1,000 Units by mouth daily.     oxyCODONE-acetaminophen (PERCOCET/ROXICET) 5-325 MG tablet TAKE ONE TABLET BY MOUTH EVERY 8 HOURS AS NEEDED for severe pain for up to 5 days 90 tablet 0   RELISTOR 150 MG TABS TAKE THREE TABLETS BY MOUTH DAILY 270 tablet 1   TRULANCE 3 MG TABS Take 1 tablet by mouth daily. 90 tablet 1   betamethasone acetate-betamethasone sodium phosphate (CELESTONE) injection 6 mg      No facility-administered medications prior to visit.    ROS Review of Systems  Objective:  BP 116/64 (BP Location: Left Arm, Patient Position: Sitting, Cuff Size: Large)   Pulse 95   Temp 97.7 F (36.5 C) (Oral)   Ht 4\' 11"  (1.499 m)   Wt 128 lb (58.1 kg)   SpO2 94%  BMI 25.85 kg/m   BP Readings from Last 3 Encounters:  06/30/22 116/64  06/24/22 (!) 167/85  03/25/22 118/66    Wt Readings from Last 3 Encounters:  06/30/22 128 lb (58.1 kg)  06/24/22 130 lb 12.8 oz (59.3 kg)  03/25/22 125 lb (56.7 kg)    Physical Exam  Lab Results  Component Value Date   WBC 10.0 06/30/2022   HGB 13.0 06/30/2022   HCT 38.3 06/30/2022   PLT 346.0 06/30/2022   GLUCOSE 96 06/30/2022   CHOL 234 (H) 01/26/2018   TRIG 112.0 01/26/2018   HDL 81.90 01/26/2018   LDLDIRECT 118.2 03/29/2013   LDLCALC 130 (H) 01/26/2018   ALT 13 12/16/2021   AST 20 12/16/2021   NA 132 (L) 06/30/2022   K 4.5 06/30/2022   CL 98 06/30/2022   CREATININE 1.36 (H) 06/30/2022   BUN 16 06/30/2022   CO2 28 06/30/2022   TSH 2.00 06/30/2022   HGBA1C 5.7 06/21/2019    CT Chest W  Contrast  Result Date: 12/17/2021 CLINICAL DATA:  Non-small cell lung cancer, staging EXAM: CT CHEST WITH CONTRAST TECHNIQUE: Multidetector CT imaging of the chest was performed during intravenous contrast administration. CONTRAST:  11mL OMNIPAQUE IOHEXOL 350 MG/ML SOLN COMPARISON:  Multiple priors, most recent CT chest with contrast dated September 17, 2021 FINDINGS: Cardiovascular: Normal heart size. No pericardial effusion. Three-vessel coronary artery calcifications. Atherosclerotic disease of the thoracic aorta. Mediastinum/Nodes: No pathologically enlarged lymph nodes seen in the chest. Small hiatal hernia. Lungs/Pleura: Central airways are patent. Centrilobular emphysema. Right lower lobe linear opacity, compatible with post treatment changes. Area of suspicious right infrahilar soft tissue is unchanged when compared with most recent prior exam but has increased when compared with more remote priors. For example, soft tissue thickening located anterior to the right mainstem bronchus measures up to 6 mm, unchanged compared to most recent prior dated September 17, 2021, new when compared with June 14, 2021 prior. No new or enlarging pulmonary nodules. Upper Abdomen: Partially visualized upper abdomen is unremarkable. Musculoskeletal: Prior vertebral augmentation of the L2 vertebral body. No aggressive appearing osseous lesions. IMPRESSION: 1. Right lower lobe linear opacity, compatible with post treatment changes. Area of adjacent suspicious right infrahilar soft tissue is unchanged when compared with most recent prior exam but has increased when compared with more remote priors. Recommend continued follow-up or further evaluation with PET-CT. 2. Coronary artery calcifications, aortic Atherosclerosis (ICD10-I70.0) and Emphysema (ICD10-J43.9). Electronically Signed   By: Yetta Glassman M.D.   On: 12/17/2021 10:33    Assessment & Plan:   Diagnoses and all orders for this visit:  Encounter for general adult  medical examination with abnormal findings  Chronic idiopathic constipation -     Plecanatide (TRULANCE) 3 MG TABS; Take 1 tablet by mouth daily. -     Basic metabolic panel; Future -     TSH; Future -     Magnesium; Future -     Magnesium -     TSH -     Basic metabolic panel  Therapeutic opioid-induced constipation (OIC) -     Methylnaltrexone Bromide (RELISTOR) 150 MG TABS; Take 3 tablets by mouth daily.  Essential hypertension -     Basic metabolic panel; Future -     TSH; Future -     Magnesium; Future -     CBC with Differential/Platelet; Future -     CBC with Differential/Platelet -     Magnesium -     TSH -  Basic metabolic panel  Atherosclerosis of aorta (HCC)  Hypothyroidism, unspecified type -     TSH; Future -     CBC with Differential/Platelet; Future -     CBC with Differential/Platelet -     TSH  Chronic low back pain without sciatica, unspecified back pain laterality -     Oxycodone HCl 10 MG TABS; Take 1 tablet (10 mg total) by mouth every 8 (eight) hours as needed.  Insomnia secondary to chronic pain -     Oxycodone HCl 10 MG TABS; Take 1 tablet (10 mg total) by mouth every 8 (eight) hours as needed.  Primary osteoarthritis of left hip -     Oxycodone HCl 10 MG TABS; Take 1 tablet (10 mg total) by mouth every 8 (eight) hours as needed.  Encounter for palliative care involving management of pain -     Oxycodone HCl 10 MG TABS; Take 1 tablet (10 mg total) by mouth every 8 (eight) hours as needed.  Need for prophylactic vaccination and inoculation against varicella -     Zoster Vaccine Adjuvanted [facility]) injection; Inject 0.5 mLs into the muscle once for 1 dose.  Chronic renal disease, stage 4, severely decreased glomerular filtration rate (GFR) between 15-29 mL/min/1.73 square meter (HCC)  Malignant neoplasm of lower lobe of right lung (HCC)  Other orders -     Pneumococcal polysaccharide vaccine 23-valent greater than or equal to 2yo  subcutaneous/IM   I have discontinued Matilde Sprang. Zingg "Gerri"'s oxyCODONE-acetaminophen. I have also changed her Trulance and Relistor. Additionally, I am having her start on Oxycodone HCl and Shingrix. Lastly, I am having her maintain her furosemide, Vitamin D-1000 Max St, docusate sodium, vitamin B-12, polyvinyl alcohol, Mucinex DM, ProAir RespiClick, promethazine, losartan, levothyroxine, esomeprazole, doxepin, Breo Ellipta, folic acid, potassium chloride SA, sertraline, oxybutynin, pregabalin, gabapentin, clorazepate, and mirabegron ER. We will stop administering betamethasone acetate-betamethasone sodium phosphate.  Meds ordered this encounter  Medications   Plecanatide (TRULANCE) 3 MG TABS    Sig: Take 1 tablet by mouth daily.    Dispense:  90 tablet    Refill:  1   Methylnaltrexone Bromide (RELISTOR) 150 MG TABS    Sig: Take 3 tablets by mouth daily.    Dispense:  270 tablet    Refill:  1   Oxycodone HCl 10 MG TABS    Sig: Take 1 tablet (10 mg total) by mouth every 8 (eight) hours as needed.    Dispense:  90 tablet    Refill:  0   Zoster Vaccine Adjuvanted [facility]) injection    Sig: Inject 0.5 mLs into the muscle once for 1 dose.    Dispense:  0.5 mL    Refill:  1     Follow-up: Return in about 6 months (around 12/31/2022).  Scarlette Calico, MD

## 2022-07-01 DIAGNOSIS — Z23 Encounter for immunization: Secondary | ICD-10-CM | POA: Insufficient documentation

## 2022-07-02 ENCOUNTER — Encounter: Payer: Self-pay | Admitting: Internal Medicine

## 2022-07-22 ENCOUNTER — Ambulatory Visit (INDEPENDENT_AMBULATORY_CARE_PROVIDER_SITE_OTHER): Payer: Medicare Other | Admitting: Emergency Medicine

## 2022-07-22 ENCOUNTER — Encounter: Payer: Self-pay | Admitting: Emergency Medicine

## 2022-07-22 ENCOUNTER — Ambulatory Visit (INDEPENDENT_AMBULATORY_CARE_PROVIDER_SITE_OTHER): Payer: Medicare Other

## 2022-07-22 VITALS — BP 118/64 | HR 101 | Temp 98.3°F | Ht 59.0 in

## 2022-07-22 DIAGNOSIS — I10 Essential (primary) hypertension: Secondary | ICD-10-CM

## 2022-07-22 DIAGNOSIS — J189 Pneumonia, unspecified organism: Secondary | ICD-10-CM | POA: Diagnosis not present

## 2022-07-22 DIAGNOSIS — R079 Chest pain, unspecified: Secondary | ICD-10-CM | POA: Diagnosis not present

## 2022-07-22 DIAGNOSIS — R059 Cough, unspecified: Secondary | ICD-10-CM | POA: Diagnosis not present

## 2022-07-22 DIAGNOSIS — J449 Chronic obstructive pulmonary disease, unspecified: Secondary | ICD-10-CM | POA: Diagnosis not present

## 2022-07-22 DIAGNOSIS — N3281 Overactive bladder: Secondary | ICD-10-CM | POA: Diagnosis not present

## 2022-07-22 MED ORDER — AZITHROMYCIN 250 MG PO TABS: ORAL_TABLET | ORAL | 0 refills | Status: DC

## 2022-07-22 NOTE — Progress Notes (Signed)
Heather [name] 87 y.o.   Chief Complaint  Patient presents with   Cough   pain under right breast     While cough ing pain gets worse    HISTORY OF PRESENT ILLNESS: This is a 87 y.o. female complaining of productive cough and right pleuritic chest pain since last Friday. Has history of COPD.  Denies fever or chills or more shortness of breath than usual. No other associated symptoms. Also complaining of urinary frequency. Had 2 urinary accidents in the past couple of weeks when she could not hold her urine. No other complaints or medical concerns today.  Cough Associated symptoms include chest pain. Pertinent negatives include no chills, fever, headaches, hemoptysis, rash, sore throat, shortness of breath or wheezing.     Prior to Admission medications   Medication Sig Start Date End Date Taking? Authorizing Provider  Albuterol Sulfate (PROAIR RESPICLICK) 563 (90 Base) MCG/ACT AEPB Inhale 2 puffs into the lungs 4 (four) times daily as needed. 03/25/22  Yes Rakin Lemelle, Ines Bloomer, MD  BREO ELLIPTA 200-25 MCG/ACT AEPB Inhale 1 puff into the lungs daily. 06/06/22  Yes [name], MD  clorazepate (TRANXENE) 7.5 MG tablet Take 1 tablet (7.5 mg total) by mouth 2 (two) times daily as needed for anxiety. 06/24/22  Yes Raulkar, Clide Deutscher, MD  Dextromethorphan-guaiFENesin (MUCINEX DM) 30-600 MG TB12 Take 1 tablet by mouth 2 (two) times daily. 10/07/21  Yes Parrett, Tammy S, NP  docusate sodium (COLACE) 250 MG capsule Take 250 mg by mouth daily.   Yes [provider]  doxepin (SINEQUAN) 10 MG capsule TAKE ONE CAPSULE BY MOUTH AT BEDTIME AS NEEDED 06/06/22  Yes [name], MD  esomeprazole (NEXIUM) 40 MG capsule TAKE ONE CAPSULE BY MOUTH DAILY 06/06/22  Yes [name], MD  folic acid (FOLVITE) 1 MG tablet TAKE ONE TABLET BY MOUTH DAILY 06/06/22  Yes [name], MD  furosemide (LASIX) 20 MG tablet TAKE 1 TABLET DAILY IF NEEDED FOR SIGNIFICANT LOWER EXTREMITY SWELLING.  01/19/19  Yes Marrian Salvage, FNP  gabapentin (NEURONTIN) 600 MG tablet Take 1 tablet (600 mg total) by mouth 3 (three) times daily. 06/24/22  Yes Raulkar, Clide Deutscher, MD  levothyroxine (SYNTHROID) 25 MCG tablet TAKE ONE TABLET BY MOUTH DAILY BEFORE BREAKFAST. 06/06/22  Yes [name], MD  losartan (COZAAR) 25 MG tablet TAKE ONE TABLET BY MOUTH DAILY HIGH BLOOD PRESSURE 06/05/22  Yes [name], MD  Methylnaltrexone Bromide (RELISTOR) 150 MG TABS Take 3 tablets by mouth daily. 06/30/22  Yes [name], MD  mirabegron ER (MYRBETRIQ) 50 MG TB24 tablet Take 1 tablet (50 mg total) by mouth daily. 06/27/22  Yes [name], MD  oxybutynin (DITROPAN-XL) 5 MG 24 hr tablet TAKE ONE TABLET BY MOUTH IN THE MORNING FOR BLADDER CONTROL 06/06/22  Yes [name], MD  Oxycodone HCl 10 MG TABS Take 1 tablet (10 mg total) by mouth every 8 (eight) hours as needed. 06/30/22  Yes [name], MD  Plecanatide (TRULANCE) 3 MG TABS Take 1 tablet by mouth daily. 06/30/22  Yes [name], MD  polyvinyl alcohol (LIQUIFILM TEARS) 1.4 % ophthalmic solution Place 1 drop into both eyes as needed for dry eyes.   Yes [provider]  potassium chloride SA (KLOR-CON M) 20 MEQ tablet TAKE ONE TABLET BY MOUTH DAILY 06/06/22  Yes [name], MD  pregabalin (LYRICA) 25 MG capsule Take 1 capsule (25 mg total) by mouth  2 (two) times daily. 06/24/22  Yes Raulkar, Clide Deutscher, MD  promethazine (PHENERGAN) 25 MG tablet TAKE ONE TABLET BY MOUTH EVERY 8 HOURS AS NEEDED FOR NAUSEA AND VOMITING 04/28/22  Yes [name], MD  sertraline (ZOLOFT) 50 MG tablet TAKE ONE TABLET BY MOUTH EVERY DAY 06/06/22  Yes [name], MD  vitamin B-12 (CYANOCOBALAMIN) 50 MCG tablet Take 50 mcg by mouth daily.   Yes [provider]  VITAMIN D-1000 MAX ST 25 MCG (1000 UT) tablet Take 1,000 Units by mouth daily. 02/24/20  Yes [provider]    Allergies  Allergen Reactions   Tylenol  [Acetaminophen] Swelling and Other (See Comments)    Nightmares, tongue swelling     Patient Active Problem List   Diagnosis Date Noted   Need for prophylactic vaccination and inoculation against varicella 07/01/2022   Encounter for general adult medical examination with abnormal findings 06/30/2022   Encounter for palliative care involving management of pain 06/30/2022   OAB (overactive bladder) 06/27/2022   Ventral hernia without obstruction or gangrene 03/25/2022   Chronic idiopathic constipation 07/01/2021   Insomnia secondary to chronic pain 06/24/2021   Primary osteoarthritis of left hip 06/24/2021   Therapeutic opioid-induced constipation (OIC) 06/24/2021   Iron deficiency anemia 04/10/2021   Goals of care, counseling/discussion 10/10/2020   PHN (postherpetic neuralgia) 05/09/2020   Malignant neoplasm of lower lobe of right lung ([place]) 11/16/2018   Malnutrition of moderate degree 01/29/2018   Other emphysema ([place]) 06/26/2017   Coronary atherosclerosis 07/24/2015   Atherosclerosis of aorta ([place]) 07/24/2015   Dysphagia 07/24/2015   LOW BACK PAIN, CHRONIC 10/01/2009   Hypothyroidism 07/23/2009   Anxiety and depression 07/23/2009   Essential hypertension 07/23/2009   GERD 07/23/2009    Past Medical History:  Diagnosis Date   Anemia    ANXIETY 07/23/2009   Arthritis    Atherosclerosis of aorta ([place]) 14/48/1856   Complication of anesthesia 7 or 8 yrs ago   woke up during colonscopy   COPD (chronic obstructive pulmonary disease) ([place])    Coronary atherosclerosis 07/24/2015   DEPRESSION 07/23/2009   DIVERTICULOSIS, COLON 07/23/2009   Dyspnea    GERD 07/23/2009   Headache(784.0) 07/23/2009   occasional   Hemorrhoids    HIP PAIN, LEFT 05/16/2010   History of Crohn's disease    HYPERLIPIDEMIA 07/23/2009   HYPERTENSION 07/23/2009   HYPOTHYROIDISM 07/23/2009   IBS (irritable bowel syndrome)    Insomnia    LOW BACK PAIN, CHRONIC 10/01/2009   lung ca dx'd 2019   SBRT  comp 12/2018   PARESTHESIA 10/01/2009   Shingles 2006   back    Past Surgical History:  Procedure Laterality Date   ABDOMINAL HYSTERECTOMY  age 42 or 76   APPENDECTOMY  yrs ago   BILATERAL SALPINGOOPHORECTOMY  age 59 or 74   COLONOSCOPY N/A 11/17/2013   Procedure: COLONOSCOPY;  Surgeon: Cleotis Nipper, MD;  Location: [facility];  Service: Endoscopy;  Laterality: N/A;   COLONOSCOPY N/A 07/16/2021   ESOPHAGOGASTRODUODENOSCOPY N/A 11/17/2013   Procedure: ESOPHAGOGASTRODUODENOSCOPY (EGD);  Surgeon: Cleotis Nipper, MD;  Location: [facility];  Service: Endoscopy;  Laterality: N/A;   FINE NEEDLE ASPIRATION  10/15/2021   Procedure: FINE NEEDLE ASPIRATION (FNA) LINEAR;  Surgeon: Garner Nash, DO;  Location: [facility];  Service: Pulmonary;;   HEMORRHOID SURGERY  yrs ago   TONSILLECTOMY  yrs ago   [place] Right 10/15/2021   Procedure: VIDEO BRONCHOSCOPY WITH ENDOBRONCHIAL ULTRASOUND;  Surgeon: Garner Nash, DO;  Location: [facility];  Service: Pulmonary;  Laterality: Right;    Social History   Socioeconomic History   Marital status: Widowed    Spouse name: Not on file   Number of children: 1   Years of education: Not on file   Highest education level: Not on file  Occupational History   Not on file  Tobacco Use   Smoking status: Former    Packs/day: 0.50    Types: Cigarettes    Start date: 07/16/1960    Quit date: 07/16/1998    Years since quitting: 24.0   Smokeless tobacco: Never  Vaping Use   Vaping Use: Never used  Substance and Sexual Activity   Alcohol use: No    Alcohol/week: 0.0 standard drinks of alcohol   Drug use: No   Sexual activity: Never  Other Topics Concern   Not on file  Social History Narrative   Widowed.  Lives alone in her own home.  Ambulates with a cane/walker when needed.   Social Determinants of Health   Financial Resource Strain: Low Risk  (06/25/2022)   Overall Financial Resource Strain  (CARDIA)    Difficulty of Paying Living Expenses: Not hard at all  Food Insecurity: No Food Insecurity (06/25/2022)   Hunger Vital Sign    Worried About Running Out of Food in the Last Year: Never true    Ran Out of Food in the Last Year: Never true  Transportation Needs: No Transportation Needs (06/25/2022)   PRAPARE - Hydrologist (Medical): No    Lack of Transportation (Non-Medical): No  Physical Activity: Inactive (06/25/2022)   Exercise Vital Sign    Days of Exercise per Week: 0 days    Minutes of Exercise per Session: 0 min  Stress: No Stress Concern Present (06/25/2022)   [place]    Feeling of Stress : Not at all  Social Connections: Socially Isolated (06/25/2022)   Social Connection and Isolation Panel [NHANES]    Frequency of Communication with Friends and Family: More than three times a week    Frequency of Social Gatherings with Friends and Family: More than three times a week    Attends Religious Services: Never    Marine scientist or Organizations: No    Attends Archivist Meetings: Never    Marital Status: Widowed  Intimate Partner Violence: Not At Risk (06/25/2022)   Humiliation, Afraid, Rape, and Kick questionnaire    Fear of Current or Ex-Partner: No    Emotionally Abused: No    Physically Abused: No    Sexually Abused: No    Family History  Problem Relation Age of Onset   Cancer Mother        Unknown type   Cancer Sister        Breast   Clotting disorder Neg Hx      Review of Systems  Constitutional: Negative.  Negative for chills and fever.  HENT: Negative.  Negative for congestion and sore throat.   Respiratory:  Positive for cough and sputum production. Negative for hemoptysis, shortness of breath and wheezing.   Cardiovascular:  Positive for chest pain. Negative for palpitations.  Gastrointestinal:  Negative for abdominal pain, diarrhea,  nausea and vomiting.  Genitourinary: Negative.   Skin: Negative.  Negative for rash.  Neurological: Negative.  Negative for dizziness and headaches.  All other systems reviewed and are negative.  Today's  Vitals   07/22/22 1419  BP: 118/64  Pulse: (!) 101  Temp: 98.3 F (36.8 C)  TempSrc: Oral  SpO2: 91%  Height: 4\' 11"  (1.499 m)   Body mass index is 25.85 kg/m.   Physical Exam Vitals reviewed.  Constitutional:      Appearance: Normal appearance.  HENT:     Head: Normocephalic.  Eyes:     Extraocular Movements: Extraocular movements intact.     Pupils: Pupils are equal, round, and reactive to light.  Cardiovascular:     Rate and Rhythm: Normal rate and regular rhythm.     Pulses: Normal pulses.     Heart sounds: Normal heart sounds.  Pulmonary:     Effort: Pulmonary effort is normal.     Breath sounds: Normal breath sounds.  Musculoskeletal:     Right lower leg: No edema.     Left lower leg: No edema.  Skin:    General: Skin is warm and dry.  Neurological:     Mental Status: She is alert and oriented to person, place, and time.  Psychiatric:        Mood and Affect: Mood normal.        Behavior: Behavior normal.     DG Chest 2 View  Result Date: 07/22/2022 CLINICAL DATA:  Right chest pain, cough EXAM: CHEST - 2 VIEW COMPARISON:  Previous studies including CT done on 12/16/2021 FINDINGS: Cardiac size is within normal limits. Low position of diaphragms suggests COPD. There are patchy infiltrates in right parahilar region. There are linear densities in right lower lung field. Left lung is essentially clear. There is no significant pleural effusion or pneumothorax. IMPRESSION: COPD. There is new patchy infiltrate in right parahilar region suggesting atelectasis/pneumonia. Linear densities in right lower lung field may suggest scarring. Electronically Signed   By: Elmer Picker M.D.   On: 07/22/2022 14:56    ASSESSMENT & PLAN: A total of 49 minutes was spent with  the patient and counseling/coordination of care regarding preparing for this visit, review of most recent office visit notes, review of multiple chronic medical problems and their management, review of all medications, review of chest x-ray images, diagnosis of pneumonia and treatment, need for antibiotics, ED precautions, prognosis, documentation, and need for follow-up.  Problem List Items Addressed This Visit       Cardiovascular and Mediastinum   Essential hypertension    Well-controlled hypertension. BP Readings from Last 3 Encounters:  07/22/22 118/64  06/30/22 116/64  06/24/22 (!) 167/85  Continue present medications.  No changes.         Respiratory   Pneumonia of right lung due to infectious organism    Productive cough.  Chest x-ray suggestive of early pneumonia on right side. Will benefit from azithromycin daily for 5 days.      Relevant Medications   azithromycin (ZITHROMAX) 250 MG tablet     Genitourinary   OAB (overactive bladder)    Presently on Ditropan and Myrbetriq. No history of congestive heart failure.  No edema of lower extremities. Advised to stop Lasix for the time being.  May need to restart it if edema returns.        Other   Right-sided chest pain - Primary    Differential diagnosis discussed.  No pneumothorax on chest x-ray.  No pleural effusion.  No abnormal masses.  Infiltrate suggestive of early pneumonia. Clinically stable.  Pleuritic chest pain. ED precautions given. Advised to contact the office if no better or worse  during the next several days.      Relevant Orders   DG Chest 2 View (Completed)   Patient Instructions  Community-Acquired Pneumonia, Adult Pneumonia is an infection of the lungs. It causes irritation and swelling in the airways of the lungs. Mucus and fluid may also build up inside the airways. This may cause coughing and trouble breathing. One type of pneumonia can happen while you are in a hospital. A different type  can happen when you are not in a hospital (community-acquired pneumonia). What are the causes?  This condition is caused by germs (viruses, bacteria, or fungi). Some types of germs can spread from person to person. Pneumonia is not thought to spread from person to person. What increases the risk? You have a long-term (chronic) disease, such as: Disease of the lungs. This may be chronic obstructive pulmonary disease (COPD) or asthma. Heart failure. Cystic fibrosis. Diabetes. Kidney disease. Sickle cell disease. HIV. You have other health problems, such as: Your body's defense system (immune system) is weak. A condition that may cause you to breathe in fluids from your mouth and nose. You had your spleen taken out. You do not take good care of your teeth and mouth (poor dental hygiene). You use or have used tobacco products. You go where the germs that cause this illness are common. You are older than 85 years of age. What are the signs or symptoms? A cough. A fever. Sweating or chills. Chest pain, often when you breathe deeply or cough. Breathing problems, such as: Fast breathing. Trouble breathing. Shortness of breath. Feeling tired (fatigued). Muscle aches. How is this treated? Treatment for this condition depends on many things, such as: The cause of your illness. Your medicines. Your other health problems. Most adults can be treated at home. Sometimes, treatment must happen in a hospital. Treatment may include medicines to kill germs. Medicines may depend on which germ caused your illness. Very bad pneumonia is rare. If you get it, you may: Have a machine to help you breathe. Have fluid taken away from around your lungs. Follow these instructions at home: Medicines Take over-the-counter and prescription medicines only as told by your doctor. Take cough medicine only if you are losing sleep. Cough medicine can keep your body from taking mucus away from your lungs. If  you were prescribed antibiotics, take them as told by your doctor. Do not stop taking them even if you start to feel better. Lifestyle     Do not smoke or use any products that contain nicotine or tobacco. If you need help quitting, ask your doctor. Do not drink alcohol. Eat a healthy diet. This includes a lot of vegetables, fruits, whole grains, low-fat dairy products, and low-fat (lean) protein. General instructions  Rest a lot. Sleep for at least 8 hours each night. Sleep with your head and neck raised. Put a few pillows under your head or sleep in a reclining chair. Return to your normal activities as told by your doctor. Ask your doctor what activities are safe for you. Drink enough fluid to keep your pee (urine) pale yellow. If your throat is sore, gargle with a mixture of salt and water 3-4 times a day or as needed. To make salt water, completely dissolve -1 tsp (3-6 g) of salt in 1 cup (237 mL) of warm water. Keep all follow-up visits. How is this prevented? Getting the pneumonia shot (vaccine). These shots have different types and schedules. Ask your doctor what works best for you.  Think about getting this shot if: You are older than 85 years of age. You are 31-33 years of age and: You are being treated for cancer. You have long-term lung disease. You have other problems that affect your body's defense system. Ask your doctor if you have one of these. Getting your flu shot every year. Ask your doctor which type of shot is best for you. Going to the dentist as often as told. Washing your hands often with soap and water for at least 20 seconds. If you cannot use soap and water, use hand sanitizer. Contact a doctor if: You have a fever. You lose sleep because your cough medicine does not help. Get help right away if: You are short of breath and this gets worse. You have more chest pain. Your sickness gets worse. This is very serious if: You are an older adult. Your body's  defense system is weak. You cough up blood. These symptoms may be an emergency. Get help right away. Call 911. Do not wait to see if the symptoms will go away. Do not drive yourself to the hospital. Summary Pneumonia is an infection of the lungs. Community-acquired pneumonia affects people who have not been in the hospital. Certain germs can cause this infection. This condition may be treated with medicines that kill germs. For very bad pneumonia, you may need a hospital stay and treatment to help with breathing. This information is not intended to replace advice given to you by your health care provider. Make sure you discuss any questions you have with your health care provider. Document Revised: 01/22/2022 Document Reviewed: 01/22/2022 Elsevier Patient Education  [place], MD [facility]

## 2022-07-22 NOTE — Patient Instructions (Signed)

## 2022-07-22 NOTE — Assessment & Plan Note (Signed)
Differential diagnosis discussed.  No pneumothorax on chest x-ray.  No pleural effusion.  No abnormal masses.  Infiltrate suggestive of early pneumonia. Clinically stable.  Pleuritic chest pain. ED precautions given. Advised to contact the office if no better or worse during the next several days.

## 2022-07-22 NOTE — Assessment & Plan Note (Signed)
Presently on Ditropan and Myrbetriq. No history of congestive heart failure.  No edema of lower extremities. Advised to stop Lasix for the time being.  May need to restart it if edema returns.

## 2022-07-22 NOTE — Assessment & Plan Note (Signed)
Productive cough.  Chest x-ray suggestive of early pneumonia on right side. Will benefit from azithromycin daily for 5 days.

## 2022-07-22 NOTE — Assessment & Plan Note (Signed)
Well-controlled hypertension. BP Readings from Last 3 Encounters:  07/22/22 118/64  06/30/22 116/64  06/24/22 (!) 167/85  Continue present medications.  No changes.

## 2022-07-25 ENCOUNTER — Telehealth: Payer: Self-pay | Admitting: Internal Medicine

## 2022-07-25 NOTE — Telephone Encounter (Signed)
Patient is not feeling any better today - should she bee seen again.

## 2022-07-28 NOTE — Telephone Encounter (Signed)
Called patient she states she was feeling a lot better today. Patient states she is having night sweats and was prescribed something in the past for them. Patient was unsure the name of the medication. Please advise

## 2022-08-27 ENCOUNTER — Other Ambulatory Visit: Payer: Self-pay | Admitting: Internal Medicine

## 2022-08-27 DIAGNOSIS — J438 Other emphysema: Secondary | ICD-10-CM

## 2022-08-27 DIAGNOSIS — F32A Depression, unspecified: Secondary | ICD-10-CM

## 2022-08-27 DIAGNOSIS — J209 Acute bronchitis, unspecified: Secondary | ICD-10-CM

## 2022-08-27 DIAGNOSIS — E039 Hypothyroidism, unspecified: Secondary | ICD-10-CM

## 2022-08-27 DIAGNOSIS — I2510 Atherosclerotic heart disease of native coronary artery without angina pectoris: Secondary | ICD-10-CM

## 2022-08-27 DIAGNOSIS — E440 Moderate protein-calorie malnutrition: Secondary | ICD-10-CM

## 2022-08-27 DIAGNOSIS — K219 Gastro-esophageal reflux disease without esophagitis: Secondary | ICD-10-CM

## 2022-08-27 DIAGNOSIS — N3281 Overactive bladder: Secondary | ICD-10-CM

## 2022-08-27 DIAGNOSIS — I10 Essential (primary) hypertension: Secondary | ICD-10-CM

## 2022-09-01 ENCOUNTER — Telehealth: Payer: Self-pay

## 2022-09-01 NOTE — Telephone Encounter (Signed)
approved through 03/02/2023.

## 2022-09-01 NOTE — Telephone Encounter (Signed)
Key: Lovette Cliche

## 2022-09-07 ENCOUNTER — Other Ambulatory Visit: Payer: Self-pay | Admitting: Internal Medicine

## 2022-09-07 DIAGNOSIS — M1612 Unilateral primary osteoarthritis, left hip: Secondary | ICD-10-CM

## 2022-09-07 DIAGNOSIS — G4701 Insomnia due to medical condition: Secondary | ICD-10-CM

## 2022-09-08 ENCOUNTER — Other Ambulatory Visit: Payer: Self-pay | Admitting: Internal Medicine

## 2022-09-08 DIAGNOSIS — M1612 Unilateral primary osteoarthritis, left hip: Secondary | ICD-10-CM

## 2022-09-08 DIAGNOSIS — G8929 Other chronic pain: Secondary | ICD-10-CM

## 2022-09-26 ENCOUNTER — Encounter
Payer: Medicare Other | Attending: Physical Medicine and Rehabilitation | Admitting: Physical Medicine and Rehabilitation

## 2022-09-26 VITALS — BP 146/81 | HR 80

## 2022-09-26 DIAGNOSIS — M797 Fibromyalgia: Secondary | ICD-10-CM | POA: Insufficient documentation

## 2022-09-26 MED ORDER — LIDOCAINE HCL 1 % IJ SOLN
3.0000 mL | Freq: Once | INTRAMUSCULAR | Status: AC
  Administered 2022-09-26: 3 mL

## 2022-09-26 MED ORDER — BETAMETHASONE SOD PHOS & ACET 6 (3-3) MG/ML IJ SUSP
12.0000 mg | Freq: Once | INTRAMUSCULAR | Status: AC
  Administered 2022-09-26: 12 mg via INTRAMUSCULAR

## 2022-09-26 MED ORDER — GABAPENTIN 600 MG PO TABS: 600.0000 mg | ORAL_TABLET | Freq: Four times a day (QID) | ORAL | 3 refills | Status: DC

## 2022-09-26 NOTE — Progress Notes (Signed)
Trigger Point Injection  Indication: Lumbar myofascial pain not relieved by medication management and other conservative care.  Informed consent was obtained after describing risk and benefits of the procedure with the patient, this includes bleeding, bruising, infection and medication side effects.  The patient wishes to proceed and has given written consent.  The patient was placed in a seated position.  The area of pain was marked and prepped with Betadine.  It was entered with a 25-gauge 1/2 inch needle and a total of 5 mL of 1% lidocaine and normal saline was injected into a total of 4 trigger points, after negative draw back for blood.  The patient tolerated the procedure well.  Post procedure instructions were given.

## 2022-10-01 ENCOUNTER — Other Ambulatory Visit: Payer: Self-pay | Admitting: Internal Medicine

## 2022-10-01 DIAGNOSIS — T402X5A Adverse effect of other opioids, initial encounter: Secondary | ICD-10-CM

## 2022-11-01 ENCOUNTER — Other Ambulatory Visit: Payer: Self-pay | Admitting: Internal Medicine

## 2022-11-17 ENCOUNTER — Telehealth: Payer: Self-pay | Admitting: Internal Medicine

## 2022-11-17 ENCOUNTER — Other Ambulatory Visit: Payer: Self-pay | Admitting: Internal Medicine

## 2022-11-17 DIAGNOSIS — M1612 Unilateral primary osteoarthritis, left hip: Secondary | ICD-10-CM

## 2022-11-17 DIAGNOSIS — G8929 Other chronic pain: Secondary | ICD-10-CM

## 2022-11-17 DIAGNOSIS — Z515 Encounter for palliative care: Secondary | ICD-10-CM

## 2022-11-17 NOTE — Telephone Encounter (Signed)
Patient called to follow up and asked if something else could be called in because she said the pharmacy told her that this medication had been discontinued

## 2022-11-17 NOTE — Telephone Encounter (Signed)
[facility] called - they do not have the oxycodone that Dr. Ronnald Ramp ordered.  They have let patient know.

## 2022-11-24 ENCOUNTER — Other Ambulatory Visit: Payer: Self-pay | Admitting: Internal Medicine

## 2022-11-24 DIAGNOSIS — K219 Gastro-esophageal reflux disease without esophagitis: Secondary | ICD-10-CM

## 2022-11-24 DIAGNOSIS — J438 Other emphysema: Secondary | ICD-10-CM

## 2022-11-24 DIAGNOSIS — E440 Moderate protein-calorie malnutrition: Secondary | ICD-10-CM

## 2022-11-24 DIAGNOSIS — I2510 Atherosclerotic heart disease of native coronary artery without angina pectoris: Secondary | ICD-10-CM

## 2022-11-24 DIAGNOSIS — N3281 Overactive bladder: Secondary | ICD-10-CM

## 2022-11-24 DIAGNOSIS — J209 Acute bronchitis, unspecified: Secondary | ICD-10-CM

## 2022-11-24 DIAGNOSIS — F419 Anxiety disorder, unspecified: Secondary | ICD-10-CM

## 2022-11-24 DIAGNOSIS — E039 Hypothyroidism, unspecified: Secondary | ICD-10-CM

## 2022-11-24 DIAGNOSIS — I10 Essential (primary) hypertension: Secondary | ICD-10-CM

## 2022-11-26 ENCOUNTER — Emergency Department (HOSPITAL_COMMUNITY): Payer: Medicare Other

## 2022-11-26 ENCOUNTER — Emergency Department (HOSPITAL_COMMUNITY)
Admission: EM | Admit: 2022-11-26 | Discharge: 2022-11-27 | Disposition: A | Discharge status: Home / Self Care | Payer: Medicare Other | Attending: Emergency Medicine | Admitting: Emergency Medicine

## 2022-11-26 ENCOUNTER — Encounter (HOSPITAL_COMMUNITY): Payer: Self-pay | Admitting: Emergency Medicine

## 2022-11-26 DIAGNOSIS — M1611 Unilateral primary osteoarthritis, right hip: Secondary | ICD-10-CM | POA: Diagnosis not present

## 2022-11-26 DIAGNOSIS — G8929 Other chronic pain: Secondary | ICD-10-CM | POA: Diagnosis not present

## 2022-11-26 DIAGNOSIS — M25551 Pain in right hip: Secondary | ICD-10-CM | POA: Diagnosis not present

## 2022-11-26 DIAGNOSIS — M545 Low back pain, unspecified: Secondary | ICD-10-CM | POA: Diagnosis not present

## 2022-11-26 DIAGNOSIS — I10 Essential (primary) hypertension: Secondary | ICD-10-CM | POA: Insufficient documentation

## 2022-11-26 DIAGNOSIS — R6889 Other general symptoms and signs: Secondary | ICD-10-CM | POA: Diagnosis not present

## 2022-11-26 DIAGNOSIS — R0602 Shortness of breath: Secondary | ICD-10-CM | POA: Diagnosis not present

## 2022-11-26 DIAGNOSIS — Z743 Need for continuous supervision: Secondary | ICD-10-CM | POA: Diagnosis not present

## 2022-11-26 DIAGNOSIS — M549 Dorsalgia, unspecified: Secondary | ICD-10-CM | POA: Diagnosis not present

## 2022-11-26 DIAGNOSIS — J449 Chronic obstructive pulmonary disease, unspecified: Secondary | ICD-10-CM | POA: Insufficient documentation

## 2022-11-26 DIAGNOSIS — Z79899 Other long term (current) drug therapy: Secondary | ICD-10-CM | POA: Insufficient documentation

## 2022-11-26 DIAGNOSIS — R410 Disorientation, unspecified: Secondary | ICD-10-CM | POA: Diagnosis not present

## 2022-11-26 DIAGNOSIS — I499 Cardiac arrhythmia, unspecified: Secondary | ICD-10-CM | POA: Diagnosis not present

## 2022-11-26 NOTE — ED Provider Triage Note (Signed)
Emergency Medicine Provider Triage Evaluation Note  Heather [name] , a 87 y.o. female  was evaluated in triage.  Pt complains of chronic back, hip pain, hx of COPD, sounded shob, anxious to son on the phone. HX of dementia. Denies numbness, tingling, chest pain, abdominal pain, nausea, vomiting.  Review of Systems  Positive: Shob, anxious, back pain, hip pain Negative: Chest pain, abdominal pain, nausea, vomiting  Physical Exam  BP (!) 152/92 (BP Location: Left Arm)   Pulse 86   Temp 99 F (37.2 C) (Oral)   Resp 18   SpO2 93%  Gen:   Awake, no distress   Resp:  Normal effort  MSK:   Moves extremities without difficulty  Other:  Ambulatory with with assistance, moves all 4 limbs spontaneously  Medical Decision Making  Medically screening exam initiated at 9:46 PM.  Appropriate orders placed.  Heather [name] was informed that the remainder of the evaluation will be completed by another provider, this initial triage assessment does not replace that evaluation, and the importance of remaining in the ED until their evaluation is complete.  Workup initiated in triage    Anselmo Pickler, [place] 11/26/22 2148

## 2022-11-26 NOTE — ED Triage Notes (Signed)
Pts son called EMS initially because he was on the phone with pt and she sounded short of breath and anxious. Pt has hx of COPD. Per son, pt has chronic back and hip pain. Pt was complaining about her back and hip hurting to son on the phone and to EMS personnel. Pt has hx of dementia as well. A&O x3

## 2022-11-27 ENCOUNTER — Emergency Department (HOSPITAL_COMMUNITY): Payer: Medicare Other

## 2022-11-27 DIAGNOSIS — M1611 Unilateral primary osteoarthritis, right hip: Secondary | ICD-10-CM | POA: Diagnosis not present

## 2022-11-27 DIAGNOSIS — G8929 Other chronic pain: Secondary | ICD-10-CM | POA: Diagnosis not present

## 2022-11-27 LAB — CBC
HCT: 38.4 % (ref 36.0–46.0)
Hemoglobin: 12.5 g/dL (ref 12.0–15.0)
MCH: 30.9 pg (ref 26.0–34.0)
MCHC: 32.6 g/dL (ref 30.0–36.0)
MCV: 95 fL (ref 80.0–100.0)
Platelets: 286 10*3/uL (ref 150–400)
RBC: 4.04 MIL/uL (ref 3.87–5.11)
RDW: 12.4 % (ref 11.5–15.5)
WBC: 5.9 10*3/uL (ref 4.0–10.5)
nRBC: 0 % (ref 0.0–0.2)

## 2022-11-27 LAB — BASIC METABOLIC PANEL WITH GFR
Anion gap: 14 (ref 5–15)
BUN: 9 mg/dL (ref 8–23)
CO2: 21 mmol/L — ABNORMAL LOW (ref 22–32)
Calcium: 9.5 mg/dL (ref 8.9–10.3)
Chloride: 104 mmol/L (ref 98–111)
Creatinine, Ser: 0.86 mg/dL (ref 0.44–1.00)
GFR, Estimated: >60 mL/min
Glucose, Bld: 90 mg/dL (ref 70–99)
Potassium: 3 mmol/L — ABNORMAL LOW (ref 3.5–5.1)
Sodium: 139 mmol/L (ref 135–145)

## 2022-11-27 MED ORDER — KETOROLAC TROMETHAMINE 15 MG/ML IJ SOLN
15.0000 mg | Freq: Once | INTRAMUSCULAR | Status: AC
  Administered 2022-11-27: 15 mg via INTRAMUSCULAR
  Filled 2022-11-27: qty 1

## 2022-11-27 MED ORDER — POTASSIUM CHLORIDE CRYS ER 20 MEQ PO TBCR
60.0000 meq | EXTENDED_RELEASE_TABLET | Freq: Once | ORAL | Status: AC
  Administered 2022-11-27: 60 meq via ORAL
  Filled 2022-11-27: qty 3

## 2022-11-27 MED ORDER — OXYCODONE HCL 5 MG PO TABS
10.0000 mg | ORAL_TABLET | Freq: Once | ORAL | Status: AC
  Administered 2022-11-27: 10 mg via ORAL
  Filled 2022-11-27: qty 2

## 2022-11-27 MED ORDER — LOSARTAN POTASSIUM 25 MG PO TABS
25.0000 mg | ORAL_TABLET | Freq: Once | ORAL | Status: AC
  Administered 2022-11-27: 25 mg via ORAL
  Filled 2022-11-27: qty 1

## 2022-11-27 MED ORDER — LIDOCAINE 5 % EX PTCH
1.0000 | MEDICATED_PATCH | CUTANEOUS | Status: DC
  Administered 2022-11-27: 1 via TRANSDERMAL
  Filled 2022-11-27: qty 1

## 2022-11-27 NOTE — ED Notes (Addendum)
Called son [name], he is getting dressed and will be here as soon as he can, he is coming from [place].

## 2022-11-27 NOTE — Discharge Instructions (Addendum)
Return to the ED with any new or worsening signs or symptoms Please follow-up with PCP for further management of chronic low back pain and hip pain Please continue taking home medications as prescribed Please read attached guide on hip pain

## 2022-11-27 NOTE — ED Provider Notes (Signed)
[facility] DEPT Provider Note   CSN: [id number] Arrival date & time: 11/26/22  2100     History  Chief Complaint  Patient presents with   Back Pain    Chronic   Hip Pain    chronic    Heather [name] is a 87 y.o. female with medical history of left hip pain, anxiety, depression, GERD, hypertension, arthritis, COPD, chronic low back pain.  Patient presents to ED for evaluation of right hip pain, low back pain.  Patient reports that right hip pains been ongoing for quite some time, she is unsure of the exact time it started.  Patient denies any recent trauma, event to account for this pain.  Patient states that she has been taking her prescribed oxycodone for her chronic low back pain and this has been alleviating pain however pain always returns.  The patient denies any red flag symptoms of low back pain, states that this low back pain is very similar to her previous episodes of low back pain.  Patient reportedly was on the phone with her son earlier and he believed that she sounded short of breath and anxious.  The patient has a history of COPD and per son the patient also has chronic back and hip pain.  Patient was complaining about her back and hip hurting so the son called EMS.  The patient on examination is alert and oriented x 4.  Patient states that she has been short of breath for "a few months".  The patient denies any wheezing, chest pain, fevers.  Patient denies still smoking or using oxygen at home.   Back Pain Associated symptoms: no chest pain and no fever   Hip Pain Associated symptoms include shortness of breath. Pertinent negatives include no chest pain.       Home Medications Prior to Admission medications   Medication Sig Start Date End Date Taking? Authorizing Provider  Albuterol Sulfate (PROAIR RESPICLICK) 315 (90 Base) MCG/ACT AEPB Inhale 2 puffs into the lungs 4 (four) times daily as needed. 03/25/22   Horald Pollen, MD   azithromycin [facility]) 250 MG tablet Sig as indicated 07/22/22   Horald Pollen, MD  BREO ELLIPTA 200-25 MCG/ACT AEPB Inhale 1 puff into the lungs daily. 11/24/22   [name], MD  clorazepate (TRANXENE) 7.5 MG tablet Take 1 tablet (7.5 mg total) by mouth 2 (two) times daily as needed for anxiety. 06/24/22   Raulkar, Clide Deutscher, MD  Dextromethorphan-guaiFENesin (MUCINEX DM) 30-600 MG TB12 Take 1 tablet by mouth 2 (two) times daily. 10/07/21   Parrett, Fonnie Mu, NP  docusate sodium (COLACE) 250 MG capsule Take 250 mg by mouth daily.    [provider]  doxepin (SINEQUAN) 10 MG capsule TAKE ONE CAPSULE BY MOUTH AT BEDTIME AS NEEDED 11/24/22   [name], MD  esomeprazole (NEXIUM) 40 MG capsule TAKE ONE CAPSULE BY MOUTH DAILY 11/24/22   [name], MD  folic acid (FOLVITE) 1 MG tablet TAKE ONE TABLET BY MOUTH DAILY 11/24/22   [name], MD  furosemide (LASIX) 20 MG tablet TAKE 1 TABLET DAILY IF NEEDED FOR SIGNIFICANT LOWER EXTREMITY SWELLING. 01/19/19   Marrian Salvage, FNP  gabapentin (NEURONTIN) 600 MG tablet Take 1 tablet (600 mg total) by mouth 4 (four) times daily. 09/26/22   Raulkar, Clide Deutscher, MD  levothyroxine (SYNTHROID) 25 MCG tablet TAKE ONE TABLET BY MOUTH DAILY BEFORE BREAKFAST 11/24/22   [name], MD  losartan (  COZAAR) 25 MG tablet TAKE ONE TABLET BY MOUTH DAILY HIGH BLOOD PRESSURE 11/24/22   [name], MD  mirabegron ER (MYRBETRIQ) 50 MG TB24 tablet Take 1 tablet (50 mg total) by mouth daily. 06/27/22   [name], MD  oxybutynin (DITROPAN-XL) 5 MG 24 hr tablet TAKE ONE TABLET BY MOUTH IN THE MORNING FOR BLADDER CONTROL 11/24/22   [name], MD  Oxycodone HCl 10 MG TABS TAKE ONE TABLET BY MOUTH EVERY 8 HOURS AS NEEDED 11/17/22   [name], MD  Plecanatide (TRULANCE) 3 MG TABS Take 1 tablet by mouth daily. 06/30/22   [name], MD  polyvinyl alcohol (LIQUIFILM TEARS) 1.4 % ophthalmic solution Place 1 drop into  both eyes as needed for dry eyes.    [provider]  potassium chloride SA (KLOR-CON M) 20 MEQ tablet TAKE ONE TABLET BY MOUTH DAILY 11/24/22   [name], MD  pregabalin (LYRICA) 25 MG capsule Take 1 capsule (25 mg total) by mouth 2 (two) times daily. 06/24/22   Raulkar, Clide Deutscher, MD  promethazine (PHENERGAN) 25 MG tablet TAKE ONE TABLET BY MOUTH EVERY 8 HOURS AS NEEDED FOR NAUSEA AND VOMITING 11/07/22   [name], MD  RELISTOR 150 MG TABS TAKE THREE TABLETS BY MOUTH DAILY 10/01/22   [name], MD  sertraline (ZOLOFT) 50 MG tablet TAKE ONE TABLET BY MOUTH EVERY DAY 11/24/22   [name], MD  vitamin B-12 (CYANOCOBALAMIN) 50 MCG tablet Take 50 mcg by mouth daily.    [provider]  VITAMIN D-1000 MAX ST 25 MCG (1000 UT) tablet Take 1,000 Units by mouth daily. 02/24/20   [provider]      Allergies    Tylenol [acetaminophen]    Review of Systems   Review of Systems  Constitutional:  Negative for fever.  Respiratory:  Positive for shortness of breath. Negative for wheezing.   Cardiovascular:  Negative for chest pain.  Musculoskeletal:  Positive for arthralgias and back pain.  All other systems reviewed and are negative.   Physical Exam Updated Vital Signs BP (!) 157/67 (BP Location: Left Arm)   Pulse 72   Temp 97.8 F (36.6 C) (Oral)   Resp 16   SpO2 96%  Physical Exam Vitals and nursing note reviewed.  Constitutional:      General: She is not in acute distress.    Appearance: She is well-developed.  HENT:     Head: Normocephalic and atraumatic.     Nose: Nose normal.     Mouth/Throat:     Mouth: Mucous membranes are moist.     Pharynx: Oropharynx is clear.  Eyes:     Conjunctiva/sclera: Conjunctivae normal.     Pupils: Pupils are equal, round, and reactive to light.  Cardiovascular:     Rate and Rhythm: Normal rate and regular rhythm.     Heart sounds: No murmur heard. Pulmonary:     Effort: Pulmonary effort is normal.  No respiratory distress.     Breath sounds: Normal breath sounds.  Abdominal:     General: Abdomen is flat. Bowel sounds are normal.     Palpations: Abdomen is soft.     Tenderness: There is no abdominal tenderness.  Musculoskeletal:        General: No swelling.     Cervical back: Normal range of motion and neck supple. No tenderness.     Comments: Lumbar spine palpated without findings of deformity, crepitus, step-off.  No paraspinal tenderness.  Skin:    General: Skin is warm and dry.     Capillary Refill: Capillary refill takes less than 2 seconds.  Neurological:     General: No focal deficit present.     Mental Status: She is alert and oriented to person, place, and time. Mental status is at baseline.     GCS: GCS eye subscore is 4. GCS verbal subscore is 5. GCS motor subscore is 6.     Cranial Nerves: Cranial nerves 2-12 are intact. No cranial nerve deficit.     Sensory: Sensation is intact. No sensory deficit.     Motor: Motor function is intact. No weakness.     Coordination: Coordination is intact. Heel to [facility] Test normal.  Psychiatric:        Mood and Affect: Mood normal.     ED Results / Procedures / Treatments   Labs (all labs ordered are listed, but only abnormal results are displayed) Labs Reviewed  BASIC METABOLIC PANEL - Abnormal; Notable for the following components:      Result Value   Potassium 3.0 (*)    CO2 21 (*)    All other components within normal limits  CBC    EKG None  Radiology DG Hip Unilat W or Wo Pelvis 2-3 Views Right  Result Date: 11/27/2022 CLINICAL DATA:  Chronic hip pain EXAM: DG HIP (WITH OR WITHOUT PELVIS) 2-3V RIGHT COMPARISON:  None Available. FINDINGS: No fracture or dislocation is seen. Mild degenerative changes of the right hip. Left hip joint space is preserved. Visualized bony pelvis appears intact. IMPRESSION: No fracture or dislocation is seen. Mild degenerative changes of the right hip. Electronically Signed   By: Julian Hy M.D.   On: 11/27/2022 03:30   DG Chest 2 View  Result Date: 11/26/2022 CLINICAL DATA:  Shortness of breath, COPD.  07/22/2022. EXAM: CHEST - 2 VIEW COMPARISON:  None Available. FINDINGS: The heart size and mediastinal contours are within normal limits. Stable linear hyperdensities in the hilar region on the right. Stable subsegmental atelectasis or scarring is noted at the right lung base. No consolidation, effusion, or pneumothorax. There is a compression deformity with augmentation changes at L2. IMPRESSION: Minimal atelectasis or scarring at the right lung base. Electronically Signed   By: Brett Fairy M.D.   On: 11/26/2022 22:12    Procedures Procedures   Medications Ordered in ED Medications  lidocaine (LIDODERM) 5 % 1 patch (1 patch Transdermal Patch Applied 11/27/22 0434)  ketorolac (TORADOL) 15 MG/ML injection 15 mg (15 mg Intramuscular Given 11/27/22 0434)  potassium chloride SA (KLOR-CON M) CR tablet 60 mEq (60 mEq Oral Given 11/27/22 0455)  losartan (COZAAR) tablet 25 mg (25 mg Oral Given 11/27/22 0456)  oxyCODONE (Oxy IR/ROXICODONE) immediate release tablet 10 mg (10 mg Oral Given 11/27/22 0601)    ED Course/ Medical Decision Making/ A&P                           Medical Decision Making Amount and/or Complexity of Data Reviewed Radiology: ordered.  Risk Prescription drug management.   87 year old female presents to the ED for evaluation of shortness of breath, chronic low back pain and hip pain.  Please see HPI for further details.  On my examination the patient is afebrile, nontachycardic.  The patient lung sounds are clear bilaterally, she is not hypoxic on room air with oxygen saturation of 96%.  Patient abdomen soft and compressible  throughout.  Patient complaining of right hip pain however no shortening or rotation, no overlying skin change, range of motion intact.  Patient also complaining of lumbar spinal pain which she has history of.  No crepitus,  deformity or step-off of lumbar spine noted.  Plain film imaging of patient right hip shows no fracture or acute pathology, there is noted to be arthritis.  The patient CBC is unremarkable.  The patient BMP shows decreased potassium at 3 which was repleted with 60 mEq of oral potassium.  Patient chest x-ray shows no consolidations or effusions, there is noted scarring at base of right lung.  The patient EKG is nonischemic.  Patient given Toradol 15 mg, Lidoderm patch for pain.  After this is ministered, patient continued complain of pain so home dosage of oxycodone was administered.  Patient was also given 25 mg losartan which is her home antihypertensive due to high blood pressure.  At this time, the patient is stable for discharge.  The patient will be advised to follow back up with her PCP.  Patient advised to return to ED for any new or worsening signs or symptoms.  Patient voiced understanding.  Patient stable for discharge.  Final Clinical Impression(s) / ED Diagnoses Final diagnoses:  Chronic midline low back pain without sciatica  Right hip pain    Rx / DC Orders ED Discharge Orders     None         Azucena Cecil, PA-C 63/01/60 1093    Lianne Cure, DO 23/55/73 1516

## 2022-12-02 ENCOUNTER — Telehealth: Payer: Self-pay | Admitting: *Deleted

## 2022-12-02 ENCOUNTER — Encounter: Payer: Self-pay | Admitting: *Deleted

## 2022-12-02 NOTE — Patient Outreach (Signed)
Care Coordination [facility] Note Transition Care Management Follow-up Telephone Call Date of discharge and from where: ED Visit 11/26/22 Heather [name]; chronic back/ hip pain; shortness of breath; ED EMMI Red Alert: "No scheduled follow up appointment" How have you been since you were released from the hospital? Per caregiver/ son Heather [name], on [facility] DPR: "She is still in pain like she is most of the time. I just have not had time to make her a doctor appointment but you can have them call tomorrow if you want to-- I am not available to talk for the rest of the day.  She lives alone, but I am not far from her and I stay in close touch with her and manage all of her appointments and her medications.  I want to get her some home health physical therapy set up, but I don't know how to go about doing that" Any questions or concerns? Yes- ongoing chronic pain, no post- ED follow up appointment with PCP, unsure how to get home health services initiated  Items Reviewed: Did the pt receive and understand the discharge instructions provided? Yes  Medications obtained and verified? Yes  Other? No  Any new allergies since your discharge? No  Dietary orders reviewed? No Do you have support at home? Yes  patient lives at home by herself; son reports she is independent in self-care and confirms he assists with needs as indicated  Home Care and Equipment/Supplies: Were home health services ordered? no If so, what is the name of the agency? N/A  Has the agency set up a time to come to the patient's home? not applicable Were any new equipment or medical supplies ordered?  No What is the name of the medical supply agency? N/A Were you able to get the supplies/equipment? not applicable Do you have any questions related to the use of the equipment or supplies? No N/A  Functional Questionnaire: (I = Independent and D = Dependent) ADLs: I  assists with needs as indicated  Bathing/Dressing- I  Meal Prep- I  Eating-  I  Maintaining continence- I  Transferring/Ambulation- I  Managing Meds- I  assists with needs as indicated- Caregiver/ son reports he has recently assumed managing her medications and is using a weekly pill planner box  Follow up appointments reviewed:  PCP Hospital f/u appt confirmed? No  Scheduled to see - on - @ - sent request to scheduling care guide to facilitate scheduling of post- ED visit PCP appointment [facility] f/u appt confirmed? Yes  Scheduled to see physical med/ rehabilitation provider on 12/30/22 @ 1:20 pm Are transportation arrangements needed? No  If their condition worsens, is the pt aware to call PCP or go to the Emergency Dept.? Yes Was the patient provided with contact information for the PCP's office or ED? No caregiver declined- reports already has contact information for all care providers Was to pt encouraged to call back with questions or concerns? Yes  SDOH assessments and interventions completed:   Yes SDOH Interventions Today    Flowsheet Row Most Recent Value  SDOH Interventions   Food Insecurity Interventions Intervention Not Indicated  Transportation Interventions Intervention Not Indicated  [son provides transportation]      Care Coordination Interventions:  PCP follow up appointment requested Discussed role and scope of ED care and extensively reviewed ED visit and care with son; provided education around initiation of home health services post- recent ED visit and encouraged son to engage with scheduling care guide for prompt appointment with  PCP    Encounter Outcome:  Pt. Visit Completed    Oneta Rack, RN, BSN, CCRN Alumnus RN CM Care Coordination/ Transition of [place] Management [phone]: direct office

## 2022-12-03 ENCOUNTER — Telehealth: Payer: Self-pay | Admitting: *Deleted

## 2022-12-03 NOTE — Progress Notes (Signed)
  Care Coordination  Note  12/03/2022 Name: Heather [name] MRN: [id number] DOB: 08-Sep-1935  Heather [name] is a 87 y.o. year old primary care patient of [name], MD. I reached out to [name] by phone today to assist with scheduling a follow up appointment. Heather [name] verbally consented to my assistance.       Follow up plan: Hospital Follow Up appointment scheduled with (Dr. Jenny Reichmann) on (12/04/22) at (2:40 pm arrive 2:30 pm).  [place]  Direct Dial: [phone]

## 2022-12-03 NOTE — Progress Notes (Signed)
  Care Coordination  Outreach Note  12/03/2022 Name: Heather [name] MRN: [id number] DOB: 01-19-1935   Care Coordination Outreach Attempts: An unsuccessful telephone outreach was attempted today to offer the patient information about available care coordination services as a benefit of their health plan.   Follow Up Plan:  Additional outreach attempts will be made to offer the patient care coordination information and services.   Encounter Outcome:  No Answer  [place]  Direct Dial: [phone]

## 2022-12-03 NOTE — Progress Notes (Signed)
  Care Coordination  Note  12/03/2022 Name: Heather [name] MRN: [id number] DOB: [birth year]  [name] is a 87 y.o. year old primary care patient of [name], MD. I reached out to [name] by phone today to assist with scheduling a follow up appointment. ZAHLI VETSCH verbally consented to my assistance.       Follow up plan: Unsuccessful telephone outreach attempt made. A HIPAA compliant phone message was left for the patient providing contact information and requesting a return call.   [place]  Direct Dial: [phone]

## 2022-12-03 NOTE — Progress Notes (Signed)
  Care Coordination   Note   12/03/2022 Name: Heather [name] MRN: [id number] DOB: [birth year]  Heather [name] is a 87 y.o. year old female who sees [name], MD for primary care. I reached out to [name] by phone today to offer care coordination services.  [name] was given information about Care Coordination services today including:   The Care Coordination services include support from the care team which includes your Nurse Coordinator, Clinical Social Worker, or Pharmacist.  The Care Coordination team is here to help remove barriers to the health concerns and goals most important to you. Care Coordination services are voluntary, and the patient may decline or stop services at any time by request to their care team member.   Care Coordination Consent Status: Patient agreed to services and verbal consent obtained.   Follow up plan:  Telephone appointment with care coordination team member scheduled for:  12/12/22  Encounter Outcome:  Pt. Scheduled  [place]  Direct Dial: [phone]

## 2022-12-04 ENCOUNTER — Ambulatory Visit (INDEPENDENT_AMBULATORY_CARE_PROVIDER_SITE_OTHER): Payer: Medicare Other | Admitting: Internal Medicine

## 2022-12-04 ENCOUNTER — Encounter: Payer: Self-pay | Admitting: Internal Medicine

## 2022-12-04 VITALS — BP 120/70 | HR 80 | Temp 98.5°F | Ht 59.0 in

## 2022-12-04 DIAGNOSIS — R350 Frequency of micturition: Secondary | ICD-10-CM | POA: Diagnosis not present

## 2022-12-04 DIAGNOSIS — E538 Deficiency of other specified B group vitamins: Secondary | ICD-10-CM

## 2022-12-04 DIAGNOSIS — E876 Hypokalemia: Secondary | ICD-10-CM | POA: Insufficient documentation

## 2022-12-04 DIAGNOSIS — E611 Iron deficiency: Secondary | ICD-10-CM

## 2022-12-04 DIAGNOSIS — D509 Iron deficiency anemia, unspecified: Secondary | ICD-10-CM | POA: Diagnosis not present

## 2022-12-04 DIAGNOSIS — I10 Essential (primary) hypertension: Secondary | ICD-10-CM | POA: Diagnosis not present

## 2022-12-04 DIAGNOSIS — R531 Weakness: Secondary | ICD-10-CM | POA: Diagnosis not present

## 2022-12-04 DIAGNOSIS — E559 Vitamin D deficiency, unspecified: Secondary | ICD-10-CM

## 2022-12-04 LAB — HEPATIC FUNCTION PANEL
ALT: 8 U/L (ref 0–35)
AST: 16 U/L (ref 0–37)
Albumin: 4.2 g/dL (ref 3.5–5.2)
Alkaline Phosphatase: 69 U/L (ref 39–117)
Bilirubin, Direct: 0.1 mg/dL (ref 0.0–0.3)
Total Bilirubin: 0.6 mg/dL (ref 0.2–1.2)
Total Protein: 7.3 g/dL (ref 6.0–8.3)

## 2022-12-04 LAB — BASIC METABOLIC PANEL
BUN: 9 mg/dL (ref 6–23)
CO2: 29 mEq/L (ref 19–32)
Calcium: 9.7 mg/dL (ref 8.4–10.5)
Chloride: 103 mEq/L (ref 96–112)
Creatinine, Ser: 0.84 mg/dL (ref 0.40–1.20)
GFR: 63.35 mL/min (ref >60.00)
Glucose, Bld: 88 mg/dL (ref 70–99)
Potassium: 4.3 mEq/L (ref 3.5–5.1)
Sodium: 138 mEq/L (ref 135–145)

## 2022-12-04 LAB — IBC PANEL
Iron: 42 ug/dL (ref 42–145)
Saturation Ratios: 14.2 % — ABNORMAL LOW (ref 20.0–50.0)
TIBC: 296.8 ug/dL (ref 250.0–450.0)
Transferrin: 212 mg/dL (ref 212.0–360.0)

## 2022-12-04 NOTE — Progress Notes (Signed)
Patient ID: Heather [name], female   DOB: 09-26-1935, 87 y.o.   MRN: [id number]        Chief Complaint: follow up generalized weakness, HTN, hx of iron def anemia, hypokalemia       HPI:  Heather [name] is a 87 y.o. female here with son, pt lives alone but he is supportive and concerned; Pt has worsening several wks gradual worsening generalized weakness, recurring hip pain requiring every 3 mo cortisone and may be taking both gabapentin and lyrica per pain management provider but son will check on what she actually takes as he manages her meds.  Denies urinary symptoms such as dysuria, urgency, flank pain, hematuria or n/v, fever, chills, but may have had some urinary frequency last few days.  Seen in ED dec 21 with normal cbc, and K 3.0 tx with oral replacement No falls or recent injury.  Pt denies chest pain, increased sob or doe, wheezing, orthopnea, PND, increased LE swelling, palpitations, dizziness or syncope.   Pt denies polydipsia, polyuria, or new focal neuro s/s.  In wheelchair today but ambulates in the home and has been able to keep up with most ADLs.         Wt Readings from Last 3 Encounters:  06/30/22 128 lb (58.1 kg)  06/24/22 130 lb 12.8 oz (59.3 kg)  03/25/22 125 lb (56.7 kg)   BP Readings from Last 3 Encounters:  12/04/22 120/70  11/27/22 (!) 157/67  09/26/22 (!) 146/81         Past Medical History:  Diagnosis Date   Anemia    ANXIETY 07/23/2009   Arthritis    Atherosclerosis of aorta ([place]) 85/46/2703   Complication of anesthesia 7 or 8 yrs ago   woke up during colonscopy   COPD (chronic obstructive pulmonary disease) ([place])    Coronary atherosclerosis 07/24/2015   DEPRESSION 07/23/2009   DIVERTICULOSIS, COLON 07/23/2009   Dyspnea    GERD 07/23/2009   Headache(784.0) 07/23/2009   occasional   Hemorrhoids    HIP PAIN, LEFT 05/16/2010   History of Crohn's disease    HYPERLIPIDEMIA 07/23/2009   HYPERTENSION 07/23/2009   HYPOTHYROIDISM 07/23/2009   IBS  (irritable bowel syndrome)    Insomnia    LOW BACK PAIN, CHRONIC 10/01/2009   lung ca dx'd 2019   SBRT comp 12/2018   PARESTHESIA 10/01/2009   Shingles 2006   back   Past Surgical History:  Procedure Laterality Date   ABDOMINAL HYSTERECTOMY  age 30 or 68   APPENDECTOMY  yrs ago   BILATERAL SALPINGOOPHORECTOMY  age 89 or 31   COLONOSCOPY N/A 11/17/2013   Procedure: COLONOSCOPY;  Surgeon: Cleotis Nipper, MD;  Location: [facility];  Service: Endoscopy;  Laterality: N/A;   COLONOSCOPY N/A 07/16/2021   ESOPHAGOGASTRODUODENOSCOPY N/A 11/17/2013   Procedure: ESOPHAGOGASTRODUODENOSCOPY (EGD);  Surgeon: Cleotis Nipper, MD;  Location: [facility];  Service: Endoscopy;  Laterality: N/A;   FINE NEEDLE ASPIRATION  10/15/2021   Procedure: FINE NEEDLE ASPIRATION (FNA) LINEAR;  Surgeon: Garner Nash, DO;  Location: [facility];  Service: Pulmonary;;   HEMORRHOID SURGERY  yrs ago   TONSILLECTOMY  yrs ago   [place] Right 10/15/2021   Procedure: VIDEO BRONCHOSCOPY WITH ENDOBRONCHIAL ULTRASOUND;  Surgeon: Garner Nash, DO;  Location: [facility];  Service: Pulmonary;  Laterality: Right;    reports that she quit smoking about 24 years ago. Her smoking use included cigarettes. She started smoking about 62 years ago. She  smoked an average of .5 packs per day. She has never used smokeless tobacco. She reports that she does not drink alcohol and does not use drugs. family history includes Cancer in her mother and sister. Allergies  Allergen Reactions   Tylenol [Acetaminophen] Swelling and Other (See Comments)    Nightmares, tongue swelling    Current Outpatient Medications on File Prior to Visit  Medication Sig Dispense Refill   Albuterol Sulfate (PROAIR RESPICLICK) 916 (90 Base) MCG/ACT AEPB Inhale 2 puffs into the lungs 4 (four) times daily as needed. 1 each 6   BREO ELLIPTA 200-25 MCG/ACT AEPB Inhale 1 puff into the lungs daily. 180 each 0    clorazepate (TRANXENE) 7.5 MG tablet Take 1 tablet (7.5 mg total) by mouth 2 (two) times daily as needed for anxiety. 180 tablet 3   Dextromethorphan-guaiFENesin (MUCINEX DM) 30-600 MG TB12 Take 1 tablet by mouth 2 (two) times daily. 60 tablet 0   docusate sodium (COLACE) 250 MG capsule Take 250 mg by mouth daily.     doxepin (SINEQUAN) 10 MG capsule TAKE ONE CAPSULE BY MOUTH AT BEDTIME AS NEEDED 90 capsule 0   esomeprazole (NEXIUM) 40 MG capsule TAKE ONE CAPSULE BY MOUTH DAILY 90 capsule 0   folic acid (FOLVITE) 1 MG tablet TAKE ONE TABLET BY MOUTH DAILY 90 tablet 0   gabapentin (NEURONTIN) 600 MG tablet Take 1 tablet (600 mg total) by mouth 4 (four) times daily. 270 tablet 3   levothyroxine (SYNTHROID) 25 MCG tablet TAKE ONE TABLET BY MOUTH DAILY BEFORE BREAKFAST 90 tablet 0   losartan (COZAAR) 25 MG tablet TAKE ONE TABLET BY MOUTH DAILY HIGH BLOOD PRESSURE 90 tablet 0   mirabegron ER (MYRBETRIQ) 50 MG TB24 tablet Take 1 tablet (50 mg total) by mouth daily. 90 tablet 1   oxybutynin (DITROPAN-XL) 5 MG 24 hr tablet TAKE ONE TABLET BY MOUTH IN THE MORNING FOR BLADDER CONTROL 90 tablet 0   Oxycodone HCl 10 MG TABS TAKE ONE TABLET BY MOUTH EVERY 8 HOURS AS NEEDED 90 tablet 0   Plecanatide (TRULANCE) 3 MG TABS Take 1 tablet by mouth daily. 90 tablet 1   polyvinyl alcohol (LIQUIFILM TEARS) 1.4 % ophthalmic solution Place 1 drop into both eyes as needed for dry eyes.     potassium chloride SA (KLOR-CON M) 20 MEQ tablet TAKE ONE TABLET BY MOUTH DAILY 90 tablet 0   pregabalin (LYRICA) 25 MG capsule Take 1 capsule (25 mg total) by mouth 2 (two) times daily. 180 capsule 3   promethazine (PHENERGAN) 25 MG tablet TAKE ONE TABLET BY MOUTH EVERY 8 HOURS AS NEEDED FOR NAUSEA AND VOMITING 30 tablet 1   RELISTOR 150 MG TABS TAKE THREE TABLETS BY MOUTH DAILY 270 tablet 1   sertraline (ZOLOFT) 50 MG tablet TAKE ONE TABLET BY MOUTH EVERY DAY 90 tablet 0   vitamin B-12 (CYANOCOBALAMIN) 50 MCG tablet Take 50 mcg by  mouth daily.     VITAMIN D-1000 MAX ST 25 MCG (1000 UT) tablet Take 1,000 Units by mouth daily.     No current facility-administered medications on file prior to visit.        ROS:  All others reviewed and negative.  Objective        PE:  BP 120/70 (BP Location: Right Arm, Patient Position: Sitting, Cuff Size: Normal)   Pulse 80   Temp 98.5 F (36.9 C) (Oral)   Ht 4\' 11"  (1.499 m)   SpO2 92%   BMI 25.85 kg/m  Constitutional: Pt appears in NAD               HENT: Head: NCAT.                Right Ear: External ear normal.                 Left Ear: External ear normal.                Eyes: . Pupils are equal, round, and reactive to light. Conjunctivae and EOM are normal               Nose: without d/c or deformity               Neck: Neck supple. Gross normal ROM               Cardiovascular: Normal rate and regular rhythm.                 Pulmonary/Chest: Effort normal and breath sounds without rales or wheezing.                Abd:  Soft, NT, ND, + BS, no organomegaly               Neurological: Pt is alert. At baseline orientation, motor grossly intact               Skin: Skin is warm. No rashes, no other new lesions, LE edema - none               Psychiatric: Pt behavior is normal without agitation   Micro: none  Cardiac tracings I have personally interpreted today:  none  Pertinent Radiological findings (summarize): none   Lab Results  Component Value Date   WBC 5.9 11/27/2022   HGB 12.5 11/27/2022   HCT 38.4 11/27/2022   PLT 286 11/27/2022   GLUCOSE 90 11/27/2022   CHOL 234 (H) 01/26/2018   TRIG 112.0 01/26/2018   HDL 81.90 01/26/2018   LDLDIRECT 118.2 03/29/2013   LDLCALC 130 (H) 01/26/2018   ALT 13 12/16/2021   AST 20 12/16/2021   NA 139 11/27/2022   K 3.0 (L) 11/27/2022   CL 104 11/27/2022   CREATININE 0.86 11/27/2022   BUN 9 11/27/2022   CO2 21 (L) 11/27/2022   TSH 2.00 06/30/2022   HGBA1C 5.7 06/21/2019   Assessment/Plan:  ANNELIES COYT is a 87 y.o. White or Caucasian [1] female with  has a past medical history of Anemia, ANXIETY (07/23/2009), Arthritis, Atherosclerosis of aorta ([place]) (40/98/1191), Complication of anesthesia (7 or 8 yrs ago), COPD (chronic obstructive pulmonary disease) ([place]), Coronary atherosclerosis (07/24/2015), DEPRESSION (07/23/2009), DIVERTICULOSIS, COLON (07/23/2009), Dyspnea, GERD (07/23/2009), Headache(784.0) (07/23/2009), Hemorrhoids, HIP PAIN, LEFT (05/16/2010), History of Crohn's disease, HYPERLIPIDEMIA (07/23/2009), HYPERTENSION (07/23/2009), HYPOTHYROIDISM (07/23/2009), IBS (irritable bowel syndrome), Insomnia, LOW BACK PAIN, CHRONIC (10/01/2009), lung ca (dx'd 2019), PARESTHESIA (10/01/2009), and Shingles (2006).  Essential hypertension BP Readings from Last 3 Encounters:  12/04/22 120/70  11/27/22 (!) 157/67  09/26/22 (!) 146/81   Stable, pt to continue medical treatment losartan 25 mg qd   Generalized weakness Etiology unclear, for f/u lab today including cbc and bmp, also for [facility] with nurse, PT, aide, also son to check on possible taking gabapentin and lyrica which in my opinion is high risk given her advanced age   Hypokalemia Also for f/u K today, of note is pt has not been taking lasix 20 mg on her med list in  many weeks, and states she is very good at remaining hydrated  Iron deficiency anemia No recent overt bleeding, for f/u iron with labs  Urinary frequency Minimal symptomatic at worst it seems, but can't r/o uti given the worsening weakness, mentation remains at baseline per son, for UA and culture  Followup: Return in about 3 months (around 03/05/2023) for to Dr Ronnald Ramp.  Cathlean Cower, MD 12/04/2022 7:49 PM [facility]

## 2022-12-04 NOTE — Assessment & Plan Note (Signed)
Minimal symptomatic at worst it seems, but can't r/o uti given the worsening weakness, mentation remains at baseline per son, for UA and culture

## 2022-12-04 NOTE — Assessment & Plan Note (Signed)
Also for f/u K today, of note is pt has not been taking lasix 20 mg on her med list in many weeks, and states she is very good at remaining hydrated

## 2022-12-04 NOTE — Patient Instructions (Signed)
Please continue all other medications as before, except you may wish to question the taking of gabapentin and lyrica at the same time from your pain management provider  Please have the pharmacy call with any other refills you may need.  Please continue your efforts at being more active, low cholesterol diet, and weight control.  Please keep your appointments with your specialists as you may have planned  You will be contacted regarding the referral for: Home Health  Please go to the LAB at the blood drawing area for the tests to be done  You will be contacted by phone if any changes need to be made immediately.  Otherwise, you will receive a letter about your results with an explanation, but please check with MyChart first.  Please remember to sign up for MyChart if you have not done so, as this will be important to you in the future with finding out test results, communicating by private email, and scheduling acute appointments online when needed.  Please make an Appointment to return in 3 months, or sooner if needed, with Dr Ronnald Ramp

## 2022-12-04 NOTE — Assessment & Plan Note (Signed)
BP Readings from Last 3 Encounters:  12/04/22 120/70  11/27/22 (!) 157/67  09/26/22 (!) 146/81   Stable, pt to continue medical treatment losartan 25 mg qd

## 2022-12-04 NOTE — Assessment & Plan Note (Signed)
Etiology unclear, for f/u lab today including cbc and bmp, also for [facility] with nurse, PT, aide, also son to check on possible taking gabapentin and lyrica which in my opinion is high risk given her advanced age

## 2022-12-04 NOTE — Assessment & Plan Note (Signed)
No recent overt bleeding, for f/u iron with labs

## 2022-12-05 ENCOUNTER — Other Ambulatory Visit: Payer: Self-pay | Admitting: Internal Medicine

## 2022-12-05 ENCOUNTER — Encounter: Payer: Self-pay | Admitting: Internal Medicine

## 2022-12-05 LAB — URINALYSIS, ROUTINE W REFLEX MICROSCOPIC
Hgb urine dipstick: NEGATIVE
Ketones, ur: NEGATIVE
Nitrite: NEGATIVE
Specific Gravity, Urine: 1.02 (ref 1.000–1.030)
Total Protein, Urine: NEGATIVE
Urine Glucose: NEGATIVE
Urobilinogen, UA: 0.2 (ref 0.0–1.0)
pH: 6 (ref 5.0–8.0)

## 2022-12-05 LAB — VITAMIN B12: Vitamin B-12: >1500 pg/mL — ABNORMAL HIGH (ref 211–911)

## 2022-12-05 LAB — URINE CULTURE

## 2022-12-05 LAB — FERRITIN: Ferritin: 89.6 ng/mL (ref 10.0–291.0)

## 2022-12-05 LAB — VITAMIN D 25 HYDROXY (VIT D DEFICIENCY, FRACTURES): VITD: 41.83 ng/mL (ref 30.00–100.00)

## 2022-12-05 MED ORDER — CEPHALEXIN 500 MG PO CAPS: 500.0000 mg | ORAL_CAPSULE | Freq: Three times a day (TID) | ORAL | 0 refills | Status: DC

## 2022-12-12 ENCOUNTER — Ambulatory Visit: Payer: Self-pay

## 2022-12-12 NOTE — Patient Outreach (Signed)
  Care Coordination   Initial Visit Note   12/12/2022 Name: Heather [name] MRN: [id number] DOB: Apr 25, 1935  Heather [name] is a 88 y.o. year old female who sees Heather Lima, MD for primary care. I spoke with  Heather [name] by phone today.  What matters to the patients health and wellness today?  Spoke with patient's son, Heather [name]). Primary concern is getting home health services arranged. PCP ordered home health Nursing, PT and bath aid on 12/04/22. Per review of chart, provider office having difficulty finding agency that can provide services to patient. Son reports patent was diagnosed with UTI and started on antibiotics. Ms. [name] lives alone, walks with walker, reports food is not an issue and states patient has meals on wheels.  RNCM attempted to contact patient, but no answer-Son is aware. RNCM will assist with trying to find an agency as needed.   Goals Addressed             This Visit's Progress    Care Coordination Activities       Care Coordination Interventions: Spoke with son regarding patient's condition-per son, Heather [name] has been diagnosed with UTI. Reports patient lives alone, but he assist her with healthcare needs. He reports MD referred to home health, but has not been contacted by an agency yet. Collaboration with referral coordinator Heather [name] to offer assistance as needed. Call placed to patient to discuss health status directly with patient and confirm that she is taking prescribed antibiotic. However patient did not answer phone. Son is aware.       SDOH assessments and interventions completed:  Yes  SDOH Interventions Today    Flowsheet Row Most Recent Value  SDOH Interventions   Food Insecurity Interventions Intervention Not Indicated  Housing Interventions Intervention Not Indicated  Transportation Interventions Intervention Not Indicated  Utilities Interventions Intervention Not Indicated     Care  Coordination Interventions:  Yes, provided   Follow up plan: Follow up call scheduled for 12/19/22    Encounter Outcome:  Pt. Visit Completed   Thea Silversmith, RN, MSN, BSN, [place] Coordinator [phone]

## 2022-12-12 NOTE — Patient Instructions (Addendum)
Visit Information  Thank you for taking time to visit with me today. Please don't hesitate to contact me if I can be of assistance to you.   Following are the goals we discussed today:   Goals Addressed             This Visit's Progress    Care Coordination Activities       Care Coordination Interventions: Spoke with son regarding patient's condition-per son, Mrs. Woodson has been diagnosed with UTI. Reports patient lives alone, but he assist her with healthcare needs. He reports MD referred to home health, but has not been contacted by an agency yet. Collaboration with referral coordinator Ronaldo Miyamoto to offer assistance as needed. Call placed to patient to discuss health status directly with patient and confirm that she is taking prescribed antibiotic. However patient did not answer phone. Son is aware.       Our next appointment is by telephone on 12/19/22 at 1:00 pm  Please call the care guide team at [phone] if you need to cancel or reschedule your appointment.   If you are experiencing a Mental Health or [place] or need someone to talk to, please call the Suicide and Crisis Lifeline: [place], RN, MSN, BSN, [place] [phone]

## 2022-12-18 ENCOUNTER — Other Ambulatory Visit: Payer: Self-pay | Admitting: Internal Medicine

## 2022-12-18 DIAGNOSIS — R531 Weakness: Secondary | ICD-10-CM

## 2022-12-18 DIAGNOSIS — J189 Pneumonia, unspecified organism: Secondary | ICD-10-CM

## 2022-12-18 DIAGNOSIS — Z515 Encounter for palliative care: Secondary | ICD-10-CM

## 2022-12-18 DIAGNOSIS — Z7189 Other specified counseling: Secondary | ICD-10-CM

## 2022-12-19 ENCOUNTER — Ambulatory Visit: Payer: Self-pay

## 2022-12-19 NOTE — Patient Instructions (Addendum)
Visit Information  Thank you for taking time to visit with me today. Please don't hesitate to contact me if I can be of assistance to you.   Following are the goals we discussed today:   Goals Addressed             This Visit's Progress    Care Coordination Activities       Care Coordination Interventions: In-basket message sent to referral coordinator Alcario Drought regarding status of home health referral RNCM contacted [facility]. Spoke with Clotilde Dieter whe states they received referral about 5 minutes prior to [facility] call. She is checking to see if they are able to accept patient for services.        Our next appointment is by telephone on 12/22/22 at 1:30 pm  Please call the care guide team at [phone] if you need to cancel or reschedule your appointment.   If you are experiencing a Mental Health or Behavioral Health Crisis or need someone to talk to, please call the Suicide and Crisis Lifeline: 40  Kathyrn Sheriff, RN, MSN, BSN, CCM [facility] Care Coordinator [phone]

## 2022-12-19 NOTE — Patient Outreach (Signed)
  Care Coordination   Follow Up Visit Note   12/19/2022 Name: Heather [name] MRN: [id number] DOB: [birth year]  Heather [name] is a 88 y.o. year old female who sees Etta Grandchild, MD for primary care. I spoke with  Wendi Maya by phone today.  What matters to the patients health and wellness today?  Spoke with patient's son who reports he has not heard from anyone regarding home health services. He states he continues to fill medication box for patient. Per previous communication on 12/12/22 with referral coordinator, Laverle Patter: she was reaching out to PCP to obtain new orders for [facility] Nurse and PT as [facility] could not provide bath aid. RNCM discussed OT, but reinforced that nursing or PT could request OT if they recommended after assessment. Per review of chart, another referral sent to [facility] on yesterday.     Goals Addressed             This Visit's Progress    Care Coordination Activities       Care Coordination Interventions: In-basket message sent to referral coordinator Alcario Drought regarding status of home health referral RNCM contacted [facility]. Spoke with Clotilde Dieter whe states they received referral about 5 minutes prior to [facility] call. She is checking to see if they are able to accept patient for services. RNCM received call from Ammon, with [facility] stating that can provide services for Ms. Alonso to start tomorrow. RNCM called and updated patient's son, Loleta Books.         SDOH assessments and interventions completed:  No  Care Coordination Interventions:  Yes, provided   Follow up plan:  continue to follow    Encounter Outcome:  Pt. Visit Completed   Kathyrn Sheriff, RN, MSN, BSN, CCM [facility] Care Coordinator [phone]

## 2022-12-20 DIAGNOSIS — E876 Hypokalemia: Secondary | ICD-10-CM | POA: Diagnosis not present

## 2022-12-20 DIAGNOSIS — E538 Deficiency of other specified B group vitamins: Secondary | ICD-10-CM | POA: Diagnosis not present

## 2022-12-20 DIAGNOSIS — Z79891 Long term (current) use of opiate analgesic: Secondary | ICD-10-CM | POA: Diagnosis not present

## 2022-12-20 DIAGNOSIS — J449 Chronic obstructive pulmonary disease, unspecified: Secondary | ICD-10-CM | POA: Diagnosis not present

## 2022-12-20 DIAGNOSIS — K589 Irritable bowel syndrome without diarrhea: Secondary | ICD-10-CM | POA: Diagnosis not present

## 2022-12-20 DIAGNOSIS — I700 Atherosclerosis of aorta: Secondary | ICD-10-CM | POA: Diagnosis not present

## 2022-12-20 DIAGNOSIS — Z9181 History of falling: Secondary | ICD-10-CM | POA: Diagnosis not present

## 2022-12-20 DIAGNOSIS — K219 Gastro-esophageal reflux disease without esophagitis: Secondary | ICD-10-CM | POA: Diagnosis not present

## 2022-12-20 DIAGNOSIS — Z7951 Long term (current) use of inhaled steroids: Secondary | ICD-10-CM | POA: Diagnosis not present

## 2022-12-20 DIAGNOSIS — F32A Depression, unspecified: Secondary | ICD-10-CM | POA: Diagnosis not present

## 2022-12-20 DIAGNOSIS — E039 Hypothyroidism, unspecified: Secondary | ICD-10-CM | POA: Diagnosis not present

## 2022-12-20 DIAGNOSIS — M1990 Unspecified osteoarthritis, unspecified site: Secondary | ICD-10-CM | POA: Diagnosis not present

## 2022-12-20 DIAGNOSIS — I2510 Atherosclerotic heart disease of native coronary artery without angina pectoris: Secondary | ICD-10-CM | POA: Diagnosis not present

## 2022-12-20 DIAGNOSIS — G8929 Other chronic pain: Secondary | ICD-10-CM | POA: Diagnosis not present

## 2022-12-20 DIAGNOSIS — I10 Essential (primary) hypertension: Secondary | ICD-10-CM | POA: Diagnosis not present

## 2022-12-20 DIAGNOSIS — Z85118 Personal history of other malignant neoplasm of bronchus and lung: Secondary | ICD-10-CM | POA: Diagnosis not present

## 2022-12-20 DIAGNOSIS — K5090 Crohn's disease, unspecified, without complications: Secondary | ICD-10-CM | POA: Diagnosis not present

## 2022-12-20 DIAGNOSIS — R350 Frequency of micturition: Secondary | ICD-10-CM | POA: Diagnosis not present

## 2022-12-20 DIAGNOSIS — K5790 Diverticulosis of intestine, part unspecified, without perforation or abscess without bleeding: Secondary | ICD-10-CM | POA: Diagnosis not present

## 2022-12-20 DIAGNOSIS — E785 Hyperlipidemia, unspecified: Secondary | ICD-10-CM | POA: Diagnosis not present

## 2022-12-20 DIAGNOSIS — D509 Iron deficiency anemia, unspecified: Secondary | ICD-10-CM | POA: Diagnosis not present

## 2022-12-20 DIAGNOSIS — Z87891 Personal history of nicotine dependence: Secondary | ICD-10-CM | POA: Diagnosis not present

## 2022-12-20 DIAGNOSIS — G4700 Insomnia, unspecified: Secondary | ICD-10-CM | POA: Diagnosis not present

## 2022-12-20 DIAGNOSIS — E559 Vitamin D deficiency, unspecified: Secondary | ICD-10-CM | POA: Diagnosis not present

## 2022-12-21 DIAGNOSIS — F32A Depression, unspecified: Secondary | ICD-10-CM | POA: Diagnosis not present

## 2022-12-21 DIAGNOSIS — R350 Frequency of micturition: Secondary | ICD-10-CM | POA: Diagnosis not present

## 2022-12-21 DIAGNOSIS — E785 Hyperlipidemia, unspecified: Secondary | ICD-10-CM | POA: Diagnosis not present

## 2022-12-21 DIAGNOSIS — D509 Iron deficiency anemia, unspecified: Secondary | ICD-10-CM | POA: Diagnosis not present

## 2022-12-21 DIAGNOSIS — Z85118 Personal history of other malignant neoplasm of bronchus and lung: Secondary | ICD-10-CM | POA: Diagnosis not present

## 2022-12-21 DIAGNOSIS — E876 Hypokalemia: Secondary | ICD-10-CM | POA: Diagnosis not present

## 2022-12-21 DIAGNOSIS — Z87891 Personal history of nicotine dependence: Secondary | ICD-10-CM | POA: Diagnosis not present

## 2022-12-21 DIAGNOSIS — Z79891 Long term (current) use of opiate analgesic: Secondary | ICD-10-CM | POA: Diagnosis not present

## 2022-12-21 DIAGNOSIS — E538 Deficiency of other specified B group vitamins: Secondary | ICD-10-CM | POA: Diagnosis not present

## 2022-12-21 DIAGNOSIS — E559 Vitamin D deficiency, unspecified: Secondary | ICD-10-CM | POA: Diagnosis not present

## 2022-12-21 DIAGNOSIS — J449 Chronic obstructive pulmonary disease, unspecified: Secondary | ICD-10-CM | POA: Diagnosis not present

## 2022-12-21 DIAGNOSIS — G4700 Insomnia, unspecified: Secondary | ICD-10-CM | POA: Diagnosis not present

## 2022-12-21 DIAGNOSIS — I10 Essential (primary) hypertension: Secondary | ICD-10-CM | POA: Diagnosis not present

## 2022-12-21 DIAGNOSIS — K5090 Crohn's disease, unspecified, without complications: Secondary | ICD-10-CM | POA: Diagnosis not present

## 2022-12-21 DIAGNOSIS — G8929 Other chronic pain: Secondary | ICD-10-CM | POA: Diagnosis not present

## 2022-12-21 DIAGNOSIS — K5790 Diverticulosis of intestine, part unspecified, without perforation or abscess without bleeding: Secondary | ICD-10-CM | POA: Diagnosis not present

## 2022-12-21 DIAGNOSIS — M1990 Unspecified osteoarthritis, unspecified site: Secondary | ICD-10-CM | POA: Diagnosis not present

## 2022-12-21 DIAGNOSIS — K219 Gastro-esophageal reflux disease without esophagitis: Secondary | ICD-10-CM | POA: Diagnosis not present

## 2022-12-21 DIAGNOSIS — K589 Irritable bowel syndrome without diarrhea: Secondary | ICD-10-CM | POA: Diagnosis not present

## 2022-12-21 DIAGNOSIS — Z7951 Long term (current) use of inhaled steroids: Secondary | ICD-10-CM | POA: Diagnosis not present

## 2022-12-21 DIAGNOSIS — I2510 Atherosclerotic heart disease of native coronary artery without angina pectoris: Secondary | ICD-10-CM | POA: Diagnosis not present

## 2022-12-21 DIAGNOSIS — Z9181 History of falling: Secondary | ICD-10-CM | POA: Diagnosis not present

## 2022-12-21 DIAGNOSIS — I700 Atherosclerosis of aorta: Secondary | ICD-10-CM | POA: Diagnosis not present

## 2022-12-21 DIAGNOSIS — E039 Hypothyroidism, unspecified: Secondary | ICD-10-CM | POA: Diagnosis not present

## 2022-12-22 ENCOUNTER — Ambulatory Visit: Payer: Self-pay

## 2022-12-22 ENCOUNTER — Other Ambulatory Visit: Payer: Self-pay | Admitting: Internal Medicine

## 2022-12-22 NOTE — Patient Outreach (Signed)
  Care Coordination   Follow Up Visit Note   12/22/2022 Name: TOLUWANIMI RADEBAUGH MRN: [id number] DOB: [birth year]  Heather [name] is a 88 y.o. year old female who sees Etta Grandchild, MD for primary care. I spoke with  Wendi Maya by phone today.  What matters to the patients health and wellness today?  RNCM called to follow up regarding home health services.    Goals Addressed             This Visit's Progress    Care Coordination Activities       Care Coordination Interventions: Confirmed with patient's son that home health has started care: HHRN and PT therapy        SDOH assessments and interventions completed:  No  Care Coordination Interventions:  Yes, provided   Follow up plan: Follow up call scheduled for 01/12/23    Encounter Outcome:  Pt. Visit Completed   Kathyrn Sheriff, RN, MSN, BSN, CCM [facility] Care Coordinator [phone]

## 2022-12-22 NOTE — Patient Instructions (Signed)
Visit Information  Thank you for taking time to visit with me today. Please don't hesitate to contact me if I can be of assistance to you.   Following are the goals we discussed today:   Goals Addressed             This Visit's Progress    Care Coordination Activities       Care Coordination Interventions: Confirmed with patient's son that home health has started care: HHRN and PT therapy        Our next appointment is by telephone on 01/12/23 at 9:30 am  Please call the care guide team at [phone] if you need to cancel or reschedule your appointment.   If you are experiencing a Mental Health or Behavioral Health Crisis or need someone to talk to, please call the Suicide and Crisis Lifeline: 38  Kathyrn Sheriff, RN, MSN, BSN, CCM [facility] Care Coordinator [phone]

## 2022-12-24 DIAGNOSIS — F32A Depression, unspecified: Secondary | ICD-10-CM | POA: Diagnosis not present

## 2022-12-24 DIAGNOSIS — E538 Deficiency of other specified B group vitamins: Secondary | ICD-10-CM | POA: Diagnosis not present

## 2022-12-24 DIAGNOSIS — I2510 Atherosclerotic heart disease of native coronary artery without angina pectoris: Secondary | ICD-10-CM | POA: Diagnosis not present

## 2022-12-24 DIAGNOSIS — K5790 Diverticulosis of intestine, part unspecified, without perforation or abscess without bleeding: Secondary | ICD-10-CM | POA: Diagnosis not present

## 2022-12-24 DIAGNOSIS — I10 Essential (primary) hypertension: Secondary | ICD-10-CM | POA: Diagnosis not present

## 2022-12-24 DIAGNOSIS — G4700 Insomnia, unspecified: Secondary | ICD-10-CM | POA: Diagnosis not present

## 2022-12-24 DIAGNOSIS — E785 Hyperlipidemia, unspecified: Secondary | ICD-10-CM | POA: Diagnosis not present

## 2022-12-24 DIAGNOSIS — G8929 Other chronic pain: Secondary | ICD-10-CM | POA: Diagnosis not present

## 2022-12-24 DIAGNOSIS — Z87891 Personal history of nicotine dependence: Secondary | ICD-10-CM | POA: Diagnosis not present

## 2022-12-24 DIAGNOSIS — E876 Hypokalemia: Secondary | ICD-10-CM | POA: Diagnosis not present

## 2022-12-24 DIAGNOSIS — D509 Iron deficiency anemia, unspecified: Secondary | ICD-10-CM | POA: Diagnosis not present

## 2022-12-24 DIAGNOSIS — Z79891 Long term (current) use of opiate analgesic: Secondary | ICD-10-CM | POA: Diagnosis not present

## 2022-12-24 DIAGNOSIS — K5090 Crohn's disease, unspecified, without complications: Secondary | ICD-10-CM | POA: Diagnosis not present

## 2022-12-24 DIAGNOSIS — Z9181 History of falling: Secondary | ICD-10-CM | POA: Diagnosis not present

## 2022-12-24 DIAGNOSIS — Z7951 Long term (current) use of inhaled steroids: Secondary | ICD-10-CM | POA: Diagnosis not present

## 2022-12-24 DIAGNOSIS — J449 Chronic obstructive pulmonary disease, unspecified: Secondary | ICD-10-CM | POA: Diagnosis not present

## 2022-12-24 DIAGNOSIS — K589 Irritable bowel syndrome without diarrhea: Secondary | ICD-10-CM | POA: Diagnosis not present

## 2022-12-24 DIAGNOSIS — E039 Hypothyroidism, unspecified: Secondary | ICD-10-CM | POA: Diagnosis not present

## 2022-12-24 DIAGNOSIS — K219 Gastro-esophageal reflux disease without esophagitis: Secondary | ICD-10-CM | POA: Diagnosis not present

## 2022-12-24 DIAGNOSIS — R350 Frequency of micturition: Secondary | ICD-10-CM | POA: Diagnosis not present

## 2022-12-24 DIAGNOSIS — M1990 Unspecified osteoarthritis, unspecified site: Secondary | ICD-10-CM | POA: Diagnosis not present

## 2022-12-24 DIAGNOSIS — Z85118 Personal history of other malignant neoplasm of bronchus and lung: Secondary | ICD-10-CM | POA: Diagnosis not present

## 2022-12-24 DIAGNOSIS — I700 Atherosclerosis of aorta: Secondary | ICD-10-CM | POA: Diagnosis not present

## 2022-12-24 DIAGNOSIS — E559 Vitamin D deficiency, unspecified: Secondary | ICD-10-CM | POA: Diagnosis not present

## 2022-12-25 DIAGNOSIS — Z85118 Personal history of other malignant neoplasm of bronchus and lung: Secondary | ICD-10-CM | POA: Diagnosis not present

## 2022-12-25 DIAGNOSIS — E039 Hypothyroidism, unspecified: Secondary | ICD-10-CM | POA: Diagnosis not present

## 2022-12-25 DIAGNOSIS — Z79891 Long term (current) use of opiate analgesic: Secondary | ICD-10-CM | POA: Diagnosis not present

## 2022-12-25 DIAGNOSIS — M1990 Unspecified osteoarthritis, unspecified site: Secondary | ICD-10-CM | POA: Diagnosis not present

## 2022-12-25 DIAGNOSIS — G8929 Other chronic pain: Secondary | ICD-10-CM | POA: Diagnosis not present

## 2022-12-25 DIAGNOSIS — D509 Iron deficiency anemia, unspecified: Secondary | ICD-10-CM | POA: Diagnosis not present

## 2022-12-25 DIAGNOSIS — E538 Deficiency of other specified B group vitamins: Secondary | ICD-10-CM | POA: Diagnosis not present

## 2022-12-25 DIAGNOSIS — Z9181 History of falling: Secondary | ICD-10-CM | POA: Diagnosis not present

## 2022-12-25 DIAGNOSIS — R350 Frequency of micturition: Secondary | ICD-10-CM | POA: Diagnosis not present

## 2022-12-25 DIAGNOSIS — I10 Essential (primary) hypertension: Secondary | ICD-10-CM | POA: Diagnosis not present

## 2022-12-25 DIAGNOSIS — I700 Atherosclerosis of aorta: Secondary | ICD-10-CM | POA: Diagnosis not present

## 2022-12-25 DIAGNOSIS — E876 Hypokalemia: Secondary | ICD-10-CM | POA: Diagnosis not present

## 2022-12-25 DIAGNOSIS — E785 Hyperlipidemia, unspecified: Secondary | ICD-10-CM | POA: Diagnosis not present

## 2022-12-25 DIAGNOSIS — K219 Gastro-esophageal reflux disease without esophagitis: Secondary | ICD-10-CM | POA: Diagnosis not present

## 2022-12-25 DIAGNOSIS — K5790 Diverticulosis of intestine, part unspecified, without perforation or abscess without bleeding: Secondary | ICD-10-CM | POA: Diagnosis not present

## 2022-12-25 DIAGNOSIS — Z7951 Long term (current) use of inhaled steroids: Secondary | ICD-10-CM | POA: Diagnosis not present

## 2022-12-25 DIAGNOSIS — E559 Vitamin D deficiency, unspecified: Secondary | ICD-10-CM | POA: Diagnosis not present

## 2022-12-25 DIAGNOSIS — K5090 Crohn's disease, unspecified, without complications: Secondary | ICD-10-CM | POA: Diagnosis not present

## 2022-12-25 DIAGNOSIS — Z87891 Personal history of nicotine dependence: Secondary | ICD-10-CM | POA: Diagnosis not present

## 2022-12-25 DIAGNOSIS — K589 Irritable bowel syndrome without diarrhea: Secondary | ICD-10-CM | POA: Diagnosis not present

## 2022-12-25 DIAGNOSIS — I2510 Atherosclerotic heart disease of native coronary artery without angina pectoris: Secondary | ICD-10-CM | POA: Diagnosis not present

## 2022-12-25 DIAGNOSIS — G4700 Insomnia, unspecified: Secondary | ICD-10-CM | POA: Diagnosis not present

## 2022-12-25 DIAGNOSIS — J449 Chronic obstructive pulmonary disease, unspecified: Secondary | ICD-10-CM | POA: Diagnosis not present

## 2022-12-25 DIAGNOSIS — F32A Depression, unspecified: Secondary | ICD-10-CM | POA: Diagnosis not present

## 2022-12-26 DIAGNOSIS — E538 Deficiency of other specified B group vitamins: Secondary | ICD-10-CM | POA: Diagnosis not present

## 2022-12-26 DIAGNOSIS — K589 Irritable bowel syndrome without diarrhea: Secondary | ICD-10-CM | POA: Diagnosis not present

## 2022-12-26 DIAGNOSIS — Z85118 Personal history of other malignant neoplasm of bronchus and lung: Secondary | ICD-10-CM | POA: Diagnosis not present

## 2022-12-26 DIAGNOSIS — Z87891 Personal history of nicotine dependence: Secondary | ICD-10-CM | POA: Diagnosis not present

## 2022-12-26 DIAGNOSIS — D509 Iron deficiency anemia, unspecified: Secondary | ICD-10-CM | POA: Diagnosis not present

## 2022-12-26 DIAGNOSIS — G8929 Other chronic pain: Secondary | ICD-10-CM | POA: Diagnosis not present

## 2022-12-26 DIAGNOSIS — G4700 Insomnia, unspecified: Secondary | ICD-10-CM | POA: Diagnosis not present

## 2022-12-26 DIAGNOSIS — R350 Frequency of micturition: Secondary | ICD-10-CM | POA: Diagnosis not present

## 2022-12-26 DIAGNOSIS — F32A Depression, unspecified: Secondary | ICD-10-CM | POA: Diagnosis not present

## 2022-12-26 DIAGNOSIS — Z9181 History of falling: Secondary | ICD-10-CM | POA: Diagnosis not present

## 2022-12-26 DIAGNOSIS — E876 Hypokalemia: Secondary | ICD-10-CM | POA: Diagnosis not present

## 2022-12-26 DIAGNOSIS — E039 Hypothyroidism, unspecified: Secondary | ICD-10-CM | POA: Diagnosis not present

## 2022-12-26 DIAGNOSIS — I10 Essential (primary) hypertension: Secondary | ICD-10-CM | POA: Diagnosis not present

## 2022-12-26 DIAGNOSIS — J449 Chronic obstructive pulmonary disease, unspecified: Secondary | ICD-10-CM | POA: Diagnosis not present

## 2022-12-26 DIAGNOSIS — K219 Gastro-esophageal reflux disease without esophagitis: Secondary | ICD-10-CM | POA: Diagnosis not present

## 2022-12-26 DIAGNOSIS — Z79891 Long term (current) use of opiate analgesic: Secondary | ICD-10-CM | POA: Diagnosis not present

## 2022-12-26 DIAGNOSIS — M1990 Unspecified osteoarthritis, unspecified site: Secondary | ICD-10-CM | POA: Diagnosis not present

## 2022-12-26 DIAGNOSIS — E785 Hyperlipidemia, unspecified: Secondary | ICD-10-CM | POA: Diagnosis not present

## 2022-12-26 DIAGNOSIS — Z7951 Long term (current) use of inhaled steroids: Secondary | ICD-10-CM | POA: Diagnosis not present

## 2022-12-26 DIAGNOSIS — I700 Atherosclerosis of aorta: Secondary | ICD-10-CM | POA: Diagnosis not present

## 2022-12-26 DIAGNOSIS — E559 Vitamin D deficiency, unspecified: Secondary | ICD-10-CM | POA: Diagnosis not present

## 2022-12-26 DIAGNOSIS — K5790 Diverticulosis of intestine, part unspecified, without perforation or abscess without bleeding: Secondary | ICD-10-CM | POA: Diagnosis not present

## 2022-12-26 DIAGNOSIS — I2510 Atherosclerotic heart disease of native coronary artery without angina pectoris: Secondary | ICD-10-CM | POA: Diagnosis not present

## 2022-12-26 DIAGNOSIS — K5090 Crohn's disease, unspecified, without complications: Secondary | ICD-10-CM | POA: Diagnosis not present

## 2022-12-29 ENCOUNTER — Telehealth: Payer: Self-pay | Admitting: Internal Medicine

## 2022-12-29 DIAGNOSIS — H40023 Open angle with borderline findings, high risk, bilateral: Secondary | ICD-10-CM | POA: Diagnosis not present

## 2022-12-29 DIAGNOSIS — R300 Dysuria: Secondary | ICD-10-CM

## 2022-12-29 NOTE — Telephone Encounter (Signed)
Patients son called and said that patient saw Dr Jonny Ruiz for a UTI 12/04/22 and that she's still having issues from it and wanted to know if he can prescribe 1 more round of antibiotic for it. Call back is [phone]

## 2022-12-30 ENCOUNTER — Other Ambulatory Visit: Payer: Self-pay | Admitting: Internal Medicine

## 2022-12-30 ENCOUNTER — Encounter
Payer: Medicare Other | Attending: Physical Medicine and Rehabilitation | Admitting: Physical Medicine and Rehabilitation

## 2022-12-30 ENCOUNTER — Encounter: Payer: Self-pay | Admitting: Physical Medicine and Rehabilitation

## 2022-12-30 VITALS — BP 122/75 | HR 94 | Temp 98.7°F | Ht 59.0 in

## 2022-12-30 DIAGNOSIS — M48061 Spinal stenosis, lumbar region without neurogenic claudication: Secondary | ICD-10-CM | POA: Diagnosis not present

## 2022-12-30 DIAGNOSIS — N390 Urinary tract infection, site not specified: Secondary | ICD-10-CM | POA: Diagnosis not present

## 2022-12-30 DIAGNOSIS — M1612 Unilateral primary osteoarthritis, left hip: Secondary | ICD-10-CM

## 2022-12-30 DIAGNOSIS — G4701 Insomnia due to medical condition: Secondary | ICD-10-CM

## 2022-12-30 MED ORDER — LIDOCAINE HCL 1 % IJ SOLN
5.0000 mL | Freq: Once | INTRAMUSCULAR | Status: AC
  Administered 2022-12-30: 5 mL via INTRADERMAL

## 2022-12-30 MED ORDER — BETAMETHASONE SOD PHOS & ACET 6 (3-3) MG/ML IJ SUSP
12.0000 mg | Freq: Once | INTRAMUSCULAR | Status: AC
  Administered 2022-12-30: 12 mg via INTRAMUSCULAR

## 2022-12-30 MED ORDER — PREGABALIN 50 MG PO CAPS: 50.0000 mg | ORAL_CAPSULE | Freq: Three times a day (TID) | ORAL | 3 refills | Status: DC

## 2022-12-30 MED ORDER — CEPHALEXIN 500 MG PO CAPS: 500.0000 mg | ORAL_CAPSULE | Freq: Two times a day (BID) | ORAL | 0 refills | Status: DC

## 2022-12-30 NOTE — Patient Instructions (Signed)
Lyrica 50mg  BID  Gabapentin wean: 600 three times per day, after 3 days decrease to twice per day, after 3 days decrease to once at night, after three days stop gabapentin.

## 2022-12-30 NOTE — Progress Notes (Signed)
Trigger Point Injection  Indication: Hip and low back myofascial pain not relieved by medication management and other conservative care.  Informed consent was obtained after describing risk and benefits of the procedure with the patient, this includes bleeding, bruising, infection and medication side effects.  The patient wishes to proceed and has given written consent.  The patient was placed in a seated position.  The area of pain was marked and prepped with Betadine.  It was entered with a 25-gauge 1/2 inch needle and a total of 5 mL of 1% lidocaine and normal saline was injected into a total of 4 trigger points, after negative draw back for blood.  The patient tolerated the procedure well.  Post procedure instructions were given.

## 2022-12-30 NOTE — Progress Notes (Signed)
Subjective:    Patient ID: Heather [name], female    DOB: December 26, 1934, 88 y.o.   MRN: [id number]  HPI   Heather [name] is an 88 year old woman who presents for f/u of of lumbar spinal stenosis and left hip bursitis  1) Lumbar spinal stenosis:  Her pain is present in bilateral feet and has been present since January. Average and current pain are currently rated as 8/10. The pain is constant and burning. Pain is worst with ambulation and relieved with rest. She also has left hip pain. She uses a cane and a walker and needs assistance with household activities.  -She has been taking oxycodone 5mg  and it does not touch the pain. Tramadol was not helping before.  -currently severe.  -aching in the low back.  -taking gabapentin and Lyrica and was told to discuss this with me  2) Left hip pain: -this hurts her all the time.  -she had about 2 weeks of benefit from our last steroid injection -she asks about starting Darvocet which has benefited her in the past.  -hurting severely -she cannot bear weight -she has not tried Gabapentin before.   3) Constipation: -She has a bowel movement every other day -If she does not have this, she takes sennakot.  -this is still a big problem  4) Cancer:  -She feels a lot of fatigue -She has scans scheduled every 6 months.   5) Emesis: -since Friday  6) Insomnia: -Never sleeps until after midnight  He mentioned that Trazodone 150mg  I had prescribed in the past for insomnia was too much for her and sedated her. He says that she is currently taking Doxepin for sleep and she has been waking up after 2 hours after taking this. He requested that if she doesn't mention her sleep, not to prescribe anything. -she was up every two hours last night.  -she cannot sleep on her left side.   9) 9 teeth pulled: -worse than the back pain.   19) UTI -son says he called PCP to let them know she lost her medicine but has not heard back from them yet and she  continues to have symptoms  PDMP reviewed. She was last prescribed Lyrica 25mg  BID and Tramadol 50mg  BID on 8/02 and 7/29 respectively. Last scripts prior to that were 6 months ago. She asks whether the dosage of the Tramadol can be increased.   8/18 note with Dr. Scot Dock reviewed. During this in-office visit, she described burning pain and paresthesias in both feet. He felt her symptoms were most consistent with neuropathy. She has a history of back surgery and Dr. Scot Dock felt her pain could be referred from degenerative disc disease. ABIs were 100% on his testing.   Pain Inventory Average Pain 5 Pain Right Now 9 My pain is aching  In the last 24 hours, has pain interfered with the following? General activity 5 Relation with others 5 Enjoyment of life 5 What TIME of day is your pain at its worst? morning , daytime, evening, and night Sleep (in general) Poor  Pain is worse with: bending Pain improves with: medication Relief from Meds: 1  use a cane use a walker  I need assistance with the following:  household duties and shopping  dizziness anxiety  Colonoscopy      Any changes since last visit?  yes Primary care Dr.Jones    Family History  Problem Relation Age of Onset   Cancer Mother  Unknown type   Cancer Sister        Breast   Clotting disorder Neg Hx    Social History   Socioeconomic History   Marital status: Widowed    Spouse name: Not on file   Number of children: 1   Years of education: Not on file   Highest education level: Not on file  Occupational History   Not on file  Tobacco Use   Smoking status: Former    Packs/day: 0.50    Types: Cigarettes    Start date: 07/16/1960    Quit date: 07/16/1998    Years since quitting: 24.4   Smokeless tobacco: Never  Vaping Use   Vaping Use: Never used  Substance and Sexual Activity   Alcohol use: No    Alcohol/week: 0.0 standard drinks of alcohol   Drug use: No   Sexual activity: Never  Other  Topics Concern   Not on file  Social History Narrative   Widowed.  Lives alone in her own home.  Ambulates with a cane/walker when needed.   Social Determinants of Health   Financial Resource Strain: Low Risk  (06/25/2022)   Overall Financial Resource Strain (CARDIA)    Difficulty of Paying Living Expenses: Not hard at all  Food Insecurity: No Food Insecurity (12/12/2022)   Hunger Vital Sign    Worried About Running Out of Food in the Last Year: Never true    Ran Out of Food in the Last Year: Never true  Transportation Needs: No Transportation Needs (12/12/2022)   PRAPARE - Administrator, Civil Service (Medical): No    Lack of Transportation (Non-Medical): No  Physical Activity: Inactive (06/25/2022)   Exercise Vital Sign    Days of Exercise per Week: 0 days    Minutes of Exercise per Session: 0 min  Stress: No Stress Concern Present (06/25/2022)   [employer] of Occupational Health - Occupational Stress Questionnaire    Feeling of Stress : Not at all  Social Connections: Socially Isolated (06/25/2022)   Social Connection and Isolation Panel [NHANES]    Frequency of Communication with Friends and Family: More than three times a week    Frequency of Social Gatherings with Friends and Family: More than three times a week    Attends Religious Services: Never    Database administrator or Organizations: No    Attends Banker Meetings: Never    Marital Status: Widowed   Past Surgical History:  Procedure Laterality Date   ABDOMINAL HYSTERECTOMY  age 88 or 8   APPENDECTOMY  yrs ago   BILATERAL SALPINGOOPHORECTOMY  age 46 or 12   COLONOSCOPY N/A 11/17/2013   Procedure: COLONOSCOPY;  Surgeon: Florencia Reasons, MD;  Location: [facility];  Service: Endoscopy;  Laterality: N/A;   COLONOSCOPY N/A 07/16/2021   ESOPHAGOGASTRODUODENOSCOPY N/A 11/17/2013   Procedure: ESOPHAGOGASTRODUODENOSCOPY (EGD);  Surgeon: Florencia Reasons, MD;  Location: [facility];  Service:  Endoscopy;  Laterality: N/A;   FINE NEEDLE ASPIRATION  10/15/2021   Procedure: FINE NEEDLE ASPIRATION (FNA) LINEAR;  Surgeon: Josephine Igo, DO;  Location: [facility];  Service: Pulmonary;;   HEMORRHOID SURGERY  yrs ago   TONSILLECTOMY  yrs ago   VIDEO BRONCHOSCOPY WITH ENDOBRONCHIAL ULTRASOUND Right 10/15/2021   Procedure: VIDEO BRONCHOSCOPY WITH ENDOBRONCHIAL ULTRASOUND;  Surgeon: Josephine Igo, DO;  Location: [facility];  Service: Pulmonary;  Laterality: Right;   Past Medical History:  Diagnosis Date   Anemia  ANXIETY 07/23/2009   Arthritis    Atherosclerosis of aorta ([place]) 16/09/9603   Complication of anesthesia 7 or 8 yrs ago   woke up during colonscopy   COPD (chronic obstructive pulmonary disease) ([place])    Coronary atherosclerosis 07/24/2015   DEPRESSION 07/23/2009   DIVERTICULOSIS, COLON 07/23/2009   Dyspnea    GERD 07/23/2009   Headache(784.0) 07/23/2009   occasional   Hemorrhoids    HIP PAIN, LEFT 05/16/2010   History of Crohn's disease    HYPERLIPIDEMIA 07/23/2009   HYPERTENSION 07/23/2009   HYPOTHYROIDISM 07/23/2009   IBS (irritable bowel syndrome)    Insomnia    LOW BACK PAIN, CHRONIC 10/01/2009   lung ca dx'd 2019   SBRT comp 12/2018   PARESTHESIA 10/01/2009   Shingles 2006   back   BP 122/75   Pulse 94   Temp 98.7 F (37.1 C)   Ht 4\' 11"  (1.499 m)   SpO2 96%   BMI 25.85 kg/m   Opioid Risk Score:   Fall Risk Score:  `1  Depression screen PHQ 2/9     12/30/2022    1:22 PM 12/04/2022    2:44 PM 07/22/2022    2:23 PM 06/25/2022    3:04 PM 06/24/2022   11:41 AM 12/06/2021    2:05 PM 08/02/2021    3:34 PM  Depression screen PHQ 2/9  Decreased Interest 1 0 3 0 0 0 1  Down, Depressed, Hopeless  0 3 0 0 0 1  PHQ - 2 Score 1 0 6 0 0 0 2  Altered sleeping  0 3      Tired, decreased energy  0 3      Change in appetite  0 1      Feeling bad or failure about yourself   0 0      Trouble concentrating  0 2      Moving slowly or  fidgety/restless  0 1      Suicidal thoughts  0 0      PHQ-9 Score  0 16       Review of Systems  Constitutional: Negative.   HENT:         Having problems after 9 teeth extracted  Eyes: Negative.   Respiratory: Negative.    Cardiovascular: Negative.   Gastrointestinal: Negative.   Endocrine: Negative.   Genitourinary: Negative.   Musculoskeletal:  Positive for arthralgias and myalgias.       Left hip pain , hurting all over body .   Allergic/Immunologic: Negative.   Neurological:        Burning type pain  Hematological: Negative.   Psychiatric/Behavioral:  The patient is nervous/anxious.   All other systems reviewed and are negative.      Objective:   Physical Exam Gen: no distress, normal appearing HEENT: oral mucosa pink and moist, NCAT Cardio: Reg rate Chest: normal effort, normal rate of breathing Abd: soft, non-distended Ext: no edema Psych: pleasant, normal affect Skin: intact Neuro: Alert and oriented x3 Musculoskeletal:  Wheelchair bound    Assessment & Plan:  Mrs. Dobbins is an 88 year old woman who presents for follow-up of bilateral painful paraesthesias in her feet that started in January.  1) Chronic Pain Syndrome secondary to lumbar spinal stenosis -discussed wean off gabapentin and increase in Lyrica to 50mg  BID -Discussed following foods that may reduce pain: 1) Ginger (especially studied for arthritis)- reduce leukotriene production to decrease inflammation 2) Blueberries- high in phytonutrients that decrease inflammation 3) Salmon-  marine omega-3s reduce joint swelling and pain 4) Pumpkin seeds- reduce inflammation 5) dark chocolate- reduces inflammation 6) turmeric- reduces inflammation 7) tart cherries - reduce pain and stiffness 8) extra virgin olive oil - its compound olecanthal helps to block prostaglandins  9) chili peppers- can be eaten or applied topically via capsaicin 10) mint- helpful for headache, muscle aches, joint pain, and  itching 11) garlic- reduces inflammation  Link to further information on diet for chronic pain: [email or web address]   -MRI 01/15/2010 reviewed and shows mild disc bulging and facet hypertrophy at L4-L5 with resulting mild lateral recess stenosis bilaterally and asymmetric disc bulging on the left and bilateral face hypertrophy at L5-1 which is contributing to mild left foraminal stenosis without definite nerve root encroachment.   -Left hip XR from 02/03/20 reviewed and is normal.   2) Insomnia: -Her son reports insomnia. She was too drowsy with the Trazodone in the past. She is currently on Doxepin. She still wakes every 2 hours with this.  -melatonin did not help 5mg .  -recommended tart cherry juice -amitriptyline 10mg  prescribed. Advised that this can cause a lot of grogginess in the morning so we will start with the lowest dose, please let me know in about 3 days if not effective enough. Advised that this medicine can also help with her neuropathic pain.  -She asks that I also send her Clorazepate 7.5mg  BID PRN for anxiety. She was on this dose prior to her doctor retiring and it really helped with her anxiety.    3) New-onset cancer: -answered her questions regarding the types of treatment she had -Recommended ketogenic diet -Discussed cancer associated fatigue -Discussed home therapy as an option if she would like  4) Left sided greater trochanteric bursitis: -discussed risks and benefits of steroid injection -apply blue emu oil Trochanteric bursa injection With or without ultrasound guidance -she was taken off the tramadol and started on oxycodone TID PRN which is not also helping much -start gabapentin 100mg  TID. Advised her to give me a call in 3 days if this in not effective.   Indication Trochanteric bursitis. Exam has tenderness over the greater trochanter of the hip. Pain has not responded to  conservative care such as exercise therapy and oral medications. Pain interferes with sleep or with mobility Informed consent was obtained after describing risks and benefits of the procedure with the patient these include bleeding bruising and infection. Patient has signed written consent form. Patient placed in a lateral decubitus position with the affected hip superior. Point of maximal pain was palpated marked and prepped with Betadine and entered with a needle to bone contact. Needle slightly withdrawn then 6mg  of betamethasone with 4 cc 1% lidocaine were injected. Patient tolerated procedure well. Post procedure instructions given.   5) opioid induced constipation: -take senna nightly -discussed goal of daily BM -Provided list of following foods that help with constipation and highlighted a few: 1) prunes- contain high amounts of fiber.  2) apples- has a form of dietary fiber called pectin that accelerates stool movement and increases beneficial gut bacteria 3) pears- in addition to fiber, also high in fructose and sorbitol which have laxative effect 4) figs- contain an enzyme ficin which helps to speed colonic transit 5) kiwis- contain an enzyme actinidin that improves gut motility and reduces constipation 6) oranges- rich in pectin (like apples) 7) grapefruits- contain a flavanol naringenin which has a laxative effect 8) vegetables- rich in fiber and also great sources of folate, vitamin C, and K  9) artichoke- high in inulin, prebiotic great for the microbiome 10) chicory- increases stool frequency and softness (can be added to coffee) 11) rhubarb- laxative effect 12) sweet potato- high fiber 13) beans, peas, and lentils- contain both soluble and insoluble fiber 14) chia seeds- improves intestinal health and gut flora 15) flaxseeds- laxative effect 16) whole grain rye bread- high in fiber 17) oat bran- high in soluble and insoluble fiber 18) kefir- softens stools -recommended to try  at least one of these foods every day.  -drink 6-8 glasses of water per day -walk regularly, especially after meals.   6) Recent pulling of 9 teeth -mouth pain is currently her most severe pain -advised calling her dentist on Monday to let them know as she may require stronger pain medication from them temporarily. -burning electrical sensation suggestive of trigeminal neuralgia, advised hopefully temporary given recent surgery and that gabapentin and amitriptyline above should also help with this   8) UTI:  -keflex 500mg  BID ordered for 10 days

## 2022-12-30 NOTE — Addendum Note (Signed)
Addended by: Horton Chin on: 12/30/2022 02:52 PM   Modules accepted: Level of Service

## 2022-12-31 DIAGNOSIS — K5790 Diverticulosis of intestine, part unspecified, without perforation or abscess without bleeding: Secondary | ICD-10-CM | POA: Diagnosis not present

## 2022-12-31 DIAGNOSIS — R350 Frequency of micturition: Secondary | ICD-10-CM | POA: Diagnosis not present

## 2022-12-31 DIAGNOSIS — E039 Hypothyroidism, unspecified: Secondary | ICD-10-CM | POA: Diagnosis not present

## 2022-12-31 DIAGNOSIS — I10 Essential (primary) hypertension: Secondary | ICD-10-CM | POA: Diagnosis not present

## 2022-12-31 DIAGNOSIS — K219 Gastro-esophageal reflux disease without esophagitis: Secondary | ICD-10-CM | POA: Diagnosis not present

## 2022-12-31 DIAGNOSIS — Z87891 Personal history of nicotine dependence: Secondary | ICD-10-CM | POA: Diagnosis not present

## 2022-12-31 DIAGNOSIS — G4700 Insomnia, unspecified: Secondary | ICD-10-CM | POA: Diagnosis not present

## 2022-12-31 DIAGNOSIS — K589 Irritable bowel syndrome without diarrhea: Secondary | ICD-10-CM | POA: Diagnosis not present

## 2022-12-31 DIAGNOSIS — E785 Hyperlipidemia, unspecified: Secondary | ICD-10-CM | POA: Diagnosis not present

## 2022-12-31 DIAGNOSIS — E559 Vitamin D deficiency, unspecified: Secondary | ICD-10-CM | POA: Diagnosis not present

## 2022-12-31 DIAGNOSIS — G8929 Other chronic pain: Secondary | ICD-10-CM | POA: Diagnosis not present

## 2022-12-31 DIAGNOSIS — J449 Chronic obstructive pulmonary disease, unspecified: Secondary | ICD-10-CM | POA: Diagnosis not present

## 2022-12-31 DIAGNOSIS — D509 Iron deficiency anemia, unspecified: Secondary | ICD-10-CM | POA: Diagnosis not present

## 2022-12-31 DIAGNOSIS — K5090 Crohn's disease, unspecified, without complications: Secondary | ICD-10-CM | POA: Diagnosis not present

## 2022-12-31 DIAGNOSIS — E876 Hypokalemia: Secondary | ICD-10-CM | POA: Diagnosis not present

## 2022-12-31 DIAGNOSIS — M1990 Unspecified osteoarthritis, unspecified site: Secondary | ICD-10-CM | POA: Diagnosis not present

## 2022-12-31 DIAGNOSIS — E538 Deficiency of other specified B group vitamins: Secondary | ICD-10-CM | POA: Diagnosis not present

## 2022-12-31 DIAGNOSIS — Z79891 Long term (current) use of opiate analgesic: Secondary | ICD-10-CM | POA: Diagnosis not present

## 2022-12-31 DIAGNOSIS — Z7951 Long term (current) use of inhaled steroids: Secondary | ICD-10-CM | POA: Diagnosis not present

## 2022-12-31 DIAGNOSIS — I2510 Atherosclerotic heart disease of native coronary artery without angina pectoris: Secondary | ICD-10-CM | POA: Diagnosis not present

## 2022-12-31 DIAGNOSIS — F32A Depression, unspecified: Secondary | ICD-10-CM | POA: Diagnosis not present

## 2022-12-31 DIAGNOSIS — I700 Atherosclerosis of aorta: Secondary | ICD-10-CM | POA: Diagnosis not present

## 2022-12-31 DIAGNOSIS — Z9181 History of falling: Secondary | ICD-10-CM | POA: Diagnosis not present

## 2022-12-31 DIAGNOSIS — Z85118 Personal history of other malignant neoplasm of bronchus and lung: Secondary | ICD-10-CM | POA: Diagnosis not present

## 2023-01-01 DIAGNOSIS — R350 Frequency of micturition: Secondary | ICD-10-CM | POA: Diagnosis not present

## 2023-01-01 DIAGNOSIS — F32A Depression, unspecified: Secondary | ICD-10-CM | POA: Diagnosis not present

## 2023-01-01 DIAGNOSIS — Z7951 Long term (current) use of inhaled steroids: Secondary | ICD-10-CM | POA: Diagnosis not present

## 2023-01-01 DIAGNOSIS — K219 Gastro-esophageal reflux disease without esophagitis: Secondary | ICD-10-CM | POA: Diagnosis not present

## 2023-01-01 DIAGNOSIS — E785 Hyperlipidemia, unspecified: Secondary | ICD-10-CM | POA: Diagnosis not present

## 2023-01-01 DIAGNOSIS — G8929 Other chronic pain: Secondary | ICD-10-CM | POA: Diagnosis not present

## 2023-01-01 DIAGNOSIS — I2510 Atherosclerotic heart disease of native coronary artery without angina pectoris: Secondary | ICD-10-CM | POA: Diagnosis not present

## 2023-01-01 DIAGNOSIS — I700 Atherosclerosis of aorta: Secondary | ICD-10-CM | POA: Diagnosis not present

## 2023-01-01 DIAGNOSIS — Z87891 Personal history of nicotine dependence: Secondary | ICD-10-CM | POA: Diagnosis not present

## 2023-01-01 DIAGNOSIS — E039 Hypothyroidism, unspecified: Secondary | ICD-10-CM | POA: Diagnosis not present

## 2023-01-01 DIAGNOSIS — K589 Irritable bowel syndrome without diarrhea: Secondary | ICD-10-CM | POA: Diagnosis not present

## 2023-01-01 DIAGNOSIS — K5090 Crohn's disease, unspecified, without complications: Secondary | ICD-10-CM | POA: Diagnosis not present

## 2023-01-01 DIAGNOSIS — K5790 Diverticulosis of intestine, part unspecified, without perforation or abscess without bleeding: Secondary | ICD-10-CM | POA: Diagnosis not present

## 2023-01-01 DIAGNOSIS — E538 Deficiency of other specified B group vitamins: Secondary | ICD-10-CM | POA: Diagnosis not present

## 2023-01-01 DIAGNOSIS — Z79891 Long term (current) use of opiate analgesic: Secondary | ICD-10-CM | POA: Diagnosis not present

## 2023-01-01 DIAGNOSIS — Z85118 Personal history of other malignant neoplasm of bronchus and lung: Secondary | ICD-10-CM | POA: Diagnosis not present

## 2023-01-01 DIAGNOSIS — E876 Hypokalemia: Secondary | ICD-10-CM | POA: Diagnosis not present

## 2023-01-01 DIAGNOSIS — D509 Iron deficiency anemia, unspecified: Secondary | ICD-10-CM | POA: Diagnosis not present

## 2023-01-01 DIAGNOSIS — M1990 Unspecified osteoarthritis, unspecified site: Secondary | ICD-10-CM | POA: Diagnosis not present

## 2023-01-01 DIAGNOSIS — Z9181 History of falling: Secondary | ICD-10-CM | POA: Diagnosis not present

## 2023-01-01 DIAGNOSIS — E559 Vitamin D deficiency, unspecified: Secondary | ICD-10-CM | POA: Diagnosis not present

## 2023-01-01 DIAGNOSIS — I10 Essential (primary) hypertension: Secondary | ICD-10-CM | POA: Diagnosis not present

## 2023-01-01 DIAGNOSIS — G4700 Insomnia, unspecified: Secondary | ICD-10-CM | POA: Diagnosis not present

## 2023-01-01 DIAGNOSIS — J449 Chronic obstructive pulmonary disease, unspecified: Secondary | ICD-10-CM | POA: Diagnosis not present

## 2023-01-02 DIAGNOSIS — I2510 Atherosclerotic heart disease of native coronary artery without angina pectoris: Secondary | ICD-10-CM | POA: Diagnosis not present

## 2023-01-02 DIAGNOSIS — Z79891 Long term (current) use of opiate analgesic: Secondary | ICD-10-CM | POA: Diagnosis not present

## 2023-01-02 DIAGNOSIS — J449 Chronic obstructive pulmonary disease, unspecified: Secondary | ICD-10-CM | POA: Diagnosis not present

## 2023-01-02 DIAGNOSIS — E039 Hypothyroidism, unspecified: Secondary | ICD-10-CM | POA: Diagnosis not present

## 2023-01-02 DIAGNOSIS — I700 Atherosclerosis of aorta: Secondary | ICD-10-CM | POA: Diagnosis not present

## 2023-01-02 DIAGNOSIS — F32A Depression, unspecified: Secondary | ICD-10-CM | POA: Diagnosis not present

## 2023-01-02 DIAGNOSIS — G8929 Other chronic pain: Secondary | ICD-10-CM | POA: Diagnosis not present

## 2023-01-02 DIAGNOSIS — Z85118 Personal history of other malignant neoplasm of bronchus and lung: Secondary | ICD-10-CM | POA: Diagnosis not present

## 2023-01-02 DIAGNOSIS — I10 Essential (primary) hypertension: Secondary | ICD-10-CM | POA: Diagnosis not present

## 2023-01-02 DIAGNOSIS — M1990 Unspecified osteoarthritis, unspecified site: Secondary | ICD-10-CM | POA: Diagnosis not present

## 2023-01-02 DIAGNOSIS — Z87891 Personal history of nicotine dependence: Secondary | ICD-10-CM | POA: Diagnosis not present

## 2023-01-02 DIAGNOSIS — D509 Iron deficiency anemia, unspecified: Secondary | ICD-10-CM | POA: Diagnosis not present

## 2023-01-02 DIAGNOSIS — Z7951 Long term (current) use of inhaled steroids: Secondary | ICD-10-CM | POA: Diagnosis not present

## 2023-01-02 DIAGNOSIS — K5090 Crohn's disease, unspecified, without complications: Secondary | ICD-10-CM | POA: Diagnosis not present

## 2023-01-02 DIAGNOSIS — Z9181 History of falling: Secondary | ICD-10-CM | POA: Diagnosis not present

## 2023-01-02 DIAGNOSIS — E876 Hypokalemia: Secondary | ICD-10-CM | POA: Diagnosis not present

## 2023-01-02 DIAGNOSIS — K589 Irritable bowel syndrome without diarrhea: Secondary | ICD-10-CM | POA: Diagnosis not present

## 2023-01-02 DIAGNOSIS — R350 Frequency of micturition: Secondary | ICD-10-CM | POA: Diagnosis not present

## 2023-01-02 DIAGNOSIS — E538 Deficiency of other specified B group vitamins: Secondary | ICD-10-CM | POA: Diagnosis not present

## 2023-01-02 DIAGNOSIS — K219 Gastro-esophageal reflux disease without esophagitis: Secondary | ICD-10-CM | POA: Diagnosis not present

## 2023-01-02 DIAGNOSIS — K5790 Diverticulosis of intestine, part unspecified, without perforation or abscess without bleeding: Secondary | ICD-10-CM | POA: Diagnosis not present

## 2023-01-02 DIAGNOSIS — G4700 Insomnia, unspecified: Secondary | ICD-10-CM | POA: Diagnosis not present

## 2023-01-02 DIAGNOSIS — E559 Vitamin D deficiency, unspecified: Secondary | ICD-10-CM | POA: Diagnosis not present

## 2023-01-02 DIAGNOSIS — E785 Hyperlipidemia, unspecified: Secondary | ICD-10-CM | POA: Diagnosis not present

## 2023-01-02 NOTE — Telephone Encounter (Signed)
As some symptoms can go on without an actual UTI, we should check the urine testing  - I will order

## 2023-01-05 ENCOUNTER — Other Ambulatory Visit: Payer: Self-pay | Admitting: Physical Medicine and Rehabilitation

## 2023-01-06 ENCOUNTER — Emergency Department (HOSPITAL_COMMUNITY): Payer: Medicare Other

## 2023-01-06 ENCOUNTER — Telehealth: Payer: Self-pay | Admitting: Internal Medicine

## 2023-01-06 ENCOUNTER — Inpatient Hospital Stay (HOSPITAL_COMMUNITY)
Admission: EM | Admit: 2023-01-06 | Discharge: 2023-01-14 | DRG: 080 | Disposition: A | Discharge status: Skilled Nursing Facility | Payer: Medicare Other | Attending: Internal Medicine | Admitting: Internal Medicine

## 2023-01-06 ENCOUNTER — Other Ambulatory Visit: Payer: Self-pay

## 2023-01-06 DIAGNOSIS — J189 Pneumonia, unspecified organism: Secondary | ICD-10-CM | POA: Diagnosis present

## 2023-01-06 DIAGNOSIS — M545 Low back pain, unspecified: Secondary | ICD-10-CM

## 2023-01-06 DIAGNOSIS — N281 Cyst of kidney, acquired: Secondary | ICD-10-CM | POA: Diagnosis not present

## 2023-01-06 DIAGNOSIS — K589 Irritable bowel syndrome without diarrhea: Secondary | ICD-10-CM | POA: Diagnosis not present

## 2023-01-06 DIAGNOSIS — Z87891 Personal history of nicotine dependence: Secondary | ICD-10-CM | POA: Diagnosis not present

## 2023-01-06 DIAGNOSIS — M1990 Unspecified osteoarthritis, unspecified site: Secondary | ICD-10-CM | POA: Diagnosis not present

## 2023-01-06 DIAGNOSIS — W19XXXA Unspecified fall, initial encounter: Principal | ICD-10-CM | POA: Diagnosis present

## 2023-01-06 DIAGNOSIS — E039 Hypothyroidism, unspecified: Secondary | ICD-10-CM | POA: Diagnosis present

## 2023-01-06 DIAGNOSIS — Z79890 Hormone replacement therapy (postmenopausal): Secondary | ICD-10-CM

## 2023-01-06 DIAGNOSIS — K219 Gastro-esophageal reflux disease without esophagitis: Secondary | ICD-10-CM | POA: Diagnosis not present

## 2023-01-06 DIAGNOSIS — Z743 Need for continuous supervision: Secondary | ICD-10-CM | POA: Diagnosis not present

## 2023-01-06 DIAGNOSIS — J449 Chronic obstructive pulmonary disease, unspecified: Secondary | ICD-10-CM | POA: Diagnosis not present

## 2023-01-06 DIAGNOSIS — S3992XA Unspecified injury of lower back, initial encounter: Secondary | ICD-10-CM | POA: Diagnosis not present

## 2023-01-06 DIAGNOSIS — F419 Anxiety disorder, unspecified: Secondary | ICD-10-CM | POA: Diagnosis present

## 2023-01-06 DIAGNOSIS — E876 Hypokalemia: Secondary | ICD-10-CM | POA: Diagnosis present

## 2023-01-06 DIAGNOSIS — C3431 Malignant neoplasm of lower lobe, right bronchus or lung: Secondary | ICD-10-CM | POA: Diagnosis not present

## 2023-01-06 DIAGNOSIS — B0229 Other postherpetic nervous system involvement: Secondary | ICD-10-CM | POA: Diagnosis not present

## 2023-01-06 DIAGNOSIS — G936 Cerebral edema: Principal | ICD-10-CM | POA: Diagnosis present

## 2023-01-06 DIAGNOSIS — K5790 Diverticulosis of intestine, part unspecified, without perforation or abscess without bleeding: Secondary | ICD-10-CM | POA: Diagnosis not present

## 2023-01-06 DIAGNOSIS — E785 Hyperlipidemia, unspecified: Secondary | ICD-10-CM | POA: Diagnosis not present

## 2023-01-06 DIAGNOSIS — Z803 Family history of malignant neoplasm of breast: Secondary | ICD-10-CM

## 2023-01-06 DIAGNOSIS — Z66 Do not resuscitate: Secondary | ICD-10-CM | POA: Diagnosis not present

## 2023-01-06 DIAGNOSIS — S0990XA Unspecified injury of head, initial encounter: Secondary | ICD-10-CM | POA: Diagnosis not present

## 2023-01-06 DIAGNOSIS — G8929 Other chronic pain: Secondary | ICD-10-CM

## 2023-01-06 DIAGNOSIS — J432 Centrilobular emphysema: Secondary | ICD-10-CM | POA: Diagnosis not present

## 2023-01-06 DIAGNOSIS — C7931 Secondary malignant neoplasm of brain: Secondary | ICD-10-CM | POA: Diagnosis not present

## 2023-01-06 DIAGNOSIS — Z7401 Bed confinement status: Secondary | ICD-10-CM | POA: Diagnosis not present

## 2023-01-06 DIAGNOSIS — I700 Atherosclerosis of aorta: Secondary | ICD-10-CM | POA: Diagnosis not present

## 2023-01-06 DIAGNOSIS — J440 Chronic obstructive pulmonary disease with acute lower respiratory infection: Secondary | ICD-10-CM | POA: Diagnosis not present

## 2023-01-06 DIAGNOSIS — I10 Essential (primary) hypertension: Secondary | ICD-10-CM | POA: Diagnosis present

## 2023-01-06 DIAGNOSIS — Z85118 Personal history of other malignant neoplasm of bronchus and lung: Secondary | ICD-10-CM | POA: Diagnosis not present

## 2023-01-06 DIAGNOSIS — D509 Iron deficiency anemia, unspecified: Secondary | ICD-10-CM | POA: Diagnosis not present

## 2023-01-06 DIAGNOSIS — K5090 Crohn's disease, unspecified, without complications: Secondary | ICD-10-CM | POA: Diagnosis present

## 2023-01-06 DIAGNOSIS — F32A Depression, unspecified: Secondary | ICD-10-CM | POA: Diagnosis not present

## 2023-01-06 DIAGNOSIS — R55 Syncope and collapse: Secondary | ICD-10-CM | POA: Diagnosis not present

## 2023-01-06 DIAGNOSIS — I6523 Occlusion and stenosis of bilateral carotid arteries: Secondary | ICD-10-CM | POA: Diagnosis not present

## 2023-01-06 DIAGNOSIS — Z7951 Long term (current) use of inhaled steroids: Secondary | ICD-10-CM

## 2023-01-06 DIAGNOSIS — I672 Cerebral atherosclerosis: Secondary | ICD-10-CM | POA: Diagnosis not present

## 2023-01-06 DIAGNOSIS — G4700 Insomnia, unspecified: Secondary | ICD-10-CM | POA: Diagnosis present

## 2023-01-06 DIAGNOSIS — K828 Other specified diseases of gallbladder: Secondary | ICD-10-CM | POA: Diagnosis not present

## 2023-01-06 DIAGNOSIS — K5730 Diverticulosis of large intestine without perforation or abscess without bleeding: Secondary | ICD-10-CM | POA: Diagnosis not present

## 2023-01-06 DIAGNOSIS — Y92009 Unspecified place in unspecified non-institutional (private) residence as the place of occurrence of the external cause: Secondary | ICD-10-CM | POA: Diagnosis not present

## 2023-01-06 DIAGNOSIS — Z9181 History of falling: Secondary | ICD-10-CM | POA: Diagnosis not present

## 2023-01-06 DIAGNOSIS — C3490 Malignant neoplasm of unspecified part of unspecified bronchus or lung: Secondary | ICD-10-CM | POA: Diagnosis not present

## 2023-01-06 DIAGNOSIS — Z515 Encounter for palliative care: Secondary | ICD-10-CM

## 2023-01-06 DIAGNOSIS — M6281 Muscle weakness (generalized): Secondary | ICD-10-CM | POA: Diagnosis not present

## 2023-01-06 DIAGNOSIS — R410 Disorientation, unspecified: Secondary | ICD-10-CM | POA: Diagnosis not present

## 2023-01-06 DIAGNOSIS — Z79891 Long term (current) use of opiate analgesic: Secondary | ICD-10-CM | POA: Diagnosis not present

## 2023-01-06 DIAGNOSIS — S199XXA Unspecified injury of neck, initial encounter: Secondary | ICD-10-CM | POA: Diagnosis not present

## 2023-01-06 DIAGNOSIS — E538 Deficiency of other specified B group vitamins: Secondary | ICD-10-CM | POA: Diagnosis not present

## 2023-01-06 DIAGNOSIS — K429 Umbilical hernia without obstruction or gangrene: Secondary | ICD-10-CM | POA: Diagnosis not present

## 2023-01-06 DIAGNOSIS — Z87440 Personal history of urinary (tract) infections: Secondary | ICD-10-CM | POA: Diagnosis not present

## 2023-01-06 DIAGNOSIS — R220 Localized swelling, mass and lump, head: Secondary | ICD-10-CM | POA: Diagnosis not present

## 2023-01-06 DIAGNOSIS — M1612 Unilateral primary osteoarthritis, left hip: Secondary | ICD-10-CM

## 2023-01-06 DIAGNOSIS — Z79899 Other long term (current) drug therapy: Secondary | ICD-10-CM

## 2023-01-06 DIAGNOSIS — I2510 Atherosclerotic heart disease of native coronary artery without angina pectoris: Secondary | ICD-10-CM | POA: Diagnosis present

## 2023-01-06 DIAGNOSIS — G9389 Other specified disorders of brain: Secondary | ICD-10-CM | POA: Diagnosis not present

## 2023-01-06 DIAGNOSIS — R350 Frequency of micturition: Secondary | ICD-10-CM | POA: Diagnosis not present

## 2023-01-06 DIAGNOSIS — E440 Moderate protein-calorie malnutrition: Secondary | ICD-10-CM | POA: Diagnosis present

## 2023-01-06 DIAGNOSIS — J9811 Atelectasis: Secondary | ICD-10-CM | POA: Diagnosis not present

## 2023-01-06 DIAGNOSIS — E559 Vitamin D deficiency, unspecified: Secondary | ICD-10-CM | POA: Diagnosis not present

## 2023-01-06 LAB — COMPREHENSIVE METABOLIC PANEL
ALT: 16 U/L (ref 0–44)
AST: 39 U/L (ref 15–41)
Albumin: 3.5 g/dL (ref 3.5–5.0)
Alkaline Phosphatase: 64 U/L (ref 38–126)
Anion gap: 11 (ref 5–15)
BUN: 12 mg/dL (ref 8–23)
CO2: 25 mmol/L (ref 22–32)
Calcium: 9 mg/dL (ref 8.9–10.3)
Chloride: 100 mmol/L (ref 98–111)
Creatinine, Ser: 0.97 mg/dL (ref 0.44–1.00)
GFR, Estimated: 57 mL/min — ABNORMAL LOW (ref >60)
Glucose, Bld: 112 mg/dL — ABNORMAL HIGH (ref 70–99)
Potassium: 3.2 mmol/L — ABNORMAL LOW (ref 3.5–5.1)
Sodium: 136 mmol/L (ref 135–145)
Total Bilirubin: 1.5 mg/dL — ABNORMAL HIGH (ref 0.3–1.2)
Total Protein: 7.6 g/dL (ref 6.5–8.1)

## 2023-01-06 LAB — CBC WITH DIFFERENTIAL/PLATELET
Abs Immature Granulocytes: 0.05 10*3/uL (ref 0.00–0.07)
Basophils Absolute: 0 10*3/uL (ref 0.0–0.1)
Basophils Relative: 0 %
Eosinophils Absolute: 0 10*3/uL (ref 0.0–0.5)
Eosinophils Relative: 0 %
HCT: 37.3 % (ref 36.0–46.0)
Hemoglobin: 11.9 g/dL — ABNORMAL LOW (ref 12.0–15.0)
Immature Granulocytes: 0 %
Lymphocytes Relative: 10 %
Lymphs Abs: 1.1 10*3/uL (ref 0.7–4.0)
MCH: 29.9 pg (ref 26.0–34.0)
MCHC: 31.9 g/dL (ref 30.0–36.0)
MCV: 93.7 fL (ref 80.0–100.0)
Monocytes Absolute: 1 10*3/uL (ref 0.1–1.0)
Monocytes Relative: 9 %
Neutro Abs: 9.5 10*3/uL — ABNORMAL HIGH (ref 1.7–7.7)
Neutrophils Relative %: 81 %
Platelets: 242 10*3/uL (ref 150–400)
RBC: 3.98 MIL/uL (ref 3.87–5.11)
RDW: 13.3 % (ref 11.5–15.5)
WBC: 11.7 10*3/uL — ABNORMAL HIGH (ref 4.0–10.5)
nRBC: 0 % (ref 0.0–0.2)

## 2023-01-06 LAB — RAPID URINE DRUG SCREEN, HOSP PERFORMED
Amphetamines: NOT DETECTED
Barbiturates: NOT DETECTED
Benzodiazepines: POSITIVE — AB
Cocaine: NOT DETECTED
Opiates: NOT DETECTED
Tetrahydrocannabinol: NOT DETECTED

## 2023-01-06 LAB — ETHANOL: Alcohol, Ethyl (B): <10 mg/dL (ref <10)

## 2023-01-06 LAB — MAGNESIUM: Magnesium: 1.8 mg/dL (ref 1.7–2.4)

## 2023-01-06 LAB — LACTIC ACID, PLASMA: Lactic Acid, Venous: 1.3 mmol/L (ref 0.5–1.9)

## 2023-01-06 LAB — CBG MONITORING, ED: Glucose-Capillary: 115 mg/dL — ABNORMAL HIGH (ref 70–99)

## 2023-01-06 LAB — BLOOD GAS, VENOUS
Acid-Base Excess: 3 mmol/L — ABNORMAL HIGH (ref 0.0–2.0)
Bicarbonate: 28.5 mmol/L — ABNORMAL HIGH (ref 20.0–28.0)
O2 Saturation: 41 %
Patient temperature: 37
pCO2, Ven: 46 mmHg (ref 44–60)
pH, Ven: 7.4 (ref 7.25–7.43)
pO2, Ven: <31 mmHg — CL (ref 32–45)

## 2023-01-06 LAB — CK: Total CK: 493 U/L — ABNORMAL HIGH (ref 38–234)

## 2023-01-06 LAB — AMMONIA: Ammonia: <10 umol/L (ref 9–35)

## 2023-01-06 LAB — TROPONIN I (HIGH SENSITIVITY)
Troponin I (High Sensitivity): 15 ng/L (ref <18)
Troponin I (High Sensitivity): 15 ng/L (ref <18)

## 2023-01-06 MED ORDER — IOHEXOL 300 MG/ML  SOLN
100.0000 mL | Freq: Once | INTRAMUSCULAR | Status: AC | PRN
  Administered 2023-01-06: 100 mL via INTRAVENOUS

## 2023-01-06 MED ORDER — LEVETIRACETAM IN NACL 500 MG/100ML IV SOLN
500.0000 mg | Freq: Once | INTRAVENOUS | Status: AC
  Administered 2023-01-07: 500 mg via INTRAVENOUS
  Filled 2023-01-06: qty 100

## 2023-01-06 MED ORDER — ONDANSETRON HCL 4 MG/2ML IJ SOLN: 4.0000 mg | Freq: Four times a day (QID) | INTRAMUSCULAR | Status: DC | PRN

## 2023-01-06 MED ORDER — LEVETIRACETAM 500 MG PO TABS
500.0000 mg | ORAL_TABLET | Freq: Two times a day (BID) | ORAL | Status: DC
  Administered 2023-01-07 – 2023-01-14 (×15): 500 mg via ORAL
  Filled 2023-01-06 (×15): qty 1

## 2023-01-06 MED ORDER — LEVETIRACETAM IN NACL 500 MG/100ML IV SOLN: 500.0000 mg | Freq: Once | INTRAVENOUS | Status: DC

## 2023-01-06 MED ORDER — FLUTICASONE FUROATE-VILANTEROL 200-25 MCG/ACT IN AEPB
1.0000 | INHALATION_SPRAY | Freq: Every day | RESPIRATORY_TRACT | Status: DC
  Administered 2023-01-07 – 2023-01-14 (×8): 1 via RESPIRATORY_TRACT
  Filled 2023-01-06: qty 28

## 2023-01-06 MED ORDER — LOSARTAN POTASSIUM 25 MG PO TABS: 25.0000 mg | ORAL_TABLET | Freq: Every day | ORAL | Status: DC

## 2023-01-06 MED ORDER — SERTRALINE HCL 50 MG PO TABS
50.0000 mg | ORAL_TABLET | Freq: Every day | ORAL | Status: DC
  Administered 2023-01-07 – 2023-01-14 (×8): 50 mg via ORAL
  Filled 2023-01-06 (×8): qty 1

## 2023-01-06 MED ORDER — ENOXAPARIN SODIUM 40 MG/0.4ML IJ SOSY
40.0000 mg | PREFILLED_SYRINGE | INTRAMUSCULAR | Status: DC
  Administered 2023-01-07 – 2023-01-14 (×8): 40 mg via SUBCUTANEOUS
  Filled 2023-01-06 (×8): qty 0.4

## 2023-01-06 MED ORDER — LEVOTHYROXINE SODIUM 25 MCG PO TABS
25.0000 ug | ORAL_TABLET | Freq: Every day | ORAL | Status: DC
  Administered 2023-01-07 – 2023-01-14 (×8): 25 ug via ORAL
  Filled 2023-01-06 (×8): qty 1

## 2023-01-06 MED ORDER — ONDANSETRON HCL 4 MG PO TABS: 4.0000 mg | ORAL_TABLET | Freq: Four times a day (QID) | ORAL | Status: DC | PRN

## 2023-01-06 MED ORDER — FOLIC ACID 1 MG PO TABS
1.0000 mg | ORAL_TABLET | Freq: Every day | ORAL | Status: DC
  Administered 2023-01-07 – 2023-01-14 (×8): 1 mg via ORAL
  Filled 2023-01-06 (×8): qty 1

## 2023-01-06 MED ORDER — LORAZEPAM 0.5 MG PO TABS: 0.5000 mg | ORAL_TABLET | ORAL | Status: DC | PRN

## 2023-01-06 MED ORDER — PIPERACILLIN-TAZOBACTAM 3.375 G IVPB
3.3750 g | Freq: Three times a day (TID) | INTRAVENOUS | Status: DC
  Administered 2023-01-07 – 2023-01-11 (×13): 3.375 g via INTRAVENOUS
  Filled 2023-01-06 (×13): qty 50

## 2023-01-06 MED ORDER — POTASSIUM CHLORIDE IN NACL 20-0.9 MEQ/L-% IV SOLN
INTRAVENOUS | Status: DC
  Filled 2023-01-06: qty 1000

## 2023-01-06 MED ORDER — MORPHINE SULFATE (PF) 2 MG/ML IV SOLN
1.0000 mg | INTRAVENOUS | Status: DC | PRN
  Administered 2023-01-07 (×3): 1 mg via INTRAVENOUS
  Filled 2023-01-06 (×3): qty 1

## 2023-01-06 MED ORDER — DEXAMETHASONE SODIUM PHOSPHATE 10 MG/ML IJ SOLN
6.0000 mg | Freq: Two times a day (BID) | INTRAMUSCULAR | Status: DC
  Administered 2023-01-07 – 2023-01-08 (×4): 6 mg via INTRAVENOUS
  Filled 2023-01-06 (×4): qty 1

## 2023-01-06 MED ORDER — LORAZEPAM 1 MG PO TABS: 1.0000 mg | ORAL_TABLET | ORAL | Status: DC | PRN

## 2023-01-06 MED ORDER — PANTOPRAZOLE SODIUM 40 MG PO TBEC
40.0000 mg | DELAYED_RELEASE_TABLET | Freq: Every day | ORAL | Status: DC
  Administered 2023-01-07 – 2023-01-14 (×8): 40 mg via ORAL
  Filled 2023-01-06 (×8): qty 1

## 2023-01-06 MED ORDER — DEXAMETHASONE SODIUM PHOSPHATE 10 MG/ML IJ SOLN
10.0000 mg | Freq: Once | INTRAMUSCULAR | Status: AC
  Administered 2023-01-06: 10 mg via INTRAVENOUS
  Filled 2023-01-06: qty 1

## 2023-01-06 MED ORDER — POTASSIUM CHLORIDE CRYS ER 20 MEQ PO TBCR
40.0000 meq | EXTENDED_RELEASE_TABLET | Freq: Once | ORAL | Status: AC
  Administered 2023-01-06: 40 meq via ORAL
  Filled 2023-01-06: qty 2

## 2023-01-06 MED ORDER — DOCUSATE SODIUM 100 MG PO CAPS
100.0000 mg | ORAL_CAPSULE | Freq: Two times a day (BID) | ORAL | Status: DC
  Administered 2023-01-07 – 2023-01-13 (×11): 100 mg via ORAL
  Filled 2023-01-06 (×14): qty 1

## 2023-01-06 NOTE — ED Triage Notes (Signed)
Pt BIBA from home. Son called to have a welfare check, when police arrived, pt was on the floor. Pt was laying on bathroom floor, lethargic, confused. A&Ox2 Hx of dementia, FTT. Chronic upper back pain. Unsure if LOC.   BP 120/70 CBG 128 HR 74 97% RA

## 2023-01-06 NOTE — ED Notes (Signed)
During In and Out Cath, Pt was noted to have significant yeast and breakdown in perineal region.  Pt cleaned and barrier cream applied.

## 2023-01-06 NOTE — H&P (Signed)
PCP:   [name], MD   Chief Complaint:  Fall   HPI: This 88 year old female with history of lung cancer, on the palliative care, anxiety and depression, hypertension and postherpetic neuralgia.  She fell twice today and was taken to the ER.  Patient is alert and oriented but unable to provide history.  Patient confirms she fell twice. When asked if she hurts anywhere, she answers yes and adds, 'I can't get it staright in my mind'.  In the ER CT head shows brain mass, with vasogenic edema and a 18mm midline shift.  EDP contacted neurosurgery neurosurgeon on-call.  Okay to admit to [facility]:  Unable to obtain due to confusion.   Falls  Past Medical History: Past Medical History:  Diagnosis Date   Anemia    ANXIETY 07/23/2009   Arthritis    Atherosclerosis of aorta ([place]) 64/33/2951   Complication of anesthesia 7 or 8 yrs ago   woke up during colonscopy   COPD (chronic obstructive pulmonary disease) ([place])    Coronary atherosclerosis 07/24/2015   DEPRESSION 07/23/2009   DIVERTICULOSIS, COLON 07/23/2009   Dyspnea    GERD 07/23/2009   Headache(784.0) 07/23/2009   occasional   Hemorrhoids    HIP PAIN, LEFT 05/16/2010   History of Crohn's disease    HYPERLIPIDEMIA 07/23/2009   HYPERTENSION 07/23/2009   HYPOTHYROIDISM 07/23/2009   IBS (irritable bowel syndrome)    Insomnia    LOW BACK PAIN, CHRONIC 10/01/2009   lung ca dx'd 2019   SBRT comp 12/2018   PARESTHESIA 10/01/2009   Shingles 2006   back   Past Surgical History:  Procedure Laterality Date   ABDOMINAL HYSTERECTOMY  age 43 or 89   APPENDECTOMY  yrs ago   BILATERAL SALPINGOOPHORECTOMY  age 21 or 27   COLONOSCOPY N/A 11/17/2013   Procedure: COLONOSCOPY;  Surgeon: Cleotis Nipper, MD;  Location: [facility];  Service: Endoscopy;  Laterality: N/A;   COLONOSCOPY N/A 07/16/2021   ESOPHAGOGASTRODUODENOSCOPY N/A 11/17/2013   Procedure: ESOPHAGOGASTRODUODENOSCOPY (EGD);  Surgeon: Cleotis Nipper, MD;  Location: [facility];  Service: Endoscopy;  Laterality: N/A;   FINE NEEDLE ASPIRATION  10/15/2021   Procedure: FINE NEEDLE ASPIRATION (FNA) LINEAR;  Surgeon: Garner Nash, DO;  Location: [facility];  Service: Pulmonary;;   HEMORRHOID SURGERY  yrs ago   TONSILLECTOMY  yrs ago   [place] Right 10/15/2021   Procedure: VIDEO BRONCHOSCOPY WITH ENDOBRONCHIAL ULTRASOUND;  Surgeon: Garner Nash, DO;  Location: [facility];  Service: Pulmonary;  Laterality: Right;    Medications: Prior to Admission medications   Medication Sig Start Date End Date Taking? Authorizing Provider  Albuterol Sulfate (PROAIR RESPICLICK) 884 (90 Base) MCG/ACT AEPB Inhale 2 puffs into the lungs 4 (four) times daily as needed. 03/25/22   Horald Pollen, MD  BREO ELLIPTA 200-25 MCG/ACT AEPB Inhale 1 puff into the lungs daily. 11/24/22   [name], MD  cephALEXin (KEFLEX) 500 MG capsule Take 1 capsule (500 mg total) by mouth 2 (two) times daily. 12/30/22   Raulkar, Clide Deutscher, MD  clorazepate (TRANXENE) 7.5 MG tablet Take 1 tablet (7.5 mg total) by mouth 2 (two) times daily as needed for anxiety. 01/06/23   Raulkar, Clide Deutscher, MD  Dextromethorphan-guaiFENesin (MUCINEX DM) 30-600 MG TB12 Take 1 tablet by mouth 2 (two) times daily. 10/07/21   Parrett, Fonnie Mu, NP  docusate sodium (COLACE) 250 MG capsule Take 250 mg by mouth daily.  [provider]  doxepin (SINEQUAN) 10 MG capsule TAKE ONE CAPSULE BY MOUTH AT BEDTIME AS NEEDED 11/24/22   [name], MD  esomeprazole (NEXIUM) 40 MG capsule TAKE ONE CAPSULE BY MOUTH DAILY 11/24/22   [name], MD  folic acid (FOLVITE) 1 MG tablet TAKE ONE TABLET BY MOUTH DAILY 11/24/22   [name], MD  levothyroxine (SYNTHROID) 25 MCG tablet TAKE ONE TABLET BY MOUTH DAILY BEFORE BREAKFAST 11/24/22   [name], MD  losartan (COZAAR) 25 MG tablet TAKE ONE TABLET BY MOUTH DAILY HIGH BLOOD PRESSURE  11/24/22   [name], MD  mirabegron ER (MYRBETRIQ) 50 MG TB24 tablet Take 1 tablet (50 mg total) by mouth daily. 06/27/22   [name], MD  oxybutynin (DITROPAN-XL) 5 MG 24 hr tablet TAKE ONE TABLET BY MOUTH IN THE MORNING FOR BLADDER CONTROL 11/24/22   [name], MD  Oxycodone HCl 10 MG TABS TAKE ONE TABLET BY MOUTH EVERY 8 HOURS AS NEEDED Patient not taking: Reported on 12/30/2022 11/17/22   [name], MD  Plecanatide (TRULANCE) 3 MG TABS Take 1 tablet by mouth daily. 06/30/22   [name], MD  polyvinyl alcohol (LIQUIFILM TEARS) 1.4 % ophthalmic solution Place 1 drop into both eyes as needed for dry eyes.    [provider]  potassium chloride SA (KLOR-CON M) 20 MEQ tablet TAKE ONE TABLET BY MOUTH DAILY 11/24/22   [name], MD  pregabalin (LYRICA) 50 MG capsule Take 1 capsule (50 mg total) by mouth 3 (three) times daily. 12/30/22   Raulkar, Clide Deutscher, MD  promethazine (PHENERGAN) 25 MG tablet TAKE ONE TABLET BY MOUTH EVERY 8 HOURS AS NEEDED FOR NAUSEA AND VOMITING 11/07/22   [name], MD  RELISTOR 150 MG TABS TAKE THREE TABLETS BY MOUTH DAILY 10/01/22   [name], MD  sertraline (ZOLOFT) 50 MG tablet TAKE ONE TABLET BY MOUTH EVERY DAY 11/24/22   [name], MD  vitamin B-12 (CYANOCOBALAMIN) 50 MCG tablet Take 50 mcg by mouth daily.    [provider]  VITAMIN D-1000 MAX ST 25 MCG (1000 UT) tablet Take 1,000 Units by mouth daily. 02/24/20   [provider]    Allergies:   Allergies  Allergen Reactions   Tylenol [Acetaminophen] Swelling and Other (See Comments)    Nightmares, tongue swelling     Social History:  reports that she quit smoking about 24 years ago. Her smoking use included cigarettes. She started smoking about 62 years ago. She smoked an average of .5 packs per day. She has never used smokeless tobacco. She reports that she does not drink alcohol and does not use drugs.  Family History: Family History   Problem Relation Age of Onset   Cancer Mother        Unknown type   Cancer Sister        Breast   Clotting disorder Neg Hx     Physical Exam: Vitals:   01/06/23 1642 01/06/23 1758 01/06/23 1810 01/06/23 2023  BP: (!) 125/56  (!) 117/54 (!) 114/53  Pulse: 79  76 77  Resp: 16  19 17   Temp: 98.9 F (37.2 C) (!) 100.4 F (38 C)  99.9 F (37.7 C)  TempSrc: Oral Rectal  Oral  SpO2: 96%  95% 95%    General:  Alert and oriented times three, well developed and nourished, no acute distress, weak pleasant, chronically ill and weak appearing female  Eyes: PERRLA, pink conjunctiva, no scleral icterus, cataracts B/L ENT: Moist oral mucosa, neck supple, no thyromegaly Lungs: clear to ascultation, no wheeze, no crackles, no use of accessory muscles Cardiovascular: regular rate and rhythm, no regurgitation, no gallops, no murmurs. No carotid bruits, no JVD Abdomen: soft, positive BS, non-tender, non-distended, no organomegaly, not an acute abdomen GU: not examined Neuro: CN II - XII grossly intact, sensation intact Musculoskeletal: strength 5/5 all extremities, no clubbing, cyanosis or edema Skin: no rash, no subcutaneous crepitation, no decubitus Psych: appropriate patient   Labs on Admission:  Recent Labs    01/06/23 1738 01/06/23 1808  NA 136  --   K 3.2*  --   CL 100  --   CO2 25  --   GLUCOSE 112*  --   BUN 12  --   CREATININE 0.97  --   CALCIUM 9.0  --   MG  --  1.8   Recent Labs    01/06/23 1738  AST 39  ALT 16  ALKPHOS 64  BILITOT 1.5*  PROT 7.6  ALBUMIN 3.5    Recent Labs    01/06/23 1738  WBC 11.7*  NEUTROABS 9.5*  HGB 11.9*  HCT 37.3  MCV 93.7  PLT 242   Recent Labs    01/06/23 1738  CKTOTAL 493*     Radiological Exams on Admission: CT CHEST ABDOMEN PELVIS W CONTRAST  Result Date: 01/06/2023 CLINICAL DATA:  Patient found down.  Non-small-cell lung cancer EXAM: CT CHEST, ABDOMEN, AND PELVIS WITH CONTRAST TECHNIQUE: Multidetector CT imaging of  the chest, abdomen and pelvis was performed following the standard protocol during bolus administration of intravenous contrast. RADIATION DOSE REDUCTION: This exam was performed according to the departmental dose-optimization program which includes automated exposure control, adjustment of the mA and/or kV according to patient size and/or use of iterative reconstruction technique. CONTRAST:  196mL OMNIPAQUE IOHEXOL 300 MG/ML  SOLN COMPARISON:  CT chest 12/16/2021 and older. Abdomen and pelvis study 03/27/2021. FINDINGS: CT CHEST FINDINGS Cardiovascular: Heart is nonenlarged. No significant pericardial effusion. Scattered coronary artery calcifications are identified. There is also some calcifications towards the mitral valve annulus. The thoracic aorta has some partially calcified atherosclerotic plaque without dissection or aneurysm formation. Plaque extends along the origins of the great vessels. Slight ectasia of the distal descending thoracic aorta on axial image 38, nonspecific. There is some pulsation artifact along the ascending aorta but no mediastinal hematoma. Mediastinum/Nodes: Thyroid gland is small. No specific abnormal lymph node enlargement identified in the axillary regions, left hilum or mediastinum. There are some small nodes along the supraclavicular region on the left such as series 3, image 8 measuring 10 by 7 mm, unchanged. Nodular fullness along the right hilum is again seen with some small nodes. This includes series 3, image 35 measuring 17 by 12 mm. This is unchanged in retrospect from previous. The small node more superior at 6 mm in short axis is not well seen today and may relate to volume averaging. Lungs/Pleura: Centrilobular emphysematous lung changes. No pleural effusion or pneumothorax. Left lung has some basilar scarring and atelectatic areas. The masslike opacity extending from the right hilum into the right lower lobe is stable when taking into account technical differences.  There is some increasing atelectasis or opacity along the inferior middle lobe with some reticular and nodular changes. Recommend short follow-up. Musculoskeletal: Mild degenerative changes seen along the spine. Once again there is augmentation cement along the L2 compression vertebral body. CT ABDOMEN PELVIS  FINDINGS Hepatobiliary: No space-occupying liver lesion. Gallbladder is mildly distended. Patent portal vein. Pancreas: Mild pancreatic atrophy. Spleen: No splenic enlargement or mass.  Small lateral splenule Adrenals/Urinary Tract: Adrenal glands are preserved. Mild renal atrophy. Bosniak 1 left-sided renal cyst anteriorly in the midportion is stable. No specific imaging follow-up. No collecting system dilatation. The uterus has a normal course and caliber down to the bladder. Preserved contours of the urinary bladder. Stomach/Bowel: On this non oral contrast exam, the large bowel has a normal course and caliber with scattered colonic stool. Diffuse scattered colonic diverticula are identified. Appendix is not seen. No pericecal stranding or fluid. The stomach is relatively collapsed. Small bowel is nondilated. Vascular/Lymphatic: Normal caliber aorta and IVC with scattered atherosclerotic changes. Retroaortic left renal vein. Areas of stenosis along the iliac vessels to the atherosclerotic changes. Reproductive: Status post hysterectomy. No adnexal masses. Other: No ascites.  Small fat containing umbilical hernia. Musculoskeletal: Curvature and degenerative changes along the spine. Of note there is streak artifact as the the left arm was scanned across the abdomen. IMPRESSION: Relatively stable appearance to the right hilar lymph node enlargement and soft tissue mass extending into the right lower lobe consistent with known history of lung cancer. Increasing opacity along the inferior middle lobe. Recommend follow-up. No pneumothorax or effusion. No bowel obstruction, free air or free fluid. No evidence of  solid organ injury. Colonic diverticulosis. Stable compression of L2 with augmentation cement. Please see separate dictation of numerous other CT examinations from same day Electronically Signed   By: Jill Side M.D.   On: 01/06/2023 19:17   CT T-SPINE NO CHARGE  Result Date: 01/06/2023 CLINICAL DATA:  Found down EXAM: CT Thoracic and Lumbar spine without contrast TECHNIQUE: Multiplanar CT images of the thoracic and lumbar spine were reconstructed from contemporary CT of the Chest, Abdomen, and Pelvis. RADIATION DOSE REDUCTION: This exam was performed according to the departmental dose-optimization program which includes automated exposure control, adjustment of the mA and/or kV according to patient size and/or use of iterative reconstruction technique. CONTRAST:  No additional contrast COMPARISON:  None Available. FINDINGS: CT THORACIC SPINE FINDINGS Alignment: Normal. Vertebrae: No acute fracture or focal pathologic process. Disc levels: No spinal canal stenosis CT LUMBAR SPINE FINDINGS Segmentation: 5 lumbar type vertebrae. Alignment: Normal Vertebrae: Chronic L2 compression deformity with post augmentation changes. No acute fracture. No lytic or blastic osseous lesion. Disc levels: No spinal canal stenosis. IMPRESSION: 1. No acute fracture or static subluxation of the thoracic or lumbar spine. 2. Chronic L2 compression deformity with post augmentation changes. Electronically Signed   By: Ulyses Jarred M.D.   On: 01/06/2023 19:12   CT L-SPINE NO CHARGE  Result Date: 01/06/2023 CLINICAL DATA:  Found down EXAM: CT Thoracic and Lumbar spine without contrast TECHNIQUE: Multiplanar CT images of the thoracic and lumbar spine were reconstructed from contemporary CT of the Chest, Abdomen, and Pelvis. RADIATION DOSE REDUCTION: This exam was performed according to the departmental dose-optimization program which includes automated exposure control, adjustment of the mA and/or kV according to patient size and/or  use of iterative reconstruction technique. CONTRAST:  No additional contrast COMPARISON:  None Available. FINDINGS: CT THORACIC SPINE FINDINGS Alignment: Normal. Vertebrae: No acute fracture or focal pathologic process. Disc levels: No spinal canal stenosis CT LUMBAR SPINE FINDINGS Segmentation: 5 lumbar type vertebrae. Alignment: Normal Vertebrae: Chronic L2 compression deformity with post augmentation changes. No acute fracture. No lytic or blastic osseous lesion. Disc levels: No spinal canal stenosis. IMPRESSION: 1. No  acute fracture or static subluxation of the thoracic or lumbar spine. 2. Chronic L2 compression deformity with post augmentation changes. Electronically Signed   By: Ulyses Jarred M.D.   On: 01/06/2023 19:12   CT HEAD WO CONTRAST  Result Date: 01/06/2023 CLINICAL DATA:  Found down EXAM: CT HEAD WITHOUT CONTRAST CT CERVICAL SPINE WITHOUT CONTRAST TECHNIQUE: Multidetector CT imaging of the head and cervical spine was performed following the standard protocol without intravenous contrast. Multiplanar CT image reconstructions of the cervical spine were also generated. RADIATION DOSE REDUCTION: This exam was performed according to the departmental dose-optimization program which includes automated exposure control, adjustment of the mA and/or kV according to patient size and/or use of iterative reconstruction technique. COMPARISON:  None Available. FINDINGS: CT HEAD FINDINGS Brain: There is an intraparenchymal mass in the left frontal lobe that measures 3.0 x 3.3 cm. There is moderate surrounding vasogenic edema with rightward midline shift of approximately 2 mm. No hydrocephalus. No acute hemorrhage or extra-axial collection. Vascular: Atherosclerotic calcification of the internal carotid arteries at the skull base. No abnormal hyperdensity of the major intracranial arteries or dural venous sinuses. Skull: The visualized skull base, calvarium and extracranial soft tissues are normal.  Sinuses/Orbits: No fluid levels or advanced mucosal thickening of the visualized paranasal sinuses. No mastoid or middle ear effusion. The orbits are normal. CT CERVICAL SPINE FINDINGS Alignment: No static subluxation. Facets are aligned. Occipital condyles are normally positioned. Skull base and vertebrae: No acute fracture. Soft tissues and spinal canal: No prevertebral fluid or swelling. No visible canal hematoma. Disc levels: Multilevel degenerative disc disease without spinal canal stenosis. Upper chest: No pneumothorax, pulmonary nodule or pleural effusion. Other: Normal visualized paraspinal cervical soft tissues. IMPRESSION: 1. Intraparenchymal mass in the left frontal lobe with moderate surrounding vasogenic edema and 2 mm rightward midline shift. MRI with and without contrast is recommended for further characterization. 2. No acute fracture or static subluxation of the cervical spine. Electronically Signed   By: Ulyses Jarred M.D.   On: 01/06/2023 19:08   CT CERVICAL SPINE WO CONTRAST  Result Date: 01/06/2023 CLINICAL DATA:  Found down EXAM: CT HEAD WITHOUT CONTRAST CT CERVICAL SPINE WITHOUT CONTRAST TECHNIQUE: Multidetector CT imaging of the head and cervical spine was performed following the standard protocol without intravenous contrast. Multiplanar CT image reconstructions of the cervical spine were also generated. RADIATION DOSE REDUCTION: This exam was performed according to the departmental dose-optimization program which includes automated exposure control, adjustment of the mA and/or kV according to patient size and/or use of iterative reconstruction technique. COMPARISON:  None Available. FINDINGS: CT HEAD FINDINGS Brain: There is an intraparenchymal mass in the left frontal lobe that measures 3.0 x 3.3 cm. There is moderate surrounding vasogenic edema with rightward midline shift of approximately 2 mm. No hydrocephalus. No acute hemorrhage or extra-axial collection. Vascular: Atherosclerotic  calcification of the internal carotid arteries at the skull base. No abnormal hyperdensity of the major intracranial arteries or dural venous sinuses. Skull: The visualized skull base, calvarium and extracranial soft tissues are normal. Sinuses/Orbits: No fluid levels or advanced mucosal thickening of the visualized paranasal sinuses. No mastoid or middle ear effusion. The orbits are normal. CT CERVICAL SPINE FINDINGS Alignment: No static subluxation. Facets are aligned. Occipital condyles are normally positioned. Skull base and vertebrae: No acute fracture. Soft tissues and spinal canal: No prevertebral fluid or swelling. No visible canal hematoma. Disc levels: Multilevel degenerative disc disease without spinal canal stenosis. Upper chest: No pneumothorax, pulmonary nodule or pleural  effusion. Other: Normal visualized paraspinal cervical soft tissues. IMPRESSION: 1. Intraparenchymal mass in the left frontal lobe with moderate surrounding vasogenic edema and 2 mm rightward midline shift. MRI with and without contrast is recommended for further characterization. 2. No acute fracture or static subluxation of the cervical spine. Electronically Signed   By: Ulyses Jarred M.D.   On: 01/06/2023 19:08   DG Chest Port 1 View  Result Date: 01/06/2023 CLINICAL DATA:  Altered mental status. EXAM: PORTABLE CHEST 1 VIEW COMPARISON:  November 26, 2022 FINDINGS: The study is limited secondary to patient rotation. The heart size and mediastinal contours are within normal limits. A small amount of curvilinear radiopaque material is again seen overlying the right hilum. Mild linear atelectasis is noted within the bilateral lung bases. There is no evidence of an acute infiltrate, pleural effusion or pneumothorax. No acute osseous abnormalities identified. IMPRESSION: Mild bibasilar linear atelectasis. Electronically Signed   By: Virgina Norfolk M.D.   On: 01/06/2023 17:31    Assessment/Plan Present on Admission:  Vasogenic  brain edema (HCC)/brain metastasis Lung cancer -IV decadron q 6hrs for vasogenic edema -Keppra initiated prophylactic from seizure  -Palliative care -NS contacted by EDP, pain see patient in AM for official consult. Based on age and co-morbidities, no surgical intervention planned, ?radiation oncology -Continue oxycodone as needed mild pain, low-dose morphine as needed severe pain   Pneumonia -Per CT increasing opacity along the inferior middle lobe, WBC 11.7, low grade temp 100.4 -blood cultures collected, IV Zosyn started -Nebulizers as needed.  Patient on room air   Hypokalemia -replete IV, CMP in AM -Patient euvolemic   COPD -Resume Breo, nebulizers as needed   Fall -secondary to above -PT consulted placed -Lyrica on hold   Anxiety and depression -Resume Zoloft and Synthroid   Essential hypertension -Borderline blood pressure.  IV fluids ordered -Holding Cozaar   PHN (postherpetic neuralgia) -On Lyrica.  Held overnight  Nader Boys 01/06/2023, 9:36 PM

## 2023-01-06 NOTE — Telephone Encounter (Signed)
Spoke with pt son and was able to inform him of Dr. Gwynn Burly recommendation's. He states he understands.

## 2023-01-06 NOTE — ED Provider Notes (Signed)
Heather [name] Provider Note   CSN: [id number] Arrival date & time: 01/06/23  1615     History  Chief Complaint  Patient presents with   Heather [name] is a 88 y.o. female.  HPI   88 year old female with medical history significant for COPD, anxiety, depression, HTN, HLD, hypothyroidism, IBS, Crohn's disease, non-small cell lung cancer diagnosed in December 2019 status post SBRT who presents to the emergency department with altered mental status and a fall.  This was provided by EMS.  The patient was reportedly found on the floor after a welfare check was performed on her.  The patient was confused, lethargic, AAO x 2 with police.  Unclear LOC.  The patient was transported by EMS to the emergency department, CBG was notably 128, vitally stable, she arrived GCS 14, ABC intact, alert and oriented x 2 on arrival.  Additional history was obtained from Heather [name] the pt's son [phone]): recently at the hospital for pain in her hip. Went to her PCP for an after hospital visit. She has a hx of UTIs. She lives by herself. Took the medication for three days and lost the bottle and restarted the medication. She has had a progressive decline over the past couple of weeks. She has been forgetful. Had falls at home today. Fell early this AM. EMS evaluated and left her at home. No answer and a welfare check found her on the floor. Not on AC.   Home Medications Prior to Admission medications   Medication Sig Start Date End Date Taking? Authorizing Provider  Albuterol Sulfate (PROAIR RESPICLICK) 037 (90 Base) MCG/ACT AEPB Inhale 2 puffs into the lungs 4 (four) times daily as needed. 03/25/22   Horald Pollen, MD  BREO ELLIPTA 200-25 MCG/ACT AEPB Inhale 1 puff into the lungs daily. 11/24/22   [name], MD  cephALEXin (KEFLEX) 500 MG capsule Take 1 capsule (500 mg total) by mouth 2 (two) times daily. 12/30/22   Raulkar, Clide Deutscher, MD   clorazepate (TRANXENE) 7.5 MG tablet Take 1 tablet (7.5 mg total) by mouth 2 (two) times daily as needed for anxiety. 01/06/23   Raulkar, Clide Deutscher, MD  Dextromethorphan-guaiFENesin (MUCINEX DM) 30-600 MG TB12 Take 1 tablet by mouth 2 (two) times daily. 10/07/21   Parrett, Fonnie Mu, NP  docusate sodium (COLACE) 250 MG capsule Take 250 mg by mouth daily.    [provider]  doxepin (SINEQUAN) 10 MG capsule TAKE ONE CAPSULE BY MOUTH AT BEDTIME AS NEEDED 11/24/22   [name], MD  esomeprazole (NEXIUM) 40 MG capsule TAKE ONE CAPSULE BY MOUTH DAILY 11/24/22   [name], MD  folic acid (FOLVITE) 1 MG tablet TAKE ONE TABLET BY MOUTH DAILY 11/24/22   [name], MD  levothyroxine (SYNTHROID) 25 MCG tablet TAKE ONE TABLET BY MOUTH DAILY BEFORE BREAKFAST 11/24/22   [name], MD  losartan (COZAAR) 25 MG tablet TAKE ONE TABLET BY MOUTH DAILY HIGH BLOOD PRESSURE 11/24/22   [name], MD  mirabegron ER (MYRBETRIQ) 50 MG TB24 tablet Take 1 tablet (50 mg total) by mouth daily. 06/27/22   [name], MD  oxybutynin (DITROPAN-XL) 5 MG 24 hr tablet TAKE ONE TABLET BY MOUTH IN THE MORNING FOR BLADDER CONTROL 11/24/22   [name], MD  Oxycodone HCl 10 MG TABS TAKE ONE TABLET BY MOUTH EVERY 8 HOURS AS NEEDED Patient not taking: Reported on 12/30/2022 11/17/22  [name], MD  Plecanatide (TRULANCE) 3 MG TABS Take 1 tablet by mouth daily. 06/30/22   [name], MD  polyvinyl alcohol (LIQUIFILM TEARS) 1.4 % ophthalmic solution Place 1 drop into both eyes as needed for dry eyes.    [provider]  potassium chloride SA (KLOR-CON M) 20 MEQ tablet TAKE ONE TABLET BY MOUTH DAILY 11/24/22   [name], MD  pregabalin (LYRICA) 50 MG capsule Take 1 capsule (50 mg total) by mouth 3 (three) times daily. 12/30/22   Raulkar, Clide Deutscher, MD  promethazine (PHENERGAN) 25 MG tablet TAKE ONE TABLET BY MOUTH EVERY 8 HOURS AS NEEDED FOR NAUSEA AND VOMITING 11/07/22    [name], MD  RELISTOR 150 MG TABS TAKE THREE TABLETS BY MOUTH DAILY 10/01/22   [name], MD  sertraline (ZOLOFT) 50 MG tablet TAKE ONE TABLET BY MOUTH EVERY DAY 11/24/22   [name], MD  vitamin B-12 (CYANOCOBALAMIN) 50 MCG tablet Take 50 mcg by mouth daily.    [provider]  VITAMIN D-1000 MAX ST 25 MCG (1000 UT) tablet Take 1,000 Units by mouth daily. 02/24/20   [provider]      Allergies    Tylenol [acetaminophen]    Review of Systems   Review of Systems  Unable to perform ROS: Mental status change    Physical Exam Updated Vital Signs BP (!) 114/53 (BP Location: Left Arm)   Pulse 77   Temp 99.9 F (37.7 C) (Oral)   Resp 17   SpO2 95%  Physical Exam Vitals and nursing note reviewed.  Constitutional:      Appearance: She is well-developed.     Comments: GCS 14, ABC intact, AAOx2  HENT:     Head: Normocephalic and atraumatic.  Eyes:     Conjunctiva/sclera: Conjunctivae normal.  Cardiovascular:     Rate and Rhythm: Normal rate and regular rhythm.  Pulmonary:     Effort: Pulmonary effort is normal. No respiratory distress.     Breath sounds: Normal breath sounds.  Abdominal:     Palpations: Abdomen is soft.     Tenderness: There is no abdominal tenderness.  Musculoskeletal:     Cervical back: Neck supple.  Skin:    General: Skin is warm and dry.     Capillary Refill: Capillary refill takes less than 2 seconds.  Neurological:     Mental Status: She is alert.     Comments: No cranial nerve deficit, moving all 4 extremities with 5 out of 5 strength  Psychiatric:        Mood and Affect: Mood normal.     ED Results / Procedures / Treatments   Labs (all labs ordered are listed, but only abnormal results are displayed) Labs Reviewed  COMPREHENSIVE METABOLIC PANEL - Abnormal; Notable for the following components:      Result Value   Potassium 3.2 (*)    Glucose, Bld 112 (*)    Total Bilirubin 1.5 (*)    GFR, Estimated 57  (*)    All other components within normal limits  CBC WITH DIFFERENTIAL/PLATELET - Abnormal; Notable for the following components:   WBC 11.7 (*)    Hemoglobin 11.9 (*)    Neutro Abs 9.5 (*)    All other components within normal limits  BLOOD GAS, VENOUS - Abnormal; Notable for the following components:   pO2, Ven <31 (*)    Bicarbonate 28.5 (*)    Acid-Base Excess 3.0 (*)  All other components within normal limits  RAPID URINE DRUG SCREEN, HOSP PERFORMED - Abnormal; Notable for the following components:   Benzodiazepines POSITIVE (*)    All other components within normal limits  CK - Abnormal; Notable for the following components:   Total CK 493 (*)    All other components within normal limits  CBG MONITORING, ED - Abnormal; Notable for the following components:   Glucose-Capillary 115 (*)    All other components within normal limits  CULTURE, BLOOD (ROUTINE X 2)  CULTURE, BLOOD (ROUTINE X 2)  AMMONIA  LACTIC ACID, PLASMA  ETHANOL  MAGNESIUM  URINALYSIS, ROUTINE W REFLEX MICROSCOPIC  CBG MONITORING, ED  I-STAT CHEM 8, ED  TROPONIN I (HIGH SENSITIVITY)  TROPONIN I (HIGH SENSITIVITY)    EKG EKG Interpretation  Date/Time:  Tuesday January 06 2023 18:02:57 EST Ventricular Rate:  74 PR Interval:  173 QRS Duration: 88 QT Interval:  398 QTC Calculation: 442 R Axis:   74 Text Interpretation: Sinus rhythm Borderline T abnormalities, lateral leads Confirmed by Regan Lemming ([id number]) on 01/06/2023 6:20:35 PM  Radiology CT CHEST ABDOMEN PELVIS W CONTRAST  Result Date: 01/06/2023 CLINICAL DATA:  Patient found down.  Non-small-cell lung cancer EXAM: CT CHEST, ABDOMEN, AND PELVIS WITH CONTRAST TECHNIQUE: Multidetector CT imaging of the chest, abdomen and pelvis was performed following the standard protocol during bolus administration of intravenous contrast. RADIATION DOSE REDUCTION: This exam was performed according to the departmental dose-optimization program which includes  automated exposure control, adjustment of the mA and/or kV according to patient size and/or use of iterative reconstruction technique. CONTRAST:  157mL OMNIPAQUE IOHEXOL 300 MG/ML  SOLN COMPARISON:  CT chest 12/16/2021 and older. Abdomen and pelvis study 03/27/2021. FINDINGS: CT CHEST FINDINGS Cardiovascular: Heart is nonenlarged. No significant pericardial effusion. Scattered coronary artery calcifications are identified. There is also some calcifications towards the mitral valve annulus. The thoracic aorta has some partially calcified atherosclerotic plaque without dissection or aneurysm formation. Plaque extends along the origins of the great vessels. Slight ectasia of the distal descending thoracic aorta on axial image 38, nonspecific. There is some pulsation artifact along the ascending aorta but no mediastinal hematoma. Mediastinum/Nodes: Thyroid gland is small. No specific abnormal lymph node enlargement identified in the axillary regions, left hilum or mediastinum. There are some small nodes along the supraclavicular region on the left such as series 3, image 8 measuring 10 by 7 mm, unchanged. Nodular fullness along the right hilum is again seen with some small nodes. This includes series 3, image 35 measuring 17 by 12 mm. This is unchanged in retrospect from previous. The small node more superior at 6 mm in short axis is not well seen today and may relate to volume averaging. Lungs/Pleura: Centrilobular emphysematous lung changes. No pleural effusion or pneumothorax. Left lung has some basilar scarring and atelectatic areas. The masslike opacity extending from the right hilum into the right lower lobe is stable when taking into account technical differences. There is some increasing atelectasis or opacity along the inferior middle lobe with some reticular and nodular changes. Recommend short follow-up. Musculoskeletal: Mild degenerative changes seen along the spine. Once again there is augmentation cement  along the L2 compression vertebral body. CT ABDOMEN PELVIS FINDINGS Hepatobiliary: No space-occupying liver lesion. Gallbladder is mildly distended. Patent portal vein. Pancreas: Mild pancreatic atrophy. Spleen: No splenic enlargement or mass.  Small lateral splenule Adrenals/Urinary Tract: Adrenal glands are preserved. Mild renal atrophy. Bosniak 1 left-sided renal cyst anteriorly in the midportion is stable.  No specific imaging follow-up. No collecting system dilatation. The uterus has a normal course and caliber down to the bladder. Preserved contours of the urinary bladder. Stomach/Bowel: On this non oral contrast exam, the large bowel has a normal course and caliber with scattered colonic stool. Diffuse scattered colonic diverticula are identified. Appendix is not seen. No pericecal stranding or fluid. The stomach is relatively collapsed. Small bowel is nondilated. Vascular/Lymphatic: Normal caliber aorta and IVC with scattered atherosclerotic changes. Retroaortic left renal vein. Areas of stenosis along the iliac vessels to the atherosclerotic changes. Reproductive: Status post hysterectomy. No adnexal masses. Other: No ascites.  Small fat containing umbilical hernia. Musculoskeletal: Curvature and degenerative changes along the spine. Of note there is streak artifact as the the left arm was scanned across the abdomen. IMPRESSION: Relatively stable appearance to the right hilar lymph node enlargement and soft tissue mass extending into the right lower lobe consistent with known history of lung cancer. Increasing opacity along the inferior middle lobe. Recommend follow-up. No pneumothorax or effusion. No bowel obstruction, free air or free fluid. No evidence of solid organ injury. Colonic diverticulosis. Stable compression of L2 with augmentation cement. Please see separate dictation of numerous other CT examinations from same day Electronically Signed   By: Jill Side M.D.   On: 01/06/2023 19:17   CT  T-SPINE NO CHARGE  Result Date: 01/06/2023 CLINICAL DATA:  Found down EXAM: CT Thoracic and Lumbar spine without contrast TECHNIQUE: Multiplanar CT images of the thoracic and lumbar spine were reconstructed from contemporary CT of the Chest, Abdomen, and Pelvis. RADIATION DOSE REDUCTION: This exam was performed according to the departmental dose-optimization program which includes automated exposure control, adjustment of the mA and/or kV according to patient size and/or use of iterative reconstruction technique. CONTRAST:  No additional contrast COMPARISON:  None Available. FINDINGS: CT THORACIC SPINE FINDINGS Alignment: Normal. Vertebrae: No acute fracture or focal pathologic process. Disc levels: No spinal canal stenosis CT LUMBAR SPINE FINDINGS Segmentation: 5 lumbar type vertebrae. Alignment: Normal Vertebrae: Chronic L2 compression deformity with post augmentation changes. No acute fracture. No lytic or blastic osseous lesion. Disc levels: No spinal canal stenosis. IMPRESSION: 1. No acute fracture or static subluxation of the thoracic or lumbar spine. 2. Chronic L2 compression deformity with post augmentation changes. Electronically Signed   By: Ulyses Jarred M.D.   On: 01/06/2023 19:12   CT L-SPINE NO CHARGE  Result Date: 01/06/2023 CLINICAL DATA:  Found down EXAM: CT Thoracic and Lumbar spine without contrast TECHNIQUE: Multiplanar CT images of the thoracic and lumbar spine were reconstructed from contemporary CT of the Chest, Abdomen, and Pelvis. RADIATION DOSE REDUCTION: This exam was performed according to the departmental dose-optimization program which includes automated exposure control, adjustment of the mA and/or kV according to patient size and/or use of iterative reconstruction technique. CONTRAST:  No additional contrast COMPARISON:  None Available. FINDINGS: CT THORACIC SPINE FINDINGS Alignment: Normal. Vertebrae: No acute fracture or focal pathologic process. Disc levels: No spinal canal  stenosis CT LUMBAR SPINE FINDINGS Segmentation: 5 lumbar type vertebrae. Alignment: Normal Vertebrae: Chronic L2 compression deformity with post augmentation changes. No acute fracture. No lytic or blastic osseous lesion. Disc levels: No spinal canal stenosis. IMPRESSION: 1. No acute fracture or static subluxation of the thoracic or lumbar spine. 2. Chronic L2 compression deformity with post augmentation changes. Electronically Signed   By: Ulyses Jarred M.D.   On: 01/06/2023 19:12   CT HEAD WO CONTRAST  Result Date: 01/06/2023 CLINICAL DATA:  Found down EXAM: CT HEAD WITHOUT CONTRAST CT CERVICAL SPINE WITHOUT CONTRAST TECHNIQUE: Multidetector CT imaging of the head and cervical spine was performed following the standard protocol without intravenous contrast. Multiplanar CT image reconstructions of the cervical spine were also generated. RADIATION DOSE REDUCTION: This exam was performed according to the departmental dose-optimization program which includes automated exposure control, adjustment of the mA and/or kV according to patient size and/or use of iterative reconstruction technique. COMPARISON:  None Available. FINDINGS: CT HEAD FINDINGS Brain: There is an intraparenchymal mass in the left frontal lobe that measures 3.0 x 3.3 cm. There is moderate surrounding vasogenic edema with rightward midline shift of approximately 2 mm. No hydrocephalus. No acute hemorrhage or extra-axial collection. Vascular: Atherosclerotic calcification of the internal carotid arteries at the skull base. No abnormal hyperdensity of the major intracranial arteries or dural venous sinuses. Skull: The visualized skull base, calvarium and extracranial soft tissues are normal. Sinuses/Orbits: No fluid levels or advanced mucosal thickening of the visualized paranasal sinuses. No mastoid or middle ear effusion. The orbits are normal. CT CERVICAL SPINE FINDINGS Alignment: No static subluxation. Facets are aligned. Occipital condyles are  normally positioned. Skull base and vertebrae: No acute fracture. Soft tissues and spinal canal: No prevertebral fluid or swelling. No visible canal hematoma. Disc levels: Multilevel degenerative disc disease without spinal canal stenosis. Upper chest: No pneumothorax, pulmonary nodule or pleural effusion. Other: Normal visualized paraspinal cervical soft tissues. IMPRESSION: 1. Intraparenchymal mass in the left frontal lobe with moderate surrounding vasogenic edema and 2 mm rightward midline shift. MRI with and without contrast is recommended for further characterization. 2. No acute fracture or static subluxation of the cervical spine. Electronically Signed   By: Ulyses Jarred M.D.   On: 01/06/2023 19:08   CT CERVICAL SPINE WO CONTRAST  Result Date: 01/06/2023 CLINICAL DATA:  Found down EXAM: CT HEAD WITHOUT CONTRAST CT CERVICAL SPINE WITHOUT CONTRAST TECHNIQUE: Multidetector CT imaging of the head and cervical spine was performed following the standard protocol without intravenous contrast. Multiplanar CT image reconstructions of the cervical spine were also generated. RADIATION DOSE REDUCTION: This exam was performed according to the departmental dose-optimization program which includes automated exposure control, adjustment of the mA and/or kV according to patient size and/or use of iterative reconstruction technique. COMPARISON:  None Available. FINDINGS: CT HEAD FINDINGS Brain: There is an intraparenchymal mass in the left frontal lobe that measures 3.0 x 3.3 cm. There is moderate surrounding vasogenic edema with rightward midline shift of approximately 2 mm. No hydrocephalus. No acute hemorrhage or extra-axial collection. Vascular: Atherosclerotic calcification of the internal carotid arteries at the skull base. No abnormal hyperdensity of the major intracranial arteries or dural venous sinuses. Skull: The visualized skull base, calvarium and extracranial soft tissues are normal. Sinuses/Orbits: No fluid  levels or advanced mucosal thickening of the visualized paranasal sinuses. No mastoid or middle ear effusion. The orbits are normal. CT CERVICAL SPINE FINDINGS Alignment: No static subluxation. Facets are aligned. Occipital condyles are normally positioned. Skull base and vertebrae: No acute fracture. Soft tissues and spinal canal: No prevertebral fluid or swelling. No visible canal hematoma. Disc levels: Multilevel degenerative disc disease without spinal canal stenosis. Upper chest: No pneumothorax, pulmonary nodule or pleural effusion. Other: Normal visualized paraspinal cervical soft tissues. IMPRESSION: 1. Intraparenchymal mass in the left frontal lobe with moderate surrounding vasogenic edema and 2 mm rightward midline shift. MRI with and without contrast is recommended for further characterization. 2. No acute fracture or static subluxation of the  cervical spine. Electronically Signed   By: Ulyses Jarred M.D.   On: 01/06/2023 19:08   DG Chest Port 1 View  Result Date: 01/06/2023 CLINICAL DATA:  Altered mental status. EXAM: PORTABLE CHEST 1 VIEW COMPARISON:  November 26, 2022 FINDINGS: The study is limited secondary to patient rotation. The heart size and mediastinal contours are within normal limits. A small amount of curvilinear radiopaque material is again seen overlying the right hilum. Mild linear atelectasis is noted within the bilateral lung bases. There is no evidence of an acute infiltrate, pleural effusion or pneumothorax. No acute osseous abnormalities identified. IMPRESSION: Mild bibasilar linear atelectasis. Electronically Signed   By: Virgina Norfolk M.D.   On: 01/06/2023 17:31    Procedures Procedures    Medications Ordered in ED Medications  LORazepam (ATIVAN) tablet 0.5 mg (has no administration in time range)  LORazepam (ATIVAN) tablet 1 mg (has no administration in time range)  iohexol (OMNIPAQUE) 300 MG/ML solution 100 mL (100 mLs Intravenous Contrast Given 01/06/23 1831)   dexamethasone (DECADRON) injection 10 mg (10 mg Intravenous Given 01/06/23 1941)  potassium chloride SA (KLOR-CON M) CR tablet 40 mEq (40 mEq Oral Given 01/06/23 2022)    ED Course/ Medical Decision Making/ A&P                             Medical Decision Making Amount and/or Complexity of Data Reviewed Labs: ordered. Radiology: ordered.  Risk Prescription drug management. Decision regarding hospitalization.     88 year old female with medical history significant for COPD, anxiety, depression, HTN, HLD, hypothyroidism, IBS, Crohn's disease, non-small cell lung cancer diagnosed in December 2019 status post SBRT who presents to the emergency department with altered mental status and a fall.  This was provided by EMS.  The patient was reportedly found on the floor after a welfare check was performed on her.  The patient was confused, lethargic, AAO x 2 with police.  Unclear LOC.  The patient was transported by EMS to the emergency department, CBG was notably 128, vitally stable, she arrived GCS 14, ABC intact, alert and oriented x 2 on arrival.  Additional history was obtained from Heather [name] the pt's son [phone]): recently at the hospital for pain in her hip. Went to her PCP for an after hospital visit. She has a hx of UTIs. She lives by herself. Took the medication for three days and lost the bottle and restarted the medication. She has had a progressive decline over the past couple of weeks. She has been forgetful. Had falls at home today. Fell early this AM. EMS evaluated and left her at home. No answer and a welfare check found her on the floor. Not on AC.   Medical, the patient was afebrile, not tachycardic or tachypneic, BP 125/56, saturating 96% on room air.  Physical exam significant for patient mildly confused, GCS 14, AAO x 2, no clear focal neurologic deficit noted on exam.  Differential diagnosis of the patient's change in mental status is broad base includes infectious etiology,  electrolyte abnormality, other toxic metabolic derangement, CVA.  Additional concern for trauma therefore imaging workup was initiated.  CT head was performed which was concerning for an intracranial mass which is new.  The patient's son was updated regarding these findings.  She was administered 10 mg of IV Decadron.  Neurosurgery was consulted. IMPRESSION:  1. Intraparenchymal mass in the left frontal lobe with moderate  surrounding vasogenic edema and 2 mm  rightward midline shift. MRI  with and without contrast is recommended for further  characterization.  2. No acute fracture or static subluxation of the cervical spine.    CT C/A/P: IMPRESSION:  Relatively stable appearance to the right hilar lymph node  enlargement and soft tissue mass extending into the right lower lobe  consistent with known history of lung cancer.    Increasing opacity along the inferior middle lobe. Recommend  follow-up.    No pneumothorax or effusion.    No bowel obstruction, free air or free fluid. No evidence of solid  organ injury.    Colonic diverticulosis.    Stable compression of L2 with augmentation cement.    Please see separate dictation of numerous other CT examinations from  same day    Laboratory evaluation significant for CBG 115, troponin normal, ammonia normal, UDS positive for benzodiazepines, CK mildly elevated to 493, CMP without evidence of an AKI, no significant electrolyte abnormality, mild hypokalemia to 3.2, replenished orally.   CBC with a mild leukocytosis to 11.7, mild anemia to 11.9, slightly decreased from prior measurement of 12.5, magnesium normal at 1.8, left lower level normal, urinalysis collected and pending, blood cultures collected and pending.  Lactic acid normal at 1.3.  Spoke with Dr. Kathyrn Sheriff of neurosurgery who recommended MRI imaging of the brain with and without contrast, no immediate intervention.  The patient will be administered 10 mg of IV Decadron which  was ordered.  Dr. Kathyrn Sheriff requested to be reconsulted once MRI imaging was performed.  Hospitalist medicine was consulted for admission and Dr. Claria Dice accepted the patient in admission.  Final Clinical Impression(s) / ED Diagnoses Final diagnoses:  Fall, initial encounter  Brain mass    Rx / DC Orders ED Discharge Orders     None         Regan Lemming, MD 01/06/23 2140

## 2023-01-06 NOTE — Telephone Encounter (Signed)
Tamia with [facility] calls today with a fall note for PT. PT had a fall with no major injuries, was checked out by did not want to go to the hospital for further examination.  Tamia stated it might be good for PT to have a referral placed for an at home social worker. PT is at home alone the majority of the time and it is hard for PT to get around her place on her own.  CB for Tamia if needed: [phone]  FAX for referral: [phone]

## 2023-01-06 NOTE — Progress Notes (Signed)
Pharmacy Antibiotic Note  Heather [name] is a 88 y.o. female admitted on 01/06/2023 s/p fall.  Patient with h/o lung cancer.  CT shows brain mass.  Pharmacy has been consulted for Zosyn dosing for CAP.  Plan: Zosyn 3.375g IV q8h (4 hour infusion). Follow renal function F/U culture results and sensitivities  Height: 4\' 11"  (149.9 cm) Weight: 51.5 kg (113 lb 8 oz) IBW/kg (Calculated) : 43.2  Temp (24hrs), Avg:99.5 F (37.5 C), Min:98.6 F (37 C), Max:100.4 F (38 C)  Recent Labs  Lab 01/06/23 1736 01/06/23 1738  WBC  --  11.7*  CREATININE  --  0.97  LATICACIDVEN 1.3  --     Estimated Creatinine Clearance: 28.9 mL/min (by C-G formula based on SCr of 0.97 mg/dL).    Allergies  Allergen Reactions   Tylenol [Acetaminophen] Swelling and Other (See Comments)    Nightmares, tongue swelling     Antimicrobials this admission: 1/30 Zosyn >>      Dose adjustments this admission:    Microbiology results: 1/30 BCx:      Thank you for allowing pharmacy to be a part of this patient's care.  Everette Rank, PharmD 01/06/2023 11:51 PM

## 2023-01-07 ENCOUNTER — Other Ambulatory Visit: Payer: Self-pay

## 2023-01-07 ENCOUNTER — Encounter (HOSPITAL_COMMUNITY): Payer: Self-pay | Admitting: Family Medicine

## 2023-01-07 ENCOUNTER — Inpatient Hospital Stay (HOSPITAL_COMMUNITY): Payer: Medicare Other

## 2023-01-07 DIAGNOSIS — K5790 Diverticulosis of intestine, part unspecified, without perforation or abscess without bleeding: Secondary | ICD-10-CM

## 2023-01-07 DIAGNOSIS — G8929 Other chronic pain: Secondary | ICD-10-CM

## 2023-01-07 DIAGNOSIS — Z85118 Personal history of other malignant neoplasm of bronchus and lung: Secondary | ICD-10-CM

## 2023-01-07 DIAGNOSIS — Z79891 Long term (current) use of opiate analgesic: Secondary | ICD-10-CM

## 2023-01-07 DIAGNOSIS — R350 Frequency of micturition: Secondary | ICD-10-CM

## 2023-01-07 DIAGNOSIS — M1990 Unspecified osteoarthritis, unspecified site: Secondary | ICD-10-CM

## 2023-01-07 DIAGNOSIS — Z87891 Personal history of nicotine dependence: Secondary | ICD-10-CM

## 2023-01-07 DIAGNOSIS — E538 Deficiency of other specified B group vitamins: Secondary | ICD-10-CM

## 2023-01-07 DIAGNOSIS — E785 Hyperlipidemia, unspecified: Secondary | ICD-10-CM

## 2023-01-07 DIAGNOSIS — E039 Hypothyroidism, unspecified: Secondary | ICD-10-CM

## 2023-01-07 DIAGNOSIS — E876 Hypokalemia: Secondary | ICD-10-CM | POA: Diagnosis not present

## 2023-01-07 DIAGNOSIS — I2510 Atherosclerotic heart disease of native coronary artery without angina pectoris: Secondary | ICD-10-CM

## 2023-01-07 DIAGNOSIS — E559 Vitamin D deficiency, unspecified: Secondary | ICD-10-CM

## 2023-01-07 DIAGNOSIS — K5090 Crohn's disease, unspecified, without complications: Secondary | ICD-10-CM | POA: Diagnosis not present

## 2023-01-07 DIAGNOSIS — I10 Essential (primary) hypertension: Secondary | ICD-10-CM | POA: Diagnosis not present

## 2023-01-07 DIAGNOSIS — K589 Irritable bowel syndrome without diarrhea: Secondary | ICD-10-CM

## 2023-01-07 DIAGNOSIS — G4700 Insomnia, unspecified: Secondary | ICD-10-CM

## 2023-01-07 DIAGNOSIS — Z9181 History of falling: Secondary | ICD-10-CM

## 2023-01-07 DIAGNOSIS — W19XXXA Unspecified fall, initial encounter: Secondary | ICD-10-CM | POA: Diagnosis not present

## 2023-01-07 DIAGNOSIS — C3431 Malignant neoplasm of lower lobe, right bronchus or lung: Secondary | ICD-10-CM

## 2023-01-07 DIAGNOSIS — K219 Gastro-esophageal reflux disease without esophagitis: Secondary | ICD-10-CM

## 2023-01-07 DIAGNOSIS — I700 Atherosclerosis of aorta: Secondary | ICD-10-CM

## 2023-01-07 DIAGNOSIS — F32A Depression, unspecified: Secondary | ICD-10-CM

## 2023-01-07 DIAGNOSIS — F419 Anxiety disorder, unspecified: Secondary | ICD-10-CM

## 2023-01-07 DIAGNOSIS — Y92009 Unspecified place in unspecified non-institutional (private) residence as the place of occurrence of the external cause: Secondary | ICD-10-CM

## 2023-01-07 DIAGNOSIS — G9389 Other specified disorders of brain: Secondary | ICD-10-CM | POA: Diagnosis not present

## 2023-01-07 DIAGNOSIS — G936 Cerebral edema: Secondary | ICD-10-CM | POA: Diagnosis not present

## 2023-01-07 DIAGNOSIS — D509 Iron deficiency anemia, unspecified: Secondary | ICD-10-CM

## 2023-01-07 DIAGNOSIS — J449 Chronic obstructive pulmonary disease, unspecified: Secondary | ICD-10-CM | POA: Diagnosis not present

## 2023-01-07 DIAGNOSIS — Z7951 Long term (current) use of inhaled steroids: Secondary | ICD-10-CM

## 2023-01-07 LAB — URINALYSIS, ROUTINE W REFLEX MICROSCOPIC
Bacteria, UA: NONE SEEN
Bilirubin Urine: NEGATIVE
Glucose, UA: NEGATIVE mg/dL
Ketones, ur: NEGATIVE mg/dL
Leukocytes,Ua: NEGATIVE
Nitrite: NEGATIVE
Protein, ur: NEGATIVE mg/dL
Specific Gravity, Urine: 1.011 (ref 1.005–1.030)
pH: 7 (ref 5.0–8.0)

## 2023-01-07 LAB — CBC WITH DIFFERENTIAL/PLATELET
Abs Immature Granulocytes: 0.08 10*3/uL — ABNORMAL HIGH (ref 0.00–0.07)
Basophils Absolute: 0 10*3/uL (ref 0.0–0.1)
Basophils Relative: 0 %
Eosinophils Absolute: 0 10*3/uL (ref 0.0–0.5)
Eosinophils Relative: 0 %
HCT: 33.8 % — ABNORMAL LOW (ref 36.0–46.0)
Hemoglobin: 10.6 g/dL — ABNORMAL LOW (ref 12.0–15.0)
Immature Granulocytes: 1 %
Lymphocytes Relative: 9 %
Lymphs Abs: 0.7 10*3/uL (ref 0.7–4.0)
MCH: 29.8 pg (ref 26.0–34.0)
MCHC: 31.4 g/dL (ref 30.0–36.0)
MCV: 94.9 fL (ref 80.0–100.0)
Monocytes Absolute: 0.3 10*3/uL (ref 0.1–1.0)
Monocytes Relative: 3 %
Neutro Abs: 6.9 10*3/uL (ref 1.7–7.7)
Neutrophils Relative %: 87 %
Platelets: 237 10*3/uL (ref 150–400)
RBC: 3.56 MIL/uL — ABNORMAL LOW (ref 3.87–5.11)
RDW: 13.6 % (ref 11.5–15.5)
WBC: 8 10*3/uL (ref 4.0–10.5)
nRBC: 0 % (ref 0.0–0.2)

## 2023-01-07 LAB — COMPREHENSIVE METABOLIC PANEL
ALT: 13 U/L (ref 0–44)
AST: 24 U/L (ref 15–41)
Albumin: 3 g/dL — ABNORMAL LOW (ref 3.5–5.0)
Alkaline Phosphatase: 51 U/L (ref 38–126)
Anion gap: 11 (ref 5–15)
BUN: 11 mg/dL (ref 8–23)
CO2: 22 mmol/L (ref 22–32)
Calcium: 8.5 mg/dL — ABNORMAL LOW (ref 8.9–10.3)
Chloride: 102 mmol/L (ref 98–111)
Creatinine, Ser: 0.92 mg/dL (ref 0.44–1.00)
GFR, Estimated: >60 mL/min (ref >60)
Glucose, Bld: 166 mg/dL — ABNORMAL HIGH (ref 70–99)
Potassium: 3.7 mmol/L (ref 3.5–5.1)
Sodium: 135 mmol/L (ref 135–145)
Total Bilirubin: 1 mg/dL (ref 0.3–1.2)
Total Protein: 6.3 g/dL — ABNORMAL LOW (ref 6.5–8.1)

## 2023-01-07 LAB — GLUCOSE, CAPILLARY: Glucose-Capillary: 180 mg/dL — ABNORMAL HIGH (ref 70–99)

## 2023-01-07 LAB — MAGNESIUM: Magnesium: 2 mg/dL (ref 1.7–2.4)

## 2023-01-07 MED ORDER — GADOBUTROL 1 MMOL/ML IV SOLN
5.0000 mL | Freq: Once | INTRAVENOUS | Status: AC | PRN
  Administered 2023-01-07: 5 mL via INTRAVENOUS

## 2023-01-07 NOTE — TOC Initial Note (Addendum)
Transition of Care [facility]) - Initial/Assessment Note    Patient Details  Name: Heather [name] MRN: [id number] Date of Birth: 13-Jan-1935  Transition of Care [facility]) CM/SW Contact:    Dessa Phi, RN Phone Number: 01/07/2023, 10:47 AM  Clinical Narrative:  From home.Palliative care-await recc.                Expected Discharge Plan:  (TBD) Barriers to Discharge: Continued Medical Work up   Patient Goals and CMS Choice Patient states their goals for this hospitalization and ongoing recovery are::  (Home)          Expected Discharge Plan and Services                                              Prior Living Arrangements/Services                       Activities of Daily Living Home Assistive Devices/Equipment: Grab bars in shower, Environmental consultant (specify type), Eyeglasses, Dentures (specify type) ADL Screening (condition at time of admission) Patient's cognitive ability adequate to safely complete daily activities?: Yes Is the patient deaf or have difficulty hearing?: No Does the patient have difficulty seeing, even when wearing glasses/contacts?: No Does the patient have difficulty concentrating, remembering, or making decisions?: Yes Patient able to express need for assistance with ADLs?: Yes Does the patient have difficulty dressing or bathing?: Yes Independently performs ADLs?: Yes (appropriate for developmental age) Does the patient have difficulty walking or climbing stairs?: Yes Weakness of Legs: Both Weakness of Arms/Hands: Both  Permission Sought/Granted                  Emotional Assessment              Admission diagnosis:  Brain mass [G93.89] Fall, initial encounter [W19.XXXA] Lung cancer metastatic to brain ([place]) [C34.90, C79.31] Patient Active Problem List   Diagnosis Date Noted   Brain mass 01/06/2023   Vasogenic brain edema ([place]) 01/06/2023   Lung cancer metastatic to brain ([place]) 01/06/2023   Generalized weakness  12/04/2022   Urinary frequency 12/04/2022   Hypokalemia 12/04/2022   Right-sided chest pain 07/22/2022   Pneumonia 07/22/2022   Need for prophylactic vaccination and inoculation against varicella 07/01/2022   Encounter for general adult medical examination with abnormal findings 06/30/2022   Encounter for palliative care involving management of pain 06/30/2022   OAB (overactive bladder) 06/27/2022   Ventral hernia without obstruction or gangrene 03/25/2022   Chronic idiopathic constipation 07/01/2021   Insomnia secondary to chronic pain 06/24/2021   Primary osteoarthritis of left hip 06/24/2021   Therapeutic opioid-induced constipation (OIC) 06/24/2021   Iron deficiency anemia 04/10/2021   Goals of care, counseling/discussion 10/10/2020   PHN (postherpetic neuralgia) 05/09/2020   Fall at home, initial encounter 04/22/2019   Malignant neoplasm of lower lobe of right lung ([place]) 11/16/2018   Malnutrition of moderate degree 01/29/2018   Other emphysema ([place]) 06/26/2017   Coronary atherosclerosis 07/24/2015   Atherosclerosis of aorta ([place]) 07/24/2015   Dysphagia 07/24/2015   LOW BACK PAIN, CHRONIC 10/01/2009   Hypothyroidism 07/23/2009   Anxiety and depression 07/23/2009   Essential hypertension 07/23/2009   GERD 07/23/2009   PCP:  [name], MD Pharmacy:   [facility] [address] [place] - [address] [place] [address] Phone: [phone]  Fax: [phone]  [place], [place] [address] Phone: [phone] Fax: [phone]  [id number] - [place], [place] [address] Phone: [phone] Fax: [phone]     Social Determinants of Health (SDOH) Social History: [place]: No Food Insecurity (01/07/2023)  Housing: Low Risk  (01/07/2023)   Transportation Needs: No Transportation Needs (01/07/2023)  Utilities: Not At Risk (01/07/2023)  Alcohol Screen: Low Risk  (06/25/2022)  Depression (PHQ2-9): Low Risk  (12/30/2022)  Financial Resource Strain: Low Risk  (06/25/2022)  Physical Activity: Inactive (06/25/2022)  Social Connections: Socially Isolated (06/25/2022)  Stress: No Stress Concern Present (06/25/2022)  Tobacco Use: Medium Risk (01/07/2023)   SDOH Interventions:     Readmission Risk Interventions     No data to display

## 2023-01-07 NOTE — TOC Progression Note (Signed)
Transition of Care [facility]) - Progression Note    Patient Details  Name: Heather [name] MRN: [id number] Date of Birth: [birth year]  Transition of Care [facility]) CM/SW Contact  Sadler Teschner, Juliann Pulse, RN Phone Number: 01/07/2023, 1:22 PM  Clinical Narrative: Referral for residential hospice-patient/son [name] agree to residential hospice w/list-await choice.      Expected Discharge Plan: [place] Barriers to Discharge: Continued Medical Work up  Expected Discharge Plan and Services                                               Social Determinants of Health (SDOH) Interventions SDOH Screenings   Food Insecurity: No Food Insecurity (01/07/2023)  Housing: Low Risk  (01/07/2023)  Transportation Needs: No Transportation Needs (01/07/2023)  Utilities: Not At Risk (01/07/2023)  Alcohol Screen: Low Risk  (06/25/2022)  Depression (PHQ2-9): Low Risk  (12/30/2022)  Financial Resource Strain: Low Risk  (06/25/2022)  Physical Activity: Inactive (06/25/2022)  Social Connections: Socially Isolated (06/25/2022)  Stress: No Stress Concern Present (06/25/2022)  Tobacco Use: Medium Risk (01/07/2023)    Readmission Risk Interventions    01/07/2023   10:59 AM  Readmission Risk Prevention Plan  Transportation Screening Complete  PCP or Specialist Appt within 3-5 Days Complete  HRI or [place] Complete  Social Work Consult for [place] Planning/Counseling Complete  Palliative Care Screening Complete  Medication Review Press photographer) Complete

## 2023-01-07 NOTE — Progress Notes (Addendum)
PROGRESS NOTE    Heather [name]  IZT:[id number] DOB: Jul 19, 1935 DOA: 01/06/2023 PCP: [name], MD    Brief Narrative:  Patient is a 88 year old female with history of lung cancer, anxiety , depression, hypertension and postherpetic neuralgia presented hospital after falling twice with generalized body pain.  In the ED, patient had low-grade fever at 100.4 F..  Labs showed mild leukocytosis with elevated CK at 493.  Urine drug screen was positive for benzodiazepines.  Lactate was 1.3.  Alcohol level was less than 10.  CT head scan showed brain mass with vasogenic edema and 2 mm midline shift.  ED provider spoke with neurosurgery on-call and patient was considered for admission to the hospital for further evaluation and treatment.  Assessment/Plan  Vasogenic brain edema (HCC)/brain metastasis History of lung cancer.  Continue Decadron every 6 hrly for vasogenic edema.  Keppra was initiated prophylactically.  Was on palliative care outpatient will consult palliative care.  Neurosurgery was consulted by ED provider.  Likely no surgical intervention.  ?radiation oncology.  Continue supportive care including oxycodone, low-dose morphine.  Pneumonia CT scan of the chest showed increased opacity over the inferior and middle lobe.  WBC mildly elevated at 11.7 with temperature 100.4 F.  Blood cultures were drawn which were negative in 12 hours.  Continue Zosyn IV.  Hypokalemia Improved after replacement.  Latest potassium of 3.7.  Magnesium of 2.0.  COPD Continue Breo, nebulizers as needed   Fall x 2. Lyrica on hold.  PT has been consulted.  Anxiety and depression Continue Zoloft and Synthroid.   Essential hypertension Cozaar on hold.   PHN (postherpetic neuralgia) Lyrica on hold.    DVT prophylaxis: enoxaparin (LOVENOX) injection 40 mg Start: 01/07/23 1000 SCDs Start: 01/06/23 2219   Code Status:     Code Status: DNR  Disposition: Uncertain.  Spoke about goals of care.   Patient's son interested in palliative/hospice level of care at the facility level.  He does not think he would like her mom to go through aggressive treatment.  Status is: Inpatient  Remains inpatient appropriate because: IV steroids, brain metastasis   Family Communication: I spoke with the patient's son on the phone and updated him about the clinical condition of the patient.  Did speak about goals of care and patient's son is not interested in aggressive level of care due to her advanced age, and she has been through a lot..  Will consult TOC for hospice consideration.  Consultants:  Neurosurgery was notified from the ED  Procedures:  None  Antimicrobials:  Zosyn IV 01/07/23>  Anti-infectives (From admission, onward)    Start     Dose/Rate Route Frequency Ordered Stop   01/07/23 0100  piperacillin-tazobactam (ZOSYN) IVPB 3.375 g        3.375 g 12.5 mL/hr over 240 Minutes Intravenous Every 8 hours 01/06/23 2351        Subjective: Today, patient was seen and examined at bedside.  Patient complains of weakness and heaviness in her legs.  Denies any cough fever or chest pain.  Objective: Vitals:   01/06/23 2347 01/07/23 0328 01/07/23 0745 01/07/23 1056  BP:  101/67  (!) 104/53  Pulse:  (!) 59  70  Resp:  18  (!) 22  Temp:  98.4 F (36.9 C)  98.1 F (36.7 C)  TempSrc:  Oral  Oral  SpO2:  98% 98% 95%  Weight: 51.5 kg     Height: 4\' 11"  (1.499 m)  Intake/Output Summary (Last 24 hours) at 01/07/2023 1143 Last data filed at 01/07/2023 1030 Gross per 24 hour  Intake 733.81 ml  Output 300 ml  Net 433.81 ml   Filed Weights   01/06/23 2347  Weight: 51.5 kg    Physical Examination: Body mass index is 22.92 kg/m.   General:  Average built, not in obvious distress, appears chronically ill and deconditioned.  On nasal cannula oxygen. HENT:   No scleral pallor or icterus noted. Oral mucosa is moist.  Chest:    Diminished breath sounds bilaterally. No crackles or  wheezes.  CVS: S1 &S2 heard. No murmur.  Regular rate and rhythm. Abdomen: Soft, nontender, nondistended.  Bowel sounds are heard.   Extremities: No cyanosis, clubbing or edema.  Peripheral pulses are palpable. Psych: Alert, awake and Communicative, not in obvious distress, slow to respond, CNS:  No cranial nerve deficits.  Moves extremities. Skin: Warm and dry.  No rashes noted.  Data Reviewed:   CBC: Recent Labs  Lab 01/06/23 1738 01/07/23 0529  WBC 11.7* 8.0  NEUTROABS 9.5* 6.9  HGB 11.9* 10.6*  HCT 37.3 33.8*  MCV 93.7 94.9  PLT 242 539    Basic Metabolic Panel: Recent Labs  Lab 01/06/23 1738 01/06/23 1808 01/07/23 0529  NA 136  --  135  K 3.2*  --  3.7  CL 100  --  102  CO2 25  --  22  GLUCOSE 112*  --  166*  BUN 12  --  11  CREATININE 0.97  --  0.92  CALCIUM 9.0  --  8.5*  MG  --  1.8 2.0    Liver Function Tests: Recent Labs  Lab 01/06/23 1738 01/07/23 0529  AST 39 24  ALT 16 13  ALKPHOS 64 51  BILITOT 1.5* 1.0  PROT 7.6 6.3*  ALBUMIN 3.5 3.0*     Radiology Studies: CT CHEST ABDOMEN PELVIS W CONTRAST  Result Date: 01/06/2023 CLINICAL DATA:  Patient found down.  Non-small-cell lung cancer EXAM: CT CHEST, ABDOMEN, AND PELVIS WITH CONTRAST TECHNIQUE: Multidetector CT imaging of the chest, abdomen and pelvis was performed following the standard protocol during bolus administration of intravenous contrast. RADIATION DOSE REDUCTION: This exam was performed according to the departmental dose-optimization program which includes automated exposure control, adjustment of the mA and/or kV according to patient size and/or use of iterative reconstruction technique. CONTRAST:  128mL OMNIPAQUE IOHEXOL 300 MG/ML  SOLN COMPARISON:  CT chest 12/16/2021 and older. Abdomen and pelvis study 03/27/2021. FINDINGS: CT CHEST FINDINGS Cardiovascular: Heart is nonenlarged. No significant pericardial effusion. Scattered coronary artery calcifications are identified. There is also  some calcifications towards the mitral valve annulus. The thoracic aorta has some partially calcified atherosclerotic plaque without dissection or aneurysm formation. Plaque extends along the origins of the great vessels. Slight ectasia of the distal descending thoracic aorta on axial image 38, nonspecific. There is some pulsation artifact along the ascending aorta but no mediastinal hematoma. Mediastinum/Nodes: Thyroid gland is small. No specific abnormal lymph node enlargement identified in the axillary regions, left hilum or mediastinum. There are some small nodes along the supraclavicular region on the left such as series 3, image 8 measuring 10 by 7 mm, unchanged. Nodular fullness along the right hilum is again seen with some small nodes. This includes series 3, image 35 measuring 17 by 12 mm. This is unchanged in retrospect from previous. The small node more superior at 6 mm in short axis is not well seen today and may  relate to volume averaging. Lungs/Pleura: Centrilobular emphysematous lung changes. No pleural effusion or pneumothorax. Left lung has some basilar scarring and atelectatic areas. The masslike opacity extending from the right hilum into the right lower lobe is stable when taking into account technical differences. There is some increasing atelectasis or opacity along the inferior middle lobe with some reticular and nodular changes. Recommend short follow-up. Musculoskeletal: Mild degenerative changes seen along the spine. Once again there is augmentation cement along the L2 compression vertebral body. CT ABDOMEN PELVIS FINDINGS Hepatobiliary: No space-occupying liver lesion. Gallbladder is mildly distended. Patent portal vein. Pancreas: Mild pancreatic atrophy. Spleen: No splenic enlargement or mass.  Small lateral splenule Adrenals/Urinary Tract: Adrenal glands are preserved. Mild renal atrophy. Bosniak 1 left-sided renal cyst anteriorly in the midportion is stable. No specific imaging  follow-up. No collecting system dilatation. The uterus has a normal course and caliber down to the bladder. Preserved contours of the urinary bladder. Stomach/Bowel: On this non oral contrast exam, the large bowel has a normal course and caliber with scattered colonic stool. Diffuse scattered colonic diverticula are identified. Appendix is not seen. No pericecal stranding or fluid. The stomach is relatively collapsed. Small bowel is nondilated. Vascular/Lymphatic: Normal caliber aorta and IVC with scattered atherosclerotic changes. Retroaortic left renal vein. Areas of stenosis along the iliac vessels to the atherosclerotic changes. Reproductive: Status post hysterectomy. No adnexal masses. Other: No ascites.  Small fat containing umbilical hernia. Musculoskeletal: Curvature and degenerative changes along the spine. Of note there is streak artifact as the the left arm was scanned across the abdomen. IMPRESSION: Relatively stable appearance to the right hilar lymph node enlargement and soft tissue mass extending into the right lower lobe consistent with known history of lung cancer. Increasing opacity along the inferior middle lobe. Recommend follow-up. No pneumothorax or effusion. No bowel obstruction, free air or free fluid. No evidence of solid organ injury. Colonic diverticulosis. Stable compression of L2 with augmentation cement. Please see separate dictation of numerous other CT examinations from same day Electronically Signed   By: Jill Side M.D.   On: 01/06/2023 19:17   CT T-SPINE NO CHARGE  Result Date: 01/06/2023 CLINICAL DATA:  Found down EXAM: CT Thoracic and Lumbar spine without contrast TECHNIQUE: Multiplanar CT images of the thoracic and lumbar spine were reconstructed from contemporary CT of the Chest, Abdomen, and Pelvis. RADIATION DOSE REDUCTION: This exam was performed according to the departmental dose-optimization program which includes automated exposure control, adjustment of the mA  and/or kV according to patient size and/or use of iterative reconstruction technique. CONTRAST:  No additional contrast COMPARISON:  None Available. FINDINGS: CT THORACIC SPINE FINDINGS Alignment: Normal. Vertebrae: No acute fracture or focal pathologic process. Disc levels: No spinal canal stenosis CT LUMBAR SPINE FINDINGS Segmentation: 5 lumbar type vertebrae. Alignment: Normal Vertebrae: Chronic L2 compression deformity with post augmentation changes. No acute fracture. No lytic or blastic osseous lesion. Disc levels: No spinal canal stenosis. IMPRESSION: 1. No acute fracture or static subluxation of the thoracic or lumbar spine. 2. Chronic L2 compression deformity with post augmentation changes. Electronically Signed   By: Ulyses Jarred M.D.   On: 01/06/2023 19:12   CT L-SPINE NO CHARGE  Result Date: 01/06/2023 CLINICAL DATA:  Found down EXAM: CT Thoracic and Lumbar spine without contrast TECHNIQUE: Multiplanar CT images of the thoracic and lumbar spine were reconstructed from contemporary CT of the Chest, Abdomen, and Pelvis. RADIATION DOSE REDUCTION: This exam was performed according to the departmental dose-optimization program which  includes automated exposure control, adjustment of the mA and/or kV according to patient size and/or use of iterative reconstruction technique. CONTRAST:  No additional contrast COMPARISON:  None Available. FINDINGS: CT THORACIC SPINE FINDINGS Alignment: Normal. Vertebrae: No acute fracture or focal pathologic process. Disc levels: No spinal canal stenosis CT LUMBAR SPINE FINDINGS Segmentation: 5 lumbar type vertebrae. Alignment: Normal Vertebrae: Chronic L2 compression deformity with post augmentation changes. No acute fracture. No lytic or blastic osseous lesion. Disc levels: No spinal canal stenosis. IMPRESSION: 1. No acute fracture or static subluxation of the thoracic or lumbar spine. 2. Chronic L2 compression deformity with post augmentation changes. Electronically  Signed   By: Ulyses Jarred M.D.   On: 01/06/2023 19:12   CT HEAD WO CONTRAST  Result Date: 01/06/2023 CLINICAL DATA:  Found down EXAM: CT HEAD WITHOUT CONTRAST CT CERVICAL SPINE WITHOUT CONTRAST TECHNIQUE: Multidetector CT imaging of the head and cervical spine was performed following the standard protocol without intravenous contrast. Multiplanar CT image reconstructions of the cervical spine were also generated. RADIATION DOSE REDUCTION: This exam was performed according to the departmental dose-optimization program which includes automated exposure control, adjustment of the mA and/or kV according to patient size and/or use of iterative reconstruction technique. COMPARISON:  None Available. FINDINGS: CT HEAD FINDINGS Brain: There is an intraparenchymal mass in the left frontal lobe that measures 3.0 x 3.3 cm. There is moderate surrounding vasogenic edema with rightward midline shift of approximately 2 mm. No hydrocephalus. No acute hemorrhage or extra-axial collection. Vascular: Atherosclerotic calcification of the internal carotid arteries at the skull base. No abnormal hyperdensity of the major intracranial arteries or dural venous sinuses. Skull: The visualized skull base, calvarium and extracranial soft tissues are normal. Sinuses/Orbits: No fluid levels or advanced mucosal thickening of the visualized paranasal sinuses. No mastoid or middle ear effusion. The orbits are normal. CT CERVICAL SPINE FINDINGS Alignment: No static subluxation. Facets are aligned. Occipital condyles are normally positioned. Skull base and vertebrae: No acute fracture. Soft tissues and spinal canal: No prevertebral fluid or swelling. No visible canal hematoma. Disc levels: Multilevel degenerative disc disease without spinal canal stenosis. Upper chest: No pneumothorax, pulmonary nodule or pleural effusion. Other: Normal visualized paraspinal cervical soft tissues. IMPRESSION: 1. Intraparenchymal mass in the left frontal lobe with  moderate surrounding vasogenic edema and 2 mm rightward midline shift. MRI with and without contrast is recommended for further characterization. 2. No acute fracture or static subluxation of the cervical spine. Electronically Signed   By: Ulyses Jarred M.D.   On: 01/06/2023 19:08   CT CERVICAL SPINE WO CONTRAST  Result Date: 01/06/2023 CLINICAL DATA:  Found down EXAM: CT HEAD WITHOUT CONTRAST CT CERVICAL SPINE WITHOUT CONTRAST TECHNIQUE: Multidetector CT imaging of the head and cervical spine was performed following the standard protocol without intravenous contrast. Multiplanar CT image reconstructions of the cervical spine were also generated. RADIATION DOSE REDUCTION: This exam was performed according to the departmental dose-optimization program which includes automated exposure control, adjustment of the mA and/or kV according to patient size and/or use of iterative reconstruction technique. COMPARISON:  None Available. FINDINGS: CT HEAD FINDINGS Brain: There is an intraparenchymal mass in the left frontal lobe that measures 3.0 x 3.3 cm. There is moderate surrounding vasogenic edema with rightward midline shift of approximately 2 mm. No hydrocephalus. No acute hemorrhage or extra-axial collection. Vascular: Atherosclerotic calcification of the internal carotid arteries at the skull base. No abnormal hyperdensity of the major intracranial arteries or dural venous sinuses. Skull: The  visualized skull base, calvarium and extracranial soft tissues are normal. Sinuses/Orbits: No fluid levels or advanced mucosal thickening of the visualized paranasal sinuses. No mastoid or middle ear effusion. The orbits are normal. CT CERVICAL SPINE FINDINGS Alignment: No static subluxation. Facets are aligned. Occipital condyles are normally positioned. Skull base and vertebrae: No acute fracture. Soft tissues and spinal canal: No prevertebral fluid or swelling. No visible canal hematoma. Disc levels: Multilevel degenerative  disc disease without spinal canal stenosis. Upper chest: No pneumothorax, pulmonary nodule or pleural effusion. Other: Normal visualized paraspinal cervical soft tissues. IMPRESSION: 1. Intraparenchymal mass in the left frontal lobe with moderate surrounding vasogenic edema and 2 mm rightward midline shift. MRI with and without contrast is recommended for further characterization. 2. No acute fracture or static subluxation of the cervical spine. Electronically Signed   By: Ulyses Jarred M.D.   On: 01/06/2023 19:08   DG Chest Port 1 View  Result Date: 01/06/2023 CLINICAL DATA:  Altered mental status. EXAM: PORTABLE CHEST 1 VIEW COMPARISON:  November 26, 2022 FINDINGS: The study is limited secondary to patient rotation. The heart size and mediastinal contours are within normal limits. A small amount of curvilinear radiopaque material is again seen overlying the right hilum. Mild linear atelectasis is noted within the bilateral lung bases. There is no evidence of an acute infiltrate, pleural effusion or pneumothorax. No acute osseous abnormalities identified. IMPRESSION: Mild bibasilar linear atelectasis. Electronically Signed   By: Virgina Norfolk M.D.   On: 01/06/2023 17:31      LOS: 1 day     Flora Lipps, MD [facility] Available via Epic secure chat 7am-7pm After these hours, please refer to coverage provider listed on [email or web address] 01/07/2023, 11:43 AM

## 2023-01-07 NOTE — Progress Notes (Signed)
  PT Cancellation Note  Patient Details Name: Heather [name] MRN: [id number] DOB: June 22, 1935   Cancelled Treatment:    Reason Eval/Treat Not Completed: Note that patient is for residential Hospice. PT will sign off.  [place] Office [phone] Weekend pager-[phone]   Claretha Cooper 01/07/2023, 3:27 PM

## 2023-01-08 DIAGNOSIS — W19XXXA Unspecified fall, initial encounter: Secondary | ICD-10-CM | POA: Diagnosis not present

## 2023-01-08 DIAGNOSIS — G936 Cerebral edema: Secondary | ICD-10-CM | POA: Diagnosis not present

## 2023-01-08 DIAGNOSIS — F419 Anxiety disorder, unspecified: Secondary | ICD-10-CM | POA: Diagnosis not present

## 2023-01-08 DIAGNOSIS — I10 Essential (primary) hypertension: Secondary | ICD-10-CM | POA: Diagnosis not present

## 2023-01-08 LAB — GLUCOSE, CAPILLARY: Glucose-Capillary: 130 mg/dL — ABNORMAL HIGH (ref 70–99)

## 2023-01-08 MED ORDER — OXYCODONE HCL 5 MG PO TABS
5.0000 mg | ORAL_TABLET | ORAL | Status: DC | PRN
  Administered 2023-01-08 – 2023-01-14 (×12): 10 mg via ORAL
  Filled 2023-01-08 (×12): qty 2

## 2023-01-08 MED ORDER — MORPHINE SULFATE (PF) 2 MG/ML IV SOLN: 1.0000 mg | INTRAVENOUS | Status: DC | PRN

## 2023-01-08 MED ORDER — DEXAMETHASONE 4 MG PO TABS
4.0000 mg | ORAL_TABLET | Freq: Three times a day (TID) | ORAL | Status: DC
  Administered 2023-01-09 – 2023-01-14 (×16): 4 mg via ORAL
  Filled 2023-01-08 (×16): qty 1

## 2023-01-08 NOTE — TOC Progression Note (Addendum)
Transition of Care [facility]) - Progression Note    Patient Details  Name: Heather [name] MRN: [id number] Date of Birth: 1935/10/25  Transition of Care [facility] CM/SW Contact  Remonia Otte, Juliann Pulse, RN Phone Number: 01/08/2023, 10:05 AM  Clinical Narrative:  Steve(son) chose [facility] @ [facility] rep Judson Roch to eval-await outcome.  -10:20a-[facility] closed for renovations-Steve chose [facility] in [place] will eval-await outcome. -1:46p-[facility] appropriate for acceptance to residential hospice in [place]-they can accept for home w/hospice-Cheri will continue to follow for if becomes appropriate.Steve(son) updated-patient lives alone @ home-therefore home is unsafe for any services. -2:34p-spoke to son Marcos Eke to afford LTC private pay;does not have LTC policy-Steve agrees to [place] care rep Judson Roch evaluating for Residential hospice in [place]-await outcome.   Expected Discharge Plan: [place] Barriers to Discharge: Continued Medical Work up  Expected Discharge Plan and Services                                               Social Determinants of Health (SDOH) Interventions SDOH Screenings   Food Insecurity: No Food Insecurity (01/07/2023)  Housing: Low Risk  (01/07/2023)  Transportation Needs: No Transportation Needs (01/07/2023)  Utilities: Not At Risk (01/07/2023)  Alcohol Screen: Low Risk  (06/25/2022)  Depression (PHQ2-9): Low Risk  (12/30/2022)  Financial Resource Strain: Low Risk  (06/25/2022)  Physical Activity: Inactive (06/25/2022)  Social Connections: Socially Isolated (06/25/2022)  Stress: No Stress Concern Present (06/25/2022)  Tobacco Use: Medium Risk (01/07/2023)    Readmission Risk Interventions    01/07/2023   10:59 AM  Readmission Risk Prevention Plan  Transportation Screening Complete  PCP or Specialist Appt within 3-5 Days Complete  HRI or [place] Complete  Social  Work Consult for [place] Planning/Counseling Complete  Palliative Care Screening Complete  Medication Review Press photographer) Complete

## 2023-01-08 NOTE — Progress Notes (Signed)
[facility] and [place]   Referral received from [facility] for hospice care at the hospice home in [facility]. Met with pt at bedside while eating lunch. Which she was able to consume about 75% of and reports "that it taste so good".  I talked with son who was not at bedside.to discuss  hospice services. They are in agreement with comfort care and do not want to purse further workup for the cancer in her brain. I spoke to our MD at our facility and she is not felt to be meeting the criteria at this time. She may further decline so I will continue to follow for d/c plan. She would be appropriate for hospice care at home or in a facility if this is what the family chooses. If she declines and does become appropriate for our hospice facility I will be involved and can assist.   Update TOC Domenica Reamer RN [phone]

## 2023-01-08 NOTE — Progress Notes (Signed)
PROGRESS NOTE    Heather [name]  OAC:[id number] DOB: [birth year] DOA: 01/06/2023 PCP: [name], MD   Brief Narrative: Heather [name] is a 88 y.o. female with a history of lung cancer, anxiety, depression, anxiety, hypothyroidism. Patient presented secondary to falling and found to have evidence of likely pneumonia. Empiric antibiotics started. Brain imaging revealed a frontal lobe mass with associated vasogenic edema. Steroids started for treatment. Neurosurgery consulted with recommendation for no surgical management. Radiation oncology consulted for recommendations. Patient and family are interested in hospice, but at this time, patient is not a candidate for inpatient hospice.   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 Neurosurgery consulted with recommendation for no surgical management and consideration of radiation therapy. Patient started on decadron IV -Radiation therapy consult -Continue Decadron IV   Non-small cell lung cancer Presumed diagnosis. Patient has a history of SBRT but declined chemotherapy. Patient has not followed with oncology since 2022.   Community acquired pneumonia CT evidence of possible inferior middle lobe infiltrate, with associated mild leukocytosis and fever. Patient met sepsis criteria on admission. Patient started empirically on Zosyn. -Continue Zosyn   Hypokalemia Supplementation given. Resolved.   COPD Stable. -Continue Breo Ellipta   Fall Likely related to brain legion/edema. PT consulted, although patient/family interested in hospice services.   Anxiety Depression -Continue Zoloft  Hypothyroidism -Continue Synthroid   Primary hypertension Patient is on losartan as an outpatient, which  is held.   Postherpetic neuralgia Patient is on Lyrica as an outpatient, which is currently held.  DVT prophylaxis: SCDs Code Status:   Code Status: DNR Family Communication: Son at bedside Disposition Plan: Patient/family wanting hospice care. Unlikely candidate for inpatient hospice. Home with hospice cannot be accommodated. Patient will likely need LTC with hospice or SNF with palliative care. Awaiting radiation oncology recommendations prior to patient being medically ready for discharge. Anticipate medical readiness by 2/2   Consultants:  Neurosurgery Radiation oncology  Procedures:  None  Antimicrobials: Zosyn IV    Subjective: Patient without issues this morning. Still with some mild confusion.  Objective: BP (!) 119/44 (BP Location: Right Arm)   Pulse (!) 57   Temp 97.7 F (36.5 C) (Oral)   Resp 16   Ht 4\' 11"  (1.499 m)   Wt 51.5 kg   SpO2 98%   BMI 22.92 kg/m   Examination:  General exam: Appears calm and comfortable Respiratory system: Clear to auscultation. Respiratory effort normal. Cardiovascular system: S1 & S2 heard, RRR. Gastrointestinal system: Abdomen is nondistended, soft and nontender. Normal bowel sounds heard. Central nervous system: Alert. Musculoskeletal: No edema. No calf tenderness    Data Reviewed: I have personally reviewed following labs and imaging studies  CBC Lab Results  Component Value Date   WBC 8.0 01/07/2023   RBC 3.56 (L) 01/07/2023   HGB 10.6 (L) 01/07/2023   HCT 33.8 (L) 01/07/2023   MCV 94.9 01/07/2023   MCH 29.8 01/07/2023   PLT 237 01/07/2023   MCHC 31.4 01/07/2023   RDW 13.6 01/07/2023   LYMPHSABS 0.7 01/07/2023   MONOABS 0.3 01/07/2023   EOSABS 0.0 01/07/2023   BASOSABS 0.0 12/16/3233     Last metabolic panel Lab Results  Component Value Date   NA 135 01/07/2023   K 3.7 01/07/2023   CL 102 01/07/2023  CO2 22 01/07/2023   BUN 11 01/07/2023   CREATININE 0.92 01/07/2023   GLUCOSE 166 (H)  01/07/2023   GFRNONAA >60 01/07/2023   GFRAA >60 07/31/2020   CALCIUM 8.5 (L) 01/07/2023   PROT 6.3 (L) 01/07/2023   ALBUMIN 3.0 (L) 01/07/2023   LABGLOB 3.2 04/10/2021   AGRATIO 1.2 04/10/2021   BILITOT 1.0 01/07/2023   ALKPHOS 51 01/07/2023   AST 24 01/07/2023   ALT 13 01/07/2023   ANIONGAP 11 01/07/2023    GFR: Estimated Creatinine Clearance: 30.5 mL/min (by C-G formula based on SCr of 0.92 mg/dL).  Recent Results (from the past 240 hour(s))  Culture, blood (Routine X 2) w Reflex to ID Panel     Status: None (Preliminary result)   Collection Time: 01/06/23  5:29 PM   Specimen: BLOOD RIGHT HAND  Result Value Ref Range Status   Specimen Description   Final    BLOOD RIGHT HAND Performed at [facility], [place] [address] [place], [place] [address]    Special Requests   Final    BOTTLES DRAWN AEROBIC AND ANAEROBIC Blood Culture results may not be optimal due to an inadequate volume of blood received in culture bottles Performed at [facility] [address], [place], [place] [address]    Culture   Final    NO GROWTH 2 DAYS Performed at [facility] [address] [place], [place] [address]    Report Status PENDING  Incomplete  Culture, blood (Routine X 2) w Reflex to ID Panel     Status: None (Preliminary result)   Collection Time: 01/06/23  5:32 PM   Specimen: BLOOD  Result Value Ref Range Status   Specimen Description   Final    BLOOD RIGHT ANTECUBITAL Performed at [facility] [address], [place], [place] [address]    Special Requests   Final    BOTTLES DRAWN AEROBIC AND ANAEROBIC Blood Culture adequate volume Performed at [facility] [address] [place], [place] [address]    Culture   Final    NO GROWTH 2 DAYS Performed at [facility] [address] [place], [place] [address]    Report Status PENDING  Incomplete      Radiology Studies: MR Brain W and Wo  Contrast  Result Date: 01/07/2023 CLINICAL DATA:  Brain mass EXAM: MRI HEAD WITHOUT AND WITH CONTRAST TECHNIQUE: Multiplanar, multiecho pulse sequences of the brain and surrounding structures were obtained without and with intravenous contrast. CONTRAST:  21mL GADAVIST GADOBUTROL 1 MMOL/ML IV SOLN COMPARISON:  CT head 1 day prior FINDINGS: Brain: Again seen is a solid mass in the left frontal region measuring 3.0 cm AP x 2.8 cm TV by 3.4 cm cc. There is heterogeneous enhancement with central non enhancement. Foci of SWI signal dropout may reflect blood or mineralization. There is patchy diffusion restriction within the mass. The mass appears extra-axial particularly on the T2 sequences (12-23, 19-16). No dural tail is seen. There is significant edema in the underlying frontal lobe parenchyma extending to the external capsule. Mass effect results in partial effacement of the left lateral ventricle but no midline shift. There is no evidence of acute infarct. Background parenchymal volume is normal for age. The ventricles are otherwise normal in size. Patchy FLAIR signal abnormality in the remainder of the supratentorial white matter likely reflects underlying chronic small-vessel ischemic change. The pituitary and suprasellar region are normal. There is no other abnormal  enhancement. Vascular: Normal flow voids. Skull and upper cervical spine: Normal marrow signal. Sinuses/Orbits: The paranasal sinuses are clear. The globes and orbits are unremarkable. Other: None. IMPRESSION: Solid mass in the left frontal region appears extra-axial. The mass is indeterminate but could reflect metastatic disease given the history of lung cancer. Significant edema in the underlying frontal parenchyma with mild mass effect but no midline shift. Electronically Signed   By: Valetta Mole M.D.   On: 01/07/2023 15:00   CT CHEST ABDOMEN PELVIS W CONTRAST  Result Date: 01/06/2023 CLINICAL DATA:  Patient found down.  Non-small-cell lung  cancer EXAM: CT CHEST, ABDOMEN, AND PELVIS WITH CONTRAST TECHNIQUE: Multidetector CT imaging of the chest, abdomen and pelvis was performed following the standard protocol during bolus administration of intravenous contrast. RADIATION DOSE REDUCTION: This exam was performed according to the departmental dose-optimization program which includes automated exposure control, adjustment of the mA and/or kV according to patient size and/or use of iterative reconstruction technique. CONTRAST:  154mL OMNIPAQUE IOHEXOL 300 MG/ML  SOLN COMPARISON:  CT chest 12/16/2021 and older. Abdomen and pelvis study 03/27/2021. FINDINGS: CT CHEST FINDINGS Cardiovascular: Heart is nonenlarged. No significant pericardial effusion. Scattered coronary artery calcifications are identified. There is also some calcifications towards the mitral valve annulus. The thoracic aorta has some partially calcified atherosclerotic plaque without dissection or aneurysm formation. Plaque extends along the origins of the great vessels. Slight ectasia of the distal descending thoracic aorta on axial image 38, nonspecific. There is some pulsation artifact along the ascending aorta but no mediastinal hematoma. Mediastinum/Nodes: Thyroid gland is small. No specific abnormal lymph node enlargement identified in the axillary regions, left hilum or mediastinum. There are some small nodes along the supraclavicular region on the left such as series 3, image 8 measuring 10 by 7 mm, unchanged. Nodular fullness along the right hilum is again seen with some small nodes. This includes series 3, image 35 measuring 17 by 12 mm. This is unchanged in retrospect from previous. The small node more superior at 6 mm in short axis is not well seen today and may relate to volume averaging. Lungs/Pleura: Centrilobular emphysematous lung changes. No pleural effusion or pneumothorax. Left lung has some basilar scarring and atelectatic areas. The masslike opacity extending from the  right hilum into the right lower lobe is stable when taking into account technical differences. There is some increasing atelectasis or opacity along the inferior middle lobe with some reticular and nodular changes. Recommend short follow-up. Musculoskeletal: Mild degenerative changes seen along the spine. Once again there is augmentation cement along the L2 compression vertebral body. CT ABDOMEN PELVIS FINDINGS Hepatobiliary: No space-occupying liver lesion. Gallbladder is mildly distended. Patent portal vein. Pancreas: Mild pancreatic atrophy. Spleen: No splenic enlargement or mass.  Small lateral splenule Adrenals/Urinary Tract: Adrenal glands are preserved. Mild renal atrophy. Bosniak 1 left-sided renal cyst anteriorly in the midportion is stable. No specific imaging follow-up. No collecting system dilatation. The uterus has a normal course and caliber down to the bladder. Preserved contours of the urinary bladder. Stomach/Bowel: On this non oral contrast exam, the large bowel has a normal course and caliber with scattered colonic stool. Diffuse scattered colonic diverticula are identified. Appendix is not seen. No pericecal stranding or fluid. The stomach is relatively collapsed. Small bowel is nondilated. Vascular/Lymphatic: Normal caliber aorta and IVC with scattered atherosclerotic changes. Retroaortic left renal vein. Areas of stenosis along the iliac vessels to the atherosclerotic changes. Reproductive: Status post hysterectomy. No adnexal masses. Other: No ascites.  Small fat containing umbilical hernia. Musculoskeletal: Curvature and degenerative changes along the spine. Of note there is streak artifact as the the left arm was scanned across the abdomen. IMPRESSION: Relatively stable appearance to the right hilar lymph node enlargement and soft tissue mass extending into the right lower lobe consistent with known history of lung cancer. Increasing opacity along the inferior middle lobe. Recommend  follow-up. No pneumothorax or effusion. No bowel obstruction, free air or free fluid. No evidence of solid organ injury. Colonic diverticulosis. Stable compression of L2 with augmentation cement. Please see separate dictation of numerous other CT examinations from same day Electronically Signed   By: Jill Side M.D.   On: 01/06/2023 19:17   CT T-SPINE NO CHARGE  Result Date: 01/06/2023 CLINICAL DATA:  Found down EXAM: CT Thoracic and Lumbar spine without contrast TECHNIQUE: Multiplanar CT images of the thoracic and lumbar spine were reconstructed from contemporary CT of the Chest, Abdomen, and Pelvis. RADIATION DOSE REDUCTION: This exam was performed according to the departmental dose-optimization program which includes automated exposure control, adjustment of the mA and/or kV according to patient size and/or use of iterative reconstruction technique. CONTRAST:  No additional contrast COMPARISON:  None Available. FINDINGS: CT THORACIC SPINE FINDINGS Alignment: Normal. Vertebrae: No acute fracture or focal pathologic process. Disc levels: No spinal canal stenosis CT LUMBAR SPINE FINDINGS Segmentation: 5 lumbar type vertebrae. Alignment: Normal Vertebrae: Chronic L2 compression deformity with post augmentation changes. No acute fracture. No lytic or blastic osseous lesion. Disc levels: No spinal canal stenosis. IMPRESSION: 1. No acute fracture or static subluxation of the thoracic or lumbar spine. 2. Chronic L2 compression deformity with post augmentation changes. Electronically Signed   By: Ulyses Jarred M.D.   On: 01/06/2023 19:12   CT L-SPINE NO CHARGE  Result Date: 01/06/2023 CLINICAL DATA:  Found down EXAM: CT Thoracic and Lumbar spine without contrast TECHNIQUE: Multiplanar CT images of the thoracic and lumbar spine were reconstructed from contemporary CT of the Chest, Abdomen, and Pelvis. RADIATION DOSE REDUCTION: This exam was performed according to the departmental dose-optimization program which  includes automated exposure control, adjustment of the mA and/or kV according to patient size and/or use of iterative reconstruction technique. CONTRAST:  No additional contrast COMPARISON:  None Available. FINDINGS: CT THORACIC SPINE FINDINGS Alignment: Normal. Vertebrae: No acute fracture or focal pathologic process. Disc levels: No spinal canal stenosis CT LUMBAR SPINE FINDINGS Segmentation: 5 lumbar type vertebrae. Alignment: Normal Vertebrae: Chronic L2 compression deformity with post augmentation changes. No acute fracture. No lytic or blastic osseous lesion. Disc levels: No spinal canal stenosis. IMPRESSION: 1. No acute fracture or static subluxation of the thoracic or lumbar spine. 2. Chronic L2 compression deformity with post augmentation changes. Electronically Signed   By: Ulyses Jarred M.D.   On: 01/06/2023 19:12   CT HEAD WO CONTRAST  Result Date: 01/06/2023 CLINICAL DATA:  Found down EXAM: CT HEAD WITHOUT CONTRAST CT CERVICAL SPINE WITHOUT CONTRAST TECHNIQUE: Multidetector CT imaging of the head and cervical spine was performed following the standard protocol without intravenous contrast. Multiplanar CT image reconstructions of the cervical spine were also generated. RADIATION DOSE REDUCTION: This exam was performed according to the departmental dose-optimization program which includes automated exposure control, adjustment of the mA and/or kV according to patient size and/or use of iterative reconstruction technique. COMPARISON:  None Available. FINDINGS: CT HEAD FINDINGS Brain: There is an intraparenchymal mass in the left frontal lobe that measures 3.0 x 3.3 cm. There is moderate surrounding vasogenic  edema with rightward midline shift of approximately 2 mm. No hydrocephalus. No acute hemorrhage or extra-axial collection. Vascular: Atherosclerotic calcification of the internal carotid arteries at the skull base. No abnormal hyperdensity of the major intracranial arteries or dural venous sinuses.  Skull: The visualized skull base, calvarium and extracranial soft tissues are normal. Sinuses/Orbits: No fluid levels or advanced mucosal thickening of the visualized paranasal sinuses. No mastoid or middle ear effusion. The orbits are normal. CT CERVICAL SPINE FINDINGS Alignment: No static subluxation. Facets are aligned. Occipital condyles are normally positioned. Skull base and vertebrae: No acute fracture. Soft tissues and spinal canal: No prevertebral fluid or swelling. No visible canal hematoma. Disc levels: Multilevel degenerative disc disease without spinal canal stenosis. Upper chest: No pneumothorax, pulmonary nodule or pleural effusion. Other: Normal visualized paraspinal cervical soft tissues. IMPRESSION: 1. Intraparenchymal mass in the left frontal lobe with moderate surrounding vasogenic edema and 2 mm rightward midline shift. MRI with and without contrast is recommended for further characterization. 2. No acute fracture or static subluxation of the cervical spine. Electronically Signed   By: Ulyses Jarred M.D.   On: 01/06/2023 19:08   CT CERVICAL SPINE WO CONTRAST  Result Date: 01/06/2023 CLINICAL DATA:  Found down EXAM: CT HEAD WITHOUT CONTRAST CT CERVICAL SPINE WITHOUT CONTRAST TECHNIQUE: Multidetector CT imaging of the head and cervical spine was performed following the standard protocol without intravenous contrast. Multiplanar CT image reconstructions of the cervical spine were also generated. RADIATION DOSE REDUCTION: This exam was performed according to the departmental dose-optimization program which includes automated exposure control, adjustment of the mA and/or kV according to patient size and/or use of iterative reconstruction technique. COMPARISON:  None Available. FINDINGS: CT HEAD FINDINGS Brain: There is an intraparenchymal mass in the left frontal lobe that measures 3.0 x 3.3 cm. There is moderate surrounding vasogenic edema with rightward midline shift of approximately 2 mm. No  hydrocephalus. No acute hemorrhage or extra-axial collection. Vascular: Atherosclerotic calcification of the internal carotid arteries at the skull base. No abnormal hyperdensity of the major intracranial arteries or dural venous sinuses. Skull: The visualized skull base, calvarium and extracranial soft tissues are normal. Sinuses/Orbits: No fluid levels or advanced mucosal thickening of the visualized paranasal sinuses. No mastoid or middle ear effusion. The orbits are normal. CT CERVICAL SPINE FINDINGS Alignment: No static subluxation. Facets are aligned. Occipital condyles are normally positioned. Skull base and vertebrae: No acute fracture. Soft tissues and spinal canal: No prevertebral fluid or swelling. No visible canal hematoma. Disc levels: Multilevel degenerative disc disease without spinal canal stenosis. Upper chest: No pneumothorax, pulmonary nodule or pleural effusion. Other: Normal visualized paraspinal cervical soft tissues. IMPRESSION: 1. Intraparenchymal mass in the left frontal lobe with moderate surrounding vasogenic edema and 2 mm rightward midline shift. MRI with and without contrast is recommended for further characterization. 2. No acute fracture or static subluxation of the cervical spine. Electronically Signed   By: Ulyses Jarred M.D.   On: 01/06/2023 19:08   DG Chest Port 1 View  Result Date: 01/06/2023 CLINICAL DATA:  Altered mental status. EXAM: PORTABLE CHEST 1 VIEW COMPARISON:  November 26, 2022 FINDINGS: The study is limited secondary to patient rotation. The heart size and mediastinal contours are within normal limits. A small amount of curvilinear radiopaque material is again seen overlying the right hilum. Mild linear atelectasis is noted within the bilateral lung bases. There is no evidence of an acute infiltrate, pleural effusion or pneumothorax. No acute osseous abnormalities identified. IMPRESSION: Mild bibasilar  linear atelectasis. Electronically Signed   By: Virgina Norfolk M.D.   On: 01/06/2023 17:31      LOS: 2 days    Cordelia Poche, MD [facility] 01/08/2023, 7:49 AM   If 7PM-7AM, please contact night-coverage [email or web address]

## 2023-01-08 NOTE — Progress Notes (Signed)
Our service was notified about the patient being hospitalized. She has a history of putative early stage lung cancer treated with SBRT. She had progressive findings in the mediastinum and was followed by medical oncology since she was not a candidate for systemic therapy since she did not have tissue confirmation and was not well enough for bronchoscopy. Despite this she has done reasonably well since she was last seen, but presented with instability in gait, falls, and was found to have a large lesion in the frontal lobe of her brain. She had restaging CT scans and surprisingly she does not have bulky disease elsewhere. She is not a candidate for surgical resection but seems to have responded to steroids. There is question about her ability to proceed with therapy and enrolling in hospice care, however it sounds from speaking with her medical team and her son, that now she has been on steroids, she may be doing better than initially thought. That being said, her son feels like she is not capable of self care, and while he is open to considering treatment, his primary concern is that she be able to discharge to a facility for long term care. He is working with CM and TOC team to coordinate options, however she does not meet criteria for inpt hospice even if additional treatment is not pursued. [name], her son is concerned about the costs of placement in what he's heard so far.   We discussed the MRI findings, and the consideration for radiotherapy. Since she does not have significant disease in the chest, and the main issue is her frontal lesion, a course of stereotactic radiosurgery [facility]) could be considered in 3 fractions. [name] is aware of the need for 3T MRI, simulation, and meeting Dr. Kathyrn Sheriff as he would help in the planning of this style of therapy. [name] is open to considering this once she's an outpatient. He is also aware that if she discharges to a facility she could have  outpatient palliative care at that facility, but if she elects for hospice care, she would not be able to receive [facility] treatment. We reviewed her prognosis without treatment to her brain would likely be less than 3 months even including steroids, or possibly many more months if her brain disease is controlled. Even in this setting however, he is aware that without systemic therapy which she's previously not been a candidate for, she would likely have metastatic disease in similar or other parts of the body that could again become life threatening. We will continue to follow along and appreciate the team trying to determine next steps toward outpatient care. We would recommend dexamethasone 4 mg TID moving forward and if she elects for radiation, our department would coordinate her taper instructions at the appropriate time. I will follow up with [name] next week to see what decisions he and his mother would like to make regarding radiation.      Carola Rhine, PAC

## 2023-01-08 NOTE — Progress Notes (Incomplete)
PROGRESS NOTE    Heather [name]  YQM:[id number] DOB: 1935-08-06 DOA: 01/06/2023 PCP: [name], MD   Brief Narrative: Patient is a 88 year old female with history of lung cancer on the palliative care, anxiety , depression, hypertension and postherpetic neuralgia presented hospital after falling twice with generalized body pain.  In the ED, patient had low-grade fever at 100.4 F..  Labs showed mild leukocytosis with elevated CK at 493.  Urine drug screen was positive for benzodiazepines.  Lactate was 1.3.  Alcohol level was less than 10.  CT head scan showed brain mass with vasogenic edema and 2 mm midline shift.  ED provider spoke with neurosurgery on-call and patient was considered for admission to the hospital for further evaluation and treatment.  Assessment/Plan  Vasogenic brain edema (HCC)/brain metastasis History of lung cancer.  Continue Decadron every 6 for vasogenic edema.  Keppra was initiated prophylactically.  Was on palliative care outpatient will consult palliative care.  Neurosurgery was consulted by ED provider.  Likely no surgical intervention.  Question radiation oncology.  Continue supportive care including oxycodone low-dose morphine.  Pneumonia CT scan of the chest showed increased opacity over the inferior and middle lobe.  WBC mildly elevated at 11.7 with temperature 100.4 F.  Blood cultures were drawn which were negative in 12 hours.  Continue Zosyn.  Hypokalemia Improved after replacement.  Latest potassium of 3.7.  Magnesium of 2.0.  COPD Continue Breo, nebulizers as needed   Fall x 2. Lyrica on hold.  PT has been consulted.  Anxiety and depression Continue Zoloft and Synthroid.   Essential hypertension Cozaar on hold.   PHN (postherpetic neuralgia) Lyrica on hold.   Assessment and Plan:  Vasogenic brain edema Likely metastatic brain lesion ***  Non-small cell lung cancer ***  ***  pneumonia ***  Hypokalemia ***  COPD ***  Fall ***  Anxiety Depression ***  Primary hypertension ***  Postherpetic neuralgia ***  DVT prophylaxis: *** Code Status:   Code Status: DNR Family Communication: *** Disposition Plan: ***   Consultants:  ***  Procedures:  ***  Antimicrobials: ***    Subjective: ***  Objective: BP (!) 108/56 (BP Location: Right Arm)   Pulse 66   Temp 98.2 F (36.8 C) (Oral)   Resp 16   Ht 4\' 11"  (1.499 m)   Wt 51.5 kg   SpO2 98%   BMI 22.92 kg/m   Examination:  General exam: Appears calm and comfortable *** Respiratory system: Clear to auscultation. Respiratory effort normal. Cardiovascular system: S1 & S2 heard, RRR. No murmurs, rubs, gallops or clicks. Gastrointestinal system: Abdomen is nondistended, soft and nontender. No organomegaly or masses felt. Normal bowel sounds heard. Central nervous system: Alert and oriented. No focal neurological deficits. Musculoskeletal: No edema. No calf tenderness Skin: No cyanosis. No rashes Psychiatry: Judgement and insight appear normal. Mood & affect appropriate.    Data Reviewed: I have personally reviewed following labs and imaging studies  CBC Lab Results  Component Value Date   WBC 8.0 01/07/2023   RBC 3.56 (L) 01/07/2023   HGB 10.6 (L) 01/07/2023   HCT 33.8 (L) 01/07/2023   MCV 94.9 01/07/2023   MCH 29.8 01/07/2023   PLT 237 01/07/2023   MCHC 31.4 01/07/2023   RDW 13.6 01/07/2023   LYMPHSABS 0.7 01/07/2023   MONOABS 0.3 01/07/2023   EOSABS 0.0 01/07/2023   BASOSABS 0.0 52/84/1324     Last metabolic panel Lab Results  Component Value Date   NA 135 01/07/2023  K 3.7 01/07/2023   CL 102 01/07/2023   CO2 22 01/07/2023   BUN 11 01/07/2023   CREATININE 0.92 01/07/2023   GLUCOSE 166 (H) 01/07/2023   GFRNONAA >60 01/07/2023   GFRAA >60 07/31/2020   CALCIUM 8.5 (L) 01/07/2023   PROT 6.3 (L) 01/07/2023   ALBUMIN 3.0 (L) 01/07/2023   LABGLOB 3.2 04/10/2021    AGRATIO 1.2 04/10/2021   BILITOT 1.0 01/07/2023   ALKPHOS 51 01/07/2023   AST 24 01/07/2023   ALT 13 01/07/2023   ANIONGAP 11 01/07/2023    GFR: Estimated Creatinine Clearance: 30.5 mL/min (by C-G formula based on SCr of 0.92 mg/dL).  Recent Results (from the past 240 hour(s))  Culture, blood (Routine X 2) w Reflex to ID Panel     Status: None (Preliminary result)   Collection Time: 01/06/23  5:29 PM   Specimen: BLOOD RIGHT HAND  Result Value Ref Range Status   Specimen Description   Final    BLOOD RIGHT HAND Performed at [facility], [place] [address] [place], [place] [address]    Special Requests   Final    BOTTLES DRAWN AEROBIC AND ANAEROBIC Blood Culture results may not be optimal due to an inadequate volume of blood received in culture bottles Performed at [place] [address], [place], [place] [address]    Culture   Final    NO GROWTH 2 DAYS Performed at [facility] [address], [place], [place] [address]    Report Status PENDING  Incomplete  Culture, blood (Routine X 2) w Reflex to ID Panel     Status: None (Preliminary result)   Collection Time: 01/06/23  5:32 PM   Specimen: BLOOD  Result Value Ref Range Status   Specimen Description   Final    BLOOD RIGHT ANTECUBITAL Performed at [facility] [address], [place], [place] [address]    Special Requests   Final    BOTTLES DRAWN AEROBIC AND ANAEROBIC Blood Culture adequate volume Performed at [place] [facility] [address] [place], [place] [address]    Culture   Final    NO GROWTH 2 DAYS Performed at [facility] [address], [place], [place] [address]    Report Status PENDING  Incomplete      Radiology Studies: MR Brain W and Wo Contrast  Result Date: 01/07/2023 CLINICAL DATA:  Brain mass EXAM: MRI HEAD WITHOUT AND WITH CONTRAST TECHNIQUE: Multiplanar, multiecho pulse sequences of the brain and  surrounding structures were obtained without and with intravenous contrast. CONTRAST:  45mL GADAVIST GADOBUTROL 1 MMOL/ML IV SOLN COMPARISON:  CT head 1 day prior FINDINGS: Brain: Again seen is a solid mass in the left frontal region measuring 3.0 cm AP x 2.8 cm TV by 3.4 cm cc. There is heterogeneous enhancement with central non enhancement. Foci of SWI signal dropout may reflect blood or mineralization. There is patchy diffusion restriction within the mass. The mass appears extra-axial particularly on the T2 sequences (12-23, 19-16). No dural tail is seen. There is significant edema in the underlying frontal lobe parenchyma extending to the external capsule. Mass effect results in partial effacement of the left lateral ventricle but no midline shift. There is no evidence of acute infarct. Background parenchymal volume is normal for age. The ventricles are otherwise normal in size. Patchy FLAIR signal abnormality in the remainder of the supratentorial white matter likely reflects underlying chronic small-vessel ischemic change. The pituitary  and suprasellar region are normal. There is no other abnormal enhancement. Vascular: Normal flow voids. Skull and upper cervical spine: Normal marrow signal. Sinuses/Orbits: The paranasal sinuses are clear. The globes and orbits are unremarkable. Other: None. IMPRESSION: Solid mass in the left frontal region appears extra-axial. The mass is indeterminate but could reflect metastatic disease given the history of lung cancer. Significant edema in the underlying frontal parenchyma with mild mass effect but no midline shift. Electronically Signed   By: Valetta Mole M.D.   On: 01/07/2023 15:00   CT CHEST ABDOMEN PELVIS W CONTRAST  Result Date: 01/06/2023 CLINICAL DATA:  Patient found down.  Non-small-cell lung cancer EXAM: CT CHEST, ABDOMEN, AND PELVIS WITH CONTRAST TECHNIQUE: Multidetector CT imaging of the chest, abdomen and pelvis was performed following the standard protocol  during bolus administration of intravenous contrast. RADIATION DOSE REDUCTION: This exam was performed according to the departmental dose-optimization program which includes automated exposure control, adjustment of the mA and/or kV according to patient size and/or use of iterative reconstruction technique. CONTRAST:  182mL OMNIPAQUE IOHEXOL 300 MG/ML  SOLN COMPARISON:  CT chest 12/16/2021 and older. Abdomen and pelvis study 03/27/2021. FINDINGS: CT CHEST FINDINGS Cardiovascular: Heart is nonenlarged. No significant pericardial effusion. Scattered coronary artery calcifications are identified. There is also some calcifications towards the mitral valve annulus. The thoracic aorta has some partially calcified atherosclerotic plaque without dissection or aneurysm formation. Plaque extends along the origins of the great vessels. Slight ectasia of the distal descending thoracic aorta on axial image 38, nonspecific. There is some pulsation artifact along the ascending aorta but no mediastinal hematoma. Mediastinum/Nodes: Thyroid gland is small. No specific abnormal lymph node enlargement identified in the axillary regions, left hilum or mediastinum. There are some small nodes along the supraclavicular region on the left such as series 3, image 8 measuring 10 by 7 mm, unchanged. Nodular fullness along the right hilum is again seen with some small nodes. This includes series 3, image 35 measuring 17 by 12 mm. This is unchanged in retrospect from previous. The small node more superior at 6 mm in short axis is not well seen today and may relate to volume averaging. Lungs/Pleura: Centrilobular emphysematous lung changes. No pleural effusion or pneumothorax. Left lung has some basilar scarring and atelectatic areas. The masslike opacity extending from the right hilum into the right lower lobe is stable when taking into account technical differences. There is some increasing atelectasis or opacity along the inferior middle lobe  with some reticular and nodular changes. Recommend short follow-up. Musculoskeletal: Mild degenerative changes seen along the spine. Once again there is augmentation cement along the L2 compression vertebral body. CT ABDOMEN PELVIS FINDINGS Hepatobiliary: No space-occupying liver lesion. Gallbladder is mildly distended. Patent portal vein. Pancreas: Mild pancreatic atrophy. Spleen: No splenic enlargement or mass.  Small lateral splenule Adrenals/Urinary Tract: Adrenal glands are preserved. Mild renal atrophy. Bosniak 1 left-sided renal cyst anteriorly in the midportion is stable. No specific imaging follow-up. No collecting system dilatation. The uterus has a normal course and caliber down to the bladder. Preserved contours of the urinary bladder. Stomach/Bowel: On this non oral contrast exam, the large bowel has a normal course and caliber with scattered colonic stool. Diffuse scattered colonic diverticula are identified. Appendix is not seen. No pericecal stranding or fluid. The stomach is relatively collapsed. Small bowel is nondilated. Vascular/Lymphatic: Normal caliber aorta and IVC with scattered atherosclerotic changes. Retroaortic left renal vein. Areas of stenosis along the iliac vessels to the atherosclerotic changes.  Reproductive: Status post hysterectomy. No adnexal masses. Other: No ascites.  Small fat containing umbilical hernia. Musculoskeletal: Curvature and degenerative changes along the spine. Of note there is streak artifact as the the left arm was scanned across the abdomen. IMPRESSION: Relatively stable appearance to the right hilar lymph node enlargement and soft tissue mass extending into the right lower lobe consistent with known history of lung cancer. Increasing opacity along the inferior middle lobe. Recommend follow-up. No pneumothorax or effusion. No bowel obstruction, free air or free fluid. No evidence of solid organ injury. Colonic diverticulosis. Stable compression of L2 with  augmentation cement. Please see separate dictation of numerous other CT examinations from same day Electronically Signed   By: Jill Side M.D.   On: 01/06/2023 19:17   CT T-SPINE NO CHARGE  Result Date: 01/06/2023 CLINICAL DATA:  Found down EXAM: CT Thoracic and Lumbar spine without contrast TECHNIQUE: Multiplanar CT images of the thoracic and lumbar spine were reconstructed from contemporary CT of the Chest, Abdomen, and Pelvis. RADIATION DOSE REDUCTION: This exam was performed according to the departmental dose-optimization program which includes automated exposure control, adjustment of the mA and/or kV according to patient size and/or use of iterative reconstruction technique. CONTRAST:  No additional contrast COMPARISON:  None Available. FINDINGS: CT THORACIC SPINE FINDINGS Alignment: Normal. Vertebrae: No acute fracture or focal pathologic process. Disc levels: No spinal canal stenosis CT LUMBAR SPINE FINDINGS Segmentation: 5 lumbar type vertebrae. Alignment: Normal Vertebrae: Chronic L2 compression deformity with post augmentation changes. No acute fracture. No lytic or blastic osseous lesion. Disc levels: No spinal canal stenosis. IMPRESSION: 1. No acute fracture or static subluxation of the thoracic or lumbar spine. 2. Chronic L2 compression deformity with post augmentation changes. Electronically Signed   By: Ulyses Jarred M.D.   On: 01/06/2023 19:12   CT L-SPINE NO CHARGE  Result Date: 01/06/2023 CLINICAL DATA:  Found down EXAM: CT Thoracic and Lumbar spine without contrast TECHNIQUE: Multiplanar CT images of the thoracic and lumbar spine were reconstructed from contemporary CT of the Chest, Abdomen, and Pelvis. RADIATION DOSE REDUCTION: This exam was performed according to the departmental dose-optimization program which includes automated exposure control, adjustment of the mA and/or kV according to patient size and/or use of iterative reconstruction technique. CONTRAST:  No additional  contrast COMPARISON:  None Available. FINDINGS: CT THORACIC SPINE FINDINGS Alignment: Normal. Vertebrae: No acute fracture or focal pathologic process. Disc levels: No spinal canal stenosis CT LUMBAR SPINE FINDINGS Segmentation: 5 lumbar type vertebrae. Alignment: Normal Vertebrae: Chronic L2 compression deformity with post augmentation changes. No acute fracture. No lytic or blastic osseous lesion. Disc levels: No spinal canal stenosis. IMPRESSION: 1. No acute fracture or static subluxation of the thoracic or lumbar spine. 2. Chronic L2 compression deformity with post augmentation changes. Electronically Signed   By: Ulyses Jarred M.D.   On: 01/06/2023 19:12   CT HEAD WO CONTRAST  Result Date: 01/06/2023 CLINICAL DATA:  Found down EXAM: CT HEAD WITHOUT CONTRAST CT CERVICAL SPINE WITHOUT CONTRAST TECHNIQUE: Multidetector CT imaging of the head and cervical spine was performed following the standard protocol without intravenous contrast. Multiplanar CT image reconstructions of the cervical spine were also generated. RADIATION DOSE REDUCTION: This exam was performed according to the departmental dose-optimization program which includes automated exposure control, adjustment of the mA and/or kV according to patient size and/or use of iterative reconstruction technique. COMPARISON:  None Available. FINDINGS: CT HEAD FINDINGS Brain: There is an intraparenchymal mass in the left frontal lobe  that measures 3.0 x 3.3 cm. There is moderate surrounding vasogenic edema with rightward midline shift of approximately 2 mm. No hydrocephalus. No acute hemorrhage or extra-axial collection. Vascular: Atherosclerotic calcification of the internal carotid arteries at the skull base. No abnormal hyperdensity of the major intracranial arteries or dural venous sinuses. Skull: The visualized skull base, calvarium and extracranial soft tissues are normal. Sinuses/Orbits: No fluid levels or advanced mucosal thickening of the visualized  paranasal sinuses. No mastoid or middle ear effusion. The orbits are normal. CT CERVICAL SPINE FINDINGS Alignment: No static subluxation. Facets are aligned. Occipital condyles are normally positioned. Skull base and vertebrae: No acute fracture. Soft tissues and spinal canal: No prevertebral fluid or swelling. No visible canal hematoma. Disc levels: Multilevel degenerative disc disease without spinal canal stenosis. Upper chest: No pneumothorax, pulmonary nodule or pleural effusion. Other: Normal visualized paraspinal cervical soft tissues. IMPRESSION: 1. Intraparenchymal mass in the left frontal lobe with moderate surrounding vasogenic edema and 2 mm rightward midline shift. MRI with and without contrast is recommended for further characterization. 2. No acute fracture or static subluxation of the cervical spine. Electronically Signed   By: Ulyses Jarred M.D.   On: 01/06/2023 19:08   CT CERVICAL SPINE WO CONTRAST  Result Date: 01/06/2023 CLINICAL DATA:  Found down EXAM: CT HEAD WITHOUT CONTRAST CT CERVICAL SPINE WITHOUT CONTRAST TECHNIQUE: Multidetector CT imaging of the head and cervical spine was performed following the standard protocol without intravenous contrast. Multiplanar CT image reconstructions of the cervical spine were also generated. RADIATION DOSE REDUCTION: This exam was performed according to the departmental dose-optimization program which includes automated exposure control, adjustment of the mA and/or kV according to patient size and/or use of iterative reconstruction technique. COMPARISON:  None Available. FINDINGS: CT HEAD FINDINGS Brain: There is an intraparenchymal mass in the left frontal lobe that measures 3.0 x 3.3 cm. There is moderate surrounding vasogenic edema with rightward midline shift of approximately 2 mm. No hydrocephalus. No acute hemorrhage or extra-axial collection. Vascular: Atherosclerotic calcification of the internal carotid arteries at the skull base. No abnormal  hyperdensity of the major intracranial arteries or dural venous sinuses. Skull: The visualized skull base, calvarium and extracranial soft tissues are normal. Sinuses/Orbits: No fluid levels or advanced mucosal thickening of the visualized paranasal sinuses. No mastoid or middle ear effusion. The orbits are normal. CT CERVICAL SPINE FINDINGS Alignment: No static subluxation. Facets are aligned. Occipital condyles are normally positioned. Skull base and vertebrae: No acute fracture. Soft tissues and spinal canal: No prevertebral fluid or swelling. No visible canal hematoma. Disc levels: Multilevel degenerative disc disease without spinal canal stenosis. Upper chest: No pneumothorax, pulmonary nodule or pleural effusion. Other: Normal visualized paraspinal cervical soft tissues. IMPRESSION: 1. Intraparenchymal mass in the left frontal lobe with moderate surrounding vasogenic edema and 2 mm rightward midline shift. MRI with and without contrast is recommended for further characterization. 2. No acute fracture or static subluxation of the cervical spine. Electronically Signed   By: Ulyses Jarred M.D.   On: 01/06/2023 19:08   DG Chest Port 1 View  Result Date: 01/06/2023 CLINICAL DATA:  Altered mental status. EXAM: PORTABLE CHEST 1 VIEW COMPARISON:  November 26, 2022 FINDINGS: The study is limited secondary to patient rotation. The heart size and mediastinal contours are within normal limits. A small amount of curvilinear radiopaque material is again seen overlying the right hilum. Mild linear atelectasis is noted within the bilateral lung bases. There is no evidence of an acute infiltrate, pleural  effusion or pneumothorax. No acute osseous abnormalities identified. IMPRESSION: Mild bibasilar linear atelectasis. Electronically Signed   By: Virgina Norfolk M.D.   On: 01/06/2023 17:31      LOS: 2 days    Cordelia Poche, MD [facility] 01/08/2023, 2:33 PM   If 7PM-7AM, please contact  night-coverage [email or web address]

## 2023-01-08 NOTE — Progress Notes (Addendum)
[facility]) Hospital Liaison Note  Received request from Dessa Phi, [facility] for family interest in [facility].  Informed that [facility] is closed for renovations at the moment, but that we do have bed availability at the [facility]. Son was not interested in pursuing a bed at the [facility], but chose [facility] instead.   Received notification that the patient was not approved for IPU at [facility]. Son requested that we evaluate to see if she would be approved for the [facility]. Upon further evaluation and speaking with Hospice MD, she is not approved for the Hospice Home.   Please do not hesitate to call with any questions.    Thank you, Zigmund Gottron RN  [facility] Liaison [phone]

## 2023-01-08 NOTE — Progress Notes (Signed)
I have reviewed the brain MRI revealing an ~3.5cm left frontal possible extra-axial lesion with associated edema likely representing metastatic disease from known non-small cell lung primary. Aggressive treatment could include resection with pre- or post-op radiation however in an 88yo woman with now stage 4 metastatic lung cancer, this seems somewhat unreasonable. It appears plan is for transition to facility based hospice. Could consider palliative brain radiation per rad onc. I am available for any further questions.  Consuella Lose, MD [facility]

## 2023-01-09 DIAGNOSIS — I10 Essential (primary) hypertension: Secondary | ICD-10-CM | POA: Diagnosis not present

## 2023-01-09 DIAGNOSIS — F419 Anxiety disorder, unspecified: Secondary | ICD-10-CM | POA: Diagnosis not present

## 2023-01-09 DIAGNOSIS — G936 Cerebral edema: Secondary | ICD-10-CM | POA: Diagnosis not present

## 2023-01-09 DIAGNOSIS — W19XXXA Unspecified fall, initial encounter: Secondary | ICD-10-CM | POA: Diagnosis not present

## 2023-01-09 NOTE — Progress Notes (Signed)
  Daily Progress Note   Patient Name: REGINE CHRISTIAN       Date: 01/09/2023 DOB: 07/18/1935  Age: 88 y.o. MRN#: [id number] Attending Physician: Mariel Aloe, MD Primary Care Physician: [name], MD Admit Date: 01/06/2023 Length of Stay: 3 days  Discussed care with primary hospitalist today. 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 Patient and family already discussed with care providers. Planning to go to SNF for rehab with outpatient radiation follow up. As goals for medical care are currently determined, inpatient palliative medicine consult will be canceled. Have placed TOC referral to assist with coordination of outpatient palliative medicine follow up. Please reach out if our team can be of further assistance in the future. Thank you.    Chelsea Aus, DO Palliative Care Provider PMT # [phone]

## 2023-01-09 NOTE — TOC Progression Note (Addendum)
Transition of Care [facility]) - Progression Note    Patient Details  Name: Heather [name] MRN: [id number] Date of Birth: 02/05/1935  Transition of Care [facility]) CM/SW Contact  Medha Pippen, Juliann Pulse, RN Phone Number: 01/09/2023, 11:59 AM  Clinical Narrative:  [facility] care has declined for residential hospice in [place] declined for residential hospice in [place]. Will recc PT to eval for ST SNF. MD notified if agree to place order. Spoke to son [name] about current recc for PT to eval for ST SNF-Steve adamant that patient cannot go home-lives alone. Informed [name] of Dept SS for applying for medicaid. Will await outcome of PT eval if ordered. -2:15p-awaiting PT recc. Referral for otpt Palliative Care Services.    Expected Discharge Plan: Skilled Nursing Facility Barriers to Discharge: Continued Medical Work up  Expected Discharge Plan and Services                                               Social Determinants of Health (SDOH) Interventions SDOH Screenings   Food Insecurity: No Food Insecurity (01/07/2023)  Housing: Low Risk  (01/07/2023)  Transportation Needs: No Transportation Needs (01/07/2023)  Utilities: Not At Risk (01/07/2023)  Alcohol Screen: Low Risk  (06/25/2022)  Depression (PHQ2-9): Low Risk  (12/30/2022)  Financial Resource Strain: Low Risk  (06/25/2022)  Physical Activity: Inactive (06/25/2022)  Social Connections: Socially Isolated (06/25/2022)  Stress: No Stress Concern Present (06/25/2022)  Tobacco Use: Medium Risk (01/07/2023)    Readmission Risk Interventions    01/07/2023   10:59 AM  Readmission Risk Prevention Plan  Transportation Screening Complete  PCP or Specialist Appt within 3-5 Days Complete  HRI or [place] Complete  Social Work Consult for [place] Planning/Counseling Complete  Palliative Care Screening Complete  Medication Review Press photographer) Complete

## 2023-01-09 NOTE — Evaluation (Signed)
Physical Therapy Evaluation Patient Details Name: Heather [name] MRN: [id number] DOB: Aug 25, 1935 Today's Date: 01/09/2023  History of Present Illness  Pt is 88 yo female admitted after fall at home on 01/06/23.  Pt found to have CAP as well as vasogenic brain edema likely metastatic brain lesion (frontal lobe mass) from non-small cell lung CA. Pt with hx including but not limited to lung CA, anxiety, depression, hypothyroidism.  Clinical Impression  Pt admitted with above diagnosis. At baseline, pt resides alone, independent with adls and light iadls, and can ambulate with RW.  Noted that community hospice had been pursued but pt did not qualify and now seeking SNF placement.  Pt was lethargic at time of evaluation (just up with mobility then bath, just prior to arrival).  She required mod A for transfers, poor balance, and only able to ambulate a few feet.  Pt requiring increased time and cues for transfers and sequencing.  Pt is well below her baseline and not safe to return home alone from PT perspective - recommend SNF.  Pt currently with functional limitations due to the deficits listed below (see PT Problem List). Pt will benefit from skilled PT to increase their independence and safety with mobility to allow discharge to the venue listed below.          Recommendations for follow up therapy are one component of a multi-disciplinary discharge planning process, led by the attending physician.  Recommendations may be updated based on patient status, additional functional criteria and insurance authorization.  Follow Up Recommendations Skilled nursing-short term rehab (<3 hours/day) Can patient physically be transported by private vehicle: Yes    Assistance Recommended at Discharge Frequent or constant Supervision/Assistance  Patient can return home with the following  A lot of help with walking and/or transfers;A lot of help with bathing/dressing/bathroom;Assistance with  cooking/housework    Equipment Recommendations None recommended by PT  Recommendations for Other Services       Functional Status Assessment Patient has had a recent decline in their functional status and demonstrates the ability to make significant improvements in function in a reasonable and predictable amount of time.     Precautions / Restrictions Precautions Precautions: Fall      Mobility  Bed Mobility Overal bed mobility: Needs Assistance Bed Mobility: Supine to Sit, Sit to Supine     Supine to sit: Mod assist Sit to supine: Mod assist   General bed mobility comments: increased time and cues with assist for legs and trunk    Transfers Overall transfer level: Needs assistance Equipment used: Rolling walker (2 wheels) Transfers: Sit to/from Stand Sit to Stand: Mod assist                Ambulation/Gait Ambulation/Gait assistance: Min assist Gait Distance (Feet): 8 Feet Assistive device: Rolling walker (2 wheels) Gait Pattern/deviations: Step-to pattern, Decreased stride length Gait velocity: decreased     General Gait Details: min A balance; limited distance due to fatigue/lethargy  Stairs            Wheelchair Mobility    Modified Rankin (Stroke Patients Only)       Balance Overall balance assessment: Needs assistance Sitting-balance support: No upper extremity supported Sitting balance-Leahy Scale: Fair Sitting balance - Comments: initially requiring min A with posterior lean and kept extending knees; after 2-3 mins able to get feet on floor and maintain with supervision   Standing balance support: Bilateral upper extremity supported Standing balance-Leahy Scale: Poor Standing balance comment: RW and  min A                             Pertinent Vitals/Pain Pain Assessment Pain Assessment: No/denies pain    Home Living Family/patient expects to be discharged to:: Skilled nursing facility (from home) Living Arrangements:  Alone Available Help at Discharge: Family;Available PRN/intermittently Type of Home: House Home Access: Stairs to enter Entrance Stairs-Rails: Right;Left;Can reach both Entrance Stairs-Number of Steps: 2   Home Layout: One level Home Equipment: Conservation officer, nature (2 wheels);Wheelchair - manual      Prior Function Prior Level of Function : Independent/Modified Independent             Mobility Comments: Ambulates short community distances with RW ADLs Comments: Pt reports independent with adls and light iadls; son does driving and shopping     Hand Dominance        Extremity/Trunk Assessment   Upper Extremity Assessment Upper Extremity Assessment: Generalized weakness;Difficult to assess due to impaired cognition    Lower Extremity Assessment Lower Extremity Assessment: Generalized weakness;Difficult to assess due to impaired cognition    Cervical / Trunk Assessment Cervical / Trunk Assessment: Normal  Communication   Communication: No difficulties  Cognition Arousal/Alertness: Lethargic Behavior During Therapy: WFL for tasks assessed/performed (mild anxious) Overall Cognitive Status: Impaired/Different from baseline Area of Impairment: Orientation, Problem solving, Following commands, Safety/judgement, Awareness, Attention, Memory                 Orientation Level: Disoriented to, Time, Situation Current Attention Level: Sustained Memory: Decreased short-term memory Following Commands: Follows one step commands inconsistently, Follows one step commands with increased time   Awareness: Emergent Problem Solving: Slow processing, Requires verbal cues, Requires tactile cues General Comments: Pt initially pleasant but then became irritated when asked to get OOB even though discussed it upon initial arrival. Able to educate and encourage and pt agreeable.        General Comments General comments (skin integrity, edema, etc.): VSS; nephew and his wife present -  assisting and encouraging pt    Exercises     Assessment/Plan    PT Assessment Patient needs continued PT services  PT Problem List Decreased strength;Decreased coordination;Decreased range of motion;Decreased cognition;Decreased activity tolerance;Decreased knowledge of use of DME;Decreased balance;Decreased mobility;Decreased knowledge of precautions;Decreased safety awareness       PT Treatment Interventions DME instruction;Therapeutic exercise;Wheelchair mobility training;Gait training;Balance training;Stair training;Functional mobility training;Cognitive remediation;Therapeutic activities;Patient/family education    PT Goals (Current goals can be found in the Care Plan section)  Acute Rehab PT Goals Patient Stated Goal: not able to state; per chart son interested in SNF; they tried to pursue hospice but pt did not qualify PT Goal Formulation: With patient Time For Goal Achievement: 01/23/23 Potential to Achieve Goals: Fair    Frequency Min 2X/week     Co-evaluation               AM-PAC PT "6 Clicks" Mobility  Outcome Measure Help needed turning from your back to your side while in a flat bed without using bedrails?: A Little Help needed moving from lying on your back to sitting on the side of a flat bed without using bedrails?: A Lot Help needed moving to and from a bed to a chair (including a wheelchair)?: A Lot Help needed standing up from a chair using your arms (e.g., wheelchair or bedside chair)?: A Lot Help needed to walk in hospital room?: Total Help needed climbing  3-5 steps with a railing? : Total 6 Click Score: 11    End of Session Equipment Utilized During Treatment: Gait belt Activity Tolerance: Patient limited by fatigue;Patient limited by lethargy (recently up with mobility then had bath - fatigued) Patient left: in bed;with call bell/phone within reach;with bed alarm set;with family/visitor present Nurse Communication: Mobility status PT Visit  Diagnosis: Other abnormalities of gait and mobility (R26.89);Muscle weakness (generalized) (M62.81)    Time: 2411-4643 PT Time Calculation (min) (ACUTE ONLY): 24 min   Charges:   PT Evaluation $PT Eval Low Complexity: 1 Low PT Treatments $Therapeutic Activity: 8-22 mins        Abran Richard, PT Acute Rehab [facility] [phone]   Karlton Lemon 01/09/2023, 4:22 PM

## 2023-01-09 NOTE — Progress Notes (Signed)
Mobility Specialist - Progress Note   01/09/23 1447  Mobility  Activity Transferred to/from [facility]  Level of Assistance Contact guard assist, steadying assist  Assistive Device Other (Comment) (HHA)  Activity Response Tolerated fair  $Mobility charge 1 Mobility   Pt was found sitting EOB and stated needing to urinate. Pt was very confused throughout session and at EOS was left in bed with NT in room.  Ferd Hibbs Mobility Specialist

## 2023-01-09 NOTE — Progress Notes (Signed)
PROGRESS NOTE    Heather AUZENNE  [name]:[id number] DOB: 05-20-1935 DOA: 01/06/2023 PCP: [name], MD   Brief Narrative: Heather [name] is a 88 y.o. female with a history of lung cancer, anxiety, depression, anxiety, hypothyroidism. Patient presented secondary to falling and found to have evidence of likely pneumonia. Empiric antibiotics started. Brain imaging revealed a frontal lobe mass with associated vasogenic edema. Steroids started for treatment. Neurosurgery consulted with recommendation for no surgical management. Radiation oncology consulted for recommendations. Patient and family are interested in hospice, but at this time, patient is not a candidate for inpatient hospice.   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 Neurosurgery consulted with recommendation for no surgical management and consideration of radiation therapy. Patient started on decadron IV. Radiation oncology recommending outpatient follow-up to consider SBRT. -Continue Decadron PO   Non-small cell lung cancer Presumed diagnosis. Patient has a history of SBRT but declined chemotherapy. Patient has not followed with oncology since 2022.   Community acquired pneumonia CT evidence of possible inferior middle lobe infiltrate, with associated mild leukocytosis and fever. Patient met sepsis criteria on admission. Patient started empirically on Zosyn. -Continue Zosyn IV; transition to PO antibiotic for discharge   Hypokalemia Supplementation given. Resolved.   COPD Stable. -Continue Breo Ellipta   Fall Likely related to brain legion/edema. PT consulted, although patient/family interested in hospice services.   Anxiety Depression -Continue  Zoloft  Hypothyroidism -Continue Synthroid   Primary hypertension Patient is on losartan as an outpatient, which is held.   Postherpetic neuralgia Patient is on Lyrica as an outpatient, which is currently held.   DVT prophylaxis: SCDs Code Status:   Code Status: DNR Family Communication: Son at bedside Disposition Plan: Discharge to SNF pending PT/OT eval/recommendations and TOC. Medically stable for discharge.   Consultants:  Neurosurgery Radiation oncology  Procedures:  None  Antimicrobials: Zosyn IV    Subjective: Patient did not sleep well last night. She has things on her mind but was unable/unwilling to discuss today. No other concerns.  Objective: BP (!) 141/61 (BP Location: Left Arm)   Pulse (!) 58   Temp 98.2 F (36.8 C) (Oral)   Resp 16   Ht 4\' 11"  (1.499 m)   Wt 51.5 kg   SpO2 96%   BMI 22.92 kg/m   Examination:  General exam: Appears calm and comfortable Respiratory system: Clear to auscultation. Respiratory effort normal. Cardiovascular system: S1 & S2 heard, RRR. Gastrointestinal system: Abdomen is nondistended, soft and nontender. Normal bowel sounds heard. Central nervous system: Alert and oriented.  Psychiatry: Judgement and insight appear normal. Mood & affect appropriate.     Data Reviewed: I have personally reviewed following labs and imaging studies  CBC Lab Results  Component Value Date   WBC 8.0 01/07/2023   RBC 3.56 (L) 01/07/2023   HGB 10.6 (L) 01/07/2023   HCT 33.8 (L) 01/07/2023   MCV 94.9 01/07/2023   MCH 29.8 01/07/2023   PLT 237 01/07/2023   MCHC 31.4 01/07/2023   RDW 13.6 01/07/2023   LYMPHSABS 0.7 01/07/2023   MONOABS 0.3 01/07/2023   EOSABS 0.0 01/07/2023   BASOSABS 0.0 81/19/1478     Last metabolic panel Lab Results  Component Value Date   NA 135 01/07/2023   K 3.7 01/07/2023   CL 102  01/07/2023   CO2 22 01/07/2023   BUN 11 01/07/2023   CREATININE 0.92 01/07/2023   GLUCOSE 166 (H) 01/07/2023    GFRNONAA >60 01/07/2023   GFRAA >60 07/31/2020   CALCIUM 8.5 (L) 01/07/2023   PROT 6.3 (L) 01/07/2023   ALBUMIN 3.0 (L) 01/07/2023   LABGLOB 3.2 04/10/2021   AGRATIO 1.2 04/10/2021   BILITOT 1.0 01/07/2023   ALKPHOS 51 01/07/2023   AST 24 01/07/2023   ALT 13 01/07/2023   ANIONGAP 11 01/07/2023    GFR: Estimated Creatinine Clearance: 30.5 mL/min (by C-G formula based on SCr of 0.92 mg/dL).  Recent Results (from the past 240 hour(s))  Culture, blood (Routine X 2) w Reflex to ID Panel     Status: None (Preliminary result)   Collection Time: 01/06/23  5:29 PM   Specimen: BLOOD RIGHT HAND  Result Value Ref Range Status   Specimen Description   Final    BLOOD RIGHT HAND Performed at [facility], [place] [address], [place], [place] [address]    Special Requests   Final    BOTTLES DRAWN AEROBIC AND ANAEROBIC Blood Culture results may not be optimal due to an inadequate volume of blood received in culture bottles Performed at [facility] [address], [place], [place] [address]    Culture   Final    NO GROWTH 3 DAYS Performed at [facility], [place] [address] [place], [place] [address]    Report Status PENDING  Incomplete  Culture, blood (Routine X 2) w Reflex to ID Panel     Status: None (Preliminary result)   Collection Time: 01/06/23  5:32 PM   Specimen: BLOOD  Result Value Ref Range Status   Specimen Description   Final    BLOOD RIGHT ANTECUBITAL Performed at [facility] [address], [place], [place] [address]    Special Requests   Final    BOTTLES DRAWN AEROBIC AND ANAEROBIC Blood Culture adequate volume Performed at [facility] [address], [place], [place] [address]    Culture   Final    NO GROWTH 3 DAYS Performed at [facility], [place] [address], [place], [place] [address]    Report Status PENDING  Incomplete      Radiology Studies: MR Brain W and Wo  Contrast  Result Date: 01/07/2023 CLINICAL DATA:  Brain mass EXAM: MRI HEAD WITHOUT AND WITH CONTRAST TECHNIQUE: Multiplanar, multiecho pulse sequences of the brain and surrounding structures were obtained without and with intravenous contrast. CONTRAST:  61mL GADAVIST GADOBUTROL 1 MMOL/ML IV SOLN COMPARISON:  CT head 1 day prior FINDINGS: Brain: Again seen is a solid mass in the left frontal region measuring 3.0 cm AP x 2.8 cm TV by 3.4 cm cc. There is heterogeneous enhancement with central non enhancement. Foci of SWI signal dropout may reflect blood or mineralization. There is patchy diffusion restriction within the mass. The mass appears extra-axial particularly on the T2 sequences (12-23, 19-16). No dural tail is seen. There is significant edema in the underlying frontal lobe parenchyma extending to the external capsule. Mass effect results in partial effacement of the left lateral ventricle but no midline shift. There is no evidence of acute infarct. Background parenchymal volume is normal for age. The ventricles are otherwise normal in size. Patchy FLAIR signal abnormality in the remainder of the supratentorial white matter likely reflects underlying chronic small-vessel ischemic change. The pituitary and suprasellar region are normal. There is  no other abnormal enhancement. Vascular: Normal flow voids. Skull and upper cervical spine: Normal marrow signal. Sinuses/Orbits: The paranasal sinuses are clear. The globes and orbits are unremarkable. Other: None. IMPRESSION: Solid mass in the left frontal region appears extra-axial. The mass is indeterminate but could reflect metastatic disease given the history of lung cancer. Significant edema in the underlying frontal parenchyma with mild mass effect but no midline shift. Electronically Signed   By: Valetta Mole M.D.   On: 01/07/2023 15:00      LOS: 3 days    Cordelia Poche, MD [facility] 01/09/2023, 12:25 PM   If 7PM-7AM, please contact  night-coverage [email or web address]

## 2023-01-10 DIAGNOSIS — W19XXXA Unspecified fall, initial encounter: Secondary | ICD-10-CM | POA: Diagnosis not present

## 2023-01-10 DIAGNOSIS — G936 Cerebral edema: Secondary | ICD-10-CM | POA: Diagnosis not present

## 2023-01-10 DIAGNOSIS — F419 Anxiety disorder, unspecified: Secondary | ICD-10-CM | POA: Diagnosis not present

## 2023-01-10 DIAGNOSIS — I10 Essential (primary) hypertension: Secondary | ICD-10-CM | POA: Diagnosis not present

## 2023-01-10 MED ORDER — DOXEPIN HCL 10 MG PO CAPS
10.0000 mg | ORAL_CAPSULE | Freq: Every day | ORAL | Status: DC
  Administered 2023-01-10 – 2023-01-13 (×4): 10 mg via ORAL
  Filled 2023-01-10 (×4): qty 1

## 2023-01-10 MED ORDER — GUAIFENESIN-DM 100-10 MG/5ML PO SYRP
5.0000 mL | ORAL_SOLUTION | ORAL | Status: DC | PRN
  Administered 2023-01-10: 5 mL via ORAL
  Filled 2023-01-10: qty 10

## 2023-01-10 MED ORDER — LOSARTAN POTASSIUM 25 MG PO TABS
25.0000 mg | ORAL_TABLET | Freq: Every day | ORAL | Status: DC
  Administered 2023-01-10 – 2023-01-14 (×5): 25 mg via ORAL
  Filled 2023-01-10 (×5): qty 1

## 2023-01-10 NOTE — Plan of Care (Signed)
  Problem: Clinical Measurements: Goal: Ability to maintain clinical measurements within normal limits will improve Outcome: Progressing Goal: Will remain free from infection Outcome: Progressing   Problem: Elimination: Goal: Will not experience complications related to bowel motility Outcome: Progressing   Problem: Safety: Goal: Ability to remain free from injury will improve Outcome: Progressing

## 2023-01-10 NOTE — Progress Notes (Signed)
Pharmacy Antibiotic Note  Heather [name] is a 88 y.o. female admitted on 01/06/2023 s/p fall.  Patient with h/o lung cancer. MRI brain shows mass concerning for possible metastatic disease. Pharmacy has been consulted for Zosyn dosing for CAP.  Plan: Zosyn 3.375g IV q8h (4 hour infusion). Sign off consult - plan for PO at discharge  Height: 4\' 11"  (149.9 cm) Weight: 51.5 kg (113 lb 8 oz) IBW/kg (Calculated) : 43.2  Temp (24hrs), Avg:98.1 F (36.7 C), Min:97.8 F (36.6 C), Max:98.4 F (36.9 C)  Recent Labs  Lab 01/06/23 1736 01/06/23 1738 01/07/23 0529  WBC  --  11.7* 8.0  CREATININE  --  0.97 0.92  LATICACIDVEN 1.3  --   --      Estimated Creatinine Clearance: 30.5 mL/min (by C-G formula based on SCr of 0.92 mg/dL).    Allergies  Allergen Reactions   Tylenol [Acetaminophen] Swelling and Other (See Comments)    Nightmares, tongue swelling     Antimicrobials this admission: 1/30 Zosyn >>    Dose adjustments this admission: NA    Microbiology results: 1/30 BCx:  ngtd    Thank you for allowing pharmacy to be a part of this patient's care.  Tawnya Crook, PharmD, BCPS Clinical Pharmacist 01/10/2023 10:27 AM

## 2023-01-10 NOTE — Progress Notes (Signed)
PROGRESS NOTE    Heather [name]  AST:[id number] DOB: 08/25/1935 DOA: 01/06/2023 PCP: [name], MD   Brief Narrative: Heather [name] is a 88 y.o. female with a history of lung cancer, anxiety, depression, anxiety, hypothyroidism. Patient presented secondary to falling and found to have evidence of likely pneumonia. Empiric antibiotics started. Brain imaging revealed a frontal lobe mass with associated vasogenic edema. Steroids started for treatment. Neurosurgery consulted with recommendation for no surgical management. Radiation oncology consulted for recommendations. Patient and family are interested in hospice, but at this time, patient is not a candidate for inpatient hospice.   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 Neurosurgery consulted with recommendation for no surgical management and consideration of radiation therapy. Patient started on decadron IV. Radiation oncology recommending outpatient follow-up to consider SBRT. -Continue Decadron PO -Outpatient palliative care follow-up   Non-small cell lung cancer Presumed diagnosis. Patient has a history of SBRT but declined chemotherapy. Patient has not followed with oncology since 2022.   Community acquired pneumonia CT evidence of possible inferior middle lobe infiltrate, with associated mild leukocytosis and fever. Patient met sepsis criteria on admission. Patient started empirically on Zosyn. -Continue Zosyn IV; transition to PO antibiotic for discharge   Hypokalemia Supplementation given. Resolved.   COPD Stable. -Continue Breo Ellipta   Fall Likely related to brain legion/edema. PT consulted, although patient/family interested in hospice services.    Anxiety Depression -Continue Zoloft  Insomnia Chronic issue. -Restart home doxepin  Hypothyroidism -Continue Synthroid   Primary hypertension Patient is on losartan as an outpatient, which is held. -Restart home losartan   Postherpetic neuralgia Patient is on Lyrica as an outpatient, which is currently held.   DVT prophylaxis: SCDs Code Status:   Code Status: DNR Family Communication: None at bedside Disposition Plan: Discharge to SNF pending bed availability. Medically stable for discharge.   Consultants:  Neurosurgery Radiation oncology  Procedures:  None  Antimicrobials: Zosyn IV    Subjective: Patient reports not having good sleep again last night. No other issues noted.  Objective: BP (!) 152/48 (BP Location: Left Arm)   Pulse 67   Temp 97.8 F (36.6 C) (Oral)   Resp 15   Ht 4\' 11"  (1.499 m)   Wt 51.5 kg   SpO2 99%   BMI 22.92 kg/m   Examination:  General: Well appearing, no distress    Data Reviewed: I have personally reviewed following labs and imaging studies  CBC Lab Results  Component Value Date   WBC 8.0 01/07/2023   RBC 3.56 (L) 01/07/2023   HGB 10.6 (L) 01/07/2023   HCT 33.8 (L) 01/07/2023   MCV 94.9 01/07/2023   MCH 29.8 01/07/2023   PLT 237 01/07/2023   MCHC 31.4 01/07/2023   RDW 13.6 01/07/2023   LYMPHSABS 0.7 01/07/2023   MONOABS 0.3 01/07/2023   EOSABS 0.0 01/07/2023   BASOSABS 0.0 98/92/1194     Last metabolic panel Lab Results  Component Value Date   NA 135 01/07/2023   K 3.7 01/07/2023   CL 102 01/07/2023   CO2 22 01/07/2023   BUN 11 01/07/2023   CREATININE 0.92 01/07/2023   GLUCOSE 166 (H) 01/07/2023   GFRNONAA >60 01/07/2023   GFRAA >60 07/31/2020   CALCIUM 8.5 (L) 01/07/2023   PROT 6.3 (L) 01/07/2023  ALBUMIN 3.0 (L) 01/07/2023   LABGLOB 3.2 04/10/2021   AGRATIO 1.2 04/10/2021   BILITOT 1.0 01/07/2023   ALKPHOS 51 01/07/2023   AST 24 01/07/2023   ALT 13 01/07/2023   ANIONGAP 11 01/07/2023     GFR: Estimated Creatinine Clearance: 30.5 mL/min (by C-G formula based on SCr of 0.92 mg/dL).  Recent Results (from the past 240 hour(s))  Culture, blood (Routine X 2) w Reflex to ID Panel     Status: None (Preliminary result)   Collection Time: 01/06/23  5:29 PM   Specimen: BLOOD RIGHT HAND  Result Value Ref Range Status   Specimen Description   Final    BLOOD RIGHT HAND Performed at [facility], [place] [address], [place], [place] [address]    Special Requests   Final    BOTTLES DRAWN AEROBIC AND ANAEROBIC Blood Culture results may not be optimal due to an inadequate volume of blood received in culture bottles Performed at [place] [address] [place], [place] [address]    Culture   Final    NO GROWTH 4 DAYS Performed at [facility], [place] [address] [place], [place] [address]    Report Status PENDING  Incomplete  Culture, blood (Routine X 2) w Reflex to ID Panel     Status: None (Preliminary result)   Collection Time: 01/06/23  5:32 PM   Specimen: BLOOD  Result Value Ref Range Status   Specimen Description   Final    BLOOD RIGHT ANTECUBITAL Performed at [place] [address], [place], [place] [address]    Special Requests   Final    BOTTLES DRAWN AEROBIC AND ANAEROBIC Blood Culture adequate volume Performed at [place] [address] [place], [place] [address]    Culture   Final    NO GROWTH 4 DAYS Performed at [facility], [place] [address], [place], [place] [address]    Report Status PENDING  Incomplete      Radiology Studies: No results found.    LOS: 4 days    Cordelia Poche, MD [facility] 01/10/2023, 10:22 AM   If 7PM-7AM, please contact night-coverage [email or web address]

## 2023-01-10 NOTE — Plan of Care (Signed)
  Problem: Clinical Measurements: Goal: Ability to maintain clinical measurements within normal limits will improve Outcome: Progressing Goal: Will remain free from infection Outcome: Progressing   Problem: Coping: Goal: Level of anxiety will decrease Outcome: Progressing   Problem: Safety: Goal: Ability to remain free from injury will improve Outcome: Progressing

## 2023-01-10 NOTE — Evaluation (Signed)
Occupational Therapy Evaluation Patient Details Name: Heather [name] MRN: [id number] DOB: 11-Sep-1935 Today's Date: 01/10/2023   History of Present Illness Patient is 88 yo female admitted after fall at home on 01/06/23.  Pt found to have CAP as well as vasogenic brain edema likely metastatic brain lesion (frontal lobe mass) from non-small cell lung CA. Pt with hx including but not limited to lung CA, anxiety, depression, hypothyroidism.   Clinical Impression   Patient is a 88 year old female who was admitted for above. Patient was living at home alone with PRN support from son. Patient did have meals on wheels support as well. Patient was noted to need max A for LB dressing tasks with increased pain in back, decreased standing balance, decreased safety awareness/sequencing of tasks and decreased functional activity tolerance impacting participation in ADLs. Patient was noted to have decreased functional activity tolerance, decreased endurance, decreased standing balance, decreased safety awareness, and decreased knowledge of AD/AE impacting participation in ADLs. Patient would continue to benefit from skilled OT services at this time while admitted and after d/c to address noted deficits in order to improve overall safety and independence in ADLs.         Recommendations for follow up therapy are one component of a multi-disciplinary discharge planning process, led by the attending physician.  Recommendations may be updated based on patient status, additional functional criteria and insurance authorization.   Follow Up Recommendations  Skilled nursing-short term rehab (<3 hours/day)     Assistance Recommended at Discharge Frequent or constant Supervision/Assistance  Patient can return home with the following A little help with walking and/or transfers;A lot of help with bathing/dressing/bathroom;Assistance with cooking/housework;Direct supervision/assist for medications management;Assist for  transportation;Help with stairs or ramp for entrance;Direct supervision/assist for financial management    Functional Status Assessment  Patient has had a recent decline in their functional status and demonstrates the ability to make significant improvements in function in a reasonable and predictable amount of time.  Equipment Recommendations  None recommended by OT       Precautions / Restrictions Precautions Precautions: Fall Restrictions Weight Bearing Restrictions: No      Mobility Bed Mobility Overal bed mobility: Needs Assistance Bed Mobility: Supine to Sit, Sit to Supine     Supine to sit: Mod assist Sit to supine: Mod assist   General bed mobility comments: increased time and cues with assist for legs and trunk        Balance Overall balance assessment: Needs assistance Sitting-balance support: No upper extremity supported Sitting balance-Leahy Scale: Fair     Standing balance support: Reliant on assistive device for balance, Bilateral upper extremity supported Standing balance-Leahy Scale: Poor Standing balance comment: refused to remove hands from RW to challange balance                           ADL either performed or assessed with clinical judgement   ADL Overall ADL's : Needs assistance/impaired Eating/Feeding: Supervision/ safety;Sitting   Grooming: Brushing hair;Minimal assistance Grooming Details (indicate cue type and reason): with noted knots in back of head with minimal A offered to attempt to get knots and comb unstuck from knots. patients son and daughter in law present reporting they would try to help patient with it later. Upper Body Bathing: Min guard;Sitting   Lower Body Bathing: Sitting/lateral leans;Sit to/from stand;Maximal assistance   Upper Body Dressing : Min guard;Sitting   Lower Body Dressing: Maximal assistance;Sitting/lateral leans;Sit to/from stand  Lower Body Dressing Details (indicate cue type and reason): patient  declined to participate in simulated standing task withouth BUE on RW. she kept reporting "no" when asked to take one hand off the walker. Toilet Transfer: Min guard;Ambulation;Rolling walker (2 wheels) Toilet Transfer Details (indicate cue type and reason): patient was able to ambulate around the room with min guard at this time. patient's son was present reporting he was unable to assist her at this level and that current functioning was not good enough to go home. patients son also reported that he did not want her to go to rehab but "they tell me she has to" when trying to gather further understanding on level patient needed to be to be able to get support from son at home son was not receptive to questions. Toileting- Clothing Manipulation and Hygiene: Maximal assistance;Sit to/from stand       Functional mobility during ADLs: Min guard;Rolling walker (2 wheels) General ADL Comments: with cues needed for sequncing of task with patient getting irritated at times then smiling afterwards and plesant no warning on changes.     Vision   Additional Comments: patient was not receptive to my questions about vision on this date.            Pertinent Vitals/Pain Pain Assessment Pain Assessment: No/denies pain     Hand Dominance Right   Extremity/Trunk Assessment Upper Extremity Assessment Upper Extremity Assessment: Difficult to assess due to impaired cognition (patient had a hard time following commands with some irritation noted at times during session. seemes to be [facility] to participate in ADL tasks while in room)   Lower Extremity Assessment Lower Extremity Assessment: Defer to PT evaluation   Cervical / Trunk Assessment Cervical / Trunk Assessment: Normal   Communication Communication Communication: No difficulties   Cognition Arousal/Alertness: Awake/alert Behavior During Therapy: Flat affect Overall Cognitive Status: Impaired/Different from baseline                    Orientation Level: Disoriented to, Time, Situation     Following Commands: Follows one step commands inconsistently, Follows one step commands with increased time   Awareness: Emergent Problem Solving: Slow processing, Requires verbal cues, Requires tactile cues General Comments: patient was noted to have moments of irritation during session that would fade. patient's son and daughter in law present during session                Mayetta expects to be discharged to:: Skilled nursing facility Living Arrangements: Alone Available Help at Discharge: Family;Available PRN/intermittently Type of Home: House Home Access: Stairs to enter [employer] of Steps: 2 Entrance Stairs-Rails: Right;Left;Can reach both Home Layout: One level     Bathroom Shower/Tub: Teacher, early years/pre: Standard     Home Equipment: Conservation officer, nature (2 wheels);Wheelchair - manual          Prior Functioning/Environment Prior Level of Function : Independent/Modified Independent             Mobility Comments: Ambulates short community distances with RW ADLs Comments: Pt reports independent with adls and light iadls; son does driving and shopping. son reported being unable to offer 24/7 caregiver support.        OT Problem List: Decreased activity tolerance;Impaired balance (sitting and/or standing);Decreased coordination;Decreased safety awareness;Decreased knowledge of precautions      OT Treatment/Interventions: Self-care/ADL training;Energy conservation;Therapeutic exercise;DME and/or AE instruction;Therapeutic activities;Patient/family education;Balance training    OT Goals(Current goals can be found in  the care plan section) Acute Rehab OT Goals Patient Stated Goal: to go home OT Goal Formulation: With patient/family Time For Goal Achievement: 01/24/23 Potential to Achieve Goals: Fair  OT Frequency: Min 2X/week       AM-PAC OT "6 Clicks" Daily  Activity     Outcome Measure Help from another person eating meals?: A Little Help from another person taking care of personal grooming?: A Little Help from another person toileting, which includes using toliet, bedpan, or urinal?: A Lot Help from another person bathing (including washing, rinsing, drying)?: A Lot Help from another person to put on and taking off regular upper body clothing?: A Little Help from another person to put on and taking off regular lower body clothing?: A Lot 6 Click Score: 15   End of Session Equipment Utilized During Treatment: Gait belt;Rolling walker (2 wheels) Nurse Communication: Other (comment) (ok to see patient, nurse present in room at end of session.)  Activity Tolerance: Patient tolerated treatment well Patient left: in bed;with call bell/phone within reach;with family/visitor present;with nursing/sitter in room  OT Visit Diagnosis: Unsteadiness on feet (R26.81);Other abnormalities of gait and mobility (R26.89);Muscle weakness (generalized) (M62.81)                Time: 6568-1275 OT Time Calculation (min): 35 min Charges:  OT General Charges $OT Visit: 1 Visit OT Evaluation $OT Eval Moderate Complexity: 1 Mod OT Treatments $Self Care/Home Management : 8-22 mins  Rennie Plowman, MS Acute Rehabilitation Department Office# [phone]   Willa Rough 01/10/2023, 3:14 PM

## 2023-01-11 DIAGNOSIS — I10 Essential (primary) hypertension: Secondary | ICD-10-CM | POA: Diagnosis not present

## 2023-01-11 DIAGNOSIS — W19XXXA Unspecified fall, initial encounter: Secondary | ICD-10-CM | POA: Diagnosis not present

## 2023-01-11 DIAGNOSIS — G936 Cerebral edema: Secondary | ICD-10-CM | POA: Diagnosis not present

## 2023-01-11 DIAGNOSIS — F419 Anxiety disorder, unspecified: Secondary | ICD-10-CM | POA: Diagnosis not present

## 2023-01-11 LAB — CULTURE, BLOOD (ROUTINE X 2)
Culture: NO GROWTH
Culture: NO GROWTH
Special Requests: ADEQUATE

## 2023-01-11 MED ORDER — MELATONIN 3 MG PO TABS
3.0000 mg | ORAL_TABLET | Freq: Every day | ORAL | Status: DC
  Administered 2023-01-11 – 2023-01-13 (×3): 3 mg via ORAL
  Filled 2023-01-11 (×3): qty 1

## 2023-01-11 MED ORDER — AMOXICILLIN-POT CLAVULANATE 875-125 MG PO TABS
1.0000 | ORAL_TABLET | Freq: Two times a day (BID) | ORAL | Status: AC
  Administered 2023-01-11 (×2): 1 via ORAL
  Filled 2023-01-11 (×2): qty 1

## 2023-01-11 NOTE — Progress Notes (Signed)
PROGRESS NOTE    Heather [name]  WIO:[id number] DOB: 1935-06-02 DOA: 01/06/2023 PCP: [name], MD   Brief Narrative: Heather [name] is a 88 y.o. female with a history of lung cancer, anxiety, depression, anxiety, hypothyroidism. Patient presented secondary to falling and found to have evidence of likely pneumonia. Empiric antibiotics started. Brain imaging revealed a frontal lobe mass with associated vasogenic edema. Steroids started for treatment. Neurosurgery consulted with recommendation for no surgical management. Radiation oncology consulted for recommendations. Patient and family are interested in hospice, but at this time, patient is not a candidate for inpatient hospice.   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 Neurosurgery consulted with recommendation for no surgical management and consideration of radiation therapy. Patient started on decadron IV. Radiation oncology recommending outpatient follow-up to consider SBRT. -Continue Decadron PO -Outpatient palliative care follow-up   Non-small cell lung cancer Presumed diagnosis. Patient has a history of SBRT but declined chemotherapy. Patient has not followed with oncology since 2022.   Community acquired pneumonia CT evidence of possible inferior middle lobe infiltrate, with associated mild leukocytosis and fever. Patient met sepsis criteria on admission. Patient started empirically on Zosyn. -Transition to Augmentin to complete antibiotic course today   Hypokalemia Supplementation given. Resolved.   COPD Stable. -Continue Breo Ellipta   Fall Likely related to brain legion/edema. PT consulted, although patient/family interested in hospice services.    Anxiety Depression -Continue Zoloft  Insomnia Chronic issue. Patient still with some trouble sleeping -Continue home doxepin -Add melatonin  Hypothyroidism -Continue Synthroid   Primary hypertension Patient is on losartan as an outpatient, which was held on admission. -Continue home losartan   Postherpetic neuralgia Patient is on Lyrica as an outpatient, which is currently held.   DVT prophylaxis: SCDs Code Status:   Code Status: DNR Family Communication: None at bedside Disposition Plan: Discharge to SNF pending bed availability. Medically stable for discharge.   Consultants:  Neurosurgery Radiation oncology  Procedures:  None  Antimicrobials: Zosyn IV  Augmentin   Subjective: Patient with continued trouble with sleeping overnight. No other issues this morning.  Objective: BP (!) 169/71 (BP Location: Left Arm)   Pulse 62   Temp 98.6 F (37 C) (Oral)   Resp 16   Ht 4\' 11"  (1.499 m)   Wt 51.5 kg   SpO2 99%   BMI 22.92 kg/m   Examination:  General exam: Appears calm and comfortable HEENT: bilateral ptosis noted Respiratory system: Respiratory effort normal. Central nervous system: Alert and oriented to person and place. Psychiatry: Judgement and insight appear normal.    Data Reviewed: I have personally reviewed following labs and imaging studies  CBC Lab Results  Component Value Date   WBC 8.0 01/07/2023   RBC 3.56 (L) 01/07/2023   HGB 10.6 (L) 01/07/2023   HCT 33.8 (L) 01/07/2023   MCV 94.9 01/07/2023   MCH 29.8 01/07/2023   PLT 237 01/07/2023   MCHC 31.4 01/07/2023   RDW 13.6 01/07/2023   LYMPHSABS 0.7 01/07/2023   MONOABS 0.3 01/07/2023   EOSABS 0.0 01/07/2023   BASOSABS 0.0 38/45/3646     Last metabolic panel Lab Results  Component Value Date   NA 135 01/07/2023   K 3.7 01/07/2023   CL 102 01/07/2023   CO2 22 01/07/2023   BUN  11 01/07/2023   CREATININE 0.92 01/07/2023   GLUCOSE 166 (H) 01/07/2023   GFRNONAA >60  01/07/2023   GFRAA >60 07/31/2020   CALCIUM 8.5 (L) 01/07/2023   PROT 6.3 (L) 01/07/2023   ALBUMIN 3.0 (L) 01/07/2023   LABGLOB 3.2 04/10/2021   AGRATIO 1.2 04/10/2021   BILITOT 1.0 01/07/2023   ALKPHOS 51 01/07/2023   AST 24 01/07/2023   ALT 13 01/07/2023   ANIONGAP 11 01/07/2023    GFR: Estimated Creatinine Clearance: 30.5 mL/min (by C-G formula based on SCr of 0.92 mg/dL).  Recent Results (from the past 240 hour(s))  Culture, blood (Routine X 2) w Reflex to ID Panel     Status: None   Collection Time: 01/06/23  5:29 PM   Specimen: BLOOD RIGHT HAND  Result Value Ref Range Status   Specimen Description   Final    BLOOD RIGHT HAND Performed at [facility], [place] [address], [place], [place] [address]    Special Requests   Final    BOTTLES DRAWN AEROBIC AND ANAEROBIC Blood Culture results may not be optimal due to an inadequate volume of blood received in culture bottles Performed at [place] [address] [place], [place] [address]    Culture   Final    NO GROWTH 5 DAYS Performed at [facility], [place] [address] [place], [place] [address]    Report Status 01/11/2023 FINAL  Final  Culture, blood (Routine X 2) w Reflex to ID Panel     Status: None   Collection Time: 01/06/23  5:32 PM   Specimen: BLOOD  Result Value Ref Range Status   Specimen Description   Final    BLOOD RIGHT ANTECUBITAL Performed at [facility] [address] [place], [place] [address]    Special Requests   Final    BOTTLES DRAWN AEROBIC AND ANAEROBIC Blood Culture adequate volume Performed at [place] [address] [place], [place] [address]    Culture   Final    NO GROWTH 5 DAYS Performed at [facility], [place] [address], [place],  [address]    Report Status 01/11/2023 FINAL  Final      Radiology Studies: No results found.    LOS: 5 days    Cordelia Poche, MD [facility] 01/11/2023, 11:00 AM   If 7PM-7AM, please contact night-coverage [email or web address]

## 2023-01-11 NOTE — Progress Notes (Signed)
Mobility Specialist - Progress Note   01/11/23 1240  Mobility  Activity Ambulated with assistance in room  Level of Assistance Moderate assist, patient does 50-74%  Assistive Device Front wheel walker  Distance Ambulated (ft) 18 ft  Range of Motion/Exercises Active  Activity Response Tolerated well  $Mobility charge 1 Mobility   Pt was found in bed with NT in room and and agreeable to ambulation, although she is still confused. Pt was mod-A for bed mobility and sit>stand. Pt required cues for ambulation in room and placement of hands during session. Pt returned to bed and was left with NT and RN in room.  Ferd Hibbs Mobility Specialist

## 2023-01-12 ENCOUNTER — Telehealth: Payer: Self-pay

## 2023-01-12 DIAGNOSIS — I10 Essential (primary) hypertension: Secondary | ICD-10-CM | POA: Diagnosis not present

## 2023-01-12 DIAGNOSIS — F419 Anxiety disorder, unspecified: Secondary | ICD-10-CM | POA: Diagnosis not present

## 2023-01-12 DIAGNOSIS — G936 Cerebral edema: Secondary | ICD-10-CM | POA: Diagnosis not present

## 2023-01-12 DIAGNOSIS — W19XXXA Unspecified fall, initial encounter: Secondary | ICD-10-CM | POA: Diagnosis not present

## 2023-01-12 NOTE — Progress Notes (Signed)
Mobility Specialist - Progress Note   01/12/23 1325  Mobility  Activity Ambulated with assistance in room  Level of Assistance Contact guard assist, steadying assist  Assistive Device Front wheel walker  Distance Ambulated (ft) 42 ft  Range of Motion/Exercises Active  Activity Response Tolerated well  Mobility Referral Yes  $Mobility charge 1 Mobility   Pt was found in bed and agreeable to ambulate. Pt requiring cues to ambulate in room to avoid running into things (such as the table). At EOS returned to recliner chair with necessities in reach and chair alarm on. RN notified of session.   Ferd Hibbs Mobility Specialist

## 2023-01-12 NOTE — NC FL2 (Signed)
[facility] LEVEL OF CARE FORM     IDENTIFICATION  Patient Name: Heather [name] Birthdate: [birth year] Sex: female Admission Date (Current Location): 01/06/2023  [facility] and [place] Number:  Herbalist and Address:  [facility],  [place], [place]      Provider Number: [id number]  Attending Physician Name and Address:  Mariel Aloe, MD  Relative Name and Phone Number:   [name] Strickland(son)[phone])    Current Level of Care: Hospital Recommended Level of Care: [place] Prior Approval Number:    Date Approved/Denied:   PASRR Number:  [id number])  Discharge Plan: SNF    Current Diagnoses: Patient Active Problem List   Diagnosis Date Noted   Brain mass 01/06/2023   Vasogenic brain edema ([place]) 01/06/2023   Lung cancer metastatic to brain ([place]) 01/06/2023   Generalized weakness 12/04/2022   Urinary frequency 12/04/2022   Hypokalemia 12/04/2022   Right-sided chest pain 07/22/2022   Pneumonia 07/22/2022   Need for prophylactic vaccination and inoculation against varicella 07/01/2022   Encounter for general adult medical examination with abnormal findings 06/30/2022   Encounter for palliative care involving management of pain 06/30/2022   OAB (overactive bladder) 06/27/2022   Ventral hernia without obstruction or gangrene 03/25/2022   Chronic idiopathic constipation 07/01/2021   Insomnia secondary to chronic pain 06/24/2021   Primary osteoarthritis of left hip 06/24/2021   Therapeutic opioid-induced constipation (OIC) 06/24/2021   Iron deficiency anemia 04/10/2021   Goals of care, counseling/discussion 10/10/2020   PHN (postherpetic neuralgia) 05/09/2020   Fall at home, initial encounter 04/22/2019   Malignant neoplasm of lower lobe of right lung ([place]) 11/16/2018   Malnutrition of moderate degree 01/29/2018   Other emphysema ([place]) 06/26/2017   Coronary atherosclerosis 07/24/2015    Atherosclerosis of aorta ([place]) 07/24/2015   Dysphagia 07/24/2015   LOW BACK PAIN, CHRONIC 10/01/2009   Hypothyroidism 07/23/2009   Anxiety and depression 07/23/2009   Essential hypertension 07/23/2009   GERD 07/23/2009    Orientation RESPIRATION BLADDER Height & Weight     Self  Normal Incontinent Weight: 51.5 kg Height:  4\' 11"  (149.9 cm)  BEHAVIORAL SYMPTOMS/MOOD NEUROLOGICAL BOWEL NUTRITION STATUS      Incontinent Diet (Regular)  AMBULATORY STATUS COMMUNICATION OF NEEDS Skin   Extensive Assist Verbally Normal                       Personal Care Assistance Level of Assistance  Bathing, Feeding, Dressing Bathing Assistance: Limited assistance Feeding assistance: Limited assistance Dressing Assistance: Maximum assistance     Functional Limitations Info  Sight, Hearing, Speech Sight Info: Impaired Hearing Info: Adequate Speech Info: Adequate    SPECIAL CARE FACTORS FREQUENCY  PT (By licensed PT), OT (By licensed OT)     PT Frequency:  (5x week) OT Frequency:  (5x week)            Contractures Contractures Info: Not present    Additional Factors Info  Code Status, Allergies Code Status Info:  (DNR) Allergies Info:  (Tylenol)           Current Medications (01/12/2023):  This is the current hospital active medication list Current Facility-Administered Medications  Medication Dose Route Frequency Provider Last Rate Last Admin   dexamethasone (DECADRON) tablet 4 mg  4 mg Oral Q8H Hayden Pedro, PA-C   4 mg at 01/12/23 0548   docusate sodium (COLACE) capsule 100 mg  100 mg Oral  BID Quintella Baton, MD   100 mg at 01/12/23 0914   doxepin (SINEQUAN) capsule 10 mg  10 mg Oral QHS Mariel Aloe, MD   10 mg at 01/11/23 2107   enoxaparin (LOVENOX) injection 40 mg  40 mg Subcutaneous Q24H Crosley, Debby, MD   40 mg at 01/12/23 0914   fluticasone furoate-vilanterol (BREO ELLIPTA) 200-25 MCG/ACT 1 puff  1 puff Inhalation Daily Quintella Baton, MD   1 puff at  66/59/93 5701   folic acid (FOLVITE) tablet 1 mg  1 mg Oral Daily Crosley, Debby, MD   1 mg at 01/12/23 0913   guaiFENesin-dextromethorphan (ROBITUSSIN DM) 100-10 MG/5ML syrup 5 mL  5 mL Oral Q4H PRN Mariel Aloe, MD   5 mL at 01/10/23 0603   levETIRAcetam (KEPPRA) tablet 500 mg  500 mg Oral BID Quintella Baton, MD   500 mg at 01/12/23 7793   levothyroxine (SYNTHROID) tablet 25 mcg  25 mcg Oral Q0600 Quintella Baton, MD   25 mcg at 01/12/23 0548   LORazepam (ATIVAN) tablet 0.5 mg  0.5 mg Oral PRN Regan Lemming, MD       LORazepam (ATIVAN) tablet 1 mg  1 mg Oral PRN Regan Lemming, MD       losartan (COZAAR) tablet 25 mg  25 mg Oral Daily Mariel Aloe, MD   25 mg at 01/12/23 0914   melatonin tablet 3 mg  3 mg Oral QHS Mariel Aloe, MD   3 mg at 01/11/23 2108   morphine (PF) 2 MG/ML injection 1 mg  1 mg Intravenous Q4H PRN Mariel Aloe, MD       ondansetron (ZOFRAN) tablet 4 mg  4 mg Oral Q6H PRN Quintella Baton, MD       Or   ondansetron (ZOFRAN) injection 4 mg  4 mg Intravenous Q6H PRN Crosley, Debby, MD       oxyCODONE (Oxy IR/ROXICODONE) immediate release tablet 5-10 mg  5-10 mg Oral Q4H PRN Mariel Aloe, MD   10 mg at 01/12/23 0913   pantoprazole (PROTONIX) EC tablet 40 mg  40 mg Oral Daily Crosley, Debby, MD   40 mg at 01/12/23 0914   sertraline (ZOLOFT) tablet 50 mg  50 mg Oral Daily Quintella Baton, MD   50 mg at 01/12/23 9030     Discharge Medications: Please see discharge summary for a list of discharge medications.  Relevant Imaging Results:  Relevant Lab Results:   Additional Information  [phone])  Emaley Applin, Juliann Pulse, RN

## 2023-01-12 NOTE — Progress Notes (Signed)
PROGRESS NOTE    Heather [name]  AVW:[id number] DOB: 30-May-1935 DOA: 01/06/2023 PCP: [name], MD   Brief Narrative: Heather [name] is a 88 y.o. female with a history of lung cancer, anxiety, depression, anxiety, hypothyroidism. Patient presented secondary to falling and found to have evidence of likely pneumonia. Empiric antibiotics started. Brain imaging revealed a frontal lobe mass with associated vasogenic edema. Steroids started for treatment. Neurosurgery consulted with recommendation for no surgical management. Radiation oncology consulted for recommendations. Patient and family are interested in hospice, but at this time, patient is not a candidate for inpatient hospice.   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 Neurosurgery consulted with recommendation for no surgical management and consideration of radiation therapy. Patient started on decadron IV. Radiation oncology recommending outpatient follow-up to consider SBRT. -Continue Decadron PO -Outpatient palliative care follow-up   Non-small cell lung cancer Presumed diagnosis. Patient has a history of SBRT but declined chemotherapy. Patient has not followed with oncology since 2022.   Community acquired pneumonia CT evidence of possible inferior middle lobe infiltrate, with associated mild leukocytosis and fever. Patient met sepsis criteria on admission. Patient started empirically on Zosyn and transitioned to Augmentin to complete a 5 day course of antibiotics.   Hypokalemia Supplementation given. Resolved.   COPD Stable. -Continue Breo Ellipta   Fall Likely related to brain legion/edema. PT consulted, although patient/family interested in hospice services.    Anxiety Depression -Continue Zoloft  Insomnia Chronic issue. Patient was able to sleep with melatonin added -Continue home doxepin and melatonin  Hypothyroidism -Continue Synthroid   Primary hypertension Patient is on losartan as an outpatient, which was held on admission. -Continue home losartan   Postherpetic neuralgia Patient is on Lyrica as an outpatient, which is currently held.   DVT prophylaxis: SCDs Code Status:   Code Status: DNR Family Communication: None at bedside Disposition Plan: Discharge to SNF pending bed availability. Medically stable for discharge.   Consultants:  Neurosurgery Radiation oncology  Procedures:  None  Antimicrobials: Zosyn IV  Augmentin   Subjective: Patient reports sleeping last night. No other concerns.  Objective: BP (!) 162/73   Pulse 63   Temp (!) 97.5 F (36.4 C) (Oral)   Resp 16   Ht 4\' 11"  (1.499 m)   Wt 51.5 kg   SpO2 94%   BMI 22.92 kg/m   Examination:  General exam: Appears calm and comfortable HEENT: bilateral ptosis Central nervous system: Alert and oriented.  Psychiatry: Judgement and insight appear normal. Mood & affect appropriate.    Data Reviewed: I have personally reviewed following labs and imaging studies  CBC Lab Results  Component Value Date   WBC 8.0 01/07/2023   RBC 3.56 (L) 01/07/2023   HGB 10.6 (L) 01/07/2023   HCT 33.8 (L) 01/07/2023   MCV 94.9 01/07/2023   MCH 29.8 01/07/2023   PLT 237 01/07/2023   MCHC 31.4 01/07/2023   RDW 13.6 01/07/2023   LYMPHSABS 0.7 01/07/2023   MONOABS 0.3 01/07/2023   EOSABS 0.0 01/07/2023   BASOSABS 0.0 53/74/8270     Last metabolic panel Lab Results  Component Value Date   NA 135 01/07/2023   K 3.7 01/07/2023   CL 102 01/07/2023   CO2 22 01/07/2023   BUN 11 01/07/2023   CREATININE 0.92  01/07/2023   GLUCOSE 166 (H) 01/07/2023   GFRNONAA >60 01/07/2023   GFRAA >60 07/31/2020   CALCIUM 8.5 (L) 01/07/2023   PROT 6.3 (L) 01/07/2023    ALBUMIN 3.0 (L) 01/07/2023   LABGLOB 3.2 04/10/2021   AGRATIO 1.2 04/10/2021   BILITOT 1.0 01/07/2023   ALKPHOS 51 01/07/2023   AST 24 01/07/2023   ALT 13 01/07/2023   ANIONGAP 11 01/07/2023    GFR: Estimated Creatinine Clearance: 30.5 mL/min (by C-G formula based on SCr of 0.92 mg/dL).  Recent Results (from the past 240 hour(s))  Culture, blood (Routine X 2) w Reflex to ID Panel     Status: None   Collection Time: 01/06/23  5:29 PM   Specimen: BLOOD RIGHT HAND  Result Value Ref Range Status   Specimen Description   Final    BLOOD RIGHT HAND Performed at [facility], [place] [address], [place], [place] [address]    Special Requests   Final    BOTTLES DRAWN AEROBIC AND ANAEROBIC Blood Culture results may not be optimal due to an inadequate volume of blood received in culture bottles Performed at [facility] [address], [place], [place] [address]    Culture   Final    NO GROWTH 5 DAYS Performed at [facility], [place] [address], [place], [place] [address]    Report Status 01/11/2023 FINAL  Final  Culture, blood (Routine X 2) w Reflex to ID Panel     Status: None   Collection Time: 01/06/23  5:32 PM   Specimen: BLOOD  Result Value Ref Range Status   Specimen Description   Final    BLOOD RIGHT ANTECUBITAL Performed at [facility] [address], [place], [place] [address]    Special Requests   Final    BOTTLES DRAWN AEROBIC AND ANAEROBIC Blood Culture adequate volume Performed at [facility] [address], [place], [place] [address]    Culture   Final    NO GROWTH 5 DAYS Performed at [facility], [place] [address], [place],  [address]    Report Status 01/11/2023 FINAL  Final      Radiology Studies: No results found.    LOS: 6 days    Cordelia Poche, MD [facility] 01/12/2023, 12:30 PM   If 7PM-7AM, please contact night-coverage [email or web address]

## 2023-01-12 NOTE — Progress Notes (Signed)
[facility] Liaison Note  Notified by TOC/Kathy of patient/family request of [facility] Paliative services.  [facility] hospital liaison will follow patient for discharge disposition.   Please call with any questions/concerns.    Thank you for the opportunity to participate in this patient's care.   Phillis Haggis, MSW [facility] Liaison  [phone]

## 2023-01-12 NOTE — Patient Outreach (Signed)
  Care Coordination   Follow Up Visit Note   01/12/2023 Name: Heather [name] MRN: [id number] DOB: 1935-03-18  Heather [name] is a 88 y.o. year old female who sees Heather Lima, MD for primary care. I spoke with patient's son Heather [name] by phone today.  What matters to the patients health and wellness today?  RNCM returned call to patient's son(dpr) who had called with questions about level of care concerns. He reports patient is in the hospital and that hospital case manager is working on patient's care coordination needs.    Goals Addressed             This Visit's Progress    Care Coordination Activities       Care Coordination Interventions: Discussed process for level of care concerns with patient's son. Patient Hospitalized and son states he is working with CM in the hospital re: discharge needs.        SDOH assessments and interventions completed:  No  Care Coordination Interventions:  Yes, provided   Follow up plan:  RNCM will continue to follow    Encounter Outcome:  Pt. Visit Completed   Heather Silversmith, RN, MSN, BSN, [place] Coordinator [phone]

## 2023-01-13 ENCOUNTER — Telehealth: Payer: Self-pay | Admitting: Radiation Oncology

## 2023-01-13 DIAGNOSIS — F419 Anxiety disorder, unspecified: Secondary | ICD-10-CM | POA: Diagnosis not present

## 2023-01-13 DIAGNOSIS — G936 Cerebral edema: Secondary | ICD-10-CM | POA: Diagnosis not present

## 2023-01-13 DIAGNOSIS — W19XXXA Unspecified fall, initial encounter: Secondary | ICD-10-CM | POA: Diagnosis not present

## 2023-01-13 DIAGNOSIS — I10 Essential (primary) hypertension: Secondary | ICD-10-CM | POA: Diagnosis not present

## 2023-01-13 LAB — CREATININE, SERUM
Creatinine, Ser: 0.81 mg/dL (ref 0.44–1.00)
GFR, Estimated: >60 mL/min (ref >60)

## 2023-01-13 NOTE — Telephone Encounter (Signed)
I called and spoke with the patient's son to let him know we were following along to see his mother's discharge plans. We will plan to see her in the outpatient setting once she's discharged to a SNF.

## 2023-01-13 NOTE — Progress Notes (Signed)
Physical Therapy Treatment Patient Details Name: Heather [name] MRN: [id number] DOB: [birth year] Today's Date: 01/13/2023   History of Present Illness Patient is 88 yo female admitted after fall at home on 01/06/23.  Pt found to have CAP as well as vasogenic brain edema likely metastatic brain lesion (frontal lobe mass) from non-small cell lung CA. Pt with hx including but not limited to lung CA, anxiety, depression, hypothyroidism.    PT Comments    Progressing with mobility. Pt continues to require repeated multimodal cueing for safe mobility. She is easily distracted by the environment-can be redirected. Encouraged pt to sit up for as long as she could (in the recliner). Continue to recommend SNF.     Recommendations for follow up therapy are one component of a multi-disciplinary discharge planning process, led by the attending physician.  Recommendations may be updated based on patient status, additional functional criteria and insurance authorization.  Follow Up Recommendations  Skilled nursing-short term rehab (<3 hours/day) Can patient physically be transported by private vehicle: Yes   Assistance Recommended at Discharge Frequent or constant Supervision/Assistance  Patient can return home with the following A lot of help with bathing/dressing/bathroom;Assistance with cooking/housework;A little help with walking and/or transfers   Equipment Recommendations  None recommended by PT    Recommendations for Other Services       Precautions / Restrictions Precautions Precautions: Fall Restrictions Weight Bearing Restrictions: No     Mobility  Bed Mobility Overal bed mobility: Needs Assistance Bed Mobility: Supine to Sit     Supine to sit: HOB elevated, Min assist     General bed mobility comments: Increased time and repeated cues. Cues for technique.    Transfers Overall transfer level: Needs assistance Equipment used: Rolling walker (2 wheels) Transfers: Sit  to/from Stand Sit to Stand: Min assist           General transfer comment: Assist to power up, stabilize, control descent. Cues for safety, technique, hand placement. Pt did not follow command for proper hand placement despite cueing-she prefers to pull up on walker. Increased time.    Ambulation/Gait Ambulation/Gait assistance: Min assist Gait Distance (Feet): 50 Feet Assistive device: Rolling walker (2 wheels) Gait Pattern/deviations: Step-through pattern, Decreased step length - right, Decreased step length - left, Decreased stride length       General Gait Details: Min A to steady pt and to manage RW throughout distance. Cues for safety, RW proximity, increased step length bilaterally.   Stairs             Wheelchair Mobility    Modified Rankin (Stroke Patients Only)       Balance Overall balance assessment: Needs assistance, History of Falls         Standing balance support: Reliant on assistive device for balance, Bilateral upper extremity supported, During functional activity Standing balance-Leahy Scale: Poor                              Cognition Arousal/Alertness: Awake/alert Behavior During Therapy: Flat affect Overall Cognitive Status: Impaired/Different from baseline Area of Impairment: Orientation, Problem solving, Following commands, Safety/judgement, Awareness, Attention, Memory                 Orientation Level: Disoriented to, Time, Situation   Memory: Decreased short-term memory Following Commands: Follows one step commands inconsistently     Problem Solving: Slow processing, Requires verbal cues, Requires tactile cues General Comments: 1 moment of irritation  when encouraged to sit up in the recliner. Requires repeated multimodal cueing. Easily distracted.        Exercises General Exercises - Lower Extremity Ankle Circles/Pumps: AROM, Both, Seated, 10 reps Long Arc Quad: AROM, Both, 10 reps, Seated Hip  Flexion/Marching: AAROM, Both, 5 reps, Seated (pt had difficulty with this one)    General Comments        Pertinent Vitals/Pain Pain Assessment Pain Assessment: Faces Faces Pain Scale: Hurts little more Pain Location: back Pain Descriptors / Indicators: Discomfort, Grimacing Pain Intervention(s): Limited activity within patient's tolerance, Monitored during session, Repositioned    Home Living                          Prior Function            PT Goals (current goals can now be found in the care plan section) Progress towards PT goals: Progressing toward goals    Frequency    Min 2X/week      PT Plan Current plan remains appropriate    Co-evaluation              AM-PAC PT "6 Clicks" Mobility   Outcome Measure  Help needed turning from your back to your side while in a flat bed without using bedrails?: A Little Help needed moving from lying on your back to sitting on the side of a flat bed without using bedrails?: A Little Help needed moving to and from a bed to a chair (including a wheelchair)?: A Little Help needed standing up from a chair using your arms (e.g., wheelchair or bedside chair)?: A Little Help needed to walk in hospital room?: A Lot Help needed climbing 3-5 steps with a railing? : Total 6 Click Score: 15    End of Session Equipment Utilized During Treatment: Gait belt Activity Tolerance: Patient limited by fatigue Patient left: in chair;with call bell/phone within reach;with chair alarm set;with family/visitor present   PT Visit Diagnosis: Other abnormalities of gait and mobility (R26.89);Muscle weakness (generalized) (M62.81)     Time: 2263-3354 PT Time Calculation (min) (ACUTE ONLY): 19 min  Charges:  $Gait Training: 8-22 mins                         Doreatha Massed, PT Acute Rehabilitation  Office: [phone]

## 2023-01-13 NOTE — Progress Notes (Signed)
Occupational Therapy Treatment Patient Details Name: Heather [name] MRN: [id number] DOB: January 04, 1935 Today's Date: 01/13/2023   History of present illness Patient is 88 yo female admitted after fall at home on 01/06/23.  Pt found to have CAP as well as vasogenic brain edema likely metastatic brain lesion (frontal lobe mass) from non-small cell lung CA. Pt with hx including but not limited to lung CA, anxiety, depression, hypothyroidism.   OT comments  The pt was seen for functional strengthening, increasing out of bed activity, and functional transfers needed to prep her for progressive ADL participation. In general, she required intermittent assist for problem solving, as well as cues for short-term memory/recall. She appeared to have a fear of falling, as she was hesitant to perform progressive activity and she was noted to state "this is dangerous for me because I recently had a fall."; as such, she required some reassurance in this regard. She further reported having increased back pain. She required min assist for supine to sit & for sit to stand using a RW. She will continue to benefit from further OT services to maximize her ADL performance.    Recommendations for follow up therapy are one component of a multi-disciplinary discharge planning process, led by the attending physician.  Recommendations may be updated based on patient status, additional functional criteria and insurance authorization.    Follow Up Recommendations  Skilled nursing-short term rehab (<3 hours/day)     Assistance Recommended at Discharge Frequent or constant Supervision/Assistance  Patient can return home with the following  A little help with walking and/or transfers;A lot of help with bathing/dressing/bathroom;Assistance with cooking/housework;Direct supervision/assist for medications management;Assist for transportation;Help with stairs or ramp for entrance;Direct supervision/assist for financial management    Equipment Recommendations  None recommended by OT       Precautions / Restrictions Precautions Precautions: Fall Restrictions Weight Bearing Restrictions: No       Mobility Bed Mobility Overal bed mobility: Needs Assistance Bed Mobility: Supine to Sit, Sit to Supine     Supine to sit: HOB elevated, Min assist Sit to supine: Min assist   General bed mobility comments: Required slightly increased time and min verbal cues for initiation and sequencing, in order to perform supine to sit    Transfers Overall transfer level: Needs assistance Equipment used: Rolling walker (2 wheels) Transfers: Sit to/from Stand Sit to Stand: Min assist           General transfer comment: Required cues to push up from transfer surface         ADL either performed or assessed with clinical judgement   ADL Overall ADL's : Needs assistance/impaired Eating/Feeding: Supervision/ safety;Sitting Eating/Feeding Details (indicate cue type and reason): based on clinical judgement Grooming: Minimal assistance;Sitting Grooming Details (indicate cue type and reason): based on clinical judgement         Upper Body Dressing : Minimal assistance   Lower Body Dressing: Maximal assistance                       Cognition Arousal/Alertness: Awake/alert Behavior During Therapy: Flat affect   Area of Impairment: Orientation, Memory, Problem solving                 Orientation Level: Time, Situation, Disoriented to   Memory: Decreased short-term memory Following Commands: Follows one step commands consistently     Problem Solving: Slow processing, Requires verbal cues  Pertinent Vitals/ Pain       Pain Assessment Pain Assessment: 0-10 Pain Score: 8  Pain Location: back Pain Intervention(s): Repositioned, Heat applied, Patient requesting pain meds-RN notified         Frequency  Min 2X/week        Progress Toward Goals  OT  Goals(current goals can now be found in the care plan section)     Acute Rehab OT Goals Patient Stated Goal: not specifically stated this date OT Goal Formulation: With patient Time For Goal Achievement: 01/24/23 Potential to Achieve Goals: [place] Discharge plan remains appropriate       AM-PAC OT "6 Clicks" Daily Activity     Outcome Measure   Help from another person eating meals?: A Little Help from another person taking care of personal grooming?: A Little Help from another person toileting, which includes using toliet, bedpan, or urinal?: A Lot Help from another person bathing (including washing, rinsing, drying)?: A Lot Help from another person to put on and taking off regular upper body clothing?: A Little Help from another person to put on and taking off regular lower body clothing?: A Lot 6 Click Score: 15    End of Session Equipment Utilized During Treatment: Gait belt;Rolling walker (2 wheels)  OT Visit Diagnosis: Unsteadiness on feet (R26.81);Muscle weakness (generalized) (M62.81)   Activity Tolerance Patient tolerated treatment well   Patient Left in bed;with call bell/phone within reach;with bed alarm set   Nurse Communication Mobility status        Time: 4503-8882 OT Time Calculation (min): 19 min  Charges: OT General Charges $OT Visit: 1 Visit OT Treatments $Therapeutic Activity: 8-22 mins     Leota Sauers, OTR/L 01/13/2023, 5:29 PM

## 2023-01-13 NOTE — TOC Progression Note (Signed)
Transition of Care [facility]) - Progression Note   Patient Details  Name: Heather [name] MRN: [id number] Date of Birth: Dec 11, 1934  Transition of Care [facility]) CM/SW [place], LCSW Phone Number: 01/13/2023, 2:33 PM  Clinical Narrative: CSW provided patient's son, Heather [name], with bed offers and explained patient is medically ready for discharge to SNF pending bed choice and insurance approval. Son stated, "I can't choose one today. It'll be tomorrow." Son asked about patient after rehab. CSW explained that Medicare only covers rehab and not LTC. CSW updated hospitalist. CSW started insurance authorization on [place] portal. Heather [name] ID # is: [id number]. TOC awaiting insurance approval and for son to choose a bed.  Expected Discharge Plan: Skilled Nursing Facility Barriers to Discharge: Insurance Authorization, Other (must enter comment) (Son needs to choose SNF bed from offers.)  Expected Discharge Plan and Services              DME Arranged: N/A DME Agency: NA  Social Determinants of Health (SDOH) Interventions SDOH Screenings   Food Insecurity: No Food Insecurity (01/07/2023)  Housing: Low Risk  (01/07/2023)  Transportation Needs: No Transportation Needs (01/07/2023)  Utilities: Not At Risk (01/07/2023)  Alcohol Screen: Low Risk  (06/25/2022)  Depression (PHQ2-9): Low Risk  (12/30/2022)  Financial Resource Strain: Low Risk  (06/25/2022)  Physical Activity: Inactive (06/25/2022)  Social Connections: Socially Isolated (06/25/2022)  Stress: No Stress Concern Present (06/25/2022)  Tobacco Use: Medium Risk (01/07/2023)   Readmission Risk Interventions    01/07/2023   10:59 AM  Readmission Risk Prevention Plan  Transportation Screening Complete  PCP or Specialist Appt within 3-5 Days Complete  HRI or [place] Complete  Social Work Consult for [place] Planning/Counseling Complete  Palliative Care Screening Complete  Medication Review [employer])  Complete

## 2023-01-13 NOTE — Plan of Care (Signed)
  Problem: Pain Managment: Goal: General experience of comfort will improve Outcome: Progressing   Problem: Safety: Goal: Ability to remain free from injury will improve Outcome: Progressing   Problem: Skin Integrity: Goal: Risk for impaired skin integrity will decrease Outcome: Progressing   

## 2023-01-13 NOTE — Progress Notes (Addendum)
PROGRESS NOTE    Heather [name]  ENI:[id number] DOB: 1935/06/04 DOA: 01/06/2023 PCP: [name], MD   Brief Narrative: Heather [name] is a 88 y.o. female with a history of lung cancer, anxiety, depression, anxiety, hypothyroidism. Patient presented secondary to falling and found to have evidence of likely pneumonia. Empiric antibiotics started. Brain imaging revealed a frontal lobe mass with associated vasogenic edema. Steroids started for treatment. Neurosurgery consulted with recommendation for no surgical management. Radiation oncology consulted for recommendations. Patient and family are interested in hospice, but at this time, patient is not a candidate for inpatient hospice.   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 Neurosurgery consulted with recommendation for no surgical management and consideration of radiation therapy. Patient started on decadron IV. Initial plan for hospice, however after discussion with radiation oncology, plan is to pursue SBRT as they are optimistic she will benefit from treatment, potentially adding a significant amount of time to her life. Patient, son and radiation oncology to discuss further as an outpatient.  -Continue Decadron PO -Outpatient palliative care follow-up -Outpatient radiation oncology follow-up   Non-small cell lung cancer Presumed diagnosis. Patient has a history of SBRT but declined chemotherapy. Patient has not followed with oncology since 2022.   Community acquired pneumonia CT evidence of possible inferior middle lobe infiltrate, with associated mild leukocytosis and fever. Patient met sepsis criteria on admission. Patient started empirically on Zosyn and transitioned to Augmentin to  complete a 5 day course of antibiotics.   Hypokalemia Supplementation given. Resolved.   COPD Stable. -Continue Breo Ellipta   Fall Likely related to brain legion/edema. PT consulted, although patient/family interested in hospice services.   Anxiety Depression -Continue Zoloft  Insomnia Chronic issue. Patient was able to sleep with melatonin added -Continue home doxepin and melatonin  Hypothyroidism -Continue Synthroid   Primary hypertension Patient is on losartan as an outpatient, which was held on admission. -Continue home losartan   Postherpetic neuralgia Patient is on Lyrica as an outpatient, which is currently held.   DVT prophylaxis: SCDs Code Status:   Code Status: DNR Family Communication: None at bedside Disposition Plan: Discharge to SNF pending bed availability. Medically stable for discharge since 2/2.   Consultants:  Neurosurgery Radiation oncology  Procedures:  None  Antimicrobials: Zosyn IV  Augmentin   Subjective: No issues overnight. Patient reports having a concern, but could not think of it when I went to see her.   Objective: BP (!) 133/51 (BP Location: Right Arm)   Pulse 64   Temp 98.2 F (36.8 C) (Oral)   Resp 18   Ht 4\' 11"  (1.499 m)   Wt 51.5 kg   SpO2 96%   BMI 22.92 kg/m   Examination:  General exam: Appears calm and comfortable Respiratory system: Clear to auscultation. Respiratory effort normal. Cardiovascular system: S1 & S2 heard, RRR. No murmurs. Gastrointestinal system: Abdomen is nondistended, soft and nontender. Normal bowel sounds heard. Central nervous system: Alert and oriented.    Data Reviewed: I have personally reviewed following labs and imaging studies  CBC Lab Results  Component Value Date   WBC 8.0 01/07/2023   RBC 3.56 (L) 01/07/2023   HGB 10.6 (L) 01/07/2023   HCT 33.8 (L) 01/07/2023   MCV 94.9 01/07/2023   MCH 29.8 01/07/2023  PLT 237 01/07/2023   MCHC 31.4 01/07/2023   RDW 13.6  01/07/2023   LYMPHSABS 0.7 01/07/2023   MONOABS 0.3 01/07/2023   EOSABS 0.0 01/07/2023   BASOSABS 0.0 08/67/6195     Last metabolic panel Lab Results  Component Value Date   NA 135 01/07/2023   K 3.7 01/07/2023   CL 102 01/07/2023   CO2 22 01/07/2023   BUN 11 01/07/2023   CREATININE 0.81 01/13/2023   GLUCOSE 166 (H) 01/07/2023   GFRNONAA >60 01/13/2023   GFRAA >60 07/31/2020   CALCIUM 8.5 (L) 01/07/2023   PROT 6.3 (L) 01/07/2023   ALBUMIN 3.0 (L) 01/07/2023   LABGLOB 3.2 04/10/2021   AGRATIO 1.2 04/10/2021   BILITOT 1.0 01/07/2023   ALKPHOS 51 01/07/2023   AST 24 01/07/2023   ALT 13 01/07/2023   ANIONGAP 11 01/07/2023    GFR: Estimated Creatinine Clearance: 34.6 mL/min (by C-G formula based on SCr of 0.81 mg/dL).  Recent Results (from the past 240 hour(s))  Culture, blood (Routine X 2) w Reflex to ID Panel     Status: None   Collection Time: 01/06/23  5:29 PM   Specimen: BLOOD RIGHT HAND  Result Value Ref Range Status   Specimen Description   Final    BLOOD RIGHT HAND Performed at [facility], [place] [address], [place], [place] [address]    Special Requests   Final    BOTTLES DRAWN AEROBIC AND ANAEROBIC Blood Culture results may not be optimal due to an inadequate volume of blood received in culture bottles Performed at [facility] [address] [place], [place] [address]    Culture   Final    NO GROWTH 5 DAYS Performed at [facility], [place] [address] [place], [place] [address]    Report Status 01/11/2023 FINAL  Final  Culture, blood (Routine X 2) w Reflex to ID Panel     Status: None   Collection Time: 01/06/23  5:32 PM   Specimen: BLOOD  Result Value Ref Range Status   Specimen Description   Final    BLOOD RIGHT ANTECUBITAL Performed at [facility] [address], [place], [place] [address]    Special Requests   Final    BOTTLES DRAWN AEROBIC AND ANAEROBIC Blood Culture adequate  volume Performed at [facility] [address], [place], [place] [address]    Culture   Final    NO GROWTH 5 DAYS Performed at [facility], [place] [address], [place], [place] [address]    Report Status 01/11/2023 FINAL  Final      Radiology Studies: No results found.    LOS: 7 days    Cordelia Poche, MD [facility] 01/13/2023, 1:24 PM   If 7PM-7AM, please contact night-coverage [email or web address]

## 2023-01-13 NOTE — Plan of Care (Signed)
  Problem: Clinical Measurements: Goal: Ability to maintain clinical measurements within normal limits will improve Outcome: Progressing Goal: Will remain free from infection Outcome: Progressing Goal: Diagnostic test results will improve Outcome: Progressing   Problem: Activity: Goal: Risk for activity intolerance will decrease Outcome: Progressing   Problem: Coping: Goal: Level of anxiety will decrease Outcome: Progressing   Problem: Safety: Goal: Ability to remain free from injury will improve Outcome: Progressing

## 2023-01-13 NOTE — Progress Notes (Signed)
Received report from off going RN. Agree with previous RN assessment. Pt resting comfortably in bed, no new needs at this time.

## 2023-01-14 DIAGNOSIS — B372 Candidiasis of skin and nail: Secondary | ICD-10-CM | POA: Diagnosis not present

## 2023-01-14 DIAGNOSIS — C3490 Malignant neoplasm of unspecified part of unspecified bronchus or lung: Secondary | ICD-10-CM | POA: Diagnosis not present

## 2023-01-14 DIAGNOSIS — E039 Hypothyroidism, unspecified: Secondary | ICD-10-CM | POA: Diagnosis not present

## 2023-01-14 DIAGNOSIS — W19XXXA Unspecified fall, initial encounter: Secondary | ICD-10-CM | POA: Diagnosis not present

## 2023-01-14 DIAGNOSIS — F419 Anxiety disorder, unspecified: Secondary | ICD-10-CM | POA: Diagnosis not present

## 2023-01-14 DIAGNOSIS — R531 Weakness: Secondary | ICD-10-CM | POA: Diagnosis not present

## 2023-01-14 DIAGNOSIS — C7931 Secondary malignant neoplasm of brain: Secondary | ICD-10-CM | POA: Diagnosis not present

## 2023-01-14 DIAGNOSIS — C3431 Malignant neoplasm of lower lobe, right bronchus or lung: Secondary | ICD-10-CM | POA: Diagnosis not present

## 2023-01-14 DIAGNOSIS — K219 Gastro-esophageal reflux disease without esophagitis: Secondary | ICD-10-CM | POA: Diagnosis not present

## 2023-01-14 DIAGNOSIS — G9389 Other specified disorders of brain: Secondary | ICD-10-CM | POA: Diagnosis not present

## 2023-01-14 DIAGNOSIS — K5900 Constipation, unspecified: Secondary | ICD-10-CM | POA: Diagnosis not present

## 2023-01-14 DIAGNOSIS — Z515 Encounter for palliative care: Secondary | ICD-10-CM | POA: Diagnosis not present

## 2023-01-14 DIAGNOSIS — E440 Moderate protein-calorie malnutrition: Secondary | ICD-10-CM | POA: Diagnosis not present

## 2023-01-14 DIAGNOSIS — E559 Vitamin D deficiency, unspecified: Secondary | ICD-10-CM | POA: Diagnosis not present

## 2023-01-14 DIAGNOSIS — G4700 Insomnia, unspecified: Secondary | ICD-10-CM | POA: Diagnosis not present

## 2023-01-14 DIAGNOSIS — E119 Type 2 diabetes mellitus without complications: Secondary | ICD-10-CM | POA: Diagnosis not present

## 2023-01-14 DIAGNOSIS — N3281 Overactive bladder: Secondary | ICD-10-CM | POA: Diagnosis not present

## 2023-01-14 DIAGNOSIS — D509 Iron deficiency anemia, unspecified: Secondary | ICD-10-CM | POA: Diagnosis not present

## 2023-01-14 DIAGNOSIS — I10 Essential (primary) hypertension: Secondary | ICD-10-CM | POA: Diagnosis not present

## 2023-01-14 DIAGNOSIS — G8929 Other chronic pain: Secondary | ICD-10-CM | POA: Diagnosis not present

## 2023-01-14 DIAGNOSIS — Z7401 Bed confinement status: Secondary | ICD-10-CM | POA: Diagnosis not present

## 2023-01-14 DIAGNOSIS — F32A Depression, unspecified: Secondary | ICD-10-CM | POA: Diagnosis not present

## 2023-01-14 DIAGNOSIS — B0229 Other postherpetic nervous system involvement: Secondary | ICD-10-CM | POA: Diagnosis not present

## 2023-01-14 DIAGNOSIS — M6281 Muscle weakness (generalized): Secondary | ICD-10-CM | POA: Diagnosis not present

## 2023-01-14 DIAGNOSIS — R410 Disorientation, unspecified: Secondary | ICD-10-CM | POA: Diagnosis not present

## 2023-01-14 DIAGNOSIS — E876 Hypokalemia: Secondary | ICD-10-CM | POA: Diagnosis not present

## 2023-01-14 DIAGNOSIS — G936 Cerebral edema: Secondary | ICD-10-CM | POA: Diagnosis not present

## 2023-01-14 DIAGNOSIS — Y92009 Unspecified place in unspecified non-institutional (private) residence as the place of occurrence of the external cause: Secondary | ICD-10-CM | POA: Diagnosis not present

## 2023-01-14 DIAGNOSIS — J449 Chronic obstructive pulmonary disease, unspecified: Secondary | ICD-10-CM | POA: Diagnosis not present

## 2023-01-14 MED ORDER — OXYCODONE HCL 10 MG PO TABS: 10.0000 mg | ORAL_TABLET | Freq: Three times a day (TID) | ORAL | 0 refills | Status: AC | PRN

## 2023-01-14 MED ORDER — ONDANSETRON HCL 4 MG PO TABS: 4.0000 mg | ORAL_TABLET | Freq: Four times a day (QID) | ORAL | 0 refills | Status: DC | PRN

## 2023-01-14 MED ORDER — MELATONIN 3 MG PO TABS: 3.0000 mg | ORAL_TABLET | Freq: Every day | ORAL | 0 refills | Status: DC

## 2023-01-14 MED ORDER — PROMETHAZINE HCL 25 MG PO TABS: 25.0000 mg | ORAL_TABLET | Freq: Three times a day (TID) | ORAL | Status: DC | PRN

## 2023-01-14 MED ORDER — DEXAMETHASONE 4 MG PO TABS: 4.0000 mg | ORAL_TABLET | Freq: Three times a day (TID) | ORAL | Status: DC

## 2023-01-14 MED ORDER — LEVETIRACETAM 500 MG PO TABS: 500.0000 mg | ORAL_TABLET | Freq: Two times a day (BID) | ORAL | Status: DC

## 2023-01-14 MED ORDER — ORAL CARE MOUTH RINSE: 15.0000 mL | OROMUCOSAL | Status: DC | PRN

## 2023-01-14 NOTE — Progress Notes (Signed)
Report called and given to nurse at [facility] @ 1145 a.m. AVS placed in packet ready to be picked-up by [facility]. [facility] scheduled for pick-up at 1p.m.

## 2023-01-14 NOTE — Plan of Care (Signed)

## 2023-01-14 NOTE — Discharge Summary (Signed)
Physician Discharge Summary  Heather [name] JTT:[id number] DOB: 1935/11/27 DOA: 01/06/2023  PCP: [name], MD  Admit date: 01/06/2023 Discharge date: 01/14/2023  Admitted From: Home  Discharge disposition: Skilled nursing facility with palliative care follow-up.  Recommendations for Outpatient Follow-Up:   Follow up with your primary care provider at the skilled nursing facility in 3 to 5 days. Check CBC, BMP, magnesium in the next visit Follow-up with radiation oncology  as outpatient.  Discharge Diagnosis:   Principal Problem:   Vasogenic brain edema (HCC) Active Problems:   Anxiety and depression   Essential hypertension   Malignant neoplasm of lower lobe of right lung ([place])   Fall at home, initial encounter   PHN (postherpetic neuralgia)   Pneumonia   Hypokalemia   Brain mass   Lung cancer metastatic to brain [facility])   Discharge Condition: Improved.  Diet recommendation: Regular.  Wound care: None.  Code status: DNR   History of Present Illness:   Patient is a 88 year old female with history of lung cancer, anxiety , depression, hypertension and postherpetic neuralgia presented hospital after falling twice with generalized body pain.  In the ED, patient had low-grade fever at 100.4 F. Labs showed mild leukocytosis with elevated CK at 493.  Urine drug screen was positive for benzodiazepines.  Lactate was 1.3.  Alcohol level was less than 10.  CT head scan showed brain mass with vasogenic edema and 2 mm midline shift.  ED provider spoke with neurosurgery on-call and patient was considered for admission to the hospital for further evaluation and treatment.  No surgical recommendation has been made and radiation oncology was consulted.  Patient and family are interested in hospice but at this time patient is not a candidate for inpatient hospice.   Hospital Course:   Following conditions were addressed during hospitalization as listed below,  Vasogenic  brain edema from brain metastasis MRI of the brain was significant for solid mass in the left frontal region concerning for possible metastatic disease with significant edema with mild mass effect.  History of lung cancer.  Patient has been on Decadron.  This will be continued on discharge until seen by radiation oncology as outpatient.  Neurosurgery also saw the patient during hospitalization and recommended radiation treatment.  Currently plan is to proceed with SBRT as they are optimistic regarding the treatment.  Patient's son, patient and radiation oncology to further discuss as outpatient.  Plan is also palliative care follow-up as outpatient.  Will continue PPI on discharge.   Non-small cell lung cancer. Patient does have a history of SBRT but declined chemotherapy.  Has not followed up with oncology since 2022.   Community-acquired pneumonia CT scan of the chest showed increased opacity over the inferior and middle lobe.  WBC mildly elevated at 11.7 with temperature 100.4 F on presentation.  Patient was initially on Zosyn which was transitioned to Augmentin and has completed the course.  Has improved at this time.   Hypokalemia Improved after replacement.  Recommend labs in few days and replenish as necessary.   COPD Continue Breo on discharge.  Appears compensated.   Fall x 2. Likely secondary to brain lesion.  PT was consulted and current plan is skilled nursing facility with outpatient radiation.   Anxiety and depression Continue Zoloft    Essential hypertension Continue Cozaar.   PHN (postherpetic neuralgia) Lyrica on hold.  Reconsider as necessary as outpatient.  Disposition.  At this time, patient is stable for disposition to skilled nursing facility  with outpatient radiation oncology follow-up.  Patient would benefit from palliative care follow-up as outpatient  Medical Consultants:   Neurosurgery Radiation oncology  Procedures:    None Subjective:   Today,  patient was seen and examined at bedside.  Patient complains of generalized weakness but denies any headache, nausea, vomiting, fever, chills or rigor.  Denies any shortness of breath or cough.  Discharge Exam:   Vitals:   01/14/23 0500 01/14/23 0758  BP: (!) 168/69   Pulse: 67   Resp: 20   Temp: 98.2 F (36.8 C)   SpO2: 98% 94%   Vitals:   01/13/23 2052 01/14/23 0000 01/14/23 0500 01/14/23 0758  BP: (!) 138/56  (!) 168/69   Pulse: 68  67   Resp: 16  20   Temp: 98.2 F (36.8 C)  98.2 F (36.8 C)   TempSrc: Oral  Oral   SpO2: 94%  98% 94%  Weight:  50.6 kg    Height:        General: Alert awake, not in obvious distress, appears chronically ill weak and deconditioned. HENT: pupils equally reacting to light, mild pallor noted.. Oral mucosa is moist.  Chest:  Clear breath sounds.  Diminished breath sounds bilaterally. No crackles or wheezes.  CVS: S1 &S2 heard. No murmur.  Regular rate and rhythm. Abdomen: Soft, nontender, nondistended.  Bowel sounds are heard.   Extremities: No cyanosis, clubbing or edema.  Peripheral pulses are palpable. Psych: Alert, awake and oriented, normal mood CNS:  No cranial nerve deficits.  Moves all extremities but has generalized weakness noted Skin: Warm and dry.  No rashes noted.  The results of significant diagnostics from this hospitalization (including imaging, microbiology, ancillary and laboratory) are listed below for reference.     Diagnostic Studies:   MR Brain W and Wo Contrast  Result Date: 01/07/2023 CLINICAL DATA:  Brain mass EXAM: MRI HEAD WITHOUT AND WITH CONTRAST TECHNIQUE: Multiplanar, multiecho pulse sequences of the brain and surrounding structures were obtained without and with intravenous contrast. CONTRAST:  64mL GADAVIST GADOBUTROL 1 MMOL/ML IV SOLN COMPARISON:  CT head 1 day prior FINDINGS: Brain: Again seen is a solid mass in the left frontal region measuring 3.0 cm AP x 2.8 cm TV by 3.4 cm cc. There is heterogeneous  enhancement with central non enhancement. Foci of SWI signal dropout may reflect blood or mineralization. There is patchy diffusion restriction within the mass. The mass appears extra-axial particularly on the T2 sequences (12-23, 19-16). No dural tail is seen. There is significant edema in the underlying frontal lobe parenchyma extending to the external capsule. Mass effect results in partial effacement of the left lateral ventricle but no midline shift. There is no evidence of acute infarct. Background parenchymal volume is normal for age. The ventricles are otherwise normal in size. Patchy FLAIR signal abnormality in the remainder of the supratentorial white matter likely reflects underlying chronic small-vessel ischemic change. The pituitary and suprasellar region are normal. There is no other abnormal enhancement. Vascular: Normal flow voids. Skull and upper cervical spine: Normal marrow signal. Sinuses/Orbits: The paranasal sinuses are clear. The globes and orbits are unremarkable. Other: None. IMPRESSION: Solid mass in the left frontal region appears extra-axial. The mass is indeterminate but could reflect metastatic disease given the history of lung cancer. Significant edema in the underlying frontal parenchyma with mild mass effect but no midline shift. Electronically Signed   By: Valetta Mole M.D.   On: 01/07/2023 15:00   CT CHEST ABDOMEN PELVIS  W CONTRAST  Result Date: 01/06/2023 CLINICAL DATA:  Patient found down.  Non-small-cell lung cancer EXAM: CT CHEST, ABDOMEN, AND PELVIS WITH CONTRAST TECHNIQUE: Multidetector CT imaging of the chest, abdomen and pelvis was performed following the standard protocol during bolus administration of intravenous contrast. RADIATION DOSE REDUCTION: This exam was performed according to the departmental dose-optimization program which includes automated exposure control, adjustment of the mA and/or kV according to patient size and/or use of iterative reconstruction  technique. CONTRAST:  163mL OMNIPAQUE IOHEXOL 300 MG/ML  SOLN COMPARISON:  CT chest 12/16/2021 and older. Abdomen and pelvis study 03/27/2021. FINDINGS: CT CHEST FINDINGS Cardiovascular: Heart is nonenlarged. No significant pericardial effusion. Scattered coronary artery calcifications are identified. There is also some calcifications towards the mitral valve annulus. The thoracic aorta has some partially calcified atherosclerotic plaque without dissection or aneurysm formation. Plaque extends along the origins of the great vessels. Slight ectasia of the distal descending thoracic aorta on axial image 38, nonspecific. There is some pulsation artifact along the ascending aorta but no mediastinal hematoma. Mediastinum/Nodes: Thyroid gland is small. No specific abnormal lymph node enlargement identified in the axillary regions, left hilum or mediastinum. There are some small nodes along the supraclavicular region on the left such as series 3, image 8 measuring 10 by 7 mm, unchanged. Nodular fullness along the right hilum is again seen with some small nodes. This includes series 3, image 35 measuring 17 by 12 mm. This is unchanged in retrospect from previous. The small node more superior at 6 mm in short axis is not well seen today and may relate to volume averaging. Lungs/Pleura: Centrilobular emphysematous lung changes. No pleural effusion or pneumothorax. Left lung has some basilar scarring and atelectatic areas. The masslike opacity extending from the right hilum into the right lower lobe is stable when taking into account technical differences. There is some increasing atelectasis or opacity along the inferior middle lobe with some reticular and nodular changes. Recommend short follow-up. Musculoskeletal: Mild degenerative changes seen along the spine. Once again there is augmentation cement along the L2 compression vertebral body. CT ABDOMEN PELVIS FINDINGS Hepatobiliary: No space-occupying liver lesion.  Gallbladder is mildly distended. Patent portal vein. Pancreas: Mild pancreatic atrophy. Spleen: No splenic enlargement or mass.  Small lateral splenule Adrenals/Urinary Tract: Adrenal glands are preserved. Mild renal atrophy. Bosniak 1 left-sided renal cyst anteriorly in the midportion is stable. No specific imaging follow-up. No collecting system dilatation. The uterus has a normal course and caliber down to the bladder. Preserved contours of the urinary bladder. Stomach/Bowel: On this non oral contrast exam, the large bowel has a normal course and caliber with scattered colonic stool. Diffuse scattered colonic diverticula are identified. Appendix is not seen. No pericecal stranding or fluid. The stomach is relatively collapsed. Small bowel is nondilated. Vascular/Lymphatic: Normal caliber aorta and IVC with scattered atherosclerotic changes. Retroaortic left renal vein. Areas of stenosis along the iliac vessels to the atherosclerotic changes. Reproductive: Status post hysterectomy. No adnexal masses. Other: No ascites.  Small fat containing umbilical hernia. Musculoskeletal: Curvature and degenerative changes along the spine. Of note there is streak artifact as the the left arm was scanned across the abdomen. IMPRESSION: Relatively stable appearance to the right hilar lymph node enlargement and soft tissue mass extending into the right lower lobe consistent with known history of lung cancer. Increasing opacity along the inferior middle lobe. Recommend follow-up. No pneumothorax or effusion. No bowel obstruction, free air or free fluid. No evidence of solid organ injury. Colonic diverticulosis.  Stable compression of L2 with augmentation cement. Please see separate dictation of numerous other CT examinations from same day Electronically Signed   By: Jill Side M.D.   On: 01/06/2023 19:17   CT T-SPINE NO CHARGE  Result Date: 01/06/2023 CLINICAL DATA:  Found down EXAM: CT Thoracic and Lumbar spine without  contrast TECHNIQUE: Multiplanar CT images of the thoracic and lumbar spine were reconstructed from contemporary CT of the Chest, Abdomen, and Pelvis. RADIATION DOSE REDUCTION: This exam was performed according to the departmental dose-optimization program which includes automated exposure control, adjustment of the mA and/or kV according to patient size and/or use of iterative reconstruction technique. CONTRAST:  No additional contrast COMPARISON:  None Available. FINDINGS: CT THORACIC SPINE FINDINGS Alignment: Normal. Vertebrae: No acute fracture or focal pathologic process. Disc levels: No spinal canal stenosis CT LUMBAR SPINE FINDINGS Segmentation: 5 lumbar type vertebrae. Alignment: Normal Vertebrae: Chronic L2 compression deformity with post augmentation changes. No acute fracture. No lytic or blastic osseous lesion. Disc levels: No spinal canal stenosis. IMPRESSION: 1. No acute fracture or static subluxation of the thoracic or lumbar spine. 2. Chronic L2 compression deformity with post augmentation changes. Electronically Signed   By: Ulyses Jarred M.D.   On: 01/06/2023 19:12   CT L-SPINE NO CHARGE  Result Date: 01/06/2023 CLINICAL DATA:  Found down EXAM: CT Thoracic and Lumbar spine without contrast TECHNIQUE: Multiplanar CT images of the thoracic and lumbar spine were reconstructed from contemporary CT of the Chest, Abdomen, and Pelvis. RADIATION DOSE REDUCTION: This exam was performed according to the departmental dose-optimization program which includes automated exposure control, adjustment of the mA and/or kV according to patient size and/or use of iterative reconstruction technique. CONTRAST:  No additional contrast COMPARISON:  None Available. FINDINGS: CT THORACIC SPINE FINDINGS Alignment: Normal. Vertebrae: No acute fracture or focal pathologic process. Disc levels: No spinal canal stenosis CT LUMBAR SPINE FINDINGS Segmentation: 5 lumbar type vertebrae. Alignment: Normal Vertebrae: Chronic L2  compression deformity with post augmentation changes. No acute fracture. No lytic or blastic osseous lesion. Disc levels: No spinal canal stenosis. IMPRESSION: 1. No acute fracture or static subluxation of the thoracic or lumbar spine. 2. Chronic L2 compression deformity with post augmentation changes. Electronically Signed   By: Ulyses Jarred M.D.   On: 01/06/2023 19:12   CT HEAD WO CONTRAST  Result Date: 01/06/2023 CLINICAL DATA:  Found down EXAM: CT HEAD WITHOUT CONTRAST CT CERVICAL SPINE WITHOUT CONTRAST TECHNIQUE: Multidetector CT imaging of the head and cervical spine was performed following the standard protocol without intravenous contrast. Multiplanar CT image reconstructions of the cervical spine were also generated. RADIATION DOSE REDUCTION: This exam was performed according to the departmental dose-optimization program which includes automated exposure control, adjustment of the mA and/or kV according to patient size and/or use of iterative reconstruction technique. COMPARISON:  None Available. FINDINGS: CT HEAD FINDINGS Brain: There is an intraparenchymal mass in the left frontal lobe that measures 3.0 x 3.3 cm. There is moderate surrounding vasogenic edema with rightward midline shift of approximately 2 mm. No hydrocephalus. No acute hemorrhage or extra-axial collection. Vascular: Atherosclerotic calcification of the internal carotid arteries at the skull base. No abnormal hyperdensity of the major intracranial arteries or dural venous sinuses. Skull: The visualized skull base, calvarium and extracranial soft tissues are normal. Sinuses/Orbits: No fluid levels or advanced mucosal thickening of the visualized paranasal sinuses. No mastoid or middle ear effusion. The orbits are normal. CT CERVICAL SPINE FINDINGS Alignment: No static subluxation. Facets are  aligned. Occipital condyles are normally positioned. Skull base and vertebrae: No acute fracture. Soft tissues and spinal canal: No prevertebral  fluid or swelling. No visible canal hematoma. Disc levels: Multilevel degenerative disc disease without spinal canal stenosis. Upper chest: No pneumothorax, pulmonary nodule or pleural effusion. Other: Normal visualized paraspinal cervical soft tissues. IMPRESSION: 1. Intraparenchymal mass in the left frontal lobe with moderate surrounding vasogenic edema and 2 mm rightward midline shift. MRI with and without contrast is recommended for further characterization. 2. No acute fracture or static subluxation of the cervical spine. Electronically Signed   By: Ulyses Jarred M.D.   On: 01/06/2023 19:08   CT CERVICAL SPINE WO CONTRAST  Result Date: 01/06/2023 CLINICAL DATA:  Found down EXAM: CT HEAD WITHOUT CONTRAST CT CERVICAL SPINE WITHOUT CONTRAST TECHNIQUE: Multidetector CT imaging of the head and cervical spine was performed following the standard protocol without intravenous contrast. Multiplanar CT image reconstructions of the cervical spine were also generated. RADIATION DOSE REDUCTION: This exam was performed according to the departmental dose-optimization program which includes automated exposure control, adjustment of the mA and/or kV according to patient size and/or use of iterative reconstruction technique. COMPARISON:  None Available. FINDINGS: CT HEAD FINDINGS Brain: There is an intraparenchymal mass in the left frontal lobe that measures 3.0 x 3.3 cm. There is moderate surrounding vasogenic edema with rightward midline shift of approximately 2 mm. No hydrocephalus. No acute hemorrhage or extra-axial collection. Vascular: Atherosclerotic calcification of the internal carotid arteries at the skull base. No abnormal hyperdensity of the major intracranial arteries or dural venous sinuses. Skull: The visualized skull base, calvarium and extracranial soft tissues are normal. Sinuses/Orbits: No fluid levels or advanced mucosal thickening of the visualized paranasal sinuses. No mastoid or middle ear effusion.  The orbits are normal. CT CERVICAL SPINE FINDINGS Alignment: No static subluxation. Facets are aligned. Occipital condyles are normally positioned. Skull base and vertebrae: No acute fracture. Soft tissues and spinal canal: No prevertebral fluid or swelling. No visible canal hematoma. Disc levels: Multilevel degenerative disc disease without spinal canal stenosis. Upper chest: No pneumothorax, pulmonary nodule or pleural effusion. Other: Normal visualized paraspinal cervical soft tissues. IMPRESSION: 1. Intraparenchymal mass in the left frontal lobe with moderate surrounding vasogenic edema and 2 mm rightward midline shift. MRI with and without contrast is recommended for further characterization. 2. No acute fracture or static subluxation of the cervical spine. Electronically Signed   By: Ulyses Jarred M.D.   On: 01/06/2023 19:08   DG Chest Port 1 View  Result Date: 01/06/2023 CLINICAL DATA:  Altered mental status. EXAM: PORTABLE CHEST 1 VIEW COMPARISON:  November 26, 2022 FINDINGS: The study is limited secondary to patient rotation. The heart size and mediastinal contours are within normal limits. A small amount of curvilinear radiopaque material is again seen overlying the right hilum. Mild linear atelectasis is noted within the bilateral lung bases. There is no evidence of an acute infiltrate, pleural effusion or pneumothorax. No acute osseous abnormalities identified. IMPRESSION: Mild bibasilar linear atelectasis. Electronically Signed   By: Virgina Norfolk M.D.   On: 01/06/2023 17:31     Labs:   Basic Metabolic Panel: Recent Labs  Lab 01/13/23 0434  CREATININE 0.81   GFR Estimated Creatinine Clearance: 34.6 mL/min (by C-G formula based on SCr of 0.81 mg/dL). Liver Function Tests: No results for input(s): "AST", "ALT", "ALKPHOS", "BILITOT", "PROT", "ALBUMIN" in the last 168 hours. No results for input(s): "LIPASE", "AMYLASE" in the last 168 hours. No results for input(s): "AMMONIA"  in the  last 168 hours. Coagulation profile No results for input(s): "INR", "PROTIME" in the last 168 hours.  CBC: No results for input(s): "WBC", "NEUTROABS", "HGB", "HCT", "MCV", "PLT" in the last 168 hours. Cardiac Enzymes: No results for input(s): "CKTOTAL", "CKMB", "CKMBINDEX", "TROPONINI" in the last 168 hours. BNP: Invalid input(s): "POCBNP" CBG: Recent Labs  Lab 01/07/23 2029 01/08/23 0716  GLUCAP 180* 130*   D-Dimer No results for input(s): "DDIMER" in the last 72 hours. Hgb A1c No results for input(s): "HGBA1C" in the last 72 hours. Lipid Profile No results for input(s): "CHOL", "HDL", "LDLCALC", "TRIG", "CHOLHDL", "LDLDIRECT" in the last 72 hours. Thyroid function studies No results for input(s): "TSH", "T4TOTAL", "T3FREE", "THYROIDAB" in the last 72 hours.  Invalid input(s): "FREET3" Anemia work up No results for input(s): "VITAMINB12", "FOLATE", "FERRITIN", "TIBC", "IRON", "RETICCTPCT" in the last 72 hours. Microbiology Recent Results (from the past 240 hour(s))  Culture, blood (Routine X 2) w Reflex to ID Panel     Status: None   Collection Time: 01/06/23  5:29 PM   Specimen: BLOOD RIGHT HAND  Result Value Ref Range Status   Specimen Description   Final    BLOOD RIGHT HAND Performed at [facility], [place] [address], [place], [place] [address]    Special Requests   Final    BOTTLES DRAWN AEROBIC AND ANAEROBIC Blood Culture results may not be optimal due to an inadequate volume of blood received in culture bottles Performed at [place] [address] [place], [place] [address]    Culture   Final    NO GROWTH 5 DAYS Performed at [facility], [place] [address] [place], [place] [address]    Report Status 01/11/2023 FINAL  Final  Culture, blood (Routine X 2) w Reflex to ID Panel     Status: None   Collection Time: 01/06/23  5:32 PM   Specimen: BLOOD  Result Value Ref Range Status   Specimen Description   Final     BLOOD RIGHT ANTECUBITAL Performed at [place] [address] [place], [place] [address]    Special Requests   Final    BOTTLES DRAWN AEROBIC AND ANAEROBIC Blood Culture adequate volume Performed at [place] [address] [place], [place] [address]    Culture   Final    NO GROWTH 5 DAYS Performed at [facility], [place] [address], [place], [place] [address]    Report Status 01/11/2023 FINAL  Final     Discharge Instructions:   Discharge Instructions     Diet general   Complete by: As directed    Discharge instructions   Complete by: As directed    Follow-up with your primary care provider at the skilled nursing facility in 3 to 5 days.  Follow-up with radiation oncology as outpatient to discuss about radiation..  Take medications as prescribed.   Increase activity slowly   Complete by: As directed       Allergies as of 01/14/2023       Reactions   Tylenol [acetaminophen] Swelling, Other (See Comments)   Nightmares, tongue swelling         Medication List     STOP taking these medications    cephALEXin 500 MG capsule Commonly known as: KEFLEX   clorazepate 7.5 MG tablet Commonly known as: TRANXENE   potassium chloride SA 20 MEQ tablet Commonly known as: KLOR-CON M   pregabalin 50 MG capsule  Commonly known as: Lyrica       TAKE these medications    Breo Ellipta 200-25 MCG/ACT Aepb Generic drug: fluticasone furoate-vilanterol Inhale 1 puff into the lungs daily.   dexamethasone 4 MG tablet Commonly known as: DECADRON Take 1 tablet (4 mg total) by mouth every 8 (eight) hours. Until seen by radiation oncology   docusate sodium 250 MG capsule Commonly known as: COLACE Take 250 mg by mouth daily.   doxepin 10 MG capsule Commonly known as: SINEQUAN TAKE ONE CAPSULE BY MOUTH AT BEDTIME AS NEEDED What changed: when to take this   esomeprazole 40 MG capsule Commonly known as: NEXIUM TAKE ONE CAPSULE  BY MOUTH DAILY   folic acid 1 MG tablet Commonly known as: FOLVITE TAKE ONE TABLET BY MOUTH DAILY   levETIRAcetam 500 MG tablet Commonly known as: KEPPRA Take 1 tablet (500 mg total) by mouth 2 (two) times daily.   levothyroxine 25 MCG tablet Commonly known as: SYNTHROID TAKE ONE TABLET BY MOUTH DAILY BEFORE BREAKFAST What changed: See the new instructions.   losartan 25 MG tablet Commonly known as: COZAAR TAKE ONE TABLET BY MOUTH DAILY HIGH BLOOD PRESSURE What changed: See the new instructions.   melatonin 3 MG Tabs tablet Take 1 tablet (3 mg total) by mouth at bedtime.   Mucinex DM 30-600 MG Tb12 Take 1 tablet by mouth 2 (two) times daily. What changed:  when to take this reasons to take this   ondansetron 4 MG tablet Commonly known as: ZOFRAN Take 1 tablet (4 mg total) by mouth every 6 (six) hours as needed for nausea.   oxybutynin 5 MG 24 hr tablet Commonly known as: DITROPAN-XL TAKE ONE TABLET BY MOUTH IN THE MORNING FOR BLADDER CONTROL What changed: See the new instructions.   Oxycodone HCl 10 MG Tabs Take 1 tablet (10 mg total) by mouth every 8 (eight) hours as needed. What changed: reasons to take this   polyvinyl alcohol 1.4 % ophthalmic solution Commonly known as: LIQUIFILM TEARS Place 1 drop into both eyes as needed for dry eyes.   ProAir RespiClick 124 (90 Base) MCG/ACT Aepb Generic drug: Albuterol Sulfate Inhale 2 puffs into the lungs 4 (four) times daily as needed. What changed: reasons to take this   promethazine 25 MG tablet Commonly known as: PHENERGAN Take 1 tablet (25 mg total) by mouth every 8 (eight) hours as needed for refractory nausea / vomiting. What changed: See the new instructions.   Relistor 150 MG Tabs Generic drug: Methylnaltrexone Bromide TAKE THREE TABLETS BY MOUTH DAILY What changed: how much to take   sertraline 50 MG tablet Commonly known as: ZOLOFT TAKE ONE TABLET BY MOUTH EVERY DAY What changed: how to take this    Trulance 3 MG Tabs Generic drug: Plecanatide Take 1 tablet by mouth daily. What changed: how much to take   vitamin B-12 50 MCG tablet Commonly known as: CYANOCOBALAMIN Take 50 mcg by mouth daily.   Vitamin D-1000 Max St 25 MCG (1000 UT) tablet Generic drug: Cholecalciferol Take 1,000 Units by mouth daily.        Contact information for after-discharge care     [facility] .   Service: Skilled Nursing Contact information: [place] [phone]                      Time coordinating discharge: 39 minutes  Signed:  Corrie Mckusick Elnore Cosens  [facility] 01/14/2023, 10:41 AM

## 2023-01-14 NOTE — Care Management Important Message (Signed)
Important Message  Patient Details IM Letter given. Name: Heather [name] MRN: [id number] Date of Birth: October 22, 1935   Medicare Important Message Given:  Yes     Kerin Salen 01/14/2023, 11:21 AM

## 2023-01-14 NOTE — TOC Transition Note (Addendum)
Transition of Care [facility]) - CM/SW Discharge Note   Patient Details  Name: Heather [name] MRN: [id number] Date of Birth: 05/30/1935  [facility] of [facility]) CM/SW Contact:  Henrietta Dine, RN Phone Number: 01/14/2023, 11:09 AM   Clinical Narrative:    D/C orders received; given Narda Rutherford at facility pt given RM # [id number], call report # [phone]; SNF transfer report and d/c summary sent via hub; pt's son notified; PTAR called at 1112; spoke w/ Ron; Eustaquio Maize at [facility] notified of pt d/c; [facility] notified of bed choice by Megan, LCSW; no TOC needed.   Final next level of care: Skilled Nursing Facility Barriers to Discharge: No Barriers Identified   Patient Goals and CMS Choice      Discharge Placement                Patient chooses bed at: [facility] Patient to be transferred to facility by: [place] Name of family member notified: [name] (son) [phone] Patient and family notified of of transfer: 01/14/23  Discharge Plan and Services Additional resources added to the After Visit Summary for                  DME Arranged: N/A DME Agency: NA                  Social Determinants of Health ([place]) Interventions SDOH Screenings   Food Insecurity: No Food Insecurity (01/07/2023)  Housing: Low Risk  (01/07/2023)  Transportation Needs: No Transportation Needs (01/07/2023)  Utilities: Not At Risk (01/07/2023)  Alcohol Screen: Low Risk  (06/25/2022)  Depression (PHQ2-9): Low Risk  (12/30/2022)  Financial Resource Strain: Low Risk  (06/25/2022)  Physical Activity: Inactive (06/25/2022)  Social Connections: Socially Isolated (06/25/2022)  Stress: No Stress Concern Present (06/25/2022)  Tobacco Use: Medium Risk (01/07/2023)     Readmission Risk Interventions    01/07/2023   10:59 AM  Readmission Risk Prevention Plan  Transportation Screening Complete  PCP or Specialist Appt within 3-5 Days Complete  HRI or [place] Complete  Social Work  Consult for [place] Planning/Counseling Complete  Palliative Care Screening Complete  Medication Review Press photographer) Complete

## 2023-01-14 NOTE — TOC Progression Note (Addendum)
Transition of Care [facility]) - Progression Note    Patient Details  Name: Heather [name] MRN: [id number] Date of Birth: 06/24/1935  [facility]) CM/SW Contact  Henrietta Dine, RN Phone Number: 01/14/2023, 9:08 AM  Clinical Narrative:     Attempted to contact pt's son for bed choice; LVM at [phone]; awaiting return call.  -[id number]- contacted pt's son; he selected [facility]; ins auth approved 01/14/2023 -02/09/202;Janie at facility notified; she says she will call back to discuss; awaiting return call.  -0939- call back from [place] at facility; she will contact pt's son; she will call back w/ RM# and call report #.  Expected Discharge Plan: Skilled Nursing Facility Barriers to Discharge: Insurance Authorization, Other (must enter comment) (Son needs to choose SNF bed from offers.)  Expected Discharge Plan and Services                         DME Arranged: N/A DME Agency: NA                   Social Determinants of Health (SDOH) Interventions SDOH Screenings   Food Insecurity: No Food Insecurity (01/07/2023)  Housing: Low Risk  (01/07/2023)  Transportation Needs: No Transportation Needs (01/07/2023)  Utilities: Not At Risk (01/07/2023)  Alcohol Screen: Low Risk  (06/25/2022)  Depression (PHQ2-9): Low Risk  (12/30/2022)  Financial Resource Strain: Low Risk  (06/25/2022)  Physical Activity: Inactive (06/25/2022)  Social Connections: Socially Isolated (06/25/2022)  Stress: No Stress Concern Present (06/25/2022)  Tobacco Use: Medium Risk (01/07/2023)    Readmission Risk Interventions    01/07/2023   10:59 AM  Readmission Risk Prevention Plan  Transportation Screening Complete  PCP or Specialist Appt within 3-5 Days Complete  HRI or [place] Complete  Social Work Consult for [place] Planning/Counseling Complete  Palliative Care Screening Complete  Medication Review Press photographer) Complete

## 2023-01-14 NOTE — Progress Notes (Signed)
PTAR arrived to pick up patient for D/C. Patient's son has been notified.

## 2023-01-15 DIAGNOSIS — K219 Gastro-esophageal reflux disease without esophagitis: Secondary | ICD-10-CM | POA: Diagnosis not present

## 2023-01-15 DIAGNOSIS — C7931 Secondary malignant neoplasm of brain: Secondary | ICD-10-CM | POA: Diagnosis not present

## 2023-01-15 DIAGNOSIS — G4700 Insomnia, unspecified: Secondary | ICD-10-CM | POA: Diagnosis not present

## 2023-01-15 DIAGNOSIS — J449 Chronic obstructive pulmonary disease, unspecified: Secondary | ICD-10-CM | POA: Diagnosis not present

## 2023-01-15 DIAGNOSIS — E039 Hypothyroidism, unspecified: Secondary | ICD-10-CM | POA: Diagnosis not present

## 2023-01-15 DIAGNOSIS — G936 Cerebral edema: Secondary | ICD-10-CM | POA: Diagnosis not present

## 2023-01-15 DIAGNOSIS — N3281 Overactive bladder: Secondary | ICD-10-CM | POA: Diagnosis not present

## 2023-01-15 DIAGNOSIS — C3431 Malignant neoplasm of lower lobe, right bronchus or lung: Secondary | ICD-10-CM | POA: Diagnosis not present

## 2023-01-15 DIAGNOSIS — I10 Essential (primary) hypertension: Secondary | ICD-10-CM | POA: Diagnosis not present

## 2023-01-15 DIAGNOSIS — D509 Iron deficiency anemia, unspecified: Secondary | ICD-10-CM | POA: Diagnosis not present

## 2023-01-15 DIAGNOSIS — K5900 Constipation, unspecified: Secondary | ICD-10-CM | POA: Diagnosis not present

## 2023-01-16 DIAGNOSIS — J449 Chronic obstructive pulmonary disease, unspecified: Secondary | ICD-10-CM | POA: Diagnosis not present

## 2023-01-16 DIAGNOSIS — I10 Essential (primary) hypertension: Secondary | ICD-10-CM | POA: Diagnosis not present

## 2023-01-16 DIAGNOSIS — C3490 Malignant neoplasm of unspecified part of unspecified bronchus or lung: Secondary | ICD-10-CM | POA: Diagnosis not present

## 2023-01-16 DIAGNOSIS — C7931 Secondary malignant neoplasm of brain: Secondary | ICD-10-CM | POA: Diagnosis not present

## 2023-01-19 ENCOUNTER — Telehealth: Payer: Self-pay | Admitting: Radiation Oncology

## 2023-01-19 NOTE — Telephone Encounter (Signed)
Asked by Bryson Ha to schedule consult for patient. Spoke to pt's son who was adamant he did not want to schedule consult for patient at this time. Son stated this was due to not knowing how permanent pt's stay @ SNF would be. Will f/u 2/23 per son's request.

## 2023-01-20 DIAGNOSIS — E039 Hypothyroidism, unspecified: Secondary | ICD-10-CM | POA: Diagnosis not present

## 2023-01-20 DIAGNOSIS — D509 Iron deficiency anemia, unspecified: Secondary | ICD-10-CM | POA: Diagnosis not present

## 2023-01-20 DIAGNOSIS — J449 Chronic obstructive pulmonary disease, unspecified: Secondary | ICD-10-CM | POA: Diagnosis not present

## 2023-01-20 DIAGNOSIS — C7931 Secondary malignant neoplasm of brain: Secondary | ICD-10-CM | POA: Diagnosis not present

## 2023-01-20 DIAGNOSIS — G4700 Insomnia, unspecified: Secondary | ICD-10-CM | POA: Diagnosis not present

## 2023-01-20 DIAGNOSIS — I10 Essential (primary) hypertension: Secondary | ICD-10-CM | POA: Diagnosis not present

## 2023-01-22 DIAGNOSIS — E876 Hypokalemia: Secondary | ICD-10-CM | POA: Diagnosis not present

## 2023-01-22 DIAGNOSIS — I10 Essential (primary) hypertension: Secondary | ICD-10-CM | POA: Diagnosis not present

## 2023-01-22 DIAGNOSIS — C7931 Secondary malignant neoplasm of brain: Secondary | ICD-10-CM | POA: Diagnosis not present

## 2023-01-22 DIAGNOSIS — J449 Chronic obstructive pulmonary disease, unspecified: Secondary | ICD-10-CM | POA: Diagnosis not present

## 2023-01-23 ENCOUNTER — Other Ambulatory Visit: Payer: Self-pay | Admitting: *Deleted

## 2023-01-23 DIAGNOSIS — I10 Essential (primary) hypertension: Secondary | ICD-10-CM | POA: Diagnosis not present

## 2023-01-23 NOTE — Patient Outreach (Signed)
Per [facility] Mrs. Cheuvront resides in [facility]. Screening for potential [facility] care coordination services as benefit of health plan and PCP.  Active with [facility] care coordination team prior to admission.   Attempted to reach [facility] social worker by phone to collaborate. No answer. Voicemail left. Secure communication sent to Florentina Jenny, [employer] social worker, to inquire about transition plans/needs.   Will continue to follow. Will update [facility] RN care coordinator as appropriate.   [name], MSN, RN,BSN [place] Acute Care Coordinator [phone] (Direct dial)

## 2023-01-26 DIAGNOSIS — J449 Chronic obstructive pulmonary disease, unspecified: Secondary | ICD-10-CM | POA: Diagnosis not present

## 2023-01-26 DIAGNOSIS — C7931 Secondary malignant neoplasm of brain: Secondary | ICD-10-CM | POA: Diagnosis not present

## 2023-01-26 DIAGNOSIS — K219 Gastro-esophageal reflux disease without esophagitis: Secondary | ICD-10-CM | POA: Diagnosis not present

## 2023-01-26 DIAGNOSIS — I10 Essential (primary) hypertension: Secondary | ICD-10-CM | POA: Diagnosis not present

## 2023-01-30 ENCOUNTER — Telehealth: Payer: Self-pay | Admitting: Radiation Oncology

## 2023-01-30 ENCOUNTER — Non-Acute Institutional Stay: Payer: Medicare Other | Admitting: Family Medicine

## 2023-01-30 ENCOUNTER — Encounter: Payer: Self-pay | Admitting: Physician Assistant

## 2023-01-30 ENCOUNTER — Encounter: Payer: Self-pay | Admitting: Family Medicine

## 2023-01-30 VITALS — BP 112/52 | HR 71 | Temp 97.9°F | Resp 18 | Wt 103.3 lb

## 2023-01-30 DIAGNOSIS — E440 Moderate protein-calorie malnutrition: Secondary | ICD-10-CM

## 2023-01-30 DIAGNOSIS — Z515 Encounter for palliative care: Secondary | ICD-10-CM | POA: Diagnosis not present

## 2023-01-30 DIAGNOSIS — C3490 Malignant neoplasm of unspecified part of unspecified bronchus or lung: Secondary | ICD-10-CM

## 2023-01-30 DIAGNOSIS — R531 Weakness: Secondary | ICD-10-CM

## 2023-01-30 DIAGNOSIS — Z9181 History of falling: Secondary | ICD-10-CM

## 2023-01-30 DIAGNOSIS — G8929 Other chronic pain: Secondary | ICD-10-CM

## 2023-01-30 DIAGNOSIS — G936 Cerebral edema: Secondary | ICD-10-CM

## 2023-01-30 DIAGNOSIS — C7931 Secondary malignant neoplasm of brain: Secondary | ICD-10-CM | POA: Diagnosis not present

## 2023-01-30 DIAGNOSIS — C3431 Malignant neoplasm of lower lobe, right bronchus or lung: Secondary | ICD-10-CM | POA: Diagnosis not present

## 2023-01-30 DIAGNOSIS — M545 Low back pain, unspecified: Secondary | ICD-10-CM

## 2023-01-30 DIAGNOSIS — I10 Essential (primary) hypertension: Secondary | ICD-10-CM | POA: Diagnosis not present

## 2023-01-30 DIAGNOSIS — J449 Chronic obstructive pulmonary disease, unspecified: Secondary | ICD-10-CM | POA: Diagnosis not present

## 2023-01-30 NOTE — Telephone Encounter (Signed)
Spoke to pt's son who stated he does not want consult and asked for referral to be closed at this time. Son was advised to call office if he changes his mind about this consult.

## 2023-01-30 NOTE — Progress Notes (Unsigned)
[facility] Pall[facility] Care Consult Note Telephone: [phone]  Fax: [phone]   Date of encounter: 01/30/23 9:48 AM PATIENT NAME: Heather [name] [id number] [place] [address]   [phone] (home)  DOB: 10/30/1935 MRN: [id number] PRIMARY CARE PROVIDER:    [name], MD,  [place] [address] [phone]  REFERRING PROVIDER:   [name], MD [address] [place],  [place] [address] [phone]  Health Care Power of Attorney:   Landry Dyke [id number] Contact Information     Name Relation Home Work Mobile   [place] Son [phone]          I met face to face with patient in [facility]. Palliative Care was asked to follow this patient by consultation request of [name], MD to address advance care planning and complex medical decision making. Referring provider is Dr Seward Carol. This is an initial visit.  ADVANCE CARE PLANNING/GOALS OF CARE: Health care surrogate gave his permission to discuss. Maximize comfort and quality of life.  PRESENT FOR DISCUSSION: GOALS: Comfort Care with Hospice following in LTC  BARRIERS: Currently on skilled days and has applied for Medicaid.     The value and importance of advance care planning-Son [name] is [facility] POA and paperwork on file with [facility].  He has opted not to do radiation therapy and to seek comfort care for patient. Experiences with loved ones who have been seriously ill or have died-son had a family member who went through Hospice with [facility] and was very satisfied with care.  He wanted to use [facility] but was told when patient inpatient that [facility] was closed for remodelling. He said that she was seen by [place] in the hospital but states that they have not done anything currently for patient.  Advised that [facility] has reopened and  since pt on skilled days he should talk with the business office about when pt will be done with skilled days and express if he wants patient to be on Hospice what is involved in that here in the facility. Advised it is his decision if he wants to stay with [facility] or go with [facility].  Advised if he wants to change that he would need to talk with her case manager at [facility] to have her d/c'd from service there and if desired since we have a referral from the facility attending provider we could then open her to Palliative. He plans for her to stay in LTC and has applied for Medicaid Exploration of personal, cultural or spiritual beliefs that might influence medical decisions  Review of advance directive documents-DNR and MOS.   CODE Status: DNR as of 04/25/2019  MOST as of 01/14/23: DNR/DNI with limited additional intervention Use of antibiotics and IV fluids if indicated Feeding tube was not addressed  I spent 35:07  minutes providing this consultation with more than 50% of the time spent on counseling patient's [facility] POA and coordinating communication with referring provider, staff, chart review and documentation. _____________________________  ASSESSMENT AND / RECOMMENDATIONS:      PPS: 30%  Follow up Palliative Care Visit:  Palliative Care continuing to follow up by monitoring for changes in appetite, weight, functional and cognitive status for chronic disease progression and management in agreement with patient's stated goals of care. Next visit in 2-3 weeks or prn.  This visit was coded based on medical decision making (MDM).  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  HISTORY OF PRESENT ILLNESS: Heather [name] is an 88 y.o. year old female with lung Cancer metastatic to brain with recent fall and discovery of vasogenic edema. Pain in her back, rating 8 on 10 scale and she cannot identify if  pain meds help or if movement worsens pain.  Does not answer about nausea/vomiting.  Does not answer about constipation.  States her son [name] is her health care agent.  She says she is having back pain and when asked if she has it at rest or with movement, she replies "I don't know how to answer that."  She rated her pain currently 8/10 scale and when asked if pain meds helped decrease her pain she replied "I don't know how to answer that."  Facility staff denies falls and states her Oxycodone was recently increased to Q 6hrs from Q 8 hrs.  She takes pain medicine whole but other meds are crushed for her and she is eating regular diet with no s/sx of dysphagia per staff.  She did not answer some questions and just stared blankly ahead. She expressed some irritability stating "I'm just tired" but did give permission for assessment. Pt is on relistor daily for opiod induced constipation. Plan on d/c from the hospital recommended by neurosurgery was for radiation treatment with SBRT and patient will follow up with radiation oncology. She is continuing on Decadron. Family expressed interest in Hospice but at that time and with radiation she is not currently a candidate for hospice. Son has been ill recently and is on bedrest presently.   ACTIVITIES OF DAILY LIVING: CONTINENT OF BLADDER? No CONTINENT OF BOWEL? No  MOBILITY:   INDEPENDENTLY AMBULATORY?  No  AMBULATORY WITH ASSISTIVE DEVICE: Rolling walker with standby assist   APPETITE? Good  CURRENT PROBLEM LIST:  Patient Active Problem List   Diagnosis Date Noted   Brain mass 01/06/2023   Vasogenic brain edema ([place]) 01/06/2023   Lung cancer metastatic to brain ([place]) 01/06/2023   Generalized weakness 12/04/2022   Urinary frequency 12/04/2022   Hypokalemia 12/04/2022   Right-sided chest pain 07/22/2022   Pneumonia 07/22/2022   Need for prophylactic vaccination and inoculation against varicella 07/01/2022   Encounter for general adult medical  examination with abnormal findings 06/30/2022   Encounter for palliative care involving management of pain 06/30/2022   OAB (overactive bladder) 06/27/2022   Ventral hernia without obstruction or gangrene 03/25/2022   Chronic idiopathic constipation 07/01/2021   Insomnia secondary to chronic pain 06/24/2021   Primary osteoarthritis of left hip 06/24/2021   Therapeutic opioid-induced constipation (OIC) 06/24/2021   Iron deficiency anemia 04/10/2021   Goals of care, counseling/discussion 10/10/2020   PHN (postherpetic neuralgia) 05/09/2020   Fall at home, initial encounter 04/22/2019   Malignant neoplasm of lower lobe of right lung ([place]) 11/16/2018   Malnutrition of moderate degree 01/29/2018   Other emphysema ([place]) 06/26/2017   Coronary atherosclerosis 07/24/2015   Atherosclerosis of aorta ([place]) 07/24/2015   Dysphagia 07/24/2015   LOW BACK PAIN, CHRONIC 10/01/2009   Hypothyroidism 07/23/2009   Anxiety and depression 07/23/2009   Essential hypertension 07/23/2009   GERD 07/23/2009   PAST MEDICAL HISTORY:  Active Ambulatory Problems    Diagnosis Date Noted   Hypothyroidism 07/23/2009   Anxiety and depression 07/23/2009   Essential hypertension 07/23/2009   GERD 07/23/2009   LOW BACK PAIN, CHRONIC 10/01/2009  Coronary atherosclerosis 07/24/2015   Atherosclerosis of aorta ([place]) 07/24/2015   Dysphagia 07/24/2015   Other emphysema ([place]) 06/26/2017   Malnutrition of moderate degree 01/29/2018   Malignant neoplasm of lower lobe of right lung ([place]) 11/16/2018   Fall at home, initial encounter 04/22/2019   PHN (postherpetic neuralgia) 05/09/2020   Goals of care, counseling/discussion 10/10/2020   Iron deficiency anemia 04/10/2021   Insomnia secondary to chronic pain 06/24/2021   Primary osteoarthritis of left hip 06/24/2021   Therapeutic opioid-induced constipation (OIC) 06/24/2021   Chronic idiopathic constipation 07/01/2021   Ventral hernia without obstruction or gangrene  03/25/2022   OAB (overactive bladder) 06/27/2022   Encounter for general adult medical examination with abnormal findings 06/30/2022   Encounter for palliative care involving management of pain 06/30/2022   Need for prophylactic vaccination and inoculation against varicella 07/01/2022   Right-sided chest pain 07/22/2022   Pneumonia 07/22/2022   Generalized weakness 12/04/2022   Urinary frequency 12/04/2022   Hypokalemia 12/04/2022   Brain mass 01/06/2023   Vasogenic brain edema ([place]) 01/06/2023   Lung cancer metastatic to brain ([place]) 01/06/2023   Resolved Ambulatory Problems    Diagnosis Date Noted   Dyslipidemia 07/23/2009   Anxiety 07/23/2009   DIVERTICULOSIS, COLON 07/23/2009   UTI 01/07/2010   HIP PAIN, LEFT 05/16/2010   PARESTHESIA 10/01/2009   Headache(784.0) 07/23/2009   URI 12/31/2010   Pedal edema 05/16/2011   Syncope 08/13/2014   UTI (lower urinary tract infection) 08/13/2014   Rhabdomyolysis 08/13/2014   Acute respiratory failure with hypoxia ([place]) 07/23/2015   Acute respiratory failure ([place]) 07/23/2015   Bronchospasm with bronchitis, acute 07/23/2015   COPD exacerbation ([place]) 05/29/2016   Hypoxia 05/29/2016   COPD with acute exacerbation ([place]) 01/28/2018   Oral thrush 01/29/2018   Hypokalemia 02/19/2018   Abdominal pain 02/27/2018   Colitis 02/27/2018   SBO (small bowel obstruction) ([facility]) 02/27/2018   Dehydration    Elevated CK 04/22/2019   Leukocytosis 04/22/2019   Syncope and collapse 02/03/2020   Non-recurrent acute suppurative otitis media of both ears without spontaneous rupture of tympanic membranes 09/03/2021   Adenopathy 09/20/2021   Past Medical History:  Diagnosis Date   Anemia    ANXIETY 07/23/2009   Arthritis    Complication of anesthesia 7 or 8 yrs ago   COPD (chronic obstructive pulmonary disease) ([place])    DEPRESSION 07/23/2009   Dyspnea    Hemorrhoids    History of Crohn's disease    HYPERLIPIDEMIA 07/23/2009   HYPERTENSION  07/23/2009   HYPOTHYROIDISM 07/23/2009   IBS (irritable bowel syndrome)    Insomnia    lung ca dx'd 2019   Shingles 2006   SOCIAL HX:  Social History   Tobacco Use   Smoking status: Former    Packs/day: 0.50    Types: Cigarettes    Start date: 07/16/1960    Quit date: 07/16/1998    Years since quitting: 24.5   Smokeless tobacco: Never  Substance Use Topics   Alcohol use: No    Alcohol/week: 0.0 standard drinks of alcohol   FAMILY HX:  Family History  Problem Relation Age of Onset   Cancer Mother        Unknown type   Cancer Sister        Breast   Clotting disorder Neg Hx        Preferred Pharmacy: ALLERGIES:  Allergies  Allergen Reactions   Tylenol [Acetaminophen] Swelling and Other (See Comments)    Nightmares, tongue swelling  PERTINENT MEDICATIONS:  Outpatient Encounter Medications as of 01/30/2023  Medication Sig   Albuterol Sulfate (PROAIR RESPICLICK) 123XX123 (90 Base) MCG/ACT AEPB Inhale 2 puffs into the lungs 4 (four) times daily as needed. (Patient taking differently: Inhale 2 puffs into the lungs 4 (four) times daily as needed (shortness of breath).)   BREO ELLIPTA 200-25 MCG/ACT AEPB Inhale 1 puff into the lungs daily.   dexamethasone (DECADRON) 4 MG tablet Take 1 tablet (4 mg total) by mouth every 8 (eight) hours. Until seen by radiation oncology   Dextromethorphan-guaiFENesin (MUCINEX DM) 30-600 MG TB12 Take 1 tablet by mouth 2 (two) times daily. (Patient taking differently: Take 1 tablet by mouth 2 (two) times daily as needed (cough).)   docusate sodium (COLACE) 250 MG capsule Take 250 mg by mouth daily.   doxepin (SINEQUAN) 10 MG capsule TAKE ONE CAPSULE BY MOUTH AT BEDTIME AS NEEDED (Patient taking differently: Take 10 mg by mouth at bedtime.)   esomeprazole (NEXIUM) 40 MG capsule TAKE ONE CAPSULE BY MOUTH DAILY (Patient taking differently: Take 40 mg by mouth daily.)   folic acid (FOLVITE) 1 MG tablet TAKE ONE TABLET BY MOUTH DAILY (Patient taking  differently: Take 1 mg by mouth daily.)   levETIRAcetam (KEPPRA) 500 MG tablet Take 1 tablet (500 mg total) by mouth 2 (two) times daily.   levothyroxine (SYNTHROID) 25 MCG tablet TAKE ONE TABLET BY MOUTH DAILY BEFORE BREAKFAST (Patient taking differently: Take 25 mcg by mouth daily before breakfast.)   losartan (COZAAR) 25 MG tablet TAKE ONE TABLET BY MOUTH DAILY HIGH BLOOD PRESSURE (Patient taking differently: Take 25 mg by mouth daily.)   melatonin 3 MG TABS tablet Take 1 tablet (3 mg total) by mouth at bedtime.   ondansetron (ZOFRAN) 4 MG tablet Take 1 tablet (4 mg total) by mouth every 6 (six) hours as needed for nausea.   oxybutynin (DITROPAN-XL) 5 MG 24 hr tablet TAKE ONE TABLET BY MOUTH IN THE MORNING FOR BLADDER CONTROL (Patient taking differently: Take 5 mg by mouth daily.)   Oxycodone HCl 10 MG TABS Take 1 tablet (10 mg total) by mouth every 8 (eight) hours as needed.   Plecanatide (TRULANCE) 3 MG TABS Take 1 tablet by mouth daily. (Patient taking differently: Take 3 mg by mouth daily.)   polyvinyl alcohol (LIQUIFILM TEARS) 1.4 % ophthalmic solution Place 1 drop into both eyes as needed for dry eyes.   promethazine (PHENERGAN) 25 MG tablet Take 1 tablet (25 mg total) by mouth every 8 (eight) hours as needed for refractory nausea / vomiting.   RELISTOR 150 MG TABS TAKE THREE TABLETS BY MOUTH DAILY (Patient taking differently: Take 450 mg by mouth daily.)   sertraline (ZOLOFT) 50 MG tablet TAKE ONE TABLET BY MOUTH EVERY DAY (Patient taking differently: 50 mg daily.)   vitamin B-12 (CYANOCOBALAMIN) 50 MCG tablet Take 50 mcg by mouth daily.   VITAMIN D-1000 MAX ST 25 MCG (1000 UT) tablet Take 1,000 Units by mouth daily.   No facility-administered encounter medications on file as of 01/30/2023.    History obtained from review of EMR, discussion with facility staff/caregiver and/or patient.   CBC    Component Value Date/Time   WBC 8.0 01/07/2023 0529   RBC 3.56 (L) 01/07/2023 0529   HGB  10.6 (L) 01/07/2023 0529   HGB 12.7 12/16/2021 1501   HCT 33.8 (L) 01/07/2023 0529   PLT 237 01/07/2023 0529   PLT 283 12/16/2021 1501   MCV 94.9 01/07/2023 0529   MCH  29.8 01/07/2023 0529   MCHC 31.4 01/07/2023 0529   RDW 13.6 01/07/2023 0529   LYMPHSABS 0.7 01/07/2023 0529   MONOABS 0.3 01/07/2023 0529   EOSABS 0.0 01/07/2023 0529   BASOSABS 0.0 01/07/2023 0529    CMP    Latest Ref Rng & Units 01/13/2023    4:34 AM 01/07/2023    5:29 AM 01/06/2023    5:38 PM  CMP  Glucose 70 - 99 mg/dL  166  112   BUN 8 - 23 mg/dL  11  12   Creatinine 0.44 - 1.00 mg/dL 0.81  0.92  0.97   Sodium 135 - 145 mmol/L  135  136   Potassium 3.5 - 5.1 mmol/L  3.7  3.2   Chloride 98 - 111 mmol/L  102  100   CO2 22 - 32 mmol/L  22  25   Calcium 8.9 - 10.3 mg/dL  8.5  9.0   Total Protein 6.5 - 8.1 g/dL  6.3  7.6   Total Bilirubin 0.3 - 1.2 mg/dL  1.0  1.5   Alkaline Phos 38 - 126 U/L  51  64   AST 15 - 41 U/L  24  39   ALT 0 - 44 U/L  13  16     LFTs    Latest Ref Rng & Units 01/07/2023    5:29 AM 01/06/2023    5:38 PM 12/04/2022    3:35 PM  Hepatic Function  Total Protein 6.5 - 8.1 g/dL 6.3  7.6  7.3   Albumin 3.5 - 5.0 g/dL 3.0  3.5  4.2   AST 15 - 41 U/L 24  39  16   ALT 0 - 44 U/L '13  16  8   '$ Alk Phosphatase 38 - 126 U/L 51  64  69   Total Bilirubin 0.3 - 1.2 mg/dL 1.0  1.5  0.6   Bilirubin, Direct 0.0 - 0.3 mg/dL   0.1     Urinalysis    Component Value Date/Time   COLORURINE YELLOW 01/06/2023 1805   APPEARANCEUR CLEAR 01/06/2023 1805   LABSPEC 1.011 01/06/2023 1805   PHURINE 7.0 01/06/2023 1805   GLUCOSEU NEGATIVE 01/06/2023 1805   GLUCOSEU NEGATIVE 12/04/2022 1535   HGBUR SMALL (A) 01/06/2023 1805   HGBUR trace-lysed 11/15/2010 1046   BILIRUBINUR NEGATIVE 01/06/2023 1805   KETONESUR NEGATIVE 01/06/2023 1805   PROTEINUR NEGATIVE 01/06/2023 1805   UROBILINOGEN 0.2 12/04/2022 1535   NITRITE NEGATIVE 01/06/2023 1805   LEUKOCYTESUR NEGATIVE 01/06/2023 1805   01/06/23 CT brain  and cervical spine: IMPRESSION: 1. Intraparenchymal mass in the left frontal lobe that measures 3.0 x 3.3 cm  with moderate surrounding vasogenic edema and 2 mm rightward midline shift. MRI with and without contrast is recommended for further characterization. 2. No acute fracture or static subluxation of the cervical spine.  01/06/23 CT chest, abdomen and pelvis:  IMPRESSION: Relatively stable appearance to the right hilar lymph node enlargement and soft tissue mass extending into the right lower lobe consistent with known history of lung cancer.   Increasing opacity along the inferior middle lobe. Recommend follow-up.   No pneumothorax or effusion.   No bowel obstruction, free air or free fluid. No evidence of solid organ injury.   Colonic diverticulosis.   Stable compression of L2 with augmentation cement.  I reviewed available labs, medications, imaging, studies and related documents from the EMR. Records reviewed and summarized above.   Physical Exam: GENERAL: NAD LUNGS: CTAB, no increased  work of breathing, room air CARDIAC:  S1S2, RRR with no MRG, No edema, No cyanosis ABD:  Hypo-active BS x 4 quads, soft, non-tender EXTREMITIES: No muscle atrophy/subcutaneous fat loss NEURO:  Noted cognitive impairment, some psychomotor retardation and question some aphasia unclear if expressive and/or receptive. PSYCH:  non-anxious affect, A & O to self.  Intermittently irritable  Thank you for the opportunity to participate in the care of [facility]. Please call our main office at [phone] if we can be of additional assistance.    Damaris Hippo FNP-C  Nahsir [email or web address] .[facility]  Phone:  [phone]

## 2023-01-31 DIAGNOSIS — Z515 Encounter for palliative care: Secondary | ICD-10-CM | POA: Insufficient documentation

## 2023-01-31 DIAGNOSIS — Z9181 History of falling: Secondary | ICD-10-CM | POA: Insufficient documentation

## 2023-02-02 ENCOUNTER — Telehealth: Payer: Self-pay | Admitting: Radiation Oncology

## 2023-02-02 DIAGNOSIS — K219 Gastro-esophageal reflux disease without esophagitis: Secondary | ICD-10-CM | POA: Diagnosis not present

## 2023-02-02 DIAGNOSIS — J449 Chronic obstructive pulmonary disease, unspecified: Secondary | ICD-10-CM | POA: Diagnosis not present

## 2023-02-02 DIAGNOSIS — C7931 Secondary malignant neoplasm of brain: Secondary | ICD-10-CM | POA: Diagnosis not present

## 2023-02-02 NOTE — Telephone Encounter (Signed)
I called and spoke with the patient's son but had to leave a voicemail asking him to call back to clarify if his mom will proceed with treatment, or if plans are now including hospice care.

## 2023-02-05 DIAGNOSIS — B372 Candidiasis of skin and nail: Secondary | ICD-10-CM | POA: Diagnosis not present

## 2023-02-05 DIAGNOSIS — K219 Gastro-esophageal reflux disease without esophagitis: Secondary | ICD-10-CM | POA: Diagnosis not present

## 2023-02-05 DIAGNOSIS — J449 Chronic obstructive pulmonary disease, unspecified: Secondary | ICD-10-CM | POA: Diagnosis not present

## 2023-02-05 DIAGNOSIS — I10 Essential (primary) hypertension: Secondary | ICD-10-CM | POA: Diagnosis not present

## 2023-02-05 DIAGNOSIS — C7931 Secondary malignant neoplasm of brain: Secondary | ICD-10-CM | POA: Diagnosis not present

## 2023-02-13 DIAGNOSIS — C3490 Malignant neoplasm of unspecified part of unspecified bronchus or lung: Secondary | ICD-10-CM | POA: Diagnosis not present

## 2023-02-13 DIAGNOSIS — J449 Chronic obstructive pulmonary disease, unspecified: Secondary | ICD-10-CM | POA: Diagnosis not present

## 2023-02-13 DIAGNOSIS — C7931 Secondary malignant neoplasm of brain: Secondary | ICD-10-CM | POA: Diagnosis not present

## 2023-02-13 DIAGNOSIS — I10 Essential (primary) hypertension: Secondary | ICD-10-CM | POA: Diagnosis not present

## 2023-02-16 ENCOUNTER — Other Ambulatory Visit: Payer: Self-pay | Admitting: *Deleted

## 2023-02-17 DIAGNOSIS — C7931 Secondary malignant neoplasm of brain: Secondary | ICD-10-CM | POA: Diagnosis not present

## 2023-02-17 DIAGNOSIS — K219 Gastro-esophageal reflux disease without esophagitis: Secondary | ICD-10-CM | POA: Diagnosis not present

## 2023-02-17 DIAGNOSIS — I10 Essential (primary) hypertension: Secondary | ICD-10-CM | POA: Diagnosis not present

## 2023-02-17 DIAGNOSIS — J449 Chronic obstructive pulmonary disease, unspecified: Secondary | ICD-10-CM | POA: Diagnosis not present

## 2023-02-23 ENCOUNTER — Other Ambulatory Visit: Payer: Self-pay | Admitting: Internal Medicine

## 2023-02-23 DIAGNOSIS — E039 Hypothyroidism, unspecified: Secondary | ICD-10-CM

## 2023-02-23 DIAGNOSIS — K219 Gastro-esophageal reflux disease without esophagitis: Secondary | ICD-10-CM

## 2023-02-23 DIAGNOSIS — N3281 Overactive bladder: Secondary | ICD-10-CM

## 2023-02-23 DIAGNOSIS — I2510 Atherosclerotic heart disease of native coronary artery without angina pectoris: Secondary | ICD-10-CM

## 2023-02-23 DIAGNOSIS — E440 Moderate protein-calorie malnutrition: Secondary | ICD-10-CM

## 2023-02-23 DIAGNOSIS — I10 Essential (primary) hypertension: Secondary | ICD-10-CM

## 2023-02-23 DIAGNOSIS — F419 Anxiety disorder, unspecified: Secondary | ICD-10-CM

## 2023-02-25 ENCOUNTER — Ambulatory Visit: Payer: Self-pay

## 2023-02-25 NOTE — Patient Outreach (Signed)
  Care Coordination   Follow Up Visit Note   02/25/2023 Name: Heather [name] MRN: [id number] DOB: 1935/02/06  Heather [name] is a 88 y.o. year old female who sees Heather Lima, MD for primary care. I spoke with patient son Heather [name]) by phone today.  What matters to the patients health and wellness today?  Mr. Heather [name] confirms that patient is still in skilled care. He reports patient may remain at skilled facility ling term. He states patient is with palliative care now, but may transition to hospice care. He reports Heather [name] has been diagnosed with Brain Cancer and states that he is working with department of social services to obtain Medicaid services so patient can remain at skilled facility.  Goals Addressed             This Visit's Progress    Care Coordination Activities       Interventions Today    Flowsheet Row Most Recent Value  Chronic Disease   Chronic disease during today's visit Other  General Interventions   General Interventions Discussed/Reviewed General Interventions Reviewed, Communication with  Communication with --  [Acute care nurse, Heather [name], RNCM]  Education Interventions   Provided Verbal Education On Other             SDOH assessments and interventions completed:  No  Care Coordination Interventions:  Yes, provided   Follow up plan:  continue to follow pending patient progress. acute care nurse to notify RNCM of patient disposition    Encounter Outcome:  Pt. Visit Completed   Thea Silversmith, RN, MSN, BSN, [place] Coordinator [phone]

## 2023-02-25 NOTE — Patient Instructions (Addendum)
Visit Information  Thank you for taking time to visit with me today. Please don't hesitate to contact me if I can be of assistance to you.   Following are the goals we discussed today:   Goals Addressed             This Visit's Progress    Care Coordination Activities       Interventions Today    Flowsheet Row Most Recent Value  Chronic Disease   Chronic disease during today's visit Other  General Interventions   General Interventions Discussed/Reviewed General Interventions Reviewed, Communication with  Communication with --  [Acute care nurse, [name], RNCM]  Education Interventions   Provided Verbal Education On Other             Please call the care guide team at [phone] if you need to cancel or schedule your appointment.   If you are experiencing a Mental Health or [place] or need someone to talk to, please   Thea Silversmith, RN, MSN, BSN, [place] Coordinator [phone]

## 2023-02-26 ENCOUNTER — Other Ambulatory Visit: Payer: Self-pay | Admitting: *Deleted

## 2023-02-26 NOTE — Patient Outreach (Signed)
Mrs. Liddell resides in [facility] SNF. Active with [facility] Care Coordination team prior.  Attempted outreaches (by phone and secure email) made to Florentina Jenny, [employer] social worker, to inquire about transition plans.   Will update [facility] RN care coordinator once information received.  [name], MSN, RN,BSN [place] Acute Care Coordinator [phone] (Direct dial)

## 2023-03-04 DIAGNOSIS — I10 Essential (primary) hypertension: Secondary | ICD-10-CM | POA: Diagnosis not present

## 2023-03-04 DIAGNOSIS — J449 Chronic obstructive pulmonary disease, unspecified: Secondary | ICD-10-CM | POA: Diagnosis not present

## 2023-03-04 DIAGNOSIS — C7931 Secondary malignant neoplasm of brain: Secondary | ICD-10-CM | POA: Diagnosis not present

## 2023-03-17 DIAGNOSIS — E039 Hypothyroidism, unspecified: Secondary | ICD-10-CM | POA: Diagnosis not present

## 2023-03-24 DIAGNOSIS — C3431 Malignant neoplasm of lower lobe, right bronchus or lung: Secondary | ICD-10-CM | POA: Diagnosis not present

## 2023-03-24 DIAGNOSIS — C7931 Secondary malignant neoplasm of brain: Secondary | ICD-10-CM | POA: Diagnosis not present

## 2023-03-24 DIAGNOSIS — J449 Chronic obstructive pulmonary disease, unspecified: Secondary | ICD-10-CM | POA: Diagnosis not present

## 2023-03-26 DIAGNOSIS — K219 Gastro-esophageal reflux disease without esophagitis: Secondary | ICD-10-CM | POA: Diagnosis not present

## 2023-03-26 DIAGNOSIS — J449 Chronic obstructive pulmonary disease, unspecified: Secondary | ICD-10-CM | POA: Diagnosis not present

## 2023-03-26 DIAGNOSIS — C7931 Secondary malignant neoplasm of brain: Secondary | ICD-10-CM | POA: Diagnosis not present

## 2023-03-26 DIAGNOSIS — C3431 Malignant neoplasm of lower lobe, right bronchus or lung: Secondary | ICD-10-CM | POA: Diagnosis not present

## 2023-03-30 ENCOUNTER — Encounter: Payer: Medicare Other | Admitting: Physical Medicine and Rehabilitation

## 2023-03-31 ENCOUNTER — Other Ambulatory Visit: Payer: Self-pay | Admitting: *Deleted

## 2023-03-31 NOTE — Patient Outreach (Signed)
[facility] Post- Acute Care Coordinator follow up.  Heather [name] resides in  [facility] SNF. Heather [name] was active with [facility] care coordination team prior.   Telephone call made to [facility]. Spoke with business office. Heather [name] resides under long term care.   No identifiable [facility] care coordination services at this time.      Raiford Noble, MSN, RN,BSN [facility] Post Acute Care Coordinator [phone] [facility]) [phone]  (Toll free office)

## 2023-04-02 DIAGNOSIS — K219 Gastro-esophageal reflux disease without esophagitis: Secondary | ICD-10-CM | POA: Diagnosis not present

## 2023-04-02 DIAGNOSIS — R059 Cough, unspecified: Secondary | ICD-10-CM | POA: Diagnosis not present

## 2023-04-02 DIAGNOSIS — I10 Essential (primary) hypertension: Secondary | ICD-10-CM | POA: Diagnosis not present

## 2023-04-02 DIAGNOSIS — J449 Chronic obstructive pulmonary disease, unspecified: Secondary | ICD-10-CM | POA: Diagnosis not present

## 2023-04-03 DIAGNOSIS — I10 Essential (primary) hypertension: Secondary | ICD-10-CM | POA: Diagnosis not present

## 2023-04-03 DIAGNOSIS — C7931 Secondary malignant neoplasm of brain: Secondary | ICD-10-CM | POA: Diagnosis not present

## 2023-04-03 DIAGNOSIS — C3490 Malignant neoplasm of unspecified part of unspecified bronchus or lung: Secondary | ICD-10-CM | POA: Diagnosis not present

## 2023-04-03 DIAGNOSIS — J449 Chronic obstructive pulmonary disease, unspecified: Secondary | ICD-10-CM | POA: Diagnosis not present

## 2023-07-31 ENCOUNTER — Ambulatory Visit: Payer: Self-pay

## 2023-07-31 NOTE — Patient Outreach (Signed)
  Care Coordination   Case closed  Visit Note   07/31/2023 Name: Heather [name] MRN: [id number] DOB: Jun 09, 1935  ILY QUINLEY is a 88 y.o. year old female who sees Etta Grandchild, MD for primary care.   Per review of chart, patient residing in skilled facility under long term care.  Goals Addressed             This Visit's Progress    COMPLETED: Care Coordination Activities       Goals Closed: Interventions Today    Flowsheet Row Most Recent Value  General Interventions   General Interventions Discussed/Reviewed General Interventions Reviewed  [per review of chart, patient residing in skilled facility under long term care.]             SDOH assessments and interventions completed:  No  Care Coordination Interventions:  No, not indicated   Follow up plan: No further intervention required.   Encounter Outcome:  Pt. Visit Completed   Kathyrn Sheriff, RN, MSN, BSN, CCM Care Coordinator [phone]

## 2024-06-07 DEATH — deceased
